# Patient Record
Sex: Female | Born: 1945 | Hispanic: No | Marital: Single | Smoking: Former smoker
Health system: Southern US, Community
[De-identification: ages and names within clinical notes are randomized; demographics above are authoritative.]

## PROBLEM LIST (undated history)

## (undated) DIAGNOSIS — F419 Anxiety disorder, unspecified: Secondary | ICD-10-CM

## (undated) DIAGNOSIS — I739 Peripheral vascular disease, unspecified: Secondary | ICD-10-CM

## (undated) DIAGNOSIS — M1990 Unspecified osteoarthritis, unspecified site: Secondary | ICD-10-CM

## (undated) DIAGNOSIS — R52 Pain, unspecified: Secondary | ICD-10-CM

## (undated) DIAGNOSIS — J449 Chronic obstructive pulmonary disease, unspecified: Secondary | ICD-10-CM

## (undated) DIAGNOSIS — R011 Cardiac murmur, unspecified: Secondary | ICD-10-CM

## (undated) DIAGNOSIS — Z87442 Personal history of urinary calculi: Secondary | ICD-10-CM

## (undated) DIAGNOSIS — G709 Myoneural disorder, unspecified: Secondary | ICD-10-CM

## (undated) DIAGNOSIS — D649 Anemia, unspecified: Secondary | ICD-10-CM

## (undated) DIAGNOSIS — Z5189 Encounter for other specified aftercare: Secondary | ICD-10-CM

## (undated) DIAGNOSIS — R51 Headache: Secondary | ICD-10-CM

## (undated) DIAGNOSIS — K316 Fistula of stomach and duodenum: Secondary | ICD-10-CM

## (undated) DIAGNOSIS — J189 Pneumonia, unspecified organism: Secondary | ICD-10-CM

## (undated) DIAGNOSIS — M797 Fibromyalgia: Secondary | ICD-10-CM

## (undated) DIAGNOSIS — F32A Depression, unspecified: Secondary | ICD-10-CM

## (undated) DIAGNOSIS — M810 Age-related osteoporosis without current pathological fracture: Secondary | ICD-10-CM

## (undated) DIAGNOSIS — I2699 Other pulmonary embolism without acute cor pulmonale: Secondary | ICD-10-CM

## (undated) DIAGNOSIS — K219 Gastro-esophageal reflux disease without esophagitis: Secondary | ICD-10-CM

## (undated) DIAGNOSIS — J40 Bronchitis, not specified as acute or chronic: Secondary | ICD-10-CM

## (undated) DIAGNOSIS — F329 Major depressive disorder, single episode, unspecified: Secondary | ICD-10-CM

## (undated) DIAGNOSIS — T7840XA Allergy, unspecified, initial encounter: Secondary | ICD-10-CM

## (undated) HISTORY — PX: SPINE SURGERY: SHX786

## (undated) HISTORY — PX: OVARIAN CYST REMOVAL: SHX89

## (undated) HISTORY — DX: Major depressive disorder, single episode, unspecified: F32.9

## (undated) HISTORY — DX: Depression, unspecified: F32.A

## (undated) HISTORY — PX: EYE SURGERY: SHX253

## (undated) HISTORY — PX: OTHER SURGICAL HISTORY: SHX169

## (undated) HISTORY — DX: Anemia, unspecified: D64.9

## (undated) HISTORY — PX: POLYPECTOMY: SHX149

## (undated) HISTORY — PX: ABDOMINAL HYSTERECTOMY: SHX81

## (undated) HISTORY — DX: Anxiety disorder, unspecified: F41.9

## (undated) HISTORY — DX: Encounter for other specified aftercare: Z51.89

## (undated) HISTORY — PX: UPPER GASTROINTESTINAL ENDOSCOPY: SHX188

## (undated) HISTORY — PX: APPENDECTOMY: SHX54

## (undated) HISTORY — PX: COLONOSCOPY: SHX174

## (undated) HISTORY — DX: Myoneural disorder, unspecified: G70.9

## (undated) HISTORY — DX: Age-related osteoporosis without current pathological fracture: M81.0

## (undated) HISTORY — PX: HERNIA REPAIR: SHX51

## (undated) HISTORY — DX: Allergy, unspecified, initial encounter: T78.40XA

---

## 1998-11-21 ENCOUNTER — Ambulatory Visit (HOSPITAL_COMMUNITY): Admission: RE | Admit: 1998-11-21 | Discharge: 1998-11-21 | Payer: Self-pay | Admitting: Ophthalmology

## 1999-03-01 ENCOUNTER — Encounter: Admission: RE | Admit: 1999-03-01 | Discharge: 1999-03-01 | Payer: Self-pay | Admitting: Internal Medicine

## 1999-03-01 ENCOUNTER — Encounter: Payer: Self-pay | Admitting: Internal Medicine

## 2000-03-07 ENCOUNTER — Encounter: Admission: RE | Admit: 2000-03-07 | Discharge: 2000-03-07 | Payer: Self-pay | Admitting: Internal Medicine

## 2000-03-07 ENCOUNTER — Encounter: Payer: Self-pay | Admitting: Internal Medicine

## 2000-03-27 ENCOUNTER — Ambulatory Visit (HOSPITAL_COMMUNITY): Admission: RE | Admit: 2000-03-27 | Discharge: 2000-03-27 | Payer: Self-pay | Admitting: Orthopedic Surgery

## 2000-03-27 ENCOUNTER — Encounter: Payer: Self-pay | Admitting: Orthopedic Surgery

## 2001-03-12 ENCOUNTER — Encounter: Admission: RE | Admit: 2001-03-12 | Discharge: 2001-03-12 | Payer: Self-pay | Admitting: Occupational Medicine

## 2001-03-12 ENCOUNTER — Encounter: Payer: Self-pay | Admitting: Occupational Medicine

## 2002-03-13 ENCOUNTER — Encounter: Admission: RE | Admit: 2002-03-13 | Discharge: 2002-03-13 | Payer: Self-pay | Admitting: Occupational Medicine

## 2002-03-13 ENCOUNTER — Encounter: Payer: Self-pay | Admitting: Occupational Medicine

## 2002-05-26 ENCOUNTER — Ambulatory Visit (HOSPITAL_COMMUNITY): Admission: RE | Admit: 2002-05-26 | Discharge: 2002-05-26 | Payer: Self-pay | Admitting: *Deleted

## 2002-05-26 ENCOUNTER — Encounter (INDEPENDENT_AMBULATORY_CARE_PROVIDER_SITE_OTHER): Payer: Self-pay | Admitting: Specialist

## 2002-05-27 ENCOUNTER — Encounter: Payer: Self-pay | Admitting: Emergency Medicine

## 2002-05-27 ENCOUNTER — Emergency Department (HOSPITAL_COMMUNITY): Admission: EM | Admit: 2002-05-27 | Discharge: 2002-05-27 | Payer: Self-pay | Admitting: Emergency Medicine

## 2002-07-20 ENCOUNTER — Ambulatory Visit (HOSPITAL_COMMUNITY): Admission: RE | Admit: 2002-07-20 | Discharge: 2002-07-20 | Payer: Self-pay | Admitting: Internal Medicine

## 2002-07-20 ENCOUNTER — Encounter: Payer: Self-pay | Admitting: Internal Medicine

## 2003-05-26 ENCOUNTER — Encounter: Admission: RE | Admit: 2003-05-26 | Discharge: 2003-05-26 | Payer: Self-pay | Admitting: Occupational Medicine

## 2004-09-05 ENCOUNTER — Encounter: Admission: RE | Admit: 2004-09-05 | Discharge: 2004-09-05 | Payer: Self-pay | Admitting: Occupational Medicine

## 2004-10-05 ENCOUNTER — Encounter: Admission: RE | Admit: 2004-10-05 | Discharge: 2004-10-05 | Payer: Self-pay | Admitting: Internal Medicine

## 2004-11-17 ENCOUNTER — Ambulatory Visit (HOSPITAL_COMMUNITY): Admission: RE | Admit: 2004-11-17 | Discharge: 2004-11-18 | Payer: Self-pay | Admitting: Orthopaedic Surgery

## 2005-06-18 ENCOUNTER — Ambulatory Visit: Payer: Self-pay | Admitting: Gastroenterology

## 2006-01-02 ENCOUNTER — Encounter: Admission: RE | Admit: 2006-01-02 | Discharge: 2006-01-02 | Payer: Self-pay | Admitting: Occupational Medicine

## 2008-09-27 LAB — LIPID PANEL
Cholesterol: 204 mg/dL — AB (ref 0–200)
LDL Cholesterol: 82 mg/dL

## 2008-09-27 LAB — TSH: TSH: 2.26 u[IU]/mL (ref <=5.90)

## 2009-07-05 ENCOUNTER — Encounter (INDEPENDENT_AMBULATORY_CARE_PROVIDER_SITE_OTHER): Payer: Self-pay | Admitting: *Deleted

## 2009-08-24 ENCOUNTER — Encounter: Admission: RE | Admit: 2009-08-24 | Discharge: 2009-08-24 | Payer: Self-pay | Admitting: Family Medicine

## 2010-02-27 ENCOUNTER — Encounter: Admission: RE | Admit: 2010-02-27 | Discharge: 2010-02-27 | Payer: Self-pay | Admitting: Internal Medicine

## 2010-05-14 ENCOUNTER — Encounter: Payer: Self-pay | Admitting: Occupational Medicine

## 2010-05-23 NOTE — Letter (Signed)
Summary: Referral - not able to see patient  [facility]  [address] [place], [place] [address]   Phone: [phone]  Fax: [phone]    July 05, 2009        Dr. Elvina Sidle [address] [place], [place]  [address]           Re:   Cynthia Bowen DOB:  1945-10-25 MRN:   [id number]    Dear Dr. Milus Glazier:  Thank you for your kind referral of the above patient.  We have attempted to schedule the recommended procedure, colonoscopy, but have not been able to schedule because:  _x_ The patient was not available by phone and/or has not returned our calls.  ___ The patient declined to schedule the procedure at this time.  We appreciate the referral and hope that we will have the opportunity to treat this patient in the future.    Sincerely,    [facility] [phone]

## 2010-07-19 LAB — HEPATIC FUNCTION PANEL: AST: 64 U/L — AB (ref 13–35)

## 2010-07-19 LAB — BASIC METABOLIC PANEL: Potassium: 3.2 mmol/L — AB (ref 3.4–5.3)

## 2010-07-19 LAB — LIPID PANEL
Cholesterol: 156 mg/dL (ref 0–200)
LDL Cholesterol: 35 mg/dL

## 2010-09-08 NOTE — Op Note (Signed)
   NAME:  Cynthia Bowen, Cynthia Bowen                          ACCOUNT NO.:  [id number]   MEDICAL RECORD NO.:  [id number]                   PATIENT TYPE:  AMB   LOCATION:  ENDO                                 FACILITY:  [facility]   PHYSICIAN:  Georgiana Spinner, M.D.                 DATE OF BIRTH:  Jan 26, 1946   DATE OF PROCEDURE:  DATE OF DISCHARGE:                                 OPERATIVE REPORT   PROCEDURE:  Upper endoscopy.   INDICATIONS:  Abdominal pain.   ANESTHESIA:  Demerol 90, Versed 9 mg.   PROCEDURE:  With the patient mildly sedated, in the left lateral decubitus  position, the Olympus videoscopic endoscope was inserted through the mouth,  passed under direct vision, through the esophagus which showed changes of  esophagitis which were photographed and biopsied.  We entered into the  stomach, fundus, body, antrum, duodenal bulb, second portion of the duodenum  and all appeared normal.  From this point, the endoscope was slowly  withdrawn, taking circumferential views of the entire duodenal mucosal until  the endoscope had been pulled back into the stomach, placed in retroflexed  position.  The endoscope was then straightened and withdrawn taking several  views of the remaining gastric and esophageal mucosa.  The patient's vital  signs and pulse oximeter remained stable, patient tolerated the procedure  well, without apparent complications.   FINDINGS:  Unremarkable examination other than the esophagitis, await biopsy  report.  Patient will call me for results in 12 days as an outpatient.   Proceed to colonoscopy as planned.                                               Georgiana Spinner, M.D.    GMO/MEDQ  D:  05/26/2002  T:  05/26/2002  Job:  [id number]   cc:   Otho Darner, M.D.  [address]  [place]  [place] [address]  Fax: [phone]

## 2010-09-08 NOTE — Op Note (Signed)
   NAME:  Cynthia Bowen, Cynthia Bowen                          ACCOUNT NO.:  [id number]   MEDICAL RECORD NO.:  [id number]                   PATIENT TYPE:  AMB   LOCATION:  ENDO                                 FACILITY:  [facility]   PHYSICIAN:  Georgiana Spinner, M.D.                 DATE OF BIRTH:  1946/04/02   DATE OF PROCEDURE:  05/26/2002  DATE OF DISCHARGE:                                 OPERATIVE REPORT   PROCEDURE:  Colonoscopy.   INDICATIONS:  Colon polyps.   ANESTHESIA:  Demerol 10, Versed 1 mg.   PROCEDURE IN DETAIL:  With the patient mildly sedated in the left lateral  decubitus position, the Olympus videoscopic colonoscope was inserted into  the rectum and passed under direct vision to the cecum, identified by the  ileocecal valve and the appendiceal orifice, both of which were  photographed.  In the cecum was a small polyp which was photographed and  removed using hot snare cautery technique.  A second polyp was seen adjacent  to this which was removed by using a hot biopsy forceps technique, both at  setting of 20/20 blended current.  The endoscope was then withdrawn taking  several different views of the remaining colonic mucosa, stopping to examine  the hepatic flexure, at which point a larger polyp was seen, photographed;  and as we manipulated, it was noted that it was on a stalk.  Therefore, it  was removed using snare cautery technique at a setting of 20/20 blended  current.  There was good hemostasis with all, and this last polyp was  retrieved using the [place] endoscopy net polyp retriever.  The endoscope was  withdrawn, as noted, all the way to the rectum which appeared normal  indirect and showed hemorrhoids on retroflexed view.  The endoscope was  straightened and withdrawn.  The patient's vital signs and pulse oximeter  remained stable.  The patient tolerated the procedure well without apparent  complications.   FINDINGS:  Polyps as described above, two in the cecum and one in  the  ascending colon near the hepatic flexure, otherwise unremarkable examination  other than hemorrhoids.   PLAN:  I will have patient call me for results of biopsies and follow up  with me as an outpatient.                                                Georgiana Spinner, M.D.    GMO/MEDQ  D:  05/26/2002  T:  05/26/2002  Job:  [id number]   cc:   Otho Darner, M.D.  [address]  [place]  [place] [address]  Fax: [phone]

## 2010-09-08 NOTE — Op Note (Signed)
Cynthia Bowen, Cynthia Bowen                ACCOUNT NO.:  [id number]   MEDICAL RECORD NO.:  [id number]          PATIENT TYPE:  OIB   LOCATION:  2550                         FACILITY:  [facility]   PHYSICIAN:  Mark C. Ophelia Charter, M.D.    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   PROCEDURE:  C4-5 anterior cervical diskectomy and fusion, left iliac crest,  bone graft and plating.   SURGEON:  Mark C. Ophelia Charter, M.D.   ASSISTANT:  Lynford Citizen, RNFA.   ANESTHESIA:  General plus local.   ESTIMATED BLOOD LOSS:  100 cc.   DRAINS:  One Hemovac.   PROCEDURE:  After adequate general anesthesia and orotracheal intubation,  standard prepping with sterile metal stand at the head.  Thyroid sheets were  applied.  Sterile skin mark in the old incision, and Betadine Viadrape in  both areas.  The incision was made starting in the midline, continuing on  the left, using the old incision and then dissection was performed,  following the L plane down the longus colli muscle, extending proximally to  the prominent spur.  Cross-table C-arm was used to document this at the C4-5  level.  Diskectomy was performed.  A self-retaining Cloward blades were  placed right and left, smooth blades up and down.  __drilling________ back  to the posterior cortex was performed with the 12 mm drill.  Posterior  longitudinal ligament was taken down.  Operative microscope was used.  Dura  was visualized.  There were chunks of disk that were pushed back behind the  vertebral body above and below.  Dura was well decompressed.  A 14 mm plug  was harvested from the left iliac crest and impacted to _flush position__.  Initially, the graft was a little bit at an angle.  It was removed and then  replaced.   Countersunk slightly with good position.  A 34 plate was  selected, a Synthes gold titanium.  It was adjusted, checked in AP and  lateral, then removed, bent, replaced.  Checked AP and lateral.  Holes were  power-drilled, 14 mm cancellous screws inserted, and then locking screws.  After irrigation with saline solution, Hemovacs were placed through a  separate stab incision with an in-and-out technique.  Platysma closed with 3-  0 Vicryl, 4-0  Vicryl, subcuticular and skin closure.  The iliac grafts closed with 0  Vicryl on the fascia and 2-0 in the subcutaneous tissue, 4-0 subcuticular  closure, and tincture of Benzoin and Steri-Strips, 4x4s, soft cervical  collar, and transfer to the recovery room in stable condition.  Instrument  and needle counts were correct.       MCY/MEDQ  D:  11/17/2004  T:  11/17/2004  Job:  [id number]

## 2011-04-02 ENCOUNTER — Encounter: Payer: Self-pay | Admitting: Internal Medicine

## 2011-04-02 DIAGNOSIS — M1990 Unspecified osteoarthritis, unspecified site: Secondary | ICD-10-CM | POA: Insufficient documentation

## 2011-04-02 DIAGNOSIS — M5090 Cervical disc disorder, unspecified, unspecified cervical region: Secondary | ICD-10-CM

## 2011-04-02 DIAGNOSIS — K219 Gastro-esophageal reflux disease without esophagitis: Secondary | ICD-10-CM | POA: Insufficient documentation

## 2011-04-02 DIAGNOSIS — M797 Fibromyalgia: Secondary | ICD-10-CM | POA: Insufficient documentation

## 2011-04-02 DIAGNOSIS — F418 Other specified anxiety disorders: Secondary | ICD-10-CM | POA: Insufficient documentation

## 2011-04-02 DIAGNOSIS — F329 Major depressive disorder, single episode, unspecified: Secondary | ICD-10-CM

## 2011-04-02 DIAGNOSIS — E785 Hyperlipidemia, unspecified: Secondary | ICD-10-CM

## 2011-04-11 ENCOUNTER — Ambulatory Visit (INDEPENDENT_AMBULATORY_CARE_PROVIDER_SITE_OTHER): Payer: Federal, State, Local not specified - PPO | Admitting: Internal Medicine

## 2011-04-11 DIAGNOSIS — F341 Dysthymic disorder: Secondary | ICD-10-CM

## 2011-04-11 DIAGNOSIS — Z23 Encounter for immunization: Secondary | ICD-10-CM

## 2011-04-11 DIAGNOSIS — G4700 Insomnia, unspecified: Secondary | ICD-10-CM

## 2011-04-21 ENCOUNTER — Ambulatory Visit (INDEPENDENT_AMBULATORY_CARE_PROVIDER_SITE_OTHER): Payer: Federal, State, Local not specified - PPO

## 2011-04-21 DIAGNOSIS — M25579 Pain in unspecified ankle and joints of unspecified foot: Secondary | ICD-10-CM

## 2011-04-21 DIAGNOSIS — M7660 Achilles tendinitis, unspecified leg: Secondary | ICD-10-CM

## 2011-04-25 ENCOUNTER — Ambulatory Visit (INDEPENDENT_AMBULATORY_CARE_PROVIDER_SITE_OTHER): Payer: Federal, State, Local not specified - PPO

## 2011-04-25 DIAGNOSIS — R319 Hematuria, unspecified: Secondary | ICD-10-CM

## 2011-04-25 DIAGNOSIS — M779 Enthesopathy, unspecified: Secondary | ICD-10-CM

## 2011-05-10 ENCOUNTER — Ambulatory Visit (INDEPENDENT_AMBULATORY_CARE_PROVIDER_SITE_OTHER): Payer: Federal, State, Local not specified - PPO

## 2011-05-10 DIAGNOSIS — N390 Urinary tract infection, site not specified: Secondary | ICD-10-CM

## 2011-05-22 ENCOUNTER — Telehealth: Payer: Self-pay

## 2011-05-22 NOTE — Telephone Encounter (Signed)
Spoke with patient, needs refill on a medication from [facility] on [address], but doesn't know which one and pharmacy is telling her that they are waiting on [place].  Per her chart, we denied Wellbutrin, pt d/c'd in Dec., and rf'd prozac.  Clarified with pharmacy that Prozac was ready, and to take Wellbutrin off her profile.  Patient notified rx was ready.

## 2011-05-22 NOTE — Telephone Encounter (Signed)
,  [facility] PATIENT SAID THE PHARMACY HAVE QUESTIONS REGARDING HER MEDICATION. SHE IS NOT SURE WHY?  BEST PHONE [phone]. [facility] [address].

## 2011-06-12 ENCOUNTER — Encounter: Payer: Self-pay | Admitting: Family Medicine

## 2011-06-13 ENCOUNTER — Ambulatory Visit (INDEPENDENT_AMBULATORY_CARE_PROVIDER_SITE_OTHER): Payer: Federal, State, Local not specified - PPO | Admitting: Internal Medicine

## 2011-06-13 ENCOUNTER — Encounter: Payer: Self-pay | Admitting: Internal Medicine

## 2011-06-13 VITALS — BP 128/80 | HR 109 | Temp 99.2°F | Resp 16 | Ht 65.0 in | Wt 102.0 lb

## 2011-06-13 DIAGNOSIS — F329 Major depressive disorder, single episode, unspecified: Secondary | ICD-10-CM

## 2011-06-13 MED ORDER — FLUOXETINE HCL 20 MG PO CAPS: 20.0000 mg | ORAL_CAPSULE | Freq: Every day | ORAL | Status: DC

## 2011-06-13 NOTE — Progress Notes (Signed)
  Subjective:    Patient ID: Cynthia Bowen, female    DOB: 03-Jun-1945, 66 y.o.   MRN: [id number]  HPIReturns for followup of depression and irritability with insomnia. We Increased Prozac in  November and 40 prozac= vomiting. She returned to 20 mg as now much better with regard to her depression symptoms. Halcion even half will be hungover- now uses only When can fall asleep for 1-2 hours. Uses is seldom and does well. [employer] Has her enrolled in a wellness planning program and she is keeping her exercise log on the computer.  License back In July if all goes well although she is now accustomed to walking a lot and is a little anxious about going back to driving  Review of SystemsUTI January 2013 responded well to medicines and has no further voiding symptoms Post cataract Surgery and still has problems with glare at night    Objective:   Physical ExamVital signs within normal limits Neurological intact Affect and mood appropriate        Assessment & Plan:  Problem #1 depression Problem #2 insomnia  Will continue Prozac 20 #90 one refill May call if needs more Halcion She continues to avoid all alcohol Will followup in August for her labs and complete exam

## 2011-08-09 ENCOUNTER — Telehealth: Payer: Self-pay

## 2011-08-09 DIAGNOSIS — H9209 Otalgia, unspecified ear: Secondary | ICD-10-CM

## 2011-08-09 NOTE — Telephone Encounter (Signed)
Can you please do this referral?

## 2011-08-09 NOTE — Telephone Encounter (Signed)
PT REQUEST THE REFERRAL DR. Merla Riches OFFERED HER FOR A KNOT ON HER RIGHT EAR.  PLEASE MAKE ASAP - AS HER EAR SWELLS AND IS PAINFUL  CB [phone]

## 2011-08-11 NOTE — Telephone Encounter (Signed)
Called pt, phone is not accepting VM

## 2011-09-20 ENCOUNTER — Telehealth: Payer: Self-pay

## 2011-09-20 DIAGNOSIS — F329 Major depressive disorder, single episode, unspecified: Secondary | ICD-10-CM

## 2011-09-20 NOTE — Telephone Encounter (Signed)
PT REQUESTS REFILL ON DEPRESSION MEDICATION IS NOT SURE OF THE NAME   PLEASE ADVISE

## 2011-09-22 ENCOUNTER — Telehealth: Payer: Self-pay

## 2011-09-22 MED ORDER — FLUOXETINE HCL 20 MG PO CAPS: 20.0000 mg | ORAL_CAPSULE | Freq: Every day | ORAL | Status: DC

## 2011-09-22 NOTE — Telephone Encounter (Signed)
PT DOES NOT KNOW THE NAME OF HER DEPRESSION MEDICATION.  IS UNABLE TO CALL PHARMACY TO REQUEST REFILL  PLEASE CALL PATIENT.

## 2011-09-22 NOTE — Telephone Encounter (Signed)
Done and sent in 

## 2011-09-23 NOTE — Telephone Encounter (Signed)
Prozac was refilled 6/1

## 2011-09-23 NOTE — Telephone Encounter (Signed)
Left patient a message her med had been called in.

## 2011-09-23 NOTE — Telephone Encounter (Signed)
LMOM THAT RX WAS SENT IN 

## 2011-11-12 ENCOUNTER — Other Ambulatory Visit: Payer: Self-pay | Admitting: Internal Medicine

## 2011-11-23 ENCOUNTER — Ambulatory Visit (INDEPENDENT_AMBULATORY_CARE_PROVIDER_SITE_OTHER): Payer: Federal, State, Local not specified - PPO | Admitting: Family Medicine

## 2011-11-23 ENCOUNTER — Ambulatory Visit: Payer: Federal, State, Local not specified - PPO

## 2011-11-23 VITALS — BP 103/65 | HR 82 | Temp 98.8°F | Resp 18 | Ht 64.5 in | Wt 93.0 lb

## 2011-11-23 DIAGNOSIS — M545 Low back pain, unspecified: Secondary | ICD-10-CM

## 2011-11-23 DIAGNOSIS — M25539 Pain in unspecified wrist: Secondary | ICD-10-CM

## 2011-11-23 DIAGNOSIS — M2550 Pain in unspecified joint: Secondary | ICD-10-CM

## 2011-11-23 LAB — POCT URINALYSIS DIPSTICK
Glucose, UA: NEGATIVE
Ketones, UA: NEGATIVE
Nitrite, UA: NEGATIVE
Spec Grav, UA: 1.015
Urobilinogen, UA: 1

## 2011-11-23 LAB — COMPREHENSIVE METABOLIC PANEL
BUN: 7 mg/dL (ref 6–23)
CO2: 30 mEq/L (ref 19–32)
Creat: 0.83 mg/dL (ref 0.50–1.10)
Glucose, Bld: 85 mg/dL (ref 70–99)
Sodium: 136 mEq/L (ref 135–145)
Total Bilirubin: 0.6 mg/dL (ref 0.3–1.2)
Total Protein: 6.7 g/dL (ref 6.0–8.3)

## 2011-11-23 LAB — POCT UA - MICROSCOPIC ONLY
Casts, Ur, LPF, POC: NEGATIVE
Crystals, Ur, HPF, POC: NEGATIVE
Mucus, UA: POSITIVE

## 2011-11-23 LAB — POCT CBC
HCT, POC: 43 % (ref 37.7–47.9)
MCHC: 31.2 g/dL — AB (ref 31.8–35.4)
MCV: 94.4 fL (ref 80–97)
MID (cbc): 0.6 (ref 0–0.9)
POC Granulocyte: 5.4 (ref 2–6.9)
POC MID %: 7.4 %M (ref 0–12)
Platelet Count, POC: 431 10*3/uL — AB (ref 142–424)
RDW, POC: 14.7 %

## 2011-11-23 LAB — POCT SEDIMENTATION RATE: POCT SED RATE: 30 mm/hr — AB (ref 0–22)

## 2011-11-23 MED ORDER — MELOXICAM 7.5 MG PO TABS: 7.5000 mg | ORAL_TABLET | Freq: Every day | ORAL | Status: DC

## 2011-11-23 NOTE — Patient Instructions (Addendum)
Take Mobic (moloxicam) one daily with food for back and wrist  Can take tylenol 500 mg 2 pills twice daily if needed for pan  Try Miralax for your bowels.  One dose daily when needed for constipation.  Return if not better

## 2011-11-23 NOTE — Progress Notes (Signed)
Subjective:    Patient ID: Cynthia Bowen, female    DOB: [birth year], 66 y.o.   MRN: [id number]  HPI patient presents with bilateral hand numbness and pain. left hand is the worst. States pain with hands has been ongoing due to carpal tunnel, she has seen Dr Merla Riches only for this it bothers her all day, most every day. Tries to ignore the pain. Has had nerve studies with [facility] Dr Amanda Pea in the past. Patient also complains of low back pain getting worse recently states she has had lumbar surgery many years ago. Also states pain in other joints as well, including feet and toes. Patient also describes night pain and pain with changes of position. Patient denies alcohol use, quit 2 years ago. doesn't take any over-the-counter medicines could she's allergic to so many things.    Review of Systems     Objective:   Physical Exam left wrist painful with ROM decreased grip strength in left hand. Right hand is also little tender in the wrist but not like the left. No spine tenderness. She has tenderness bilaterally in the paraspinous muscles, but I see fairly wide out from the spine. Her abdomen was normal with a bowel sounds soft but I can feel stool in the colon no masses she does have some tenderness across the upper abdomen and right upper quadrant.       Assessment & Plan:  Assessment: Back pain( lumbago) Left wrist pain and arthritis History of carpal tunnel syndrome Constipation  Results for orders placed in visit on 11/23/11  POCT CBC      Component Value Range   WBC 8.3  4.6 - 10.2 K/uL   Lymph, poc 2.3  0.6 - 3.4   POC LYMPH PERCENT 27.4  10 - 50 %L   MID (cbc) 0.6  0 - 0.9   POC MID % 7.4  0 - 12 %M   POC Granulocyte 5.4  2 - 6.9   Granulocyte percent 65.2  37 - 80 %G   RBC 4.56  4.04 - 5.48 M/uL   Hemoglobin 13.4  12.2 - 16.2 g/dL   HCT, POC 40.9  81.1 - 47.9 %   MCV 94.4  80 - 97 fL   MCH, POC 29.4  27 - 31.2 pg   MCHC 31.2 (*) 31.8 - 35.4 g/dL   RDW,  POC 91.4     Platelet Count, POC 431 (*) 142 - 424 K/uL   MPV 8.0  0 - 99.8 fL  POCT UA - MICROSCOPIC ONLY      Component Value Range   WBC, Ur, HPF, POC 0-1     RBC, urine, microscopic 3-5     Bacteria, U Microscopic negative     Mucus, UA positive     Epithelial cells, urine per micros 0-1     Crystals, Ur, HPF, POC negative     Casts, Ur, LPF, POC negative     Yeast, UA negative    POCT URINALYSIS DIPSTICK      Component Value Range   Color, UA yellow     Clarity, UA clear     Glucose, UA negative     Bilirubin, UA small     Ketones, UA negative     Spec Grav, UA 1.015     Blood, UA small     pH, UA 6.5     Protein, UA trace     Urobilinogen, UA 1.0     Nitrite, UA negative  Leukocytes, UA Negative     [facility] reading (PRIMARY) by  Dr. Alwyn Ren No significant arthritic changes noted.  No fx.  Impression: Tendonitis Back pain  MObic 7.5 qd Wrist splint left miralax for bowels--see if it helps her back pains.  Return if not improving.

## 2011-11-28 ENCOUNTER — Encounter: Payer: Federal, State, Local not specified - PPO | Admitting: Internal Medicine

## 2011-12-05 ENCOUNTER — Encounter: Payer: Federal, State, Local not specified - PPO | Admitting: Internal Medicine

## 2011-12-23 DIAGNOSIS — J189 Pneumonia, unspecified organism: Secondary | ICD-10-CM

## 2011-12-23 HISTORY — DX: Pneumonia, unspecified organism: J18.9

## 2011-12-29 ENCOUNTER — Emergency Department (HOSPITAL_COMMUNITY): Payer: Federal, State, Local not specified - PPO

## 2011-12-29 ENCOUNTER — Other Ambulatory Visit: Payer: Self-pay

## 2011-12-29 ENCOUNTER — Ambulatory Visit (INDEPENDENT_AMBULATORY_CARE_PROVIDER_SITE_OTHER): Payer: Federal, State, Local not specified - PPO | Admitting: Emergency Medicine

## 2011-12-29 ENCOUNTER — Emergency Department (HOSPITAL_COMMUNITY)
Admission: EM | Admit: 2011-12-29 | Discharge: 2011-12-29 | Disposition: A | Discharge status: Home / Self Care | Payer: Federal, State, Local not specified - PPO | Attending: Emergency Medicine | Admitting: Emergency Medicine

## 2011-12-29 ENCOUNTER — Encounter (HOSPITAL_COMMUNITY): Payer: Self-pay

## 2011-12-29 VITALS — BP 90/60 | HR 83 | Temp 98.1°F | Resp 18 | Ht 64.0 in | Wt 92.0 lb

## 2011-12-29 DIAGNOSIS — R0602 Shortness of breath: Secondary | ICD-10-CM

## 2011-12-29 DIAGNOSIS — R110 Nausea: Secondary | ICD-10-CM

## 2011-12-29 DIAGNOSIS — R109 Unspecified abdominal pain: Secondary | ICD-10-CM

## 2011-12-29 DIAGNOSIS — R1084 Generalized abdominal pain: Secondary | ICD-10-CM

## 2011-12-29 DIAGNOSIS — Z9089 Acquired absence of other organs: Secondary | ICD-10-CM | POA: Insufficient documentation

## 2011-12-29 DIAGNOSIS — I959 Hypotension, unspecified: Secondary | ICD-10-CM

## 2011-12-29 DIAGNOSIS — Z87891 Personal history of nicotine dependence: Secondary | ICD-10-CM | POA: Insufficient documentation

## 2011-12-29 DIAGNOSIS — M549 Dorsalgia, unspecified: Secondary | ICD-10-CM | POA: Diagnosis present

## 2011-12-29 DIAGNOSIS — J189 Pneumonia, unspecified organism: Secondary | ICD-10-CM | POA: Diagnosis present

## 2011-12-29 LAB — CBC WITH DIFFERENTIAL/PLATELET
Basophils Absolute: 0 10*3/uL (ref 0.0–0.1)
Basophils Relative: 1 % (ref 0–1)
Eosinophils Absolute: 0 K/uL (ref 0.0–0.7)
Eosinophils Relative: 0 % (ref 0–5)
HCT: 38 % (ref 36.0–46.0)
Hemoglobin: 13.2 g/dL (ref 12.0–15.0)
Lymphocytes Relative: 23 % (ref 12–46)
Lymphs Abs: 1.5 K/uL (ref 0.7–4.0)
MCH: 30.5 pg (ref 26.0–34.0)
MCHC: 34.7 g/dL (ref 30.0–36.0)
MCV: 87.8 fL (ref 78.0–100.0)
Monocytes Absolute: 0.6 K/uL (ref 0.1–1.0)
Monocytes Relative: 9 % (ref 3–12)
Neutro Abs: 4.5 10*3/uL (ref 1.7–7.7)
Neutrophils Relative %: 67 % (ref 43–77)
Platelets: 263 10*3/uL (ref 150–400)
RBC: 4.33 MIL/uL (ref 3.87–5.11)
RDW: 14.7 % (ref 11.5–15.5)
WBC: 6.6 10*3/uL (ref 4.0–10.5)

## 2011-12-29 LAB — POCT CBC
MCHC: 31.6 g/dL — AB (ref 31.8–35.4)
MID (cbc): 0.3 (ref 0–0.9)
MPV: 8.9 fL (ref 0–99.8)
RDW, POC: 14.9 %
WBC: 5.3 10*3/uL (ref 4.6–10.2)

## 2011-12-29 LAB — URINALYSIS, ROUTINE W REFLEX MICROSCOPIC
Bilirubin Urine: NEGATIVE
Glucose, UA: NEGATIVE mg/dL
Hgb urine dipstick: NEGATIVE
Ketones, ur: NEGATIVE mg/dL
Leukocytes, UA: NEGATIVE
Nitrite: NEGATIVE
Protein, ur: NEGATIVE mg/dL
Specific Gravity, Urine: 1.015 (ref 1.005–1.030)
Urobilinogen, UA: 0.2 mg/dL (ref 0.0–1.0)
pH: 6.5 (ref 5.0–8.0)

## 2011-12-29 LAB — BASIC METABOLIC PANEL
CO2: 26 mEq/L (ref 19–32)
Chloride: 102 mEq/L (ref 96–112)
Creatinine, Ser: 0.7 mg/dL (ref 0.50–1.10)
GFR calc Af Amer: >90 mL/min (ref >90)
Potassium: 3.2 mEq/L — ABNORMAL LOW (ref 3.5–5.1)
Sodium: 138 mEq/L (ref 135–145)

## 2011-12-29 LAB — BASIC METABOLIC PANEL WITH GFR
BUN: 8 mg/dL (ref 6–23)
Calcium: 9.4 mg/dL (ref 8.4–10.5)
GFR calc non Af Amer: 90 mL/min — ABNORMAL LOW (ref >90)
Glucose, Bld: 80 mg/dL (ref 70–99)

## 2011-12-29 LAB — TROPONIN I: Troponin I: <0.3 ng/mL (ref <0.30)

## 2011-12-29 MED ORDER — MOXIFLOXACIN HCL 400 MG PO TABS: 400.0000 mg | ORAL_TABLET | Freq: Every day | ORAL | Status: AC

## 2011-12-29 MED ORDER — TRAMADOL HCL 50 MG PO TABS
50.0000 mg | ORAL_TABLET | Freq: Once | ORAL | Status: AC
  Administered 2011-12-29: 50 mg via ORAL
  Filled 2011-12-29: qty 1

## 2011-12-29 MED ORDER — MOXIFLOXACIN HCL 400 MG PO TABS
400.0000 mg | ORAL_TABLET | Freq: Once | ORAL | Status: AC
  Administered 2011-12-29: 400 mg via ORAL
  Filled 2011-12-29: qty 1

## 2011-12-29 MED ORDER — MORPHINE SULFATE 4 MG/ML IJ SOLN
4.0000 mg | Freq: Once | INTRAMUSCULAR | Status: AC
  Administered 2011-12-29: 4 mg via INTRAVENOUS
  Filled 2011-12-29: qty 1

## 2011-12-29 MED ORDER — TRAMADOL HCL 50 MG PO TABS: 50.0000 mg | ORAL_TABLET | Freq: Four times a day (QID) | ORAL | Status: AC | PRN

## 2011-12-29 MED ORDER — ONDANSETRON HCL 4 MG/2ML IJ SOLN
4.0000 mg | Freq: Once | INTRAMUSCULAR | Status: AC
  Administered 2011-12-29: 4 mg via INTRAVENOUS
  Filled 2011-12-29: qty 2

## 2011-12-29 NOTE — ED Notes (Signed)
Prescriptions x2 given with discharge instructions.  

## 2011-12-29 NOTE — ED Provider Notes (Signed)
History     CSN: [id number]  Arrival date & time 12/29/11  1924   First MD Initiated Contact with Patient 12/29/11 1955      Chief Complaint  Patient presents with  . Chest Pain  . Back Pain    (Consider location/radiation/quality/duration/timing/severity/associated sxs/prior treatment) Patient is a 66 y.o. female presenting with back pain. The history is provided by the patient.  Back Pain  This is a new problem. The current episode started yesterday. The problem occurs constantly. The problem has not changed since onset.The pain is associated with no known injury. The pain is present in the thoracic spine and lumbar spine. Quality: throbbing. Radiates to: lateral ribs. The pain is at a severity of 8/10. The pain is moderate. The symptoms are aggravated by certain positions. The pain is the same all the time. Pertinent negatives include no chest pain, no fever, no headaches, no abdominal pain and no dysuria. She has tried NSAIDs for the symptoms. The treatment provided mild relief.    History reviewed. No pertinent past medical history.  Past Surgical History  Procedure Date  . Abdominal hysterectomy   . Cholecystectomy     No family history on file.  History  Substance Use Topics  . Smoking status: Former Games developer  . Smokeless tobacco: Not on file  . Alcohol Use: No    OB History    Grav Para Term Preterm Abortions TAB SAB Ect Mult Living                  Review of Systems  Constitutional: Negative for fever and fatigue.  HENT: Negative for congestion, drooling and neck pain.   Eyes: Negative for pain.  Respiratory: Negative for cough and shortness of breath.   Cardiovascular: Negative for chest pain.  Gastrointestinal: Positive for nausea. Negative for vomiting, abdominal pain and diarrhea.  Genitourinary: Negative for dysuria and hematuria.  Musculoskeletal: Positive for back pain. Negative for gait problem.  Skin: Negative for color change.  Neurological:  Positive for dizziness (mild). Negative for headaches.  Hematological: Negative for adenopathy.  Psychiatric/Behavioral: Negative for behavioral problems.  All other systems reviewed and are negative.    Allergies  Alendronate sodium; Aspirin; Codeine; Doxycycline; Fluconazole; Neurontin; Sertraline hcl; and Sulfa antibiotics  Home Medications   Current Outpatient Rx  Name Route Sig Dispense Refill  . FLUOXETINE HCL 20 MG PO CAPS Oral Take 20 mg by mouth daily.    . MELOXICAM 7.5 MG PO TABS Oral Take 7.5 mg by mouth daily.    Marland Kitchen ONE-DAILY MULTI VITAMINS PO TABS Oral Take 1 tablet by mouth daily.    Marland Kitchen PANTOPRAZOLE SODIUM 40 MG PO TBEC Oral Take 40 mg by mouth daily.    Marland Kitchen TRIAZOLAM 0.25 MG PO TABS Oral Take 0.25 mg by mouth at bedtime as needed. For sleep.    Marland Kitchen MOXIFLOXACIN HCL 400 MG PO TABS Oral Take 1 tablet (400 mg total) by mouth daily. 7 tablet 0  . TRAMADOL HCL 50 MG PO TABS Oral Take 1 tablet (50 mg total) by mouth every 6 (six) hours as needed for pain. 15 tablet 0    BP 129/83  Pulse 77  Temp 98.8 F (37.1 C) (Oral)  Resp 22  SpO2 100%  Physical Exam  Nursing note and vitals reviewed. Constitutional: She is oriented to person, place, and time. She appears well-developed and well-nourished.  HENT:  Head: Normocephalic.  Mouth/Throat: No oropharyngeal exudate.  Eyes: Conjunctivae and EOM are normal. Pupils are  equal, round, and reactive to light.  Neck: Normal range of motion. Neck supple.  Cardiovascular: Normal rate, regular rhythm, normal heart sounds and intact distal pulses.  Exam reveals no gallop and no friction rub.   No murmur heard. Pulmonary/Chest: Effort normal and breath sounds normal. No respiratory distress. She has no wheezes. She exhibits tenderness (pain reproduced w/ lateral comp of chest).  Abdominal: Soft. Bowel sounds are normal. There is no tenderness. There is no rebound and no guarding.  Musculoskeletal: Normal range of motion. She exhibits  tenderness (mild to mod ttp of paraspinal lumbar area). She exhibits no edema.  Neurological: She is alert and oriented to person, place, and time.  Skin: Skin is warm and dry.  Psychiatric: She has a normal mood and affect. Her behavior is normal.    ED Course  Procedures (including critical care time)  Labs Reviewed  BASIC METABOLIC PANEL - Abnormal; Notable for the following:    Potassium 3.2 (*)     GFR calc non Af Amer 90 (*)     All other components within normal limits  CBC WITH DIFFERENTIAL  TROPONIN I  URINALYSIS, ROUTINE W REFLEX MICROSCOPIC  URINE CULTURE   Dg Chest Port 1 View  12/29/2011  *RADIOLOGY REPORT*  Clinical Data: 66 year old female with shortness of breath cough chest pain back pain and congestion.  PORTABLE CHEST - 1 VIEW  Comparison: 11/14/2004.  Findings: Portable upright AP view 2046 hours.  Stable lung volumes.  Cardiac size appears increased and mildly abnormal. Other mediastinal contours are within normal limits.  Visualized tracheal air column is within normal limits.  The upper lobes are clear.  No pneumothorax or effusion.  Asymmetric reticulonodular opacity at the right lung base new since previous.  Mild gaseous distention of the stomach.  IMPRESSION: 1.  Findings suspicious for developing pneumonia at the right lung base. 2.  Cardiac enlargement and 2006.   Original Report Authenticated By: Ulla Potash III, M.D.      1. Back pain   2. Community acquired pneumonia      Date: 12/29/2011  Rate: 69  Rhythm: normal sinus rhythm  QRS Axis: normal  Intervals: normal  ST/T Wave abnormalities: normal  Conduction Disutrbances:none  Narrative Interpretation: No ST or T wave changes cw ischemia  Old EKG Reviewed: none available    MDM  11:08 PM 66 y.o. female w hx of HLP, fibromyalgia, previous smoker pw back pain radiating around to her lateral ribs. Pt notes her pain began yesterday evening spontaneously and describes it as a throbbing pain which is  reproducible w/ lat comp of chest on exam. Pt AFVSS here, appears well on exam. Pain is atpyical, possibly MSK. Pt is Wells neg, low suspicion for PE/MI. Will get screening labs, pain control.   11:08 PM: Pt continues to appear well on exam.  CXR w/ evidence of pna, will give avelox here, tramadol for pain. Suspect pt's pain assoc w/ pna.  I have discussed the diagnosis/risks/treatment options with the patient and believe the pt to be eligible for discharge home to follow-up with pcp in 2-3 days if no better. We also discussed returning to the ED immediately if new or worsening sx occur. We discussed the sx which are most concerning (e.g., worsening pain, sob) that necessitate immediate return. Any new prescriptions provided to the patient are listed below.  New Prescriptions   MOXIFLOXACIN (AVELOX) 400 MG TABLET    Take 1 tablet (400 mg total) by mouth daily.  TRAMADOL (ULTRAM) 50 MG TABLET    Take 1 tablet (50 mg total) by mouth every 6 (six) hours as needed for pain.   Clinical Impression 1. Back pain   2. Community acquired pneumonia      Purvis Sheffield, MD 12/29/11 2308

## 2011-12-29 NOTE — ED Notes (Signed)
Per EMS, pt has pain starting in back and radiating around to chest. Also c/o nausea. Hx of back surgery.

## 2011-12-29 NOTE — Progress Notes (Signed)
  Subjective:    Patient ID: Cynthia Bowen, female    DOB: 31-May-1945, 66 y.o.   MRN: [id number]  HPI patient enters with severe pain in her flank area. She's had pain which starts in her flank and mid back and radiates around to the mid abdomen. She has been extremely nauseated since yesterday she has not actually vomited but continues to have severe midepigastric abdominal pain associated with nausea. She denies any true chest pain. She denies dyspnea on exertion she was a drinker in the past but not for a number of years. She is a nonsmoker.    Review of Systems     Objective:   Physical Exam  Constitutional:       Patient appears ill but not toxic and is in no respiratory distress  HENT:  Head: Normocephalic and atraumatic.  Neck: No tracheal deviation present. No thyromegaly present.  Cardiovascular: Normal rate and regular rhythm.   Pulmonary/Chest: Effort normal.       There were decreased breath sounds on the right compared to left but I do not hear any wheezes .  Abdominal:       She does have bowel sounds. There is significant tenderness midepigastrium which extends into both upper quadrants. The lower abdomen is nontender.   EKG normal sinus rhythm. QS pattern in V1 and V2 no acute changes are seen.       Assessment & Plan:  Patient presents with blood pressure of 90 systolic and episodes of nausea with extreme dizziness. She is a 20 history of severe flank pain and midepigastric abdominal pain. She has had significant nausea not associated with any fever or chills. IV to be started along with an EKG to be done we'll try to check his sugar and glucose on the

## 2011-12-30 NOTE — ED Provider Notes (Signed)
I saw and evaluated the patient, reviewed the resident's note and I agree with the findings and plan.  I reviewed and agree with ECG interpretation by resident physician.  Pt with atypical CP, cough, musculoskeletal, reproducible CP and back pain.  With cough and CXR suggestive of early pneumonia, no need for cardiac.  ECG shows no acute ischemia.  Abx begun, doesn't meet criteria for admission, pain controllable, not hypoxic, will treat as outpt CAP.    Gavin Pound. Oletta Lamas, MD 12/30/11 1610

## 2011-12-31 LAB — URINE CULTURE: Colony Count: 2000

## 2012-01-01 ENCOUNTER — Ambulatory Visit (INDEPENDENT_AMBULATORY_CARE_PROVIDER_SITE_OTHER): Payer: Federal, State, Local not specified - PPO | Admitting: Family Medicine

## 2012-01-01 ENCOUNTER — Ambulatory Visit: Payer: Federal, State, Local not specified - PPO

## 2012-01-01 VITALS — BP 118/70 | HR 64 | Temp 98.3°F | Resp 16 | Ht 64.0 in | Wt 95.0 lb

## 2012-01-01 DIAGNOSIS — J189 Pneumonia, unspecified organism: Secondary | ICD-10-CM

## 2012-01-01 DIAGNOSIS — E876 Hypokalemia: Secondary | ICD-10-CM

## 2012-01-01 LAB — POCT CBC
Granulocyte percent: 53.9 %G (ref 37–80)
HCT, POC: 41 % (ref 37.7–47.9)
Hemoglobin: 12.8 g/dL (ref 12.2–16.2)
Lymph, poc: 2.4 (ref 0.6–3.4)
MCH, POC: 29.3 pg (ref 27–31.2)
MCHC: 31.2 g/dL — AB (ref 31.8–35.4)
MCV: 93.9 fL (ref 80–97)
MID (cbc): 0.4 (ref 0–0.9)
MPV: 8.6 fL (ref 0–99.8)
POC Granulocyte: 3.2 (ref 2–6.9)
POC LYMPH PERCENT: 39.3 %L (ref 10–50)
POC MID %: 6.8 %M (ref 0–12)
Platelet Count, POC: 227 10*3/uL (ref 142–424)
RBC: 4.37 M/uL (ref 4.04–5.48)
RDW, POC: 16 %
WBC: 6 10*3/uL (ref 4.6–10.2)

## 2012-01-01 LAB — POCT URINALYSIS DIPSTICK
Bilirubin, UA: NEGATIVE
Glucose, UA: NEGATIVE
Ketones, UA: NEGATIVE
Leukocytes, UA: NEGATIVE
Nitrite, UA: NEGATIVE
Protein, UA: NEGATIVE
Spec Grav, UA: 1.015
Urobilinogen, UA: 0.2
pH, UA: 6

## 2012-01-01 MED ORDER — POTASSIUM CHLORIDE CRYS ER 10 MEQ PO TBCR: 10.0000 meq | EXTENDED_RELEASE_TABLET | Freq: Two times a day (BID) | ORAL | Status: DC

## 2012-01-01 MED ORDER — CEFTRIAXONE SODIUM 1 G IJ SOLR: 1.0000 g | INTRAMUSCULAR | Status: DC

## 2012-01-01 MED ORDER — IPRATROPIUM BROMIDE 0.02 % IN SOLN
0.5000 mg | Freq: Once | RESPIRATORY_TRACT | Status: AC
  Administered 2012-01-01: 0.5 mg via RESPIRATORY_TRACT

## 2012-01-01 MED ORDER — ALBUTEROL SULFATE (2.5 MG/3ML) 0.083% IN NEBU
2.5000 mg | INHALATION_SOLUTION | Freq: Once | RESPIRATORY_TRACT | Status: AC
  Administered 2012-01-01: 2.5 mg via RESPIRATORY_TRACT

## 2012-01-01 MED ORDER — CEFTRIAXONE SODIUM 1 G IJ SOLR
1.0000 g | Freq: Once | INTRAMUSCULAR | Status: AC
  Administered 2012-01-01: 1 g via INTRAMUSCULAR

## 2012-01-01 MED ORDER — METHYLPREDNISOLONE ACETATE 80 MG/ML IJ SUSP
80.0000 mg | Freq: Once | INTRAMUSCULAR | Status: AC
  Administered 2012-01-01: 80 mg via INTRAMUSCULAR

## 2012-01-01 MED ORDER — ALBUTEROL SULFATE (5 MG/ML) 0.5% IN NEBU: 2.5000 mg | INHALATION_SOLUTION | RESPIRATORY_TRACT | Status: DC

## 2012-01-01 MED ORDER — IPRATROPIUM BROMIDE 0.02 % IN SOLN: 0.5000 mg | RESPIRATORY_TRACT | Status: DC

## 2012-01-01 MED ORDER — METHYLPREDNISOLONE ACETATE 80 MG/ML IJ SUSP: 80.0000 mg | Freq: Once | INTRAMUSCULAR | Status: DC

## 2012-01-01 NOTE — Progress Notes (Signed)
66 yo woman with recently diagnosed pneumonia, sent to hospital by ambulance but then sent home on Avelox.  She also has severe lower circumferential chest pains.  Appetite is poor.  Has quit smoking in 2004,    She has been vomiting up her Avelox since  Discharge from the emergency department.  She is dyspneic and very weak. Notes persistent  orthopnea No polyuria,   Objective:  Emaciated woman with dyspnea wiith even slight exertion and audible wheezes, alert and appropriate HEENT:  Edentulous, otherwise negative Neck: using accessory breathing muscles Chest:  Rales right base, tender right posterior chest Heart:  Regular, rate about 60, no murmur  Patient walked in office and pulse ox stayed over 95% [facility] reading (PRIMARY) by  Dr. Milus Glazier. CXR:  COPD, unusual vertical left mediastinal markings, bilateral mediastinal adenopathy  Results for orders placed in visit on 01/01/12  POCT CBC      Component Value Range   WBC 6.0  4.6 - 10.2 K/uL   Lymph, poc 2.4  0.6 - 3.4   POC LYMPH PERCENT 39.3  10 - 50 %L   MID (cbc) 0.4  0 - 0.9   POC MID % 6.8  0 - 12 %M   POC Granulocyte 3.2  2 - 6.9   Granulocyte percent 53.9  37 - 80 %G   RBC 4.37  4.04 - 5.48 M/uL   Hemoglobin 12.8  12.2 - 16.2 g/dL   HCT, POC 40.9  81.1 - 47.9 %   MCV 93.9  80 - 97 fL   MCH, POC 29.3  27 - 31.2 pg   MCHC 31.2 (*) 31.8 - 35.4 g/dL   RDW, POC 91.4     Platelet Count, POC 227  142 - 424 K/uL   MPV 8.6  0 - 99.8 fL  POCT URINALYSIS DIPSTICK      Component Value Range   Color, UA yellow     Clarity, UA clear     Glucose, UA neg     Bilirubin, UA neg     Ketones, UA neg     Spec Grav, UA 1.015     Blood, UA trace     pH, UA 6.0     Protein, UA neg     Urobilinogen, UA 0.2     Nitrite, UA neg     Leukocytes, UA Negative     Assessment:  Physical exam findings consistent with right lower lobe pneumonia in woman with obvious COPD

## 2012-01-01 NOTE — Patient Instructions (Addendum)
You need to return on Friday for recheck

## 2012-01-02 LAB — COMPREHENSIVE METABOLIC PANEL
ALT: 11 U/L (ref 0–35)
AST: 21 U/L (ref 0–37)
Albumin: 4.2 g/dL (ref 3.5–5.2)
Alkaline Phosphatase: 76 U/L (ref 39–117)
BUN: 8 mg/dL (ref 6–23)
CO2: 29 mEq/L (ref 19–32)
Calcium: 9.5 mg/dL (ref 8.4–10.5)
Chloride: 103 mEq/L (ref 96–112)
Creat: 0.72 mg/dL (ref 0.50–1.10)
Glucose, Bld: 113 mg/dL — ABNORMAL HIGH (ref 70–99)
Potassium: 3.8 mEq/L (ref 3.5–5.3)
Sodium: 137 mEq/L (ref 135–145)
Total Bilirubin: 0.4 mg/dL (ref 0.3–1.2)
Total Protein: 7.6 g/dL (ref 6.0–8.3)

## 2012-01-04 ENCOUNTER — Ambulatory Visit (INDEPENDENT_AMBULATORY_CARE_PROVIDER_SITE_OTHER): Payer: Federal, State, Local not specified - PPO | Admitting: Family Medicine

## 2012-01-04 VITALS — BP 95/59 | HR 92 | Temp 98.3°F | Resp 20 | Ht 64.0 in | Wt 91.0 lb

## 2012-01-04 DIAGNOSIS — J441 Chronic obstructive pulmonary disease with (acute) exacerbation: Secondary | ICD-10-CM

## 2012-01-04 MED ORDER — MOMETASONE FURO-FORMOTEROL FUM 200-5 MCG/ACT IN AERO: 2.0000 | INHALATION_SPRAY | Freq: Two times a day (BID) | RESPIRATORY_TRACT | Status: DC

## 2012-01-04 NOTE — Progress Notes (Signed)
66 yo with recent COPD exacerbation.  She had a clinical pneumonia with right lower posterior rales, but now is breathing much better.  Her cough persists but wheezing has largely cleared.  Sleeping okay.  Appetite is still poor, but is chronically poor.     No fevers, mild DOE persists but she is close to her premorbid respiratory condition.  Objective:  Frail elderly woman who is alert and appropriate.  HEENT:  Unremarkable Chest:  Few ronchi, no  Rales, no tachypnea Heart:  I/VI SEM, no gallop Ext: no edema  Neck: no JVD  Assessment: elderly frail woman recovering from COPD exacerbation c/w RLL pneumonia. 1. COPD exacerbation  Mometasone Furo-Formoterol Fum 200-5 MCG/ACT AERO

## 2012-01-22 ENCOUNTER — Ambulatory Visit: Payer: Federal, State, Local not specified - PPO

## 2012-01-22 ENCOUNTER — Ambulatory Visit (INDEPENDENT_AMBULATORY_CARE_PROVIDER_SITE_OTHER): Payer: Federal, State, Local not specified - PPO | Admitting: Internal Medicine

## 2012-01-22 VITALS — BP 130/60 | HR 71 | Temp 98.3°F | Resp 18 | Ht 64.5 in | Wt 93.0 lb

## 2012-01-22 DIAGNOSIS — R05 Cough: Secondary | ICD-10-CM

## 2012-01-22 DIAGNOSIS — J449 Chronic obstructive pulmonary disease, unspecified: Secondary | ICD-10-CM

## 2012-01-22 DIAGNOSIS — J4489 Other specified chronic obstructive pulmonary disease: Secondary | ICD-10-CM

## 2012-01-22 DIAGNOSIS — J189 Pneumonia, unspecified organism: Secondary | ICD-10-CM

## 2012-01-22 DIAGNOSIS — R059 Cough, unspecified: Secondary | ICD-10-CM

## 2012-01-22 NOTE — Progress Notes (Signed)
  Subjective:    Patient ID: Cynthia Bowen, female    DOB: 30-Jan-1946, 66 y.o.   MRN: [id number]  HPIstill coughing and has some sob with all activities//cxr in ER ? RLL Pneu/here 1 week later=only chr changes. Cough nonprod No fever Continues on dulera  For 3-4 months has been unable to blow out hard enough to start car(has to clear interlock) Wants note to be able to change requirements DMV requires PFTs from 2 MDs  - has stopped all ETOH after the DWI arrest 03/31/10 with subseq education classes/ no other drugs either -former smoker   Review of Systems No fever chills night sweats or weight loss No sinus congestion Appetite good Reflux controlled by Protonix No headaches No depression or anxiety/Continues on Prozac and halcion Arthritis better with Mobic    Objective:   Physical Exam Filed Vitals:   01/22/12 1106  BP: 130/60  Pulse: 71  Temp: 98.3 F (36.8 C)  Resp: 18   No acute distress HEENT clear Lungs clear except rhonchi both bases are clear with coughing Heart regular without murmur Peripheral edema   [facility] reading (PRIMARY) by  Dr.Stefan Karen=Pleural effusion, infiltrate, or consolidation.  Pulmonary function studies show mild obstruction with FEV1 of 1.7 L    Assessment & Plan:  Problem #1 COPD Problem #2 prolonged cough Problem #3 former smoker Problem #4 GERD Problem #5 depression Problem #6 osteoarthritis Problem #7 chronic neck and back pain/disc disease  DMV form copl FA:OZHY ab DMV form compl re: PFTs Refer to [facility] for 2nd evaluation

## 2012-01-29 ENCOUNTER — Encounter: Payer: Self-pay | Admitting: Internal Medicine

## 2012-01-29 ENCOUNTER — Ambulatory Visit (INDEPENDENT_AMBULATORY_CARE_PROVIDER_SITE_OTHER): Payer: Federal, State, Local not specified - PPO | Admitting: Internal Medicine

## 2012-01-29 VITALS — BP 124/66 | HR 77 | Temp 97.6°F | Ht 64.5 in | Wt 92.4 lb

## 2012-01-29 DIAGNOSIS — J449 Chronic obstructive pulmonary disease, unspecified: Secondary | ICD-10-CM

## 2012-01-29 DIAGNOSIS — R0600 Dyspnea, unspecified: Secondary | ICD-10-CM

## 2012-01-29 DIAGNOSIS — R0609 Other forms of dyspnea: Secondary | ICD-10-CM

## 2012-01-29 NOTE — Progress Notes (Signed)
  Subjective:    Patient ID: Cynthia Bowen, female    DOB: 1946/02/01  MRN: [id number]  HPI  66 yobf quit smoking 2004 with cough that completely and good ex tolerance then dx pna Sept 2013 and referred 01/29/2012 to pulmonary clinic by Dr Merla Riches for persistent cough/sob.   01/29/2012 1st pulmonary eval cc not limited by breathing then developed outpt pneumonia and since then doe x [place] and cough is still productive white, assoc with  fatigue. Feels throat burning and sensation of retained secretions but actual sputum production minimal and some better p durera. No obvious daytime variabilty or assoc  cp or chest tightness, subjective wheeze overt sinus or hb symptoms. No unusual exp hx or h/o childhood pna/ asthma or premature birth to her knowledge.      Sleeping in recliner due t0 nocturnal  or early am exacerbation  of respiratory  c/o's or need for noct saba. Also denies any obvious fluctuation of symptoms with weather or environmental changes or other aggravating or alleviating factors except as outlined above    Review of Systems  Constitutional: Positive for chills. Negative for fever and unexpected weight change.  HENT: Positive for rhinorrhea. Negative for ear pain, nosebleeds, congestion, sore throat, sneezing, trouble swallowing, dental problem, voice change, postnasal drip and sinus pressure.   Eyes: Negative for visual disturbance.  Respiratory: Positive for cough and shortness of breath. Negative for choking.   Cardiovascular: Negative for chest pain and leg swelling.  Gastrointestinal: Negative for vomiting, abdominal pain and diarrhea.  Genitourinary: Negative for difficulty urinating.  Musculoskeletal: Positive for arthralgias.  Skin: Negative for rash.  Neurological: Negative for tremors, syncope and headaches.  Hematological: Does not bruise/bleed easily.       Objective:   Physical Exam Wt Readings from Last 3 Encounters:  01/29/12 92 lb 6.4 oz (41.912 kg)    01/22/12 93 lb (42.185 kg)  01/04/12 91 lb (41.277 kg)   HEENT mild turbinate edema.  Oropharynx no thrush or excess pnd or cobblestoning.  No JVD or cervical adenopathy. Mild accessory muscle hypertrophy. Trachea midline, nl thryroid. Chest was hyperinflated by percussion with diminished breath sounds and moderate increased exp time without wheeze. Hoover sign positive at mid inspiration. Regular rate and rhythm without murmur gallop or rub or increase P2 or edema.  Abd: no hsm, nl excursion. Ext warm without cyanosis or clubbing.    01/22/12 cxr Stable. No acute cardiopulmonary process.       Assessment & Plan:

## 2012-01-29 NOTE — Patient Instructions (Addendum)
Try protonix 40 mg  Take 30-60 min before first meal of the day and Pepcid 20 mg one bedtime until cough is completely gone for at least a week without the need for cough suppression like delsym   GERD (REFLUX)  is an extremely common cause of respiratory symptoms, many times with no significant heartburn at all.    It can be treated with medication, but also with lifestyle changes including avoidance of late meals, excessive alcohol, smoking cessation, and avoid fatty foods, chocolate, peppermint, colas, red wine, and acidic juices such as orange juice.  NO MINT OR MENTHOL PRODUCTS SO NO COUGH DROPS  USE SUGARLESS CANDY INSTEAD (jolley ranchers or Stover's)  NO OIL BASED VITAMINS - use powdered substitutes.    Please schedule a follow up office visit in 4 weeks, sooner if needed with pft's

## 2012-01-29 NOTE — Assessment & Plan Note (Signed)
When respiratory symptoms begin or become refractory well after a patient reports complete smoking cessation,  Especially when this wasn't the case while they were smoking, a red flag is raised based on the work of Dr Primitivo Gauze which states:  if you quit smoking when your best day FEV1 is still well preserved it is highly unlikely you will progress to severe disease.  That is to say, once the smoking stops,  the symptoms should not suddenly erupt or markedly worsen.  If so, the differential diagnosis should include  obesity/deconditioning,  LPR/Reflux/Aspiration syndromes,  occult CHF, or  especially side effect of medications commonly used in this population.    In her case she was fine until pna now with persistent symptoms despite cxr now clear but most of these are upper airway in nature strongly pointing to lpr/ gerd since so severe she can't lie in her own bed  For now rec she rx with ppi qam and hsh2 and f/u with pft's

## 2012-03-03 ENCOUNTER — Ambulatory Visit: Payer: Federal, State, Local not specified - PPO | Admitting: Internal Medicine

## 2012-04-08 ENCOUNTER — Ambulatory Visit (INDEPENDENT_AMBULATORY_CARE_PROVIDER_SITE_OTHER): Payer: Federal, State, Local not specified - PPO | Admitting: Internal Medicine

## 2012-04-08 ENCOUNTER — Encounter: Payer: Self-pay | Admitting: Internal Medicine

## 2012-04-08 ENCOUNTER — Ambulatory Visit: Payer: Federal, State, Local not specified - PPO

## 2012-04-08 ENCOUNTER — Telehealth: Payer: Self-pay | Admitting: Radiology

## 2012-04-08 VITALS — BP 112/68 | HR 74 | Temp 99.0°F | Resp 16 | Ht 64.0 in | Wt 97.6 lb

## 2012-04-08 DIAGNOSIS — H9209 Otalgia, unspecified ear: Secondary | ICD-10-CM

## 2012-04-08 DIAGNOSIS — L02419 Cutaneous abscess of limb, unspecified: Secondary | ICD-10-CM

## 2012-04-08 DIAGNOSIS — M25519 Pain in unspecified shoulder: Secondary | ICD-10-CM

## 2012-04-08 DIAGNOSIS — IMO0002 Reserved for concepts with insufficient information to code with codable children: Secondary | ICD-10-CM

## 2012-04-08 DIAGNOSIS — Z139 Encounter for screening, unspecified: Secondary | ICD-10-CM

## 2012-04-08 DIAGNOSIS — R109 Unspecified abdominal pain: Secondary | ICD-10-CM

## 2012-04-08 DIAGNOSIS — R1900 Intra-abdominal and pelvic swelling, mass and lump, unspecified site: Secondary | ICD-10-CM

## 2012-04-08 LAB — POCT CBC
Granulocyte percent: 64.9 % (ref 37–80)
HCT, POC: 41.1 % (ref 37.7–47.9)
Hemoglobin: 12.9 g/dL (ref 12.2–16.2)
Lymph, poc: 2.5 (ref 0.6–3.4)
MCH, POC: 59.4 pg — AB (ref 27–31.2)
MCHC: 31.4 g/dL — AB (ref 31.8–35.4)
MCV: 93.7 fL (ref 80–97)
MID (cbc): 0.6 (ref 0–0.9)
MPV: 8.8 fL (ref 0–99.8)
POC Granulocyte: 5.8 (ref 2–6.9)
POC LYMPH PERCENT: 28.3 % (ref 10–50)
POC MID %: 6.8 % (ref 0–12)
Platelet Count, POC: 384 10*3/uL (ref 142–424)
RBC: 4.39 M/uL (ref 4.04–5.48)
RDW, POC: 15.5 %
WBC: 8.9 10*3/uL (ref 4.6–10.2)

## 2012-04-08 LAB — POCT SEDIMENTATION RATE: POCT SED RATE: 48 mm/hr — AB (ref 0–22)

## 2012-04-08 LAB — BUN: BUN: 10 mg/dL (ref 6–23)

## 2012-04-08 LAB — CREATININE, SERUM: Creat: 0.73 mg/dL (ref 0.50–1.10)

## 2012-04-08 LAB — CALCIUM: Calcium: 9.6 mg/dL (ref 8.4–10.5)

## 2012-04-08 MED ORDER — HYDROCODONE-ACETAMINOPHEN 7.5-325 MG/15ML PO SOLN: 5.0000 mL | Freq: Four times a day (QID) | ORAL | Status: DC | PRN

## 2012-04-08 NOTE — Telephone Encounter (Signed)
Stat labs called to answering service at 10:10 pm. Spoke with Delaney Meigs at [facility] - normal with BUN 10, creatinine 0.73. Will route message to clinical pool as appears waiting on this for CT scheduling tomorrow.

## 2012-04-08 NOTE — Telephone Encounter (Signed)
Patient advised she can go tomorrow for the scan, after lab results.

## 2012-04-08 NOTE — Patient Instructions (Signed)

## 2012-04-08 NOTE — Progress Notes (Signed)
  Subjective:    Patient ID: Cynthia Bowen, female    DOB: [birth year], 66 y.o.   MRN: [id number]  HPI Has 3 painful masses, one on right pinna for a few months, one on mid epig abdom wall for 1 week, And a large tender mass on right sterno clavicular joint for 2 days. Not sick, no fever, no appetite loss or change, feels good otherwise. Had normal exam and labs with Dr. Merla Riches in September, normal mammograms   Review of Systems Copd/former smoker    Objective:   Physical Exam  Constitutional: She appears well-nourished. No distress.  HENT:  Right Ear: Hearing, tympanic membrane and ear canal normal. There is swelling.  Left Ear: Hearing, tympanic membrane, external ear and ear canal normal.  Ears:       Tender mass, not red  Eyes: EOM are normal.  Abdominal: Soft. Bowel sounds are normal. She exhibits mass.         Mobile cystic mass  Musculoskeletal:       Arms:      Pain and mass at S-C joint   Tender mass on right outer pinna, tender mobile cystic mass at mid epigastric midline Left clavicle mass very tender at joint with sternum Breast exam was normal       Assessment & Plan:  New multiple masses Will ct scan clavicular mass tomorrow Reeval on Thursday with me, am Lortab elixir prn painice packs

## 2012-04-08 NOTE — Telephone Encounter (Signed)
Left message for patient, Dr Perrin Maltese wants her to have her CT scan today. She can go for walkin for the scan. However he did not order BUN and Creat. Maralyn Sago has ordered this STAT and when it results,we can send for the CT scan.

## 2012-04-09 ENCOUNTER — Ambulatory Visit
Admission: RE | Admit: 2012-04-09 | Discharge: 2012-04-09 | Disposition: A | Discharge status: Home / Self Care | Payer: Federal, State, Local not specified - PPO | Source: Ambulatory Visit | Attending: Internal Medicine | Admitting: Internal Medicine

## 2012-04-09 DIAGNOSIS — M25519 Pain in unspecified shoulder: Secondary | ICD-10-CM

## 2012-04-09 MED ORDER — IOHEXOL 300 MG/ML  SOLN
75.0000 mL | Freq: Once | INTRAMUSCULAR | Status: AC | PRN
  Administered 2012-04-09: 75 mL via INTRAVENOUS

## 2012-04-09 NOTE — Telephone Encounter (Signed)
Called patient to advise. She is going today for the scan as a walk in scan.

## 2012-04-10 ENCOUNTER — Ambulatory Visit (INDEPENDENT_AMBULATORY_CARE_PROVIDER_SITE_OTHER): Payer: Federal, State, Local not specified - PPO | Admitting: Internal Medicine

## 2012-04-10 VITALS — BP 105/67 | HR 83 | Temp 98.1°F | Resp 16 | Ht 64.0 in | Wt 96.2 lb

## 2012-04-10 DIAGNOSIS — M25519 Pain in unspecified shoulder: Secondary | ICD-10-CM

## 2012-04-10 DIAGNOSIS — R1900 Intra-abdominal and pelvic swelling, mass and lump, unspecified site: Secondary | ICD-10-CM

## 2012-04-10 DIAGNOSIS — T7840XA Allergy, unspecified, initial encounter: Secondary | ICD-10-CM

## 2012-04-10 MED ORDER — RANITIDINE HCL 150 MG PO TABS: 150.0000 mg | ORAL_TABLET | Freq: Two times a day (BID) | ORAL | Status: DC

## 2012-04-10 NOTE — Progress Notes (Signed)
  Subjective:    Patient ID: Cynthia Bowen, female    DOB: 1946-02-21, 66 y.o.   MRN: [id number]  HPI 66 year old female presents for recheck of clavicle mass.  She was here on 12/17 and evaluated by Dr. Perrin Maltese and instructed to use ice regularly and hydrocodone as needed for pain.  States the mass on her clavicle has improved dramatically and she has much less pain now.  Swelling has almost completely resolved.  CT scan of clavicle negative.  Abdominal mass persists and is still tender to touch.  Also complains of right hand redness and swelling since this a.m. Admits that is it extremely pruritic. No lip/tongue swelling, SOB, or chest tightness.     Review of Systems  Constitutional: Negative for fever and chills.  Cardiovascular: Positive for chest pain (along clavicle - improved).  Gastrointestinal: Positive for abdominal pain (mass).  Skin: Negative for rash.       Objective:   Physical Exam  HENT:  Head: Normocephalic and atraumatic.  Eyes: Conjunctivae normal are normal.  Neck: Normal range of motion.  Cardiovascular: Normal rate.   Pulmonary/Chest: Effort normal.    Abdominal: Soft. Bowel sounds are normal. She exhibits mass (midline - tenderness to palpation). There is tenderness.  Neurological: She is alert.  Skin: Skin is warm and dry.  Psychiatric: She has a normal mood and affect. Her behavior is normal. Judgment and thought content normal.     Zyrtec 10 mg and Zantac 150 mg given today in the office.  Patient seen and examined by Dr. Perrin Maltese and myself. He agrees with assessment and plan.     Assessment & Plan:   1. Abdominal mass  Ambulatory referral to General Surgery  2. Allergic reaction    3. Joint pain in the shoulder/clavicle region    Refer to general surgery for evaluation of abdominal mass - should be in the next 2-5 days.   Continue zyrtec and zantac daily. Ice to right hand to help with swelling.  Benadryl 25-50 mg at bedtime Follow up if symptoms are  worsening or fail to improve

## 2012-04-17 ENCOUNTER — Encounter (HOSPITAL_COMMUNITY): Payer: Self-pay | Admitting: *Deleted

## 2012-04-17 ENCOUNTER — Emergency Department (HOSPITAL_COMMUNITY): Payer: Medicare Other

## 2012-04-17 ENCOUNTER — Telehealth: Payer: Self-pay | Admitting: Radiology

## 2012-04-17 ENCOUNTER — Inpatient Hospital Stay (HOSPITAL_COMMUNITY)
Admission: EM | Admit: 2012-04-17 | Discharge: 2012-04-19 | DRG: 392 | Disposition: A | Discharge status: Home / Self Care | Payer: Medicare Other | Attending: Internal Medicine | Admitting: Internal Medicine

## 2012-04-17 DIAGNOSIS — Z87891 Personal history of nicotine dependence: Secondary | ICD-10-CM

## 2012-04-17 DIAGNOSIS — K319 Disease of stomach and duodenum, unspecified: Principal | ICD-10-CM | POA: Diagnosis present

## 2012-04-17 DIAGNOSIS — D72829 Elevated white blood cell count, unspecified: Secondary | ICD-10-CM | POA: Diagnosis present

## 2012-04-17 DIAGNOSIS — I319 Disease of pericardium, unspecified: Secondary | ICD-10-CM | POA: Diagnosis present

## 2012-04-17 DIAGNOSIS — E876 Hypokalemia: Secondary | ICD-10-CM | POA: Diagnosis present

## 2012-04-17 DIAGNOSIS — J4489 Other specified chronic obstructive pulmonary disease: Secondary | ICD-10-CM | POA: Diagnosis present

## 2012-04-17 DIAGNOSIS — I3139 Other pericardial effusion (noninflammatory): Secondary | ICD-10-CM | POA: Diagnosis present

## 2012-04-17 DIAGNOSIS — Z79899 Other long term (current) drug therapy: Secondary | ICD-10-CM

## 2012-04-17 DIAGNOSIS — I313 Pericardial effusion (noninflammatory): Secondary | ICD-10-CM | POA: Diagnosis present

## 2012-04-17 DIAGNOSIS — M5090 Cervical disc disorder, unspecified, unspecified cervical region: Secondary | ICD-10-CM

## 2012-04-17 DIAGNOSIS — K2990 Gastroduodenitis, unspecified, without bleeding: Secondary | ICD-10-CM

## 2012-04-17 DIAGNOSIS — N200 Calculus of kidney: Secondary | ICD-10-CM | POA: Diagnosis present

## 2012-04-17 DIAGNOSIS — J449 Chronic obstructive pulmonary disease, unspecified: Secondary | ICD-10-CM | POA: Diagnosis present

## 2012-04-17 DIAGNOSIS — R933 Abnormal findings on diagnostic imaging of other parts of digestive tract: Secondary | ICD-10-CM

## 2012-04-17 DIAGNOSIS — J189 Pneumonia, unspecified organism: Secondary | ICD-10-CM

## 2012-04-17 DIAGNOSIS — R1900 Intra-abdominal and pelvic swelling, mass and lump, unspecified site: Secondary | ICD-10-CM

## 2012-04-17 DIAGNOSIS — M549 Dorsalgia, unspecified: Secondary | ICD-10-CM

## 2012-04-17 DIAGNOSIS — R109 Unspecified abdominal pain: Secondary | ICD-10-CM | POA: Diagnosis present

## 2012-04-17 DIAGNOSIS — K2970 Gastritis, unspecified, without bleeding: Secondary | ICD-10-CM | POA: Diagnosis present

## 2012-04-17 HISTORY — DX: Chronic obstructive pulmonary disease, unspecified: J44.9

## 2012-04-17 LAB — COMPREHENSIVE METABOLIC PANEL
ALT: 8 U/L (ref 0–35)
AST: 15 U/L (ref 0–37)
Alkaline Phosphatase: 93 U/L (ref 39–117)
CO2: 28 mEq/L (ref 19–32)
Calcium: 10.1 mg/dL (ref 8.4–10.5)
Chloride: 99 mEq/L (ref 96–112)
GFR calc non Af Amer: 88 mL/min — ABNORMAL LOW (ref >90)
Potassium: 3.2 mEq/L — ABNORMAL LOW (ref 3.5–5.1)
Sodium: 140 mEq/L (ref 135–145)

## 2012-04-17 LAB — CBC
MCH: 30.1 pg (ref 26.0–34.0)
Platelets: 457 10*3/uL — ABNORMAL HIGH (ref 150–400)
RBC: 4.32 MIL/uL (ref 3.87–5.11)
WBC: 14.3 10*3/uL — ABNORMAL HIGH (ref 4.0–10.5)

## 2012-04-17 MED ORDER — MORPHINE SULFATE 2 MG/ML IJ SOLN: 1.0000 mg | INTRAMUSCULAR | Status: DC | PRN

## 2012-04-17 MED ORDER — MORPHINE SULFATE 4 MG/ML IJ SOLN
6.0000 mg | Freq: Once | INTRAMUSCULAR | Status: AC
  Administered 2012-04-17: 6 mg via INTRAVENOUS
  Filled 2012-04-17: qty 2

## 2012-04-17 MED ORDER — SODIUM CHLORIDE 0.9 % IV SOLN
Freq: Once | INTRAVENOUS | Status: AC
  Administered 2012-04-17: 20 mL/h via INTRAVENOUS

## 2012-04-17 MED ORDER — ONDANSETRON HCL 4 MG/2ML IJ SOLN
4.0000 mg | Freq: Once | INTRAMUSCULAR | Status: AC
  Administered 2012-04-17: 4 mg via INTRAVENOUS
  Filled 2012-04-17: qty 2

## 2012-04-17 MED ORDER — FAMOTIDINE 20 MG PO TABS
20.0000 mg | ORAL_TABLET | Freq: Every day | ORAL | Status: DC
  Administered 2012-04-17 – 2012-04-19 (×2): 20 mg via ORAL
  Filled 2012-04-17 (×3): qty 1

## 2012-04-17 MED ORDER — ENOXAPARIN SODIUM 30 MG/0.3ML ~~LOC~~ SOLN
30.0000 mg | SUBCUTANEOUS | Status: DC
  Administered 2012-04-17 – 2012-04-18 (×2): 30 mg via SUBCUTANEOUS
  Filled 2012-04-17 (×3): qty 0.3

## 2012-04-17 MED ORDER — ONDANSETRON HCL 4 MG/2ML IJ SOLN: 4.0000 mg | Freq: Three times a day (TID) | INTRAMUSCULAR | Status: DC | PRN

## 2012-04-17 MED ORDER — HYDROCODONE-ACETAMINOPHEN 5-325 MG PO TABS
1.0000 | ORAL_TABLET | ORAL | Status: DC | PRN
  Filled 2012-04-17: qty 2

## 2012-04-17 MED ORDER — AZITHROMYCIN 500 MG PO TABS
500.0000 mg | ORAL_TABLET | ORAL | Status: DC
  Administered 2012-04-17: 500 mg via ORAL
  Filled 2012-04-17 (×2): qty 1

## 2012-04-17 MED ORDER — ONDANSETRON HCL 4 MG PO TABS
4.0000 mg | ORAL_TABLET | Freq: Four times a day (QID) | ORAL | Status: DC | PRN
  Administered 2012-04-19: 4 mg via ORAL
  Filled 2012-04-17: qty 1

## 2012-04-17 MED ORDER — MORPHINE SULFATE 2 MG/ML IJ SOLN
2.0000 mg | INTRAMUSCULAR | Status: DC | PRN
  Administered 2012-04-17 – 2012-04-18 (×2): 2 mg via INTRAVENOUS
  Administered 2012-04-18: 4 mg via INTRAVENOUS
  Administered 2012-04-18: 2 mg via INTRAVENOUS
  Administered 2012-04-18: 4 mg via INTRAVENOUS
  Filled 2012-04-17: qty 1
  Filled 2012-04-17: qty 2
  Filled 2012-04-17 (×2): qty 1
  Filled 2012-04-17: qty 2

## 2012-04-17 MED ORDER — IOHEXOL 300 MG/ML  SOLN
100.0000 mL | Freq: Once | INTRAMUSCULAR | Status: AC | PRN
  Administered 2012-04-17: 80 mL via INTRAVENOUS

## 2012-04-17 MED ORDER — MORPHINE SULFATE 4 MG/ML IJ SOLN
4.0000 mg | Freq: Once | INTRAMUSCULAR | Status: AC
  Administered 2012-04-17: 4 mg via INTRAVENOUS
  Filled 2012-04-17: qty 1

## 2012-04-17 MED ORDER — DEXTROSE 5 % IV SOLN
1.0000 g | INTRAVENOUS | Status: DC
  Administered 2012-04-17: 1 g via INTRAVENOUS
  Filled 2012-04-17 (×2): qty 10

## 2012-04-17 MED ORDER — ONDANSETRON HCL 4 MG/2ML IJ SOLN
4.0000 mg | Freq: Four times a day (QID) | INTRAMUSCULAR | Status: DC | PRN
  Administered 2012-04-17 – 2012-04-19 (×3): 4 mg via INTRAVENOUS
  Filled 2012-04-17 (×4): qty 2

## 2012-04-17 MED ORDER — SODIUM CHLORIDE 0.9 % IJ SOLN
3.0000 mL | Freq: Two times a day (BID) | INTRAMUSCULAR | Status: DC
  Administered 2012-04-19: 3 mL via INTRAVENOUS

## 2012-04-17 MED ORDER — SODIUM CHLORIDE 0.9 % IV SOLN
INTRAVENOUS | Status: AC
  Administered 2012-04-17 – 2012-04-18 (×2): via INTRAVENOUS

## 2012-04-17 MED ORDER — FLUOXETINE HCL 20 MG PO CAPS
20.0000 mg | ORAL_CAPSULE | Freq: Every day | ORAL | Status: DC
  Administered 2012-04-17 – 2012-04-19 (×2): 20 mg via ORAL
  Filled 2012-04-17 (×3): qty 1

## 2012-04-17 MED ORDER — TRIAZOLAM 0.25 MG PO TABS: 0.2500 mg | ORAL_TABLET | Freq: Every evening | ORAL | Status: DC | PRN

## 2012-04-17 NOTE — Telephone Encounter (Signed)
Patient has checked in at ED  ?

## 2012-04-17 NOTE — ED Notes (Signed)
Patient transported to CT 

## 2012-04-17 NOTE — ED Provider Notes (Signed)
Medical screening examination/treatment/procedure(s) were conducted as a shared visit with non-physician practitioner(s) and myself.  I personally evaluated the patient during the encounter Patient with worsening cough, shortness of breath and vomiting. On CT she was found to have a worsening pericardial effusion compared to prior without any tamponade findings. Also CT showed gastric thickening that requires endoscopy for further evaluation. Unknown if gastritis, lymphoma or other gastric cancer. This may also be the cause of her pericardial effusion. EKG is within normal limits and patient is hemodynamically stable. Will admit for further evaluation  Gwyneth Sprout, MD 04/17/12 2328

## 2012-04-17 NOTE — ED Notes (Signed)
Pt alert and oriented x4. Respirations even and unlabored, bilateral symmetrical rise and fall of chest. Skin warm and dry. In no acute distress. Denies needs.   

## 2012-04-17 NOTE — Telephone Encounter (Signed)
I spoke to patient, she has called in with abdominal pain and severe weakness. She has not gotten the appt yet with the abdominal surgeon. I have told her she needs to be seen either here or in the ER. She states she does not think she can come in here. I have advised her to call an ambulance to take her to the hospital. She was questioning if she was having trouble with the meds given, Zantac. I advised her this does not seem like a medication problem it seems like her abdominal pain is severe, and she should go to the hospital. I have offered to call the ambulance for her. She states her nephew is with her, he does not drive, but she can call the ambulance and he can stay with her until they get there.

## 2012-04-17 NOTE — ED Notes (Signed)
rn told pts 5 visitors they could all be in room for 5 more minutes and then only 2 visitors allowed in room.

## 2012-04-17 NOTE — ED Provider Notes (Signed)
MSE was initiated and I personally evaluated the patient and placed orders (if any) at  2:47 PM on April 17, 2012.  The patient appears stable so that the remainder of the MSE may be completed by another provider.  CBC, CMP, lipase ordered.  Pain and nausea treated.  The patient does have an abdominal mass is palpable in her upper abdomen.  She also reports some shortness of breath with exertion.  No active chest pain at this time.  Lyanne Co, MD 04/17/12 [id number]

## 2012-04-17 NOTE — H&P (Addendum)
[facility] History and Physical  Cynthia Bowen UJW:[id number] DOB: 1945-07-22 DOA: 04/17/2012  Referring physician: ED physician PCP: Elvina Sidle, MD   Chief Complaint: Abdominal pain  HPI:  Pt is 66 yo female with COPD (former smoker), who presented to [facility] ED with main concern of progressively worsening generalized abdominal pain that initially started several weeks prior to admission and associated with nausea, non bloody vomiting, poor oral intake, exertional shortness of breath. Pt describes pain as dull, occasionally but not consistently radiating to the lower abdominal quadrants, 7/10 in severity when present, worse when eating and with no specific alleviating factors. Pt reports seeing primary provider 3 weeks prior to this admission and was told there was a mass in her abdomen but this has now been worked up to date. Pt denies fevers, chills, chest pain, no other systemic symptoms, no urinary concerns.   ED EVENTS: CT abdomen and pelvis with pericardial effusion and ? Gastritis vs gastric carcinoma/lymphoma and metastatic disease. [facility] cardiology team consulted by ED physician. [facility] asked to admit for further evaluation.   Assessment and Plan: Principal Problem:  *Abdominal pain, nausea and vomiting - unclear what the exact etiology is at this time but CT abdomen and pelvis findings certainly worrisome for malignancy and further work up is indicated - Gi consult obtained, appreciate input - recent CT chest 12/17 with no acute pulmonary finding but emphysematous changes noted  - for now provide supportive care with analgesia, antiemetics as needed - keep NPO for now Active Problems:  Pericardial effusion - cardiology consulted, per last CT chest 12/17 this appeared to be small and unclear if really contributing to pt's shortness of breath - appreciate cardiology input - keep oxygen saturations > 90%  COPD (chronic obstructive pulmonary disease) - pt's shortness of  breath can be also related to COPD but no wheezing noted on exam - CXR indicative of chronic bronchitis and there is mild leukocytosis on exam - will obtain sputum culture and gram stain, empiric antibiotics for now  Leukocytosis - unclear if related to acute bronchitis in the setting of COPD/Emphysema  - will treat with empiric ABX as noted above and obtain CBC in AM  Hypokalemia - likely secondary to vomiting - will supplement - BMP in AM Renal stone, right side - non obstructing as noted per imaging studies  Radiological Exams on Admission: Dg Chest 2 View 04/17/2012  Minimal bronchitic changes. Nonobstructing right renal calculus.     Ct Abdomen Pelvis W Contrast 04/17/2012  Pericardial effusion  Marked thickening of the antrum of the stomach.   This could be due to gastritis.  Gastric carcinoma, lymphoma, and metastatic disease are other possibilities.   Endoscopy is suggested.     Code Status: Full Family Communication: Pt at bedside Disposition Plan:    Review of Systems:  Constitutional: Negative for fever, chills and malaise/fatigue. Negative for diaphoresis.  HENT: Negative for hearing loss, ear pain, nosebleeds, congestion, sore throat, neck pain, tinnitus and ear discharge.   Eyes: Negative for blurred vision, double vision, photophobia, pain, discharge and redness.  Respiratory: Negative for hemoptysis, positive for shortness of breath and cough.  Cardiovascular: Negative for chest pain, palpitations, orthopnea, claudication and leg swelling.  Gastrointestinal: Positive for nausea, vomiting and abdominal pain. Negative for heartburn, constipation, blood in stool and melena.  Genitourinary: Negative for dysuria, urgency, frequency, hematuria and flank pain.  Musculoskeletal: Negative for myalgias, back pain, joint pain and falls.  Skin: Negative for itching and rash.  Neurological: Negative  for dizziness and weakness. Negative for tingling, tremors, sensory change,  speech change, focal weakness, loss of consciousness and headaches.  Endo/Heme/Allergies: Negative for environmental allergies and polydipsia. Does not bruise/bleed easily.  Psychiatric/Behavioral: Negative for suicidal ideas. The patient is not nervous/anxious.      Past Medical History  Diagnosis Date  . COPD (chronic obstructive pulmonary disease)     Past Surgical History  Procedure Date  . Abdominal hysterectomy   . Cholecystectomy     pt states didn't have on 04/17/12    Social History:  reports that she quit smoking about 7 years ago. Her smoking use included Cigarettes. She started smoking about 9 years ago. She has a 17.5 pack-year smoking history. She has never used smokeless tobacco. She reports that she does not drink alcohol or use illicit drugs.  Allergies  Allergen Reactions  . Avelox (Moxifloxacin Hcl In Nacl) Nausea And Vomiting  . Alendronate Sodium Nausea And Vomiting  . Aspirin Nausea Only  . Codeine Nausea And Vomiting  . Doxycycline   . Fluconazole   . Neurontin (Gabapentin) Other (See Comments)    Mood changes   . Sertraline Hcl Other (See Comments)    Hallucinations   . Sulfa Antibiotics Rash    History reviewed. No pertinent family history.  Prior to Admission medications   Medication Sig Start Date End Date Taking? Authorizing Provider  Ascorbic Acid (VITAMIN C PO) Take 1 tablet by mouth daily.   Yes Historical Provider, MD  FLUoxetine (PROZAC) 20 MG capsule Take 20 mg by mouth daily.   Yes Historical Provider, MD  hydrocodone-acetaminophen (HYCET) 7.5-325 MG/15ML solution Take 5 mLs by mouth every 6 (six) hours as needed for pain (or cough). 04/08/12  Yes Jonita Albee, MD  Multiple Vitamin (MULTIVITAMIN) tablet Take 1 tablet by mouth daily.   Yes Historical Provider, MD  ranitidine (ZANTAC) 150 MG tablet Take 1 tablet (150 mg total) by mouth 2 (two) times daily. 04/10/12  Yes Heather M Marte, PA-C  triazolam (HALCION) 0.25 MG tablet Take 0.25 mg  by mouth at bedtime as needed. For sleep.   Yes Historical Provider, MD    Physical Exam: Filed Vitals:   04/17/12 1730 04/17/12 1802 04/17/12 1837 04/17/12 1850  BP:  115/62 147/85 143/70  Pulse: 72 74    Temp:  98.6 F (37 C)    TempSrc:  Oral    Resp: 16 16    SpO2: 95% 96%      Physical Exam  Constitutional: Appears well-developed and well-nourished. No distress.  HENT: Normocephalic. External right and left ear normal. Oropharynx is clear and moist.  Eyes: Conjunctivae and EOM are normal. PERRLA, no scleral icterus.  Neck: Normal ROM. Neck supple. No JVD. No tracheal deviation. No thyromegaly.  CVS: RRR, S1/S2 +, no murmurs, no gallops, no carotid bruit.  Pulmonary: Effort and breath sounds normal, no stridor, rhonchi, wheezes, rales.  Abdominal: Soft. BS +,  no distension, tenderness in epigastric area, no rebound or guarding. abdominal mass palpable Musculoskeletal: Normal range of motion. No edema and no tenderness.  Lymphadenopathy: No lymphadenopathy noted, cervical, inguinal. Neuro: Alert. Normal reflexes, muscle tone coordination. No cranial nerve deficit. Skin: Skin is warm and dry. No rash noted. Not diaphoretic. No erythema. No pallor.  Psychiatric: Normal mood and affect. Behavior, judgment, thought content normal.   Labs on Admission:  Basic Metabolic Panel:  Lab 04/17/12 4098  NA 140  K 3.2*  CL 99  CO2 28  GLUCOSE 94  BUN 9  CREATININE 0.72  CALCIUM 10.1  MG --  PHOS --   Liver Function Tests:  Lab 04/17/12 1446  AST 15  ALT 8  ALKPHOS 93  BILITOT 0.4  PROT 8.3  ALBUMIN 3.7    Lab 04/17/12 1446  LIPASE 41  AMYLASE --   CBC:  Lab 04/17/12 1446  WBC 14.3*  NEUTROABS --  HGB 13.0  HCT 37.5  MCV 86.8  PLT 457*   EKG: Normal sinus rhythm, no ST/T wave changes  Debbora Presto, MD  [facility] Pager [phone]  If 7PM-7AM, please contact night-coverage [email or web address] Password [email or web address] 04/17/2012, 7:10 PM

## 2012-04-17 NOTE — ED Notes (Signed)
Pt back from CT

## 2012-04-17 NOTE — ED Notes (Signed)
Pt to xray

## 2012-04-17 NOTE — Consult Note (Addendum)
[facility]       CONSULTATION NOTE   Reason for Consult: Pericardial effusion  Requesting Physician: Emergency department  Cardiologist: None to date  HPI: This is a 66 y.o. female with a past medical history significant for COPD, gastroesophageal reflux disease and depression who presents with several weeks of nausea, vomiting, anorexia and a roughly 10-15 pound weight loss over the last 6 months. She has recently developed progressively worsening abdominal pain. About 3 weeks ago she detected an epigastric mass and this is very tender to touch. She denies any complaints of chest discomfort either at rest or with exertion. Has not been troubled by palpitations dizziness syncope focal neurological deficits or lower extremity edema. She does have dyspnea on exertion which has been attributed to COPD.  She quit smoking in 2007. She does have a roughly 20 pack year history of smoking. She does not drink alcohol. She has never had coronary disease, congestive heart failure, valvular heart disease or clinically detected arrhythmia.  A CT of the abdomen performed today inadvertently slicing through the lower part of her chest shows a pericardial effusion. This was described as moderate in size and larger than it had been on a previous radiological study performed roughly a week ago. At that time a CT scan of the chest described a small pericardial effusion. Incidentally noted were atherosclerotic calcifications of the coronary arteries and the ostium of the right renal artery as well as right kidney stones. Neither the chest nor the abdominal CT showed evidence of lymphadenopathy or liver metastases. The abdominal CT does show thickening of the gastric antrum mucosa, and raises concern for gastric carcinoma versus lymphoma versus metastatic disease to the stomach.  PMHx:  Past Medical History  Diagnosis Date  . COPD (chronic obstructive pulmonary disease)    Past  Surgical History  Procedure Date  . Abdominal hysterectomy   . Cholecystectomy     pt states didn't have on 04/17/12    FAMHx: History reviewed. No pertinent family history.  SOCHx:  reports that she quit smoking about 7 years ago. Her smoking use included Cigarettes. She started smoking about 9 years ago. She has a 17.5 pack-year smoking history. She has never used smokeless tobacco. She reports that she does not drink alcohol or use illicit drugs.  ALLERGIES: Allergies  Allergen Reactions  . Avelox (Moxifloxacin Hcl In Nacl) Nausea And Vomiting  . Alendronate Sodium Nausea And Vomiting  . Aspirin Nausea Only  . Codeine Nausea And Vomiting  . Doxycycline   . Fluconazole   . Neurontin (Gabapentin) Other (See Comments)    Mood changes   . Sertraline Hcl Other (See Comments)    Hallucinations   . Sulfa Antibiotics Rash    ROS: Constitutional: positive for anorexia, fatigue and weight loss Eyes: negative Ears, nose, mouth, throat, and face: negative Respiratory: positive for cough and dyspnea on exertion, negative for hemoptysis and sputum Cardiovascular: negative for chest pain, irregular heart beat and orthopnea Gastrointestinal: positive for abdominal pain, dyspepsia, nausea, reflux symptoms and vomiting, negative for change in bowel habits and jaundice Genitourinary:negative Integument/breast: negative Hematologic/lymphatic: negative for bleeding and easy bruising Musculoskeletal:negative Neurological: negative Behavioral/Psych: positive for depression Endocrine: negative for diabetic symptoms including polydipsia, polyuria and poor wound healing Allergic/Immunologic: negative  HOME MEDICATIONS:  (Not in a hospital admission)  HOSPITAL MEDICATIONS: Scheduled:   . azithromycin  500 mg Oral Q24H  . enoxaparin (LOVENOX) injection  30 mg Subcutaneous Q24H  .  famotidine  20 mg Oral Daily  . FLUoxetine  20 mg Oral Daily  . sodium chloride  3 mL Intravenous Q12H     VITALS: Blood pressure 143/70, pulse 74, temperature 98.6 F (37 C), temperature source Oral, resp. rate 16, SpO2 96.00%.  PHYSICAL EXAM: General appearance: alert, cooperative, mild distress and Underweight Neck: no adenopathy, no carotid bruit, no JVD, supple, symmetrical, trachea midline, thyroid not enlarged, symmetric, no tenderness/mass/nodules and No evidence of Kussmaul's sign Lungs: clear to auscultation bilaterally Heart: regular rate and rhythm, S1, S2 normal, no murmur, click, rub or gallop Abdomen: normal findings: aorta normal, no bruits heard, no organomegaly and spleen non-palpable and abnormal findings:  mass, located in the epigastrium and The mass is mobile to the abdominal wall, firm, tender and roughly spherical. It is at least 6 cm in diameter Extremities: extremities normal, atraumatic, no cyanosis or edema Pulses: 2+ and symmetric Skin: Skin color, texture, turgor normal. No rashes or lesions Neurologic: Alert and oriented X 3, normal strength and tone. Normal symmetric reflexes. Normal coordination and gait  LABS: Results for orders placed during the hospital encounter of 04/17/12 (from the past 48 hour(s))  CBC     Status: Abnormal   Collection Time   04/17/12  2:46 PM      Component Value Range Comment   WBC 14.3 (*) 4.0 - 10.5 K/uL    RBC 4.32  3.87 - 5.11 MIL/uL    Hemoglobin 13.0  12.0 - 15.0 g/dL    HCT 16.1  09.6 - 04.5 %    MCV 86.8  78.0 - 100.0 fL    MCH 30.1  26.0 - 34.0 pg    MCHC 34.7  30.0 - 36.0 g/dL    RDW 40.9  81.1 - 91.4 %    Platelets 457 (*) 150 - 400 K/uL   COMPREHENSIVE METABOLIC PANEL     Status: Abnormal   Collection Time   04/17/12  2:46 PM      Component Value Range Comment   Sodium 140  135 - 145 mEq/L    Potassium 3.2 (*) 3.5 - 5.1 mEq/L    Chloride 99  96 - 112 mEq/L    CO2 28  19 - 32 mEq/L    Glucose, Bld 94  70 - 99 mg/dL    BUN 9  6 - 23 mg/dL    Creatinine, Ser 7.82  0.50 - 1.10 mg/dL    Calcium 95.6  8.4 -  10.5 mg/dL    Total Protein 8.3  6.0 - 8.3 g/dL    Albumin 3.7  3.5 - 5.2 g/dL    AST 15  0 - 37 U/L    ALT 8  0 - 35 U/L    Alkaline Phosphatase 93  39 - 117 U/L    Total Bilirubin 0.4  0.3 - 1.2 mg/dL    GFR calc non Af Amer 88 (*) >90 mL/min    GFR calc Af Amer >90  >90 mL/min   LIPASE, BLOOD     Status: Normal   Collection Time   04/17/12  2:46 PM      Component Value Range Comment   Lipase 41  11 - 59 U/L     IMAGING: Dg Chest 2 View  04/17/2012  *RADIOLOGY REPORT*  Clinical Data:  Cough, congestion, fever  CHEST - 2 VIEW  Comparison: 12/29/2011 Correlation:  CT abdomen 04/09/2012  Findings: Upper-normal size of cardiac silhouette. Mediastinal contours and pulmonary vascularity normal. Minimal  bronchitic changes. No pulmonary infiltrate, pleural effusion or pneumothorax. Prior cervical spine fusion. Calcification right upper quadrant corresponding to a renal calculus identified on a prior CT.  IMPRESSION: Minimal bronchitic changes. Nonobstructing right renal calculus.   Original Report Authenticated By: Ulyses Southward, M.D.    Ct Abdomen Pelvis W Contrast  04/17/2012  *RADIOLOGY REPORT*  Clinical Data: Abdominal pain.  Epigastric mass.  CT ABDOMEN AND PELVIS WITH CONTRAST  Technique:  Multidetector CT imaging of the abdomen and pelvis was performed following the standard protocol during bolus administration of intravenous contrast.  Contrast: 80mL OMNIPAQUE IOHEXOL 300 MG/ML  SOLN  Comparison: None.  Findings: Lung bases are clear.  There is a moderately large pericardial effusion along the base of the heart.  Liver gallbladder and bile ducts are normal.  Pancreas and spleen are normal.  Multiple nonobstructing calculi on the right measuring up to 6 mm. 3 cm right upper pole cyst.  Small lower pole cysts bilaterally. No renal obstruction.  There is marked thickening of the antrum of the stomach.  The stomach is not obstructed.  The duodenum and small bowel are not dilated or thickened.   Colon is negative.  Appendix not visualized. 22 mm right lateral pelvic cyst may be related to the right ovary or could be an small lymphocele.  Uterus is surgically absent. Extensive atherosclerotic disease in the aorta and iliac arteries without aneurysm.  Disc degeneration and spondylosis L5 S1.  No acute bony abnormality.  Negative for abdominal adenopathy.  IMPRESSION: Pericardial effusion  Marked thickening of the antrum of the stomach.  This could be due to gastritis.  Gastric carcinoma, lymphoma, and metastatic disease are other possibilities.  Endoscopy is suggested.   Original Report Authenticated By: Janeece Riggers, M.D.     IMPRESSION: 1. On my personal review of the chest CT the pericardial effusion appears small. Non-data chest CT can be inaccurate in estimating the size of a pericardial effusion. In addition a very limited imaging of the pericardium is performed on this abdominal scan. She should have a echocardiogram performed. From a clinical standpoint absolutely no signs or symptoms of cardiac tamponade. Actually, I doubt there is enough fluid for a pericardiocentesis. The absence of chest lymphadenopathy, abdominal adenopathy or liver metastases make it difficult to explain the connection between her abdominal mass/weight loss and a pericardial effusion. Conceivably a gastric lymphoma may be more prone to involve the pericardium than an adenocarcinoma  RECOMMENDATION: 1. Will review  once an echocardiogram is performed 2. A meaningful diagnosis is more likely to be achieved with upper endoscopy  Time Spent Directly with Patient: 30 minutes  Thurmon Fair, MD, [facility] [phone] office [phone] pager   Cynai Skeens 04/17/2012, 7:41 PM

## 2012-04-17 NOTE — ED Provider Notes (Signed)
History     CSN: [id number]  Arrival date & time 04/17/12  1306   First MD Initiated Contact with Patient 04/17/12 1407      Chief Complaint  Patient presents with  . Emesis  . Cough    (Consider location/radiation/quality/duration/timing/severity/associated sxs/prior treatment) Patient is a 66 y.o. female presenting with vomiting, cough, and abdominal pain. The history is provided by the patient. No language interpreter was used.  Emesis  Associated symptoms include abdominal pain and cough.  Cough Associated symptoms include shortness of breath. Pertinent negatives include no chest pain.  Abdominal Pain The primary symptoms of the illness include abdominal pain, shortness of breath and nausea. The primary symptoms of the illness do not include vomiting.   66 year old female coming in with complaint of shortness of breath with exertion and epigastric mass with abdominal pain and nausea.   Patient has a past medical history of COPD, depression, GERD, former smoker. Patient found a mass 3 weeks ago in her epigastric area. She goes to urgent medical care and sees Dr. Wonda Amis  who has not worked her mass up in any way. States that her shortness of breath is with exertion only.  Past Medical History  Diagnosis Date  . COPD (chronic obstructive pulmonary disease)     Past Surgical History  Procedure Date  . Abdominal hysterectomy   . Cholecystectomy     History reviewed. No pertinent family history.  History  Substance Use Topics  . Smoking status: Former Smoker -- 0.5 packs/day for 35 years    Types: Cigarettes    Start date: 01/04/2003  . Smokeless tobacco: Not on file  . Alcohol Use: No    OB History    Grav Para Term Preterm Abortions TAB SAB Ect Mult Living                  Review of Systems  Constitutional: Negative.   HENT: Negative.   Eyes: Negative.   Respiratory: Positive for cough and shortness of breath.   Cardiovascular: Negative.  Negative  for chest pain, palpitations and leg swelling.  Gastrointestinal: Positive for nausea and abdominal pain. Negative for vomiting.  Skin: Negative.   Neurological: Negative.  Negative for dizziness and weakness.  Psychiatric/Behavioral: Negative.   All other systems reviewed and are negative.    Allergies  Avelox; Alendronate sodium; Aspirin; Codeine; Doxycycline; Fluconazole; Neurontin; Sertraline hcl; and Sulfa antibiotics  Home Medications   Current Outpatient Rx  Name  Route  Sig  Dispense  Refill  . VITAMIN C PO   Oral   Take 1 tablet by mouth daily.         Marland Kitchen FLUOXETINE HCL 20 MG PO CAPS   Oral   Take 20 mg by mouth daily.         Marland Kitchen HYDROCODONE-ACETAMINOPHEN 7.5-325 MG/15ML PO SOLN   Oral   Take 5 mLs by mouth every 6 (six) hours as needed for pain (or cough).   240 mL   0   . ONE-DAILY MULTI VITAMINS PO TABS   Oral   Take 1 tablet by mouth daily.         Marland Kitchen RANITIDINE HCL 150 MG PO TABS   Oral   Take 1 tablet (150 mg total) by mouth 2 (two) times daily.   60 tablet   0   . TRIAZOLAM 0.25 MG PO TABS   Oral   Take 0.25 mg by mouth at bedtime as needed. For sleep.  BP 115/62  Pulse 74  Temp 98.6 F (37 C) (Oral)  Resp 16  SpO2 96%  Physical Exam  Nursing note and vitals reviewed. Constitutional: She is oriented to person, place, and time. She appears well-developed and well-nourished.       emaciated  HENT:  Head: Normocephalic and atraumatic.  Eyes: Conjunctivae normal and EOM are normal. Pupils are equal, round, and reactive to light.  Neck: Normal range of motion. Neck supple.  Cardiovascular: Normal rate and regular rhythm.   Pulmonary/Chest: Effort normal and breath sounds normal. No respiratory distress.  Abdominal: Soft. Bowel sounds are normal. She exhibits no distension. There is tenderness. There is no rebound and no guarding.       Epigastric mass  Musculoskeletal: Normal range of motion. She exhibits no edema and no  tenderness.  Neurological: She is alert and oriented to person, place, and time. She has normal reflexes.  Skin: Skin is warm and dry.  Psychiatric: She has a normal mood and affect.    ED Course  Procedures (including critical care time) 645 consult with Dr. Landry Mellow who will see patient in the ER for her pericardial effusion. 7:05 PM patient will be admitted by hospitalist team 8 Dr. Lenise Arena for care cardio effusion in gastric mass. Patient and family agree with plan. Temporary orders in.   Labs Reviewed  CBC - Abnormal; Notable for the following:    WBC 14.3 (*)     Platelets 457 (*)     All other components within normal limits  COMPREHENSIVE METABOLIC PANEL - Abnormal; Notable for the following:    Potassium 3.2 (*)     GFR calc non Af Amer 88 (*)     All other components within normal limits  LIPASE, BLOOD   Dg Chest 2 View  04/17/2012  *RADIOLOGY REPORT*  Clinical Data:  Cough, congestion, fever  CHEST - 2 VIEW  Comparison: 12/29/2011 Correlation:  CT abdomen 04/09/2012  Findings: Upper-normal size of cardiac silhouette. Mediastinal contours and pulmonary vascularity normal. Minimal bronchitic changes. No pulmonary infiltrate, pleural effusion or pneumothorax. Prior cervical spine fusion. Calcification right upper quadrant corresponding to a renal calculus identified on a prior CT.  IMPRESSION: Minimal bronchitic changes. Nonobstructing right renal calculus.   Original Report Authenticated By: Ulyses Southward, M.D.    Ct Abdomen Pelvis W Contrast  04/17/2012  *RADIOLOGY REPORT*  Clinical Data: Abdominal pain.  Epigastric mass.  CT ABDOMEN AND PELVIS WITH CONTRAST  Technique:  Multidetector CT imaging of the abdomen and pelvis was performed following the standard protocol during bolus administration of intravenous contrast.  Contrast: 80mL OMNIPAQUE IOHEXOL 300 MG/ML  SOLN  Comparison: None.  Findings: Lung bases are clear.  There is a moderately large pericardial effusion along the base  of the heart.  Liver gallbladder and bile ducts are normal.  Pancreas and spleen are normal.  Multiple nonobstructing calculi on the right measuring up to 6 mm. 3 cm right upper pole cyst.  Small lower pole cysts bilaterally. No renal obstruction.  There is marked thickening of the antrum of the stomach.  The stomach is not obstructed.  The duodenum and small bowel are not dilated or thickened.  Colon is negative.  Appendix not visualized. 22 mm right lateral pelvic cyst may be related to the right ovary or could be an small lymphocele.  Uterus is surgically absent. Extensive atherosclerotic disease in the aorta and iliac arteries without aneurysm.  Disc degeneration and spondylosis L5 S1.  No acute bony  abnormality.  Negative for abdominal adenopathy.  IMPRESSION: Pericardial effusion  Marked thickening of the antrum of the stomach.  This could be due to gastritis.  Gastric carcinoma, lymphoma, and metastatic disease are other possibilities.  Endoscopy is suggested.   Original Report Authenticated By: Janeece Riggers, M.D.      No diagnosis found.    MDM  66 year old female complaining of nausea, shortness of breath, an abdominal mass. She will be admitted by the hospitalist team and [facility] cardiology will consult for her pericardial effusion.. CT of the abdomen shows large pericardial effusion and thickening of the antrum of the stomach.   Labs unremarkable.  Pain meds and nausea meds given in the ER with results.  Chest x-ray shows no pneumonia reviewed by myself.    Labs Reviewed  CBC - Abnormal; Notable for the following:    WBC 14.3 (*)     Platelets 457 (*)     All other components within normal limits  COMPREHENSIVE METABOLIC PANEL - Abnormal; Notable for the following:    Potassium 3.2 (*)     GFR calc non Af Amer 88 (*)     All other components within normal limits  LIPASE, BLOOD          Remi Haggard, NP 04/17/12 1905

## 2012-04-17 NOTE — ED Notes (Addendum)
Pt reports hx of pneumonia 3 months ago. Reports she has had an intermittent cough since then, clear sputum. Pt was seen by pcp 12/19 for "abdominal mass" and "clavicle mass", had CT performed and referred to surgery. 12/18 pt has CT, CT showed right renal stones.  Pt reports upper abdomin is "cramping" 10/10 pain. Pt reports SOB starting last night, increases with exertion. At present O2 99% on room air. Pt vomited x2 this am. Reports nausea at present.

## 2012-04-17 NOTE — ED Notes (Addendum)
MD at bedside. 

## 2012-04-18 ENCOUNTER — Encounter (HOSPITAL_COMMUNITY)
Admission: EM | Disposition: A | Discharge status: Home / Self Care | Payer: Self-pay | Source: Home / Self Care | Attending: Internal Medicine

## 2012-04-18 ENCOUNTER — Encounter (HOSPITAL_COMMUNITY): Payer: Self-pay | Admitting: *Deleted

## 2012-04-18 DIAGNOSIS — K2990 Gastroduodenitis, unspecified, without bleeding: Secondary | ICD-10-CM

## 2012-04-18 DIAGNOSIS — R933 Abnormal findings on diagnostic imaging of other parts of digestive tract: Secondary | ICD-10-CM

## 2012-04-18 DIAGNOSIS — R1900 Intra-abdominal and pelvic swelling, mass and lump, unspecified site: Secondary | ICD-10-CM

## 2012-04-18 DIAGNOSIS — K2970 Gastritis, unspecified, without bleeding: Secondary | ICD-10-CM | POA: Diagnosis present

## 2012-04-18 HISTORY — PX: ESOPHAGOGASTRODUODENOSCOPY: SHX5428

## 2012-04-18 LAB — BASIC METABOLIC PANEL
CO2: 28 mEq/L (ref 19–32)
Calcium: 9.7 mg/dL (ref 8.4–10.5)
Creatinine, Ser: 0.73 mg/dL (ref 0.50–1.10)
GFR calc non Af Amer: 88 mL/min — ABNORMAL LOW (ref >90)
Sodium: 140 mEq/L (ref 135–145)

## 2012-04-18 LAB — CBC
HCT: 35.5 % — ABNORMAL LOW (ref 36.0–46.0)
Hemoglobin: 12.2 g/dL (ref 12.0–15.0)
MCH: 30 pg (ref 26.0–34.0)
MCHC: 34.4 g/dL (ref 30.0–36.0)
MCV: 87.4 fL (ref 78.0–100.0)
Platelets: 435 K/uL — ABNORMAL HIGH (ref 150–400)
RBC: 4.06 MIL/uL (ref 3.87–5.11)
RDW: 14.1 % (ref 11.5–15.5)
WBC: 11.7 K/uL — ABNORMAL HIGH (ref 4.0–10.5)

## 2012-04-18 LAB — LEGIONELLA ANTIGEN, URINE: Legionella Antigen, Urine: NEGATIVE

## 2012-04-18 LAB — HIV ANTIBODY (ROUTINE TESTING W REFLEX): HIV: NONREACTIVE

## 2012-04-18 SURGERY — EGD (ESOPHAGOGASTRODUODENOSCOPY)
Anesthesia: Moderate Sedation

## 2012-04-18 MED ORDER — SODIUM CHLORIDE 0.9 % IV SOLN
INTRAVENOUS | Status: DC
  Administered 2012-04-18: 13:00:00 via INTRAVENOUS

## 2012-04-18 MED ORDER — MIDAZOLAM HCL 10 MG/2ML IJ SOLN
INTRAMUSCULAR | Status: DC | PRN
  Administered 2012-04-18 (×2): 2 mg via INTRAVENOUS
  Administered 2012-04-18: 1 mg via INTRAVENOUS

## 2012-04-18 MED ORDER — GLYCOPYRROLATE 0.2 MG/ML IJ SOLN
INTRAMUSCULAR | Status: AC
  Filled 2012-04-18: qty 1

## 2012-04-18 MED ORDER — BUTAMBEN-TETRACAINE-BENZOCAINE 2-2-14 % EX AERO
INHALATION_SPRAY | CUTANEOUS | Status: DC | PRN
  Administered 2012-04-18: 2 via TOPICAL

## 2012-04-18 MED ORDER — MORPHINE SULFATE 4 MG/ML IJ SOLN
4.0000 mg | INTRAMUSCULAR | Status: DC | PRN
  Administered 2012-04-18 – 2012-04-19 (×3): 4 mg via INTRAVENOUS
  Filled 2012-04-18 (×3): qty 1

## 2012-04-18 MED ORDER — FENTANYL CITRATE 0.05 MG/ML IJ SOLN
INTRAMUSCULAR | Status: DC | PRN
  Administered 2012-04-18 (×2): 25 ug via INTRAVENOUS

## 2012-04-18 MED ORDER — FENTANYL CITRATE 0.05 MG/ML IJ SOLN
INTRAMUSCULAR | Status: AC
  Filled 2012-04-18: qty 2

## 2012-04-18 MED ORDER — MIDAZOLAM HCL 10 MG/2ML IJ SOLN
INTRAMUSCULAR | Status: AC
  Filled 2012-04-18: qty 2

## 2012-04-18 MED ORDER — GLYCOPYRROLATE 0.2 MG/ML IJ SOLN
INTRAMUSCULAR | Status: DC | PRN
  Administered 2012-04-18: 0.2 mg via INTRAVENOUS

## 2012-04-18 NOTE — Progress Notes (Signed)
[facility] PROGRESS NOTE  Cynthia Bowen WUJ:[id number] DOB: March 06, 1946 DOA: 04/17/2012  PCP: Elvina Sidle, MD  Brief HPI: Pt is 66 yo female with COPD (former smoker), who presented to [facility] ED with main concern of progressively worsening generalized abdominal pain that initially started several weeks prior to admission, associated with nausea, non bloody vomiting, poor oral intake, exertional shortness of breath and ~10-15 lb weight loss over 6 months. Pt describes pain as dull, occasionally but not consistently radiating to the lower abdominal quadrants, 7/10 in severity when present, worse when eating and with no specific alleviating factors. She was seen by primary provider 3 weeks prior to this admission and was detected to have epigastric mass that has been worked up with CT scan suspicious for ? gastric carcinoma, lymphoma. She was referred to general surgery as outpatient.  Past medical history:  Past Medical History  Diagnosis Date  . COPD (chronic obstructive pulmonary disease)      Consultants: Cardiology, Dr. Royann Shivers   Procedures: Upper GI endoscopy  Antibiotics: None  Subjective:  Patient complaining of abdominal pain which is not well controlled with current medications and requesting an increase. Was able to tolerate diet well.  Objective: Vital Signs  Filed Vitals:   04/17/12 1945 04/17/12 2015 04/17/12 2138 04/18/12 0545  BP: 108/66 130/71 132/62 110/67  Pulse: 72 70 67 74  Temp:  99 F (37.2 C) 98.6 F (37 C) 98.3 F (36.8 C)  TempSrc:  Oral Oral Oral  Resp: 20 18 18 18   Height:  5\' 4"  (1.626 m)    Weight:  91 lb 7.9 oz (41.5 kg)    SpO2: 96% 96% 97% 96%    Intake/Output Summary (Last 24 hours) at 04/18/12 9562 Last data filed at 04/18/12 0100  Gross per 24 hour  Intake      0 ml  Output      0 ml  Net      0 ml   Filed Weights   04/17/12 2015  Weight: 91 lb 7.9 oz (41.5 kg)    Intake/Output from previous day:    General  appearance: alert, cooperative and no distress Head: Normocephalic, without obvious abnormality, atraumatic Resp: clear to auscultation bilaterally Cardio: regular rate and rhythm, S1, S2 normal, no murmur, click, rub or gallop GI: soft, non-tender; bowel sounds normal; no masses,  no organomegaly Extremities: extremities normal, atraumatic, no cyanosis or edema Pulses: 2+ and symmetric Lymph nodes: Cervical, supraclavicular, and axillary nodes normal. Neurologic: Alert and oriented X 3, normal strength and tone. Normal symmetric reflexes. Normal coordination and gait  Lab Results:  Basic Metabolic Panel:  Lab 04/18/12 1308 04/17/12 1446  NA 140 140  K 3.8 3.2*  CL 102 99  CO2 28 28  GLUCOSE 78 94  BUN 9 9  CREATININE 0.73 0.72  CALCIUM 9.7 10.1  MG -- --  PHOS -- --   Liver Function Tests:  Lab 04/17/12 1446  AST 15  ALT 8  ALKPHOS 93  BILITOT 0.4  PROT 8.3  ALBUMIN 3.7    Lab 04/17/12 1446  LIPASE 41  AMYLASE --   CBC:  Lab 04/18/12 0500 04/17/12 1446  WBC 11.7* 14.3*  NEUTROABS -- --  HGB 12.2 13.0  HCT 35.5* 37.5  MCV 87.4 86.8  PLT 435* 457*      Studies/Results: Dg Chest 2 View  04/17/2012  IMPRESSION: Minimal bronchitic changes. Nonobstructing right renal calculus.   Original Report Authenticated By: Ulyses Southward, M.D.  Ct Abdomen Pelvis W Contrast  04/17/2012  IMPRESSION: Pericardial effusion  Marked thickening of the antrum of the stomach.  This could be due to gastritis.  Gastric carcinoma, lymphoma, and metastatic disease are other possibilities.  Endoscopy is suggested.   Original Report Authenticated By: Janeece Riggers, M.D.     Medications: I have reviewed the patient's current medications.  Assessment/Plan:  Principal Problem:  Abdominal mass:  Unclear what the exact etiology is at this time but CT abdomen and pelvis findings certainly worrisome for malignancy and further work up is indicated. Gi has been consulted for endoscopy with  biopsy. Appreciate their help. For now provide supportive care with analgesia, antiemetics as needed. GI plans to do EUS as outpatient. Endoscopy reveals very mild gastritis which does not warrant antibiotics at this time.   Pericardial effusion :cardiology consulted, per last CT chest 12/17 this appeared to be small and unclear if really contributing to pt's shortness of breath.  Appreciate cardiology inputs.  -2D echo ordered -will follow note after 2 d echo review   COPD (chronic obstructive pulmonary disease) : Stable. Does not appear to be COPD excac at this time. I will d/c antibiotics at this time as this does not seem COPD exac. Follow up sputum cultures  Leukocytosis: Stress demargination vs dehydration. improving     Hypokalemia likely secondary to vomiting. Resolved. Follow BMP in AM  Renal stone, right side:  non obstructing as noted per imaging studies   Code Status: Full  DVT Prophylaxis: Lovenox  Family Communication: discussed with patient Disposition Plan: pending until further work up for abdominal mass.    Time spent: 35 minutes  LOS: 1 day   Lars Mage  [facility] Pager [phone] 04/18/2012, 8:28 AM  If 8PM-8AM, please contact night-coverage at [email or web address], password [facility]

## 2012-04-18 NOTE — Progress Notes (Signed)
Endoscopy demonstrates very mild gastritis. There is the sense of poor distensibility of the antrum with infiltration of the submucosa. Biopsies were taken although I expect these biopsies did be non-diagnostic.  I would consider further evaluation with EUS. I will talk with Dr. Christella Hartigan. This can be done as an outpatient.

## 2012-04-18 NOTE — Op Note (Signed)
[facility] [address] [place], [address]   ENDOSCOPY PROCEDURE REPORT  PATIENT: Cynthia, Bowen  MR#: [id number] BIRTHDATE: 08-31-1945 , 66  yrs. old GENDER: Female ENDOSCOPIST: [name], MD REFERRED BY: PROCEDURE DATE:  04/18/2012 PROCEDURE:  EGD w/ biopsy ASA CLASS:     Class II INDICATIONS:  periumbilical abdominal pain. MEDICATIONS: These medications were titrated to patient response per physician's verbal order, Versed 5 mg IV, Fentanyl 50 mcg IV, and Robinul 0.2 mg IV TOPICAL ANESTHETIC: Cetacaine Spray  DESCRIPTION OF PROCEDURE: After the risks benefits and alternatives of the procedure were thoroughly explained, informed consent was obtained.  The    endoscope was introduced through the mouth and advanced to the second portion of the duodenum. Without limitations.  The instrument was slowly withdrawn as the mucosa was fully examined.      In the gastric antrum there appeared to be circumferential thickening of the submucosa with mild mucosal edema.  Biopsies were taken.   In the gastric antrum there appeared to be circumferential thickening of the submucosa with mild mucosal edema.  Biopsies were taken.   The remainder of the upper endoscopy exam was otherwise normal.  Retroflexed views revealed no abnormalities.     The scope was then withdrawn from the patient and the procedure completed.  COMPLICATIONS: There were no complications. ENDOSCOPIC IMPRESSION: 1.  possible submucosal infiltration in the area of the gastric antrum  RECOMMENDATIONS: 1.  await biopsy results; if negative would consider EUS REPEAT EXAM:  eSigned:  [name], MD 04/18/2012 2:11 PM   CC:

## 2012-04-18 NOTE — Progress Notes (Signed)
INITIAL NUTRITION ASSESSMENT  DOCUMENTATION CODES Per approved criteria  -Severe malnutrition in the context of chronic illness   INTERVENTION: Will follow for diet advancement. Add supplement with diet advancement ([employer] or [employer]). Pt does not like Ensure or Boost.  Encouraged PO intake with diet advancement.   NUTRITION DIAGNOSIS: Inadequate oral food intake related to decreased appetite as evidenced by significant wt loss (14% x 3 months).   Goal: 1) Pt will maintain current wt of 91# 2) Pt will meet >75% of estimated energy needs 3) Pt will be advanced to PO diet as medically indicated  Monitor:  Diet advancement/PO intake, labs, wt changes, skin integrity, changes in status  Reason for Assessment: Low BMI (15.7)  66 y.o. female  Admitting Dx: Abdominal pain  ASSESSMENT: Pt currently NPO for EGD for abdominal pain and to r/o metastatic disease. Abnormal CT suggested gastritis vs gastric carcinoma vs lymphoma.  Flat affect. Pt reports continued wt loss over the past 6 months to 1 yr. She reports poor appetite. Denies dysphagia. She reports that she likes sweets and eats a lot of junk foods, such as chocolate chip cookies. She does not drink nutritional supplements at home. She reports that she has tried Boost and Ensure previously and does not like the taste. She reports that she refuses to eat or drink anything that she does not like.  Pt reports a 15# (14%) wt loss x 6 months, which is significant. Pt meets criteria for severe MALNUTRITION in the context of chronic illness as evidenced by decreased appetite (<75% of estimated energy needs x 1 month) and 14% wt loss x 6 months.   Height: Ht Readings from Last 1 Encounters:  04/17/12 5\' 4"  (1.626 m)    Weight: Wt Readings from Last 1 Encounters:  04/17/12 91 lb 7.9 oz (41.5 kg)    Ideal Body Weight: 120#  % Ideal Body Weight: 76%  Wt Readings from Last 10 Encounters:  04/17/12 91 lb 7.9 oz (41.5 kg)    04/17/12 91 lb 7.9 oz (41.5 kg)  04/10/12 96 lb 3.2 oz (43.636 kg)  04/08/12 97 lb 9.6 oz (44.271 kg)  01/29/12 92 lb 6.4 oz (41.912 kg)  01/22/12 93 lb (42.185 kg)  01/04/12 91 lb (41.277 kg)  01/01/12 95 lb (43.092 kg)  12/29/11 92 lb (41.731 kg)  11/23/11 93 lb (42.185 kg)    Usual Body Weight: 120#  % Usual Body Weight: 76%  BMI:  Body mass index is 15.70 kg/(m^2). Classified as underweight.   Estimated Nutritional Needs: Kcal: 1241-1447 kcals daily Protein: 41-52 grams protein daily Fluid: 1.2-1.4 L fluid daily  Skin: WDL  Diet Order: NPO  EDUCATION NEEDS: -Education needs addressed   Intake/Output Summary (Last 24 hours) at 04/18/12 1300 Last data filed at 04/18/12 1242  Gross per 24 hour  Intake      0 ml  Output    200 ml  Net   -200 ml    Last BM: 04/17/12  Labs:   Lab 04/18/12 0500 04/17/12 1446  NA 140 140  K 3.8 3.2*  CL 102 99  CO2 28 28  BUN 9 9  CREATININE 0.73 0.72  CALCIUM 9.7 10.1  MG -- --  PHOS -- --  GLUCOSE 78 94    CBG (last 3)  No results found for this basename: GLUCAP:3 in the last 72 hours  Scheduled Meds:   . [COMPLETED] sodium chloride   Intravenous STAT  . azithromycin  500 mg Oral  Q24H  . cefTRIAXone (ROCEPHIN)  IV  1 g Intravenous Q24H  . enoxaparin (LOVENOX) injection  30 mg Subcutaneous Q24H  . famotidine  20 mg Oral Daily  . FLUoxetine  20 mg Oral Daily  . sodium chloride  3 mL Intravenous Q12H    Continuous Infusions:   Past Medical History  Diagnosis Date  . COPD (chronic obstructive pulmonary disease)     Past Surgical History  Procedure Date  . Abdominal hysterectomy   . Cholecystectomy     pt states didn't have on 04/17/12    Melody Haver, RD, LDN Pager: [phone] After hours Pager: [phone]

## 2012-04-18 NOTE — Consult Note (Signed)
Referring Provider: No ref. provider found Primary Care Physician:  Elvina Sidle, MD Primary Gastroenterologist:  [facility] (patient requesting not to see them)  Reason for Consultation:  Abnormal CT scan of the abdomen; abdominal pai  HPI: Cynthia Bowen is a 66 y.o. female with COPD (former smoker), who presented to [facility] ED with main concern of progressively worsening epigastric abdominal pain that initially started several weeks prior to admission.  It is a constant pain, but sometimes worse than others.  Associated with some nausea for the last couple of days, but no vomiting; she does have poor oral intake and says that she has lost 30 pounds over the last 3-4 months.  No black or bloody stool.  CT abdomen and pelvis with pericardial effusion and ? Gastritis vs gastric carcinoma, lymphoma, or metastatic disease. [facility] cardiology team consulted by ED physician and will follow-up on the pericardial effusion.  Patient says that she had EGD and colonoscopy a few years ago by [facility], and we are currently trying to obtain those records.  Patient requested not to be seen by [facility] on this admission.    She says that she takes Protonix once daily for the last few years for acid reflux.  The medication helps those symptoms.   Past Medical History  Diagnosis Date  . COPD (chronic obstructive pulmonary disease)     Past Surgical History  Procedure Date  . Abdominal hysterectomy   . Cholecystectomy     pt states didn't have on 04/17/12    Prior to Admission medications   Medication Sig Start Date End Date Taking? Authorizing Provider  Ascorbic Acid (VITAMIN C PO) Take 1 tablet by mouth daily.   Yes Historical Provider, MD  FLUoxetine (PROZAC) 20 MG capsule Take 20 mg by mouth daily.   Yes Historical Provider, MD  hydrocodone-acetaminophen (HYCET) 7.5-325 MG/15ML solution Take 5 mLs by mouth every 6 (six) hours as needed for pain (or cough). 04/08/12  Yes Jonita Albee, MD  Multiple  Vitamin (MULTIVITAMIN) tablet Take 1 tablet by mouth daily.   Yes Historical Provider, MD  ranitidine (ZANTAC) 150 MG tablet Take 1 tablet (150 mg total) by mouth 2 (two) times daily. 04/10/12  Yes Heather M Marte, PA-C  triazolam (HALCION) 0.25 MG tablet Take 0.25 mg by mouth at bedtime as needed. For sleep.   Yes Historical Provider, MD    Current Facility-Administered Medications  Medication Dose Route Frequency Provider Last Rate Last Dose  . [COMPLETED] 0.9 %  sodium chloride infusion   Intravenous STAT Remi Haggard, NP 75 mL/hr at 04/18/12 0539    . azithromycin (ZITHROMAX) tablet 500 mg  500 mg Oral Q24H Dorothea Ogle, MD   500 mg at 04/17/12 2247  . cefTRIAXone (ROCEPHIN) 1 g in dextrose 5 % 50 mL IVPB  1 g Intravenous Q24H Dorothea Ogle, MD   1 g at 04/17/12 2247  . enoxaparin (LOVENOX) injection 30 mg  30 mg Subcutaneous Q24H Dorothea Ogle, MD   30 mg at 04/17/12 2247  . famotidine (PEPCID) tablet 20 mg  20 mg Oral Daily Dorothea Ogle, MD   20 mg at 04/17/12 2247  . FLUoxetine (PROZAC) capsule 20 mg  20 mg Oral Daily Dorothea Ogle, MD   20 mg at 04/17/12 2247  . HYDROcodone-acetaminophen (NORCO/VICODIN) 5-325 MG per tablet 1-2 tablet  1-2 tablet Oral Q4H PRN Dorothea Ogle, MD      . morphine 2 MG/ML injection 2-4 mg  2-4 mg Intravenous Q3H PRN Dorothea Ogle, MD   2 mg at 04/18/12 0548  . ondansetron (ZOFRAN) tablet 4 mg  4 mg Oral Q6H PRN Dorothea Ogle, MD       Or  . ondansetron [facility]) injection 4 mg  4 mg Intravenous Q6H PRN Dorothea Ogle, MD   4 mg at 04/17/12 2339  . sodium chloride 0.9 % injection 3 mL  3 mL Intravenous Q12H Dorothea Ogle, MD      . triazolam (HALCION) tablet 0.25 mg  0.25 mg Oral QHS PRN Dorothea Ogle, MD        Allergies as of 04/17/2012 - Review Complete 04/17/2012  Allergen Reaction Noted  . Avelox (moxifloxacin hcl in nacl) Nausea And Vomiting 01/04/2012  . Alendronate sodium Nausea And Vomiting 06/12/2011  . Aspirin Nausea Only 06/12/2011  . Codeine  Nausea And Vomiting 06/12/2011  . Doxycycline  06/12/2011  . Fluconazole  06/12/2011  . Neurontin (gabapentin) Other (See Comments) 06/12/2011  . Sertraline hcl Other (See Comments) 06/12/2011  . Sulfa antibiotics Rash 06/12/2011    History reviewed. No pertinent family history.  History   Social History  . Marital Status: Married    Spouse Name: N/A    Number of Children: 0  . Years of Education: N/A   Occupational History  .     Social History Main Topics  . Smoking status: Former Smoker -- 0.5 packs/day for 35 years    Types: Cigarettes    Start date: 01/04/2003    Quit date: 04/17/2005  . Smokeless tobacco: Never Used  . Alcohol Use: No  . Drug Use: No  . Sexually Active: Not on file   Other Topics Concern  . Not on file   Social History Narrative  . No narrative on file    Review of Systems: Ten point ROS is O/W negative except as mentioned in HPI.    Physical Exam: Vital signs in last 24 hours: Temp:  [98.3 F (36.8 C)-100.1 F (37.8 C)] 98.3 F (36.8 C) (12/27 0545) Pulse Rate:  [67-78] 74  (12/27 0545) Resp:  [16-20] 18  (12/27 0545) BP: (108-154)/(62-87) 110/67 mmHg (12/27 0545) SpO2:  [95 %-100 %] 96 % (12/27 0545) Weight:  [91 lb 7.9 oz (41.5 kg)] 91 lb 7.9 oz (41.5 kg) (12/26 2015) Last BM Date: 04/17/12 General:   Alert, very thin, pleasant and cooperative in NAD. Head:  Normocephalic and atraumatic. Eyes:  Sclera clear, no icterus.  Conjunctiva pink. Ears:  Normal auditory acuity. Mouth:  No deformity or lesions.   Lungs:  Clear throughout to auscultation.  No wheezes, crackles, or rhonchi.  Heart:  Regular rate and rhythm; no murmurs, clicks, rubs,  or gallops. Abdomen:  Soft, non-distended, BS active, epigastric and LUQ TTP. Palpable, tender movable nodule just superior to umbilicus  Rectal:  Deferred  Msk:  Symmetrical without gross deformities. Pulses:  Normal pulses noted. Extremities:  Without clubbing or edema. Neurologic:  Alert  and  oriented x4;  grossly normal neurologically. Skin:  Intact without significant lesions or rashes. Psych:  Alert and cooperative. Normal mood and affect.  Lab Results:  Basename 04/18/12 0500 04/17/12 1446  WBC 11.7* 14.3*  HGB 12.2 13.0  HCT 35.5* 37.5  PLT 435* 457*   BMET  Basename 04/18/12 0500 04/17/12 1446  NA 140 140  K 3.8 3.2*  CL 102 99  CO2 28 28  GLUCOSE 78 94  BUN 9 9  CREATININE 0.73  0.72  CALCIUM 9.7 10.1   LFT  Basename 04/17/12 1446  PROT 8.3  ALBUMIN 3.7  AST 15  ALT 8  ALKPHOS 93  BILITOT 0.4  BILIDIR --  IBILI --    Studies/Results: Dg Chest 2 View  04/17/2012  *RADIOLOGY REPORT*  Clinical Data:  Cough, congestion, fever  CHEST - 2 VIEW  Comparison: 12/29/2011 Correlation:  CT abdomen 04/09/2012  Findings: Upper-normal size of cardiac silhouette. Mediastinal contours and pulmonary vascularity normal. Minimal bronchitic changes. No pulmonary infiltrate, pleural effusion or pneumothorax. Prior cervical spine fusion. Calcification right upper quadrant corresponding to a renal calculus identified on a prior CT.  IMPRESSION: Minimal bronchitic changes. Nonobstructing right renal calculus.   Original Report Authenticated By: Ulyses Southward, M.D.    Ct Abdomen Pelvis W Contrast  04/17/2012  *RADIOLOGY REPORT*  Clinical Data: Abdominal pain.  Epigastric mass.  CT ABDOMEN AND PELVIS WITH CONTRAST  Technique:  Multidetector CT imaging of the abdomen and pelvis was performed following the standard protocol during bolus administration of intravenous contrast.  Contrast: 80mL OMNIPAQUE IOHEXOL 300 MG/ML  SOLN  Comparison: None.  Findings: Lung bases are clear.  There is a moderately large pericardial effusion along the base of the heart.  Liver gallbladder and bile ducts are normal.  Pancreas and spleen are normal.  Multiple nonobstructing calculi on the right measuring up to 6 mm. 3 cm right upper pole cyst.  Small lower pole cysts bilaterally. No renal  obstruction.  There is marked thickening of the antrum of the stomach.  The stomach is not obstructed.  The duodenum and small bowel are not dilated or thickened.  Colon is negative.  Appendix not visualized. 22 mm right lateral pelvic cyst may be related to the right ovary or could be an small lymphocele.  Uterus is surgically absent. Extensive atherosclerotic disease in the aorta and iliac arteries without aneurysm.  Disc degeneration and spondylosis L5 S1.  No acute bony abnormality.  Negative for abdominal adenopathy.  IMPRESSION: Pericardial effusion  Marked thickening of the antrum of the stomach.  This could be due to gastritis.  Gastric carcinoma, lymphoma, and metastatic disease are other possibilities.  Endoscopy is suggested.   Original Report Authenticated By: Janeece Riggers, M.D.     IMPRESSION:  -Abnormal CT scan showing gastritis vs gastric carcinoma, lymphoma, or metastatic disease. -Abdominal pain:  Likely secondary to above.  PLAN: -EGD today. -I did request records from [facility] regarding previous endoscopies.   ZEHR, JESSICA D.  04/18/2012, 9:20 AM  Pager number [phone]  Chart was reviewed and patient was examined. X-rays were reviewed.    I agree with management and plans. Suspect gastric neoplasm as cause for weight loss, abdominal pain.  She has a tender nodule which could be a malignant node or metastatic disease.  Etiology for pericardial effusion is unclear.  Metastatic disease is a possiblity.  Barbette Hair. Arlyce Dice, M.D., [facility] Cell [phone]

## 2012-04-19 LAB — BASIC METABOLIC PANEL
Calcium: 9.6 mg/dL (ref 8.4–10.5)
Creatinine, Ser: 0.7 mg/dL (ref 0.50–1.10)
GFR calc non Af Amer: 89 mL/min — ABNORMAL LOW (ref >90)
Glucose, Bld: 88 mg/dL (ref 70–99)
Sodium: 139 mEq/L (ref 135–145)

## 2012-04-19 MED ORDER — ONDANSETRON 4 MG PO TBDP: 4.0000 mg | ORAL_TABLET | Freq: Four times a day (QID) | ORAL | Status: DC | PRN

## 2012-04-19 MED ORDER — MORPHINE SULFATE 15 MG PO TABS: 15.0000 mg | ORAL_TABLET | ORAL | Status: DC | PRN

## 2012-04-19 NOTE — Progress Notes (Signed)
*  PRELIMINARY RESULTS* Echocardiogram 2D Echocardiogram has been performed.  Cynthia Bowen 04/19/2012, 9:00 AM

## 2012-04-19 NOTE — Discharge Summary (Signed)
Physician Discharge Summary  Cynthia Bowen ZOX:[id number] DOB: 1945/08/26 DOA: 04/17/2012  PCP: Elvina Sidle, MD  Admit date: 04/17/2012 Discharge date: 04/19/2012  Recommendations for Outpatient Follow-up:  Follow-up Information    Schedule an appointment as soon as possible for a visit with [name], MD. (Dr. Nita Sells office will contact you early next week regarding Endoscopic Ultrasound)    Contact information:   [address] [place] [address] [phone]       Follow up with Thurmon Fair, MD. Call in 1 week. [facility] cardiology will call you next week with appointment details. Please call their office if you do not hear back from them.)    Contact information:   [address] [place] [address] [phone]         Discharge Diagnoses:  # Abdominal pain , N/V, weight loss- suspected malignancy       (a) CT  Shows thickened stomach antrum suspicious for gastric Ca or lymphoma or gastritis       ( a) s/p endoscopy with biopsy with results pending but expected to have low diagnostic yield       ( b) EUS planned as outpatient   # Small pericardial effusion # Leucocytosis # COPD # Depression # OA   Discharge Condition: Stable Disposition: Home  Diet recommendation: Regular  Filed Weights   04/17/12 2015  Weight: 91 lb 7.9 oz (41.5 kg)    History of present illness: Pt is 66 yo female with COPD (former smoker), who presented to [facility] ED with main concern of progressively worsening generalized abdominal pain that initially started several weeks prior to admission, associated with nausea, non bloody vomiting, poor oral intake, exertional shortness of breath and ~10-15 lb weight loss over 6 months. Pt describes pain as dull, occasionally but not consistently radiating to the lower abdominal quadrants, 7/10 in severity when present, worse when eating and with no specific alleviating factors. She was seen by  primary provider 3 weeks prior to this admission and was detected to have epigastric mass that has been worked up with CT scan suspicious for ? gastric carcinoma, lymphoma.    Hospital Course:  1. Abdominal pain, N/V: Patient presented with progressively worsening abdominal pain associated with nausea, vomiting, decreased appetite for last 3 weeks. She reported weight loss ~ 15 lbs over 3 -4 months. She was suspected to have epigastric mass when she was seen at her PCP office about 3 weeks and work up was initiated . CT was obtained that showed thickening of gastric antrum suspicious for gastric carcinoma, lymphoma or metastatic disease. GI consult was obtained during the hospitalization and patient underwent endoscopy that showed gastritis and biopsies were obtained. It showed poor distensibility of the antrum with infiltration of submucosa and there is a probability that biopsies could be non diagnostic as per Dr. Arlyce Dice and he recommended to get EUS as an outpatient. Patient would be discharged home on antiemetics to help with her nausea.   2. Pericardial effusion: Appears to be small effusion.  2D echo was obtained. Cardiology consult was obtained and effusion was thought to be very small for pericardiocentesis. No evidence of cardiac tamponade. I talked to Dr. Royann Shivers who felt it safe to discharge patient home with instructions to follow up as outpatient.  3. Leucocytosis: likely 2/2 dehydration vs stress de margination. Stable at d/c.  4. Hypokalemia: 2/2 nausea and vomiting. Resolved at d/c.   5. Renal stone, right side: non  obstructing as noted per imaging studies        Discharge Instructions      Discharge Orders    Future Orders Please Complete By Expires   Diet - low sodium heart healthy      Increase activity slowly          Medication List     As of 04/19/2012 11:30 AM    TAKE these medications         FLUoxetine 20 MG capsule   Commonly known as: PROZAC   Take 20  mg by mouth daily.      hydrocodone-acetaminophen 7.5-325 MG/15ML solution   Commonly known as: HYCET   Take 5 mLs by mouth every 6 (six) hours as needed for pain (or cough).      morphine 15 MG tablet   Commonly known as: MSIR   Take 1 tablet (15 mg total) by mouth every 4 (four) hours as needed for pain.      multivitamin tablet   Take 1 tablet by mouth daily.      ondansetron 4 MG disintegrating tablet   Commonly known as: ZOFRAN-ODT   Take 1 tablet (4 mg total) by mouth every 6 (six) hours as needed for nausea.      ranitidine 150 MG tablet   Commonly known as: ZANTAC   Take 1 tablet (150 mg total) by mouth 2 (two) times daily.      triazolam 0.25 MG tablet   Commonly known as: HALCION   Take 0.25 mg by mouth at bedtime as needed. For sleep.      VITAMIN C PO   Take 1 tablet by mouth daily.         Follow-up Information    Schedule an appointment as soon as possible for a visit with [name], MD. (Dr. Nita Sells office will contact you early next week regarding Endoscopic Ultrasound)    Contact information:   [address] [place] [address] [phone]       Follow up with Thurmon Fair, MD. Call in 1 week. [facility] cardiology will call you next week with appointment details. Please call their office if you do not hear back from them.)    Contact information:   [address] [place] [address] [phone]           The results of significant diagnostics from this hospitalization (including imaging, microbiology, ancillary and laboratory) are listed below for reference.    Significant Diagnostic Studies: Dg Chest 2 View  04/17/2012  *RADIOLOGY REPORT*  Clinical Data:  Cough, congestion, fever  CHEST - 2 VIEW  Comparison: 12/29/2011 Correlation:  CT abdomen 04/09/2012  Findings: Upper-normal size of cardiac silhouette. Mediastinal contours and pulmonary vascularity normal. Minimal bronchitic changes.  No pulmonary infiltrate, pleural effusion or pneumothorax. Prior cervical spine fusion. Calcification right upper quadrant corresponding to a renal calculus identified on a prior CT.  IMPRESSION: Minimal bronchitic changes. Nonobstructing right renal calculus.   Original Report Authenticated By: Ulyses Southward, M.D.    Dg Chest 2 View  04/08/2012  *RADIOLOGY REPORT*  Clinical Data: Chest pain.  Smoker.  CHEST - 2 VIEW  Comparison: 01/22/2012  Findings: The lungs are mildly hyperexpanded.  The lungs are clear. Lucency at the apices suggests emphysema.  The cardiac silhouette is normal in size.  No mediastinal or hilar mass or adenopathy.  The bony thorax is demineralized but intact.  IMPRESSION: No acute cardiopulmonary disease.  COPD.   Original Report Authenticated By: Amie Portland, M.D.    Dg Clavicle Left  04/08/2012  *RADIOLOGY REPORT*  Clinical Data: Shoulder pain.  Tender mass over the left clavicle near the sternum.  LEFT CLAVICLE - 2+ VIEWS  Comparison: Chest radiograph 01/22/2012  Findings: Subtle lucency is suggested in the medial left clavicle, not well defined.  There is no fracture.  There is no bony expansion.  The sternoclavicular and AC joints are normally aligned.  The underlying ribs intact and the visualized lungs are clear.  IMPRESSION: Lucency is suggested in the medial left clavicle without a defined lesion.  This may simply be due to bony demineralization.  This is felt most likely.  However, if symptoms persist, a follow-up CT of the left sternoclavicular joint would be recommended.   Original Report Authenticated By: Amie Portland, M.D.    Ct Chest W Contrast  04/09/2012  *RADIOLOGY REPORT*  Clinical Data: Left medial clavicle pain.  Ex-smoker.  Cough.  CT CHEST WITH CONTRAST  Technique:  Multidetector CT imaging of the chest was performed following the standard protocol during bolus administration of intravenous contrast.  Contrast: 75mL OMNIPAQUE IOHEXOL 300 MG/ML  SOLN  Comparison:  None.  Findings: No pathologically enlarged mediastinal, hilar or axillary lymph nodes.  Atherosclerotic calcification of the arterial vasculature including coronary arteries.  Small pericardial effusion.  Heart size normal.  Centrilobular emphysema.  Lungs are clear.  No pleural fluid. Airway is unremarkable.  Incidental imaging of the upper abdomen shows stones in the right kidney measuring up to 7 mm.  Low attenuation lesions in the visualized portions of the kidneys measure up to 3.2 cm on the right, likely cysts.  Atherosclerotic calcification of the arterial vasculature including prominent right renal artery ostial calcification.  No worrisome lytic or sclerotic lesions.  Sternoclavicular joints appear symmetric.  No soft tissue mass.  IMPRESSION:  1.  Small pericardial effusion. 2.  Right renal stones.   Original Report Authenticated By: Leanna Battles, M.D.    Ct Abdomen Pelvis W Contrast  04/17/2012  *RADIOLOGY REPORT*  Clinical Data: Abdominal pain.  Epigastric mass.  CT ABDOMEN AND PELVIS WITH CONTRAST  Technique:  Multidetector CT imaging of the abdomen and pelvis was performed following the standard protocol during bolus administration of intravenous contrast.  Contrast: 80mL OMNIPAQUE IOHEXOL 300 MG/ML  SOLN  Comparison: None.  Findings: Lung bases are clear.  There is a moderately large pericardial effusion along the base of the heart.  Liver gallbladder and bile ducts are normal.  Pancreas and spleen are normal.  Multiple nonobstructing calculi on the right measuring up to 6 mm. 3 cm right upper pole cyst.  Small lower pole cysts bilaterally. No renal obstruction.  There is marked thickening of the antrum of the stomach.  The stomach is not obstructed.  The duodenum and small bowel are not dilated or thickened.  Colon is negative.  Appendix not visualized. 22 mm right lateral pelvic cyst may be related to the right ovary or could be an small lymphocele.  Uterus is surgically absent. Extensive  atherosclerotic disease in the aorta and iliac arteries without aneurysm.  Disc degeneration and spondylosis L5 S1.  No acute bony abnormality.  Negative for abdominal adenopathy.  IMPRESSION: Pericardial effusion  Marked thickening of the antrum of the stomach.  This could be due to gastritis.  Gastric carcinoma, lymphoma, and metastatic disease are other possibilities.  Endoscopy is suggested.   Original Report Authenticated By: Janeece Riggers, M.D.  Microbiology: No results found for this or any previous visit (from the past 240 hour(s)).   Labs: Basic Metabolic Panel:  Lab 04/19/12 4098 04/18/12 0500 04/17/12 1446  NA 139 140 140  K 3.4* 3.8 3.2*  CL 102 102 99  CO2 29 28 28   GLUCOSE 88 78 94  BUN 12 9 9   CREATININE 0.70 0.73 0.72  CALCIUM 9.6 9.7 10.1  MG -- -- --  PHOS -- -- --   Liver Function Tests:  Lab 04/17/12 1446  AST 15  ALT 8  ALKPHOS 93  BILITOT 0.4  PROT 8.3  ALBUMIN 3.7    Lab 04/17/12 1446  LIPASE 41  AMYLASE --   CBC:  Lab 04/18/12 0500 04/17/12 1446  WBC 11.7* 14.3*  NEUTROABS -- --  HGB 12.2 13.0  HCT 35.5* 37.5  MCV 87.4 86.8  PLT 435* 457*    Active Problems:  COPD (chronic obstructive pulmonary disease)  Pericardial effusion  Nonspecific (abnormal) findings on radiological and other examination of gastrointestinal tract  Unspecified gastritis and gastroduodenitis without mention of hemorrhage   Time coordinating discharge: 40 minutes  Signed:  Lars Mage, MD [facility] 04/19/2012, 11:30 AM

## 2012-04-21 ENCOUNTER — Encounter (HOSPITAL_COMMUNITY): Payer: Self-pay | Admitting: Gastroenterology

## 2012-04-22 ENCOUNTER — Ambulatory Visit (INDEPENDENT_AMBULATORY_CARE_PROVIDER_SITE_OTHER): Payer: Medicare Other | Admitting: Family Medicine

## 2012-04-22 VITALS — BP 128/69 | HR 83 | Temp 98.3°F | Resp 17 | Ht 65.5 in | Wt 97.0 lb

## 2012-04-22 DIAGNOSIS — R1900 Intra-abdominal and pelvic swelling, mass and lump, unspecified site: Secondary | ICD-10-CM

## 2012-04-22 DIAGNOSIS — T7840XA Allergy, unspecified, initial encounter: Secondary | ICD-10-CM

## 2012-04-22 MED ORDER — EPINEPHRINE 0.3 MG/0.3ML IJ DEVI: 0.3000 mg | Freq: Once | INTRAMUSCULAR | Status: DC

## 2012-04-22 NOTE — Patient Instructions (Addendum)
Please take your zantac and a zyrtec or claritin every day.   Use benadryl as needed until the swelling subsides- you can take up to 50 mg (2 of the regular pills) every 6 hours as needed.    Try to avoid your pain medication for the rest of the day if you are able

## 2012-04-22 NOTE — Progress Notes (Signed)
[facility] [address], [place] [address] [phone]  Date:  04/22/2012   Name:  Cynthia Bowen   DOB:  1945-10-03   MRN:  [id number]  PCP:  Elvina Sidle, MD    Chief Complaint: Facial Swelling, Hand Injury and Allergic Reaction   History of Present Illness:  Cynthia Bowen is a 66 y.o. very pleasant female patient who presents with the following:  She was at the hospital on 04/17/12 through 04/19/12 with abdominal pain; she had N/V, a suspected gastric malignancy as well as a small pericardiac effusion.  She was stabilized and released with her condition improved.   A CT was concerning for gastric cancer or lymphoma.  They plan to do a further study on 04/30/12- she will have a colonoscopy and ?some other sort of biopsy.    This am she awoke with her left eye and left hand swollen.  She was sent home from the hospital with 2 new medications- MS contin and zofran.    She reports she has had swelling like this on 3 other occasions in the last 2 years- most recently she was here on 03/1912 with right hand swelling and itching.  She was given zyrtec and zantac, as well as PO benadryl to take at home prn.  She does not have any SOB or wheezing, no difficulty breathing or swallowing.  She notes that her lower lip seemed to be swollen when she was admitted- this is now better.  She thought this was due to biting her lip.   She states that she has also had eye swelling in the past prior to starting either MS contin or zofran  She has not taken her zantac yet today  Patient Active Problem List  Diagnosis  . Depression  . GERD (gastroesophageal reflux disease)  . Osteoarthritis  . Cervical neck pain with evidence of disc disease  . Fibromyalgia  . Community acquired pneumonia  . Back pain  . COPD (chronic obstructive pulmonary disease)  . Pericardial effusion  . Nonspecific (abnormal) findings on radiological and other examination of gastrointestinal  tract  . Unspecified gastritis and gastroduodenitis without mention of hemorrhage    Past Medical History  Diagnosis Date  . COPD (chronic obstructive pulmonary disease)     Past Surgical History  Procedure Date  . Abdominal hysterectomy   . Esophagogastroduodenoscopy 04/18/2012    Procedure: ESOPHAGOGASTRODUODENOSCOPY (EGD);  Surgeon: [name], MD;  Location: [facility];  Service: Endoscopy;  Laterality: N/A;    History  Substance Use Topics  . Smoking status: Former Smoker -- 0.5 packs/day for 35 years    Types: Cigarettes    Start date: 01/04/2003    Quit date: 04/17/2005  . Smokeless tobacco: Never Used  . Alcohol Use: No    No family history on file.  Allergies  Allergen Reactions  . Avelox (Moxifloxacin Hcl In Nacl) Nausea And Vomiting  . Alendronate Sodium Nausea And Vomiting  . Aspirin Nausea Only  . Codeine Nausea And Vomiting  . Doxycycline   . Fluconazole   . Neurontin (Gabapentin) Other (See Comments)    Mood changes   . Sertraline Hcl Other (See Comments)    Hallucinations   . Sulfa Antibiotics Rash    Medication list has been reviewed and updated.  Current Outpatient Prescriptions on File Prior to Visit  Medication Sig Dispense Refill  . Ascorbic Acid (VITAMIN C PO) Take 1 tablet by mouth daily.      Marland Kitchen  FLUoxetine (PROZAC) 20 MG capsule Take 20 mg by mouth daily.      Marland Kitchen morphine (MSIR) 15 MG tablet Take 1 tablet (15 mg total) by mouth every 4 (four) hours as needed for pain.  50 tablet  0  . Multiple Vitamin (MULTIVITAMIN) tablet Take 1 tablet by mouth daily.      . ondansetron (ZOFRAN ODT) 4 MG disintegrating tablet Take 1 tablet (4 mg total) by mouth every 6 (six) hours as needed for nausea.  30 tablet  0  . ranitidine (ZANTAC) 150 MG tablet Take 1 tablet (150 mg total) by mouth 2 (two) times daily.  60 tablet  0  . triazolam (HALCION) 0.25 MG tablet Take 0.25 mg by mouth at bedtime as needed. For sleep.      . hydrocodone-acetaminophen  (HYCET) 7.5-325 MG/15ML solution Take 5 mLs by mouth every 6 (six) hours as needed for pain (or cough).  240 mL  0    Review of Systems:  As per HPI- otherwise negative.   Physical Examination: Filed Vitals:   04/22/12 0905  BP: 128/69  Pulse: 83  Temp: 98.3 F (36.8 C)  Resp: 17   Filed Vitals:   04/22/12 0905  Height: 5' 5.5" (1.664 m)  Weight: 97 lb (43.999 kg)   Body mass index is 15.90 kg/(m^2). Ideal Body Weight: Weight in (lb) to have BMI = 25: 152.2   GEN: WDWN, NAD, Non-toxic, A & O x 3, thin build HEENT: Atraumatic, Normocephalic. Neck supple. No masses, No LAD.  Left upper eyelid is edematous, conjunctivae normal, PEERL, EOMI.  Oropharynx wnl, no edema, no lip swelling appreciated Ears and Nose: No external deformity. CV: RRR, No M/G/R. No JVD. No thrill. No extra heart sounds. PULM: CTA B, no wheezes, crackles, rhonchi. No retractions. No resp. distress. No accessory muscle use. ABD: S, NT, ND, +BS. No rebound. No HSM. EXTR: No c/c/e.  Her left hand has some mild swelling, redness and excoriations along the dorsal side.   NEURO Normal gait.  PSYCH: Normally interactive. Conversant. Not depressed or anxious appearing.  Calm demeanor.   Given zantac 150 mg and zyrtec 10 mg by mouth- not able to enter in Epic as they are not formulary medications.  Checked her about 20 minutes later- hand symptoms especially are already better, less itchy and red.  .  Assessment and Plan: 1. Abdominal mass    2. Allergic reaction  EPINEPHrine (EPIPEN) 0.3 mg/0.3 mL DEVI   Tanaja is in the midst of an evaluation of a gastric problem- severe gastritis vs malignancy.  She is here today with an allergic reaction/ swelling of her left eye and left hand.  She responded to zantac and zyrtec here at clinic.  As her symptoms are confined to her eye and hand, and she has possible severe gastritis will not use prednisone at this time.  Did give her an rx for an epipen and discussed use of this  medication.    Discussed trying to change her to another pain medication in case she could be allergic to MS contin (however this is less likely as she had the symptoms in the past as well). However, she states that hydrocodone bothers her stomach and she has a history of hives with codeine.  She will try and not take her MS contin unless she needs it for pain.  She will also try to avoid her zofran if possible.    Discussed when to seek emergency care- if any swelling  of her lips, tongue or mouth, or if any difficulty breathing  COPLAND,JESSICA, MD

## 2012-04-24 LAB — CULTURE, BLOOD (ROUTINE X 2)

## 2012-04-30 ENCOUNTER — Ambulatory Visit (INDEPENDENT_AMBULATORY_CARE_PROVIDER_SITE_OTHER): Payer: Medicare Other | Admitting: Surgery

## 2012-04-30 ENCOUNTER — Encounter (INDEPENDENT_AMBULATORY_CARE_PROVIDER_SITE_OTHER): Payer: Self-pay | Admitting: Surgery

## 2012-04-30 VITALS — BP 110/68 | HR 64 | Temp 96.4°F | Resp 16 | Ht 64.0 in | Wt 94.0 lb

## 2012-04-30 DIAGNOSIS — R1906 Epigastric swelling, mass or lump: Secondary | ICD-10-CM

## 2012-04-30 NOTE — Progress Notes (Signed)
Patient ID: Cynthia Bowen, female   DOB: Apr 06, 1946, 67 y.o.   MRN: [id number]  Chief Complaint  Patient presents with  . New Evaluation    eval abd mass    HPI [name] is a 67 y.o. female.  Referred by Dr. Merla Riches for evaluation of abdominal wall mass HPI This is a 67 year old female who initially palpated a mass in her epigastrium about 6 weeks ago. This occasionally becomes tender. She had a another mass on her ear and one on her clavicle. The one on the clavicle has resolved. The one on the ear tends to fluctuate in size. After the patient was evaluated in early December and was referred to our office, she developed severe nausea and epigastric discomfort. She was hospitalized and a CT scan showed thickening of the distal stomach and antrum. She is undergoing workup by Dr. Arlyce Dice of GI and has an appointment in a few weeks to get set up for an endoscopic ultrasound of this area to rule out malignancy. Mucosal biopsies were negative.  She continues to have some nausea but is able to tolerate a diet. She states that the mass in her epigastrium has not really changed in size.  Past Medical History  Diagnosis Date  . COPD (chronic obstructive pulmonary disease)     Past Surgical History  Procedure Date  . Abdominal hysterectomy   . Esophagogastroduodenoscopy 04/18/2012    Procedure: ESOPHAGOGASTRODUODENOSCOPY (EGD);  Surgeon: [name], MD;  Location: [facility];  Service: Endoscopy;  Laterality: N/A;    History reviewed. No pertinent family history.  Social History History  Substance Use Topics  . Smoking status: Former Smoker -- 0.5 packs/day for 35 years    Types: Cigarettes    Start date: 01/04/2003    Quit date: 04/17/2005  . Smokeless tobacco: Never Used  . Alcohol Use: No    Allergies  Allergen Reactions  . Avelox (Moxifloxacin Hcl In Nacl) Nausea And Vomiting  . Alendronate Sodium Nausea And Vomiting  . Aspirin Nausea Only  . Codeine Nausea And  Vomiting  . Doxycycline   . Fluconazole   . Neurontin (Gabapentin) Other (See Comments)    Mood changes   . Sertraline Hcl Other (See Comments)    Hallucinations   . Sulfa Antibiotics Rash    Current Outpatient Prescriptions  Medication Sig Dispense Refill  . Ascorbic Acid (VITAMIN C PO) Take 1 tablet by mouth daily.      Marland Kitchen EPINEPHrine (EPIPEN) 0.3 mg/0.3 mL DEVI Inject 0.3 mLs (0.3 mg total) into the muscle once.  1 Device  2  . FLUoxetine (PROZAC) 20 MG capsule Take 20 mg by mouth daily.      Marland Kitchen morphine (MSIR) 15 MG tablet Take 1 tablet (15 mg total) by mouth every 4 (four) hours as needed for pain.  50 tablet  0  . Multiple Vitamin (MULTIVITAMIN) tablet Take 1 tablet by mouth daily.      . ondansetron (ZOFRAN ODT) 4 MG disintegrating tablet Take 1 tablet (4 mg total) by mouth every 6 (six) hours as needed for nausea.  30 tablet  0  . ranitidine (ZANTAC) 150 MG tablet Take 1 tablet (150 mg total) by mouth 2 (two) times daily.  60 tablet  0  . triazolam (HALCION) 0.25 MG tablet Take 0.25 mg by mouth at bedtime as needed. For sleep.      . hydrocodone-acetaminophen (HYCET) 7.5-325 MG/15ML solution Take 5 mLs by mouth every 6 (six) hours as needed  for pain (or cough).  240 mL  0    Review of Systems Review of Systems  Constitutional: Negative for fever, chills and unexpected weight change.  HENT: Negative for hearing loss, congestion, sore throat, trouble swallowing and voice change.   Eyes: Negative for visual disturbance.  Respiratory: Negative for cough and wheezing.   Cardiovascular: Negative for chest pain, palpitations and leg swelling.  Gastrointestinal: Positive for nausea and abdominal pain. Negative for vomiting, diarrhea, constipation, blood in stool, abdominal distention and anal bleeding.  Genitourinary: Negative for hematuria, vaginal bleeding and difficulty urinating.  Musculoskeletal: Negative for arthralgias.  Skin: Negative for rash and wound.  Neurological:  Negative for seizures, syncope and headaches.  Hematological: Negative for adenopathy. Does not bruise/bleed easily.  Psychiatric/Behavioral: Negative for confusion.    Blood pressure 110/68, pulse 64, temperature 96.4 F (35.8 C), temperature source Temporal, resp. rate 16, height 5\' 4"  (1.626 m), weight 94 lb (42.638 kg).  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  However prior to scheduling the surgery I would like her to complete her GI workup. In the event that she would need a larger gastric resection that we can remove this lipoma through the same incision. If her GI workup is negative, I instructed her to call me back to schedule the surgery.  The surgical procedure has been discussed with the patient.  Potential risks, benefits, alternative treatments, and expected outcomes have been explained.  All of the patient's questions at this time have been answered.  The likelihood of reaching the patient's treatment goal is good.  The patient understand the proposed surgical procedure and wishes to proceed.        [name] 04/30/2012, 11:01 AM

## 2012-04-30 NOTE — Patient Instructions (Signed)
Proceed with your work-up by Dr. Arlyce Dice for the nausea and possible mass in the wall of the stomach.  After you have seen him, please call us at [phone] (ask for [facility]) to let us know if you would like to have the mass above your belly button removed in outpatient surgery.

## 2012-05-01 ENCOUNTER — Telehealth: Payer: Self-pay

## 2012-05-01 NOTE — Telephone Encounter (Signed)
LMOM to CB to explain what is wrong with her prescriptions.

## 2012-05-01 NOTE — Telephone Encounter (Signed)
Spoke to patient she is requesting protonix and prozac for 90 days to [facility] pended please advise.

## 2012-05-01 NOTE — Telephone Encounter (Signed)
Ms alles is saying that what we sent to the pharmacy is not the corrrect prescriptions please call patient at [phone]

## 2012-05-02 MED ORDER — PANTOPRAZOLE SODIUM 40 MG PO TBEC: 40.0000 mg | DELAYED_RELEASE_TABLET | Freq: Every day | ORAL | Status: DC

## 2012-05-02 MED ORDER — FLUOXETINE HCL 20 MG PO CAPS: 20.0000 mg | ORAL_CAPSULE | Freq: Every day | ORAL | Status: DC

## 2012-05-02 NOTE — Telephone Encounter (Signed)
Prescriptions sent

## 2012-05-02 NOTE — Telephone Encounter (Signed)
Patient notified and voiced understanding.

## 2012-05-05 ENCOUNTER — Ambulatory Visit (INDEPENDENT_AMBULATORY_CARE_PROVIDER_SITE_OTHER): Payer: Medicare Other | Admitting: Family Medicine

## 2012-05-05 VITALS — BP 104/62 | HR 62 | Temp 98.2°F | Resp 16 | Ht 65.5 in | Wt 93.0 lb

## 2012-05-05 DIAGNOSIS — K219 Gastro-esophageal reflux disease without esophagitis: Secondary | ICD-10-CM

## 2012-05-05 DIAGNOSIS — L299 Pruritus, unspecified: Secondary | ICD-10-CM

## 2012-05-05 DIAGNOSIS — R110 Nausea: Secondary | ICD-10-CM

## 2012-05-05 DIAGNOSIS — L293 Anogenital pruritus, unspecified: Secondary | ICD-10-CM

## 2012-05-05 MED ORDER — ONDANSETRON 4 MG PO TBDP: 4.0000 mg | ORAL_TABLET | Freq: Four times a day (QID) | ORAL | Status: DC | PRN

## 2012-05-05 MED ORDER — TRIAMCINOLONE ACETONIDE 0.1 % EX CREA: TOPICAL_CREAM | Freq: Two times a day (BID) | CUTANEOUS | Status: DC

## 2012-05-05 MED ORDER — GI COCKTAIL ~~LOC~~
30.0000 mL | Freq: Once | ORAL | Status: AC
  Administered 2012-05-05: 30 mL via ORAL

## 2012-05-05 NOTE — Progress Notes (Signed)
[facility] [address], [place] [address] [phone]  Date:  05/05/2012   Name:  NEREYDA BOWLER   DOB:  1945-05-02   MRN:  [id number]  PCP:  Elvina Sidle, MD    Chief Complaint: Nausea and Otalgia   History of Present Illness:  KINDLE STROHMEIER is a 67 y.o. 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  I saw her on 04/22/12 with a possible allergic reaction- her left eye and left hand were swollen and we treated her conservatively with zantac and zyrtec.   She saw Dr. Harlon Flor on 1/8 and was noted to have a probably subcutaneous lipoma above her umbilicus.  She is also seeing Dr. Arlyce Dice for work-up of a possible gastric malignancy. They may do surgery to address both issues at once  She is here today because her right ear is itching.  She just noted this problem this morning, but she has noted problems with a bump on her ear for about 9 months.   She also noted that she still has nausea- however this has not changed.  She has not vomited.  She is eating a little bit- mostly soup.  She is able to tolerate liquids well.  She will see Dr. Arlyce Dice on the 28th of this month for a follow- up after a endoscopy.    Patient Active Problem List  Diagnosis  . Depression  . GERD (gastroesophageal reflux disease)  . Osteoarthritis  . Cervical neck pain with evidence of disc disease  . Fibromyalgia  . Community acquired pneumonia  . Back pain  . COPD (chronic obstructive pulmonary disease)  . Pericardial effusion  . Nonspecific (abnormal) findings on radiological and other examination of gastrointestinal tract  . Unspecified gastritis and gastroduodenitis without mention of hemorrhage  . Abdominal wall mass of epigastric region - 2 cm subcutaneous    Past Medical History  Diagnosis Date  . COPD (chronic obstructive  pulmonary disease)     Past Surgical History  Procedure Date  . Abdominal hysterectomy   . Esophagogastroduodenoscopy 04/18/2012    Procedure: ESOPHAGOGASTRODUODENOSCOPY (EGD);  Surgeon: [name], MD;  Location: [facility];  Service: Endoscopy;  Laterality: N/A;    History  Substance Use Topics  . Smoking status: Former Smoker -- 0.5 packs/day for 35 years    Types: Cigarettes    Start date: 01/04/2003    Quit date: 04/17/2005  . Smokeless tobacco: Never Used  . Alcohol Use: No    No family history on file.  Allergies  Allergen Reactions  . Avelox (Moxifloxacin Hcl In Nacl) Nausea And Vomiting  . Alendronate Sodium Nausea And Vomiting  . Aspirin Nausea Only  . Codeine Nausea And Vomiting  . Doxycycline   . Fluconazole   . Neurontin (Gabapentin) Other (See Comments)    Mood changes   . Sertraline Hcl Other (See Comments)    Hallucinations   . Sulfa Antibiotics Rash    Medication list has been reviewed and updated.  Current Outpatient Prescriptions on File Prior to Visit  Medication Sig Dispense Refill  . Ascorbic Acid (VITAMIN C PO) Take 1 tablet by mouth daily.      Marland Kitchen EPINEPHrine (EPIPEN) 0.3 mg/0.3 mL DEVI Inject 0.3 mLs (0.3 mg total) into the muscle once.  1 Device  2  . FLUoxetine (PROZAC) 20 MG capsule Take 1  capsule (20 mg total) by mouth daily.  90 capsule  0  . hydrocodone-acetaminophen (HYCET) 7.5-325 MG/15ML solution Take 5 mLs by mouth every 6 (six) hours as needed for pain (or cough).  240 mL  0  . morphine (MSIR) 15 MG tablet Take 1 tablet (15 mg total) by mouth every 4 (four) hours as needed for pain.  50 tablet  0  . Multiple Vitamin (MULTIVITAMIN) tablet Take 1 tablet by mouth daily.      . ondansetron (ZOFRAN ODT) 4 MG disintegrating tablet Take 1 tablet (4 mg total) by mouth every 6 (six) hours as needed for nausea.  30 tablet  0  . pantoprazole (PROTONIX) 40 MG tablet Take 1 tablet (40 mg total) by mouth daily.  90 tablet  0  . ranitidine  (ZANTAC) 150 MG tablet Take 1 tablet (150 mg total) by mouth 2 (two) times daily.  60 tablet  0  . triazolam (HALCION) 0.25 MG tablet Take 0.25 mg by mouth at bedtime as needed. For sleep.        Review of Systems:  As per HPI- otherwise negative. She continues to have nausea, but this is stable  Physical Examination: Filed Vitals:   05/05/12 1036  BP: 97/62  Pulse: 80  Temp: 98.2 F (36.8 C)  Resp: 16   Filed Vitals:   05/05/12 1036  Height: 5' 5.5" (1.664 m)  Weight: 93 lb (42.185 kg)   Body mass index is 15.24 kg/(m^2). Ideal Body Weight: Weight in (lb) to have BMI = 25: 152.2   GEN: WDWN, NAD, Non-toxic, A & O x 3, thin build with bitemporal wasting,   HEENT: Atraumatic, Normocephalic. Neck supple. No masses, No LAD. Ears and Nose: No external deformity. CV: RRR, No M/G/R. No JVD. No thrill. No extra heart sounds. PULM: CTA B, no wheezes, crackles, rhonchi. No retractions. No resp. distress. No accessory muscle use. ABD: S, NT, ND, +BS. No rebound. No HSM. Small palpable mass under the skin superior to her umbilicus.  EXTR: No c/c/e NEURO Normal gait.  PSYCH: Normally interactive. Conversant. Not depressed or anxious appearing.  Calm demeanor.  Right external ear is slightly inflamed, but no sign of infection, no swelling  Assessment and Plan: 1. GERD (gastroesophageal reflux disease)  gi cocktail (Maalox,Lidocaine,Donnatal)  2. Ear itching  triamcinolone cream (KENALOG) 0.1 %  3. Nausea  ondansetron (ZOFRAN ODT) 4 MG disintegrating tablet   Refilled her zofran to use as neded for GERD/ nausea, and triamcinolone for ear problem.   She will see GI later this month and will plan the next step then.    Abbe Amsterdam, MD  Went back into room to let Ms. Hickle go and found her clutching her epigastirc area and crying.  She stated she gets this sort of pain up to 3x a day, and has done so for the last year.   Gave her a dose of GI cocktail- this resolved her pain and she  felt much better.   Performed EKG: compared with EKG from 04/17/12.  No significant change.  She has low- voltage which is unchanged, no ST elevation or depression.  She reports that her CP is resolved after taking GI cocktail.  She also reports that these symptoms are not at all new.  Her pain seems related to her stomach and GERD, and not to her heart.  Rechecked her VS after this episode- see 2nd set of vitals, normal.

## 2012-05-05 NOTE — Patient Instructions (Addendum)
Work on eating a little bit at a time frequently.  You may have good luck with boost or ensure shakes- we would like you to gain a few pounds if possible!    Use the cream as needed for your ear.  It if is not better please let me know.    If your abdominal/ chest pain comes back and does not go away call [phone]

## 2012-05-06 ENCOUNTER — Telehealth: Payer: Self-pay

## 2012-05-06 DIAGNOSIS — K3189 Other diseases of stomach and duodenum: Secondary | ICD-10-CM

## 2012-05-06 NOTE — Telephone Encounter (Signed)
upper EUS, radial +/- linear for gastric mass. + MAC, propofol. Next available EUS Thursday

## 2012-05-07 ENCOUNTER — Other Ambulatory Visit: Payer: Self-pay

## 2012-05-07 DIAGNOSIS — K3189 Other diseases of stomach and duodenum: Secondary | ICD-10-CM

## 2012-05-07 NOTE — Telephone Encounter (Signed)
Pt has been instructed and meds reviewed pt will call with any questions or concerns

## 2012-05-16 ENCOUNTER — Encounter (HOSPITAL_COMMUNITY): Payer: Self-pay | Admitting: *Deleted

## 2012-05-16 NOTE — Pre-Procedure Instructions (Signed)
Your procedure is scheduled ZO:[id number], May 29, 2012 Report to [facility] Admitting at:1030 Call this number if you have problems morning of your procedure:[phone]  Follow all bowel prep instructions per your doctor's orders.  Do not eat or drink anything after midnight the night before your procedure. You may brush your teeth, rinse out your mouth, but no water, no food, no chewing gum, no mints, no candies, no chewing tobacco.     Take these medicines the morning of your procedure with A SIP OF WATER:Protonix,Prozac and Zantac and may take pain medication if needed   Please make arrangements for a responsible person to drive you home after the procedure. You cannot go home by cab/taxi. We recommend you have someone with you at home the first 24 hours after your procedure. Driver for procedure is sister Elvera Lennox [phone]  LEAVE ALL VALUABLES, JEWELRY, BILLFOLD AT HOME.  NO DENTURES, CONTACT LENSES ALLOWED IN THE ENDOSCOPY ROOM.   YOU MAY WEAR DEODORANT, PLEASE REMOVE ALL JEWELRY, WATCHES RINGS, BODY PIERCINGS AND LEAVE AT HOME.   WOMEN: NO MAKE-UP, LOTIONS PERFUMES

## 2012-05-19 ENCOUNTER — Telehealth: Payer: Self-pay

## 2012-05-19 MED ORDER — RANITIDINE HCL 150 MG PO TABS: 150.0000 mg | ORAL_TABLET | Freq: Two times a day (BID) | ORAL | Status: DC

## 2012-05-19 NOTE — Telephone Encounter (Signed)
PT STATES SHE HAD CALLED REGARDING HER PRESCRIPTION FOR ZANTAC AND MORPHINE WHICH HAVE TO BE WRITTEN. SHE USES 2 DIFFERENT PHARMACIES PLEASE CALL [phone]

## 2012-05-19 NOTE — Telephone Encounter (Signed)
I have spoken to patient about her meds, she states she will call her surgeon Dr [name] about the Morphine. Dr Patsy Lager did approve the Zantac for her I have sent this in for her.

## 2012-05-19 NOTE — Telephone Encounter (Signed)
Called pt, she states the pharmacy will not transfer the RX's. She needs a hard copy RX of the Morphine and another Rx for Zantac to [facility] on [address].

## 2012-05-20 ENCOUNTER — Ambulatory Visit (INDEPENDENT_AMBULATORY_CARE_PROVIDER_SITE_OTHER): Payer: Medicare Other | Admitting: Gastroenterology

## 2012-05-20 ENCOUNTER — Encounter: Payer: Self-pay | Admitting: Gastroenterology

## 2012-05-20 VITALS — BP 102/60 | HR 98 | Ht 64.0 in | Wt 96.0 lb

## 2012-05-20 DIAGNOSIS — R1906 Epigastric swelling, mass or lump: Secondary | ICD-10-CM

## 2012-05-20 DIAGNOSIS — R109 Unspecified abdominal pain: Secondary | ICD-10-CM

## 2012-05-20 DIAGNOSIS — R933 Abnormal findings on diagnostic imaging of other parts of digestive tract: Secondary | ICD-10-CM

## 2012-05-20 MED ORDER — HYDROCODONE-ACETAMINOPHEN 5-500 MG PO TABS: 1.0000 | ORAL_TABLET | ORAL | Status: DC | PRN

## 2012-05-20 NOTE — Assessment & Plan Note (Signed)
This could be related to an infiltrative process of the stomach. Plan EUS

## 2012-05-20 NOTE — Progress Notes (Signed)
History of Present Illness:  The patient continues to complain of nausea and abdominal pain. She was hospitalized in December where CT demonstrated a thickened gastric antrum worrisome for infiltration of the mucosa or submucosa. Upper endoscopy was negative. She is scheduled for EUS. She has nausea with and without eating and postprandially. She also has moderate spontaneous abdominal pain for which she takes oral morphine. She has a very painful subcutaneous nodule in the umbilical area.  Weight has been stable.    Past Medical History  Diagnosis Date  . COPD (chronic obstructive pulmonary disease)   . Pneumonia 12-2011  . GERD (gastroesophageal reflux disease)   . Headache   . Arthritis     osteoarthritis   Past Surgical History  Procedure Date  . Abdominal hysterectomy   . Esophagogastroduodenoscopy 04/18/2012    Procedure: ESOPHAGOGASTRODUODENOSCOPY (EGD);  Surgeon: [name], MD;  Location: [facility];  Service: Endoscopy;  Laterality: N/A;   family history includes COPD in her mother and Hypertension in her maternal grandmother. Current Outpatient Prescriptions  Medication Sig Dispense Refill  . Ascorbic Acid (VITAMIN C PO) Take 1 tablet by mouth daily.      Marland Kitchen EPINEPHrine (EPIPEN) 0.3 mg/0.3 mL DEVI Inject 0.3 mLs (0.3 mg total) into the muscle once.  1 Device  2  . FLUoxetine (PROZAC) 20 MG capsule Take 1 capsule (20 mg total) by mouth daily.  90 capsule  0  . Multiple Vitamin (MULTIVITAMIN) tablet Take 1 tablet by mouth daily.      . ondansetron (ZOFRAN ODT) 4 MG disintegrating tablet Take 1 tablet (4 mg total) by mouth every 6 (six) hours as needed for nausea.  30 tablet  0  . pantoprazole (PROTONIX) 40 MG tablet Take 1 tablet (40 mg total) by mouth daily.  90 tablet  0  . ranitidine (ZANTAC) 150 MG tablet Take 1 tablet (150 mg total) by mouth 2 (two) times daily.  60 tablet  2  . triamcinolone cream (KENALOG) 0.1 % Apply topically 2 (two) times daily.  30 g  0  .  triazolam (HALCION) 0.25 MG tablet Take 0.25 mg by mouth at bedtime as needed. For sleep.       Allergies as of 05/20/2012 - Review Complete 05/20/2012  Allergen Reaction Noted  . Avelox (moxifloxacin hcl in nacl) Nausea And Vomiting 01/04/2012  . Alendronate sodium Nausea And Vomiting 06/12/2011  . Aspirin Nausea Only 06/12/2011  . Codeine Nausea And Vomiting 06/12/2011  . Doxycycline  06/12/2011  . Fluconazole  06/12/2011  . Neurontin (gabapentin) Other (See Comments) 06/12/2011  . Sertraline hcl Other (See Comments) 06/12/2011  . Sulfa antibiotics Rash 06/12/2011    reports that she quit smoking about 7 years ago. Her smoking use included Cigarettes. She started smoking about 9 years ago. She has a 17.5 pack-year smoking history. She has never used smokeless tobacco. She reports that she does not drink alcohol or use illicit drugs.     Review of Systems: Pertinent positive and negative review of systems were noted in the above HPI section. All other review of systems were otherwise negative.  Vital signs were reviewed in today's medical record Physical Exam: General: She is a thin female in no acute distress On abdominal exam again noted is a focal tender movable subcutaneous nodule just superior to the umbilicus measuring 1 x 2 cm. There is mild tenderness to palpation in the midepigastrium and periumbilical area without guarding or rebound. There are no abdominal masses  or organomegaly

## 2012-05-20 NOTE — Patient Instructions (Addendum)
We are printing you a Vicodin prescription today Keep your scheduled appointment for your procedure with Dr Christella Hartigan

## 2012-05-20 NOTE — Assessment & Plan Note (Signed)
Abdominal CT demonstrates concentric thickening of the gastric antrum raising concern for an infiltrative process.  Patient scheduled for EUS

## 2012-05-20 NOTE — Assessment & Plan Note (Signed)
There is a tender subcutaneous nodule. She's been seen by surgery. Further workup is on hold pending results of her EUS

## 2012-05-21 ENCOUNTER — Encounter (HOSPITAL_COMMUNITY): Payer: Self-pay | Admitting: Pharmacy Technician

## 2012-05-23 ENCOUNTER — Ambulatory Visit (INDEPENDENT_AMBULATORY_CARE_PROVIDER_SITE_OTHER): Payer: Medicare Other | Admitting: Internal Medicine

## 2012-05-23 VITALS — BP 117/70 | HR 88 | Temp 99.1°F | Resp 18 | Wt 99.0 lb

## 2012-05-23 DIAGNOSIS — R110 Nausea: Secondary | ICD-10-CM

## 2012-05-23 DIAGNOSIS — IMO0001 Reserved for inherently not codable concepts without codable children: Secondary | ICD-10-CM

## 2012-05-23 DIAGNOSIS — I313 Pericardial effusion (noninflammatory): Secondary | ICD-10-CM

## 2012-05-23 DIAGNOSIS — R1906 Epigastric swelling, mass or lump: Secondary | ICD-10-CM

## 2012-05-23 DIAGNOSIS — R109 Unspecified abdominal pain: Secondary | ICD-10-CM

## 2012-05-23 DIAGNOSIS — M797 Fibromyalgia: Secondary | ICD-10-CM

## 2012-05-23 MED ORDER — CILIDINIUM-CHLORDIAZEPOXIDE 2.5-5 MG PO CAPS: 1.0000 | ORAL_CAPSULE | Freq: Three times a day (TID) | ORAL | Status: DC

## 2012-05-23 MED ORDER — ONDANSETRON 8 MG PO TBDP: 8.0000 mg | ORAL_TABLET | Freq: Three times a day (TID) | ORAL | Status: DC | PRN

## 2012-05-23 NOTE — Progress Notes (Signed)
Subjective:    Patient ID: Cynthia Bowen, female    DOB: 15-Sep-1945, 67 y.o.   MRN: [id number]  HPI follows up for problems with abdominal symptoms of pain with nausea and vomit and weight loss. -continues w/ nausea early am starts/this adds to pain epigastric which is there all the time Vomits 3 times a day-no relat to food or meds HC makes her worse whethewr liquid or pill so she is not taking current prescriptions for pain control She is very frustrated with her lack of a diagnosis and her lack of improvement   Recent hospitalization December 2013 Discharge Diagnoses:  # Abdominal pain , N/V, weight loss- suspected malignancy  (a) CT Shows thickened stomach antrum suspicious for gastric Ca or lymphoma or gastritis  ( a) s/p endoscopy with biopsy with results pending but expected to have low diagnostic yield  ( b) EUS planned as outpatient  # Small pericardial effusion  # Leucocytosis  # COPD  # Depression  # OA  GI evaluation to date reveals significant gastritis by EGD-she has been prescribed Protonix and Zantac, but notices little change. Zofran does help her nausea. Despite little intake there's been no weight loss. No diarrhea constipation/no blood in stools/labs have appeared stable Surgical evaluation of the belly regarding an epigastric mass as suggested possible surgery awaiting completing a GI workup. Cardiology evaluation of the pericardial effusion with echo showed a trivial effusion with mild pulmonary hypertension  2/6 ? appt w-l outpat-she is unsure who this appointment is with or what it's about  PMH- Patient Active Problem List  Diagnosis  . Depression--stable on Prozac /increased anxiety with current pain   . GERD (gastroesophageal reflux disease)-depended on Protonix   . Osteoarthritis  . Cervical neck pain with evidence of disc disease  . Fibromyalgia  . Community acquired pneumonia-this is an old diagnosis /chest x-ray on admission suggested bronchitis    . Back pain  . COPD (chronic obstructive pulmonary disease)  . Pericardial effusion  . Nonspecific (abnormal) findings on radiological and other examination of gastrointestinal tract  . Unspecified gastritis and gastroduodenitis without mention of hemorrhage  . Abdominal wall mass of epigastric region - 2 cm subcutaneous   Social history-retired with unapproved disability from arthritis  excessive alcohol intake -stop drinking 3 years ago    Review of Systems Weight= from 92 pounds to 102 pounds over the past year  No fever or night sweats  No change in exercise tolerance  No palpitations or chest pain  No genitourinary symptoms  No edema  Recent skin changes thought secondary to allergic reaction /C. January outpatient visit      Objective:   Physical Exam BP 117/70  Pulse 88  Temp 99.1 F (37.3 C) (Oral)  Resp 18  Wt 99 lb (44.906 kg) No acute distress although frustrated Her weight has improved Heart regular without murmur Epigastric lump as noted         Assessment & Plan:   1. Nausea  ondansetron (ZOFRAN-ODT) 8 MG disintegrating tablet  2. Abdominal wall mass of epigastric region - 2 cm subcutaneous    3. Abdominal pain    4. Fibromyalgia    5  mild pulmonary hypertension by echo     Although this presentation is certainly suggestive of an underlying malignancy, all tests have been reassuring thus far. Librax is ordered to add to Zofran along with Protonix and Zantac to see if gastrointestinal symptoms can be controlled(her past history is significant for anxiety associated  with her depression) Follow up exam here in 3-4 weeks

## 2012-05-29 ENCOUNTER — Encounter (HOSPITAL_COMMUNITY): Payer: Self-pay | Admitting: Anesthesiology

## 2012-05-29 ENCOUNTER — Ambulatory Visit (HOSPITAL_COMMUNITY)
Admission: RE | Admit: 2012-05-29 | Discharge: 2012-05-29 | Disposition: A | Discharge status: Home / Self Care | Payer: Medicare Other | Source: Ambulatory Visit | Attending: Gastroenterology | Admitting: Gastroenterology

## 2012-05-29 ENCOUNTER — Encounter (HOSPITAL_COMMUNITY): Payer: Self-pay | Admitting: *Deleted

## 2012-05-29 ENCOUNTER — Encounter (HOSPITAL_COMMUNITY)
Admission: RE | Disposition: A | Discharge status: Home / Self Care | Payer: Self-pay | Source: Ambulatory Visit | Attending: Gastroenterology

## 2012-05-29 ENCOUNTER — Encounter (HOSPITAL_COMMUNITY): Payer: Self-pay | Admitting: Gastroenterology

## 2012-05-29 ENCOUNTER — Ambulatory Visit (HOSPITAL_COMMUNITY): Payer: Medicare Other | Admitting: Anesthesiology

## 2012-05-29 DIAGNOSIS — J4489 Other specified chronic obstructive pulmonary disease: Secondary | ICD-10-CM | POA: Insufficient documentation

## 2012-05-29 DIAGNOSIS — J449 Chronic obstructive pulmonary disease, unspecified: Secondary | ICD-10-CM | POA: Insufficient documentation

## 2012-05-29 DIAGNOSIS — Z87891 Personal history of nicotine dependence: Secondary | ICD-10-CM | POA: Insufficient documentation

## 2012-05-29 DIAGNOSIS — R933 Abnormal findings on diagnostic imaging of other parts of digestive tract: Secondary | ICD-10-CM | POA: Insufficient documentation

## 2012-05-29 DIAGNOSIS — K319 Disease of stomach and duodenum, unspecified: Secondary | ICD-10-CM

## 2012-05-29 DIAGNOSIS — K219 Gastro-esophageal reflux disease without esophagitis: Secondary | ICD-10-CM | POA: Insufficient documentation

## 2012-05-29 DIAGNOSIS — K3189 Other diseases of stomach and duodenum: Secondary | ICD-10-CM

## 2012-05-29 DIAGNOSIS — R112 Nausea with vomiting, unspecified: Secondary | ICD-10-CM | POA: Insufficient documentation

## 2012-05-29 HISTORY — PX: EUS: SHX5427

## 2012-05-29 HISTORY — DX: Unspecified osteoarthritis, unspecified site: M19.90

## 2012-05-29 HISTORY — DX: Pneumonia, unspecified organism: J18.9

## 2012-05-29 HISTORY — DX: Headache: R51

## 2012-05-29 HISTORY — DX: Gastro-esophageal reflux disease without esophagitis: K21.9

## 2012-05-29 LAB — CEA: CEA: 3.2 ng/mL (ref 0.0–5.0)

## 2012-05-29 SURGERY — UPPER ENDOSCOPIC ULTRASOUND (EUS) LINEAR
Anesthesia: Monitor Anesthesia Care

## 2012-05-29 MED ORDER — PROPOFOL 10 MG/ML IV EMUL
INTRAVENOUS | Status: DC | PRN
  Administered 2012-05-29: 140 ug/kg/min via INTRAVENOUS

## 2012-05-29 MED ORDER — SODIUM CHLORIDE 0.9 % IV SOLN
INTRAVENOUS | Status: DC
  Administered 2012-05-29: 11:00:00 via INTRAVENOUS

## 2012-05-29 MED ORDER — METOCLOPRAMIDE HCL 5 MG/ML IJ SOLN: 10.0000 mg | Freq: Once | INTRAMUSCULAR | Status: DC | PRN

## 2012-05-29 MED ORDER — BUTAMBEN-TETRACAINE-BENZOCAINE 2-2-14 % EX AERO
INHALATION_SPRAY | CUTANEOUS | Status: DC | PRN
  Administered 2012-05-29: 2 via TOPICAL

## 2012-05-29 MED ORDER — KETAMINE HCL 10 MG/ML IJ SOLN
INTRAMUSCULAR | Status: DC | PRN
  Administered 2012-05-29 (×3): 10 mg via INTRAVENOUS

## 2012-05-29 MED ORDER — FENTANYL CITRATE 0.05 MG/ML IJ SOLN
INTRAMUSCULAR | Status: DC | PRN
  Administered 2012-05-29: 50 ug via INTRAVENOUS

## 2012-05-29 MED ORDER — MIDAZOLAM HCL 5 MG/5ML IJ SOLN
INTRAMUSCULAR | Status: DC | PRN
  Administered 2012-05-29: 2 mg via INTRAVENOUS

## 2012-05-29 NOTE — Anesthesia Preprocedure Evaluation (Signed)
Anesthesia Evaluation  Patient identified by MRN, date of birth, ID band Patient awake    Reviewed: Allergy & Precautions, H&P , NPO status , Patient's Chart, lab work & pertinent test results, reviewed documented beta blocker date and time   Airway Mallampati: II TM Distance: >3 FB Neck ROM: full    Dental   Pulmonary pneumonia -, resolved, COPD COPD inhaler,  breath sounds clear to auscultation        Cardiovascular negative cardio ROS  Rhythm:regular     Neuro/Psych  Headaches, PSYCHIATRIC DISORDERS  Neuromuscular disease    GI/Hepatic Neg liver ROS, GERD-  Medicated,  Endo/Other  negative endocrine ROS  Renal/GU negative Renal ROS  negative genitourinary   Musculoskeletal   Abdominal   Peds  Hematology negative hematology ROS (+)   Anesthesia Other Findings See surgeon's H&P   Reproductive/Obstetrics negative OB ROS                           Anesthesia Physical Anesthesia Plan  ASA: III  Anesthesia Plan: MAC   Post-op Pain Management:    Induction: Intravenous  Airway Management Planned: Nasal Cannula and Simple Face Mask  Additional Equipment:   Intra-op Plan:   Post-operative Plan:   Informed Consent: I have reviewed the patients History and Physical, chart, labs and discussed the procedure including the risks, benefits and alternatives for the proposed anesthesia with the patient or authorized representative who has indicated his/her understanding and acceptance.   Dental Advisory Given  Plan Discussed with: CRNA and Surgeon  Anesthesia Plan Comments:         Anesthesia Quick Evaluation

## 2012-05-29 NOTE — H&P (View-Only) (Signed)
History of Present Illness:  The patient continues to complain of nausea and abdominal pain. She was hospitalized in December where CT demonstrated a thickened gastric antrum worrisome for infiltration of the mucosa or submucosa. Upper endoscopy was negative. She is scheduled for EUS. She has nausea with and without eating and postprandially. She also has moderate spontaneous abdominal pain for which she takes oral morphine. She has a very painful subcutaneous nodule in the umbilical area.  Weight has been stable.    Past Medical History  Diagnosis Date  . COPD (chronic obstructive pulmonary disease)   . Pneumonia 12-2011  . GERD (gastroesophageal reflux disease)   . Headache   . Arthritis     osteoarthritis   Past Surgical History  Procedure Date  . Abdominal hysterectomy   . Esophagogastroduodenoscopy 04/18/2012    Procedure: ESOPHAGOGASTRODUODENOSCOPY (EGD);  Surgeon: Robert D Kaplan, MD;  Location: [facility];  Service: Endoscopy;  Laterality: N/A;   family history includes COPD in her mother and Hypertension in her maternal grandmother. Current Outpatient Prescriptions  Medication Sig Dispense Refill  . Ascorbic Acid (VITAMIN C PO) Take 1 tablet by mouth daily.      . EPINEPHrine (EPIPEN) 0.3 mg/0.3 mL DEVI Inject 0.3 mLs (0.3 mg total) into the muscle once.  1 Device  2  . FLUoxetine (PROZAC) 20 MG capsule Take 1 capsule (20 mg total) by mouth daily.  90 capsule  0  . Multiple Vitamin (MULTIVITAMIN) tablet Take 1 tablet by mouth daily.      . ondansetron (ZOFRAN ODT) 4 MG disintegrating tablet Take 1 tablet (4 mg total) by mouth every 6 (six) hours as needed for nausea.  30 tablet  0  . pantoprazole (PROTONIX) 40 MG tablet Take 1 tablet (40 mg total) by mouth daily.  90 tablet  0  . ranitidine (ZANTAC) 150 MG tablet Take 1 tablet (150 mg total) by mouth 2 (two) times daily.  60 tablet  2  . triamcinolone cream (KENALOG) 0.1 % Apply topically 2 (two) times daily.  30 g  0  .  triazolam (HALCION) 0.25 MG tablet Take 0.25 mg by mouth at bedtime as needed. For sleep.       Allergies as of 05/20/2012 - Review Complete 05/20/2012  Allergen Reaction Noted  . Avelox (moxifloxacin hcl in nacl) Nausea And Vomiting 01/04/2012  . Alendronate sodium Nausea And Vomiting 06/12/2011  . Aspirin Nausea Only 06/12/2011  . Codeine Nausea And Vomiting 06/12/2011  . Doxycycline  06/12/2011  . Fluconazole  06/12/2011  . Neurontin (gabapentin) Other (See Comments) 06/12/2011  . Sertraline hcl Other (See Comments) 06/12/2011  . Sulfa antibiotics Rash 06/12/2011    reports that she quit smoking about 7 years ago. Her smoking use included Cigarettes. She started smoking about 9 years ago. She has a 17.5 pack-year smoking history. She has never used smokeless tobacco. She reports that she does not drink alcohol or use illicit drugs.     Review of Systems: Pertinent positive and negative review of systems were noted in the above HPI section. All other review of systems were otherwise negative.  Vital signs were reviewed in today's medical record Physical Exam: General: She is a thin female in no acute distress On abdominal exam again noted is a focal tender movable subcutaneous nodule just superior to the umbilicus measuring 1 x 2 cm. There is mild tenderness to palpation in the midepigastrium and periumbilical area without guarding or rebound. There are no abdominal masses  or organomegaly        

## 2012-05-29 NOTE — Anesthesia Postprocedure Evaluation (Signed)
Anesthesia Post Note  Patient: Cynthia Bowen  Procedure(s) Performed: Procedure(s) (LRB): UPPER ENDOSCOPIC ULTRASOUND (EUS) LINEAR (N/A)  Anesthesia type: MAC  Patient location: PACU  Post pain: Pain level controlled  Post assessment: Patient's Cardiovascular Status Stable  Last Vitals:  Filed Vitals:   05/29/12 1250  BP: 150/89  Pulse:   Temp:   Resp: 23    Post vital signs: Reviewed and stable  Level of consciousness: alert  Complications: No apparent anesthesia complications

## 2012-05-29 NOTE — Op Note (Signed)
[facility] [address] [place], [address]   ENDOSCOPIC ULTRASOUND PROCEDURE REPORT  PATIENT: Cynthia, Bowen  MR#: [id number] BIRTHDATE: 1945/05/15  GENDER: Female ENDOSCOPIST: [name], MD REFERRED BY:  [name], M.D. PROCEDURE DATE:  05/29/2012 PROCEDURE:   Upper EUS w/FNA ASA CLASS:      Class III INDICATIONS:   nausea, vomiting, very thickened stomach on CT, EGD with Dr.  Arlyce Dice showed essentially normal stomach mucosa. MEDICATIONS: MAC sedation, administered by CRNA  DESCRIPTION OF PROCEDURE:   After the risks benefits and alternatives of the procedure were  explained, informed consent was obtained. The patient was then placed in the left, lateral, decubitus postion and IV sedation was administered. Throughout the procedure, the patients blood pressure, pulse and oxygen saturations were monitored continuously.  Under direct visualization, the Pentax Radial EUS [id number]  endoscope was introduced through the mouth  and advanced to the second portion of the duodenum .  Water was used as necessary to provide an acoustic interface.  Upon completion of the imaging, water was removed and the patient was sent to the recovery room in satisfactory condition.   Endoscopic findings: 1. Normal UGI tract  EUS findings: 1. The wall of stomach was non-specifically thickened distally.  The wall thickness was up to 5-94mm. There were no clear discrete masses however I performed FNA of the thickened wall using two passes of a 25 gauge BS EUS FNA needle with pretty scant aspirate. 2. No perigastric adenopathy. 3. Limited views of pancreas, liver, spleen, portal and splenic vessels were all normal.  Impression: Thickened gastric wall, non-specifically but pretty signficant thickening. FNA of the thickened segment of wall was performed but aspirate was pretty scanty, awaiting final cytology reading.  If this is unrevealing, non-diagnostic I think she  should undergo full thickness biopsy via laparoscopy to check for linitis plastic type infiltration. CEA level was sent from recover, results pending.  _______________________________ eSigned:  [name], MD 05/29/2012 12:34 PM   cc: Marcille Blanco, MD

## 2012-05-29 NOTE — Transfer of Care (Signed)
Immediate Anesthesia Transfer of Care Note  Patient: Cynthia Bowen  Procedure(s) Performed: Procedure(s) (LRB) with comments: UPPER ENDOSCOPIC ULTRASOUND (EUS) LINEAR (N/A)  Patient Location: PACU and Endoscopy Unit  Anesthesia Type:MAC  Level of Consciousness: awake and alert   Airway & Oxygen Therapy: Patient Spontanous Breathing and Patient connected to nasal cannula oxygen  Post-op Assessment: Report given to PACU RN and Post -op Vital signs reviewed and stable  Post vital signs: Reviewed and stable  Complications: No apparent anesthesia complications

## 2012-05-29 NOTE — Interval H&P Note (Signed)
History and Physical Interval Note:  05/29/2012 11:25 AM  Cynthia Bowen  has presented today for surgery, with the diagnosis of Gastric mass [537.9]  The various methods of treatment have been discussed with the patient and family. After consideration of risks, benefits and other options for treatment, the patient has consented to  Procedure(s) (LRB) with comments: UPPER ENDOSCOPIC ULTRASOUND (EUS) LINEAR (N/A) as a surgical intervention .  The patient's history has been reviewed, patient examined, no change in status, stable for surgery.  I have reviewed the patient's chart and labs.  Questions were answered to the patient's satisfaction.     Rob Bunting

## 2012-05-30 ENCOUNTER — Encounter (HOSPITAL_COMMUNITY): Payer: Self-pay | Admitting: Gastroenterology

## 2012-05-30 NOTE — Progress Notes (Signed)
Overbook appt made for 2/19, pt notified.

## 2012-06-07 ENCOUNTER — Other Ambulatory Visit: Payer: Self-pay

## 2012-06-11 ENCOUNTER — Ambulatory Visit (INDEPENDENT_AMBULATORY_CARE_PROVIDER_SITE_OTHER): Payer: Medicare Other | Admitting: Internal Medicine

## 2012-06-11 VITALS — BP 111/76 | HR 99 | Temp 98.1°F | Resp 16 | Ht 65.5 in | Wt 94.0 lb

## 2012-06-11 DIAGNOSIS — R933 Abnormal findings on diagnostic imaging of other parts of digestive tract: Secondary | ICD-10-CM

## 2012-06-11 DIAGNOSIS — R1906 Epigastric swelling, mass or lump: Secondary | ICD-10-CM

## 2012-06-11 DIAGNOSIS — K219 Gastro-esophageal reflux disease without esophagitis: Secondary | ICD-10-CM

## 2012-06-11 DIAGNOSIS — R109 Unspecified abdominal pain: Secondary | ICD-10-CM

## 2012-06-11 NOTE — Progress Notes (Signed)
  Subjective:    Patient ID: Cynthia Bowen, female    DOB: [birth year], 67 y.o.   MRN: [id number]  HPI follows up for prolonged nausea, postprandial distress, episodic vomiting, and intermittent abdominal pain Absolutely no benefit from Librax Symptoms continue to be unpredictable Endoscopy with fine-needle aspiration did not produce a diagnosis Full-thickness biopsy suggested as necessary by Dr Christella Hartigan with referral to Dr. 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 Christella Hartigan so he can go ahead with the referral She will stop Librax, Zantac  continue Protonix / and Zofran when necessary

## 2012-06-16 ENCOUNTER — Telehealth (INDEPENDENT_AMBULATORY_CARE_PROVIDER_SITE_OTHER): Payer: Self-pay | Admitting: General Surgery

## 2012-06-16 NOTE — Telephone Encounter (Signed)
Called patient to make her a follow up apt to come in the see Dr [name] to discuss surgery and I reminder card was mailed to the patient

## 2012-06-16 NOTE — Telephone Encounter (Signed)
Message copied by Wilder Glade on Mon Jun 16, 2012 11:16 AM ------      Message from: Wynona Luna      Created: Mon Jun 16, 2012 10:56 AM      Regarding: FW: pt ready for surgery      Contact: [phone]       I need to see this patient in the office to discuss her procedures prior to scheduling the surgery.            ----- Message -----         From: Jari Sportsman         Sent: 06/16/2012   9:04 AM           To: [name]. [name], MD, Wilder Glade, MA      Subject: pt ready for surgery                                     Patient Cynthia Bowen wants to schedule surgery. She stated Dr Christella Hartigan told her to call our office and schedule.      Please advise      Thanks       Katie        ------

## 2012-06-19 ENCOUNTER — Ambulatory Visit: Payer: Medicare Other

## 2012-06-19 ENCOUNTER — Ambulatory Visit (INDEPENDENT_AMBULATORY_CARE_PROVIDER_SITE_OTHER): Payer: Medicare Other | Admitting: Internal Medicine

## 2012-06-19 VITALS — BP 110/60 | HR 80 | Temp 98.5°F | Resp 16 | Ht 64.5 in | Wt 96.4 lb

## 2012-06-19 DIAGNOSIS — R634 Abnormal weight loss: Secondary | ICD-10-CM | POA: Insufficient documentation

## 2012-06-19 DIAGNOSIS — R05 Cough: Secondary | ICD-10-CM

## 2012-06-19 DIAGNOSIS — J209 Acute bronchitis, unspecified: Secondary | ICD-10-CM

## 2012-06-19 DIAGNOSIS — R630 Anorexia: Secondary | ICD-10-CM | POA: Insufficient documentation

## 2012-06-19 LAB — POCT CBC
MCH, POC: 28.6 pg (ref 27–31.2)
MCHC: 32.1 g/dL (ref 31.8–35.4)
MCV: 88.9 fL (ref 80–97)
MID (cbc): 0.5 (ref 0–0.9)
POC LYMPH PERCENT: 21.7 %L (ref 10–50)
Platelet Count, POC: 391 10*3/uL (ref 142–424)
RDW, POC: 15.4 %
WBC: 9.3 10*3/uL (ref 4.6–10.2)

## 2012-06-19 MED ORDER — AZITHROMYCIN 500 MG PO TABS: 500.0000 mg | ORAL_TABLET | Freq: Every day | ORAL | Status: DC

## 2012-06-19 NOTE — Progress Notes (Signed)
  Subjective:    Patient ID: Cynthia Bowen, female    DOB: Aug 21, 1945, 68 y.o.   MRN: [id number]  HPI New 2day hx of deep productive cough. No fever. Does have anorexia, wt loss, and gi mass that needs surgical evaluation and this is in progress. Former long time smoker.   Review of Systems See problem list    Objective:   Physical Exam  Vitals reviewed. Constitutional: She is oriented to person, place, and time. Vital signs are normal. She appears cachectic. She is cooperative.  HENT:  Nose: Nose normal.  Eyes: EOM are normal. No scleral icterus.  Neck: Normal range of motion.  Cardiovascular: Normal rate, regular rhythm and normal heart sounds.   Pulmonary/Chest: Tachypnea noted. No respiratory distress. She has decreased breath sounds. She has rhonchi.  Musculoskeletal: Normal range of motion.  Neurological: She is alert and oriented to person, place, and time. She exhibits normal muscle tone. Coordination normal.  Skin: Skin is warm and dry.  Psychiatric: She has a normal mood and affect.    [facility] reading (PRIMARY) by  Dr.Kasyn Stouffer. NAD on cxr  Results for orders placed in visit on 06/19/12  POCT CBC      Result Value Range   WBC 9.3  4.6 - 10.2 K/uL   Lymph, poc 2.0  0.6 - 3.4   POC LYMPH PERCENT 21.7  10 - 50 %L   MID (cbc) 0.5  0 - 0.9   POC MID % 5.1  0 - 12 %M   POC Granulocyte 6.8  2 - 6.9   Granulocyte percent 73.2  37 - 80 %G   RBC 4.34  4.04 - 5.48 M/uL   Hemoglobin 12.4  12.2 - 16.2 g/dL   HCT, POC 02.7  25.3 - 47.9 %   MCV 88.9  80 - 97 fL   MCH, POC 28.6  27 - 31.2 pg   MCHC 32.1  31.8 - 35.4 g/dL   RDW, POC 66.4     Platelet Count, POC 391  142 - 424 K/uL   MPV 8.0  0 - 99.8 fL         Assessment & Plan:  Zith/Tesslon

## 2012-06-19 NOTE — Patient Instructions (Signed)

## 2012-06-24 ENCOUNTER — Encounter (INDEPENDENT_AMBULATORY_CARE_PROVIDER_SITE_OTHER): Payer: Medicare Other | Admitting: Surgery

## 2012-06-24 ENCOUNTER — Ambulatory Visit (INDEPENDENT_AMBULATORY_CARE_PROVIDER_SITE_OTHER): Payer: Medicare Other | Admitting: Surgery

## 2012-06-24 ENCOUNTER — Encounter (INDEPENDENT_AMBULATORY_CARE_PROVIDER_SITE_OTHER): Payer: Self-pay | Admitting: Surgery

## 2012-06-24 VITALS — BP 112/80 | HR 70 | Temp 98.2°F | Resp 16 | Ht 64.0 in | Wt 98.2 lb

## 2012-06-24 NOTE — Progress Notes (Signed)
Patient ID: Cynthia Bowen, female   DOB: 11/16/1945, 67 y.o.   MRN: [id number] HPI  Cynthia Bowen is a 67 y.o. female. Referred by Dr. Merla Riches for evaluation of abdominal wall mass GI:  Dr. Rosina Lowenstein for evaluation of gastric wall thickening HPI  This is a 67 year old female who initially palpated a mass in her epigastrium about 6 weeks ago. This occasionally becomes tender. She had a another mass on her ear and one on her clavicle. The one on the clavicle has resolved. The one on the ear tends to fluctuate in size. After the patient was evaluated in early December and was referred to our office, she developed severe nausea and epigastric discomfort. She was hospitalized and a CT scan showed thickening of the distal stomach and antrum. She is undergoing workup by Dr. Arlyce Dice of GI and has an appointment in a few weeks to get set up for an endoscopic ultrasound of this area to rule out malignancy. Mucosal biopsies were negative. She continues to have some nausea but is able to tolerate a diet. She states that the mass in her epigastrium has not really changed in size.   She underwent endoscopic ultrasound and FNA which were unremarkable and showed only non-specific gastric wall thickening. FNA unremarkable.  Past Medical History   Diagnosis  Date   .  COPD (chronic obstructive pulmonary disease)     Past Surgical History   Procedure  Date   .  Abdominal hysterectomy    .  Esophagogastroduodenoscopy  04/18/2012     Procedure: ESOPHAGOGASTRODUODENOSCOPY (EGD); Surgeon: [name], MD; Location: [facility]; Service: Endoscopy; Laterality: N/A;   History reviewed. No pertinent family history.  Social History  History   Substance Use Topics   .  Smoking status:  Former Smoker -- 0.5 packs/day for 35 years     Types:  Cigarettes     Start date:  01/04/2003     Quit date:  04/17/2005   .  Smokeless tobacco:  Never Used   .  Alcohol Use:  No    Allergies   Allergen  Reactions   .   Avelox (Moxifloxacin Hcl In Nacl)  Nausea And Vomiting   .  Alendronate Sodium  Nausea And Vomiting   .  Aspirin  Nausea Only   .  Codeine  Nausea And Vomiting   .  Doxycycline    .  Fluconazole    .  Neurontin (Gabapentin)  Other (See Comments)     Mood changes   .  Sertraline Hcl  Other (See Comments)     Hallucinations   .  Sulfa Antibiotics  Rash    Current Outpatient Prescriptions   Medication  Sig  Dispense  Refill   .  Ascorbic Acid (VITAMIN C PO)  Take 1 tablet by mouth daily.     Marland Kitchen  EPINEPHrine (EPIPEN) 0.3 mg/0.3 mL DEVI  Inject 0.3 mLs (0.3 mg total) into the muscle once.  1 Device  2   .  FLUoxetine (PROZAC) 20 MG capsule  Take 20 mg by mouth daily.     Marland Kitchen  morphine (MSIR) 15 MG tablet  Take 1 tablet (15 mg total) by mouth every 4 (four) hours as needed for pain.  50 tablet  0   .  Multiple Vitamin (MULTIVITAMIN) tablet  Take 1 tablet by mouth daily.     .  ondansetron (ZOFRAN ODT) 4 MG disintegrating tablet  Take 1 tablet (4 mg total) by  mouth every 6 (six) hours as needed for nausea.  30 tablet  0   .  ranitidine (ZANTAC) 150 MG tablet  Take 1 tablet (150 mg total) by mouth 2 (two) times daily.  60 tablet  0   .  triazolam (HALCION) 0.25 MG tablet  Take 0.25 mg by mouth at bedtime as needed. For sleep.     .  hydrocodone-acetaminophen (HYCET) 7.5-325 MG/15ML solution  Take 5 mLs by mouth every 6 (six) hours as needed for pain (or cough).  240 mL  0   Review of Systems  Review of Systems  Constitutional: Negative for fever, chills and unexpected weight change.  HENT: Negative for hearing loss, congestion, sore throat, trouble swallowing and voice change.  Eyes: Negative for visual disturbance.  Respiratory: Negative for cough and wheezing.  Cardiovascular: Negative for chest pain, palpitations and leg swelling.  Gastrointestinal: Positive for nausea and abdominal pain. Negative for vomiting, diarrhea, constipation, blood in stool, abdominal distention and anal bleeding.   Genitourinary: Negative for hematuria, vaginal bleeding and difficulty urinating.  Musculoskeletal: Negative for arthralgias.  Skin: Negative for rash and wound.  Neurological: Negative for seizures, syncope and headaches.  Hematological: Negative for adenopathy. Does not bruise/bleed easily.  Psychiatric/Behavioral: Negative for confusion.  Blood pressure 110/68, pulse 64, temperature 96.4 F (35.8 C), temperature source Temporal, resp. rate 16, height 5\' 4"  (1.626 m), weight 94 lb (42.638 kg).  Physical Exam  Physical Exam  WDWN in NAD  HEENT: EOMI, sclera anicteric  Neck: No masses, no thyromegaly  Lungs: CTA bilaterally; normal respiratory effort  CV: Regular rate and rhythm; no murmurs  Abd: +bowel sounds, soft, palpable 2 cm subcutaneous mobile mass in the epigastrium just above the umbilicus; no overlying skin changes; mild epigastric tenderness. 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 I explained the possible need for conversion to open procedure, as well as the possibility of narrowing of the gastric outlet. The surgical procedure has been discussed with the patient. Potential risks, benefits, alternative treatments, and expected outcomes have been explained. All of the patient's questions at this time have been answered. The likelihood of reaching the patient's treatment goal is good. The patient understand the proposed surgical procedure and wishes to proceed.   [name]. [name], MD, [facility]  06/24/2012 1:51 PM

## 2012-06-26 ENCOUNTER — Encounter (HOSPITAL_COMMUNITY): Payer: Self-pay | Admitting: Pharmacy Technician

## 2012-06-30 ENCOUNTER — Ambulatory Visit (INDEPENDENT_AMBULATORY_CARE_PROVIDER_SITE_OTHER): Payer: Medicare Other | Admitting: Emergency Medicine

## 2012-06-30 ENCOUNTER — Encounter (HOSPITAL_COMMUNITY): Payer: Self-pay

## 2012-06-30 ENCOUNTER — Encounter (HOSPITAL_COMMUNITY)
Admission: RE | Admit: 2012-06-30 | Discharge: 2012-06-30 | Disposition: A | Discharge status: Home / Self Care | Payer: Medicare Other | Source: Ambulatory Visit | Attending: Surgery | Admitting: Surgery

## 2012-06-30 VITALS — BP 126/60 | HR 86 | Temp 98.2°F | Resp 16 | Ht 64.75 in | Wt 96.2 lb

## 2012-06-30 DIAGNOSIS — J209 Acute bronchitis, unspecified: Secondary | ICD-10-CM

## 2012-06-30 HISTORY — DX: Fibromyalgia: M79.7

## 2012-06-30 HISTORY — DX: Pain, unspecified: R52

## 2012-06-30 HISTORY — DX: Bronchitis, not specified as acute or chronic: J40

## 2012-06-30 LAB — BASIC METABOLIC PANEL
BUN: 10 mg/dL (ref 6–23)
Chloride: 102 mEq/L (ref 96–112)
GFR calc Af Amer: 88 mL/min — ABNORMAL LOW (ref >90)
Potassium: 4.2 mEq/L (ref 3.5–5.1)
Sodium: 143 mEq/L (ref 135–145)

## 2012-06-30 LAB — CBC
HCT: 36.8 % (ref 36.0–46.0)
Platelets: 407 10*3/uL — ABNORMAL HIGH (ref 150–400)
RDW: 15.5 % (ref 11.5–15.5)
WBC: 8 10*3/uL (ref 4.0–10.5)

## 2012-06-30 LAB — SURGICAL PCR SCREEN: Staphylococcus aureus: NEGATIVE

## 2012-06-30 MED ORDER — ALBUTEROL SULFATE HFA 108 (90 BASE) MCG/ACT IN AERS: 2.0000 | INHALATION_SPRAY | RESPIRATORY_TRACT | Status: DC | PRN

## 2012-06-30 MED ORDER — CLARITHROMYCIN ER 500 MG PO TB24: 1000.0000 mg | ORAL_TABLET | Freq: Every day | ORAL | Status: DC

## 2012-06-30 NOTE — Patient Instructions (Addendum)

## 2012-06-30 NOTE — Progress Notes (Signed)
[facility] [address] [place] [address] [phone]  Date:  06/30/2012   Name:  YADHIRA MCKNEELY   DOB:  1945/07/29   MRN:  [id number]  PCP:  Tonye Pearson, MD    Chief Complaint: Follow-up   History of Present Illness:  OVIDA DELAGARZA is a 67 y.o. very pleasant female patient who presents with the following:  Seen 2/27 by Dr Perrin Maltese and put on Zpak for bronchitis.  Now returns still coughing.  Has no shortness of breath but mild wheezing.  Non productive cough.  No fever or chills.  No nausea or vomiting.  No stool change.  Pending laparascopic biopsy of stomach wall as well as an excision of an abdominal wall lipoma.  She is concerned as she is still coughing.  CXR done in February was negative.  Patient Active Problem List  Diagnosis  . Depression  . GERD (gastroesophageal reflux disease)  . Osteoarthritis  . Cervical neck pain with evidence of disc disease  . Fibromyalgia  . Community acquired pneumonia  . Back pain  . COPD (chronic obstructive pulmonary disease)  . Pericardial effusion  . Nonspecific (abnormal) findings on radiological and other examination of gastrointestinal tract  . Unspecified gastritis and gastroduodenitis without mention of hemorrhage  . 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 Loss of weight  . Anorexia    Past Medical History  Diagnosis Date  . COPD (chronic obstructive pulmonary disease)   . Pneumonia 12-2011  . GERD (gastroesophageal reflux disease)   . Headache   . Arthritis     osteoarthritis  . Allergy   . Depression   . Neuromuscular disorder   . Osteoporosis   . Bronchitis     CURRENTLY AS OF 06/30/12 - HAS COUGH AND FINISHED ANTIBIOTIC FOR BRONCHITIS  . Fibromyalgia   . Pain     ABDOMINAL PAIN AND NAUSEA  . Pain     SOMETIMES PAIN RIGHT EAR AND NECK--STATES CAUSED BY A "LUMP" ON BACK OF EAR--USES KENALOG CREAM TOPICALLY AS NEEDED.    Past Surgical History  Procedure  Laterality Date  . Abdominal hysterectomy    . Esophagogastroduodenoscopy  04/18/2012    Procedure: ESOPHAGOGASTRODUODENOSCOPY (EGD);  Surgeon: [name], MD;  Location: [facility];  Service: Endoscopy;  Laterality: N/A;  . Eus  05/29/2012    Procedure: UPPER ENDOSCOPIC ULTRASOUND (EUS) LINEAR;  Surgeon: [name], MD;  Location: [facility];  Service: Endoscopy;  Laterality: N/A;  . Appendectomy    . Spine surgery      CERVICAL SPINE SURGERY X 2 - INCLUDING FUSION; LUMBAR SURGERY FOR RUPTURED DISC  . Eye surgery      BILATERAL CATARACT EXTRACTIONS    History  Substance Use Topics  . Smoking status: Former Smoker -- 0.50 packs/day for 35 years    Types: Cigarettes    Start date: 01/04/2003    Quit date: 04/17/2005  . Smokeless tobacco: Never Used  . Alcohol Use: No    Family History  Problem Relation Age of Onset  . COPD Mother   . Hypertension Maternal Grandmother     Allergies  Allergen Reactions  . Avelox (Moxifloxacin Hcl In Nacl) Nausea And Vomiting  . Alendronate Sodium Nausea And Vomiting  . Aspirin Nausea Only  . Codeine Nausea And Vomiting  . Doxycycline   . Fluconazole   . Hydrocodone Nausea And Vomiting    GI distress  . Neurontin (  Gabapentin) Other (See Comments)    Mood changes   . Sertraline Hcl Other (See Comments)    Hallucinations   . Sulfa Antibiotics Rash    Medication list has been reviewed and updated.  Current Outpatient Prescriptions on File Prior to Visit  Medication Sig Dispense Refill  . diphenhydrAMINE (BENADRYL) 25 mg capsule Take 25-50 mg by mouth every 6 (six) hours as needed. Itching      . EPINEPHrine (EPIPEN) 0.3 mg/0.3 mL DEVI Inject 0.3 mLs (0.3 mg total) into the muscle once.  1 Device  2  . FLUoxetine (PROZAC) 20 MG capsule Take 20 mg by mouth daily before breakfast.      . Multiple Vitamin (MULTIVITAMIN) tablet Take 1 tablet by mouth daily.      . ondansetron (ZOFRAN-ODT) 8 MG disintegrating tablet Take 1 tablet  (8 mg total) by mouth every 8 (eight) hours as needed. For nausea  20 tablet  2  . pantoprazole (PROTONIX) 40 MG tablet Take 40 mg by mouth daily.      Marland Kitchen triamcinolone cream (KENALOG) 0.1 % Apply topically 2 (two) times daily.  30 g  0  . azithromycin (ZITHROMAX) 500 MG tablet Take 500 mg by mouth daily.      Marland Kitchen dextromethorphan-guaiFENesin (MUCINEX DM) 30-600 MG per 12 hr tablet Take 1 tablet by mouth every 12 (twelve) hours.       No current facility-administered medications on file prior to visit.    Review of Systems:  As per HPI, otherwise negative.    Physical Examination: Filed Vitals:   06/30/12 1457  BP: 126/60  Pulse: 86  Temp: 98.2 F (36.8 C)  Resp: 16   Filed Vitals:   06/30/12 1457  Height: 5' 4.75" (1.645 m)  Weight: 96 lb 3.2 oz (43.636 kg)   Body mass index is 16.13 kg/(m^2). Ideal Body Weight: Weight in (lb) to have BMI = 25: 148.8  GEN: WDWN, NAD, Non-toxic, A & O x 3 HEENT: Atraumatic, Normocephalic. Neck supple. No masses, No LAD. Ears and Nose: No external deformity. CV: RRR, No M/G/R. No JVD. No thrill. No extra heart sounds. PULM: CTA B, no wheezes, crackles, rhonchi. No retractions. No resp. distress. No accessory muscle use. ABD: S, NT, ND, +BS. No rebound. No HSM. EXTR: No c/c/e NEURO Normal gait.  PSYCH: Normally interactive. Conversant. Not depressed or anxious appearing.  Calm demeanor.    Assessment and Plan: Bronchitis Albuterol MDI biaxin mucinex dm Follow up as needed  Carmelina Dane, MD

## 2012-06-30 NOTE — Pre-Procedure Instructions (Signed)
EKG REPORT IN EPIC FROM 05/05/12. CXR REPORT IN EPIC FROM 06/19/12 - NO ACTIVE DISEASE BUT PT WAS GIVEN Z-PACK  5 DAYS TO TAKE FOR BRONCHITIS AND SHE COMPLETED ON 06/24/12.  PT STILL HAS PRODUCTIVE COUGH - NO FEVER AND STATES SHE PLANS TO GO TO [facility] TODAY FOR FOLLOW UP OF HER BRONCHITIS.  PT INSTRUCTED TO LET THE DOCTOR KNOW SHE IS SCHEDULED FOR SURGERY ON 3/12 AND MAKE SURE HE THINKS SHE IS OK TO PROCEED WITH SURGERY.

## 2012-06-30 NOTE — Patient Instructions (Addendum)
YOUR SURGERY IS SCHEDULED AT [facility]  ON:   Wednesday  3/12  REPORT TO [facility]ORT [facility]AY [facility] AT: 11:00 AM      PHONE # FOR SHORT STAY IS [phone]  DO NOT EAT OR DRINK ANYTHING AFTER MIDNIGHT THE NIGHT BEFORE YOUR SURGERY.  YOU MAY BRUSH YOUR TEETH, RINSE OUT YOUR MOUTH--BUT NO WATER, NO FOOD, NO CHEWING GUM, NO MINTS, NO CANDIES, NO CHEWING TOBACCO.  PLEASE TAKE THE FOLLOWING MEDICATIONS THE AM OF YOUR SURGERY WITH A FEW SIPS OF WATER:  BENADRYL, PROZAC, PROTONIX  IF YOU USE INHALERS--USE YOUR INHALERS AND BRING YOUR INHALER TO HOSPITAL    DO NOT BRING VALUABLES, MONEY, CREDIT CARDS.  DO NOT WEAR JEWELRY, MAKE-UP, NAIL POLISH AND NO METAL PINS OR CLIPS IN YOUR HAIR. CONTACT LENS, DENTURES / PARTIALS, GLASSES SHOULD NOT BE WORN TO SURGERY AND IN MOST CASES-HEARING AIDS WILL NEED TO BE REMOVED.  BRING YOUR GLASSES CASE, ANY EQUIPMENT NEEDED FOR YOUR CONTACT LENS. FOR PATIENTS ADMITTED TO THE HOSPITAL--CHECK OUT TIME THE DAY OF DISCHARGE IS 11:00 AM.  ALL INPATIENT ROOMS ARE PRIVATE - WITH BATHROOM, TELEPHONE, TELEVISION AND WIFI INTERNET.  IF YOU ARE BEING DISCHARGED THE SAME DAY OF YOUR SURGERY--YOU CAN NOT DRIVE YOURSELF HOME--AND SHOULD NOT GO HOME ALONE BY TAXI OR BUS.  NO DRIVING OR OPERATING MACHINERY FOR 24 HOURS FOLLOWING ANESTHESIA / PAIN MEDICATIONS.  PLEASE MAKE ARRANGEMENTS FOR SOMEONE TO BE WITH YOU AT HOME THE FIRST 24 HOURS AFTER SURGERY. RESPONSIBLE DRIVER'S NAME___________________________                                               PHONE #   _______________________                               PLEASE READ OVER ANY  FACT SHEETS THAT YOU WERE GIVEN: MRSA INFORMATION FAILURE TO FOLLOW THESE INSTRUCTIONS MAY RESULT IN THE CANCELLATION OF YOUR SURGERY.    PATIENT SIGNATURE_________________________________

## 2012-07-01 NOTE — Pre-Procedure Instructions (Signed)
DR. Council Mechanic NOTIFIED PT DIAGNOSED WITH BRONCHITIS 06/19/12 AND FINISHED ZPAK--HER PREOP APPOINTMENT WAS YESTERDAY 06/30/12 AND SHE STILL HAD COUGH-NO FEVER AND SAID SHE WAS GOING TO SEE MEDICAL DOCTOR AT [facility] YESTERDAY FOR FOLLOW UP.  OFFICE NOTE FROM THAT VISIT IN EPIC AND SHOWN TO DR. Council Mechanic -PT WAS GIVEN ALBUTEROL AND BIAXIN.  PT IS SCHEDULED FOR GENERAL ANESTHESIA AND SURGERY IS TOMORROW  06/30/12.  DR. Council Mechanic CALLED DR. [name] AND NOTIFIED HIM - PT WILL BE EVALUATED BY ANESTHESIOLOGIST AND DR. [name] DAY OF SURGERY.

## 2012-07-01 NOTE — Progress Notes (Signed)
Notified patient of surgery change time and to arrive in SHORT STAY at 0830 AM. Nothing to eat or drink after midnight except medications as directed by nurse at pre op testing appt. Verbalizes understanding.  Patient states was seen by PCP Dr Dareen Piano- [facility] who gave her an inhaler and another pill and "was OK for surgery"  Instructed to bring medication with her in am.  Notified Lizabeth Leyden RN in short stay of time change and patient notification

## 2012-07-02 ENCOUNTER — Encounter (HOSPITAL_COMMUNITY): Payer: Self-pay | Admitting: Anesthesiology

## 2012-07-02 ENCOUNTER — Encounter (HOSPITAL_COMMUNITY)
Admission: RE | Disposition: A | Discharge status: Home / Self Care | Payer: Self-pay | Source: Ambulatory Visit | Attending: Surgery

## 2012-07-02 ENCOUNTER — Encounter (HOSPITAL_COMMUNITY): Payer: Self-pay | Admitting: *Deleted

## 2012-07-02 ENCOUNTER — Ambulatory Visit (HOSPITAL_COMMUNITY): Payer: Medicare Other | Admitting: Anesthesiology

## 2012-07-02 ENCOUNTER — Inpatient Hospital Stay (HOSPITAL_COMMUNITY)
Admission: RE | Admit: 2012-07-02 | Discharge: 2012-07-04 | DRG: 326 | Disposition: A | Discharge status: Home / Self Care | Payer: Medicare Other | Source: Ambulatory Visit | Attending: Surgery | Admitting: Surgery

## 2012-07-02 DIAGNOSIS — D175 Benign lipomatous neoplasm of intra-abdominal organs: Secondary | ICD-10-CM

## 2012-07-02 DIAGNOSIS — K429 Umbilical hernia without obstruction or gangrene: Secondary | ICD-10-CM

## 2012-07-02 DIAGNOSIS — Z681 Body mass index (BMI) 19 or less, adult: Secondary | ICD-10-CM

## 2012-07-02 DIAGNOSIS — J449 Chronic obstructive pulmonary disease, unspecified: Secondary | ICD-10-CM | POA: Diagnosis present

## 2012-07-02 DIAGNOSIS — J4489 Other specified chronic obstructive pulmonary disease: Secondary | ICD-10-CM | POA: Diagnosis present

## 2012-07-02 DIAGNOSIS — Z87891 Personal history of nicotine dependence: Secondary | ICD-10-CM

## 2012-07-02 DIAGNOSIS — K439 Ventral hernia without obstruction or gangrene: Secondary | ICD-10-CM

## 2012-07-02 DIAGNOSIS — Z79899 Other long term (current) drug therapy: Secondary | ICD-10-CM

## 2012-07-02 DIAGNOSIS — K319 Disease of stomach and duodenum, unspecified: Secondary | ICD-10-CM | POA: Diagnosis present

## 2012-07-02 DIAGNOSIS — E43 Unspecified severe protein-calorie malnutrition: Secondary | ICD-10-CM | POA: Diagnosis present

## 2012-07-02 DIAGNOSIS — Z5331 Laparoscopic surgical procedure converted to open procedure: Secondary | ICD-10-CM

## 2012-07-02 HISTORY — PX: LAPAROSCOPIC ABDOMINAL EXPLORATION: SHX6249

## 2012-07-02 SURGERY — EXPLORATION, ABDOMEN, LAPAROSCOPIC
Anesthesia: General | Site: Abdomen | Wound class: Clean Contaminated

## 2012-07-02 SURGERY — EXPLORATION, ABDOMEN, LAPAROSCOPIC
Anesthesia: General

## 2012-07-02 MED ORDER — ENOXAPARIN SODIUM 40 MG/0.4ML ~~LOC~~ SOLN
40.0000 mg | SUBCUTANEOUS | Status: DC
  Administered 2012-07-03 – 2012-07-04 (×2): 40 mg via SUBCUTANEOUS
  Filled 2012-07-02 (×3): qty 0.4

## 2012-07-02 MED ORDER — SUCCINYLCHOLINE CHLORIDE 20 MG/ML IJ SOLN
INTRAMUSCULAR | Status: DC | PRN
  Administered 2012-07-02: 80 mg via INTRAVENOUS

## 2012-07-02 MED ORDER — FENTANYL CITRATE 0.05 MG/ML IJ SOLN
25.0000 ug | INTRAMUSCULAR | Status: DC | PRN
  Administered 2012-07-02 (×2): 50 ug via INTRAVENOUS
  Administered 2012-07-02 (×2): 25 ug via INTRAVENOUS

## 2012-07-02 MED ORDER — DEXTROSE 5 % IV SOLN
1.0000 g | Freq: Four times a day (QID) | INTRAVENOUS | Status: AC
  Administered 2012-07-02 – 2012-07-03 (×3): 1 g via INTRAVENOUS
  Filled 2012-07-02 (×3): qty 1

## 2012-07-02 MED ORDER — BUPIVACAINE HCL (PF) 0.25 % IJ SOLN
INTRAMUSCULAR | Status: AC
  Filled 2012-07-02: qty 30

## 2012-07-02 MED ORDER — CEFOXITIN SODIUM-DEXTROSE 1-4 GM-% IV SOLR (PREMIX)
INTRAVENOUS | Status: AC
  Filled 2012-07-02: qty 100

## 2012-07-02 MED ORDER — CHLORHEXIDINE GLUCONATE 4 % EX LIQD
1.0000 | Freq: Once | CUTANEOUS | Status: DC
Start: 2012-07-02 — End: 2012-07-02

## 2012-07-02 MED ORDER — FLUOXETINE HCL 20 MG PO CAPS
20.0000 mg | ORAL_CAPSULE | Freq: Every day | ORAL | Status: DC
  Administered 2012-07-03 – 2012-07-04 (×2): 20 mg via ORAL
  Filled 2012-07-02 (×2): qty 1

## 2012-07-02 MED ORDER — PROPOFOL 10 MG/ML IV BOLUS
INTRAVENOUS | Status: DC | PRN
  Administered 2012-07-02: 120 mg via INTRAVENOUS

## 2012-07-02 MED ORDER — CLARITHROMYCIN 500 MG PO TABS
1000.0000 mg | ORAL_TABLET | Freq: Every day | ORAL | Status: DC
  Administered 2012-07-02 – 2012-07-04 (×3): 1000 mg via ORAL
  Filled 2012-07-02 (×3): qty 2

## 2012-07-02 MED ORDER — LACTATED RINGERS IV SOLN: INTRAVENOUS | Status: DC

## 2012-07-02 MED ORDER — HYDROMORPHONE HCL PF 1 MG/ML IJ SOLN
1.0000 mg | INTRAMUSCULAR | Status: DC | PRN
  Administered 2012-07-02 – 2012-07-03 (×3): 1 mg via INTRAVENOUS
  Filled 2012-07-02 (×3): qty 1

## 2012-07-02 MED ORDER — DEXAMETHASONE SODIUM PHOSPHATE 10 MG/ML IJ SOLN
INTRAMUSCULAR | Status: DC | PRN
  Administered 2012-07-02: 10 mg via INTRAVENOUS

## 2012-07-02 MED ORDER — ONDANSETRON HCL 4 MG PO TABS: 4.0000 mg | ORAL_TABLET | Freq: Four times a day (QID) | ORAL | Status: DC | PRN

## 2012-07-02 MED ORDER — PANTOPRAZOLE SODIUM 40 MG PO TBEC
40.0000 mg | DELAYED_RELEASE_TABLET | Freq: Every day | ORAL | Status: DC
  Administered 2012-07-03 – 2012-07-04 (×2): 40 mg via ORAL
  Filled 2012-07-02 (×2): qty 1

## 2012-07-02 MED ORDER — DIPHENHYDRAMINE HCL 50 MG/ML IJ SOLN: 25.0000 mg | Freq: Four times a day (QID) | INTRAMUSCULAR | Status: DC | PRN

## 2012-07-02 MED ORDER — ONDANSETRON HCL 4 MG/2ML IJ SOLN
INTRAMUSCULAR | Status: DC | PRN
  Administered 2012-07-02: 4 mg via INTRAVENOUS

## 2012-07-02 MED ORDER — POTASSIUM CHLORIDE IN NACL 20-0.9 MEQ/L-% IV SOLN
INTRAVENOUS | Status: DC
  Administered 2012-07-02 – 2012-07-03 (×2): via INTRAVENOUS
  Administered 2012-07-04: 50 mL/h via INTRAVENOUS
  Filled 2012-07-02 (×4): qty 1000

## 2012-07-02 MED ORDER — LACTATED RINGERS IV SOLN
INTRAVENOUS | Status: DC | PRN
  Administered 2012-07-02: 10:00:00 via INTRAVENOUS

## 2012-07-02 MED ORDER — FENTANYL CITRATE 0.05 MG/ML IJ SOLN
INTRAMUSCULAR | Status: AC
  Filled 2012-07-02: qty 2

## 2012-07-02 MED ORDER — DM-GUAIFENESIN ER 30-600 MG PO TB12
1.0000 | ORAL_TABLET | Freq: Two times a day (BID) | ORAL | Status: DC
  Administered 2012-07-02 – 2012-07-04 (×4): 1 via ORAL
  Filled 2012-07-02 (×5): qty 1

## 2012-07-02 MED ORDER — FENTANYL CITRATE 0.05 MG/ML IJ SOLN
INTRAMUSCULAR | Status: DC | PRN
  Administered 2012-07-02: 25 ug via INTRAVENOUS
  Administered 2012-07-02: 50 ug via INTRAVENOUS
  Administered 2012-07-02: 25 ug via INTRAVENOUS
  Administered 2012-07-02: 100 ug via INTRAVENOUS
  Administered 2012-07-02 (×2): 25 ug via INTRAVENOUS

## 2012-07-02 MED ORDER — BUPIVACAINE-EPINEPHRINE 0.25% -1:200000 IJ SOLN
INTRAMUSCULAR | Status: DC | PRN
  Administered 2012-07-02: 20 mL

## 2012-07-02 MED ORDER — PROMETHAZINE HCL 25 MG/ML IJ SOLN: 6.2500 mg | INTRAMUSCULAR | Status: DC | PRN

## 2012-07-02 MED ORDER — CISATRACURIUM BESYLATE (PF) 10 MG/5ML IV SOLN
INTRAVENOUS | Status: DC | PRN
  Administered 2012-07-02: 2 mg via INTRAVENOUS
  Administered 2012-07-02: 4 mg via INTRAVENOUS

## 2012-07-02 MED ORDER — KETOROLAC TROMETHAMINE 30 MG/ML IJ SOLN
15.0000 mg | Freq: Once | INTRAMUSCULAR | Status: AC | PRN
  Administered 2012-07-02: 30 mg via INTRAVENOUS

## 2012-07-02 MED ORDER — ALBUTEROL SULFATE HFA 108 (90 BASE) MCG/ACT IN AERS
2.0000 | INHALATION_SPRAY | RESPIRATORY_TRACT | Status: DC | PRN
  Filled 2012-07-02: qty 6.7

## 2012-07-02 MED ORDER — GLYCOPYRROLATE 0.2 MG/ML IJ SOLN
INTRAMUSCULAR | Status: DC | PRN
  Administered 2012-07-02: .6 mg via INTRAVENOUS

## 2012-07-02 MED ORDER — CLARITHROMYCIN ER 500 MG PO TB24: 1000.0000 mg | ORAL_TABLET | Freq: Every day | ORAL | Status: DC

## 2012-07-02 MED ORDER — ALBUTEROL SULFATE HFA 108 (90 BASE) MCG/ACT IN AERS
INHALATION_SPRAY | RESPIRATORY_TRACT | Status: DC | PRN
  Administered 2012-07-02: 2 via RESPIRATORY_TRACT

## 2012-07-02 MED ORDER — NEOSTIGMINE METHYLSULFATE 1 MG/ML IJ SOLN
INTRAMUSCULAR | Status: DC | PRN
  Administered 2012-07-02: 4 mg via INTRAVENOUS

## 2012-07-02 MED ORDER — DEXTROSE 5 % IV SOLN
2.0000 g | INTRAVENOUS | Status: AC
  Administered 2012-07-02: 2 g via INTRAVENOUS
  Filled 2012-07-02: qty 2

## 2012-07-02 MED ORDER — KETOROLAC TROMETHAMINE 30 MG/ML IJ SOLN
INTRAMUSCULAR | Status: AC
  Filled 2012-07-02: qty 1

## 2012-07-02 MED ORDER — ONDANSETRON HCL 4 MG/2ML IJ SOLN: 4.0000 mg | Freq: Four times a day (QID) | INTRAMUSCULAR | Status: DC | PRN

## 2012-07-02 SURGICAL SUPPLY — 46 items
BENZOIN TINCTURE PRP APPL 2/3 (GAUZE/BANDAGES/DRESSINGS) ×4 IMPLANT
CANISTER SUCTION 2500CC (MISCELLANEOUS) ×2 IMPLANT
CHLORAPREP W/TINT 26ML (MISCELLANEOUS) ×4 IMPLANT
CLOTH BEACON ORANGE TIMEOUT ST (SAFETY) ×2 IMPLANT
CLSR STERI-STRIP ANTIMIC 1/2X4 (GAUZE/BANDAGES/DRESSINGS) ×2 IMPLANT
COVER SURGICAL LIGHT HANDLE (MISCELLANEOUS) IMPLANT
DECANTER SPIKE VIAL GLASS SM (MISCELLANEOUS) ×2 IMPLANT
DRAPE LAPAROSCOPIC ABDOMINAL (DRAPES) ×2 IMPLANT
DRAPE UTILITY 15X26 (DRAPE) ×2 IMPLANT
DRAPE UTILITY W/TAPE 26X15 (DRAPES) ×2 IMPLANT
DRSG TEGADERM 2-3/8X2-3/4 SM (GAUZE/BANDAGES/DRESSINGS) ×2 IMPLANT
DRSG TEGADERM 4X4.75 (GAUZE/BANDAGES/DRESSINGS) ×4 IMPLANT
ELECT REM PT RETURN 9FT ADLT (ELECTROSURGICAL) ×2
ELECTRODE REM PT RTRN 9FT ADLT (ELECTROSURGICAL) ×1 IMPLANT
FILTER SMOKE EVAC LAPAROSHD (FILTER) IMPLANT
GLOVE BIOGEL PI IND STRL 7.0 (GLOVE) ×2 IMPLANT
GLOVE BIOGEL PI INDICATOR 7.0 (GLOVE) ×2
GOWN SRG XL XLNG 56XLVL 4 (GOWN DISPOSABLE) ×2 IMPLANT
GOWN STRL NON-REIN LRG LVL3 (GOWN DISPOSABLE) ×2 IMPLANT
GOWN STRL NON-REIN XL XLG LVL4 (GOWN DISPOSABLE) ×2
GOWN STRL REIN XL XLG (GOWN DISPOSABLE) ×4 IMPLANT
HAND ACTIVATED (MISCELLANEOUS) IMPLANT
KIT BASIN OR (CUSTOM PROCEDURE TRAY) ×2 IMPLANT
PENCIL BUTTON HOLSTER BLD 10FT (ELECTRODE) ×2 IMPLANT
SCISSORS LAP 5X35 DISP (ENDOMECHANICALS) ×2 IMPLANT
SET IRRIG TUBING LAPAROSCOPIC (IRRIGATION / IRRIGATOR) ×2 IMPLANT
SOLUTION ANTI FOG 6CC (MISCELLANEOUS) ×2 IMPLANT
SPONGE GAUZE 4X4 12PLY (GAUZE/BANDAGES/DRESSINGS) ×2 IMPLANT
SPONGE LAP 18X18 X RAY DECT (DISPOSABLE) ×2 IMPLANT
STRIP CLOSURE SKIN 1/2X4 (GAUZE/BANDAGES/DRESSINGS) ×2 IMPLANT
SUT NOVA NAB DX-16 0-1 5-0 T12 (SUTURE) ×2 IMPLANT
SUT SILK 2 0 SH CR/8 (SUTURE) ×4 IMPLANT
SUT SILK 3 0 SH CR/8 (SUTURE) ×2 IMPLANT
SUT VIC AB 3-0 SH 27 (SUTURE) ×1
SUT VIC AB 3-0 SH 27XBRD (SUTURE) ×1 IMPLANT
SUT VIC AB 4-0 SH 18 (SUTURE) IMPLANT
SYR 30ML LL (SYRINGE) ×2 IMPLANT
TOWEL NATURAL 10PK STERILE (DISPOSABLE) ×2 IMPLANT
TOWEL OR 17X26 10 PK STRL BLUE (TOWEL DISPOSABLE) ×2 IMPLANT
TRAY FOLEY CATH 14FRSI W/METER (CATHETERS) IMPLANT
TRAY LAP CHOLE (CUSTOM PROCEDURE TRAY) ×2 IMPLANT
TROCAR BLADELESS OPT 5 100 (ENDOMECHANICALS) ×2 IMPLANT
TROCAR XCEL BLUNT TIP 100MML (ENDOMECHANICALS) IMPLANT
TROCAR XCEL NON-BLD 5MMX100MML (ENDOMECHANICALS) ×2 IMPLANT
TUBING INSUFFLATION 10FT LAP (TUBING) ×2 IMPLANT
YANKAUER SUCT BULB TIP NO VENT (SUCTIONS) ×2 IMPLANT

## 2012-07-02 NOTE — Op Note (Signed)
Preop diagnosis: #1 epigastric mass probable lipoma #2 gastric wall thickening with no established diagnosis Postop diagnosis: #1 supraumbilical hernia #2 gastric wall thickening with no established diagnosis Procedure: #1 open repair of supraumbilical hernia #2 gastric wall biopsy Surgeon:Lakira Ogando K. Assistant: Dr. Chevis Pretty Anesthesia: Gen. Endotracheal Indications: This is a 67 year old female who presents with epigastric pain, a palpable mass, and poor appetite. A CT scan showed some thickening of the distal stomach. She has undergone GI workup with endoscopy. Mucosal biopsies were unremarkable. She is referred for possible resection of the palpable mass in her epigastrium as well as a gastric wall biopsy for diagnosis.  Description of procedure: The patient is brought to the operating room and placed in a supine position on the operating room table. After an adequate level of general anesthesia was obtained the patient's abdomen was prepped with chlor prep and draped in sterile fashion. A timeout was taken to ensure the proper patient proper procedure. We made a 2 cm vertical incision over the palpable mass above the umbilicus. Dissection was carried down into the subcutaneous tissues with cautery. We bluntly dissect around the mass. This turns out to be a small umbilical hernia through a very tiny defect. We reduced the contents of the hernia sac back into the preperitoneal space. The defect is only about 5 mm across. We open the defect slightly and inserted a finger. We bluntly dissected and the peritoneal space. A stay suture of 0 Vicryl was placed around the fascial opening. The Hassan cannula was inserted and secured with the stay suture. Pneumoperitoneum was obtained insufflating CO2 maintaining a maximum pressure of 15 mm of mercury. The laparoscope was inserted. A 5 mm port was placed in the left lower quadrant. We moved a scope done of this port and examined the upper abdomen. The  stomach appears mildly thickened in the antrum leading to the pylorus. Our concern was that if we did a laparoscopic wedge resection with staplers that we would narrow the gastric outlet. The antrum is directly below our umbilical port site. We made the decision to convert to an open procedure to allow for a gastric wall biopsy without narrowing the gastric outlet. We released our insufflation. I extended our midline incision to about 4 cm. We extended the fascial opening slightly. The antrum was directly below incision. We grasped the stomach with a Babcock clamp and pulled up the wound. A full-thickness 1 cm square biopsy of stomach wall was taken and sent for pathologic examination. We also excised the hernia sac between Kelly clamps and ligated with 2-0 silk sutures. We closed our gastrotomy with an inner layer of full-thickness 3-0 silk sutures and outer layer of 2-0 silk sutures. Hemostasis was good. We irrigated the abdomen and then closed the fascia with #1 Novafil sutures. The subcutaneous tissues were irrigated. The subcutaneous tissues were closed with 3-0 Vicryl. 4-0 Monocryl was used to close both skin incisions. Steri-Strips and clean dressings were applied. The patient was then extubated and brought to the recovery room in stable condition. All sponge, initially, and needle counts are correct.  [name]. [name], MD, [facility]  07/02/2012 11:50 AM

## 2012-07-02 NOTE — Anesthesia Preprocedure Evaluation (Addendum)
Anesthesia Evaluation  Patient identified by MRN, date of birth, ID band Patient awake    Reviewed: Allergy & Precautions, H&P , NPO status , Patient's Chart, lab work & pertinent test results  Airway Mallampati: II TM Distance: <3 FB Neck ROM: Full    Dental  (+) Missing and Dental Advisory Given   Pulmonary COPD COPD inhaler,   Patient smells like smoke  + decreased breath sounds      Cardiovascular negative cardio ROS  Rhythm:Regular Rate:Normal     Neuro/Psych negative neurological ROS  negative psych ROS   GI/Hepatic Neg liver ROS, GERD-  Medicated,  Endo/Other  negative endocrine ROS  Renal/GU negative Renal ROS  negative genitourinary   Musculoskeletal negative musculoskeletal ROS (+)   Abdominal   Peds negative pediatric ROS (+)  Hematology negative hematology ROS (+)   Anesthesia Other Findings   Reproductive/Obstetrics negative OB ROS                          Anesthesia Physical Anesthesia Plan  ASA: III  Anesthesia Plan: General   Post-op Pain Management:    Induction: Intravenous  Airway Management Planned: Oral ETT  Additional Equipment:   Intra-op Plan:   Post-operative Plan: Extubation in OR and Possible Post-op intubation/ventilation  Informed Consent: I have reviewed the patients History and Physical, chart, labs and discussed the procedure including the risks, benefits and alternatives for the proposed anesthesia with the patient or authorized representative who has indicated his/her understanding and acceptance.   Dental advisory given  Plan Discussed with: CRNA and Surgeon  Anesthesia Plan Comments:        Anesthesia Quick Evaluation

## 2012-07-02 NOTE — Interval H&P Note (Signed)
History and Physical Interval Note:  07/02/2012 9:10 AM  Cynthia Bowen  has presented today for surgery, with the diagnosis of gastric wall thickening epigastric subcutaneous lipoma 2 cm  The various methods of treatment have been discussed with the patient and family. After consideration of risks, benefits and other options for treatment, the patient has consented to  Procedure(s) with comments: LAPAROSCOPIC GASTRIC BIOPSY REMOVAL OF EPIGASTRIC LIPOMA   (N/A) - Laparoscopic Gastric Biospy Removal of Epigastric Lipoma  as a surgical intervention .  The patient's history has been reviewed, patient examined, no change in status, stable for surgery.  I have reviewed the patient's chart and labs.  Questions were answered to the patient's satisfaction.     Khushbu Pippen K.

## 2012-07-02 NOTE — Transfer of Care (Signed)
Immediate Anesthesia Transfer of Care Note  Patient: Cynthia Bowen  Procedure(s) Performed: Procedure(s) with comments: converted to laparotomy with gastric biopsy (N/A) - Laparoscopic Gastric Biospy attempted. converted to exploratory laparotomy with gastric wedge resection , open repair of umbilical hernia  Patient Location: PACU  Anesthesia Type:General  Level of Consciousness: awake, alert  and patient cooperative  Airway & Oxygen Therapy: Patient Spontanous Breathing and Patient connected to face mask oxygen  Post-op Assessment: Report given to PACU RN and Post -op Vital signs reviewed and stable  Post vital signs: Reviewed and stable  Complications: No apparent anesthesia complications

## 2012-07-02 NOTE — Preoperative (Signed)
Beta Blockers   Reason not to administer Beta Blockers:Not Applicable 

## 2012-07-02 NOTE — Anesthesia Postprocedure Evaluation (Signed)
  Anesthesia Post-op Note  Patient: Cynthia Bowen  Procedure(s) Performed: Procedure(s) (LRB): converted to laparotomy with gastric biopsy (N/A)  Patient Location: PACU  Anesthesia Type: General  Level of Consciousness: awake and alert   Airway and Oxygen Therapy: Patient Spontanous Breathing  Post-op Pain: mild  Post-op Assessment: Post-op Vital signs reviewed, Patient's Cardiovascular Status Stable, Respiratory Function Stable, Patent Airway and No signs of Nausea or vomiting  Last Vitals:  Filed Vitals:   07/02/12 1200  BP: 164/74  Pulse: 79  Temp:   Resp: 19    Post-op Vital Signs: stable   Complications: No apparent anesthesia complications

## 2012-07-02 NOTE — H&P (View-Only) (Signed)
Patient ID: Cynthia Bowen, female   DOB: 08/22/1945, 66 y.o.   MRN: [id number] HPI  Cynthia Bowen is a 66 y.o. female. Referred by Dr. [name] for evaluation of abdominal wall mass GI:  Dr. Kaplan/ [facility] for evaluation of gastric wall thickening HPI  This is a 66-year-old female who initially palpated a mass in her epigastrium about 6 weeks ago. This occasionally becomes tender. She had a another mass on her ear and one on her clavicle. The one on the clavicle has resolved. The one on the ear tends to fluctuate in size. After the patient was evaluated in early December and was referred to our office, she developed severe nausea and epigastric discomfort. She was hospitalized and a CT scan showed thickening of the distal stomach and antrum. She is undergoing workup by Dr. Kaplan of GI and has an appointment in a few weeks to get set up for an endoscopic ultrasound of this area to rule out malignancy. Mucosal biopsies were negative. She continues to have some nausea but is able to tolerate a diet. She states that the mass in her epigastrium has not really changed in size.   She underwent endoscopic ultrasound and FNA which were unremarkable and showed only non-specific gastric wall thickening. FNA unremarkable.  Past Medical History   Diagnosis  Date   .  COPD (chronic obstructive pulmonary disease)     Past Surgical History   Procedure  Date   .  Abdominal hysterectomy    .  Esophagogastroduodenoscopy  04/18/2012     Procedure: ESOPHAGOGASTRODUODENOSCOPY (EGD); Surgeon: Robert D Kaplan, MD; Location: [facility]; Service: Endoscopy; Laterality: N/A;   History reviewed. No pertinent family history.  Social History  History   Substance Use Topics   .  Smoking status:  Former Smoker -- 0.5 packs/day for 35 years     Types:  Cigarettes     Start date:  01/04/2003     Quit date:  04/17/2005   .  Smokeless tobacco:  Never Used   .  Alcohol Use:  No    Allergies   Allergen  Reactions   .   Avelox (Moxifloxacin Hcl In Nacl)  Nausea And Vomiting   .  Alendronate Sodium  Nausea And Vomiting   .  Aspirin  Nausea Only   .  Codeine  Nausea And Vomiting   .  Doxycycline    .  Fluconazole    .  Neurontin (Gabapentin)  Other (See Comments)     Mood changes   .  Sertraline Hcl  Other (See Comments)     Hallucinations   .  Sulfa Antibiotics  Rash    Current Outpatient Prescriptions   Medication  Sig  Dispense  Refill   .  Ascorbic Acid (VITAMIN C PO)  Take 1 tablet by mouth daily.     .  EPINEPHrine (EPIPEN) 0.3 mg/0.3 mL DEVI  Inject 0.3 mLs (0.3 mg total) into the muscle once.  1 Device  2   .  FLUoxetine (PROZAC) 20 MG capsule  Take 20 mg by mouth daily.     .  morphine (MSIR) 15 MG tablet  Take 1 tablet (15 mg total) by mouth every 4 (four) hours as needed for pain.  50 tablet  0   .  Multiple Vitamin (MULTIVITAMIN) tablet  Take 1 tablet by mouth daily.     .  ondansetron (ZOFRAN ODT) 4 MG disintegrating tablet  Take 1 tablet (4 mg total) by   mouth every 6 (six) hours as needed for nausea.  30 tablet  0   .  ranitidine (ZANTAC) 150 MG tablet  Take 1 tablet (150 mg total) by mouth 2 (two) times daily.  60 tablet  0   .  triazolam (HALCION) 0.25 MG tablet  Take 0.25 mg by mouth at bedtime as needed. For sleep.     .  hydrocodone-acetaminophen (HYCET) 7.5-325 MG/15ML solution  Take 5 mLs by mouth every 6 (six) hours as needed for pain (or cough).  240 mL  0   Review of Systems  Review of Systems  Constitutional: Negative for fever, chills and unexpected weight change.  HENT: Negative for hearing loss, congestion, sore throat, trouble swallowing and voice change.  Eyes: Negative for visual disturbance.  Respiratory: Negative for cough and wheezing.  Cardiovascular: Negative for chest pain, palpitations and leg swelling.  Gastrointestinal: Positive for nausea and abdominal pain. Negative for vomiting, diarrhea, constipation, blood in stool, abdominal distention and anal bleeding.   Genitourinary: Negative for hematuria, vaginal bleeding and difficulty urinating.  Musculoskeletal: Negative for arthralgias.  Skin: Negative for rash and wound.  Neurological: Negative for seizures, syncope and headaches.  Hematological: Negative for adenopathy. Does not bruise/bleed easily.  Psychiatric/Behavioral: Negative for confusion.  Blood pressure 110/68, pulse 64, temperature 96.4 F (35.8 C), temperature source Temporal, resp. rate 16, height 5' 4" (1.626 m), weight 94 lb (42.638 kg).  Physical Exam  Physical Exam  WDWN in NAD  HEENT: EOMI, sclera anicteric  Neck: No masses, no thyromegaly  Lungs: CTA bilaterally; normal respiratory effort  CV: Regular rate and rhythm; no murmurs  Abd: +bowel sounds, soft, palpable 2 cm subcutaneous mobile mass in the epigastrium just above the umbilicus; no overlying skin changes; mild epigastric tenderness. 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 I explained the possible need for conversion to open procedure, as well as the possibility of narrowing of the gastric outlet. The surgical procedure has been discussed with the patient. Potential risks, benefits, alternative treatments, and expected outcomes have been explained. All of the patient's questions at this time have been answered. The likelihood of reaching the patient's treatment goal is good. The patient understand the proposed surgical procedure and wishes to proceed.   Matthew K. Tsuei, MD, FACS [facility]  06/24/2012 1:51 PM   

## 2012-07-03 ENCOUNTER — Encounter (HOSPITAL_COMMUNITY): Payer: Self-pay | Admitting: Surgery

## 2012-07-03 LAB — CBC
Hemoglobin: 11.1 g/dL — ABNORMAL LOW (ref 12.0–15.0)
MCHC: 33.2 g/dL (ref 30.0–36.0)
RBC: 3.89 MIL/uL (ref 3.87–5.11)
WBC: 10.3 10*3/uL (ref 4.0–10.5)

## 2012-07-03 LAB — BASIC METABOLIC PANEL
CO2: 27 mEq/L (ref 19–32)
Chloride: 101 mEq/L (ref 96–112)
GFR calc non Af Amer: 89 mL/min — ABNORMAL LOW (ref >90)
Glucose, Bld: 124 mg/dL — ABNORMAL HIGH (ref 70–99)
Potassium: 4.4 mEq/L (ref 3.5–5.1)
Sodium: 136 mEq/L (ref 135–145)

## 2012-07-03 MED ORDER — BOOST PLUS PO LIQD
237.0000 mL | Freq: Three times a day (TID) | ORAL | Status: DC
  Administered 2012-07-03 – 2012-07-04 (×3): 237 mL via ORAL
  Filled 2012-07-03 (×5): qty 237

## 2012-07-03 MED ORDER — ADULT MULTIVITAMIN W/MINERALS CH
1.0000 | ORAL_TABLET | Freq: Every day | ORAL | Status: DC
  Administered 2012-07-03 – 2012-07-04 (×2): 1 via ORAL
  Filled 2012-07-03 (×2): qty 1

## 2012-07-03 MED ORDER — KETOROLAC TROMETHAMINE 30 MG/ML IJ SOLN
30.0000 mg | Freq: Four times a day (QID) | INTRAMUSCULAR | Status: DC
  Administered 2012-07-03 – 2012-07-04 (×5): 30 mg via INTRAVENOUS
  Filled 2012-07-03 (×8): qty 1

## 2012-07-03 NOTE — Progress Notes (Signed)
INITIAL NUTRITION ASSESSMENT  Pt meets criteria for severe MALNUTRITION in the context of chronic illness as evidenced by 16.5% weight loss in the past 6 months in addition to pt with visible severe muscle wasting and subcutaneous fat loss in patellar region, anterior thigh region, and posterior calf region.  DOCUMENTATION CODES Per approved criteria  -Severe malnutrition in the context of chronic illness -Underweight   INTERVENTION: - Boost Plus TID - Encouraged high calorie/protein diet - Anti-emetics per MD - Multivitamin 1 tablet PO daily - Will continue to monitor  NUTRITION DIAGNOSIS: Altered GI function related to chronic nausea as evidenced by pt statement.   Goal: 1. Resolution of nausea 2. Pt to consume >90% of meals/supplements  Monitor:  Weights, labs, intake, nausea  Reason for Assessment: Nutrition risk, underweight  67 y.o. female  Admitting Dx: Evaluation of abdominal wall mass  ASSESSMENT: Pt presented with mass in epigastrium that started 6 weeks ago. POD# 1 laparotomy with gastric biopsy. Pt reports she eats 2 well balanced meals/day and drinks 1 Boost/day. Pt reports having nausea for the past 1.5 months. Pt reports 19 pound unintended weight loss in the past 6 months. Pt reports she ate a good breakfast, some soup, orange juice, milk, and yogurt. Pt visibly malnourished. Performed nutrition focused physical exam.   Nutrition Focused Physical Exam:  Subcutaneous Fat:  Orbital Region: mild/moderate wasting Upper Arm Region: mild/moderate wasting Thoracic and Lumbar Region: N/A  Muscle:  Temple Region: mild/moderate wasting Clavicle Bone Region: mild/moderate wasting Clavicle and Acromion Bone Region: mild/moderate wasting Scapular Bone Region: N/A Dorsal Hand: mild/moderate wasting Patellar Region: severe wasting Anterior Thigh Region: severe wasting Posterior Calf Region: severe wasting  Edema: None observed   Height: Ht Readings from Last 1  Encounters:  07/02/12 5\' 5"  (1.651 m)    Weight: Wt Readings from Last 1 Encounters:  07/02/12 96 lb 5 oz (43.687 kg)    Ideal Body Weight: 125 lb  % Ideal Body Weight: 77  Wt Readings from Last 10 Encounters:  07/02/12 96 lb 5 oz (43.687 kg)  07/02/12 96 lb 5 oz (43.687 kg)  07/02/12 96 lb 5 oz (43.687 kg)  06/30/12 96 lb 3.2 oz (43.636 kg)  06/30/12 98 lb 12.8 oz (44.815 kg)  06/24/12 98 lb 3.2 oz (44.543 kg)  06/19/12 96 lb 6.4 oz (43.727 kg)  06/11/12 94 lb (42.638 kg)  05/29/12 95 lb 8 oz (43.319 kg)  05/29/12 95 lb 8 oz (43.319 kg)    Usual Body Weight: 115 lb  % Usual Body Weight: 83  BMI:  Body mass index is 16.03 kg/(m^2).  Estimated Nutritional Needs: Kcal: 1400-1750 Protein: 65-85g Fluid: 1.4-1.7L/day  Skin: Abdomen incision  Diet Order: Full Liquid  EDUCATION NEEDS: -No education needs identified at this time   Intake/Output Summary (Last 24 hours) at 07/03/12 1127 Last data filed at 07/03/12 1059  Gross per 24 hour  Intake 2443.33 ml  Output   2900 ml  Net -456.67 ml    Last BM: 3/11  Labs:   Recent Labs Lab 06/30/12 1345 07/03/12 0400  NA 143 136  K 4.2 4.4  CL 102 101  CO2 29 27  BUN 10 11  CREATININE 0.80 0.71  CALCIUM 10.0 9.0  GLUCOSE 75 124*    CBG (last 3)  No results found for this basename: GLUCAP,  in the last 72 hours  Scheduled Meds: . clarithromycin  1,000 mg Oral Daily  . dextromethorphan-guaiFENesin  1 tablet Oral BID  .  enoxaparin (LOVENOX) injection  40 mg Subcutaneous Q24H  . FLUoxetine  20 mg Oral Daily  . ketorolac  30 mg Intravenous Q6H  . pantoprazole  40 mg Oral Daily    Continuous Infusions: . 0.9 % NaCl with KCl 20 mEq / L 50 mL/hr at 07/03/12 0756  . lactated ringers      Past Medical History  Diagnosis Date  . COPD (chronic obstructive pulmonary disease)   . Pneumonia 12-2011  . GERD (gastroesophageal reflux disease)   . Headache   . Arthritis     osteoarthritis  . Allergy   .  Depression   . Neuromuscular disorder   . Osteoporosis   . Bronchitis     CURRENTLY AS OF 06/30/12 - HAS COUGH AND FINISHED ANTIBIOTIC FOR BRONCHITIS  . Fibromyalgia   . Pain     ABDOMINAL PAIN AND NAUSEA  . Pain     SOMETIMES PAIN RIGHT EAR AND NECK--STATES CAUSED BY A "LUMP" ON BACK OF EAR--USES KENALOG CREAM TOPICALLY AS NEEDED.    Past Surgical History  Procedure Laterality Date  . Abdominal hysterectomy    . Esophagogastroduodenoscopy  04/18/2012    Procedure: ESOPHAGOGASTRODUODENOSCOPY (EGD);  Surgeon: [name], MD;  Location: [facility];  Service: Endoscopy;  Laterality: N/A;  . Eus  05/29/2012    Procedure: UPPER ENDOSCOPIC ULTRASOUND (EUS) LINEAR;  Surgeon: [name], MD;  Location: [facility];  Service: Endoscopy;  Laterality: N/A;  . Appendectomy    . Spine surgery      CERVICAL SPINE SURGERY X 2 - INCLUDING FUSION; LUMBAR SURGERY FOR RUPTURED DISC  . Eye surgery      BILATERAL CATARACT EXTRACTIONS     Levon Hedger MS, RD, LDN [phone] Pager [phone] After Hours Pager

## 2012-07-03 NOTE — Progress Notes (Signed)
1 Day Post-Op  Subjective: Patient is quite sore, especially when coughing. No nausea or vomiting  Objective: Vital signs in last 24 hours: Temp:  [97.5 F (36.4 C)-98.7 F (37.1 C)] 98.2 F (36.8 C) (03/13 0552) Pulse Rate:  [65-97] 76 (03/13 0552) Resp:  [13-21] 18 (03/13 0552) BP: (100-165)/(61-98) 135/80 mmHg (03/13 0552) SpO2:  [96 %-100 %] 96 % (03/13 0552) Weight:  [96 lb 5 oz (43.687 kg)] 96 lb 5 oz (43.687 kg) (03/12 1511) Last BM Date: 07/01/12  Intake/Output from previous day: 03/12 0701 - 03/13 0700 In: 2875 [P.O.:1030; I.V.:1795; IV Piggyback:50] Out: 2220 [Urine:2200; Blood:20] Intake/Output this shift:    General appearance: alert, cooperative and no distress GI: soft, non-tender; bowel sounds normal; no masses,  no organomegaly Dressings dry  Lab Results:   Recent Labs  06/30/12 1340 07/03/12 0400  WBC 8.0 10.3  HGB 12.5 11.1*  HCT 36.8 33.4*  PLT 407* 323   BMET  Recent Labs  06/30/12 1345 07/03/12 0400  NA 143 136  K 4.2 4.4  CL 102 101  CO2 29 27  GLUCOSE 75 124*  BUN 10 11  CREATININE 0.80 0.71  CALCIUM 10.0 9.0   PT/INR No results found for this basename: LABPROT, INR,  in the last 72 hours ABG No results found for this basename: PHART, PCO2, PO2, HCO3,  in the last 72 hours  Studies/Results: No results found.  Anti-infectives: Anti-infectives   Start     Dose/Rate Route Frequency Ordered Stop   07/02/12 1600  cefOXitin (MEFOXIN) 1 g in dextrose 5 % 50 mL IVPB     1 g 100 mL/hr over 30 Minutes Intravenous Every 6 hours 07/02/12 1450 07/03/12 0442   07/02/12 1600  clarithromycin (BIAXIN) tablet 1,000 mg     1,000 mg Oral Daily 07/02/12 1455     07/02/12 1500  clarithromycin (BIAXIN XL) 24 hr tablet 1,000 mg  Status:  Discontinued     1,000 mg Oral Daily 07/02/12 1450 07/02/12 1454   07/02/12 0826  cefOXitin (MEFOXIN) 2 g in dextrose 5 % 50 mL IVPB     2 g 100 mL/hr over 30 Minutes Intravenous On call to O.R. 07/02/12 0826  07/02/12 1027      Assessment/Plan: s/p Procedure(s) with comments: converted to laparotomy with gastric biopsy (N/A) - Laparoscopic Gastric Biospy attempted. converted to exploratory laparotomy with gastric wedge resection , open repair of umbilical hernia Plan for discharge tomorrow wean off oxygen; ambulate Pain control   LOS: 1 day    TSUEI,MATTHEW K. 07/03/2012

## 2012-07-04 MED ORDER — PROMETHAZINE HCL 12.5 MG PO TABS: 12.5000 mg | ORAL_TABLET | Freq: Four times a day (QID) | ORAL | Status: DC | PRN

## 2012-07-04 MED ORDER — OXYCODONE-ACETAMINOPHEN 5-325 MG PO TABS: 1.0000 | ORAL_TABLET | ORAL | Status: DC | PRN

## 2012-07-04 NOTE — Clinical Documentation Improvement (Signed)
MALNUTRITION DOCUMENTATION CLARIFICATION  THIS DOCUMENT IS NOT A PERMANENT PART OF THE MEDICAL RECORD  TO RESPOND TO THE THIS QUERY, FOLLOW THE INSTRUCTIONS BELOW:  1. If needed, update documentation for the patient's encounter via the notes activity.  2. Access this query again and click edit on the In [employer].  3. After updating, or not, click F2 to complete all highlighted (required) fields concerning your review. Select "additional documentation in the medical record" OR "no additional documentation provided".  4. Click Sign note button.  5. The deficiency will fall out of your In Basket *Please let us know if you are not able to complete this workflow by phone or e-mail (listed below).  Please update your documentation within the medical record to reflect your response to this query.                                                                                        07/04/12   Dear Dr. [name] / Associates,  In a better effort to capture your patient's severity of illness, reflect appropriate length of stay and utilization of resources, a review of the patient medical record has revealed the following indicators.    Based on your clinical judgment, please clarify and document in a progress note and/or discharge summary the clinical condition associated with the following supporting information:  In responding to this query please exercise your independent judgment.  The fact that a query is asked, does not imply that any particular answer is desired or expected. According to  nutrition consult note on 07/03/12  patient meets criteria for "severe malnutrition in the context of chronic illness"    If this is an appropriate diagnosis please document, if not please clarify the malnutrition status of patient if known. Thank you.     Possible Clinical Conditions?  _______Severe Malnutrition   _______Protein Calorie Malnutrition _______Severe Protein Calorie  Malnutrition _______Other Condition________________ _______Cannot clinically determine     Supporting Information: Risk Factors: Hernia, COPD, Fibromyalgia , Depression   Signs & Symptoms: -Ht:    25ft 5in   Wt:  96 lbs   -BMI: Subcutaneous Fat:   Orbital Region: mild/moderate wasting Upper Arm Region: mild/moderate wasting Thoracic and Lumbar Region: N/A   Muscle:  Temple Region: mild/moderate wasting Clavicle Bone Region: mild/moderate wasting Clavicle and Acromion Bone Region: mild/moderate wasting Scapular Bone Region: N/A Dorsal Hand: mild/moderate wasting Patellar Region: severe wasting Anterior Thigh Region: severe wasting Posterior Calf Region: severe wasting  Edema: None observed  -Nutrition Consult:             Pt meets criteria for severe MALNUTRITION in the context of chronic illness as evidenced by 16.5% weight loss in the past 6 months in addition to pt with visible severe muscle wasting and subcutaneous fat loss in patellar region, anterior thigh region, and posterior calf region.     You may use possible, probable, or suspect with inpatient documentation. possible, probable, suspected diagnoses MUST be documented at the time of discharge  Reviewed: additional documentation in the medical record  Thank Barrie Dunker RN,BSN  Clinical Documentation Specialist:  Pager [phone] Health Information  Management [facility]

## 2012-07-04 NOTE — Discharge Summary (Signed)
Physician Discharge Summary  Patient ID: Cynthia Bowen MRN: [id number] DOB/AGE: 27-Jul-1945 67 y.o.  Admit date: 07/02/2012 Discharge date: 07/04/2012  Admission Diagnoses:1.  Supraumbilical hernia      2.  Gastric wall thickening   Discharge Diagnoses: 1.  Supraumbilical hernia s/p repair        2.  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 severe malnutrition in the context of chronic illness    Active Problems:   * No active hospital problems. *   Discharged Condition: good  Hospital Course: s/p open repair of supraumbilical hernia and wedge biopsy of gastric wall.  Kept her in hospital for two days for pain control.  On liquids, tolerating well.    Consults: None  Significant Diagnostic Studies: none  Treatments: surgery: hernia repair and wedge biopsy of the stomach  Discharge Exam: Blood pressure 127/68, pulse 71, temperature 98.1 F (36.7 C), temperature source Oral, resp. rate 20, height 5\' 5"  (1.651 m), weight 96 lb 5 oz (43.687 kg), SpO2 97.00%. General appearance: alert, cooperative and no distress GI: soft, non-tender; bowel sounds normal; no masses,  no organomegaly Incision c/d/i  Disposition: 01-Home or Self Care   Future Appointments Provider Department Dept Phone   07/16/2012 10:30 AM Tonye Pearson, MD [facility] [phone]   07/21/2012 1:30 PM [name]. Tsuei, MD [facility], [place] [phone]    Resume all home meds    Medication List    ASK your doctor about these medications       albuterol 108 (90 BASE) MCG/ACT inhaler  Commonly known as:  PROVENTIL HFA;VENTOLIN HFA  Inhale 2 puffs into the lungs every 4 (four) hours as needed for wheezing (cough, shortness of breath or wheezing.).     azithromycin 500 MG tablet  Commonly known as:  ZITHROMAX  Take 500 mg by mouth daily.     clarithromycin 500 MG 24 hr tablet  Commonly known as:  BIAXIN XL  Take 2 tablets (1,000 mg total) by  mouth daily.     dextromethorphan-guaiFENesin 30-600 MG per 12 hr tablet  Commonly known as:  MUCINEX DM  Take 1 tablet by mouth every 12 (twelve) hours.     diphenhydrAMINE 25 mg capsule  Commonly known as:  BENADRYL  Take 25-50 mg by mouth every 6 (six) hours as needed. Itching     EPINEPHrine 0.3 mg/0.3 mL Devi  Commonly known as:  EPIPEN  Inject 0.3 mLs (0.3 mg total) into the muscle once.     FLUoxetine 20 MG capsule  Commonly known as:  PROZAC  Take 20 mg by mouth daily before breakfast.     multivitamin tablet  Take 1 tablet by mouth daily.     ondansetron 8 MG disintegrating tablet  Commonly known as:  ZOFRAN-ODT  Take 1 tablet (8 mg total) by mouth every 8 (eight) hours as needed. For nausea     pantoprazole 40 MG tablet  Commonly known as:  PROTONIX  Take 40 mg by mouth daily.     triamcinolone cream 0.1 %  Commonly known as:  KENALOG  Apply topically 2 (two) times daily.           Follow-up Information   Follow up with TSUEI,MATTHEW K., MD. Schedule an appointment as soon as possible for a visit in 3 weeks.   Contact information:   [address] [place] [address] [phone]  Percocet as needed for pain Phenergan as needed for nausea Boost supplements  Signed: TSUEI,MATTHEW K. 07/04/2012, 9:46 AM

## 2012-07-07 ENCOUNTER — Telehealth (INDEPENDENT_AMBULATORY_CARE_PROVIDER_SITE_OTHER): Payer: Self-pay | Admitting: General Surgery

## 2012-07-07 ENCOUNTER — Inpatient Hospital Stay (HOSPITAL_COMMUNITY): Payer: Medicare Other | Admitting: Anesthesiology

## 2012-07-07 ENCOUNTER — Inpatient Hospital Stay (HOSPITAL_COMMUNITY)
Admission: EM | Admit: 2012-07-07 | Discharge: 2012-07-26 | DRG: 907 | Disposition: A | Discharge status: Home Health | Payer: Medicare Other | Source: Ambulatory Visit | Attending: Surgery | Admitting: Surgery

## 2012-07-07 ENCOUNTER — Encounter (HOSPITAL_COMMUNITY): Payer: Self-pay | Admitting: Anesthesiology

## 2012-07-07 ENCOUNTER — Emergency Department (HOSPITAL_COMMUNITY): Payer: Medicare Other

## 2012-07-07 ENCOUNTER — Encounter (HOSPITAL_COMMUNITY)
Admission: EM | Disposition: A | Discharge status: Home Health | Payer: Self-pay | Source: Ambulatory Visit | Attending: Surgery

## 2012-07-07 ENCOUNTER — Encounter (HOSPITAL_COMMUNITY): Payer: Self-pay | Admitting: Emergency Medicine

## 2012-07-07 DIAGNOSIS — E43 Unspecified severe protein-calorie malnutrition: Secondary | ICD-10-CM | POA: Diagnosis present

## 2012-07-07 DIAGNOSIS — Y838 Other surgical procedures as the cause of abnormal reaction of the patient, or of later complication, without mention of misadventure at the time of the procedure: Secondary | ICD-10-CM | POA: Diagnosis present

## 2012-07-07 DIAGNOSIS — Z87891 Personal history of nicotine dependence: Secondary | ICD-10-CM

## 2012-07-07 DIAGNOSIS — IMO0001 Reserved for inherently not codable concepts without codable children: Secondary | ICD-10-CM | POA: Diagnosis present

## 2012-07-07 DIAGNOSIS — K219 Gastro-esophageal reflux disease without esophagitis: Secondary | ICD-10-CM | POA: Diagnosis present

## 2012-07-07 DIAGNOSIS — N200 Calculus of kidney: Secondary | ICD-10-CM | POA: Diagnosis present

## 2012-07-07 DIAGNOSIS — IMO0002 Reserved for concepts with insufficient information to code with codable children: Secondary | ICD-10-CM

## 2012-07-07 DIAGNOSIS — K319 Disease of stomach and duodenum, unspecified: Secondary | ICD-10-CM | POA: Diagnosis present

## 2012-07-07 DIAGNOSIS — K651 Peritoneal abscess: Secondary | ICD-10-CM | POA: Diagnosis present

## 2012-07-07 DIAGNOSIS — J40 Bronchitis, not specified as acute or chronic: Secondary | ICD-10-CM | POA: Diagnosis present

## 2012-07-07 DIAGNOSIS — K316 Fistula of stomach and duodenum: Secondary | ICD-10-CM | POA: Diagnosis present

## 2012-07-07 DIAGNOSIS — E876 Hypokalemia: Secondary | ICD-10-CM | POA: Diagnosis not present

## 2012-07-07 DIAGNOSIS — J189 Pneumonia, unspecified organism: Secondary | ICD-10-CM | POA: Diagnosis not present

## 2012-07-07 DIAGNOSIS — K2990 Gastroduodenitis, unspecified, without bleeding: Secondary | ICD-10-CM | POA: Diagnosis present

## 2012-07-07 DIAGNOSIS — T8130XA Disruption of wound, unspecified, initial encounter: Principal | ICD-10-CM | POA: Diagnosis present

## 2012-07-07 DIAGNOSIS — F329 Major depressive disorder, single episode, unspecified: Secondary | ICD-10-CM | POA: Diagnosis present

## 2012-07-07 DIAGNOSIS — F3289 Other specified depressive episodes: Secondary | ICD-10-CM | POA: Diagnosis present

## 2012-07-07 DIAGNOSIS — Z79899 Other long term (current) drug therapy: Secondary | ICD-10-CM

## 2012-07-07 DIAGNOSIS — K659 Peritonitis, unspecified: Secondary | ICD-10-CM | POA: Diagnosis present

## 2012-07-07 DIAGNOSIS — R634 Abnormal weight loss: Secondary | ICD-10-CM | POA: Diagnosis present

## 2012-07-07 DIAGNOSIS — J4489 Other specified chronic obstructive pulmonary disease: Secondary | ICD-10-CM | POA: Diagnosis present

## 2012-07-07 DIAGNOSIS — R109 Unspecified abdominal pain: Secondary | ICD-10-CM | POA: Diagnosis present

## 2012-07-07 DIAGNOSIS — D72829 Elevated white blood cell count, unspecified: Secondary | ICD-10-CM | POA: Diagnosis not present

## 2012-07-07 DIAGNOSIS — K2970 Gastritis, unspecified, without bleeding: Secondary | ICD-10-CM | POA: Diagnosis present

## 2012-07-07 HISTORY — PX: LAPAROTOMY: SHX154

## 2012-07-07 HISTORY — PX: STOMACH SURGERY: SHX791

## 2012-07-07 LAB — CBC WITH DIFFERENTIAL/PLATELET
Eosinophils Absolute: 0 10*3/uL (ref 0.0–0.7)
Hemoglobin: 12.6 g/dL (ref 12.0–15.0)
Lymphs Abs: 0.5 10*3/uL — ABNORMAL LOW (ref 0.7–4.0)
MCH: 29.4 pg (ref 26.0–34.0)
Monocytes Relative: 5 % (ref 3–12)
Neutrophils Relative %: 93 % — ABNORMAL HIGH (ref 43–77)
RBC: 4.28 MIL/uL (ref 3.87–5.11)

## 2012-07-07 LAB — LIPASE, BLOOD: Lipase: 11 U/L (ref 11–59)

## 2012-07-07 LAB — COMPREHENSIVE METABOLIC PANEL
Alkaline Phosphatase: 88 U/L (ref 39–117)
BUN: 12 mg/dL (ref 6–23)
Chloride: 93 mEq/L — ABNORMAL LOW (ref 96–112)
GFR calc Af Amer: >90 mL/min (ref >90)
GFR calc non Af Amer: 87 mL/min — ABNORMAL LOW (ref >90)
Glucose, Bld: 122 mg/dL — ABNORMAL HIGH (ref 70–99)
Potassium: 4 mEq/L (ref 3.5–5.1)
Total Bilirubin: 0.9 mg/dL (ref 0.3–1.2)

## 2012-07-07 SURGERY — LAPAROTOMY, EXPLORATORY
Anesthesia: General | Wound class: Dirty or Infected

## 2012-07-07 MED ORDER — SODIUM CHLORIDE 0.9 % IR SOLN
Status: DC | PRN
  Administered 2012-07-07: 4000 mL

## 2012-07-07 MED ORDER — HYDROMORPHONE HCL PF 1 MG/ML IJ SOLN
0.5000 mg | Freq: Once | INTRAMUSCULAR | Status: AC
  Administered 2012-07-07: 0.5 mg via INTRAVENOUS
  Filled 2012-07-07: qty 1

## 2012-07-07 MED ORDER — NALOXONE HCL 0.4 MG/ML IJ SOLN: 0.4000 mg | INTRAMUSCULAR | Status: DC | PRN

## 2012-07-07 MED ORDER — ACETAMINOPHEN 10 MG/ML IV SOLN
INTRAVENOUS | Status: DC | PRN
  Administered 2012-07-07: 1000 mg via INTRAVENOUS

## 2012-07-07 MED ORDER — POTASSIUM CHLORIDE IN NACL 20-0.9 MEQ/L-% IV SOLN
INTRAVENOUS | Status: DC
  Administered 2012-07-07: 15:00:00 via INTRAVENOUS
  Filled 2012-07-07 (×2): qty 1000

## 2012-07-07 MED ORDER — MIDAZOLAM HCL 5 MG/5ML IJ SOLN
INTRAMUSCULAR | Status: DC | PRN
  Administered 2012-07-07: 2 mg via INTRAVENOUS

## 2012-07-07 MED ORDER — PROMETHAZINE HCL 25 MG/ML IJ SOLN
6.2500 mg | Freq: Four times a day (QID) | INTRAMUSCULAR | Status: DC | PRN
  Administered 2012-07-12 – 2012-07-15 (×5): 6.25 mg via INTRAVENOUS
  Filled 2012-07-07 (×6): qty 1

## 2012-07-07 MED ORDER — DIPHENHYDRAMINE HCL 50 MG/ML IJ SOLN: 12.5000 mg | Freq: Four times a day (QID) | INTRAMUSCULAR | Status: DC | PRN

## 2012-07-07 MED ORDER — SODIUM CHLORIDE 0.9 % IV BOLUS (SEPSIS)
500.0000 mL | Freq: Once | INTRAVENOUS | Status: AC
  Administered 2012-07-07: 500 mL via INTRAVENOUS

## 2012-07-07 MED ORDER — IOHEXOL 300 MG/ML  SOLN
50.0000 mL | Freq: Once | INTRAMUSCULAR | Status: AC | PRN
  Administered 2012-07-07: 50 mL via ORAL

## 2012-07-07 MED ORDER — PANTOPRAZOLE SODIUM 40 MG IV SOLR
40.0000 mg | Freq: Two times a day (BID) | INTRAVENOUS | Status: DC
  Administered 2012-07-07 – 2012-07-10 (×7): 40 mg via INTRAVENOUS
  Filled 2012-07-07 (×9): qty 40

## 2012-07-07 MED ORDER — GLYCOPYRROLATE 0.2 MG/ML IJ SOLN
INTRAMUSCULAR | Status: DC | PRN
  Administered 2012-07-07: 0.6 mg via INTRAVENOUS

## 2012-07-07 MED ORDER — VANCOMYCIN HCL 1000 MG IV SOLR
750.0000 mg | INTRAVENOUS | Status: DC
  Filled 2012-07-07: qty 750

## 2012-07-07 MED ORDER — SUCCINYLCHOLINE CHLORIDE 20 MG/ML IJ SOLN
INTRAMUSCULAR | Status: DC | PRN
  Administered 2012-07-07: 100 mg via INTRAVENOUS

## 2012-07-07 MED ORDER — ENOXAPARIN SODIUM 40 MG/0.4ML ~~LOC~~ SOLN
40.0000 mg | SUBCUTANEOUS | Status: DC
  Administered 2012-07-08 – 2012-07-09 (×2): 40 mg via SUBCUTANEOUS
  Filled 2012-07-07 (×2): qty 0.4

## 2012-07-07 MED ORDER — NEOSTIGMINE METHYLSULFATE 1 MG/ML IJ SOLN
INTRAMUSCULAR | Status: DC | PRN
  Administered 2012-07-07: 5 mg via INTRAVENOUS

## 2012-07-07 MED ORDER — DEXAMETHASONE SODIUM PHOSPHATE 4 MG/ML IJ SOLN
INTRAMUSCULAR | Status: DC | PRN
  Administered 2012-07-07: 10 mg via INTRAVENOUS

## 2012-07-07 MED ORDER — FENTANYL CITRATE 0.05 MG/ML IJ SOLN
INTRAMUSCULAR | Status: DC | PRN
  Administered 2012-07-07 (×3): 50 ug via INTRAVENOUS
  Administered 2012-07-07: 100 ug via INTRAVENOUS

## 2012-07-07 MED ORDER — LACTATED RINGERS IV SOLN
INTRAVENOUS | Status: DC | PRN
  Administered 2012-07-07 (×2): via INTRAVENOUS

## 2012-07-07 MED ORDER — PIPERACILLIN-TAZOBACTAM 3.375 G IVPB 30 MIN
3.3750 g | INTRAVENOUS | Status: AC
  Administered 2012-07-07: 3.375 g via INTRAVENOUS
  Filled 2012-07-07: qty 50

## 2012-07-07 MED ORDER — SODIUM CHLORIDE 0.9 % IJ SOLN: 9.0000 mL | INTRAMUSCULAR | Status: DC | PRN

## 2012-07-07 MED ORDER — ONDANSETRON HCL 4 MG/2ML IJ SOLN
INTRAMUSCULAR | Status: DC | PRN
  Administered 2012-07-07: 4 mg via INTRAVENOUS

## 2012-07-07 MED ORDER — ALBUTEROL SULFATE HFA 108 (90 BASE) MCG/ACT IN AERS
INHALATION_SPRAY | RESPIRATORY_TRACT | Status: DC | PRN
  Administered 2012-07-07: 5 via RESPIRATORY_TRACT

## 2012-07-07 MED ORDER — ONDANSETRON HCL 4 MG PO TABS: 4.0000 mg | ORAL_TABLET | Freq: Four times a day (QID) | ORAL | Status: DC | PRN

## 2012-07-07 MED ORDER — ONDANSETRON HCL 4 MG/2ML IJ SOLN
4.0000 mg | Freq: Once | INTRAMUSCULAR | Status: AC
  Administered 2012-07-07: 4 mg via INTRAVENOUS
  Filled 2012-07-07: qty 2

## 2012-07-07 MED ORDER — HYDROMORPHONE HCL PF 1 MG/ML IJ SOLN: 0.2500 mg | INTRAMUSCULAR | Status: DC | PRN

## 2012-07-07 MED ORDER — PROMETHAZINE HCL 25 MG/ML IJ SOLN: 6.2500 mg | INTRAMUSCULAR | Status: DC | PRN

## 2012-07-07 MED ORDER — PIPERACILLIN-TAZOBACTAM 3.375 G IVPB
3.3750 g | Freq: Three times a day (TID) | INTRAVENOUS | Status: DC
  Administered 2012-07-08 – 2012-07-14 (×19): 3.375 g via INTRAVENOUS
  Filled 2012-07-07 (×20): qty 50

## 2012-07-07 MED ORDER — KETAMINE HCL 10 MG/ML IJ SOLN
INTRAMUSCULAR | Status: DC | PRN
  Administered 2012-07-07: 25 mg via INTRAVENOUS

## 2012-07-07 MED ORDER — SODIUM CHLORIDE 0.9 % IV BOLUS (SEPSIS)
1000.0000 mL | Freq: Once | INTRAVENOUS | Status: AC
  Administered 2012-07-07: 1000 mL via INTRAVENOUS

## 2012-07-07 MED ORDER — PROPOFOL 10 MG/ML IV BOLUS
INTRAVENOUS | Status: DC | PRN
  Administered 2012-07-07: 100 mg via INTRAVENOUS

## 2012-07-07 MED ORDER — POTASSIUM CHLORIDE IN NACL 20-0.9 MEQ/L-% IV SOLN
INTRAVENOUS | Status: DC
  Administered 2012-07-07 – 2012-07-10 (×3): via INTRAVENOUS
  Filled 2012-07-07 (×9): qty 1000

## 2012-07-07 MED ORDER — HYDROMORPHONE HCL PF 1 MG/ML IJ SOLN
INTRAMUSCULAR | Status: DC | PRN
  Administered 2012-07-07 (×2): 1 mg via INTRAVENOUS

## 2012-07-07 MED ORDER — ACETAMINOPHEN 10 MG/ML IV SOLN: 1000.0000 mg | Freq: Once | INTRAVENOUS | Status: DC | PRN

## 2012-07-07 MED ORDER — PIPERACILLIN-TAZOBACTAM 3.375 G IVPB
3.3750 g | Freq: Three times a day (TID) | INTRAVENOUS | Status: DC
  Filled 2012-07-07 (×2): qty 50

## 2012-07-07 MED ORDER — HYDROMORPHONE 0.3 MG/ML IV SOLN
INTRAVENOUS | Status: DC
  Administered 2012-07-07: 0.8 mg via INTRAVENOUS
  Administered 2012-07-07: 1.19 mg via INTRAVENOUS
  Administered 2012-07-08: 16:00:00 via INTRAVENOUS
  Administered 2012-07-08: 1.48 mg via INTRAVENOUS
  Administered 2012-07-08: 3.79 mg via INTRAVENOUS
  Administered 2012-07-08: 3.39 mg via INTRAVENOUS
  Administered 2012-07-09: 0.3 mg via INTRAVENOUS
  Administered 2012-07-09: 0.2 mg via INTRAVENOUS
  Administered 2012-07-09: 5.99 mg via INTRAVENOUS
  Administered 2012-07-09: 1.19 mg via INTRAVENOUS
  Administered 2012-07-09: 2.19 mg via INTRAVENOUS
  Administered 2012-07-09: 2.39 mg via INTRAVENOUS
  Administered 2012-07-10: 2.78 mg via INTRAVENOUS
  Administered 2012-07-10: 01:00:00 via INTRAVENOUS
  Administered 2012-07-10: 3.79 mg via INTRAVENOUS
  Filled 2012-07-07 (×5): qty 25

## 2012-07-07 MED ORDER — ROCURONIUM BROMIDE 100 MG/10ML IV SOLN
INTRAVENOUS | Status: DC | PRN
  Administered 2012-07-07: 30 mg via INTRAVENOUS

## 2012-07-07 MED ORDER — DIPHENHYDRAMINE HCL 12.5 MG/5ML PO ELIX: 12.5000 mg | ORAL_SOLUTION | Freq: Four times a day (QID) | ORAL | Status: DC | PRN

## 2012-07-07 MED ORDER — ONDANSETRON HCL 4 MG/2ML IJ SOLN: 4.0000 mg | Freq: Four times a day (QID) | INTRAMUSCULAR | Status: DC | PRN

## 2012-07-07 MED ORDER — LABETALOL HCL 5 MG/ML IV SOLN
INTRAVENOUS | Status: DC | PRN
  Administered 2012-07-07: 5 mg via INTRAVENOUS

## 2012-07-07 MED ORDER — ACETAMINOPHEN 10 MG/ML IV SOLN
INTRAVENOUS | Status: AC
  Filled 2012-07-07: qty 100

## 2012-07-07 MED ORDER — VANCOMYCIN HCL IN DEXTROSE 1-5 GM/200ML-% IV SOLN
1000.0000 mg | Freq: Once | INTRAVENOUS | Status: DC
  Filled 2012-07-07: qty 200

## 2012-07-07 MED ORDER — FENTANYL CITRATE 0.05 MG/ML IJ SOLN
INTRAMUSCULAR | Status: AC
  Filled 2012-07-07: qty 2

## 2012-07-07 MED ORDER — METOCLOPRAMIDE HCL 5 MG/ML IJ SOLN
INTRAMUSCULAR | Status: DC | PRN
  Administered 2012-07-07: 10 mg via INTRAVENOUS

## 2012-07-07 MED ORDER — HYDROMORPHONE 0.3 MG/ML IV SOLN
INTRAVENOUS | Status: AC
  Administered 2012-07-07: 0.3 mg
  Filled 2012-07-07: qty 25

## 2012-07-07 MED ORDER — HYDROMORPHONE HCL PF 1 MG/ML IJ SOLN
0.5000 mg | INTRAMUSCULAR | Status: DC | PRN
  Filled 2012-07-07: qty 1

## 2012-07-07 MED ORDER — IOHEXOL 300 MG/ML  SOLN
100.0000 mL | Freq: Once | INTRAMUSCULAR | Status: AC | PRN
Start: 2012-07-07 — End: 2012-07-07
  Administered 2012-07-07: 80 mL via INTRAVENOUS

## 2012-07-07 MED ORDER — MEPERIDINE HCL 50 MG/ML IJ SOLN: 6.2500 mg | INTRAMUSCULAR | Status: DC | PRN

## 2012-07-07 SURGICAL SUPPLY — 49 items
APPLICATOR COTTON TIP 6IN STRL (MISCELLANEOUS) IMPLANT
BLADE EXTENDED COATED 6.5IN (ELECTRODE) ×2 IMPLANT
BLADE HEX COATED 2.75 (ELECTRODE) ×2 IMPLANT
CANISTER SUCTION 2500CC (MISCELLANEOUS) ×2 IMPLANT
CLOTH BEACON ORANGE TIMEOUT ST (SAFETY) ×2 IMPLANT
COVER MAYO STAND STRL (DRAPES) ×2 IMPLANT
DRAIN CHANNEL 19F RND (DRAIN) ×2 IMPLANT
DRAPE LAPAROSCOPIC ABDOMINAL (DRAPES) ×2 IMPLANT
DRAPE UTILITY XL STRL (DRAPES) ×4 IMPLANT
DRAPE WARM FLUID 44X44 (DRAPE) ×2 IMPLANT
ELECT REM PT RETURN 9FT ADLT (ELECTROSURGICAL) ×2
ELECTRODE REM PT RTRN 9FT ADLT (ELECTROSURGICAL) ×1 IMPLANT
EVACUATOR SILICONE 100CC (DRAIN) ×2 IMPLANT
GLOVE BIOGEL M 7.0 STRL (GLOVE) ×4 IMPLANT
GLOVE BIOGEL PI IND STRL 7.0 (GLOVE) IMPLANT
GLOVE BIOGEL PI IND STRL 7.5 (GLOVE) ×2 IMPLANT
GLOVE BIOGEL PI INDICATOR 7.0 (GLOVE)
GLOVE BIOGEL PI INDICATOR 7.5 (GLOVE) ×2
GLOVE ECLIPSE 8.0 STRL XLNG CF (GLOVE) ×4 IMPLANT
GLOVE INDICATOR 8.0 STRL GRN (GLOVE) ×4 IMPLANT
GLOVE SURG ORTHO 8.0 STRL STRW (GLOVE) IMPLANT
GOWN STRL NON-REIN LRG LVL3 (GOWN DISPOSABLE) ×4 IMPLANT
GOWN STRL REIN XL XLG (GOWN DISPOSABLE) ×8 IMPLANT
KIT BASIN OR (CUSTOM PROCEDURE TRAY) ×2 IMPLANT
NS IRRIG 1000ML POUR BTL (IV SOLUTION) ×2 IMPLANT
PACK GENERAL/GYN (CUSTOM PROCEDURE TRAY) ×2 IMPLANT
PENCIL BUTTON HOLSTER BLD 10FT (ELECTRODE) ×2 IMPLANT
SPONGE GAUZE 4X4 12PLY (GAUZE/BANDAGES/DRESSINGS) IMPLANT
SPONGE LAP 18X18 X RAY DECT (DISPOSABLE) ×4 IMPLANT
STAPLER VISISTAT 35W (STAPLE) ×2 IMPLANT
SUCTION POOLE TIP (SUCTIONS) ×2 IMPLANT
SUT ETHILON 3 0 PS 1 (SUTURE) ×2 IMPLANT
SUT NOV 1 T60/GS (SUTURE) IMPLANT
SUT NOVA NAB DX-16 0-1 5-0 T12 (SUTURE) ×6 IMPLANT
SUT SILK 2 0 (SUTURE) ×1
SUT SILK 2 0 SH CR/8 (SUTURE) ×2 IMPLANT
SUT SILK 2-0 18XBRD TIE 12 (SUTURE) ×1 IMPLANT
SUT SILK 3 0 (SUTURE) ×1
SUT SILK 3 0 SH CR/8 (SUTURE) ×2 IMPLANT
SUT SILK 3-0 18XBRD TIE 12 (SUTURE) ×1 IMPLANT
SUT VICRYL 2 0 18  UND BR (SUTURE)
SUT VICRYL 2 0 18 UND BR (SUTURE) IMPLANT
TAPE CLOTH SURG 6X10 WHT LF (GAUZE/BANDAGES/DRESSINGS) ×2 IMPLANT
TOWEL OR 17X26 10 PK STRL BLUE (TOWEL DISPOSABLE) ×4 IMPLANT
TOWEL OR NON WOVEN STRL DISP B (DISPOSABLE) ×2 IMPLANT
TRAY FOLEY CATH 14FRSI W/METER (CATHETERS) IMPLANT
WATER STERILE IRR 1500ML POUR (IV SOLUTION) ×2 IMPLANT
YANKAUER SUCT BULB TIP 10FT TU (MISCELLANEOUS) ×2 IMPLANT
YANKAUER SUCT BULB TIP NO VENT (SUCTIONS) ×2 IMPLANT

## 2012-07-07 NOTE — Transfer of Care (Signed)
Immediate Anesthesia Transfer of Care Note  Patient: Cynthia Bowen  Procedure(s) Performed: Procedure(s): EXPLORATORY LAPAROTOMY repair of gastric perforation with omental graham patch, drainage of abdominal abcess (N/A)  Patient Location: PACU  Anesthesia Type:General  Level of Consciousness: awake, oriented, patient cooperative and responds to stimulation  Airway & Oxygen Therapy: Patient Spontanous Breathing and Patient connected to face mask oxygen  Post-op Assessment: Report given to PACU RN, Post -op Vital signs reviewed and stable and Patient moving all extremities X 4  Post vital signs: Reviewed and stable  Complications: No apparent anesthesia complications

## 2012-07-07 NOTE — Telephone Encounter (Signed)
Pt's (?) husband called to report pt is "in excruciating pain and she can't hardly move."  Advised him to take her to the emergency room if the pain is that bad, as they have the necessary means to give her pain medication and work her up to find out why the pain is so bad.  He states he will call an ambulance to take her because she can't move herself.  Will call the PA at [facility] to let them know to expect her.

## 2012-07-07 NOTE — Progress Notes (Signed)
ANTIBIOTIC CONSULT NOTE - INITIAL  Pharmacy Consult for vancomycin Indication: suspected pneumonia  Allergies  Allergen Reactions  . Avelox (Moxifloxacin Hcl In Nacl) Nausea And Vomiting  . Alendronate Sodium Nausea And Vomiting  . Aspirin Nausea Only  . Codeine Nausea And Vomiting  . Doxycycline   . Fluconazole   . Hydrocodone Nausea And Vomiting    GI distress  . Neurontin (Gabapentin) Other (See Comments)    Mood changes   . Sertraline Hcl Other (See Comments)    Hallucinations   . Sulfa Antibiotics Rash    Patient Measurements:   From 07/02/2012: Height 65 inches Weight 43.7 kg  Vital Signs: Temp: 100.9 F (38.3 C) (03/17 1220) Temp src: Oral (03/17 1220) BP: 156/83 mmHg (03/17 1220) Pulse Rate: 128 (03/17 1220) Intake/Output from previous day:   Intake/Output from this shift:    Labs:  Recent Labs  07/07/12 1001  WBC 20.5*  HGB 12.6  PLT 343  CREATININE 0.75   The CrCl is unknown because both a height and weight (above a minimum accepted value) are required for this calculation.  Estimated CrCl ~ 48 mL/min    Microbiology: Recent Results (from the past 720 hour(s))  SURGICAL PCR SCREEN     Status: None   Collection Time    06/30/12  1:05 PM      Result Value Range Status   MRSA, PCR NEGATIVE  NEGATIVE Final   Staphylococcus aureus NEGATIVE  NEGATIVE Final   Comment:            The Xpert SA Assay (FDA     approved for NASAL specimens     in patients over 66 years of age),     is one component of     a comprehensive surveillance     program.  Test performance has     been validated by [employer] for patients greater     than or equal to 35 year old.     It is not intended     to diagnose infection nor to     guide or monitor treatment.    Medical History: Past Medical History  Diagnosis Date  . COPD (chronic obstructive pulmonary disease)   . Pneumonia 12-2011  . GERD (gastroesophageal reflux disease)   . Headache   .  Arthritis     osteoarthritis  . Allergy   . Depression   . Neuromuscular disorder   . Osteoporosis   . Bronchitis     CURRENTLY AS OF 06/30/12 - HAS COUGH AND FINISHED ANTIBIOTIC FOR BRONCHITIS  . Fibromyalgia   . Pain     ABDOMINAL PAIN AND NAUSEA  . Pain     SOMETIMES PAIN RIGHT EAR AND NECK--STATES CAUSED BY A "LUMP" ON BACK OF EAR--USES KENALOG CREAM TOPICALLY AS NEEDED.    Medications:  Scheduled:  . [COMPLETED]  HYDROmorphone (DILAUDID) injection  0.5 mg Intravenous Once  . [COMPLETED]  HYDROmorphone (DILAUDID) injection  0.5 mg Intravenous Once  . [COMPLETED] ondansetron (ZOFRAN) IV  4 mg Intravenous Once  . [COMPLETED] ondansetron (ZOFRAN) IV  4 mg Intravenous Once  . pantoprazole (PROTONIX) IV  40 mg Intravenous Q12H   Infusions:  . 0.9 % NaCl with KCl 20 mEq / L 150 mL/hr at 07/07/12 1443  . piperacillin-tazobactam (ZOSYN)  IV    . [COMPLETED] sodium chloride Stopped (07/07/12 1329)  . [COMPLETED] sodium chloride Stopped (07/07/12 1439)  . vancomycin     PRN: HYDROmorphone (  DILAUDID) injection, [COMPLETED] iohexol, [COMPLETED] iohexol, ondansetron, promethazine  Assessment: 67 y/o F recently hospitalized 3/12-3/14 for supraumbilical hernia repair and gastric biopsy, now with persistent cough, low-grade fever, and leukocytosis following completion of oral antibiotic course for bronchitis.  CXR showed LLL opacity.  To begin empiric vancomycin and Zosyn for suspected pneumonia.   Pharmacy assistance was requested with vancomycin dosing.  Goal Range:  Vancomycin trough 15-20  Plan:  1. Vancomycin 1000 mg IV x 1, then 750 mg IV q24h. 2. Follow serum creatinine 3. Check vancomycin trough at steady-state 4. Zosyn 3.375 grams IV q8h (extended-infusion) as ordered by MD.  Elie Goody, PharmD, BCPS Pager: [phone] 07/07/2012  2:54 PM

## 2012-07-07 NOTE — Anesthesia Postprocedure Evaluation (Signed)
Anesthesia Post Note  Patient: Cynthia Bowen  Procedure(s) Performed: Procedure(s) (LRB): EXPLORATORY LAPAROTOMY repair of gastric perforation with omental graham patch, drainage of abdominal abcess (N/A)  Anesthesia type: General  Patient location: PACU  Post pain: Pain level controlled  Post assessment: Post-op Vital signs reviewed  Last Vitals: BP 126/83  Pulse 92  Temp(Src) 37 C (Oral)  Resp 16  Ht 5' 4.96" (1.65 m)  Wt 96 lb 5.5 oz (43.7 kg)  BMI 16.05 kg/m2  SpO2 96%  Post vital signs: Reviewed  Level of consciousness: sedated  Complications: No apparent anesthesia complications

## 2012-07-07 NOTE — H&P (Addendum)
Cynthia Bowen is an 67 y.o. female.   GI: Dr. [name] General surgery> Dr. [name] Chief Complaint: Abdominal pain HPI: She  is a 67 year old female a patient of Dr. [name] who underwent EGD evaluation for thickened antrum of the stomach. This was demonstrated on CT scan 04/17/12. On 07/02/12 she underwent umbilical hernia repair and gastric biopsy by Dr. [name]. She was discharged home on 07/04/12. She's been on liquids since dischargebut has had ongoing abdominal pain, nausea, some vomiting.  It got worse yesterday, she called our office this morning and was sent to the emergency room. Workup in the ER, shows normal electrolytes and LFTs. Lipase is 11. WBC is 20,500. The remainder of the CBC is normal. Portable chest shows small left and right pleural effusions which are new possible left lower lobe opacity. CT scan shows a perforation of the anterior wall of the stomach and the antral prepyloric region there was gas and oral contrast material extending outside the lumen of the stomach tracking cephalad under the left lobe of the liver there is also some free air immediately anterior to the liver as well as in the peritoneal enhancement in this region. Fluid-filled bronchi in the left lower lobe with a question of atelectasis/aspiration. Multiple nonobstructive calculi right renal collecting system. Extensive high-grade atherosclerosis. We're asked to see the patient in consultation in the ER.  Past Medical History  Diagnosis Date  . COPD (chronic obstructive pulmonary disease) 40 year hx of tobacco use   . Pneumonia 12-2011  . GERD (gastroesophageal reflux disease)   . Headache   . Arthritis     osteoarthritis  . Multiple allergies   . Depression   . Neuromuscular disorder   . Osteoporosis   . Bronchitis     CURRENTLY AS OF 06/30/12 - HAS COUGH AND FINISHED ANTIBIOTIC FOR BRONCHITIS  . Fibromyalgia   . Pain     ABDOMINAL PAIN AND NAUSEA  . Pain     SOMETIMES PAIN RIGHT EAR  AND NECK--STATES CAUSED BY A "LUMP" ON BACK OF EAR--USES KENALOG CREAM TOPICALLY AS NEEDED.    Past Surgical History  Procedure Laterality Date  . Abdominal hysterectomy    . Esophagogastroduodenoscopy  04/18/2012    Procedure: ESOPHAGOGASTRODUODENOSCOPY (EGD);  Surgeon: [name], MD;  Location: [facility];  Service: Endoscopy;  Laterality: N/A;  . Eus  05/29/2012    Procedure: UPPER ENDOSCOPIC ULTRASOUND (EUS) LINEAR;  Surgeon: [name], MD;  Location: [facility];  Service: Endoscopy;  Laterality: N/A;  . Appendectomy    . Spine surgery      CERVICAL SPINE SURGERY X 2 - INCLUDING FUSION; LUMBAR SURGERY FOR RUPTURED DISC  . Eye surgery      BILATERAL CATARACT EXTRACTIONS  . Laparoscopic abdominal exploration N/A 07/02/2012    Procedure: converted to laparotomy with gastric biopsy;  Surgeon: [name]. [name], MD;  Location: [facility];  Service: General;  Laterality: N/A;  Laparoscopic Gastric Biospy attempted. converted to exploratory laparotomy with gastric wedge resection , open repair of umbilical hernia    Family History  Problem Relation Age of Onset  . COPD Mother   . Hypertension Maternal Grandmother    Social History:  reports that she quit smoking about 7 years ago. Her smoking use included Cigarettes. She started smoking about 9 years ago. She has a 17.5 pack-year smoking history. She has never used smokeless tobacco. She reports that she does not drink alcohol or use illicit drugs.  Prior to  Admission medications   Medication Sig Start Date End Date Taking? Authorizing Provider  albuterol (PROVENTIL HFA;VENTOLIN HFA) 108 (90 BASE) MCG/ACT inhaler Inhale 2 puffs into the lungs every 4 (four) hours as needed for wheezing (cough, shortness of breath or wheezing.). 06/30/12  Yes Phillips Odor, MD  clarithromycin (BIAXIN XL) 500 MG 24 hr tablet Take 2 tablets (1,000 mg total) by mouth daily. 06/30/12  Yes Phillips Odor, MD  dextromethorphan-guaiFENesin [facility] DM)  30-600 MG per 12 hr tablet Take 1 tablet by mouth every 12 (twelve) hours.   Yes Historical Provider, MD  diphenhydrAMINE (BENADRYL) 25 mg capsule Take 25-50 mg by mouth every 6 (six) hours as needed. Itching   Yes Historical Provider, MD  FLUoxetine (PROZAC) 20 MG capsule Take 20 mg by mouth daily before breakfast.   Yes Historical Provider, MD  Multiple Vitamin (MULTIVITAMIN) tablet Take 1 tablet by mouth daily.   Yes Historical Provider, MD  ondansetron (ZOFRAN-ODT) 8 MG disintegrating tablet Take 1 tablet (8 mg total) by mouth every 8 (eight) hours as needed. For nausea 05/23/12  Yes Tonye Pearson, MD  oxyCODONE-acetaminophen (PERCOCET/ROXICET) 5-325 MG per tablet Take 1 tablet by mouth every 4 (four) hours as needed for pain. 07/04/12  Yes [name]. Tsuei, MD  pantoprazole (PROTONIX) 40 MG tablet Take 40 mg by mouth daily.   Yes Historical Provider, MD  promethazine (PHENERGAN) 12.5 MG tablet Take 1 tablet (12.5 mg total) by mouth every 6 (six) hours as needed for nausea. 07/04/12  Yes [name]. Tsuei, MD  ranitidine (ZANTAC) 150 MG tablet Take 150 mg by mouth 2 (two) times daily.   Yes Historical Provider, MD  triamcinolone cream (KENALOG) 0.1 % Apply 1 application topically as needed (as needed for right ear pain). 05/05/12  Yes Gwenlyn Found Copland, MD  EPINEPHrine (EPIPEN) 0.3 mg/0.3 mL DEVI Inject 0.3 mLs (0.3 mg total) into the muscle once. 04/22/12   Pearline Cables, MD     Allergies:  Allergies  Allergen Reactions  . Avelox (Moxifloxacin Hcl In Nacl) Nausea And Vomiting  . Alendronate Sodium Nausea And Vomiting  . Aspirin Nausea Only  . Codeine Nausea And Vomiting  . Doxycycline   . Fluconazole   . Hydrocodone Nausea And Vomiting    GI distress  . Neurontin (Gabapentin) Other (See Comments)    Mood changes   . Sertraline Hcl Other (See Comments)    Hallucinations   . Sulfa Antibiotics Rash     (Not in a hospital admission)  Results for orders placed during the  hospital encounter of 07/07/12 (from the past 48 hour(s))  CBC WITH DIFFERENTIAL     Status: Abnormal   Collection Time    07/07/12 10:01 AM      Result Value Range   WBC 20.5 (*) 4.0 - 10.5 K/uL   RBC 4.28  3.87 - 5.11 MIL/uL   Hemoglobin 12.6  12.0 - 15.0 g/dL   HCT 16.1  09.6 - 04.5 %   MCV 84.6  78.0 - 100.0 fL   MCH 29.4  26.0 - 34.0 pg   MCHC 34.8  30.0 - 36.0 g/dL   RDW 40.9  81.1 - 91.4 %   Platelets 343  150 - 400 K/uL   Neutrophils Relative 93 (*) 43 - 77 %   Neutro Abs 19.0 (*) 1.7 - 7.7 K/uL   Lymphocytes Relative 2 (*) 12 - 46 %   Lymphs Abs 0.5 (*) 0.7 - 4.0 K/uL   Monocytes Relative  5  3 - 12 %   Monocytes Absolute 1.1 (*) 0.1 - 1.0 K/uL   Eosinophils Relative 0  0 - 5 %   Eosinophils Absolute 0.0  0.0 - 0.7 K/uL   Basophils Relative 0  0 - 1 %   Basophils Absolute 0.0  0.0 - 0.1 K/uL  COMPREHENSIVE METABOLIC PANEL     Status: Abnormal   Collection Time    07/07/12 10:01 AM      Result Value Range   Sodium 132 (*) 135 - 145 mEq/L   Potassium 4.0  3.5 - 5.1 mEq/L   Chloride 93 (*) 96 - 112 mEq/L   CO2 28  19 - 32 mEq/L   Glucose, Bld 122 (*) 70 - 99 mg/dL   BUN 12  6 - 23 mg/dL   Creatinine, Ser 1.61  0.50 - 1.10 mg/dL   Calcium 9.5  8.4 - 09.6 mg/dL   Total Protein 8.1  6.0 - 8.3 g/dL   Albumin 3.2 (*) 3.5 - 5.2 g/dL   AST 12  0 - 37 U/L   ALT 11  0 - 35 U/L   Alkaline Phosphatase 88  39 - 117 U/L   Total Bilirubin 0.9  0.3 - 1.2 mg/dL   GFR calc non Af Amer 87 (*) >90 mL/min   GFR calc Af Amer >90  >90 mL/min   Comment:            The eGFR has been calculated     using the CKD EPI equation.     This calculation has not been     validated in all clinical     situations.     eGFR's persistently     <90 mL/min signify     possible Chronic Kidney Disease.  LIPASE, BLOOD     Status: None   Collection Time    07/07/12 10:01 AM      Result Value Range   Lipase 11  11 - 59 U/L   Ct Abdomen Pelvis W Contrast  07/07/2012  *RADIOLOGY REPORT*  Clinical  Data: Abdominal pain and nausea.  CT ABDOMEN AND PELVIS WITH CONTRAST  Technique:  Multidetector CT imaging of the abdomen and pelvis was performed following the standard protocol during bolus administration of intravenous contrast.  Contrast: 50mL OMNIPAQUE IOHEXOL 300 MG/ML  SOLN, 80mL OMNIPAQUE IOHEXOL 300 MG/ML  SOLN  Comparison: CT of the abdomen and pelvis 04/17/2012.  Findings:  Lung Bases: There is fluid attenuation material filling many of the visualized left lower lobe bronchi with some associated atelectasis in the left base, and small left pleural effusion layering dependently.  A small amount of pericardial fluid and/or thickening, unlikely to be of hemodynamic significance at this time.  This is unchanged.  Abdomen/Pelvis:  Axial image 39 of series 2, and sagittal image 42 of series 5 demonstrates a perforation in the anterior wall of the stomach and the antral prepyloric region.  There is gas and oral contrast material extending outside the lumen of the stomach tracking cephalad underneath the left lobe of the liver.  There is also some fluid and free air immediately anterior to the liver, as well as avid peritoneal enhancement in this region.  Thickening of the gastric wall throughout the antrum is noted, likely inflammatory (less likely to represent a perforated gastric mass).  Minimal intrahepatic biliary ductal dilatation is noted.  Common bile duct is normal in caliber measuring only 5 mm in the porta hepatis.  The gallbladder  is unremarkable in appearance.  The appearance of the pancreas, spleen and bilateral adrenal glands is unremarkable.  There are three nonobstructive calculi in the right renal collecting system, largest of which measures up to 7 mm. Numerous low attenuation lesions are seen scattered throughout the kidneys bilaterally, the majority of which are too small to definitively characterize.  The largest of these lesions measures 3.3 cm in diameter in the posterior aspect of the  upper pole of the right kidney, and these larger lesions are all compatible with simple cysts.  Several borderline dilated loops of small bowel are seen measuring up to 2.9 cm in diameter.  Extensive atherosclerosis throughout the abdominal and pelvic vasculature, including high-grade stenosis of the distal abdominal aorta shortly above the bifurcation where the lumen narrows to approximately the 4 x 6 mm (image 36 of series 2). Status post hysterectomy.  Left ovary is not confidently identified and may be surgically absent or atrophic.  Right ovary is remarkable for a 2.6 x 2.0 cm well-defined low attenuation lesion, presumably ovarian cyst.  Urinary bladder is unremarkable in appearance.  Musculoskeletal: There are no aggressive appearing lytic or blastic lesions noted in the visualized portions of the skeleton.  IMPRESSION: 1.  Findings, as above, compatible with acute perforation of the antral prepyloric region of the stomach along the anterior wall, with a small amount of ascites, pneumoperitoneum and oral contrast material extravasated into the peritoneal cavity, as above. The antral region of the stomach is diffusely thickened, which may simply be related to ulcer disease, although the possibility of a perforated gastric mass warrants consideration. Surgical consultation is recommended. 2.  Fluid-filled bronchi in the left lower lobe with some associated atelectasis.  Findings may reflect sequela of recent aspiration. 3.  Multiple nonobstructive calculi in the right renal collecting system, largest of which measures up to 7 mm in diameter. 4.  Extensive atherosclerosis, including high-grade narrowing of the infrarenal abdominal aorta where the lumen measures only 6 x 4 mm. 5.  Numerous low-attenuation lesions in the kidneys bilaterally, largest of which are compatible with simple cysts, while the smaller lesions are too small to definitively characterize. 6.  Small amount of pericardial fluid and/or  thickening is unchanged, and unlikely to be of hemodynamic significance at this time.  These results were called by telephone on 07/07/2012 at 12:50 p.m. to Dr. Estell Harpin, who verbally acknowledged these results.   Original Report Authenticated By: Trudie Reed, M.D.    Dg Chest Port 1 View  07/07/2012  *RADIOLOGY REPORT*  Clinical Data: Shortness of breath  PORTABLE CHEST - 1 VIEW  Comparison: 04/17/2012  Findings: Chronic interstitial markings.  Small left and trace right pleural effusions.  Associated left lower lobe opacity, possibly atelectasis.  No pneumothorax.  The heart is top normal in size.  IMPRESSION: Small left and trace right pleural effusions, new.  Associated left lower lobe opacity, possibly atelectasis.   Original Report Authenticated By: Charline Bills, M.D.     Review of Systems  Constitutional: Positive for fever.  HENT: Negative.  Negative for neck pain.   Respiratory: Positive for cough, sputum production, shortness of breath and wheezing.   Cardiovascular: Negative.   Gastrointestinal: Positive for heartburn (on PPI), nausea, vomiting (not much vomiting, but ongoing nausea.) and abdominal pain (Mid epigastric). Negative for diarrhea and constipation.       No bm since 07/04/12.  Genitourinary:       Urgency, dribbling, and some pain with voiding.  Musculoskeletal: Negative for myalgias,  back pain and joint pain.       Fibromyalgia she says bother her chronically 18 sites  Skin: Negative.   Neurological: Negative.   Endo/Heme/Allergies: Negative.   Psychiatric/Behavioral: Positive for depression (ongoing depression).    Blood pressure 156/83, pulse 128, temperature 100.9 F (38.3 C), temperature source Oral, resp. rate 16, SpO2 77.00%. Physical Exam  Constitutional: She is oriented to person, place, and time. She appears distressed.  Thin cachectic female having allot of pain.   150/90 HR 120's Sat 92-96% on [place]  HENT:  Head: Normocephalic and atraumatic.   Nose: Nose normal.  Eyes: Conjunctivae and EOM are normal. Pupils are equal, round, and reactive to light. Right eye exhibits no discharge. No scleral icterus.  Neck: Normal range of motion. Neck supple. JVD present. No tracheal deviation present. No thyromegaly present.  Cardiovascular: Normal heart sounds and intact distal pulses.  Exam reveals no gallop.   No murmur heard. Tachycardic but regular.  Respiratory: Effort normal. No stridor. No respiratory distress. She has no wheezes. She has rales (rales and ronchi bilat.  coughing, dry right now.).  GI: She exhibits distension. She exhibits no mass. There is tenderness. There is guarding. There is no rebound.  Very tender midline epigastric area.  She isn't really distended, but she is tight and tender. BS hypoactive  Musculoskeletal: She exhibits no edema and no tenderness.  Lymphadenopathy:    She has no cervical adenopathy.  Neurological: She is alert and oriented to person, place, and time. No cranial nerve deficit.  Skin: Skin is warm and dry. No rash noted. She is not diaphoretic. No erythema.  Psychiatric: She has a normal mood and affect. Her behavior is normal. Judgment and thought content normal.     Assessment/Plan 1.Abdominal pain with gastric perforation on CT scan. Status post local repair of supraumbilical hernia and gastric wall biopsy 07/02/12. PaTHOLOGY Diagnosis: 1. Hernia sac - FINDINGS CONSISTENT WITH HERNIA SAC. 2. Stomach, biopsy - BENIGN GASTRIC TISSUE WITH HYPERTROPHIC MUSCULARIS PROPRIA - NO EVIDENCE OF MALIGNANCY.  2. Possible pneumonia/bronchitis.  40 year history of tobacco use. 3. Fibromyalgia with chronic back pain 4. GERD on PPI 5. Nephrolithiasis 6. Pericardial effusion in the past. 7. Multiple allergies 8. History depression  Plan: Patient's been seen and evaluated by Dr.Gerkin. Plan take to the operating room for support for laparotomy with further treatment as indicated this afternoon. I  started her on IV antibiotic, and PPI  Amberlee Garvey 07/07/2012, 2:06 PM

## 2012-07-07 NOTE — Anesthesia Preprocedure Evaluation (Addendum)
Anesthesia Evaluation  Patient identified by MRN, date of birth, ID band Patient awake    Reviewed: Allergy & Precautions, H&P , NPO status , Patient's Chart, lab work & pertinent test results  Airway Mallampati: II TM Distance: <3 FB Neck ROM: Full    Dental  (+) Missing and Dental Advisory Given   Pulmonary pneumonia -, resolved, COPD COPD inhaler, former smoker,  + rhonchi  Patient smells like smoke  + decreased breath sounds      Cardiovascular negative cardio ROS  Rhythm:Regular Rate:Normal  - Left ventricle: The cavity size was normal. Wall thickness  was normal. Systolic function was normal. The estimated  ejection fraction was in the range of 55% to 65%. Wall  motion was normal; there were no regional wall motion abnormalities. - Aortic valve: Trileaflet; mildly thickened leaflets. - Mitral valve: Mildly calcified annulus. Mildly thickened   leaflets . Trivial regurgitation. - Right ventricle: Systolic pressure was increased. - Tricuspid valve: Mild regurgitation. - Pulmonary arteries: PA peak pressure: 40mm Hg (S). - Pericardium, extracardiac: A small pericardial effusion   was identified anterior to the heart and at the apex   (maximal dimension 1.44 cm). There was no evidence of   hemodynamic compromise.  Impressions: - The right ventricular systolic pressure was increased   consistent with mild pulmonary hypertension.    Neuro/Psych  Headaches, PSYCHIATRIC DISORDERS Depression  Neuromuscular disease    GI/Hepatic Neg liver ROS, GERD-  Medicated,  Endo/Other  negative endocrine ROS  Renal/GU negative Renal ROS     Musculoskeletal  (+) Fibromyalgia -  Abdominal   Peds  Hematology negative hematology ROS (+)   Anesthesia Other Findings   Reproductive/Obstetrics negative OB ROS                        Anesthesia Physical  Anesthesia Plan  ASA: III and emergent  Anesthesia  Plan: General   Post-op Pain Management:    Induction: Intravenous  Airway Management Planned: Oral ETT  Additional Equipment:   Intra-op Plan:   Post-operative Plan: Possible Post-op intubation/ventilation  Informed Consent: I have reviewed the patients History and Physical, chart, labs and discussed the procedure including the risks, benefits and alternatives for the proposed anesthesia with the patient or authorized representative who has indicated his/her understanding and acceptance.   Dental advisory given  Plan Discussed with: CRNA and Surgeon  Anesthesia Plan Comments: (2 x PIV. +/- aline in OR if hemodynamically unstable.)      Anesthesia Quick Evaluation

## 2012-07-07 NOTE — ED Notes (Signed)
Per EMS: about 0100 pain increased in mid-abdomen and left side. Had hernia surgery(removal) on past Wednesday.

## 2012-07-07 NOTE — H&P (Signed)
Cynthia Bowen. [name], MD, [facility]  07/07/2012 4:11 PM

## 2012-07-07 NOTE — ED Provider Notes (Signed)
History     CSN: [id number]  Arrival date & time 07/07/12  4742   First MD Initiated Contact with Patient 07/07/12 [phone]      Chief Complaint  Patient presents with  . Abdominal Pain    (Consider location/radiation/quality/duration/timing/severity/associated sxs/prior treatment) Patient is a 67 y.o. female presenting with abdominal pain. The history is provided by the patient (the pt complains of abd pain which started yesterday.  pt had a gastric biopsy last week.). No language interpreter was used.  Abdominal Pain Pain location:  Epigastric Pain quality: sharp   Pain radiates to:  Epigastric region Pain severity:  Moderate Onset quality:  Sudden Timing:  Constant Progression:  Unchanged Chronicity:  New Associated symptoms: no chest pain, no cough, no diarrhea, no fatigue and no hematuria     Past Medical History  Diagnosis Date  . COPD (chronic obstructive pulmonary disease)   . Pneumonia 12-2011  . GERD (gastroesophageal reflux disease)   . Headache   . Arthritis     osteoarthritis  . Allergy   . Depression   . Neuromuscular disorder   . Osteoporosis   . Bronchitis     CURRENTLY AS OF 06/30/12 - HAS COUGH AND FINISHED ANTIBIOTIC FOR BRONCHITIS  . Fibromyalgia   . Pain     ABDOMINAL PAIN AND NAUSEA  . Pain     SOMETIMES PAIN RIGHT EAR AND NECK--STATES CAUSED BY A "LUMP" ON BACK OF EAR--USES KENALOG CREAM TOPICALLY AS NEEDED.    Past Surgical History  Procedure Laterality Date  . Abdominal hysterectomy    . Esophagogastroduodenoscopy  04/18/2012    Procedure: ESOPHAGOGASTRODUODENOSCOPY (EGD);  Surgeon: [name], MD;  Location: [facility];  Service: Endoscopy;  Laterality: N/A;  . Eus  05/29/2012    Procedure: UPPER ENDOSCOPIC ULTRASOUND (EUS) LINEAR;  Surgeon: [name], MD;  Location: [facility];  Service: Endoscopy;  Laterality: N/A;  . Appendectomy    . Spine surgery      CERVICAL SPINE SURGERY X 2 - INCLUDING FUSION; LUMBAR SURGERY FOR  RUPTURED DISC  . Eye surgery      BILATERAL CATARACT EXTRACTIONS  . Laparoscopic abdominal exploration N/A 07/02/2012    Procedure: converted to laparotomy with gastric biopsy;  Surgeon: [name]. [name], MD;  Location: [facility];  Service: General;  Laterality: N/A;  Laparoscopic Gastric Biospy attempted. converted to exploratory laparotomy with gastric wedge resection , open repair of umbilical hernia    Family History  Problem Relation Age of Onset  . COPD Mother   . Hypertension Maternal Grandmother     History  Substance Use Topics  . Smoking status: Former Smoker -- 0.50 packs/day for 35 years    Types: Cigarettes    Start date: 01/04/2003    Quit date: 04/17/2005  . Smokeless tobacco: Never Used  . Alcohol Use: No    OB History   Grav Para Term Preterm Abortions TAB SAB Ect Mult Living                  Review of Systems  Constitutional: Negative for fatigue.  HENT: Negative for congestion, sinus pressure and ear discharge.   Eyes: Negative for discharge.  Respiratory: Negative for cough.   Cardiovascular: Negative for chest pain.  Gastrointestinal: Positive for abdominal pain. Negative for diarrhea.  Genitourinary: Negative for frequency and hematuria.  Musculoskeletal: Negative for back pain.  Skin: Negative for rash.  Neurological: Negative for seizures and headaches.  Psychiatric/Behavioral: Negative for hallucinations.  Allergies  Avelox; Alendronate sodium; Aspirin; Codeine; Doxycycline; Fluconazole; Hydrocodone; Neurontin; Sertraline hcl; and Sulfa antibiotics  Home Medications   Current Outpatient Rx  Name  Route  Sig  Dispense  Refill  . albuterol (PROVENTIL HFA;VENTOLIN HFA) 108 (90 BASE) MCG/ACT inhaler   Inhalation   Inhale 2 puffs into the lungs every 4 (four) hours as needed for wheezing (cough, shortness of breath or wheezing.).   1 Inhaler   1   . clarithromycin (BIAXIN XL) 500 MG 24 hr tablet   Oral   Take 2 tablets (1,000 mg total) by  mouth daily.   20 tablet   0   . dextromethorphan-guaiFENesin (MUCINEX DM) 30-600 MG per 12 hr tablet   Oral   Take 1 tablet by mouth every 12 (twelve) hours.         . diphenhydrAMINE (BENADRYL) 25 mg capsule   Oral   Take 25-50 mg by mouth every 6 (six) hours as needed. Itching         . FLUoxetine (PROZAC) 20 MG capsule   Oral   Take 20 mg by mouth daily before breakfast.         . Multiple Vitamin (MULTIVITAMIN) tablet   Oral   Take 1 tablet by mouth daily.         . ondansetron (ZOFRAN-ODT) 8 MG disintegrating tablet   Oral   Take 1 tablet (8 mg total) by mouth every 8 (eight) hours as needed. For nausea   20 tablet   2   . oxyCODONE-acetaminophen (PERCOCET/ROXICET) 5-325 MG per tablet   Oral   Take 1 tablet by mouth every 4 (four) hours as needed for pain.   40 tablet   0   . pantoprazole (PROTONIX) 40 MG tablet   Oral   Take 40 mg by mouth daily.         . promethazine (PHENERGAN) 12.5 MG tablet   Oral   Take 1 tablet (12.5 mg total) by mouth every 6 (six) hours as needed for nausea.   30 tablet   0   . ranitidine (ZANTAC) 150 MG tablet   Oral   Take 150 mg by mouth 2 (two) times daily.         Marland Kitchen triamcinolone cream (KENALOG) 0.1 %   Topical   Apply 1 application topically as needed (as needed for right ear pain).         Marland Kitchen EPINEPHrine (EPIPEN) 0.3 mg/0.3 mL DEVI   Intramuscular   Inject 0.3 mLs (0.3 mg total) into the muscle once.   1 Device   2     BP 156/83  Pulse 128  Temp(Src) 100.9 F (38.3 C) (Oral)  Resp 16  SpO2 77%  Physical Exam  Constitutional: She is oriented to person, place, and time. She appears well-developed.  HENT:  Head: Normocephalic and atraumatic.  Eyes: Conjunctivae and EOM are normal. No scleral icterus.  Neck: Neck supple. No thyromegaly present.  Cardiovascular: Normal rate and regular rhythm.  Exam reveals no gallop and no friction rub.   No murmur heard. Pulmonary/Chest: No stridor. She has no  wheezes. She has no rales. She exhibits no tenderness.  Abdominal: She exhibits no distension. There is tenderness. There is no rebound.  Tender severe epigastric  Musculoskeletal: Normal range of motion. She exhibits no edema.  Lymphadenopathy:    She has no cervical adenopathy.  Neurological: She is oriented to person, place, and time. Coordination normal.  Skin: No rash noted. No  erythema.  Psychiatric: She has a normal mood and affect. Her behavior is normal.    ED Course  Procedures (including critical care time)  Labs Reviewed  CBC WITH DIFFERENTIAL - Abnormal; Notable for the following:    WBC 20.5 (*)    Neutrophils Relative 93 (*)    Neutro Abs 19.0 (*)    Lymphocytes Relative 2 (*)    Lymphs Abs 0.5 (*)    Monocytes Absolute 1.1 (*)    All other components within normal limits  COMPREHENSIVE METABOLIC PANEL - Abnormal; Notable for the following:    Sodium 132 (*)    Chloride 93 (*)    Glucose, Bld 122 (*)    Albumin 3.2 (*)    GFR calc non Af Amer 87 (*)    All other components within normal limits  LIPASE, BLOOD   Ct Abdomen Pelvis W Contrast  07/07/2012  *RADIOLOGY REPORT*  Clinical Data: Abdominal pain and nausea.  CT ABDOMEN AND PELVIS WITH CONTRAST  Technique:  Multidetector CT imaging of the abdomen and pelvis was performed following the standard protocol during bolus administration of intravenous contrast.  Contrast: 50mL OMNIPAQUE IOHEXOL 300 MG/ML  SOLN, 80mL OMNIPAQUE IOHEXOL 300 MG/ML  SOLN  Comparison: CT of the abdomen and pelvis 04/17/2012.  Findings:  Lung Bases: There is fluid attenuation material filling many of the visualized left lower lobe bronchi with some associated atelectasis in the left base, and small left pleural effusion layering dependently.  A small amount of pericardial fluid and/or thickening, unlikely to be of hemodynamic significance at this time.  This is unchanged.  Abdomen/Pelvis:  Axial image 39 of series 2, and sagittal image 42 of  series 5 demonstrates a perforation in the anterior wall of the stomach and the antral prepyloric region.  There is gas and oral contrast material extending outside the lumen of the stomach tracking cephalad underneath the left lobe of the liver.  There is also some fluid and free air immediately anterior to the liver, as well as avid peritoneal enhancement in this region.  Thickening of the gastric wall throughout the antrum is noted, likely inflammatory (less likely to represent a perforated gastric mass).  Minimal intrahepatic biliary ductal dilatation is noted.  Common bile duct is normal in caliber measuring only 5 mm in the porta hepatis.  The gallbladder is unremarkable in appearance.  The appearance of the pancreas, spleen and bilateral adrenal glands is unremarkable.  There are three nonobstructive calculi in the right renal collecting system, largest of which measures up to 7 mm. Numerous low attenuation lesions are seen scattered throughout the kidneys bilaterally, the majority of which are too small to definitively characterize.  The largest of these lesions measures 3.3 cm in diameter in the posterior aspect of the upper pole of the right kidney, and these larger lesions are all compatible with simple cysts.  Several borderline dilated loops of small bowel are seen measuring up to 2.9 cm in diameter.  Extensive atherosclerosis throughout the abdominal and pelvic vasculature, including high-grade stenosis of the distal abdominal aorta shortly above the bifurcation where the lumen narrows to approximately the 4 x 6 mm (image 36 of series 2). Status post hysterectomy.  Left ovary is not confidently identified and may be surgically absent or atrophic.  Right ovary is remarkable for a 2.6 x 2.0 cm well-defined low attenuation lesion, presumably ovarian cyst.  Urinary bladder is unremarkable in appearance.  Musculoskeletal: There are no aggressive appearing lytic or blastic lesions  noted in the visualized  portions of the skeleton.  IMPRESSION: 1.  Findings, as above, compatible with acute perforation of the antral prepyloric region of the stomach along the anterior wall, with a small amount of ascites, pneumoperitoneum and oral contrast material extravasated into the peritoneal cavity, as above. The antral region of the stomach is diffusely thickened, which may simply be related to ulcer disease, although the possibility of a perforated gastric mass warrants consideration. Surgical consultation is recommended. 2.  Fluid-filled bronchi in the left lower lobe with some associated atelectasis.  Findings may reflect sequela of recent aspiration. 3.  Multiple nonobstructive calculi in the right renal collecting system, largest of which measures up to 7 mm in diameter. 4.  Extensive atherosclerosis, including high-grade narrowing of the infrarenal abdominal aorta where the lumen measures only 6 x 4 mm. 5.  Numerous low-attenuation lesions in the kidneys bilaterally, largest of which are compatible with simple cysts, while the smaller lesions are too small to definitively characterize. 6.  Small amount of pericardial fluid and/or thickening is unchanged, and unlikely to be of hemodynamic significance at this time.  These results were called by telephone on 07/07/2012 at 12:50 p.m. to Dr. Estell Harpin, who verbally acknowledged these results.   Original Report Authenticated By: Trudie Reed, M.D.      1. Abdominal pain    CRITICAL CARE Performed by: Rubin Dais L   Total critical care time: 35 min  Critical care time was exclusive of separately billable procedures and treating other patients.  Critical care was necessary to treat or prevent imminent or life-threatening deterioration.  Critical care was time spent personally by me on the following activities: development of treatment plan with patient and/or surrogate as well as nursing, discussions with consultants, evaluation of patient's response to  treatment, examination of patient, obtaining history from patient or surrogate, ordering and performing treatments and interventions, ordering and review of laboratory studies, ordering and review of radiographic studies, pulse oximetry and re-evaluation of patient's condition.    MDM  Surgery to admit        Benny Lennert, MD 07/07/12 1309

## 2012-07-07 NOTE — ED Notes (Signed)
ZOX:[id number]<AV> Expected date:<BR> Expected time:<BR> Means of arrival:<BR> Comments:<BR> 67 yo abd pain, hernia surgery last wk

## 2012-07-07 NOTE — H&P (Signed)
Return to OR today for exploratory laparotomy/ repair of gastric leak  [name]. [name], MD, [facility] Surgery  07/07/2012 3:17 PM

## 2012-07-07 NOTE — Op Note (Signed)
Preop diagnosis: Gastric perforation with peritonitis Postop diagnosis: Same Procedure: Exploratory laparotomy, drainage of peritoneal abscess, omental patch of gastric perforation Surgeon:Rishab Stoudt K. Anesthesia: Dr. Viviann Spare gross Indications: This is a 67 year old female who is one-week status post open repair of a small ventral hernia and gastric wall biopsy who presented today with peritonitis, acute abdomen, and CT evidence of a leak at the biopsy site. She is brought to the operating room emergently for surgical treatment.  Description of procedure: The patient brought to the operating room and placed in a supine position on the operating room table. After an adequate level of general anesthesia was obtained, the patient's abdomen was prepped with chlor prep and draped in sterile fashion. A Foley catheter had been placed and she was given preoperative antibiotics. A timeout was taken to ensure the proper patient proper procedure. We open her previous midline incision and extended this superiorly and inferiorly. The fascial sutures were removed. We entered the peritoneal cavity bluntly. In the left upper quadrant large pocket of purulent fluid was encountered and suctioned out. We placed a Balfour retractor and thoroughly irrigated the abdomen. The previous biopsy site was examined. This had previously been closed with a double layer of  Silk sutures. There appeared to be a microscopic perforation in the Center for closure. The surrounding stomach appeared to be intact. A nasogastric tube was passed by anesthesia and was palpated with its tip in the stomach proximal to our closure site. We thoroughly irrigated the abdomen again and suctioned out much grossly purulent fluid as possible. Once her irrigation was clear we performed our mental repair. We mobilized some omentum from the transverse colon by dividing some of the larger omental vessels with 2-0 silk sutures. A tongue of omentum was placed over  the gastric biopsy site and secured with 3 interrupted 2-0 silk sutures. This held the omental patch in place with minimal tension. We again irrigated. 63 French drain was brought out through a stab incision and placed near the antrum under the liver. This secured with a 3-0 nylon suture. The fascia was reapproximated with interrupted figure-of-eight #1 Novafil sutures. The subcutaneous tissues were irrigated. Staples were used to close the skin around the umbilicus. The remainder the wound was packed with saline soaked gauze. Dry dressing was applied. The patient was then extubated and brought to recovery room in stable condition. All sponge, instrument, and needle counts are correct.  [name]. [name], MD, [facility] Surgery  07/07/2012 6:52 PM

## 2012-07-07 NOTE — ED Notes (Signed)
Diane,RN CN notified of low o2 sat

## 2012-07-07 NOTE — Preoperative (Signed)
Beta Blockers   Reason not to administer Beta Blockers:Not Applicable, not on home BB 

## 2012-07-08 LAB — BASIC METABOLIC PANEL
BUN: 13 mg/dL (ref 6–23)
Chloride: 100 mEq/L (ref 96–112)
Creatinine, Ser: 0.68 mg/dL (ref 0.50–1.10)
GFR calc Af Amer: >90 mL/min (ref >90)
GFR calc non Af Amer: 90 mL/min — ABNORMAL LOW (ref >90)
Glucose, Bld: 112 mg/dL — ABNORMAL HIGH (ref 70–99)
Potassium: 4 mEq/L (ref 3.5–5.1)

## 2012-07-08 LAB — CBC
HCT: 30.8 % — ABNORMAL LOW (ref 36.0–46.0)
Hemoglobin: 10.5 g/dL — ABNORMAL LOW (ref 12.0–15.0)
MCHC: 34.1 g/dL (ref 30.0–36.0)
MCV: 84.8 fL (ref 78.0–100.0)
RDW: 15.3 % (ref 11.5–15.5)

## 2012-07-08 MED ORDER — PHENOL 1.4 % MT LIQD
1.0000 | OROMUCOSAL | Status: DC | PRN
  Filled 2012-07-08: qty 177

## 2012-07-08 MED ORDER — VANCOMYCIN HCL 1000 MG IV SOLR
750.0000 mg | INTRAVENOUS | Status: DC
  Administered 2012-07-09 – 2012-07-10 (×2): 750 mg via INTRAVENOUS
  Filled 2012-07-08 (×3): qty 750

## 2012-07-08 MED ORDER — VANCOMYCIN HCL IN DEXTROSE 1-5 GM/200ML-% IV SOLN
1000.0000 mg | Freq: Once | INTRAVENOUS | Status: AC
  Administered 2012-07-08: 1000 mg via INTRAVENOUS
  Filled 2012-07-08: qty 200

## 2012-07-08 NOTE — Progress Notes (Signed)
INITIAL NUTRITION ASSESSMENT  Pt meets criteria for severe MALNUTRITION in the context of chronic illness as evidenced by 16.5% weight loss in the past 6 months in addition to pt with visible severe muscle wasting and subcutaneous fat loss in patellar region, anterior thigh region, and posterior calf region.  DOCUMENTATION CODES Per approved criteria  -Severe malnutrition in the context of chronic illness -Underweight   INTERVENTION: - Diet advancement per MD - Will continue to monitor   NUTRITION DIAGNOSIS: Inadequate oral intake related to inability to eat as evidenced by NPO.   Goal: Advance diet as tolerated to low fiber diet.   Monitor:  Weights, labs, diet advancement, NGT output  Reason for Assessment: Underweight  67 y.o. female  Admitting Dx: Abdominal pain  ASSESSMENT: Pt known to RD from admission earlier this month. 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 POD# 1 exploratory laparotomy with repair of gastric perforation with omental graham patch with drainage of abdominal abscess. Pt reports poor intake for the past 3 days with pt only consuming soups, jello, juice, and soda. Pt reports before then she was eating 2 well balanced meals/day and drinking 1 Boost/day. Pt with 19 pound unintended weight loss in the past 6 months.  Pt visibly malnourished. Performed nutrition focused physical exam. Pt with NGT in place with green output total yesterday.  Nutrition Focused Physical Exam:   Subcutaneous Fat:  Orbital Region: mild/moderate wasting  Upper Arm Region: mild/moderate wasting  Thoracic and Lumbar Region: N/A   Muscle:  Temple Region: mild/moderate wasting  Clavicle Bone Region: mild/moderate wasting  Clavicle and Acromion Bone Region: mild/moderate wasting  Scapular Bone Region: N/A  Dorsal Hand: mild/moderate wasting  Patellar Region: severe wasting  Anterior Thigh Region: severe wasting  Posterior Calf  Region: severe wasting   Edema: None observed   Height: Ht Readings from Last 1 Encounters:  07/07/12 5' 4.96" (1.65 m)    Weight: Wt Readings from Last 1 Encounters:  07/07/12 96 lb 5.5 oz (43.7 kg)   Ideal Body Weight: 125 lb   % Ideal Body Weight: 77   Wt Readings from Last 10 Encounters:  07/07/12 96 lb 5.5 oz (43.7 kg)  07/07/12 96 lb 5.5 oz (43.7 kg)  07/02/12 96 lb 5 oz (43.687 kg)  07/02/12 96 lb 5 oz (43.687 kg)  07/02/12 96 lb 5 oz (43.687 kg)  06/30/12 96 lb 3.2 oz (43.636 kg)  06/30/12 98 lb 12.8 oz (44.815 kg)  06/24/12 98 lb 3.2 oz (44.543 kg)  06/19/12 96 lb 6.4 oz (43.727 kg)  06/11/12 94 lb (42.638 kg)    Usual Body Weight: 115 lb  % Usual Body Weight: 83  BMI:  Body mass index is 16.05 kg/(m^2).  Estimated Nutritional Needs: Kcal: 1400-1750  Protein: 65-85g  Fluid: 1.4-1.7L/day   Skin: Abdomen incision   Diet Order: NPO  EDUCATION NEEDS: -No education needs identified at this time   Intake/Output Summary (Last 24 hours) at 07/08/12 1107 Last data filed at 07/08/12 0623  Gross per 24 hour  Intake 2206.67 ml  Output   1635 ml  Net 571.67 ml    Last BM: 3/17  Labs:   Recent Labs Lab 07/03/12 0400 07/07/12 1001 07/08/12 0419  NA 136 132* 136  K 4.4 4.0 4.0  CL 101 93* 100  CO2 27 28 26   BUN 11 12 13   CREATININE 0.71 0.75 0.68  CALCIUM 9.0 9.5 8.5  GLUCOSE 124* 122*  112*    CBG (last 3)  No results found for this basename: GLUCAP,  in the last 72 hours  Scheduled Meds: . enoxaparin (LOVENOX) injection  40 mg Subcutaneous Q24H  . HYDROmorphone PCA 0.3 mg/mL   Intravenous Q4H  . pantoprazole (PROTONIX) IV  40 mg Intravenous Q12H  . piperacillin-tazobactam (ZOSYN)  IV  3.375 g Intravenous Q8H  . [START ON 07/09/2012] vancomycin  750 mg Intravenous Q24H  . vancomycin  1,000 mg Intravenous Once  . vancomycin  1,000 mg Intravenous Once    Continuous Infusions: . 0.9 % NaCl with KCl 20 mEq / L 100 mL/hr at 07/08/12  1610    Past Medical History  Diagnosis Date  . COPD (chronic obstructive pulmonary disease)   . Pneumonia 12-2011  . GERD (gastroesophageal reflux disease)   . Headache   . Arthritis     osteoarthritis  . Allergy   . Depression   . Neuromuscular disorder   . Osteoporosis   . Bronchitis     CURRENTLY AS OF 06/30/12 - HAS COUGH AND FINISHED ANTIBIOTIC FOR BRONCHITIS  . Fibromyalgia   . Pain     ABDOMINAL PAIN AND NAUSEA  . Pain     SOMETIMES PAIN RIGHT EAR AND NECK--STATES CAUSED BY A "LUMP" ON BACK OF EAR--USES KENALOG CREAM TOPICALLY AS NEEDED.    Past Surgical History  Procedure Laterality Date  . Abdominal hysterectomy    . Esophagogastroduodenoscopy  04/18/2012    Procedure: ESOPHAGOGASTRODUODENOSCOPY (EGD);  Surgeon: [name], MD;  Location: [facility];  Service: Endoscopy;  Laterality: N/A;  . Eus  05/29/2012    Procedure: UPPER ENDOSCOPIC ULTRASOUND (EUS) LINEAR;  Surgeon: [name], MD;  Location: [facility];  Service: Endoscopy;  Laterality: N/A;  . Appendectomy    . Spine surgery      CERVICAL SPINE SURGERY X 2 - INCLUDING FUSION; LUMBAR SURGERY FOR RUPTURED DISC  . Eye surgery      BILATERAL CATARACT EXTRACTIONS  . Laparoscopic abdominal exploration N/A 07/02/2012    Procedure: converted to laparotomy with gastric biopsy;  Surgeon: [name]. [name], MD;  Location: [facility];  Service: General;  Laterality: N/A;  Laparoscopic Gastric Biospy attempted. converted to exploratory laparotomy with gastric wedge resection , open repair of umbilical hernia     Levon Hedger MS, RD, LDN [phone] Pager [phone] After Hours Pager

## 2012-07-08 NOTE — Progress Notes (Signed)
1 Day Post-Op  Subjective: PCA adequately controlling pain Wants to get up out of bed Foley removed - has not voided yet  Objective: Vital signs in last 24 hours: Temp:  [97.5 F (36.4 C)-100.9 F (38.3 C)] 97.6 F (36.4 C) (03/18 0600) Pulse Rate:  [81-128] 84 (03/18 0600) Resp:  [9-25] 16 (03/18 0600) BP: (100-166)/(67-99) 100/67 mmHg (03/18 0600) SpO2:  [77 %-100 %] 99 % (03/18 0600) Weight:  [96 lb 5.5 oz (43.7 kg)] 96 lb 5.5 oz (43.7 kg) (03/17 2000) Last BM Date: 07/07/12  Intake/Output from previous day: 03/17 0701 - 03/18 0700 In: 2206.7 [I.V.:2206.7] Out: 1635 [Urine:1250; Emesis/NG output:135; Drains:200; Blood:50] Intake/Output this shift:    General appearance: alert, cooperative and no distress GI: soft, quiet, incisional tenderness wound - clean; minimal drainage  Lab Results:   Recent Labs  07/07/12 1001 07/08/12 0419  WBC 20.5* 15.9*  HGB 12.6 10.5*  HCT 36.2 30.8*  PLT 343 320   BMET  Recent Labs  07/07/12 1001 07/08/12 0419  NA 132* 136  K 4.0 4.0  CL 93* 100  CO2 28 26  GLUCOSE 122* 112*  BUN 12 13  CREATININE 0.75 0.68  CALCIUM 9.5 8.5   PT/INR No results found for this basename: LABPROT, INR,  in the last 72 hours ABG No results found for this basename: PHART, PCO2, PO2, HCO3,  in the last 72 hours  Studies/Results: Ct Abdomen Pelvis W Contrast  07/07/2012  *RADIOLOGY REPORT*  Clinical Data: Abdominal pain and nausea.  CT ABDOMEN AND PELVIS WITH CONTRAST  Technique:  Multidetector CT imaging of the abdomen and pelvis was performed following the standard protocol during bolus administration of intravenous contrast.  Contrast: 50mL OMNIPAQUE IOHEXOL 300 MG/ML  SOLN, 80mL OMNIPAQUE IOHEXOL 300 MG/ML  SOLN  Comparison: CT of the abdomen and pelvis 04/17/2012.  Findings:  Lung Bases: There is fluid attenuation material filling many of the visualized left lower lobe bronchi with some associated atelectasis in the left base, and small left  pleural effusion layering dependently.  A small amount of pericardial fluid and/or thickening, unlikely to be of hemodynamic significance at this time.  This is unchanged.  Abdomen/Pelvis:  Axial image 39 of series 2, and sagittal image 42 of series 5 demonstrates a perforation in the anterior wall of the stomach and the antral prepyloric region.  There is gas and oral contrast material extending outside the lumen of the stomach tracking cephalad underneath the left lobe of the liver.  There is also some fluid and free air immediately anterior to the liver, as well as avid peritoneal enhancement in this region.  Thickening of the gastric wall throughout the antrum is noted, likely inflammatory (less likely to represent a perforated gastric mass).  Minimal intrahepatic biliary ductal dilatation is noted.  Common bile duct is normal in caliber measuring only 5 mm in the porta hepatis.  The gallbladder is unremarkable in appearance.  The appearance of the pancreas, spleen and bilateral adrenal glands is unremarkable.  There are three nonobstructive calculi in the right renal collecting system, largest of which measures up to 7 mm. Numerous low attenuation lesions are seen scattered throughout the kidneys bilaterally, the majority of which are too small to definitively characterize.  The largest of these lesions measures 3.3 cm in diameter in the posterior aspect of the upper pole of the right kidney, and these larger lesions are all compatible with simple cysts.  Several borderline dilated loops of small bowel are seen  measuring up to 2.9 cm in diameter.  Extensive atherosclerosis throughout the abdominal and pelvic vasculature, including high-grade stenosis of the distal abdominal aorta shortly above the bifurcation where the lumen narrows to approximately the 4 x 6 mm (image 36 of series 2). Status post hysterectomy.  Left ovary is not confidently identified and may be surgically absent or atrophic.  Right ovary is  remarkable for a 2.6 x 2.0 cm well-defined low attenuation lesion, presumably ovarian cyst.  Urinary bladder is unremarkable in appearance.  Musculoskeletal: There are no aggressive appearing lytic or blastic lesions noted in the visualized portions of the skeleton.  IMPRESSION: 1.  Findings, as above, compatible with acute perforation of the antral prepyloric region of the stomach along the anterior wall, with a small amount of ascites, pneumoperitoneum and oral contrast material extravasated into the peritoneal cavity, as above. The antral region of the stomach is diffusely thickened, which may simply be related to ulcer disease, although the possibility of a perforated gastric mass warrants consideration. Surgical consultation is recommended. 2.  Fluid-filled bronchi in the left lower lobe with some associated atelectasis.  Findings may reflect sequela of recent aspiration. 3.  Multiple nonobstructive calculi in the right renal collecting system, largest of which measures up to 7 mm in diameter. 4.  Extensive atherosclerosis, including high-grade narrowing of the infrarenal abdominal aorta where the lumen measures only 6 x 4 mm. 5.  Numerous low-attenuation lesions in the kidneys bilaterally, largest of which are compatible with simple cysts, while the smaller lesions are too small to definitively characterize. 6.  Small amount of pericardial fluid and/or thickening is unchanged, and unlikely to be of hemodynamic significance at this time.  These results were called by telephone on 07/07/2012 at 12:50 p.m. to Dr. Estell Harpin, who verbally acknowledged these results.   Original Report Authenticated By: Trudie Reed, M.D.    Dg Chest Port 1 View  07/07/2012  *RADIOLOGY REPORT*  Clinical Data: Shortness of breath  PORTABLE CHEST - 1 VIEW  Comparison: 04/17/2012  Findings: Chronic interstitial markings.  Small left and trace right pleural effusions.  Associated left lower lobe opacity, possibly atelectasis.  No  pneumothorax.  The heart is top normal in size.  IMPRESSION: Small left and trace right pleural effusions, new.  Associated left lower lobe opacity, possibly atelectasis.   Original Report Authenticated By: Charline Bills, M.D.     Anti-infectives: Anti-infectives   Start     Dose/Rate Route Frequency Ordered Stop   07/08/12 1600  vancomycin (VANCOCIN) 750 mg in sodium chloride 0.9 % 150 mL IVPB     750 mg 150 mL/hr over 60 Minutes Intravenous Every 24 hours 07/07/12 1455     07/08/12 0200  piperacillin-tazobactam (ZOSYN) IVPB 3.375 g     3.375 g 12.5 mL/hr over 240 Minutes Intravenous Every 8 hours 07/07/12 1722     07/07/12 1730  [MAR Hold]  piperacillin-tazobactam (ZOSYN) IVPB 3.375 g     (On MAR Hold since 07/07/12 1724)   3.375 g 100 mL/hr over 30 Minutes Intravenous To Surgery 07/07/12 1722 07/07/12 1737   07/07/12 1430  piperacillin-tazobactam (ZOSYN) IVPB 3.375 g  Status:  Discontinued     3.375 g 12.5 mL/hr over 240 Minutes Intravenous Every 8 hours 07/07/12 1404 07/07/12 1722   07/07/12 1430  vancomycin (VANCOCIN) IVPB 1000 mg/200 mL premix     1,000 mg 200 mL/hr over 60 Minutes Intravenous  Once 07/07/12 1410        Assessment/Plan: s/p Procedure(s): EXPLORATORY  LAPAROTOMY repair of gastric perforation with omental graham patch, drainage of abdominal abcess (N/A) Ice chips only NG tube OOB to chair Continue antibiotics   LOS: 1 day    Micha Dosanjh K. 07/08/2012

## 2012-07-08 NOTE — Progress Notes (Signed)
ANTIBIOTIC CONSULT NOTE - Follow Up  Pharmacy Consult for vancomycin Indication: suspected pneumonia  Allergies  Allergen Reactions  . Avelox (Moxifloxacin Hcl In Nacl) Nausea And Vomiting  . Alendronate Sodium Nausea And Vomiting  . Aspirin Nausea Only  . Codeine Nausea And Vomiting  . Doxycycline   . Fluconazole   . Hydrocodone Nausea And Vomiting    GI distress  . Neurontin (Gabapentin) Other (See Comments)    Mood changes   . Sertraline Hcl Other (See Comments)    Hallucinations   . Sulfa Antibiotics Rash    Patient Measurements: Height: 5' 4.96" (165 cm) (Documented 07/02/12) Weight: 96 lb 5.5 oz (43.7 kg) (Documented 07/02/12) IBW/kg (Calculated) : 56.91 From 07/02/2012: Height 65 inches Weight 43.7 kg  Vital Signs: Temp: 97.6 Bowen (36.4 C) (03/18 0600) Temp src: Oral (03/18 0600) BP: 100/67 mmHg (03/18 0600) Pulse Rate: 84 (03/18 0600) Intake/Output from previous day: 03/17 0701 - 03/18 0700 In: 2206.7 [I.V.:2206.7] Out: 1635 [Urine:1250; Emesis/NG output:135; Drains:200; Blood:50] Intake/Output from this shift:    Labs:  Recent Labs  07/07/12 1001 07/08/12 0419  WBC 20.5* 15.9*  HGB 12.6 10.5*  PLT 343 320  CREATININE 0.75 0.68   Estimated Creatinine Clearance: 48.4 ml/min (by C-G formula based on Cr of 0.68).   Microbiology: Recent Results (from the past 720 hour(s))  SURGICAL PCR SCREEN     Status: None   Collection Time    06/30/12  1:05 PM      Result Value Range Status   MRSA, PCR NEGATIVE  NEGATIVE Final   Staphylococcus aureus NEGATIVE  NEGATIVE Final   Comment:            The Xpert SA Assay (FDA     approved for NASAL specimens     in patients over 44 years of age),     is one component of     a comprehensive surveillance     program.  Test performance has     been validated by [employer] for patients greater     than or equal to 56 year old.     It is not intended     to diagnose infection nor to     guide or monitor  treatment.    Medical History: Past Medical History  Diagnosis Date  . COPD (chronic obstructive pulmonary disease)   . Pneumonia 12-2011  . GERD (gastroesophageal reflux disease)   . Headache   . Arthritis     osteoarthritis  . Allergy   . Depression   . Neuromuscular disorder   . Osteoporosis   . Bronchitis     CURRENTLY AS OF 06/30/12 - HAS COUGH AND FINISHED ANTIBIOTIC FOR BRONCHITIS  . Fibromyalgia   . Pain     ABDOMINAL PAIN AND NAUSEA  . Pain     SOMETIMES PAIN RIGHT EAR AND NECK--STATES CAUSED BY A "LUMP" ON BACK OF EAR--USES KENALOG CREAM TOPICALLY AS NEEDED.   Assessment: 67 y/o Bowen recently hospitalized 3/12-3/14 for supraumbilical hernia repair and gastric biopsy, presented with persistent cough, low-grade fever, and leukocytosis following completion of oral antibiotic course for bronchitis.  CXR showed LLL opacity.  Pharmacy assistance was requested for antibiotic dosing of vancomycin and Zosyn for suspected pneumonia.  Vancomycin 1g IV loading dose ordered yesterday but was never charted in [facility].  Assuming that vancomycin dose was missed when patient was transferred from ED to OR so will reload the patient today with 1g now.  Pt did receive Zosyn dose prior to procedure yesterday (ex lap repair of gastric perf and drainage of abdominal abscess).    Goal Range:  Vancomycin trough 15-20  Plan:  1. Vancomycin 1000 mg IV x 1, then 750 mg IV q24h. 2. Follow serum creatinine 3. Check vancomycin trough at steady-state 4. Continue Zosyn 3.375 grams IV q8h (extended-infusion).  Clance Boll, PharmD, BCPS Pager: [phone] 07/08/2012 8:26 AM

## 2012-07-08 NOTE — Care Management Note (Addendum)
    Page 1 of 2   07/24/2012     1:11:27 PM   CARE MANAGEMENT NOTE 07/24/2012  Patient:  Cynthia Bowen, Cynthia Bowen   Account Number:  [id number]  Date Initiated:  07/08/2012  Documentation initiated by:  Lorenda Ishihara  Subjective/Objective Assessment:   67 yo female admitted s/p exploratory lap, omental patch for perf. PTA lived at home with family.     Action/Plan:   Home when stable   Anticipated DC Date:  07/25/2012   Anticipated DC Plan:  HOME W HOME HEALTH SERVICES      DC Planning Services  CM consult      [facility] Choice  HOME HEALTH   Choice offered to / List presented to:  [name]   DME arranged  [facility] Hurst      DME agency  [facility]     [facility] arranged  HH-1 RN  HH-2 PT      [facility] agency  [facility]   Status of service:  Completed, signed off Medicare Important Message given?   (If response is "NO", the following Medicare IM given date fields will be blank) Date Medicare IM given:   Date Additional Medicare IM given:    Discharge Disposition:  HOME W HOME HEALTH SERVICES  Per UR Regulation:  Reviewed for med. necessity/level of care/duration of stay  If discussed at Long Length of Stay Meetings, dates discussed:    Comments:  07-24-12 Lorenda Ishihara RN CM 1300 Plan for d/c home with TNA, [facility] to provide [facility] service. [facility] has done teaching with patient and nephew and they are comfortable with managing TNA with [facility] support. Patient declines aide at this time.  07/11/12 KATHY MAHABIR RN,BSN NCM [phone] [facility] CHOSEN FOR HHPT,SPOKE TO [facility](REP),& RW NEEDED. POD#4 EXP LAP,REPAIR OF GASTRIC PERFORATION,DRAINAGE OF ABSCESS.IMPROVING.PT-HH,& RW.AWAIT OT RECOMMENDATIONS.  07/10/12 KATHY MAHABIR RN,BSN NCM [phone] F/U ON HHC AGENCY LIST GIVEN.PATIENT WANTED [facility],EXPLAINED THAT THEY CONTRACT THEIR SKILLED CARE TO [facility].PATIENT WILL DISCUSS W/FAMILY.NOTED NGT D/C.  07-09-12 Lorenda Ishihara RN CM 1400 Spoke with patient at bedside  regarding need for [facility] RN for wound care. Provided patient with list of [facility] agencies for choice. Will f/u with patient tomorrow, she wants to discuss with family prior to making a decision.

## 2012-07-08 NOTE — Clinical Documentation Improvement (Signed)
MALNUTRITION DOCUMENTATION CLARIFICATION  THIS DOCUMENT IS NOT A PERMANENT PART OF THE MEDICAL RECORD  TO RESPOND TO THE THIS QUERY, FOLLOW THE INSTRUCTIONS BELOW:  1. If needed, update documentation for the patient's encounter via the notes activity.  2. Access this query again and click edit on the In [employer].  3. After updating, or not, click F2 to complete all highlighted (required) fields concerning your review. Select "additional documentation in the medical record" OR "no additional documentation provided".  4. Click Sign note button.  5. The deficiency will fall out of your In Basket *Please let us know if you are not able to complete this workflow by phone or e-mail (listed below).  Please update your documentation within the medical record to reflect your response to this query.                                                                                        07/08/12   Dear Dr. [name]  / Associates,  In a better effort to capture your patient's severity of illness, reflect appropriate length of stay and utilization of resources, a review of the patient medical record has revealed the following indicators.    Based on your clinical judgment, please clarify and document in a progress note and/or discharge summary the clinical condition associated with the following supporting information:  In responding to this query please exercise your independent judgment.  The fact that a query is asked, does not imply that any particular answer is desired or expected.  According to  nutrition consult note on 07/08/2012   patient meets criteria for "severe malnutrition in the context of chronic illness"   If this is an appropriate diagnosis please document, if not please clarify the malnutrition status of patient if known. Thank you.   Possible Clinical Conditions? _______Severe Malnutrition     _______Severe Protein Calorie Malnutrition   _______Other  Condition________________ _______Cannot clinically determine     Supporting Information: Risk Factors:  Gastric perforation, COPD,  GERD, Depression,   Signs & Symptoms: -Ht:38ft 4in       Wt: 96 lbs  -BMI:16.05   -Weight  Loss:   Pt with 19 pound unintended weight loss in the past 6 months.  Pt visibly malnourished. Performed nutrition focused physical exam. Pt with NGT in place   -Calcium level:9.0 Subcutaneous Fat:  Orbital Region: mild/moderate wasting   Upper Arm Region: mild/moderate wasting   Thoracic and Lumbar Region: N/A   Muscle:  Temple Region: mild/moderate wasting   Clavicle Bone Region: mild/moderate wasting   Clavicle and Acromion Bone Region: mild/moderate wasting   Scapular Bone Region: N/A   Dorsal Hand: mild/moderate wasting   Patellar Region: severe wasting   Anterior Thigh Region: severe wasting   Posterior Calf Region: severe wasting  Edema: None observed  Treatments:INTERVENTION: - Diet advancement per MD - Will continue to monitor   -Medications:PROTONIX IV                       0.9 % NaCl with KCl 20 mEq / L  100 mL/hr   -Nutrition Consult:    07/08/2012  Pt meets criteria for severe MALNUTRITION in the context of chronic illness as evidenced by 16.5% weight loss in the past 6 months in addition to pt with visible severe muscle wasting and subcutaneous fat loss in patellar region, anterior thigh region, and posterior calf region   You may use possible, probable, or suspect with inpatient documentation. possible, probable, suspected diagnoses MUST be documented at the time of discharge  Reviewed: additional documentation in the medical record  Thank You,  Andy Gauss RN, BSN  Clinical Documentation Specialist:  Pager [phone] Office [phone] [facility] Health Information Management [place]

## 2012-07-09 ENCOUNTER — Encounter (HOSPITAL_COMMUNITY): Payer: Self-pay | Admitting: Surgery

## 2012-07-09 DIAGNOSIS — E43 Unspecified severe protein-calorie malnutrition: Secondary | ICD-10-CM

## 2012-07-09 LAB — BASIC METABOLIC PANEL
BUN: 19 mg/dL (ref 6–23)
BUN: 18 mg/dL (ref 6–23)
CO2: 24 mEq/L (ref 19–32)
Chloride: 101 mEq/L (ref 96–112)
Chloride: 104 mEq/L (ref 96–112)
Creatinine, Ser: 0.72 mg/dL (ref 0.50–1.10)
Creatinine, Ser: 0.72 mg/dL (ref 0.50–1.10)
GFR calc Af Amer: >90 mL/min (ref >90)
Glucose, Bld: 79 mg/dL (ref 70–99)

## 2012-07-09 LAB — CBC
HCT: 32.5 % — ABNORMAL LOW (ref 36.0–46.0)
MCV: 84.4 fL (ref 78.0–100.0)
RBC: 3.85 MIL/uL — ABNORMAL LOW (ref 3.87–5.11)
WBC: 19.9 10*3/uL — ABNORMAL HIGH (ref 4.0–10.5)

## 2012-07-09 MED ORDER — ENOXAPARIN SODIUM 40 MG/0.4ML ~~LOC~~ SOLN
40.0000 mg | SUBCUTANEOUS | Status: DC
  Administered 2012-07-10 – 2012-07-17 (×7): 40 mg via SUBCUTANEOUS
  Filled 2012-07-09 (×10): qty 0.4

## 2012-07-09 MED ORDER — LORAZEPAM 2 MG/ML IJ SOLN
0.5000 mg | Freq: Four times a day (QID) | INTRAMUSCULAR | Status: DC | PRN
  Administered 2012-07-09: 0.5 mg via INTRAVENOUS
  Administered 2012-07-10 – 2012-07-15 (×2): 1 mg via INTRAVENOUS
  Filled 2012-07-09 (×4): qty 1

## 2012-07-09 NOTE — Progress Notes (Signed)
2 Days Post-Op  Subjective: Feeling much better.  Unable to void yesterday, so Foley reinserted.  No BM or flatus yet  Objective: Vital signs in last 24 hours: Temp:  [97.5 F (36.4 C)-98.1 F (36.7 C)] 97.5 F (36.4 C) (03/19 1610) Pulse Rate:  [68-94] 86 (03/19 0608) Resp:  [12-22] 18 (03/19 0608) BP: (112-136)/(65-95) 132/68 mmHg (03/19 0608) SpO2:  [93 %-100 %] 99 % (03/19 0608) Last BM Date: 07/07/12  Intake/Output from previous day: 03/18 0701 - 03/19 0700 In: 2782.2 [I.V.:2452.2; NG/GT:30; IV Piggyback:300] Out: 2165 [Urine:1700; Emesis/NG output:300; Drains:165] Intake/Output this shift:    General appearance: alert, cooperative and no distress GI: soft, occ bowel sounds; appropriate incisional tenderness Wound - packing in place; to be changed by nursing  Lab Results:   Recent Labs  07/08/12 0419 07/09/12 0420  WBC 15.9* 19.9*  HGB 10.5* 11.3*  HCT 30.8* 32.5*  PLT 320 384   BMET  Recent Labs  07/08/12 0419 07/09/12 0420  NA 136 138  K 4.0 3.8  CL 100 101  CO2 26 24  GLUCOSE 112* 79  BUN 13 19  CREATININE 0.68 0.72  CALCIUM 8.5 9.5   PT/INR No results found for this basename: LABPROT, INR,  in the last 72 hours ABG No results found for this basename: PHART, PCO2, PO2, HCO3,  in the last 72 hours  Studies/Results: Ct Abdomen Pelvis W Contrast  07/07/2012  *RADIOLOGY REPORT*  Clinical Data: Abdominal pain and nausea.  CT ABDOMEN AND PELVIS WITH CONTRAST  Technique:  Multidetector CT imaging of the abdomen and pelvis was performed following the standard protocol during bolus administration of intravenous contrast.  Contrast: 50mL OMNIPAQUE IOHEXOL 300 MG/ML  SOLN, 80mL OMNIPAQUE IOHEXOL 300 MG/ML  SOLN  Comparison: CT of the abdomen and pelvis 04/17/2012.  Findings:  Lung Bases: There is fluid attenuation material filling many of the visualized left lower lobe bronchi with some associated atelectasis in the left base, and small left pleural effusion  layering dependently.  A small amount of pericardial fluid and/or thickening, unlikely to be of hemodynamic significance at this time.  This is unchanged.  Abdomen/Pelvis:  Axial image 39 of series 2, and sagittal image 42 of series 5 demonstrates a perforation in the anterior wall of the stomach and the antral prepyloric region.  There is gas and oral contrast material extending outside the lumen of the stomach tracking cephalad underneath the left lobe of the liver.  There is also some fluid and free air immediately anterior to the liver, as well as avid peritoneal enhancement in this region.  Thickening of the gastric wall throughout the antrum is noted, likely inflammatory (less likely to represent a perforated gastric mass).  Minimal intrahepatic biliary ductal dilatation is noted.  Common bile duct is normal in caliber measuring only 5 mm in the porta hepatis.  The gallbladder is unremarkable in appearance.  The appearance of the pancreas, spleen and bilateral adrenal glands is unremarkable.  There are three nonobstructive calculi in the right renal collecting system, largest of which measures up to 7 mm. Numerous low attenuation lesions are seen scattered throughout the kidneys bilaterally, the majority of which are too small to definitively characterize.  The largest of these lesions measures 3.3 cm in diameter in the posterior aspect of the upper pole of the right kidney, and these larger lesions are all compatible with simple cysts.  Several borderline dilated loops of small bowel are seen measuring up to 2.9 cm in diameter.  Extensive atherosclerosis throughout the abdominal and pelvic vasculature, including high-grade stenosis of the distal abdominal aorta shortly above the bifurcation where the lumen narrows to approximately the 4 x 6 mm (image 36 of series 2). Status post hysterectomy.  Left ovary is not confidently identified and may be surgically absent or atrophic.  Right ovary is remarkable for a  2.6 x 2.0 cm well-defined low attenuation lesion, presumably ovarian cyst.  Urinary bladder is unremarkable in appearance.  Musculoskeletal: There are no aggressive appearing lytic or blastic lesions noted in the visualized portions of the skeleton.  IMPRESSION: 1.  Findings, as above, compatible with acute perforation of the antral prepyloric region of the stomach along the anterior wall, with a small amount of ascites, pneumoperitoneum and oral contrast material extravasated into the peritoneal cavity, as above. The antral region of the stomach is diffusely thickened, which may simply be related to ulcer disease, although the possibility of a perforated gastric mass warrants consideration. Surgical consultation is recommended. 2.  Fluid-filled bronchi in the left lower lobe with some associated atelectasis.  Findings may reflect sequela of recent aspiration. 3.  Multiple nonobstructive calculi in the right renal collecting system, largest of which measures up to 7 mm in diameter. 4.  Extensive atherosclerosis, including high-grade narrowing of the infrarenal abdominal aorta where the lumen measures only 6 x 4 mm. 5.  Numerous low-attenuation lesions in the kidneys bilaterally, largest of which are compatible with simple cysts, while the smaller lesions are too small to definitively characterize. 6.  Small amount of pericardial fluid and/or thickening is unchanged, and unlikely to be of hemodynamic significance at this time.  These results were called by telephone on 07/07/2012 at 12:50 p.m. to Dr. Estell Harpin, who verbally acknowledged these results.   Original Report Authenticated By: Trudie Reed, M.D.    Dg Chest Port 1 View  07/07/2012  *RADIOLOGY REPORT*  Clinical Data: Shortness of breath  PORTABLE CHEST - 1 VIEW  Comparison: 04/17/2012  Findings: Chronic interstitial markings.  Small left and trace right pleural effusions.  Associated left lower lobe opacity, possibly atelectasis.  No pneumothorax.  The  heart is top normal in size.  IMPRESSION: Small left and trace right pleural effusions, new.  Associated left lower lobe opacity, possibly atelectasis.   Original Report Authenticated By: Charline Bills, M.D.     Anti-infectives: Anti-infectives   Start     Dose/Rate Route Frequency Ordered Stop   07/09/12 1000  vancomycin (VANCOCIN) 750 mg in sodium chloride 0.9 % 150 mL IVPB     750 mg 150 mL/hr over 60 Minutes Intravenous Every 24 hours 07/08/12 0818     07/08/12 1600  vancomycin (VANCOCIN) 750 mg in sodium chloride 0.9 % 150 mL IVPB  Status:  Discontinued     750 mg 150 mL/hr over 60 Minutes Intravenous Every 24 hours 07/07/12 1455 07/08/12 0818   07/08/12 0900  vancomycin (VANCOCIN) IVPB 1000 mg/200 mL premix     1,000 mg 200 mL/hr over 60 Minutes Intravenous  Once 07/08/12 0818 07/08/12 1150   07/08/12 0200  piperacillin-tazobactam (ZOSYN) IVPB 3.375 g     3.375 g 12.5 mL/hr over 240 Minutes Intravenous Every 8 hours 07/07/12 1722     07/07/12 1730  [MAR Hold]  piperacillin-tazobactam (ZOSYN) IVPB 3.375 g     (On MAR Hold since 07/07/12 1724)   3.375 g 100 mL/hr over 30 Minutes Intravenous To Surgery 07/07/12 1722 07/07/12 1737   07/07/12 1430  piperacillin-tazobactam (ZOSYN) IVPB 3.375 g  Status:  Discontinued     3.375 g 12.5 mL/hr over 240 Minutes Intravenous Every 8 hours 07/07/12 1404 07/07/12 1722   07/07/12 1430  vancomycin (VANCOCIN) IVPB 1000 mg/200 mL premix     1,000 mg 200 mL/hr over 60 Minutes Intravenous  Once 07/07/12 1410        Assessment/Plan: s/p Procedure(s): EXPLORATORY LAPAROTOMY repair of gastric perforation with omental graham patch, drainage of abdominal abcess (N/A) Severe protein calorie malnutrition d/c foley Gastrografin swallow tomorrow to rule out leak.  If negative, will remove NG and start clears Possible discharge over the weekend Will need home health nursing for dressing changes.   LOS: 2 days    Cynthia Reineck K. 07/09/2012

## 2012-07-09 NOTE — Progress Notes (Signed)
Received report on patient. Aracelly Tencza RN

## 2012-07-10 ENCOUNTER — Inpatient Hospital Stay (HOSPITAL_COMMUNITY): Payer: Medicare Other

## 2012-07-10 ENCOUNTER — Encounter (HOSPITAL_COMMUNITY): Payer: Self-pay | Admitting: Surgery

## 2012-07-10 ENCOUNTER — Telehealth: Payer: Self-pay

## 2012-07-10 LAB — CBC
HCT: 35.6 % — ABNORMAL LOW (ref 36.0–46.0)
MCH: 28.8 pg (ref 26.0–34.0)
MCHC: 33.7 g/dL (ref 30.0–36.0)
MCV: 85.6 fL (ref 78.0–100.0)
RDW: 15.4 % (ref 11.5–15.5)

## 2012-07-10 MED ORDER — ALUM & MAG HYDROXIDE-SIMETH 200-200-20 MG/5ML PO SUSP: 30.0000 mL | Freq: Four times a day (QID) | ORAL | Status: DC | PRN

## 2012-07-10 MED ORDER — MAGIC MOUTHWASH
15.0000 mL | Freq: Four times a day (QID) | ORAL | Status: DC | PRN
  Filled 2012-07-10: qty 15

## 2012-07-10 MED ORDER — METOPROLOL TARTRATE 1 MG/ML IV SOLN
5.0000 mg | Freq: Four times a day (QID) | INTRAVENOUS | Status: DC | PRN
  Filled 2012-07-10: qty 5

## 2012-07-10 MED ORDER — BISACODYL 10 MG RE SUPP: 10.0000 mg | Freq: Two times a day (BID) | RECTAL | Status: DC | PRN

## 2012-07-10 MED ORDER — LIP MEDEX EX OINT
1.0000 "application " | TOPICAL_OINTMENT | Freq: Two times a day (BID) | CUTANEOUS | Status: DC
  Administered 2012-07-10 – 2012-07-26 (×26): 1 via TOPICAL
  Filled 2012-07-10 (×2): qty 7

## 2012-07-10 MED ORDER — SODIUM CHLORIDE 0.9 % IJ SOLN
9.0000 mL | INTRAMUSCULAR | Status: DC | PRN
  Administered 2012-07-12: 09:00:00 via INTRAVENOUS

## 2012-07-10 MED ORDER — DIPHENHYDRAMINE HCL 12.5 MG/5ML PO ELIX: 12.5000 mg | ORAL_SOLUTION | Freq: Four times a day (QID) | ORAL | Status: DC | PRN

## 2012-07-10 MED ORDER — SODIUM CHLORIDE 0.9 % IJ SOLN: 9.0000 mL | INTRAMUSCULAR | Status: DC | PRN

## 2012-07-10 MED ORDER — NALOXONE HCL 0.4 MG/ML IJ SOLN: 0.4000 mg | INTRAMUSCULAR | Status: DC | PRN

## 2012-07-10 MED ORDER — HYDROMORPHONE 0.3 MG/ML IV SOLN
INTRAVENOUS | Status: DC
  Administered 2012-07-10: 0.2 mg via INTRAVENOUS

## 2012-07-10 MED ORDER — ONDANSETRON HCL 4 MG/2ML IJ SOLN: 4.0000 mg | Freq: Four times a day (QID) | INTRAMUSCULAR | Status: DC | PRN

## 2012-07-10 MED ORDER — IOHEXOL 300 MG/ML  SOLN
150.0000 mL | Freq: Once | INTRAMUSCULAR | Status: AC | PRN
  Administered 2012-07-10: 40 mL via ORAL

## 2012-07-10 MED ORDER — ACETAMINOPHEN 650 MG RE SUPP: 650.0000 mg | Freq: Four times a day (QID) | RECTAL | Status: DC | PRN

## 2012-07-10 MED ORDER — DIPHENHYDRAMINE HCL 50 MG/ML IJ SOLN: 12.5000 mg | Freq: Four times a day (QID) | INTRAMUSCULAR | Status: DC | PRN

## 2012-07-10 MED ORDER — HYDROMORPHONE 0.3 MG/ML IV SOLN
INTRAVENOUS | Status: DC
  Administered 2012-07-10: 1.19 mg via INTRAVENOUS
  Administered 2012-07-10: 2.79 mg via INTRAVENOUS
  Administered 2012-07-10: 1.19 mg via INTRAVENOUS
  Administered 2012-07-11: 1.59 mg via INTRAVENOUS
  Filled 2012-07-10: qty 25

## 2012-07-10 MED ORDER — MENTHOL 3 MG MT LOZG: 1.0000 | LOZENGE | OROMUCOSAL | Status: DC | PRN

## 2012-07-10 NOTE — Progress Notes (Signed)
3 Days Post-Op  Subjective: Patient seems anxious - using PCA frequently Eating a lot of ice chips - most of the NG canister is full of clear liquid Gastrografin swallow today   Objective: Vital signs in last 24 hours: Temp:  [97.6 F (36.4 C)-98.6 F (37 C)] 97.9 F (36.6 C) (03/20 0546) Pulse Rate:  [84-93] 93 (03/20 0546) Resp:  [15-18] 18 (03/20 0546) BP: (148-150)/(80-91) 150/83 mmHg (03/20 0546) SpO2:  [92 %-98 %] 98 % (03/20 0546) Last BM Date: 07/07/12  Intake/Output from previous day: 03/19 0701 - 03/20 0700 In: 2601.7 [P.O.:1440; I.V.:1111.7; IV Piggyback:50] Out: 3640 [Urine:2850; Emesis/NG output:650; Drains:140] Intake/Output this shift:    General appearance: alert, cooperative and no distress GI: soft, incisional tenderness;  Wound - very clean; minimal drainage; drain - thin serous output  Lab Results:   Recent Labs  07/09/12 0420 07/10/12 0010  WBC 19.9* 17.8*  HGB 11.3* 12.0  HCT 32.5* 35.6*  PLT 384 430*   BMET  Recent Labs  07/09/12 0420 07/09/12 1004  NA 138 141  K 3.8 4.2  CL 101 104  CO2 24 25  GLUCOSE 79 99  BUN 19 18  CREATININE 0.72 0.72  CALCIUM 9.5 9.4   PT/INR No results found for this basename: LABPROT, INR,  in the last 72 hours ABG No results found for this basename: PHART, PCO2, PO2, HCO3,  in the last 72 hours  Studies/Results: No results found.  Anti-infectives: Anti-infectives   Start     Dose/Rate Route Frequency Ordered Stop   07/09/12 1000  vancomycin (VANCOCIN) 750 mg in sodium chloride 0.9 % 150 mL IVPB     750 mg 150 mL/hr over 60 Minutes Intravenous Every 24 hours 07/08/12 0818     07/08/12 1600  vancomycin (VANCOCIN) 750 mg in sodium chloride 0.9 % 150 mL IVPB  Status:  Discontinued     750 mg 150 mL/hr over 60 Minutes Intravenous Every 24 hours 07/07/12 1455 07/08/12 0818   07/08/12 0900  vancomycin (VANCOCIN) IVPB 1000 mg/200 mL premix     1,000 mg 200 mL/hr over 60 Minutes Intravenous  Once  07/08/12 0818 07/08/12 1150   07/08/12 0200  piperacillin-tazobactam (ZOSYN) IVPB 3.375 g     3.375 g 12.5 mL/hr over 240 Minutes Intravenous Every 8 hours 07/07/12 1722     07/07/12 1730  [MAR Hold]  piperacillin-tazobactam (ZOSYN) IVPB 3.375 g     (On MAR Hold since 07/07/12 1724)   3.375 g 100 mL/hr over 30 Minutes Intravenous To Surgery 07/07/12 1722 07/07/12 1737   07/07/12 1430  piperacillin-tazobactam (ZOSYN) IVPB 3.375 g  Status:  Discontinued     3.375 g 12.5 mL/hr over 240 Minutes Intravenous Every 8 hours 07/07/12 1404 07/07/12 1722   07/07/12 1430  vancomycin (VANCOCIN) IVPB 1000 mg/200 mL premix     1,000 mg 200 mL/hr over 60 Minutes Intravenous  Once 07/07/12 1410        Assessment/Plan: s/p Procedure(s): EXPLORATORY LAPAROTOMY repair of gastric perforation with omental graham patch, drainage of abdominal abcess (N/A) Gastrografin swallow today - if no leak, d/c NG tube and start clears Adjust to full dose PCA If patient is able to wean herself off of PCA in the next couple of days, will transition to PO pain meds. Continue abx for now - WBC trending down  Case manager - when arranging home health, do NOT use [facility].   LOS: 3 days    Cynthia Calma K. 07/10/2012

## 2012-07-10 NOTE — Telephone Encounter (Signed)
Patient called and left message on voice mail. Patient is in [facility] and was asking for Dr Merla Riches to come. Patient did not leave any contact information.

## 2012-07-11 DIAGNOSIS — E43 Unspecified severe protein-calorie malnutrition: Secondary | ICD-10-CM | POA: Diagnosis present

## 2012-07-11 DIAGNOSIS — K436 Other and unspecified ventral hernia with obstruction, without gangrene: Secondary | ICD-10-CM | POA: Diagnosis not present

## 2012-07-11 MED ORDER — HYDROMORPHONE 0.3 MG/ML IV SOLN
INTRAVENOUS | Status: DC
  Administered 2012-07-11: 1.99 mg via INTRAVENOUS
  Administered 2012-07-11: 1.59 mg via INTRAVENOUS
  Administered 2012-07-11: 3.19 mg via INTRAVENOUS
  Administered 2012-07-11: 2.19 mg via INTRAVENOUS
  Administered 2012-07-11: 0.3 mg via INTRAVENOUS
  Administered 2012-07-12: 2.19 mg via INTRAVENOUS
  Administered 2012-07-12: 56.79 mg via INTRAVENOUS
  Administered 2012-07-12: 0.4 mg via INTRAVENOUS
  Filled 2012-07-11: qty 25

## 2012-07-11 MED ORDER — PROMETHAZINE HCL 25 MG PO TABS
12.5000 mg | ORAL_TABLET | Freq: Four times a day (QID) | ORAL | Status: DC | PRN
  Filled 2012-07-11: qty 1

## 2012-07-11 MED ORDER — TRIAMCINOLONE ACETONIDE 0.1 % EX CREA: 1.0000 "application " | TOPICAL_CREAM | CUTANEOUS | Status: DC | PRN

## 2012-07-11 MED ORDER — SACCHAROMYCES BOULARDII 250 MG PO CAPS
250.0000 mg | ORAL_CAPSULE | Freq: Two times a day (BID) | ORAL | Status: DC
  Administered 2012-07-11 – 2012-07-12 (×4): 250 mg via ORAL
  Filled 2012-07-11 (×6): qty 1

## 2012-07-11 MED ORDER — DM-GUAIFENESIN ER 30-600 MG PO TB12
1.0000 | ORAL_TABLET | Freq: Two times a day (BID) | ORAL | Status: DC
  Administered 2012-07-11 (×2): 1 via ORAL
  Administered 2012-07-12 (×2): via ORAL
  Filled 2012-07-11 (×7): qty 1

## 2012-07-11 MED ORDER — FLUOXETINE HCL 20 MG PO CAPS
20.0000 mg | ORAL_CAPSULE | Freq: Every day | ORAL | Status: DC
  Administered 2012-07-11 – 2012-07-12 (×2): 20 mg via ORAL
  Filled 2012-07-11 (×4): qty 1

## 2012-07-11 MED ORDER — ONDANSETRON 8 MG PO TBDP
8.0000 mg | ORAL_TABLET | Freq: Three times a day (TID) | ORAL | Status: DC | PRN
  Filled 2012-07-11: qty 1

## 2012-07-11 MED ORDER — DIPHENHYDRAMINE HCL 50 MG/ML IJ SOLN: 12.5000 mg | Freq: Four times a day (QID) | INTRAMUSCULAR | Status: DC | PRN

## 2012-07-11 MED ORDER — ENSURE COMPLETE PO LIQD
237.0000 mL | Freq: Three times a day (TID) | ORAL | Status: DC
  Administered 2012-07-11 – 2012-07-12 (×4): 237 mL via ORAL

## 2012-07-11 MED ORDER — DIPHENHYDRAMINE HCL 12.5 MG/5ML PO ELIX: 12.5000 mg | ORAL_SOLUTION | Freq: Three times a day (TID) | ORAL | Status: DC | PRN

## 2012-07-11 MED ORDER — DIPHENHYDRAMINE HCL 25 MG PO CAPS: 25.0000 mg | ORAL_CAPSULE | Freq: Three times a day (TID) | ORAL | Status: DC | PRN

## 2012-07-11 MED ORDER — FAMOTIDINE 20 MG PO TABS
20.0000 mg | ORAL_TABLET | Freq: Two times a day (BID) | ORAL | Status: DC
  Administered 2012-07-11 – 2012-07-12 (×4): 20 mg via ORAL
  Filled 2012-07-11 (×6): qty 1

## 2012-07-11 MED ORDER — PSYLLIUM 95 % PO PACK
1.0000 | PACK | Freq: Two times a day (BID) | ORAL | Status: DC
  Administered 2012-07-11 – 2012-07-12 (×4): 1 via ORAL
  Filled 2012-07-11 (×6): qty 1

## 2012-07-11 MED ORDER — ADULT MULTIVITAMIN W/MINERALS CH
1.0000 | ORAL_TABLET | Freq: Every day | ORAL | Status: DC
  Administered 2012-07-11 – 2012-07-12 (×2): 1 via ORAL
  Filled 2012-07-11 (×3): qty 1

## 2012-07-11 MED ORDER — ALBUTEROL SULFATE HFA 108 (90 BASE) MCG/ACT IN AERS
2.0000 | INHALATION_SPRAY | RESPIRATORY_TRACT | Status: DC | PRN
  Filled 2012-07-11: qty 6.7

## 2012-07-11 MED ORDER — PANTOPRAZOLE SODIUM 40 MG PO TBEC
40.0000 mg | DELAYED_RELEASE_TABLET | Freq: Two times a day (BID) | ORAL | Status: DC
  Administered 2012-07-11 – 2012-07-12 (×4): 40 mg via ORAL
  Filled 2012-07-11 (×5): qty 1

## 2012-07-11 MED ORDER — EPINEPHRINE 0.3 MG/0.3ML IJ DEVI: 0.3000 mg | INTRAMUSCULAR | Status: DC | PRN

## 2012-07-11 MED ORDER — MAGNESIUM HYDROXIDE 400 MG/5ML PO SUSP: 30.0000 mL | Freq: Two times a day (BID) | ORAL | Status: DC | PRN

## 2012-07-11 NOTE — Progress Notes (Signed)
Cynthia Bowen [id number] 1945-04-29  CARE TEAM:  PCP: Tonye Pearson, MD  Outpatient Care Team: Patient Care Team: Tonye Pearson, MD as PCP - General (Family Medicine)  Inpatient Treatment Team: Treatment Team: Attending Provider: [name]. [name], MD; Registered Nurse: Jonathon Bellows, RN; Dietitian: Lavena Bullion, RD; Registered Nurse: Amber Lynder Parents, RN; Technician: Vella Raring, NT; Consulting Physician: Ardeth Sportsman, MD; Registered Nurse: Tristan Schroeder, RN   Subjective:  Up in chair Better w NGT out  Mild nausea, pain  Objective:  Vital signs:  Filed Vitals:   07/10/12 2129 07/10/12 2138 07/11/12 0436 07/11/12 0633  BP: 143/79   150/80  Pulse: 86   84  Temp: 97.5 F (36.4 C)   98.1 F (36.7 C)  TempSrc: Oral   Oral  Resp: 14 12 14 14   Height:      Weight:      SpO2: 96% 94% 97% 90%    Last BM Date: 07/11/12  Intake/Output   Yesterday:  03/20 0701 - 03/21 0700 In: 2278.8 [P.O.:360; I.V.:1568.8; IV Piggyback:350] Out: 3225 [Urine:2450; Emesis/NG output:750; Drains:25] This shift:     Bowel function:  Flatus: y  BM: n  Drain: Minimal serous  Physical Exam:  General: Pt awake/alert/oriented x4 in no acute distress Eyes: PERRL, normal EOM.  Sclera clear.  No icterus Neuro: CN II-XII intact w/o focal sensory/motor deficits. Lymph: No head/neck/groin lymphadenopathy Psych:  No delerium/psychosis/paranoia HENT: Normocephalic, Mucus membranes moist.  No thrush Neck: Supple, No tracheal deviation Chest: No chest wall pain w good excursion CV:  Pulses intact.  Regular rhythm MS: Normal AROM mjr joints.  No obvious deformity Abdomen: Soft.  Nondistended.  Clean open wound.  Mildly tender at incisions only.  No incarcerated hernias. Ext:  SCDs BLE.  No mjr edema.  No cyanosis Skin: No petechiae / purpurae]  Results:   Labs: Results for orders placed during the hospital encounter of 07/07/12 (from the past 48  hour(s))  BASIC METABOLIC PANEL     Status: Abnormal   Collection Time    07/09/12 10:04 AM      Result Value Range   Sodium 141  135 - 145 mEq/L   Potassium 4.2  3.5 - 5.1 mEq/L   Chloride 104  96 - 112 mEq/L   CO2 25  19 - 32 mEq/L   Glucose, Bld 99  70 - 99 mg/dL   BUN 18  6 - 23 mg/dL   Creatinine, Ser 7.82  0.50 - 1.10 mg/dL   Calcium 9.4  8.4 - 95.6 mg/dL   GFR calc non Af Amer 88 (*) >90 mL/min   GFR calc Af Amer >90  >90 mL/min   Comment:            The eGFR has been calculated     using the CKD EPI equation.     This calculation has not been     validated in all clinical     situations.     eGFR's persistently     <90 mL/min signify     possible Chronic Kidney Disease.  CBC     Status: Abnormal   Collection Time    07/10/12 12:10 AM      Result Value Range   WBC 17.8 (*) 4.0 - 10.5 K/uL   RBC 4.16  3.87 - 5.11 MIL/uL   Hemoglobin 12.0  12.0 - 15.0 g/dL   HCT 21.3 (*) 08.6 - 57.8 %  MCV 85.6  78.0 - 100.0 fL   MCH 28.8  26.0 - 34.0 pg   MCHC 33.7  30.0 - 36.0 g/dL   RDW 40.9  81.1 - 91.4 %   Platelets 430 (*) 150 - 400 K/uL    Imaging / Studies: Dg Ugi W/water Sol Cm  07/10/2012  *RADIOLOGY REPORT*  Clinical Data:  History of gastric perforation.  Status post repair.  Evaluate for leak.  UPPER GI SERIES WITHOUT KUB  Technique:  Routine upper GI series was performed with water soluble contrast.  Fluoroscopy Time: 2.4 minutes  Comparison:  CT scan 07/07/2012.  Findings: There is an NG tube coursing down the esophagus and into the stomach.  The esophagus appears normal.  The GE junction is unremarkable.  Contrast exits the stomach and enters the duodenum without difficulty.  No leaking oral contrast is demonstrated to suggest perforation.  The proximal small bowel loops are normal.  IMPRESSION: No extravasation of contrast was seen from the stomach.   Original Report Authenticated By: [name], M.D.     Medications / Allergies: per  chart  Antibiotics: Anti-infectives   Start     Dose/Rate Route Frequency Ordered Stop   07/09/12 1000  vancomycin (VANCOCIN) 750 mg in sodium chloride 0.9 % 150 mL IVPB     750 mg 150 mL/hr over 60 Minutes Intravenous Every 24 hours 07/08/12 0818     07/08/12 1600  vancomycin (VANCOCIN) 750 mg in sodium chloride 0.9 % 150 mL IVPB  Status:  Discontinued     750 mg 150 mL/hr over 60 Minutes Intravenous Every 24 hours 07/07/12 1455 07/08/12 0818   07/08/12 0900  vancomycin (VANCOCIN) IVPB 1000 mg/200 mL premix     1,000 mg 200 mL/hr over 60 Minutes Intravenous  Once 07/08/12 0818 07/08/12 1150   07/08/12 0200  piperacillin-tazobactam (ZOSYN) IVPB 3.375 g     3.375 g 12.5 mL/hr over 240 Minutes Intravenous Every 8 hours 07/07/12 1722     07/07/12 1730  [MAR Hold]  piperacillin-tazobactam (ZOSYN) IVPB 3.375 g     (On MAR Hold since 07/07/12 1724)   3.375 g 100 mL/hr over 30 Minutes Intravenous To Surgery 07/07/12 1722 07/07/12 1737   07/07/12 1430  piperacillin-tazobactam (ZOSYN) IVPB 3.375 g  Status:  Discontinued     3.375 g 12.5 mL/hr over 240 Minutes Intravenous Every 8 hours 07/07/12 1404 07/07/12 1722   07/07/12 1430  vancomycin (VANCOCIN) IVPB 1000 mg/200 mL premix  Status:  Discontinued     1,000 mg 200 mL/hr over 60 Minutes Intravenous  Once 07/07/12 1410 07/10/12 1246      Problem List:   Principal Problem:   Gastric perforation with abscess/peritonitis s/p omental patch repair 07/07/2012 Active Problems:   Depression   GERD (gastroesophageal reflux disease)   Osteoarthritis   Fibromyalgia   Back pain   COPD (chronic obstructive pulmonary disease)   Unspecified gastritis and gastroduodenitis without mention of hemorrhage   Loss of weight   Anorexia   Incarcerated epigastric hernia s/p primary repair 07/02/2012   Assessment  Cynthia Bowen  67 y.o. female  4 Days Post-Op  Procedure(s): EXPLORATORY LAPAROTOMY repair of gastric perforation with omental graham patch,  drainage of abdominal abcess  Improving  Plan:  -liquids - adv diet slowly.  Add PO supp for malnutrition -switch to PO meds -PPI & H2B for gastrirtis -depression - prozac -try to wean off PCA.  Start PO tylenol & PRN oxy -bowel regimen -VTE prophylaxis- SCDs,  etc -mobilize as tolerated to help recovery - PT/OT evals -IV ABx x7 d total.  Stop vancomycin & do zosyn only since no evid of MRSA -cont drain until d/c & tolerating PO well  Ardeth Sportsman, M.D., F.A.C.S. Gastrointestinal and Minimally Invasive Surgery [facility] [address], [address] [place], [place] [address] [phone] Main / Paging [phone] Voice Mail   07/11/2012

## 2012-07-11 NOTE — Progress Notes (Signed)
ANTIBIOTIC CONSULT NOTE - Follow Up  Pharmacy Consult for zosyn Indication: suspected pneumonia  Allergies  Allergen Reactions  . Avelox (Moxifloxacin Hcl In Nacl) Nausea And Vomiting  . Alendronate Sodium Nausea And Vomiting  . Aspirin Nausea Only  . Codeine Nausea And Vomiting  . Doxycycline   . Fluconazole   . Hydrocodone Nausea And Vomiting    GI distress  . Neurontin (Gabapentin) Other (See Comments)    Mood changes   . Nsaids Other (See Comments)    Severe gastritis & perforation - avoid NSAIDs when possible  . Sertraline Hcl Other (See Comments)    Hallucinations   . Sulfa Antibiotics Rash    Patient Measurements: Height: 5' 4.96" (165 cm) (Documented 07/02/12) Weight: 96 lb 5.5 oz (43.7 kg) (Documented 07/02/12) IBW/kg (Calculated) : 56.91 From 07/02/2012: Height 65 inches Weight 43.7 kg  Vital Signs: Temp: 98.1 F (36.7 C) (03/21 1610) Temp src: Oral (03/21 9604) BP: 150/80 mmHg (03/21 5409) Pulse Rate: 84 (03/21 0633) Intake/Output from previous day: 03/20 0701 - 03/21 0700 In: 2278.8 [P.O.:360; I.V.:1568.8; IV Piggyback:350] Out: 3225 [Urine:2450; Emesis/NG output:750; Drains:25] Intake/Output from this shift: Total I/O In: 360 [P.O.:360] Out: 0   Labs:  Recent Labs  07/09/12 0420 07/09/12 1004 07/10/12 0010  WBC 19.9*  --  17.8*  HGB 11.3*  --  12.0  PLT 384  --  430*  CREATININE 0.72 0.72  --    Estimated Creatinine Clearance: 48.4 ml/min (by C-G formula based on Cr of 0.72).   Microbiology: Recent Results (from the past 720 hour(s))  SURGICAL PCR SCREEN     Status: None   Collection Time    06/30/12  1:05 PM      Result Value Range Status   MRSA, PCR NEGATIVE  NEGATIVE Final   Staphylococcus aureus NEGATIVE  NEGATIVE Final   Comment:            The Xpert SA Assay (FDA     approved for NASAL specimens     in patients over 24 years of age),     is one component of     a comprehensive surveillance     program.  Test performance has      been validated by [employer] for patients greater     than or equal to 11 year old.     It is not intended     to diagnose infection nor to     guide or monitor treatment.    Medical History: Past Medical History  Diagnosis Date  . COPD (chronic obstructive pulmonary disease)   . Pneumonia 12-2011  . GERD (gastroesophageal reflux disease)   . Headache   . Arthritis     osteoarthritis  . Allergy   . Depression   . Neuromuscular disorder   . Osteoporosis   . Bronchitis     CURRENTLY AS OF 06/30/12 - HAS COUGH AND FINISHED ANTIBIOTIC FOR BRONCHITIS  . Fibromyalgia   . Pain     ABDOMINAL PAIN AND NAUSEA  . Pain     SOMETIMES PAIN RIGHT EAR AND NECK--STATES CAUSED BY A "LUMP" ON BACK OF EAR--USES KENALOG CREAM TOPICALLY AS NEEDED.   Assessment: 67 y/o F recently hospitalized 3/12-3/14 for supraumbilical hernia repair and gastric biopsy, presented with persistent cough, low-grade fever, and leukocytosis following completion of oral antibiotic course for bronchitis.  CXR showed LLL opacity.  Pharmacy assistance was requested for antibiotic dosing of vancomycin and Zosyn for  suspected pneumonia.    3/18 >>vanco >> 3/21 3/17 >>Zosyn EI >>   Tmax: Afeb WBCs: 17.8 Renal: SCr 0.72, CrCl ~=48 (CG), 88(N) (but suspect this may be an overestimate given patient's extremely small muscle mass)  No cxs.  Goal Range:  Vancomycin trough 15-20  Plan:  Day #5 zosyn  Continue Zosyn 3.375 grams IV q8h (extended-infusion). Stop date added. Vancomycin stopped today  Juliette Alcide, PharmD, BCPS.   Pager: [phone] 07/11/2012 10:10 AM

## 2012-07-11 NOTE — Evaluation (Signed)
Physical Therapy Evaluation Patient Details Name: Cynthia Bowen MRN: [id number] DOB: March 29, 1946 Today's Date: 07/11/2012 Time: 4696-2952 PT Time Calculation (min): 15 min  PT Assessment / Plan / Recommendation Clinical Impression  67 yo female s/p exp lap, drainage of abscess, omental patch of gastric perforation. Pt mobilizing fairly well-limited by pain, decreased activity tolerance. Pt states she will have assistance at home. Recommend HHPT, RW.     PT Assessment  Patient needs continued PT services    Follow Up Recommendations  Home health PT    Does the patient have the potential to tolerate intense rehabilitation      Barriers to Discharge        Equipment Recommendations  Rolling walker with 5" wheels    Recommendations for Other Services OT consult   Frequency Min 3X/week    Precautions / Restrictions Precautions Precautions: Fall Restrictions Weight Bearing Restrictions: No   Pertinent Vitals/Pain 10/10 after ambulation. Pt able to use PCA pump.      Mobility  Bed Mobility Bed Mobility: Not assessed Details for Bed Mobility Assistance: Pt sitting in recliner Transfers Transfers: Sit to Stand;Stand to Sit Sit to Stand: 4: Min guard;From chair/3-in-1;With armrests Stand to Sit: 4: Min guard;To chair/3-in-1;With armrests Details for Transfer Assistance: VCS safety Ambulation/Gait Ambulation Distance (Feet): 250 Feet Ambulation/Gait Assistance Details: Assist to stabilize intermittently. Slow gait speed. Pt held onto IV pole. Fatigued fairly easily. Pt unable to attempt ambulation without support (couldn't let go of pole) when prompted. Will likely need assistive device for support.  Gait Pattern: Step-through pattern;Decreased step length - right;Decreased step length - left;Decreased stride length    Exercises     PT Diagnosis: Difficulty walking;Abnormality of gait;Acute pain  PT Problem List: Decreased mobility;Decreased activity tolerance;Decreased  knowledge of use of DME PT Treatment Interventions: DME instruction;Gait training;Stair training;Functional mobility training;Therapeutic activities;Wheelchair mobility training   PT Goals Acute Rehab PT Goals PT Goal Formulation: With patient Time For Goal Achievement: 07/18/12 Potential to Achieve Goals: Good Pt will go Supine/Side to Sit: with supervision PT Goal: Supine/Side to Sit - Progress: Goal set today Pt will go Sit to Supine/Side: with supervision PT Goal: Sit to Supine/Side - Progress: Goal set today Pt will go Sit to Stand: with supervision PT Goal: Sit to Stand - Progress: Goal set today Pt will Ambulate: 51 - 150 feet;with supervision;with least restrictive assistive device PT Goal: Ambulate - Progress: Goal set today Pt will Go Up / Down Stairs: 3-5 stairs;with supervision;with rail(s) PT Goal: Up/Down Stairs - Progress: Goal set today  Visit Information  Last PT Received On: 07/11/12 Assistance Needed: +1    Subjective Data  Subjective: "Thank God"  (when told we were getting closer to room after walking Patient Stated Goal: home   Prior Functioning  Home Living Lives With: Family (nephew) Type of Home: Mobile home Home Access: Stairs to enter Secretary/administrator of Steps: 6 Entrance Stairs-Rails: Right;Left Home Layout: One level Home Adaptive Equipment: None Prior Function Level of Independence: Independent Communication Communication: No difficulties    Cognition  Cognition Overall Cognitive Status: Appears within functional limits for tasks assessed/performed Arousal/Alertness: Awake/alert Orientation Level: Appears intact for tasks assessed Behavior During Session: [facility] for tasks performed    Extremity/Trunk Assessment     Balance    End of Session PT - End of Session Activity Tolerance: Patient limited by fatigue Patient left: in chair;with call bell/phone within reach;with family/visitor present  GP     Rebeca Alert, MPT Pager:  [phone]

## 2012-07-12 MED ORDER — HYDROCODONE-ACETAMINOPHEN 10-325 MG PO TABS: 2.0000 | ORAL_TABLET | ORAL | Status: DC | PRN

## 2012-07-12 MED ORDER — HYDROMORPHONE HCL PF 1 MG/ML IJ SOLN
0.5000 mg | INTRAMUSCULAR | Status: DC | PRN
  Administered 2012-07-12 – 2012-07-13 (×4): 0.5 mg via INTRAVENOUS
  Filled 2012-07-12 (×6): qty 1

## 2012-07-12 MED ORDER — OXYCODONE-ACETAMINOPHEN 5-325 MG PO TABS
1.0000 | ORAL_TABLET | ORAL | Status: DC | PRN
  Administered 2012-07-12 – 2012-07-13 (×3): 1 via ORAL
  Filled 2012-07-12 (×3): qty 1

## 2012-07-12 NOTE — Progress Notes (Signed)
Physical Therapy Treatment Patient Details Name: Cynthia Bowen MRN: [id number] DOB: [birth year] Today's Date: 07/12/2012 Time: 4098-1191 PT Time Calculation (min): 19 min  PT Assessment / Plan / Recommendation Comments on Treatment Session  Pt continues to have 10/10 pain in abdomen and is using PCA pump as needed.  Ambulation limited by pain and needs max cues for proper use of RW.  Feel that pt may benefit from ST SNF pending progress while in hospital.     Follow Up Recommendations  Home health PT;SNF;Supervision/Assistance - 24 hour     Does the patient have the potential to tolerate intense rehabilitation     Barriers to Discharge        Equipment Recommendations  Rolling walker with 5" wheels    Recommendations for Other Services    Frequency Min 3X/week   Plan Discharge plan needs to be updated    Precautions / Restrictions Precautions Precautions: Fall Restrictions Weight Bearing Restrictions: No   Pertinent Vitals/Pain 10/10 pain in abdomen.  PCA utilized.     Mobility  Bed Mobility Bed Mobility: Not assessed Details for Bed Mobility Assistance: Pt sitting in recliner Transfers Transfers: Sit to Stand;Stand to Sit Sit to Stand: 4: Min assist;With upper extremity assist;With armrests;From chair/3-in-1 Stand to Sit: 4: Min assist;With upper extremity assist;With armrests;To chair/3-in-1 Details for Transfer Assistance: Max cues for hand placement when standing.  She did well using arm rests to scoot buttocks forward in chair, however then reached for RW to stand.  Ambulation/Gait Ambulation/Gait Assistance: 4: Min assist;3: Mod assist Ambulation Distance (Feet): 62 Feet Assistive device: Rolling walker Ambulation/Gait Assistance Details: Requires max verbal and manual cues for sequencing/technique with RW as pt tends to push RW too far forward and have forward flexed posture due to increased pain.  Pt with facial grimacing during amb, however had to be cued to  turn around and ambulate back to room.  Gait Pattern: Step-through pattern;Decreased step length - right;Decreased step length - left;Decreased stride length Gait velocity: decreased    Exercises     PT Diagnosis:    PT Problem List:   PT Treatment Interventions:     PT Goals Acute Rehab PT Goals PT Goal Formulation: With patient Time For Goal Achievement: 07/18/12 Potential to Achieve Goals: Good Pt will go Sit to Stand: with supervision PT Goal: Sit to Stand - Progress: Progressing toward goal Pt will Ambulate: 51 - 150 feet;with supervision;with least restrictive assistive device PT Goal: Ambulate - Progress: Progressing toward goal  Visit Information  Last PT Received On: 07/12/12 Assistance Needed: +1    Subjective Data  Subjective: I'm okay  Patient Stated Goal: home   Cognition  Cognition Overall Cognitive Status: Impaired Area of Impairment: Safety/judgement;Awareness of errors;Awareness of deficits Arousal/Alertness: Lethargic Orientation Level: Appears intact for tasks assessed Behavior During Session: Lethargic Safety/Judgement: Decreased awareness of safety precautions;Decreased safety judgement for tasks assessed Awareness of Errors: Assistance required to identify errors made;Assistance required to correct errors made Awareness of Errors - Other Comments: Pt required constant verbal and manual correction for using RW.  Cognition - Other Comments: pt soft spoken and did not talk to therapist except to answer questions    Balance     End of Session PT - End of Session Activity Tolerance: Patient limited by pain;Patient limited by fatigue Patient left: in chair;with call bell/phone within reach Nurse Communication: Mobility status   GP     Vista Deck 07/12/2012, 11:57 AM

## 2012-07-12 NOTE — Evaluation (Signed)
Occupational Therapy Evaluation Patient Details Name: Cynthia Bowen MRN: [id number] DOB: [birth year] Today's Date: 07/12/2012    OT Assessment / Plan / Recommendation Clinical Impression  Pt presents to OT s/p abdominal  surgery. Pt with decreased I with ADL actiivty and will benefit from skilled OT to increase I with ADL activity and return to [facility]    OT Assessment  Patient needs continued OT Services    Follow Up Recommendations  SNF;Other (comment) (may need SNF depending on progress)             Frequency  Min 3X/week    Precautions / Restrictions Precautions Precautions: Fall Restrictions Weight Bearing Restrictions: No       ADL  Eating/Feeding: Set up Where Assessed - Eating/Feeding: Edge of bed Grooming: Simulated;Wash/dry face Where Assessed - Grooming: Supported sitting Upper Body Bathing: Simulated;Minimal assistance Where Assessed - Upper Body Bathing: Unsupported sitting Lower Body Bathing: Simulated;Maximal assistance Where Assessed - Lower Body Bathing: Supported sit to stand Upper Body Dressing: Simulated;Minimal assistance Where Assessed - Upper Body Dressing: Supported sit to stand Lower Body Dressing: Simulated;Maximal assistance Where Assessed - Lower Body Dressing: Supported sit to Pharmacist, hospital: [employer] engineer Method: Sit to stand;Other (comment) (chair - ambulate - then back to chair) Transfers/Ambulation Related to ADLs: Pt with very flat affect with OT this session. Depending onprogress and help at home she may need SNF    OT Diagnosis: Generalized weakness;Acute pain  OT Problem List: Decreased strength;Decreased activity tolerance;Decreased safety awareness OT Treatment Interventions: Self-care/ADL training;DME and/or AE instruction;Patient/family education   OT Goals Acute Rehab OT Goals OT Goal Formulation: With patient Time For Goal Achievement: 07/26/12 Potential to Achieve Goals: Good ADL  Goals Pt Will Perform Grooming: with modified independence;Standing at sink ADL Goal: Grooming - Progress: Goal set today Pt Will Perform Upper Body Dressing: with modified independence;Unsupported ADL Goal: Upper Body Dressing - Progress: Goal set today Pt Will Perform Lower Body Dressing: Sit to stand from bed;with modified independence ADL Goal: Lower Body Dressing - Progress: Goal set today Pt Will Transfer to Toilet: with modified independence;Comfort height toilet Pt Will Perform Toileting - Clothing Manipulation: with modified independence;with adaptive equipment ADL Goal: Toileting - Clothing Manipulation - Progress: Goal set today Pt Will Perform Tub/Shower Transfer: Shower transfer;with modified independence ADL Goal: Tub/Shower Transfer - Progress: Goal set today  Visit Information  Last OT Received On: 07/12/12 Assistance Needed: +1    Subjective Data  Subjective: my nephew lives with me   Prior Functioning     Home Living Lives With: Family (nephew) Type of Home: Mobile home Home Access: Stairs to enter Entrance Stairs-Number of Steps: 6 Entrance Stairs-Rails: Right;Left Home Layout: One level Bathroom Shower/Tub: Health visitor: Standard Home Adaptive Equipment: None Prior Function Level of Independence: Independent Communication Communication: No difficulties         Vision/Perception Vision - History Baseline Vision: Wears glasses all the time   Cognition  Cognition Overall Cognitive Status: Impaired Area of Impairment: Safety/judgement;Awareness of errors;Awareness of deficits Arousal/Alertness: Lethargic Orientation Level: Appears intact for tasks assessed Behavior During Session: Lethargic Safety/Judgement: Decreased awareness of safety precautions;Decreased safety judgement for tasks assessed Awareness of Errors: Assistance required to identify errors made;Assistance required to correct errors made Awareness of Errors - Other  Comments: Pt required constant verbal and manual correction for using RW.  Cognition - Other Comments: pt soft spoken and did not talk to therapist except to answer questions    Extremity/Trunk Assessment  Right Upper Extremity Assessment RUE ROM/Strength/Tone: Within functional levels Left Upper Extremity Assessment LUE ROM/Strength/Tone: Within functional levels     Mobility Bed Mobility Bed Mobility: Not assessed Details for Bed Mobility Assistance: Pt sitting in recliner Transfers Transfers: Sit to Stand;Stand to Sit Sit to Stand: 4: Min assist;With upper extremity assist;With armrests;From chair/3-in-1 Stand to Sit: 4: Min assist;With upper extremity assist;With armrests;To chair/3-in-1 Details for Transfer Assistance: Max cues for hand placement when standing.  She did well using arm rests to scoot buttocks forward in chair, however then reached for RW to stand.            End of Session  Pt left in chair with call bell with in reach, as well as phone  GO     Otto Caraway, [place]ung 07/12/2012, 12:14 PM

## 2012-07-12 NOTE — Progress Notes (Signed)
5 Days Post-Op  Subjective: Sitting up in chair. Awake. A little drowsy. Apparently tolerating diet, and eats half of tray according to patient. Had a bowel movement yesterday. She still has pain and is still on PCA Dilaudid.  Afebrile. Vital signs stable.Drainage low-volume, cloudy.  Objective: Vital signs in last 24 hours: Temp:  [98.9 F (37.2 C)-99.6 F (37.6 C)] 99.6 F (37.6 C) (03/22 0554) Pulse Rate:  [94-104] 94 (03/22 0554) Resp:  [12-18] 14 (03/22 0800) BP: (125-137)/(55-66) 129/62 mmHg (03/22 0554) SpO2:  [90 %-100 %] 94 % (03/22 0800) FiO2 (%):  [36 %] 36 % (03/22 0800) Last BM Date: 07/11/12  Intake/Output from previous day: 03/21 0701 - 03/22 0700 In: 960 [P.O.:960] Out: 363 [Urine:332; Drains:30; Stool:1] Intake/Output this shift:    General appearance: Awake. Slightly drowsy. Oriented. Mental status normal. GI: abdomen soft. Appropriately tender. Clean open wound. Fascia intact. Bowel sounds present.  Lab Results:  No results found for this or any previous visit (from the past 24 hour(s)).   Studies/Results: @RISRSLT24 @  . dextromethorphan-guaiFENesin  1 tablet Oral Q12H  . enoxaparin (LOVENOX) injection  40 mg Subcutaneous Q24H  . famotidine  20 mg Oral BID  . feeding supplement  237 mL Oral TID WC  . FLUoxetine  20 mg Oral QAC breakfast  . lip balm  1 application Topical BID  . multivitamin with minerals  1 tablet Oral Daily  . pantoprazole  40 mg Oral BID AC  . piperacillin-tazobactam (ZOSYN)  IV  3.375 g Intravenous Q8H  . psyllium  1 packet Oral BID  . saccharomyces boulardii  250 mg Oral BID     Assessment/Plan: s/p Procedure(s): EXPLORATORY LAPAROTOMY repair of gastric perforation with omental graham patch, drainage of abdominal abcess   POD #5. Stable. Bone dysphagia diet. Go slow. Will discontinue PCA Dilaudid. Allowed either Vicodin or rescue IV Dilaudid. To slowly taper narcotics Continue PPI and H2-blockers for  gastritis Depression on Prozac Advance mobilization. PT and OT eval Continue drain Continue zosyn Check labs tomorrow   @PROBHOSP @  LOS: 5 days    Cynthia Bowen M 07/12/2012  . .prob

## 2012-07-13 ENCOUNTER — Encounter (HOSPITAL_COMMUNITY): Payer: Self-pay | Admitting: Anesthesiology

## 2012-07-13 ENCOUNTER — Inpatient Hospital Stay (HOSPITAL_COMMUNITY): Payer: Medicare Other

## 2012-07-13 ENCOUNTER — Encounter (HOSPITAL_COMMUNITY)
Admission: EM | Disposition: A | Discharge status: Home Health | Payer: Self-pay | Source: Ambulatory Visit | Attending: Surgery

## 2012-07-13 ENCOUNTER — Inpatient Hospital Stay (HOSPITAL_COMMUNITY): Payer: Medicare Other | Admitting: Anesthesiology

## 2012-07-13 HISTORY — PX: LAPAROTOMY: SHX154

## 2012-07-13 LAB — BASIC METABOLIC PANEL
BUN: 9 mg/dL (ref 6–23)
Creatinine, Ser: 0.58 mg/dL (ref 0.50–1.10)
GFR calc Af Amer: >90 mL/min (ref >90)
GFR calc non Af Amer: >90 mL/min (ref >90)
Glucose, Bld: 123 mg/dL — ABNORMAL HIGH (ref 70–99)

## 2012-07-13 LAB — URINALYSIS, ROUTINE W REFLEX MICROSCOPIC
Glucose, UA: NEGATIVE mg/dL
Hgb urine dipstick: NEGATIVE
Ketones, ur: NEGATIVE mg/dL
Protein, ur: NEGATIVE mg/dL
pH: 6.5 (ref 5.0–8.0)

## 2012-07-13 LAB — CBC
HCT: 32.8 % — ABNORMAL LOW (ref 36.0–46.0)
MCH: 28.7 pg (ref 26.0–34.0)
MCHC: 34.8 g/dL (ref 30.0–36.0)
MCV: 82.6 fL (ref 78.0–100.0)
RDW: 15.7 % — ABNORMAL HIGH (ref 11.5–15.5)

## 2012-07-13 SURGERY — LAPAROTOMY, EXPLORATORY
Anesthesia: General | Site: Abdomen | Wound class: Dirty or Infected

## 2012-07-13 MED ORDER — 0.9 % SODIUM CHLORIDE (POUR BTL) OPTIME
TOPICAL | Status: DC | PRN
  Administered 2012-07-13: 2000 mL

## 2012-07-13 MED ORDER — HYDROMORPHONE HCL PF 1 MG/ML IJ SOLN
0.5000 mg | INTRAMUSCULAR | Status: DC | PRN
  Administered 2012-07-13 – 2012-07-15 (×14): 1 mg via INTRAVENOUS
  Filled 2012-07-13 (×14): qty 1

## 2012-07-13 MED ORDER — HYDROMORPHONE HCL PF 1 MG/ML IJ SOLN
INTRAMUSCULAR | Status: DC | PRN
  Administered 2012-07-13 (×4): 0.5 mg via INTRAVENOUS

## 2012-07-13 MED ORDER — HYDROMORPHONE HCL PF 1 MG/ML IJ SOLN
0.2500 mg | INTRAMUSCULAR | Status: DC | PRN
  Administered 2012-07-13 (×2): 0.5 mg via INTRAVENOUS

## 2012-07-13 MED ORDER — POTASSIUM CHLORIDE 10 MEQ/100ML IV SOLN
10.0000 meq | INTRAVENOUS | Status: AC
  Administered 2012-07-13 (×3): 10 meq via INTRAVENOUS
  Filled 2012-07-13 (×5): qty 100

## 2012-07-13 MED ORDER — PROMETHAZINE HCL 25 MG/ML IJ SOLN: 6.2500 mg | INTRAMUSCULAR | Status: DC | PRN

## 2012-07-13 MED ORDER — POTASSIUM CHLORIDE IN NACL 20-0.9 MEQ/L-% IV SOLN
INTRAVENOUS | Status: AC
  Administered 2012-07-13 – 2012-07-14 (×4): via INTRAVENOUS
  Filled 2012-07-13 (×3): qty 1000

## 2012-07-13 MED ORDER — LACTATED RINGERS IV SOLN
INTRAVENOUS | Status: DC | PRN
  Administered 2012-07-13: 16:00:00 via INTRAVENOUS

## 2012-07-13 MED ORDER — PROPOFOL 10 MG/ML IV EMUL
INTRAVENOUS | Status: DC | PRN
  Administered 2012-07-13: 120 mg via INTRAVENOUS

## 2012-07-13 MED ORDER — IOHEXOL 300 MG/ML  SOLN
100.0000 mL | Freq: Once | INTRAMUSCULAR | Status: AC | PRN
  Administered 2012-07-13: 100 mL via INTRAVENOUS

## 2012-07-13 MED ORDER — IOHEXOL 300 MG/ML  SOLN
50.0000 mL | Freq: Once | INTRAMUSCULAR | Status: AC | PRN
  Administered 2012-07-13: 50 mL via ORAL

## 2012-07-13 MED ORDER — TISSEEL VH 10 ML EX KIT
PACK | CUTANEOUS | Status: DC | PRN
  Administered 2012-07-13: 10 mL

## 2012-07-13 MED ORDER — FENTANYL CITRATE 0.05 MG/ML IJ SOLN
INTRAMUSCULAR | Status: DC | PRN
  Administered 2012-07-13 (×3): 50 ug via INTRAVENOUS
  Administered 2012-07-13: 100 ug via INTRAVENOUS

## 2012-07-13 MED ORDER — GLYCOPYRROLATE 0.2 MG/ML IJ SOLN
INTRAMUSCULAR | Status: DC | PRN
  Administered 2012-07-13: 0.4 mg via INTRAVENOUS

## 2012-07-13 MED ORDER — POTASSIUM CHLORIDE IN NACL 20-0.9 MEQ/L-% IV SOLN
INTRAVENOUS | Status: AC
  Filled 2012-07-13: qty 1000

## 2012-07-13 MED ORDER — PANTOPRAZOLE SODIUM 40 MG IV SOLR
40.0000 mg | Freq: Two times a day (BID) | INTRAVENOUS | Status: DC
  Administered 2012-07-13 – 2012-07-26 (×26): 40 mg via INTRAVENOUS
  Filled 2012-07-13 (×27): qty 40

## 2012-07-13 MED ORDER — HYDROMORPHONE BOLUS VIA INFUSION: 0.5000 mg | INTRAVENOUS | Status: DC | PRN

## 2012-07-13 MED ORDER — SUCCINYLCHOLINE CHLORIDE 20 MG/ML IJ SOLN
INTRAMUSCULAR | Status: DC | PRN
  Administered 2012-07-13: 100 mg via INTRAVENOUS

## 2012-07-13 MED ORDER — NEOSTIGMINE METHYLSULFATE 1 MG/ML IJ SOLN
INTRAMUSCULAR | Status: DC | PRN
  Administered 2012-07-13: 3 mg via INTRAVENOUS

## 2012-07-13 MED ORDER — MIDAZOLAM HCL 5 MG/5ML IJ SOLN
INTRAMUSCULAR | Status: DC | PRN
  Administered 2012-07-13: 1 mg via INTRAVENOUS

## 2012-07-13 MED ORDER — CISATRACURIUM BESYLATE (PF) 10 MG/5ML IV SOLN
INTRAVENOUS | Status: DC | PRN
  Administered 2012-07-13: 6 mg via INTRAVENOUS

## 2012-07-13 MED ORDER — ONDANSETRON HCL 4 MG/2ML IJ SOLN
INTRAMUSCULAR | Status: DC | PRN
  Administered 2012-07-13 (×2): 2 mg via INTRAVENOUS

## 2012-07-13 SURGICAL SUPPLY — 36 items
APPLICATOR COTTON TIP 6IN STRL (MISCELLANEOUS) IMPLANT
BLADE EXTENDED COATED 6.5IN (ELECTRODE) IMPLANT
BLADE HEX COATED 2.75 (ELECTRODE) ×2 IMPLANT
CANISTER SUCTION 2500CC (MISCELLANEOUS) ×2 IMPLANT
CLOTH BEACON ORANGE TIMEOUT ST (SAFETY) ×2 IMPLANT
COVER MAYO STAND STRL (DRAPES) ×2 IMPLANT
DRAIN CHANNEL RND F F (WOUND CARE) ×4 IMPLANT
DRAPE LAPAROSCOPIC ABDOMINAL (DRAPES) ×2 IMPLANT
DRAPE WARM FLUID 44X44 (DRAPE) ×2 IMPLANT
DRSG PAD ABDOMINAL 8X10 ST (GAUZE/BANDAGES/DRESSINGS) ×2 IMPLANT
ELECT REM PT RETURN 9FT ADLT (ELECTROSURGICAL) ×2
ELECTRODE REM PT RTRN 9FT ADLT (ELECTROSURGICAL) ×1 IMPLANT
EVACUATOR SILICONE 100CC (DRAIN) ×4 IMPLANT
GLOVE BIOGEL PI IND STRL 7.0 (GLOVE) ×1 IMPLANT
GLOVE BIOGEL PI INDICATOR 7.0 (GLOVE) ×1
GOWN STRL NON-REIN LRG LVL3 (GOWN DISPOSABLE) ×2 IMPLANT
GOWN STRL REIN XL XLG (GOWN DISPOSABLE) ×4 IMPLANT
KIT BASIN OR (CUSTOM PROCEDURE TRAY) ×2 IMPLANT
NS IRRIG 1000ML POUR BTL (IV SOLUTION) ×4 IMPLANT
PACK GENERAL/GYN (CUSTOM PROCEDURE TRAY) ×2 IMPLANT
SPONGE GAUZE 4X4 12PLY (GAUZE/BANDAGES/DRESSINGS) ×2 IMPLANT
SPONGE LAP 18X18 X RAY DECT (DISPOSABLE) IMPLANT
STAPLER VISISTAT 35W (STAPLE) ×2 IMPLANT
SUCTION POOLE TIP (SUCTIONS) ×2 IMPLANT
SUT NOVA NAB DX-16 0-1 5-0 T12 (SUTURE) ×4 IMPLANT
SUT PDS AB 1 CTX 36 (SUTURE) IMPLANT
SUT SILK 2 0 (SUTURE) ×1
SUT SILK 2 0 SH CR/8 (SUTURE) ×2 IMPLANT
SUT SILK 2-0 18XBRD TIE 12 (SUTURE) ×1 IMPLANT
SUT SILK 3 0 (SUTURE) ×1
SUT SILK 3 0 SH CR/8 (SUTURE) ×2 IMPLANT
SUT SILK 3-0 18XBRD TIE 12 (SUTURE) ×1 IMPLANT
TAPE CLOTH SURG 4X10 WHT LF (GAUZE/BANDAGES/DRESSINGS) ×2 IMPLANT
TOWEL OR 17X26 10 PK STRL BLUE (TOWEL DISPOSABLE) ×4 IMPLANT
TRAY FOLEY CATH 14FRSI W/METER (CATHETERS) ×2 IMPLANT
YANKAUER SUCT BULB TIP NO VENT (SUCTIONS) ×2 IMPLANT

## 2012-07-13 NOTE — Progress Notes (Signed)
Notified Dr Biagio Quint patients WBC's increased to 31.3 this am. No fevers noted,night sweats noted.  Patient vss. Patient resting quietly, pt on zosyn.  Md to evaluate patient.

## 2012-07-13 NOTE — Progress Notes (Signed)
Existing PIV appears WNL.  Pt states is tender due to potassium via IV.  Heat pack placed over site for comfort.New PIV placed in left arm.  Instructed pt to request heat pack prior to any IV K runs.

## 2012-07-13 NOTE — Op Note (Signed)
Preoperative Diagnosis: gastric leak and abdominal abscess  Postoprative Diagnosis: same00]  Procedure: Procedure(s): EXPLORATORY LAPAROTOMY with drainage of intra-abdominal abscess and repair gastric leak   Surgeon: Mariella Saa   Assistants: Harriette Bouillon  Anesthesia:  General endotracheal anesthesia  Indications:   Patient is a 67 year old female status post full-thickness gastric biopsy to rule out lienitis plastica on March 12 and subsequent omental patch repair of a gastric leak on March 17. The past 24 hour she has developed progressive abdominal pain and elevated white count of 31,000. CT scan shows a large dumbbell shaped abscess with contrast and air beneath the left lobe of the liver. With these findings we recommended proceeding with exploration and drainage of abdominal abscess and attempted repair of her gastric leak. I discussed the indications and nature of the procedure and risks with the family and the patient and they understand and agree to proceed.  Procedure Detail:  Patient is brought to the operating room, placed in the supine position on the operating table, and general endotracheal anesthesia induced. She was already on broad-spectrum IV antibiotics. PIS were in place. Her dressing was removed and the abdominal staples removed and the abdomen was widely sterilely prepped and draped. Patient timeout was performed and the procedure verified. The previous fascial sutures were removed and the abdominal cavity easily entered with careful blunt dissection. I dissected down below the edge of the liver and entered a large abscess cavity anterior to the antrum containing pus and bilious material. This was completely drained and evacuated. This communicated with a small opening up to a second abscess more superiorly under the left lobe of the liver which was widely opened and drained as well. Exudate was removed to expose the omental patch and the previous silk sutures  removed and the omental patch completely freed from the stomach and the previous closure clearly identified and the stomach mobilized back in all directions to allow good exposure and evaluation. There was an approximately 1 cm full-thickness defect in the mid suture line. The surrounding gastric wall appeared relatively healthy and intact. An NG tube was placed and it was positioned immediately above the opening. The opening was then closed transversely with 4 full-thickness sutures of 2-0 silk and the defect came together without any undue tension. Following that Tisseel tissue sealant was used to cover the repair. A new omental patch was then brought up over the repair and secured with the tails of 2 of the previously placed sutures in the remainder of the Tisseel placed over this. The old Harrison Mons drain was removed and 219 Blake drains were placed one on either side of the wound with the left hand drain going up under the left lobe of the liver into the drained abscess cavity in the drained from the right line directly above the repair. The abscess cavity and the abdomen had been previously thoroughly irrigated. The fascia was then closed with interrupted #1 Novafils. The skin and subcutaneous tissue was packed open with moist saline gauze. Sponge needle and his counts were correct.  Findings: As above  Estimated Blood Loss:  less than 50 mL         Drains: 19 round Blake drain x2  Blood Given: none          Specimens:none        Complications:  * No complications entered in OR log *         Disposition: PACU - hemodynamically stable.  Condition: stable

## 2012-07-13 NOTE — Progress Notes (Signed)
Patient ID: Cynthia Bowen, female   DOB: 1945/08/27, 67 y.o.   MRN: [id number] 6 Days Post-Op  Subjective: Patient has had increasing abdominal pain throughout the day.  Objective: Vital signs in last 24 hours: Temp:  [98.1 F (36.7 C)-98.6 F (37 C)] 98.1 F (36.7 C) (03/23 0530) Pulse Rate:  [87-98] 87 (03/23 0530) Resp:  [16-20] 20 (03/23 0530) BP: (137-156)/(78-95) 149/83 mmHg (03/23 0530) SpO2:  [97 %-98 %] 98 % (03/23 0530) Last BM Date: 07/12/12  Intake/Output from previous day: 03/22 0701 - 03/23 0700 In: 18672.5 [P.O.:300; I.V.:17972.5; IV Piggyback:400] Out: 1120 [Urine:1100; Drains:20] Intake/Output this shift: Total I/O In: 0  Out: 760 [Urine:750; Drains:10]  General appearance: alert, cooperative and moderate distress GI: abdomen is diffusely tender, particularly in the upper abdomen with guarding. There is cloudy slightly purulent JP drainage.  Lab Results:   Recent Labs  07/13/12 0424  WBC 31.3*  HGB 11.4*  HCT 32.8*  PLT 448*   BMET  Recent Labs  07/13/12 0424  NA 135  K 2.8*  CL 96  CO2 29  GLUCOSE 123*  BUN 9  CREATININE 0.58  CALCIUM 9.2     Studies/Results: Dg Chest 2 View  07/13/2012  *RADIOLOGY REPORT*  Clinical Data:    Cough, abdominal pain.  CHEST - 2 VIEW  Comparison: 07/07/2012  Findings: Small left pleural effusion appears slightly increased. Some worsening of adjacent consolidation / atelectasis at the left lung base.  Right lungs clear.  Heart size normal.  Cervical fixation hardware noted.  IMPRESSION:  1.  Some interval increase in left lower lung consolidation / atelectasis with adjacent effusion.   Original Report Authenticated By: D. Andria Rhein, MD    Ct Abdomen Pelvis W Contrast  07/13/2012  *RADIOLOGY REPORT*  Clinical Data: Postop abdominal pain, question abscess.  Elevated white blood cell count.  CT ABDOMEN AND PELVIS WITH CONTRAST  Technique:  Multidetector CT imaging of the abdomen and pelvis was performed  following the standard protocol during bolus administration of intravenous contrast.  Contrast: OMNIPAQUE IOHEXOL 300 MG/ML  SOLN  Comparison: Upper GI 07/10/2012, CT 07/07/2012  Findings: Superior to the gastric body, adjacent to the surgical drain, there is an air fluid collection which is increased in volume compared to prior.  This measures 4.5 x 3.1 cm compared to 2.4 x 1.3 cm on prior.   This is at the site of the prior perforation.  This gas and fluid collection does contains oral contrast.  Difficult to say if this is from the current exam or prior exam.  There is communication with the more superior air fluid collection beneath the left hepatic margin which measures 4.2 x 2.4 cm which is new from prior.  The stomach itself is grossly normal.  There is thickening through the gastric antrum and  the first portion the duodenum.  There is a potential track from the  stomach to the fluid collection seen on sagittal image 74 (series 6).  Oral contrast flows into the small bowel without evidence obstruction.  The colon has  a moderate volume stool.  The rectum is normal.  There is a new left pleural effusion with associated passive atelectasis.  No pericardial fluid.  There is no focal hepatic lesion. There is small amount gas along the falciform ligament related to the gas collection as described above.  The pancreas, spleen, adrenal glands, and kidneys are unchanged.  There are callus calculi within the right kidney.  No significant free  fluid the pelvis.  There is a cyst associated with the right ovary unchanged.  Bladder and uterus are normal. Uterus is absent  IMPRESSION:  1.  Interval increase in volume of fluid collection at the site of prior perforation.  There is an air-fluid level with oral contrast at this level.  Suspicion for continued leak or re perforation. Surgical drain adjacent to the collection.  2.  Extension from the dominant fluid collection into a new collection more superior along the  posterior aspect the left hepatic lobe.  These gas collections are seen on the CT scout.  3.  New   left pleural effusion and atelectasis.  Findings conveyed to on-call surgeon Dr.  Johna Sheriff through the OR nurse on 07/13/2012 at 11 :15 am   Original Report Authenticated By: Genevive Bi, M.D.     Anti-infectives: Anti-infectives   Start     Dose/Rate Route Frequency Ordered Stop   07/09/12 1000  vancomycin (VANCOCIN) 750 mg in sodium chloride 0.9 % 150 mL IVPB  Status:  Discontinued     750 mg 150 mL/hr over 60 Minutes Intravenous Every 24 hours 07/08/12 0818 07/11/12 0801   07/08/12 1600  vancomycin (VANCOCIN) 750 mg in sodium chloride 0.9 % 150 mL IVPB  Status:  Discontinued     750 mg 150 mL/hr over 60 Minutes Intravenous Every 24 hours 07/07/12 1455 07/08/12 0818   07/08/12 0900  vancomycin (VANCOCIN) IVPB 1000 mg/200 mL premix     1,000 mg 200 mL/hr over 60 Minutes Intravenous  Once 07/08/12 0818 07/08/12 1150   07/08/12 0200  piperacillin-tazobactam (ZOSYN) IVPB 3.375 g     3.375 g 12.5 mL/hr over 240 Minutes Intravenous Every 8 hours 07/07/12 1722 07/15/12 0159   07/07/12 1730  [MAR Hold]  piperacillin-tazobactam (ZOSYN) IVPB 3.375 g     (On MAR Hold since 07/07/12 1724)   3.375 g 100 mL/hr over 30 Minutes Intravenous To Surgery 07/07/12 1722 07/07/12 1737   07/07/12 1430  piperacillin-tazobactam (ZOSYN) IVPB 3.375 g  Status:  Discontinued     3.375 g 12.5 mL/hr over 240 Minutes Intravenous Every 8 hours 07/07/12 1404 07/07/12 1722   07/07/12 1430  vancomycin (VANCOCIN) IVPB 1000 mg/200 mL premix  Status:  Discontinued     1,000 mg 200 mL/hr over 60 Minutes Intravenous  Once 07/07/12 1410 07/10/12 1246      Assessment/Plan: s/p Procedure(s): EXPLORATORY LAPAROTOMY repair of gastric perforation with omental graham patch, drainage of abdominal abcess As above, CT shows a recurrent leak with increasing collections of gas and contrast adjacent to the stomach and under the left  hepatic lobe. She clinically is worse with increasing pain and tenderness and markedly elevated white blood count. I discussed these findings in detail with the patient and her family. I have recommended proceeding with laparotomy. I discussed the possible alternative of continued drainage and antibiotics and nasogastric suction but I think this risks worsening sepsis. I discussed the indications and nature of the surgery. She and her family are in agreement to proceed.   LOS: 6 days    Ameliana Brashear T 07/13/2012

## 2012-07-13 NOTE — Transfer of Care (Signed)
Immediate Anesthesia Transfer of Care Note  Patient: Cynthia Bowen  Procedure(s) Performed: Procedure(s): EXPLORATORY LAPAROTOMY repair gastric leak (N/A)  Patient Location: PACU  Anesthesia Type:General  Level of Consciousness: awake, alert , oriented and patient cooperative  Airway & Oxygen Therapy: Patient Spontanous Breathing and Patient connected to face mask oxygen  Post-op Assessment: Report given to PACU RN and Post -op Vital signs reviewed and stable  Post vital signs: stable  Complications: No apparent anesthesia complications

## 2012-07-13 NOTE — Anesthesia Postprocedure Evaluation (Signed)
  Anesthesia Post-op Note  Patient: Cynthia Bowen  Procedure(s) Performed: Procedure(s) (LRB): EXPLORATORY LAPAROTOMY repair gastric leak (N/A)  Patient Location: PACU  Anesthesia Type: General  Level of Consciousness: awake and alert   Airway and Oxygen Therapy: Patient Spontanous Breathing  Post-op Pain: mild  Post-op Assessment: Post-op Vital signs reviewed, Patient's Cardiovascular Status Stable, Respiratory Function Stable, Patent Airway and No signs of Nausea or vomiting  Last Vitals:  Filed Vitals:   07/13/12 1800  BP: 186/73  Pulse: 79  Temp: 36.9 C  Resp: 15    Post-op Vital Signs: stable   Complications: No apparent anesthesia complications

## 2012-07-13 NOTE — Progress Notes (Signed)
6 Days Post-Op  Subjective: Alert. Somewhat agitated. Oriented. Complains of diffuse, nonlocalizing abdominal pain. States she is eating some of her meals and had one bowel movement yesterday. States she ambulated in the halls. Pain is a big problem.  Objective: Vital signs in last 24 hours: Temp:  [98.1 F (36.7 C)-98.6 F (37 C)] 98.1 F (36.7 C) (03/23 0530) Pulse Rate:  [87-98] 87 (03/23 0530) Resp:  [14-20] 20 (03/23 0530) BP: (137-156)/(78-95) 149/83 mmHg (03/23 0530) SpO2:  [94 %-100 %] 98 % (03/23 0530) FiO2 (%):  [36 %] 36 % (03/22 0800) Last BM Date: 07/12/12  Intake/Output from previous day: 03/22 0701 - 03/23 0700 In: 18672.5 [P.O.:300; I.V.:17972.5; IV Piggyback:400] Out: 1120 [Urine:1100; Drains:20] Intake/Output this shift:    General appearance: awake and alert. Cooperative but angry and agitated. Seems to be in some distress from pain, similar to yesterday but no worse. Resp: rhonchi at both bases. GI: abdomen is generally soft with mild diffuse tenderness. No peritoneal signs. Midline wound packed open. JP drain watery, cloudy.  No odor.Bilateral femoral pulses palpable.  Lab Results:  Results for orders placed during the hospital encounter of 07/07/12 (from the past 24 hour(s))  CBC     Status: Abnormal   Collection Time    07/13/12  4:24 AM      Result Value Range   WBC 31.3 (*) 4.0 - 10.5 K/uL   RBC 3.97  3.87 - 5.11 MIL/uL   Hemoglobin 11.4 (*) 12.0 - 15.0 g/dL   HCT 30.8 (*) 65.7 - 84.6 %   MCV 82.6  78.0 - 100.0 fL   MCH 28.7  26.0 - 34.0 pg   MCHC 34.8  30.0 - 36.0 g/dL   RDW 96.2 (*) 95.2 - 84.1 %   Platelets 448 (*) 150 - 400 K/uL  BASIC METABOLIC PANEL     Status: Abnormal   Collection Time    07/13/12  4:24 AM      Result Value Range   Sodium 135  135 - 145 mEq/L   Potassium 2.8 (*) 3.5 - 5.1 mEq/L   Chloride 96  96 - 112 mEq/L   CO2 29  19 - 32 mEq/L   Glucose, Bld 123 (*) 70 - 99 mg/dL   BUN 9  6 - 23 mg/dL   Creatinine, Ser 3.24   0.50 - 1.10 mg/dL   Calcium 9.2  8.4 - 40.1 mg/dL   GFR calc non Af Amer >90  >90 mL/min   GFR calc Af Amer >90  >90 mL/min     Studies/Results: @RISRSLT24 @  . dextromethorphan-guaiFENesin  1 tablet Oral Q12H  . enoxaparin (LOVENOX) injection  40 mg Subcutaneous Q24H  . famotidine  20 mg Oral BID  . feeding supplement  237 mL Oral TID WC  . FLUoxetine  20 mg Oral QAC breakfast  . lip balm  1 application Topical BID  . multivitamin with minerals  1 tablet Oral Daily  . pantoprazole  40 mg Oral BID AC  . piperacillin-tazobactam (ZOSYN)  IV  3.375 g Intravenous Q8H  . psyllium  1 packet Oral BID  . saccharomyces boulardii  250 mg Oral BID     Assessment/Plan: s/p Procedure(s): EXPLORATORY LAPAROTOMY repair of gastric perforation with omental graham patch, drainage of abdominal abcess  POD #6. Laparotomy, omental patch gastric perforation, drainage of abdominal abscess.  Nonlocalizing abdominal pain and leukocytosis, out of proportion to physical findings. We'll need to rule out surgical complications such as abscess or  leak. Another consideration would be mesenteric ischemia. Proceed with CT scan abdomen and pelvis with contrast now. Radiology alerted to differential diagnosis on request Chest x-ray now, rule out pneumonia Urinalysis and urine culture Check lipase and lactic acid level Continue Zosyn.  Hypokalemia. Will order IV KCl  Runs.  Depression, on Prozac and  Former tobacco abuse  @PROBHOSP @  LOS: 6 days    Cynthia Bowen M. Derrell Lolling, M.D., [facility] General and Minimally invasive Surgery Breast and Colorectal Surgery Office:   [phone] Pager:   [phone]  07/13/2012  . .prob

## 2012-07-13 NOTE — Anesthesia Preprocedure Evaluation (Addendum)
Anesthesia Evaluation  Patient identified by MRN, date of birth, ID band Patient awake    Reviewed: Allergy & Precautions, H&P , NPO status , Patient's Chart, lab work & pertinent test results  Airway Mallampati: II TM Distance: >3 FB Neck ROM: Full    Dental  (+) Poor Dentition and Edentulous Upper Only three lower teeth. Denies loose teeth.:   Pulmonary pneumonia -, unresolved, COPD COPD inhaler, former smoker,  CXR: left lower lung consolidation worsening.  She states that her breathing feels short today. Oxygen saturation 95% on 2 L/min  breath sounds clear to auscultation  Pulmonary exam normal       Cardiovascular negative cardio ROS  Rhythm:Regular Rate:Normal     Neuro/Psych  Headaches, PSYCHIATRIC DISORDERS Depression  Neuromuscular disease    GI/Hepatic Neg liver ROS, GERD-  Medicated,  Endo/Other  negative endocrine ROS  Renal/GU negative Renal ROS  negative genitourinary   Musculoskeletal  (+) Fibromyalgia -, narcotic dependent  Abdominal   Peds negative pediatric ROS (+)  Hematology negative hematology ROS (+)   Anesthesia Other Findings   Reproductive/Obstetrics negative OB ROS                          Anesthesia Physical Anesthesia Plan  ASA: III and emergent  Anesthesia Plan: General   Post-op Pain Management:    Induction: Intravenous  Airway Management Planned: Oral ETT  Additional Equipment:   Intra-op Plan:   Post-operative Plan: Extubation in OR  Informed Consent: I have reviewed the patients History and Physical, chart, labs and discussed the procedure including the risks, benefits and alternatives for the proposed anesthesia with the patient or authorized representative who has indicated his/her understanding and acceptance.   Dental advisory given  Plan Discussed with: CRNA  Anesthesia Plan Comments:         Anesthesia Quick Evaluation

## 2012-07-13 NOTE — Progress Notes (Signed)
Unable to hang all 5 KCL ordered due to the fact that pt's IV was hurting and had to restick her in the left arm.. Pt tolerated the KCL running at 50cc/hr and NS running at 50cc hr piggybacked into it.Only able to ;hang 3 bags so far.

## 2012-07-14 ENCOUNTER — Encounter (HOSPITAL_COMMUNITY): Payer: Self-pay | Admitting: General Surgery

## 2012-07-14 LAB — COMPREHENSIVE METABOLIC PANEL
AST: 28 U/L (ref 0–37)
Albumin: 1.7 g/dL — ABNORMAL LOW (ref 3.5–5.2)
Calcium: 8.4 mg/dL (ref 8.4–10.5)
Creatinine, Ser: 0.55 mg/dL (ref 0.50–1.10)
GFR calc non Af Amer: >90 mL/min (ref >90)

## 2012-07-14 LAB — CBC
MCH: 29 pg (ref 26.0–34.0)
MCV: 82.6 fL (ref 78.0–100.0)
Platelets: 447 10*3/uL — ABNORMAL HIGH (ref 150–400)
RDW: 15.8 % — ABNORMAL HIGH (ref 11.5–15.5)

## 2012-07-14 LAB — URINE CULTURE: Colony Count: 8000

## 2012-07-14 MED ORDER — SODIUM CHLORIDE 0.9 % IJ SOLN: 10.0000 mL | INTRAMUSCULAR | Status: DC | PRN

## 2012-07-14 MED ORDER — SODIUM CHLORIDE 0.9 % IV SOLN
1.0000 g | INTRAVENOUS | Status: DC
  Administered 2012-07-14 – 2012-07-23 (×10): 1 g via INTRAVENOUS
  Filled 2012-07-14 (×12): qty 1

## 2012-07-14 MED ORDER — POTASSIUM CHLORIDE IN NACL 20-0.9 MEQ/L-% IV SOLN
INTRAVENOUS | Status: AC
  Administered 2012-07-16: 11:00:00 via INTRAVENOUS
  Filled 2012-07-14 (×3): qty 1000

## 2012-07-14 MED ORDER — TRACE MINERALS CR-CU-F-FE-I-MN-MO-SE-ZN IV SOLN
INTRAVENOUS | Status: AC
  Administered 2012-07-14: 19:00:00 via INTRAVENOUS
  Filled 2012-07-14: qty 1000

## 2012-07-14 MED ORDER — FAT EMULSION 20 % IV EMUL
250.0000 mL | INTRAVENOUS | Status: AC
  Administered 2012-07-14: 250 mL via INTRAVENOUS
  Filled 2012-07-14: qty 250

## 2012-07-14 NOTE — Progress Notes (Signed)
NUTRITION FOLLOW UP  Intervention:   TPN per PharmD  Nutrition Dx:   Inadequate oral intake related to inability to eat as evidenced by NPO; ongoing  Goal:   Pt to meet >/= 90% of their estimated nutrition needs; not currently met  Monitor:   Wt; maintained  TPN initiation Bowel function Diet advancement  Assessment:   67 year old female status post full-thickness gastric biopsy to rule out lienitis plastica on March 12 and subsequent omental patch repair of a gastric leak on March 17. She developed progressive abdominal pain and elevated white count of 31,000. CT scan shows a large dumbbell shaped abscess with contrast and air beneath the left lobe of the liver. Exploration and drainage of abdominal abscess and attempted repair of her gastric leak 3/23.  RD consulted for new TPN/TNA. Pt has maintained wt for the past week. Pt reports feeling hungry today. NG tube in place.  Height: Ht Readings from Last 1 Encounters:  07/07/12 5' 4.96" (1.65 m)    Weight Status:   Wt Readings from Last 1 Encounters:  07/14/12 97 lb 10.6 oz (44.3 kg)    Re-estimated needs:  Kcal: 1400-1750 Protein: 66-80 grams Fluid: 1.4-1.8 L  Skin: abdominal incision  Diet Order: NPO   Intake/Output Summary (Last 24 hours) at 07/14/12 1333 Last data filed at 07/14/12 1200  Gross per 24 hour  Intake   3765 ml  Output   1655 ml  Net   2110 ml    Last BM: 3/22   Labs:   Recent Labs Lab 07/09/12 1004 07/13/12 0424 07/14/12 0326  NA 141 135 137  K 4.2 2.8* 3.6  CL 104 96 100  CO2 25 29 27   BUN 18 9 6   CREATININE 0.72 0.58 0.55  CALCIUM 9.4 9.2 8.4  GLUCOSE 99 123* 82    CBG (last 3)  No results found for this basename: GLUCAP,  in the last 72 hours  Scheduled Meds: . enoxaparin (LOVENOX) injection  40 mg Subcutaneous Q24H  . ertapenem  1 g Intravenous Q24H  . lip balm  1 application Topical BID  . pantoprazole (PROTONIX) IV  40 mg Intravenous Q12H    Continuous  Infusions: . 0.9 % NaCl with KCl 20 mEq / L 100 mL/hr at 07/14/12 0547    Ian Malkin RD, LDN Inpatient Clinical Dietitian Pager: [phone] After Hours Pager: [phone]

## 2012-07-14 NOTE — Progress Notes (Signed)
1 Day Post-Op  Subjective: Pain moderately well-controlled No distress  Objective: Vital signs in last 24 hours: Temp:  [97.8 F (36.6 C)-98.9 F (37.2 C)] 97.8 F (36.6 C) (03/24 0800) Pulse Rate:  [77-89] 85 (03/24 0800) Resp:  [12-18] 12 (03/24 0800) BP: (134-186)/(58-97) 148/74 mmHg (03/24 0800) SpO2:  [94 %-100 %] 99 % (03/24 0800) Weight:  [97 lb 10.6 oz (44.3 kg)] 97 lb 10.6 oz (44.3 kg) (03/24 0500) Last BM Date: 07/12/12  Intake/Output from previous day: 03/23 0701 - 03/24 0700 In: 3215 [I.V.:2650; NG/GT:30; IV Piggyback:450] Out: 2175 [Urine:1925; Emesis/NG output:100; Drains:10; Blood:100] Intake/Output this shift: Total I/O In: 100 [I.V.:100] Out: 260 [Urine:260]  General appearance: alert, cooperative and no distress Wound - clean; minimal drainage  Lab Results:   Recent Labs  07/13/12 0424 07/14/12 0326  WBC 31.3* 30.4*  HGB 11.4* 11.0*  HCT 32.8* 31.3*  PLT 448* 447*   BMET  Recent Labs  07/13/12 0424 07/14/12 0326  NA 135 137  K 2.8* 3.6  CL 96 100  CO2 29 27  GLUCOSE 123* 82  BUN 9 6  CREATININE 0.58 0.55  CALCIUM 9.2 8.4   PT/INR No results found for this basename: LABPROT, INR,  in the last 72 hours ABG No results found for this basename: PHART, PCO2, PO2, HCO3,  in the last 72 hours  Studies/Results: Dg Chest 2 View  07/13/2012  *RADIOLOGY REPORT*  Clinical Data:    Cough, abdominal pain.  CHEST - 2 VIEW  Comparison: 07/07/2012  Findings: Small left pleural effusion appears slightly increased. Some worsening of adjacent consolidation / atelectasis at the left lung base.  Right lungs clear.  Heart size normal.  Cervical fixation hardware noted.  IMPRESSION:  1.  Some interval increase in left lower lung consolidation / atelectasis with adjacent effusion.   Original Report Authenticated By: D. Andria Rhein, MD    Ct Abdomen Pelvis W Contrast  07/13/2012  *RADIOLOGY REPORT*  Clinical Data: Postop abdominal pain, question abscess.   Elevated white blood cell count.  CT ABDOMEN AND PELVIS WITH CONTRAST  Technique:  Multidetector CT imaging of the abdomen and pelvis was performed following the standard protocol during bolus administration of intravenous contrast.  Contrast: OMNIPAQUE IOHEXOL 300 MG/ML  SOLN  Comparison: Upper GI 07/10/2012, CT 07/07/2012  Findings: Superior to the gastric body, adjacent to the surgical drain, there is an air fluid collection which is increased in volume compared to prior.  This measures 4.5 x 3.1 cm compared to 2.4 x 1.3 cm on prior.   This is at the site of the prior perforation.  This gas and fluid collection does contains oral contrast.  Difficult to say if this is from the current exam or prior exam.  There is communication with the more superior air fluid collection beneath the left hepatic margin which measures 4.2 x 2.4 cm which is new from prior.  The stomach itself is grossly normal.  There is thickening through the gastric antrum and  the first portion the duodenum.  There is a potential track from the  stomach to the fluid collection seen on sagittal image 74 (series 6).  Oral contrast flows into the small bowel without evidence obstruction.  The colon has  a moderate volume stool.  The rectum is normal.  There is a new left pleural effusion with associated passive atelectasis.  No pericardial fluid.  There is no focal hepatic lesion. There is small amount gas along the falciform ligament  related to the gas collection as described above.  The pancreas, spleen, adrenal glands, and kidneys are unchanged.  There are callus calculi within the right kidney.  No significant free fluid the pelvis.  There is a cyst associated with the right ovary unchanged.  Bladder and uterus are normal. Uterus is absent  IMPRESSION:  1.  Interval increase in volume of fluid collection at the site of prior perforation.  There is an air-fluid level with oral contrast at this level.  Suspicion for continued leak or re  perforation. Surgical drain adjacent to the collection.  2.  Extension from the dominant fluid collection into a new collection more superior along the posterior aspect the left hepatic lobe.  These gas collections are seen on the CT scout.  3.  New   left pleural effusion and atelectasis.  Findings conveyed to on-call surgeon Dr.  Johna Sheriff through the OR nurse on 07/13/2012 at 11 :15 am   Original Report Authenticated By: Genevive Bi, M.D.     Anti-infectives: Anti-infectives   Start     Dose/Rate Route Frequency Ordered Stop   07/14/12 1000  ertapenem (INVANZ) 1 g in sodium chloride 0.9 % 50 mL IVPB     1 g 100 mL/hr over 30 Minutes Intravenous Every 24 hours 07/14/12 0856     07/09/12 1000  vancomycin (VANCOCIN) 750 mg in sodium chloride 0.9 % 150 mL IVPB  Status:  Discontinued     750 mg 150 mL/hr over 60 Minutes Intravenous Every 24 hours 07/08/12 0818 07/11/12 0801   07/08/12 1600  vancomycin (VANCOCIN) 750 mg in sodium chloride 0.9 % 150 mL IVPB  Status:  Discontinued     750 mg 150 mL/hr over 60 Minutes Intravenous Every 24 hours 07/07/12 1455 07/08/12 0818   07/08/12 0900  vancomycin (VANCOCIN) IVPB 1000 mg/200 mL premix     1,000 mg 200 mL/hr over 60 Minutes Intravenous  Once 07/08/12 0818 07/08/12 1150   07/08/12 0200  piperacillin-tazobactam (ZOSYN) IVPB 3.375 g  Status:  Discontinued     3.375 g 12.5 mL/hr over 240 Minutes Intravenous Every 8 hours 07/07/12 1722 07/14/12 0856   07/07/12 1730  [MAR Hold]  piperacillin-tazobactam (ZOSYN) IVPB 3.375 g     (On MAR Hold since 07/07/12 1724)   3.375 g 100 mL/hr over 30 Minutes Intravenous To Surgery 07/07/12 1722 07/07/12 1737   07/07/12 1430  piperacillin-tazobactam (ZOSYN) IVPB 3.375 g  Status:  Discontinued     3.375 g 12.5 mL/hr over 240 Minutes Intravenous Every 8 hours 07/07/12 1404 07/07/12 1722   07/07/12 1430  vancomycin (VANCOCIN) IVPB 1000 mg/200 mL premix  Status:  Discontinued     1,000 mg 200 mL/hr over 60  Minutes Intravenous  Once 07/07/12 1410 07/10/12 1246      Assessment/Plan: s/p Procedure(s): EXPLORATORY LAPAROTOMY repair gastric leak (N/A) PICC line/ TNA - severe protein calorie malnutrition contributing to recurrent gastric leak Change abx to Invanz Out to floor Await bowel funciton  LOS: 7 days    Thorne Wirz K. 07/14/2012

## 2012-07-14 NOTE — Progress Notes (Signed)
Physical Therapy Treatment Patient Details Name: JERILYNN FELDMEIER MRN: [id number] DOB: [birth year] Today's Date: 07/14/2012 Time: 1914-7829 PT Time Calculation (min): 28 min  PT Assessment / Plan / Recommendation Comments on Treatment Session   POD #7 first EXP LAP then again POD #1 EXP LAP to fix leak.  Pt in stepdown ICU. Assisted pt OOB to amb in hallway with + 2 assist.  Pt progressing slowly but very willing.    Follow Up Recommendations   SNF/24/7 home pending acute progress and family support     Does the patient have the potential to tolerate intense rehabilitation     Barriers to Discharge        Equipment Recommendations       Recommendations for Other Services    Frequency     Plan      Precautions / Restrictions Precautions Precautions: Fall Precaution Comments: ABD incision and 2 drains Restrictions Weight Bearing Restrictions: No   Pertinent Vitals/Pain C/o 9/10 ABD pain during session IV pain meds given    Mobility  Bed Mobility Bed Mobility: Supine to Sit;Sitting - Scoot to Edge of Bed Supine to Sit: 1: +2 Total assist Supine to Sit: Patient Percentage: 40% Sitting - Scoot to Edge of Bed: 1: +2 Total assist Sitting - Scoot to Edge of Bed: Patient Percentage: 30% Details for Bed Mobility Assistance: HOB elevated 45' and increased time due ABD incision and c/o 9/10 pain with act Transfers Transfers: Sit to Stand;Stand to Sit Sit to Stand: 1: +2 Total assist Sit to Stand: Patient Percentage: 80% Stand to Sit: 1: +2 Total assist Stand to Sit: Patient Percentage: 80% Details for Transfer Assistance: 50% VC's on proper tech and hand placement esp with stand to sit to controll decend. Ambulation/Gait Ambulation/Gait Assistance: 1: +2 Total assist Ambulation/Gait: Patient Percentage: 80% Ambulation Distance (Feet): 75 Feet Assistive device: Rolling walker Ambulation/Gait Assistance Details: + 2 assist for euipment/cahir/safety.  Limted distance and three  standing rest breaks due to c/o ABD pain.  Gait Pattern: Step-through pattern;Decreased step length - right;Decreased step length - left;Decreased stride length Gait velocity: decreased    VITALS                                                           Supine BP 159/70 (101), HR 88, RA 97%                                                                         EOB BP 155/120(129), HR 94, RA 99%                                                                         Standing BP 188/84(116), HR 94, RA 91%  PT Goals  progressing    Visit Information  Last PT Received On: 07/14/12    Subjective Data      Cognition    good   Balance   fair  End of Session  recliner/call light in reach   Felecia Shelling  PTA [facility]  Acute  Rehab Pager      [phone]

## 2012-07-14 NOTE — Progress Notes (Signed)
Patient evaluated for community based chronic disease management services with [facility] Care Management Program as a benefit of the COPD Gold Initiative. Spoke with patient and her sister at bedside to explain [facility] Care Management services.  Patient is currently very weak, but did briefly mention that she would like to know if [facility] services could help her with pain management.  Her sister feels that her other needs at home are met as her recent admissions are due to gastrointestinal problems.  It was agreed that we could discuss this further as a discharge plans become more clear.  Left contact information and [facility] literature at bedside. Made inpatient Marcelle Smiling RN Case Manager aware that [facility] Care Management following. Of note, [facility] Care Management services does not replace or interfere with any services that are arranged by inpatient case management or social work.  For additional questions or referrals please contact Anibal Henderson BSN RN [facility] Liaison at [phone].

## 2012-07-14 NOTE — Progress Notes (Signed)
Peripherally Inserted Central Catheter/Midline Placement  The IV Nurse has discussed with the patient and/or persons authorized to consent for the patient, the purpose of this procedure and the potential benefits and risks involved with this procedure.  The benefits include less needle sticks, lab draws from the catheter and patient may be discharged home with the catheter.  Risks include, but not limited to, infection, bleeding, blood clot (thrombus formation), and puncture of an artery; nerve damage and irregular heat beat.  Alternatives to this procedure were also discussed.  PICC/Midline Placement Documentation        Dorena Bodo 07/14/2012, 5:51 PM

## 2012-07-14 NOTE — Progress Notes (Signed)
PARENTERAL NUTRITION CONSULT NOTE - INITIAL  Pharmacy Consult for TPN Indication: Recurrent gastric perforation, prolonged NPO status  Patient Measurements: Height: 5' 4.96" (165 cm) (Documented 07/02/12) Weight: 97 lb 10.6 oz (44.3 kg) IBW/kg (Calculated) : 56.91 Usual Weight: unknown  Vital Signs: Temp: 97.8 F (36.6 C) (03/24 0800) Temp src: Oral (03/24 0800) BP: 151/74 mmHg (03/24 1000) Pulse Rate: 87 (03/24 1200) Intake/Output from previous day: 03/23 0701 - 03/24 0700 In: 3215 [I.V.:2650; NG/GT:30; IV Piggyback:450] Out: 2175 [Urine:1925; Emesis/NG output:100; Drains:10; Blood:100]  Labs:  Recent Labs  07/13/12 0424 07/14/12 0326  WBC 31.3* 30.4*  HGB 11.4* 11.0*  HCT 32.8* 31.3*  PLT 448* 447*     Recent Labs  07/13/12 0424 07/14/12 0326  NA 135 137  K 2.8* 3.6  CL 96 100  CO2 29 27  GLUCOSE 123* 82  BUN 9 6  CREATININE 0.58 0.55  CALCIUM 9.2 8.4  PROT  --  5.6*  ALBUMIN  --  1.7*  AST  --  28  ALT  --  18  ALKPHOS  --  71  BILITOT  --  0.3   Estimated Creatinine Clearance: 49 ml/min (by C-G formula based on Cr of 0.55).   No results found for this basename: GLUCAP,  in the last 72 hours  Medical History: Past Medical History  Diagnosis Date  . COPD (chronic obstructive pulmonary disease)   . Pneumonia 12-2011  . GERD (gastroesophageal reflux disease)   . Headache   . Arthritis     osteoarthritis  . Allergy   . Depression   . Neuromuscular disorder   . Osteoporosis   . Bronchitis     CURRENTLY AS OF 06/30/12 - HAS COUGH AND FINISHED ANTIBIOTIC FOR BRONCHITIS  . Fibromyalgia   . Pain     ABDOMINAL PAIN AND NAUSEA  . Pain     SOMETIMES PAIN RIGHT EAR AND NECK--STATES CAUSED BY A "LUMP" ON BACK OF EAR--USES KENALOG CREAM TOPICALLY AS NEEDED.    Nutritional Goals:   RD recs 07/14/12:  Kcal: 1400-1750, Protein: 66-80 grams, Fluid: 1.4-1.8 L  Clinimix 5/20 at 65 ml/hr + Lipids MWF to provide 78 g protein/day and an average of  1579  Kcal/day (1373 kcal w/o lipids on TThSS and 1853 kcal w/ lipids on MWF)  Current nutrition:   Diet: NPO  IVF: NS +20KCl at 100 ml/hr  CBGs & Insulin requirements past 24 hours:   Glucose 82 (3/24 AM)  Assessment:  67 y/o F recently hospitalized 3/12-3/14 for supraumbilical hernia repair and gastric biopsy, admitted with gastric perforation s/p repair on 07/13/12.  PICC is ordered for 3/24 with TPN to start for ongoing/prolonged NPO status and gastric leak.  NG tube in place.  Pt at risk for refeeding syndrome.  Renal/hepatic function: SCr WNL, LFTs WNL  Electrolytes: WNL  Pre-Albumin: pending  TG/Cholesterol: pending  Plan:  At 1800 tonight Begin Clinimix E 5/20 at 40 ml/hr Fat emulsion at 10 ml/hr (MWF only due to ongoing shortage). Standard multivitamins and trace elements (MWF only due to ongoing shortage). Reduce IVF to 60 ml/hr. Add CBGs q8h; add SSI if needed. TNA lab panels on Mondays & Thursdays. F/u daily.   Lynann Beaver PharmD, BCPS Pager [phone] 07/14/2012 2:09 PM

## 2012-07-14 NOTE — Telephone Encounter (Signed)
Please call to check on her post surgery/let her know i've been gone /ask if she needs something

## 2012-07-15 ENCOUNTER — Inpatient Hospital Stay (HOSPITAL_COMMUNITY): Payer: Medicare Other

## 2012-07-15 LAB — DIFFERENTIAL
Basophils Absolute: 0 10*3/uL (ref 0.0–0.1)
Eosinophils Relative: 0 % (ref 0–5)
Lymphs Abs: 1.2 10*3/uL (ref 0.7–4.0)
Monocytes Absolute: 1.2 10*3/uL — ABNORMAL HIGH (ref 0.1–1.0)
Neutrophils Relative %: 90 % — ABNORMAL HIGH (ref 43–77)

## 2012-07-15 LAB — CBC
HCT: 28.7 % — ABNORMAL LOW (ref 36.0–46.0)
Hemoglobin: 9.8 g/dL — ABNORMAL LOW (ref 12.0–15.0)
MCH: 28.1 pg (ref 26.0–34.0)
MCH: 28.8 pg (ref 26.0–34.0)
MCV: 82.2 fL (ref 78.0–100.0)
MCV: 82.5 fL (ref 78.0–100.0)
Platelets: 511 10*3/uL — ABNORMAL HIGH (ref 150–400)
RBC: 3.49 MIL/uL — ABNORMAL LOW (ref 3.87–5.11)
RBC: 3.54 MIL/uL — ABNORMAL LOW (ref 3.87–5.11)
RDW: 15.6 % — ABNORMAL HIGH (ref 11.5–15.5)
WBC: 20.7 10*3/uL — ABNORMAL HIGH (ref 4.0–10.5)
WBC: 23.5 10*3/uL — ABNORMAL HIGH (ref 4.0–10.5)

## 2012-07-15 LAB — COMPREHENSIVE METABOLIC PANEL
ALT: 13 U/L (ref 0–35)
AST: 15 U/L (ref 0–37)
Calcium: 8.9 mg/dL (ref 8.4–10.5)
Creatinine, Ser: 0.48 mg/dL — ABNORMAL LOW (ref 0.50–1.10)
GFR calc Af Amer: >90 mL/min (ref >90)
Sodium: 138 mEq/L (ref 135–145)
Total Protein: 6 g/dL (ref 6.0–8.3)

## 2012-07-15 LAB — MAGNESIUM: Magnesium: 1.8 mg/dL (ref 1.5–2.5)

## 2012-07-15 LAB — GLUCOSE, CAPILLARY
Glucose-Capillary: 127 mg/dL — ABNORMAL HIGH (ref 70–99)
Glucose-Capillary: 115 mg/dL — ABNORMAL HIGH (ref 70–99)

## 2012-07-15 LAB — PREALBUMIN: Prealbumin: <3 mg/dL — ABNORMAL LOW (ref 17.0–34.0)

## 2012-07-15 LAB — CHOLESTEROL, TOTAL: Cholesterol: 86 mg/dL (ref 0–200)

## 2012-07-15 LAB — TRIGLYCERIDES: Triglycerides: 118 mg/dL

## 2012-07-15 LAB — PHOSPHORUS: Phosphorus: 1.7 mg/dL — ABNORMAL LOW (ref 2.3–4.6)

## 2012-07-15 MED ORDER — POTASSIUM PHOSPHATE DIBASIC 3 MMOLE/ML IV SOLN
15.0000 mmol | Freq: Once | INTRAVENOUS | Status: AC
  Administered 2012-07-15: 15 mmol via INTRAVENOUS
  Filled 2012-07-15: qty 5

## 2012-07-15 MED ORDER — HYDROMORPHONE 0.3 MG/ML IV SOLN
INTRAVENOUS | Status: DC
  Administered 2012-07-15: 17:00:00 via INTRAVENOUS
  Administered 2012-07-15: 0.6 mg via INTRAVENOUS
  Administered 2012-07-16: 1.8 mg via INTRAVENOUS
  Administered 2012-07-16: 4.34 mg via INTRAVENOUS
  Administered 2012-07-16: 20:00:00 via INTRAVENOUS
  Administered 2012-07-16: 2.86 mg via INTRAVENOUS
  Administered 2012-07-16: 0.9 mg via INTRAVENOUS
  Administered 2012-07-16 (×2): 1.5 mg via INTRAVENOUS
  Administered 2012-07-17: 2.7 mg via INTRAVENOUS
  Administered 2012-07-17: 10:00:00 via INTRAVENOUS
  Administered 2012-07-17 (×2): 0.9 mg via INTRAVENOUS
  Administered 2012-07-17: 4.2 mg via INTRAVENOUS
  Administered 2012-07-17: 0.9 mg via INTRAVENOUS
  Administered 2012-07-17: 1.02 mg via INTRAVENOUS
  Administered 2012-07-18: 3.6 mg via INTRAVENOUS
  Administered 2012-07-18: 1.2 mg via INTRAVENOUS
  Administered 2012-07-18: 5.7 mg via INTRAVENOUS
  Administered 2012-07-18: 09:00:00 via INTRAVENOUS
  Administered 2012-07-18: 1.2 mg via INTRAVENOUS
  Administered 2012-07-18: 19:00:00 via INTRAVENOUS
  Administered 2012-07-19: 3.6 mg via INTRAVENOUS
  Administered 2012-07-19: 7.5 mg via INTRAVENOUS
  Administered 2012-07-19: 1.2 mg via INTRAVENOUS
  Administered 2012-07-19: 3.42 mg via INTRAVENOUS
  Administered 2012-07-19: 2.5 mg via INTRAVENOUS
  Administered 2012-07-20: 0.3 mg via INTRAVENOUS
  Administered 2012-07-20: 1.2 mg via INTRAVENOUS
  Filled 2012-07-15 (×9): qty 25

## 2012-07-15 MED ORDER — ONDANSETRON HCL 4 MG/2ML IJ SOLN: 4.0000 mg | Freq: Four times a day (QID) | INTRAMUSCULAR | Status: DC | PRN

## 2012-07-15 MED ORDER — POTASSIUM CHLORIDE 10 MEQ/100ML IV SOLN
10.0000 meq | INTRAVENOUS | Status: AC
  Administered 2012-07-15 (×2): 10 meq via INTRAVENOUS
  Filled 2012-07-15 (×2): qty 100

## 2012-07-15 MED ORDER — INSULIN ASPART 100 UNIT/ML ~~LOC~~ SOLN
0.0000 [IU] | SUBCUTANEOUS | Status: DC
  Administered 2012-07-15 (×2): 1 [IU] via SUBCUTANEOUS
  Administered 2012-07-15: 2 [IU] via SUBCUTANEOUS
  Administered 2012-07-16 – 2012-07-19 (×9): 1 [IU] via SUBCUTANEOUS

## 2012-07-15 MED ORDER — IOHEXOL 300 MG/ML  SOLN
50.0000 mL | Freq: Once | INTRAMUSCULAR | Status: AC | PRN
  Administered 2012-07-15: 100 mL

## 2012-07-15 MED ORDER — DIPHENHYDRAMINE HCL 12.5 MG/5ML PO ELIX: 12.5000 mg | ORAL_SOLUTION | Freq: Four times a day (QID) | ORAL | Status: DC | PRN

## 2012-07-15 MED ORDER — SODIUM CHLORIDE 0.9 % IJ SOLN: 9.0000 mL | INTRAMUSCULAR | Status: DC | PRN

## 2012-07-15 MED ORDER — CLINIMIX E/DEXTROSE (5/20) 5 % IV SOLN
INTRAVENOUS | Status: AC
  Administered 2012-07-15: 17:00:00 via INTRAVENOUS
  Filled 2012-07-15: qty 1000

## 2012-07-15 MED ORDER — DIPHENHYDRAMINE HCL 50 MG/ML IJ SOLN: 12.5000 mg | Freq: Four times a day (QID) | INTRAMUSCULAR | Status: DC | PRN

## 2012-07-15 MED ORDER — NALOXONE HCL 0.4 MG/ML IJ SOLN: 0.4000 mg | INTRAMUSCULAR | Status: DC | PRN

## 2012-07-15 NOTE — Progress Notes (Signed)
Called [facility] to talk to surgeon about NG Tube coming out after patient coughing, patient is now refusing to have NG Tube replaced, awaiting callback from MD Georgia Duff, Myrtie Hawk RN 07-15-2012 7:17pm

## 2012-07-15 NOTE — Progress Notes (Signed)
PARENTERAL NUTRITION CONSULT NOTE - FOLLOW UP  Pharmacy Consult for TNA Indication: Recurrent gastric perforation, prolonged NPO status  Allergies  Allergen Reactions  . Avelox (Moxifloxacin Hcl In Nacl) Nausea And Vomiting  . Alendronate Sodium Nausea And Vomiting  . Aspirin Nausea Only  . Codeine Nausea And Vomiting  . Doxycycline   . Fluconazole   . Hydrocodone Nausea And Vomiting    GI distress  . Neurontin (Gabapentin) Other (See Comments)    Mood changes   . Nsaids Other (See Comments)    Severe gastritis & perforation - avoid NSAIDs when possible  . Sertraline Hcl Other (See Comments)    Hallucinations   . Sulfa Antibiotics Rash    Patient Measurements: Height: 5' 4.96" (165 cm) (Documented 07/02/12) Weight: 97 lb 10.6 oz (44.3 kg) IBW/kg (Calculated) : 56.91 Usual Weight: ~44kg (stable)  Vital Signs: Temp: 98.3 F (36.8 C) (03/25 0622) Temp src: Oral (03/25 0622) BP: 150/68 mmHg (03/25 0622) Pulse Rate: 84 (03/25 0622) Intake/Output from previous day: 03/24 0701 - 03/25 0700 In: 1870 [I.V.:1820; IV Piggyback:50] Out: 2790 [Urine:2410; Emesis/NG output:200; Drains:180] Intake/Output from this shift:    Labs:  Recent Labs  07/13/12 0424 07/14/12 0326 07/15/12 0440  WBC 31.3* 30.4* 23.5*  HGB 11.4* 11.0* 10.2*  HCT 32.8* 31.3* 29.2*  PLT 448* 447* 511*     Recent Labs  07/13/12 0424 07/14/12 0326 07/15/12 0440  NA 135 137 138  K 2.8* 3.6 3.3*  CL 96 100 101  CO2 29 27 30   GLUCOSE 123* 82 171*  BUN 9 6 7   CREATININE 0.58 0.55 0.48*  CALCIUM 9.2 8.4 8.9  MG  --   --  1.8  PHOS  --   --  1.7*  PROT  --  5.6* 6.0  ALBUMIN  --  1.7* 1.8*  AST  --  28 15  ALT  --  18 13  ALKPHOS  --  71 65  BILITOT  --  0.3 0.2*   Estimated Creatinine Clearance: 49 ml/min (by C-G formula based on Cr of 0.48).    Medications:  Scheduled:  . [EXPIRED] 0.9 % NaCl with KCl 20 mEq / L      . enoxaparin (LOVENOX) injection  40 mg Subcutaneous Q24H  .  ertapenem  1 g Intravenous Q24H  . lip balm  1 application Topical BID  . pantoprazole (PROTONIX) IV  40 mg Intravenous Q12H  . potassium chloride  10 mEq Intravenous Q1 Hr x 2  . potassium phosphate IVPB (mmol)  15 mmol Intravenous Once  . [DISCONTINUED] piperacillin-tazobactam (ZOSYN)  IV  3.375 g Intravenous Q8H   Infusions:  . [EXPIRED] 0.9 % NaCl with KCl 20 mEq / L 100 mL/hr at 07/14/12 1747  . 0.9 % NaCl with KCl 20 mEq / L 60 mL/hr at 07/14/12 1800  . Marland KitchenTPN (CLINIMIX-E) Adult 40 mL/hr at 07/14/12 1833   And  . fat emulsion 250 mL (07/14/12 1833)    Insulin Requirements in the past 24 hours:  CBG range: 103-163 No SSI ordered yet  Current Nutrition:  NPO Clinimix E5/20 at 57ml/hr 20% Lipids at 36ml/hr  IVF: NS w/ 20KCl at 53ml/hr  Nutritional Goals:  RD recs (3/24): Kcal: 1400-1750, Protein: 66-80 grams, Fluid: 1.4-1.8 L Clinimix  5/20 at 65 ml/hr + Lipids MWF = 78 g protein/day and an average of 1579 kcal  Assessment: 67 y/o F recently hospitalized 3/12-3/14 for supraumbilical hernia repair and gastric biopsy, admitted with gastric perforation  s/p repair on 07/13/12. PICC is ordered for 3/24 with TPN to start for ongoing/prolonged NPO status and gastric leak. NG tube in place. Pt at risk for refeeding syndrome.  Glucose - one value elevated after starting TNA Electrolytes - K and Phos low, all others WNL LFTs - WNL, albumin low TGs - WNL (118) Prealbumin - pending  Plan: at 18:00 tonight  Continue Clinimix E5/20 at 6ml/hr - advance slowly due to risk of refeeding.  Fat emulsion at 10 ml/hr (MWF only due to ongoing shortage).   Standard multivitamins and trace elements (MWF only due to ongoing shortage).  Continue CBGs q8h, add sensitive scale SSI.  Potassium Phosphate IV x 1 today  Potassium Chloride IV x 2 runs today  Continue IVF at 32ml/hr  TNA lab panels on Mondays & Thursdays.  BMET, Phos in am   Loralee Pacas, PharmD,  BCPS Pager: [phone] 07/15/2012,7:07 AM

## 2012-07-15 NOTE — Progress Notes (Addendum)
2 Days Post-Op  Subjective: Patient is sleepy, but arousable. Some abdominal tenderness  Objective: Vital signs in last 24 hours: Temp:  [98.3 F (36.8 C)-98.8 F (37.1 C)] 98.3 F (36.8 C) (03/25 0622) Pulse Rate:  [84-87] 84 (03/25 0622) Resp:  [18] 18 (03/25 0622) BP: (139-150)/(61-70) 150/68 mmHg (03/25 0622) SpO2:  [91 %-98 %] 98 % (03/25 0622) Last BM Date: 07/12/12  Intake/Output from previous day: 03/24 0701 - 03/25 0700 In: 1870 [I.V.:1820; IV Piggyback:50] Out: 2790 [Urine:2410; Emesis/NG output:200; Drains:180] Intake/Output this shift:    General appearance: sleepy, but arousable GI: quiet, mildly distended Right-sided JP drain with bilious drainage - ?old fluid vs. recurrent leak?; left JP serous  Lab Results:   Recent Labs  07/14/12 0326 07/15/12 0440  WBC 30.4* 23.5*  HGB 11.0* 10.2*  HCT 31.3* 29.2*  PLT 447* 511*   BMET  Recent Labs  07/14/12 0326 07/15/12 0440  NA 137 138  K 3.6 3.3*  CL 100 101  CO2 27 30  GLUCOSE 82 171*  BUN 6 7  CREATININE 0.55 0.48*  CALCIUM 8.4 8.9   PT/INR No results found for this basename: LABPROT, INR,  in the last 72 hours ABG No results found for this basename: PHART, PCO2, PO2, HCO3,  in the last 72 hours  Studies/Results: No results found.  Anti-infectives: Anti-infectives   Start     Dose/Rate Route Frequency Ordered Stop   07/14/12 1000  ertapenem (INVANZ) 1 g in sodium chloride 0.9 % 50 mL IVPB     1 g 100 mL/hr over 30 Minutes Intravenous Every 24 hours 07/14/12 0856     07/09/12 1000  vancomycin (VANCOCIN) 750 mg in sodium chloride 0.9 % 150 mL IVPB  Status:  Discontinued     750 mg 150 mL/hr over 60 Minutes Intravenous Every 24 hours 07/08/12 0818 07/11/12 0801   07/08/12 1600  vancomycin (VANCOCIN) 750 mg in sodium chloride 0.9 % 150 mL IVPB  Status:  Discontinued     750 mg 150 mL/hr over 60 Minutes Intravenous Every 24 hours 07/07/12 1455 07/08/12 0818   07/08/12 0900  vancomycin  (VANCOCIN) IVPB 1000 mg/200 mL premix     1,000 mg 200 mL/hr over 60 Minutes Intravenous  Once 07/08/12 0818 07/08/12 1150   07/08/12 0200  piperacillin-tazobactam (ZOSYN) IVPB 3.375 g  Status:  Discontinued     3.375 g 12.5 mL/hr over 240 Minutes Intravenous Every 8 hours 07/07/12 1722 07/14/12 0856   07/07/12 1730  [MAR Hold]  piperacillin-tazobactam (ZOSYN) IVPB 3.375 g     (On MAR Hold since 07/07/12 1724)   3.375 g 100 mL/hr over 30 Minutes Intravenous To Surgery 07/07/12 1722 07/07/12 1737   07/07/12 1430  piperacillin-tazobactam (ZOSYN) IVPB 3.375 g  Status:  Discontinued     3.375 g 12.5 mL/hr over 240 Minutes Intravenous Every 8 hours 07/07/12 1404 07/07/12 1722   07/07/12 1430  vancomycin (VANCOCIN) IVPB 1000 mg/200 mL premix  Status:  Discontinued     1,000 mg 200 mL/hr over 60 Minutes Intravenous  Once 07/07/12 1410 07/10/12 1246      Assessment/Plan: s/p Procedure(s): EXPLORATORY LAPAROTOMY repair gastric leak (N/A) Gastrografin study today.  May need reexploration with formal partial gastrectomy, since repeated repair and omental patching does not seem to be able to heal Continue Invanz NPO Severe protein calorie malnutrition - continue TNA   LOS: 8 days    Barrington Worley K. 07/15/2012  Addendum:  My preliminary read on UGI shows recurrent  leak at anterior antrum  This leak seems to be controlled with the drain.  Further surgery would likely involve an antrectomy with gastrojejunostomy.  However, the patient's severe malnutrition is contributing to her recurrent leak.  She has now had three surgeries performed with good technique that ordinarily would heal any type of gastric perforation or full thickness biopsy.  Yet she continues to leak.  My plan is to keep her NPO and control the leak with the drain.  As her nutrition improves, this leak may seal on its own, or the patient would be in better shape to tolerate further surgery and heal an anastomosis or suture  line.  [name]. [name], MD, [facility] Surgery  07/15/2012 3:14 PM  Addendum:  The radiologist's reading of the UGI states that he sees no sign of leak.  This is good news.  The bilious drainage may be old fluid from her previous leak.  We will continue NPO and TNA for now.  We may restudy her next week to make sure that there is no further leak prior to removing her NG tube.  [name]. [name], MD, [facility] Surgery  07/15/2012 3:16 PM

## 2012-07-16 ENCOUNTER — Encounter: Payer: Medicare Other | Admitting: Internal Medicine

## 2012-07-16 LAB — BASIC METABOLIC PANEL
BUN: 8 mg/dL (ref 6–23)
CO2: 27 mEq/L (ref 19–32)
Glucose, Bld: 118 mg/dL — ABNORMAL HIGH (ref 70–99)
Potassium: 3.6 mEq/L (ref 3.5–5.1)
Sodium: 134 mEq/L — ABNORMAL LOW (ref 135–145)

## 2012-07-16 LAB — GLUCOSE, CAPILLARY
Glucose-Capillary: 110 mg/dL — ABNORMAL HIGH (ref 70–99)
Glucose-Capillary: 117 mg/dL — ABNORMAL HIGH (ref 70–99)

## 2012-07-16 MED ORDER — FAT EMULSION 20 % IV EMUL
250.0000 mL | INTRAVENOUS | Status: AC
  Administered 2012-07-16: 250 mL via INTRAVENOUS
  Filled 2012-07-16: qty 250

## 2012-07-16 MED ORDER — POTASSIUM CHLORIDE IN NACL 20-0.9 MEQ/L-% IV SOLN
INTRAVENOUS | Status: DC
  Administered 2012-07-17: 50 mL/h via INTRAVENOUS
  Filled 2012-07-16 (×2): qty 1000

## 2012-07-16 MED ORDER — TRACE MINERALS CR-CU-F-FE-I-MN-MO-SE-ZN IV SOLN
INTRAVENOUS | Status: AC
  Administered 2012-07-16: 18:00:00 via INTRAVENOUS
  Filled 2012-07-16: qty 1000

## 2012-07-16 NOTE — Progress Notes (Signed)
3 Days Post-Op  Subjective: Feeling better with PCA NG tube came out last night.  Objective: Vital signs in last 24 hours: Temp:  [98.5 F (36.9 C)-99.5 F (37.5 C)] 99.5 F (37.5 C) (03/26 0619) Pulse Rate:  [91-95] 91 (03/26 0619) Resp:  [16-24] 20 (03/26 0619) BP: (130-136)/(69-84) 130/69 mmHg (03/26 0619) SpO2:  [93 %-100 %] 100 % (03/26 0619) Last BM Date: 07/12/12  Intake/Output from previous day: 03/25 0701 - 03/26 0700 In: 2403 [I.V.:960; IV Piggyback:505; TPN:938] Out: 3020 [Urine:2650; Emesis/NG output:250; Drains:120] Intake/Output this shift:    General appearance: alert, cooperative and no distress Resp: clear to auscultation bilaterally GI: quiet, incisional tenderness Wound - granulating; some drainage R drain - some thin greenish-brown drainage;  L drain - minimal serous   Lab Results:   Recent Labs  07/15/12 0440 07/15/12 2340  WBC 23.5* 20.7*  HGB 10.2* 9.8*  HCT 29.2* 28.7*  PLT 511* 508*   BMET  Recent Labs  07/15/12 0440 07/16/12 0614  NA 138 134*  K 3.3* 3.6  CL 101 99  CO2 30 27  GLUCOSE 171* 118*  BUN 7 8  CREATININE 0.48* 0.49*  CALCIUM 8.9 8.9   PT/INR No results found for this basename: LABPROT, INR,  in the last 72 hours ABG No results found for this basename: PHART, PCO2, PO2, HCO3,  in the last 72 hours  Studies/Results: Dg Ugi W/water Sol Cm  07/15/2012  *RADIOLOGY REPORT*  Clinical Data:  History of recurrent gastric leakage post repair.  UPPER GI SERIES WITH KUB  Technique:100 ml of Omnipaque-300 was injected through the enteric tube filling the stomach with water soluble contrast.  The patient was evaluated in the supine, left lateral, right lateral, and oblique positions.  Fluoroscopy Time: 2.23 minutes  Comparison:  CT 07/14/2002  Findings: : Initial abdominal image shows residual contrast within the colon from prior CT.  Surgical drain is in place entering from the right.  After water soluble contrast was injected  into the stomach the patient was examined in multiple positions.  Contrast flowed from the stomach into the duodenum.  Gastroesophageal reflux was seen with contrast in the esophagus.  However, there is no leakage of contrast from the gastrointestinal tract.  No perforation was demonstrated.  IMPRESSION: No gastrointestinal perforation was demonstrated.   Original Report Authenticated By: Onalee Hua Call     Anti-infectives: Anti-infectives   Start     Dose/Rate Route Frequency Ordered Stop   07/14/12 1000  ertapenem (INVANZ) 1 g in sodium chloride 0.9 % 50 mL IVPB     1 g 100 mL/hr over 30 Minutes Intravenous Every 24 hours 07/14/12 0856     07/09/12 1000  vancomycin (VANCOCIN) 750 mg in sodium chloride 0.9 % 150 mL IVPB  Status:  Discontinued     750 mg 150 mL/hr over 60 Minutes Intravenous Every 24 hours 07/08/12 0818 07/11/12 0801   07/08/12 1600  vancomycin (VANCOCIN) 750 mg in sodium chloride 0.9 % 150 mL IVPB  Status:  Discontinued     750 mg 150 mL/hr over 60 Minutes Intravenous Every 24 hours 07/07/12 1455 07/08/12 0818   07/08/12 0900  vancomycin (VANCOCIN) IVPB 1000 mg/200 mL premix     1,000 mg 200 mL/hr over 60 Minutes Intravenous  Once 07/08/12 0818 07/08/12 1150   07/08/12 0200  piperacillin-tazobactam (ZOSYN) IVPB 3.375 g  Status:  Discontinued     3.375 g 12.5 mL/hr over 240 Minutes Intravenous Every 8 hours 07/07/12 1722 07/14/12  1610   07/07/12 1730  [MAR Hold]  piperacillin-tazobactam (ZOSYN) IVPB 3.375 g     (On MAR Hold since 07/07/12 1724)   3.375 g 100 mL/hr over 30 Minutes Intravenous To Surgery 07/07/12 1722 07/07/12 1737   07/07/12 1430  piperacillin-tazobactam (ZOSYN) IVPB 3.375 g  Status:  Discontinued     3.375 g 12.5 mL/hr over 240 Minutes Intravenous Every 8 hours 07/07/12 1404 07/07/12 1722   07/07/12 1430  vancomycin (VANCOCIN) IVPB 1000 mg/200 mL premix  Status:  Discontinued     1,000 mg 200 mL/hr over 60 Minutes Intravenous  Once 07/07/12 1410 07/10/12  1246      Assessment/Plan: s/p Procedure(s): EXPLORATORY LAPAROTOMY repair gastric leak (N/A) Severe protein calorie malnutrition contributing to repeated dehiscence of gastric repair/ patch Currently, no sign of recurrent leak Continue NPO x ice chips Continue antibiotics Continue TNA Ambulate D/C foley  LOS: 9 days    Tywana Robotham K. 07/16/2012

## 2012-07-16 NOTE — Progress Notes (Signed)
Reviewed and agree.

## 2012-07-16 NOTE — Progress Notes (Signed)
Physical Therapy Treatment Patient Details Name: Cynthia Bowen MRN: [id number] DOB: 07/15/1945 Today's Date: 07/16/2012 Time: 7829-5621 PT Time Calculation (min): 27 min  PT Assessment / Plan / Recommendation Comments on Treatment Session  Pt progressing slowly and still requires + 2 assist for safety and equipment.    Follow Up Recommendations  Home health PT;SNF;Supervision/Assistance - 24 hour (family support)     Does the patient have the potential to tolerate intense rehabilitation     Barriers to Discharge        Equipment Recommendations  Rolling walker with 5" wheels    Recommendations for Other Services    Frequency Min 3X/week   Plan      Precautions / Restrictions Precautions Precaution Comments: ABD incision and 2 drains Restrictions Weight Bearing Restrictions: No   Pertinent Vitals/Pain C/o 9/10 ABD pain during act PCA x 1    Mobility  Bed Mobility Bed Mobility: Supine to Sit;Sitting - Scoot to Edge of Bed Supine to Sit: 1: +2 Total assist Supine to Sit: Patient Percentage: 50% Sitting - Scoot to Edge of Bed: 1: +2 Total assist Sitting - Scoot to Edge of Bed: Patient Percentage: 30% Details for Bed Mobility Assistance: HOB elevated 45' and increased time due ABD incision and c/o 9/10 pain with act Transfers Transfers: Sit to Stand;Stand to Sit Sit to Stand: 1: +2 Total assist Sit to Stand: Patient Percentage: 90% Stand to Sit: 1: +2 Total assist Stand to Sit: Patient Percentage: 90% Details for Transfer Assistance: 50% VC's on proper tech and hand placement esp with stand to sit to controll decend. Ambulation/Gait Ambulation/Gait Assistance: 1: +2 Total assist Ambulation/Gait: Patient Percentage: 80% Ambulation Distance (Feet): 80 Feet (40' x 2 sitting rest break) Assistive device: Rolling walker Ambulation/Gait Assistance Details: + 2 assist for safety/lines/equipment/chair to follow.  75% VC's on deep breathing as sats decreased to 84% on 2 lts.   Pt c/o 9/10 ABD pain with amb.  Pt requires MAX encouragement to increase amb distance. Gait Pattern: Step-through pattern;Decreased step length - right;Decreased step length - left;Decreased stride length Gait velocity: decreased     PT Goals                                      progressing    Visit Information  Last PT Received On: 07/16/12 Assistance Needed: +2 (equipment/chair)    Subjective Data      Cognition    good   Balance   poor  End of Session PT - End of Session Equipment Utilized During Treatment: Gait belt Activity Tolerance: Patient limited by pain;Patient limited by fatigue Patient left: in chair;with call bell/phone within reach   Felecia Shelling  PTA [facility]  Acute  Rehab Pager      [phone]

## 2012-07-16 NOTE — Progress Notes (Signed)
PARENTERAL NUTRITION CONSULT NOTE - FOLLOW UP  Pharmacy Consult for TNA Indication: Recurrent gastric perforation, prolonged NPO status  Allergies  Allergen Reactions  . Avelox (Moxifloxacin Hcl In Nacl) Nausea And Vomiting  . Alendronate Sodium Nausea And Vomiting  . Aspirin Nausea Only  . Codeine Nausea And Vomiting  . Doxycycline   . Fluconazole   . Hydrocodone Nausea And Vomiting    GI distress  . Neurontin (Gabapentin) Other (See Comments)    Mood changes   . Nsaids Other (See Comments)    Severe gastritis & perforation - avoid NSAIDs when possible  . Sertraline Hcl Other (See Comments)    Hallucinations   . Sulfa Antibiotics Rash    Patient Measurements: Height: 5' 4.96" (165 cm) (Documented 07/02/12) Weight: 97 lb 10.6 oz (44.3 kg) IBW/kg (Calculated) : 56.91 Usual Weight: ~44kg (stable)  Vital Signs: Temp: 99.5 F (37.5 C) (03/26 0619) Temp src: Oral (03/26 0619) BP: 130/69 mmHg (03/26 0619) Pulse Rate: 91 (03/26 0619) Intake/Output from previous day: 03/25 0701 - 03/26 0700 In: 2403 [I.V.:960; IV Piggyback:505; TPN:938] Out: 3020 [Urine:2650; Emesis/NG output:250; Drains:120] Intake/Output from this shift:    Labs:  Recent Labs  07/14/12 0326 07/15/12 0440 07/15/12 2340  WBC 30.4* 23.5* 20.7*  HGB 11.0* 10.2* 9.8*  HCT 31.3* 29.2* 28.7*  PLT 447* 511* 508*     Recent Labs  07/14/12 0326 07/15/12 0440 07/16/12 0614  NA 137 138 134*  K 3.6 3.3* 3.6  CL 100 101 99  CO2 27 30 27   GLUCOSE 82 171* 118*  BUN 6 7 8   CREATININE 0.55 0.48* 0.49*  CALCIUM 8.4 8.9 8.9  MG  --  1.8  --   PHOS  --  1.7* 2.9  PROT 5.6* 6.0  --   ALBUMIN 1.7* 1.8*  --   AST 28 15  --   ALT 18 13  --   ALKPHOS 71 65  --   BILITOT 0.3 0.2*  --   PREALBUMIN  --  <3.0*  --   TRIG  --  118  --   CHOL  --  86  --   Corrected Ca 10.66 (3/25) Estimated Creatinine Clearance: 49 ml/min (by C-G formula based on Cr of 0.49).    Medications:  Scheduled:  . enoxaparin  (LOVENOX) injection  40 mg Subcutaneous Q24H  . ertapenem  1 g Intravenous Q24H  . HYDROmorphone PCA 0.3 mg/mL   Intravenous Q4H  . insulin aspart  0-9 Units Subcutaneous Q4H  . lip balm  1 application Topical BID  . pantoprazole (PROTONIX) IV  40 mg Intravenous Q12H  . [COMPLETED] potassium chloride  10 mEq Intravenous Q1 Hr x 2  . [COMPLETED] potassium phosphate IVPB (mmol)  15 mmol Intravenous Once   Infusions:  . 0.9 % NaCl with KCl 20 mEq / L 60 mL/hr at 07/14/12 1800  . [EXPIRED] .TPN (CLINIMIX-E) Adult 40 mL/hr at 07/14/12 1833   And  . [EXPIRED] fat emulsion 250 mL (07/14/12 1833)  . Marland KitchenTPN (CLINIMIX-E) Adult 40 mL/hr at 07/15/12 1705    Insulin Requirements in the past 24 hours:  CBG range: 110-149 5 units SSI sensitive scale  Current Nutrition:  NPO Clinimix E5/20 at 92ml/hr 20% Lipids at 7ml/hr  IVF: NS w/ 20KCl at 30ml/hr  Nutritional Goals:  RD recs (3/24): Kcal: 1400-1750, Protein: 66-80 grams, Fluid: 1.4-1.8 L Clinimix  5/20 at 65 ml/hr + Lipids MWF = 78 g protein/day and an average of 1579 kcal  Assessment: 67 y/o F recently hospitalized 3/12-3/14 for supraumbilical hernia repair and gastric biopsy, admitted with gastric perforation s/p repair on 07/13/12. PICC is ordered for 3/24 with TPN to start for ongoing/prolonged NPO status and gastric leak. NG tube in place. Pt at risk for refeeding syndrome.  Glucose: at goal < 150, improved with SSI Electrolytes: Na slightly low, K and Phos improved after replacement, Corr Ca slightly elevated (unable to adjust electrolytes in premixed TNA) LFTs: WNL, albumin low TGs: WNL (118) Prealbumin: severely low, <3 (3/25)  3/26: D3 TNA, appears to be tolerating TNA initiation. Remains NPO, NGT out, pt unwilling to replace at this time. Gastrografin study 3/25 shows no perforation.   Plan: at 18:00 tonight  Advance Clinimix E5/20 to 6ml/hr - advance slowly due to risk of refeeding.  Fat emulsion at 10 ml/hr (MWF only due  to ongoing shortage).   Standard multivitamins and trace elements (MWF only due to ongoing shortage). Pt currently unable to take PO MVI at this time.  Continue CBGs q8h, with sensitive scale SSI.  Decrease IVF at 12ml/hr  TNA lab panels on Mondays & Thursdays.  Follow up ability to advance diet, wean/dc TNA   Loralee Pacas, PharmD, BCPS Pager: [phone] 07/16/2012,7:07 AM

## 2012-07-17 LAB — GLUCOSE, CAPILLARY
Glucose-Capillary: 109 mg/dL — ABNORMAL HIGH (ref 70–99)
Glucose-Capillary: 118 mg/dL — ABNORMAL HIGH (ref 70–99)
Glucose-Capillary: 126 mg/dL — ABNORMAL HIGH (ref 70–99)

## 2012-07-17 LAB — MAGNESIUM: Magnesium: 1.8 mg/dL (ref 1.5–2.5)

## 2012-07-17 LAB — COMPREHENSIVE METABOLIC PANEL
BUN: 11 mg/dL (ref 6–23)
CO2: 28 mEq/L (ref 19–32)
Chloride: 100 mEq/L (ref 96–112)
Creatinine, Ser: 0.6 mg/dL (ref 0.50–1.10)
GFR calc Af Amer: >90 mL/min (ref >90)
GFR calc non Af Amer: >90 mL/min (ref >90)
Glucose, Bld: 108 mg/dL — ABNORMAL HIGH (ref 70–99)
Total Bilirubin: 0.2 mg/dL — ABNORMAL LOW (ref 0.3–1.2)

## 2012-07-17 LAB — PHOSPHORUS: Phosphorus: 3.2 mg/dL (ref 2.3–4.6)

## 2012-07-17 MED ORDER — DEXTROSE 10 % IV SOLN
INTRAVENOUS | Status: DC
  Administered 2012-07-17: 16:00:00 via INTRAVENOUS
  Filled 2012-07-17: qty 1000

## 2012-07-17 MED ORDER — POTASSIUM CHLORIDE IN NACL 20-0.9 MEQ/L-% IV SOLN
INTRAVENOUS | Status: DC
  Administered 2012-07-18 – 2012-07-23 (×4): via INTRAVENOUS
  Administered 2012-07-24: 35 mL/h via INTRAVENOUS
  Administered 2012-07-25: 13:00:00 via INTRAVENOUS
  Filled 2012-07-17 (×11): qty 1000

## 2012-07-17 MED ORDER — CLINIMIX E/DEXTROSE (5/20) 5 % IV SOLN
INTRAVENOUS | Status: AC
  Administered 2012-07-17: 17:00:00 via INTRAVENOUS
  Filled 2012-07-17: qty 2000

## 2012-07-17 NOTE — Progress Notes (Signed)
4 Days Post-Op  Subjective: Patient feeling better - still using PCA frequently Very weak when working with PT Voiding well 2 bowel movements  Right drain output - less green; more tan/ brown - serous Objective: Vital signs in last 24 hours: Temp:  [97.3 F (36.3 C)-99.5 F (37.5 C)] 97.3 F (36.3 C) (03/27 0600) Pulse Rate:  [93-100] 100 (03/27 0600) Resp:  [11-19] 12 (03/27 0752) BP: (115-125)/(65-81) 115/81 mmHg (03/27 0600) SpO2:  [97 %-100 %] 98 % (03/27 0752) FiO2 (%):  [31 %] 31 % (03/27 0752) Last BM Date: 07/12/12  Intake/Output from previous day: 03/26 0701 - 03/27 0700 In: 1200 [I.V.:1200] Out: 1910 [Urine:1800; Drains:110] Intake/Output this shift:    General appearance: alert, cooperative and no distress Resp: clear to auscultation bilaterally GI: occasional bowel sounds; incisional tenderness Wound - granulating; some drainage in incision  Lab Results:   Recent Labs  07/15/12 0440 07/15/12 2340  WBC 23.5* 20.7*  HGB 10.2* 9.8*  HCT 29.2* 28.7*  PLT 511* 508*   BMET  Recent Labs  07/16/12 0614 07/17/12 0428  NA 134* 135  K 3.6 4.0  CL 99 100  CO2 27 28  GLUCOSE 118* 108*  BUN 8 11  CREATININE 0.49* 0.60  CALCIUM 8.9 8.9   PT/INR No results found for this basename: LABPROT, INR,  in the last 72 hours ABG No results found for this basename: PHART, PCO2, PO2, HCO3,  in the last 72 hours  Studies/Results: Dg Ugi W/water Sol Cm  07/15/2012  *RADIOLOGY REPORT*  Clinical Data:  History of recurrent gastric leakage post repair.  UPPER GI SERIES WITH KUB  Technique:100 ml of Omnipaque-300 was injected through the enteric tube filling the stomach with water soluble contrast.  The patient was evaluated in the supine, left lateral, right lateral, and oblique positions.  Fluoroscopy Time: 2.23 minutes  Comparison:  CT 07/14/2002  Findings: : Initial abdominal image shows residual contrast within the colon from prior CT.  Surgical drain is in place  entering from the right.  After water soluble contrast was injected into the stomach the patient was examined in multiple positions.  Contrast flowed from the stomach into the duodenum.  Gastroesophageal reflux was seen with contrast in the esophagus.  However, there is no leakage of contrast from the gastrointestinal tract.  No perforation was demonstrated.  IMPRESSION: No gastrointestinal perforation was demonstrated.   Original Report Authenticated By: Onalee Hua Call     Anti-infectives: Anti-infectives   Start     Dose/Rate Route Frequency Ordered Stop   07/14/12 1000  ertapenem (INVANZ) 1 g in sodium chloride 0.9 % 50 mL IVPB     1 g 100 mL/hr over 30 Minutes Intravenous Every 24 hours 07/14/12 0856     07/09/12 1000  vancomycin (VANCOCIN) 750 mg in sodium chloride 0.9 % 150 mL IVPB  Status:  Discontinued     750 mg 150 mL/hr over 60 Minutes Intravenous Every 24 hours 07/08/12 0818 07/11/12 0801   07/08/12 1600  vancomycin (VANCOCIN) 750 mg in sodium chloride 0.9 % 150 mL IVPB  Status:  Discontinued     750 mg 150 mL/hr over 60 Minutes Intravenous Every 24 hours 07/07/12 1455 07/08/12 0818   07/08/12 0900  vancomycin (VANCOCIN) IVPB 1000 mg/200 mL premix     1,000 mg 200 mL/hr over 60 Minutes Intravenous  Once 07/08/12 0818 07/08/12 1150   07/08/12 0200  piperacillin-tazobactam (ZOSYN) IVPB 3.375 g  Status:  Discontinued  3.375 g 12.5 mL/hr over 240 Minutes Intravenous Every 8 hours 07/07/12 1722 07/14/12 0856   07/07/12 1730  [MAR Hold]  piperacillin-tazobactam (ZOSYN) IVPB 3.375 g     (On MAR Hold since 07/07/12 1724)   3.375 g 100 mL/hr over 30 Minutes Intravenous To Surgery 07/07/12 1722 07/07/12 1737   07/07/12 1430  piperacillin-tazobactam (ZOSYN) IVPB 3.375 g  Status:  Discontinued     3.375 g 12.5 mL/hr over 240 Minutes Intravenous Every 8 hours 07/07/12 1404 07/07/12 1722   07/07/12 1430  vancomycin (VANCOCIN) IVPB 1000 mg/200 mL premix  Status:  Discontinued     1,000  mg 200 mL/hr over 60 Minutes Intravenous  Once 07/07/12 1410 07/10/12 1246      Assessment/Plan: s/p Procedure(s): EXPLORATORY LAPAROTOMY repair gastric leak (N/A) Recurrent gastric leak Severe protein calorie malnutrition  Continue ice chips only - may restart sips of liquids tomorrow if she continues to do well TNA  Place wound VAC on midline wound Leave drains for now  Continue PT  Will probably need home health PT and home health nursing for dressing changes.  I doubt she will need the VAC at home.   LOS: 10 days    Cynthia Longbottom K. 07/17/2012

## 2012-07-17 NOTE — Progress Notes (Signed)
PARENTERAL NUTRITION CONSULT NOTE - FOLLOW UP  Pharmacy Consult for TNA Indication: Recurrent gastric perforation, prolonged NPO status  Allergies  Allergen Reactions  . Avelox (Moxifloxacin Hcl In Nacl) Nausea And Vomiting  . Alendronate Sodium Nausea And Vomiting  . Aspirin Nausea Only  . Codeine Nausea And Vomiting  . Doxycycline   . Fluconazole   . Hydrocodone Nausea And Vomiting    GI distress  . Neurontin (Gabapentin) Other (See Comments)    Mood changes   . Nsaids Other (See Comments)    Severe gastritis & perforation - avoid NSAIDs when possible  . Sertraline Hcl Other (See Comments)    Hallucinations   . Sulfa Antibiotics Rash    Patient Measurements: Height: 5' 4.96" (165 cm) (Documented 07/02/12) Weight: 97 lb 10.6 oz (44.3 kg) IBW/kg (Calculated) : 56.91 Usual Weight: ~44kg (stable)  Vital Signs: Temp: 99.4 F (37.4 C) (03/26 2217) Temp src: Oral (03/26 2217) BP: 117/65 mmHg (03/26 2217) Pulse Rate: 93 (03/26 2217) Intake/Output from previous day: 03/26 0701 - 03/27 0700 In: 1200 [I.V.:1200] Out: 1710 [Urine:1600; Drains:110] Intake/Output from this shift:    Labs:  Recent Labs  07/15/12 0440 07/15/12 2340  WBC 23.5* 20.7*  HGB 10.2* 9.8*  HCT 29.2* 28.7*  PLT 511* 508*     Recent Labs  07/15/12 0440 07/16/12 0614 07/17/12 0428  NA 138 134* 135  K 3.3* 3.6 4.0  CL 101 99 100  CO2 30 27 28   GLUCOSE 171* 118* 108*  BUN 7 8 11   CREATININE 0.48* 0.49* 0.60  CALCIUM 8.9 8.9 8.9  MG 1.8  --  1.8  PHOS 1.7* 2.9 3.2  PROT 6.0  --  6.7  ALBUMIN 1.8*  --  1.8*  AST 15  --  14  ALT 13  --  10  ALKPHOS 65  --  78  BILITOT 0.2*  --  0.2*  PREALBUMIN <3.0*  --   --   TRIG 118  --   --   CHOL 86  --   --   Corrected Ca 10.66 (3/25) Estimated Creatinine Clearance: 49 ml/min (by C-G formula based on Cr of 0.6).    Medications:  Scheduled:  . enoxaparin (LOVENOX) injection  40 mg Subcutaneous Q24H  . ertapenem  1 g Intravenous Q24H  .  HYDROmorphone PCA 0.3 mg/mL   Intravenous Q4H  . insulin aspart  0-9 Units Subcutaneous Q4H  . lip balm  1 application Topical BID  . pantoprazole (PROTONIX) IV  40 mg Intravenous Q12H   Infusions:  . [EXPIRED] 0.9 % NaCl with KCl 20 mEq / L 50 mL/hr at 07/16/12 1801  . 0.9 % NaCl with KCl 20 mEq / L 50 mL/hr (07/17/12 0459)  . fat emulsion 250 mL (07/16/12 1734)  . [EXPIRED] .TPN (CLINIMIX-E) Adult 40 mL/hr at 07/15/12 1705  . Marland KitchenTPN (CLINIMIX-E) Adult 50 mL/hr at 07/16/12 1735    Insulin Requirements in the past 24 hours:  CBG range: 109-130 2 units SSI sensitive scale  Current Nutrition:  NPO Clinimix E5/20 at 11ml/hr 20% Lipids at 44ml/hr on MWF  IVF: NS w/ 20KCl at 51ml/hr  Nutritional Goals:  RD recs (3/24): Kcal: 1400-1750, Protein: 66-80 grams, Fluid: 1.4-1.8 L Clinimix  5/20 at 65 ml/hr + Lipids MWF = 78 g protein/day and an average of 1579 kcal  Assessment: 67 y/o F recently hospitalized 3/12-3/14 for supraumbilical hernia repair and gastric biopsy, admitted with gastric perforation s/p repair on 07/13/12. PICC ordered 3/24 with  TPN to start for ongoing/prolonged NPO status and gastric leak. NG tube in place. TNA has advanced slowly due to risk for refeeding syndrome.   Glucose: at goal < 150, improved with SSI Electrolytes: Lytes WNL, Corr Ca slightly elevated (unable to adjust electrolytes in premixed TNA) Renal:  SCr stable, CrCL 49 ml/min LFTs: WNL, albumin low TGs: WNL (118) Prealbumin: severely low, <3 (3/25)  3/27: D4 TNA, appears to be tolerating TNA. Remains NPO, NGT out, pt unwilling to replace at this time. Gastrografin study 3/25 shows no perforation.   Plan: at 18:00 tonight  Advance Clinimix E5/20 to 20ml/hr   Fat emulsion at 10 ml/hr (MWF only due to ongoing shortage).   Standard multivitamins and trace elements (MWF only due to ongoing shortage). Pt currently unable to take PO MVI at this time.  Continue CBGs q8h, with sensitive scale  SSI.  Decrease IVF at 63ml/hr  TNA lab panels on Mondays & Thursdays.  Follow up ability to advance diet, wean/dc TNA  Haynes Hoehn, PharmD 07/17/2012 9:12 AM  Pager: [phone]

## 2012-07-18 LAB — GLUCOSE, CAPILLARY
Glucose-Capillary: 122 mg/dL — ABNORMAL HIGH (ref 70–99)
Glucose-Capillary: 101 mg/dL — ABNORMAL HIGH (ref 70–99)
Glucose-Capillary: 134 mg/dL — ABNORMAL HIGH (ref 70–99)

## 2012-07-18 LAB — BASIC METABOLIC PANEL
BUN: 12 mg/dL (ref 6–23)
CO2: 29 mEq/L (ref 19–32)
Glucose, Bld: 113 mg/dL — ABNORMAL HIGH (ref 70–99)
Potassium: 4 mEq/L (ref 3.5–5.1)
Sodium: 133 mEq/L — ABNORMAL LOW (ref 135–145)

## 2012-07-18 LAB — CBC
HCT: 26.7 % — ABNORMAL LOW (ref 36.0–46.0)
Hemoglobin: 9.4 g/dL — ABNORMAL LOW (ref 12.0–15.0)
MCH: 28.9 pg (ref 26.0–34.0)
RBC: 3.25 MIL/uL — ABNORMAL LOW (ref 3.87–5.11)

## 2012-07-18 MED ORDER — TRACE MINERALS CR-CU-F-FE-I-MN-MO-SE-ZN IV SOLN
INTRAVENOUS | Status: AC
  Administered 2012-07-18: 18:00:00 via INTRAVENOUS
  Filled 2012-07-18: qty 2000

## 2012-07-18 MED ORDER — FAT EMULSION 20 % IV EMUL
250.0000 mL | INTRAVENOUS | Status: AC
  Administered 2012-07-18: 250 mL via INTRAVENOUS
  Filled 2012-07-18: qty 250

## 2012-07-18 MED ORDER — ENOXAPARIN SODIUM 30 MG/0.3ML ~~LOC~~ SOLN
30.0000 mg | SUBCUTANEOUS | Status: DC
  Administered 2012-07-18 – 2012-07-25 (×8): 30 mg via SUBCUTANEOUS
  Filled 2012-07-18 (×9): qty 0.3

## 2012-07-18 NOTE — Progress Notes (Signed)
Physical Therapy Treatment Patient Details Name: Cynthia Bowen MRN: [id number] DOB: 1946/01/26 Today's Date: 07/18/2012 Time: 5366-4403 PT Time Calculation (min): 31 min  PT Assessment / Plan / Recommendation Comments on Treatment Session  Pt progressing slowly and still requires + 2 assist for safety and equipment.    Follow Up Recommendations  Home health PT;SNF;Supervision/Assistance - 24 hour     Does the patient have the potential to tolerate intense rehabilitation     Barriers to Discharge        Equipment Recommendations  Rolling walker with 5" wheels    Recommendations for Other Services    Frequency     Plan      Precautions / Restrictions Precautions Precautions: Fall Precaution Comments: ABD incision, 2 drains, wound VAC and O2 Restrictions Weight Bearing Restrictions: No   Pertinent Vitals/Pain C/o mild dizzyness and mod ABD pain with activity Pt declined use of PCA    Mobility  Bed Mobility Bed Mobility: Not assessed Details for Bed Mobility Assistance: Pt OOB in recliner Transfers Transfers: Sit to Stand;Stand to Sit Sit to Stand: 1: +2 Total assist Sit to Stand: Patient Percentage: 90% Stand to Sit: 1: +2 Total assist Stand to Sit: Patient Percentage: 90% Details for Transfer Assistance: 50% VC's on proper tech and hand placement esp with stand to sit to controll decend. Ambulation/Gait Ambulation/Gait Assistance: 1: +2 Total assist Ambulation/Gait: Patient Percentage: 90% Ambulation Distance (Feet): 250 Feet (125' x 2 one sitting rest break) Assistive device: Rolling walker Ambulation/Gait Assistance Details: + 2 assist for safety due to multiple lines/tubing and chair to follow for safety.  Amb pt twice with one sitting rest break.  Pt on 2 lts )2 avg sats 89% and HR 77.  Pt tolerated session well with only mild c/o fatigue. Gait Pattern: Step-through pattern;Decreased step length - right;Decreased step length - left;Decreased stride length Gait  velocity: decreased      PT Goals                                                       progressing    Visit Information  Last PT Received On: 07/18/12 Assistance Needed: +2 (equipment/O2/chair)    Subjective Data   I want to get better   Cognition    good   Balance   fair +  End of Session PT - End of Session Equipment Utilized During Treatment: Gait belt;Oxygen Activity Tolerance: Patient limited by pain;Patient limited by fatigue Patient left: in chair;with call bell/phone within reach   Felecia Shelling  PTA [facility]  Acute  Rehab Pager      [phone]

## 2012-07-18 NOTE — Progress Notes (Signed)
NUTRITION FOLLOW UP  Intervention:   - Diet advancement per MD - TPN per pharmacy - Will continue to monitor   Nutrition Dx:   Inadequate oral intake related to inability to eat as evidenced by NPO; ongoing  Goal:   Pt to meet >/= 90% of their estimated nutrition needs - met  Monitor:   Weights, labs, TPN, diet advancement, BM  Assessment:   NGT came out 3/25 and was not replaced. UGI on 3/25 showed no sign of leak per radiologist. Met with pt who denies any nausea and reports having 2 BMs.   - Sodium slightly low - CBGs < 150mg /dL - PALB < 3 mg/dL on 1/61  TPN: Clinimix E 5/20 @ 65 ml/hr.  Lipids (20% IVFE @ 10 ml/hr), multivitamins, and trace elements are provided 3 times weekly (MWF) due to national backorder.  Provides 1579 kcal and 78 grams protein daily (based on weekly average).  Meets 113% minimum estimated kcal and 118% minimum estimated protein needs.  Additional IVF with NS @ 35 ml/hr.  Height: Ht Readings from Last 1 Encounters:  07/07/12 5' 4.96" (1.65 m)    Weight Status:   Wt Readings from Last 1 Encounters:  07/14/12 97 lb 10.6 oz (44.3 kg)    Re-estimated needs:  Kcal: 1400-1750  Protein: 66-80 grams  Fluid: 1.4-1.8 L   Skin: Abdominal incision, JP drain, abdominal drain, wound VAC  Diet Order: NPO   Intake/Output Summary (Last 24 hours) at 07/18/12 1115 Last data filed at 07/18/12 1000  Gross per 24 hour  Intake 1516.92 ml  Output   2082 ml  Net -565.08 ml    Last BM: 3/27   Labs:   Recent Labs Lab 07/14/12 0326 07/15/12 0440 07/16/12 0614 07/17/12 0428 07/18/12 0815  NA 137 138 134* 135 133*  K 3.6 3.3* 3.6 4.0 4.0  CL 100 101 99 100 98  CO2 27 30 27 28 29   BUN 6 7 8 11 12   CREATININE 0.55 0.48* 0.49* 0.60 0.55  CALCIUM 8.4 8.9 8.9 8.9 9.0  MG  --  1.8  --  1.8  --   PHOS  --  1.7* 2.9 3.2  --   GLUCOSE 82 171* 118* 108* 113*    CBG (last 3)   Recent Labs  07/17/12 2057 07/17/12 2323 07/18/12 0338  GLUCAP 110*  126* 122*    Scheduled Meds: . enoxaparin (LOVENOX) injection  30 mg Subcutaneous Q24H  . ertapenem  1 g Intravenous Q24H  . HYDROmorphone PCA 0.3 mg/mL   Intravenous Q4H  . insulin aspart  0-9 Units Subcutaneous Q4H  . lip balm  1 application Topical BID  . pantoprazole (PROTONIX) IV  40 mg Intravenous Q12H    Continuous Infusions: . Marland KitchenTPN (CLINIMIX-E) Adult 65 mL/hr at 07/17/12 1710  . Marland KitchenTPN (CLINIMIX-E) Adult    . 0.9 % NaCl with KCl 20 mEq / L 35 mL/hr at 07/18/12 0727  . dextrose 50 mL/hr at 07/17/12 1620  . fat emulsion       Levon Hedger MS, RD, [place] [phone] Pager [phone] After Hours Pager

## 2012-07-18 NOTE — Progress Notes (Signed)
Spoke with patient to follow up about whether or not she was interested in [facility] Care Management services. Patient reports she does not know what she needs at discharge and appeared to be distracted during visit. Explained services again to patient and left [facility] Care Management brochure with patient. Will come back to visit again next week to see if patient has made a decision. Appears patient will go home with home health when medically stable.   Raiford Noble, MSN- Ed, RN,BSN- [facility] [phone]

## 2012-07-18 NOTE — Progress Notes (Signed)
5 Days Post-Op  Subjective: Patient is feeling somewhat better, still weak. Working with physical therapy - will need HH PT upon discharge - ordered, along with rolling walker for home VAC placed yesterday. Right JP - still with some greenish cloudy fluid; left JP serous Two BM  Objective: Vital signs in last 24 hours: Temp:  [98.7 F (37.1 C)-99.6 F (37.6 C)] 98.7 F (37.1 C) (03/27 2108) Pulse Rate:  [87-97] 87 (03/27 2108) Resp:  [11-20] 14 (03/28 0405) BP: (116-131)/(67-76) 116/76 mmHg (03/27 2108) SpO2:  [97 %-100 %] 100 % (03/28 0405) Last BM Date: 07/16/12  Intake/Output from previous day: 03/27 0701 - 03/28 0700 In: 3656.2 [I.V.:1341.7; IV Piggyback:100; TPN:2214.6] Out: 1802 [Urine:1700; Drains:102] Intake/Output this shift:    General appearance: alert, cooperative and no distress Resp: clear to auscultation bilaterally GI: soft, non-distended; incisional tenderness; some bowel sounds VAC with good seal  Lab Results:   Recent Labs  07/15/12 2340  WBC 20.7*  HGB 9.8*  HCT 28.7*  PLT 508*   BMET  Recent Labs  07/16/12 0614 07/17/12 0428  NA 134* 135  K 3.6 4.0  CL 99 100  CO2 27 28  GLUCOSE 118* 108*  BUN 8 11  CREATININE 0.49* 0.60  CALCIUM 8.9 8.9   PT/INR No results found for this basename: LABPROT, INR,  in the last 72 hours ABG No results found for this basename: PHART, PCO2, PO2, HCO3,  in the last 72 hours  Studies/Results: No results found.  Anti-infectives: Anti-infectives   Start     Dose/Rate Route Frequency Ordered Stop   07/14/12 1000  ertapenem (INVANZ) 1 g in sodium chloride 0.9 % 50 mL IVPB     1 g 100 mL/hr over 30 Minutes Intravenous Every 24 hours 07/14/12 0856     07/09/12 1000  vancomycin (VANCOCIN) 750 mg in sodium chloride 0.9 % 150 mL IVPB  Status:  Discontinued     750 mg 150 mL/hr over 60 Minutes Intravenous Every 24 hours 07/08/12 0818 07/11/12 0801   07/08/12 1600  vancomycin (VANCOCIN) 750 mg in sodium  chloride 0.9 % 150 mL IVPB  Status:  Discontinued     750 mg 150 mL/hr over 60 Minutes Intravenous Every 24 hours 07/07/12 1455 07/08/12 0818   07/08/12 0900  vancomycin (VANCOCIN) IVPB 1000 mg/200 mL premix     1,000 mg 200 mL/hr over 60 Minutes Intravenous  Once 07/08/12 0818 07/08/12 1150   07/08/12 0200  piperacillin-tazobactam (ZOSYN) IVPB 3.375 g  Status:  Discontinued     3.375 g 12.5 mL/hr over 240 Minutes Intravenous Every 8 hours 07/07/12 1722 07/14/12 0856   07/07/12 1730  [MAR Hold]  piperacillin-tazobactam (ZOSYN) IVPB 3.375 g     (On MAR Hold since 07/07/12 1724)   3.375 g 100 mL/hr over 30 Minutes Intravenous To Surgery 07/07/12 1722 07/07/12 1737   07/07/12 1430  piperacillin-tazobactam (ZOSYN) IVPB 3.375 g  Status:  Discontinued     3.375 g 12.5 mL/hr over 240 Minutes Intravenous Every 8 hours 07/07/12 1404 07/07/12 1722   07/07/12 1430  vancomycin (VANCOCIN) IVPB 1000 mg/200 mL premix  Status:  Discontinued     1,000 mg 200 mL/hr over 60 Minutes Intravenous  Once 07/07/12 1410 07/10/12 1246      Assessment/Plan: s/p Procedure(s): EXPLORATORY LAPAROTOMY repair gastric leak (N/A) Recurrent gastric perforation Severe protein calorie malnutrition  Continue TNA Start sips of clears, but would not start weaning TNA for a few more days VAC change Saturday  Lab work today Recheck prealbumin next week to see if there has been any improvement in nutritional status. Purulent drainage from right JP is concerning, but this may be residual purulence from the last leak.  UGI showed no leak.   LOS: 11 days    Aulani Shipton K. 07/18/2012

## 2012-07-18 NOTE — Progress Notes (Signed)
Received orders for rolling walker for home use.  Jermaine spoke with the patient and she stated that she already has one at home.  No DME needs at this time.

## 2012-07-18 NOTE — Progress Notes (Signed)
PARENTERAL NUTRITION CONSULT NOTE - FOLLOW UP  Pharmacy Consult for TNA Indication: Recurrent gastric perforation, prolonged NPO status  Allergies  Allergen Reactions  . Avelox (Moxifloxacin Hcl In Nacl) Nausea And Vomiting  . Alendronate Sodium Nausea And Vomiting  . Aspirin Nausea Only  . Codeine Nausea And Vomiting  . Doxycycline   . Fluconazole   . Hydrocodone Nausea And Vomiting    GI distress  . Neurontin (Gabapentin) Other (See Comments)    Mood changes   . Nsaids Other (See Comments)    Severe gastritis & perforation - avoid NSAIDs when possible  . Sertraline Hcl Other (See Comments)    Hallucinations   . Sulfa Antibiotics Rash    Patient Measurements: Height: 5' 4.96" (165 cm) (Documented 07/02/12) Weight: 97 lb 10.6 oz (44.3 kg) IBW/kg (Calculated) : 56.91 Usual Weight: ~44kg (stable)  Vital Signs: Temp: 98.7 F (37.1 C) (03/27 2108) Temp src: Oral (03/27 2108) BP: 116/76 mmHg (03/27 2108) Pulse Rate: 87 (03/27 2108) Intake/Output from previous day: 03/27 0701 - 03/28 0700 In: 3656.2 [I.V.:1341.7; IV Piggyback:100; TPN:2214.6] Out: 1802 [Urine:1700; Drains:102] Intake/Output from this shift:    Labs:  Recent Labs  07/15/12 2340  WBC 20.7*  HGB 9.8*  HCT 28.7*  PLT 508*     Recent Labs  07/16/12 0614 07/17/12 0428  NA 134* 135  K 3.6 4.0  CL 99 100  CO2 27 28  GLUCOSE 118* 108*  BUN 8 11  CREATININE 0.49* 0.60  CALCIUM 8.9 8.9  MG  --  1.8  PHOS 2.9 3.2  PROT  --  6.7  ALBUMIN  --  1.8*  AST  --  14  ALT  --  10  ALKPHOS  --  78  BILITOT  --  0.2*  Corrected Ca 10.66 (3/25) Estimated Creatinine Clearance: 49 ml/min (by C-G formula based on Cr of 0.6).    Medications:  Scheduled:  . enoxaparin (LOVENOX) injection  40 mg Subcutaneous Q24H  . ertapenem  1 g Intravenous Q24H  . HYDROmorphone PCA 0.3 mg/mL   Intravenous Q4H  . insulin aspart  0-9 Units Subcutaneous Q4H  . lip balm  1 application Topical BID  . pantoprazole  (PROTONIX) IV  40 mg Intravenous Q12H   Infusions:  . Marland KitchenTPN (CLINIMIX-E) Adult 65 mL/hr at 07/17/12 1710  . 0.9 % NaCl with KCl 20 mEq / L 35 mL/hr at 07/17/12 0930  . dextrose 50 mL/hr at 07/17/12 1620  . [EXPIRED] fat emulsion 250 mL (07/16/12 1734)  . [EXPIRED] .TPN (CLINIMIX-E) Adult 50 mL/hr at 07/16/12 1735  . [DISCONTINUED] 0.9 % NaCl with KCl 20 mEq / L 50 mL/hr (07/17/12 0459)    Insulin Requirements in the past 24 hours:  CBG range: 101 - 126 2 units SSI sensitive scale  Current Nutrition:  NPO Clinimix E5/20 at 27ml/hr 20% Lipids at 15ml/hr on MWF  IVF: NS w/ 20KCl at 38ml/hr  Nutritional Goals:  RD recs (3/24): Kcal: 1400-1750, Protein: 66-80 grams, Fluid: 1.4-1.8 L Clinimix  5/20 at 65 ml/hr + Lipids MWF = 78 g protein/day and an average of 1579 kcal  Assessment: 67 y/o F recently hospitalized 3/12-3/14 for supraumbilical hernia repair and gastric biopsy, admitted with gastric perforation s/p repair on 07/13/12. PICC ordered 3/24 with TPN to start for ongoing/prolonged NPO status and gastric leak. NG tube in place. TNA has advanced slowly due to risk for refeeding syndrome and is now at goal.   Gastrografin study 3/25 shows no perforation.  Glucose: at goal < 150, improved with SSI Electrolytes:Lytes WNL, Corr Ca slightly elevated (unable to adjust electrolytes in premixed TNA) Renal:  SCr stable, CrCL 49 ml/min, good UOP (1.6 ml/kg/hr), I/O: +1.85L LFTs: WNL, albumin low TGs: WNL (118) Prealbumin: severely low, <3 (3/25)  3/28: D5 TNA, appears to be tolerating TNA.  Diet to advance today to sips of clears.  Pt reports nausea overnight.  MD notes to not wean TNA for a few more days.  JP still with purulent drainage, unclear if this is residual from last leak.   Plan: at 18:00 tonight  Continue Clinimix E5/20 to 90ml/hr   Fat emulsion at 10 ml/hr (MWF only due to ongoing shortage).   Standard multivitamins and trace elements (MWF only due to ongoing shortage).  Pt currently unable to take PO MVI at this time.  Continue CBGs q8h, with sensitive scale SSI.  Maintain IVF at 60ml/hr  TNA lab panels on Mondays & Thursdays.  Follow up ability to advance diet, wean/dc TNA  Haynes Hoehn, PharmD 07/18/2012 7:13 AM  Pager: [phone]

## 2012-07-19 LAB — GLUCOSE, CAPILLARY: Glucose-Capillary: 125 mg/dL — ABNORMAL HIGH (ref 70–99)

## 2012-07-19 MED ORDER — INSULIN ASPART 100 UNIT/ML ~~LOC~~ SOLN
0.0000 [IU] | Freq: Three times a day (TID) | SUBCUTANEOUS | Status: DC
  Administered 2012-07-21: 2 [IU] via SUBCUTANEOUS
  Administered 2012-07-22: 1 [IU] via SUBCUTANEOUS
  Administered 2012-07-22: 09:00:00 via SUBCUTANEOUS

## 2012-07-19 MED ORDER — CLINIMIX E/DEXTROSE (5/20) 5 % IV SOLN
INTRAVENOUS | Status: AC
  Administered 2012-07-19: 18:00:00 via INTRAVENOUS
  Filled 2012-07-19: qty 2000

## 2012-07-19 MED ORDER — LORAZEPAM 2 MG/ML IJ SOLN
0.5000 mg | Freq: Three times a day (TID) | INTRAMUSCULAR | Status: DC | PRN
  Administered 2012-07-22: 1 mg via INTRAVENOUS
  Filled 2012-07-19: qty 1

## 2012-07-19 MED ORDER — SACCHAROMYCES BOULARDII 250 MG PO CAPS
250.0000 mg | ORAL_CAPSULE | Freq: Two times a day (BID) | ORAL | Status: DC
  Administered 2012-07-19 – 2012-07-23 (×9): 250 mg via ORAL
  Filled 2012-07-19 (×12): qty 1

## 2012-07-19 MED ORDER — FLUOXETINE HCL 20 MG PO CAPS
40.0000 mg | ORAL_CAPSULE | Freq: Every day | ORAL | Status: DC
  Administered 2012-07-20 – 2012-07-26 (×7): 40 mg via ORAL
  Filled 2012-07-19 (×8): qty 2

## 2012-07-19 MED ORDER — ENSURE COMPLETE PO LIQD
237.0000 mL | Freq: Three times a day (TID) | ORAL | Status: DC
  Administered 2012-07-19 – 2012-07-22 (×4): 237 mL via ORAL
  Filled 2012-07-19: qty 237

## 2012-07-19 MED ORDER — FAMOTIDINE 20 MG PO TABS
20.0000 mg | ORAL_TABLET | Freq: Two times a day (BID) | ORAL | Status: DC
  Administered 2012-07-19 – 2012-07-22 (×7): 20 mg via ORAL
  Filled 2012-07-19 (×10): qty 1

## 2012-07-19 MED ORDER — ACETAMINOPHEN 500 MG PO TABS
1000.0000 mg | ORAL_TABLET | Freq: Three times a day (TID) | ORAL | Status: DC
  Administered 2012-07-19 – 2012-07-23 (×13): 1000 mg via ORAL
  Filled 2012-07-19: qty 1
  Filled 2012-07-19 (×19): qty 2
  Filled 2012-07-19: qty 1

## 2012-07-19 NOTE — Progress Notes (Addendum)
PARENTERAL NUTRITION CONSULT NOTE - FOLLOW UP  Pharmacy Consult for TNA Indication: Recurrent gastric perforation, prolonged NPO status  Allergies  Allergen Reactions  . Avelox (Moxifloxacin Hcl In Nacl) Nausea And Vomiting  . Alendronate Sodium Nausea And Vomiting  . Aspirin Nausea Only  . Codeine Nausea And Vomiting  . Doxycycline   . Fluconazole   . Hydrocodone Nausea And Vomiting    GI distress  . Neurontin (Gabapentin) Other (See Comments)    Mood changes   . Nsaids Other (See Comments)    Severe gastritis & perforation - avoid NSAIDs when possible  . Sertraline Hcl Other (See Comments)    Hallucinations   . Sulfa Antibiotics Rash    Patient Measurements: Height: 5' 4.96" (165 cm) (Documented 07/02/12) Weight: 97 lb 10.6 oz (44.3 kg) IBW/kg (Calculated) : 56.91 Usual Weight: ~44kg (stable)  Vital Signs: Temp: 97.9 F (36.6 C) (03/29 0610) Temp src: Oral (03/29 0610) BP: 111/71 mmHg (03/29 0610) Pulse Rate: 83 (03/29 0610) Intake/Output from previous day: 03/28 0701 - 03/29 0700 In: 1030.6 [I.V.:980.6; IV Piggyback:50] Out: 2405 [Urine:2250; Drains:155] Intake/Output from this shift:    Labs:  Recent Labs  07/18/12 0815  WBC 15.9*  HGB 9.4*  HCT 26.7*  PLT 514*     Recent Labs  07/17/12 0428 07/18/12 0815  NA 135 133*  K 4.0 4.0  CL 100 98  CO2 28 29  GLUCOSE 108* 113*  BUN 11 12  CREATININE 0.60 0.55  CALCIUM 8.9 9.0  MG 1.8  --   PHOS 3.2  --   PROT 6.7  --   ALBUMIN 1.8*  --   AST 14  --   ALT 10  --   ALKPHOS 78  --   BILITOT 0.2*  --   Corrected Ca 10.66 (3/25) Estimated Creatinine Clearance: 49 ml/min (by C-G formula based on Cr of 0.55).    Medications:  Scheduled:  . enoxaparin (LOVENOX) injection  30 mg Subcutaneous Q24H  . ertapenem  1 g Intravenous Q24H  . HYDROmorphone PCA 0.3 mg/mL   Intravenous Q4H  . insulin aspart  0-9 Units Subcutaneous Q4H  . lip balm  1 application Topical BID  . pantoprazole (PROTONIX) IV   40 mg Intravenous Q12H  . [DISCONTINUED] enoxaparin (LOVENOX) injection  40 mg Subcutaneous Q24H   Infusions:  . [EXPIRED] .TPN (CLINIMIX-E) Adult 65 mL/hr at 07/17/12 1710  . Marland KitchenTPN (CLINIMIX-E) Adult 65 mL/hr at 07/18/12 1804  . 0.9 % NaCl with KCl 20 mEq / L 35 mL/hr at 07/18/12 0727  . dextrose 50 mL/hr at 07/17/12 1620  . fat emulsion 250 mL (07/18/12 1804)    Insulin Requirements in the past 24 hours:  CBG range: 89-125 2 units SSI sensitive scale  Current Nutrition:  NPO Clinimix E5/20 at 67ml/hr 20% Lipids at 73ml/hr on MWF  IVF: NS w/ 20KCl at 36ml/hr  Nutritional Goals:  RD recs (3/24): Kcal: 1400-1750, Protein: 66-80 grams, Fluid: 1.4-1.8 L Clinimix  5/20 at 65 ml/hr + Lipids MWF = 78 g protein/day and an average of 1579 kcal  Assessment: 67 y/o F recently hospitalized 3/12-3/14 for supraumbilical hernia repair and gastric biopsy, admitted with gastric perforation s/p repair on 07/13/12. PICC ordered 3/24 with TPN to start for ongoing/prolonged NPO status and gastric leak. NG tube in place. TNA has advanced slowly due to risk for refeeding syndrome and is now at goal.   Gastrografin study 3/25 shows no perforation.   Glucose: at goal < 150,  improved with SSI Electrolytes WNL, Corr Ca slightly elevated - unable to adjust electrolytes in premixed TNA, 3/28 Renal:  SCr stable, CrCL 49 ml/min, good UOP (2.1 ml/kg/hr), I/O: -1.4L LFTs: WNL, albumin low TGs: WNL (118) Prealbumin: severely low, <3 (3/25)  3/29: D6 TNA, appears to be tolerating TNA.   Pt reports nausea overnight.  MD notes to not wean TNA for a few more days.    Plan: at 18:00 tonight  Continue Clinimix E5/20 to 47ml/hr   Fat emulsion at 10 ml/hr (MWF only due to ongoing shortage).   Standard multivitamins and trace elements (MWF only due to ongoing shortage). Pt currently unable to take PO MVI at this time.  Continue CBGs q8h, with sensitive scale SSI.  Maintain IVF at 67ml/hr  TNA lab panels on  Mondays & Thursdays.  Follow up ability to advance diet, wean/dc TNA  Gwen Her PharmD  [phone] 07/19/2012 7:51 AM

## 2012-07-19 NOTE — Care Management (Addendum)
Cm spoke with patient with family present at bedside. Pt states dc plan includes home with [facility] services. Pt & family present state patient will have 24 care. Per pt choice [facility] to provide [facility] services upon. Pt informed per physician choice [facility] avoided as choice. Cm faxed MD orders, facesheet & H/P faxed to [facility] to follow at [phone]. Confirmation received. Awaiting pt's discharge when medically stable.   Roxy Manns Shareta Fishbaugh,RN,BSN [phone]

## 2012-07-19 NOTE — Progress Notes (Signed)
Cynthia Bowen [id number] 10-24-1945  CARE TEAM:  PCP: Tonye Pearson, MD  Outpatient Care Team: Patient Care Team: Tonye Pearson, MD as PCP - General (Family Medicine)  Inpatient Treatment Team: Treatment Team: Attending Provider: [name]. [name], MD; Dietitian: Lavena Bullion, RD; Technician: Vella Raring, NT; Registered Nurse: Tristan Schroeder, RN; Registered Nurse: Bethann Goo, RN; Consulting Physician: Bishop Limbo, MD; Registered Nurse: Narda Rutherford, RN; Registered Nurse: Sydell Axon, RN; Registered Nurse: Valente David Burchett, Student-RN; Technician: Lynden Ang, NT; Registered Nurse: Jonathon Bellows, RN; Registered Nurse: Leonie Douglas, RN   Subjective:  Pain mild Cynthia Bowen - PCA helps RN Gunnar Fusi in room Mild nausea yest - no emesis.  Better now  Objective:  Vital signs:  Filed Vitals:   07/19/12 0230 07/19/12 0400 07/19/12 0610 07/19/12 0723  BP: 104/68  111/71   Pulse: 84  83   Temp: 98.1 F (36.7 C)  97.9 F (36.6 C)   TempSrc: Oral  Oral   Resp: 16 12 16 13   Height:      Weight:      SpO2: 98%  98% 100%    Last BM Date: 07/17/12  Intake/Output   Yesterday:  03/28 0701 - 03/29 0700 In: 1030.6 [I.V.:980.6; IV Piggyback:50] Out: 2405 [Urine:2250; Drains:155] This shift:     Bowel function:  Flatus: y  BM: n  Drain: thin serosanguinous  Physical Exam:  General: Pt awake/alert/oriented x4 in no acute distress.  Thin/cachectic - unchanged Eyes: PERRL, normal EOM.  Sclera clear.  No icterus Neuro: CN II-XII intact w/o focal sensory/motor deficits. Lymph: No head/neck/groin lymphadenopathy Psych:  No delerium/psychosis/paranoia HENT: Normocephalic, Mucus membranes moist.  No thrush Neck: Supple, No tracheal deviation Chest: No chest wall pain w good excursion CV:  Pulses intact.  Regular rhythm MS: Normal AROM mjr joints.  No obvious deformity Abdomen: Soft.  Nondistended.  Mildly tender at incisions only.   Wound vac clean.  No peritonitis.  No incarcerated hernias. Ext:  SCDs BLE.  No mjr edema.  No cyanosis Skin: No petechiae / purpurae]  Results:   Labs: Results for orders placed during the hospital encounter of 07/07/12 (from the past 48 hour(s))  GLUCOSE, CAPILLARY     Status: Abnormal   Collection Time    07/17/12 11:34 AM      Result Value Range   Glucose-Capillary 115 (*) 70 - 99 mg/dL  GLUCOSE, CAPILLARY     Status: Abnormal   Collection Time    07/17/12  4:22 PM      Result Value Range   Glucose-Capillary 101 (*) 70 - 99 mg/dL  GLUCOSE, CAPILLARY     Status: Abnormal   Collection Time    07/17/12  8:57 PM      Result Value Range   Glucose-Capillary 110 (*) 70 - 99 mg/dL  GLUCOSE, CAPILLARY     Status: Abnormal   Collection Time    07/17/12 11:23 PM      Result Value Range   Glucose-Capillary 126 (*) 70 - 99 mg/dL  GLUCOSE, CAPILLARY     Status: Abnormal   Collection Time    07/18/12  3:38 AM      Result Value Range   Glucose-Capillary 122 (*) 70 - 99 mg/dL  GLUCOSE, CAPILLARY     Status: Abnormal   Collection Time    07/18/12  7:45 AM      Result Value Range   Glucose-Capillary 132 (*) 70 -  99 mg/dL  CBC     Status: Abnormal   Collection Time    07/18/12  8:15 AM      Result Value Range   WBC 15.9 (*) 4.0 - 10.5 K/uL   RBC 3.25 (*) 3.87 - 5.11 MIL/uL   Hemoglobin 9.4 (*) 12.0 - 15.0 g/dL   HCT 16.1 (*) 09.6 - 04.5 %   MCV 82.2  78.0 - 100.0 fL   MCH 28.9  26.0 - 34.0 pg   MCHC 35.2  30.0 - 36.0 g/dL   RDW 40.9 (*) 81.1 - 91.4 %   Platelets 514 (*) 150 - 400 K/uL  BASIC METABOLIC PANEL     Status: Abnormal   Collection Time    07/18/12  8:15 AM      Result Value Range   Sodium 133 (*) 135 - 145 mEq/L   Potassium 4.0  3.5 - 5.1 mEq/L   Chloride 98  96 - 112 mEq/L   CO2 29  19 - 32 mEq/L   Glucose, Bld 113 (*) 70 - 99 mg/dL   BUN 12  6 - 23 mg/dL   Creatinine, Ser 7.82  0.50 - 1.10 mg/dL   Calcium 9.0  8.4 - 95.6 mg/dL   GFR calc non Af Amer >90  >90  mL/min   GFR calc Af Amer >90  >90 mL/min   Comment:            The eGFR has been calculated     using the CKD EPI equation.     This calculation has not been     validated in all clinical     situations.     eGFR's persistently     <90 mL/min signify     possible Chronic Kidney Disease.  GLUCOSE, CAPILLARY     Status: Abnormal   Collection Time    07/18/12 11:54 AM      Result Value Range   Glucose-Capillary 101 (*) 70 - 99 mg/dL  GLUCOSE, CAPILLARY     Status: Abnormal   Collection Time    07/18/12  3:57 PM      Result Value Range   Glucose-Capillary 134 (*) 70 - 99 mg/dL  GLUCOSE, CAPILLARY     Status: Abnormal   Collection Time    07/18/12  8:31 PM      Result Value Range   Glucose-Capillary 117 (*) 70 - 99 mg/dL  GLUCOSE, CAPILLARY     Status: Abnormal   Collection Time    07/19/12 12:29 AM      Result Value Range   Glucose-Capillary 119 (*) 70 - 99 mg/dL  GLUCOSE, CAPILLARY     Status: Abnormal   Collection Time    07/19/12  4:20 AM      Result Value Range   Glucose-Capillary 115 (*) 70 - 99 mg/dL  GLUCOSE, CAPILLARY     Status: Abnormal   Collection Time    07/19/12  7:36 AM      Result Value Range   Glucose-Capillary 125 (*) 70 - 99 mg/dL    Imaging / Studies: No results found.  Medications / Allergies: per chart  Antibiotics: Anti-infectives   Start     Dose/Rate Route Frequency Ordered Stop   07/14/12 1000  ertapenem (INVANZ) 1 g in sodium chloride 0.9 % 50 mL IVPB     1 g 100 mL/hr over 30 Minutes Intravenous Every 24 hours 07/14/12 0856     07/09/12 1000  vancomycin (VANCOCIN) 750  mg in sodium chloride 0.9 % 150 mL IVPB  Status:  Discontinued     750 mg 150 mL/hr over 60 Minutes Intravenous Every 24 hours 07/08/12 0818 07/11/12 0801   07/08/12 1600  vancomycin (VANCOCIN) 750 mg in sodium chloride 0.9 % 150 mL IVPB  Status:  Discontinued     750 mg 150 mL/hr over 60 Minutes Intravenous Every 24 hours 07/07/12 1455 07/08/12 0818   07/08/12 0900   vancomycin (VANCOCIN) IVPB 1000 mg/200 mL premix     1,000 mg 200 mL/hr over 60 Minutes Intravenous  Once 07/08/12 0818 07/08/12 1150   07/08/12 0200  piperacillin-tazobactam (ZOSYN) IVPB 3.375 g  Status:  Discontinued     3.375 g 12.5 mL/hr over 240 Minutes Intravenous Every 8 hours 07/07/12 1722 07/14/12 0856   07/07/12 1730  [MAR Hold]  piperacillin-tazobactam (ZOSYN) IVPB 3.375 g     (On MAR Hold since 07/07/12 1724)   3.375 g 100 mL/hr over 30 Minutes Intravenous To Surgery 07/07/12 1722 07/07/12 1737   07/07/12 1430  piperacillin-tazobactam (ZOSYN) IVPB 3.375 g  Status:  Discontinued     3.375 g 12.5 mL/hr over 240 Minutes Intravenous Every 8 hours 07/07/12 1404 07/07/12 1722   07/07/12 1430  vancomycin (VANCOCIN) IVPB 1000 mg/200 mL premix  Status:  Discontinued     1,000 mg 200 mL/hr over 60 Minutes Intravenous  Once 07/07/12 1410 07/10/12 1246      Problem List:   Principal Problem:   Gastric perforation with abscess/peritonitis s/p omental patch repair 07/07/2012 Active Problems:   Depression   GERD (gastroesophageal reflux disease)   Osteoarthritis   Fibromyalgia   Back pain   COPD (chronic obstructive pulmonary disease)   Unspecified gastritis and gastroduodenitis without mention of hemorrhage   Loss of weight   Anorexia   Incarcerated epigastric hernia s/p primary repair 07/02/2012   Protein-calorie malnutrition, moderate   Assessment  Darron Doom  67 y.o. female  6 Days Post-Op  Procedure(s): EXPLORATORY LAPAROTOMY repair gastric leak x2   Stabilizing  Plan:  -TNA primary nutrition -try liquids - slowly -bowel regimen as tolerated -IV ABx - invanz.  Drainage more thin a hopeful sign.  If worse, consider re-C&S. -continue drains, especially w severe malnutrition & breakdown of closure x 2. -IV PPI  -wound vac -VTE prophylaxis- SCDs, etc -mobilize as tolerated to help recovery  Ardeth Sportsman, M.D., F.A.C.S. Gastrointestinal and Minimally Invasive  Surgery [facility] [address] [place], [place] [address] [phone] Main / Paging [phone] Voice Mail   07/19/2012

## 2012-07-20 LAB — GLUCOSE, CAPILLARY
Glucose-Capillary: 105 mg/dL — ABNORMAL HIGH (ref 70–99)
Glucose-Capillary: 107 mg/dL — ABNORMAL HIGH (ref 70–99)
Glucose-Capillary: 117 mg/dL — ABNORMAL HIGH (ref 70–99)

## 2012-07-20 MED ORDER — DIPHENHYDRAMINE HCL 50 MG/ML IJ SOLN: 12.5000 mg | Freq: Four times a day (QID) | INTRAMUSCULAR | Status: DC | PRN

## 2012-07-20 MED ORDER — HYDROMORPHONE HCL PF 1 MG/ML IJ SOLN: 1.0000 mg | INTRAMUSCULAR | Status: DC | PRN

## 2012-07-20 MED ORDER — DIPHENHYDRAMINE HCL 12.5 MG/5ML PO ELIX: 12.5000 mg | ORAL_SOLUTION | Freq: Four times a day (QID) | ORAL | Status: DC | PRN

## 2012-07-20 MED ORDER — CLINIMIX E/DEXTROSE (5/20) 5 % IV SOLN
INTRAVENOUS | Status: AC
  Administered 2012-07-20: 18:00:00 via INTRAVENOUS
  Filled 2012-07-20: qty 2000

## 2012-07-20 MED ORDER — NALOXONE HCL 0.4 MG/ML IJ SOLN: 0.4000 mg | INTRAMUSCULAR | Status: DC | PRN

## 2012-07-20 MED ORDER — SODIUM CHLORIDE 0.9 % IJ SOLN: 9.0000 mL | INTRAMUSCULAR | Status: DC | PRN

## 2012-07-20 MED ORDER — HYDROMORPHONE 0.3 MG/ML IV SOLN
INTRAVENOUS | Status: DC
  Administered 2012-07-20: 11.82 mg via INTRAVENOUS
  Administered 2012-07-20: 1.8 mg via INTRAVENOUS
  Administered 2012-07-20: 4.8 mg via INTRAVENOUS
  Administered 2012-07-20: 2.1 mg via INTRAVENOUS
  Administered 2012-07-21: 1 mg via INTRAVENOUS
  Administered 2012-07-21: 1.64 mg via INTRAVENOUS
  Filled 2012-07-20 (×2): qty 25

## 2012-07-20 MED ORDER — ONDANSETRON HCL 4 MG/2ML IJ SOLN
4.0000 mg | Freq: Four times a day (QID) | INTRAMUSCULAR | Status: DC | PRN
  Administered 2012-07-21 – 2012-07-25 (×8): 4 mg via INTRAVENOUS
  Filled 2012-07-20 (×9): qty 2

## 2012-07-20 NOTE — Progress Notes (Signed)
ANJELIKA AUSBURN [id number] 1945-06-14  CARE TEAM:  PCP: Tonye Pearson, MD  Outpatient Care Team: Patient Care Team: Tonye Pearson, MD as PCP - General (Family Medicine)  Inpatient Treatment Team: Treatment Team: Attending Provider: [name]. [name], MD; Dietitian: Lavena Bullion, RD; Technician: Vella Raring, NT; Registered Nurse: Tristan Schroeder, RN; Registered Nurse: Bethann Goo, RN; Consulting Physician: Bishop Limbo, MD; Registered Nurse: Narda Rutherford, RN; Registered Nurse: Sydell Axon, RN; Registered Nurse: Valente David Burchett, Student-RN; Registered Nurse: Jonathon Bellows, RN; Registered Nurse: Leonie Douglas, RN; Technician: Zerita Boers Mag[place]ang, NT   Subjective:  Sore - PCA a little too weak Tolerating liquids better.  Likes ensure  Objective:  Vital signs:  Filed Vitals:   07/19/12 2159 07/20/12 0001 07/20/12 0358 07/20/12 0550  BP:    104/68  Pulse:    90  Temp:    98.1 F (36.7 C)  TempSrc:    Oral  Resp: 12 11 14 15   Height:      Weight:      SpO2: 100% 100% 100% 95%    Last BM Date: 07/17/12  Intake/Output   Yesterday:  03/29 0701 - 03/30 0700 In: 5990.6 [P.O.:480; I.V.:840; ONG:2952.8] Out: 2225 [Urine:2225] This shift:     Bowel function:  Flatus: y  BM: n  Drain: right serosanguinous, left minimal serous - not recorded yesterday  Physical Exam:  General: Pt awake/alert/oriented x4 in no acute distress.  Thin/cachectic - unchanged Eyes: PERRL, normal EOM.  Sclera clear.  No icterus Neuro: CN II-XII intact w/o focal sensory/motor deficits. Lymph: No head/neck/groin lymphadenopathy Psych:  No delerium/psychosis/paranoia HENT: Normocephalic, Mucus membranes moist.  No thrush Neck: Supple, No tracheal deviation Chest: No chest wall pain w good excursion CV:  Pulses intact.  Regular rhythm MS: Normal AROM mjr joints.  No obvious deformity Abdomen: Flat.  Nondistended.  Mildly tender at incisions only.   Wound vac clean.  No peritonitis.  No incarcerated hernias. Ext:  SCDs BLE.  No edema/anasarca.  No cyanosis Skin: No petechiae / purpurae]  Results:   Labs: Results for orders placed during the hospital encounter of 07/07/12 (from the past 48 hour(s))  GLUCOSE, CAPILLARY     Status: Abnormal   Collection Time    07/18/12 11:54 AM      Result Value Range   Glucose-Capillary 101 (*) 70 - 99 mg/dL  GLUCOSE, CAPILLARY     Status: Abnormal   Collection Time    07/18/12  3:57 PM      Result Value Range   Glucose-Capillary 134 (*) 70 - 99 mg/dL  GLUCOSE, CAPILLARY     Status: Abnormal   Collection Time    07/18/12  8:31 PM      Result Value Range   Glucose-Capillary 117 (*) 70 - 99 mg/dL  GLUCOSE, CAPILLARY     Status: Abnormal   Collection Time    07/19/12 12:29 AM      Result Value Range   Glucose-Capillary 119 (*) 70 - 99 mg/dL  GLUCOSE, CAPILLARY     Status: Abnormal   Collection Time    07/19/12  4:20 AM      Result Value Range   Glucose-Capillary 115 (*) 70 - 99 mg/dL  GLUCOSE, CAPILLARY     Status: Abnormal   Collection Time    07/19/12  7:36 AM      Result Value Range   Glucose-Capillary 125 (*) 70 - 99 mg/dL  GLUCOSE,  CAPILLARY     Status: Abnormal   Collection Time    07/19/12 11:42 AM      Result Value Range   Glucose-Capillary 121 (*) 70 - 99 mg/dL  GLUCOSE, CAPILLARY     Status: None   Collection Time    07/19/12  4:31 PM      Result Value Range   Glucose-Capillary 89  70 - 99 mg/dL  GLUCOSE, CAPILLARY     Status: Abnormal   Collection Time    07/20/12 12:22 AM      Result Value Range   Glucose-Capillary 117 (*) 70 - 99 mg/dL    Imaging / Studies: No results found.  Medications / Allergies: per chart  Antibiotics: Anti-infectives   Start     Dose/Rate Route Frequency Ordered Stop   07/14/12 1000  ertapenem (INVANZ) 1 g in sodium chloride 0.9 % 50 mL IVPB     1 g 100 mL/hr over 30 Minutes Intravenous Every 24 hours 07/14/12 0856     07/09/12  1000  vancomycin (VANCOCIN) 750 mg in sodium chloride 0.9 % 150 mL IVPB  Status:  Discontinued     750 mg 150 mL/hr over 60 Minutes Intravenous Every 24 hours 07/08/12 0818 07/11/12 0801   07/08/12 1600  vancomycin (VANCOCIN) 750 mg in sodium chloride 0.9 % 150 mL IVPB  Status:  Discontinued     750 mg 150 mL/hr over 60 Minutes Intravenous Every 24 hours 07/07/12 1455 07/08/12 0818   07/08/12 0900  vancomycin (VANCOCIN) IVPB 1000 mg/200 mL premix     1,000 mg 200 mL/hr over 60 Minutes Intravenous  Once 07/08/12 0818 07/08/12 1150   07/08/12 0200  piperacillin-tazobactam (ZOSYN) IVPB 3.375 g  Status:  Discontinued     3.375 g 12.5 mL/hr over 240 Minutes Intravenous Every 8 hours 07/07/12 1722 07/14/12 0856   07/07/12 1730  [MAR Hold]  piperacillin-tazobactam (ZOSYN) IVPB 3.375 g     (On MAR Hold since 07/07/12 1724)   3.375 g 100 mL/hr over 30 Minutes Intravenous To Surgery 07/07/12 1722 07/07/12 1737   07/07/12 1430  piperacillin-tazobactam (ZOSYN) IVPB 3.375 g  Status:  Discontinued     3.375 g 12.5 mL/hr over 240 Minutes Intravenous Every 8 hours 07/07/12 1404 07/07/12 1722   07/07/12 1430  vancomycin (VANCOCIN) IVPB 1000 mg/200 mL premix  Status:  Discontinued     1,000 mg 200 mL/hr over 60 Minutes Intravenous  Once 07/07/12 1410 07/10/12 1246      Problem List:   Principal Problem:   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 [id number] Active Problems:   Protein-calorie malnutrition, severe   Depression   GERD (gastroesophageal reflux disease)   Osteoarthritis   Fibromyalgia   Back pain   COPD (chronic obstructive pulmonary disease)   Unspecified gastritis and gastroduodenitis without mention of hemorrhage   Loss of weight   Anorexia   Incarcerated epigastric hernia s/p primary repair 07/02/2012   Assessment  Darron Doom  67 y.o. female  7 Days Post-Op  Procedure(s): EXPLORATORY LAPAROTOMY repair gastric leak x2   Stabilizing but  fair  Plan:  -TNA primary nutrition -try liquids - slowly.  Cal counts.  Poss adv diet tomorrow if improved -bowel regimen as tolerated -IV ABx - invanz.  Falling WBC a hopeful sign - check tomorrow.  If worse, consider re-C&S or CT scan -continue drains, especially w severe malnutrition & breakdown of closure x 2.  D/w RN Diane about strict I&O -  including the drains -IV PPI  -anxiolysis -Tx depression w prozac -wound vac -VTE prophylaxis- SCDs, etc -mobilize as tolerated to help recovery -HH/outpatient care plan in process depending on recovery  Ardeth Sportsman, M.D., F.A.C.S. Gastrointestinal and Minimally Invasive Surgery [facility] [address] [place], [place] [address] [phone] Main / Paging [phone] Voice Mail   07/20/2012

## 2012-07-20 NOTE — Progress Notes (Signed)
PARENTERAL NUTRITION CONSULT NOTE - FOLLOW UP  Pharmacy Consult for TNA Indication: Recurrent gastric perforation, prolonged NPO status  Allergies  Allergen Reactions  . Avelox (Moxifloxacin Hcl In Nacl) Nausea And Vomiting  . Alendronate Sodium Nausea And Vomiting  . Aspirin Nausea Only  . Codeine Nausea And Vomiting  . Doxycycline   . Fluconazole   . Hydrocodone Nausea And Vomiting    GI distress  . Neurontin (Gabapentin) Other (See Comments)    Mood changes   . Nsaids Other (See Comments)    Severe gastritis & perforation - avoid NSAIDs when possible  . Sertraline Hcl Other (See Comments)    Hallucinations   . Sulfa Antibiotics Rash    Patient Measurements: Height: 5' 4.96" (165 cm) (Documented 07/02/12) Weight: 97 lb 10.6 oz (44.3 kg) IBW/kg (Calculated) : 56.91 Usual Weight: ~44kg (stable)  Vital Signs: Temp: 98.1 F (36.7 C) (03/30 0550) Temp src: Oral (03/30 0550) BP: 104/68 mmHg (03/30 0550) Pulse Rate: 90 (03/30 0550) Intake/Output from previous day: 03/29 0701 - 03/30 0700 In: 5990.6 [P.O.:480; I.V.:840; TPN:4535.6] Out: 2225 [Urine:2225] Intake/Output from this shift:    Labs:  Recent Labs  07/18/12 0815  WBC 15.9*  HGB 9.4*  HCT 26.7*  PLT 514*     Recent Labs  07/18/12 0815  NA 133*  K 4.0  CL 98  CO2 29  GLUCOSE 113*  BUN 12  CREATININE 0.55  CALCIUM 9.0  Corrected Ca 10.66 (3/25) Estimated Creatinine Clearance: 49 ml/min (by C-G formula based on Cr of 0.55).    Medications:  Scheduled:  . acetaminophen  1,000 mg Oral TID  . enoxaparin (LOVENOX) injection  30 mg Subcutaneous Q24H  . ertapenem  1 g Intravenous Q24H  . famotidine  20 mg Oral BID  . feeding supplement  237 mL Oral TID WC  . FLUoxetine  40 mg Oral QAC breakfast  . HYDROmorphone PCA 0.3 mg/mL   Intravenous Q4H  . insulin aspart  0-9 Units Subcutaneous Q8H  . lip balm  1 application Topical BID  . pantoprazole (PROTONIX) IV  40 mg Intravenous Q12H  .  saccharomyces boulardii  250 mg Oral BID  . [DISCONTINUED] insulin aspart  0-9 Units Subcutaneous Q4H   Infusions:  . [EXPIRED] .TPN (CLINIMIX-E) Adult 65 mL/hr at 07/18/12 1804  . Marland KitchenTPN (CLINIMIX-E) Adult 65 mL/hr at 07/19/12 1811  . 0.9 % NaCl with KCl 20 mEq / L 35 mL/hr at 07/19/12 1438  . dextrose 50 mL/hr at 07/17/12 1620  . [EXPIRED] fat emulsion 250 mL (07/18/12 1804)    Insulin Requirements in the past 24 hours:  CBG range: 89-125 2 units SSI sensitive scale  Current Nutrition:  CL diet Clinimix E5/20 at 41ml/hr 20% Lipids at 2ml/hr on MWF  IVF: NS w/ 20KCl at 46ml/hr  Nutritional Goals:  RD recs (3/24): Kcal: 1400-1750, Protein: 66-80 grams, Fluid: 1.4-1.8 L Clinimix  5/20 at 65 ml/hr + Lipids MWF = 78 g protein/day and an average of 1579 kcal  Assessment: 67 y/o F recently hospitalized 3/12-3/14 for supraumbilical hernia repair and gastric biopsy, admitted with gastric perforation s/p repair on 07/13/12. PICC ordered 3/24 with TPN to start for ongoing/prolonged NPO status and gastric leak. NG tube in place. TNA has advanced slowly due to risk for refeeding syndrome and is now at goal.   Gastrografin study 3/25 shows no perforation.   Glucose: cont at goal < 150, improved with SSI Electrolytes WNL 3/28, Corr Ca slightly elevated - unable to  adjust electrolytes in premixed TNA LFTs: WNL, albumin low TGs: WNL (118) Prealbumin: severely low, <3 (3/25)  3/30: D7 TNA, appears to be tolerating TNA.   CL diet started 3/29  Plan: at 18:00 tonight  Continue Clinimix E5/20 to 30ml/hr   Fat emulsion at 10 ml/hr (MWF only due to ongoing shortage).   Standard multivitamins and trace elements (MWF only due to ongoing shortage). Pt currently unable to take PO MVI at this time.  Continue CBGs q8h, with sensitive scale SSI.  Maintain IVF at 30ml/hr  TNA lab panels on Mondays & Thursdays.  Follow up ability to advance diet, wean/dc TNA  Gwen Her PharmD   [phone] 07/20/2012 7:25 AM

## 2012-07-21 ENCOUNTER — Encounter (INDEPENDENT_AMBULATORY_CARE_PROVIDER_SITE_OTHER): Payer: Medicare Other | Admitting: Surgery

## 2012-07-21 LAB — COMPREHENSIVE METABOLIC PANEL
AST: 35 U/L (ref 0–37)
Albumin: 1.9 g/dL — ABNORMAL LOW (ref 3.5–5.2)
BUN: 13 mg/dL (ref 6–23)
Calcium: 9.3 mg/dL (ref 8.4–10.5)
Chloride: 99 mEq/L (ref 96–112)
Creatinine, Ser: 0.51 mg/dL (ref 0.50–1.10)
Total Bilirubin: 0.1 mg/dL — ABNORMAL LOW (ref 0.3–1.2)

## 2012-07-21 LAB — CBC
HCT: 27 % — ABNORMAL LOW (ref 36.0–46.0)
Hemoglobin: 9.2 g/dL — ABNORMAL LOW (ref 12.0–15.0)
MCHC: 34.1 g/dL (ref 30.0–36.0)
RDW: 15.8 % — ABNORMAL HIGH (ref 11.5–15.5)
WBC: 11 10*3/uL — ABNORMAL HIGH (ref 4.0–10.5)

## 2012-07-21 LAB — DIFFERENTIAL
Basophils Absolute: 0.1 10*3/uL (ref 0.0–0.1)
Basophils Relative: 1 % (ref 0–1)
Lymphocytes Relative: 16 % (ref 12–46)
Monocytes Relative: 8 % (ref 3–12)
Neutro Abs: 8.2 10*3/uL — ABNORMAL HIGH (ref 1.7–7.7)
Neutrophils Relative %: 75 % (ref 43–77)

## 2012-07-21 LAB — PHOSPHORUS: Phosphorus: 3.6 mg/dL (ref 2.3–4.6)

## 2012-07-21 LAB — GLUCOSE, CAPILLARY

## 2012-07-21 LAB — MAGNESIUM: Magnesium: 1.9 mg/dL (ref 1.5–2.5)

## 2012-07-21 LAB — PREALBUMIN: Prealbumin: 8.1 mg/dL — ABNORMAL LOW (ref 17.0–34.0)

## 2012-07-21 LAB — TRIGLYCERIDES: Triglycerides: 84 mg/dL (ref <150)

## 2012-07-21 MED ORDER — FAT EMULSION 20 % IV EMUL
240.0000 mL | INTRAVENOUS | Status: AC
  Administered 2012-07-21: 240 mL via INTRAVENOUS
  Filled 2012-07-21: qty 250

## 2012-07-21 MED ORDER — CLINIMIX E/DEXTROSE (5/20) 5 % IV SOLN
INTRAVENOUS | Status: AC
  Administered 2012-07-21: 17:00:00 via INTRAVENOUS
  Filled 2012-07-21: qty 2000

## 2012-07-21 MED ORDER — ADULT MULTIVITAMIN W/MINERALS CH
1.0000 | ORAL_TABLET | Freq: Every day | ORAL | Status: DC
  Administered 2012-07-22: 1 via ORAL
  Filled 2012-07-21 (×2): qty 1

## 2012-07-21 MED ORDER — HYDROMORPHONE HCL 2 MG PO TABS
2.0000 mg | ORAL_TABLET | ORAL | Status: DC | PRN
  Administered 2012-07-21 – 2012-07-22 (×6): 2 mg via ORAL
  Filled 2012-07-21 (×6): qty 1

## 2012-07-21 MED ORDER — VITAMINS A & D EX OINT
TOPICAL_OINTMENT | CUTANEOUS | Status: AC
  Administered 2012-07-21: 1
  Filled 2012-07-21: qty 10

## 2012-07-21 MED ORDER — HYDROMORPHONE HCL PF 1 MG/ML IJ SOLN
1.0000 mg | INTRAMUSCULAR | Status: DC | PRN
  Administered 2012-07-22 – 2012-07-24 (×11): 1 mg via INTRAVENOUS
  Filled 2012-07-21 (×11): qty 1

## 2012-07-21 NOTE — Progress Notes (Signed)
8 Days Post-Op  Subjective:  Tolerating PO's - clear liquids/ nutritional supplements Calorie count pending Wound VAC change TTS  Objective: Vital signs in last 24 hours: Temp:  [97.7 F (36.5 C)-98.8 F (37.1 C)] 97.7 F (36.5 C) (03/31 0528) Pulse Rate:  [76-88] 85 (03/31 0528) Resp:  [9-20] 18 (03/31 0800) BP: (114-136)/(66-86) 114/72 mmHg (03/31 0528) SpO2:  [97 %-100 %] 98 % (03/31 0800) FiO2 (%):  [32 %] 32 % (03/30 1600) Last BM Date: 07/20/12  Intake/Output from previous day: 03/30 0701 - 03/31 0700 In: 6875.8 [P.O.:300; I.V.:4410.8; IV Piggyback:100; TPN:2065] Out: 1802 [Urine:1400; Drains:402] Intake/Output this shift: Total I/O In: 120 [P.O.:120] Out: -   General appearance: alert, cooperative and no distress GI: minimally distended; + BS VAC with good seal; right drain - reddish cloudy drainage; left drain - minimal serous  Lab Results:   Recent Labs  07/21/12 0415  WBC 11.0*  HGB 9.2*  HCT 27.0*  PLT 646*   BMET  Recent Labs  07/21/12 0415  NA 137  K 4.2  CL 99  CO2 32  GLUCOSE 85  BUN 13  CREATININE 0.51  CALCIUM 9.3   PT/INR No results found for this basename: LABPROT, INR,  in the last 72 hours ABG No results found for this basename: PHART, PCO2, PO2, HCO3,  in the last 72 hours  Prealbumin pending  Studies/Results: No results found.  Anti-infectives: Anti-infectives   Start     Dose/Rate Route Frequency Ordered Stop   07/14/12 1000  ertapenem (INVANZ) 1 g in sodium chloride 0.9 % 50 mL IVPB     1 g 100 mL/hr over 30 Minutes Intravenous Every 24 hours 07/14/12 0856     07/09/12 1000  vancomycin (VANCOCIN) 750 mg in sodium chloride 0.9 % 150 mL IVPB  Status:  Discontinued     750 mg 150 mL/hr over 60 Minutes Intravenous Every 24 hours 07/08/12 0818 07/11/12 0801   07/08/12 1600  vancomycin (VANCOCIN) 750 mg in sodium chloride 0.9 % 150 mL IVPB  Status:  Discontinued     750 mg 150 mL/hr over 60 Minutes Intravenous Every 24  hours 07/07/12 1455 07/08/12 0818   07/08/12 0900  vancomycin (VANCOCIN) IVPB 1000 mg/200 mL premix     1,000 mg 200 mL/hr over 60 Minutes Intravenous  Once 07/08/12 0818 07/08/12 1150   07/08/12 0200  piperacillin-tazobactam (ZOSYN) IVPB 3.375 g  Status:  Discontinued     3.375 g 12.5 mL/hr over 240 Minutes Intravenous Every 8 hours 07/07/12 1722 07/14/12 0856   07/07/12 1730  [MAR Hold]  piperacillin-tazobactam (ZOSYN) IVPB 3.375 g     (On MAR Hold since 07/07/12 1724)   3.375 g 100 mL/hr over 30 Minutes Intravenous To Surgery 07/07/12 1722 07/07/12 1737   07/07/12 1430  piperacillin-tazobactam (ZOSYN) IVPB 3.375 g  Status:  Discontinued     3.375 g 12.5 mL/hr over 240 Minutes Intravenous Every 8 hours 07/07/12 1404 07/07/12 1722   07/07/12 1430  vancomycin (VANCOCIN) IVPB 1000 mg/200 mL premix  Status:  Discontinued     1,000 mg 200 mL/hr over 60 Minutes Intravenous  Once 07/07/12 1410 07/10/12 1246      Assessment/Plan: s/p Procedure(s): EXPLORATORY LAPAROTOMY repair gastric leak (N/A) Advance to full liquids Awaiting results of calorie count/ prealbumin - continue TNA for now Try to wean off of PCA Continue antibiotics for now  LOS: 14 days    Cynthia Brubacher K. 07/21/2012

## 2012-07-21 NOTE — Progress Notes (Signed)
Physical Therapy Treatment Patient Details Name: Cynthia Bowen MRN: [id number] DOB: 04/06/1946 Today's Date: 07/21/2012 Time: 0865-7846 PT Time Calculation (min): 28 min  PT Assessment / Plan / Recommendation Comments on Treatment Session  Pt progressing well and stated her plans are to D/C to home.  States she has nephews that can assist her if needed but usually she helps them.    Follow Up Recommendations  Home health PT;Supervision - Intermittent     Does the patient have the potential to tolerate intense rehabilitation     Barriers to Discharge        Equipment Recommendations  Rolling walker with 5" wheels    Recommendations for Other Services    Frequency Min 3X/week   Plan      Precautions / Restrictions     Pertinent Vitals/Pain C/o ABD "tightness"    Mobility  Bed Mobility Bed Mobility: Supine to Sit Supine to Sit: 4: Min assist Details for Bed Mobility Assistance: min assist only for lined/tube safety as pt has 2 drains and a wound VAC Transfers Transfers: Sit to Stand;Stand to Sit Sit to Stand: 4: Min assist;From bed;From toilet Stand to Sit: 4: Min assist;To toilet;To chair/3-in-1 Details for Transfer Assistance: 50% VC's on proper tech and hand placement esp with stand to sit to controll decend. Mild unsteadyness. Ambulation/Gait Ambulation/Gait Assistance: 1: +2 Total assist Ambulation/Gait: Patient Percentage: 90% Ambulation Distance (Feet): 50 Feet Assistive device: Rolling walker Ambulation/Gait Assistance Details: + 2 assist for safety and equipment.  Amb on RA sats avg 94 and HR 124.  Pt required only 2 standing rest breaks.  Tolerated amb around full unit. C/o mild ABD pain with activity "tightness" Gait Pattern: Step-through pattern;Decreased step length - right;Decreased step length - left;Decreased stride length Gait velocity: decreased    PT Goals                                            progressing    Visit Information  Last PT  Received On: 07/21/12 Assistance Needed: +2 (equipment)    Subjective Data      Cognition       Balance   fair  End of Session PT - End of Session Equipment Utilized During Treatment: Gait belt Activity Tolerance: Patient limited by pain;Patient limited by fatigue Patient left: in chair;with call bell/phone within reach Nurse Communication: Mobility status   Felecia Shelling  PTA [facility]  Acute  Rehab Pager      [phone]

## 2012-07-21 NOTE — Progress Notes (Signed)
Calorie Count Note  Intervention:  - Encouraged increased meal and supplement intake - Will continue to monitor and analyze calorie count  48 hour calorie count ordered.  Diet: Full liquid Supplements: Ensure Complete TID  3/31 Breakfast: 74 calories, 1g protein Lunch: 310 calories, 4g protein Dinner: Not yet ordered during RD visit Supplements: 350 calories, 13g protein  Total intake: 734 kcal (52% of minimum estimated needs)  18g protein (27% of minimum estimated needs)  Nutrition Dx: Inadequate oral intake related to inability to eat as evidenced by NPO - no longer appropriate, diet advanced.   New nutrition dx: Increased nutrient needs related to recent repair of gastric perforation as evidenced by MD notes.    Goal: Pt to meet >/= 90% of their estimated nutrition needs - met with TPN, improving with PO intake    Levon Hedger MS, RD, LDN [phone] Pager [phone] After Hours Pager

## 2012-07-21 NOTE — Progress Notes (Signed)
PARENTERAL NUTRITION CONSULT NOTE - FOLLOW UP  Pharmacy Consult for TNA Indication: Recurrent gastric perforation, prolonged NPO status  Allergies  Allergen Reactions  . Avelox (Moxifloxacin Hcl In Nacl) Nausea And Vomiting  . Alendronate Sodium Nausea And Vomiting  . Aspirin Nausea Only  . Codeine Nausea And Vomiting  . Doxycycline   . Fluconazole   . Hydrocodone Nausea And Vomiting    GI distress  . Neurontin (Gabapentin) Other (See Comments)    Mood changes   . Nsaids Other (See Comments)    Severe gastritis & perforation - avoid NSAIDs when possible  . Sertraline Hcl Other (See Comments)    Hallucinations   . Sulfa Antibiotics Rash    Patient Measurements: Height: 5' 4.96" (165 cm) (Documented 07/02/12) Weight: 97 lb 10.6 oz (44.3 kg) IBW/kg (Calculated) : 56.91 Usual Weight: ~44kg (stable)  Vital Signs: Temp: 99 F (37.2 C) (03/31 1000) Temp src: Oral (03/31 1000) BP: 100/55 mmHg (03/31 1000) Pulse Rate: 85 (03/31 0528) Intake/Output from previous day: 03/30 0701 - 03/31 0700 In: 6875.8 [P.O.:300; I.V.:4410.8; IV Piggyback:100; TPN:2065] Out: 1802 [Urine:1400; Drains:402] Intake/Output from this shift: Total I/O In: 480 [P.O.:480] Out: 600 [Urine:600]  Labs:  Recent Labs  07/21/12 0415  WBC 11.0*  HGB 9.2*  HCT 27.0*  PLT 646*     Recent Labs  07/21/12 0415  NA 137  K 4.2  CL 99  CO2 32  GLUCOSE 85  BUN 13  CREATININE 0.51  CALCIUM 9.3  MG 1.9  PHOS 3.6  PROT 7.1  ALBUMIN 1.9*  AST 35  ALT 26  ALKPHOS 91  BILITOT 0.1*  PREALBUMIN 8.1*  TRIG 84  CHOL 76  Corrected Ca 10.66 (3/25) Estimated Creatinine Clearance: 49 ml/min (by C-G formula based on Cr of 0.51).    Medications:  Scheduled:  . acetaminophen  1,000 mg Oral TID  . enoxaparin (LOVENOX) injection  30 mg Subcutaneous Q24H  . ertapenem  1 g Intravenous Q24H  . famotidine  20 mg Oral BID  . feeding supplement  237 mL Oral TID WC  . FLUoxetine  40 mg Oral QAC breakfast   . insulin aspart  0-9 Units Subcutaneous Q8H  . lip balm  1 application Topical BID  . pantoprazole (PROTONIX) IV  40 mg Intravenous Q12H  . saccharomyces boulardii  250 mg Oral BID  . [DISCONTINUED] HYDROmorphone PCA 0.3 mg/mL   Intravenous Q4H   Infusions:  . [EXPIRED] .TPN (CLINIMIX-E) Adult 65 mL/hr at 07/19/12 1811  . Marland KitchenTPN (CLINIMIX-E) Adult 65 mL/hr at 07/20/12 1746  . 0.9 % NaCl with KCl 20 mEq / L 35 mL/hr at 07/20/12 1744    Insulin Requirements in the past 24 hours:  CBG range: 92-128 2 units SSI sensitive scale  Current Nutrition:  FLD starting today Clinimix E5/20 at 16ml/hr 20% Lipids at 73ml/hr on MWF  IVF: NS w/ 20KCl at 53ml/hr  Nutritional Goals:  RD recs (3/24): Kcal: 1400-1750, Protein: 66-80 grams, Fluid: 1.4-1.8 L Clinimix  5/20 at 65 ml/hr + Lipids MWF = 78 g protein/day and an average of 1579 kcal  Assessment: 67 y/o F recently hospitalized 3/12-3/14 for supraumbilical hernia repair and gastric biopsy, admitted with gastric perforation s/p repair on 07/13/12. PICC ordered 3/24 with TPN to start for ongoing/prolonged NPO status and gastric leak. NG tube in place. TNA has advanced slowly due to risk for refeeding syndrome and is now at goal.   Gastrografin study 3/25 shows no perforation.   Glucose: cont  at goal < 150, improved with SSI Electrolytes WNL, Corr Ca slightly elevated LFTs: WNL, albumin low TGs: WNL Prealbumin: improving, 8.1 (3/31), <3 (3/25)  3/31: D8 TNA, appears to be tolerating TNA.  Diet advanced to FLD.  Pt has been tolerating POs, calorie count pending.  Plan: at 18:00 tonight  Continue Clinimix E5/20 to 49ml/hr.  Would consider decreasing TNA rate to improve appetite following results of calorie count.  Continuing TNA at goal rate for now.  Fat emulsion at 10 ml/hr (MWF only due to ongoing shortage).   Switch from providing MVI and trace elements in TNA to MVI with minerals tablet since pt tolerating POs.  Continue CBGs q8h,  with sensitive scale SSI.  Maintain IVF at 72ml/hr  TNA lab panels on Mondays & Thursdays.  Follow up plan to wean/dc TNA  Clance Boll, PharmD, BCPS Pager: [phone] 07/21/2012 10:12 AM

## 2012-07-22 ENCOUNTER — Inpatient Hospital Stay (HOSPITAL_COMMUNITY): Payer: Medicare Other

## 2012-07-22 LAB — GLUCOSE, CAPILLARY
Glucose-Capillary: 113 mg/dL — ABNORMAL HIGH (ref 70–99)
Glucose-Capillary: 114 mg/dL — ABNORMAL HIGH (ref 70–99)

## 2012-07-22 MED ORDER — IOHEXOL 300 MG/ML  SOLN
150.0000 mL | Freq: Once | INTRAMUSCULAR | Status: AC | PRN
  Administered 2012-07-22: 120 mL via ORAL

## 2012-07-22 MED ORDER — CLINIMIX E/DEXTROSE (5/20) 5 % IV SOLN
INTRAVENOUS | Status: AC
  Administered 2012-07-22: 17:00:00 via INTRAVENOUS
  Filled 2012-07-22: qty 2000

## 2012-07-22 NOTE — Progress Notes (Signed)
Calorie Count Note  Pt NPO today. No intake recorded in envelope. Will monitor intake if/when diet advanced.   Levon Hedger MS, RD, LDN [phone] Pager [phone] After Hours Pager

## 2012-07-22 NOTE — Progress Notes (Signed)
Patient ID: Cynthia Bowen, female   DOB: September 23, 1945, 67 y.o.   MRN: [id number]  Gastrografin swallow has shown a tiny leak from her gastric repair which drains immediately into the right-sided JP drain. Patient remained stable.  I had a long conversation with the patient and her family to explain the current situation. I told them I would recommend that as long as she remains stable that I would not  reoperate but that with nutritional support and time this should heal around the drain and close without surgery. All questions were answered.  Mariella Saa MD, FACS  07/22/2012, 3:22 PM

## 2012-07-22 NOTE — Progress Notes (Signed)
Pt's nephew, Arturo Morton, requested update on pt status.  Joined nephew in pt room, pt gave consent for update. Updated nephew; nephew is satisfied with explanation.

## 2012-07-22 NOTE — Progress Notes (Signed)
PARENTERAL NUTRITION CONSULT NOTE - FOLLOW UP  Pharmacy Consult for TNA Indication: Recurrent gastric perforation, prolonged NPO status  Allergies  Allergen Reactions  . Avelox (Moxifloxacin Hcl In Nacl) Nausea And Vomiting  . Alendronate Sodium Nausea And Vomiting  . Aspirin Nausea Only  . Codeine Nausea And Vomiting  . Doxycycline   . Fluconazole   . Hydrocodone Nausea And Vomiting    GI distress  . Neurontin (Gabapentin) Other (See Comments)    Mood changes   . Nsaids Other (See Comments)    Severe gastritis & perforation - avoid NSAIDs when possible  . Sertraline Hcl Other (See Comments)    Hallucinations   . Sulfa Antibiotics Rash    Patient Measurements: Height: 5' 4.96" (165 cm) (Documented 07/02/12) Weight: 97 lb 10.6 oz (44.3 kg) IBW/kg (Calculated) : 56.91 Usual Weight: ~44kg (stable)  Vital Signs: Temp: 97.7 F (36.5 C) (04/01 0631) Temp src: Oral (04/01 0631) BP: 115/71 mmHg (04/01 0631) Pulse Rate: 110 (04/01 0631) Intake/Output from previous day: 03/31 0701 - 04/01 0700 In: 2231.8 [P.O.:1200; I.V.:981.8; IV Piggyback:50] Out: 3300 [Urine:3100; Drains:200] Intake/Output from this shift: Total I/O In: -  Out: 700 [Urine:700]  Labs:  Recent Labs  07/21/12 0415  WBC 11.0*  HGB 9.2*  HCT 27.0*  PLT 646*     Recent Labs  07/21/12 0415  NA 137  K 4.2  CL 99  CO2 32  GLUCOSE 85  BUN 13  CREATININE 0.51  CALCIUM 9.3  MG 1.9  PHOS 3.6  PROT 7.1  ALBUMIN 1.9*  AST 35  ALT 26  ALKPHOS 91  BILITOT 0.1*  PREALBUMIN 8.1*  TRIG 84  CHOL 76  Corrected Ca 10.66 (3/25) Estimated Creatinine Clearance: 49 ml/min (by C-G formula based on Cr of 0.51).    Medications:  Scheduled:  . acetaminophen  1,000 mg Oral TID  . enoxaparin (LOVENOX) injection  30 mg Subcutaneous Q24H  . ertapenem  1 g Intravenous Q24H  . famotidine  20 mg Oral BID  . feeding supplement  237 mL Oral TID WC  . FLUoxetine  40 mg Oral QAC breakfast  . insulin aspart   0-9 Units Subcutaneous Q8H  . lip balm  1 application Topical BID  . multivitamin with minerals  1 tablet Oral Daily  . pantoprazole (PROTONIX) IV  40 mg Intravenous Q12H  . saccharomyces boulardii  250 mg Oral BID  . [COMPLETED] vitamin A & D       Infusions:  . [EXPIRED] .TPN (CLINIMIX-E) Adult 65 mL/hr at 07/20/12 1746  . Marland KitchenTPN (CLINIMIX-E) Adult 65 mL/hr at 07/21/12 1722  . 0.9 % NaCl with KCl 20 mEq / L 35 mL/hr at 07/22/12 0603  . fat emulsion 240 mL (07/21/12 1722)    Insulin Requirements in the past 24 hours:  CBG range: 92-122 2 units SSI sensitive scale  Current Nutrition:  FLD starting 3/31 Clinimix E5/20 at 39ml/hr 20% Lipids at 27ml/hr on MWF  IVF: NS w/ 20KCl at 30ml/hr  Nutritional Goals:  RD recs (3/24): Kcal: 1400-1750, Protein: 66-80 grams, Fluid: 1.4-1.8 L Clinimix  5/20 at 65 ml/hr + Lipids MWF = 78 g protein/day and an average of 1579 kcal  Assessment: 67 y/o F recently hospitalized 3/12-3/14 for supraumbilical hernia repair and gastric biopsy, admitted with gastric perforation s/p repair on 07/13/12. PICC ordered 3/24 with TPN to start for ongoing/prolonged NPO status and gastric leak. NG tube in place. TNA has advanced slowly due to risk for refeeding syndrome  and is now at goal.   Gastrografin study 3/25 shows no perforation.   Glucose: at goal < 150, improved with SSI Electrolytes WNL, Corr Ca slightly elevated LFTs: WNL, albumin low TGs: WNL Prealbumin: improving, 8.1 (3/31), <3 (3/25)  4/1: D9 TNA, appears to be tolerating TNA.  Diet advanced to FLD yesterday.  Pt has been tolerating POs but intake remains inadequate.  May consider decreasing rate of TNA in order to help stimulate appetite but will continue TNA at goal rate for now as discussed with surgery since planning gastrografin study again today due to concern for persistent leak with drain.  Plan: at 18:00 tonight  Continue Clinimix E5/20 to 82ml/hr.    Fat emulsion at 10 ml/hr (MWF only  due to ongoing shortage).   Continue oral MVI with minerals tablet daily.  Continue CBGs q8h, with sensitive scale SSI.  Maintain IVF at 80ml/hr  TNA lab panels on Mondays & Thursdays.  F/u daily. CMET in AM.  Clance Boll, PharmD, BCPS Pager: [phone] 07/22/2012 10:37 AM

## 2012-07-22 NOTE — Progress Notes (Signed)
PT Cancellation Note  ___Treatment cancelled today due to medical issues with patient which prohibited therapy  ___ Treatment cancelled today due to patient receiving procedure or test   ___ Treatment cancelled today due to patient's refusal to participate   _X_ Treatment cancelled today due to pt request.  Just received some unpleasant news from the MD.  Felecia Shelling  PTA [facility]  Acute  Rehab Pager      [phone]

## 2012-07-22 NOTE — Progress Notes (Signed)
9 Days Post-Op  Subjective: Still with some abdominal pain Doesn't like the full liquids much - prefers the Ensure shakes  Calorie count - patient not taking enough PO to meet nutritional needs   Objective: Vital signs in last 24 hours: Temp:  [97.7 F (36.5 C)-100.3 F (37.9 C)] 97.7 F (36.5 C) (04/01 0631) Pulse Rate:  [85-110] 110 (04/01 0631) Resp:  [16-20] 16 (04/01 0631) BP: (100-133)/(52-76) 115/71 mmHg (04/01 0631) SpO2:  [94 %-100 %] 99 % (04/01 0631) Last BM Date: 07/21/12  Intake/Output from previous day: 03/31 0701 - 04/01 0700 In: 2231.8 [P.O.:1200; I.V.:981.8; IV Piggyback:50] Out: 3300 [Urine:3100; Drains:200] Intake/Output this shift: Total I/O In: -  Out: 700 [Urine:700]  General appearance: alert, cooperative and no distress GI: soft, incisional tenderness R drain - brownish milky drainage; minimal L drain output VAC removed - wound clean, granulating well; Lab Results:   Prealbumin 8.1  Recent Labs  07/21/12 0415  WBC 11.0*  HGB 9.2*  HCT 27.0*  PLT 646*   BMET  Recent Labs  07/21/12 0415  NA 137  K 4.2  CL 99  CO2 32  GLUCOSE 85  BUN 13  CREATININE 0.51  CALCIUM 9.3   PT/INR No results found for this basename: LABPROT, INR,  in the last 72 hours ABG No results found for this basename: PHART, PCO2, PO2, HCO3,  in the last 72 hours  Studies/Results: No results found.  Anti-infectives: Anti-infectives   Start     Dose/Rate Route Frequency Ordered Stop   07/14/12 1000  ertapenem (INVANZ) 1 g in sodium chloride 0.9 % 50 mL IVPB     1 g 100 mL/hr over 30 Minutes Intravenous Every 24 hours 07/14/12 0856     07/09/12 1000  vancomycin (VANCOCIN) 750 mg in sodium chloride 0.9 % 150 mL IVPB  Status:  Discontinued     750 mg 150 mL/hr over 60 Minutes Intravenous Every 24 hours 07/08/12 0818 07/11/12 0801   07/08/12 1600  vancomycin (VANCOCIN) 750 mg in sodium chloride 0.9 % 150 mL IVPB  Status:  Discontinued     750 mg 150 mL/hr  over 60 Minutes Intravenous Every 24 hours 07/07/12 1455 07/08/12 0818   07/08/12 0900  vancomycin (VANCOCIN) IVPB 1000 mg/200 mL premix     1,000 mg 200 mL/hr over 60 Minutes Intravenous  Once 07/08/12 0818 07/08/12 1150   07/08/12 0200  piperacillin-tazobactam (ZOSYN) IVPB 3.375 g  Status:  Discontinued     3.375 g 12.5 mL/hr over 240 Minutes Intravenous Every 8 hours 07/07/12 1722 07/14/12 0856   07/07/12 1730  [MAR Hold]  piperacillin-tazobactam (ZOSYN) IVPB 3.375 g     (On MAR Hold since 07/07/12 1724)   3.375 g 100 mL/hr over 30 Minutes Intravenous To Surgery 07/07/12 1722 07/07/12 1737   07/07/12 1430  piperacillin-tazobactam (ZOSYN) IVPB 3.375 g  Status:  Discontinued     3.375 g 12.5 mL/hr over 240 Minutes Intravenous Every 8 hours 07/07/12 1404 07/07/12 1722   07/07/12 1430  vancomycin (VANCOCIN) IVPB 1000 mg/200 mL premix  Status:  Discontinued     1,000 mg 200 mL/hr over 60 Minutes Intravenous  Once 07/07/12 1410 07/10/12 1246      Assessment/Plan: s/p Procedure(s): EXPLORATORY LAPAROTOMY repair gastric leak (N/A) Drain is still concerning for persistent leak - will obtain gastrografin swallow today Protein calorie malnutrition - Continue TNA for now - nutrition is improving with Prealbumin up to 8.1, but patient is not taking adequate PO's Continue antibiotics -  WBC trending down Continue PT, ambulation   LOS: 15 days    Vinton Layson K. 07/22/2012

## 2012-07-22 NOTE — Progress Notes (Signed)
Came to visit patient at bedside again today to discuss [facility] Care Management services. Patient again did not want to discuss discharge plans and if she is agreeable to [facility] Care Management services. Ms Sliwinski reports she does not know what her discharge plans will be. Spoke with inpatient RNCM regarding this visit. Will continue to follow.  Raiford Noble, MSN- Ed, RN,BSN, [facility], [phone]

## 2012-07-23 LAB — GLUCOSE, CAPILLARY
Glucose-Capillary: 115 mg/dL — ABNORMAL HIGH (ref 70–99)
Glucose-Capillary: 114 mg/dL — ABNORMAL HIGH (ref 70–99)

## 2012-07-23 LAB — CBC
MCH: 28.7 pg (ref 26.0–34.0)
MCV: 83.1 fL (ref 78.0–100.0)
Platelets: 673 10*3/uL — ABNORMAL HIGH (ref 150–400)
RDW: 16 % — ABNORMAL HIGH (ref 11.5–15.5)
WBC: 10.2 10*3/uL (ref 4.0–10.5)

## 2012-07-23 LAB — COMPREHENSIVE METABOLIC PANEL
Albumin: 2.1 g/dL — ABNORMAL LOW (ref 3.5–5.2)
BUN: 16 mg/dL (ref 6–23)
Creatinine, Ser: 0.62 mg/dL (ref 0.50–1.10)
Potassium: 4.4 mEq/L (ref 3.5–5.1)
Total Protein: 7.7 g/dL (ref 6.0–8.3)

## 2012-07-23 MED ORDER — FAT EMULSION 20 % IV EMUL
240.0000 mL | INTRAVENOUS | Status: AC
  Administered 2012-07-23: 240 mL via INTRAVENOUS
  Filled 2012-07-23: qty 250

## 2012-07-23 MED ORDER — FENTANYL 25 MCG/HR TD PT72
25.0000 ug | MEDICATED_PATCH | TRANSDERMAL | Status: DC
  Administered 2012-07-23: 25 ug via TRANSDERMAL
  Filled 2012-07-23: qty 1

## 2012-07-23 MED ORDER — TRACE MINERALS CR-CU-F-FE-I-MN-MO-SE-ZN IV SOLN
INTRAVENOUS | Status: AC
  Administered 2012-07-23: 17:00:00 via INTRAVENOUS
  Filled 2012-07-23: qty 2000

## 2012-07-23 NOTE — Progress Notes (Signed)
PARENTERAL NUTRITION CONSULT NOTE - FOLLOW UP  Pharmacy Consult for TNA Indication: Recurrent gastric perforation, prolonged NPO status  Allergies  Allergen Reactions  . Avelox (Moxifloxacin Hcl In Nacl) Nausea And Vomiting  . Alendronate Sodium Nausea And Vomiting  . Aspirin Nausea Only  . Codeine Nausea And Vomiting  . Doxycycline   . Fluconazole   . Hydrocodone Nausea And Vomiting    GI distress  . Neurontin (Gabapentin) Other (See Comments)    Mood changes   . Nsaids Other (See Comments)    Severe gastritis & perforation - avoid NSAIDs when possible  . Sertraline Hcl Other (See Comments)    Hallucinations   . Sulfa Antibiotics Rash    Patient Measurements: Height: 5' 4.96" (165 cm) (Documented 07/02/12) Weight: 97 lb 10.6 oz (44.3 kg) IBW/kg (Calculated) : 56.91 Usual Weight: ~44kg (stable)  Vital Signs: Temp: 98.4 F (36.9 C) (04/02 0512) Temp src: Oral (04/02 0512) BP: 94/59 mmHg (04/02 0512) Pulse Rate: 78 (04/02 0512) Intake/Output from previous day: 04/01 0701 - 04/02 0700 In: 896.4 [I.V.:846.4; IV Piggyback:50] Out: 2147 [Urine:2100; Drains:47] Intake/Output from this shift:    Labs:  Recent Labs  07/21/12 0415 07/23/12 0500  WBC 11.0* 10.2  HGB 9.2* 9.0*  HCT 27.0* 26.1*  PLT 646* 673*     Recent Labs  07/21/12 0415 07/23/12 0500  NA 137 137  K 4.2 4.4  CL 99 101  CO2 32 30  GLUCOSE 85 109*  BUN 13 16  CREATININE 0.51 0.62  CALCIUM 9.3 9.4  MG 1.9  --   PHOS 3.6  --   PROT 7.1 7.7  ALBUMIN 1.9* 2.1*  AST 35 46*  ALT 26 42*  ALKPHOS 91 97  BILITOT 0.1* 0.1*  PREALBUMIN 8.1*  --   TRIG 84  --   CHOL 76  --   Corrected Ca 10.66 (3/25) Estimated Creatinine Clearance: 49 ml/min (by C-G formula based on Cr of 0.62).    Medications:  Scheduled:  . acetaminophen  1,000 mg Oral TID  . enoxaparin (LOVENOX) injection  30 mg Subcutaneous Q24H  . ertapenem  1 g Intravenous Q24H  . fentaNYL  25 mcg Transdermal Q72H  . FLUoxetine   40 mg Oral QAC breakfast  . insulin aspart  0-9 Units Subcutaneous Q8H  . lip balm  1 application Topical BID  . multivitamin with minerals  1 tablet Oral Daily  . pantoprazole (PROTONIX) IV  40 mg Intravenous Q12H  . saccharomyces boulardii  250 mg Oral BID  . [DISCONTINUED] famotidine  20 mg Oral BID  . [DISCONTINUED] feeding supplement  237 mL Oral TID WC   Infusions:  . [EXPIRED] .TPN (CLINIMIX-E) Adult 65 mL/hr at 07/21/12 1722  . Marland KitchenTPN (CLINIMIX-E) Adult 65 mL/hr at 07/22/12 1710  . 0.9 % NaCl with KCl 20 mEq / L 35 mL/hr at 07/23/12 0614  . [EXPIRED] fat emulsion 240 mL (07/21/12 1722)    Insulin Requirements in the past 24 hours:  CBG range: 114-121 1 units SSI sensitive scale  Current Nutrition:  NPO again 4/1 Clinimix E5/20 at 66ml/hr 20% Lipids at 74ml/hr on MWF  IVF: NS w/ 20KCl at 30ml/hr  Nutritional Goals:  RD recs (3/24): Kcal: 1400-1750, Protein: 66-80 grams, Fluid: 1.4-1.8 L Clinimix  5/20 at 65 ml/hr + Lipids MWF = 78 g protein/day and an average of 1579 kcal  Assessment: 67 y/o F recently hospitalized 3/12-3/14 for supraumbilical hernia repair and gastric biopsy, admitted with gastric perforation s/p repair  on 07/13/12. PICC ordered 3/24 with TPN to start for ongoing/prolonged NPO status and gastric leak. NG tube in place. TNA has advanced slowly due to risk for refeeding syndrome and is now at goal.   Gastrografin study 3/25 shows no perforation. Repeat gastrografin study 4/1 shows leak.  Glucose: at goal < 150, improved with SSI Electrolytes WNL, Corr Ca slightly elevated LFTs: AST/ALT mildly elevated, albumin low TGs: WNL Prealbumin: improving, 8.1 (3/31), <3 (3/25)  4/2: D10 TNA, appears to be tolerating TNA.  Pt NPO again for persistent gastric fistula.  Plan is for continued drain and bowel rest for allow for gastric fistula to heal.  Pt will need home TNA.  Plan: at 18:00 tonight  Continue Clinimix E5/20 to 63ml/hr.    Fat emulsion at 10 ml/hr  (MWF only due to ongoing shortage).   Switch daily PO MVI back to providing standard multivitamins and trace elements in TNA (MWF only due to ongoing shortage) since MD wishes to minimize PO medications.  Continue CBGs q8h, with sensitive scale SSI.  Maintain IVF at 68ml/hr  TNA lab panels on Mondays & Thursdays.  F/u daily.  Clance Boll, PharmD, BCPS Pager: [phone] 07/23/2012 9:52 AM

## 2012-07-23 NOTE — Progress Notes (Signed)
10 Days Post-Op  Subjective: Less sore - pain better controlled NPO x ice chips  Patient understands plan for NPO, drain, bowel rest to allow gastric fistula to heal on its own.  Reoperation at this time would not be advisable, due to her poor nutrition.    Objective: Vital signs in last 24 hours: Temp:  [98.4 F (36.9 C)-98.9 F (37.2 C)] 98.4 F (36.9 C) (04/02 0512) Pulse Rate:  [78-105] 78 (04/02 0512) Resp:  [16-18] 16 (04/02 0512) BP: (94-134)/(59-81) 94/59 mmHg (04/02 0512) SpO2:  [97 %-99 %] 97 % (04/02 0512) Last BM Date: 07/21/12  Intake/Output from previous day: 04/01 0701 - 04/02 0700 In: 896.4 [I.V.:846.4; IV Piggyback:50] Out: 2147 [Urine:2100; Drains:47] Intake/Output this shift:    GI: soft, non-distended VAC with good seal R drain - less drainage; cloudy - brownish; minimal left drain output Lab Results:   Recent Labs  07/21/12 0415 07/23/12 0500  WBC 11.0* 10.2  HGB 9.2* 9.0*  HCT 27.0* 26.1*  PLT 646* 673*   BMET  Recent Labs  07/21/12 0415 07/23/12 0500  NA 137 137  K 4.2 4.4  CL 99 101  CO2 32 30  GLUCOSE 85 109*  BUN 13 16  CREATININE 0.51 0.62  CALCIUM 9.3 9.4   PT/INR No results found for this basename: LABPROT, INR,  in the last 72 hours ABG No results found for this basename: PHART, PCO2, PO2, HCO3,  in the last 72 hours  Studies/Results: Dg Ugi W/water Sol Cm  07/22/2012  *RADIOLOGY REPORT*  Clinical Data:  Status post gastric perforation with recurrent leak.  Increased volume of drainage from surgical drains.  UPPER GI SERIES WITH KUB  Technique:  Focused upper GI was performed with one 120 ml of Omnipaque contrast. Fluoroscopy Time: 6.2 minutes  Comparison:  07/15/2012 and CT of 07/13/2012  Findings: Pre procedure scout film demonstrates bilateral surgical drains.  Nonobstructive bowel gas pattern, without gross free intraperitoneal air.  Postcontrast images demonstrate normal caliber of the stomach. There is irregularity about  the gastric antrum.  No gross contrast extravasation is seen.  No well-defined perigastric contrast collection. Suspicion of extraluminal contrast adjacent the gastric antrum and pylorus on series 77.  Most apparent on series 61, is contrast entering the right-sided surgical drain.  This results in increased density within the drain on the final postprocedure radiograph.  IMPRESSION: Subtle findings which are consistent with contrast leak, presumably from the gastric antrum or pylorus, with small volume contrast draining into the right-sided surgical drain (only readily apparent on image/series 81).  These results will be called to the ordering clinician or representative by the Radiologist Assistant, and communication documented in the PACS Dashboard.   Original Report Authenticated By: Jeronimo Greaves, M.D.     Anti-infectives: Anti-infectives   Start     Dose/Rate Route Frequency Ordered Stop   07/14/12 1000  ertapenem (INVANZ) 1 g in sodium chloride 0.9 % 50 mL IVPB     1 g 100 mL/hr over 30 Minutes Intravenous Every 24 hours 07/14/12 0856     07/09/12 1000  vancomycin (VANCOCIN) 750 mg in sodium chloride 0.9 % 150 mL IVPB  Status:  Discontinued     750 mg 150 mL/hr over 60 Minutes Intravenous Every 24 hours 07/08/12 0818 07/11/12 0801   07/08/12 1600  vancomycin (VANCOCIN) 750 mg in sodium chloride 0.9 % 150 mL IVPB  Status:  Discontinued     750 mg 150 mL/hr over 60 Minutes Intravenous Every 24  hours 07/07/12 1455 07/08/12 0818   07/08/12 0900  vancomycin (VANCOCIN) IVPB 1000 mg/200 mL premix     1,000 mg 200 mL/hr over 60 Minutes Intravenous  Once 07/08/12 0818 07/08/12 1150   07/08/12 0200  piperacillin-tazobactam (ZOSYN) IVPB 3.375 g  Status:  Discontinued     3.375 g 12.5 mL/hr over 240 Minutes Intravenous Every 8 hours 07/07/12 1722 07/14/12 0856   07/07/12 1730  [MAR Hold]  piperacillin-tazobactam (ZOSYN) IVPB 3.375 g     (On MAR Hold since 07/07/12 1724)   3.375 g 100 mL/hr over 30  Minutes Intravenous To Surgery 07/07/12 1722 07/07/12 1737   07/07/12 1430  piperacillin-tazobactam (ZOSYN) IVPB 3.375 g  Status:  Discontinued     3.375 g 12.5 mL/hr over 240 Minutes Intravenous Every 8 hours 07/07/12 1404 07/07/12 1722   07/07/12 1430  vancomycin (VANCOCIN) IVPB 1000 mg/200 mL premix  Status:  Discontinued     1,000 mg 200 mL/hr over 60 Minutes Intravenous  Once 07/07/12 1410 07/10/12 1246      Assessment/Plan: s/p Procedure(s): EXPLORATORY LAPAROTOMY repair gastric leak (N/A) Persistent gastric fistula Will manage with NPO/ TNA/ drain Patient will need home TNA Will stop antibiotics after 10 days (day 8) Will try Fentanyl patch to help control her pain She may have ice chips and small sips of water with her PO meds - will minimize PO meds   LOS: 16 days    Aydin Hink K. 07/23/2012

## 2012-07-24 LAB — COMPREHENSIVE METABOLIC PANEL
AST: 43 U/L — ABNORMAL HIGH (ref 0–37)
Albumin: 2.1 g/dL — ABNORMAL LOW (ref 3.5–5.2)
Calcium: 9.8 mg/dL (ref 8.4–10.5)
Creatinine, Ser: 0.62 mg/dL (ref 0.50–1.10)
GFR calc non Af Amer: >90 mL/min (ref >90)

## 2012-07-24 LAB — GLUCOSE, CAPILLARY: Glucose-Capillary: 109 mg/dL — ABNORMAL HIGH (ref 70–99)

## 2012-07-24 LAB — PHOSPHORUS: Phosphorus: 4 mg/dL (ref 2.3–4.6)

## 2012-07-24 MED ORDER — CLINIMIX E/DEXTROSE (5/20) 5 % IV SOLN
INTRAVENOUS | Status: AC
  Administered 2012-07-24: 17:00:00 via INTRAVENOUS
  Filled 2012-07-24: qty 2000

## 2012-07-24 MED ORDER — FENTANYL 50 MCG/HR TD PT72
50.0000 ug | MEDICATED_PATCH | TRANSDERMAL | Status: DC
  Administered 2012-07-24: 50 ug via TRANSDERMAL
  Filled 2012-07-24: qty 1

## 2012-07-24 MED ORDER — HYDROCODONE-ACETAMINOPHEN 7.5-325 MG/15ML PO SOLN
10.0000 mL | ORAL | Status: DC | PRN
  Administered 2012-07-24 – 2012-07-26 (×6): 10 mL via ORAL
  Filled 2012-07-24 (×6): qty 15

## 2012-07-24 NOTE — Progress Notes (Signed)
PARENTERAL NUTRITION CONSULT NOTE - FOLLOW UP  Pharmacy Consult for TNA Indication: Recurrent gastric perforation, prolonged NPO status  Allergies  Allergen Reactions  . Avelox (Moxifloxacin Hcl In Nacl) Nausea And Vomiting  . Alendronate Sodium Nausea And Vomiting  . Aspirin Nausea Only  . Codeine Nausea And Vomiting  . Doxycycline   . Fluconazole   . Hydrocodone Nausea And Vomiting    GI distress  . Neurontin (Gabapentin) Other (See Comments)    Mood changes   . Nsaids Other (See Comments)    Severe gastritis & perforation - avoid NSAIDs when possible  . Sertraline Hcl Other (See Comments)    Hallucinations   . Sulfa Antibiotics Rash    Patient Measurements: Height: 5' 4.96" (165 cm) (Documented 07/02/12) Weight: 97 lb 10.6 oz (44.3 kg) IBW/kg (Calculated) : 56.91 Usual Weight: ~44kg (stable)  Vital Signs: Temp: 97.4 Bowen (36.3 C) (04/03 1039) Temp src: Oral (04/03 1039) BP: 102/64 mmHg (04/03 1039) Pulse Rate: 82 (04/03 1039) Intake/Output from previous day: 04/02 0701 - 04/03 0700 In: 5727.8 [I.V.:551.8; TPN:5176] Out: 1420 [Urine:1400; Drains:20] Intake/Output from this shift:    Labs:  Recent Labs  07/23/12 0500  WBC 10.2  HGB 9.0*  HCT 26.1*  PLT 673*     Recent Labs  07/23/12 0500 07/24/12 0535  NA 137 136  K 4.4 4.2  CL 101 101  CO2 30 29  GLUCOSE 109* 105*  BUN 16 17  CREATININE 0.62 0.62  CALCIUM 9.4 9.8  MG  --  1.8  PHOS  --  4.0  PROT 7.7 7.6  ALBUMIN 2.1* 2.1*  AST 46* 43*  ALT 42* 43*  ALKPHOS 97 96  BILITOT 0.1* 0.1*  Corrected Ca 10.66 (3/25) Estimated Creatinine Clearance: 49 ml/min (by C-G formula based on Cr of 0.62).    Medications:  Scheduled:  . enoxaparin (LOVENOX) injection  30 mg Subcutaneous Q24H  . fentaNYL  50 mcg Transdermal Q72H  . FLUoxetine  40 mg Oral QAC breakfast  . insulin aspart  0-9 Units Subcutaneous Q8H  . lip balm  1 application Topical BID  . pantoprazole (PROTONIX) IV  40 mg Intravenous  Q12H  . [DISCONTINUED] acetaminophen  1,000 mg Oral TID  . [DISCONTINUED] ertapenem  1 g Intravenous Q24H  . [DISCONTINUED] fentaNYL  25 mcg Transdermal Q72H  . [DISCONTINUED] saccharomyces boulardii  250 mg Oral BID   Infusions:  . [EXPIRED] .TPN (CLINIMIX-E) Adult 65 mL/hr at 07/22/12 1710  . Marland KitchenTPN (CLINIMIX-E) Adult 65 mL/hr at 07/23/12 1717  . 0.9 % NaCl with KCl 20 mEq / L 35 mL/hr (07/24/12 0641)  . fat emulsion 240 mL (07/23/12 1718)    Insulin Requirements in the past 24 hours:  CBG range: 109-115 0 units SSI sensitive scale  Current Nutrition:  NPO again 4/1 Clinimix E5/20 at 77ml/hr 20% Lipids at 67ml/hr on MWF  IVF: NS w/ 20KCl at 42ml/hr  Nutritional Goals:  RD recs (3/24): Kcal: 1400-1750, Protein: 66-80 grams, Fluid: 1.4-1.8 L Clinimix  5/20 at 65 ml/hr + Lipids MWF = 78 g protein/day and an average of 1579 kcal  Assessment: 67 y/o Bowen recently hospitalized 3/12-3/14 for supraumbilical hernia repair and gastric biopsy, admitted with gastric perforation s/p repair on 07/13/12. PICC ordered 3/24 with TPN to start for ongoing/prolonged NPO status and gastric leak. NG tube in place. TNA has advanced slowly due to risk for refeeding syndrome and is now at goal.   Gastrografin study 3/25 shows no perforation. Repeat gastrografin  study 4/1 shows leak.  Glucose: at goal < 150, improved with SSI Electrolytes WNL, Corr Ca slightly elevated at 11.32 LFTs: AST/ALT mildly elevated, albumin low TGs: WNL Prealbumin: improving, 8.1 (3/31), <3 (3/25)  4/3: D11 TNA, appears to be tolerating TNA.  Pt NPO again for persistent gastric fistula.  Plan is for continued drain and bowel rest for allow for gastric fistula to heal.  Pt will need home TNA. Plan is for discharge tomorrow.   Plan: at 18:00 tonight  Continue Clinimix E5/20 to 3ml/hr.    Fat emulsion at 10 ml/hr (MWF only due to ongoing shortage).   Switch daily PO MVI back to IV providing standard multivitamins and trace  elements in TNA (MWF only due to ongoing shortage) since MD wishes to minimize PO medications.  Continue CBGs q8h, with sensitive scale SSI.  Maintain IVF at 77ml/hr  TNA lab panels on Mondays & Thursdays.  Repeat BMET and Phos in AM   Jaylyne Breese, Loma Messing PharmD Pager #: [phone] 10:45 AM 07/24/2012

## 2012-07-24 NOTE — Progress Notes (Signed)
Physical Therapy Treatment Patient Details Name: Cynthia Bowen MRN: [id number] DOB: 1945-05-03 Today's Date: 07/24/2012 Time: 4098-1191 PT Time Calculation (min): 12 min  PT Assessment / Plan / Recommendation Comments on Treatment Session  Pt progressing well with mobility.  Highly motivated. Discouraged that "they want me to gain weight but they won't let me eat". Pt hopes to D/C to home soon.    Follow Up Recommendations  Home health PT;Supervision - Intermittent     Does the patient have the potential to tolerate intense rehabilitation     Barriers to Discharge        Equipment Recommendations  Rolling walker with 5" wheels    Recommendations for Other Services    Frequency Min 3X/week   Plan Discharge plan remains appropriate    Precautions / Restrictions Precautions Precautions: Fall Precaution Comments: NPO x ice chips Restrictions Weight Bearing Restrictions: No   Pertinent Vitals/Pain C/o mild R LQ ABD pain (dull/constant)    Mobility  Bed Mobility Bed Mobility: Supine to Sit Supine to Sit: 6: Modified independent (Device/Increase time) Sitting - Scoot to Edge of Bed: 6: Modified independent (Device/Increase time) Details for Bed Mobility Assistance: only required increased time Transfers Transfers: Sit to Stand;Stand to Sit Sit to Stand: 5: Supervision;From bed Stand to Sit: 5: Supervision;To chair/3-in-1 Details for Transfer Assistance: one VC for safety with multiple lines Ambulation/Gait Ambulation/Gait Assistance: 5: Supervision;4: Min guard Ambulation Distance (Feet): 350 Feet Assistive device: Rolling walker Ambulation/Gait Assistance Details: good upright posture and safety cognition using walker.  Mild c/o R LQ ABD pain at drain site (dull/constatnt)  Gait velocity: WFL     PT Goals Acute Rehab PT Goals PT Goal Formulation: With patient Pt will go Supine/Side to Sit: with supervision PT Goal: Supine/Side to Sit - Progress: Met Pt will go Sit  to Supine/Side: with supervision PT Goal: Sit to Supine/Side - Progress: Met Pt will go Sit to Stand: with supervision PT Goal: Sit to Stand - Progress: Met Pt will Ambulate: 51 - 150 feet;with supervision;with least restrictive assistive device PT Goal: Ambulate - Progress: Progressing toward goal Pt will Go Up / Down Stairs: 3-5 stairs;with supervision;with rail(s)  Visit Information  Last PT Received On: 07/24/12 Assistance Needed: +1    Subjective Data  Subjective: How am I suppose to gain weight if they don't let me eat Patient Stated Goal: home   Cognition    good   Balance   good  End of Session PT - End of Session Equipment Utilized During Treatment: Gait belt Activity Tolerance: Patient tolerated treatment well Patient left: in chair;with call bell/phone within reach   Felecia Shelling  PTA [facility]  Acute  Rehab Pager      [phone]

## 2012-07-24 NOTE — Progress Notes (Signed)
11 Days Post-Op  Subjective: Patient feeling better - pain better controlled with Fentanyl patch  Objective: Vital signs in last 24 hours: Temp:  [98 F (36.7 C)-99.2 F (37.3 C)] 98.4 F (36.9 C) (04/03 0556) Pulse Rate:  [69-96] 79 (04/03 0556) Resp:  [14-16] 16 (04/03 0556) BP: (97-127)/(57-71) 97/66 mmHg (04/03 0556) SpO2:  [99 %-100 %] 100 % (04/03 0556) Last BM Date: 07/22/12  Intake/Output from previous day: 04/02 0701 - 04/03 0700 In: 5727.8 [I.V.:551.8; TPN:5176] Out: 1420 [Urine:1400; Drains:20] Intake/Output this shift:    General appearance: alert, cooperative and no distress Resp: clear to auscultation bilaterally GI: soft, + BS; less cloudy R drain output, minimal L drain output Wound - granulating very well - much smaller  Lab Results:   Recent Labs  07/23/12 0500  WBC 10.2  HGB 9.0*  HCT 26.1*  PLT 673*   BMET  Recent Labs  07/23/12 0500 07/24/12 0535  NA 137 136  K 4.4 4.2  CL 101 101  CO2 30 29  GLUCOSE 109* 105*  BUN 16 17  CREATININE 0.62 0.62  CALCIUM 9.4 9.8   PT/INR No results found for this basename: LABPROT, INR,  in the last 72 hours ABG No results found for this basename: PHART, PCO2, PO2, HCO3,  in the last 72 hours  Studies/Results: Dg Ugi W/water Sol Cm  07/22/2012  *RADIOLOGY REPORT*  Clinical Data:  Status post gastric perforation with recurrent leak.  Increased volume of drainage from surgical drains.  UPPER GI SERIES WITH KUB  Technique:  Focused upper GI was performed with one 120 ml of Omnipaque contrast. Fluoroscopy Time: 6.2 minutes  Comparison:  07/15/2012 and CT of 07/13/2012  Findings: Pre procedure scout film demonstrates bilateral surgical drains.  Nonobstructive bowel gas pattern, without gross free intraperitoneal air.  Postcontrast images demonstrate normal caliber of the stomach. There is irregularity about the gastric antrum.  No gross contrast extravasation is seen.  No well-defined perigastric contrast  collection. Suspicion of extraluminal contrast adjacent the gastric antrum and pylorus on series 77.  Most apparent on series 69, is contrast entering the right-sided surgical drain.  This results in increased density within the drain on the final postprocedure radiograph.  IMPRESSION: Subtle findings which are consistent with contrast leak, presumably from the gastric antrum or pylorus, with small volume contrast draining into the right-sided surgical drain (only readily apparent on image/series 81).  These results will be called to the ordering clinician or representative by the Radiologist Assistant, and communication documented in the PACS Dashboard.   Original Report Authenticated By: Jeronimo Greaves, M.D.     Anti-infectives: Anti-infectives   Start     Dose/Rate Route Frequency Ordered Stop   07/14/12 1000  ertapenem (INVANZ) 1 g in sodium chloride 0.9 % 50 mL IVPB     1 g 100 mL/hr over 30 Minutes Intravenous Every 24 hours 07/14/12 0856     07/09/12 1000  vancomycin (VANCOCIN) 750 mg in sodium chloride 0.9 % 150 mL IVPB  Status:  Discontinued     750 mg 150 mL/hr over 60 Minutes Intravenous Every 24 hours 07/08/12 0818 07/11/12 0801   07/08/12 1600  vancomycin (VANCOCIN) 750 mg in sodium chloride 0.9 % 150 mL IVPB  Status:  Discontinued     750 mg 150 mL/hr over 60 Minutes Intravenous Every 24 hours 07/07/12 1455 07/08/12 0818   07/08/12 0900  vancomycin (VANCOCIN) IVPB 1000 mg/200 mL premix     1,000 mg 200 mL/hr over  60 Minutes Intravenous  Once 07/08/12 0818 07/08/12 1150   07/08/12 0200  piperacillin-tazobactam (ZOSYN) IVPB 3.375 g  Status:  Discontinued     3.375 g 12.5 mL/hr over 240 Minutes Intravenous Every 8 hours 07/07/12 1722 07/14/12 0856   07/07/12 1730  [MAR Hold]  piperacillin-tazobactam (ZOSYN) IVPB 3.375 g     (On MAR Hold since 07/07/12 1724)   3.375 g 100 mL/hr over 30 Minutes Intravenous To Surgery 07/07/12 1722 07/07/12 1737   07/07/12 1430  piperacillin-tazobactam  (ZOSYN) IVPB 3.375 g  Status:  Discontinued     3.375 g 12.5 mL/hr over 240 Minutes Intravenous Every 8 hours 07/07/12 1404 07/07/12 1722   07/07/12 1430  vancomycin (VANCOCIN) IVPB 1000 mg/200 mL premix  Status:  Discontinued     1,000 mg 200 mL/hr over 60 Minutes Intravenous  Once 07/07/12 1410 07/10/12 1246      Assessment/Plan: s/p Procedure(s): EXPLORATORY LAPAROTOMY repair gastric leak (N/A) Plan for discharge tomorrow D/C VAC - daily wet to dry dressings Lortab elixir along with Fentanyl patch for pain control D/C antibiotics Home TNA   LOS: 17 days    Cleotilde Spadaccini K. 07/24/2012

## 2012-07-24 NOTE — Progress Notes (Signed)
[facility]  Patient Status: New patient for [facility].    [facility] is providing the following services: Cynthia Bowen will receive home care services to include:  DME, SN, PT, and Infusion Pharmacy for home TNA.  [facility] Infusion Coordinator has provided pre hospital discharge teaching with pt and nephew, Cynthia Bowen, for home TNA.  [facility] performed full "mock" TNA set up and administration including pump operation with troubleshooting with good understanding and good technique.  [facility] will follow Cynthia Bowen and be prepared to support her D/C home.   If patient discharges after hours, please call [phone].   Sedalia Muta 07/24/2012, 2:29 PM

## 2012-07-25 LAB — BASIC METABOLIC PANEL
BUN: 19 mg/dL (ref 6–23)
Calcium: 10 mg/dL (ref 8.4–10.5)
GFR calc non Af Amer: >90 mL/min (ref >90)
Glucose, Bld: 96 mg/dL (ref 70–99)
Potassium: 4.3 mEq/L (ref 3.5–5.1)

## 2012-07-25 LAB — GLUCOSE, CAPILLARY
Glucose-Capillary: 108 mg/dL — ABNORMAL HIGH (ref 70–99)
Glucose-Capillary: 108 mg/dL — ABNORMAL HIGH (ref 70–99)

## 2012-07-25 MED ORDER — TRACE MINERALS CR-CU-F-FE-I-MN-MO-SE-ZN IV SOLN
INTRAVENOUS | Status: DC
  Administered 2012-07-25: 17:00:00 via INTRAVENOUS
  Filled 2012-07-25: qty 2000

## 2012-07-25 MED ORDER — FAT EMULSION 20 % IV EMUL
240.0000 mL | INTRAVENOUS | Status: DC
  Administered 2012-07-25: 240 mL via INTRAVENOUS
  Filled 2012-07-25: qty 250

## 2012-07-25 NOTE — Progress Notes (Signed)
PARENTERAL NUTRITION CONSULT NOTE - FOLLOW UP  Pharmacy Consult for TNA Indication: Recurrent gastric perforation, prolonged NPO status  Allergies  Allergen Reactions  . Avelox (Moxifloxacin Hcl In Nacl) Nausea And Vomiting  . Alendronate Sodium Nausea And Vomiting  . Aspirin Nausea Only  . Codeine Nausea And Vomiting  . Doxycycline   . Fluconazole   . Hydrocodone Nausea And Vomiting    GI distress  . Neurontin (Gabapentin) Other (See Comments)    Mood changes   . Nsaids Other (See Comments)    Severe gastritis & perforation - avoid NSAIDs when possible  . Sertraline Hcl Other (See Comments)    Hallucinations   . Sulfa Antibiotics Rash    Patient Measurements: Height: 5' 4.96" (165 cm) (Documented 07/02/12) Weight: 97 lb 10.6 oz (44.3 kg) IBW/kg (Calculated) : 56.91 Usual Weight: ~44kg (stable)  Vital Signs: Temp: 98.4 F (36.9 C) (04/04 0433) Temp src: Oral (04/04 0433) BP: 118/68 mmHg (04/04 0433) Pulse Rate: 87 (04/04 0433) Intake/Output from previous day: 04/03 0701 - 04/04 0700 In: 1280 [I.V.:1120; TPN:160] Out: 2115 [Urine:2100; Drains:15] Intake/Output from this shift:    Labs:  Recent Labs  07/23/12 0500  WBC 10.2  HGB 9.0*  HCT 26.1*  PLT 673*     Recent Labs  07/23/12 0500 07/24/12 0535 07/25/12 0427  NA 137 136 135  K 4.4 4.2 4.3  CL 101 101 99  CO2 30 29 30   GLUCOSE 109* 105* 96  BUN 16 17 19   CREATININE 0.62 0.62 0.66  CALCIUM 9.4 9.8 10.0  MG  --  1.8  --   PHOS  --  4.0 4.3  PROT 7.7 7.6  --   ALBUMIN 2.1* 2.1*  --   AST 46* 43*  --   ALT 42* 43*  --   ALKPHOS 97 96  --   BILITOT 0.1* 0.1*  --   Corrected Ca 10.66 (3/25) Estimated Creatinine Clearance: 49 ml/min (by C-G formula based on Cr of 0.66).    Medications:  Scheduled:  . enoxaparin (LOVENOX) injection  30 mg Subcutaneous Q24H  . fentaNYL  50 mcg Transdermal Q72H  . FLUoxetine  40 mg Oral QAC breakfast  . insulin aspart  0-9 Units Subcutaneous Q8H  . lip balm   1 application Topical BID  . pantoprazole (PROTONIX) IV  40 mg Intravenous Q12H  . [DISCONTINUED] acetaminophen  1,000 mg Oral TID  . [DISCONTINUED] ertapenem  1 g Intravenous Q24H  . [DISCONTINUED] fentaNYL  25 mcg Transdermal Q72H  . [DISCONTINUED] saccharomyces boulardii  250 mg Oral BID   Infusions:  . [EXPIRED] .TPN (CLINIMIX-E) Adult 65 mL/hr at 07/23/12 1717  . Marland KitchenTPN (CLINIMIX-E) Adult 65 mL/hr at 07/24/12 1718  . 0.9 % NaCl with KCl 20 mEq / L 35 mL/hr (07/24/12 0641)  . [EXPIRED] fat emulsion 240 mL (07/23/12 1718)    Insulin Requirements in the past 24 hours:  CBG range: 108-113 0 units SSI sensitive scale  Current Nutrition:  NPO again 4/1 Clinimix E5/20 at 64ml/hr 20% Lipids at 84ml/hr on MWF  IVF: NS w/ 20KCl at 45ml/hr  Nutritional Goals:  RD recs (3/24): Kcal: 1400-1750, Protein: 66-80 grams, Fluid: 1.4-1.8 L Clinimix  5/20 at 65 ml/hr + Lipids MWF = 78 g protein/day and an average of 1579 kcal  Assessment: 67 y/o F recently hospitalized 3/12-3/14 for supraumbilical hernia repair and gastric biopsy, admitted with gastric perforation s/p repair on 07/13/12. PICC ordered 3/24 with TPN to start for ongoing/prolonged NPO  status and gastric leak. NG tube in place. TNA has advanced slowly due to risk for refeeding syndrome and is now at goal.   Gastrografin study 3/25 shows no perforation. Repeat gastrografin study 4/1 shows leak.  Glucose: at goal < 150, improved with SSI Electrolytes  wnl except Corr Ca slightly trending up, phosphorus level trending up since start of TNA - Calcium-phosphorus product = 49.5 LFTs: AST/ALT mildly elevated, albumin low TGs: WNL Prealbumin: improving, 8.1 (3/31), <3 (3/25)  4/4: D12 TNA, appears to be tolerating TNA however, Calcium and phosphosus trending up.  Pt NPO again for persistent gastric fistula.  Plan is for continued drain and bowel rest for allow for gastric fistula to heal.  Pt will need home TNA. Plan is for discharge  tomorrow.   Plan: at 18:00 tonight  Continue Clinimix E5/20 to 73ml/hr - Given trend up in calcium and phosphorus, repeat BMET and phos in AM.  Please allow pharmacy to address AM labs on 4/5 prior to discharging patient to ensure no changes in TNA are needed.    Fat emulsion at 10 ml/hr (MWF only due to ongoing shortage).   Switch daily PO MVI back to IV providing standard multivitamins and trace elements in TNA (MWF only due to ongoing shortage) since MD wishes to minimize PO medications.  Continue CBGs q8h, with sensitive scale SSI.  Maintain IVF at 72ml/hr  TNA lab panels on Mondays & Thursdays.  Repeat BMET and Phos in AM   Jeven Topper, Loma Messing PharmD Pager #: [phone] 9:31 AM 07/25/2012

## 2012-07-25 NOTE — Progress Notes (Signed)
Fourth visit made by [facility] Care Management personnel to attempt to engage patient for services because her being a COPD gold patient. She declines services for [facility] Care Management. Left brochure for her to call if she becomes interested in future.  Raiford Noble, MSN-Ed, RN,BSN, [facility] [phone]

## 2012-07-25 NOTE — Progress Notes (Signed)
12 Days Post-Op  Subjective: Pain better controlled with higher dose Fentanyl patch Only took two doses of Lortab elixir for breakthrough pain + BM Less drain output  Objective: Vital signs in last 24 hours: Temp:  [97.4 F (36.3 C)-98.6 F (37 C)] 98.4 F (36.9 C) (04/04 0433) Pulse Rate:  [77-91] 87 (04/04 0433) Resp:  [16-18] 16 (04/04 0433) BP: (87-118)/(55-70) 118/68 mmHg (04/04 0433) SpO2:  [96 %-99 %] 96 % (04/04 0433) Last BM Date: 07/22/12  Intake/Output from previous day: 04/03 0701 - 04/04 0700 In: 1280 [I.V.:1120; TPN:160] Out: 2115 [Urine:2100; Drains:15] Intake/Output this shift:    General appearance: alert, cooperative and no distress GI: Soft, incisional tenderness Milky drainage in remaining drain on Right Wound - clean, granulating well   Lab Results:   Recent Labs  07/23/12 0500  WBC 10.2  HGB 9.0*  HCT 26.1*  PLT 673*   BMET  Recent Labs  07/24/12 0535 07/25/12 0427  NA 136 135  K 4.2 4.3  CL 101 99  CO2 29 30  GLUCOSE 105* 96  BUN 17 19  CREATININE 0.62 0.66  CALCIUM 9.8 10.0   PT/INR No results found for this basename: LABPROT, INR,  in the last 72 hours ABG No results found for this basename: PHART, PCO2, PO2, HCO3,  in the last 72 hours  Studies/Results: No results found.  Anti-infectives: Anti-infectives   Start     Dose/Rate Route Frequency Ordered Stop   07/14/12 1000  ertapenem (INVANZ) 1 g in sodium chloride 0.9 % 50 mL IVPB  Status:  Discontinued     1 g 100 mL/hr over 30 Minutes Intravenous Every 24 hours 07/14/12 0856 07/24/12 1032   07/09/12 1000  vancomycin (VANCOCIN) 750 mg in sodium chloride 0.9 % 150 mL IVPB  Status:  Discontinued     750 mg 150 mL/hr over 60 Minutes Intravenous Every 24 hours 07/08/12 0818 07/11/12 0801   07/08/12 1600  vancomycin (VANCOCIN) 750 mg in sodium chloride 0.9 % 150 mL IVPB  Status:  Discontinued     750 mg 150 mL/hr over 60 Minutes Intravenous Every 24 hours 07/07/12 1455  07/08/12 0818   07/08/12 0900  vancomycin (VANCOCIN) IVPB 1000 mg/200 mL premix     1,000 mg 200 mL/hr over 60 Minutes Intravenous  Once 07/08/12 0818 07/08/12 1150   07/08/12 0200  piperacillin-tazobactam (ZOSYN) IVPB 3.375 g  Status:  Discontinued     3.375 g 12.5 mL/hr over 240 Minutes Intravenous Every 8 hours 07/07/12 1722 07/14/12 0856   07/07/12 1730  [MAR Hold]  piperacillin-tazobactam (ZOSYN) IVPB 3.375 g     (On MAR Hold since 07/07/12 1724)   3.375 g 100 mL/hr over 30 Minutes Intravenous To Surgery 07/07/12 1722 07/07/12 1737   07/07/12 1430  piperacillin-tazobactam (ZOSYN) IVPB 3.375 g  Status:  Discontinued     3.375 g 12.5 mL/hr over 240 Minutes Intravenous Every 8 hours 07/07/12 1404 07/07/12 1722   07/07/12 1430  vancomycin (VANCOCIN) IVPB 1000 mg/200 mL premix  Status:  Discontinued     1,000 mg 200 mL/hr over 60 Minutes Intravenous  Once 07/07/12 1410 07/10/12 1246      Assessment/Plan: s/p Procedure(s): EXPLORATORY LAPAROTOMY repair gastric leak (N/A) Plan for discharge tomorrow Patient wants to wait one more day at least before discharge.  She is very apprehensive about going home with TNA and NPO status.  LOS: 18 days    Cynthia Bowen K. 07/25/2012

## 2012-07-25 NOTE — Progress Notes (Signed)
NUTRITION FOLLOW UP  Intervention:   - TPN per pharmacy, recommend close monitoring of LFTs - Anti-emetics per MD - Will continue to monitor  Nutrition Dx:   Inadequate oral intake related to inability to eat as evidenced by NPO; ongoing  Goal:   Pt to meet >/= 90% of their estimated nutrition needs - met  Monitor:   Weights, labs, TPN  Assessment:   Pt NPO with plan for bowel rest to allow gastric fistula to heal on its own per surgery. No weights recorded since 3/24. CBGs < 150 mg/dL. AST/ALT midly elevated. Prealbumin improving from < 3 mg/dL on 9/60 to 8.1 mg/dL on 4/54. Plan is for d/c tomorrow with home TPN. Met with pt who c/o nausea which pt thinks is from her medications, RN aware.   TPN: Clinimix E 5/20 @ 65 ml/hr.  Lipids (20% IVFE @ 10 ml/hr), multivitamins, and trace elements are provided 3 times weekly (MWF) due to national backorder.  Provides 1579 kcal and 78 grams protein daily (based on weekly average).  Meets 110% minimum estimated kcal and 97% minimum estimated protein needs.  Additional IVF with NS @ 35 ml/hr.  Height: Ht Readings from Last 1 Encounters:  07/07/12 5' 4.96" (1.65 m)    Weight Status:   Wt Readings from Last 1 Encounters:  07/14/12 97 lb 10.6 oz (44.3 kg)    Re-estimated needs:  Kcal: 1440-1750 Protein: 80-90g Fluid: 1.4-1.7L/day  Skin: Abdominal wound VAC, JP drain  Diet Order: NPO   Intake/Output Summary (Last 24 hours) at 07/25/12 0951 Last data filed at 07/25/12 0600  Gross per 24 hour  Intake   1280 ml  Output   2115 ml  Net   -835 ml    Last BM: 4/1   Labs:   Recent Labs Lab 07/21/12 0415 07/23/12 0500 07/24/12 0535 07/25/12 0427  NA 137 137 136 135  K 4.2 4.4 4.2 4.3  CL 99 101 101 99  CO2 32 30 29 30   BUN 13 16 17 19   CREATININE 0.51 0.62 0.62 0.66  CALCIUM 9.3 9.4 9.8 10.0  MG 1.9  --  1.8  --   PHOS 3.6  --  4.0 4.3  GLUCOSE 85 109* 105* 96    CBG (last 3)   Recent Labs  07/24/12 1614  07/24/12 2327 07/25/12 0731  GLUCAP 109* 113* 108*    Scheduled Meds: . enoxaparin (LOVENOX) injection  30 mg Subcutaneous Q24H  . fentaNYL  50 mcg Transdermal Q72H  . FLUoxetine  40 mg Oral QAC breakfast  . insulin aspart  0-9 Units Subcutaneous Q8H  . lip balm  1 application Topical BID  . pantoprazole (PROTONIX) IV  40 mg Intravenous Q12H    Continuous Infusions: . Marland KitchenTPN (CLINIMIX-E) Adult 65 mL/hr at 07/24/12 1718  . 0.9 % NaCl with KCl 20 mEq / L 35 mL/hr (07/24/12 0641)     Levon Hedger MS, RD, LDN [phone] Pager [phone] After Hours Pager

## 2012-07-25 NOTE — Progress Notes (Signed)
Cancelled PT Tx at patient request. "This is not a good time" Felecia Shelling  PTA [facility]  Acute  Rehab Pager      [phone]

## 2012-07-26 LAB — BASIC METABOLIC PANEL
BUN: 19 mg/dL (ref 6–23)
Chloride: 99 mEq/L (ref 96–112)
GFR calc Af Amer: >90 mL/min (ref >90)
Potassium: 4.1 mEq/L (ref 3.5–5.1)
Sodium: 135 mEq/L (ref 135–145)

## 2012-07-26 LAB — GLUCOSE, CAPILLARY: Glucose-Capillary: 113 mg/dL — ABNORMAL HIGH (ref 70–99)

## 2012-07-26 MED ORDER — HYDROCODONE-ACETAMINOPHEN 7.5-325 MG/15ML PO SOLN: 10.0000 mL | ORAL | Status: DC | PRN

## 2012-07-26 MED ORDER — FENTANYL 50 MCG/HR TD PT72: 5.0000 | MEDICATED_PATCH | TRANSDERMAL | Status: DC

## 2012-07-26 NOTE — Progress Notes (Signed)
Asked by RN to arrange ambulance transport home for Pt.  Noted no d/c summary.  Per RN, Pt d/c'd by Dr. Gerrit Friends today, however RN noted no d/c summary.  CSW confirmed Pt's address as [address] [place], [place].  Arranged for ambulance transport.  Providence Crosby, LCSWA Clinical Social Work [phone]

## 2012-07-26 NOTE — Progress Notes (Signed)
Patient ID: Cynthia Bowen, female   DOB: Feb 28, 1946, 67 y.o.   MRN: [id number]  General Surgery - [facility] - Progress Note  Subjective: Patient in good spirits.  Wants to go home today.  No complaints.  Objective: Vital signs in last 24 hours: Temp:  [97.8 F (36.6 C)-98.8 F (37.1 C)] 97.8 F (36.6 C) (04/05 0648) Pulse Rate:  [80-100] 86 (04/05 0648) Resp:  [18-20] 18 (04/05 0648) BP: (92-110)/(58-64) 101/64 mmHg (04/05 0648) SpO2:  [94 %-100 %] 100 % (04/05 0648) Last BM Date: 07/23/12  Intake/Output from previous day: 04/04 0701 - 04/05 0700 In: 3989.9 [I.V.:849.9; TPN:3140] Out: 1275 [Urine:1250; Drains:25]  Exam: HEENT - clear, not icteric Neck - soft Chest - clear bilaterally Cor - RRR, no murmur Abd - soft without distension; drain with small cloudy fluid; dressing intact Ext - no significant edema; PICC in right upper arm Neuro - grossly intact, no focal deficits  Lab Results:  No results found for this basename: WBC, HGB, HCT, PLT,  in the last 72 hours   Recent Labs  07/25/12 0427 07/26/12 0605  NA 135 135  K 4.3 4.1  CL 99 99  CO2 30 28  GLUCOSE 96 92  BUN 19 19  CREATININE 0.66 0.65  CALCIUM 10.0 9.8    Studies/Results: No results found.  Assessment / Plan: 1.  Status post gastric biopsy  NPO on TNA  Drain in place  Plan discharge home on TNA, wound care  Home health arranged  Will need ambulance transfer to home  Velora Heckler, MD, [facility] Office: [phone]  07/26/2012

## 2012-07-26 NOTE — Care Management (Signed)
Patient states DME purchased for home use independently. Plans to discharge with nephew assisting in home care. MD orders for [facility] entered. [facility] notified of pt's discharge. Start of care within 24-48 hours.   Roxy Manns Alizia Greif,RN,BSN [phone]

## 2012-07-26 NOTE — Discharge Summary (Signed)
Physician Discharge Summary  Patient ID: Cynthia Bowen MRN: [id number] DOB/AGE: 24-Mar-1946 67 y.o.  Admit date: 07/07/2012 Discharge date: 07/26/2012  Admission Diagnoses:  Gastric perforation with abscess    Severe protein calorie malnutrition      Discharge Diagnoses:  Principal Problem:   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 [id number] Active Problems:   Depression   GERD (gastroesophageal reflux disease)   Osteoarthritis   Fibromyalgia   Back pain   COPD (chronic obstructive pulmonary disease)   Unspecified gastritis and gastroduodenitis without mention of hemorrhage   Loss of weight   Anorexia   Incarcerated epigastric hernia s/p primary repair 07/02/2012   Protein-calorie malnutrition, severe   Discharged Condition:  Improved  Hospital Course: The patient was readmitted on 07/07/12 with peritonitis, an acute abdomen, and CT evidence of a leak at her previous gastric biopsy site.  She was brought to the operating room that afternoon and underwent exploratory laparotomy, drainage of the intraperitoneal abscess, and repair of the gastric perforation with omental patch.  Her wound was left packed open.  She was kept on NPO status for several days, with TNA nutritional support.  Her prealbumin level was less than 3.0, indicating severe protein-calorie malnutrition.  She was kept on IV antibiotics during this time.  She did have some urinary retention, which resolved after a couple of days.   On POD#3, she had a gastrografin swallow which was negative for leak, so her NG tube was removed.  She began having bowel movements on POD#4.  On POD#6, she was noted to have an elevated WBC at 31.  Her abdominal pain worsened, and the on-call physician that day, Dr. Johna Sheriff, brought her back to the operating room on 3/23, where he drained the intraperitoneal abscess, replaced the drains, and repaired the leak with suture, Tisseel, and omental patch.  Her  antibiotics were changed to Invanz.  Her nutrition improved slightly to a prealbumin of 8, which is still quite poor.  She was again kept on NPO status with TNA.  Her wound was addressed with a wound VAC.  On POD#2, her drainage appeared bilious, so I obtained a gastrografin swallow.  This was read by radiology as negative for any leak.  She inadvertently removed her NG tube on POD#3, but was kept NPO on TNA.  Her WBC continued to decrease and eventually returned to normal.  She was slowly started back on clear liquids a week after surgery and was started on a calorie count.  This eventually showed that she was not taking enough calories to maintain adequate nutrition.  Her right drain began putting out cloudy material, so a gastrografin UGI was repeated on 07/22/12.  This showed a small amount of contrast seeping through the area of the omental patch, but the leak was controlled by the drain.  We made the decision to treat this non-operatively, as she would be high-risk for recurrent leak for any operation due to her malnutrition.  Arrangements were made for home health for TNA, physical therapy, and daily wet to dry dressing changes.  Her pain regimen was adjusted and her pain is controlled fairly well with Fentanyl patches and only occasional Lortab elixir.  Her nephew was trained on TNA infusion.  Her left drain was removed.  Antibiotics were stopped after her WBC normalized.  She was finally ready for discharge on 07/26/12.    Consults: Physical Therapy   Pharmacy - nutritional support    Significant Diagnostic Studies: Gastrografin swallow -  as described above  Treatments: Surgical procedures as described above  Discharge Exam: Blood pressure 101/64, pulse 86, temperature 97.8 F (36.6 C), temperature source Oral, resp. rate 18, height 5' 4.96" (1.65 m), weight 97 lb 10.6 oz (44.3 kg), SpO2 100.00%. Thin female in NAD Abd - soft, incisional tenderness; no peritonitis; +BS Wound - clean, very narrow,  well-granulated Right JP drain - minimal cloudy drainage  Disposition: 01-Home or Self Care  Discharge Orders   Future Orders Complete By Expires     Discharge wound care:  As directed     Comments:      Dressing change to abdomen daily.  JP drain care as instructed.  PICC line care per HHN.    Increase activity slowly  As directed         Medication List    STOP taking these medications       clarithromycin 500 MG 24 hr tablet  Commonly known as:  BIAXIN XL     oxyCODONE-acetaminophen 5-325 MG per tablet  Commonly known as:  PERCOCET/ROXICET      TAKE these medications       albuterol 108 (90 BASE) MCG/ACT inhaler  Commonly known as:  PROVENTIL HFA;VENTOLIN HFA  Inhale 2 puffs into the lungs every 4 (four) hours as needed for wheezing (cough, shortness of breath or wheezing.).     dextromethorphan-guaiFENesin 30-600 MG per 12 hr tablet  Commonly known as:  MUCINEX DM  Take 1 tablet by mouth every 12 (twelve) hours.     diphenhydrAMINE 25 mg capsule  Commonly known as:  BENADRYL  Take 25-50 mg by mouth every 6 (six) hours as needed. Itching     EPINEPHrine 0.3 mg/0.3 mL Devi  Commonly known as:  EPIPEN  Inject 0.3 mLs (0.3 mg total) into the muscle once.     fentaNYL 50 MCG/HR  Commonly known as:  DURAGESIC - dosed mcg/hr  Place 5 patches (250 mcg total) onto the skin every 3 (three) days.     FLUoxetine 20 MG capsule  Commonly known as:  PROZAC  Take 20 mg by mouth daily before breakfast.     HYDROcodone-acetaminophen 7.5-325 mg/15 ml solution  Commonly known as:  HYCET  Take 10 mLs by mouth every 4 (four) hours as needed.     multivitamin tablet  Take 1 tablet by mouth daily.     ondansetron 8 MG disintegrating tablet  Commonly known as:  ZOFRAN-ODT  Take 1 tablet (8 mg total) by mouth every 8 (eight) hours as needed. For nausea     pantoprazole 40 MG tablet  Commonly known as:  PROTONIX  Take 40 mg by mouth daily.     promethazine 12.5 MG tablet   Commonly known as:  PHENERGAN  Take 1 tablet (12.5 mg total) by mouth every 6 (six) hours as needed for nausea.     ranitidine 150 MG tablet  Commonly known as:  ZANTAC  Take 150 mg by mouth 2 (two) times daily.     triamcinolone cream 0.1 %  Commonly known as:  KENALOG  Apply 1 application topically as needed (as needed for right ear pain).         Signed: Naava Janeway K. 07/26/2012, 1:31 PM

## 2012-07-29 ENCOUNTER — Other Ambulatory Visit (INDEPENDENT_AMBULATORY_CARE_PROVIDER_SITE_OTHER): Payer: Self-pay | Admitting: Surgery

## 2012-07-29 DIAGNOSIS — R109 Unspecified abdominal pain: Secondary | ICD-10-CM

## 2012-07-29 DIAGNOSIS — K255 Chronic or unspecified gastric ulcer with perforation: Secondary | ICD-10-CM

## 2012-07-30 ENCOUNTER — Telehealth: Payer: Self-pay

## 2012-07-30 NOTE — Telephone Encounter (Signed)
PATIENT STATES SHE RECENTLY HAD SURGERY AND IS RECEIVING IN HOME HEALTHCARE SERVICES. SHE WAS ADVISED TO CHECK HER SUGAR LEVEL EVERYDAY. PATIENT JUST WANTS TO INFORM us OF THIS. PATIENT SEES DR Merla Riches. [phone]

## 2012-07-31 ENCOUNTER — Telehealth (INDEPENDENT_AMBULATORY_CARE_PROVIDER_SITE_OTHER): Payer: Self-pay | Admitting: General Surgery

## 2012-07-31 ENCOUNTER — Other Ambulatory Visit (INDEPENDENT_AMBULATORY_CARE_PROVIDER_SITE_OTHER): Payer: Self-pay | Admitting: Surgery

## 2012-07-31 DIAGNOSIS — K9189 Other postprocedural complications and disorders of digestive system: Secondary | ICD-10-CM

## 2012-07-31 NOTE — Telephone Encounter (Signed)
Spoke with this patient Cynthia Bowen she states that home health is coming out to see her. I called [facility] spoke with [facility] and she say this patient is being seen by  [facility] agency

## 2012-08-04 ENCOUNTER — Encounter (HOSPITAL_COMMUNITY): Payer: Self-pay

## 2012-08-04 ENCOUNTER — Emergency Department (HOSPITAL_COMMUNITY): Payer: Medicare Other

## 2012-08-04 ENCOUNTER — Inpatient Hospital Stay (HOSPITAL_COMMUNITY)
Admission: EM | Admit: 2012-08-04 | Discharge: 2012-08-14 | DRG: 175 | Disposition: A | Discharge status: Home Health | Payer: Medicare Other | Attending: Surgery | Admitting: Surgery

## 2012-08-04 DIAGNOSIS — I2699 Other pulmonary embolism without acute cor pulmonale: Principal | ICD-10-CM

## 2012-08-04 DIAGNOSIS — M549 Dorsalgia, unspecified: Secondary | ICD-10-CM

## 2012-08-04 DIAGNOSIS — Z87891 Personal history of nicotine dependence: Secondary | ICD-10-CM

## 2012-08-04 DIAGNOSIS — F329 Major depressive disorder, single episode, unspecified: Secondary | ICD-10-CM | POA: Diagnosis present

## 2012-08-04 DIAGNOSIS — Y849 Medical procedure, unspecified as the cause of abnormal reaction of the patient, or of later complication, without mention of misadventure at the time of the procedure: Secondary | ICD-10-CM | POA: Diagnosis present

## 2012-08-04 DIAGNOSIS — F19921 Other psychoactive substance use, unspecified with intoxication with delirium: Secondary | ICD-10-CM | POA: Diagnosis present

## 2012-08-04 DIAGNOSIS — J449 Chronic obstructive pulmonary disease, unspecified: Secondary | ICD-10-CM | POA: Diagnosis present

## 2012-08-04 DIAGNOSIS — R042 Hemoptysis: Secondary | ICD-10-CM | POA: Diagnosis not present

## 2012-08-04 DIAGNOSIS — F3289 Other specified depressive episodes: Secondary | ICD-10-CM | POA: Diagnosis present

## 2012-08-04 DIAGNOSIS — G9349 Other encephalopathy: Secondary | ICD-10-CM | POA: Diagnosis present

## 2012-08-04 DIAGNOSIS — I959 Hypotension, unspecified: Secondary | ICD-10-CM | POA: Diagnosis present

## 2012-08-04 DIAGNOSIS — Z981 Arthrodesis status: Secondary | ICD-10-CM

## 2012-08-04 DIAGNOSIS — K219 Gastro-esophageal reflux disease without esophagitis: Secondary | ICD-10-CM | POA: Diagnosis present

## 2012-08-04 DIAGNOSIS — D638 Anemia in other chronic diseases classified elsewhere: Secondary | ICD-10-CM | POA: Diagnosis present

## 2012-08-04 DIAGNOSIS — Z9889 Other specified postprocedural states: Secondary | ICD-10-CM

## 2012-08-04 DIAGNOSIS — J4489 Other specified chronic obstructive pulmonary disease: Secondary | ICD-10-CM | POA: Diagnosis present

## 2012-08-04 DIAGNOSIS — K3189 Other diseases of stomach and duodenum: Secondary | ICD-10-CM

## 2012-08-04 DIAGNOSIS — K659 Peritonitis, unspecified: Secondary | ICD-10-CM | POA: Diagnosis present

## 2012-08-04 DIAGNOSIS — D72829 Elevated white blood cell count, unspecified: Secondary | ICD-10-CM

## 2012-08-04 DIAGNOSIS — Z86711 Personal history of pulmonary embolism: Secondary | ICD-10-CM | POA: Diagnosis present

## 2012-08-04 DIAGNOSIS — K9429 Other complications of gastrostomy: Secondary | ICD-10-CM | POA: Diagnosis present

## 2012-08-04 DIAGNOSIS — Z681 Body mass index (BMI) 19 or less, adult: Secondary | ICD-10-CM

## 2012-08-04 DIAGNOSIS — IMO0001 Reserved for inherently not codable concepts without codable children: Secondary | ICD-10-CM | POA: Diagnosis present

## 2012-08-04 DIAGNOSIS — R630 Anorexia: Secondary | ICD-10-CM

## 2012-08-04 DIAGNOSIS — F05 Delirium due to known physiological condition: Secondary | ICD-10-CM | POA: Diagnosis present

## 2012-08-04 DIAGNOSIS — E876 Hypokalemia: Secondary | ICD-10-CM | POA: Diagnosis present

## 2012-08-04 DIAGNOSIS — J90 Pleural effusion, not elsewhere classified: Secondary | ICD-10-CM

## 2012-08-04 DIAGNOSIS — R109 Unspecified abdominal pain: Secondary | ICD-10-CM | POA: Diagnosis present

## 2012-08-04 DIAGNOSIS — E871 Hypo-osmolality and hyponatremia: Secondary | ICD-10-CM | POA: Diagnosis present

## 2012-08-04 DIAGNOSIS — E43 Unspecified severe protein-calorie malnutrition: Secondary | ICD-10-CM | POA: Diagnosis present

## 2012-08-04 DIAGNOSIS — R079 Chest pain, unspecified: Secondary | ICD-10-CM

## 2012-08-04 DIAGNOSIS — K316 Fistula of stomach and duodenum: Secondary | ICD-10-CM | POA: Diagnosis present

## 2012-08-04 MED ORDER — IOHEXOL 300 MG/ML  SOLN: 50.0000 mL | Freq: Once | INTRAMUSCULAR | Status: AC | PRN

## 2012-08-04 MED ORDER — FENTANYL CITRATE 0.05 MG/ML IJ SOLN
50.0000 ug | INTRAMUSCULAR | Status: AC | PRN
  Administered 2012-08-04: 50 ug via INTRAVENOUS
  Administered 2012-08-05: 03:00:00 via INTRAVENOUS
  Filled 2012-08-04 (×2): qty 2

## 2012-08-04 MED ORDER — SODIUM CHLORIDE 0.9 % IV SOLN
INTRAVENOUS | Status: AC
  Administered 2012-08-04: 23:00:00 via INTRAVENOUS

## 2012-08-04 NOTE — ED Notes (Signed)
ZOX:[id number]<AV> Expected date:08/04/12<BR> Expected time: 7:50 PM<BR> Means of arrival:Ambulance<BR> Comments:<BR> Left sided pain, hx pneumonia

## 2012-08-04 NOTE — ED Notes (Signed)
Unsuccessfully attempted to obtain blood for labs.RN made aware 

## 2012-08-04 NOTE — ED Provider Notes (Signed)
History     CSN: [id number]  Arrival date & time 08/04/12  2010   First MD Initiated Contact with Patient 08/04/12 2130      Chief Complaint  Patient presents with  . Chest Pain  . Abdominal Pain     HPI Pt was seen at 2140.   Per pt, c/o gradual onset and worsening of persistent left sided chest and abd "pain" that began this morning approx 0300.  Pt states the pain woke her up from sleep.  Describes the pain as "sharp" and "constant," worse with palpation of the areas.  Pt also c/o gradual onset and persistence of constant dysuria for the past several days.  Denies flank pain, no N/V/D, no palpitations, no cough, no back pain.     Past Medical History  Diagnosis Date  . COPD (chronic obstructive pulmonary disease)   . Pneumonia 12-2011  . GERD (gastroesophageal reflux disease)   . Headache   . Arthritis     osteoarthritis  . Allergy   . Depression   . Neuromuscular disorder   . Osteoporosis   . Bronchitis     CURRENTLY AS OF 06/30/12 - HAS COUGH AND FINISHED ANTIBIOTIC FOR BRONCHITIS  . Fibromyalgia   . Pain     ABDOMINAL PAIN AND NAUSEA  . Pain     SOMETIMES PAIN RIGHT EAR AND NECK--STATES CAUSED BY A "LUMP" ON BACK OF EAR--USES KENALOG CREAM TOPICALLY AS NEEDED.    Past Surgical History  Procedure Laterality Date  . Abdominal hysterectomy    . Esophagogastroduodenoscopy  04/18/2012    Procedure: ESOPHAGOGASTRODUODENOSCOPY (EGD);  Surgeon: [name], MD;  Location: [facility];  Service: Endoscopy;  Laterality: N/A;  . Eus  05/29/2012    Procedure: UPPER ENDOSCOPIC ULTRASOUND (EUS) LINEAR;  Surgeon: [name], MD;  Location: [facility];  Service: Endoscopy;  Laterality: N/A;  . Appendectomy    . Spine surgery      CERVICAL SPINE SURGERY X 2 - INCLUDING FUSION; LUMBAR SURGERY FOR RUPTURED DISC  . Eye surgery      BILATERAL CATARACT EXTRACTIONS  . Laparoscopic abdominal exploration N/A 07/02/2012    Procedure: converted to laparotomy with gastric  biopsy;  Surgeon: [name]. [name], MD;  Location: [facility];  Service: General;  Laterality: N/A;  Laparoscopic Gastric Biospy attempted.   . Laparotomy N/A 07/07/2012    Procedure: EXPLORATORY LAPAROTOMY repair of gastric perforation with omental graham patch, drainage of abdominal abcess;  Surgeon: [name]. [name], MD;  Location: [facility];  Service: General;  Laterality: N/A;  . Stomach surgery  07/07/2012    Omental patch of gastric perforation  . Laparotomy N/A 07/13/2012    Procedure: EXPLORATORY LAPAROTOMY repair gastric leak;  Surgeon: Mariella Saa, MD;  Location: [facility];  Service: General;  Laterality: N/A;    Family History  Problem Relation Age of Onset  . COPD Mother   . Hypertension Maternal Grandmother     History  Substance Use Topics  . Smoking status: Former Smoker -- 0.50 packs/day for 35 years    Types: Cigarettes    Start date: 01/04/2003    Quit date: 04/17/2005  . Smokeless tobacco: Never Used  . Alcohol Use: No     Review of Systems ROS: Statement: All systems negative except as marked or noted in the HPI; Constitutional: Negative for fever and chills. ; ; Eyes: Negative for eye pain, redness and discharge. ; ; ENMT: Negative for ear pain, hoarseness, nasal congestion, sinus  pressure and sore throat. ; ; Cardiovascular: +chest pain. Negative for palpitations, diaphoresis, dyspnea and peripheral edema. ; ; Respiratory: Negative for cough, wheezing and stridor. ; ; Gastrointestinal: +abd pain. Negative for nausea, vomiting, diarrhea, blood in stool, hematemesis, jaundice and rectal bleeding. . ; ; Genitourinary: +dysuria. Negative for flank pain and hematuria. ; ; Musculoskeletal: Negative for back pain and neck pain. Negative for swelling and trauma.; ; Skin: Negative for pruritus, rash, abrasions, blisters, bruising and skin lesion.; ; Neuro: Negative for headache, lightheadedness and neck stiffness. Negative for weakness, altered level of consciousness , altered  mental status, extremity weakness, paresthesias, involuntary movement, seizure and syncope.       Allergies  Avelox; Alendronate sodium; Aspirin; Codeine; Doxycycline; Fluconazole; Hydrocodone; Neurontin; Nsaids; Sertraline hcl; and Sulfa antibiotics  Home Medications   Current Outpatient Rx  Name  Route  Sig  Dispense  Refill  . albuterol (PROVENTIL HFA;VENTOLIN HFA) 108 (90 BASE) MCG/ACT inhaler   Inhalation   Inhale 2 puffs into the lungs every 4 (four) hours as needed for wheezing (cough, shortness of breath or wheezing.).   1 Inhaler   1   . dextromethorphan-guaiFENesin (MUCINEX DM) 30-600 MG per 12 hr tablet   Oral   Take 1 tablet by mouth every 12 (twelve) hours.         . diphenhydrAMINE (BENADRYL) 25 mg capsule   Oral   Take 25-50 mg by mouth every 6 (six) hours as needed. Itching         . FLUoxetine (PROZAC) 20 MG capsule   Oral   Take 20 mg by mouth daily before breakfast.         . HYDROcodone-acetaminophen (HYCET) 7.5-325 mg/15 ml solution   Oral   Take 10 mLs by mouth every 4 (four) hours as needed.   120 mL   2   . Multiple Vitamin (MULTIVITAMIN) tablet   Oral   Take 1 tablet by mouth daily.         . ondansetron (ZOFRAN-ODT) 8 MG disintegrating tablet   Oral   Take 1 tablet (8 mg total) by mouth every 8 (eight) hours as needed. For nausea   20 tablet   2   . pantoprazole (PROTONIX) 40 MG tablet   Oral   Take 40 mg by mouth daily.         . promethazine (PHENERGAN) 12.5 MG tablet   Oral   Take 1 tablet (12.5 mg total) by mouth every 6 (six) hours as needed for nausea.   30 tablet   0   . ranitidine (ZANTAC) 150 MG tablet   Oral   Take 150 mg by mouth 2 (two) times daily.         Marland Kitchen EPINEPHrine (EPIPEN) 0.3 mg/0.3 mL DEVI   Intramuscular   Inject 0.3 mLs (0.3 mg total) into the muscle once.   1 Device   2   . fentaNYL (DURAGESIC - DOSED MCG/HR) 50 MCG/HR   Transdermal   Place 5 patches (250 mcg total) onto the skin every 3  (three) days.   5 patch   0   . triamcinolone cream (KENALOG) 0.1 %   Topical   Apply 1 application topically as needed (as needed for right ear pain).           BP 129/72  Pulse 108  Temp(Src) 98.5 F (36.9 C) (Oral)  Resp 18  SpO2 92%  Physical Exam 2145: Physical examination:  Nursing notes reviewed; Vital signs and  O2 SAT reviewed;  Constitutional: Thin, frail, Uncomfortable appearing.; Head:  Normocephalic, atraumatic; Eyes: EOMI, PERRL, No scleral icterus; ENMT: Mouth and pharynx normal, Mucous membranes dry; Neck: Supple, Full range of motion, No lymphadenopathy; Cardiovascular: Regular rate and rhythm, No gallop; Respiratory: Breath sounds clear & equal bilaterally, No rales, rhonchi, wheezes.  Speaking full sentences with ease, Normal respiratory effort/excursion; Chest: No deformity. +left lower lateral chest wall tender to palp. No rash, no soft tissue crepitus. Movement normal; Abdomen: Soft, +mid-epigastric and LUQ tenderness to palp. Nondistended, Normal bowel sounds. +surgical drain right abd.; Genitourinary: No CVA tenderness; Extremities: Pulses normal, +PICC line RUE. No tenderness, No edema, No calf edema or asymmetry.; Neuro: AA&Ox3, Major CN grossly intact.  Speech clear. No gross focal motor or sensory deficits in extremities.; Skin: Color normal, Warm, Dry.   ED Course  Procedures    MDM  MDM Reviewed: previous chart, nursing note and vitals Reviewed previous: ECG, labs, CT scan and x-ray Interpretation: ECG, labs and x-ray    Date: 08/04/2012  Rate: 109  Rhythm: sinus tachycardia  QRS Axis: normal  Intervals: normal  ST/T Wave abnormalities: normal  Conduction Disutrbances:none  Narrative Interpretation:   Old EKG Reviewed: unchanged; no significant changes from previous EKG dated 07/07/2012.  Results for orders placed during the hospital encounter of 08/04/12  CBC WITH DIFFERENTIAL      Result Value Range   WBC 28.8 (*) 4.0 - 10.5 K/uL   RBC  3.36 (*) 3.87 - 5.11 MIL/uL   Hemoglobin 9.2 (*) 12.0 - 15.0 g/dL   HCT 16.1 (*) 09.6 - 04.5 %   MCV 81.0  78.0 - 100.0 fL   MCH 27.4  26.0 - 34.0 pg   MCHC 33.8  30.0 - 36.0 g/dL   RDW 40.9  81.1 - 91.4 %   Platelets 478 (*) 150 - 400 K/uL   Neutrophils Relative PENDING  43 - 77 %   Neutro Abs PENDING  1.7 - 7.7 K/uL   Band Neutrophils PENDING  0 - 10 %   Lymphocytes Relative PENDING  12 - 46 %   Lymphs Abs PENDING  0.7 - 4.0 K/uL   Monocytes Relative PENDING  3 - 12 %   Monocytes Absolute PENDING  0.1 - 1.0 K/uL   Eosinophils Relative PENDING  0 - 5 %   Eosinophils Absolute PENDING  0.0 - 0.7 K/uL   Basophils Relative PENDING  0 - 1 %   Basophils Absolute PENDING  0.0 - 0.1 K/uL   WBC Morphology PENDING     RBC Morphology PENDING     Smear Review PENDING     nRBC PENDING  0 /100 WBC   Metamyelocytes Relative PENDING     Myelocytes PENDING     Promyelocytes Absolute PENDING     Blasts PENDING    COMPREHENSIVE METABOLIC PANEL      Result Value Range   Sodium 135  135 - 145 mEq/L   Potassium 3.8  3.5 - 5.1 mEq/L   Chloride 98  96 - 112 mEq/L   CO2 25  19 - 32 mEq/L   Glucose, Bld 86  70 - 99 mg/dL   BUN 21  6 - 23 mg/dL   Creatinine, Ser 7.82  0.50 - 1.10 mg/dL   Calcium 9.8  8.4 - 95.6 mg/dL   Total Protein 8.5 (*) 6.0 - 8.3 g/dL   Albumin 2.3 (*) 3.5 - 5.2 g/dL   AST 17  0 - 37 U/L  ALT 13  0 - 35 U/L   Alkaline Phosphatase 157 (*) 39 - 117 U/L   Total Bilirubin 0.5  0.3 - 1.2 mg/dL   GFR calc non Af Amer 86 (*) >90 mL/min   GFR calc Af Amer >90  >90 mL/min  LIPASE, BLOOD      Result Value Range   Lipase 18  11 - 59 U/L  LACTIC ACID, PLASMA      Result Value Range   Lactic Acid, Venous 1.4  0.5 - 2.2 mmol/L  PRO B NATRIURETIC PEPTIDE      Result Value Range   Pro B Natriuretic peptide (BNP) 160.9 (*) 0 - 125 pg/mL  TROPONIN I      Result Value Range   Troponin I <0.30  <0.30 ng/mL   Dg Chest Port 1 View 08/04/2012  *RADIOLOGY REPORT*  Clinical Data: Chest  pain and cough.  PORTABLE CHEST - 1 VIEW  Comparison: Chest CT 07/13/2012.  Findings: The cardiac silhouette, mediastinal and hilar contours are stable.  Persistent left lower lobe process appears to be a combination of a pleural effusion and atelectasis.  Minimal right basilar atelectasis also.  Slight increased interstitial markings could be due to bronchitis or interstitial pneumonitis.  The right PICC line tip is in the distal SVC.  The bony thorax is intact.  IMPRESSION: Persistent left lower lobe process. Possible bronchitis or interstitial pneumonitis.   Original Report Authenticated By: [name], M.D.    Results for Cynthia Bowen, Cynthia Bowen (MRN [id number]) as of 08/05/2012 00:25  Ref. Range 07/15/2012 23:40 07/18/2012 08:15 07/21/2012 04:15 07/23/2012 05:00 08/04/2012 23:20  WBC Latest Range: 4.6-10.2 K/uL 20.7 (H) 15.9 (H) 11.0 (H) 10.2 28.8 (H)  Hemoglobin Latest Range: 12.0-15.0 g/dL 9.8 (L) 9.4 (L) 9.2 (L) 9.0 (L) 9.2 (L)  HCT Latest Range: 36.0-46.0 % 28.7 (L) 26.7 (L) 27.0 (L) 26.1 (L) 27.2 (L)    0030:  WBC count significantly elevated from previous. UA/UC pending.  CT C/A/P pending. Will need admit. Sign out to Dr. Lynelle Doctor.             Laray Anger, DO 08/05/12 [phone]

## 2012-08-04 NOTE — ED Notes (Signed)
Main lab phlebotomist Tasha collected labs.

## 2012-08-04 NOTE — ED Notes (Signed)
Per EMS: Pt in from home c/o of L wall sharp constant chest pain that started yesterday morning. Balloon drain on R side. IVs in place R side for tube feedings. Chest pain accommpanied with headache. Denies n/v. Pt also reports pain during urination and foul smell but unsure of area of odor whether drain or urine.

## 2012-08-04 NOTE — ED Notes (Signed)
Lab attempt x2 unsuccessful. Main lab called and stated there would be a delay before they can come down. RN made aware

## 2012-08-05 ENCOUNTER — Encounter (HOSPITAL_COMMUNITY): Payer: Self-pay

## 2012-08-05 DIAGNOSIS — D72829 Elevated white blood cell count, unspecified: Secondary | ICD-10-CM

## 2012-08-05 DIAGNOSIS — J449 Chronic obstructive pulmonary disease, unspecified: Secondary | ICD-10-CM

## 2012-08-05 DIAGNOSIS — Z86711 Personal history of pulmonary embolism: Secondary | ICD-10-CM | POA: Diagnosis present

## 2012-08-05 DIAGNOSIS — I2699 Other pulmonary embolism without acute cor pulmonale: Principal | ICD-10-CM

## 2012-08-05 DIAGNOSIS — E43 Unspecified severe protein-calorie malnutrition: Secondary | ICD-10-CM

## 2012-08-05 DIAGNOSIS — R109 Unspecified abdominal pain: Secondary | ICD-10-CM

## 2012-08-05 LAB — URINALYSIS, ROUTINE W REFLEX MICROSCOPIC
Glucose, UA: NEGATIVE mg/dL
Ketones, ur: NEGATIVE mg/dL
Leukocytes, UA: NEGATIVE
Nitrite: NEGATIVE
Specific Gravity, Urine: 1.018 (ref 1.005–1.030)
pH: 5.5 (ref 5.0–8.0)

## 2012-08-05 LAB — CBC WITH DIFFERENTIAL/PLATELET
Basophils Absolute: 0 10*3/uL (ref 0.0–0.1)
Eosinophils Relative: 0 % (ref 0–5)
HCT: 27.2 % — ABNORMAL LOW (ref 36.0–46.0)
Lymphs Abs: 2 10*3/uL (ref 0.7–4.0)
MCH: 27.4 pg (ref 26.0–34.0)
MCV: 81 fL (ref 78.0–100.0)
Monocytes Absolute: 2 10*3/uL — ABNORMAL HIGH (ref 0.1–1.0)
Monocytes Relative: 7 % (ref 3–12)
Neutro Abs: 24.8 10*3/uL — ABNORMAL HIGH (ref 1.7–7.7)
Platelets: 478 10*3/uL — ABNORMAL HIGH (ref 150–400)
RDW: 15.5 % (ref 11.5–15.5)
WBC: 28.8 10*3/uL — ABNORMAL HIGH (ref 4.0–10.5)

## 2012-08-05 LAB — HEPATIC FUNCTION PANEL
Indirect Bilirubin: 0.4 mg/dL (ref 0.3–0.9)
Total Protein: 8 g/dL (ref 6.0–8.3)

## 2012-08-05 LAB — CBC
HCT: 25.5 % — ABNORMAL LOW (ref 36.0–46.0)
MCV: 79.4 fL (ref 78.0–100.0)
RBC: 3.21 MIL/uL — ABNORMAL LOW (ref 3.87–5.11)
WBC: 29 10*3/uL — ABNORMAL HIGH (ref 4.0–10.5)

## 2012-08-05 LAB — COMPREHENSIVE METABOLIC PANEL
BUN: 21 mg/dL (ref 6–23)
CO2: 25 mEq/L (ref 19–32)
Calcium: 9.8 mg/dL (ref 8.4–10.5)
Chloride: 98 mEq/L (ref 96–112)
Creatinine, Ser: 0.77 mg/dL (ref 0.50–1.10)
GFR calc non Af Amer: 86 mL/min — ABNORMAL LOW (ref >90)
Total Bilirubin: 0.5 mg/dL (ref 0.3–1.2)

## 2012-08-05 LAB — TRIGLYCERIDES: Triglycerides: 122 mg/dL (ref <150)

## 2012-08-05 LAB — GLUCOSE, CAPILLARY

## 2012-08-05 LAB — PROTIME-INR
INR: 1.28 (ref 0.00–1.49)
Prothrombin Time: 15.7 seconds — ABNORMAL HIGH (ref 11.6–15.2)

## 2012-08-05 LAB — LACTIC ACID, PLASMA: Lactic Acid, Venous: 1.4 mmol/L (ref 0.5–2.2)

## 2012-08-05 LAB — D-DIMER, QUANTITATIVE: D-Dimer, Quant: 4.02 ug/mL-FEU — ABNORMAL HIGH (ref 0.00–0.48)

## 2012-08-05 LAB — GRAM STAIN: Special Requests: NORMAL

## 2012-08-05 LAB — PREALBUMIN: Prealbumin: 6.9 mg/dL — ABNORMAL LOW (ref 17.0–34.0)

## 2012-08-05 LAB — BASIC METABOLIC PANEL
CO2: 25 mEq/L (ref 19–32)
Chloride: 98 mEq/L (ref 96–112)
Potassium: 3.7 mEq/L (ref 3.5–5.1)
Sodium: 134 mEq/L — ABNORMAL LOW (ref 135–145)

## 2012-08-05 LAB — TROPONIN I
Troponin I: <0.3 ng/mL (ref <0.30)
Troponin I: <0.3 ng/mL (ref <0.30)
Troponin I: <0.3 ng/mL (ref <0.30)

## 2012-08-05 LAB — LIPASE, BLOOD: Lipase: 18 U/L (ref 11–59)

## 2012-08-05 LAB — APTT: aPTT: 34 seconds (ref 24–37)

## 2012-08-05 LAB — PRO B NATRIURETIC PEPTIDE: Pro B Natriuretic peptide (BNP): 160.9 pg/mL — ABNORMAL HIGH (ref 0–125)

## 2012-08-05 MED ORDER — HEPARIN BOLUS VIA INFUSION
1250.0000 [IU] | Freq: Once | INTRAVENOUS | Status: AC
  Administered 2012-08-05: 1250 [IU] via INTRAVENOUS
  Filled 2012-08-05: qty 1250

## 2012-08-05 MED ORDER — HYDROCODONE-ACETAMINOPHEN 7.5-325 MG/15ML PO SOLN: 10.0000 mL | ORAL | Status: DC | PRN

## 2012-08-05 MED ORDER — ACETAMINOPHEN 650 MG RE SUPP
650.0000 mg | Freq: Four times a day (QID) | RECTAL | Status: DC | PRN
  Administered 2012-08-06: 650 mg via RECTAL
  Filled 2012-08-05: qty 1

## 2012-08-05 MED ORDER — SODIUM CHLORIDE 0.9 % IV SOLN
100.0000 mg | Freq: Every day | INTRAVENOUS | Status: DC
  Administered 2012-08-05 – 2012-08-11 (×7): 100 mg via INTRAVENOUS
  Filled 2012-08-05 (×10): qty 100

## 2012-08-05 MED ORDER — HEPARIN (PORCINE) IN NACL 100-0.45 UNIT/ML-% IJ SOLN
1850.0000 [IU]/h | INTRAMUSCULAR | Status: DC
  Administered 2012-08-05: 1200 [IU]/h via INTRAVENOUS
  Administered 2012-08-06: 1550 [IU]/h via INTRAVENOUS
  Administered 2012-08-06: 1200 [IU]/h via INTRAVENOUS
  Administered 2012-08-07 – 2012-08-08 (×2): 1650 [IU]/h via INTRAVENOUS
  Filled 2012-08-05 (×5): qty 250

## 2012-08-05 MED ORDER — HEPARIN (PORCINE) IN NACL 100-0.45 UNIT/ML-% IJ SOLN
900.0000 [IU]/h | INTRAMUSCULAR | Status: DC
  Filled 2012-08-05 (×2): qty 250

## 2012-08-05 MED ORDER — SODIUM CHLORIDE 0.9 % IJ SOLN
3.0000 mL | Freq: Two times a day (BID) | INTRAMUSCULAR | Status: DC
  Administered 2012-08-06: 3 mL via INTRAVENOUS
  Administered 2012-08-07: 10 mL via INTRAVENOUS
  Administered 2012-08-08 – 2012-08-13 (×4): 3 mL via INTRAVENOUS

## 2012-08-05 MED ORDER — ALBUTEROL SULFATE HFA 108 (90 BASE) MCG/ACT IN AERS: 2.0000 | INHALATION_SPRAY | RESPIRATORY_TRACT | Status: DC | PRN

## 2012-08-05 MED ORDER — IOHEXOL 350 MG/ML SOLN
80.0000 mL | Freq: Once | INTRAVENOUS | Status: AC | PRN
  Administered 2012-08-05: 80 mL via INTRAVENOUS

## 2012-08-05 MED ORDER — DIPHENHYDRAMINE HCL 25 MG PO CAPS: 25.0000 mg | ORAL_CAPSULE | Freq: Four times a day (QID) | ORAL | Status: DC | PRN

## 2012-08-05 MED ORDER — FAMOTIDINE 20 MG PO TABS
20.0000 mg | ORAL_TABLET | Freq: Two times a day (BID) | ORAL | Status: DC
  Administered 2012-08-05: 20 mg via ORAL
  Filled 2012-08-05 (×2): qty 1

## 2012-08-05 MED ORDER — PIPERACILLIN-TAZOBACTAM 3.375 G IVPB
3.3750 g | Freq: Three times a day (TID) | INTRAVENOUS | Status: DC
  Administered 2012-08-05 – 2012-08-12 (×22): 3.375 g via INTRAVENOUS
  Filled 2012-08-05 (×24): qty 50

## 2012-08-05 MED ORDER — ACETAMINOPHEN 325 MG PO TABS
650.0000 mg | ORAL_TABLET | Freq: Four times a day (QID) | ORAL | Status: DC | PRN
  Administered 2012-08-05 – 2012-08-11 (×3): 650 mg via ORAL
  Filled 2012-08-05 (×3): qty 2

## 2012-08-05 MED ORDER — HEPARIN BOLUS VIA INFUSION
2500.0000 [IU] | Freq: Once | INTRAVENOUS | Status: AC
  Administered 2012-08-05: 2500 [IU] via INTRAVENOUS

## 2012-08-05 MED ORDER — CLINIMIX E/DEXTROSE (5/15) 5 % IV SOLN
INTRAVENOUS | Status: AC
  Administered 2012-08-05: 18:00:00 via INTRAVENOUS
  Filled 2012-08-05: qty 2000

## 2012-08-05 MED ORDER — PANTOPRAZOLE SODIUM 40 MG PO TBEC
40.0000 mg | DELAYED_RELEASE_TABLET | Freq: Every day | ORAL | Status: DC
  Administered 2012-08-05: 40 mg via ORAL
  Filled 2012-08-05: qty 1

## 2012-08-05 MED ORDER — INSULIN ASPART 100 UNIT/ML ~~LOC~~ SOLN
0.0000 [IU] | Freq: Four times a day (QID) | SUBCUTANEOUS | Status: DC
  Administered 2012-08-05 – 2012-08-09 (×5): 1 [IU] via SUBCUTANEOUS

## 2012-08-05 MED ORDER — ONDANSETRON HCL 4 MG PO TABS: 4.0000 mg | ORAL_TABLET | Freq: Four times a day (QID) | ORAL | Status: DC | PRN

## 2012-08-05 MED ORDER — MORPHINE SULFATE 2 MG/ML IJ SOLN
2.0000 mg | INTRAMUSCULAR | Status: DC | PRN
  Administered 2012-08-05 – 2012-08-07 (×8): 2 mg via INTRAVENOUS
  Filled 2012-08-05 (×8): qty 1

## 2012-08-05 MED ORDER — SODIUM CHLORIDE 0.9 % IV SOLN
INTRAVENOUS | Status: DC
  Administered 2012-08-07: 04:00:00 via INTRAVENOUS

## 2012-08-05 MED ORDER — CHLORHEXIDINE GLUCONATE 0.12 % MT SOLN
15.0000 mL | Freq: Two times a day (BID) | OROMUCOSAL | Status: DC
  Administered 2012-08-05 – 2012-08-14 (×15): 15 mL via OROMUCOSAL
  Filled 2012-08-05 (×19): qty 15

## 2012-08-05 MED ORDER — FENTANYL 50 MCG/HR TD PT72
250.0000 ug | MEDICATED_PATCH | TRANSDERMAL | Status: DC
  Administered 2012-08-05: 250 ug via TRANSDERMAL
  Filled 2012-08-05: qty 2

## 2012-08-05 MED ORDER — ONDANSETRON HCL 4 MG/2ML IJ SOLN
4.0000 mg | Freq: Four times a day (QID) | INTRAMUSCULAR | Status: DC | PRN
  Administered 2012-08-09 – 2012-08-14 (×6): 4 mg via INTRAVENOUS
  Filled 2012-08-05 (×6): qty 2

## 2012-08-05 MED ORDER — PANTOPRAZOLE SODIUM 40 MG IV SOLR
40.0000 mg | INTRAVENOUS | Status: DC
  Administered 2012-08-05 – 2012-08-07 (×3): 40 mg via INTRAVENOUS
  Filled 2012-08-05 (×4): qty 40

## 2012-08-05 MED ORDER — HEPARIN (PORCINE) IN NACL 100-0.45 UNIT/ML-% IJ SOLN
750.0000 [IU]/h | INTRAMUSCULAR | Status: DC
  Administered 2012-08-05: 750 [IU]/h via INTRAVENOUS
  Filled 2012-08-05: qty 250

## 2012-08-05 MED ORDER — FLUOXETINE HCL 20 MG PO CAPS
20.0000 mg | ORAL_CAPSULE | Freq: Every day | ORAL | Status: DC
  Administered 2012-08-05: 20 mg via ORAL
  Filled 2012-08-05 (×2): qty 1

## 2012-08-05 NOTE — Progress Notes (Signed)
ANTICOAGULATION CONSULT NOTE - Follow-up Consult  Pharmacy Consult for Heparin Indication: pulmonary embolus  Allergies  Allergen Reactions  . Avelox (Moxifloxacin Hcl In Nacl) Nausea And Vomiting  . Alendronate Sodium Nausea And Vomiting  . Aspirin Nausea Only  . Codeine Nausea And Vomiting  . Doxycycline   . Fluconazole   . Hydrocodone Nausea And Vomiting    GI distress  . Neurontin (Gabapentin) Other (See Comments)    Mood changes   . Nsaids Other (See Comments)    Severe gastritis & perforation - avoid NSAIDs when possible  . Sertraline Hcl Other (See Comments)    Hallucinations   . Sulfa Antibiotics Rash    Patient Measurements: Height: 5\' 5"  (165.1 cm) Weight: 94 lb 12.8 oz (43 kg) IBW/kg (Calculated) : 57   Vital Signs: Temp: 98.4 F (36.9 C) (04/15 2000) Temp src: Oral (04/15 2000) BP: 88/52 mmHg (04/15 2000) Pulse Rate: 73 (04/15 1919)  Labs:  Recent Labs  08/04/12 2320 08/05/12 0318 08/05/12 0447 08/05/12 1025 08/05/12 1205 08/05/12 1556 08/05/12 2020  HGB 9.2*  --  8.8*  --   --   --   --   HCT 27.2*  --  25.5*  --   --   --   --   PLT 478*  --  503*  --   --   --   --   APTT  --  34  --   --   --   --   --   LABPROT  --  15.7*  --   --   --   --   --   INR  --  1.28  --   --   --   --   --   HEPARINUNFRC  --   --   --   --  <0.10*  --  <0.10*  CREATININE 0.77  --  0.62  --   --   --   --   TROPONINI <0.30  --  <0.30 <0.30  --  <0.30  --     Estimated Creatinine Clearance: 47.6 ml/min (by C-G formula based on Cr of 0.62).   Assessment: 67 yo c/o chest and abdominal pain- found to have large PE in pulmonary artery to the LLL. Heparin started 4/15am. Baseline INR = 1.28  Heparin level undetectable x 2.  Patient is currently receiving IV heparin at 900 units/hr (rated verified with RN)  CBC: hgb=8.8, plts=503  No bleeding noted per RN  Goal of Therapy:  Heparin level 0.3-0.7 units/ml Monitor platelets by anticoagulation protocol: Yes   Plan:   Heparin 1250 unit bolus then increase heparin gtt rate to 1200 units/hr per [place] monogram  Recheck heparin level with AM labs (0500)  Daily HL/CBC    Geoffry Paradise, PharmD, BCPS Pager: [phone] 9:24 PM Pharmacy #: [phone]

## 2012-08-05 NOTE — ED Notes (Signed)
Lab at bedside to draw labs.

## 2012-08-05 NOTE — Progress Notes (Signed)
INITIAL NUTRITION ASSESSMENT  DOCUMENTATION CODES Per approved criteria  -Severe malnutrition in the context of chronic illness  Pt meets criteria for SEVERE MALNUTRITION in the context of chronic illness as evidenced by no PO intake for > 1 month and signs of severe muscle and fat wasting found in nutrition-focused physical exam.   INTERVENTION: TPN per PharmD  NUTRITION DIAGNOSIS: Inadequate oral intake related to inability to eat as evidenced by NPO status.   Goal: Pt to meet >/= 90% of their estimated nutrition needs  Monitor:  TPN initiation and rate Wt Labs  Reason for Assessment: Consult for new TNA/TPN  67 y.o. female  Admitting Dx: PE (pulmonary embolism)  ASSESSMENT: 67 y.o. female who was recently admitted for gastric perforation and peritonitis presents with complaints of chest pain and abdominal pain. Pt states that she hasn't had anything to eat for over a month except for ice chips because she can't keep anything down. Pt states she does not have any trouble chewing or swallowing food. Usual body weight is 115 lbs.  Height: Ht Readings from Last 1 Encounters:  08/05/12 5\' 5"  (1.651 m)    Weight: Wt Readings from Last 1 Encounters:  08/05/12 94 lb 12.8 oz (43 kg)    Ideal Body Weight: 125 lbs  % Ideal Body Weight: 75%  Wt Readings from Last 10 Encounters:  08/05/12 94 lb 12.8 oz (43 kg)  07/14/12 97 lb 10.6 oz (44.3 kg)  07/14/12 97 lb 10.6 oz (44.3 kg)  07/14/12 97 lb 10.6 oz (44.3 kg)  07/02/12 96 lb 5 oz (43.687 kg)  07/02/12 96 lb 5 oz (43.687 kg)  07/02/12 96 lb 5 oz (43.687 kg)  06/30/12 96 lb 3.2 oz (43.636 kg)  06/30/12 98 lb 12.8 oz (44.815 kg)  06/24/12 98 lb 3.2 oz (44.543 kg)    Usual Body Weight: 115 lbs   % Usual Body Weight: 82%  BMI:  Body mass index is 15.78 kg/(m^2).  Estimated Nutritional Needs: Kcal:  4098-1191 Protein: 65-77 grams  Fluid: 1.8 L  Nutrition Focused Physical Exam:  Subcutaneous Fat:  Orbital  Region: moderate wasting Upper Arm Region: severe wasting Thoracic and Lumbar Region: moderate wasting  Muscle:  Temple Region: moderate wasting Clavicle Bone Region: severe wasting Clavicle and Acromion Bone Region: severe wasting Scapular Bone Region: NA Dorsal Hand: moderate wasting Patellar Region: severe wasting Anterior Thigh Region: severe wasting Posterior Calf Region: severe wasting  Edema: not present  Skin: abdominal incision with dressing  Diet Order: NPO  EDUCATION NEEDS: -No education needs identified at this time   Intake/Output Summary (Last 24 hours) at 08/05/12 1008 Last data filed at 08/05/12 0700  Gross per 24 hour  Intake    300 ml  Output    100 ml  Net    200 ml    Last BM: 4/3 (constipation)  Labs:   Recent Labs Lab 08/04/12 2320 08/05/12 0447  NA 135 134*  K 3.8 3.7  CL 98 98  CO2 25 25  BUN 21 18  CREATININE 0.77 0.62  CALCIUM 9.8 9.6  MG  --  1.7  PHOS  --  3.1  GLUCOSE 86 95    CBG (last 3)  No results found for this basename: GLUCAP,  in the last 72 hours  Scheduled Meds: . famotidine  20 mg Oral BID  . fentaNYL  250 mcg Transdermal Q72H  . FLUoxetine  20 mg Oral QAC breakfast  . pantoprazole  40 mg Oral Daily  .  piperacillin-tazobactam (ZOSYN)  IV  3.375 g Intravenous Q8H  . sodium chloride  3 mL Intravenous Q12H    Continuous Infusions: . sodium chloride 75 mL/hr at 08/05/12 0800  . heparin 750 Units/hr (08/05/12 0800)    Past Medical History  Diagnosis Date  . COPD (chronic obstructive pulmonary disease)   . Pneumonia 12-2011  . GERD (gastroesophageal reflux disease)   . Headache   . Arthritis     osteoarthritis  . Allergy   . Depression   . Neuromuscular disorder   . Osteoporosis   . Bronchitis     CURRENTLY AS OF 06/30/12 - HAS COUGH AND FINISHED ANTIBIOTIC FOR BRONCHITIS  . Fibromyalgia   . Pain     ABDOMINAL PAIN AND NAUSEA  . Pain     SOMETIMES PAIN RIGHT EAR AND NECK--STATES CAUSED BY A "LUMP"  ON BACK OF EAR--USES KENALOG CREAM TOPICALLY AS NEEDED.    Past Surgical History  Procedure Laterality Date  . Abdominal hysterectomy    . Esophagogastroduodenoscopy  04/18/2012    Procedure: ESOPHAGOGASTRODUODENOSCOPY (EGD);  Surgeon: [name], MD;  Location: [facility];  Service: Endoscopy;  Laterality: N/A;  . Eus  05/29/2012    Procedure: UPPER ENDOSCOPIC ULTRASOUND (EUS) LINEAR;  Surgeon: [name], MD;  Location: [facility];  Service: Endoscopy;  Laterality: N/A;  . Appendectomy    . Spine surgery      CERVICAL SPINE SURGERY X 2 - INCLUDING FUSION; LUMBAR SURGERY FOR RUPTURED DISC  . Eye surgery      BILATERAL CATARACT EXTRACTIONS  . Laparoscopic abdominal exploration N/A 07/02/2012    Procedure: converted to laparotomy with gastric biopsy;  Surgeon: [name]. [name], MD;  Location: [facility];  Service: General;  Laterality: N/A;  Laparoscopic Gastric Biospy attempted.   . Laparotomy N/A 07/07/2012    Procedure: EXPLORATORY LAPAROTOMY repair of gastric perforation with omental graham patch, drainage of abdominal abcess;  Surgeon: [name]. [name], MD;  Location: [facility];  Service: General;  Laterality: N/A;  . Stomach surgery  07/07/2012    Omental patch of gastric perforation  . Laparotomy N/A 07/13/2012    Procedure: EXPLORATORY LAPAROTOMY repair gastric leak;  Surgeon: Mariella Saa, MD;  Location: [facility];  Service: General;  Laterality: N/A;    Ian Malkin RD, LDN Inpatient Clinical Dietitian Pager: [phone] After Hours Pager: [phone]

## 2012-08-05 NOTE — Progress Notes (Signed)
[facility]  Patient Status: Active with [facility] up to the time of this admissions.  [facility] is providing the following services: Home Health Nursing, PT and Infusion Pharmacy services for home TPN.  [facility] Coordinator will follow while inpatient and assist for transition back home when deemed appropriate by physician team.  If patient discharges after hours, please call [phone].   Sedalia Muta 08/05/2012, 11:41 AM

## 2012-08-05 NOTE — H&P (Signed)
[facility] History and Physical  Cynthia Bowen:[id number] DOB: 02/03/1946 DOA: 08/04/2012  Referring physician: Patsy Lager PCP: Tonye Pearson, MD  Specialists: Dr.Tsuie Surgeon.   Chief Complaint: Chest pain and abdominal pain.  HPI: Cynthia Bowen is a 67 y.o. female who was recently admitted for gastric perforation and peritonitis presents with complaints of chest pain and abdominal pain. Patient states that yesterday morning around 3 AM patient was worked up by sharp abdominal pain evidently started developing left-sided chest pain with shortness of breath. Denies any associated nausea vomiting or diarrhea or any fever or chills. Patient's pain was persistent and came to the ER. Labs show increased WBC count. CT chest shows large left-sided pulmonary embolism and CT abdomen shows possibility of the recurrence of leak. Patient has been admitted for further management.  Review of Systems: As presented in the history of presenting illness, rest negative.  Past Medical History  Diagnosis Date  . COPD (chronic obstructive pulmonary disease)   . Pneumonia 12-2011  . GERD (gastroesophageal reflux disease)   . Headache   . Arthritis     osteoarthritis  . Allergy   . Depression   . Neuromuscular disorder   . Osteoporosis   . Bronchitis     CURRENTLY AS OF 06/30/12 - HAS COUGH AND FINISHED ANTIBIOTIC FOR BRONCHITIS  . Fibromyalgia   . Pain     ABDOMINAL PAIN AND NAUSEA  . Pain     SOMETIMES PAIN RIGHT EAR AND NECK--STATES CAUSED BY A "LUMP" ON BACK OF EAR--USES KENALOG CREAM TOPICALLY AS NEEDED.   Past Surgical History  Procedure Laterality Date  . Abdominal hysterectomy    . Esophagogastroduodenoscopy  04/18/2012    Procedure: ESOPHAGOGASTRODUODENOSCOPY (EGD);  Surgeon: [name], MD;  Location: [facility];  Service: Endoscopy;  Laterality: N/A;  . Eus  05/29/2012    Procedure: UPPER ENDOSCOPIC ULTRASOUND (EUS) LINEAR;  Surgeon: [name], MD;  Location:  [facility];  Service: Endoscopy;  Laterality: N/A;  . Appendectomy    . Spine surgery      CERVICAL SPINE SURGERY X 2 - INCLUDING FUSION; LUMBAR SURGERY FOR RUPTURED DISC  . Eye surgery      BILATERAL CATARACT EXTRACTIONS  . Laparoscopic abdominal exploration N/A 07/02/2012    Procedure: converted to laparotomy with gastric biopsy;  Surgeon: [name]. [name], MD;  Location: [facility];  Service: General;  Laterality: N/A;  Laparoscopic Gastric Biospy attempted.   . Laparotomy N/A 07/07/2012    Procedure: EXPLORATORY LAPAROTOMY repair of gastric perforation with omental graham patch, drainage of abdominal abcess;  Surgeon: [name]. [name], MD;  Location: [facility];  Service: General;  Laterality: N/A;  . Stomach surgery  07/07/2012    Omental patch of gastric perforation  . Laparotomy N/A 07/13/2012    Procedure: EXPLORATORY LAPAROTOMY repair gastric leak;  Surgeon: Mariella Saa, MD;  Location: [facility];  Service: General;  Laterality: N/A;   Social History:  reports that she quit smoking about 7 years ago. Her smoking use included Cigarettes. She started smoking about 9 years ago. She has a 17.5 pack-year smoking history. She has never used smokeless tobacco. She reports that she does not drink alcohol or use illicit drugs. Lives at home. where does patient live-- Not sure. Can patient participate in ADLs?  Allergies  Allergen Reactions  . Avelox (Moxifloxacin Hcl In Nacl) Nausea And Vomiting  . Alendronate Sodium Nausea And Vomiting  . Aspirin Nausea Only  . Codeine Nausea And  Vomiting  . Doxycycline   . Fluconazole   . Hydrocodone Nausea And Vomiting    GI distress  . Neurontin (Gabapentin) Other (See Comments)    Mood changes   . Nsaids Other (See Comments)    Severe gastritis & perforation - avoid NSAIDs when possible  . Sertraline Hcl Other (See Comments)    Hallucinations   . Sulfa Antibiotics Rash    Family History  Problem Relation Age of Onset  . COPD Mother   .  Hypertension Maternal Grandmother       Prior to Admission medications   Medication Sig Start Date End Date Taking? Authorizing Provider  albuterol (PROVENTIL HFA;VENTOLIN HFA) 108 (90 BASE) MCG/ACT inhaler Inhale 2 puffs into the lungs every 4 (four) hours as needed for wheezing (cough, shortness of breath or wheezing.). 06/30/12  Yes Phillips Odor, MD  dextromethorphan-guaiFENesin [facility] DM) 30-600 MG per 12 hr tablet Take 1 tablet by mouth every 12 (twelve) hours.   Yes Historical Provider, MD  diphenhydrAMINE (BENADRYL) 25 mg capsule Take 25-50 mg by mouth every 6 (six) hours as needed. Itching   Yes Historical Provider, MD  FLUoxetine (PROZAC) 20 MG capsule Take 20 mg by mouth daily before breakfast.   Yes Historical Provider, MD  HYDROcodone-acetaminophen (HYCET) 7.5-325 mg/15 ml solution Take 10 mLs by mouth every 4 (four) hours as needed. 07/26/12  Yes Velora Heckler, MD  Multiple Vitamin (MULTIVITAMIN) tablet Take 1 tablet by mouth daily.   Yes Historical Provider, MD  ondansetron (ZOFRAN-ODT) 8 MG disintegrating tablet Take 1 tablet (8 mg total) by mouth every 8 (eight) hours as needed. For nausea 05/23/12  Yes Tonye Pearson, MD  pantoprazole (PROTONIX) 40 MG tablet Take 40 mg by mouth daily.   Yes Historical Provider, MD  promethazine (PHENERGAN) 12.5 MG tablet Take 1 tablet (12.5 mg total) by mouth every 6 (six) hours as needed for nausea. 07/04/12  Yes [name]. Tsuei, MD  ranitidine (ZANTAC) 150 MG tablet Take 150 mg by mouth 2 (two) times daily.   Yes Historical Provider, MD  EPINEPHrine (EPIPEN) 0.3 mg/0.3 mL DEVI Inject 0.3 mLs (0.3 mg total) into the muscle once. 04/22/12   Gwenlyn Found Copland, MD  fentaNYL (DURAGESIC - DOSED MCG/HR) 50 MCG/HR Place 5 patches (250 mcg total) onto the skin every 3 (three) days. 07/26/12   Velora Heckler, MD  triamcinolone cream (KENALOG) 0.1 % Apply 1 application topically as needed (as needed for right ear pain). 05/05/12   Pearline Cables, MD    Physical Exam: Filed Vitals:   08/04/12 2332 08/05/12 0000 08/05/12 0238 08/05/12 0300  BP: 129/72 119/71 124/70 122/74  Pulse:  101  99  Temp: 98.5 F (36.9 C)  98.7 F (37.1 C)   TempSrc: Oral  Oral   Resp: 18 19 24 19   Height:   5\' 5"  (1.651 m)   Weight:   40.824 kg (90 lb)   SpO2: 92% 89% 97% 96%     General:  Well-developed poorly nourished.  Eyes: Anicteric no pallor.  ENT: No discharge from ears eyes nose and mouth.  Neck: No mass felt.  Cardiovascular: S1-S2 heard.  Respiratory: No rhonchi or crepitations.  Abdomen: Soft with mild tenderness in the epigastric area. Bowel sounds present. Dressing seen.  Skin: No rash.  Musculoskeletal: No edema.  Psychiatric: Appears normal.  Neurologic: Moves all extremities.  Labs on Admission:  Basic Metabolic Panel:  Recent Labs Lab 08/04/12 2320  NA 135  K  3.8  CL 98  CO2 25  GLUCOSE 86  BUN 21  CREATININE 0.77  CALCIUM 9.8   Liver Function Tests:  Recent Labs Lab 08/04/12 2320  AST 17  ALT 13  ALKPHOS 157*  BILITOT 0.5  PROT 8.5*  ALBUMIN 2.3*    Recent Labs Lab 08/04/12 2320  LIPASE 18   No results found for this basename: AMMONIA,  in the last 168 hours CBC:  Recent Labs Lab 08/04/12 2320  WBC 28.8*  NEUTROABS 24.8*  HGB 9.2*  HCT 27.2*  MCV 81.0  PLT 478*   Cardiac Enzymes:  Recent Labs Lab 08/04/12 2320  TROPONINI <0.30    BNP (last 3 results)  Recent Labs  08/04/12 2320  PROBNP 160.9*   CBG: No results found for this basename: GLUCAP,  in the last 168 hours  Radiological Exams on Admission: Ct Angio Chest Pe W/cm &/or Wo Cm  08/05/2012  *RADIOLOGY REPORT*  Clinical Data: Shortness of breath.  Sharp left chest pain.  CT ANGIOGRAPHY CHEST  Technique:  Multidetector CT imaging of the chest using the standard protocol during bolus administration of intravenous contrast. Multiplanar reconstructed images including MIPs were obtained and reviewed to evaluate the  vascular anatomy.  Contrast: 80mL OMNIPAQUE IOHEXOL 350 MG/ML SOLN  Comparison: CT scan of the chest dated 04/10/2012 and CT scan of the abdomen dated 07/13/2012  Findings: There is an extensive large pulmonary embolus in the pulmonary artery to the left lower lobe.  There is secondary extensive left lower lobe consolidation and interstitial accentuation which may represent pulmonary infarction. Tiny emboli in the proximal portion of the pulmonary artery to the left upper lobe.  This is superimposed on diffuse emphysema.  Heart size is normal. No hilar mediastinal adenopathy.  The right lung is clear except for some slight atelectasis at the right base.  There is a small chronic pericardial effusion.  No acute osseous abnormality.  The thoracic aorta is normal except for slight calcification. Origins of the brachiocephalic vessels are widely patent.  IMPRESSION:  1.  Large extensive pulmonary embolus in the left lower lobe pulmonary artery.  Smaller emboli in the left upper lobe pulmonary artery. 2.  Possible infarction in the left lower lobe. 3.  Diffuse emphysema.   Original Report Authenticated By: Francene Boyers, M.D.    Ct Abdomen Pelvis W Contrast  08/05/2012  *RADIOLOGY REPORT*  Clinical Data: Abdominal pain.  Recent gastric perforation.  CT ABDOMEN AND PELVIS WITH CONTRAST  Technique:  Multidetector CT imaging of the abdomen and pelvis was performed following the standard protocol during bolus administration of intravenous contrast.  Contrast: 80mL OMNIPAQUE IOHEXOL 350 MG/ML SOLN  Comparison: CT scan dated 07/13/2012  Findings: Drain is present in the upper abdomen anterior to the antrum of the stomach.  There are a few small extraluminal air fluid collections at that site, slightly diminished. There is an area of what is probably residual contrast and a there are fluid collection posterior to the left lobe of the liver seen on image number 26 of series 7.  Thickening of the mucosa of the distal stomach  is unchanged. There are a few extraluminal air bubbles anterior to the distal antrum.  I cannot exclude a recurrent perforation based on these images.  Liver, spleen, pancreas, adrenal glands, and kidneys appear stable. Bilateral renal cysts and right renal calculi are unchanged.  No dilated large or small bowel.  Stable 2.5 cm cyst on the right ovary.  IMPRESSION: Interval slight decrease  in the fluid and air collections anterior to the stomach adjacent to the soft tissue drain. Persistent or recurrent contrast in the fluid collections anterior to the stomach.  This could represent a recurrent leak.  No other change.   Original Report Authenticated By: Francene Boyers, M.D.    Dg Chest Port 1 View  08/04/2012  *RADIOLOGY REPORT*  Clinical Data: Chest pain and cough.  PORTABLE CHEST - 1 VIEW  Comparison: Chest CT 07/13/2012.  Findings: The cardiac silhouette, mediastinal and hilar contours are stable.  Persistent left lower lobe process appears to be a combination of a pleural effusion and atelectasis.  Minimal right basilar atelectasis also.  Slight increased interstitial markings could be due to bronchitis or interstitial pneumonitis.  The right PICC line tip is in the distal SVC.  The bony thorax is intact.  IMPRESSION: Persistent left lower lobe process. Possible bronchitis or interstitial pneumonitis.   Original Report Authenticated By: [name], M.D.      Assessment/Plan Principal Problem:   PE (pulmonary embolism) Active Problems:   Protein-calorie malnutrition, severe   Abdominal pain   1. Pulmonary embolism - patient has been started on IV heparin which will be continued for now. If no further surgical procedures planned and may change to oral anticoagulants. Check Dopplers of the upper and lower extremities.  2. Abdominal pain with recent admission for gastric perforation and peritonitis - CT shows possibility of recurrence of leak. Patient will be kept n.p.o. and started on Zosyn for  now. Follow cultures. Surgery consult. 3. Leukocytosis - check blood cultures. Patient has been placed on Zosyn for possible intra-abdominal infection from recurrence of leak. 4. COPD - presently not wheezing. Continue inhalers. 5. Protein calorie malnutrition - patient is on TPN. I have advised pharmacy to dose.    Code Status: Full code.  Family Communication: None.  Disposition Plan: Admit to inpatient.    Lena Gores N. [facility] Pager [phone].  If 7PM-7AM, please contact night-coverage [email or web address] Password [facility] [place] 08/05/2012, 3:26 AM

## 2012-08-05 NOTE — Consult Note (Signed)
Chief Complaint:  Recurrent upper abdominal pain (last surgery 3 weeks ago)  History of Present Illness:  Cynthia Bowen is an 67 y.o. 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  Drain placed.  Pain onset was worst yesterday.    Past Medical History  Diagnosis Date  . COPD (chronic obstructive pulmonary disease)   . Pneumonia 12-2011  . GERD (gastroesophageal reflux disease)   . Headache   . Arthritis     osteoarthritis  . Allergy   . Depression   . Neuromuscular disorder   . Osteoporosis   . Bronchitis     CURRENTLY AS OF 06/30/12 - HAS COUGH AND FINISHED ANTIBIOTIC FOR BRONCHITIS  . Fibromyalgia   . Pain     ABDOMINAL PAIN AND NAUSEA  . Pain     SOMETIMES PAIN RIGHT EAR AND NECK--STATES CAUSED BY A "LUMP" ON BACK OF EAR--USES KENALOG CREAM TOPICALLY AS NEEDED.    Past Surgical History  Procedure Laterality Date  . Abdominal hysterectomy    . Esophagogastroduodenoscopy  04/18/2012    Procedure: ESOPHAGOGASTRODUODENOSCOPY (EGD);  Surgeon: [name], MD;  Location: [facility];  Service: Endoscopy;  Laterality: N/A;  . Eus  05/29/2012    Procedure: UPPER ENDOSCOPIC ULTRASOUND (EUS) LINEAR;  Surgeon: [name], MD;  Location: [facility];  Service: Endoscopy;  Laterality: N/A;  . Appendectomy    . Spine surgery      CERVICAL SPINE SURGERY X 2 - INCLUDING FUSION; LUMBAR SURGERY FOR RUPTURED DISC  . Eye surgery      BILATERAL CATARACT EXTRACTIONS  . Laparoscopic abdominal exploration N/A 07/02/2012    Procedure: converted to laparotomy with gastric biopsy;  Surgeon: [name]. [name], MD;  Location: [facility];  Service: General;  Laterality: N/A;  Laparoscopic Gastric Biospy attempted.   . Laparotomy N/A 07/07/2012    Procedure: EXPLORATORY LAPAROTOMY repair of gastric perforation with omental graham patch, drainage of abdominal abcess;  Surgeon: [name]. [name], MD;  Location: [facility];  Service: General;   Laterality: N/A;  . Stomach surgery  07/07/2012    Omental patch of gastric perforation  . Laparotomy N/A 07/13/2012    Procedure: EXPLORATORY LAPAROTOMY repair gastric leak;  Surgeon: Mariella Saa, MD;  Location: [facility];  Service: General;  Laterality: N/A;    Current Facility-Administered Medications  Medication Dose Route Frequency Provider Last Rate Last Dose  . Marland KitchenTPN (CLINIMIX-E) Adult   Intravenous Cyclic-See Admin Instructions Dannielle Huh, RPH      . 0.9 %  sodium chloride infusion   Intravenous Continuous Dannielle Huh, RPH 75 mL/hr at 08/05/12 0800    . 0.9 %  sodium chloride infusion   Intravenous Continuous Dannielle Huh, RPH      . acetaminophen (TYLENOL) tablet 650 mg  650 mg Oral Q6H PRN Eduard Clos, MD       Or  . acetaminophen (TYLENOL) suppository 650 mg  650 mg Rectal Q6H PRN Eduard Clos, MD      . albuterol (PROVENTIL HFA;VENTOLIN HFA) 108 (90 BASE) MCG/ACT inhaler 2 puff  2 puff Inhalation Q4H PRN Eduard Clos, MD      . diphenhydrAMINE (BENADRYL) capsule 25-50 mg  25-50 mg Oral Q6H PRN Eduard Clos, MD      . fentaNYL (DURAGESIC - dosed mcg/hr) 250 mcg  250 mcg Transdermal Q72H Eduard Clos, MD   250 mcg at 08/05/12 1023  .  heparin ADULT infusion 100 units/mL (25000 units/250 mL)  750 Units/hr Intravenous Continuous Lorenza Evangelist, RPH 7.5 mL/hr at 08/05/12 0800 750 Units/hr at 08/05/12 0800  . insulin aspart (novoLOG) injection 0-9 Units  0-9 Units Subcutaneous Q6H Dannielle Huh, RPH      . micafungin [facility]) 100 mg in sodium chloride 0.9 % 100 mL IVPB  100 mg Intravenous Daily Leroy Sea, MD   100 mg at 08/05/12 1219  . morphine 2 MG/ML injection 2 mg  2 mg Intravenous Q4H PRN Leroy Sea, MD   2 mg at 08/05/12 1322  . ondansetron (ZOFRAN) tablet 4 mg  4 mg Oral Q6H PRN Eduard Clos, MD       Or  . ondansetron [facility]) injection 4 mg  4 mg Intravenous Q6H PRN Eduard Clos, MD      . pantoprazole (PROTONIX) EC tablet 40 mg  40 mg Oral Daily Eduard Clos, MD   40 mg at 08/05/12 1036  . pantoprazole (PROTONIX) injection 40 mg  40 mg Intravenous Q24H Leroy Sea, MD   40 mg at 08/05/12 1326  . piperacillin-tazobactam (ZOSYN) IVPB 3.375 g  3.375 g Intravenous Q8H Lorenza Evangelist, RPH   3.375 g at 08/05/12 1329  . sodium chloride 0.9 % injection 3 mL  3 mL Intravenous Q12H Eduard Clos, MD       Avelox; Alendronate sodium; Aspirin; Codeine; Doxycycline; Fluconazole; Hydrocodone; Neurontin; Nsaids; Sertraline hcl; and Sulfa antibiotics Family History  Problem Relation Age of Onset  . COPD Mother   . Hypertension Maternal Grandmother    Social History:   reports that she quit smoking about 7 years ago. Her smoking use included Cigarettes. She started smoking about 9 years ago. She has a 17.5 pack-year smoking history. She has never used smokeless tobacco. She reports that she does not drink alcohol or use illicit drugs.   REVIEW OF SYSTEMS - PERTINENT POSITIVES ONLY: No contributory  Physical Exam:   Blood pressure 105/57, pulse 90, temperature 98.4 F (36.9 C), temperature source Oral, resp. rate 20, height 5\' 5"  (1.651 m), weight 94 lb 12.8 oz (43 kg), SpO2 98.00%. Body mass index is 15.78 kg/(m^2).  Gen:  WDWN AAF NAD  Neurological: Alert and oriented to person, place, and time. Motor and sensory function is grossly intact  Head: Normocephalic and atraumatic.  Eyes: Conjunctivae are normal. Pupils are equal, round, and reactive to light. No scleral icterus.  Neck: Normal range of motion. Neck supple. No tracheal deviation or thyromegaly present.  Cardiovascular:  SR without murmurs or gallops.  No carotid bruits Respiratory: Effort normal.  No respiratory distress. No chest wall tenderness. Breath sounds normal.  No wheezes, rales or rhonchi.  Abdomen:  Tender throughout;  Drain with greyish mucoid  material GU: Musculoskeletal: Normal range of motion. Extremities are nontender. No cyanosis, edema or clubbing noted Lymphadenopathy: No cervical, preauricular, postauricular or axillary adenopathy is present Skin: Skin is warm and dry. No rash noted. No diaphoresis. No erythema. No pallor. Pscyh: Normal mood and affect. Behavior is normal. Judgment and thought content normal.   LABORATORY RESULTS: Results for orders placed during the hospital encounter of 08/04/12 (from the past 48 hour(s))  URINALYSIS, ROUTINE W REFLEX MICROSCOPIC     Status: Abnormal   Collection Time    08/04/12 11:03 PM      Result Value Range   Color, Urine YELLOW  YELLOW   APPearance CLOUDY (*) CLEAR  Specific Gravity, Urine 1.018  1.005 - 1.030   pH 5.5  5.0 - 8.0   Glucose, UA NEGATIVE  NEGATIVE mg/dL   Hgb urine dipstick TRACE (*) NEGATIVE   Bilirubin Urine NEGATIVE  NEGATIVE   Ketones, ur NEGATIVE  NEGATIVE mg/dL   Protein, ur NEGATIVE  NEGATIVE mg/dL   Urobilinogen, UA 1.0  0.0 - 1.0 mg/dL   Nitrite NEGATIVE  NEGATIVE   Leukocytes, UA NEGATIVE  NEGATIVE  URINE MICROSCOPIC-ADD ON     Status: None   Collection Time    08/04/12 11:03 PM      Result Value Range   Squamous Epithelial / LPF RARE  RARE   WBC, UA 0-2  <3 WBC/hpf   RBC / HPF 0-2  <3 RBC/hpf   Bacteria, UA RARE  RARE  D-DIMER, QUANTITATIVE     Status: Abnormal   Collection Time    08/04/12 11:20 PM      Result Value Range   D-Dimer, Quant 4.02 (*) 0.00 - 0.48 ug/mL-FEU   Comment:            AT THE INHOUSE ESTABLISHED CUTOFF     VALUE OF 0.48 ug/mL FEU,     THIS ASSAY HAS BEEN DOCUMENTED     IN THE LITERATURE TO HAVE     A SENSITIVITY AND NEGATIVE     PREDICTIVE VALUE OF AT LEAST     98 TO 99%.  THE TEST RESULT     SHOULD BE CORRELATED WITH     AN ASSESSMENT OF THE CLINICAL     PROBABILITY OF DVT / VTE.  CBC WITH DIFFERENTIAL     Status: Abnormal   Collection Time    08/04/12 11:20 PM      Result Value Range   WBC 28.8 (*) 4.0 -  10.5 K/uL   RBC 3.36 (*) 3.87 - 5.11 MIL/uL   Hemoglobin 9.2 (*) 12.0 - 15.0 g/dL   HCT 45.4 (*) 09.8 - 11.9 %   MCV 81.0  78.0 - 100.0 fL   MCH 27.4  26.0 - 34.0 pg   MCHC 33.8  30.0 - 36.0 g/dL   RDW 14.7  82.9 - 56.2 %   Platelets 478 (*) 150 - 400 K/uL   Neutrophils Relative 86 (*) 43 - 77 %   Lymphocytes Relative 7 (*) 12 - 46 %   Monocytes Relative 7  3 - 12 %   Eosinophils Relative 0  0 - 5 %   Basophils Relative 0  0 - 1 %   Neutro Abs 24.8 (*) 1.7 - 7.7 K/uL   Lymphs Abs 2.0  0.7 - 4.0 K/uL   Monocytes Absolute 2.0 (*) 0.1 - 1.0 K/uL   Eosinophils Absolute 0.0  0.0 - 0.7 K/uL   Basophils Absolute 0.0  0.0 - 0.1 K/uL   RBC Morphology TARGET CELLS     Comment: POLYCHROMASIA PRESENT  COMPREHENSIVE METABOLIC PANEL     Status: Abnormal   Collection Time    08/04/12 11:20 PM      Result Value Range   Sodium 135  135 - 145 mEq/L   Potassium 3.8  3.5 - 5.1 mEq/L   Chloride 98  96 - 112 mEq/L   CO2 25  19 - 32 mEq/L   Glucose, Bld 86  70 - 99 mg/dL   BUN 21  6 - 23 mg/dL   Creatinine, Ser 1.30  0.50 - 1.10 mg/dL   Calcium 9.8  8.4 - 86.5  mg/dL   Total Protein 8.5 (*) 6.0 - 8.3 g/dL   Albumin 2.3 (*) 3.5 - 5.2 g/dL   AST 17  0 - 37 U/L   ALT 13  0 - 35 U/L   Alkaline Phosphatase 157 (*) 39 - 117 U/L   Total Bilirubin 0.5  0.3 - 1.2 mg/dL   GFR calc non Af Amer 86 (*) >90 mL/min   GFR calc Af Amer >90  >90 mL/min   Comment:            The eGFR has been calculated     using the CKD EPI equation.     This calculation has not been     validated in all clinical     situations.     eGFR's persistently     <90 mL/min signify     possible Chronic Kidney Disease.  LIPASE, BLOOD     Status: None   Collection Time    08/04/12 11:20 PM      Result Value Range   Lipase 18  11 - 59 U/L  LACTIC ACID, PLASMA     Status: None   Collection Time    08/04/12 11:20 PM      Result Value Range   Lactic Acid, Venous 1.4  0.5 - 2.2 mmol/L  PRO B NATRIURETIC PEPTIDE     Status:  Abnormal   Collection Time    08/04/12 11:20 PM      Result Value Range   Pro B Natriuretic peptide (BNP) 160.9 (*) 0 - 125 pg/mL  TROPONIN I     Status: None   Collection Time    08/04/12 11:20 PM      Result Value Range   Troponin I <0.30  <0.30 ng/mL   Comment:            Due to the release kinetics of cTnI,     a negative result within the first hours     of the onset of symptoms does not rule out     myocardial infarction with certainty.     If myocardial infarction is still suspected,     repeat the test at appropriate intervals.  APTT     Status: None   Collection Time    08/05/12  3:18 AM      Result Value Range   aPTT 34  24 - 37 seconds  PROTIME-INR     Status: Abnormal   Collection Time    08/05/12  3:18 AM      Result Value Range   Prothrombin Time 15.7 (*) 11.6 - 15.2 seconds   INR 1.28  0.00 - 1.49  PREALBUMIN     Status: Abnormal   Collection Time    08/05/12  4:47 AM      Result Value Range   Prealbumin 6.9 (*) 17.0 - 34.0 mg/dL  MAGNESIUM     Status: None   Collection Time    08/05/12  4:47 AM      Result Value Range   Magnesium 1.7  1.5 - 2.5 mg/dL  PHOSPHORUS     Status: None   Collection Time    08/05/12  4:47 AM      Result Value Range   Phosphorus 3.1  2.3 - 4.6 mg/dL  CHOLESTEROL, TOTAL     Status: None   Collection Time    08/05/12  4:47 AM      Result Value Range   Cholesterol 101  0 -  200 mg/dL  TRIGLYCERIDES     Status: None   Collection Time    08/05/12  4:47 AM      Result Value Range   Triglycerides 122  <150 mg/dL  TROPONIN I     Status: None   Collection Time    08/05/12  4:47 AM      Result Value Range   Troponin I <0.30  <0.30 ng/mL   Comment:            Due to the release kinetics of cTnI,     a negative result within the first hours     of the onset of symptoms does not rule out     myocardial infarction with certainty.     If myocardial infarction is still suspected,     repeat the test at appropriate intervals.   HEPATIC FUNCTION PANEL     Status: Abnormal   Collection Time    08/05/12  4:47 AM      Result Value Range   Total Protein 8.0  6.0 - 8.3 g/dL   Albumin 2.2 (*) 3.5 - 5.2 g/dL   AST 23  0 - 37 U/L   ALT 17  0 - 35 U/L   Alkaline Phosphatase 156 (*) 39 - 117 U/L   Total Bilirubin 0.5  0.3 - 1.2 mg/dL   Bilirubin, Direct 0.1  0.0 - 0.3 mg/dL   Indirect Bilirubin 0.4  0.3 - 0.9 mg/dL  BASIC METABOLIC PANEL     Status: Abnormal   Collection Time    08/05/12  4:47 AM      Result Value Range   Sodium 134 (*) 135 - 145 mEq/L   Potassium 3.7  3.5 - 5.1 mEq/L   Chloride 98  96 - 112 mEq/L   CO2 25  19 - 32 mEq/L   Glucose, Bld 95  70 - 99 mg/dL   BUN 18  6 - 23 mg/dL   Creatinine, Ser 4.09  0.50 - 1.10 mg/dL   Calcium 9.6  8.4 - 81.1 mg/dL   GFR calc non Af Amer >90  >90 mL/min   GFR calc Af Amer >90  >90 mL/min   Comment:            The eGFR has been calculated     using the CKD EPI equation.     This calculation has not been     validated in all clinical     situations.     eGFR's persistently     <90 mL/min signify     possible Chronic Kidney Disease.  CBC     Status: Abnormal   Collection Time    08/05/12  4:47 AM      Result Value Range   WBC 29.0 (*) 4.0 - 10.5 K/uL   RBC 3.21 (*) 3.87 - 5.11 MIL/uL   Hemoglobin 8.8 (*) 12.0 - 15.0 g/dL   HCT 91.4 (*) 78.2 - 95.6 %   MCV 79.4  78.0 - 100.0 fL   MCH 27.4  26.0 - 34.0 pg   MCHC 34.5  30.0 - 36.0 g/dL   RDW 21.3  08.6 - 57.8 %   Platelets 503 (*) 150 - 400 K/uL  GRAM STAIN     Status: None   Collection Time    08/05/12  8:50 AM      Result Value Range   Specimen Description ABDOMEN JP FLUID     Special Requests Normal  Gram Stain       Value: CYTOSPIN     WBC PRESENT,BOTH PMN AND MONONUCLEAR     YEAST     GRAM NEGATIVE RODS     GRAM POSITIVE COCCI IN CHAINS IN PAIRS   Report Status 08/05/2012 FINAL    TROPONIN I     Status: None   Collection Time    08/05/12 10:25 AM      Result Value Range   Troponin I  <0.30  <0.30 ng/mL   Comment:            Due to the release kinetics of cTnI,     a negative result within the first hours     of the onset of symptoms does not rule out     myocardial infarction with certainty.     If myocardial infarction is still suspected,     repeat the test at appropriate intervals.  HEPARIN LEVEL (UNFRACTIONATED)     Status: Abnormal   Collection Time    08/05/12 12:05 PM      Result Value Range   Heparin Unfractionated <0.10 (*) 0.30 - 0.70 IU/mL   Comment:            IF HEPARIN RESULTS ARE BELOW     EXPECTED VALUES, AND PATIENT     DOSAGE HAS BEEN CONFIRMED,     SUGGEST FOLLOW UP TESTING     OF ANTITHROMBIN III LEVELS.     REPEATED TO VERIFY     SPECIMEN CHECKED FOR CLOTS  GLUCOSE, CAPILLARY     Status: None   Collection Time    08/05/12 12:13 PM      Result Value Range   Glucose-Capillary 97  70 - 99 mg/dL   Comment 1 Documented in Chart     Comment 2 Notify RN      RADIOLOGY RESULTS: Ct Angio Chest Pe W/cm &/or Wo Cm  08/05/2012  *RADIOLOGY REPORT*  Clinical Data: Shortness of breath.  Sharp left chest pain.  CT ANGIOGRAPHY CHEST  Technique:  Multidetector CT imaging of the chest using the standard protocol during bolus administration of intravenous contrast. Multiplanar reconstructed images including MIPs were obtained and reviewed to evaluate the vascular anatomy.  Contrast: 80mL OMNIPAQUE IOHEXOL 350 MG/ML SOLN  Comparison: CT scan of the chest dated 04/10/2012 and CT scan of the abdomen dated 07/13/2012  Findings: There is an extensive large pulmonary embolus in the pulmonary artery to the left lower lobe.  There is secondary extensive left lower lobe consolidation and interstitial accentuation which may represent pulmonary infarction. Tiny emboli in the proximal portion of the pulmonary artery to the left upper lobe.  This is superimposed on diffuse emphysema.  Heart size is normal. No hilar mediastinal adenopathy.  The right lung is clear except for  some slight atelectasis at the right base.  There is a small chronic pericardial effusion.  No acute osseous abnormality.  The thoracic aorta is normal except for slight calcification. Origins of the brachiocephalic vessels are widely patent.  IMPRESSION:  1.  Large extensive pulmonary embolus in the left lower lobe pulmonary artery.  Smaller emboli in the left upper lobe pulmonary artery. 2.  Possible infarction in the left lower lobe. 3.  Diffuse emphysema.   Original Report Authenticated By: Francene Boyers, M.D.    Ct Abdomen Pelvis W Contrast  08/05/2012  *RADIOLOGY REPORT*  Clinical Data: Abdominal pain.  Recent gastric perforation.  CT ABDOMEN AND PELVIS WITH CONTRAST  Technique:  Multidetector CT imaging of the abdomen and pelvis was performed following the standard protocol during bolus administration of intravenous contrast.  Contrast: 80mL OMNIPAQUE IOHEXOL 350 MG/ML SOLN  Comparison: CT scan dated 07/13/2012  Findings: Drain is present in the upper abdomen anterior to the antrum of the stomach.  There are a few small extraluminal air fluid collections at that site, slightly diminished. There is an area of what is probably residual contrast and a there are fluid collection posterior to the left lobe of the liver seen on image number 26 of series 7.  Thickening of the mucosa of the distal stomach is unchanged. There are a few extraluminal air bubbles anterior to the distal antrum.  I cannot exclude a recurrent perforation based on these images.  Liver, spleen, pancreas, adrenal glands, and kidneys appear stable. Bilateral renal cysts and right renal calculi are unchanged.  No dilated large or small bowel.  Stable 2.5 cm cyst on the right ovary.  IMPRESSION: Interval slight decrease in the fluid and air collections anterior to the stomach adjacent to the soft tissue drain. Persistent or recurrent contrast in the fluid collections anterior to the stomach.  This could represent a recurrent leak.  No other  change.   Original Report Authenticated By: Francene Boyers, M.D.    Dg Chest Port 1 View  08/04/2012  *RADIOLOGY REPORT*  Clinical Data: Chest pain and cough.  PORTABLE CHEST - 1 VIEW  Comparison: Chest CT 07/13/2012.  Findings: The cardiac silhouette, mediastinal and hilar contours are stable.  Persistent left lower lobe process appears to be a combination of a pleural effusion and atelectasis.  Minimal right basilar atelectasis also.  Slight increased interstitial markings could be due to bronchitis or interstitial pneumonitis.  The right PICC line tip is in the distal SVC.  The bony thorax is intact.  IMPRESSION: Persistent left lower lobe process. Possible bronchitis or interstitial pneumonitis.   Original Report Authenticated By: [name], M.D.     Problem List: Patient Active Problem List  Diagnosis  . Depression  . GERD (gastroesophageal reflux disease)  . Osteoarthritis  . Cervical neck pain with evidence of disc disease  . Fibromyalgia  . Community acquired pneumonia  . Back pain  . COPD (chronic obstructive pulmonary disease)  . Pericardial effusion  . Unspecified gastritis and gastroduodenitis without mention of hemorrhage  . Loss of weight  . Anorexia  . 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 [id number]  . 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 Protein-calorie malnutrition, severe  . PE (pulmonary embolism)  . Abdominal pain    Assessment & Plan: Recent large PE and evidence of persistant leak.  Would consider percutaneous drainage.  Otherwise, NPO, IV antibiotics and observation    Matt B. Daphine Deutscher, MD, [facility] [phone] beeper [phone]  08/05/2012 1:39 PM

## 2012-08-05 NOTE — Progress Notes (Signed)
ANTIBIOTIC CONSULT NOTE - INITIAL  Pharmacy Consult for Zosyn Indication: Intra-abdominal infection  Allergies  Allergen Reactions  . Avelox (Moxifloxacin Hcl In Nacl) Nausea And Vomiting  . Alendronate Sodium Nausea And Vomiting  . Aspirin Nausea Only  . Codeine Nausea And Vomiting  . Doxycycline   . Fluconazole   . Hydrocodone Nausea And Vomiting    GI distress  . Neurontin (Gabapentin) Other (See Comments)    Mood changes   . Nsaids Other (See Comments)    Severe gastritis & perforation - avoid NSAIDs when possible  . Sertraline Hcl Other (See Comments)    Hallucinations   . Sulfa Antibiotics Rash    Patient Measurements: Height: 5\' 5"  (165.1 cm) Weight: 94 lb 12.8 oz (43 kg) IBW/kg (Calculated) : 57   Vital Signs: Temp: 98.2 F (36.8 C) (04/15 0350) Temp src: Oral (04/15 0350) BP: 119/66 mmHg (04/15 0350) Pulse Rate: 97 (04/15 0350) Intake/Output from previous day: 04/14 0701 - 04/15 0700 In: 75 [I.V.:75] Out: -  Intake/Output from this shift: Total I/O In: 75 [I.V.:75] Out: -   Labs:  Recent Labs  08/04/12 2320  WBC 28.8*  HGB 9.2*  PLT 478*  CREATININE 0.77   Estimated Creatinine Clearance: 47.6 ml/min (by C-G formula based on Cr of 0.77). No results found for this basename: Rolm Gala, VANCORANDOM, GENTTROUGH, GENTPEAK, GENTRANDOM, TOBRATROUGH, TOBRAPEAK, TOBRARND, AMIKACINPEAK, AMIKACINTROU, AMIKACIN,  in the last 72 hours   Microbiology: Recent Results (from the past 720 hour(s))  URINE CULTURE     Status: None   Collection Time    07/13/12  8:35 AM      Result Value Range Status   Specimen Description URINE, RANDOM   Final   Special Requests NONE   Final   Culture  Setup Time 07/13/2012 18:05   Final   Colony Count 8,000 COLONIES/ML   Final   Culture INSIGNIFICANT GROWTH   Final   Report Status 07/14/2012 FINAL   Final    Medical History: Past Medical History  Diagnosis Date  . COPD (chronic obstructive pulmonary  disease)   . Pneumonia 12-2011  . GERD (gastroesophageal reflux disease)   . Headache   . Arthritis     osteoarthritis  . Allergy   . Depression   . Neuromuscular disorder   . Osteoporosis   . Bronchitis     CURRENTLY AS OF 06/30/12 - HAS COUGH AND FINISHED ANTIBIOTIC FOR BRONCHITIS  . Fibromyalgia   . Pain     ABDOMINAL PAIN AND NAUSEA  . Pain     SOMETIMES PAIN RIGHT EAR AND NECK--STATES CAUSED BY A "LUMP" ON BACK OF EAR--USES KENALOG CREAM TOPICALLY AS NEEDED.    Medications:  Scheduled:  . famotidine  20 mg Oral BID  . fentaNYL  250 mcg Transdermal Q72H  . FLUoxetine  20 mg Oral QAC breakfast  . heparin  2,500 Units Intravenous Once  . pantoprazole  40 mg Oral Daily  . piperacillin-tazobactam (ZOSYN)  IV  3.375 g Intravenous Q8H  . sodium chloride  3 mL Intravenous Q12H   Infusions:  . sodium chloride 75 mL/hr at 08/05/12 0304  . heparin     Assessment: 67 yo s/p recent admission for gastric perforation and peritonitis-MD ordering Zosyn per Rx.  Goal of Therapy:  Treat infection  Plan:   Zosyn 3.375 Gm IV q8h EI infusion  F/U SCr/cultures as needed.  Lorenza Evangelist 08/05/2012,4:20 AM

## 2012-08-05 NOTE — Progress Notes (Addendum)
PARENTERAL NUTRITION CONSULT NOTE - Initial  Pharmacy Consult for TNA Indication: Recurrent gastric perforation, prolonged NPO status  Allergies  Allergen Reactions  . Avelox (Moxifloxacin Hcl In Nacl) Nausea And Vomiting  . Alendronate Sodium Nausea And Vomiting  . Aspirin Nausea Only  . Codeine Nausea And Vomiting  . Doxycycline   . Fluconazole   . Hydrocodone Nausea And Vomiting    GI distress  . Neurontin (Gabapentin) Other (See Comments)    Mood changes   . Nsaids Other (See Comments)    Severe gastritis & perforation - avoid NSAIDs when possible  . Sertraline Hcl Other (See Comments)    Hallucinations   . Sulfa Antibiotics Rash    Patient Measurements: Height: 5\' 5"  (165.1 cm) Weight: 94 lb 12.8 oz (43 kg) IBW/kg (Calculated) : 57 Usual Weight: ~44kg (stable)  Vital Signs: Temp: 98.5 F (36.9 C) (04/15 0800) Temp src: Oral (04/15 0800) BP: 106/73 mmHg (04/15 0800) Pulse Rate: 92 (04/15 0800) Intake/Output from previous day: 04/14 0701 - 04/15 0700 In: 300 [I.V.:300] Out: 100 [Drains:100] Intake/Output from this shift:    Labs:  Recent Labs  08/04/12 2320 08/05/12 0318 08/05/12 0447  WBC 28.8*  --  29.0*  HGB 9.2*  --  8.8*  HCT 27.2*  --  25.5*  PLT 478*  --  503*  APTT  --  34  --   INR  --  1.28  --      Recent Labs  08/04/12 2320 08/05/12 0447  NA 135 134*  K 3.8 3.7  CL 98 98  CO2 25 25  GLUCOSE 86 95  BUN 21 18  CREATININE 0.77 0.62  CALCIUM 9.8 9.6  MG  --  1.7  PHOS  --  3.1  PROT 8.5* 8.0  ALBUMIN 2.3* 2.2*  AST 17 23  ALT 13 17  ALKPHOS 157* 156*  BILITOT 0.5 0.5  BILIDIR  --  0.1  IBILI  --  0.4  TRIG  --  122  CHOL  --  101  Corrected Ca 10.66 (3/25) Estimated Creatinine Clearance: 47.6 ml/min (by C-G formula based on Cr of 0.62).    Medications:  Scheduled:  . famotidine  20 mg Oral BID  . fentaNYL  250 mcg Transdermal Q72H  . FLUoxetine  20 mg Oral QAC breakfast  . [COMPLETED] heparin  2,500 Units  Intravenous Once  . pantoprazole  40 mg Oral Daily  . piperacillin-tazobactam (ZOSYN)  IV  3.375 g Intravenous Q8H  . sodium chloride  3 mL Intravenous Q12H   Infusions:  . sodium chloride 75 mL/hr at 08/05/12 0800  . heparin 750 Units/hr (08/05/12 0800)    Insulin Requirements in the past 24 hours:  No orders. CBGs controlled last admission  Current Nutrition:  Home TPN: Dex 312gm, protein 78g, lipids 50mg  Tu/th, over 18h infusion Previous admission patient received Clinimix E5/20 at 7ml/hr  IVF: NS at 32ml/hr  Nutritional Goals:  RD recs (4/15): Kcal: 9604-5409, Protein: 65-77 grams, Fluid: 1.8 L Clinimix  5/20 at 65 ml/hr + Lipids MWF = 78 g protein/day and an average of 1579 kcal  Assessment: 66 y/oF with several recent hospitalizations 3/12-3/14 for supraumbilical hernia repair and gastric biopsy, then admitted with gastric perforation s/p repair on 07/13/12 and home on TPN 07/26/12 (started 3/24). She represents with SOB (+PE) and abd pain with CT revealing possible recurrent leak.   Glucose: serum glucose = 95 Electrolytes: Na = 134, corr ca = 11.04 (sl elevated),  phos=3.1 LFTs: AST/ALT WNLlbumin low TGs: 122 (4/15) Prealbumin: pending for 4/15, 8.1 (3/31), <3 (3/25)    Plan: at 18:00 tonight  Start clinimix E5/15, continue cycle over 18hrs (as at home)  Change to 24h infusion if becomes more appropriate during admission  Fat emulsion at 13.76ml/hr over 18h (MWF only due to ongoing shortage).   MVI/TE MWF d/t national backorder  Continue CBGs q6h, with sensitive scale SSI.  Change to q8h if tolerates cycle  KVO MIVF at 6pm  TNA lab panels on Mondays & Thursdays.  CMP and Mg ordered daily by physician  Check phos in am  Juliette Alcide, PharmD, BCPS.   Pager: [phone] 8:24 AM 08/05/2012

## 2012-08-05 NOTE — Progress Notes (Signed)
ANTICOAGULATION CONSULT NOTE - Initial Consult  Pharmacy Consult for Heparin Indication: pulmonary embolus  Allergies  Allergen Reactions  . Avelox (Moxifloxacin Hcl In Nacl) Nausea And Vomiting  . Alendronate Sodium Nausea And Vomiting  . Aspirin Nausea Only  . Codeine Nausea And Vomiting  . Doxycycline   . Fluconazole   . Hydrocodone Nausea And Vomiting    GI distress  . Neurontin (Gabapentin) Other (See Comments)    Mood changes   . Nsaids Other (See Comments)    Severe gastritis & perforation - avoid NSAIDs when possible  . Sertraline Hcl Other (See Comments)    Hallucinations   . Sulfa Antibiotics Rash    Patient Measurements: Height: 5\' 5"  (165.1 cm) Weight: 94 lb 12.8 oz (43 kg) IBW/kg (Calculated) : 57   Vital Signs: Temp: 98.2 F (36.8 C) (04/15 0350) Temp src: Oral (04/15 0350) BP: 119/66 mmHg (04/15 0350) Pulse Rate: 97 (04/15 0350)  Labs:  Recent Labs  08/04/12 2320 08/05/12 0318  HGB 9.2*  --   HCT 27.2*  --   PLT 478*  --   APTT  --  34  LABPROT  --  15.7*  INR  --  1.28  CREATININE 0.77  --   TROPONINI <0.30  --     Estimated Creatinine Clearance: 47.6 ml/min (by C-G formula based on Cr of 0.77).   Medical History: Past Medical History  Diagnosis Date  . COPD (chronic obstructive pulmonary disease)   . Pneumonia 12-2011  . GERD (gastroesophageal reflux disease)   . Headache   . Arthritis     osteoarthritis  . Allergy   . Depression   . Neuromuscular disorder   . Osteoporosis   . Bronchitis     CURRENTLY AS OF 06/30/12 - HAS COUGH AND FINISHED ANTIBIOTIC FOR BRONCHITIS  . Fibromyalgia   . Pain     ABDOMINAL PAIN AND NAUSEA  . Pain     SOMETIMES PAIN RIGHT EAR AND NECK--STATES CAUSED BY A "LUMP" ON BACK OF EAR--USES KENALOG CREAM TOPICALLY AS NEEDED.    Medications:  Scheduled:  . famotidine  20 mg Oral BID  . fentaNYL  250 mcg Transdermal Q72H  . FLUoxetine  20 mg Oral QAC breakfast  . heparin  2,500 Units Intravenous  Once  . pantoprazole  40 mg Oral Daily  . piperacillin-tazobactam (ZOSYN)  IV  3.375 g Intravenous Q8H  . sodium chloride  3 mL Intravenous Q12H   Infusions:  . sodium chloride 75 mL/hr at 08/05/12 0304  . heparin      Assessment: 67 yo c/o chest and abdominal pain- found to have PE.  MD ordering IV heparin per Rx.  Goal of Therapy:  Heparin level 0.3-0.7 units/ml Monitor platelets by anticoagulation protocol: Yes   Plan:   Baseline coags now  Heparin 2500 unit bolus x1  Start drip @ 750 units/hr  Daily HL/CBC  Susanne Greenhouse R 08/05/2012,4:10 AM

## 2012-08-05 NOTE — Progress Notes (Signed)
Wound culture results text paged to Dr.Singh. Will continue to monitor.

## 2012-08-05 NOTE — Progress Notes (Signed)
VASCULAR LAB PRELIMINARY  PRELIMINARY  PRELIMINARY  PRELIMINARY  Bilateral lower extremity venous duplex completed.  Bilateral upper extremity venous duplex completed  Preliminary report:  No evidence of upper extremity DVT or superficial thrombosis. No evidence of lower extremity DVT, superficial thrombosis, or Baker's cyst.  Tamla Winkels, RVS 08/05/2012, 11:07 AM

## 2012-08-05 NOTE — Progress Notes (Addendum)
[facility]                                                                                Patient Demographics  Cynthia Bowen, is a 67 y.o. female, DOB - 1946/03/11, ZOX:[id number], WJX:[id number]  Admit date - 08/04/2012  Admitting Physician Eduard Clos, MD  Outpatient Primary MD for the patient is [name], [name], MD  LOS - 1   Chief Complaint  Patient presents with  . Chest Pain  . Abdominal Pain        Assessment & Plan   Summary   67 year old [place] American female who has had a complex abdominal surgical history in the last few months, according to old chart review and discussions with surgery patient had initial incarcerated epigastric hernia repair and 07/02/2012, this was complicated by gastric perforation with abscess/peritonitis, status post omental patch repair x2 in March of 2014, she required PICC line placement for TNA, she had an abdominal drain placed, and discharged on 07/26/2012 by general surgery, she came back on 08/05/2012 with chest and abdominal pain, in the ER workup was consistent of acute bilateral PEs she was placed on heparin drip, also she had leukocytosis and an abdominal CT was suggestive of possible another bowel leak. Hospitalist were requested to admit the patient, general surgery on consult.      1. Bilateral PE in the setting of decreased activity, overhydration, recent multiple abdominal surgeries. Patient also has evidence of new bowel leak along with leukocytosis and may need another surgical procedure, for now I agree with IV heparin drip. She is currently hemodynamically stable and will be continued to be monitored in stepped-down setting. She is currently chest pain and shortness of breath free.  Upper and lower extremity Dopplers are pending.    2. Abdominal pain with recent 3 abdominal surgeries in March of 2014 by general surgery including incarcerated epigastric hernia repair, gastric perforation,  abdominal abscess and peritonitis of omental patch repair all in March of 2014. For now Zosyn to be continued, she is allergic to fluconazole, CT abdomen showing possible bowel leak, general surgery seeing the patient shortly will defer further management of this problem to general surgery. Bowel rest with TNA via previous PICC line to continue, pharmacy monitoring. Cultures from already noted, bugs could be colonized, have discussed with ID physician Dr. Orvan Falconer and at this point since she is allergic to fluconazole micafungin will be added.   We'll request surgery to please obtain new aerobic and anaerobic cultures from new abdominal drain is placed this admission.    3. Leukocytosis reactionary due to #2 above. Monitor clinically.   4. COPD currently at baseline exam not consistent with any exacerbation as needed oxygen and nebulizer treatments.   5. Protein calorie malnutrition. TNA per pharmacy      Code Status: Full  Family Communication: patient  Disposition Plan: TBD   Procedures CT angio chest, CT Abd-Pelvis, previous PICC  Upper and lower extremity Dopplers are pending.  Consults  CCS. ID Dr. Orvan Falconer over the phone   DVT Prophylaxis    Heparin   Lab Results  Component Value Date   PLT 503* 08/05/2012  Medications  Scheduled Meds: . famotidine  20 mg Oral BID  . fentaNYL  250 mcg Transdermal Q72H  . FLUoxetine  20 mg Oral QAC breakfast  . pantoprazole  40 mg Oral Daily  . piperacillin-tazobactam (ZOSYN)  IV  3.375 g Intravenous Q8H  . sodium chloride  3 mL Intravenous Q12H   Continuous Infusions: . sodium chloride 75 mL/hr at 08/05/12 0800  . heparin 750 Units/hr (08/05/12 0800)   PRN Meds:.acetaminophen, acetaminophen, albuterol, diphenhydrAMINE, HYDROcodone-acetaminophen, ondansetron (ZOFRAN) IV, ondansetron  Antibiotics     Anti-infectives   Start     Dose/Rate Route Frequency Ordered Stop   08/05/12 0500  piperacillin-tazobactam (ZOSYN) IVPB  3.375 g     3.375 g 12.5 mL/hr over 240 Minutes Intravenous 3 times per day 08/05/12 0408         Time Spent in minutes   45   SINGH,PRASHANT K M.D on 08/05/2012 at 8:29 AM  Between 7am to 7pm - Pager - [phone]  After 7pm go to [email or web address] - password [email or web address]  And look for the night coverage person covering for me after hours  [facility] Office  [phone]    Subjective:   Cynthia Bowen today has, No headache, No chest pain No SOB now , + abdominal pain - No Nausea, No new weakness tingling or numbness, No Cough .   Objective:   Filed Vitals:   08/05/12 0300 08/05/12 0350 08/05/12 0400 08/05/12 0800  BP: 122/74 119/66 119/66 106/73  Pulse: 99 97 98 92  Temp:  98.2 F (36.8 C)  98.5 F (36.9 C)  TempSrc:  Oral  Oral  Resp: 19 24 24 18   Height:  5\' 5"  (1.651 m)    Weight:  43 kg (94 lb 12.8 oz)    SpO2: 96% 92% 91% 99%    Wt Readings from Last 3 Encounters:  08/05/12 43 kg (94 lb 12.8 oz)  07/14/12 44.3 kg (97 lb 10.6 oz)  07/14/12 44.3 kg (97 lb 10.6 oz)     Intake/Output Summary (Last 24 hours) at 08/05/12 0829 Last data filed at 08/05/12 0700  Gross per 24 hour  Intake    300 ml  Output    100 ml  Net    200 ml    Exam Awake Alert, Oriented X 3, No new F.N deficits, Normal affect [place].AT,PERRAL Supple Neck,No JVD, No cervical lymphadenopathy appriciated.  Symmetrical Chest wall movement, Good air movement bilaterally, CTAB RRR,No Gallops,Rubs or new Murmurs, No Parasternal Heave Hypo active B.Sounds, Abd Soft but tender, No organomegaly appriciated, No rebound - guarding or rigidity. Abd drain in place No Cyanosis, Clubbing or edema, No new Rash or bruise     Data Review   Micro Results No results found for this or any previous visit (from the past 240 hour(s)).     Ct Angio Chest Pe W/cm &/or Wo Cm  08/05/2012  *RADIOLOGY REPORT*  Clinical Data: Shortness of breath.  Sharp left chest pain.  CT ANGIOGRAPHY CHEST  Technique:   Multidetector CT imaging of the chest using the standard protocol during bolus administration of intravenous contrast. Multiplanar reconstructed images including MIPs were obtained and reviewed to evaluate the vascular anatomy.  Contrast: 80mL OMNIPAQUE IOHEXOL 350 MG/ML SOLN  Comparison: CT scan of the chest dated 04/10/2012 and CT scan of the abdomen dated 07/13/2012  Findings: There is an extensive large pulmonary embolus in the pulmonary artery to the left lower lobe.  There is secondary extensive left lower lobe  consolidation and interstitial accentuation which may represent pulmonary infarction. Tiny emboli in the proximal portion of the pulmonary artery to the left upper lobe.  This is superimposed on diffuse emphysema.  Heart size is normal. No hilar mediastinal adenopathy.  The right lung is clear except for some slight atelectasis at the right base.  There is a small chronic pericardial effusion.  No acute osseous abnormality.  The thoracic aorta is normal except for slight calcification. Origins of the brachiocephalic vessels are widely patent.  IMPRESSION:  1.  Large extensive pulmonary embolus in the left lower lobe pulmonary artery.  Smaller emboli in the left upper lobe pulmonary artery. 2.  Possible infarction in the left lower lobe. 3.  Diffuse emphysema.   Original Report Authenticated By: Francene Boyers, M.D.    Ct Abdomen Pelvis W Contrast  08/05/2012  *RADIOLOGY REPORT*  Clinical Data: Abdominal pain.  Recent gastric perforation.  CT ABDOMEN AND PELVIS WITH CONTRAST  Technique:  Multidetector CT imaging of the abdomen and pelvis was performed following the standard protocol during bolus administration of intravenous contrast.  Contrast: 80mL OMNIPAQUE IOHEXOL 350 MG/ML SOLN  Comparison: CT scan dated 07/13/2012  Findings: Drain is present in the upper abdomen anterior to the antrum of the stomach.  There are a few small extraluminal air fluid collections at that site, slightly diminished.  There is an area of what is probably residual contrast and a there are fluid collection posterior to the left lobe of the liver seen on image number 26 of series 7.  Thickening of the mucosa of the distal stomach is unchanged. There are a few extraluminal air bubbles anterior to the distal antrum.  I cannot exclude a recurrent perforation based on these images.  Liver, spleen, pancreas, adrenal glands, and kidneys appear stable. Bilateral renal cysts and right renal calculi are unchanged.  No dilated large or small bowel.  Stable 2.5 cm cyst on the right ovary.  IMPRESSION: Interval slight decrease in the fluid and air collections anterior to the stomach adjacent to the soft tissue drain. Persistent or recurrent contrast in the fluid collections anterior to the stomach.  This could represent a recurrent leak.  No other change.   Original Report Authenticated By: Francene Boyers, M.D.          Dg Chest Port 1 View  08/04/2012  *RADIOLOGY REPORT*  Clinical Data: Chest pain and cough.  PORTABLE CHEST - 1 VIEW  Comparison: Chest CT 07/13/2012.  Findings: The cardiac silhouette, mediastinal and hilar contours are stable.  Persistent left lower lobe process appears to be a combination of a pleural effusion and atelectasis.  Minimal right basilar atelectasis also.  Slight increased interstitial markings could be due to bronchitis or interstitial pneumonitis.  The right PICC line tip is in the distal SVC.  The bony thorax is intact.  IMPRESSION: Persistent left lower lobe process. Possible bronchitis or interstitial pneumonitis.   Original Report Authenticated By: [name], M.D.       CBC  Recent Labs Lab 08/04/12 2320 08/05/12 0447  WBC 28.8* 29.0*  HGB 9.2* 8.8*  HCT 27.2* 25.5*  PLT 478* 503*  MCV 81.0 79.4  MCH 27.4 27.4  MCHC 33.8 34.5  RDW 15.5 15.3  LYMPHSABS 2.0  --   MONOABS 2.0*  --   EOSABS 0.0  --   BASOSABS 0.0  --     Chemistries   Recent Labs Lab 08/04/12 2320  08/05/12 0447  NA 135 134*  K 3.8  3.7  CL 98 98  CO2 25 25  GLUCOSE 86 95  BUN 21 18  CREATININE 0.77 0.62  CALCIUM 9.8 9.6  MG  --  1.7  AST 17 23  ALT 13 17  ALKPHOS 157* 156*  BILITOT 0.5 0.5   ------------------------------------------------------------------------------------------------------------------ estimated creatinine clearance is 47.6 ml/min (by C-G formula based on Cr of 0.62). ------------------------------------------------------------------------------------------------------------------ No results found for this basename: HGBA1C,  in the last 72 hours ------------------------------------------------------------------------------------------------------------------  Recent Labs  08/05/12 0447  CHOL 101  TRIG 122   ------------------------------------------------------------------------------------------------------------------ No results found for this basename: TSH, T4TOTAL, FREET3, T3FREE, THYROIDAB,  in the last 72 hours ------------------------------------------------------------------------------------------------------------------ No results found for this basename: VITAMINB12, FOLATE, FERRITIN, TIBC, IRON, RETICCTPCT,  in the last 72 hours  Coagulation profile  Recent Labs Lab 08/05/12 0318  INR 1.28     Recent Labs  08/04/12 2320  DDIMER 4.02*    Cardiac Enzymes  Recent Labs Lab 08/04/12 2320 08/05/12 0447  TROPONINI <0.30 <0.30   ------------------------------------------------------------------------------------------------------------------ No components found with this basename: POCBNP,

## 2012-08-05 NOTE — Progress Notes (Signed)
CARE MANAGEMENT NOTE 08/05/2012  Patient:  Cynthia Bowen, Cynthia Bowen   Account Number:  [id number]  Date Initiated:  08/05/2012  Documentation initiated by:  Lundy Cozart  Subjective/Objective Assessment:   history of recent gastric perforation and repair dcd to home earlier this month, represented with large lllPE     Action/Plan:   iv tpn chroniclly, lives with children tpn managed at home with [facility] and nepew who is trained on the tpn.   Anticipated DC Date:  08/08/2012   Anticipated DC Plan:  HOME W HOME HEALTH SERVICES  In-house referral  NA      DC Planning Services  CM consult      [facility] Choice  HOME HEALTH   Choice offered to / List presented to:  NA   DME arranged  NA      DME agency  NA     HH arranged  NA      HH agency  [facility]   Status of service:  In process, will continue to follow Medicare Important Message given?  NA - LOS <3 / Initial given by admissions (If response is "NO", the following Medicare IM given date fields will be blank) Date Medicare IM given:   Date Additional Medicare IM given:    Discharge Disposition:    Per UR Regulation:  Reviewed for med. necessity/level of care/duration of stay  If discussed at Long Length of Stay Meetings, dates discussed:    Comments:  [id number] Earlene Plater, RN, BSN, CCM:  CHART REVIEWED AND UPDATED.  Next chart review due on [id number]. NO DISCHARGE NEEDS PRESENT AT THIS TIME. CASE MANAGEMENT [phone]

## 2012-08-05 NOTE — Progress Notes (Addendum)
ANTICOAGULATION CONSULT NOTE - Follow-up Consult  Pharmacy Consult for Heparin Indication: pulmonary embolus  Allergies  Allergen Reactions  . Avelox (Moxifloxacin Hcl In Nacl) Nausea And Vomiting  . Alendronate Sodium Nausea And Vomiting  . Aspirin Nausea Only  . Codeine Nausea And Vomiting  . Doxycycline   . Fluconazole   . Hydrocodone Nausea And Vomiting    GI distress  . Neurontin (Gabapentin) Other (See Comments)    Mood changes   . Nsaids Other (See Comments)    Severe gastritis & perforation - avoid NSAIDs when possible  . Sertraline Hcl Other (See Comments)    Hallucinations   . Sulfa Antibiotics Rash    Patient Measurements: Height: 5\' 5"  (165.1 cm) Weight: 94 lb 12.8 oz (43 kg) IBW/kg (Calculated) : 57   Vital Signs: Temp: 98.4 F (36.9 C) (04/15 1200) Temp src: Oral (04/15 1200) BP: 105/57 mmHg (04/15 1100) Pulse Rate: 90 (04/15 1100)  Labs:  Recent Labs  08/04/12 2320 08/05/12 0318 08/05/12 0447 08/05/12 1025 08/05/12 1205  HGB 9.2*  --  8.8*  --   --   HCT 27.2*  --  25.5*  --   --   PLT 478*  --  503*  --   --   APTT  --  34  --   --   --   LABPROT  --  15.7*  --   --   --   INR  --  1.28  --   --   --   HEPARINUNFRC  --   --   --   --  <0.10*  CREATININE 0.77  --  0.62  --   --   TROPONINI <0.30  --  <0.30 <0.30  --     Estimated Creatinine Clearance: 47.6 ml/min (by C-G formula based on Cr of 0.62).   Medical History: Past Medical History  Diagnosis Date  . COPD (chronic obstructive pulmonary disease)   . Pneumonia 12-2011  . GERD (gastroesophageal reflux disease)   . Headache   . Arthritis     osteoarthritis  . Allergy   . Depression   . Neuromuscular disorder   . Osteoporosis   . Bronchitis     CURRENTLY AS OF 06/30/12 - HAS COUGH AND FINISHED ANTIBIOTIC FOR BRONCHITIS  . Fibromyalgia   . Pain     ABDOMINAL PAIN AND NAUSEA  . Pain     SOMETIMES PAIN RIGHT EAR AND NECK--STATES CAUSED BY A "LUMP" ON BACK OF EAR--USES  KENALOG CREAM TOPICALLY AS NEEDED.    Medications:  Scheduled:  . fentaNYL  250 mcg Transdermal Q72H  . [COMPLETED] heparin  2,500 Units Intravenous Once  . insulin aspart  0-9 Units Subcutaneous Q6H  . micafungin (MYCAMINE) IV  100 mg Intravenous Daily  . pantoprazole  40 mg Oral Daily  . pantoprazole (PROTONIX) IV  40 mg Intravenous Q24H  . piperacillin-tazobactam (ZOSYN)  IV  3.375 g Intravenous Q8H  . sodium chloride  3 mL Intravenous Q12H  . [DISCONTINUED] famotidine  20 mg Oral BID  . [DISCONTINUED] FLUoxetine  20 mg Oral QAC breakfast   Infusions:  . Marland KitchenTPN (CLINIMIX-E) Adult    . sodium chloride 75 mL/hr at 08/05/12 0800  . sodium chloride    . heparin 750 Units/hr (08/05/12 0800)    Assessment: 67 yo c/o chest and abdominal pain- found to have large PE in pulmonary artery to the LLL. Heparin started 4/15am. Baseline INR = 1.28  Initial heparin level <0.1  at rate 750units/hr  Heparin infusing at correct rate  CBC: hgb=8.8, plts=503  No bleeding noted  Goal of Therapy:  Heparin level 0.3-0.7 units/ml Monitor platelets by anticoagulation protocol: Yes   Plan:   Heparin 1250 unit bolus then Increase heparin gtt rate to 900 units/hr  Check heparin level in 6hrs  Daily HL/CBC  Dannielle Huh 08/05/2012,1:40 PM

## 2012-08-06 ENCOUNTER — Inpatient Hospital Stay (HOSPITAL_COMMUNITY): Payer: Medicare Other

## 2012-08-06 DIAGNOSIS — R042 Hemoptysis: Secondary | ICD-10-CM | POA: Diagnosis not present

## 2012-08-06 DIAGNOSIS — K319 Disease of stomach and duodenum, unspecified: Secondary | ICD-10-CM

## 2012-08-06 DIAGNOSIS — M549 Dorsalgia, unspecified: Secondary | ICD-10-CM

## 2012-08-06 LAB — HEPARIN LEVEL (UNFRACTIONATED)
Heparin Unfractionated: 0.27 IU/mL — ABNORMAL LOW (ref 0.30–0.70)
Heparin Unfractionated: 0.22 IU/mL — ABNORMAL LOW (ref 0.30–0.70)

## 2012-08-06 LAB — IRON AND TIBC
Iron: 14 ug/dL — ABNORMAL LOW (ref 42–135)
Saturation Ratios: 7 % — ABNORMAL LOW (ref 20–55)
TIBC: 199 ug/dL — ABNORMAL LOW (ref 250–470)
UIBC: 185 ug/dL (ref 125–400)

## 2012-08-06 LAB — COMPREHENSIVE METABOLIC PANEL
Alkaline Phosphatase: 134 U/L — ABNORMAL HIGH (ref 39–117)
BUN: 23 mg/dL (ref 6–23)
CO2: 27 mEq/L (ref 19–32)
GFR calc Af Amer: >90 mL/min (ref >90)
GFR calc non Af Amer: 89 mL/min — ABNORMAL LOW (ref >90)
Glucose, Bld: 126 mg/dL — ABNORMAL HIGH (ref 70–99)
Potassium: 3.7 mEq/L (ref 3.5–5.1)
Total Bilirubin: 0.4 mg/dL (ref 0.3–1.2)
Total Protein: 7 g/dL (ref 6.0–8.3)

## 2012-08-06 LAB — DIFFERENTIAL
Basophils Absolute: 0 10*3/uL (ref 0.0–0.1)
Lymphocytes Relative: 8 % — ABNORMAL LOW (ref 12–46)
Monocytes Absolute: 1.5 10*3/uL — ABNORMAL HIGH (ref 0.1–1.0)
Monocytes Relative: 8 % (ref 3–12)
Neutro Abs: 16 10*3/uL — ABNORMAL HIGH (ref 1.7–7.7)

## 2012-08-06 LAB — URINE CULTURE: Colony Count: NO GROWTH

## 2012-08-06 LAB — GLUCOSE, CAPILLARY
Glucose-Capillary: 104 mg/dL — ABNORMAL HIGH (ref 70–99)
Glucose-Capillary: 120 mg/dL — ABNORMAL HIGH (ref 70–99)
Glucose-Capillary: 143 mg/dL — ABNORMAL HIGH (ref 70–99)

## 2012-08-06 LAB — CBC
HCT: 22.8 % — ABNORMAL LOW (ref 36.0–46.0)
Hemoglobin: 7.7 g/dL — ABNORMAL LOW (ref 12.0–15.0)
MCHC: 33.8 g/dL (ref 30.0–36.0)

## 2012-08-06 LAB — MAGNESIUM: Magnesium: 2 mg/dL (ref 1.5–2.5)

## 2012-08-06 LAB — RETICULOCYTES: Retic Ct Pct: 2.3 % (ref 0.4–3.1)

## 2012-08-06 LAB — FERRITIN: Ferritin: 371 ng/mL — ABNORMAL HIGH (ref 10–291)

## 2012-08-06 MED ORDER — TRACE MINERALS CR-CU-F-FE-I-MN-MO-SE-ZN IV SOLN
INTRAVENOUS | Status: AC
  Administered 2012-08-06: 17:00:00 via INTRAVENOUS
  Filled 2012-08-06: qty 2000

## 2012-08-06 MED ORDER — HEPARIN BOLUS VIA INFUSION
600.0000 [IU] | Freq: Once | INTRAVENOUS | Status: AC
  Administered 2012-08-06: 600 [IU] via INTRAVENOUS
  Filled 2012-08-06: qty 600

## 2012-08-06 MED ORDER — FAT EMULSION 20 % IV EMUL
250.0000 mL | INTRAVENOUS | Status: AC
  Administered 2012-08-06: 250 mL via INTRAVENOUS
  Filled 2012-08-06: qty 250

## 2012-08-06 MED ORDER — SODIUM CHLORIDE 0.9 % IV BOLUS (SEPSIS)
500.0000 mL | Freq: Once | INTRAVENOUS | Status: AC
  Administered 2012-08-06: 500 mL via INTRAVENOUS

## 2012-08-06 MED ORDER — HEPARIN BOLUS VIA INFUSION
1500.0000 [IU] | Freq: Once | INTRAVENOUS | Status: AC
  Administered 2012-08-06: 1500 [IU] via INTRAVENOUS
  Filled 2012-08-06: qty 1500

## 2012-08-06 MED ORDER — MORPHINE SULFATE 4 MG/ML IJ SOLN
4.0000 mg | Freq: Once | INTRAMUSCULAR | Status: AC
  Administered 2012-08-06: 4 mg via INTRAVENOUS
  Filled 2012-08-06: qty 1

## 2012-08-06 NOTE — Progress Notes (Signed)
ANTICOAGULATION CONSULT NOTE - Follow-up Consult  Pharmacy Consult for Heparin Indication: pulmonary embolus  Allergies  Allergen Reactions  . Avelox (Moxifloxacin Hcl In Nacl) Nausea And Vomiting  . Alendronate Sodium Nausea And Vomiting  . Aspirin Nausea Only  . Codeine Nausea And Vomiting  . Doxycycline   . Fluconazole   . Hydrocodone Nausea And Vomiting    GI distress  . Neurontin (Gabapentin) Other (See Comments)    Mood changes   . Nsaids Other (See Comments)    Severe gastritis & perforation - avoid NSAIDs when possible  . Sertraline Hcl Other (See Comments)    Hallucinations   . Sulfa Antibiotics Rash    Patient Measurements: Height: 5\' 5"  (165.1 cm) Weight: 91 lb 14.9 oz (41.7 kg) IBW/kg (Calculated) : 57   Vital Signs: Temp: 99.5 F (37.5 C) (04/16 0800) Temp src: Oral (04/16 0800) BP: 100/53 mmHg (04/16 0617)  Labs:  Recent Labs  08/04/12 2320 08/05/12 0318 08/05/12 0447 08/05/12 1025  08/05/12 1556 08/05/12 2020 08/06/12 0345 08/06/12 1205  HGB 9.2*  --  8.8*  --   --   --   --  7.7* 7.6*  HCT 27.2*  --  25.5*  --   --   --   --  22.8* 22.1*  PLT 478*  --  503*  --   --   --   --  458*  --   APTT  --  34  --   --   --   --   --   --   --   LABPROT  --  15.7*  --   --   --   --   --   --   --   INR  --  1.28  --   --   --   --   --   --   --   HEPARINUNFRC  --   --   --   --   < >  --  <0.10* 0.10* 0.27*  CREATININE 0.77  --  0.62  --   --   --   --  0.69  --   TROPONINI <0.30  --  <0.30 <0.30  --  <0.30  --   --   --   < > = values in this interval not displayed.  Estimated Creatinine Clearance: 46.2 ml/min (by C-G formula based on Cr of 0.69).   Assessment: 67 yo with complex abdominal surgical history with several recent admissions is readmitted with BL PEs and possibly another bowel leak.  IV Heparin started 4/15am. Baseline INR = 1.28.   Heparin level now near therapeutic at 0.27 (goal 0.3-0.7) after many increases to infusion rate.     H/H:  Hgb slow trend down, 9.2 on admission, now 7.6. Plts ok.  Noted hemoptysis.   No other bleeding noted per RN.  Renal:  SCr stable, CrCl ~46 ml/min.   Heparin dosing weight = 42kg  Goal of Therapy:  Heparin level 0.3-0.7 units/ml Monitor platelets by anticoagulation protocol: Yes   Plan:   Increase heparin rate to 1550 units/hr = 15.5 ml/hr  Recheck heparin level in 6 hours  Daily HL/CBC   Haynes Hoehn, PharmD 08/06/2012 12:46 PM  Pager: [phone]

## 2012-08-06 NOTE — Progress Notes (Signed)
IR requested to see if abdominal fluid collection needs additional drainage. Dr. Fredia Sorrow has reviewed images...Marland KitchenMarland Kitcheninterval improvement in size of fluid collection when compared to previous CT.  Surgical drain is in adequate position. If additional drain is ultimately necessary, may have to be approached transhepatic, which would be a very high risk option given that she is on full dose heparin gtt. These findings and recommendations were d/w [id number] PA-C  Brayton El PA-C 08/06/2012 8:32 AM

## 2012-08-06 NOTE — Consult Note (Signed)
PULMONARY  / CRITICAL CARE MEDICINE  Name: Cynthia Bowen MRN: [id number] DOB: 07-27-1945    ADMISSION DATE:  08/04/2012 CONSULTATION DATE:  08/04/2012  REFERRING MD :  [facility]  CHIEF COMPLAINT:  Hemoptysis  BRIEF PATIENT DESCRIPTION:  67 yo female former smoker admitted with chest/abdominal pain and dyspnea.  She was found to have PE.  She was in hospital from 3/17 to 4/05 with gastric perforation with peritonitis/abscess after repair of incarcerated epigastric hernia.  She developed hemoptysis 4/16 and PCCM consulted.  PMHx of COPD, GERD, Depression, Fibromyalgia  SIGNIFICANT EVENTS: 4/15 CCS consulted for leaking around gastrostomy tube  STUDIES:  4/14 CT chest >> large PE LLL with pulmonary infarction, diffuse emphysema 4/14 CT abd/pelvis >> possible gastrostomy leak 4/15 Doppler upper & lower extremities >> no DVT  LINES / TUBES: 3/24 Rt PICC >>   CULTURES: 4/14 Urine >> negative 4/14 Abd wound >> 4/14 Blood >>   ANTIBIOTICS: 4/14 Zosyn >>   HISTORY OF PRESENT ILLNESS:   Pt confused at present, and unable to give information.  History obtained from medical record.  Pt was treated for gastric perforation, and d/c home on 4/05.  She developed chest pain and dyspnea.  She returned to ED and was found to have large LLL PE with infarct on CT chest.  She was started on heparin gtt.  She had one episode of hemoptysis this morning, and PCCM consulted.  PAST MEDICAL HISTORY :  Past Medical History  Diagnosis Date  . COPD (chronic obstructive pulmonary disease)   . Pneumonia 12-2011  . GERD (gastroesophageal reflux disease)   . Headache   . Arthritis     osteoarthritis  . Allergy   . Depression   . Neuromuscular disorder   . Osteoporosis   . Bronchitis     CURRENTLY AS OF 06/30/12 - HAS COUGH AND FINISHED ANTIBIOTIC FOR BRONCHITIS  . Fibromyalgia   . Pain     ABDOMINAL PAIN AND NAUSEA  . Pain     SOMETIMES PAIN RIGHT EAR AND NECK--STATES CAUSED BY A "LUMP" ON BACK OF  EAR--USES KENALOG CREAM TOPICALLY AS NEEDED.   Past Surgical History  Procedure Laterality Date  . Abdominal hysterectomy    . Esophagogastroduodenoscopy  04/18/2012    Procedure: ESOPHAGOGASTRODUODENOSCOPY (EGD);  Surgeon: [name], MD;  Location: [facility];  Service: Endoscopy;  Laterality: N/A;  . Eus  05/29/2012    Procedure: UPPER ENDOSCOPIC ULTRASOUND (EUS) LINEAR;  Surgeon: [name], MD;  Location: [facility];  Service: Endoscopy;  Laterality: N/A;  . Appendectomy    . Spine surgery      CERVICAL SPINE SURGERY X 2 - INCLUDING FUSION; LUMBAR SURGERY FOR RUPTURED DISC  . Eye surgery      BILATERAL CATARACT EXTRACTIONS  . Laparoscopic abdominal exploration N/A 07/02/2012    Procedure: converted to laparotomy with gastric biopsy;  Surgeon: [name]. [name], MD;  Location: [facility];  Service: General;  Laterality: N/A;  Laparoscopic Gastric Biospy attempted.   . Laparotomy N/A 07/07/2012    Procedure: EXPLORATORY LAPAROTOMY repair of gastric perforation with omental graham patch, drainage of abdominal abcess;  Surgeon: [name]. [name], MD;  Location: [facility];  Service: General;  Laterality: N/A;  . Stomach surgery  07/07/2012    Omental patch of gastric perforation  . Laparotomy N/A 07/13/2012    Procedure: EXPLORATORY LAPAROTOMY repair gastric leak;  Surgeon: Mariella Saa, MD;  Location: [facility];  Service: General;  Laterality: N/A;  Prior to Admission medications   Medication Sig Start Date End Date Taking? Authorizing Provider  albuterol (PROVENTIL HFA;VENTOLIN HFA) 108 (90 BASE) MCG/ACT inhaler Inhale 2 puffs into the lungs every 4 (four) hours as needed for wheezing (cough, shortness of breath or wheezing.). 06/30/12  Yes Phillips Odor, MD  dextromethorphan-[facility]FEN[facility]in [facility] DM) 30-600 MG per 12 hr tablet Take 1 tablet by mouth every 12 (twelve) hours.   Yes Historical Provider, MD  diphenhydrAMINE (BENADRYL) 25 mg capsule Take 25-50 mg by mouth every 6 (six)  hours as needed. Itching   Yes Historical Provider, MD  FLUoxetine (PROZAC) 20 MG capsule Take 20 mg by mouth daily before breakfast.   Yes Historical Provider, MD  HYDROcodone-acetaminophen (HYCET) 7.5-325 mg/15 ml solution Take 10 mLs by mouth every 4 (four) hours as needed. 07/26/12  Yes Velora Heckler, MD  Multiple Vitamin (MULTIVITAMIN) tablet Take 1 tablet by mouth daily.   Yes Historical Provider, MD  ondansetron (ZOFRAN-ODT) 8 MG disintegrating tablet Take 1 tablet (8 mg total) by mouth every 8 (eight) hours as needed. For nausea 05/23/12  Yes Tonye Pearson, MD  pantoprazole (PROTONIX) 40 MG tablet Take 40 mg by mouth daily.   Yes Historical Provider, MD  promethazine (PHENERGAN) 12.5 MG tablet Take 1 tablet (12.5 mg total) by mouth every 6 (six) hours as needed for nausea. 07/04/12  Yes [name]. Tsuei, MD  ranitidine (ZANTAC) 150 MG tablet Take 150 mg by mouth 2 (two) times daily.   Yes Historical Provider, MD  EPINEPHrine (EPIPEN) 0.3 mg/0.3 mL DEVI Inject 0.3 mLs (0.3 mg total) into the muscle once. 04/22/12   Gwenlyn Found Copland, MD  fentaNYL (DURAGESIC - DOSED MCG/HR) 50 MCG/HR Place 5 patches (250 mcg total) onto the skin every 3 (three) days. 07/26/12   Velora Heckler, MD  triamcinolone cream (KENALOG) 0.1 % Apply 1 application topically as needed (as needed for right ear pain). 05/05/12   Pearline Cables, MD   Allergies  Allergen Reactions  . Avelox (Moxifloxacin Hcl In Nacl) Nausea And Vomiting  . Alendronate Sodium Nausea And Vomiting  . Aspirin Nausea Only  . Codeine Nausea And Vomiting  . Doxycycline   . Fluconazole   . Hydrocodone Nausea And Vomiting    GI distress  . Neurontin (Gabapentin) Other (See Comments)    Mood changes   . Nsaids Other (See Comments)    Severe gastritis & perforation - avoid NSAIDs when possible  . Sertraline Hcl Other (See Comments)    Hallucinations   . Sulfa Antibiotics Rash    FAMILY HISTORY:  Family History  Problem Relation Age of  Onset  . COPD Mother   . Hypertension Maternal Grandmother    SOCIAL HISTORY:  reports that she quit smoking about 7 years ago. Her smoking use included Cigarettes. She started smoking about 9 years ago. She has a 17.5 pack-year smoking history. She has never used smokeless tobacco. She reports that she does not drink alcohol or use illicit drugs.  REVIEW OF SYSTEMS:   Unable to obtain.  SUBJECTIVE:   VITAL SIGNS: Temp:  [97.9 F (36.6 C)-99.5 F (37.5 C)] 99.5 F (37.5 C) (04/16 0800) Pulse Rate:  [73-90] 73 (04/15 1919) Resp:  [12-24] 17 (04/16 0617) BP: (85-115)/(48-73) 100/53 mmHg (04/16 0617) SpO2:  [93 %-100 %] 93 % (04/16 0617) Weight:  [91 lb 14.9 oz (41.7 kg)] 91 lb 14.9 oz (41.7 kg) (04/16 0445) Room air  INTAKE / OUTPUT: Intake/Output  04/15 0701 - 04/16 0700 04/16 0701 - 04/17 0700   I.V. (mL/kg) 842 (20.2) 23 (0.6)   IV Piggyback 262.5 12.5   TPN 1300 100   Total Intake(mL/kg) 2404.5 (57.7) 135.5 (3.2)   Urine (mL/kg/hr) 475 (0.5)    Drains 70 (0.1)    Total Output 545     Net +1859.5 +135.5        Urine Occurrence 1 x      PHYSICAL EXAMINATION: General:  No distress Neuro:  Alert, confused, normal strength, follows commands intermittently HEENT:  No sinus tenderness, no LAN Cardiovascular:  s1s2 regular Lungs: decreased breath sounds, rales Lt base, no wheeze Abdomen:  Wound dressing cleans, non tender Musculoskeletal:  No edema Skin:  No rashes  LABS:  Recent Labs Lab 08/04/12 2320 08/05/12 0318 08/05/12 0447 08/05/12 1025 08/05/12 1556 08/06/12 0345  HGB 9.2*  --  8.8*  --   --  7.7*  WBC 28.8*  --  29.0*  --   --  19.1*  PLT 478*  --  503*  --   --  458*  NA 135  --  134*  --   --  137  K 3.8  --  3.7  --   --  3.7  CL 98  --  98  --   --  104  CO2 25  --  25  --   --  27  GLUCOSE 86  --  95  --   --  126*  BUN 21  --  18  --   --  23  CREATININE 0.77  --  0.62  --   --  0.69  CALCIUM 9.8  --  9.6  --   --  9.1  MG  --   --   1.7  --   --  2.0  PHOS  --   --  3.1  --   --  3.6  AST 17  --  23  --   --  16  ALT 13  --  17  --   --  16  ALKPHOS 157*  --  156*  --   --  134*  BILITOT 0.5  --  0.5  --   --  0.4  PROT 8.5*  --  8.0  --   --  7.0  ALBUMIN 2.3*  --  2.2*  --   --  1.7*  APTT  --  34  --   --   --   --   INR  --  1.28  --   --   --   --   LATICACIDVEN 1.4  --   --   --   --   --   TROPONINI <0.30  --  <0.30 <0.30 <0.30  --   PROBNP 160.9*  --   --   --   --   --     Recent Labs Lab 08/05/12 1213 08/05/12 1738 08/05/12 2336 08/06/12 0552  GLUCAP 97 90 134* 143*    Imaging: Ct Angio Chest Pe W/cm &/or Wo Cm  08/05/2012  *RADIOLOGY REPORT*  Clinical Data: Shortness of breath.  Sharp left chest pain.  CT ANGIOGRAPHY CHEST  Technique:  Multidetector CT imaging of the chest using the standard protocol during bolus administration of intravenous contrast. Multiplanar reconstructed images including MIPs were obtained and reviewed to evaluate the vascular anatomy.  Contrast: 80mL OMNIPAQUE IOHEXOL 350 MG/ML SOLN  Comparison: CT scan of the  chest dated 04/10/2012 and CT scan of the abdomen dated 07/13/2012  Findings: There is an extensive large pulmonary embolus in the pulmonary artery to the left lower lobe.  There is secondary extensive left lower lobe consolidation and interstitial accentuation which may represent pulmonary infarction. Tiny emboli in the proximal portion of the pulmonary artery to the left upper lobe.  This is superimposed on diffuse emphysema.  Heart size is normal. No hilar mediastinal adenopathy.  The right lung is clear except for some slight atelectasis at the right base.  There is a small chronic pericardial effusion.  No acute osseous abnormality.  The thoracic aorta is normal except for slight calcification. Origins of the brachiocephalic vessels are widely patent.  IMPRESSION:  1.  Large extensive pulmonary embolus in the left lower lobe pulmonary artery.  Smaller emboli in the left  upper lobe pulmonary artery. 2.  Possible infarction in the left lower lobe. 3.  Diffuse emphysema.   Original Report Authenticated By: Francene Boyers, M.D.    Ct Abdomen Pelvis W Contrast  08/05/2012  *RADIOLOGY REPORT*  Clinical Data: Abdominal pain.  Recent gastric perforation.  CT ABDOMEN AND PELVIS WITH CONTRAST  Technique:  Multidetector CT imaging of the abdomen and pelvis was performed following the standard protocol during bolus administration of intravenous contrast.  Contrast: 80mL OMNIPAQUE IOHEXOL 350 MG/ML SOLN  Comparison: CT scan dated 07/13/2012  Findings: Drain is present in the upper abdomen anterior to the antrum of the stomach.  There are a few small extraluminal air fluid collections at that site, slightly diminished. There is an area of what is probably residual contrast and a there are fluid collection posterior to the left lobe of the liver seen on image number 26 of series 7.  Thickening of the mucosa of the distal stomach is unchanged. There are a few extraluminal air bubbles anterior to the distal antrum.  I cannot exclude a recurrent perforation based on these images.  Liver, spleen, pancreas, adrenal glands, and kidneys appear stable. Bilateral renal cysts and right renal calculi are unchanged.  No dilated large or small bowel.  Stable 2.5 cm cyst on the right ovary.  IMPRESSION: Interval slight decrease in the fluid and air collections anterior to the stomach adjacent to the soft tissue drain. Persistent or recurrent contrast in the fluid collections anterior to the stomach.  This could represent a recurrent leak.  No other change.   Original Report Authenticated By: Francene Boyers, M.D.    Dg Chest Port 1 View  08/04/2012  *RADIOLOGY REPORT*  Clinical Data: Chest pain and cough.  PORTABLE CHEST - 1 VIEW  Comparison: Chest CT 07/13/2012.  Findings: The cardiac silhouette, mediastinal and hilar contours are stable.  Persistent left lower lobe process appears to be a combination of a  pleural effusion and atelectasis.  Minimal right basilar atelectasis also.  Slight increased interstitial markings could be due to bronchitis or interstitial pneumonitis.  The right PICC line tip is in the distal SVC.  The bony thorax is intact.  IMPRESSION: Persistent left lower lobe process. Possible bronchitis or interstitial pneumonitis.   Original Report Authenticated By: [name], M.D.      ASSESSMENT / PLAN:  PULMONARY A: Hemoptysis secondary to acute PE with pulmonary infarct. Hx of COPD with emphysema. P:   -Need to continue heparin gtt -albuterol prn -f/u CXR 4/17  CARDIOVASCULAR A: Low blood pressure. P:  -maintain even to positive fluid balance  RENAL A:  No issues. P:   -  monitor renal fx, urine outpt, electrolytes  GASTROINTESTINAL A:  S/p gastric perforation with concern for leak. Protein calorie malnutrition. P:   -per CCS -TNA per primary team and CCS  HEMATOLOGIC A:  Anemia. P:  -f/u CBC -transfuse for Hb < 7  INFECTIOUS A:  Peritonitis. LLL infiltrate on CXR/CT chest likely from pulmonary infarct and not HCAP. P:   -Continue Abx, antifungal per primary team and CCS -f/u culture results  ENDOCRINE A:  No issues. P:   -Monitor blood sugar on BMET  NEUROLOGIC A:  Acute encephalopathy. P:   -per primary team  Coralyn Helling, MD [facility] Pulmonary/Critical Care 08/06/2012, 10:20 AM Pager:  [phone] After 3pm call: [phone]

## 2012-08-06 NOTE — Progress Notes (Signed)
Patient ID: Cynthia Bowen, female   DOB: 1946/01/13, 67 y.o.   MRN: [id number] [facility] Progress Note:   * No surgery found *  Subjective: Mental status is clear Objective: Vital signs in last 24 hours: Temp:  [97.9 F (36.6 C)-99.5 F (37.5 C)] 99.5 F (37.5 C) (04/16 0800) Pulse Rate:  [73] 73 (04/15 1919) Resp:  [12-24] 17 (04/16 0617) BP: (85-115)/(48-73) 100/53 mmHg (04/16 0617) SpO2:  [93 %-100 %] 93 % (04/16 0617) Weight:  [91 lb 14.9 oz (41.7 kg)] 91 lb 14.9 oz (41.7 kg) (04/16 0445)  Intake/Output from previous day: 04/15 0701 - 04/16 0700 In: 2404.5 [I.V.:842; IV Piggyback:262.5; TPN:1300] Out: 545 [Urine:475; Drains:70] Intake/Output this shift: Total I/O In: 138.5 [I.V.:26; IV Piggyback:12.5; TPN:100] Out: -   Physical Exam: Work of breathing is better.  Abdominal pain is much better.  JP is now draining some dark bilious material.    Lab Results:  Results for orders placed during the hospital encounter of 08/04/12 (from the past 48 hour(s))  URINALYSIS, ROUTINE W REFLEX MICROSCOPIC     Status: Abnormal   Collection Time    08/04/12 11:03 PM      Result Value Range   Color, Urine YELLOW  YELLOW   APPearance CLOUDY (*) CLEAR   Specific Gravity, Urine 1.018  1.005 - 1.030   pH 5.5  5.0 - 8.0   Glucose, UA NEGATIVE  NEGATIVE mg/dL   Hgb urine dipstick TRACE (*) NEGATIVE   Bilirubin Urine NEGATIVE  NEGATIVE   Ketones, ur NEGATIVE  NEGATIVE mg/dL   Protein, ur NEGATIVE  NEGATIVE mg/dL   Urobilinogen, UA 1.0  0.0 - 1.0 mg/dL   Nitrite NEGATIVE  NEGATIVE   Leukocytes, UA NEGATIVE  NEGATIVE  URINE CULTURE     Status: None   Collection Time    08/04/12 11:03 PM      Result Value Range   Specimen Description URINE, CATHETERIZED     Special Requests NONE     Culture  Setup Time 08/05/2012 07:00     Colony Count NO GROWTH     Culture NO GROWTH     Report Status 08/06/2012 FINAL    URINE MICROSCOPIC-ADD ON     Status: None   Collection Time     08/04/12 11:03 PM      Result Value Range   Squamous Epithelial / LPF RARE  RARE   WBC, UA 0-2  <3 WBC/hpf   RBC / HPF 0-2  <3 RBC/hpf   Bacteria, UA RARE  RARE  D-DIMER, QUANTITATIVE     Status: Abnormal   Collection Time    08/04/12 11:20 PM      Result Value Range   D-Dimer, Quant 4.02 (*) 0.00 - 0.48 ug/mL-FEU   Comment:            AT THE INHOUSE ESTABLISHED CUTOFF     VALUE OF 0.48 ug/mL FEU,     THIS ASSAY HAS BEEN DOCUMENTED     IN THE LITERATURE TO HAVE     A SENSITIVITY AND NEGATIVE     PREDICTIVE VALUE OF AT LEAST     98 TO 99%.  THE TEST RESULT     SHOULD BE CORRELATED WITH     AN ASSESSMENT OF THE CLINICAL     PROBABILITY OF DVT / VTE.  CBC WITH DIFFERENTIAL     Status: Abnormal   Collection Time    08/04/12 11:20 PM  Result Value Range   WBC 28.8 (*) 4.0 - 10.5 K/uL   RBC 3.36 (*) 3.87 - 5.11 MIL/uL   Hemoglobin 9.2 (*) 12.0 - 15.0 g/dL   HCT 40.9 (*) 81.1 - 91.4 %   MCV 81.0  78.0 - 100.0 fL   MCH 27.4  26.0 - 34.0 pg   MCHC 33.8  30.0 - 36.0 g/dL   RDW 78.2  95.6 - 21.3 %   Platelets 478 (*) 150 - 400 K/uL   Neutrophils Relative 86 (*) 43 - 77 %   Lymphocytes Relative 7 (*) 12 - 46 %   Monocytes Relative 7  3 - 12 %   Eosinophils Relative 0  0 - 5 %   Basophils Relative 0  0 - 1 %   Neutro Abs 24.8 (*) 1.7 - 7.7 K/uL   Lymphs Abs 2.0  0.7 - 4.0 K/uL   Monocytes Absolute 2.0 (*) 0.1 - 1.0 K/uL   Eosinophils Absolute 0.0  0.0 - 0.7 K/uL   Basophils Absolute 0.0  0.0 - 0.1 K/uL   RBC Morphology TARGET CELLS     Comment: POLYCHROMASIA PRESENT  COMPREHENSIVE METABOLIC PANEL     Status: Abnormal   Collection Time    08/04/12 11:20 PM      Result Value Range   Sodium 135  135 - 145 mEq/L   Potassium 3.8  3.5 - 5.1 mEq/L   Chloride 98  96 - 112 mEq/L   CO2 25  19 - 32 mEq/L   Glucose, Bld 86  70 - 99 mg/dL   BUN 21  6 - 23 mg/dL   Creatinine, Ser 0.86  0.50 - 1.10 mg/dL   Calcium 9.8  8.4 - 57.8 mg/dL   Total Protein 8.5 (*) 6.0 - 8.3 g/dL    Albumin 2.3 (*) 3.5 - 5.2 g/dL   AST 17  0 - 37 U/L   ALT 13  0 - 35 U/L   Alkaline Phosphatase 157 (*) 39 - 117 U/L   Total Bilirubin 0.5  0.3 - 1.2 mg/dL   GFR calc non Af Amer 86 (*) >90 mL/min   GFR calc Af Amer >90  >90 mL/min   Comment:            The eGFR has been calculated     using the CKD EPI equation.     This calculation has not been     validated in all clinical     situations.     eGFR's persistently     <90 mL/min signify     possible Chronic Kidney Disease.  LIPASE, BLOOD     Status: None   Collection Time    08/04/12 11:20 PM      Result Value Range   Lipase 18  11 - 59 U/L  LACTIC ACID, PLASMA     Status: None   Collection Time    08/04/12 11:20 PM      Result Value Range   Lactic Acid, Venous 1.4  0.5 - 2.2 mmol/L  PRO B NATRIURETIC PEPTIDE     Status: Abnormal   Collection Time    08/04/12 11:20 PM      Result Value Range   Pro B Natriuretic peptide (BNP) 160.9 (*) 0 - 125 pg/mL  TROPONIN I     Status: None   Collection Time    08/04/12 11:20 PM      Result Value Range   Troponin I <0.30  <0.30  ng/mL   Comment:            Due to the release kinetics of cTnI,     a negative result within the first hours     of the onset of symptoms does not rule out     myocardial infarction with certainty.     If myocardial infarction is still suspected,     repeat the test at appropriate intervals.  APTT     Status: None   Collection Time    08/05/12  3:18 AM      Result Value Range   aPTT 34  24 - 37 seconds  PROTIME-INR     Status: Abnormal   Collection Time    08/05/12  3:18 AM      Result Value Range   Prothrombin Time 15.7 (*) 11.6 - 15.2 seconds   INR 1.28  0.00 - 1.49  CULTURE, BLOOD (ROUTINE X 2)     Status: None   Collection Time    08/05/12  4:25 AM      Result Value Range   Specimen Description BLOOD LEFT ARM     Special Requests BOTTLES DRAWN AEROBIC ONLY 1 CC     Culture  Setup Time 08/05/2012 08:32     Culture       Value:        BLOOD  CULTURE RECEIVED NO GROWTH TO DATE CULTURE WILL BE HELD FOR 5 DAYS BEFORE ISSUING A FINAL NEGATIVE REPORT   Report Status PENDING    CULTURE, BLOOD (ROUTINE X 2)     Status: None   Collection Time    08/05/12  4:30 AM      Result Value Range   Specimen Description BLOOD LEFT WRIST     Special Requests BOTTLES DRAWN AEROBIC ONLY 2CC     Culture  Setup Time 08/05/2012 08:32     Culture       Value:        BLOOD CULTURE RECEIVED NO GROWTH TO DATE CULTURE WILL BE HELD FOR 5 DAYS BEFORE ISSUING A FINAL NEGATIVE REPORT   Report Status PENDING    PREALBUMIN     Status: Abnormal   Collection Time    08/05/12  4:47 AM      Result Value Range   Prealbumin 6.9 (*) 17.0 - 34.0 mg/dL  MAGNESIUM     Status: None   Collection Time    08/05/12  4:47 AM      Result Value Range   Magnesium 1.7  1.5 - 2.5 mg/dL  PHOSPHORUS     Status: None   Collection Time    08/05/12  4:47 AM      Result Value Range   Phosphorus 3.1  2.3 - 4.6 mg/dL  CHOLESTEROL, TOTAL     Status: None   Collection Time    08/05/12  4:47 AM      Result Value Range   Cholesterol 101  0 - 200 mg/dL  TRIGLYCERIDES     Status: None   Collection Time    08/05/12  4:47 AM      Result Value Range   Triglycerides 122  <150 mg/dL  TROPONIN I     Status: None   Collection Time    08/05/12  4:47 AM      Result Value Range   Troponin I <0.30  <0.30 ng/mL   Comment:            Due to the release  kinetics of cTnI,     a negative result within the first hours     of the onset of symptoms does not rule out     myocardial infarction with certainty.     If myocardial infarction is still suspected,     repeat the test at appropriate intervals.  HEPATIC FUNCTION PANEL     Status: Abnormal   Collection Time    08/05/12  4:47 AM      Result Value Range   Total Protein 8.0  6.0 - 8.3 g/dL   Albumin 2.2 (*) 3.5 - 5.2 g/dL   AST 23  0 - 37 U/L   ALT 17  0 - 35 U/L   Alkaline Phosphatase 156 (*) 39 - 117 U/L   Total Bilirubin 0.5  0.3  - 1.2 mg/dL   Bilirubin, Direct 0.1  0.0 - 0.3 mg/dL   Indirect Bilirubin 0.4  0.3 - 0.9 mg/dL  BASIC METABOLIC PANEL     Status: Abnormal   Collection Time    08/05/12  4:47 AM      Result Value Range   Sodium 134 (*) 135 - 145 mEq/L   Potassium 3.7  3.5 - 5.1 mEq/L   Chloride 98  96 - 112 mEq/L   CO2 25  19 - 32 mEq/L   Glucose, Bld 95  70 - 99 mg/dL   BUN 18  6 - 23 mg/dL   Creatinine, Ser 1.61  0.50 - 1.10 mg/dL   Calcium 9.6  8.4 - 09.6 mg/dL   GFR calc non Af Amer >90  >90 mL/min   GFR calc Af Amer >90  >90 mL/min   Comment:            The eGFR has been calculated     using the CKD EPI equation.     This calculation has not been     validated in all clinical     situations.     eGFR's persistently     <90 mL/min signify     possible Chronic Kidney Disease.  CBC     Status: Abnormal   Collection Time    08/05/12  4:47 AM      Result Value Range   WBC 29.0 (*) 4.0 - 10.5 K/uL   RBC 3.21 (*) 3.87 - 5.11 MIL/uL   Hemoglobin 8.8 (*) 12.0 - 15.0 g/dL   HCT 04.5 (*) 40.9 - 81.1 %   MCV 79.4  78.0 - 100.0 fL   MCH 27.4  26.0 - 34.0 pg   MCHC 34.5  30.0 - 36.0 g/dL   RDW 91.4  78.2 - 95.6 %   Platelets 503 (*) 150 - 400 K/uL  GRAM STAIN     Status: None   Collection Time    08/05/12  8:50 AM      Result Value Range   Specimen Description ABDOMEN JP FLUID     Special Requests Normal     Gram Stain       Value: CYTOSPIN     WBC PRESENT,BOTH PMN AND MONONUCLEAR     YEAST     GRAM NEGATIVE RODS     GRAM POSITIVE COCCI IN CHAINS IN PAIRS   Report Status 08/05/2012 FINAL    WOUND CULTURE     Status: None   Collection Time    08/05/12  8:50 AM      Result Value Range   Specimen Description DRAINAGE JP     Special  Requests Normal     Gram Stain       Value: WBC PRESENT,BOTH PMN AND MONONUCLEAR     YEAST     GRAM NEGATIVE RODS     GRAM POSITIVE COCCI     IN PAIRS IN CHAINS Gram Stain Report Called to,Read Back By and Verified With: Gram Stain Report Called to,Read  Back By and Verified With: J CONRAD RN 1000 08/05/12 BY SHUEA Performed by [facility] CYTOSPIN SLIDE   Culture PENDING     Report Status PENDING    ANAEROBIC CULTURE     Status: None   Collection Time    08/05/12  8:50 AM      Result Value Range   Specimen Description DRAINAGE JP     Special Requests Normal     Gram Stain PENDING     Culture       Value: NO ANAEROBES ISOLATED; CULTURE IN PROGRESS FOR 5 DAYS   Report Status PENDING    TROPONIN I     Status: None   Collection Time    08/05/12 10:25 AM      Result Value Range   Troponin I <0.30  <0.30 ng/mL   Comment:            Due to the release kinetics of cTnI,     a negative result within the first hours     of the onset of symptoms does not rule out     myocardial infarction with certainty.     If myocardial infarction is still suspected,     repeat the test at appropriate intervals.  HEPARIN LEVEL (UNFRACTIONATED)     Status: Abnormal   Collection Time    08/05/12 12:05 PM      Result Value Range   Heparin Unfractionated <0.10 (*) 0.30 - 0.70 IU/mL   Comment:            IF HEPARIN RESULTS ARE BELOW     EXPECTED VALUES, AND PATIENT     DOSAGE HAS BEEN CONFIRMED,     SUGGEST FOLLOW UP TESTING     OF ANTITHROMBIN III LEVELS.     REPEATED TO VERIFY     SPECIMEN CHECKED FOR CLOTS  GLUCOSE, CAPILLARY     Status: None   Collection Time    08/05/12 12:13 PM      Result Value Range   Glucose-Capillary 97  70 - 99 mg/dL   Comment 1 Documented in Chart     Comment 2 Notify RN    TROPONIN I     Status: None   Collection Time    08/05/12  3:56 PM      Result Value Range   Troponin I <0.30  <0.30 ng/mL   Comment:            Due to the release kinetics of cTnI,     a negative result within the first hours     of the onset of symptoms does not rule out     myocardial infarction with certainty.     If myocardial infarction is still suspected,     repeat the test at appropriate intervals.  GLUCOSE, CAPILLARY      Status: None   Collection Time    08/05/12  5:38 PM      Result Value Range   Glucose-Capillary 90  70 - 99 mg/dL  HEPARIN LEVEL (UNFRACTIONATED)     Status: Abnormal   Collection Time  08/05/12  8:20 PM      Result Value Range   Heparin Unfractionated <0.10 (*) 0.30 - 0.70 IU/mL   Comment:            IF HEPARIN RESULTS ARE BELOW     EXPECTED VALUES, AND PATIENT     DOSAGE HAS BEEN CONFIRMED,     SUGGEST FOLLOW UP TESTING     OF ANTITHROMBIN III LEVELS.  GLUCOSE, CAPILLARY     Status: Abnormal   Collection Time    08/05/12 11:36 PM      Result Value Range   Glucose-Capillary 134 (*) 70 - 99 mg/dL   Comment 1 Documented in Chart     Comment 2 Notify RN    DIFFERENTIAL     Status: Abnormal   Collection Time    08/06/12  3:45 AM      Result Value Range   Neutrophils Relative 84 (*) 43 - 77 %   Neutro Abs 16.0 (*) 1.7 - 7.7 K/uL   Lymphocytes Relative 8 (*) 12 - 46 %   Lymphs Abs 1.4  0.7 - 4.0 K/uL   Monocytes Relative 8  3 - 12 %   Monocytes Absolute 1.5 (*) 0.1 - 1.0 K/uL   Eosinophils Relative 0  0 - 5 %   Eosinophils Absolute 0.1  0.0 - 0.7 K/uL   Basophils Relative 0  0 - 1 %   Basophils Absolute 0.0  0.0 - 0.1 K/uL  CBC     Status: Abnormal   Collection Time    08/06/12  3:45 AM      Result Value Range   WBC 19.1 (*) 4.0 - 10.5 K/uL   RBC 2.83 (*) 3.87 - 5.11 MIL/uL   Hemoglobin 7.7 (*) 12.0 - 15.0 g/dL   HCT 40.9 (*) 81.1 - 91.4 %   MCV 80.6  78.0 - 100.0 fL   MCH 27.2  26.0 - 34.0 pg   MCHC 33.8  30.0 - 36.0 g/dL   RDW 78.2 (*) 95.6 - 21.3 %   Platelets 458 (*) 150 - 400 K/uL  COMPREHENSIVE METABOLIC PANEL     Status: Abnormal   Collection Time    08/06/12  3:45 AM      Result Value Range   Sodium 137  135 - 145 mEq/L   Potassium 3.7  3.5 - 5.1 mEq/L   Chloride 104  96 - 112 mEq/L   CO2 27  19 - 32 mEq/L   Glucose, Bld 126 (*) 70 - 99 mg/dL   BUN 23  6 - 23 mg/dL   Creatinine, Ser 0.86  0.50 - 1.10 mg/dL   Calcium 9.1  8.4 - 57.8 mg/dL   Total  Protein 7.0  6.0 - 8.3 g/dL   Albumin 1.7 (*) 3.5 - 5.2 g/dL   AST 16  0 - 37 U/L   ALT 16  0 - 35 U/L   Alkaline Phosphatase 134 (*) 39 - 117 U/L   Total Bilirubin 0.4  0.3 - 1.2 mg/dL   GFR calc non Af Amer 89 (*) >90 mL/min   GFR calc Af Amer >90  >90 mL/min   Comment:            The eGFR has been calculated     using the CKD EPI equation.     This calculation has not been     validated in all clinical     situations.     eGFR's persistently     <  90 mL/min signify     possible Chronic Kidney Disease.  MAGNESIUM     Status: None   Collection Time    08/06/12  3:45 AM      Result Value Range   Magnesium 2.0  1.5 - 2.5 mg/dL  HEPARIN LEVEL (UNFRACTIONATED)     Status: Abnormal   Collection Time    08/06/12  3:45 AM      Result Value Range   Heparin Unfractionated 0.10 (*) 0.30 - 0.70 IU/mL   Comment:            IF HEPARIN RESULTS ARE BELOW     EXPECTED VALUES, AND PATIENT     DOSAGE HAS BEEN CONFIRMED,     SUGGEST FOLLOW UP TESTING     OF ANTITHROMBIN III LEVELS.  PHOSPHORUS     Status: None   Collection Time    08/06/12  3:45 AM      Result Value Range   Phosphorus 3.6  2.3 - 4.6 mg/dL  GLUCOSE, CAPILLARY     Status: Abnormal   Collection Time    08/06/12  5:52 AM      Result Value Range   Glucose-Capillary 143 (*) 70 - 99 mg/dL  ABO/RH     Status: None   Collection Time    08/06/12  7:30 AM      Result Value Range   ABO/RH(D) B POS    TYPE AND SCREEN     Status: None   Collection Time    08/06/12  8:00 AM      Result Value Range   ABO/RH(D) B POS     Antibody Screen NEG     Sample Expiration 08/09/2012     Unit Number [id number]     Blood Component Type RBC LR PHER1     Unit division 00     Status of Unit ALLOCATED     Transfusion Status OK TO TRANSFUSE     Crossmatch Result Compatible     Unit Number [id number]     Blood Component Type RED CELLS,LR     Unit division 00     Status of Unit ALLOCATED     Transfusion Status OK TO TRANSFUSE      Crossmatch Result Compatible    VITAMIN B12     Status: None   Collection Time    08/06/12  8:00 AM      Result Value Range   Vitamin B-12 358  211 - 911 pg/mL  FOLATE     Status: None   Collection Time    08/06/12  8:00 AM      Result Value Range   Folate 5.8     Comment: (NOTE)     Reference Ranges            Deficient:       0.4 - 3.3 ng/mL            Indeterminate:   3.4 - 5.4 ng/mL            Normal:              > 5.4 ng/mL  FERRITIN     Status: Abnormal   Collection Time    08/06/12  8:00 AM      Result Value Range   Ferritin 371 (*) 10 - 291 ng/mL  RETICULOCYTES     Status: Abnormal   Collection Time    08/06/12  8:00 AM      Result  Value Range   Retic Ct Pct 2.3  0.4 - 3.1 %   RBC. 2.75 (*) 3.87 - 5.11 MIL/uL   Retic Count, Manual 63.3  19.0 - 186.0 K/uL    Radiology/Results: Ct Angio Chest Pe W/cm &/or Wo Cm  08/05/2012  *RADIOLOGY REPORT*  Clinical Data: Shortness of breath.  Sharp left chest pain.  CT ANGIOGRAPHY CHEST  Technique:  Multidetector CT imaging of the chest using the standard protocol during bolus administration of intravenous contrast. Multiplanar reconstructed images including MIPs were obtained and reviewed to evaluate the vascular anatomy.  Contrast: 80mL OMNIPAQUE IOHEXOL 350 MG/ML SOLN  Comparison: CT scan of the chest dated 04/10/2012 and CT scan of the abdomen dated 07/13/2012  Findings: There is an extensive large pulmonary embolus in the pulmonary artery to the left lower lobe.  There is secondary extensive left lower lobe consolidation and interstitial accentuation which may represent pulmonary infarction. Tiny emboli in the proximal portion of the pulmonary artery to the left upper lobe.  This is superimposed on diffuse emphysema.  Heart size is normal. No hilar mediastinal adenopathy.  The right lung is clear except for some slight atelectasis at the right base.  There is a small chronic pericardial effusion.  No acute osseous abnormality.  The  thoracic aorta is normal except for slight calcification. Origins of the brachiocephalic vessels are widely patent.  IMPRESSION:  1.  Large extensive pulmonary embolus in the left lower lobe pulmonary artery.  Smaller emboli in the left upper lobe pulmonary artery. 2.  Possible infarction in the left lower lobe. 3.  Diffuse emphysema.   Original Report Authenticated By: Francene Boyers, M.D.    Ct Abdomen Pelvis W Contrast  08/05/2012  *RADIOLOGY REPORT*  Clinical Data: Abdominal pain.  Recent gastric perforation.  CT ABDOMEN AND PELVIS WITH CONTRAST  Technique:  Multidetector CT imaging of the abdomen and pelvis was performed following the standard protocol during bolus administration of intravenous contrast.  Contrast: 80mL OMNIPAQUE IOHEXOL 350 MG/ML SOLN  Comparison: CT scan dated 07/13/2012  Findings: Drain is present in the upper abdomen anterior to the antrum of the stomach.  There are a few small extraluminal air fluid collections at that site, slightly diminished. There is an area of what is probably residual contrast and a there are fluid collection posterior to the left lobe of the liver seen on image number 26 of series 7.  Thickening of the mucosa of the distal stomach is unchanged. There are a few extraluminal air bubbles anterior to the distal antrum.  I cannot exclude a recurrent perforation based on these images.  Liver, spleen, pancreas, adrenal glands, and kidneys appear stable. Bilateral renal cysts and right renal calculi are unchanged.  No dilated large or small bowel.  Stable 2.5 cm cyst on the right ovary.  IMPRESSION: Interval slight decrease in the fluid and air collections anterior to the stomach adjacent to the soft tissue drain. Persistent or recurrent contrast in the fluid collections anterior to the stomach.  This could represent a recurrent leak.  No other change.   Original Report Authenticated By: Francene Boyers, M.D.    Dg Chest Port 1 View  08/04/2012  *RADIOLOGY REPORT*   Clinical Data: Chest pain and cough.  PORTABLE CHEST - 1 VIEW  Comparison: Chest CT 07/13/2012.  Findings: The cardiac silhouette, mediastinal and hilar contours are stable.  Persistent left lower lobe process appears to be a combination of a pleural effusion and atelectasis.  Minimal right basilar atelectasis  also.  Slight increased interstitial markings could be due to bronchitis or interstitial pneumonitis.  The right PICC line tip is in the distal SVC.  The bony thorax is intact.  IMPRESSION: Persistent left lower lobe process. Possible bronchitis or interstitial pneumonitis.   Original Report Authenticated By: [name], M.D.     Anti-infectives: Anti-infectives   Start     Dose/Rate Route Frequency Ordered Stop   08/05/12 1200  micafungin (MYCAMINE) 100 mg in sodium chloride 0.9 % 100 mL IVPB     100 mg 100 mL/hr over 1 Hours Intravenous Daily 08/05/12 1113     08/05/12 0500  piperacillin-tazobactam (ZOSYN) IVPB 3.375 g     3.375 g 12.5 mL/hr over 240 Minutes Intravenous 3 times per day 08/05/12 0408        Assessment/Plan: Problem List: Patient Active Problem List  Diagnosis  . Depression  . GERD (gastroesophageal reflux disease)  . Osteoarthritis  . Cervical neck pain with evidence of disc disease  . Fibromyalgia  . Community acquired pneumonia  . Back pain  . COPD (chronic obstructive pulmonary disease)  . Pericardial effusion  . Unspecified gastritis and gastroduodenitis without mention of hemorrhage  . Loss of weight  . Anorexia  . 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 [id number]  . 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 Protein-calorie malnutrition, severe  . PE (pulmonary embolism)  . Abdominal pain  . Hemoptysis    Continue NPO, TNA, Observation, heparin drip for PE * No surgery found *    LOS: 2 days   Matt B. Daphine Deutscher, MD, [facility] [phone]  beeper [phone]  08/06/2012 12:08 PM

## 2012-08-06 NOTE — Progress Notes (Addendum)
[facility]                                                                                Patient Demographics  Cynthia Bowen, is a 67 y.o. female, DOB - [birth year], RUE:[id number], JYN:[id number]  Admit date - 08/04/2012  Admitting Physician Eduard Clos, MD  Outpatient Primary MD for the patient is [name], [name], MD  LOS - 2   Chief Complaint  Patient presents with  . Chest Pain  . Abdominal Pain        Assessment & Plan   Summary   67 year old [place] American female who has had a complex abdominal surgical history in the last few months, according to old chart review and discussions with surgery patient had initial incarcerated epigastric hernia repair and 07/02/2012, this was complicated by gastric perforation with abscess/peritonitis, status post omental patch repair x2 in March of 2014, she required PICC line placement for TNA, she had an abdominal drain placed, and discharged on 07/26/2012 by general surgery, she came back on 08/05/2012 with chest and abdominal pain, in the ER workup was consistent of acute bilateral PEs she was placed on heparin drip, also she had leukocytosis and an abdominal CT was suggestive of possible another bowel leak. Hospitalist were requested to admit the patient, general surgery on consult.    I have requested Dr. Daphine Deutscher to consider transferring her care under general surgery service on 08/06/2012, her primary problem again continues to be postop surgical complication, patient was under surgery service for about 3 weeks few days ago, and this problem continues to be addressed by general surgery. Dr. Daphine Deutscher has graciously accepted her under his care, Hospitalist team will follow the patient throughout her Hospital stay. All PE and Medical issues will be addressed by out team.      1. Bilateral PE in the setting of decreased activity, dehydration, recent multiple abdominal surgeries. Patient also has evidence of new  bowel leak along with leukocytosis and may need another surgical procedure, for now I agree with IV heparin drip. Her upper and lower extremity Dopplers are negative, she has some blood in her phlegm right now but I think this is due to her underlying PE, blood pressure is slightly soft and will get normal saline bolus, has a PICC line. Have requested PCCM also to evaluate the patient. In case she develops hemoptysis we will have to stop anticoagulation and consider IVC filter.      2. Abdominal pain with recent 3 abdominal surgeries in March of 2014 by general surgery including incarcerated epigastric hernia repair, post biopsy gastric perforation, abdominal abscess and peritonitis of omental patch repair all in March of 2014. For now Zosyn to be continued, new CT abdomen showing possible bowel leak.   Continue bowel rest, IV Zosyn, IV micafungin as discussed with Dr. Orvan Falconer infectious disease as she is allergic to fluconazole, bowel rest for TNA . General surgery following the patient closely, IR unable to place a new drain, will defer further management of this issue to [facility].       3. Leukocytosis reactionary due to #2 above. Monitor clinically.     4. COPD currently at baseline  exam not consistent with any exacerbation as needed oxygen and nebulizer treatments.     5. Protein calorie malnutrition. TNA per pharmacy     6. Service with mild hypotension. Continue TNA, normal saline bolus x1, monitor blood pressures closely in stepped-down.     7. Anemia. Has anemia of chronic disease, we'll check an anemia panel, there has been some fall due to dilution, also she has the some blood in her phlegm which will be closely monitored, type and screen, goal will be to keep her above 7.5 in the light of her hypotension. Will monitor H&H closely. See discussion in #1 above.     8.Mild delirium - due to acute illness, hospital setting, narcotics. No focal neurological deficits,  reorients after some verbal feedback, CT Head stable, reduced narcotics, delirium improved already, monitor.      Code Status: Full  Family Communication: patient  Disposition Plan: TBD   Procedures CT angio chest, CT Abd-Pelvis, previous PICC  Upper and lower extremity Dopplers negative  Consults  [facility]. ID Dr. Orvan Falconer over the phone, PCCM   DVT Prophylaxis    Heparin   Lab Results  Component Value Date   PLT 458* 08/06/2012    Medications  Scheduled Meds: . chlorhexidine  15 mL Mouth Rinse BID  . fentaNYL  250 mcg Transdermal Q72H  . insulin aspart  0-9 Units Subcutaneous Q6H  . micafungin (MYCAMINE) IV  100 mg Intravenous Daily  . pantoprazole (PROTONIX) IV  40 mg Intravenous Q24H  . piperacillin-tazobactam (ZOSYN)  IV  3.375 g Intravenous Q8H  . sodium chloride  500 mL Intravenous Once  . sodium chloride  3 mL Intravenous Q12H   Continuous Infusions: . Marland KitchenTPN (CLINIMIX-E) Adult 50 mL/hr at 08/05/12 1746  . sodium chloride 20 mL/hr at 08/05/12 1742  . heparin 1,500 Units/hr (08/06/12 0800)   PRN Meds:.acetaminophen, acetaminophen, albuterol, diphenhydrAMINE, morphine injection, ondansetron (ZOFRAN) IV, ondansetron  Antibiotics     Anti-infectives   Start     Dose/Rate Route Frequency Ordered Stop   08/05/12 1200  micafungin (MYCAMINE) 100 mg in sodium chloride 0.9 % 100 mL IVPB     100 mg 100 mL/hr over 1 Hours Intravenous Daily 08/05/12 1113     08/05/12 0500  piperacillin-tazobactam (ZOSYN) IVPB 3.375 g     3.375 g 12.5 mL/hr over 240 Minutes Intravenous 3 times per day 08/05/12 0408         Time Spent in minutes   45   Maryam Feely K M.D on 08/06/2012 at 9:53 AM  Between 7am to 7pm - Pager - [phone]  After 7pm go to [email or web address] - password [email or web address]  And look for the night coverage person covering for me after hours  [facility] Office  [phone]    Subjective:   [name] today has, No headache, No chest pain No SOB  now , + abdominal pain  - No Nausea, No new weakness tingling or numbness, No Cough .   Objective:   Filed Vitals:   08/06/12 0356 08/06/12 0445 08/06/12 0617 08/06/12 0800  BP: 89/52  100/53   Pulse:      Temp: 98.1 F (36.7 C)   99.5 F (37.5 C)  TempSrc: Oral   Oral  Resp: 22  17   Height:      Weight:  41.7 kg (91 lb 14.9 oz)    SpO2: 96%  93%     Wt Readings from Last 3 Encounters:  08/06/12 41.7 kg (91 lb  14.9 oz)  07/14/12 44.3 kg (97 lb 10.6 oz)  07/14/12 44.3 kg (97 lb 10.6 oz)     Intake/Output Summary (Last 24 hours) at 08/06/12 0953 Last data filed at 08/06/12 0951  Gross per 24 hour  Intake 2460.5 ml  Output    545 ml  Net 1915.5 ml    Exam Awake , mild delirium, Oriented X 3, No new F.N deficits, Normal affect [place].AT,PERRAL Supple Neck,No JVD, No cervical lymphadenopathy appriciated.  Symmetrical Chest wall movement, Good air movement bilaterally, CTAB RRR,No Gallops,Rubs or new Murmurs, No Parasternal Heave Hypo active B.Sounds, Abd Soft but has mild generalized tenderness, Abd drain in place, No organomegaly appriciated, No rebound - guarding or rigidity.   No Cyanosis, Clubbing or edema, No new Rash or bruise     Data Review   Micro Results  Recent Results (from the past 240 hour(s))  URINE CULTURE     Status: None   Collection Time    08/04/12 11:03 PM      Result Value Range Status   Specimen Description URINE, CATHETERIZED   Final   Special Requests NONE   Final   Culture  Setup Time 08/05/2012 07:00   Final   Colony Count NO GROWTH   Final   Culture NO GROWTH   Final   Report Status 08/06/2012 FINAL   Final  CULTURE, BLOOD (ROUTINE X 2)     Status: None   Collection Time    08/05/12  4:25 AM      Result Value Range Status   Specimen Description BLOOD LEFT ARM   Final   Special Requests BOTTLES DRAWN AEROBIC ONLY 1 CC   Final   Culture  Setup Time 08/05/2012 08:32   Final   Culture     Final   Value:        BLOOD CULTURE RECEIVED NO  GROWTH TO DATE CULTURE WILL BE HELD FOR 5 DAYS BEFORE ISSUING A FINAL NEGATIVE REPORT   Report Status PENDING   Incomplete  CULTURE, BLOOD (ROUTINE X 2)     Status: None   Collection Time    08/05/12  4:30 AM      Result Value Range Status   Specimen Description BLOOD LEFT WRIST   Final   Special Requests BOTTLES DRAWN AEROBIC ONLY 2CC   Final   Culture  Setup Time 08/05/2012 08:32   Final   Culture     Final   Value:        BLOOD CULTURE RECEIVED NO GROWTH TO DATE CULTURE WILL BE HELD FOR 5 DAYS BEFORE ISSUING A FINAL NEGATIVE REPORT   Report Status PENDING   Incomplete  GRAM STAIN     Status: None   Collection Time    08/05/12  8:50 AM      Result Value Range Status   Specimen Description ABDOMEN JP FLUID   Final   Special Requests Normal   Final   Gram Stain     Final   Value: CYTOSPIN     WBC PRESENT,BOTH PMN AND MONONUCLEAR     YEAST     GRAM NEGATIVE RODS     GRAM POSITIVE COCCI IN CHAINS IN PAIRS   Report Status 08/05/2012 FINAL   Final  WOUND CULTURE     Status: None   Collection Time    08/05/12  8:50 AM      Result Value Range Status   Specimen Description DRAINAGE JP   Final   Special  Requests Normal   Final   Gram Stain     Final   Value: WBC PRESENT,BOTH PMN AND MONONUCLEAR     YEAST     GRAM NEGATIVE RODS     GRAM POSITIVE COCCI     IN PAIRS IN CHAINS Gram Stain Report Called to,Read Back By and Verified With: Gram Stain Report Called to,Read Back By and Verified With: J CONRAD RN 1000 08/05/12 BY SHUEA Performed by [facility] CYTOSPIN SLIDE   Culture PENDING   Incomplete   Report Status PENDING   Incomplete       Ct Angio Chest Pe W/cm &/or Wo Cm  08/05/2012  *RADIOLOGY REPORT*  Clinical Data: Shortness of breath.  Sharp left chest pain.  CT ANGIOGRAPHY CHEST  Technique:  Multidetector CT imaging of the chest using the standard protocol during bolus administration of intravenous contrast. Multiplanar reconstructed images including MIPs were obtained  and reviewed to evaluate the vascular anatomy.  Contrast: 80mL OMNIPAQUE IOHEXOL 350 MG/ML SOLN  Comparison: CT scan of the chest dated 04/10/2012 and CT scan of the abdomen dated 07/13/2012  Findings: There is an extensive large pulmonary embolus in the pulmonary artery to the left lower lobe.  There is secondary extensive left lower lobe consolidation and interstitial accentuation which may represent pulmonary infarction. Tiny emboli in the proximal portion of the pulmonary artery to the left upper lobe.  This is superimposed on diffuse emphysema.  Heart size is normal. No hilar mediastinal adenopathy.  The right lung is clear except for some slight atelectasis at the right base.  There is a small chronic pericardial effusion.  No acute osseous abnormality.  The thoracic aorta is normal except for slight calcification. Origins of the brachiocephalic vessels are widely patent.  IMPRESSION:  1.  Large extensive pulmonary embolus in the left lower lobe pulmonary artery.  Smaller emboli in the left upper lobe pulmonary artery. 2.  Possible infarction in the left lower lobe. 3.  Diffuse emphysema.   Original Report Authenticated By: Francene Boyers, M.D.    Ct Abdomen Pelvis W Contrast  08/05/2012  *RADIOLOGY REPORT*  Clinical Data: Abdominal pain.  Recent gastric perforation.  CT ABDOMEN AND PELVIS WITH CONTRAST  Technique:  Multidetector CT imaging of the abdomen and pelvis was performed following the standard protocol during bolus administration of intravenous contrast.  Contrast: 80mL OMNIPAQUE IOHEXOL 350 MG/ML SOLN  Comparison: CT scan dated 07/13/2012  Findings: Drain is present in the upper abdomen anterior to the antrum of the stomach.  There are a few small extraluminal air fluid collections at that site, slightly diminished. There is an area of what is probably residual contrast and a there are fluid collection posterior to the left lobe of the liver seen on image number 26 of series 7.  Thickening of the  mucosa of the distal stomach is unchanged. There are a few extraluminal air bubbles anterior to the distal antrum.  I cannot exclude a recurrent perforation based on these images.  Liver, spleen, pancreas, adrenal glands, and kidneys appear stable. Bilateral renal cysts and right renal calculi are unchanged.  No dilated large or small bowel.  Stable 2.5 cm cyst on the right ovary.  IMPRESSION: Interval slight decrease in the fluid and air collections anterior to the stomach adjacent to the soft tissue drain. Persistent or recurrent contrast in the fluid collections anterior to the stomach.  This could represent a recurrent leak.  No other change.   Original Report Authenticated By:  Francene Boyers, M.D.          Dg Chest Port 1 View  08/04/2012  *RADIOLOGY REPORT*  Clinical Data: Chest pain and cough.  PORTABLE CHEST - 1 VIEW  Comparison: Chest CT 07/13/2012.  Findings: The cardiac silhouette, mediastinal and hilar contours are stable.  Persistent left lower lobe process appears to be a combination of a pleural effusion and atelectasis.  Minimal right basilar atelectasis also.  Slight increased interstitial markings could be due to bronchitis or interstitial pneumonitis.  The right PICC line tip is in the distal SVC.  The bony thorax is intact.  IMPRESSION: Persistent left lower lobe process. Possible bronchitis or interstitial pneumonitis.   Original Report Authenticated By: [name], M.D.       CBC  Recent Labs Lab 08/04/12 2320 08/05/12 0447 08/06/12 0345  WBC 28.8* 29.0* 19.1*  HGB 9.2* 8.8* 7.7*  HCT 27.2* 25.5* 22.8*  PLT 478* 503* 458*  MCV 81.0 79.4 80.6  MCH 27.4 27.4 27.2  MCHC 33.8 34.5 33.8  RDW 15.5 15.3 15.7*  LYMPHSABS 2.0  --  1.4  MONOABS 2.0*  --  1.5*  EOSABS 0.0  --  0.1  BASOSABS 0.0  --  0.0    Chemistries   Recent Labs Lab 08/04/12 2320 08/05/12 0447 08/06/12 0345  NA 135 134* 137  K 3.8 3.7 3.7  CL 98 98 104  CO2 25 25 27   GLUCOSE 86 95 126*  BUN  21 18 23   CREATININE 0.77 0.62 0.69  CALCIUM 9.8 9.6 9.1  MG  --  1.7 2.0  AST 17 23 16   ALT 13 17 16   ALKPHOS 157* 156* 134*  BILITOT 0.5 0.5 0.4   ------------------------------------------------------------------------------------------------------------------ estimated creatinine clearance is 46.2 ml/min (by C-G formula based on Cr of 0.69). ------------------------------------------------------------------------------------------------------------------ No results found for this basename: HGBA1C,  in the last 72 hours ------------------------------------------------------------------------------------------------------------------  Recent Labs  08/05/12 0447  CHOL 101  TRIG 122   ------------------------------------------------------------------------------------------------------------------ No results found for this basename: TSH, T4TOTAL, FREET3, T3FREE, THYROIDAB,  in the last 72 hours ------------------------------------------------------------------------------------------------------------------  Recent Labs  08/06/12 0800  RETICCTPCT 2.3    Coagulation profile  Recent Labs Lab 08/05/12 0318  INR 1.28     Recent Labs  08/04/12 2320  DDIMER 4.02*    Cardiac Enzymes  Recent Labs Lab 08/05/12 0447 08/05/12 1025 08/05/12 1556  TROPONINI <0.30 <0.30 <0.30   ------------------------------------------------------------------------------------------------------------------ No components found with this basename: POCBNP,

## 2012-08-06 NOTE — Progress Notes (Signed)
PARENTERAL NUTRITION CONSULT NOTE - Follow-up  Pharmacy Consult for TNA Indication: Recurrent gastric perforation, prolonged NPO status  Allergies  Allergen Reactions  . Avelox (Moxifloxacin Hcl In Nacl) Nausea And Vomiting  . Alendronate Sodium Nausea And Vomiting  . Aspirin Nausea Only  . Codeine Nausea And Vomiting  . Doxycycline   . Fluconazole   . Hydrocodone Nausea And Vomiting    GI distress  . Neurontin (Gabapentin) Other (See Comments)    Mood changes   . Nsaids Other (See Comments)    Severe gastritis & perforation - avoid NSAIDs when possible  . Sertraline Hcl Other (See Comments)    Hallucinations   . Sulfa Antibiotics Rash    Patient Measurements: Height: 5\' 5"  (165.1 cm) Weight: 91 lb 14.9 oz (41.7 kg) IBW/kg (Calculated) : 57 Usual Weight: ~44kg  Vital Signs: Temp: 99.5 F (37.5 C) (04/16 0800) Temp src: Oral (04/16 0800) BP: 100/53 mmHg (04/16 0617) Intake/Output from previous day: 04/15 0701 - 04/16 0700 In: 2404.5 [I.V.:842; IV Piggyback:262.5; TPN:1300] Out: 545 [Urine:475; Drains:70] Intake/Output from this shift: Total I/O In: 138.5 [I.V.:26; IV Piggyback:12.5; TPN:100] Out: -   Labs:  Recent Labs  08/04/12 2320 08/05/12 0318 08/05/12 0447 08/06/12 0345  WBC 28.8*  --  29.0* 19.1*  HGB 9.2*  --  8.8* 7.7*  HCT 27.2*  --  25.5* 22.8*  PLT 478*  --  503* 458*  APTT  --  34  --   --   INR  --  1.28  --   --      Recent Labs  08/04/12 2320 08/05/12 0447 08/06/12 0345  NA 135 134* 137  K 3.8 3.7 3.7  CL 98 98 104  CO2 25 25 27   GLUCOSE 86 95 126*  BUN 21 18 23   CREATININE 0.77 0.62 0.69  CALCIUM 9.8 9.6 9.1  MG  --  1.7 2.0  PHOS  --  3.1 3.6  PROT 8.5* 8.0 7.0  ALBUMIN 2.3* 2.2* 1.7*  AST 17 23 16   ALT 13 17 16   ALKPHOS 157* 156* 134*  BILITOT 0.5 0.5 0.4  BILIDIR  --  0.1  --   IBILI  --  0.4  --   PREALBUMIN  --  6.9*  --   TRIG  --  122  --   CHOL  --  101  --   Corrected Ca 10.66 (3/25) Estimated Creatinine  Clearance: 46.2 ml/min (by C-G formula based on Cr of 0.69).    Medications:  Scheduled:  . chlorhexidine  15 mL Mouth Rinse BID  . fentaNYL  250 mcg Transdermal Q72H  . [COMPLETED] heparin  1,250 Units Intravenous Once  . [COMPLETED] heparin  1,250 Units Intravenous Once  . [COMPLETED] heparin  1,500 Units Intravenous Once  . insulin aspart  0-9 Units Subcutaneous Q6H  . micafungin (MYCAMINE) IV  100 mg Intravenous Daily  . pantoprazole (PROTONIX) IV  40 mg Intravenous Q24H  . piperacillin-tazobactam (ZOSYN)  IV  3.375 g Intravenous Q8H  . sodium chloride  500 mL Intravenous Once  . sodium chloride  3 mL Intravenous Q12H  . [DISCONTINUED] famotidine  20 mg Oral BID  . [DISCONTINUED] FLUoxetine  20 mg Oral QAC breakfast  . [DISCONTINUED] pantoprazole  40 mg Oral Daily   Infusions:  . Marland KitchenTPN (CLINIMIX-E) Adult 50 mL/hr at 08/05/12 1746  . [EXPIRED] sodium chloride 75 mL/hr at 08/05/12 0800  . sodium chloride 20 mL/hr at 08/05/12 1742  . heparin 1,500 Units/hr (  08/06/12 0800)  . [DISCONTINUED] heparin 750 Units/hr (08/05/12 0800)  . [DISCONTINUED] heparin 900 Units/hr (08/05/12 1402)    Insulin Requirements in the past 24 hours:  2 units, on sensitive SSI q 6 hours  Current Nutrition:  Home TPN: Dex 312gm, protein 78g, lipids 50mg  Tu/th, over 18h infusion Previous admission patient received Clinimix E5/20 at 52ml/hr  IVF: NS at 15ml/hr  Nutritional Goals:  RD recs (4/15): Kcal: 1610-9604, Protein: 65-77 grams, Fluid: 1.8 L  Cyclic Clinimix  E5/15 over 18 hours provides 1398 kcal/day, 84 g protein (1193kcal T/Th/S/S, 1672 kcal MWF with lipids)  Assessment: 66 y/oF with several recent hospitalizations for epigastric hernia repair and gastric biopsy, complicated by gastric perforation with abscess/periotnitis s/p omental patch repair. Pt was started on TPN 3/24 and d/c'd on home TPN 07/26/12.  Pt readmitted with BL PEs and CT suggestive of possible recurrent leak. Surgery  consulted and is considering percutaneous drainage.  Pt now on IV heparin for PEs, empiric antibioitics, and TNA resumed 4/15.  Pt today states she is hungry.  No issues with TNA infusion per RN.    Glucose: CBGs WNL (90-143) Electrolytes:  All WNL, Corr Ca = 10.94 (sl elevated) Renal:  SCr stable, CrCl ~46 ml/min LFTs: AST/ALT WNL, albumin low TGs: 122 (4/15) Prealbumin: 6.9 on 4/15, 8.1 (3/31), <3 (3/25)  Plan: at 18:00 tonight  Continue cyclic clinimix E5/15 over 18hrs. Infusion rate = 43ml/hr x 1 hr, then 174ml/hr x 16 hours, then 72ml/hr x 1 hr, then off x 6 hours  Change to 24h infusion if becomes more appropriate during admission  Fat emulsion at 13.43ml/hr over 18h (MWF only due to ongoing shortage).   MVI/TE MWF d/t national backorder  Continue CBGs q6h, with sensitive scale SSI.  Change to q8h if tolerates cycle  TNA lab panels on Mondays & Thursdays.  CMP and Mg ordered daily by physician  Haynes Hoehn, PharmD 08/06/2012 10:42 AM  Pager: [phone]

## 2012-08-06 NOTE — Progress Notes (Signed)
Fentanyl patch removed per MD order. Will continue to moniotor the pt watch for signs of improvement of her current confusion.

## 2012-08-06 NOTE — Progress Notes (Signed)
Pt remains confused.  When I went to empty her JP drain, I found that pt had dumped the contents of the drain into a cup with oral care swabs that we had been using for mouth care. Moderate amound of green bile-like drainage found in cup and on dressing around JP. Cynthia Bowen

## 2012-08-06 NOTE — Progress Notes (Signed)
ANTICOAGULATION CONSULT NOTE - Follow-up Consult  Pharmacy Consult for Heparin Indication: pulmonary embolus  Allergies  Allergen Reactions  . Avelox (Moxifloxacin Hcl In Nacl) Nausea And Vomiting  . Alendronate Sodium Nausea And Vomiting  . Aspirin Nausea Only  . Codeine Nausea And Vomiting  . Doxycycline   . Fluconazole   . Hydrocodone Nausea And Vomiting    GI distress  . Neurontin (Gabapentin) Other (See Comments)    Mood changes   . Nsaids Other (See Comments)    Severe gastritis & perforation - avoid NSAIDs when possible  . Sertraline Hcl Other (See Comments)    Hallucinations   . Sulfa Antibiotics Rash    Patient Measurements: Height: 5\' 5"  (165.1 cm) Weight: 91 lb 14.9 oz (41.7 kg) IBW/kg (Calculated) : 57 Heparin dosing weight = 42kg  Vital Signs: Temp: 98.7 F (37.1 C) (04/16 1600) Temp src: Oral (04/16 1600) BP: 105/62 mmHg (04/16 1600)  Labs:  Recent Labs  08/04/12 2320 08/05/12 0318 08/05/12 0447 08/05/12 1025  08/05/12 1556 08/05/12 2020 08/06/12 0345 08/06/12 1205  HGB 9.2*  --  8.8*  --   --   --   --  7.7* 7.6*  HCT 27.2*  --  25.5*  --   --   --   --  22.8* 22.1*  PLT 478*  --  503*  --   --   --   --  458*  --   APTT  --  34  --   --   --   --   --   --   --   LABPROT  --  15.7*  --   --   --   --   --   --   --   INR  --  1.28  --   --   --   --   --   --   --   HEPARINUNFRC  --   --   --   --   < >  --  <0.10* 0.10* 0.27*  CREATININE 0.77  --  0.62  --   --   --   --  0.69  --   TROPONINI <0.30  --  <0.30 <0.30  --  <0.30  --   --   --   < > = values in this interval not displayed.  Estimated Creatinine Clearance: 46.2 ml/min (by C-G formula based on Cr of 0.69).   Assessment: 67 yo with complex abdominal surgical history with several recent admissions is readmitted with BL PEs and possibly another bowel leak.  IV Heparin started 4/15am.   Heparin level (0.22) has decreased to subtherapeutic level; infusion confirmed to be at 15.5  ml/hr.  Goal of Therapy:  Heparin level 0.3-0.7 units/ml Monitor platelets by anticoagulation protocol: Yes   Plan:  Bolus heparin 600 units IV x 1 Increase to heparin IV infusion at 1650 units/hr (16.5 ml/hr) Heparin level 6 hours after rate change Daily heparin level and CBC Continue to monitor H&H and platelets   Lynann Beaver PharmD, BCPS Pager [phone] 08/06/2012 7:59 PM

## 2012-08-06 NOTE — Progress Notes (Signed)
ANTICOAGULATION CONSULT NOTE - Follow-up Consult  Pharmacy Consult for Heparin Indication: pulmonary embolus  Allergies  Allergen Reactions  . Avelox (Moxifloxacin Hcl In Nacl) Nausea And Vomiting  . Alendronate Sodium Nausea And Vomiting  . Aspirin Nausea Only  . Codeine Nausea And Vomiting  . Doxycycline   . Fluconazole   . Hydrocodone Nausea And Vomiting    GI distress  . Neurontin (Gabapentin) Other (See Comments)    Mood changes   . Nsaids Other (See Comments)    Severe gastritis & perforation - avoid NSAIDs when possible  . Sertraline Hcl Other (See Comments)    Hallucinations   . Sulfa Antibiotics Rash    Patient Measurements: Height: 5\' 5"  (165.1 cm) Weight: 91 lb 14.9 oz (41.7 kg) IBW/kg (Calculated) : 57   Vital Signs: Temp: 97.9 F (36.6 C) (04/16 0000) Temp src: Oral (04/16 0000) BP: 89/52 mmHg (04/16 0356) Pulse Rate: 73 (04/15 1919)  Labs:  Recent Labs  08/04/12 2320 08/05/12 0318 08/05/12 0447 08/05/12 1025 08/05/12 1205 08/05/12 1556 08/05/12 2020 08/06/12 0345  HGB 9.2*  --  8.8*  --   --   --   --  7.7*  HCT 27.2*  --  25.5*  --   --   --   --  22.8*  PLT 478*  --  503*  --   --   --   --  458*  APTT  --  34  --   --   --   --   --   --   LABPROT  --  15.7*  --   --   --   --   --   --   INR  --  1.28  --   --   --   --   --   --   HEPARINUNFRC  --   --   --   --  <0.10*  --  <0.10* 0.10*  CREATININE 0.77  --  0.62  --   --   --   --  0.69  TROPONINI <0.30  --  <0.30 <0.30  --  <0.30  --   --     Estimated Creatinine Clearance: 46.2 ml/min (by C-G formula based on Cr of 0.69).   Assessment: 67 yo c/o chest and abdominal pain- found to have large PE in pulmonary artery to the LLL. Heparin started 4/15am. Baseline INR = 1.28  Heparin level undetectable x 2, 0.10 x1.  Patient is currently receiving IV heparin at 1200 units/hr (rated verified with RN)  CBC: hgb=7.7, plts=403  No bleeding noted per RN  Goal of Therapy:  Heparin  level 0.3-0.7 units/ml Monitor platelets by anticoagulation protocol: Yes   Plan:   Heparin 1500 unit bolus then increase heparin gtt rate to 1500 units/hr   Recheck heparin level  In 6 hours  Daily HL/CBC    Susanne Greenhouse R Pharmacy #: [id number]

## 2012-08-06 NOTE — Progress Notes (Signed)
RN paged NP Schorr about pt's Hgb level this am. No new orders received. VSS, no signs of overt bleeding. Will continue to monitor.   Cynthia Bowen

## 2012-08-07 ENCOUNTER — Inpatient Hospital Stay (HOSPITAL_COMMUNITY): Payer: Medicare Other

## 2012-08-07 DIAGNOSIS — I2699 Other pulmonary embolism without acute cor pulmonale: Secondary | ICD-10-CM

## 2012-08-07 DIAGNOSIS — R042 Hemoptysis: Secondary | ICD-10-CM

## 2012-08-07 DIAGNOSIS — E43 Unspecified severe protein-calorie malnutrition: Secondary | ICD-10-CM

## 2012-08-07 DIAGNOSIS — J449 Chronic obstructive pulmonary disease, unspecified: Secondary | ICD-10-CM

## 2012-08-07 LAB — CBC
HCT: 22 % — ABNORMAL LOW (ref 36.0–46.0)
MCH: 27.8 pg (ref 26.0–34.0)
MCV: 81.5 fL (ref 78.0–100.0)
Platelets: 437 10*3/uL — ABNORMAL HIGH (ref 150–400)
RDW: 15.9 % — ABNORMAL HIGH (ref 11.5–15.5)

## 2012-08-07 LAB — COMPREHENSIVE METABOLIC PANEL
ALT: 9 U/L (ref 0–35)
AST: 10 U/L (ref 0–37)
Alkaline Phosphatase: 110 U/L (ref 39–117)
CO2: 26 mEq/L (ref 19–32)
Chloride: 107 mEq/L (ref 96–112)
GFR calc Af Amer: >90 mL/min (ref >90)
GFR calc non Af Amer: >90 mL/min (ref >90)
Glucose, Bld: 119 mg/dL — ABNORMAL HIGH (ref 70–99)
Potassium: 3.1 mEq/L — ABNORMAL LOW (ref 3.5–5.1)
Sodium: 139 mEq/L (ref 135–145)
Total Bilirubin: 0.2 mg/dL — ABNORMAL LOW (ref 0.3–1.2)

## 2012-08-07 LAB — GLUCOSE, CAPILLARY
Glucose-Capillary: 117 mg/dL — ABNORMAL HIGH (ref 70–99)
Glucose-Capillary: 118 mg/dL — ABNORMAL HIGH (ref 70–99)

## 2012-08-07 MED ORDER — POTASSIUM CHLORIDE IN NACL 20-0.9 MEQ/L-% IV SOLN
INTRAVENOUS | Status: DC
  Administered 2012-08-07: 1000 mL via INTRAVENOUS
  Administered 2012-08-08: 06:00:00 via INTRAVENOUS
  Filled 2012-08-07 (×2): qty 1000

## 2012-08-07 MED ORDER — SODIUM CHLORIDE 0.9 % IV BOLUS (SEPSIS)
500.0000 mL | Freq: Once | INTRAVENOUS | Status: AC
  Administered 2012-08-07: 500 mL via INTRAVENOUS

## 2012-08-07 MED ORDER — MORPHINE SULFATE 2 MG/ML IJ SOLN
INTRAMUSCULAR | Status: AC
  Administered 2012-08-07: 2 mg via INTRAVENOUS
  Filled 2012-08-07: qty 1

## 2012-08-07 MED ORDER — CLINIMIX E/DEXTROSE (5/20) 5 % IV SOLN
INTRAVENOUS | Status: AC
  Administered 2012-08-07: 17:00:00 via INTRAVENOUS
  Filled 2012-08-07: qty 2000

## 2012-08-07 MED ORDER — MORPHINE SULFATE 2 MG/ML IJ SOLN
1.0000 mg | INTRAMUSCULAR | Status: DC | PRN
Start: 2012-08-07 — End: 2012-08-14
  Administered 2012-08-08 – 2012-08-14 (×44): 2 mg via INTRAVENOUS
  Filled 2012-08-07 (×44): qty 1

## 2012-08-07 MED ORDER — POTASSIUM CHLORIDE 10 MEQ/100ML IV SOLN: 10.0000 meq | INTRAVENOUS | Status: DC

## 2012-08-07 MED ORDER — POTASSIUM CHLORIDE 10 MEQ/50ML IV SOLN
10.0000 meq | INTRAVENOUS | Status: AC
  Administered 2012-08-07 (×4): 10 meq via INTRAVENOUS
  Filled 2012-08-07: qty 200

## 2012-08-07 MED ORDER — MAGNESIUM SULFATE IN D5W 10-5 MG/ML-% IV SOLN
1.0000 g | Freq: Once | INTRAVENOUS | Status: AC
  Administered 2012-08-07: 1 g via INTRAVENOUS
  Filled 2012-08-07: qty 100

## 2012-08-07 MED ORDER — POTASSIUM CHLORIDE 10 MEQ/100ML IV SOLN
10.0000 meq | INTRAVENOUS | Status: AC
  Administered 2012-08-07 (×4): 10 meq via INTRAVENOUS
  Filled 2012-08-07 (×4): qty 100

## 2012-08-07 NOTE — Progress Notes (Signed)
PARENTERAL NUTRITION CONSULT NOTE - Follow-up  Pharmacy Consult for TNA Indication: Recurrent gastric perforation, prolonged NPO status  Allergies  Allergen Reactions  . Avelox (Moxifloxacin Hcl In Nacl) Nausea And Vomiting  . Alendronate Sodium Nausea And Vomiting  . Aspirin Nausea Only  . Codeine Nausea And Vomiting  . Doxycycline   . Fluconazole   . Hydrocodone Nausea And Vomiting    GI distress  . Neurontin (Gabapentin) Other (See Comments)    Mood changes   . Nsaids Other (See Comments)    Severe gastritis & perforation - avoid NSAIDs when possible  . Sertraline Hcl Other (See Comments)    Hallucinations   . Sulfa Antibiotics Rash    Patient Measurements: Height: 5\' 5"  (165.1 cm) Weight: 97 lb 14.2 oz (44.4 kg) IBW/kg (Calculated) : 57 Usual Weight: ~44kg  Vital Signs: Temp: 97.7 F (36.5 C) (04/17 0800) Temp src: Oral (04/17 0800) BP: 106/54 mmHg (04/17 0800) Intake/Output from previous day: 04/16 0701 - 04/17 0700 In: 3879 [I.V.:727.5; IV Piggyback:1337.5; TPN:1794] Out: 1570 [Urine:1450; Drains:120] Intake/Output from this shift: Total I/O In: -  Out: 200 [Urine:200]  Labs:  Recent Labs  08/04/12 2320 08/05/12 0318 08/05/12 0447 08/06/12 0345 08/06/12 1205 08/07/12 0410  WBC 28.8*  --  29.0* 19.1*  --  17.8*  HGB 9.2*  --  8.8* 7.7* 7.6* 7.5*  HCT 27.2*  --  25.5* 22.8* 22.1* 22.0*  PLT 478*  --  503* 458*  --  437*  APTT  --  34  --   --   --   --   INR  --  1.28  --   --   --   --      Recent Labs  08/04/12 2320 08/05/12 0447 08/06/12 0345 08/07/12 0410  NA 135 134* 137 139  K 3.8 3.7 3.7 3.1*  CL 98 98 104 107  CO2 25 25 27 26   GLUCOSE 86 95 126* 119*  BUN 21 18 23 17   CREATININE 0.77 0.62 0.69 0.57  CALCIUM 9.8 9.6 9.1 8.6  MG  --  1.7 2.0 1.8  PHOS  --  3.1 3.6 3.2  PROT 8.5* 8.0 7.0 6.1  ALBUMIN 2.3* 2.2* 1.7* 1.5*  AST 17 23 16 10   ALT 13 17 16 9   ALKPHOS 157* 156* 134* 110  BILITOT 0.5 0.5 0.4 0.2*  BILIDIR  --  0.1   --   --   IBILI  --  0.4  --   --   PREALBUMIN  --  6.9*  --   --   TRIG  --  122  --   --   CHOL  --  101  --   --   Corrected Ca 10.66 (3/25) Estimated Creatinine Clearance: 49.1 ml/min (by C-G formula based on Cr of 0.57).    Medications:  Scheduled:  . chlorhexidine  15 mL Mouth Rinse BID  . [COMPLETED] heparin  600 Units Intravenous Once  . insulin aspart  0-9 Units Subcutaneous Q6H  . magnesium sulfate 1 - 4 g bolus IVPB  1 g Intravenous Once  . micafungin [facility]) IV  100 mg Intravenous Daily  . [COMPLETED]  morphine injection  4 mg Intravenous Once  . pantoprazole (PROTONIX) IV  40 mg Intravenous Q24H  . piperacillin-tazobactam (ZOSYN)  IV  3.375 g Intravenous Q8H  . potassium chloride  10 mEq Intravenous Q1 Hr x 4  . [COMPLETED] sodium chloride  500 mL Intravenous Once  . [COMPLETED]  sodium chloride  500 mL Intravenous Once  . [COMPLETED] sodium chloride  500 mL Intravenous Once  . sodium chloride  500 mL Intravenous Once  . sodium chloride  3 mL Intravenous Q12H  . [DISCONTINUED] fentaNYL  250 mcg Transdermal Q72H   Infusions:  . [EXPIRED] .TPN (CLINIMIX-E) Adult 50 mL/hr at 08/05/12 1746  . Marland KitchenTPN (CLINIMIX-E) Adult 100 mL/hr at 08/06/12 1900  . sodium chloride 20 mL/hr at 08/07/12 0409  . fat emulsion 250 mL (08/06/12 1729)  . heparin 1,650 Units/hr (08/06/12 2015)    Insulin Requirements in the past 24 hours:  1 units, on sensitive SSI q 6 hours  Current Nutrition:  PTA Home TPN: Dex 312gm, protein 78g, lipids 50mg  Tu/th, over 18h infusion Previous admission patient received Clinimix E5/20 at 49ml/hr Currently on Cyclic Clinimix  E5/15 over 18 hours  IVF: NS KVO  Nutritional Goals:  RD recs (4/15): Kcal: 9562-1308, Protein: 65-77 grams, Fluid: 1.8 L  Cyclic Clinimix  E5/15 over 18 hours provides 1398 kcal/day, 84 g protein (1193kcal T/Th/S/S, 1672 kcal MWF with lipids)   Assessment: 66 y/oF with several recent hospitalizations for epigastric  hernia repair and gastric biopsy, complicated by gastric perforation with abscess/periotinitis s/p omental patch repair. Pt was started on TPN 3/24 and d/c'd on home TPN 07/26/12.  Pt readmitted with BL PEs and CT suggestive of possible recurrent leak. Surgery consulted and is considering percutaneous drainage.  Pt now on IV heparin for PEs, empiric antibioitics, and TNA resumed 4/15.  No issues with TNA infusion per RN.  Will adjust TNA to continuous 24 hour infusion while inpatient.   Currently NPO.  Pt states she is hungry, wants to eat, still has chest and stomach pain.  Noted she may have blood transfusion today.   Glucose: CBGs WNL (104-121) Electrolytes:  K low @ 3.1 (replaced with 4 runs KCl ), other lytes WNL, Corr Ca ok.  Renal:  SCr stable LFTs: AST/ALT WNL, albumin low TGs: 122 (4/15) Prealbumin: 6.9 on 4/15, 8.1 (3/31), <3 (3/25)  Plan: at 18:00 tonight  Change to Clinimix E5/20 @ 65 ml/hr (provides avg 1579 Kcal/day, 78 g protein/day) which pt was receiving on previous admission  Fat emulsion MWF only due to ongoing shortage  MVI/TE MWF d/t national backorder  Replace potassium with 4 more runs tonight (will schedule later in the day to avoid timing issues with possible blood transfusion/EI zosyn/other IV medications)  Continue CBGs q6h, with sensitive scale SSI.  TNA lab panels on Mondays & Thursdays.  CMP and Mg ordered daily by physician  Haynes Hoehn, PharmD 08/07/2012 9:37 AM  Pager: [phone]

## 2012-08-07 NOTE — Progress Notes (Signed)
ANTICOAGULATION CONSULT NOTE - Follow-up Consult  Pharmacy Consult for Heparin Indication: pulmonary embolus  Allergies  Allergen Reactions  . Avelox (Moxifloxacin Hcl In Nacl) Nausea And Vomiting  . Alendronate Sodium Nausea And Vomiting  . Aspirin Nausea Only  . Codeine Nausea And Vomiting  . Doxycycline   . Fluconazole   . Hydrocodone Nausea And Vomiting    GI distress  . Neurontin (Gabapentin) Other (See Comments)    Mood changes   . Nsaids Other (See Comments)    Severe gastritis & perforation - avoid NSAIDs when possible  . Sertraline Hcl Other (See Comments)    Hallucinations   . Sulfa Antibiotics Rash    Patient Measurements: Height: 5\' 5"  (165.1 cm) Weight: 97 lb 14.2 oz (44.4 kg) IBW/kg (Calculated) : 57   Vital Signs: Temp: 97.7 F (36.5 C) (04/17 0800) Temp src: Oral (04/17 0800) BP: 110/58 mmHg (04/17 1000)  Labs:  Recent Labs  08/04/12 2320 08/05/12 0318 08/05/12 0447 08/05/12 1025  08/05/12 1556  08/06/12 0345 08/06/12 1205 08/06/12 1830 08/07/12 0220 08/07/12 0410 08/07/12 1010  HGB 9.2*  --  8.8*  --   --   --   --  7.7* 7.6*  --   --  7.5*  --   HCT 27.2*  --  25.5*  --   --   --   --  22.8* 22.1*  --   --  22.0*  --   PLT 478*  --  503*  --   --   --   --  458*  --   --   --  437*  --   APTT  --  34  --   --   --   --   --   --   --   --   --   --   --   LABPROT  --  15.7*  --   --   --   --   --   --   --   --   --   --   --   INR  --  1.28  --   --   --   --   --   --   --   --   --   --   --   HEPARINUNFRC  --   --   --   --   < >  --   < > 0.10* 0.27* 0.22* 0.48  --  0.50  CREATININE 0.77  --  0.62  --   --   --   --  0.69  --   --   --  0.57  --   TROPONINI <0.30  --  <0.30 <0.30  --  <0.30  --   --   --   --   --   --   --   < > = values in this interval not displayed.  Estimated Creatinine Clearance: 49.1 ml/min (by C-G formula based on Cr of 0.57).   Assessment: 67 yo with complex abdominal surgical history with several  recent admissions is readmitted with BL PEs and possibly another bowel leak.  IV Heparin started 4/15am. Baseline INR = 1.28.   Confirmation Heparin level now therapeutic = 0.50.    H/H:  Hgb slow trend down, 9.2 on admission, now 7.5. Plts ok.  Noted hemoptysis occasionally.   No other bleeding noted per RN and pt.  Pt may receive blood transfusion today.  Renal:  SCr stable  Spoke with Dr. Thedore Mins regarding long-term anticoagulation plans:  IV heparin to continue until surgery plans are decided; afterwards, most likely transition to LMWH.   Goal of Therapy:  Heparin level 0.3-0.7 units/ml Monitor platelets by anticoagulation protocol: Yes   Plan:   Continue IV heparin at current rate of 1650 units/hr =16.5 ml/hr.    Daily HL/CBC   Haynes Hoehn, PharmD 08/07/2012 11:40 AM  Pager: [phone]

## 2012-08-07 NOTE — Progress Notes (Signed)
PULMONARY  / CRITICAL CARE MEDICINE  Name: Cynthia Bowen MRN: [id number] DOB: 09-Feb-1946    ADMISSION DATE:  08/04/2012 CONSULTATION DATE:  08/04/2012  REFERRING MD :  [facility]  CHIEF COMPLAINT:  Hemoptysis  BRIEF PATIENT DESCRIPTION:  67 yo female former smoker admitted with chest/abdominal pain and dyspnea.  She was found to have PE.  She was in hospital from 3/17 to 4/05 with gastric perforation with peritonitis/abscess after repair of incarcerated epigastric hernia.  She developed hemoptysis 4/16 and PCCM consulted.  PMHx of COPD, GERD, Depression, Fibromyalgia  SIGNIFICANT EVENTS: 4/15 CCS consulted for leaking around gastrostomy tube  STUDIES:  4/14 CT chest >> large PE LLL with pulmonary infarction, diffuse emphysema 4/14 CT abd/pelvis >> possible gastrostomy leak 4/15 Doppler upper & lower extremities >> no DVT 4/16 CT head >> no acute findings  LINES / TUBES: 3/24 Rt PICC >>   CULTURES: 4/14 Urine >> negative 4/14 Abd wound >> 4/14 Blood >>   ANTIBIOTICS: 4/14 Zosyn >>   SUBJECTIVE:  Still cough up sputum.  Denies further hemoptysis.  Denies chest pain.  Breathing okay.  VITAL SIGNS: Temp:  [97.7 F (36.5 C)-99.6 F (37.6 C)] 97.7 F (36.5 C) (04/17 0800) Resp:  [13-38] 20 (04/17 0800) BP: (77-114)/(42-62) 106/54 mmHg (04/17 0800) SpO2:  [91 %-98 %] 94 % (04/17 0800) Weight:  [97 lb 14.2 oz (44.4 kg)] 97 lb 14.2 oz (44.4 kg) (04/17 0500) Room air  INTAKE / OUTPUT: Intake/Output     04/16 0701 - 04/17 0700 04/17 0701 - 04/18 0700   I.V. (mL/kg) 727.5 (16.4)    Other 20    IV Piggyback 1337.5    TPN 1794    Total Intake(mL/kg) 3879 (87.4)    Urine (mL/kg/hr) 1450 (1.4) 200 (2.1)   Drains 120 (0.1)    Total Output 1570 200   Net +2309 -200        Urine Occurrence 600 x      PHYSICAL EXAMINATION: General:  No distress Neuro:  Alert, follows commands HEENT:  No sinus tenderness, no LAN Cardiovascular:  s1s2 regular Lungs: decreased breath  sounds, no wheeze Abdomen:  Wound dressing cleans, non tender Musculoskeletal:  No edema Skin:  No rashes  LABS:  Recent Labs Lab 08/04/12 2320 08/05/12 0318 08/05/12 0447 08/05/12 1025 08/05/12 1556 08/06/12 0345 08/06/12 1205 08/07/12 0410  HGB 9.2*  --  8.8*  --   --  7.7* 7.6* 7.5*  WBC 28.8*  --  29.0*  --   --  19.1*  --  17.8*  PLT 478*  --  503*  --   --  458*  --  437*  NA 135  --  134*  --   --  137  --  139  K 3.8  --  3.7  --   --  3.7  --  3.1*  CL 98  --  98  --   --  104  --  107  CO2 25  --  25  --   --  27  --  26  GLUCOSE 86  --  95  --   --  126*  --  119*  BUN 21  --  18  --   --  23  --  17  CREATININE 0.77  --  0.62  --   --  0.69  --  0.57  CALCIUM 9.8  --  9.6  --   --  9.1  --  8.6  MG  --   --  1.7  --   --  2.0  --  1.8  PHOS  --   --  3.1  --   --  3.6  --  3.2  AST 17  --  23  --   --  16  --  10  ALT 13  --  17  --   --  16  --  9  ALKPHOS 157*  --  156*  --   --  134*  --  110  BILITOT 0.5  --  0.5  --   --  0.4  --  0.2*  PROT 8.5*  --  8.0  --   --  7.0  --  6.1  ALBUMIN 2.3*  --  2.2*  --   --  1.7*  --  1.5*  APTT  --  34  --   --   --   --   --   --   INR  --  1.28  --   --   --   --   --   --   LATICACIDVEN 1.4  --   --   --   --   --   --   --   TROPONINI <0.30  --  <0.30 <0.30 <0.30  --   --   --   PROBNP 160.9*  --   --   --   --   --   --   --     Recent Labs Lab 08/06/12 1226 08/06/12 1816 08/06/12 2335 08/07/12 0532 08/07/12 0734  GLUCAP 104* 121* 120* 114* 117*    Imaging: Ct Head Wo Contrast  08/06/2012  *RADIOLOGY REPORT*  Clinical Data: Altered mental status.  CT HEAD WITHOUT CONTRAST  Technique:  Contiguous axial images were obtained from the base of the skull through the vertex without contrast.  Comparison: None.  Findings: There is no evidence of acute intracranial abnormality including infarction, hemorrhage, midline shift, abnormal extra- axial fluid collection, mass, mass effect, midline shift or  hydrocephalus is identified.  There is no pneumocephalus. The calvarium is intact.  Imaged paranasal sinuses and mastoid air cells are clear.  IMPRESSION: No acute finding.   Original Report Authenticated By: Holley Dexter, M.D.    Dg Chest Port 1 View  08/07/2012  *RADIOLOGY REPORT*  Clinical Data: Follow-up left lower lobe infiltrate.  Pulmonary embolism.  PORTABLE CHEST - 1 VIEW  Comparison: CT 08/05/2012.  Radiographs 08/04/2012.  Findings: 0512 hours.  The right arm PICC is unchanged at the SVC right atrial level.  There is stable cardiomegaly.  There has been some worsening of the left lower lobe air space disease.  There are probable small bilateral pleural effusions.  No pneumothorax is seen.  IMPRESSION: Slight worsening of left lower lobe air space disease.  This could reflect pneumonia or pulmonary infarction given the prior CT findings.   Original Report Authenticated By: Carey Bullocks, M.D.      ASSESSMENT / PLAN:  PULMONARY A: Hemoptysis secondary to acute PE with pulmonary infarct. Hx of COPD with emphysema. P:   -Need to continue heparin gtt -albuterol prn -f/u CXR intermittently  CARDIOVASCULAR A: Low blood pressure >> improved 4/17. P:  -maintain even to positive fluid balance  RENAL A:  Hypokalemia. P:   -monitor renal fx, urine outpt, electrolytes  GASTROINTESTINAL A:  S/p gastric perforation with concern for leak. Protein calorie malnutrition. P:   -per CCS -TNA  per primary team and CCS  HEMATOLOGIC A:  Anemia. P:  -f/u CBC -transfuse for Hb < 7  INFECTIOUS A:  Peritonitis. LLL infiltrate on CXR/CT chest likely from pulmonary infarct and not HCAP. P:   -Continue Abx, antifungal per primary team and CCS -f/u culture results  ENDOCRINE A:  No issues. P:   -Monitor blood sugar on BMET  NEUROLOGIC A:  Acute encephalopathy >> improved 4/17; likely from duragesic. P:   -per primary team   PCCM will sign off.  Please call if additional help  needed.  Coralyn Helling, MD [facility] Pulmonary/Critical Care 08/07/2012, 9:10 AM Pager:  [phone] After 3pm call: [phone]

## 2012-08-07 NOTE — Progress Notes (Signed)
[facility]                                                                                Patient Demographics  Cynthia Bowen, is a 67 y.o. female, DOB - Jun 24, 1945, ZOX:[id number], WJX:[id number]  Admit date - 08/04/2012  Admitting Physician Eduard Clos, MD  Outpatient Primary MD for the patient is [name], [name], MD  LOS - 3   Chief Complaint  Patient presents with  . Chest Pain  . Abdominal Pain        Assessment & Plan   Summary   67 year old [place] American female who has had a complex abdominal surgical history in the last few months, according to old chart review and discussions with surgery patient had initial incarcerated epigastric hernia repair and 07/02/2012, this was complicated by gastric perforation with abscess/peritonitis, status post omental patch repair x2 in March of 2014, she required PICC line placement for TNA, she had an abdominal drain placed, and discharged on 07/26/2012 by general surgery, she came back on 08/05/2012 with chest and abdominal pain, in the ER workup was consistent of acute bilateral PEs she was placed on heparin drip, also she had leukocytosis and an abdominal CT was suggestive of possible another bowel leak. Hospitalist were requested to admit the patient, general surgery on consult.    I have requested Dr. Daphine Deutscher to consider transferring her care under general surgery service on 08/06/2012, her primary problem again continues to be postop surgical complication, patient was under surgery service for about 3 weeks few days ago, and this problem continues to be addressed by general surgery.   Dr. Daphine Deutscher has graciously accepted her under his care, Hospitalist team will follow the patient throughout her Hospital stay. All PE and Medical issues will be addressed by our team.      1. Bilateral PE in the setting of decreased activity, dehydration, recent multiple abdominal surgeries. Patient also has evidence of new  bowel leak along with leukocytosis and may need another surgical procedure, for now I agree with IV heparin drip. Her upper and lower extremity Dopplers are negative, she has some blood in her phlegm right now but I think this is due to her underlying PE, H&H remained stable and she is clinically better, appreciate PC CM input, continue heparin drip cautiously.        2. Abdominal pain with recent 3 abdominal surgeries in March of 2014 by general surgery including incarcerated epigastric hernia repair, post biopsy gastric perforation, abdominal abscess and peritonitis of omental patch repair all in March of 2014. For now Zosyn to be continued, new CT abdomen showing possible bowel leak.   Continue bowel rest, IV Zosyn, IV micafungin as discussed with Dr. Orvan Falconer infectious disease as she is allergic to fluconazole, bowel rest for TNA . General surgery following the patient closely, IR unable to place a new drain, will defer further management of this issue to [facility].       3. Leukocytosis reactionary due to #2 above. Monitor clinically. CBC shows improvement in leukocytosis with above management.     4. COPD currently at baseline exam not consistent with any exacerbation as needed oxygen and  nebulizer treatments.     5. Protein calorie malnutrition. TNA per pharmacy     6. SIRs with mild hypotension. Continue TNA, normal saline bolus plus maintenance, we'll transfuse early if remains hypotensive. However currently she is asymptomatic with good urine output. She is a slender lady and baseline blood pressures could be low.     7. Anemia. Has anemia of chronic disease, anemia panel is inconclusive, IV PPI, no history suggestive of blood in stool or melena, H&H is stable once accounted for dilution from IV fluids and TNA, if patient remains persistently hypotensive I might transfuse her around hemoglobin of 7.5. Type screen has been done H&H being monitored.     8.Mild delirium on  08/06/2012 - due to acute illness, hospital setting, narcotics. No focal neurological deficits, CT Head stable, reduced narcotics, delirium improved already, now appears to be at baseline.      Code Status: Full  Family Communication: patient  Disposition Plan: TBD   Procedures CT angio chest, CT Abd-Pelvis, previous PICC,CXR  Upper and lower extremity Dopplers negative  Consults  [facility]. ID Dr. Orvan Falconer over the phone, PCCM   DVT Prophylaxis    Heparin   Lab Results  Component Value Date   PLT 437* 08/07/2012    Medications  Scheduled Meds: . chlorhexidine  15 mL Mouth Rinse BID  . insulin aspart  0-9 Units Subcutaneous Q6H  . micafungin (MYCAMINE) IV  100 mg Intravenous Daily  . pantoprazole (PROTONIX) IV  40 mg Intravenous Q24H  . piperacillin-tazobactam (ZOSYN)  IV  3.375 g Intravenous Q8H  . sodium chloride  500 mL Intravenous Once  . sodium chloride  3 mL Intravenous Q12H   Continuous Infusions: . Marland KitchenTPN (CLINIMIX-E) Adult 100 mL/hr at 08/06/12 1900  . sodium chloride 20 mL/hr at 08/07/12 0409  . fat emulsion 250 mL (08/06/12 1729)  . heparin 1,650 Units/hr (08/06/12 2015)   PRN Meds:.acetaminophen, acetaminophen, albuterol, diphenhydrAMINE, morphine injection, ondansetron (ZOFRAN) IV, ondansetron  Antibiotics     Anti-infectives   Start     Dose/Rate Route Frequency Ordered Stop   08/05/12 1200  micafungin (MYCAMINE) 100 mg in sodium chloride 0.9 % 100 mL IVPB     100 mg 100 mL/hr over 1 Hours Intravenous Daily 08/05/12 1113     08/05/12 0500  piperacillin-tazobactam (ZOSYN) IVPB 3.375 g     3.375 g 12.5 mL/hr over 240 Minutes Intravenous 3 times per day 08/05/12 0408         Time Spent in minutes   45   SINGH,PRASHANT K M.D on 08/07/2012 at 9:21 AM  Between 7am to 7pm - Pager - [phone]  After 7pm go to [email or web address] - password [email or web address]  And look for the night coverage person covering for me after hours  [facility] Office   [phone]    Subjective:   Cynthia Bowen today has, No headache, No chest pain No SOB now , improved but + abdominal pain  - No Nausea, No new weakness tingling or numbness, No Cough .   Objective:   Filed Vitals:   08/07/12 0400 08/07/12 0500 08/07/12 0600 08/07/12 0800  BP: 84/42  97/55 106/54  Pulse:      Temp: 97.8 F (36.6 C)   97.7 F (36.5 C)  TempSrc: Oral   Oral  Resp: 15  13 20   Height:      Weight: 44.4 kg (97 lb 14.2 oz) 44.4 kg (97 lb 14.2 oz)    SpO2: 97%  96%  94%    Wt Readings from Last 3 Encounters:  08/07/12 44.4 kg (97 lb 14.2 oz)  07/14/12 44.3 kg (97 lb 10.6 oz)  07/14/12 44.3 kg (97 lb 10.6 oz)     Intake/Output Summary (Last 24 hours) at 08/07/12 0921 Last data filed at 08/07/12 0800  Gross per 24 hour  Intake 3628.48 ml  Output   1770 ml  Net 1858.48 ml    Exam Awake , Alert, Oriented X 3, No new F.N deficits, Normal affect [place].AT,PERRAL Supple Neck,No JVD, No cervical lymphadenopathy appriciated.  Symmetrical Chest wall movement, Good air movement bilaterally, CTAB RRR,No Gallops,Rubs or new Murmurs, No Parasternal Heave Hypo active B.Sounds, Abd Soft but has mild generalized tenderness, Abd drain in place, No organomegaly appriciated, No rebound - guarding or rigidity.   No Cyanosis, Clubbing or edema, No new Rash or bruise     Data Review   Micro Results  Recent Results (from the past 240 hour(s))  URINE CULTURE     Status: None   Collection Time    08/04/12 11:03 PM      Result Value Range Status   Specimen Description URINE, CATHETERIZED   Final   Special Requests NONE   Final   Culture  Setup Time 08/05/2012 07:00   Final   Colony Count NO GROWTH   Final   Culture NO GROWTH   Final   Report Status 08/06/2012 FINAL   Final  CULTURE, BLOOD (ROUTINE X 2)     Status: None   Collection Time    08/05/12  4:25 AM      Result Value Range Status   Specimen Description BLOOD LEFT ARM   Final   Special Requests BOTTLES DRAWN  AEROBIC ONLY 1 CC   Final   Culture  Setup Time 08/05/2012 08:32   Final   Culture     Final   Value:        BLOOD CULTURE RECEIVED NO GROWTH TO DATE CULTURE WILL BE HELD FOR 5 DAYS BEFORE ISSUING A FINAL NEGATIVE REPORT   Report Status PENDING   Incomplete  CULTURE, BLOOD (ROUTINE X 2)     Status: None   Collection Time    08/05/12  4:30 AM      Result Value Range Status   Specimen Description BLOOD LEFT WRIST   Final   Special Requests BOTTLES DRAWN AEROBIC ONLY 2CC   Final   Culture  Setup Time 08/05/2012 08:32   Final   Culture     Final   Value:        BLOOD CULTURE RECEIVED NO GROWTH TO DATE CULTURE WILL BE HELD FOR 5 DAYS BEFORE ISSUING A FINAL NEGATIVE REPORT   Report Status PENDING   Incomplete  GRAM STAIN     Status: None   Collection Time    08/05/12  8:50 AM      Result Value Range Status   Specimen Description ABDOMEN JP FLUID   Final   Special Requests Normal   Final   Gram Stain     Final   Value: CYTOSPIN     WBC PRESENT,BOTH PMN AND MONONUCLEAR     YEAST     GRAM NEGATIVE RODS     GRAM POSITIVE COCCI IN CHAINS IN PAIRS   Report Status 08/05/2012 FINAL   Final  WOUND CULTURE     Status: None   Collection Time    08/05/12  8:50 AM      Result Value  Range Status   Specimen Description DRAINAGE JP   Final   Special Requests Normal   Final   Gram Stain     Final   Value: WBC PRESENT,BOTH PMN AND MONONUCLEAR     YEAST     GRAM NEGATIVE RODS     GRAM POSITIVE COCCI     IN PAIRS IN CHAINS Gram Stain Report Called to,Read Back By and Verified With: Gram Stain Report Called to,Read Back By and Verified With: J CONRAD RN 1000 08/05/12 BY SHUEA Performed by [facility] CYTOSPIN SLIDE   Culture PENDING   Incomplete   Report Status PENDING   Incomplete  ANAEROBIC CULTURE     Status: None   Collection Time    08/05/12  8:50 AM      Result Value Range Status   Specimen Description DRAINAGE JP   Final   Special Requests Normal   Final   Gram Stain     Final    Value: NO WBC SEEN     NO SQUAMOUS EPITHELIAL CELLS SEEN     NO ORGANISMS SEEN   Culture     Final   Value: NO ANAEROBES ISOLATED; CULTURE IN PROGRESS FOR 5 DAYS   Report Status PENDING   Incomplete       Ct Angio Chest Pe W/cm &/or Wo Cm  08/05/2012  *RADIOLOGY REPORT*  Clinical Data: Shortness of breath.  Sharp left chest pain.  CT ANGIOGRAPHY CHEST  Technique:  Multidetector CT imaging of the chest using the standard protocol during bolus administration of intravenous contrast. Multiplanar reconstructed images including MIPs were obtained and reviewed to evaluate the vascular anatomy.  Contrast: 80mL OMNIPAQUE IOHEXOL 350 MG/ML SOLN  Comparison: CT scan of the chest dated 04/10/2012 and CT scan of the abdomen dated 07/13/2012  Findings: There is an extensive large pulmonary embolus in the pulmonary artery to the left lower lobe.  There is secondary extensive left lower lobe consolidation and interstitial accentuation which may represent pulmonary infarction. Tiny emboli in the proximal portion of the pulmonary artery to the left upper lobe.  This is superimposed on diffuse emphysema.  Heart size is normal. No hilar mediastinal adenopathy.  The right lung is clear except for some slight atelectasis at the right base.  There is a small chronic pericardial effusion.  No acute osseous abnormality.  The thoracic aorta is normal except for slight calcification. Origins of the brachiocephalic vessels are widely patent.  IMPRESSION:  1.  Large extensive pulmonary embolus in the left lower lobe pulmonary artery.  Smaller emboli in the left upper lobe pulmonary artery. 2.  Possible infarction in the left lower lobe. 3.  Diffuse emphysema.   Original Report Authenticated By: Francene Boyers, M.D.    Ct Abdomen Pelvis W Contrast  08/05/2012  *RADIOLOGY REPORT*  Clinical Data: Abdominal pain.  Recent gastric perforation.  CT ABDOMEN AND PELVIS WITH CONTRAST  Technique:  Multidetector CT imaging of the abdomen and  pelvis was performed following the standard protocol during bolus administration of intravenous contrast.  Contrast: 80mL OMNIPAQUE IOHEXOL 350 MG/ML SOLN  Comparison: CT scan dated 07/13/2012  Findings: Drain is present in the upper abdomen anterior to the antrum of the stomach.  There are a few small extraluminal air fluid collections at that site, slightly diminished. There is an area of what is probably residual contrast and a there are fluid collection posterior to the left lobe of the liver seen on image number 26 of series 7.  Thickening of the mucosa of the distal stomach is unchanged. There are a few extraluminal air bubbles anterior to the distal antrum.  I cannot exclude a recurrent perforation based on these images.  Liver, spleen, pancreas, adrenal glands, and kidneys appear stable. Bilateral renal cysts and right renal calculi are unchanged.  No dilated large or small bowel.  Stable 2.5 cm cyst on the right ovary.  IMPRESSION: Interval slight decrease in the fluid and air collections anterior to the stomach adjacent to the soft tissue drain. Persistent or recurrent contrast in the fluid collections anterior to the stomach.  This could represent a recurrent leak.  No other change.   Original Report Authenticated By: Francene Boyers, M.D.          Dg Chest Port 1 View  08/04/2012  *RADIOLOGY REPORT*  Clinical Data: Chest pain and cough.  PORTABLE CHEST - 1 VIEW  Comparison: Chest CT 07/13/2012.  Findings: The cardiac silhouette, mediastinal and hilar contours are stable.  Persistent left lower lobe process appears to be a combination of a pleural effusion and atelectasis.  Minimal right basilar atelectasis also.  Slight increased interstitial markings could be due to bronchitis or interstitial pneumonitis.  The right PICC line tip is in the distal SVC.  The bony thorax is intact.  IMPRESSION: Persistent left lower lobe process. Possible bronchitis or interstitial pneumonitis.   Original Report  Authenticated By: [name], M.D.       CBC  Recent Labs Lab 08/04/12 2320 08/05/12 0447 08/06/12 0345 08/06/12 1205 08/07/12 0410  WBC 28.8* 29.0* 19.1*  --  17.8*  HGB 9.2* 8.8* 7.7* 7.6* 7.5*  HCT 27.2* 25.5* 22.8* 22.1* 22.0*  PLT 478* 503* 458*  --  437*  MCV 81.0 79.4 80.6  --  81.5  MCH 27.4 27.4 27.2  --  27.8  MCHC 33.8 34.5 33.8  --  34.1  RDW 15.5 15.3 15.7*  --  15.9*  LYMPHSABS 2.0  --  1.4  --   --   MONOABS 2.0*  --  1.5*  --   --   EOSABS 0.0  --  0.1  --   --   BASOSABS 0.0  --  0.0  --   --     Chemistries   Recent Labs Lab 08/04/12 2320 08/05/12 0447 08/06/12 0345 08/07/12 0410  NA 135 134* 137 139  K 3.8 3.7 3.7 3.1*  CL 98 98 104 107  CO2 25 25 27 26   GLUCOSE 86 95 126* 119*  BUN 21 18 23 17   CREATININE 0.77 0.62 0.69 0.57  CALCIUM 9.8 9.6 9.1 8.6  MG  --  1.7 2.0 1.8  AST 17 23 16 10   ALT 13 17 16 9   ALKPHOS 157* 156* 134* 110  BILITOT 0.5 0.5 0.4 0.2*   ------------------------------------------------------------------------------------------------------------------ estimated creatinine clearance is 49.1 ml/min (by C-G formula based on Cr of 0.57). ------------------------------------------------------------------------------------------------------------------ No results found for this basename: HGBA1C,  in the last 72 hours ------------------------------------------------------------------------------------------------------------------  Recent Labs  08/05/12 0447  CHOL 101  TRIG 122   ------------------------------------------------------------------------------------------------------------------ No results found for this basename: TSH, T4TOTAL, FREET3, T3FREE, THYROIDAB,  in the last 72 hours ------------------------------------------------------------------------------------------------------------------  Recent Labs  08/06/12 0800  VITAMINB12 358  FOLATE 5.8  FERRITIN 371*  TIBC 199*  IRON 14*  RETICCTPCT 2.3     Coagulation profile  Recent Labs Lab 08/05/12 0318  INR 1.28     Recent Labs  08/04/12 2320  DDIMER 4.02*  Cardiac Enzymes  Recent Labs Lab 08/05/12 0447 08/05/12 1025 08/05/12 1556  TROPONINI <0.30 <0.30 <0.30   ------------------------------------------------------------------------------------------------------------------ No components found with this basename: POCBNP,

## 2012-08-07 NOTE — Progress Notes (Signed)
ANTICOAGULATION CONSULT NOTE - Follow Up Consult  Pharmacy Consult for Heparin Indication: pulmonary embolus  Allergies  Allergen Reactions  . Avelox (Moxifloxacin Hcl In Nacl) Nausea And Vomiting  . Alendronate Sodium Nausea And Vomiting  . Aspirin Nausea Only  . Codeine Nausea And Vomiting  . Doxycycline   . Fluconazole   . Hydrocodone Nausea And Vomiting    GI distress  . Neurontin (Gabapentin) Other (See Comments)    Mood changes   . Nsaids Other (See Comments)    Severe gastritis & perforation - avoid NSAIDs when possible  . Sertraline Hcl Other (See Comments)    Hallucinations   . Sulfa Antibiotics Rash    Patient Measurements: Height: 5\' 5"  (165.1 cm) Weight: 97 lb 14.2 oz (44.4 kg) IBW/kg (Calculated) : 57 Heparin Dosing Weight:   Vital Signs: Temp: 97.8 F (36.6 C) (04/17 0400) Temp src: Oral (04/17 0400) BP: 84/42 mmHg (04/17 0400)  Labs:  Recent Labs  08/04/12 2320 08/05/12 0318 08/05/12 0447 08/05/12 1025  08/05/12 1556  08/06/12 0345 08/06/12 1205 08/06/12 1830 08/07/12 0220 08/07/12 0410  HGB 9.2*  --  8.8*  --   --   --   --  7.7* 7.6*  --   --  7.5*  HCT 27.2*  --  25.5*  --   --   --   --  22.8* 22.1*  --   --  22.0*  PLT 478*  --  503*  --   --   --   --  458*  --   --   --  437*  APTT  --  34  --   --   --   --   --   --   --   --   --   --   LABPROT  --  15.7*  --   --   --   --   --   --   --   --   --   --   INR  --  1.28  --   --   --   --   --   --   --   --   --   --   HEPARINUNFRC  --   --   --   --   < >  --   < > 0.10* 0.27* 0.22* 0.48  --   CREATININE 0.77  --  0.62  --   --   --   --  0.69  --   --   --  0.57  TROPONINI <0.30  --  <0.30 <0.30  --  <0.30  --   --   --   --   --   --   < > = values in this interval not displayed.  Estimated Creatinine Clearance: 49.1 ml/min (by C-G formula based on Cr of 0.57).   Medications:  Infusions:  . [EXPIRED] .TPN (CLINIMIX-E) Adult 50 mL/hr at 08/05/12 1746  . Marland KitchenTPN (CLINIMIX-E)  Adult 100 mL/hr at 08/06/12 1900  . sodium chloride 20 mL/hr at 08/07/12 0409  . fat emulsion 250 mL (08/06/12 1729)  . heparin 1,650 Units/hr (08/06/12 2015)    Assessment: Patient with heparin at goal.  No issues per RN.  Goal of Therapy:  Heparin level 0.3-0.7 units/ml Monitor platelets by anticoagulation protocol: Yes   Plan:  Continue heparin at current rate, recheck level at 1000  [address] Dulce Crowford 08/07/2012,5:47 AM

## 2012-08-07 NOTE — Progress Notes (Signed)
Subjective: Pt not feeling well, but pain is much improved from yesterday.  She's no longer getting the sharp pains in her abdomen.  Pt c/o chest pains.  Pt not ambulating much   Objective: Vital signs in last 24 hours: Temp:  [97.7 F (36.5 C)-99.6 F (37.6 C)] 97.7 F (36.5 C) (04/17 0800) Resp:  [13-38] 15 (04/17 1000) BP: (77-114)/(42-62) 110/58 mmHg (04/17 1000) SpO2:  [91 %-98 %] 93 % (04/17 1000) Weight:  [97 lb 14.2 oz (44.4 kg)] 97 lb 14.2 oz (44.4 kg) (04/17 0500) Last BM Date: 07/24/12  Intake/Output from previous day: 04/16 0701 - 04/17 0700 In: 4021.5 [I.V.:744; IV Piggyback:1350; TPN:1907.5] Out: 1570 [Urine:1450; Drains:120] Intake/Output this shift: Total I/O In: 1190 [I.V.:49.5; IV Piggyback:800; TPN:340.5] Out: 630 [Urine:600; Drains:30]  PE: Gen:  Alert, NAD, pleasant Abd: Soft, NT/ND, +BS, no HSM, incisions C/D/I, drain with minimal sanguinous drainage   Lab Results:   Recent Labs  08/06/12 0345 08/06/12 1205 08/07/12 0410  WBC 19.1*  --  17.8*  HGB 7.7* 7.6* 7.5*  HCT 22.8* 22.1* 22.0*  PLT 458*  --  437*   BMET  Recent Labs  08/06/12 0345 08/07/12 0410  NA 137 139  K 3.7 3.1*  CL 104 107  CO2 27 26  GLUCOSE 126* 119*  BUN 23 17  CREATININE 0.69 0.57  CALCIUM 9.1 8.6   PT/INR  Recent Labs  08/05/12 0318  LABPROT 15.7*  INR 1.28   CMP     Component Value Date/Time   NA 139 08/07/2012 0410   K 3.1* 08/07/2012 0410   CL 107 08/07/2012 0410   CO2 26 08/07/2012 0410   GLUCOSE 119* 08/07/2012 0410   BUN 17 08/07/2012 0410   BUN 4 08/19/2009   CREATININE 0.57 08/07/2012 0410   CREATININE 0.73 04/08/2012 1446   CALCIUM 8.6 08/07/2012 0410   PROT 6.1 08/07/2012 0410   ALBUMIN 1.5* 08/07/2012 0410   AST 10 08/07/2012 0410   ALT 9 08/07/2012 0410   ALKPHOS 110 08/07/2012 0410   BILITOT 0.2* 08/07/2012 0410   GFRNONAA >90 08/07/2012 0410   GFRAA >90 08/07/2012 0410   Lipase     Component Value Date/Time   LIPASE 18 08/04/2012 2320        Studies/Results: Ct Head Wo Contrast  08/06/2012  *RADIOLOGY REPORT*  Clinical Data: Altered mental status.  CT HEAD WITHOUT CONTRAST  Technique:  Contiguous axial images were obtained from the base of the skull through the vertex without contrast.  Comparison: None.  Findings: There is no evidence of acute intracranial abnormality including infarction, hemorrhage, midline shift, abnormal extra- axial fluid collection, mass, mass effect, midline shift or hydrocephalus is identified.  There is no pneumocephalus. The calvarium is intact.  Imaged paranasal sinuses and mastoid air cells are clear.  IMPRESSION: No acute finding.   Original Report Authenticated By: Holley Dexter, M.D.    Dg Chest Port 1 View  08/07/2012  *RADIOLOGY REPORT*  Clinical Data: Follow-up left lower lobe infiltrate.  Pulmonary embolism.  PORTABLE CHEST - 1 VIEW  Comparison: CT 08/05/2012.  Radiographs 08/04/2012.  Findings: 0512 hours.  The right arm PICC is unchanged at the SVC right atrial level.  There is stable cardiomegaly.  There has been some worsening of the left lower lobe air space disease.  There are probable small bilateral pleural effusions.  No pneumothorax is seen.  IMPRESSION: Slight worsening of left lower lobe air space disease.  This could reflect pneumonia or pulmonary infarction  given the prior CT findings.   Original Report Authenticated By: Carey Bullocks, M.D.     Anti-infectives: Anti-infectives   Start     Dose/Rate Route Frequency Ordered Stop   08/05/12 1200  micafungin (MYCAMINE) 100 mg in sodium chloride 0.9 % 100 mL IVPB     100 mg 100 mL/hr over 1 Hours Intravenous Daily 08/05/12 1113     08/05/12 0500  piperacillin-tazobactam (ZOSYN) IVPB 3.375 g     3.375 g 12.5 mL/hr over 240 Minutes Intravenous 3 times per day 08/05/12 0408         Assessment/Plan Gastric perforation & peritonitis s/p Exploratory laparotomy, drainage of peritoneal abscess, omental patch of gastric  perforation (Dr. [name] - 3/17) and Ex Lap with drainage of intra-abdominal abscess and repair gastric leak (Dr. Johna Sheriff - 3/23)  Now with recurrent leak with good placement of drain 1.  Conservative management including NPO, IVF, TPN, pain control, antiemetics, Zosyn 2.  She is too high risk at this point to take back to the OR given her PE and on anticoagulation 3.  Cont to monitor 4.  PT/OT, up OOB 5.  If drainage is less tomorrow, may be able to start feeding sips of liquids to see if the drainage increases  PE - on heparin Protein calorie malnutrition - on TPN Pulmonary infarct SIRS - mild Anemia of CD       LOS: 3 days    DORT, Avir Deruiter 08/07/2012, 11:14 AM Pager: [phone]

## 2012-08-07 NOTE — Progress Notes (Signed)
eLink Physician-Brief Progress Note Patient Name: Cynthia Bowen DOB: Dec 25, 1945 MRN: [id number]  Date of Service  08/07/2012   HPI/Events of Note  Hypokalemia   eICU Interventions  Potassium replaced   Intervention Category Minor Interventions: Electrolytes abnormality - evaluation and management  [name] 08/07/2012, 5:00 AM

## 2012-08-08 DIAGNOSIS — E43 Unspecified severe protein-calorie malnutrition: Secondary | ICD-10-CM

## 2012-08-08 LAB — CBC
HCT: 21.5 % — ABNORMAL LOW (ref 36.0–46.0)
Hemoglobin: 7.5 g/dL — ABNORMAL LOW (ref 12.0–15.0)
RBC: 2.72 MIL/uL — ABNORMAL LOW (ref 3.87–5.11)

## 2012-08-08 LAB — COMPREHENSIVE METABOLIC PANEL
ALT: 8 U/L (ref 0–35)
Alkaline Phosphatase: 105 U/L (ref 39–117)
CO2: 25 mEq/L (ref 19–32)
Chloride: 105 mEq/L (ref 96–112)
GFR calc Af Amer: >90 mL/min (ref >90)
GFR calc non Af Amer: >90 mL/min (ref >90)
Glucose, Bld: 114 mg/dL — ABNORMAL HIGH (ref 70–99)
Potassium: 4.2 mEq/L (ref 3.5–5.1)
Sodium: 137 mEq/L (ref 135–145)
Total Bilirubin: 0.2 mg/dL — ABNORMAL LOW (ref 0.3–1.2)
Total Protein: 6.2 g/dL (ref 6.0–8.3)

## 2012-08-08 LAB — GLUCOSE, CAPILLARY
Glucose-Capillary: 108 mg/dL — ABNORMAL HIGH (ref 70–99)
Glucose-Capillary: 114 mg/dL — ABNORMAL HIGH (ref 70–99)
Glucose-Capillary: 121 mg/dL — ABNORMAL HIGH (ref 70–99)
Glucose-Capillary: 90 mg/dL (ref 70–99)

## 2012-08-08 LAB — TROPONIN I
Troponin I: <0.3 ng/mL (ref <0.30)
Troponin I: <0.3 ng/mL (ref <0.30)

## 2012-08-08 MED ORDER — ENOXAPARIN SODIUM 60 MG/0.6ML ~~LOC~~ SOLN
1.0000 mg/kg | Freq: Two times a day (BID) | SUBCUTANEOUS | Status: DC
  Administered 2012-08-08 – 2012-08-14 (×13): 45 mg via SUBCUTANEOUS
  Filled 2012-08-08 (×14): qty 0.6

## 2012-08-08 MED ORDER — PANTOPRAZOLE SODIUM 40 MG IV SOLR
40.0000 mg | Freq: Two times a day (BID) | INTRAVENOUS | Status: DC
  Administered 2012-08-08 – 2012-08-14 (×13): 40 mg via INTRAVENOUS
  Filled 2012-08-08 (×13): qty 40

## 2012-08-08 MED ORDER — TRACE MINERALS CR-CU-F-FE-I-MN-MO-SE-ZN IV SOLN
INTRAVENOUS | Status: AC
  Administered 2012-08-08 (×2): via INTRAVENOUS
  Filled 2012-08-08: qty 2000

## 2012-08-08 MED ORDER — POTASSIUM CHLORIDE IN NACL 20-0.9 MEQ/L-% IV SOLN
INTRAVENOUS | Status: DC
  Administered 2012-08-08 (×2): via INTRAVENOUS
  Filled 2012-08-08 (×3): qty 1000

## 2012-08-08 MED ORDER — FAT EMULSION 20 % IV EMUL
240.0000 mL | INTRAVENOUS | Status: AC
  Administered 2012-08-08: 240 mL via INTRAVENOUS
  Filled 2012-08-08: qty 250

## 2012-08-08 MED ORDER — KETOROLAC TROMETHAMINE 30 MG/ML IJ SOLN
30.0000 mg | Freq: Once | INTRAMUSCULAR | Status: AC
  Administered 2012-08-08: 30 mg via INTRAVENOUS
  Filled 2012-08-08: qty 1

## 2012-08-08 NOTE — Progress Notes (Signed)
[facility]                                                                                Patient Demographics  Cynthia Bowen, is a 67 y.o. female, DOB - 12-Sep-1945, UXL:[id number], ZDG:[id number]  Admit date - 08/04/2012  Admitting Physician Eduard Clos, MD  Outpatient Primary MD for the patient is [name], [name], MD  LOS - 4   Chief Complaint  Patient presents with  . Chest Pain  . Abdominal Pain        Assessment & Plan   Summary   67 year old [place] American female who has had a complex abdominal surgical history in the last few months, according to old chart review and discussions with surgery patient had initial incarcerated epigastric hernia repair and 07/02/2012, this was complicated by gastric perforation with abscess/peritonitis, status post omental patch repair x2 in March of 2014, she required PICC line placement for TNA, she had an abdominal drain placed, and discharged on 07/26/2012 by general surgery, she came back on 08/05/2012 with chest and abdominal pain, in the ER workup was consistent of acute bilateral PEs she was placed on heparin drip, also she had leukocytosis and an abdominal CT was suggestive of possible another bowel leak. Hospitalist were requested to admit the patient, general surgery on consult.    I have requested Dr. Daphine Deutscher to consider transferring her care under general surgery service on 08/06/2012, her primary problem again continues to be postop surgical complication, patient was under surgery service for about 3 weeks few days ago, and this problem continues to be addressed by general surgery.   Dr. Daphine Deutscher has graciously accepted her under his care, Hospitalist team will follow the patient throughout her Hospital stay. All PE and Medical issues will be addressed by our team.      1. Bilateral PE in the setting of decreased activity, dehydration, recent multiple abdominal surgeries. Patient also has evidence of new  bowel leak along with leukocytosis and may need another surgical procedure, for now I agree with IV heparin drip. Her upper and lower extremity Dopplers are negative, she has some blood in her phlegm right now but I think this is due to her underlying PE, H&H remained stable and she is clinically better, appreciate PC CM input, as no surgery is planned Will switch from heparin drip to Lovenox on 08/08/2012, once taking oral diet she can be switched to xaralto under pharmacy consult.       2. Abdominal pain with recent 3 abdominal surgeries in March of 2014 by general surgery including incarcerated epigastric hernia repair, post biopsy gastric perforation, abdominal abscess and peritonitis of omental patch repair all in March of 2014. For now Zosyn to be continued, new CT abdomen showing possible bowel leak.   Continue bowel rest, IV Zosyn, IV micafungin as discussed with Dr. Orvan Falconer infectious disease as she is allergic to fluconazole, bowel rest for TNA . Will defer further management of this issue to [facility].       3. Leukocytosis reactionary due to #2 above. Monitor clinically. CBC shows improvement in leukocytosis with above management.     4. COPD currently at baseline exam  not consistent with any exacerbation as needed oxygen and nebulizer treatments.     5. Protein calorie malnutrition. TNA per pharmacy     6. SIRs with mild hypotension. Continue TNA, normal saline bolus plus maintenance, we'll transfuse early if remains hypotensive. However currently she is asymptomatic with good urine output. She is a slender lady and baseline blood pressures could be low.     7. Anemia. Has anemia of chronic disease, anemia panel is inconclusive, IV PPI, no history suggestive of blood in stool or melena, H&H is stable once accounted for dilution from IV fluids and TNA, if patient remains persistently hypotensive I might transfuse her around hemoglobin of 7.5. Type screen has been done H&H  being monitored.     8.Mild delirium on 08/06/2012 - due to acute illness, hospital setting, narcotics. No focal neurological deficits, CT Head stable, reduced narcotics, delirium improved already, now appears to be at baseline.    9. Mild left-sided pleuritic chest pain on 08/08/2012. Likely due to PE, bedside EKG unremarkable, cycle troponins, we'll give her one dose of Toradol, will increase PPI to twice a day. Monitor     Code Status: Full  Family Communication: patient  Disposition Plan: TBD   Procedures CT angio chest, CT Abd-Pelvis, previous PICC,CXR  Upper and lower extremity Dopplers negative  Consults  [facility]. ID Dr. Orvan Falconer over the phone, PCCM   DVT Prophylaxis    Heparin   Lab Results  Component Value Date   PLT 511* 08/08/2012    Medications  Scheduled Meds: . chlorhexidine  15 mL Mouth Rinse BID  . insulin aspart  0-9 Units Subcutaneous Q6H  . micafungin (MYCAMINE) IV  100 mg Intravenous Daily  . pantoprazole (PROTONIX) IV  40 mg Intravenous Q12H  . piperacillin-tazobactam (ZOSYN)  IV  3.375 g Intravenous Q8H  . sodium chloride  3 mL Intravenous Q12H   Continuous Infusions: . Marland KitchenTPN (CLINIMIX-E) Adult 65 mL/hr at 08/07/12 1723  . Marland KitchenTPN (CLINIMIX-E) Adult    . 0.9 % NaCl with KCl 20 mEq / L    . fat emulsion    . heparin 1,850 Units/hr (08/08/12 0634)   PRN Meds:.acetaminophen, acetaminophen, albuterol, diphenhydrAMINE, morphine injection, ondansetron (ZOFRAN) IV, ondansetron  Antibiotics     Anti-infectives   Start     Dose/Rate Route Frequency Ordered Stop   08/05/12 1200  micafungin (MYCAMINE) 100 mg in sodium chloride 0.9 % 100 mL IVPB     100 mg 100 mL/hr over 1 Hours Intravenous Daily 08/05/12 1113     08/05/12 0500  piperacillin-tazobactam (ZOSYN) IVPB 3.375 g     3.375 g 12.5 mL/hr over 240 Minutes Intravenous 3 times per day 08/05/12 0408         Time Spent in minutes   45   Londen Lorge K M.D on 08/08/2012 at 9:59 AM  Between  7am to 7pm - Pager - [phone]  After 7pm go to [email or web address] - password [email or web address]  And look for the night coverage person covering for me after hours  [facility] Office  [phone]    Subjective:   Cynthia Bowen today has, No headache, mild pleuritic left sided chest pain No SOB now , improved but + abdominal pain  - No Nausea, No new weakness tingling or numbness, No Cough .   Objective:   Filed Vitals:   08/08/12 0400 08/08/12 0500 08/08/12 0727 08/08/12 0800  BP: 108/64  126/56   Pulse:   70   Temp: 99.5 F (37.5  C)   97.5 F (36.4 C)  TempSrc: Oral   Oral  Resp: 24  27   Height:      Weight:  45.8 kg (100 lb 15.5 oz)    SpO2: 96%  98%     Wt Readings from Last 3 Encounters:  08/08/12 45.8 kg (100 lb 15.5 oz)  07/14/12 44.3 kg (97 lb 10.6 oz)  07/14/12 44.3 kg (97 lb 10.6 oz)     Intake/Output Summary (Last 24 hours) at 08/08/12 0959 Last data filed at 08/08/12 0600  Gross per 24 hour  Intake 3134.5 ml  Output   2830 ml  Net  304.5 ml    Exam Awake , Alert, Oriented X 3, No new F.N deficits, Normal affect [place].AT,PERRAL Supple Neck,No JVD, No cervical lymphadenopathy appriciated.  Symmetrical Chest wall movement, Good air movement bilaterally, CTAB RRR,No Gallops,Rubs or new Murmurs, No Parasternal Heave Hypo active B.Sounds, Abd Soft but has mild generalized tenderness, Abd drain in place, No organomegaly appriciated, No rebound - guarding or rigidity.   No Cyanosis, Clubbing or edema, No new Rash or bruise     Data Review   Micro Results  Recent Results (from the past 240 hour(s))  URINE CULTURE     Status: None   Collection Time    08/04/12 11:03 PM      Result Value Range Status   Specimen Description URINE, CATHETERIZED   Final   Special Requests NONE   Final   Culture  Setup Time 08/05/2012 07:00   Final   Colony Count NO GROWTH   Final   Culture NO GROWTH   Final   Report Status 08/06/2012 FINAL   Final  CULTURE, BLOOD  (ROUTINE X 2)     Status: None   Collection Time    08/05/12  4:25 AM      Result Value Range Status   Specimen Description BLOOD LEFT ARM   Final   Special Requests BOTTLES DRAWN AEROBIC ONLY 1 CC   Final   Culture  Setup Time 08/05/2012 08:32   Final   Culture     Final   Value:        BLOOD CULTURE RECEIVED NO GROWTH TO DATE CULTURE WILL BE HELD FOR 5 DAYS BEFORE ISSUING A FINAL NEGATIVE REPORT   Report Status PENDING   Incomplete  CULTURE, BLOOD (ROUTINE X 2)     Status: None   Collection Time    08/05/12  4:30 AM      Result Value Range Status   Specimen Description BLOOD LEFT WRIST   Final   Special Requests BOTTLES DRAWN AEROBIC ONLY 2CC   Final   Culture  Setup Time 08/05/2012 08:32   Final   Culture     Final   Value:        BLOOD CULTURE RECEIVED NO GROWTH TO DATE CULTURE WILL BE HELD FOR 5 DAYS BEFORE ISSUING A FINAL NEGATIVE REPORT   Report Status PENDING   Incomplete  GRAM STAIN     Status: None   Collection Time    08/05/12  8:50 AM      Result Value Range Status   Specimen Description ABDOMEN JP FLUID   Final   Special Requests Normal   Final   Gram Stain     Final   Value: CYTOSPIN     WBC PRESENT,BOTH PMN AND MONONUCLEAR     YEAST     GRAM NEGATIVE RODS     Romie Minus  POSITIVE COCCI IN CHAINS IN PAIRS   Report Status 08/05/2012 FINAL   Final  WOUND CULTURE     Status: None   Collection Time    08/05/12  8:50 AM      Result Value Range Status   Specimen Description DRAINAGE JP   Final   Special Requests Normal   Final   Gram Stain     Final   Value: WBC PRESENT,BOTH PMN AND MONONUCLEAR     YEAST     GRAM NEGATIVE RODS     GRAM POSITIVE COCCI     IN PAIRS IN CHAINS Gram Stain Report Called to,Read Back By and Verified With: Gram Stain Report Called to,Read Back By and Verified With: J CONRAD RN 1000 08/05/12 BY SHUEA Performed by [facility] CYTOSPIN SLIDE   Culture PENDING   Incomplete   Report Status PENDING   Incomplete  ANAEROBIC CULTURE     Status:  None   Collection Time    08/05/12  8:50 AM      Result Value Range Status   Specimen Description DRAINAGE JP   Final   Special Requests Normal   Final   Gram Stain     Final   Value: NO WBC SEEN     NO SQUAMOUS EPITHELIAL CELLS SEEN     NO ORGANISMS SEEN   Culture     Final   Value: NO ANAEROBES ISOLATED; CULTURE IN PROGRESS FOR 5 DAYS   Report Status PENDING   Incomplete       Ct Angio Chest Pe W/cm &/or Wo Cm  08/05/2012  *RADIOLOGY REPORT*  Clinical Data: Shortness of breath.  Sharp left chest pain.  CT ANGIOGRAPHY CHEST  Technique:  Multidetector CT imaging of the chest using the standard protocol during bolus administration of intravenous contrast. Multiplanar reconstructed images including MIPs were obtained and reviewed to evaluate the vascular anatomy.  Contrast: 80mL OMNIPAQUE IOHEXOL 350 MG/ML SOLN  Comparison: CT scan of the chest dated 04/10/2012 and CT scan of the abdomen dated 07/13/2012  Findings: There is an extensive large pulmonary embolus in the pulmonary artery to the left lower lobe.  There is secondary extensive left lower lobe consolidation and interstitial accentuation which may represent pulmonary infarction. Tiny emboli in the proximal portion of the pulmonary artery to the left upper lobe.  This is superimposed on diffuse emphysema.  Heart size is normal. No hilar mediastinal adenopathy.  The right lung is clear except for some slight atelectasis at the right base.  There is a small chronic pericardial effusion.  No acute osseous abnormality.  The thoracic aorta is normal except for slight calcification. Origins of the brachiocephalic vessels are widely patent.  IMPRESSION:  1.  Large extensive pulmonary embolus in the left lower lobe pulmonary artery.  Smaller emboli in the left upper lobe pulmonary artery. 2.  Possible infarction in the left lower lobe. 3.  Diffuse emphysema.   Original Report Authenticated By: Francene Boyers, M.D.    Ct Abdomen Pelvis W  Contrast  08/05/2012  *RADIOLOGY REPORT*  Clinical Data: Abdominal pain.  Recent gastric perforation.  CT ABDOMEN AND PELVIS WITH CONTRAST  Technique:  Multidetector CT imaging of the abdomen and pelvis was performed following the standard protocol during bolus administration of intravenous contrast.  Contrast: 80mL OMNIPAQUE IOHEXOL 350 MG/ML SOLN  Comparison: CT scan dated 07/13/2012  Findings: Drain is present in the upper abdomen anterior to the antrum of the stomach.  There are a few  small extraluminal air fluid collections at that site, slightly diminished. There is an area of what is probably residual contrast and a there are fluid collection posterior to the left lobe of the liver seen on image number 26 of series 7.  Thickening of the mucosa of the distal stomach is unchanged. There are a few extraluminal air bubbles anterior to the distal antrum.  I cannot exclude a recurrent perforation based on these images.  Liver, spleen, pancreas, adrenal glands, and kidneys appear stable. Bilateral renal cysts and right renal calculi are unchanged.  No dilated large or small bowel.  Stable 2.5 cm cyst on the right ovary.  IMPRESSION: Interval slight decrease in the fluid and air collections anterior to the stomach adjacent to the soft tissue drain. Persistent or recurrent contrast in the fluid collections anterior to the stomach.  This could represent a recurrent leak.  No other change.   Original Report Authenticated By: Francene Boyers, M.D.          Dg Chest Port 1 View  08/04/2012  *RADIOLOGY REPORT*  Clinical Data: Chest pain and cough.  PORTABLE CHEST - 1 VIEW  Comparison: Chest CT 07/13/2012.  Findings: The cardiac silhouette, mediastinal and hilar contours are stable.  Persistent left lower lobe process appears to be a combination of a pleural effusion and atelectasis.  Minimal right basilar atelectasis also.  Slight increased interstitial markings could be due to bronchitis or interstitial pneumonitis.   The right PICC line tip is in the distal SVC.  The bony thorax is intact.  IMPRESSION: Persistent left lower lobe process. Possible bronchitis or interstitial pneumonitis.   Original Report Authenticated By: [name], M.D.       CBC  Recent Labs Lab 08/04/12 2320 08/05/12 0447 08/06/12 0345 08/06/12 1205 08/07/12 0410 08/08/12 0600  WBC 28.8* 29.0* 19.1*  --  17.8* 16.6*  HGB 9.2* 8.8* 7.7* 7.6* 7.5* 7.5*  HCT 27.2* 25.5* 22.8* 22.1* 22.0* 21.5*  PLT 478* 503* 458*  --  437* 511*  MCV 81.0 79.4 80.6  --  81.5 79.0  MCH 27.4 27.4 27.2  --  27.8 27.6  MCHC 33.8 34.5 33.8  --  34.1 34.9  RDW 15.5 15.3 15.7*  --  15.9* 15.9*  LYMPHSABS 2.0  --  1.4  --   --   --   MONOABS 2.0*  --  1.5*  --   --   --   EOSABS 0.0  --  0.1  --   --   --   BASOSABS 0.0  --  0.0  --   --   --     Chemistries   Recent Labs Lab 08/04/12 2320 08/05/12 0447 08/06/12 0345 08/07/12 0410 08/08/12 0600  NA 135 134* 137 139 137  K 3.8 3.7 3.7 3.1* 4.2  CL 98 98 104 107 105  CO2 25 25 27 26 25   GLUCOSE 86 95 126* 119* 114*  BUN 21 18 23 17 11   CREATININE 0.77 0.62 0.69 0.57 0.55  CALCIUM 9.8 9.6 9.1 8.6 8.5  MG  --  1.7 2.0 1.8 1.8  AST 17 23 16 10 10   ALT 13 17 16 9 8   ALKPHOS 157* 156* 134* 110 105  BILITOT 0.5 0.5 0.4 0.2* 0.2*   ------------------------------------------------------------------------------------------------------------------ estimated creatinine clearance is 50.7 ml/min (by C-G formula based on Cr of 0.55). ------------------------------------------------------------------------------------------------------------------ No results found for this basename: HGBA1C,  in the last 72 hours ------------------------------------------------------------------------------------------------------------------ No results found for this basename:  CHOL, HDL, LDLCALC, TRIG, CHOLHDL, LDLDIRECT,  in the last 72  hours ------------------------------------------------------------------------------------------------------------------ No results found for this basename: TSH, T4TOTAL, FREET3, T3FREE, THYROIDAB,  in the last 72 hours ------------------------------------------------------------------------------------------------------------------  Recent Labs  08/06/12 0800  VITAMINB12 358  FOLATE 5.8  FERRITIN 371*  TIBC 199*  IRON 14*  RETICCTPCT 2.3    Coagulation profile  Recent Labs Lab 08/05/12 0318  INR 1.28    No results found for this basename: DDIMER,  in the last 72 hours  Cardiac Enzymes  Recent Labs Lab 08/05/12 1025 08/05/12 1556 08/08/12 0740  TROPONINI <0.30 <0.30 <0.30   ------------------------------------------------------------------------------------------------------------------ No components found with this basename: POCBNP,

## 2012-08-08 NOTE — Progress Notes (Signed)
PARENTERAL NUTRITION CONSULT NOTE - Follow-up  Pharmacy Consult for TNA Indication: Recurrent gastric perforation, prolonged NPO status  Allergies  Allergen Reactions  . Avelox (Moxifloxacin Hcl In Nacl) Nausea And Vomiting  . Alendronate Sodium Nausea And Vomiting  . Aspirin Nausea Only  . Codeine Nausea And Vomiting  . Doxycycline   . Fluconazole   . Hydrocodone Nausea And Vomiting    GI distress  . Neurontin (Gabapentin) Other (See Comments)    Mood changes   . Nsaids Other (See Comments)    Severe gastritis & perforation - avoid NSAIDs when possible  . Sertraline Hcl Other (See Comments)    Hallucinations   . Sulfa Antibiotics Rash    Patient Measurements: Height: 5\' 5"  (165.1 cm) Weight: 100 lb 15.5 oz (45.8 kg) IBW/kg (Calculated) : 57 Usual Weight: ~44kg  Vital Signs: Temp: 99.5 F (37.5 C) (04/18 0400) Temp src: Oral (04/18 0400) BP: 126/56 mmHg (04/18 0727) Pulse Rate: 70 (04/18 0727) Intake/Output from previous day: 04/17 0701 - 04/18 0700 In: 3994.5 [I.V.:1317; IV Piggyback:1350; TPN:1327.5] Out: 3030 [Urine:2950; Drains:80] Intake/Output from this shift:    Labs:  Recent Labs  08/06/12 0345 08/06/12 1205 08/07/12 0410 08/08/12 0600  WBC 19.1*  --  17.8* 16.6*  HGB 7.7* 7.6* 7.5* 7.5*  HCT 22.8* 22.1* 22.0* 21.5*  PLT 458*  --  437* 511*     Recent Labs  08/06/12 0345 08/07/12 0410 08/08/12 0600  NA 137 139 137  K 3.7 3.1* 4.2  CL 104 107 105  CO2 27 26 25   GLUCOSE 126* 119* 114*  BUN 23 17 11   CREATININE 0.69 0.57 0.55  CALCIUM 9.1 8.6 8.5  MG 2.0 1.8 1.8  PHOS 3.6 3.2  --   PROT 7.0 6.1 6.2  ALBUMIN 1.7* 1.5* 1.5*  AST 16 10 10   ALT 16 9 8   ALKPHOS 134* 110 105  BILITOT 0.4 0.2* 0.2*   Estimated Creatinine Clearance: 50.7 ml/min (by C-G formula based on Cr of 0.55).    Medications:  Scheduled:  . chlorhexidine  15 mL Mouth Rinse BID  . insulin aspart  0-9 Units Subcutaneous Q6H  . [COMPLETED] ketorolac  30 mg  Intravenous Once  . [COMPLETED] magnesium sulfate 1 - 4 g bolus IVPB  1 g Intravenous Once  . micafungin [facility]) IV  100 mg Intravenous Daily  . pantoprazole (PROTONIX) IV  40 mg Intravenous Q12H  . piperacillin-tazobactam (ZOSYN)  IV  3.375 g Intravenous Q8H  . [COMPLETED] potassium chloride  10 mEq Intravenous Q1 Hr x 4  . [COMPLETED] potassium chloride  10 mEq Intravenous Q1 Hr x 4  . [COMPLETED] sodium chloride  500 mL Intravenous Once  . sodium chloride  3 mL Intravenous Q12H  . [DISCONTINUED] pantoprazole (PROTONIX) IV  40 mg Intravenous Q24H  . [DISCONTINUED] potassium chloride  10 mEq Intravenous Q1 Hr x 6   Infusions:  . [EXPIRED] .TPN (CLINIMIX-E) Adult 100 mL/hr at 08/06/12 1900  . Marland KitchenTPN (CLINIMIX-E) Adult 65 mL/hr at 08/07/12 1723  . 0.9 % NaCl with KCl 20 mEq / L 75 mL/hr at 08/08/12 0548  . [EXPIRED] fat emulsion 250 mL (08/06/12 1729)  . heparin 1,850 Units/hr (08/08/12 0981)  . [DISCONTINUED] sodium chloride 20 mL/hr at 08/07/12 0409    Insulin Requirements in the past 24 hours:  1 units, on sensitive SSI q 6 hours  Current Nutrition:  Clinimix E5/20 at 65 ml/hr with lipids MWF at 10 ml/hr (as per previous admission).  Note  PTA Home TPN was being cycle over 18 hours: Dex 312gm, protein 78g, lipids 50mg  Tu/th, over 18h infusion  IVF: NS with 20 mEq KCl at 75 ml/hr  Nutritional Goals:  RD recs (4/15): Kcal: 0981-1914, Protein: 65-77 grams, Fluid: 1.8 L  Clinimix E5/20 @ 65 ml/hr (provides avg 1579 Kcal/day, 78 g protein/day).  Assessment: 11 y/oF with several recent hospitalizations for epigastric hernia repair and gastric biopsy, complicated by gastric perforation with abscess/periotinitis s/p omental patch repair. Pt was started on TPN 3/24 and d/c'd on home TPN 07/26/12.  Pt readmitted with BL PEs and CT suggestive of possible recurrent leak. Surgery consulted and is considering percutaneous drainage.  Pt now on IV heparin for PEs, empiric antibioitics,  and TNA resumed 4/15.  No issues with TNA infusion per RN.  Will adjust TNA to continuous 24 hour infusion while inpatient.  Pt reporting intermittent sharp abdominal pain.  Currently NPO with plans for continued bowel rest with TNA at this time.  Glucose: CBGs WNL (85-121) Electrolytes:  K WNL after replacement runs yesterday, other lytes WNL, corr Ca 10.5 Renal:  SCr stable LFTs: AST/ALT WNL, albumin low TGs: 122 (4/15) Prealbumin: 6.9 on 4/15, 8.1 (3/31), <3 (3/25)  Plan:   Continue Clinimix E5/20 @ 65 ml/hr.  Fat emulsion MWF only due to ongoing shortage  MVI/TE MWF d/t national backorder  Continue CBGs q6h, with sensitive scale SSI.  TNA lab panels on Mondays & Thursdays.  CMP and Mg ordered daily by physician  Clance Boll, PharmD, BCPS Pager: [phone] 08/08/2012 8:07 AM

## 2012-08-08 NOTE — Progress Notes (Signed)
PT Cancellation Note  Patient Details Name: Cynthia Bowen MRN: [id number] DOB: 06/14/1945   Cancelled Treatment:    Reason Eval/Treat Not Completed: Medical issues which prohibited therapy.  Per RN, MD deciding whether or not to perform sx and on Heparin drip.  Will check on pt tomorrow.   Thanks,    Vista Deck 08/08/2012, 10:10 AM

## 2012-08-08 NOTE — Progress Notes (Signed)
PHARMACY BRIEF NOTE: HEPARIN  Indication: Pulmonary embolus  Heparin level 0.28 on 1650 units/hr Hgb 7.5 Hct 21.5 Pltc 511K  Discussed with RN, who reports infusion running at correct rate without interruptions overnight.   Patient reportedly continues to have some low-grade hemoptysis.  Assessment: -Heparin level subtherapeutic. -Hgb low but stable. -Low-grade hemoptysis continues.  Plan: 1. Increase heparin to 1850 units/hr 2. Recheck heparin level at 1pm. 3. Full note to follow later today.  Elie Goody, PharmD, BCPS Pager: [phone] 08/08/2012  6:38 AM

## 2012-08-08 NOTE — Progress Notes (Signed)
ANTIBIOTIC CONSULT NOTE - FOLLOW UP  Pharmacy Consult for Zosyn Indication: intra-abdominal infection  Allergies  Allergen Reactions  . Avelox (Moxifloxacin Hcl In Nacl) Nausea And Vomiting  . Alendronate Sodium Nausea And Vomiting  . Aspirin Nausea Only  . Codeine Nausea And Vomiting  . Doxycycline   . Fluconazole   . Hydrocodone Nausea And Vomiting    GI distress  . Neurontin (Gabapentin) Other (See Comments)    Mood changes   . Nsaids Other (See Comments)    Severe gastritis & perforation - avoid NSAIDs when possible  . Sertraline Hcl Other (See Comments)    Hallucinations   . Sulfa Antibiotics Rash    Patient Measurements: Height: 5\' 5"  (165.1 cm) Weight: 100 lb 15.5 oz (45.8 kg) IBW/kg (Calculated) : 57  Vital Signs: Temp: 97.5 F (36.4 C) (04/18 0800) Temp src: Oral (04/18 0800) BP: 126/56 mmHg (04/18 0727) Pulse Rate: 70 (04/18 0727) Intake/Output from previous day: 04/17 0701 - 04/18 0700 In: 3994.5 [I.V.:1317; IV Piggyback:1350; TPN:1327.5] Out: 3030 [Urine:2950; Drains:80] Intake/Output from this shift:    Labs:  Recent Labs  08/06/12 0345 08/06/12 1205 08/07/12 0410 08/08/12 0600  WBC 19.1*  --  17.8* 16.6*  HGB 7.7* 7.6* 7.5* 7.5*  PLT 458*  --  437* 511*  CREATININE 0.69  --  0.57 0.55   Estimated Creatinine Clearance: 50.7 ml/min (by C-G formula based on Cr of 0.55).  Microbiology: Recent Results (from the past 720 hour(s))  URINE CULTURE     Status: None   Collection Time    07/13/12  8:35 AM      Result Value Range Status   Specimen Description URINE, RANDOM   Final   Special Requests NONE   Final   Culture  Setup Time 07/13/2012 18:05   Final   Colony Count 8,000 COLONIES/ML   Final   Culture INSIGNIFICANT GROWTH   Final   Report Status 07/14/2012 FINAL   Final  URINE CULTURE     Status: None   Collection Time    08/04/12 11:03 PM      Result Value Range Status   Specimen Description URINE, CATHETERIZED   Final   Special  Requests NONE   Final   Culture  Setup Time 08/05/2012 07:00   Final   Colony Count NO GROWTH   Final   Culture NO GROWTH   Final   Report Status 08/06/2012 FINAL   Final  CULTURE, BLOOD (ROUTINE X 2)     Status: None   Collection Time    08/05/12  4:25 AM      Result Value Range Status   Specimen Description BLOOD LEFT ARM   Final   Special Requests BOTTLES DRAWN AEROBIC ONLY 1 CC   Final   Culture  Setup Time 08/05/2012 08:32   Final   Culture     Final   Value:        BLOOD CULTURE RECEIVED NO GROWTH TO DATE CULTURE WILL BE HELD FOR 5 DAYS BEFORE ISSUING A FINAL NEGATIVE REPORT   Report Status PENDING   Incomplete  CULTURE, BLOOD (ROUTINE X 2)     Status: None   Collection Time    08/05/12  4:30 AM      Result Value Range Status   Specimen Description BLOOD LEFT WRIST   Final   Special Requests BOTTLES DRAWN AEROBIC ONLY 2CC   Final   Culture  Setup Time 08/05/2012 08:32   Final   Culture  Final   Value:        BLOOD CULTURE RECEIVED NO GROWTH TO DATE CULTURE WILL BE HELD FOR 5 DAYS BEFORE ISSUING A FINAL NEGATIVE REPORT   Report Status PENDING   Incomplete  GRAM STAIN     Status: None   Collection Time    08/05/12  8:50 AM      Result Value Range Status   Specimen Description ABDOMEN JP FLUID   Final   Special Requests Normal   Final   Gram Stain     Final   Value: CYTOSPIN     WBC PRESENT,BOTH PMN AND MONONUCLEAR     YEAST     GRAM NEGATIVE RODS     GRAM POSITIVE COCCI IN CHAINS IN PAIRS   Report Status 08/05/2012 FINAL   Final  WOUND CULTURE     Status: None   Collection Time    08/05/12  8:50 AM      Result Value Range Status   Specimen Description DRAINAGE JP   Final   Special Requests Normal   Final   Gram Stain     Final   Value: WBC PRESENT,BOTH PMN AND MONONUCLEAR     YEAST     GRAM NEGATIVE RODS     GRAM POSITIVE COCCI     IN PAIRS IN CHAINS Gram Stain Report Called to,Read Back By and Verified With: Gram Stain Report Called to,Read Back By and  Verified With: J CONRAD RN 1000 08/05/12 BY SHUEA Performed by [facility] CYTOSPIN SLIDE   Culture PENDING   Incomplete   Report Status PENDING   Incomplete  ANAEROBIC CULTURE     Status: None   Collection Time    08/05/12  8:50 AM      Result Value Range Status   Specimen Description DRAINAGE JP   Final   Special Requests Normal   Final   Gram Stain     Final   Value: NO WBC SEEN     NO SQUAMOUS EPITHELIAL CELLS SEEN     NO ORGANISMS SEEN   Culture     Final   Value: NO ANAEROBES ISOLATED; CULTURE IN PROGRESS FOR 5 DAYS   Report Status PENDING   Incomplete    Anti-infectives   Start     Dose/Rate Route Frequency Ordered Stop   08/05/12 1200  micafungin (MYCAMINE) 100 mg in sodium chloride 0.9 % 100 mL IVPB     100 mg 100 mL/hr over 1 Hours Intravenous Daily 08/05/12 1113     08/05/12 0500  piperacillin-tazobactam (ZOSYN) IVPB 3.375 g     3.375 g 12.5 mL/hr over 240 Minutes Intravenous 3 times per day 08/05/12 0408        Assessment: 66 y/oF with several recent hospitalizations 3/12-3/14 for supraumbilical hernia repair and gastric biopsy, then admitted with gastric perforation s/p repair on 07/13/12 and home on TPN 07/26/12 (started 3/24). She presents with SOB (+PE) and abd pain with CT revealing recurrent leak.  Pt on bowel rest and TNA and antibiotics for abdominal coverage.  Day #4 Zosyn (per pharmacy dosing) and Micafungin for intra-abdominal abscess/peritonitis.  Afebrile, WBC elevated but improving, SCr stable (CrCl~50 ml/min).  No growth on cultures yet.  Goal of Therapy:  eradication of infection; doses adjusted per renal clearance  Plan:  Continue Zosyn 3.375g IV q8h (4 hour infusion time).  Clance Boll 08/08/2012,8:22 AM

## 2012-08-08 NOTE — Progress Notes (Signed)
CARE MANAGEMENT NOTE 08/08/2012  Patient:  Cynthia Bowen, Cynthia Bowen   Account Number:  [id number]  Date Initiated:  08/05/2012  Documentation initiated by:  DAVIS,RHONDA  Subjective/Objective Assessment:   history of recent gastric perforation and repair dcd to home earlier this month, represented with large lllPE     Action/Plan:   iv tpn chroniclly, lives with children tpn managed at home with [facility] and nepew who is trained on the tpn.   Anticipated DC Date:  08/11/2012   Anticipated DC Plan:  HOME W HOME HEALTH SERVICES  In-house referral  NA      DC Planning Services  CM consult      [facility] Choice  HOME HEALTH   Choice offered to / List presented to:  NA   DME arranged  NA      DME agency  NA     HH arranged  NA      HH agency  [facility]   Status of service:  In process, will continue to follow Medicare Important Message given?  NA - LOS <3 / Initial given by admissions (If response is "NO", the following Medicare IM given date fields will be blank) Date Medicare IM given:   Date Additional Medicare IM given:    Discharge Disposition:    Per UR Regulation:  Reviewed for med. necessity/level of care/duration of stay  If discussed at Long Length of Stay Meetings, dates discussed:    Comments:  [id number] Earlene Plater, RN, BSN, CCM:  CHART REVIEWED AND UPDATED.  Next chart review due on [id number]. NO DISCHARGE NEEDS PRESENT AT THIS TIME. patient continues to run low grade temp, elevated wbc, and abd pain, bp fluctuantes. CASE MANAGEMENT [phone]   [id number] Earlene Plater, RN, BSN, CCM:  CHART REVIEWED AND UPDATED.  Next chart review due on [id number]. NO DISCHARGE NEEDS PRESENT AT THIS TIME. CASE MANAGEMENT [phone]

## 2012-08-08 NOTE — Progress Notes (Signed)
OT Cancellation Note  Patient Details Name: Cynthia Bowen MRN: [id number] DOB: 1945-08-09   Cancelled Treatment:    Reason Eval/Treat Not Completed: Medical issues which prohibited therapy.  Surgery is continuing to evaluate patient for leak.  She is on a heparin drip and has CP.    Kimaya Whitlatch 08/08/2012, 10:00 AM Marica Otter, OTR/L [phone] 08/08/2012

## 2012-08-08 NOTE — Clinical Documentation Improvement (Signed)
SEPSIS DOCUMENTATION QUERY  THIS DOCUMENT IS NOT A PERMANENT PART OF THE MEDICAL RECORD  TO RESPOND TO THE THIS QUERY, FOLLOW THE INSTRUCTIONS BELOW:  1. If needed, update documentation for the patient's encounter via the notes activity.  2. Access this query again and click edit on the In [employer].  3. After updating, or not, click F2 to complete all highlighted (required) fields concerning your review. Select "additional documentation in the medical record" OR "no additional documentation provided".  4. Click Sign note button.  5. The deficiency will fall out of your In Basket *Please let us know if you are not able to complete this workflow by phone or e-mail (listed below).  Please update your documentation within the medical record to reflect your response to this query.                                                                                    08/08/12  Dear Dr. Leroy Sea Marton Redwood,  In a better effort to capture your patient's severity of illness, reflect appropriate length of stay and utilization of resources, a review of the patient medical record has revealed the following indicators.    Based on your clinical judgment, please clarify and document in a progress note and/or discharge summary the clinical condition associated with the following supporting information:  In responding to this query please exercise your independent judgment.  The fact that a query is asked, does not imply that any particular answer is desired or expected.    According to pn 08/08/12 pt with SIRS  Clarification Needed   Clarify the underlying cause of the SIRS         OR   Please clarify if SIRS in setting of PE, peritonitis, culture with GNR, and severe protein calorie malnutrition can be further specified as one of the diagnoses listed below and document in pn or d/c summary.   Possible Clinical Conditions?  Septicemia / Sepsis Severe Sepsis Neutropenic  Sepsis  Septic Shock  Other Condition __________________ Cannot clinically Determine _______________    Risk Factors: PE, Severe Protein cal malnutrition, Encephalopathy, PNA, peritonitis, Pericarditis,   Presenting Signs and Symptoms: PN: 4/18 SIRs with mild hypotension  Diagnostics: B/Ps: 95/45, 94/46, 77/47, 84/42, 97/55, 106/54 1 Resp: 21, 24, 27 HR: 103, 110, 102,    Component      WBC  Latest Ref Rng      4.0 - 10.5 K/uL  08/04/2012      28.8 (H)  08/05/2012     4:47 AM 29.0 (H)   Component      WBC  Latest Ref Rng      4.0 - 10.5 K/uL  08/06/2012     3:45 AM 19.1 (H)   Cultures: 08/05/2012 FINAL Culture on:   ABDOMEN JP FLUID   CYTOSPIN   WBC PRESENT,BOTH PMN AND MONONUCLEAR   YEAST   GRAM NEGATIVE RODS   GRAM POSITIVE COCCI IN CHAINS IN PAIRS   Treatment: Monitoring  Reviewed:  no additional documentation provided  Thank You,  Enis Slipper  RN, BSN, MSN/Inf, CCDS Clinical Documentation Specialist [facility] Dept Pager: [phone] /  E-mail: [email or web address]  Health Information Management [place]

## 2012-08-08 NOTE — Progress Notes (Signed)
  Subjective: No new changes Still with intermittent sharp abdominal pain Passing flatus  Objective: Vital signs in last 24 hours: Temp:  [97.7 F (36.5 C)-99.5 F (37.5 C)] 99.5 F (37.5 C) (04/18 0400) Resp:  [12-24] 24 (04/18 0400) BP: (100-116)/(47-71) 108/64 mmHg (04/18 0400) SpO2:  [93 %-100 %] 96 % (04/18 0400) Weight:  [100 lb 15.5 oz (45.8 kg)] 100 lb 15.5 oz (45.8 kg) (04/18 0500) Last BM Date: 08/07/12  Intake/Output from previous day: 04/17 0701 - 04/18 0700 In: 3994.5 [I.V.:1317; IV Piggyback:1350; TPN:1327.5] Out: 3030 [Urine:2950; Drains:80] Intake/Output this shift:    Abdomen soft, non distended, mildly tender, drain with some contrast  Lab Results:   Recent Labs  08/07/12 0410 08/08/12 0600  WBC 17.8* 16.6*  HGB 7.5* 7.5*  HCT 22.0* 21.5*  PLT 437* 511*   BMET  Recent Labs  08/07/12 0410 08/08/12 0600  NA 139 137  K 3.1* 4.2  CL 107 105  CO2 26 25  GLUCOSE 119* 114*  BUN 17 11  CREATININE 0.57 0.55  CALCIUM 8.6 8.5   PT/INR No results found for this basename: LABPROT, INR,  in the last 72 hours ABG No results found for this basename: PHART, PCO2, PO2, HCO3,  in the last 72 hours  Studies/Results: Ct Head Wo Contrast  08/06/2012  *RADIOLOGY REPORT*  Clinical Data: Altered mental status.  CT HEAD WITHOUT CONTRAST  Technique:  Contiguous axial images were obtained from the base of the skull through the vertex without contrast.  Comparison: None.  Findings: There is no evidence of acute intracranial abnormality including infarction, hemorrhage, midline shift, abnormal extra- axial fluid collection, mass, mass effect, midline shift or hydrocephalus is identified.  There is no pneumocephalus. The calvarium is intact.  Imaged paranasal sinuses and mastoid air cells are clear.  IMPRESSION: No acute finding.   Original Report Authenticated By: Holley Dexter, M.D.    Dg Chest Port 1 View  08/07/2012  *RADIOLOGY REPORT*  Clinical Data:  Follow-up left lower lobe infiltrate.  Pulmonary embolism.  PORTABLE CHEST - 1 VIEW  Comparison: CT 08/05/2012.  Radiographs 08/04/2012.  Findings: 0512 hours.  The right arm PICC is unchanged at the SVC right atrial level.  There is stable cardiomegaly.  There has been some worsening of the left lower lobe air space disease.  There are probable small bilateral pleural effusions.  No pneumothorax is seen.  IMPRESSION: Slight worsening of left lower lobe air space disease.  This could reflect pneumonia or pulmonary infarction given the prior CT findings.   Original Report Authenticated By: Carey Bullocks, M.D.     Anti-infectives: Anti-infectives   Start     Dose/Rate Route Frequency Ordered Stop   08/05/12 1200  micafungin (MYCAMINE) 100 mg in sodium chloride 0.9 % 100 mL IVPB     100 mg 100 mL/hr over 1 Hours Intravenous Daily 08/05/12 1113     08/05/12 0500  piperacillin-tazobactam (ZOSYN) IVPB 3.375 g     3.375 g 12.5 mL/hr over 240 Minutes Intravenous 3 times per day 08/05/12 0408        Assessment/Plan: s/p * No surgery found *  Gastric leak s/p biopsy then attempts at closure  Will need to continue bowel rest, TNA, etc.  Drainage in bulb is decreasing.  LOS: 4 days    Cynthia Bowen A 08/08/2012

## 2012-08-08 NOTE — Progress Notes (Signed)
ANTICOAGULATION CONSULT NOTE - Initial Consult  Pharmacy Consult for Lovenox Indication: pulmonary embolus  Allergies  Allergen Reactions  . Avelox (Moxifloxacin Hcl In Nacl) Nausea And Vomiting  . Alendronate Sodium Nausea And Vomiting  . Aspirin Nausea Only  . Codeine Nausea And Vomiting  . Doxycycline   . Fluconazole   . Hydrocodone Nausea And Vomiting    GI distress  . Neurontin (Gabapentin) Other (See Comments)    Mood changes   . Nsaids Other (See Comments)    Severe gastritis & perforation - avoid NSAIDs when possible  . Sertraline Hcl Other (See Comments)    Hallucinations   . Sulfa Antibiotics Rash    Patient Measurements: Height: 5\' 5"  (165.1 cm) Weight: 100 lb 15.5 oz (45.8 kg) IBW/kg (Calculated) : 57  Vital Signs: Temp: 97.5 F (36.4 C) (04/18 0800) Temp src: Oral (04/18 0800) BP: 126/56 mmHg (04/18 0727) Pulse Rate: 70 (04/18 0727)  Labs:  Recent Labs  08/05/12 1556  08/06/12 0345 08/06/12 1205  08/07/12 0220 08/07/12 0410 08/07/12 1010 08/08/12 0600 08/08/12 0740  HGB  --   < > 7.7* 7.6*  --   --  7.5*  --  7.5*  --   HCT  --   < > 22.8* 22.1*  --   --  22.0*  --  21.5*  --   PLT  --   --  458*  --   --   --  437*  --  511*  --   HEPARINUNFRC  --   < > 0.10* 0.27*  < > 0.48  --  0.50 0.28*  --   CREATININE  --   --  0.69  --   --   --  0.57  --  0.55  --   TROPONINI <0.30  --   --   --   --   --   --   --   --  <0.30  < > = values in this interval not displayed.  Estimated Creatinine Clearance: 50.7 ml/min (by C-G formula based on Cr of 0.55).   Medical History: Past Medical History  Diagnosis Date  . COPD (chronic obstructive pulmonary disease)   . Pneumonia 12-2011  . GERD (gastroesophageal reflux disease)   . Headache   . Arthritis     osteoarthritis  . Allergy   . Depression   . Neuromuscular disorder   . Osteoporosis   . Bronchitis     CURRENTLY AS OF 06/30/12 - HAS COUGH AND FINISHED ANTIBIOTIC FOR BRONCHITIS  .  Fibromyalgia   . Pain     ABDOMINAL PAIN AND NAUSEA  . Pain     SOMETIMES PAIN RIGHT EAR AND NECK--STATES CAUSED BY A "LUMP" ON BACK OF EAR--USES KENALOG CREAM TOPICALLY AS NEEDED.     Assessment: 59 y/oF with several recent hospitalizations for multiple abdominal surgeries readmitted with new bowel leak and bilateral PE.  IV heparin started 4/15.  Now that there is no plan for surgery for gastric leak, MD asking pharmacy to switch heparin drip to Lovenox with potential of switching to Xarelto once patient tolerating an oral diet.  Currently NPO with plans for continued bowel rest with TNA at this time.  Renal function stable, CrCl~ 50 ml/min  Low body weight, weight 45 kg  Hgb low but stable, no bleeding noted  Goal of Therapy:  Anti-Xa level 0.6-1.2 units/ml 4hrs after LMWH dose given Monitor platelets by anticoagulation protocol: Yes   Plan:   Discontinue  heparin drip at 1100.    Start Lovenox 45 mg IV q12h at 1200.    Timing of heparin drip end and Lovenox start were discussed with RN.  CBC ordered daily.  Clance Boll 08/08/2012,11:06 AM

## 2012-08-09 ENCOUNTER — Inpatient Hospital Stay (HOSPITAL_COMMUNITY): Payer: Medicare Other

## 2012-08-09 DIAGNOSIS — R630 Anorexia: Secondary | ICD-10-CM

## 2012-08-09 LAB — COMPREHENSIVE METABOLIC PANEL
AST: 12 U/L (ref 0–37)
CO2: 26 mEq/L (ref 19–32)
Calcium: 8.8 mg/dL (ref 8.4–10.5)
Creatinine, Ser: 0.57 mg/dL (ref 0.50–1.10)
GFR calc Af Amer: >90 mL/min (ref >90)
GFR calc non Af Amer: >90 mL/min (ref >90)
Glucose, Bld: 112 mg/dL — ABNORMAL HIGH (ref 70–99)

## 2012-08-09 LAB — OSMOLALITY: Osmolality: 282 mOsm/kg (ref 275–300)

## 2012-08-09 LAB — CBC
MCH: 27.1 pg (ref 26.0–34.0)
MCV: 79.3 fL (ref 78.0–100.0)
Platelets: 582 10*3/uL — ABNORMAL HIGH (ref 150–400)
RBC: 2.66 MIL/uL — ABNORMAL LOW (ref 3.87–5.11)

## 2012-08-09 LAB — GLUCOSE, CAPILLARY
Glucose-Capillary: 119 mg/dL — ABNORMAL HIGH (ref 70–99)
Glucose-Capillary: 133 mg/dL — ABNORMAL HIGH (ref 70–99)
Glucose-Capillary: 119 mg/dL — ABNORMAL HIGH (ref 70–99)
Glucose-Capillary: 108 mg/dL — ABNORMAL HIGH (ref 70–99)

## 2012-08-09 MED ORDER — CLINIMIX E/DEXTROSE (5/20) 5 % IV SOLN
INTRAVENOUS | Status: AC
  Administered 2012-08-09: 18:00:00 via INTRAVENOUS
  Filled 2012-08-09: qty 2000

## 2012-08-09 MED ORDER — PROMETHAZINE HCL 25 MG/ML IJ SOLN
12.5000 mg | Freq: Four times a day (QID) | INTRAMUSCULAR | Status: DC | PRN
  Administered 2012-08-09 (×2): 12.5 mg via INTRAVENOUS
  Filled 2012-08-09 (×2): qty 1

## 2012-08-09 MED ORDER — HYDROMORPHONE HCL PF 1 MG/ML IJ SOLN
1.0000 mg | Freq: Once | INTRAMUSCULAR | Status: AC
  Administered 2012-08-09: 1 mg via INTRAVENOUS
  Filled 2012-08-09: qty 1

## 2012-08-09 MED ORDER — SODIUM CHLORIDE 0.9 % IJ SOLN
10.0000 mL | INTRAMUSCULAR | Status: DC | PRN
  Administered 2012-08-12 – 2012-08-13 (×2): 10 mL
  Administered 2012-08-14: 20 mL

## 2012-08-09 MED ORDER — POTASSIUM CHLORIDE IN NACL 20-0.9 MEQ/L-% IV SOLN
INTRAVENOUS | Status: DC
  Administered 2012-08-09: 08:00:00 via INTRAVENOUS

## 2012-08-09 NOTE — Progress Notes (Signed)
PT Cancellation Note  Patient Details Name: Cynthia Bowen MRN: [id number] DOB: 11/16/1945   Cancelled Treatment:    Reason Eval/Treat Not Completed: Pain limiting ability to participate.  Pt states that she is having too much pain in abdomen and chest, but will try tomorrow.  Will follow.    Thanks,    Vista Deck 08/09/2012, 3:04 PM

## 2012-08-09 NOTE — Progress Notes (Signed)
CSW received referral regarding potential disposition needs. Pt pending pt eval. CSW will inform unit csw of potential needs along with considering ltac per chart review.  Catha Gosselin, LCSWA  [phone] .08/09/2012 1621pm

## 2012-08-09 NOTE — Progress Notes (Signed)
  Subjective: No pain this morning  Objective: Vital signs in last 24 hours: Temp:  [97.5 F (36.4 C)-98.7 F (37.1 C)] 97.8 F (36.6 C) (04/19 0400) Pulse Rate:  [70-98] 98 (04/19 0400) Resp:  [12-27] 14 (04/19 0600) BP: (107-189)/(54-98) 107/54 mmHg (04/19 0600) SpO2:  [96 %-99 %] 98 % (04/19 0600) Weight:  [99 lb 6.8 oz (45.1 kg)] 99 lb 6.8 oz (45.1 kg) (04/19 0000) Last BM Date: 08/08/12  Intake/Output from previous day: 04/18 0701 - 04/19 0700 In: 3388.6 [I.V.:1756.1; IV Piggyback:62.5; TPN:1570] Out: 2780 [Urine:2675; Drains:105] Intake/Output this shift:    Abdomen soft, midline wound closed, drain bilous Minimal tenderness  Lab Results:   Recent Labs  08/08/12 0600 08/09/12 0349  WBC 16.6* 13.3*  HGB 7.5* 7.2*  HCT 21.5* 21.1*  PLT 511* 582*   BMET  Recent Labs  08/08/12 0600 08/09/12 0349  NA 137 129*  K 4.2 4.1  CL 105 97  CO2 25 26  GLUCOSE 114* 112*  BUN 11 10  CREATININE 0.55 0.57  CALCIUM 8.5 8.8   PT/INR No results found for this basename: LABPROT, INR,  in the last 72 hours ABG No results found for this basename: PHART, PCO2, PO2, HCO3,  in the last 72 hours  Studies/Results: Dg Chest Port 1 View  08/09/2012  *RADIOLOGY REPORT*  Clinical Data: Pulmonary embolism.  Short of breath.  Cough.  PORTABLE CHEST - 1 VIEW  Comparison: 08/07/2012 radiograph.  08/05/2012 CT.  Findings: Right upper extremity PICC unchanged.  Cardiomegaly is partially obscured by airspace disease at the left base.  The left basilar airspace disease and probable effusion appears little changed compared to prior exam.  Right pleural effusion is present. Suspect underlying emphysema.  Partially visualized cervical ACDF. No pneumothorax.  IMPRESSION: No interval change.  Left basilar collapse / consolidation with probable left pleural effusion.   Original Report Authenticated By: Andreas Newport, M.D.     Anti-infectives: Anti-infectives   Start     Dose/Rate Route  Frequency Ordered Stop   08/05/12 1200  micafungin (MYCAMINE) 100 mg in sodium chloride 0.9 % 100 mL IVPB     100 mg 100 mL/hr over 1 Hours Intravenous Daily 08/05/12 1113     08/05/12 0500  piperacillin-tazobactam (ZOSYN) IVPB 3.375 g     3.375 g 12.5 mL/hr over 240 Minutes Intravenous 3 times per day 08/05/12 0408        Assessment/Plan: s/p * No surgery found *  gastric leak  Drainage stable.  Continuing on bowel rest and TNA May be able to try liquids soon.  LOS: 5 days    Fantashia Shupert A 08/09/2012

## 2012-08-09 NOTE — Progress Notes (Signed)
[facility]                                                                                Patient Demographics  Cynthia Bowen, is a 67 y.o. female, DOB - April 07, 1946, WGN:[id number], HQI:[id number]  Admit date - 08/04/2012  Admitting Physician Eduard Clos, MD  Outpatient Primary MD for the patient is [name], [name], MD  LOS - 5   Chief Complaint  Patient presents with  . Chest Pain  . Abdominal Pain        Assessment & Plan   Summary   67 year old [place] American female who has had a complex abdominal surgical history in the last few months, according to old chart review and discussions with surgery patient had initial incarcerated epigastric hernia repair and 07/02/2012, this was complicated by gastric perforation with abscess/peritonitis, status post omental patch repair x2 in March of 2014, she required PICC line placement for TNA, she had an abdominal drain placed, and discharged on 07/26/2012 by general surgery, she came back on 08/05/2012 with chest and abdominal pain, in the ER workup was consistent of acute bilateral PEs she was placed on heparin drip, also she had leukocytosis and an abdominal CT was suggestive of possible another bowel leak. Hospitalist were requested to admit the patient, general surgery on consult.    I have requested Dr. Daphine Deutscher to consider transferring her care under general surgery service on 08/06/2012, her primary problem again continues to be postop surgical complication, patient was under surgery service for about 3 weeks few days ago, and this problem continues to be addressed by general surgery.   Dr. Daphine Deutscher has graciously accepted her under his care, Hospitalist team will follow the patient throughout her Hospital stay. All PE and Medical issues will be addressed by our team.       1. Bilateral PE in the setting of decreased activity, dehydration, recent multiple abdominal surgeries. Patient also has evidence of  new bowel leak along with leukocytosis and may need another surgical procedure, for now I agree with IV heparin drip. Her upper and lower extremity Dopplers are negative, she has some blood in her phlegm right now but I think this is due to her underlying PE, H&H remained stable and she is clinically better, appreciate PC CM input, as no surgery is planned Will switch from heparin drip to Lovenox on 08/08/2012, once taking oral diet she can be switched to xaralto under pharmacy consult.       2. Abdominal pain with recent 3 abdominal surgeries in March of 2014 by general surgery including incarcerated epigastric hernia repair, post biopsy gastric perforation, abdominal abscess and peritonitis of omental patch repair all in March of 2014. For now Zosyn to be continued, new CT abdomen showing possible bowel leak.   Continue bowel rest, IV Zosyn, IV micafungin as discussed with Dr. Orvan Falconer infectious disease as she is allergic to fluconazole, bowel rest for TNA . Will defer further management of this issue to [facility].       3. Leukocytosis reactionary due to #2 above. Monitor clinically. CBC shows improvement in leukocytosis with above management.     4. COPD currently at baseline  exam not consistent with any exacerbation as needed oxygen and nebulizer treatments.     5. Protein calorie malnutrition. TNA per pharmacy     6. SIRs with mild hypotension. Resolved after IV fluid bolus, Continue TNA, normal saline bolus if needed, we'll transfuse PRBCs early if becomes hypotensive. However currently she is asymptomatic with good urine output. She is a slender lady and baseline blood pressures could be low.     7. Anemia. Has anemia of chronic disease, anemia panel is inconclusive, IV PPI, no history suggestive of blood in stool or melena, H&H is stable once accounted for dilution from IV fluids and TNA, if patient becomes hypotensive I might transfuse her around hemoglobin of 7.5. Type screen  has been done H&H being monitored.     8.Mild delirium on 08/06/2012 - due to acute illness, hospital setting, narcotics. No focal neurological deficits, CT Head stable, reduced narcotics, delirium resolved, now appears to be at baseline.    9. Mild left-sided pleuritic chest pain on 08/08/2012. was due to PE, bedside EKG unremarkable, negative 3 sets of troponins, resolved after one dose of Toradol,  Monitor.    10. Mild hyponatremia on 08/09/2012. 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 Repeat BMP in the morning.     Code Status: Full  Family Communication: patient  Disposition Plan: TBD, requested PT evaluation and social work evaluation he did patient would be ideally suited for LTAC   Procedures CT angio chest, CT Abd-Pelvis, previous PICC,CXR  Upper and lower extremity Dopplers negative  Consults  [facility]. ID Dr. Orvan Falconer over the phone, PCCM   DVT Prophylaxis    Heparin   Lab Results  Component Value Date   PLT 582* 08/09/2012    Medications  Scheduled Meds: . chlorhexidine  15 mL Mouth Rinse BID  . enoxaparin (LOVENOX) injection  1 mg/kg Subcutaneous Q12H  . insulin aspart  0-9 Units Subcutaneous Q6H  . micafungin (MYCAMINE) IV  100 mg Intravenous Daily  . pantoprazole (PROTONIX) IV  40 mg Intravenous Q12H  . piperacillin-tazobactam (ZOSYN)  IV  3.375 g Intravenous Q8H  . sodium chloride  3 mL Intravenous Q12H   Continuous Infusions: . Marland KitchenTPN (CLINIMIX-E) Adult 65 mL/hr at 08/08/12 1700  . Marland KitchenTPN (CLINIMIX-E) Adult    . 0.9 % NaCl with KCl 20 mEq / L 75 mL/hr at 08/09/12 0732  . fat emulsion 240 mL (08/08/12 1659)   PRN Meds:.acetaminophen, acetaminophen, albuterol, diphenhydrAMINE, morphine injection, ondansetron (ZOFRAN) IV, ondansetron, promethazine  Antibiotics      Anti-infectives   Start     Dose/Rate Route Frequency Ordered Stop   08/05/12 1200  micafungin (MYCAMINE) 100 mg in sodium chloride 0.9 % 100 mL IVPB     100 mg 100 mL/hr over 1 Hours Intravenous Daily 08/05/12 1113     08/05/12 0500  piperacillin-tazobactam (ZOSYN) IVPB 3.375 g     3.375 g 12.5 mL/hr over 240 Minutes Intravenous 3 times per day 08/05/12 0408         Time Spent in minutes   45   Jaquesha Boroff K M.D on 08/09/2012 at 8:36 AM  Between 7am to 7pm - Pager - [phone]  After 7pm go to [email or web address] - password [email or web address]  And look for the night coverage person covering for me after hours  [facility] Office  [phone]    Subjective:   Cynthia Bowen today has, No headache, resolved left sided chest pain No SOB now , improved but + abdominal pain  - No Nausea, No new weakness tingling or numbness, No Cough .   Objective:   Filed Vitals:   08/09/12 0400 08/09/12 0445 08/09/12 0600 08/09/12 0753  BP: 189/84 134/95 107/54 115/60  Pulse: 98     Temp: 97.8 F (36.6 C)     TempSrc: Oral     Resp: 23 17 14 15   Height:      Weight:      SpO2: 99% 98% 98% 99%    Wt Readings from Last 3 Encounters:  08/09/12 45.1 kg (99 lb 6.8 oz)  07/14/12 44.3 kg (97 lb 10.6 oz)  07/14/12 44.3 kg (97 lb 10.6 oz)     Intake/Output Summary (Last 24 hours) at 08/09/12 0836 Last data filed at 08/09/12 0800  Gross per 24 hour  Intake 3397.75 ml  Output   2445 ml  Net 952.75 ml    Exam Awake , Alert, Oriented X 3, No new F.N deficits, Normal affect [place].AT,PERRAL Supple Neck,No JVD, No cervical lymphadenopathy appriciated.  Symmetrical Chest wall movement, Good air movement bilaterally, CTAB RRR,No Gallops,Rubs or new Murmurs, No Parasternal Heave Hypo active B.Sounds, Abd Soft but has mild generalized tenderness, Abd drain in place, No organomegaly appriciated, No rebound - guarding or rigidity.   No Cyanosis, Clubbing or edema, No new Rash or bruise      Data Review   Micro Results  Recent Results (from the past 240 hour(s))  URINE CULTURE     Status: None   Collection Time    08/04/12 11:03 PM      Result Value Range Status   Specimen Description URINE, CATHETERIZED   Final   Special Requests NONE   Final   Culture  Setup Time 08/05/2012 07:00   Final   Colony Count NO GROWTH   Final   Culture NO GROWTH   Final   Report Status 08/06/2012 FINAL   Final  CULTURE, BLOOD (ROUTINE X 2)     Status: None   Collection Time    08/05/12  4:25 AM      Result Value Range Status   Specimen Description BLOOD LEFT ARM   Final   Special Requests BOTTLES DRAWN AEROBIC ONLY 1 CC   Final   Culture  Setup Time 08/05/2012 08:32   Final   Culture     Final   Value:        BLOOD CULTURE RECEIVED NO GROWTH TO DATE CULTURE WILL BE HELD FOR 5 DAYS BEFORE ISSUING A FINAL NEGATIVE REPORT   Report Status PENDING   Incomplete  CULTURE, BLOOD (ROUTINE X 2)     Status: None   Collection Time    08/05/12  4:30 AM      Result Value Range Status   Specimen Description BLOOD LEFT WRIST   Final   Special Requests BOTTLES DRAWN AEROBIC ONLY 2CC   Final   Culture  Setup Time 08/05/2012 08:32   Final   Culture     Final   Value:        BLOOD CULTURE RECEIVED NO GROWTH TO DATE CULTURE WILL BE HELD FOR 5 DAYS BEFORE ISSUING A FINAL NEGATIVE REPORT   Report Status PENDING   Incomplete  GRAM STAIN     Status: None   Collection Time    08/05/12  8:50 AM      Result Value Range Status   Specimen Description ABDOMEN JP FLUID   Final   Special Requests Normal   Final   Gram Stain     Final   Value: CYTOSPIN     WBC PRESENT,BOTH PMN AND MONONUCLEAR     YEAST     GRAM NEGATIVE RODS     GRAM POSITIVE COCCI IN CHAINS IN PAIRS   Report Status 08/05/2012 FINAL   Final  WOUND CULTURE     Status: None   Collection Time    08/05/12  8:50 AM      Result Value Range Status   Specimen Description DRAINAGE JP   Final   Special Requests Normal   Final   Gram Stain      Final   Value: WBC PRESENT,BOTH PMN AND MONONUCLEAR     YEAST     GRAM NEGATIVE RODS     GRAM POSITIVE COCCI     IN PAIRS IN CHAINS Gram Stain Report Called to,Read Back By and Verified With: Gram Stain Report Called to,Read Back By and Verified With: J CONRAD RN 1000 08/05/12 BY SHUEA Performed by [facility] CYTOSPIN SLIDE   Culture PENDING   Incomplete   Report Status PENDING   Incomplete  ANAEROBIC CULTURE     Status: None   Collection Time    08/05/12  8:50 AM      Result Value Range Status   Specimen Description DRAINAGE JP   Final   Special Requests Normal   Final   Gram Stain     Final   Value: NO WBC SEEN     NO SQUAMOUS EPITHELIAL CELLS SEEN     NO ORGANISMS SEEN   Culture     Final   Value: NO ANAEROBES ISOLATED; CULTURE IN PROGRESS FOR 5 DAYS   Report Status PENDING   Incomplete       Ct Angio Chest Pe W/cm &/or Wo Cm  08/05/2012  *RADIOLOGY REPORT*  Clinical Data: Shortness of breath.  Sharp left chest pain.  CT ANGIOGRAPHY CHEST  Technique:  Multidetector CT imaging of the chest using the standard protocol during bolus administration of intravenous contrast. Multiplanar reconstructed images including MIPs were obtained and reviewed to evaluate the vascular anatomy.  Contrast: 80mL OMNIPAQUE IOHEXOL 350 MG/ML SOLN  Comparison: CT scan of the chest dated 04/10/2012 and CT scan of the abdomen dated 07/13/2012  Findings: There is an extensive large pulmonary embolus in the pulmonary artery to the left lower lobe.  There is secondary extensive left lower lobe consolidation and interstitial accentuation which may represent pulmonary infarction. Tiny emboli in the proximal portion of the pulmonary artery to the left upper lobe.  This is superimposed on diffuse emphysema.  Heart size is normal. No hilar mediastinal adenopathy.  The right lung is clear except for some slight atelectasis at the right base.  There is a small chronic pericardial effusion.  No acute osseous  abnormality.  The thoracic aorta is normal except for slight calcification. Origins of the brachiocephalic vessels are widely patent.  IMPRESSION:  1.  Large extensive pulmonary embolus in the left lower lobe pulmonary artery.  Smaller emboli in the left upper lobe pulmonary artery. 2.  Possible infarction in the left lower lobe. 3.  Diffuse emphysema.   Original Report Authenticated By: Francene Boyers, M.D.    Ct Abdomen Pelvis W Contrast  08/05/2012  *RADIOLOGY REPORT*  Clinical Data: Abdominal pain.  Recent gastric perforation.  CT ABDOMEN AND PELVIS WITH CONTRAST  Technique:  Multidetector CT imaging of the abdomen and pelvis was performed following the standard protocol during bolus administration of intravenous contrast.  Contrast: 80mL OMNIPAQUE IOHEXOL 350 MG/ML SOLN  Comparison: CT scan dated 07/13/2012  Findings: Drain is present in the upper abdomen anterior to the antrum of the stomach.  There are a few small extraluminal air fluid collections at that site, slightly diminished. There is an area of what is probably residual contrast and a there are fluid collection posterior to the left lobe of the liver seen on image number 26 of series 7.  Thickening of the mucosa of the distal stomach is unchanged. There are a few extraluminal air bubbles anterior to the distal antrum.  I cannot exclude a recurrent perforation based on these images.  Liver, spleen, pancreas, adrenal glands, and kidneys appear stable. Bilateral renal cysts and right renal calculi are unchanged.  No dilated large or small bowel.  Stable 2.5 cm cyst on the right ovary.  IMPRESSION: Interval slight decrease in the fluid and air collections anterior to the stomach adjacent to the soft tissue drain. Persistent or recurrent contrast in the fluid collections anterior to the stomach.  This could represent a recurrent leak.  No other change.   Original Report Authenticated By: Francene Boyers, M.D.          Dg Chest Port 1  View  08/04/2012  *RADIOLOGY REPORT*  Clinical Data: Chest pain and cough.  PORTABLE CHEST - 1 VIEW  Comparison: Chest CT 07/13/2012.  Findings: The cardiac silhouette, mediastinal and hilar contours are stable.  Persistent left lower lobe process appears to be a combination of a pleural effusion and atelectasis.  Minimal right basilar atelectasis also.  Slight increased interstitial markings could be due to bronchitis or interstitial pneumonitis.  The right PICC line tip is in the distal SVC.  The bony thorax is intact.  IMPRESSION: Persistent left lower lobe process. Possible bronchitis or interstitial pneumonitis.   Original Report Authenticated By: [name], M.D.       CBC  Recent Labs Lab 08/04/12 2320 08/05/12 0447 08/06/12 0345 08/06/12 1205 08/07/12 0410 08/08/12 0600 08/09/12 0349  WBC 28.8* 29.0* 19.1*  --  17.8* 16.6* 13.3*  HGB 9.2* 8.8* 7.7* 7.6* 7.5* 7.5* 7.2*  HCT 27.2* 25.5* 22.8* 22.1* 22.0* 21.5* 21.1*  PLT 478* 503* 458*  --  437* 511* 582*  MCV 81.0 79.4 80.6  --  81.5 79.0 79.3  MCH 27.4 27.4 27.2  --  27.8 27.6 27.1  MCHC 33.8 34.5 33.8  --  34.1 34.9 34.1  RDW 15.5 15.3 15.7*  --  15.9* 15.9* 16.0*  LYMPHSABS 2.0  --  1.4  --   --   --   --   MONOABS 2.0*  --  1.5*  --   --   --   --   EOSABS 0.0  --  0.1  --   --   --   --   BASOSABS 0.0  --  0.0  --   --   --   --     Chemistries   Recent Labs Lab 08/05/12 0447 08/06/12 0345 08/07/12 0410 08/08/12 0600 08/09/12 0349  NA 134* 137 139 137 129*  K 3.7 3.7 3.1* 4.2 4.1  CL 98 104 107 105 97  CO2 25 27 26 25 26   GLUCOSE 95 126* 119* 114* 112*  BUN 18 23 17  11  10  CREATININE 0.62 0.69 0.57 0.55 0.57  CALCIUM 9.6 9.1 8.6 8.5 8.8  MG 1.7 2.0 1.8 1.8 1.6  AST 23 16 10 10 12   ALT 17 16 9 8 5   ALKPHOS 156* 134* 110 105 105  BILITOT 0.5 0.4 0.2* 0.2* 0.2*   ------------------------------------------------------------------------------------------------------------------ estimated creatinine  clearance is 49.9 ml/min (by C-G formula based on Cr of 0.57). ------------------------------------------------------------------------------------------------------------------ No results found for this basename: HGBA1C,  in the last 72 hours ------------------------------------------------------------------------------------------------------------------ No results found for this basename: CHOL, HDL, LDLCALC, TRIG, CHOLHDL, LDLDIRECT,  in the last 72 hours ------------------------------------------------------------------------------------------------------------------ No results found for this basename: TSH, T4TOTAL, FREET3, T3FREE, THYROIDAB,  in the last 72 hours ------------------------------------------------------------------------------------------------------------------ No results found for this basename: VITAMINB12, FOLATE, FERRITIN, TIBC, IRON, RETICCTPCT,  in the last 72 hours  Coagulation profile  Recent Labs Lab 08/05/12 0318  INR 1.28    No results found for this basename: DDIMER,  in the last 72 hours  Cardiac Enzymes  Recent Labs Lab 08/08/12 0740 08/08/12 1341 08/08/12 1900  TROPONINI <0.30 <0.30 <0.30   ------------------------------------------------------------------------------------------------------------------ No components found with this basename: POCBNP,

## 2012-08-09 NOTE — Progress Notes (Signed)
PARENTERAL NUTRITION CONSULT NOTE - Follow-up  Pharmacy Consult for TNA Indication: Recurrent gastric perforation, prolonged NPO status  Allergies  Allergen Reactions  . Avelox (Moxifloxacin Hcl In Nacl) Nausea And Vomiting  . Alendronate Sodium Nausea And Vomiting  . Aspirin Nausea Only  . Codeine Nausea And Vomiting  . Doxycycline   . Fluconazole   . Hydrocodone Nausea And Vomiting    GI distress  . Neurontin (Gabapentin) Other (See Comments)    Mood changes   . Nsaids Other (See Comments)    Severe gastritis & perforation - avoid NSAIDs when possible  . Sertraline Hcl Other (See Comments)    Hallucinations   . Sulfa Antibiotics Rash    Patient Measurements: Height: 5\' 5"  (165.1 cm) Weight: 99 lb 6.8 oz (45.1 kg) IBW/kg (Calculated) : 57 Usual Weight: ~44kg  Vital Signs: Temp: 97.8 F (36.6 C) (04/19 0400) Temp src: Oral (04/19 0400) BP: 107/54 mmHg (04/19 0600) Pulse Rate: 98 (04/19 0400) Intake/Output from previous day: 04/18 0701 - 04/19 0700 In: 3388.6 [I.V.:1756.1; IV Piggyback:62.5; TPN:1570] Out: 2780 [Urine:2675; Drains:105] Intake/Output from this shift:    Labs:  Recent Labs  08/07/12 0410 08/08/12 0600 08/09/12 0349  WBC 17.8* 16.6* 13.3*  HGB 7.5* 7.5* 7.2*  HCT 22.0* 21.5* 21.1*  PLT 437* 511* 582*     Recent Labs  08/07/12 0410 08/08/12 0600 08/09/12 0349  NA 139 137 129*  K 3.1* 4.2 4.1  CL 107 105 97  CO2 26 25 26   GLUCOSE 119* 114* 112*  BUN 17 11 10   CREATININE 0.57 0.55 0.57  CALCIUM 8.6 8.5 8.8  MG 1.8 1.8 1.6  PHOS 3.2  --   --   PROT 6.1 6.2 6.3  ALBUMIN 1.5* 1.5* 1.6*  AST 10 10 12   ALT 9 8 5   ALKPHOS 110 105 105  BILITOT 0.2* 0.2* 0.2*   Estimated Creatinine Clearance: 49.9 ml/min (by C-G formula based on Cr of 0.57).    Medications:  Scheduled:  . chlorhexidine  15 mL Mouth Rinse BID  . enoxaparin (LOVENOX) injection  1 mg/kg Subcutaneous Q12H  . [COMPLETED]  HYDROmorphone (DILAUDID) injection  1 mg  Intravenous Once  . insulin aspart  0-9 Units Subcutaneous Q6H  . [COMPLETED] ketorolac  30 mg Intravenous Once  . micafungin [facility]) IV  100 mg Intravenous Daily  . pantoprazole (PROTONIX) IV  40 mg Intravenous Q12H  . piperacillin-tazobactam (ZOSYN)  IV  3.375 g Intravenous Q8H  . sodium chloride  3 mL Intravenous Q12H  . [DISCONTINUED] pantoprazole (PROTONIX) IV  40 mg Intravenous Q24H   Infusions:  . [EXPIRED] .TPN (CLINIMIX-E) Adult 65 mL/hr at 08/07/12 1723  . Marland KitchenTPN (CLINIMIX-E) Adult 65 mL/hr at 08/08/12 1700  . 0.9 % NaCl with KCl 20 mEq / L    . fat emulsion 240 mL (08/08/12 1659)  . [DISCONTINUED] 0.9 % NaCl with KCl 20 mEq / L 75 mL/hr at 08/08/12 0548  . [DISCONTINUED] 0.9 % NaCl with KCl 20 mEq / L 75 mL/hr at 08/08/12 1900  . [DISCONTINUED] heparin 1,850 Units/hr (08/08/12 1610)    Insulin Requirements in the past 24 hours:  1 units, on sensitive SSI q 6 hours  Current Nutrition:  Clinimix E5/20 at 65 ml/hr with lipids MWF at 10 ml/hr (as per previous admission).  Note PTA Home TPN was being cycle over 18 hours: Dex 312gm, protein 78g, lipids 50mg  Tu/th, over 18h infusion  IVF: NS with 20 mEq KCl at 75 ml/hr  Nutritional Goals:  RD recs (4/15): Kcal: 3244-0102, Protein: 65-77 grams, Fluid: 1.8 L  Clinimix E5/20 @ 65 ml/hr (provides avg 1579 Kcal/day, 78 g protein/day).  Assessment: 88 y/oF with several recent hospitalizations for epigastric hernia repair and gastric biopsy, complicated by gastric perforation with abscess/periotinitis s/p omental patch repair. Pt was started on TPN 3/24 and d/c'd on home TPN 07/26/12.  Pt readmitted with BL PEs and CT suggestive of possible recurrent leak. Surgery consulted and was considering percutaneous drainage but plans now are for bowel rest and TNA.  TNA resumed 4/15 and adjusted to continuous 24 hour infusion while inpatient.  Surgery reports that patient may be able to try liquids soon.  Glucose: CBGs WNL  (90-133) Electrolytes:  Na dropped this AM to 129 - will be unable to adjust in premixed TNA, corr Ca 10.72 Renal:  SCr stable LFTs: AST/ALT WNL, albumin low TGs: 122 (4/15) Prealbumin: 6.9 on 4/15, 8.1 (3/31), <3 (3/25)  Plan:   Continue Clinimix E5/20 @ 65 ml/hr.  Fat emulsion MWF only due to ongoing shortage  MVI/TE MWF d/t national backorder  Continue CBGs q6h, with sensitive scale SSI.  TNA lab panels on Mondays & Thursdays.  CMP and Mg ordered daily by physician  Clance Boll, PharmD, BCPS Pager: [phone] 08/09/2012 7:25 AM

## 2012-08-10 LAB — TYPE AND SCREEN
ABO/RH(D): B POS
Unit division: 0

## 2012-08-10 LAB — ANAEROBIC CULTURE: Gram Stain: NONE SEEN

## 2012-08-10 LAB — COMPREHENSIVE METABOLIC PANEL
ALT: 7 U/L (ref 0–35)
AST: 15 U/L (ref 0–37)
Albumin: 1.7 g/dL — ABNORMAL LOW (ref 3.5–5.2)
CO2: 29 mEq/L (ref 19–32)
Chloride: 96 mEq/L (ref 96–112)
Creatinine, Ser: 0.62 mg/dL (ref 0.50–1.10)
Sodium: 131 mEq/L — ABNORMAL LOW (ref 135–145)
Total Bilirubin: 0.2 mg/dL — ABNORMAL LOW (ref 0.3–1.2)

## 2012-08-10 LAB — CBC
MCV: 80.2 fL (ref 78.0–100.0)
Platelets: 623 10*3/uL — ABNORMAL HIGH (ref 150–400)
RBC: 2.83 MIL/uL — ABNORMAL LOW (ref 3.87–5.11)
RDW: 16.1 % — ABNORMAL HIGH (ref 11.5–15.5)
WBC: 12.3 10*3/uL — ABNORMAL HIGH (ref 4.0–10.5)

## 2012-08-10 LAB — GLUCOSE, CAPILLARY
Glucose-Capillary: 116 mg/dL — ABNORMAL HIGH (ref 70–99)
Glucose-Capillary: 121 mg/dL — ABNORMAL HIGH (ref 70–99)
Glucose-Capillary: 113 mg/dL — ABNORMAL HIGH (ref 70–99)

## 2012-08-10 MED ORDER — CLINIMIX E/DEXTROSE (5/20) 5 % IV SOLN
INTRAVENOUS | Status: AC
  Administered 2012-08-10: 17:00:00 via INTRAVENOUS
  Filled 2012-08-10: qty 2000

## 2012-08-10 MED ORDER — INSULIN ASPART 100 UNIT/ML ~~LOC~~ SOLN: 0.0000 [IU] | Freq: Two times a day (BID) | SUBCUTANEOUS | Status: DC

## 2012-08-10 NOTE — Progress Notes (Signed)
Patient ID: Cynthia Bowen, female   DOB: 22-Mar-1946, 67 y.o.   MRN: [id number]    Subjective: Mild pain, generally feeling better and in better spirits  Objective: Vital signs in last 24 hours: Temp:  [98.2 F (36.8 C)-99.8 F (37.7 C)] 99.1 F (37.3 C) (04/20 9147) Pulse Rate:  [79-82] 82 (04/20 0608) Resp:  [18-20] 18 (04/20 0608) BP: (103-128)/(57-81) 103/60 mmHg (04/20 0608) SpO2:  [92 %-100 %] 98 % (04/20 0608) Weight:  [95 lb 7.4 oz (43.3 kg)] 95 lb 7.4 oz (43.3 kg) (04/20 0700) Last BM Date: 08/09/12  Intake/Output from previous day: 04/19 0701 - 04/20 0700 In: 1915.3 [I.V.:75; IV Piggyback:250; TPN:1560.3] Out: 2530 [Urine:2500; Drains:30] Intake/Output this shift:    General appearance: alert, cooperative and no distress Resp: no increased work of breathing GI: minimal epigastric tenderness. Scant JP drainage which is steadily decreasing.  Lab Results:   Recent Labs  08/09/12 0349 08/10/12 0500  WBC 13.3* 12.3*  HGB 7.2* 7.8*  HCT 21.1* 22.7*  PLT 582* 623*   BMET  Recent Labs  08/09/12 0349 08/10/12 0500  NA 129* 131*  K 4.1 4.0  CL 97 96  CO2 26 29  GLUCOSE 112* 106*  BUN 10 11  CREATININE 0.57 0.62  CALCIUM 8.8 9.3     Studies/Results: Dg Chest Port 1 View  08/09/2012  *RADIOLOGY REPORT*  Clinical Data: Pulmonary embolism.  Short of breath.  Cough.  PORTABLE CHEST - 1 VIEW  Comparison: 08/07/2012 radiograph.  08/05/2012 CT.  Findings: Right upper extremity PICC unchanged.  Cardiomegaly is partially obscured by airspace disease at the left base.  The left basilar airspace disease and probable effusion appears little changed compared to prior exam.  Right pleural effusion is present. Suspect underlying emphysema.  Partially visualized cervical ACDF. No pneumothorax.  IMPRESSION: No interval change.  Left basilar collapse / consolidation with probable left pleural effusion.   Original Report Authenticated By: Andreas Newport, M.D.      Anti-infectives: Anti-infectives   Start     Dose/Rate Route Frequency Ordered Stop   08/05/12 1200  micafungin (MYCAMINE) 100 mg in sodium chloride 0.9 % 100 mL IVPB     100 mg 100 mL/hr over 1 Hours Intravenous Daily 08/05/12 1113     08/05/12 0500  piperacillin-tazobactam (ZOSYN) IVPB 3.375 g     3.375 g 12.5 mL/hr over 240 Minutes Intravenous 3 times per day 08/05/12 0408        Assessment/Plan: Gastric fistula. Overall continually improving with decreasing signs of infection and steadily decreasing drainage. Abdomen is benign. Continue current treatment. Pulmonary emboli. On heparin drip and being followed by medicine.    LOS: 6 days    Saed Hudlow T 08/10/2012

## 2012-08-10 NOTE — Evaluation (Signed)
Physical Therapy Evaluation Patient Details Name: Cynthia Bowen MRN: [id number] DOB: 03/10/1946 Today's Date: 08/10/2012 Time:  -     PT Assessment / Plan / Recommendation Clinical Impression  Pt presents with bilateral PE with chest pain and abdominal pain.  PT familiar with pt as she was here in March for gastric perforation and then gastric leak, still with JP drain.  Tolerated OOB and ambulation in hallway very well with RW at min/guard to min assist.  Pt will benefit from skilled PT in acute venue to address deficits.  PT recommends LTACH (per MD notes) vs HHPT with 24/7 assist initially.      PT Assessment  Patient needs continued PT services    Follow Up Recommendations  Home health PT;LTACH    Does the patient have the potential to tolerate intense rehabilitation      Barriers to Discharge        Equipment Recommendations  Rolling walker with 5" wheels    Recommendations for Other Services     Frequency Min 3X/week    Precautions / Restrictions Precautions Precautions: Fall Restrictions Weight Bearing Restrictions: No   Pertinent Vitals/Pain 6/10 pain in abdomen      Mobility  Bed Mobility Bed Mobility: Not assessed Details for Bed Mobility Assistance: Pt sitting at EOB when PT arrived.  Transfers Transfers: Sit to Stand;Stand to [employer] Transfers Sit to Stand: 4: Min guard;With upper extremity assist;From bed Stand to Sit: 4: Min guard;With upper extremity assist;To chair/3-in-1;With armrests Stand Pivot Transfers: 4: Min guard Details for Transfer Assistance: Min/gaurd for safety with cues for hand placement.  Pt able to perform stand pivot transfer from bed to 3in1 without AD well.   Ambulation/Gait Ambulation/Gait Assistance: 4: Min guard Assistive device: Rolling walker Gait Pattern: Step-through pattern;Decreased stride length;Trunk flexed Gait velocity: decreased Stairs: No Wheelchair Mobility Wheelchair Mobility: No    Exercises      PT Diagnosis: Difficulty walking;Generalized weakness;Acute pain  PT Problem List: Decreased strength;Decreased activity tolerance;Decreased balance;Decreased mobility;Decreased knowledge of use of DME;Pain PT Treatment Interventions: DME instruction;Gait training;Stair training;Functional mobility training;Therapeutic activities;Therapeutic exercise;Balance training;Patient/family education   PT Goals Acute Rehab PT Goals PT Goal Formulation: With patient Time For Goal Achievement: 08/17/12 Potential to Achieve Goals: Good Pt will go Sit to Supine/Side: with modified independence PT Goal: Sit to Supine/Side - Progress: Goal set today Pt will go Sit to Stand: with modified independence PT Goal: Sit to Stand - Progress: Goal set today Pt will Ambulate: >150 feet;with modified independence;with least restrictive assistive device PT Goal: Ambulate - Progress: Goal set today Pt will Go Up / Down Stairs: 3-5 stairs;with supervision;with least restrictive assistive device PT Goal: Up/Down Stairs - Progress: Goal set today  Visit Information  Assistance Needed: +1    Subjective Data  Subjective: I really wish I could eat and go home.  Patient Stated Goal: to return home.    Prior Functioning  Home Living Lives With: Family (nephew) Type of Home: Mobile home Home Access: Stairs to enter Entrance Stairs-Number of Steps: 3 Entrance Stairs-Rails: Right;Left Home Layout: One level Bathroom Shower/Tub: Health visitor: Standard Home Adaptive Equipment: Straight cane Prior Function Level of Independence: Needs assistance Able to Take Stairs?: Yes Vocation: Retired Musician: No difficulties    Copywriter, advertising Arousal/Alertness: Awake/alert Behavior During Therapy: WFL for tasks assessed/performed Overall Cognitive Status: Within Functional Limits for tasks assessed    Extremity/Trunk Assessment Right Lower Extremity Assessment RLE  ROM/Strength/Tone: Deficits RLE ROM/Strength/Tone Deficits:  pt with notable diffuse muscle atrophy, however strength WFL per functional observation.  RLE Sensation: WFL - Light Touch Left Lower Extremity Assessment LLE ROM/Strength/Tone: Deficits LLE ROM/Strength/Tone Deficits: pt with notable diffuse muscle atrophy, however strength WFL per functional observation.  LLE Sensation: WFL - Light Touch   Balance    End of Session PT - End of Session Activity Tolerance: Patient tolerated treatment well Patient left: in chair;with call bell/phone within reach Nurse Communication: Mobility status  GP     Vista Deck 08/10/2012, 4:11 PM

## 2012-08-10 NOTE — Clinical Social Work Psychosocial (Signed)
     Clinical Social Work Department BRIEF PSYCHOSOCIAL ASSESSMENT 08/10/2012  Patient:  Cynthia Bowen, Cynthia Bowen     Account Number:  [id number]     Admit date:  08/04/2012  Clinical Social Worker:  Tiburcio Pea  Date/Time:  08/10/2012 10:45 AM  Referred by:  Physician  Date Referred:  08/09/2012 Referred for  Other - See comment   Other Referral:   LTAC VS SNF   Interview type:  Patient Other interview type:    PSYCHOSOCIAL DATA Living Status:  FAMILY Admitted from facility:   Level of care:   Primary support name:   Primary support relationship to patient:  FAMILY Degree of support available:   Nephew    Sister and other family members are also supportive.    CURRENT CONCERNS Current Concerns  Post-Acute Placement   Other Concerns:   On TPN- probably long term.  ?LTAC    SOCIAL WORK ASSESSMENT / PLAN CSW met with patient today- she was reluctant to talk to CSW because she stated that she is in a great deal of stomach pain.  Patient related that her nurse was aware and this pain has been chronic and long term.  With some encouragement- she agreed to talk to this CSW about possible d/c options. She has recently been in the hospital and decided to return home with the help of her nephew and she had [facility] to provide care and service of the TPN.  During that time- her nephew was hospitalized and she states that her sister and brother-in-law stepped in to try and help support her remaining at home.  She relates that the pain became to great and she had to return to the hospital.  Per patient- her experience with home health has been unsatisfactory; she relates that they delivered a shipment of TPN to her front porch and left it there and it spoiled.  (She had already been taken to the hospital). She related that she had told the nurse the day before that she was having extreme pain and would probalby have to return to the hospital. She feels very frustrated that they  still delivered the TPN and left it on her porch without checking to see if she was even home.  Patient admits that her attempts at home care have been unsatisfactory. CSW discussed the possibility of either a stay in LTAC or one of the few SNF's that will take TPN.  Patient agreed to consider possibility but would like to talk more with MD. Patient would not agree to SNF search at this time but CSW will initiate FL2 to send out after she speaks with MD. Note left on chart for MD to consider ?LTAC for patient as well.  CSW will monitor.   Assessment/plan status:  Psychosocial Support/Ongoing Assessment of Needs Other assessment/ plan:   Information/referral to community resources:   Discussed benefits of SNF vs LTAC  *SNF list not left with patient at this time.    PATIENTS/FAMILYS RESPONSE TO PLAN OF CARE: Patent is alert, oriented and was responsive to CSW. She did not feel good and complains of stomach pains.  Patient is willing to consider LTAC or SNF as she will remain on TPN at this time. She has not been satisfied with current Home Health arrangement with [facility].  CSW will notify RNCM for follow up of this situation. CSW to follow.

## 2012-08-10 NOTE — Progress Notes (Signed)
PATIENT DETAILS Name: Cynthia Bowen Age: 67 y.o. Sex: female Date of Birth: 10-May-1945 Admit Date: 08/04/2012 Admitting Physician Eduard Clos, MD [id number], [name], MD  Note-[facility] is primary team-Hospitalist providing consultation  Subjective: No major issues-small BM this am  Assessment/Plan: Principal Problem:   PE (pulmonary embolism) -provoked VTE-recent numerous Laparotomies, prolonged hospitalizations, dehydration etc -c.w therapeutic Lovenox -change to xarelto when oral intake resumed -plan for treatment atleast for 6 months, will need to check hypercoagulable panel-when off anticoagulation-can be done in the outpatient setting  Active Problems:   COPD (chronic obstructive pulmonary disease) -lungs clear -prn albuterol    Protein-calorie malnutrition, severe -on TNA  Recurrent Gastric Leak -requiring 3 trips to the OR last admission in March -CCS following and managing -drain in place -on empiric Zosyn and Micafungin -drain cultures-pending -blood cultures done on admission-neg  Anemia -2/2 to critical illness and now chronic disease -Hb holding steady  Hospitalist team will follow the patient throughout her Hospital stay. All PE and Medical issues will be addressed by our team.   Disposition: Remain inpatient  DVT Prophylaxis: Not needed as on therapeutic Lovenox   Code Status: Full code      PHYSICAL EXAM: Vital signs in last 24 hours: Filed Vitals:   08/09/12 2213 08/10/12 0130 08/10/12 0608 08/10/12 0700  BP: 113/66 111/67 103/60   Pulse: 82 79 82   Temp: 99.8 F (37.7 C) 98.9 F (37.2 C) 99.1 F (37.3 C)   TempSrc: Oral Oral Oral   Resp: 18 18 18    Height:      Weight:    43.3 kg (95 lb 7.4 oz)  SpO2: 98% 98% 98%     Weight change: -1.8 kg (-3 lb 15.5 oz) Filed Weights   08/08/12 0500 08/09/12 0000 08/10/12 0700  Weight: 45.8 kg (100 lb 15.5 oz) 45.1 kg (99 lb 6.8 oz) 43.3 kg (95 lb 7.4 oz)   Body mass index is  15.89 kg/(m^2).   Gen Exam: Awake and alert with clear speech.   Neck: Supple, No JVD.  Chest: B/L Clear.   CVS: S1 S2 Regular, no murmurs.  Abdomen: soft, BS +, non tender, non distended.  Extremities: no edema, lower extremities warm to touch. Neurologic: Non Focal.  Skin: No Rash.   Wounds: N/A.    Intake/Output from previous day:  Intake/Output Summary (Last 24 hours) at 08/10/12 1135 Last data filed at 08/10/12 0946  Gross per 24 hour  Intake 1405.33 ml  Output   2330 ml  Net -924.67 ml     LAB RESULTS: CBC  Recent Labs Lab 08/04/12 2320  08/06/12 0345 08/06/12 1205 08/07/12 0410 08/08/12 0600 08/09/12 0349 08/10/12 0500  WBC 28.8*  < > 19.1*  --  17.8* 16.6* 13.3* 12.3*  HGB 9.2*  < > 7.7* 7.6* 7.5* 7.5* 7.2* 7.8*  HCT 27.2*  < > 22.8* 22.1* 22.0* 21.5* 21.1* 22.7*  PLT 478*  < > 458*  --  437* 511* 582* 623*  MCV 81.0  < > 80.6  --  81.5 79.0 79.3 80.2  MCH 27.4  < > 27.2  --  27.8 27.6 27.1 27.6  MCHC 33.8  < > 33.8  --  34.1 34.9 34.1 34.4  RDW 15.5  < > 15.7*  --  15.9* 15.9* 16.0* 16.1*  LYMPHSABS 2.0  --  1.4  --   --   --   --   --   MONOABS 2.0*  --  1.5*  --   --   --   --   --  EOSABS 0.0  --  0.1  --   --   --   --   --   BASOSABS 0.0  --  0.0  --   --   --   --   --   < > = values in this interval not displayed.  Chemistries   Recent Labs Lab 08/06/12 0345 08/07/12 0410 08/08/12 0600 08/09/12 0349 08/10/12 0500  NA 137 139 137 129* 131*  K 3.7 3.1* 4.2 4.1 4.0  CL 104 107 105 97 96  CO2 27 26 25 26 29   GLUCOSE 126* 119* 114* 112* 106*  BUN 23 17 11 10 11   CREATININE 0.69 0.57 0.55 0.57 0.62  CALCIUM 9.1 8.6 8.5 8.8 9.3  MG 2.0 1.8 1.8 1.6 1.7    CBG:  Recent Labs Lab 08/09/12 0605 08/09/12 1214 08/09/12 1745 08/09/12 2352 08/10/12 0607  GLUCAP 133* 119* 108* 112* 116*    GFR Estimated Creatinine Clearance: 47.9 ml/min (by C-G formula based on Cr of 0.62).  Coagulation profile  Recent Labs Lab 08/05/12 0318   INR 1.28    Cardiac Enzymes  Recent Labs Lab 08/08/12 0740 08/08/12 1341 08/08/12 1900  TROPONINI <0.30 <0.30 <0.30    No components found with this basename: POCBNP,  No results found for this basename: DDIMER,  in the last 72 hours No results found for this basename: HGBA1C,  in the last 72 hours No results found for this basename: CHOL, HDL, LDLCALC, TRIG, CHOLHDL, LDLDIRECT,  in the last 72 hours No results found for this basename: TSH, T4TOTAL, FREET3, T3FREE, THYROIDAB,  in the last 72 hours No results found for this basename: VITAMINB12, FOLATE, FERRITIN, TIBC, IRON, RETICCTPCT,  in the last 72 hours No results found for this basename: LIPASE, AMYLASE,  in the last 72 hours  Urine Studies No results found for this basename: UACOL, UAPR, USPG, UPH, UTP, UGL, UKET, UBIL, UHGB, UNIT, UROB, ULEU, UEPI, UWBC, URBC, UBAC, CAST, CRYS, UCOM, BILUA,  in the last 72 hours  MICROBIOLOGY: Recent Results (from the past 240 hour(s))  URINE CULTURE     Status: None   Collection Time    08/04/12 11:03 PM      Result Value Range Status   Specimen Description URINE, CATHETERIZED   Final   Special Requests NONE   Final   Culture  Setup Time 08/05/2012 07:00   Final   Colony Count NO GROWTH   Final   Culture NO GROWTH   Final   Report Status 08/06/2012 FINAL   Final  CULTURE, BLOOD (ROUTINE X 2)     Status: None   Collection Time    08/05/12  4:25 AM      Result Value Range Status   Specimen Description BLOOD LEFT ARM   Final   Special Requests BOTTLES DRAWN AEROBIC ONLY 1 CC   Final   Culture  Setup Time 08/05/2012 08:32   Final   Culture     Final   Value:        BLOOD CULTURE RECEIVED NO GROWTH TO DATE CULTURE WILL BE HELD FOR 5 DAYS BEFORE ISSUING A FINAL NEGATIVE REPORT   Report Status PENDING   Incomplete  CULTURE, BLOOD (ROUTINE X 2)     Status: None   Collection Time    08/05/12  4:30 AM      Result Value Range Status   Specimen Description BLOOD LEFT WRIST   Final    Special Requests BOTTLES DRAWN AEROBIC  ONLY 2CC   Final   Culture  Setup Time 08/05/2012 08:32   Final   Culture     Final   Value:        BLOOD CULTURE RECEIVED NO GROWTH TO DATE CULTURE WILL BE HELD FOR 5 DAYS BEFORE ISSUING A FINAL NEGATIVE REPORT   Report Status PENDING   Incomplete  GRAM STAIN     Status: None   Collection Time    08/05/12  8:50 AM      Result Value Range Status   Specimen Description ABDOMEN JP FLUID   Final   Special Requests Normal   Final   Gram Stain     Final   Value: CYTOSPIN     WBC PRESENT,BOTH PMN AND MONONUCLEAR     YEAST     GRAM NEGATIVE RODS     GRAM POSITIVE COCCI IN CHAINS IN PAIRS   Report Status 08/05/2012 FINAL   Final  WOUND CULTURE     Status: None   Collection Time    08/05/12  8:50 AM      Result Value Range Status   Specimen Description DRAINAGE JP   Final   Special Requests Normal   Final   Gram Stain     Final   Value: WBC PRESENT,BOTH PMN AND MONONUCLEAR     YEAST     GRAM NEGATIVE RODS     GRAM POSITIVE COCCI     IN PAIRS IN CHAINS Gram Stain Report Called to,Read Back By and Verified With: Gram Stain Report Called to,Read Back By and Verified With: J CONRAD RN 1000 08/05/12 BY SHUEA Performed by [facility] CYTOSPIN SLIDE   Culture Culture reincubated for better growth   Final   Report Status PENDING   Incomplete  ANAEROBIC CULTURE     Status: None   Collection Time    08/05/12  8:50 AM      Result Value Range Status   Specimen Description DRAINAGE JP   Final   Special Requests Normal   Final   Gram Stain     Final   Value: NO WBC SEEN     NO SQUAMOUS EPITHELIAL CELLS SEEN     NO ORGANISMS SEEN   Culture     Final   Value: NO ANAEROBES ISOLATED; CULTURE IN PROGRESS FOR 5 DAYS   Report Status PENDING   Incomplete    RADIOLOGY STUDIES/RESULTS: Dg Chest 2 View  07/13/2012  *RADIOLOGY REPORT*  Clinical Data:    Cough, abdominal pain.  CHEST - 2 VIEW  Comparison: 07/07/2012  Findings: Small left pleural effusion  appears slightly increased. Some worsening of adjacent consolidation / atelectasis at the left lung base.  Right lungs clear.  Heart size normal.  Cervical fixation hardware noted.  IMPRESSION:  1.  Some interval increase in left lower lung consolidation / atelectasis with adjacent effusion.   Original Report Authenticated By: D. Andria Rhein, MD    Ct Head Wo Contrast  08/06/2012  *RADIOLOGY REPORT*  Clinical Data: Altered mental status.  CT HEAD WITHOUT CONTRAST  Technique:  Contiguous axial images were obtained from the base of the skull through the vertex without contrast.  Comparison: None.  Findings: There is no evidence of acute intracranial abnormality including infarction, hemorrhage, midline shift, abnormal extra- axial fluid collection, mass, mass effect, midline shift or hydrocephalus is identified.  There is no pneumocephalus. The calvarium is intact.  Imaged paranasal sinuses and mastoid air cells are clear.  IMPRESSION:  No acute finding.   Original Report Authenticated By: Holley Dexter, M.D.    Ct Angio Chest Pe W/cm &/or Wo Cm  08/05/2012  *RADIOLOGY REPORT*  Clinical Data: Shortness of breath.  Sharp left chest pain.  CT ANGIOGRAPHY CHEST  Technique:  Multidetector CT imaging of the chest using the standard protocol during bolus administration of intravenous contrast. Multiplanar reconstructed images including MIPs were obtained and reviewed to evaluate the vascular anatomy.  Contrast: 80mL OMNIPAQUE IOHEXOL 350 MG/ML SOLN  Comparison: CT scan of the chest dated 04/10/2012 and CT scan of the abdomen dated 07/13/2012  Findings: There is an extensive large pulmonary embolus in the pulmonary artery to the left lower lobe.  There is secondary extensive left lower lobe consolidation and interstitial accentuation which may represent pulmonary infarction. Tiny emboli in the proximal portion of the pulmonary artery to the left upper lobe.  This is superimposed on diffuse emphysema.  Heart size is  normal. No hilar mediastinal adenopathy.  The right lung is clear except for some slight atelectasis at the right base.  There is a small chronic pericardial effusion.  No acute osseous abnormality.  The thoracic aorta is normal except for slight calcification. Origins of the brachiocephalic vessels are widely patent.  IMPRESSION:  1.  Large extensive pulmonary embolus in the left lower lobe pulmonary artery.  Smaller emboli in the left upper lobe pulmonary artery. 2.  Possible infarction in the left lower lobe. 3.  Diffuse emphysema.   Original Report Authenticated By: Francene Boyers, M.D.    Ct Abdomen Pelvis W Contrast  08/05/2012  *RADIOLOGY REPORT*  Clinical Data: Abdominal pain.  Recent gastric perforation.  CT ABDOMEN AND PELVIS WITH CONTRAST  Technique:  Multidetector CT imaging of the abdomen and pelvis was performed following the standard protocol during bolus administration of intravenous contrast.  Contrast: 80mL OMNIPAQUE IOHEXOL 350 MG/ML SOLN  Comparison: CT scan dated 07/13/2012  Findings: Drain is present in the upper abdomen anterior to the antrum of the stomach.  There are a few small extraluminal air fluid collections at that site, slightly diminished. There is an area of what is probably residual contrast and a there are fluid collection posterior to the left lobe of the liver seen on image number 26 of series 7.  Thickening of the mucosa of the distal stomach is unchanged. There are a few extraluminal air bubbles anterior to the distal antrum.  I cannot exclude a recurrent perforation based on these images.  Liver, spleen, pancreas, adrenal glands, and kidneys appear stable. Bilateral renal cysts and right renal calculi are unchanged.  No dilated large or small bowel.  Stable 2.5 cm cyst on the right ovary.  IMPRESSION: Interval slight decrease in the fluid and air collections anterior to the stomach adjacent to the soft tissue drain. Persistent or recurrent contrast in the fluid collections  anterior to the stomach.  This could represent a recurrent leak.  No other change.   Original Report Authenticated By: Francene Boyers, M.D.    Ct Abdomen Pelvis W Contrast  07/13/2012  *RADIOLOGY REPORT*  Clinical Data: Postop abdominal pain, question abscess.  Elevated white blood cell count.  CT ABDOMEN AND PELVIS WITH CONTRAST  Technique:  Multidetector CT imaging of the abdomen and pelvis was performed following the standard protocol during bolus administration of intravenous contrast.  Contrast: OMNIPAQUE IOHEXOL 300 MG/ML  SOLN  Comparison: Upper GI 07/10/2012, CT 07/07/2012  Findings: Superior to the gastric body, adjacent to the surgical drain, there  is an air fluid collection which is increased in volume compared to prior.  This measures 4.5 x 3.1 cm compared to 2.4 x 1.3 cm on prior.   This is at the site of the prior perforation.  This gas and fluid collection does contains oral contrast.  Difficult to say if this is from the current exam or prior exam.  There is communication with the more superior air fluid collection beneath the left hepatic margin which measures 4.2 x 2.4 cm which is new from prior.  The stomach itself is grossly normal.  There is thickening through the gastric antrum and  the first portion the duodenum.  There is a potential track from the  stomach to the fluid collection seen on sagittal image 74 (series 6).  Oral contrast flows into the small bowel without evidence obstruction.  The colon has  a moderate volume stool.  The rectum is normal.  There is a new left pleural effusion with associated passive atelectasis.  No pericardial fluid.  There is no focal hepatic lesion. There is small amount gas along the falciform ligament related to the gas collection as described above.  The pancreas, spleen, adrenal glands, and kidneys are unchanged.  There are callus calculi within the right kidney.  No significant free fluid the pelvis.  There is a cyst associated with the right  ovary unchanged.  Bladder and uterus are normal. Uterus is absent  IMPRESSION:  1.  Interval increase in volume of fluid collection at the site of prior perforation.  There is an air-fluid level with oral contrast at this level.  Suspicion for continued leak or re perforation. Surgical drain adjacent to the collection.  2.  Extension from the dominant fluid collection into a new collection more superior along the posterior aspect the left hepatic lobe.  These gas collections are seen on the CT scout.  3.  New   left pleural effusion and atelectasis.  Findings conveyed to on-call surgeon Dr.  Johna Sheriff through the OR nurse on 07/13/2012 at 11 :15 am   Original Report Authenticated By: Genevive Bi, M.D.    Dg Chest Port 1 View  08/09/2012  *RADIOLOGY REPORT*  Clinical Data: Pulmonary embolism.  Short of breath.  Cough.  PORTABLE CHEST - 1 VIEW  Comparison: 08/07/2012 radiograph.  08/05/2012 CT.  Findings: Right upper extremity PICC unchanged.  Cardiomegaly is partially obscured by airspace disease at the left base.  The left basilar airspace disease and probable effusion appears little changed compared to prior exam.  Right pleural effusion is present. Suspect underlying emphysema.  Partially visualized cervical ACDF. No pneumothorax.  IMPRESSION: No interval change.  Left basilar collapse / consolidation with probable left pleural effusion.   Original Report Authenticated By: Andreas Newport, M.D.    Dg Chest Port 1 View  08/07/2012  *RADIOLOGY REPORT*  Clinical Data: Follow-up left lower lobe infiltrate.  Pulmonary embolism.  PORTABLE CHEST - 1 VIEW  Comparison: CT 08/05/2012.  Radiographs 08/04/2012.  Findings: 0512 hours.  The right arm PICC is unchanged at the SVC right atrial level.  There is stable cardiomegaly.  There has been some worsening of the left lower lobe air space disease.  There are probable small bilateral pleural effusions.  No pneumothorax is seen.  IMPRESSION: Slight worsening of left  lower lobe air space disease.  This could reflect pneumonia or pulmonary infarction given the prior CT findings.   Original Report Authenticated By: Carey Bullocks, M.D.    Dg Chest [facility]  1 View  08/04/2012  *RADIOLOGY REPORT*  Clinical Data: Chest pain and cough.  PORTABLE CHEST - 1 VIEW  Comparison: Chest CT 07/13/2012.  Findings: The cardiac silhouette, mediastinal and hilar contours are stable.  Persistent left lower lobe process appears to be a combination of a pleural effusion and atelectasis.  Minimal right basilar atelectasis also.  Slight increased interstitial markings could be due to bronchitis or interstitial pneumonitis.  The right PICC line tip is in the distal SVC.  The bony thorax is intact.  IMPRESSION: Persistent left lower lobe process. Possible bronchitis or interstitial pneumonitis.   Original Report Authenticated By: [name], M.D.    Dg Kayleen Memos W/water Sol Cm  07/22/2012  *RADIOLOGY REPORT*  Clinical Data:  Status post gastric perforation with recurrent leak.  Increased volume of drainage from surgical drains.  UPPER GI SERIES WITH KUB  Technique:  Focused upper GI was performed with one 120 ml of Omnipaque contrast. Fluoroscopy Time: 6.2 minutes  Comparison:  07/15/2012 and CT of 07/13/2012  Findings: Pre procedure scout film demonstrates bilateral surgical drains.  Nonobstructive bowel gas pattern, without gross free intraperitoneal air.  Postcontrast images demonstrate normal caliber of the stomach. There is irregularity about the gastric antrum.  No gross contrast extravasation is seen.  No well-defined perigastric contrast collection. Suspicion of extraluminal contrast adjacent the gastric antrum and pylorus on series 77.  Most apparent on series 35, is contrast entering the right-sided surgical drain.  This results in increased density within the drain on the final postprocedure radiograph.  IMPRESSION: Subtle findings which are consistent with contrast leak, presumably from the  gastric antrum or pylorus, with small volume contrast draining into the right-sided surgical drain (only readily apparent on image/series 81).  These results will be called to the ordering clinician or representative by the Radiologist Assistant, and communication documented in the PACS Dashboard.   Original Report Authenticated By: Jeronimo Greaves, M.D.    Dg Kayleen Memos W/water Sol Cm  07/15/2012  *RADIOLOGY REPORT*  Clinical Data:  History of recurrent gastric leakage post repair.  UPPER GI SERIES WITH KUB  Technique:100 ml of Omnipaque-300 was injected through the enteric tube filling the stomach with water soluble contrast.  The patient was evaluated in the supine, left lateral, right lateral, and oblique positions.  Fluoroscopy Time: 2.23 minutes  Comparison:  CT 07/14/2002  Findings: : Initial abdominal image shows residual contrast within the colon from prior CT.  Surgical drain is in place entering from the right.  After water soluble contrast was injected into the stomach the patient was examined in multiple positions.  Contrast flowed from the stomach into the duodenum.  Gastroesophageal reflux was seen with contrast in the esophagus.  However, there is no leakage of contrast from the gastrointestinal tract.  No perforation was demonstrated.  IMPRESSION: No gastrointestinal perforation was demonstrated.   Original Report Authenticated By: Onalee Hua Call     MEDICATIONS: Scheduled Meds: . chlorhexidine  15 mL Mouth Rinse BID  . enoxaparin (LOVENOX) injection  1 mg/kg Subcutaneous Q12H  . insulin aspart  0-9 Units Subcutaneous Q12H  . micafungin (MYCAMINE) IV  100 mg Intravenous Daily  . pantoprazole (PROTONIX) IV  40 mg Intravenous Q12H  . piperacillin-tazobactam (ZOSYN)  IV  3.375 g Intravenous Q8H  . sodium chloride  3 mL Intravenous Q12H   Continuous Infusions: . Marland KitchenTPN (CLINIMIX-E) Adult 65 mL/hr at 08/09/12 1806  . Marland KitchenTPN (CLINIMIX-E) Adult     PRN Meds:.acetaminophen, acetaminophen, albuterol,  diphenhydrAMINE, morphine injection,  ondansetron (ZOFRAN) IV, ondansetron, promethazine, sodium chloride  Antibiotics: Anti-infectives   Start     Dose/Rate Route Frequency Ordered Stop   08/05/12 1200  micafungin (MYCAMINE) 100 mg in sodium chloride 0.9 % 100 mL IVPB     100 mg 100 mL/hr over 1 Hours Intravenous Daily 08/05/12 1113     08/05/12 0500  piperacillin-tazobactam (ZOSYN) IVPB 3.375 g     3.375 g 12.5 mL/hr over 240 Minutes Intravenous 3 times per day 08/05/12 Natividad Brood, MD  [facility] Pager:[phone]  If 7PM-7AM, please contact night-coverage [email or web address] Password [facility] 08/10/2012, 11:35 AM   LOS: 6 days

## 2012-08-10 NOTE — Progress Notes (Addendum)
PARENTERAL NUTRITION CONSULT NOTE - Follow-up  Pharmacy Consult for TNA Indication: Recurrent gastric perforation, prolonged NPO status  Allergies  Allergen Reactions  . Avelox (Moxifloxacin Hcl In Nacl) Nausea And Vomiting  . Alendronate Sodium Nausea And Vomiting  . Aspirin Nausea Only  . Codeine Nausea And Vomiting  . Doxycycline   . Fluconazole   . Hydrocodone Nausea And Vomiting    GI distress  . Neurontin (Gabapentin) Other (See Comments)    Mood changes   . Nsaids Other (See Comments)    Severe gastritis & perforation - avoid NSAIDs when possible  . Sertraline Hcl Other (See Comments)    Hallucinations   . Sulfa Antibiotics Rash    Patient Measurements: Height: 5\' 5"  (165.1 cm) Weight: 95 lb 7.4 oz (43.3 kg) IBW/kg (Calculated) : 57 Usual Weight: ~44kg  Vital Signs: Temp: 99.1 F (37.3 C) (04/20 2130) Temp src: Oral (04/20 0608) BP: 103/60 mmHg (04/20 8657) Pulse Rate: 82 (04/20 0608) Intake/Output from previous day: 04/19 0701 - 04/20 0700 In: 1915.3 [I.V.:75; IV Piggyback:250; TPN:1560.3] Out: 2530 [Urine:2500; Drains:30] Intake/Output from this shift: Total I/O In: 0  Out: 600 [Urine:600]  Labs:  Recent Labs  08/08/12 0600 08/09/12 0349 08/10/12 0500  WBC 16.6* 13.3* 12.3*  HGB 7.5* 7.2* 7.8*  HCT 21.5* 21.1* 22.7*  PLT 511* 582* 623*     Recent Labs  08/08/12 0600 08/09/12 0349 08/10/12 0500  NA 137 129* 131*  K 4.2 4.1 4.0  CL 105 97 96  CO2 25 26 29   GLUCOSE 114* 112* 106*  BUN 11 10 11   CREATININE 0.55 0.57 0.62  CALCIUM 8.5 8.8 9.3  MG 1.8 1.6 1.7  PROT 6.2 6.3 6.8  ALBUMIN 1.5* 1.6* 1.7*  AST 10 12 15   ALT 8 5 7   ALKPHOS 105 105 112  BILITOT 0.2* 0.2* 0.2*   Estimated Creatinine Clearance: 47.9 ml/min (by C-G formula based on Cr of 0.62).    Medications:  Scheduled:  . chlorhexidine  15 mL Mouth Rinse BID  . enoxaparin (LOVENOX) injection  1 mg/kg Subcutaneous Q12H  . insulin aspart  0-9 Units Subcutaneous Q6H  .  micafungin (MYCAMINE) IV  100 mg Intravenous Daily  . pantoprazole (PROTONIX) IV  40 mg Intravenous Q12H  . piperacillin-tazobactam (ZOSYN)  IV  3.375 g Intravenous Q8H  . sodium chloride  3 mL Intravenous Q12H   Infusions:  . [EXPIRED] .TPN (CLINIMIX-E) Adult 65 mL/hr at 08/08/12 1700  . Marland KitchenTPN (CLINIMIX-E) Adult 65 mL/hr at 08/09/12 1806  . [EXPIRED] fat emulsion 240 mL (08/08/12 1659)    Insulin Requirements in the past 24 hours:  0 units, on sensitive SSI q 6 hours  Current Nutrition:  Clinimix E5/20 at 65 ml/hr with lipids MWF at 10 ml/hr (as per previous admission).  Note PTA Home TPN was being cycle over 18 hours: Dex 312gm, protein 78g, lipids 50mg  Tu/th, over 18h infusion  IVF: NS flushes  Nutritional Goals:  RD recs (4/15): Kcal: 8469-6295, Protein: 65-77 grams, Fluid: 1.8 L  Clinimix E5/20 @ 65 ml/hr (provides avg 1579 Kcal/day, 78 g protein/day).  Assessment: 50 y/oF with several recent hospitalizations for epigastric hernia repair and gastric biopsy, complicated by gastric perforation with abscess/periotinitis s/p omental patch repair. Pt was started on TPN 3/24 and d/c'd on home TPN 07/26/12.  Pt readmitted with BL PEs and CT suggestive of possible recurrent leak. Surgery consulted and was considering percutaneous drainage but plans now are for bowel rest and TNA.  TNA resumed 4/15 and adjusted to continuous 24 hour infusion while inpatient.    Glucose: CBGs WNL, have been controlled so will reduce CBG checks to q12h Electrolytes:  Na 131 - will be unable to adjust in premixed TNA, corr Ca 11.14  Renal:  SCr stable LFTs: AST/ALT WNL, albumin low TGs: 122 (4/15) Prealbumin: 6.9 on 4/15, 8.1 (3/31), <3 (3/25)  4/20: Decreasing drainage. Surgery reports that patient may be able to try liquids soon.    Plan:   Continue Clinimix E5/20 @ 65 ml/hr.  Fat emulsion MWF only due to ongoing shortage  MVI/TE MWF d/t national backorder  Reduce CBGs to twice daily with  sensitive scale SSI.  TNA lab panels on Mondays & Thursdays.  CMP and Mg ordered daily by physician.  Clance Boll, PharmD, BCPS Pager: [phone] 08/10/2012 11:04 AM

## 2012-08-11 ENCOUNTER — Encounter (INDEPENDENT_AMBULATORY_CARE_PROVIDER_SITE_OTHER): Payer: Medicare Other | Admitting: Surgery

## 2012-08-11 LAB — DIFFERENTIAL
Basophils Relative: 0 % (ref 0–1)
Eosinophils Relative: 2 % (ref 0–5)
Lymphs Abs: 1.7 10*3/uL (ref 0.7–4.0)
Monocytes Absolute: 1.3 10*3/uL — ABNORMAL HIGH (ref 0.1–1.0)
Monocytes Relative: 12 % (ref 3–12)
Neutro Abs: 7.5 10*3/uL (ref 1.7–7.7)

## 2012-08-11 LAB — CBC
HCT: 22.3 % — ABNORMAL LOW (ref 36.0–46.0)
MCH: 27.3 pg (ref 26.0–34.0)
MCV: 80.2 fL (ref 78.0–100.0)
Platelets: 631 10*3/uL — ABNORMAL HIGH (ref 150–400)
RBC: 2.78 MIL/uL — ABNORMAL LOW (ref 3.87–5.11)
WBC: 10.7 10*3/uL — ABNORMAL HIGH (ref 4.0–10.5)

## 2012-08-11 LAB — COMPREHENSIVE METABOLIC PANEL
ALT: 16 U/L (ref 0–35)
AST: 24 U/L (ref 0–37)
Alkaline Phosphatase: 117 U/L (ref 39–117)
CO2: 28 mEq/L (ref 19–32)
Calcium: 9.5 mg/dL (ref 8.4–10.5)
GFR calc Af Amer: >90 mL/min (ref >90)
Glucose, Bld: 116 mg/dL — ABNORMAL HIGH (ref 70–99)
Potassium: 4 mEq/L (ref 3.5–5.1)
Sodium: 133 mEq/L — ABNORMAL LOW (ref 135–145)
Total Protein: 7.1 g/dL (ref 6.0–8.3)

## 2012-08-11 LAB — CULTURE, BLOOD (ROUTINE X 2): Culture: NO GROWTH

## 2012-08-11 LAB — PREALBUMIN: Prealbumin: 9.2 mg/dL — ABNORMAL LOW (ref 17.0–34.0)

## 2012-08-11 LAB — GLUCOSE, CAPILLARY: Glucose-Capillary: 119 mg/dL — ABNORMAL HIGH (ref 70–99)

## 2012-08-11 LAB — TRIGLYCERIDES: Triglycerides: 97 mg/dL (ref <150)

## 2012-08-11 MED ORDER — TRACE MINERALS CR-CU-F-FE-I-MN-MO-SE-ZN IV SOLN
INTRAVENOUS | Status: AC
  Administered 2012-08-11: 17:00:00 via INTRAVENOUS
  Filled 2012-08-11: qty 2000

## 2012-08-11 MED ORDER — FAT EMULSION 20 % IV EMUL
250.0000 mL | INTRAVENOUS | Status: AC
  Administered 2012-08-11: 250 mL via INTRAVENOUS
  Filled 2012-08-11: qty 250

## 2012-08-11 NOTE — Progress Notes (Signed)
PT Cancellation Note  Patient Details Name: Cynthia Bowen MRN: [id number] DOB: July 07, 1945   Cancelled Treatment:     Attempted PT tx session. Pt declined to participate-experiencing headache at this time. Will check back another time. Thanks.    Rebeca Alert, MPT Pager: [phone]

## 2012-08-11 NOTE — Progress Notes (Signed)
Patient ID: Cynthia Bowen, female   DOB: [birth year], 67 y.o.   MRN: [id number]   The patient had nausea and some epigastric pain after taking some clear liquids. She has more pink colored drainage from her tube. There is about 25 cc in the bulb right now. 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  Her midline incision is well healed. Minimal abdominal tenderness.   pre-albumin is up to 9.2. This is still low but is a upward trend.  We will continue the TNA and drained for now. She may simple liquids but we will not advance her diet. She will likely need to continue with subcutaneous Lovenox rather than switching to oral anticoagulant.  [name]. [name], MD, [facility]  08/11/2012 1:36 PM

## 2012-08-11 NOTE — Progress Notes (Signed)
PARENTERAL NUTRITION CONSULT NOTE - Follow-up  Pharmacy Consult for TNA Indication: Recurrent gastric perforation, prolonged NPO status  Allergies  Allergen Reactions  . Avelox (Moxifloxacin Hcl In Nacl) Nausea And Vomiting  . Alendronate Sodium Nausea And Vomiting  . Aspirin Nausea Only  . Codeine Nausea And Vomiting  . Doxycycline   . Fluconazole   . Hydrocodone Nausea And Vomiting    GI distress  . Neurontin (Gabapentin) Other (See Comments)    Mood changes   . Nsaids Other (See Comments)    Severe gastritis & perforation - avoid NSAIDs when possible  . Sertraline Hcl Other (See Comments)    Hallucinations   . Sulfa Antibiotics Rash    Patient Measurements: Height: 5\' 5"  (165.1 cm) Weight: 88 lb 13.5 oz (40.3 kg) IBW/kg (Calculated) : 57 Usual Weight: ~44kg  Vital Signs: Temp: 98.1 F (36.7 C) (04/21 0624) Temp src: Oral (04/21 0624) BP: 130/87 mmHg (04/21 0624) Pulse Rate: 81 (04/21 0624) Intake/Output from previous day: 04/20 0701 - 04/21 0700 In: 1528.6 [TPN:1528.6] Out: 2360 [Urine:2350; Drains:10] Intake/Output from this shift: Total I/O In: -  Out: 1 [Urine:1]  Labs:  Recent Labs  08/09/12 0349 08/10/12 0500 08/11/12 0430  WBC 13.3* 12.3* 10.7*  HGB 7.2* 7.8* 7.6*  HCT 21.1* 22.7* 22.3*  PLT 582* 623* 631*     Recent Labs  08/09/12 0349 08/10/12 0500 08/11/12 0430  NA 129* 131* 133*  K 4.1 4.0 4.0  CL 97 96 98  CO2 26 29 28   GLUCOSE 112* 106* 116*  BUN 10 11 13   CREATININE 0.57 0.62 0.66  CALCIUM 8.8 9.3 9.5  MG 1.6 1.7 1.8  PHOS  --   --  3.6  PROT 6.3 6.8 7.1  ALBUMIN 1.6* 1.7* 1.9*  AST 12 15 24   ALT 5 7 16   ALKPHOS 105 112 117  BILITOT 0.2* 0.2* 0.2*  TRIG  --   --  97  CHOL  --   --  81   Estimated Creatinine Clearance: 44.6 ml/min (by C-G formula based on Cr of 0.66).    Medications:  Scheduled:  . chlorhexidine  15 mL Mouth Rinse BID  . enoxaparin (LOVENOX) injection  1 mg/kg Subcutaneous Q12H  . insulin aspart   0-9 Units Subcutaneous Q12H  . micafungin (MYCAMINE) IV  100 mg Intravenous Daily  . pantoprazole (PROTONIX) IV  40 mg Intravenous Q12H  . piperacillin-tazobactam (ZOSYN)  IV  3.375 g Intravenous Q8H  . sodium chloride  3 mL Intravenous Q12H  . [DISCONTINUED] insulin aspart  0-9 Units Subcutaneous Q6H   Infusions:  . [EXPIRED] .TPN (CLINIMIX-E) Adult 65 mL/hr at 08/09/12 1806  . Marland KitchenTPN (CLINIMIX-E) Adult 65 mL/hr at 08/10/12 1722    Insulin Requirements in the past 24 hours:  0 units, on sensitive SSI q 12 hours  Current Nutrition:  Clinimix E5/20 at 65 ml/hr with lipids MWF at 10 ml/hr (as per previous admission).  Note PTA Home TPN was being cycle over 18 hours: Dex 312gm, protein 78g, lipids 50mg  Tu/th, over 18h infusion  IVF: NS flushes  Nutritional Goals:  RD recs (4/15): Kcal: 1610-9604, Protein: 65-77 grams, Fluid: 1.8 L  Clinimix E5/20 @ 65 ml/hr (provides avg 1579 Kcal/day, 78 g protein/day).  Assessment: 34 y/oF with several recent hospitalizations for epigastric hernia repair and gastric biopsy, complicated by gastric perforation with abscess/periotinitis s/p omental patch repair. Pt was started on TPN 3/24 and d/c'd on home TPN 07/26/12.  Pt readmitted with BL  PEs and CT suggestive of possible recurrent leak. Surgery consulted and was considering percutaneous drainage but plans now are for bowel rest and TNA.  TNA resumed 4/15 and adjusted to continuous 24 hour infusion while inpatient.    Glucose: CBGs stable at goal, have been controlled so will reduce CBG checks to q12h Electrolytes:  Na slightly low but stable - will be unable to adjust in premixed TNA, corr Ca 11.18 Renal:  SCr stable LFTs: AST/ALT WNL, albumin low TGs:  WNL 4/15 and 4/21 Prealbumin: 6.9 on 4/15, 8.1 (3/31), <3 (3/25)  4/21: Decreasing drainage. CL diet to start today  Plan:   Continue Clinimix E5/20 @ goal rate of 65 ml/hr.  Fat emulsion MWF only due to ongoing shortage  MVI/TE MWF  d/t national backorder  TNA lab panels on Mondays & Thursdays.  CMP and Mg ordered daily by physician.  Monitor for increasing toleration of diet and wean of TNA   Hessie Knows, PharmD, BCPS Pager [phone] 08/11/2012 8:32 AM

## 2012-08-11 NOTE — Progress Notes (Signed)
Pt sitting up in chair. Tolerating clear liquids. C/O headache that was relieved by Tylenol 650 mg PO. Ambulating to BR with assist. JP drain draining pink liquid after eating red jello, Dr [name] in to see pt.

## 2012-08-11 NOTE — Progress Notes (Signed)
OT Cancellation Note  Patient Details Name: RINI MOFFIT MRN: [id number] DOB: 06/28/1945   Cancelled Treatment:    Reason Eval/Treat Not Completed: Medical issues which prohibited therapy;Other (comment) (pt sick to her stomach and just received meds for this)  Alba Cory 08/11/2012, 12:23 PM

## 2012-08-11 NOTE — Progress Notes (Signed)
ANTICOAGULATION CONSULT NOTE - Follow Up  Pharmacy Consult for Lovenox Indication: pulmonary embolus  Allergies  Allergen Reactions  . Avelox (Moxifloxacin Hcl In Nacl) Nausea And Vomiting  . Alendronate Sodium Nausea And Vomiting  . Aspirin Nausea Only  . Codeine Nausea And Vomiting  . Doxycycline   . Fluconazole   . Hydrocodone Nausea And Vomiting    GI distress  . Neurontin (Gabapentin) Other (See Comments)    Mood changes   . Nsaids Other (See Comments)    Severe gastritis & perforation - avoid NSAIDs when possible  . Sertraline Hcl Other (See Comments)    Hallucinations   . Sulfa Antibiotics Rash    Patient Measurements: Height: 5\' 5"  (165.1 cm) Weight: 88 lb 13.5 oz (40.3 kg) IBW/kg (Calculated) : 57  Vital Signs: Temp: 98.1 F (36.7 C) (04/21 0624) Temp src: Oral (04/21 0624) BP: 130/87 mmHg (04/21 0624) Pulse Rate: 81 (04/21 0624)  Labs:  Recent Labs  08/08/12 1341 08/08/12 1900  08/09/12 0349 08/10/12 0500 08/11/12 0430  HGB  --   --   < > 7.2* 7.8* 7.6*  HCT  --   --   --  21.1* 22.7* 22.3*  PLT  --   --   --  582* 623* 631*  CREATININE  --   --   --  0.57 0.62 0.66  TROPONINI <0.30 <0.30  --   --   --   --   < > = values in this interval not displayed.  Estimated Creatinine Clearance: 44.6 ml/min (by C-G formula based on Cr of 0.66).   Medical History: Past Medical History  Diagnosis Date  . COPD (chronic obstructive pulmonary disease)   . Pneumonia 12-2011  . GERD (gastroesophageal reflux disease)   . Headache   . Arthritis     osteoarthritis  . Allergy   . Depression   . Neuromuscular disorder   . Osteoporosis   . Bronchitis     CURRENTLY AS OF 06/30/12 - HAS COUGH AND FINISHED ANTIBIOTIC FOR BRONCHITIS  . Fibromyalgia   . Pain     ABDOMINAL PAIN AND NAUSEA  . Pain     SOMETIMES PAIN RIGHT EAR AND NECK--STATES CAUSED BY A "LUMP" ON BACK OF EAR--USES KENALOG CREAM TOPICALLY AS NEEDED.     Assessment: 14 y/oF with several recent  hospitalizations for multiple abdominal surgeries readmitted with new bowel leak and bilateral PE.  IV heparin started 4/15.  Now that there is no plan for surgery for gastric leak, MD asked pharmacy to switch heparin drip to Lovenox on 4/18 with potential of switching to Xarelto once patient tolerating an oral diet.  Currently still on TNA but CL diet to start today  Renal function stable, CrCl~ 45 ml/min  Low body weight, weight 45 kg  Hgb low but stable and plts elevated but stable, no bleeding noted  Goal of Therapy:  Anti-Xa level 0.6-1.2 units/ml 4hrs after LMWH dose given Monitor platelets by anticoagulation protocol: Yes   Plan:   Continue Lovenox 45 mg SQ q12h  CBC ordered daily.   Hessie Knows, PharmD, BCPS Pager [phone] 08/11/2012 8:43 AM

## 2012-08-11 NOTE — Progress Notes (Signed)
  Subjective: No nausea complaining of abdominal pain.  Hungry  Objective: Vital signs in last 24 hours: Temp:  [98.1 F (36.7 C)-99.4 F (37.4 C)] 98.1 F (36.7 C) (04/21 0624) Pulse Rate:  [81-97] 81 (04/21 0624) Resp:  [18-20] 20 (04/21 0624) BP: (102-130)/(63-87) 130/87 mmHg (04/21 0624) SpO2:  [90 %-98 %] 94 % (04/21 0624) Weight:  [88 lb 13.5 oz (40.3 kg)] 88 lb 13.5 oz (40.3 kg) (04/21 0710) Last BM Date: 08/09/12  Intake/Output from previous day: 04/20 0701 - 04/21 0700 In: 1528.6 [TPN:1528.6] Out: 2360 [Urine:2350; Drains:10] Intake/Output this shift: Total I/O In: -  Out: 1 [Urine:1]  Incision/Wound:clean dry intact.  Min scant drainage from drain.  Soft no peritonitis  Lab Results:   Recent Labs  08/10/12 0500 08/11/12 0430  WBC 12.3* 10.7*  HGB 7.8* 7.6*  HCT 22.7* 22.3*  PLT 623* 631*   BMET  Recent Labs  08/10/12 0500 08/11/12 0430  NA 131* 133*  K 4.0 4.0  CL 96 98  CO2 29 28  GLUCOSE 106* 116*  BUN 11 13  CREATININE 0.62 0.66  CALCIUM 9.3 9.5   PT/INR No results found for this basename: LABPROT, INR,  in the last 72 hours ABG No results found for this basename: PHART, PCO2, PO2, HCO3,  in the last 72 hours  Studies/Results: No results found.  Anti-infectives: Anti-infectives   Start     Dose/Rate Route Frequency Ordered Stop   08/05/12 1200  micafungin (MYCAMINE) 100 mg in sodium chloride 0.9 % 100 mL IVPB     100 mg 100 mL/hr over 1 Hours Intravenous Daily 08/05/12 1113     08/05/12 0500  piperacillin-tazobactam (ZOSYN) IVPB 3.375 g     3.375 g 12.5 mL/hr over 240 Minutes Intravenous 3 times per day 08/05/12 0408        Assessment/Plan: see below  LOS: 7 days  Gastrocutaneous fistula  Output from drain minimal  Try clears PC malnutrition  Severe continue TNA PE  Per medicine start Xaralto once po intake better.    Elinora Weigand A. 08/11/2012

## 2012-08-11 NOTE — Progress Notes (Signed)
ANTIBIOTIC CONSULT NOTE - FOLLOW UP  Pharmacy Consult for Zosyn Indication: intra-abdominal infection  Allergies  Allergen Reactions  . Avelox (Moxifloxacin Hcl In Nacl) Nausea And Vomiting  . Alendronate Sodium Nausea And Vomiting  . Aspirin Nausea Only  . Codeine Nausea And Vomiting  . Doxycycline   . Fluconazole   . Hydrocodone Nausea And Vomiting    GI distress  . Neurontin (Gabapentin) Other (See Comments)    Mood changes   . Nsaids Other (See Comments)    Severe gastritis & perforation - avoid NSAIDs when possible  . Sertraline Hcl Other (See Comments)    Hallucinations   . Sulfa Antibiotics Rash    Patient Measurements: Height: 5\' 5"  (165.1 cm) Weight: 88 lb 13.5 oz (40.3 kg) IBW/kg (Calculated) : 57  Vital Signs: Temp: 98.1 F (36.7 C) (04/21 0624) Temp src: Oral (04/21 0624) BP: 130/87 mmHg (04/21 0624) Pulse Rate: 81 (04/21 0624) Intake/Output from previous day: 04/20 0701 - 04/21 0700 In: 1528.6 [TPN:1528.6] Out: 2360 [Urine:2350; Drains:10] Intake/Output from this shift: Total I/O In: -  Out: 1 [Urine:1]  Labs:  Recent Labs  08/09/12 0349 08/10/12 0500 08/11/12 0430  WBC 13.3* 12.3* 10.7*  HGB 7.2* 7.8* 7.6*  PLT 582* 623* 631*  CREATININE 0.57 0.62 0.66   Estimated Creatinine Clearance: 44.6 ml/min (by C-G formula based on Cr of 0.66).  Microbiology: Recent Results (from the past 720 hour(s))  URINE CULTURE     Status: None   Collection Time    07/13/12  8:35 AM      Result Value Range Status   Specimen Description URINE, RANDOM   Final   Special Requests NONE   Final   Culture  Setup Time 07/13/2012 18:05   Final   Colony Count 8,000 COLONIES/ML   Final   Culture INSIGNIFICANT GROWTH   Final   Report Status 07/14/2012 FINAL   Final  URINE CULTURE     Status: None   Collection Time    08/04/12 11:03 PM      Result Value Range Status   Specimen Description URINE, CATHETERIZED   Final   Special Requests NONE   Final   Culture   Setup Time 08/05/2012 07:00   Final   Colony Count NO GROWTH   Final   Culture NO GROWTH   Final   Report Status 08/06/2012 FINAL   Final  CULTURE, BLOOD (ROUTINE X 2)     Status: None   Collection Time    08/05/12  4:25 AM      Result Value Range Status   Specimen Description BLOOD LEFT ARM   Final   Special Requests BOTTLES DRAWN AEROBIC ONLY 1 CC   Final   Culture  Setup Time 08/05/2012 08:32   Final   Culture NO GROWTH 5 DAYS   Final   Report Status 08/11/2012 FINAL   Final  CULTURE, BLOOD (ROUTINE X 2)     Status: None   Collection Time    08/05/12  4:30 AM      Result Value Range Status   Specimen Description BLOOD LEFT WRIST   Final   Special Requests BOTTLES DRAWN AEROBIC ONLY 2CC   Final   Culture  Setup Time 08/05/2012 08:32   Final   Culture NO GROWTH 5 DAYS   Final   Report Status 08/11/2012 FINAL   Final  GRAM STAIN     Status: None   Collection Time    08/05/12  8:50 AM  Result Value Range Status   Specimen Description ABDOMEN JP FLUID   Final   Special Requests Normal   Final   Gram Stain     Final   Value: CYTOSPIN     WBC PRESENT,BOTH PMN AND MONONUCLEAR     YEAST     GRAM NEGATIVE RODS     GRAM POSITIVE COCCI IN CHAINS IN PAIRS   Report Status 08/05/2012 FINAL   Final  WOUND CULTURE     Status: None   Collection Time    08/05/12  8:50 AM      Result Value Range Status   Specimen Description DRAINAGE JP   Final   Special Requests Normal   Final   Gram Stain     Final   Value: WBC PRESENT,BOTH PMN AND MONONUCLEAR     YEAST     GRAM NEGATIVE RODS     GRAM POSITIVE COCCI     IN PAIRS IN CHAINS Gram Stain Report Called to,Read Back By and Verified With: Gram Stain Report Called to,Read Back By and Verified With: J CONRAD RN 1000 08/05/12 BY SHUEA Performed by [facility] CYTOSPIN SLIDE   Culture MODERATE CANDIDA ALBICANS   Final   Report Status 08/11/2012 FINAL   Final  ANAEROBIC CULTURE     Status: None   Collection Time    08/05/12  8:50  AM      Result Value Range Status   Specimen Description DRAINAGE JP   Final   Special Requests Normal   Final   Gram Stain     Final   Value: NO WBC SEEN     NO SQUAMOUS EPITHELIAL CELLS SEEN     NO ORGANISMS SEEN   Culture NO ANAEROBES ISOLATED   Final   Report Status 08/10/2012 FINAL   Final    Anti-infectives   Start     Dose/Rate Route Frequency Ordered Stop   08/05/12 1200  micafungin (MYCAMINE) 100 mg in sodium chloride 0.9 % 100 mL IVPB     100 mg 100 mL/hr over 1 Hours Intravenous Daily 08/05/12 1113     08/05/12 0500  piperacillin-tazobactam (ZOSYN) IVPB 3.375 g     3.375 g 12.5 mL/hr over 240 Minutes Intravenous 3 times per day 08/05/12 0408        Assessment: 66 y/oF with several recent hospitalizations 3/12-3/14 for supraumbilical hernia repair and gastric biopsy, then admitted with gastric perforation s/p repair on 07/13/12 and home on TPN 07/26/12 (started 3/24). She presents with SOB (+PE) and abd pain with CT revealing recurrent leak.  Pt on bowel rest and TNA and antibiotics for abdominal coverage.  Day #7 Zosyn (per pharmacy dosing) and Micafungin per Md dosing for intra-abdominal abscess/peritonitis.  Afebrile, WBC improving, SCr stable (CrCl~50 ml/min).  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  Hessie Knows, PharmD, BCPS Pager [phone] 08/11/2012 8:42 AM

## 2012-08-11 NOTE — Progress Notes (Signed)
PATIENT DETAILS Name: Cynthia Bowen Age: 67 y.o. Sex: female Date of Birth: 1945-11-13 Admit Date: 08/04/2012 Admitting Physician Eduard Clos, MD RUE:[id number], [name], MD  Note-[facility] is primary team-Hospitalist providing consultation  Subjective: No major issues-starting clears  Assessment/Plan: Principal Problem:   PE (pulmonary embolism) -provoked VTE-recent numerous Laparotomies, prolonged hospitalizations, dehydration etc -c.w therapeutic Lovenox -change to xarelto when oral intake resumed -plan for treatment atleast for 6 months, will need to check hypercoagulable panel-when off anticoagulation-can be done in the outpatient setting  Active Problems:   COPD (chronic obstructive pulmonary disease) -lungs clear -prn albuterol    Protein-calorie malnutrition, severe -on TNA  Recurrent Gastric Leak -requiring 3 trips to the OR last admission in March -[facility]S following and managing-to start clears today -drain in place -on empiric Zosyn and Micafungin -drain cultures-Candida Albicans -blood cultures done on admission-neg  Anemia -2/2 to critical illness and now chronic disease -Hb holding steady  Hospitalist team will follow the patient throughout her Hospital stay. All PE and Medical issues will be addressed by our team.   Disposition: Remain inpatient  DVT Prophylaxis: Not needed as on therapeutic Lovenox   Code Status: Full code      PHYSICAL EXAM: Vital signs in last 24 hours: Filed Vitals:   08/10/12 1429 08/10/12 2149 08/11/12 0624 08/11/12 0710  BP: 102/63 117/66 130/87   Pulse: 97 88 81   Temp: 99.4 F (37.4 C) 98.4 F (36.9 C) 98.1 F (36.7 C)   TempSrc: Oral Oral Oral   Resp: 18 18 20    Height:      Weight:    40.3 kg (88 lb 13.5 oz)  SpO2: 90% 98% 94%     Weight change:  Filed Weights   08/09/12 0000 08/10/12 0700 08/11/12 0710  Weight: 45.1 kg (99 lb 6.8 oz) 43.3 kg (95 lb 7.4 oz) 40.3 kg (88 lb 13.5 oz)   Body mass  index is 14.78 kg/(m^2).   Gen Exam: Awake and alert with clear speech.   Neck: Supple, No JVD.  Chest: B/L Clear.  No rales or rhonchi CVS: S1 S2 Regular, no murmurs.  Abdomen: soft, BS +, non tender, non distended. Drain in place Extremities: no edema, lower extremities warm to touch. Neurologic: Non Focal.  Skin: No Rash.   Wounds: N/A.    Intake/Output from previous day:  Intake/Output Summary (Last 24 hours) at 08/11/12 1059 Last data filed at 08/11/12 1000  Gross per 24 hour  Intake 1298.92 ml  Output   1961 ml  Net -662.08 ml     LAB RESULTS: CBC  Recent Labs Lab 08/04/12 2320  08/06/12 0345  08/07/12 0410 08/08/12 0600 08/09/12 0349 08/10/12 0500 08/11/12 0430  WBC 28.8*  < > 19.1*  --  17.8* 16.6* 13.3* 12.3* 10.7*  HGB 9.2*  < > 7.7*  < > 7.5* 7.5* 7.2* 7.8* 7.6*  HCT 27.2*  < > 22.8*  < > 22.0* 21.5* 21.1* 22.7* 22.3*  PLT 478*  < > 458*  --  437* 511* 582* 623* 631*  MCV 81.0  < > 80.6  --  81.5 79.0 79.3 80.2 80.2  MCH 27.4  < > 27.2  --  27.8 27.6 27.1 27.6 27.3  MCHC 33.8  < > 33.8  --  34.1 34.9 34.1 34.4 34.1  RDW 15.5  < > 15.7*  --  15.9* 15.9* 16.0* 16.1* 16.2*  LYMPHSABS 2.0  --  1.4  --   --   --   --   --  1.7  MONOABS 2.0*  --  1.5*  --   --   --   --   --  1.3*  EOSABS 0.0  --  0.1  --   --   --   --   --  0.2  BASOSABS 0.0  --  0.0  --   --   --   --   --  0.0  < > = values in this interval not displayed.  Chemistries   Recent Labs Lab 08/07/12 0410 08/08/12 0600 08/09/12 0349 08/10/12 0500 08/11/12 0430  NA 139 137 129* 131* 133*  K 3.1* 4.2 4.1 4.0 4.0  CL 107 105 97 96 98  CO2 26 25 26 29 28   GLUCOSE 119* 114* 112* 106* 116*  BUN 17 11 10 11 13   CREATININE 0.57 0.55 0.57 0.62 0.66  CALCIUM 8.6 8.5 8.8 9.3 9.5  MG 1.8 1.8 1.6 1.7 1.8    CBG:  Recent Labs Lab 08/09/12 2352 08/10/12 0607 08/10/12 1153 08/10/12 1820 08/11/12 0640  GLUCAP 112* 116* 121* 113* 119*    GFR Estimated Creatinine Clearance: 44.6  ml/min (by C-G formula based on Cr of 0.66).  Coagulation profile  Recent Labs Lab 08/05/12 0318  INR 1.28    Cardiac Enzymes  Recent Labs Lab 08/08/12 0740 08/08/12 1341 08/08/12 1900  TROPONINI <0.30 <0.30 <0.30    No components found with this basename: POCBNP,  No results found for this basename: DDIMER,  in the last 72 hours No results found for this basename: HGBA1C,  in the last 72 hours  Recent Labs  08/11/12 0430  CHOL 81  TRIG 97   No results found for this basename: TSH, T4TOTAL, FREET3, T3FREE, THYROIDAB,  in the last 72 hours No results found for this basename: VITAMINB12, FOLATE, FERRITIN, TIBC, IRON, RETICCTPCT,  in the last 72 hours No results found for this basename: LIPASE, AMYLASE,  in the last 72 hours  Urine Studies No results found for this basename: UACOL, UAPR, USPG, UPH, UTP, UGL, UKET, UBIL, UHGB, UNIT, UROB, ULEU, UEPI, UWBC, URBC, UBAC, CAST, CRYS, UCOM, BILUA,  in the last 72 hours  MICROBIOLOGY: Recent Results (from the past 240 hour(s))  URINE CULTURE     Status: None   Collection Time    08/04/12 11:03 PM      Result Value Range Status   Specimen Description URINE, CATHETERIZED   Final   Special Requests NONE   Final   Culture  Setup Time 08/05/2012 07:00   Final   Colony Count NO GROWTH   Final   Culture NO GROWTH   Final   Report Status 08/06/2012 FINAL   Final  CULTURE, BLOOD (ROUTINE X 2)     Status: None   Collection Time    08/05/12  4:25 AM      Result Value Range Status   Specimen Description BLOOD LEFT ARM   Final   Special Requests BOTTLES DRAWN AEROBIC ONLY 1 CC   Final   Culture  Setup Time 08/05/2012 08:32   Final   Culture NO GROWTH 5 DAYS   Final   Report Status 08/11/2012 FINAL   Final  CULTURE, BLOOD (ROUTINE X 2)     Status: None   Collection Time    08/05/12  4:30 AM      Result Value Range Status   Specimen Description BLOOD LEFT WRIST   Final   Special Requests BOTTLES DRAWN AEROBIC ONLY 2CC   Final  Culture  Setup Time 08/05/2012 08:32   Final   Culture NO GROWTH 5 DAYS   Final   Report Status 08/11/2012 FINAL   Final  GRAM STAIN     Status: None   Collection Time    08/05/12  8:50 AM      Result Value Range Status   Specimen Description ABDOMEN JP FLUID   Final   Special Requests Normal   Final   Gram Stain     Final   Value: CYTOSPIN     WBC PRESENT,BOTH PMN AND MONONUCLEAR     YEAST     GRAM NEGATIVE RODS     GRAM POSITIVE COCCI IN CHAINS IN PAIRS   Report Status 08/05/2012 FINAL   Final  WOUND CULTURE     Status: None   Collection Time    08/05/12  8:50 AM      Result Value Range Status   Specimen Description DRAINAGE JP   Final   Special Requests Normal   Final   Gram Stain     Final   Value: WBC PRESENT,BOTH PMN AND MONONUCLEAR     YEAST     GRAM NEGATIVE RODS     GRAM POSITIVE COCCI     IN PAIRS IN CHAINS Gram Stain Report Called to,Read Back By and Verified With: Gram Stain Report Called to,Read Back By and Verified With: J Renata Caprice RN 1000 08/05/12 BY SHUEA Performed by [facility] CYTOSPIN SLIDE   Culture MODERATE CANDIDA ALBICANS   Final   Report Status 08/11/2012 FINAL   Final  ANAEROBIC CULTURE     Status: None   Collection Time    08/05/12  8:50 AM      Result Value Range Status   Specimen Description DRAINAGE JP   Final   Special Requests Normal   Final   Gram Stain     Final   Value: NO WBC SEEN     NO SQUAMOUS EPITHELIAL CELLS SEEN     NO ORGANISMS SEEN   Culture NO ANAEROBES ISOLATED   Final   Report Status 08/10/2012 FINAL   Final    RADIOLOGY STUDIES/RESULTS: Dg Chest 2 View  07/13/2012  *RADIOLOGY REPORT*  Clinical Data:    Cough, abdominal pain.  CHEST - 2 VIEW  Comparison: 07/07/2012  Findings: Small left pleural effusion appears slightly increased. Some worsening of adjacent consolidation / atelectasis at the left lung base.  Right lungs clear.  Heart size normal.  Cervical fixation hardware noted.  IMPRESSION:  1.  Some interval  increase in left lower lung consolidation / atelectasis with adjacent effusion.   Original Report Authenticated By: D. Andria Rhein, MD    Ct Head Wo Contrast  08/06/2012  *RADIOLOGY REPORT*  Clinical Data: Altered mental status.  CT HEAD WITHOUT CONTRAST  Technique:  Contiguous axial images were obtained from the base of the skull through the vertex without contrast.  Comparison: None.  Findings: There is no evidence of acute intracranial abnormality including infarction, hemorrhage, midline shift, abnormal extra- axial fluid collection, mass, mass effect, midline shift or hydrocephalus is identified.  There is no pneumocephalus. The calvarium is intact.  Imaged paranasal sinuses and mastoid air cells are clear.  IMPRESSION: No acute finding.   Original Report Authenticated By: Holley Dexter, M.D.    Ct Angio Chest Pe W/cm &/or Wo Cm  08/05/2012  *RADIOLOGY REPORT*  Clinical Data: Shortness of breath.  Sharp left chest pain.  CT ANGIOGRAPHY CHEST  Technique:  Multidetector CT imaging of the chest using the standard protocol during bolus administration of intravenous contrast. Multiplanar reconstructed images including MIPs were obtained and reviewed to evaluate the vascular anatomy.  Contrast: 80mL OMNIPAQUE IOHEXOL 350 MG/ML SOLN  Comparison: CT scan of the chest dated 04/10/2012 and CT scan of the abdomen dated 07/13/2012  Findings: There is an extensive large pulmonary embolus in the pulmonary artery to the left lower lobe.  There is secondary extensive left lower lobe consolidation and interstitial accentuation which may represent pulmonary infarction. Tiny emboli in the proximal portion of the pulmonary artery to the left upper lobe.  This is superimposed on diffuse emphysema.  Heart size is normal. No hilar mediastinal adenopathy.  The right lung is clear except for some slight atelectasis at the right base.  There is a small chronic pericardial effusion.  No acute osseous abnormality.  The thoracic  aorta is normal except for slight calcification. Origins of the brachiocephalic vessels are widely patent.  IMPRESSION:  1.  Large extensive pulmonary embolus in the left lower lobe pulmonary artery.  Smaller emboli in the left upper lobe pulmonary artery. 2.  Possible infarction in the left lower lobe. 3.  Diffuse emphysema.   Original Report Authenticated By: Francene Boyers, M.D.    Ct Abdomen Pelvis W Contrast  08/05/2012  *RADIOLOGY REPORT*  Clinical Data: Abdominal pain.  Recent gastric perforation.  CT ABDOMEN AND PELVIS WITH CONTRAST  Technique:  Multidetector CT imaging of the abdomen and pelvis was performed following the standard protocol during bolus administration of intravenous contrast.  Contrast: 80mL OMNIPAQUE IOHEXOL 350 MG/ML SOLN  Comparison: CT scan dated 07/13/2012  Findings: Drain is present in the upper abdomen anterior to the antrum of the stomach.  There are a few small extraluminal air fluid collections at that site, slightly diminished. There is an area of what is probably residual contrast and a there are fluid collection posterior to the left lobe of the liver seen on image number 26 of series 7.  Thickening of the mucosa of the distal stomach is unchanged. There are a few extraluminal air bubbles anterior to the distal antrum.  I cannot exclude a recurrent perforation based on these images.  Liver, spleen, pancreas, adrenal glands, and kidneys appear stable. Bilateral renal cysts and right renal calculi are unchanged.  No dilated large or small bowel.  Stable 2.5 cm cyst on the right ovary.  IMPRESSION: Interval slight decrease in the fluid and air collections anterior to the stomach adjacent to the soft tissue drain. Persistent or recurrent contrast in the fluid collections anterior to the stomach.  This could represent a recurrent leak.  No other change.   Original Report Authenticated By: Francene Boyers, M.D.    Ct Abdomen Pelvis W Contrast  07/13/2012  *RADIOLOGY REPORT*   Clinical Data: Postop abdominal pain, question abscess.  Elevated white blood cell count.  CT ABDOMEN AND PELVIS WITH CONTRAST  Technique:  Multidetector CT imaging of the abdomen and pelvis was performed following the standard protocol during bolus administration of intravenous contrast.  Contrast: OMNIPAQUE IOHEXOL 300 MG/ML  SOLN  Comparison: Upper GI 07/10/2012, CT 07/07/2012  Findings: Superior to the gastric body, adjacent to the surgical drain, there is an air fluid collection which is increased in volume compared to prior.  This measures 4.5 x 3.1 cm compared to 2.4 x 1.3 cm on prior.   This is at the site of the prior perforation.  This gas and fluid collection does  contains oral contrast.  Difficult to say if this is from the current exam or prior exam.  There is communication with the more superior air fluid collection beneath the left hepatic margin which measures 4.2 x 2.4 cm which is new from prior.  The stomach itself is grossly normal.  There is thickening through the gastric antrum and  the first portion the duodenum.  There is a potential track from the  stomach to the fluid collection seen on sagittal image 74 (series 6).  Oral contrast flows into the small bowel without evidence obstruction.  The colon has  a moderate volume stool.  The rectum is normal.  There is a new left pleural effusion with associated passive atelectasis.  No pericardial fluid.  There is no focal hepatic lesion. There is small amount gas along the falciform ligament related to the gas collection as described above.  The pancreas, spleen, adrenal glands, and kidneys are unchanged.  There are callus calculi within the right kidney.  No significant free fluid the pelvis.  There is a cyst associated with the right ovary unchanged.  Bladder and uterus are normal. Uterus is absent  IMPRESSION:  1.  Interval increase in volume of fluid collection at the site of prior perforation.  There is an air-fluid level with oral  contrast at this level.  Suspicion for continued leak or re perforation. Surgical drain adjacent to the collection.  2.  Extension from the dominant fluid collection into a new collection more superior along the posterior aspect the left hepatic lobe.  These gas collections are seen on the CT scout.  3.  New   left pleural effusion and atelectasis.  Findings conveyed to on-call surgeon Dr.  Johna Sheriff through the OR nurse on 07/13/2012 at 11 :15 am   Original Report Authenticated By: Genevive Bi, M.D.    Dg Chest Port 1 View  08/09/2012  *RADIOLOGY REPORT*  Clinical Data: Pulmonary embolism.  Short of breath.  Cough.  PORTABLE CHEST - 1 VIEW  Comparison: 08/07/2012 radiograph.  08/05/2012 CT.  Findings: Right upper extremity PICC unchanged.  Cardiomegaly is partially obscured by airspace disease at the left base.  The left basilar airspace disease and probable effusion appears little changed compared to prior exam.  Right pleural effusion is present. Suspect underlying emphysema.  Partially visualized cervical ACDF. No pneumothorax.  IMPRESSION: No interval change.  Left basilar collapse / consolidation with probable left pleural effusion.   Original Report Authenticated By: Andreas Newport, M.D.    Dg Chest Port 1 View  08/07/2012  *RADIOLOGY REPORT*  Clinical Data: Follow-up left lower lobe infiltrate.  Pulmonary embolism.  PORTABLE CHEST - 1 VIEW  Comparison: CT 08/05/2012.  Radiographs 08/04/2012.  Findings: 0512 hours.  The right arm PICC is unchanged at the SVC right atrial level.  There is stable cardiomegaly.  There has been some worsening of the left lower lobe air space disease.  There are probable small bilateral pleural effusions.  No pneumothorax is seen.  IMPRESSION: Slight worsening of left lower lobe air space disease.  This could reflect pneumonia or pulmonary infarction given the prior CT findings.   Original Report Authenticated By: Carey Bullocks, M.D.    Dg Chest Port 1  View  08/04/2012  *RADIOLOGY REPORT*  Clinical Data: Chest pain and cough.  PORTABLE CHEST - 1 VIEW  Comparison: Chest CT 07/13/2012.  Findings: The cardiac silhouette, mediastinal and hilar contours are stable.  Persistent left lower lobe process appears to be a  combination of a pleural effusion and atelectasis.  Minimal right basilar atelectasis also.  Slight increased interstitial markings could be due to bronchitis or interstitial pneumonitis.  The right PICC line tip is in the distal SVC.  The bony thorax is intact.  IMPRESSION: Persistent left lower lobe process. Possible bronchitis or interstitial pneumonitis.   Original Report Authenticated By: [name], M.D.    Dg Kayleen Memos W/water Sol Cm  07/22/2012  *RADIOLOGY REPORT*  Clinical Data:  Status post gastric perforation with recurrent leak.  Increased volume of drainage from surgical drains.  UPPER GI SERIES WITH KUB  Technique:  Focused upper GI was performed with one 120 ml of Omnipaque contrast. Fluoroscopy Time: 6.2 minutes  Comparison:  07/15/2012 and CT of 07/13/2012  Findings: Pre procedure scout film demonstrates bilateral surgical drains.  Nonobstructive bowel gas pattern, without gross free intraperitoneal air.  Postcontrast images demonstrate normal caliber of the stomach. There is irregularity about the gastric antrum.  No gross contrast extravasation is seen.  No well-defined perigastric contrast collection. Suspicion of extraluminal contrast adjacent the gastric antrum and pylorus on series 77.  Most apparent on series 31, is contrast entering the right-sided surgical drain.  This results in increased density within the drain on the final postprocedure radiograph.  IMPRESSION: Subtle findings which are consistent with contrast leak, presumably from the gastric antrum or pylorus, with small volume contrast draining into the right-sided surgical drain (only readily apparent on image/series 81).  These results will be called to the ordering  clinician or representative by the Radiologist Assistant, and communication documented in the PACS Dashboard.   Original Report Authenticated By: Jeronimo Greaves, M.D.    Dg Kayleen Memos W/water Sol Cm  07/15/2012  *RADIOLOGY REPORT*  Clinical Data:  History of recurrent gastric leakage post repair.  UPPER GI SERIES WITH KUB  Technique:100 ml of Omnipaque-300 was injected through the enteric tube filling the stomach with water soluble contrast.  The patient was evaluated in the supine, left lateral, right lateral, and oblique positions.  Fluoroscopy Time: 2.23 minutes  Comparison:  CT 07/14/2002  Findings: : Initial abdominal image shows residual contrast within the colon from prior CT.  Surgical drain is in place entering from the right.  After water soluble contrast was injected into the stomach the patient was examined in multiple positions.  Contrast flowed from the stomach into the duodenum.  Gastroesophageal reflux was seen with contrast in the esophagus.  However, there is no leakage of contrast from the gastrointestinal tract.  No perforation was demonstrated.  IMPRESSION: No gastrointestinal perforation was demonstrated.   Original Report Authenticated By: Onalee Hua Call     MEDICATIONS: Scheduled Meds: . chlorhexidine  15 mL Mouth Rinse BID  . enoxaparin (LOVENOX) injection  1 mg/kg Subcutaneous Q12H  . insulin aspart  0-9 Units Subcutaneous Q12H  . micafungin (MYCAMINE) IV  100 mg Intravenous Daily  . pantoprazole (PROTONIX) IV  40 mg Intravenous Q12H  . piperacillin-tazobactam (ZOSYN)  IV  3.375 g Intravenous Q8H  . sodium chloride  3 mL Intravenous Q12H   Continuous Infusions: . Marland KitchenTPN (CLINIMIX-E) Adult 65 mL/hr at 08/10/12 1722  . Marland KitchenTPN (CLINIMIX-E) Adult     And  . fat emulsion     PRN Meds:.acetaminophen, acetaminophen, albuterol, diphenhydrAMINE, morphine injection, ondansetron (ZOFRAN) IV, ondansetron, promethazine, sodium chloride  Antibiotics: Anti-infectives   Start     Dose/Rate Route  Frequency Ordered Stop   08/05/12 1200  micafungin (MYCAMINE) 100 mg in sodium chloride 0.9 % 100  mL IVPB     100 mg 100 mL/hr over 1 Hours Intravenous Daily 08/05/12 1113     08/05/12 0500  piperacillin-tazobactam (ZOSYN) IVPB 3.375 g     3.375 g 12.5 mL/hr over 240 Minutes Intravenous 3 times per day 08/05/12 Natividad Brood, MD  [facility] Pager:[phone]  If 7PM-7AM, please contact night-coverage [email or web address] Password [email or web address] 08/11/2012, 10:59 AM   LOS: 7 days

## 2012-08-12 LAB — COMPREHENSIVE METABOLIC PANEL
ALT: 25 U/L (ref 0–35)
Albumin: 1.9 g/dL — ABNORMAL LOW (ref 3.5–5.2)
Calcium: 9.3 mg/dL (ref 8.4–10.5)
GFR calc Af Amer: >90 mL/min (ref >90)
Glucose, Bld: 99 mg/dL (ref 70–99)
Potassium: 4.1 mEq/L (ref 3.5–5.1)
Sodium: 132 mEq/L — ABNORMAL LOW (ref 135–145)
Total Protein: 7.8 g/dL (ref 6.0–8.3)

## 2012-08-12 LAB — CBC
Hemoglobin: 7.5 g/dL — ABNORMAL LOW (ref 12.0–15.0)
MCH: 26.3 pg (ref 26.0–34.0)
MCHC: 32.5 g/dL (ref 30.0–36.0)
Platelets: 680 10*3/uL — ABNORMAL HIGH (ref 150–400)
RDW: 16.2 % — ABNORMAL HIGH (ref 11.5–15.5)

## 2012-08-12 LAB — GLUCOSE, CAPILLARY: Glucose-Capillary: 120 mg/dL — ABNORMAL HIGH (ref 70–99)

## 2012-08-12 MED ORDER — INSULIN ASPART 100 UNIT/ML ~~LOC~~ SOLN
0.0000 [IU] | Freq: Three times a day (TID) | SUBCUTANEOUS | Status: DC
  Administered 2012-08-14: 1 [IU] via SUBCUTANEOUS

## 2012-08-12 MED ORDER — CLINIMIX E/DEXTROSE (5/20) 5 % IV SOLN
INTRAVENOUS | Status: AC
  Administered 2012-08-12 – 2012-08-13 (×2): via INTRAVENOUS
  Filled 2012-08-12: qty 2000

## 2012-08-12 NOTE — Evaluation (Signed)
Occupational Therapy Evaluation Patient Details Name: Cynthia Bowen MRN: [id number] DOB: 10/31/1945 Today's Date: 08/12/2012 Time: 4696-2952 OT Time Calculation (min): 22 min  OT Assessment / Plan / Recommendation Clinical Impression  Pt presents to OT at overall S with ADL activity ( S due to multiple lines). Pt does not feel she needs further OT.     OT Assessment  Patient does not need any further OT services                   Precautions / Restrictions Precautions Precaution Comments: multiple lines Restrictions Weight Bearing Restrictions: No       ADL  Grooming: Simulated;Wash/dry face;Supervision/safety Where Assessed - Grooming: Unsupported standing Upper Body Bathing: Simulated;Set up Where Assessed - Upper Body Bathing: Unsupported sitting Lower Body Bathing: Simulated;Set up Where Assessed - Lower Body Bathing: Unsupported sit to stand Upper Body Dressing: Simulated;Set up Where Assessed - Upper Body Dressing: Unsupported sit to stand Lower Body Dressing: Simulated;Set up Where Assessed - Lower Body Dressing: Unsupported sit to stand Toilet Transfer: Simulated;Supervision/safety Toilet Transfer Method: Sit to stand;Other (comment) (chair - ambulate - then back to chair) Transfers/Ambulation Related to ADLs: Pt with very flat affect with OT this session. Depending onprogress and help at home she may need SNF ADL Comments: Pt doing well!  Pt motivated to get well.         Visit Information  Last OT Received On: 08/12/12    Subjective Data  Subjective: i can do all that my self   Prior Functioning     Home Living Lives With: Family (nephew) Type of Home: Mobile home Home Access: Stairs to enter Entrance Stairs-Number of Steps: 3 Entrance Stairs-Rails: Right;Left Home Layout: One level Bathroom Shower/Tub: Health visitor: Standard Home Adaptive Equipment: Straight cane Prior Function Level of Independence: Needs assistance Able  to Take Stairs?: Yes Vocation: Retired Musician: No difficulties         Vision/Perception Vision - History Patient Visual Report: No change from baseline   Cognition  Cognition Arousal/Alertness: Awake/alert Behavior During Therapy: WFL for tasks assessed/performed Overall Cognitive Status: Within Functional Limits for tasks assessed    Extremity/Trunk Assessment Right Upper Extremity Assessment RUE ROM/Strength/Tone: Within functional levels Left Upper Extremity Assessment LUE ROM/Strength/Tone: Within functional levels     Mobility Transfers Transfers: Sit to Stand;Stand to Sit Sit to Stand: 5: Supervision;With upper extremity assist;From chair/3-in-1 Stand to Sit: 5: Supervision;With upper extremity assist;To chair/3-in-1           End of Session OT - End of Session Patient left: in chair;with call bell/phone within reach  GO     Cynthia Bowen, [place]ung 08/12/2012, 12:12 PM

## 2012-08-12 NOTE — Progress Notes (Signed)
08/12/2012 Angie Fava BSN RN CCM [phone] CM spoke with patient 04/21 regarding d/c planning. States she is from home where her nephrew is caregiver. Pt has declined SNF but will consider [facility] at [facility] if it"s recommended by her surgeon. She plans to talk with him regarding this. Discussed case in long length of stay meeting.

## 2012-08-12 NOTE — Progress Notes (Signed)
PATIENT DETAILS Name: Cynthia Bowen Age: 67 y.o. Sex: female Date of Birth: [birth year] Admit Date: 08/04/2012 Admitting Physician Eduard Clos, MD ZOX:[id number], [name], MD  Note-[facility]S is primary team-Hospitalist providing consultation  Subjective: No major issues-apparently had increased drainage discharge and pain upon starting clears yesterday  Assessment/Plan: Principal Problem:   PE (pulmonary embolism) -provoked VTE-recent numerous Laparotomies, prolonged hospitalizations, dehydration etc -c.w therapeutic Lovenox-reviewed Dr Fatima Sanger note today-he anticipates patient being on TNA/just sips for the next few weeks-hence Xarelto cannot be started, patient likely will need Lovenox at therapeutic dosing on discharge. Can initiate Xarelto in the outpatient setting-once oral intake resumes.  -Start lovenox teaching -plan for treatment atleast for 6 months, will need to check hypercoagulable panel-when off anticoagulation-can be done in the outpatient setting -since medical issues are stable-and anticipated discharge is Thursday- Hospitalist service will sign off-please call with questions-will need Lovenox 45 mg SQ Q12 on discharge.  Active Problems:   COPD (chronic obstructive pulmonary disease) -lungs clear -prn albuterol    Protein-calorie malnutrition, severe -on TNA  Recurrent Gastric Leak -requiring 3 trips to the OR last admission in March -CCS following and managing-to start clears today -drain in place -on empiric Zosyn and Micafungin -drain cultures-Candida Albicans -blood cultures done on admission-neg  Anemia -2/2 to critical illness and now chronic disease -Hb holding steady  Hospitalist team will sign off 4/22.   Disposition: Remain inpatient  DVT Prophylaxis: Not needed as on therapeutic Lovenox   Code Status: Full code      PHYSICAL EXAM: Vital signs in last 24 hours: Filed Vitals:   08/11/12 0710 08/11/12 1400 08/11/12 2215 08/12/12  0608  BP:  109/63 106/66 119/72  Pulse:  69 78 83  Temp:  98.4 F (36.9 C) 98.4 F (36.9 C) 97.9 F (36.6 C)  TempSrc:  Oral Oral Oral  Resp:  18 18 18   Height:      Weight: 40.3 kg (88 lb 13.5 oz)     SpO2:  96% 97% 99%    Weight change:  Filed Weights   08/09/12 0000 08/10/12 0700 08/11/12 0710  Weight: 45.1 kg (99 lb 6.8 oz) 43.3 kg (95 lb 7.4 oz) 40.3 kg (88 lb 13.5 oz)   Body mass index is 14.78 kg/(m^2).   Gen Exam: Awake and alert with clear speech.   Neck: Supple, No JVD.  Chest: B/L Clear.  No rales or rhonchi CVS: S1 S2 Regular, no murmurs.  Abdomen: soft, BS +, non tender, non distended. Drain in place Extremities: no edema, lower extremities warm to touch. Neurologic: Non Focal.  Skin: No Rash.   Wounds: N/A.    Intake/Output from previous day:  Intake/Output Summary (Last 24 hours) at 08/12/12 1338 Last data filed at 08/12/12 1000  Gross per 24 hour  Intake 2608.08 ml  Output   4510 ml  Net -1901.92 ml     LAB RESULTS: CBC  Recent Labs Lab 08/06/12 0345  08/08/12 0600 08/09/12 0349 08/10/12 0500 08/11/12 0430 08/12/12 0500  WBC 19.1*  < > 16.6* 13.3* 12.3* 10.7* 10.7*  HGB 7.7*  < > 7.5* 7.2* 7.8* 7.6* 7.5*  HCT 22.8*  < > 21.5* 21.1* 22.7* 22.3* 23.1*  PLT 458*  < > 511* 582* 623* 631* 680*  MCV 80.6  < > 79.0 79.3 80.2 80.2 81.1  MCH 27.2  < > 27.6 27.1 27.6 27.3 26.3  MCHC 33.8  < > 34.9 34.1 34.4 34.1 32.5  RDW 15.7*  < >  15.9* 16.0* 16.1* 16.2* 16.2*  LYMPHSABS 1.4  --   --   --   --  1.7  --   MONOABS 1.5*  --   --   --   --  1.3*  --   EOSABS 0.1  --   --   --   --  0.2  --   BASOSABS 0.0  --   --   --   --  0.0  --   < > = values in this interval not displayed.  Chemistries   Recent Labs Lab 08/08/12 0600 08/09/12 0349 08/10/12 0500 08/11/12 0430 08/12/12 0500  NA 137 129* 131* 133* 132*  K 4.2 4.1 4.0 4.0 4.1  CL 105 97 96 98 98  CO2 25 26 29 28 26   GLUCOSE 114* 112* 106* 116* 99  BUN 11 10 11 13 12   CREATININE  0.55 0.57 0.62 0.66 0.69  CALCIUM 8.5 8.8 9.3 9.5 9.3  MG 1.8 1.6 1.7 1.8 1.9    CBG:  Recent Labs Lab 08/10/12 1153 08/10/12 1820 08/11/12 0640 08/11/12 1802 08/12/12 0616  GLUCAP 121* 113* 119* 110* 120*    GFR Estimated Creatinine Clearance: 44.6 ml/min (by C-G formula based on Cr of 0.69).  Coagulation profile No results found for this basename: INR, PROTIME,  in the last 168 hours  Cardiac Enzymes  Recent Labs Lab 08/08/12 0740 08/08/12 1341 08/08/12 1900  TROPONINI <0.30 <0.30 <0.30    No components found with this basename: POCBNP,  No results found for this basename: DDIMER,  in the last 72 hours No results found for this basename: HGBA1C,  in the last 72 hours  Recent Labs  08/11/12 0430  CHOL 81  TRIG 97   No results found for this basename: TSH, T4TOTAL, FREET3, T3FREE, THYROIDAB,  in the last 72 hours No results found for this basename: VITAMINB12, FOLATE, FERRITIN, TIBC, IRON, RETICCTPCT,  in the last 72 hours No results found for this basename: LIPASE, AMYLASE,  in the last 72 hours  Urine Studies No results found for this basename: UACOL, UAPR, USPG, UPH, UTP, UGL, UKET, UBIL, UHGB, UNIT, UROB, ULEU, UEPI, UWBC, URBC, UBAC, CAST, CRYS, UCOM, BILUA,  in the last 72 hours  MICROBIOLOGY: Recent Results (from the past 240 hour(s))  URINE CULTURE     Status: None   Collection Time    08/04/12 11:03 PM      Result Value Range Status   Specimen Description URINE, CATHETERIZED   Final   Special Requests NONE   Final   Culture  Setup Time 08/05/2012 07:00   Final   Colony Count NO GROWTH   Final   Culture NO GROWTH   Final   Report Status 08/06/2012 FINAL   Final  CULTURE, BLOOD (ROUTINE X 2)     Status: None   Collection Time    08/05/12  4:25 AM      Result Value Range Status   Specimen Description BLOOD LEFT ARM   Final   Special Requests BOTTLES DRAWN AEROBIC ONLY 1 CC   Final   Culture  Setup Time 08/05/2012 08:32   Final   Culture NO  GROWTH 5 DAYS   Final   Report Status 08/11/2012 FINAL   Final  CULTURE, BLOOD (ROUTINE X 2)     Status: None   Collection Time    08/05/12  4:30 AM      Result Value Range Status   Specimen Description BLOOD LEFT WRIST  Final   Special Requests BOTTLES DRAWN AEROBIC ONLY 2CC   Final   Culture  Setup Time 08/05/2012 08:32   Final   Culture NO GROWTH 5 DAYS   Final   Report Status 08/11/2012 FINAL   Final  GRAM STAIN     Status: None   Collection Time    08/05/12  8:50 AM      Result Value Range Status   Specimen Description ABDOMEN JP FLUID   Final   Special Requests Normal   Final   Gram Stain     Final   Value: CYTOSPIN     WBC PRESENT,BOTH PMN AND MONONUCLEAR     YEAST     GRAM NEGATIVE RODS     GRAM POSITIVE COCCI IN CHAINS IN PAIRS   Report Status 08/05/2012 FINAL   Final  WOUND CULTURE     Status: None   Collection Time    08/05/12  8:50 AM      Result Value Range Status   Specimen Description DRAINAGE JP   Final   Special Requests Normal   Final   Gram Stain     Final   Value: WBC PRESENT,BOTH PMN AND MONONUCLEAR     YEAST     GRAM NEGATIVE RODS     GRAM POSITIVE COCCI     IN PAIRS IN CHAINS Gram Stain Report Called to,Read Back By and Verified With: Gram Stain Report Called to,Read Back By and Verified With: J Renata Caprice RN 1000 08/05/12 BY SHUEA Performed by [facility] CYTOSPIN SLIDE   Culture MODERATE CANDIDA ALBICANS   Final   Report Status 08/11/2012 FINAL   Final  ANAEROBIC CULTURE     Status: None   Collection Time    08/05/12  8:50 AM      Result Value Range Status   Specimen Description DRAINAGE JP   Final   Special Requests Normal   Final   Gram Stain     Final   Value: NO WBC SEEN     NO SQUAMOUS EPITHELIAL CELLS SEEN     NO ORGANISMS SEEN   Culture NO ANAEROBES ISOLATED   Final   Report Status 08/10/2012 FINAL   Final    RADIOLOGY STUDIES/RESULTS: Dg Chest 2 View  07/13/2012  *RADIOLOGY REPORT*  Clinical Data:    Cough, abdominal pain.   CHEST - 2 VIEW  Comparison: 07/07/2012  Findings: Small left pleural effusion appears slightly increased. Some worsening of adjacent consolidation / atelectasis at the left lung base.  Right lungs clear.  Heart size normal.  Cervical fixation hardware noted.  IMPRESSION:  1.  Some interval increase in left lower lung consolidation / atelectasis with adjacent effusion.   Original Report Authenticated By: D. Andria Rhein, MD    Ct Head Wo Contrast  08/06/2012  *RADIOLOGY REPORT*  Clinical Data: Altered mental status.  CT HEAD WITHOUT CONTRAST  Technique:  Contiguous axial images were obtained from the base of the skull through the vertex without contrast.  Comparison: None.  Findings: There is no evidence of acute intracranial abnormality including infarction, hemorrhage, midline shift, abnormal extra- axial fluid collection, mass, mass effect, midline shift or hydrocephalus is identified.  There is no pneumocephalus. The calvarium is intact.  Imaged paranasal sinuses and mastoid air cells are clear.  IMPRESSION: No acute finding.   Original Report Authenticated By: Holley Dexter, M.D.    Ct Angio Chest Pe W/cm &/or Wo Cm  08/05/2012  *RADIOLOGY REPORT*  Clinical  Data: Shortness of breath.  Sharp left chest pain.  CT ANGIOGRAPHY CHEST  Technique:  Multidetector CT imaging of the chest using the standard protocol during bolus administration of intravenous contrast. Multiplanar reconstructed images including MIPs were obtained and reviewed to evaluate the vascular anatomy.  Contrast: 80mL OMNIPAQUE IOHEXOL 350 MG/ML SOLN  Comparison: CT scan of the chest dated 04/10/2012 and CT scan of the abdomen dated 07/13/2012  Findings: There is an extensive large pulmonary embolus in the pulmonary artery to the left lower lobe.  There is secondary extensive left lower lobe consolidation and interstitial accentuation which may represent pulmonary infarction. Tiny emboli in the proximal portion of the pulmonary artery to the  left upper lobe.  This is superimposed on diffuse emphysema.  Heart size is normal. No hilar mediastinal adenopathy.  The right lung is clear except for some slight atelectasis at the right base.  There is a small chronic pericardial effusion.  No acute osseous abnormality.  The thoracic aorta is normal except for slight calcification. Origins of the brachiocephalic vessels are widely patent.  IMPRESSION:  1.  Large extensive pulmonary embolus in the left lower lobe pulmonary artery.  Smaller emboli in the left upper lobe pulmonary artery. 2.  Possible infarction in the left lower lobe. 3.  Diffuse emphysema.   Original Report Authenticated By: Francene Boyers, M.D.    Ct Abdomen Pelvis W Contrast  08/05/2012  *RADIOLOGY REPORT*  Clinical Data: Abdominal pain.  Recent gastric perforation.  CT ABDOMEN AND PELVIS WITH CONTRAST  Technique:  Multidetector CT imaging of the abdomen and pelvis was performed following the standard protocol during bolus administration of intravenous contrast.  Contrast: 80mL OMNIPAQUE IOHEXOL 350 MG/ML SOLN  Comparison: CT scan dated 07/13/2012  Findings: Drain is present in the upper abdomen anterior to the antrum of the stomach.  There are a few small extraluminal air fluid collections at that site, slightly diminished. There is an area of what is probably residual contrast and a there are fluid collection posterior to the left lobe of the liver seen on image number 26 of series 7.  Thickening of the mucosa of the distal stomach is unchanged. There are a few extraluminal air bubbles anterior to the distal antrum.  I cannot exclude a recurrent perforation based on these images.  Liver, spleen, pancreas, adrenal glands, and kidneys appear stable. Bilateral renal cysts and right renal calculi are unchanged.  No dilated large or small bowel.  Stable 2.5 cm cyst on the right ovary.  IMPRESSION: Interval slight decrease in the fluid and air collections anterior to the stomach adjacent to the  soft tissue drain. Persistent or recurrent contrast in the fluid collections anterior to the stomach.  This could represent a recurrent leak.  No other change.   Original Report Authenticated By: Francene Boyers, M.D.    Ct Abdomen Pelvis W Contrast  07/13/2012  *RADIOLOGY REPORT*  Clinical Data: Postop abdominal pain, question abscess.  Elevated white blood cell count.  CT ABDOMEN AND PELVIS WITH CONTRAST  Technique:  Multidetector CT imaging of the abdomen and pelvis was performed following the standard protocol during bolus administration of intravenous contrast.  Contrast: OMNIPAQUE IOHEXOL 300 MG/ML  SOLN  Comparison: Upper GI 07/10/2012, CT 07/07/2012  Findings: Superior to the gastric body, adjacent to the surgical drain, there is an air fluid collection which is increased in volume compared to prior.  This measures 4.5 x 3.1 cm compared to 2.4 x 1.3 cm on prior.  This is at the site of the prior perforation.  This gas and fluid collection does contains oral contrast.  Difficult to say if this is from the current exam or prior exam.  There is communication with the more superior air fluid collection beneath the left hepatic margin which measures 4.2 x 2.4 cm which is new from prior.  The stomach itself is grossly normal.  There is thickening through the gastric antrum and  the first portion the duodenum.  There is a potential track from the  stomach to the fluid collection seen on sagittal image 74 (series 6).  Oral contrast flows into the small bowel without evidence obstruction.  The colon has  a moderate volume stool.  The rectum is normal.  There is a new left pleural effusion with associated passive atelectasis.  No pericardial fluid.  There is no focal hepatic lesion. There is small amount gas along the falciform ligament related to the gas collection as described above.  The pancreas, spleen, adrenal glands, and kidneys are unchanged.  There are callus calculi within the right kidney.  No  significant free fluid the pelvis.  There is a cyst associated with the right ovary unchanged.  Bladder and uterus are normal. Uterus is absent  IMPRESSION:  1.  Interval increase in volume of fluid collection at the site of prior perforation.  There is an air-fluid level with oral contrast at this level.  Suspicion for continued leak or re perforation. Surgical drain adjacent to the collection.  2.  Extension from the dominant fluid collection into a new collection more superior along the posterior aspect the left hepatic lobe.  These gas collections are seen on the CT scout.  3.  New   left pleural effusion and atelectasis.  Findings conveyed to on-call surgeon Dr.  Johna Sheriff through the OR nurse on 07/13/2012 at 11 :15 am   Original Report Authenticated By: Genevive Bi, M.D.    Dg Chest Port 1 View  08/09/2012  *RADIOLOGY REPORT*  Clinical Data: Pulmonary embolism.  Short of breath.  Cough.  PORTABLE CHEST - 1 VIEW  Comparison: 08/07/2012 radiograph.  08/05/2012 CT.  Findings: Right upper extremity PICC unchanged.  Cardiomegaly is partially obscured by airspace disease at the left base.  The left basilar airspace disease and probable effusion appears little changed compared to prior exam.  Right pleural effusion is present. Suspect underlying emphysema.  Partially visualized cervical ACDF. No pneumothorax.  IMPRESSION: No interval change.  Left basilar collapse / consolidation with probable left pleural effusion.   Original Report Authenticated By: Andreas Newport, M.D.    Dg Chest Port 1 View  08/07/2012  *RADIOLOGY REPORT*  Clinical Data: Follow-up left lower lobe infiltrate.  Pulmonary embolism.  PORTABLE CHEST - 1 VIEW  Comparison: CT 08/05/2012.  Radiographs 08/04/2012.  Findings: 0512 hours.  The right arm PICC is unchanged at the SVC right atrial level.  There is stable cardiomegaly.  There has been some worsening of the left lower lobe air space disease.  There are probable small bilateral pleural  effusions.  No pneumothorax is seen.  IMPRESSION: Slight worsening of left lower lobe air space disease.  This could reflect pneumonia or pulmonary infarction given the prior CT findings.   Original Report Authenticated By: Carey Bullocks, M.D.    Dg Chest Port 1 View  08/04/2012  *RADIOLOGY REPORT*  Clinical Data: Chest pain and cough.  PORTABLE CHEST - 1 VIEW  Comparison: Chest CT 07/13/2012.  Findings: The cardiac silhouette,  mediastinal and hilar contours are stable.  Persistent left lower lobe process appears to be a combination of a pleural effusion and atelectasis.  Minimal right basilar atelectasis also.  Slight increased interstitial markings could be due to bronchitis or interstitial pneumonitis.  The right PICC line tip is in the distal SVC.  The bony thorax is intact.  IMPRESSION: Persistent left lower lobe process. Possible bronchitis or interstitial pneumonitis.   Original Report Authenticated By: [name], M.D.    Dg Kayleen Memos W/water Sol Cm  07/22/2012  *RADIOLOGY REPORT*  Clinical Data:  Status post gastric perforation with recurrent leak.  Increased volume of drainage from surgical drains.  UPPER GI SERIES WITH KUB  Technique:  Focused upper GI was performed with one 120 ml of Omnipaque contrast. Fluoroscopy Time: 6.2 minutes  Comparison:  07/15/2012 and CT of 07/13/2012  Findings: Pre procedure scout film demonstrates bilateral surgical drains.  Nonobstructive bowel gas pattern, without gross free intraperitoneal air.  Postcontrast images demonstrate normal caliber of the stomach. There is irregularity about the gastric antrum.  No gross contrast extravasation is seen.  No well-defined perigastric contrast collection. Suspicion of extraluminal contrast adjacent the gastric antrum and pylorus on series 77.  Most apparent on series 65, is contrast entering the right-sided surgical drain.  This results in increased density within the drain on the final postprocedure radiograph.  IMPRESSION: Subtle  findings which are consistent with contrast leak, presumably from the gastric antrum or pylorus, with small volume contrast draining into the right-sided surgical drain (only readily apparent on image/series 81).  These results will be called to the ordering clinician or representative by the Radiologist Assistant, and communication documented in the PACS Dashboard.   Original Report Authenticated By: Jeronimo Greaves, M.D.    Dg Kayleen Memos W/water Sol Cm  07/15/2012  *RADIOLOGY REPORT*  Clinical Data:  History of recurrent gastric leakage post repair.  UPPER GI SERIES WITH KUB  Technique:100 ml of Omnipaque-300 was injected through the enteric tube filling the stomach with water soluble contrast.  The patient was evaluated in the supine, left lateral, right lateral, and oblique positions.  Fluoroscopy Time: 2.23 minutes  Comparison:  CT 07/14/2002  Findings: : Initial abdominal image shows residual contrast within the colon from prior CT.  Surgical drain is in place entering from the right.  After water soluble contrast was injected into the stomach the patient was examined in multiple positions.  Contrast flowed from the stomach into the duodenum.  Gastroesophageal reflux was seen with contrast in the esophagus.  However, there is no leakage of contrast from the gastrointestinal tract.  No perforation was demonstrated.  IMPRESSION: No gastrointestinal perforation was demonstrated.   Original Report Authenticated By: Onalee Hua Call     MEDICATIONS: Scheduled Meds: . chlorhexidine  15 mL Mouth Rinse BID  . enoxaparin (LOVENOX) injection  1 mg/kg Subcutaneous Q12H  . insulin aspart  0-9 Units Subcutaneous Q8H  . pantoprazole (PROTONIX) IV  40 mg Intravenous Q12H  . sodium chloride  3 mL Intravenous Q12H   Continuous Infusions: . Marland KitchenTPN (CLINIMIX-E) Adult 65 mL/hr at 08/11/12 1720   And  . fat emulsion 250 mL (08/11/12 1720)  . Marland KitchenTPN (CLINIMIX-E) Adult     PRN Meds:.acetaminophen, acetaminophen, albuterol,  diphenhydrAMINE, morphine injection, ondansetron (ZOFRAN) IV, ondansetron, promethazine, sodium chloride  Antibiotics: Anti-infectives   Start     Dose/Rate Route Frequency Ordered Stop   08/05/12 1200  micafungin (MYCAMINE) 100 mg in sodium chloride 0.9 % 100 mL IVPB  Status:  Discontinued     100 mg 100 mL/hr over 1 Hours Intravenous Daily 08/05/12 1113 08/12/12 0750   08/05/12 0500  piperacillin-tazobactam (ZOSYN) IVPB 3.375 g  Status:  Discontinued     3.375 g 12.5 mL/hr over 240 Minutes Intravenous 3 times per day 08/05/12 0408 08/12/12 0750       Jeoffrey Massed, MD  [facility] Pager:[phone]  If 7PM-7AM, please contact night-coverage [email or web address] Password [facility] 08/12/2012, 1:38 PM   LOS: 8 days

## 2012-08-12 NOTE — Care Management Note (Addendum)
    Page 1 of 2   08/13/2012     2:22:16 PM   CARE MANAGEMENT NOTE 08/13/2012  Patient:  Cynthia Bowen, Cynthia Bowen   Account Number:  [id number]  Date Initiated:  08/05/2012  Documentation initiated by:  DAVIS,RHONDA  Subjective/Objective Assessment:   history of recent gastric perforation and repair dcd to home earlier this month, represented with large lllPE     Action/Plan:   iv tpn chroniclly, lives with children tpn managed at home with [facility] and nepew who is trained on the tpn.   Anticipated DC Date:  08/11/2012   Anticipated DC Plan:  HOME W HOME HEALTH SERVICES  In-house referral  Clinical Social Worker      DC Planning Services  CM consult      [facility] Choice  HOME HEALTH   Choice offered to / List presented to:  [name]   DME arranged  NA      DME agency  NA     HH arranged  [facility] INFUSION SERVICES      Status of service:  Completed, signed off Medicare Important Message given?  NA - LOS <3 / Initial given by admissions (If response is "NO", the following Medicare IM given date fields will be blank) Date Medicare IM given:   Date Additional Medicare IM given:    Discharge Disposition:  HOME W HOME HEALTH SERVICES  Per UR Regulation:  Reviewed for med. necessity/level of care/duration of stay  If discussed at Long Length of Stay Meetings, dates discussed:   08/12/2012    Comments:  08-13-12 Lorenda Ishihara RN CM 1354 Genevieve Norlander contacted for [facility] services, initially able to take patient but called back and stated they could not. Contacted [facility], [facility], [facility] for availablility and no one could provide services. Contacted [facility] rep, Tresa Endo and they will arrange for RN, requested safety evaluation from them as well. Got a call back from [facility] and they will do a safety eval and provide resources/recommendation.  08-12-12 Lorenda Ishihara RN CM 1500 Spoke with patient at bedside, states dissatisfied with previous [facility] agency. Unsure about who else to chose but  inquired about LTACH per physician suggestion. Referral to [facility] per request, patient does not qualify for admission to [facility] per Boneta Lucks (rep). Discussed with patient other options for d/c planning, wants to explore SNF as back up plan but prefers to d/c home. I got approval to contact several agencies to see if they were able to support the patient. Contacted [facility] infusions, spoke with Jeannette Corpus, he is optimist about approval. Spoke with patient again and she is encouraged by [facility] and wants to proceed with insurance approval. Provided Tresa Endo clinical information to begin approval process. Will need TNA orders prior to d/c for final approval.  08/12/2012 Angie Fava BSN RN CCM [phone] CM spoke with patient 04/21 regarding d/c planning. States she is from home where her nephrew is caregiver. Pt has declined SNF but will consider LTAC at [facility] if it"s recommended by her surgeon. She plans to talk with him regarding this.   [id number] Earlene Plater, RN, BSN, CCM:  CHART REVIEWED AND UPDATED.  Next chart review due on [id number]. NO DISCHARGE NEEDS PRESENT AT THIS TIME. patient continues to run low grade temp, elevated wbc, and abd pain, bp fluctuantes. CASE MANAGEMENT [phone]   [id number] Earlene Plater, RN, BSN, CCM:  CHART REVIEWED AND UPDATED.  Next chart review due on [id number]. NO DISCHARGE NEEDS PRESENT AT THIS TIME. CASE MANAGEMENT [phone]

## 2012-08-12 NOTE — Progress Notes (Signed)
NUTRITION FOLLOW UP  Intervention:   - TPN per pharmacy with monitoring of LFTs as Alk phos elevated today - Diet advancement per MD - Recommend MD order IVF as appropriate - Recommend nursing re-weigh pt - Will continue to monitor   Nutrition Dx:   Inadequate oral intake related to inability to eat as evidenced by NPO status.   Goal:   Pt to meet >/= 90% of their estimated nutrition needs - met with TPN  Monitor:   Weights, labs, TPN, diet advancement, nausea, BM  Assessment:   Pt with gastric fistula. JP drain output was total yesterday. Pt advanced to clear liquid diet yesterday however developed nausea and epigastric pain after eating. Pt now NPO. Met with pt who c/o nausea, RN aware. Pt c/o being cold after eating ice chips, provided pt with blanket. Pt's recorded weight was 88 lb yesterday, was 90 lb on admission, however unsure if 88 lb is accurate as documented weight was 95 lb (4/20) and 99 lb (4/19). Pt with BM yesterday. Did not see IVF ordered.   TPN: Clinimix E 5/20 @ 65 ml/hr.  Lipids (20% IVFE @ 10 ml/hr), multivitamins, and trace elements are provided 3 times weekly (MWF) due to national backorder.  Provides 1579 kcal and 78 grams protein daily (based on weekly average).  Meets 107% minimum estimated kcal and 120% minimum estimated protein needs.  - Sodium slightly low but stable - Alk phos elevated - PALB improved from 6.9mg /dL on 1/61 to 9.2mg /dL yesterday - CBGs < 150mg /dL  Height: Ht Readings from Last 1 Encounters:  08/05/12 5\' 5"  (1.651 m)    Weight Status:   Wt Readings from Last 1 Encounters:  08/11/12 88 lb 13.5 oz (40.3 kg)    Re-estimated needs:  Kcal: 0960-4540 Protein: 65-77g Fluid: 1.8 L  Skin: Abdominal incision   Diet Order:  NPO   Intake/Output Summary (Last 24 hours) at 08/12/12 1454 Last data filed at 08/12/12 1400  Gross per 24 hour  Intake 2608.08 ml  Output   4210 ml  Net -1601.92 ml    Last BM:  4/21   Labs:   Recent Labs Lab 08/06/12 0345 08/07/12 0410  08/10/12 0500 08/11/12 0430 08/12/12 0500  NA 137 139  < > 131* 133* 132*  K 3.7 3.1*  < > 4.0 4.0 4.1  CL 104 107  < > 96 98 98  CO2 27 26  < > 29 28 26   BUN 23 17  < > 11 13 12   CREATININE 0.69 0.57  < > 0.62 0.66 0.69  CALCIUM 9.1 8.6  < > 9.3 9.5 9.3  MG 2.0 1.8  < > 1.7 1.8 1.9  PHOS 3.6 3.2  --   --  3.6  --   GLUCOSE 126* 119*  < > 106* 116* 99  < > = values in this interval not displayed.  CBG (last 3)   Recent Labs  08/11/12 0640 08/11/12 1802 08/12/12 0616  GLUCAP 119* 110* 120*    Scheduled Meds: . chlorhexidine  15 mL Mouth Rinse BID  . enoxaparin (LOVENOX) injection  1 mg/kg Subcutaneous Q12H  . insulin aspart  0-9 Units Subcutaneous Q8H  . pantoprazole (PROTONIX) IV  40 mg Intravenous Q12H  . sodium chloride  3 mL Intravenous Q12H    Continuous Infusions: . Marland KitchenTPN (CLINIMIX-E) Adult 65 mL/hr at 08/11/12 1720   And  . fat emulsion 250 mL (08/11/12 1720)  . Marland KitchenTPN (CLINIMIX-E) Adult  Cynthia Cadieux Baron MS, RD, LDN [phone] Pager [phone] After Hours Pager  

## 2012-08-12 NOTE — Progress Notes (Signed)
  Subjective: Had some clear liquids yesterday - some epigastric tenderness and nausea More drain output - 110 ml - cloudy, milky fluid + BM yesterday    Objective: Vital signs in last 24 hours: Temp:  [97.9 F (36.6 C)-98.4 F (36.9 C)] 97.9 F (36.6 C) (04/22 1191) Pulse Rate:  [69-83] 83 (04/22 0608) Resp:  [18] 18 (04/22 0608) BP: (106-119)/(63-72) 119/72 mmHg (04/22 0608) SpO2:  [96 %-99 %] 99 % (04/22 0608) Last BM Date: 08/09/12  Intake/Output from previous day: 04/21 0701 - 04/22 0700 In: 3278.1 [P.O.:1657; I.V.:10; IV Piggyback:400; TPN:1211.1] Out: 4211 [Urine:3701; Drains:110; Stool:400] Intake/Output this shift:    General appearance: alert, cooperative and no distress Resp: clear to auscultation bilaterally Cardio: regular rate and rhythm, S1, S2 normal, no murmur, click, rub or gallop GI: soft, non-tender; bowel sounds normal; no masses,  no organomegaly Midline incision healed; R-sided drain with some milky output  Lab Results:   Recent Labs  08/11/12 0430 08/12/12 0500  WBC 10.7* 10.7*  HGB 7.6* 7.5*  HCT 22.3* 23.1*  PLT 631* 680*   BMET  Recent Labs  08/11/12 0430 08/12/12 0500  NA 133* 132*  K 4.0 4.1  CL 98 98  CO2 28 26  GLUCOSE 116* 99  BUN 13 12  CREATININE 0.66 0.69  CALCIUM 9.5 9.3   PT/INR No results found for this basename: LABPROT, INR,  in the last 72 hours ABG No results found for this basename: PHART, PCO2, PO2, HCO3,  in the last 72 hours  Studies/Results: No results found.  Anti-infectives: Anti-infectives   Start     Dose/Rate Route Frequency Ordered Stop   08/05/12 1200  micafungin (MYCAMINE) 100 mg in sodium chloride 0.9 % 100 mL IVPB  Status:  Discontinued     100 mg 100 mL/hr over 1 Hours Intravenous Daily 08/05/12 1113 08/12/12 0750   08/05/12 0500  piperacillin-tazobactam (ZOSYN) IVPB 3.375 g  Status:  Discontinued     3.375 g 12.5 mL/hr over 240 Minutes Intravenous 3 times per day 08/05/12 0408  08/12/12 0750      Assessment/Plan: s/p * No surgery found * Patient seems to be doing well, but still has gastric fistula. Will back off to just ice chips. D/C antibiotics Lovenox therapeutic for PE Will need several more weeks of TNA/ physical therapy/ lovenox  Disposition:  LTAC vs. Home with HHRN/ HHPT  I do not want this patient to go to [facility] under any circumstances.  If [facility] has a bed and the patient is willing, that would be acceptable.  If she insists on going home with HHRN/PT, I do not want [facility] involved.  The patient will think about it and discuss this with her family.    She may potentially be ready for discharge Thursday.   LOS: 8 days    Jeffey Janssen K. 08/12/2012

## 2012-08-12 NOTE — Progress Notes (Signed)
PT was cancelled today due to pt. report that she had already ambulated with OT and would like [place] to come back in the morning.

## 2012-08-12 NOTE — Progress Notes (Signed)
PARENTERAL NUTRITION CONSULT NOTE - Follow-up  Pharmacy Consult for TNA Indication: Recurrent gastric perforation/fistula, prolonged NPO status  Allergies  Allergen Reactions  . Avelox (Moxifloxacin Hcl In Nacl) Nausea And Vomiting  . Alendronate Sodium Nausea And Vomiting  . Aspirin Nausea Only  . Codeine Nausea And Vomiting  . Doxycycline   . Fluconazole   . Hydrocodone Nausea And Vomiting    GI distress  . Neurontin (Gabapentin) Other (See Comments)    Mood changes   . Nsaids Other (See Comments)    Severe gastritis & perforation - avoid NSAIDs when possible  . Sertraline Hcl Other (See Comments)    Hallucinations   . Sulfa Antibiotics Rash    Patient Measurements: Height: 5\' 5"  (165.1 cm) Weight: 88 lb 13.5 oz (40.3 kg) IBW/kg (Calculated) : 57 Usual Weight: ~44kg  Vital Signs: Temp: 97.9 F (36.6 C) (04/22 4782) Temp src: Oral (04/22 0608) BP: 119/72 mmHg (04/22 9562) Pulse Rate: 83 (04/22 0608) Intake/Output from previous day: 04/21 0701 - 04/22 0700 In: 3278.1 [P.O.:1657; I.V.:10; IV Piggyback:400; TPN:1211.1] Out: 4211 [Urine:3701; Drains:110; Stool:400] Intake/Output from this shift:    Labs:  Recent Labs  08/10/12 0500 08/11/12 0430 08/12/12 0500  WBC 12.3* 10.7* 10.7*  HGB 7.8* 7.6* 7.5*  HCT 22.7* 22.3* 23.1*  PLT 623* 631* 680*     Recent Labs  08/10/12 0500 08/11/12 0430 08/12/12 0500  NA 131* 133* 132*  K 4.0 4.0 4.1  CL 96 98 98  CO2 29 28 26   GLUCOSE 106* 116* 99  BUN 11 13 12   CREATININE 0.62 0.66 0.69  CALCIUM 9.3 9.5 9.3  MG 1.7 1.8 1.9  PHOS  --  3.6  --   PROT 6.8 7.1 7.8  ALBUMIN 1.7* 1.9* 1.9*  AST 15 24 30   ALT 7 16 25   ALKPHOS 112 117 136*  BILITOT 0.2* 0.2* 0.2*  PREALBUMIN  --  9.2*  --   TRIG  --  97  --   CHOL  --  81  --    Estimated Creatinine Clearance: 44.6 ml/min (by C-G formula based on Cr of 0.69).    Medications:  Scheduled:  . chlorhexidine  15 mL Mouth Rinse BID  . enoxaparin (LOVENOX)  injection  1 mg/kg Subcutaneous Q12H  . insulin aspart  0-9 Units Subcutaneous Q12H  . pantoprazole (PROTONIX) IV  40 mg Intravenous Q12H  . sodium chloride  3 mL Intravenous Q12H  . [DISCONTINUED] micafungin (MYCAMINE) IV  100 mg Intravenous Daily  . [DISCONTINUED] piperacillin-tazobactam (ZOSYN)  IV  3.375 g Intravenous Q8H   Infusions:  . [EXPIRED] .TPN (CLINIMIX-E) Adult 65 mL/hr at 08/10/12 1722  . Marland KitchenTPN (CLINIMIX-E) Adult 65 mL/hr at 08/11/12 1720   And  . fat emulsion 250 mL (08/11/12 1720)    Insulin Requirements in the past 24 hours:  0 units, on sensitive SSI q 12 hours  Current Nutrition:  Clinimix E5/20 at 65 ml/hr with lipids MWF at 10 ml/hr (as per previous admission).  Note PTA Home TPN was being cycle over 18 hours: Dex 312gm, protein 78g, lipids 50mg  Tu/th, over 18h infusion  IVF: NS flushes  Nutritional Goals:  RD recs (4/15): Kcal: 1308-6578, Protein: 65-77 grams, Fluid: 1.8 L  Clinimix E5/20 @ 65 ml/hr (provides avg 1579 Kcal/day, 78 g protein/day).  Assessment: 73 y/oF with several recent hospitalizations for epigastric hernia repair and gastric biopsy, complicated by gastric perforation with abscess/periotinitis s/p omental patch repair. Pt was started on TPN 3/24  and d/c'd on home TPN 07/26/12.  Pt readmitted with BL PEs and CT suggestive of possible recurrent leak. Surgery consulted and was considering percutaneous drainage but plans now are for bowel rest and TNA.  TNA resumed 4/15 and adjusted to continuous 24 hour infusion while inpatient.    Glucose: CBGs stable at goal, have been controlled so will reduce CBG checks to q12h Electrolytes:  Stable except Na slightly low but stable - will be unable to adjust in premixed TNA, corr Ca 11 Renal:  SCr stable LFTs: AST/ALT WNL, albumin low TGs:  WNL 4/15 and 4/21 Prealbumin: 6.9 on 4/15, 8.1 (3/31), <3 (3/25), 9.2 (4/21)  4/22: increasing drain output. Epigastric tenderness after having some clears  yesterday- diet changed to ice chips, +BM yesterday, still with gastric fistula and will require several more weeks of TNA/PT/Lovenox  Plan:   With plan for patient to potentially be discharged on Thursday will transition back to patient's previous home 18hr cyclic regimen beginning today as patient's lytes and CBGs are stable as well as patient tolerating continuous TNA regimen.  Using E 5/20 Clinimix formula, use the following TNA rates to provide 78g protein and 1560 ml/18hrs:  6p-7p: 50 ml/hr, 7p-11a: 90 ml/hr, 11a-noon: 50 ml/hr, noon-6p: OFF  Fat emulsion MWF only due to ongoing shortage  MVI/TE MWF d/t national backorder  TNA lab panels on Mondays & Thursdays.  CMP and Mg ordered daily by physician.   Hessie Knows, PharmD, BCPS Pager [phone] 08/12/2012 8:18 AM

## 2012-08-13 LAB — CBC
HCT: 23.5 % — ABNORMAL LOW (ref 36.0–46.0)
Platelets: 763 10*3/uL — ABNORMAL HIGH (ref 150–400)
RBC: 2.92 MIL/uL — ABNORMAL LOW (ref 3.87–5.11)
RDW: 16.3 % — ABNORMAL HIGH (ref 11.5–15.5)
WBC: 9.9 10*3/uL (ref 4.0–10.5)

## 2012-08-13 LAB — COMPREHENSIVE METABOLIC PANEL
AST: 22 U/L (ref 0–37)
Albumin: 2.2 g/dL — ABNORMAL LOW (ref 3.5–5.2)
Alkaline Phosphatase: 127 U/L — ABNORMAL HIGH (ref 39–117)
CO2: 27 mEq/L (ref 19–32)
Chloride: 98 mEq/L (ref 96–112)
Potassium: 4 mEq/L (ref 3.5–5.1)
Total Bilirubin: 0.2 mg/dL — ABNORMAL LOW (ref 0.3–1.2)

## 2012-08-13 LAB — GLUCOSE, CAPILLARY: Glucose-Capillary: 117 mg/dL — ABNORMAL HIGH (ref 70–99)

## 2012-08-13 MED ORDER — TRACE MINERALS CR-CU-F-FE-I-MN-MO-SE-ZN IV SOLN
INTRAVENOUS | Status: DC
  Administered 2012-08-13: 18:00:00 via INTRAVENOUS
  Filled 2012-08-13: qty 2000

## 2012-08-13 MED ORDER — FAT EMULSION 20 % IV EMUL
250.0000 mL | INTRAVENOUS | Status: DC
  Administered 2012-08-13: 250 mL via INTRAVENOUS
  Filled 2012-08-13 (×2): qty 250

## 2012-08-13 MED ORDER — HYDROCODONE-ACETAMINOPHEN 7.5-325 MG/15ML PO SOLN
10.0000 mL | ORAL | Status: DC | PRN
  Administered 2012-08-14: 10 mL via ORAL
  Filled 2012-08-13: qty 15

## 2012-08-13 MED ORDER — PROMETHAZINE HCL 6.25 MG/5ML PO SYRP: 12.5000 mg | ORAL_SOLUTION | Freq: Four times a day (QID) | ORAL | Status: DC | PRN

## 2012-08-13 NOTE — Progress Notes (Addendum)
  Subjective: Patient wants to go home instead of LTAC. Less output from drain + BM Still with some nausea  Objective: Vital signs in last 24 hours: Temp:  [98.5 F (36.9 C)-99.9 F (37.7 C)] 98.5 F (36.9 C) (04/23 0615) Pulse Rate:  [84-86] 84 (04/23 0615) Resp:  [18] 18 (04/23 0615) BP: (108-110)/(67-70) 110/67 mmHg (04/23 0615) SpO2:  [98 %] 98 % (04/23 0615) Weight:  [89 lb 8.1 oz (40.6 kg)] 89 lb 8.1 oz (40.6 kg) (04/23 0500) Last BM Date: 08/12/12  Intake/Output from previous day: 04/22 0701 - 04/23 0700 In: 2001.1 [TPN:2001.1] Out: 3451 [Urine:3200; Drains:50; Stool:201] Intake/Output this shift:    General appearance: alert, cooperative and no distress Resp: clear to auscultation bilaterally GI: soft, tender only around drain; +BS Incision healed  Lab Results:   Recent Labs  08/12/12 0500 08/13/12 0445  WBC 10.7* 9.9  HGB 7.5* 7.9*  HCT 23.1* 23.5*  PLT 680* 763*   BMET  Recent Labs  08/12/12 0500 08/13/12 0445  NA 132* 133*  K 4.1 4.0  CL 98 98  CO2 26 27  GLUCOSE 99 107*  BUN 12 16  CREATININE 0.69 0.59  CALCIUM 9.3 9.7   PT/INR No results found for this basename: LABPROT, INR,  in the last 72 hours ABG No results found for this basename: PHART, PCO2, PO2, HCO3,  in the last 72 hours  Studies/Results: No results found.  Anti-infectives: Anti-infectives   Start     Dose/Rate Route Frequency Ordered Stop   08/05/12 1200  micafungin (MYCAMINE) 100 mg in sodium chloride 0.9 % 100 mL IVPB  Status:  Discontinued     100 mg 100 mL/hr over 1 Hours Intravenous Daily 08/05/12 1113 08/12/12 0750   08/05/12 0500  piperacillin-tazobactam (ZOSYN) IVPB 3.375 g  Status:  Discontinued     3.375 g 12.5 mL/hr over 240 Minutes Intravenous 3 times per day 08/05/12 0408 08/12/12 0750      Assessment/Plan: s/p * No surgery found * Chronic gastric fistula Bilateral pulmonary emboli - stable on Lovenox therapeutic  Plan:  Probable discharge  tomorrow with home health infusion therapy for TNA Home health PT for strengthening per inpatient PT recommendations Will remain on Lovenox 45 mg [place] BID at home - once gastric fistula healed and patient is eating, then she will switch to oral anticoagulation  We do not want [facility] involved in her home health care.  Lortab elixir and Phenergan elixir PRN  Off all antibiotics - afebrile/ nl WBC  She needs bowel rest for 30-90 days to allow the fistula to heal.  Enteral nutrition is inappropriate at this time due to current condition and location of gastric fistula  LOS: 9 days    Cynthia Bowen K. 08/13/2012

## 2012-08-13 NOTE — Progress Notes (Signed)
PARENTERAL NUTRITION CONSULT NOTE - Follow-up  Pharmacy Consult for TNA Indication: Recurrent gastric perforation/fistula, prolonged NPO status  Allergies  Allergen Reactions  . Avelox (Moxifloxacin Hcl In Nacl) Nausea And Vomiting  . Alendronate Sodium Nausea And Vomiting  . Aspirin Nausea Only  . Codeine Nausea And Vomiting  . Doxycycline   . Fluconazole   . Hydrocodone Nausea And Vomiting    GI distress  . Neurontin (Gabapentin) Other (See Comments)    Mood changes   . Nsaids Other (See Comments)    Severe gastritis & perforation - avoid NSAIDs when possible  . Sertraline Hcl Other (See Comments)    Hallucinations   . Sulfa Antibiotics Rash    Patient Measurements: Height: 5\' 5"  (165.1 cm) Weight: 89 lb 8.1 oz (40.6 kg) IBW/kg (Calculated) : 57 Usual Weight: ~44kg  Vital Signs: Temp: 98.5 F (36.9 C) (04/23 0615) Temp src: Oral (04/23 0615) BP: 110/67 mmHg (04/23 0615) Pulse Rate: 84 (04/23 0615) Intake/Output from previous day: 04/22 0701 - 04/23 0700 In: 2001.1 [TPN:2001.1] Out: 3451 [Urine:3200; Drains:50; Stool:201] Intake/Output from this shift:    Labs:  Recent Labs  08/11/12 0430 08/12/12 0500 08/13/12 0445  WBC 10.7* 10.7* 9.9  HGB 7.6* 7.5* 7.9*  HCT 22.3* 23.1* 23.5*  PLT 631* 680* 763*     Recent Labs  08/11/12 0430 08/12/12 0500 08/13/12 0445  NA 133* 132* 133*  K 4.0 4.1 4.0  CL 98 98 98  CO2 28 26 27   GLUCOSE 116* 99 107*  BUN 13 12 16   CREATININE 0.66 0.69 0.59  CALCIUM 9.5 9.3 9.7  MG 1.8 1.9 1.9  PHOS 3.6  --   --   PROT 7.1 7.8 7.9  ALBUMIN 1.9* 1.9* 2.2*  AST 24 30 22   ALT 16 25 21   ALKPHOS 117 136* 127*  BILITOT 0.2* 0.2* 0.2*  PREALBUMIN 9.2*  --   --   TRIG 97  --   --   CHOL 81  --   --    Estimated Creatinine Clearance: 44.9 ml/min (by C-G formula based on Cr of 0.59).    Medications:  Scheduled:  . chlorhexidine  15 mL Mouth Rinse BID  . enoxaparin (LOVENOX) injection  1 mg/kg Subcutaneous Q12H  .  insulin aspart  0-9 Units Subcutaneous Q8H  . pantoprazole (PROTONIX) IV  40 mg Intravenous Q12H  . sodium chloride  3 mL Intravenous Q12H  . [DISCONTINUED] insulin aspart  0-9 Units Subcutaneous Q12H   Infusions:  . [EXPIRED] .TPN (CLINIMIX-E) Adult 65 mL/hr at 08/11/12 1720   And  . [EXPIRED] fat emulsion 250 mL (08/11/12 1720)  . Marland KitchenTPN (CLINIMIX-E) Adult 50 mL/hr at 08/12/12 1751    Insulin Requirements in the past 24 hours:  0 units, on sensitive SSI q 8 hours  Current Nutrition:  Clinimix E5/20 at 65 ml/hr with lipids MWF at 10 ml/hr (as per previous admission).  Note PTA Home TPN was being cycle over 18 hours: Dex 312gm, protein 78g, lipids 50mg  Tu/th, over 18h infusion  IVF: NS flushes  Nutritional Goals:  RD recs (4/15): Kcal: 8469-6295, Protein: 65-77 grams, Fluid: 1.8 L  Clinimix E5/20 @ cyclic rate over 18hrs (provides avg 1579 Kcal/day, 78 g protein/day).  Assessment: 42 y/oF with several recent hospitalizations for epigastric hernia repair and gastric biopsy, complicated by gastric perforation with abscess/periotinitis s/p omental patch repair. Pt was started on TPN 3/24 and d/c'd on home TPN 07/26/12.  Pt readmitted with BL PEs and CT  suggestive of possible recurrent leak. Surgery consulted and was considering percutaneous drainage but plans now are for bowel rest and TNA.  TNA resumed 4/15 and adjusted to continuous 24 hour infusion while inpatient.    Glucose: CBGs stable at goal Electrolytes:  Stable except Na slightly low but stable - will be unable to adjust in premixed TNA, corr Ca 11.1 Renal:  SCr stable LFTs: AST/ALT WNL, albumin low, alk phos slightly high TGs:  WNL 4/15 and 4/21 Prealbumin: 6.9 on 4/15, 8.1 (3/31), <3 (3/25), 9.2 (4/21)  4/23: drain output reduced last 24hrs. Epigastric tenderness after having some clears - diet changed to ice chips/NPO, per [facility] notes still with gastric fistula and will require several more weeks of  TNA/PT/Lovenox  Plan:   Transitioned back to patient's previous home 18hr cyclic regimen beginning yesterday 4/22.  Continue E 5/20 Clinimix formula, use the following TNA rates to provide 78g protein and 1560 ml/18hrs:  6p-7p: 50 ml/hr, 7p-11a: 90 ml/hr, 11a-noon: 50 ml/hr, noon-6p: OFF  Fat emulsion MWF only due to ongoing shortage over 18hr cyclic just like Clinimix  MVI/TE MWF d/t national backorder  TNA lab panels on Mondays & Thursdays.  CMP and Mg ordered daily by physician.   Hessie Knows, PharmD, BCPS Pager [phone] 08/13/2012 8:24 AM

## 2012-08-13 NOTE — Progress Notes (Signed)
Per [facility] infusions, plan to see patient tomorrow around 4pm at her home for initiation of TNA and teaching . Patient aware, awaiting final TNA orders to be faxed to [facility] at [phone].

## 2012-08-13 NOTE — Progress Notes (Signed)
Physical Therapy Treatment Patient Details Name: SHANNAN SLINKER MRN: [id number] DOB: [birth year] Today's Date: 08/13/2012 Time: 4098-1191 PT Time Calculation (min): 8 min  PT Assessment / Plan / Recommendation Comments on Treatment Session  Pt doing very well with mobility.  Pt ambulated in hallway pushing IV pole and then had visiting arrive while ambulating so did not wish to practice stairs while up.  Pt reports pain is tolerable with ambulation and declined notifying RN to check on pain meds.  Pt reports she feels ready to d/c home tomorrow.    Follow Up Recommendations  Home health PT     Does the patient have the potential to tolerate intense rehabilitation     Barriers to Discharge        Equipment Recommendations  Rolling walker with 5" wheels    Recommendations for Other Services    Frequency     Plan Discharge plan remains appropriate;Frequency remains appropriate    Precautions / Restrictions Precautions Precautions: Fall   Pertinent Vitals/Pain n/a    Mobility  Bed Mobility Bed Mobility: Not assessed Details for Bed Mobility Assistance: pt sitting in recliner upon arrival Transfers Transfers: Sit to Stand;Stand to Sit Sit to Stand: 5: Supervision;From chair/3-in-1;With upper extremity assist Stand to Sit: 5: Supervision;With upper extremity assist;To chair/3-in-1 Details for Transfer Assistance: off and on recliner with legrest up Ambulation/Gait Ambulation/Gait Assistance: 5: Supervision Ambulation Distance (Feet): 500 Feet Ambulation/Gait Assistance Details: pt pushed IV pole for support, declined RW, also declined therapist pushing IV pole, better posture today and pt cued herself for trunk extension Gait Pattern: Step-through pattern;Decreased stride length    Exercises     PT Diagnosis:    PT Problem List:   PT Treatment Interventions:     PT Goals Acute Rehab PT Goals PT Goal: Sit to Stand - Progress: Progressing toward goal PT Goal: Ambulate -  Progress: Progressing toward goal  Visit Information  Last PT Received On: 08/13/12 Assistance Needed: +1    Subjective Data  Subjective: I wish you all had come this morning.  I've been wanting to walk all day.   Cognition  Cognition Arousal/Alertness: Awake/alert Behavior During Therapy: WFL for tasks assessed/performed Overall Cognitive Status: Within Functional Limits for tasks assessed    Balance     End of Session PT - End of Session Activity Tolerance: Patient tolerated treatment well Patient left: with call bell/phone within reach;in chair;with family/visitor present   GP     Kameka Whan,KATHrine E 08/13/2012, 3:38 PM Zenovia Jarred, PT, DPT 08/13/2012 Pager: [phone]

## 2012-08-13 NOTE — Progress Notes (Signed)
TNA Formula: A copy of the patient's current TNA formula with lipids has been placed in patient's chart for availability to fax by care management. Note that Lipids are only used on MWF as inpatient. Also note that patient only receives MVI and trace elements in TNA on MWF while hospitalized.    Hessie Knows, PharmD, BCPS Pager [phone] 08/13/2012 3:14 PM

## 2012-08-14 ENCOUNTER — Other Ambulatory Visit (INDEPENDENT_AMBULATORY_CARE_PROVIDER_SITE_OTHER): Payer: Self-pay | Admitting: Surgery

## 2012-08-14 DIAGNOSIS — R110 Nausea: Secondary | ICD-10-CM

## 2012-08-14 DIAGNOSIS — R109 Unspecified abdominal pain: Secondary | ICD-10-CM

## 2012-08-14 LAB — GLUCOSE, CAPILLARY

## 2012-08-14 LAB — CBC
HCT: 24.2 % — ABNORMAL LOW (ref 36.0–46.0)
Hemoglobin: 8.1 g/dL — ABNORMAL LOW (ref 12.0–15.0)
MCH: 27.3 pg (ref 26.0–34.0)
MCHC: 33.5 g/dL (ref 30.0–36.0)
MCV: 81.5 fL (ref 78.0–100.0)

## 2012-08-14 LAB — MAGNESIUM: Magnesium: 1.8 mg/dL (ref 1.5–2.5)

## 2012-08-14 LAB — COMPREHENSIVE METABOLIC PANEL
Albumin: 2.1 g/dL — ABNORMAL LOW (ref 3.5–5.2)
Alkaline Phosphatase: 119 U/L — ABNORMAL HIGH (ref 39–117)
BUN: 20 mg/dL (ref 6–23)
Chloride: 100 mEq/L (ref 96–112)
Potassium: 4.1 mEq/L (ref 3.5–5.1)
Total Bilirubin: 0.2 mg/dL — ABNORMAL LOW (ref 0.3–1.2)

## 2012-08-14 MED ORDER — PROMETHAZINE HCL 6.25 MG/5ML PO SYRP: 12.5000 mg | ORAL_SOLUTION | Freq: Four times a day (QID) | ORAL | Status: DC | PRN

## 2012-08-14 MED ORDER — ENOXAPARIN SODIUM 60 MG/0.6ML ~~LOC~~ SOLN: 45.0000 mg | Freq: Two times a day (BID) | SUBCUTANEOUS | Status: DC

## 2012-08-14 MED ORDER — HEPARIN SOD (PORK) LOCK FLUSH 100 UNIT/ML IV SOLN
250.0000 [IU] | INTRAVENOUS | Status: DC | PRN
  Administered 2012-08-14: 250 [IU]

## 2012-08-14 MED ORDER — HYDROCODONE-ACETAMINOPHEN 7.5-325 MG/15ML PO SOLN: 10.0000 mL | ORAL | Status: DC | PRN

## 2012-08-14 NOTE — Discharge Summary (Signed)
Physician Discharge Summary  Patient ID: Cynthia Bowen MRN: [id number] DOB/AGE: [birth year] 67 y.o.  Admit date: 08/04/2012 Discharge date: 08/14/2012  Admission Diagnoses:  Gastric fistula    Bilateral pulmonary embolism    Protein calorie malnutrition  Discharge Diagnoses:  Principal Problem:   PE (pulmonary embolism) Active Problems:   COPD (chronic obstructive pulmonary disease)   Protein-calorie malnutrition, severe   Abdominal pain   Hemoptysis   Discharged Condition: good  Hospital Course: Patient with known gastric fistula - home on TNA/ NPO status with drain in place - developed chest pain and shortness of breath.  Came to ED and was diagnosed with bilateral pulmonary embolism.  Started on anti-coagulation with improvement in her symptoms.  She had minimal drain output so clear liquids were initiated.  However, her drain output increased, which indicated that the fistula had not healed completely.  She is still on NPO status with TNA and drain.  Consults: [facility]/ PT/ OT/ Pharmacy  Significant Diagnostic Studies: CT chest/ abd/ pelvis  Treatments: IV hydration and anticoagulation: LMW heparin  Discharge Exam: Blood pressure 104/66, pulse 90, temperature 98.9 F (37.2 C), temperature source Oral, resp. rate 18, height 5\' 5"  (1.651 m), weight 87 lb 11.9 oz (39.8 kg), SpO2 100.00%. General appearance: alert, cooperative and no distress Resp: clear to auscultation bilaterally Cardio: regular rate and rhythm, S1, S2 normal, no murmur, click, rub or gallop GI: soft, minimal tenderness near drain Midline incision healed; drain with some thin serous output  Disposition: [facility] - TNA at home Patient will continue on SQ Lovenox 45 mg BID until taking PO's Continue drain care - empty and record output Ambulate with assistance   Discharge Orders   Future Orders Complete By Expires     Call MD for:  persistant nausea and  vomiting  As directed     Call MD for:  redness, tenderness, or signs of infection (pain, swelling, redness, odor or green/yellow discharge around incision site)  As directed     Call MD for:  severe uncontrolled pain  As directed     Call MD for:  temperature >100.4  As directed     Diet general  As directed     Discharge wound care:  As directed     Comments:      JP drain to bulb suction - empty and record output daily No dressing needed over midline incision    Driving Restrictions  As directed     Comments:      Do not drive while taking pain medications    Increase activity slowly  As directed     May shower / Bathe  As directed     May walk up steps  As directed         Medication List    STOP taking these medications       dextromethorphan-guaiFENesin 30-600 MG per 12 hr tablet  Commonly known as:  MUCINEX DM      TAKE these medications       albuterol 108 (90 BASE) MCG/ACT inhaler  Commonly known as:  PROVENTIL HFA;VENTOLIN HFA  Inhale 2 puffs into the lungs every 4 (four) hours as needed for wheezing (cough, shortness of breath or wheezing.).     diphenhydrAMINE 25 mg capsule  Commonly known as:  BENADRYL  Take 25-50 mg by mouth every 6 (six) hours as needed. Itching     enoxaparin 60 MG/0.6ML injection  Commonly known as:  LOVENOX  Inject 0.45 mLs (45 mg total) into the skin every 12 (twelve) hours.     EPINEPHrine 0.3 mg/0.3 mL Devi  Commonly known as:  EPIPEN  Inject 0.3 mLs (0.3 mg total) into the muscle once.     fentaNYL 50 MCG/HR  Commonly known as:  DURAGESIC - dosed mcg/hr  Place 5 patches (250 mcg total) onto the skin every 3 (three) days.     FLUoxetine 20 MG capsule  Commonly known as:  PROZAC  Take 20 mg by mouth daily before breakfast.     HYDROcodone-acetaminophen 7.5-325 mg/15 ml solution  Commonly known as:  HYCET  Take 10 mLs by mouth every 4 (four) hours as needed.     HYDROcodone-acetaminophen 7.5-325 mg/15 ml solution  Commonly  known as:  HYCET  Take 10 mLs by mouth every 4 (four) hours as needed.     multivitamin tablet  Take 1 tablet by mouth daily.     ondansetron 8 MG disintegrating tablet  Commonly known as:  ZOFRAN-ODT  Take 1 tablet (8 mg total) by mouth every 8 (eight) hours as needed. For nausea     pantoprazole 40 MG tablet  Commonly known as:  PROTONIX  Take 40 mg by mouth daily.     promethazine 12.5 MG tablet  Commonly known as:  PHENERGAN  Take 1 tablet (12.5 mg total) by mouth every 6 (six) hours as needed for nausea.     promethazine 6.25 MG/5ML syrup  Commonly known as:  PHENERGAN  Take 10 mLs (12.5 mg total) by mouth every 6 (six) hours as needed.     ranitidine 150 MG tablet  Commonly known as:  ZANTAC  Take 150 mg by mouth 2 (two) times daily.     triamcinolone cream 0.1 %  Commonly known as:  KENALOG  Apply 1 application topically as needed (as needed for right ear pain).       Follow-up in two weeks with Dr. [name].  His office will call you to schedule.    Signed: Verne Lanuza K. 08/14/2012, 8:23 AM

## 2012-08-14 NOTE — Care Management (Signed)
CM provided [facility] rep Jeannette Corpus with HH orders & TPN orders for home infusion therapy. All services arranged per rep whom faxed documents to pharmacy with confirmation received.    Roxy Manns Tyrina Hines,RN,BSN [phone]

## 2012-08-15 ENCOUNTER — Telehealth (INDEPENDENT_AMBULATORY_CARE_PROVIDER_SITE_OTHER): Payer: Self-pay | Admitting: General Surgery

## 2012-08-15 ENCOUNTER — Encounter (INDEPENDENT_AMBULATORY_CARE_PROVIDER_SITE_OTHER): Payer: Self-pay | Admitting: Surgery

## 2012-08-15 NOTE — Telephone Encounter (Signed)
Dr [name] called in Lovenox into the [facility] on [address]..[phone]

## 2012-08-16 ENCOUNTER — Encounter (HOSPITAL_COMMUNITY): Payer: Self-pay | Admitting: Nurse Practitioner

## 2012-08-16 ENCOUNTER — Emergency Department (HOSPITAL_COMMUNITY)
Admission: EM | Admit: 2012-08-16 | Discharge: 2012-08-16 | Disposition: A | Discharge status: Home / Self Care | Payer: Medicare Other | Attending: Emergency Medicine | Admitting: Emergency Medicine

## 2012-08-16 DIAGNOSIS — F329 Major depressive disorder, single episode, unspecified: Secondary | ICD-10-CM | POA: Insufficient documentation

## 2012-08-16 DIAGNOSIS — Z8669 Personal history of other diseases of the nervous system and sense organs: Secondary | ICD-10-CM | POA: Insufficient documentation

## 2012-08-16 DIAGNOSIS — J449 Chronic obstructive pulmonary disease, unspecified: Secondary | ICD-10-CM | POA: Insufficient documentation

## 2012-08-16 DIAGNOSIS — Z8701 Personal history of pneumonia (recurrent): Secondary | ICD-10-CM | POA: Insufficient documentation

## 2012-08-16 DIAGNOSIS — L509 Urticaria, unspecified: Secondary | ICD-10-CM | POA: Insufficient documentation

## 2012-08-16 DIAGNOSIS — Z87891 Personal history of nicotine dependence: Secondary | ICD-10-CM | POA: Insufficient documentation

## 2012-08-16 DIAGNOSIS — K316 Fistula of stomach and duodenum: Secondary | ICD-10-CM | POA: Insufficient documentation

## 2012-08-16 DIAGNOSIS — J4489 Other specified chronic obstructive pulmonary disease: Secondary | ICD-10-CM | POA: Insufficient documentation

## 2012-08-16 DIAGNOSIS — Z8739 Personal history of other diseases of the musculoskeletal system and connective tissue: Secondary | ICD-10-CM | POA: Insufficient documentation

## 2012-08-16 DIAGNOSIS — K219 Gastro-esophageal reflux disease without esophagitis: Secondary | ICD-10-CM | POA: Insufficient documentation

## 2012-08-16 DIAGNOSIS — Z79899 Other long term (current) drug therapy: Secondary | ICD-10-CM | POA: Insufficient documentation

## 2012-08-16 DIAGNOSIS — F3289 Other specified depressive episodes: Secondary | ICD-10-CM | POA: Insufficient documentation

## 2012-08-16 MED ORDER — DIPHENHYDRAMINE HCL 50 MG/ML IJ SOLN
25.0000 mg | Freq: Once | INTRAMUSCULAR | Status: DC
  Filled 2012-08-16: qty 1

## 2012-08-16 MED ORDER — METHYLPREDNISOLONE SODIUM SUCC 125 MG IJ SOLR
60.0000 mg | Freq: Once | INTRAMUSCULAR | Status: AC
  Administered 2012-08-16: 60 mg via INTRAMUSCULAR
  Filled 2012-08-16: qty 2

## 2012-08-16 MED ORDER — METHYLPREDNISOLONE SODIUM SUCC 125 MG IJ SOLR
60.0000 mg | Freq: Once | INTRAMUSCULAR | Status: DC
  Filled 2012-08-16: qty 2

## 2012-08-16 MED ORDER — DIPHENHYDRAMINE HCL 50 MG/ML IJ SOLN
25.0000 mg | Freq: Once | INTRAMUSCULAR | Status: AC
  Administered 2012-08-16: 25 mg via INTRAMUSCULAR
  Filled 2012-08-16: qty 1

## 2012-08-16 NOTE — ED Provider Notes (Signed)
History     CSN: [id number]  Arrival date & time 08/16/12  4098   First MD Initiated Contact with Patient 08/16/12 [phone]      Chief Complaint  Patient presents with  . Rash     HPI The patient reports that she's currently on TPN over the past 12 days for a gastric fistula in the she's not to eating or drinking anything.  She developed hives over the past 12-24 hours of her chest her back and right upper extremities.  She's had hives before this is a new problem for her.  She seems to otherwise be tolerating the TPN well.  No difficulty breathing or swallowing.  No lightheadedness or syncope.  She was unsure what to do since she can't eat or drink anything.  No complaints of fevers or chills.  No problems with her PICC line her pain with continuous TPN through her PICC line.  No unilateral arm swelling.    Past Medical History  Diagnosis Date  . COPD (chronic obstructive pulmonary disease)   . Pneumonia 12-2011  . GERD (gastroesophageal reflux disease)   . Headache   . Arthritis     osteoarthritis  . Allergy   . Depression   . Neuromuscular disorder   . Osteoporosis   . Bronchitis     CURRENTLY AS OF 06/30/12 - HAS COUGH AND FINISHED ANTIBIOTIC FOR BRONCHITIS  . Fibromyalgia   . Pain     ABDOMINAL PAIN AND NAUSEA  . Pain     SOMETIMES PAIN RIGHT EAR AND NECK--STATES CAUSED BY A "LUMP" ON BACK OF EAR--USES KENALOG CREAM TOPICALLY AS NEEDED.    Past Surgical History  Procedure Laterality Date  . Abdominal hysterectomy    . Esophagogastroduodenoscopy  04/18/2012    Procedure: ESOPHAGOGASTRODUODENOSCOPY (EGD);  Surgeon: [name], MD;  Location: [facility];  Service: Endoscopy;  Laterality: N/A;  . Eus  05/29/2012    Procedure: UPPER ENDOSCOPIC ULTRASOUND (EUS) LINEAR;  Surgeon: [name], MD;  Location: [facility];  Service: Endoscopy;  Laterality: N/A;  . Appendectomy    . Spine surgery      CERVICAL SPINE SURGERY X 2 - INCLUDING FUSION; LUMBAR SURGERY FOR  RUPTURED DISC  . Eye surgery      BILATERAL CATARACT EXTRACTIONS  . Laparoscopic abdominal exploration N/A 07/02/2012    Procedure: converted to laparotomy with gastric biopsy;  Surgeon: [name]. [name], MD;  Location: [facility];  Service: General;  Laterality: N/A;  Laparoscopic Gastric Biospy attempted.   . Laparotomy N/A 07/07/2012    Procedure: EXPLORATORY LAPAROTOMY repair of gastric perforation with omental graham patch, drainage of abdominal abcess;  Surgeon: [name]. [name], MD;  Location: [facility];  Service: General;  Laterality: N/A;  . Stomach surgery  07/07/2012    Omental patch of gastric perforation  . Laparotomy N/A 07/13/2012    Procedure: EXPLORATORY LAPAROTOMY repair gastric leak;  Surgeon: Mariella Saa, MD;  Location: [facility];  Service: General;  Laterality: N/A;    Family History  Problem Relation Age of Onset  . COPD Mother   . Hypertension Maternal Grandmother     History  Substance Use Topics  . Smoking status: Former Smoker -- 0.50 packs/day for 35 years    Types: Cigarettes    Start date: 01/04/2003    Quit date: 04/17/2005  . Smokeless tobacco: Never Used  . Alcohol Use: No    OB History   Grav Para Term Preterm Abortions TAB SAB Ect  Mult Living                  Review of Systems  All other systems reviewed and are negative.    Allergies  Avelox; Alendronate sodium; Aspirin; Codeine; Doxycycline; Fluconazole; Hydrocodone; Neurontin; Nsaids; Sertraline hcl; and Sulfa antibiotics  Home Medications   Current Outpatient Rx  Name  Route  Sig  Dispense  Refill  . enoxaparin (LOVENOX) 60 MG/0.6ML injection   Subcutaneous   Inject 0.45 mLs (45 mg total) into the skin every 12 (twelve) hours.   60 Syringe   2   . EPINEPHrine (EPIPEN) 0.3 mg/0.3 mL DEVI   Intramuscular   Inject 0.3 mLs (0.3 mg total) into the muscle once.   1 Device   2   . HYDROcodone-acetaminophen (HYCET) 7.5-325 mg/15 ml solution   Oral   Take 10 mLs by mouth every 4  (four) hours as needed.   120 mL   2   . promethazine (PHENERGAN) 6.25 MG/5ML syrup   Oral   Take 10 mLs (12.5 mg total) by mouth every 6 (six) hours as needed.   120 mL   2   . albuterol (PROVENTIL HFA;VENTOLIN HFA) 108 (90 BASE) MCG/ACT inhaler   Inhalation   Inhale 2 puffs into the lungs every 4 (four) hours as needed for wheezing (cough, shortness of breath or wheezing.).   1 Inhaler   1   . diphenhydrAMINE (BENADRYL) 25 mg capsule   Oral   Take 25-50 mg by mouth every 6 (six) hours as needed. Itching         . fentaNYL (DURAGESIC - DOSED MCG/HR) 50 MCG/HR   Transdermal   Place 5 patches (250 mcg total) onto the skin every 3 (three) days.   5 patch   0   . FLUoxetine (PROZAC) 20 MG capsule   Oral   Take 20 mg by mouth daily before breakfast.         . Multiple Vitamin (MULTIVITAMIN) tablet   Oral   Take 1 tablet by mouth daily.         . ondansetron (ZOFRAN-ODT) 8 MG disintegrating tablet   Oral   Take 1 tablet (8 mg total) by mouth every 8 (eight) hours as needed. For nausea   20 tablet   2   . pantoprazole (PROTONIX) 40 MG tablet   Oral   Take 40 mg by mouth daily.         . promethazine (PHENERGAN) 12.5 MG tablet   Oral   Take 1 tablet (12.5 mg total) by mouth every 6 (six) hours as needed for nausea.   30 tablet   0   . ranitidine (ZANTAC) 150 MG tablet   Oral   Take 150 mg by mouth 2 (two) times daily.         Marland Kitchen triamcinolone cream (KENALOG) 0.1 %   Topical   Apply 1 application topically as needed (as needed for right ear pain).           BP 128/67  Pulse 84  Temp(Src) 98 F (36.7 C)  Resp 20  Ht 5\' 5"  (1.651 m)  Wt 94 lb (42.638 kg)  BMI 15.64 kg/m2  SpO2 100%  Physical Exam  Nursing note and vitals reviewed. Constitutional: She is oriented to person, place, and time. She appears well-developed and well-nourished. No distress.  HENT:  Head: Normocephalic and atraumatic.  Tolerating secretions  Eyes: EOM are normal.   Neck: Normal range of motion.  Cardiovascular: Normal rate, regular rhythm and normal heart sounds.   Pulmonary/Chest: Effort normal and breath sounds normal.  Abdominal: Soft. She exhibits no distension. There is no tenderness.  Musculoskeletal: Normal range of motion.  PICC line normal and right upper extremity without surrounding signs of infection.  Neurological: She is alert and oriented to person, place, and time.  Skin: Skin is warm and dry.  Hives present on right arm anterior chest and back.  There is about 5-6 hives.  There is evidence of excoriation.  Psychiatric: She has a normal mood and affect. Judgment normal.    ED Course  Procedures (including critical care time)  Labs Reviewed - No data to display No results found.   No diagnosis found.    MDM  Improvement in symptoms with IV Benadryl and IV Solu-Medrol.  Given the patient's n.p.o. status from her gastric fistula she'll be sent home with topical hydrocortisone cream and topical Benadryl as I think this is her best option.  She'll follow up closely with her surgeon.        Lyanne Co, MD 08/16/12 0830

## 2012-08-16 NOTE — ED Notes (Signed)
MD at bedside. 

## 2012-08-16 NOTE — ED Notes (Signed)
Discharged by [place], Charity fundraiser

## 2012-08-16 NOTE — ED Notes (Addendum)
IV team called to bedside- picc line port clotted. Will give medication once picc line is patent.  Pt preferred IV to IM injections.

## 2012-08-18 ENCOUNTER — Telehealth (INDEPENDENT_AMBULATORY_CARE_PROVIDER_SITE_OTHER): Payer: Self-pay | Admitting: General Surgery

## 2012-08-18 NOTE — Telephone Encounter (Signed)
Called patient back to tell her that Dr [name] stated that she can do the benadryl syrup. And when I was talking to the patient she stated that she was having a cloudy drainage around her tube and it was red and painful. I talked to Dr [name] about the drain and told him what the patient had stated and he stated she can cut a 4 x 4 gauze up the middle of it and place it around the drain and that will help with the drainage coming out and help with the redness and pain from the tube, patient is ok with that and I told her that her home health will also can help her with the dressing around the tube. I reminded the patient about her apt with Dr [name] on 5-13 and to do lab work before office visit

## 2012-08-18 NOTE — Telephone Encounter (Signed)
Patient called in today stating she has hives over her chest, back and upper extremities. She went to the ER over the weekend and was given IV Benadryl and IV Solu-Medrol. Per the ER Note these were better when she left and they advised her to start topical hydrocortisone and benadryl cream because she is on TPN and they did not want to advise her to take oral benadryl. Patient states she did not get this topical ointment because she "knows it will not work" and she can't get it now because she is "locked in her house". She is wanting permission to use oral benadryl. She states hives have gotten worse since leaving the ER. Please advise. [phone].

## 2012-08-19 ENCOUNTER — Telehealth (INDEPENDENT_AMBULATORY_CARE_PROVIDER_SITE_OTHER): Payer: Self-pay

## 2012-08-19 ENCOUNTER — Emergency Department (HOSPITAL_COMMUNITY): Payer: Medicare Other

## 2012-08-19 ENCOUNTER — Emergency Department (HOSPITAL_COMMUNITY)
Admission: EM | Admit: 2012-08-19 | Discharge: 2012-08-20 | Disposition: A | Discharge status: Home / Self Care | Payer: Medicare Other | Attending: Emergency Medicine | Admitting: Emergency Medicine

## 2012-08-19 ENCOUNTER — Encounter (HOSPITAL_COMMUNITY): Payer: Self-pay | Admitting: *Deleted

## 2012-08-19 ENCOUNTER — Other Ambulatory Visit: Payer: Self-pay

## 2012-08-19 DIAGNOSIS — Z7901 Long term (current) use of anticoagulants: Secondary | ICD-10-CM | POA: Insufficient documentation

## 2012-08-19 DIAGNOSIS — F411 Generalized anxiety disorder: Secondary | ICD-10-CM | POA: Insufficient documentation

## 2012-08-19 DIAGNOSIS — M1990 Unspecified osteoarthritis, unspecified site: Secondary | ICD-10-CM | POA: Insufficient documentation

## 2012-08-19 DIAGNOSIS — Z8701 Personal history of pneumonia (recurrent): Secondary | ICD-10-CM | POA: Insufficient documentation

## 2012-08-19 DIAGNOSIS — IMO0001 Reserved for inherently not codable concepts without codable children: Secondary | ICD-10-CM | POA: Insufficient documentation

## 2012-08-19 DIAGNOSIS — Z90710 Acquired absence of both cervix and uterus: Secondary | ICD-10-CM | POA: Insufficient documentation

## 2012-08-19 DIAGNOSIS — K219 Gastro-esophageal reflux disease without esophagitis: Secondary | ICD-10-CM | POA: Insufficient documentation

## 2012-08-19 DIAGNOSIS — Z8739 Personal history of other diseases of the musculoskeletal system and connective tissue: Secondary | ICD-10-CM | POA: Insufficient documentation

## 2012-08-19 DIAGNOSIS — R1011 Right upper quadrant pain: Secondary | ICD-10-CM | POA: Insufficient documentation

## 2012-08-19 DIAGNOSIS — Z8659 Personal history of other mental and behavioral disorders: Secondary | ICD-10-CM | POA: Insufficient documentation

## 2012-08-19 DIAGNOSIS — Z9889 Other specified postprocedural states: Secondary | ICD-10-CM | POA: Insufficient documentation

## 2012-08-19 DIAGNOSIS — Z9089 Acquired absence of other organs: Secondary | ICD-10-CM | POA: Insufficient documentation

## 2012-08-19 DIAGNOSIS — Z79899 Other long term (current) drug therapy: Secondary | ICD-10-CM | POA: Insufficient documentation

## 2012-08-19 DIAGNOSIS — R109 Unspecified abdominal pain: Secondary | ICD-10-CM

## 2012-08-19 DIAGNOSIS — J449 Chronic obstructive pulmonary disease, unspecified: Secondary | ICD-10-CM | POA: Insufficient documentation

## 2012-08-19 DIAGNOSIS — Z8669 Personal history of other diseases of the nervous system and sense organs: Secondary | ICD-10-CM | POA: Insufficient documentation

## 2012-08-19 DIAGNOSIS — Z87891 Personal history of nicotine dependence: Secondary | ICD-10-CM | POA: Insufficient documentation

## 2012-08-19 DIAGNOSIS — J4489 Other specified chronic obstructive pulmonary disease: Secondary | ICD-10-CM | POA: Insufficient documentation

## 2012-08-19 DIAGNOSIS — Z8709 Personal history of other diseases of the respiratory system: Secondary | ICD-10-CM | POA: Insufficient documentation

## 2012-08-19 LAB — BASIC METABOLIC PANEL
Anion Gap: 11 (ref 7–16)
BUN: 32 mg/dL — ABNORMAL HIGH (ref 7–18)
Co2: 22 mmol/L (ref 21–32)
Osmolality: 277 (ref 275–301)
Potassium: 5.3 mmol/L — ABNORMAL HIGH (ref 3.5–5.1)
Sodium: 136 mmol/L (ref 136–145)

## 2012-08-19 LAB — CBC WITH DIFFERENTIAL/PLATELET
Basophil #: 0.1 10*3/uL (ref 0.0–0.1)
Basophil %: 1 %
Eosinophil %: 2.2 %
HCT: 27.5 % — ABNORMAL LOW (ref 35.0–47.0)
HGB: 9.3 g/dL — ABNORMAL LOW (ref 12.0–16.0)
Lymphocyte #: 2.6 10*3/uL (ref 1.0–3.6)
Lymphocyte %: 29.2 %
MCH: 28 pg (ref 26.0–34.0)
Monocyte %: 6.2 %
Platelet: 721 10*3/uL — ABNORMAL HIGH (ref 150–440)
RBC: 3.32 10*6/uL — ABNORMAL LOW (ref 3.80–5.20)
RDW: 18 % — ABNORMAL HIGH (ref 11.5–14.5)
WBC: 8.8 10*3/uL (ref 3.6–11.0)

## 2012-08-19 LAB — PHOSPHORUS: Phosphorus: 4.8 mg/dL (ref 2.5–4.9)

## 2012-08-19 LAB — HEPATIC FUNCTION PANEL A (ARMC)
Bilirubin,Total: 0.3 mg/dL (ref 0.2–1.0)
SGOT(AST): 24 U/L (ref 15–37)
SGPT (ALT): 34 U/L (ref 12–78)
Total Protein: 8.3 g/dL — ABNORMAL HIGH (ref 6.4–8.2)

## 2012-08-19 LAB — COMPREHENSIVE METABOLIC PANEL
ALT: 23 U/L (ref 0–35)
Albumin: 2.9 g/dL — ABNORMAL LOW (ref 3.5–5.2)
Alkaline Phosphatase: 134 U/L — ABNORMAL HIGH (ref 39–117)
Potassium: 3.8 mEq/L (ref 3.5–5.1)
Sodium: 131 mEq/L — ABNORMAL LOW (ref 135–145)
Total Protein: 8.9 g/dL — ABNORMAL HIGH (ref 6.0–8.3)

## 2012-08-19 LAB — MAGNESIUM: Magnesium: 1.7 mg/dL — ABNORMAL LOW

## 2012-08-19 MED ORDER — SODIUM CHLORIDE 0.9 % IV SOLN: INTRAVENOUS | Status: DC

## 2012-08-19 MED ORDER — SODIUM CHLORIDE 0.9 % IV BOLUS (SEPSIS)
250.0000 mL | Freq: Once | INTRAVENOUS | Status: AC
  Administered 2012-08-19: 250 mL via INTRAVENOUS

## 2012-08-19 MED ORDER — FENTANYL CITRATE 0.05 MG/ML IJ SOLN
100.0000 ug | Freq: Once | INTRAMUSCULAR | Status: AC
  Administered 2012-08-19: 100 ug via INTRAVENOUS
  Filled 2012-08-19: qty 2

## 2012-08-19 MED ORDER — IOHEXOL 300 MG/ML  SOLN: 50.0000 mL | Freq: Once | INTRAMUSCULAR | Status: AC | PRN

## 2012-08-19 NOTE — ED Provider Notes (Signed)
History     CSN: [id number]  Arrival date & time 08/19/12  4098   First MD Initiated Contact with Patient 08/19/12 2241      Chief Complaint  Patient presents with  . Open Wound    JP drain replacement    (Consider location/radiation/quality/duration/timing/severity/associated sxs/prior treatment) HPI Comments: Cynthia Bowen is [name] who presents for evaluation of abdominal pain. The pain started and worsened, today after her JP drain, that was in the right lower quadrant, fell out. Since the drain fell out; she has been getting more drainage from the, skin defect than she had prior to the drain coming out. She denies fever or chills, nausea, or vomiting. She is getting TPN, 18 hours each day. She had repair of gastric fistula, about 6 weeks ago. She required readmission to the hospital for pain. There are no other known modifying factors.  The history is provided by the patient.    Past Medical History  Diagnosis Date  . COPD (chronic obstructive pulmonary disease)   . Pneumonia 12-2011  . GERD (gastroesophageal reflux disease)   . Headache   . Arthritis     osteoarthritis  . Allergy   . Depression   . Neuromuscular disorder   . Osteoporosis   . Bronchitis     CURRENTLY AS OF 06/30/12 - HAS COUGH AND FINISHED ANTIBIOTIC FOR BRONCHITIS  . Fibromyalgia   . Pain     ABDOMINAL PAIN AND NAUSEA  . Pain     SOMETIMES PAIN RIGHT EAR AND NECK--STATES CAUSED BY A "LUMP" ON BACK OF EAR--USES KENALOG CREAM TOPICALLY AS NEEDED.    Past Surgical History  Procedure Laterality Date  . Abdominal hysterectomy    . Esophagogastroduodenoscopy  04/18/2012    Procedure: ESOPHAGOGASTRODUODENOSCOPY (EGD);  Surgeon: [name], MD;  Location: [facility];  Service: Endoscopy;  Laterality: N/A;  . Eus  05/29/2012    Procedure: UPPER ENDOSCOPIC ULTRASOUND (EUS) LINEAR;  Surgeon: [name], MD;  Location: [facility];  Service: Endoscopy;  Laterality: N/A;  . Appendectomy     . Spine surgery      CERVICAL SPINE SURGERY X 2 - INCLUDING FUSION; LUMBAR SURGERY FOR RUPTURED DISC  . Eye surgery      BILATERAL CATARACT EXTRACTIONS  . Laparoscopic abdominal exploration N/A 07/02/2012    Procedure: converted to laparotomy with gastric biopsy;  Surgeon: [name]. [name], MD;  Location: [facility];  Service: General;  Laterality: N/A;  Laparoscopic Gastric Biospy attempted.   . Laparotomy N/A 07/07/2012    Procedure: EXPLORATORY LAPAROTOMY repair of gastric perforation with omental graham patch, drainage of abdominal abcess;  Surgeon: [name]. [name], MD;  Location: [facility];  Service: General;  Laterality: N/A;  . Stomach surgery  07/07/2012    Omental patch of gastric perforation  . Laparotomy N/A 07/13/2012    Procedure: EXPLORATORY LAPAROTOMY repair gastric leak;  Surgeon: Mariella Saa, MD;  Location: [facility];  Service: General;  Laterality: N/A;    Family History  Problem Relation Age of Onset  . COPD Mother   . Hypertension Maternal Grandmother     History  Substance Use Topics  . Smoking status: Former Smoker -- 0.50 packs/day for 35 years    Types: Cigarettes    Start date: 01/04/2003    Quit date: 04/17/2005  . Smokeless tobacco: Never Used  . Alcohol Use: No    OB History   Grav Para Term Preterm Abortions TAB SAB Ect Mult  Living                  Review of Systems  All other systems reviewed and are negative.    Allergies  Avelox; Alendronate sodium; Aspirin; Codeine; Doxycycline; Fluconazole; Hydrocodone; Neurontin; Nsaids; Sertraline hcl; and Sulfa antibiotics  Home Medications   Current Outpatient Rx  Name  Route  Sig  Dispense  Refill  . diphenhydrAMINE (BENADRYL) 12.5 MG/5ML liquid   Per Tube   Place 12.5 mg into feeding tube 4 (four) times daily as needed for itching or allergies.         Marland Kitchen enoxaparin (LOVENOX) 60 MG/0.6ML injection   Subcutaneous   Inject 0.45 mLs (45 mg total) into the skin every 12 (twelve) hours.   60  Syringe   2   . HYDROcodone-acetaminophen (HYCET) 7.5-325 mg/15 ml solution   Per Tube   Place 15 mLs into feeding tube 4 (four) times daily as needed for pain.         . pantoprazole sodium (PROTONIX) 40 mg/20 mL PACK   Per Tube   Place 40 mg into feeding tube daily.         . promethazine-phenylephrine (PROMETHAZINE-PHENYLEPHRINE) 6.25-5 MG/5ML SYRP   Per Tube   Place 5 mLs into feeding tube every 4 (four) hours as needed. For nausea         . albuterol (PROVENTIL HFA;VENTOLIN HFA) 108 (90 BASE) MCG/ACT inhaler   Inhalation   Inhale 2 puffs into the lungs every 4 (four) hours as needed for wheezing (cough, shortness of breath or wheezing.).   1 Inhaler   1   . EPINEPHrine (EPIPEN) 0.3 mg/0.3 mL DEVI   Intramuscular   Inject 0.3 mLs (0.3 mg total) into the muscle once.   1 Device   2     BP 113/72  Pulse 89  Temp(Src) 97.7 F (36.5 C) (Oral)  Resp 20  Wt 87 lb 2 oz (39.52 kg)  BMI 14.5 kg/m2  SpO2 99%  Physical Exam  Nursing note and vitals reviewed. Constitutional: She is oriented to person, place, and time. She appears well-developed. She appears distressed (mild).  Frail  HENT:  Head: Normocephalic and atraumatic.  Eyes: Conjunctivae and EOM are normal. Pupils are equal, round, and reactive to light.  Neck: Normal range of motion and phonation normal. Neck supple.  Cardiovascular: Normal rate, regular rhythm and intact distal pulses.   Pulmonary/Chest: Effort normal and breath sounds normal. She exhibits no tenderness.  Abdominal: Soft. She exhibits no distension. There is no tenderness (right upper and lower quadrants, moderate.). There is no guarding.  JP tube site, right lower quadrant is oozing, clear, somewhat frothy material. The skin surrounding, the abdominal wall tract site, it is erythematous. There is no associated fluctuance.  Musculoskeletal: Normal range of motion.  Neurological: She is alert and oriented to person, place, and time. She has  normal strength. She exhibits normal muscle tone.  Skin: Skin is warm and dry.  Psychiatric: Her behavior is normal. Judgment and thought content normal.  Anxious    ED Course  Procedures (including critical care time) Medications  0.9 %  sodium chloride infusion (not administered)  iohexol (OMNIPAQUE) 300 MG/ML solution 50 mL (not administered)  sodium chloride 0.9 % bolus 250 mL (250 mLs Intravenous New Bag/Given 08/19/12 2313)  fentaNYL (SUBLIMAZE) injection 100 mcg (100 mcg Intravenous Given 08/19/12 2320)    Patient Vitals for the past 24 hrs:  BP Temp Temp src Pulse Resp SpO2  Weight  08/19/12 2334 113/72 mmHg 97.7 F (36.5 C) Oral 89 20 99 % -  08/19/12 1937 106/72 mmHg 98.5 F (36.9 C) Oral 111 16 95 % 87 lb 2 oz (39.52 kg)    Consultation 23:10-Dr. Daphine Deutscher for general surgery and I discussed the case. He feels that the fact that the JP drain is out, is actually good, because she now has increased drainage, which will likely improve her overall status. He recommends treating the drainage with a ostomy bag to collect secretion; and treat her pain with analgesia.  Reevaluation: 12:45-she feels better after the S/P TAC was applied to the area of drainage.  Labs Reviewed  CBC WITH DIFFERENTIAL - Abnormal; Notable for the following:    RBC 3.59 (*)    Hemoglobin 9.8 (*)    HCT 29.4 (*)    RDW 17.1 (*)    Platelets 719 (*)    All other components within normal limits  COMPREHENSIVE METABOLIC PANEL - Abnormal; Notable for the following:    Sodium 131 (*)    Glucose, Bld 126 (*)    BUN 36 (*)    Total Protein 8.9 (*)    Albumin 2.9 (*)    Alkaline Phosphatase 134 (*)    Total Bilirubin 0.2 (*)    GFR calc non Af Amer 61 (*)    GFR calc Af Amer 70 (*)    All other components within normal limits  URINALYSIS, ROUTINE W REFLEX MICROSCOPIC     1. Abdominal pain       MDM  Nonspecific abdominal pain with draining surgical tract. No evidence for acute intra-abdominal  infection. She is stable for discharge with the current management. She has a short-term appointment, scheduled. Nursing Notes Reviewed/ Care Coordinated, and agree without changes. Applicable Imaging Reviewed.  Interpretation of Laboratory Data incorporated into ED treatment   Plan: Home Medications- usual; Home Treatments- rest; Recommended follow up- Gen. surgery on 5/one/14          Flint Melter, MD 08/20/12 [phone]

## 2012-08-19 NOTE — ED Notes (Signed)
Patient is alert and oriented x3.  She is complaining of JP drain falling out post abdominal surgery from 08/02/12. She currently she has a rash around the wound site and states her pain is 7 of 10 with nausea.

## 2012-08-19 NOTE — Telephone Encounter (Signed)
Patient calling into office today concerned that her JP Drain has falling out.  Patient states her drainage output has been around 10 mL's and that she empties it once a day.  Patient denies any fevers, abdominal pain or drainage from drain site.  Patient reports having placed dry gauze over her drain site and will wait for further instructions from our office.

## 2012-08-19 NOTE — Telephone Encounter (Signed)
Called patient back and talked to her and told her to stay NPO until she come in to see Dr [name] on 08-22-12 @ 10:30 and she will go tomorrow 08-20-12 to get her lab work done before office visit.

## 2012-08-20 ENCOUNTER — Other Ambulatory Visit (INDEPENDENT_AMBULATORY_CARE_PROVIDER_SITE_OTHER): Payer: Self-pay | Admitting: Surgery

## 2012-08-20 LAB — URINALYSIS, ROUTINE W REFLEX MICROSCOPIC
Bilirubin Urine: NEGATIVE
Glucose, UA: NEGATIVE mg/dL
Ketones, ur: NEGATIVE mg/dL
Leukocytes, UA: NEGATIVE
pH: 5 (ref 5.0–8.0)

## 2012-08-20 LAB — CBC WITH DIFFERENTIAL/PLATELET
Basophils Absolute: 0 10*3/uL (ref 0.0–0.1)
Eosinophils Absolute: 0.2 10*3/uL (ref 0.0–0.7)
Hemoglobin: 9.8 g/dL — ABNORMAL LOW (ref 12.0–15.0)
Lymphocytes Relative: 29 % (ref 12–46)
MCHC: 33.3 g/dL (ref 30.0–36.0)
Neutrophils Relative %: 62 % (ref 43–77)
RDW: 17.1 % — ABNORMAL HIGH (ref 11.5–15.5)

## 2012-08-20 LAB — CBC
Hemoglobin: 9.8 g/dL — ABNORMAL LOW (ref 12.0–15.0)
MCHC: 32.8 g/dL (ref 30.0–36.0)
Platelets: 771 10*3/uL — ABNORMAL HIGH (ref 150–400)
RBC: 3.56 MIL/uL — ABNORMAL LOW (ref 3.87–5.11)

## 2012-08-20 LAB — URINE MICROSCOPIC-ADD ON

## 2012-08-20 NOTE — ED Notes (Signed)
Marja Kays, [phone] Home number.  [phone] cell.

## 2012-08-20 NOTE — ED Notes (Signed)
EDP and AC at bedside. Pt stating she is ready to go home and sleep in her own bed. Pt called her nephew, he stated he would be here shortly to pick her up.

## 2012-08-20 NOTE — ED Notes (Signed)
Pt ambulatory to BR at this time.

## 2012-08-20 NOTE — ED Notes (Signed)
Pt's nephee

## 2012-08-20 NOTE — ED Provider Notes (Signed)
3:43 AM Patient sleeping comfortably. When awakened she complains of some mild burning at the ostomy bag site. Her abdomen is soft. There is no indication at this time for admission. She has home health care who can help her with her ostomy supplies.  Hanley Seamen, MD 08/20/12 [phone]

## 2012-08-21 ENCOUNTER — Encounter (INDEPENDENT_AMBULATORY_CARE_PROVIDER_SITE_OTHER): Payer: Self-pay

## 2012-08-21 LAB — COMPREHENSIVE METABOLIC PANEL
ALT: 21 U/L (ref 0–35)
Albumin: 3.7 g/dL (ref 3.5–5.2)
Alkaline Phosphatase: 114 U/L (ref 39–117)
Glucose, Bld: 77 mg/dL (ref 70–99)
Potassium: 5.6 mEq/L — ABNORMAL HIGH (ref 3.5–5.3)
Sodium: 133 mEq/L — ABNORMAL LOW (ref 135–145)
Total Protein: 8.8 g/dL — ABNORMAL HIGH (ref 6.0–8.3)

## 2012-08-22 ENCOUNTER — Other Ambulatory Visit (INDEPENDENT_AMBULATORY_CARE_PROVIDER_SITE_OTHER): Payer: Self-pay | Admitting: Surgery

## 2012-08-22 ENCOUNTER — Ambulatory Visit (INDEPENDENT_AMBULATORY_CARE_PROVIDER_SITE_OTHER): Payer: Medicare Other | Admitting: Surgery

## 2012-08-22 ENCOUNTER — Encounter (INDEPENDENT_AMBULATORY_CARE_PROVIDER_SITE_OTHER): Payer: Self-pay | Admitting: Surgery

## 2012-08-22 VITALS — BP 118/78 | HR 77 | Temp 98.0°F | Resp 18 | Ht 65.0 in | Wt 87.4 lb

## 2012-08-22 DIAGNOSIS — Z5189 Encounter for other specified aftercare: Secondary | ICD-10-CM

## 2012-08-22 DIAGNOSIS — K316 Fistula of stomach and duodenum: Secondary | ICD-10-CM | POA: Insufficient documentation

## 2012-08-22 NOTE — Progress Notes (Signed)
The patient is six weeks out from an open gastric biopsy, that was benign.  Unfortunately, the patient was extremely malnourished at the time of surgery, and has had multiple leaks from the biopsy site, with several further surgeries to try to repair the leak.  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  She is maintained on TNA via PICC line and is mostly NPO except for a few meds.  Recently, she was hospitalized for bilateral PE's and is currently anticoagulated on Lovenox with plans to change to Xarelto when taking PO's.  She was discharged home on 4/24.  In a very unfortunate turn of events, her drain was inadvertently removed at home.  She has had some drainage of some fluid from the old drain site, which has caused some skin irritation.  She has had two ED visits in the last weeks, the first for a generalized skin rash and the second for the skin irritation at the drain site.  The drain site is now managed with a small ostomy appliance with some improvement in her symptoms.  She remains NPO.    [facility] is managing her TNA at home.  She currently does not have Home Health nursing.  Previously, she had [facility], but they did not provide satisfactory service.  Labs this week. 4/29 at [facility] WBC 8.8 Hgb 9.3 Platelets 721 Cr 1.00 K 5.3 Prealbumin 29  Lab Results  Component Value Date   WBC 7.8 08/20/2012   HGB 9.8* 08/20/2012   HCT 29.9* 08/20/2012   MCV 84.0 08/20/2012   PLT 771* 08/20/2012   Lab Results  Component Value Date   CREATININE 0.90 08/20/2012   BUN 31* 08/20/2012   NA 133* 08/20/2012   K 5.6* 08/20/2012   CL 103 08/20/2012   CO2 19 08/20/2012   Lab Results  Component Value Date   ALT 21 08/20/2012   AST 28 08/20/2012   ALKPHOS 114 08/20/2012   BILITOT 0.4 08/20/2012   Prealbumin 27.7 on 08/20/12  Her drain site shows some local skin irritation, but remains relatively small. 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  If no leak, will start clear liquids.  Recheck in 2 weeks.  Home Health nursing will be arranged to help with wound management of fistula output.  [name]. [name], MD, [facility] Surgery  08/22/2012 4:25 PM

## 2012-08-26 ENCOUNTER — Other Ambulatory Visit (INDEPENDENT_AMBULATORY_CARE_PROVIDER_SITE_OTHER): Payer: Self-pay | Admitting: Surgery

## 2012-08-26 ENCOUNTER — Other Ambulatory Visit: Payer: Self-pay

## 2012-08-26 ENCOUNTER — Ambulatory Visit
Admission: RE | Admit: 2012-08-26 | Discharge: 2012-08-26 | Disposition: A | Discharge status: Home / Self Care | Payer: Medicare Other | Source: Ambulatory Visit | Attending: Surgery | Admitting: Surgery

## 2012-08-26 DIAGNOSIS — K316 Fistula of stomach and duodenum: Secondary | ICD-10-CM

## 2012-08-26 LAB — CBC WITH DIFFERENTIAL/PLATELET
Basophil %: 0.5 %
Eosinophil #: 0.1 10*3/uL (ref 0.0–0.7)
Eosinophil %: 1.5 %
HCT: 31.2 % — ABNORMAL LOW (ref 35.0–47.0)
HGB: 10.6 g/dL — ABNORMAL LOW (ref 12.0–16.0)
Lymphocyte #: 1.4 10*3/uL (ref 1.0–3.6)
Lymphocyte %: 16.8 %
MCH: 28.3 pg (ref 26.0–34.0)
Monocyte #: 0.3 x10 3/mm (ref 0.2–0.9)
Monocyte %: 3.9 %
Neutrophil #: 6.3 10*3/uL (ref 1.4–6.5)
Platelet: 516 10*3/uL — ABNORMAL HIGH (ref 150–440)
WBC: 8.2 10*3/uL (ref 3.6–11.0)

## 2012-08-26 LAB — COMPREHENSIVE METABOLIC PANEL
Albumin: 3.2 g/dL — ABNORMAL LOW (ref 3.4–5.0)
Alkaline Phosphatase: 127 U/L (ref 50–136)
Anion Gap: 6 — ABNORMAL LOW (ref 7–16)
BUN: 29 mg/dL — ABNORMAL HIGH (ref 7–18)
Bilirubin,Total: 0.2 mg/dL (ref 0.2–1.0)
Calcium, Total: 9.9 mg/dL (ref 8.5–10.1)
Chloride: 108 mmol/L — ABNORMAL HIGH (ref 98–107)
Creatinine: 0.79 mg/dL (ref 0.60–1.30)
EGFR (African American): >60
Glucose: 83 mg/dL (ref 65–99)
Potassium: 4 mmol/L (ref 3.5–5.1)
SGOT(AST): 20 U/L (ref 15–37)
SGPT (ALT): 17 U/L (ref 12–78)
Sodium: 138 mmol/L (ref 136–145)
Total Protein: 9.1 g/dL — ABNORMAL HIGH (ref 6.4–8.2)

## 2012-08-27 ENCOUNTER — Telehealth (INDEPENDENT_AMBULATORY_CARE_PROVIDER_SITE_OTHER): Payer: Self-pay | Admitting: *Deleted

## 2012-08-27 ENCOUNTER — Telehealth (INDEPENDENT_AMBULATORY_CARE_PROVIDER_SITE_OTHER): Payer: Self-pay | Admitting: General Surgery

## 2012-08-27 NOTE — Telephone Encounter (Signed)
Patient is aware to pick up  zofran ODC  At [facility]  fo r nausea

## 2012-08-27 NOTE — Telephone Encounter (Signed)
Patient is aware of RX  zofran  will be  [facility]ed [facility] [address]  She was instructed  To put in under tongue and no need to swallow.

## 2012-08-27 NOTE — Telephone Encounter (Signed)
While patient was in office picking up supplies she stated she is still having difficulty with nausea.  Patient states her current medication was making her more nauseated so she just stopped using it.  Spoke to Tsuei MD who approved Zofran ODT 4mg  1 tablet every 4 hours as needed for nausea #40 no refills.  [facility] [id number]

## 2012-09-01 ENCOUNTER — Telehealth (INDEPENDENT_AMBULATORY_CARE_PROVIDER_SITE_OTHER): Payer: Self-pay

## 2012-09-01 NOTE — Telephone Encounter (Signed)
Pt called to request a refill on Hydrocodone 7.5/325 liquid.  I paged Dr [name].

## 2012-09-02 ENCOUNTER — Encounter (INDEPENDENT_AMBULATORY_CARE_PROVIDER_SITE_OTHER): Payer: Self-pay | Admitting: Surgery

## 2012-09-02 ENCOUNTER — Ambulatory Visit (INDEPENDENT_AMBULATORY_CARE_PROVIDER_SITE_OTHER): Payer: Medicare Other | Admitting: Surgery

## 2012-09-02 VITALS — BP 88/61 | HR 60 | Temp 97.0°F | Resp 12 | Ht 65.0 in | Wt 89.2 lb

## 2012-09-02 DIAGNOSIS — K316 Fistula of stomach and duodenum: Secondary | ICD-10-CM

## 2012-09-02 MED ORDER — HYDROCODONE-ACETAMINOPHEN 7.5-325 MG/15ML PO SOLN: 15.0000 mL | Freq: Four times a day (QID) | ORAL | Status: DC | PRN

## 2012-09-02 NOTE — Progress Notes (Signed)
The patient is here for a followup of her gastrocutaneous fistula. Overall she seems to be doing quite well. She had a Gastrografin study last week that did show a very tiny persistent fistula. However subsequent to that time the drainage has stopped. She has some localized tenderness right around her midline incision but otherwise her abdomen is fairly nontender. She has been afebrile.  Lab work obtained 08/26/12 showed a white blood cell count of 8.2 hemoglobin 10.6 platelet count 516 electrolytes are within normal limits liver function tests are within normal limits albumin is 3.2 total protein 9.1 and her last prealbumin was 22.  UGI 08/26/12 Clinical Data: Gastrocutaneous fistula.  UPPER GI SERIES WITH KUB  Technique: Routine upper GI series was performed with water-  soluble contrast  Fluoroscopy Time: 4 minutes 12 seconds ( slow pulsed fluoroscopy  adjusted for body habitus)  Comparison: CT scan dated 08/05/2012 and prior upper GI dated  03/25 and 07/10/2012  Findings: There is a persistent leak from the anterior aspect of  the distal antrum extending to the skin surface. Stomach otherwise  appears normal. Contrast passes into the duodenal bulb and  duodenal C-loop.  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  Original Report Authenticated By: Francene Boyers, M.D.    Will begin some full liquids - protein supplements.  If the patient begins to have more pain, fever, etc., will obtain CT scan of the abdomen and pelvis.  It appears that the gastrocutaneous fistula may finally be healing.  Continue TNA for now.  Refill pain meds   [name]. [name], MD, [facility] Surgery  General/ Trauma Surgery  09/02/2012 4:13 PM

## 2012-09-06 ENCOUNTER — Encounter (HOSPITAL_COMMUNITY): Payer: Self-pay | Admitting: *Deleted

## 2012-09-06 ENCOUNTER — Inpatient Hospital Stay (HOSPITAL_COMMUNITY)
Admission: EM | Admit: 2012-09-06 | Discharge: 2012-09-19 | DRG: 356 | Disposition: A | Discharge status: Home Health | Payer: Medicare Other | Attending: Surgery | Admitting: Surgery

## 2012-09-06 ENCOUNTER — Emergency Department (HOSPITAL_COMMUNITY): Payer: Medicare Other

## 2012-09-06 DIAGNOSIS — Z681 Body mass index (BMI) 19 or less, adult: Secondary | ICD-10-CM

## 2012-09-06 DIAGNOSIS — K219 Gastro-esophageal reflux disease without esophagitis: Secondary | ICD-10-CM | POA: Diagnosis present

## 2012-09-06 DIAGNOSIS — E43 Unspecified severe protein-calorie malnutrition: Secondary | ICD-10-CM | POA: Diagnosis present

## 2012-09-06 DIAGNOSIS — Z79899 Other long term (current) drug therapy: Secondary | ICD-10-CM

## 2012-09-06 DIAGNOSIS — Z86718 Personal history of other venous thrombosis and embolism: Secondary | ICD-10-CM

## 2012-09-06 DIAGNOSIS — K651 Peritoneal abscess: Principal | ICD-10-CM | POA: Diagnosis present

## 2012-09-06 DIAGNOSIS — M810 Age-related osteoporosis without current pathological fracture: Secondary | ICD-10-CM | POA: Diagnosis present

## 2012-09-06 DIAGNOSIS — J449 Chronic obstructive pulmonary disease, unspecified: Secondary | ICD-10-CM | POA: Diagnosis present

## 2012-09-06 DIAGNOSIS — M1990 Unspecified osteoarthritis, unspecified site: Secondary | ICD-10-CM | POA: Diagnosis present

## 2012-09-06 DIAGNOSIS — T8183XA Persistent postprocedural fistula, initial encounter: Secondary | ICD-10-CM | POA: Diagnosis present

## 2012-09-06 DIAGNOSIS — Y838 Other surgical procedures as the cause of abnormal reaction of the patient, or of later complication, without mention of misadventure at the time of the procedure: Secondary | ICD-10-CM | POA: Diagnosis present

## 2012-09-06 DIAGNOSIS — Z87891 Personal history of nicotine dependence: Secondary | ICD-10-CM

## 2012-09-06 DIAGNOSIS — Z981 Arthrodesis status: Secondary | ICD-10-CM

## 2012-09-06 DIAGNOSIS — F329 Major depressive disorder, single episode, unspecified: Secondary | ICD-10-CM | POA: Diagnosis present

## 2012-09-06 DIAGNOSIS — F3289 Other specified depressive episodes: Secondary | ICD-10-CM | POA: Diagnosis present

## 2012-09-06 DIAGNOSIS — J4489 Other specified chronic obstructive pulmonary disease: Secondary | ICD-10-CM | POA: Diagnosis present

## 2012-09-06 LAB — COMPREHENSIVE METABOLIC PANEL
Albumin: 2.7 g/dL — ABNORMAL LOW (ref 3.5–5.2)
Alkaline Phosphatase: 99 U/L (ref 39–117)
BUN: 21 mg/dL (ref 6–23)
Calcium: 9.3 mg/dL (ref 8.4–10.5)
Creatinine, Ser: 0.59 mg/dL (ref 0.50–1.10)
Potassium: 4.3 mEq/L (ref 3.5–5.1)
Total Protein: 7.8 g/dL (ref 6.0–8.3)

## 2012-09-06 LAB — URINALYSIS, ROUTINE W REFLEX MICROSCOPIC
Leukocytes, UA: NEGATIVE
Nitrite: NEGATIVE
Specific Gravity, Urine: 1.013 (ref 1.005–1.030)
pH: 5.5 (ref 5.0–8.0)

## 2012-09-06 LAB — CBC WITH DIFFERENTIAL/PLATELET
Basophils Relative: 0 % (ref 0–1)
Eosinophils Absolute: 0 10*3/uL (ref 0.0–0.7)
Hemoglobin: 10.2 g/dL — ABNORMAL LOW (ref 12.0–15.0)
MCH: 27.3 pg (ref 26.0–34.0)
MCHC: 34.6 g/dL (ref 30.0–36.0)
Monocytes Relative: 5 % (ref 3–12)
Neutrophils Relative %: 86 % — ABNORMAL HIGH (ref 43–77)

## 2012-09-06 LAB — CG4 I-STAT (LACTIC ACID): Lactic Acid, Venous: 1.34 mmol/L (ref 0.5–2.2)

## 2012-09-06 LAB — LIPASE, BLOOD: Lipase: 46 U/L (ref 11–59)

## 2012-09-06 MED ORDER — ONDANSETRON HCL 4 MG/2ML IJ SOLN
4.0000 mg | Freq: Once | INTRAMUSCULAR | Status: AC
  Administered 2012-09-06: 4 mg via INTRAVENOUS
  Filled 2012-09-06: qty 2

## 2012-09-06 MED ORDER — DIPHENHYDRAMINE HCL 50 MG/ML IJ SOLN
25.0000 mg | Freq: Once | INTRAMUSCULAR | Status: AC
  Administered 2012-09-06: 25 mg via INTRAVENOUS
  Filled 2012-09-06: qty 1

## 2012-09-06 MED ORDER — PIPERACILLIN-TAZOBACTAM 3.375 G IVPB: 3.3750 g | Freq: Three times a day (TID) | INTRAVENOUS | Status: DC

## 2012-09-06 MED ORDER — HEPARIN (PORCINE) IN NACL 100-0.45 UNIT/ML-% IJ SOLN
1200.0000 [IU]/h | INTRAMUSCULAR | Status: DC
  Administered 2012-09-06: 550 [IU]/h via INTRAVENOUS
  Administered 2012-09-08: 1000 [IU]/h via INTRAVENOUS
  Administered 2012-09-08: 1200 [IU]/h via INTRAVENOUS
  Filled 2012-09-06 (×3): qty 250

## 2012-09-06 MED ORDER — SODIUM CHLORIDE 0.9 % IV SOLN
100.0000 mg | INTRAVENOUS | Status: DC
  Administered 2012-09-07 – 2012-09-16 (×10): 100 mg via INTRAVENOUS
  Filled 2012-09-06 (×12): qty 100

## 2012-09-06 MED ORDER — HYDROMORPHONE HCL PF 1 MG/ML IJ SOLN
1.0000 mg | Freq: Once | INTRAMUSCULAR | Status: AC
  Administered 2012-09-06: 1 mg via INTRAVENOUS
  Filled 2012-09-06: qty 1

## 2012-09-06 MED ORDER — PIPERACILLIN-TAZOBACTAM 3.375 G IVPB
3.3750 g | Freq: Once | INTRAVENOUS | Status: AC
  Administered 2012-09-06: 3.375 g via INTRAVENOUS
  Filled 2012-09-06: qty 50

## 2012-09-06 MED ORDER — ACETAMINOPHEN 325 MG PO TABS: 650.0000 mg | ORAL_TABLET | Freq: Four times a day (QID) | ORAL | Status: DC | PRN

## 2012-09-06 MED ORDER — SODIUM CHLORIDE 0.9 % IV SOLN
100.0000 mg | INTRAVENOUS | Status: AC
  Filled 2012-09-06: qty 100

## 2012-09-06 MED ORDER — IOHEXOL 300 MG/ML  SOLN
50.0000 mL | Freq: Once | INTRAMUSCULAR | Status: AC | PRN
  Administered 2012-09-06: 50 mL via ORAL

## 2012-09-06 MED ORDER — IOHEXOL 300 MG/ML  SOLN
90.0000 mL | Freq: Once | INTRAMUSCULAR | Status: AC | PRN
  Administered 2012-09-06: 90 mL via INTRAVENOUS

## 2012-09-06 MED ORDER — PIPERACILLIN-TAZOBACTAM 3.375 G IVPB
3.3750 g | Freq: Three times a day (TID) | INTRAVENOUS | Status: DC
  Administered 2012-09-06 – 2012-09-17 (×31): 3.375 g via INTRAVENOUS
  Filled 2012-09-06 (×34): qty 50

## 2012-09-06 MED ORDER — ALBUTEROL SULFATE HFA 108 (90 BASE) MCG/ACT IN AERS: 2.0000 | INHALATION_SPRAY | RESPIRATORY_TRACT | Status: DC | PRN

## 2012-09-06 MED ORDER — POTASSIUM CHLORIDE IN NACL 20-0.9 MEQ/L-% IV SOLN
INTRAVENOUS | Status: DC
  Administered 2012-09-06 – 2012-09-07 (×2): via INTRAVENOUS
  Filled 2012-09-06 (×5): qty 1000

## 2012-09-06 MED ORDER — MORPHINE SULFATE 2 MG/ML IJ SOLN
1.0000 mg | INTRAMUSCULAR | Status: DC | PRN
  Administered 2012-09-06 – 2012-09-08 (×13): 2 mg via INTRAVENOUS
  Filled 2012-09-06 (×13): qty 1

## 2012-09-06 MED ORDER — PROMETHAZINE HCL 25 MG/ML IJ SOLN
12.5000 mg | Freq: Four times a day (QID) | INTRAMUSCULAR | Status: DC | PRN
  Filled 2012-09-06: qty 1

## 2012-09-06 MED ORDER — ACETAMINOPHEN 650 MG RE SUPP: 650.0000 mg | Freq: Four times a day (QID) | RECTAL | Status: DC | PRN

## 2012-09-06 MED ORDER — ONDANSETRON HCL 4 MG/2ML IJ SOLN
4.0000 mg | Freq: Four times a day (QID) | INTRAMUSCULAR | Status: DC | PRN
  Administered 2012-09-06 – 2012-09-12 (×5): 4 mg via INTRAVENOUS
  Filled 2012-09-06 (×6): qty 2

## 2012-09-06 NOTE — ED Notes (Signed)
Patient transported to CT 

## 2012-09-06 NOTE — Progress Notes (Addendum)
ANTIBIOTIC CONSULT NOTE - INITIAL  Pharmacy Consult for micafungin Indication: R/o abd sepsis  Allergies  Allergen Reactions  . Avelox (Moxifloxacin Hcl In Nacl) Nausea And Vomiting  . Alendronate Sodium Nausea And Vomiting  . Aspirin Nausea Only  . Codeine Nausea And Vomiting  . Doxycycline   . Fluconazole   . Hydrocodone Nausea And Vomiting    GI distress  . Neurontin (Gabapentin) Other (See Comments)    Mood changes   . Nsaids Other (See Comments)    Severe gastritis & perforation - avoid NSAIDs when possible  . Sertraline Hcl Other (See Comments)    Hallucinations   . Sulfa Antibiotics Rash    Patient Measurements:    Adjusted Body Weight:   Vital Signs: Temp: 100.9 F (38.3 C) (05/17 0840) Temp src: Oral (05/17 0840) BP: 105/70 mmHg (05/17 0840) Pulse Rate: 99 (05/17 0840) Intake/Output from previous day:   Intake/Output from this shift:    Labs:  Recent Labs  09/06/12 0921  WBC 13.8*  HGB 10.2*  PLT 411*  CREATININE 0.59   The CrCl is unknown because both a height and weight (above a minimum accepted value) are required for this calculation. No results found for this basename: VANCOTROUGH, VANCOPEAK, VANCORANDOM, GENTTROUGH, GENTPEAK, GENTRANDOM, TOBRATROUGH, TOBRAPEAK, TOBRARND, AMIKACINPEAK, AMIKACINTROU, AMIKACIN,  in the last 72 hours   Microbiology: No results found for this or any previous visit (from the past 720 hour(s)).  Medical History: Past Medical History  Diagnosis Date  . COPD (chronic obstructive pulmonary disease)   . Pneumonia 12-2011  . GERD (gastroesophageal reflux disease)   . Headache   . Arthritis     osteoarthritis  . Allergy   . Depression   . Neuromuscular disorder   . Osteoporosis   . Bronchitis     CURRENTLY AS OF 06/30/12 - HAS COUGH AND FINISHED ANTIBIOTIC FOR BRONCHITIS  . Fibromyalgia   . Pain     ABDOMINAL PAIN AND NAUSEA  . Pain     SOMETIMES PAIN RIGHT EAR AND NECK--STATES CAUSED BY A "LUMP" ON BACK OF  EAR--USES KENALOG CREAM TOPICALLY AS NEEDED.    Medications:   (Not in a hospital admission) Scheduled:  .  HYDROmorphone (DILAUDID) injection  1 mg Intravenous Once  . ondansetron  4 mg Intravenous Once   Assessment: 67 yo with a hx bowel perf and enterocutaneous fistula who presented with abd/epigastric pain. She has been on chronic TPN so her risk of fungal infection is high. She has an allergy to fluconazole. D/w with PA in ED, will change to micafungin.  She will also be started on zosyn in the ED.   Plan:   Micafungin 100mg  IV q24 Zosyn 3.375g IV q8  Ulyses [place] 09/06/2012,11:01 AM  Addendum:  Pt is on lovenox for hx PE. She had her dose this AM at 0700. The plan is to transition to heparin so that it can be turned off more quickly for drain placement.   Plan  Start heparin at 550 units/hr at 1700 today Check 6hr heparin level

## 2012-09-06 NOTE — H&P (Signed)
Cynthia Bowen is an 67 y.o. female.   Chief Complaint: gastrocutaneous fistula, pain, fevers HPI:  Pt is a 67 yo F s/p gastric wall biopsy in march of 2014 who developed gastrocutaneous fistula.  She has not required surgical treatment of it.  She has been on TNA.  She was doing well Tuesday when she was seen in our office.  She had UGI with demonstrated tiny fistula early this month.  She stopped having drainage shortly after the UGI.  She was started on liquids this week.  Subsequently, she developed increased pain, fevers, and nausea.    Past Medical History  Diagnosis Date  . COPD (chronic obstructive pulmonary disease)   . Pneumonia 12-2011  . GERD (gastroesophageal reflux disease)   . Headache   . Arthritis     osteoarthritis  . Allergy   . Depression   . Neuromuscular disorder   . Osteoporosis   . Bronchitis     CURRENTLY AS OF 06/30/12 - HAS COUGH AND FINISHED ANTIBIOTIC FOR BRONCHITIS  . Fibromyalgia   . Pain     ABDOMINAL PAIN AND NAUSEA  . Pain     SOMETIMES PAIN RIGHT EAR AND NECK--STATES CAUSED BY A "LUMP" ON BACK OF EAR--USES KENALOG CREAM TOPICALLY AS NEEDED.    Past Surgical History  Procedure Laterality Date  . Abdominal hysterectomy    . Esophagogastroduodenoscopy  04/18/2012    Procedure: ESOPHAGOGASTRODUODENOSCOPY (EGD);  Surgeon: [name], MD;  Location: [facility];  Service: Endoscopy;  Laterality: N/A;  . Eus  05/29/2012    Procedure: UPPER ENDOSCOPIC ULTRASOUND (EUS) LINEAR;  Surgeon: [name], MD;  Location: [facility];  Service: Endoscopy;  Laterality: N/A;  . Appendectomy    . Spine surgery      CERVICAL SPINE SURGERY X 2 - INCLUDING FUSION; LUMBAR SURGERY FOR RUPTURED DISC  . Eye surgery      BILATERAL CATARACT EXTRACTIONS  . Laparoscopic abdominal exploration N/A 07/02/2012    Procedure: converted to laparotomy with gastric biopsy;  Surgeon: [name]. [name], MD;  Location: [facility];  Service: General;  Laterality: N/A;  Laparoscopic  Gastric Biospy attempted.   . Laparotomy N/A 07/07/2012    Procedure: EXPLORATORY LAPAROTOMY repair of gastric perforation with omental graham patch, drainage of abdominal abcess;  Surgeon: [name]. [name], MD;  Location: [facility];  Service: General;  Laterality: N/A;  . Stomach surgery  07/07/2012    Omental patch of gastric perforation  . Laparotomy N/A 07/13/2012    Procedure: EXPLORATORY LAPAROTOMY repair gastric leak;  Surgeon: Mariella Saa, MD;  Location: [facility];  Service: General;  Laterality: N/A;    Family History  Problem Relation Age of Onset  . COPD Mother   . Hypertension Maternal Grandmother    Social History:  reports that she quit smoking about 7 years ago. Her smoking use included Cigarettes. She started smoking about 9 years ago. She has a 17.5 pack-year smoking history. She has never used smokeless tobacco. She reports that she does not drink alcohol or use illicit drugs.  Allergies:  Allergies  Allergen Reactions  . Avelox (Moxifloxacin Hcl In Nacl) Nausea And Vomiting  . Alendronate Sodium Nausea And Vomiting  . Aspirin Nausea Only  . Codeine Nausea And Vomiting  . Doxycycline   . Fluconazole   . Hydrocodone Nausea And Vomiting    GI distress  . Neurontin (Gabapentin) Other (See Comments)    Mood changes   . Nsaids Other (See Comments)  Severe gastritis & perforation - avoid NSAIDs when possible  . Sertraline Hcl Other (See Comments)    Hallucinations   . Sulfa Antibiotics Rash    Medications  Hospital Medications Outpatient Medications   HYDROmorphone (DILAUDID) injection 1 mg  micafungin (MYCAMINE) 100 mg in sodium chloride 0.9 % 100 mL IVPB  micafungin (MYCAMINE) 100 mg in sodium chloride 0.9 % 100 mL IVPB  ondansetron (ZOFRAN) injection 4 mg  piperacillin-tazobactam (ZOSYN) IVPB 3.375 g  piperacillin-tazobactam (ZOSYN) IVPB 3.375 g     Results for orders placed during the hospital encounter of 09/06/12 (from the past 48 hour(s))  CBC  WITH DIFFERENTIAL     Status: Abnormal   Collection Time    09/06/12  9:21 AM      Result Value Range   WBC 13.8 (*) 4.0 - 10.5 K/uL   RBC 3.73 (*) 3.87 - 5.11 MIL/uL   Hemoglobin 10.2 (*) 12.0 - 15.0 g/dL   HCT 11.9 (*) 14.7 - 82.9 %   MCV 79.1  78.0 - 100.0 fL   MCH 27.3  26.0 - 34.0 pg   MCHC 34.6  30.0 - 36.0 g/dL   RDW 56.2  13.0 - 86.5 %   Platelets 411 (*) 150 - 400 K/uL   Neutrophils Relative % 86 (*) 43 - 77 %   Neutro Abs 11.9 (*) 1.7 - 7.7 K/uL   Lymphocytes Relative 9 (*) 12 - 46 %   Lymphs Abs 1.2  0.7 - 4.0 K/uL   Monocytes Relative 5  3 - 12 %   Monocytes Absolute 0.7  0.1 - 1.0 K/uL   Eosinophils Relative 0  0 - 5 %   Eosinophils Absolute 0.0  0.0 - 0.7 K/uL   Basophils Relative 0  0 - 1 %   Basophils Absolute 0.0  0.0 - 0.1 K/uL  COMPREHENSIVE METABOLIC PANEL     Status: Abnormal   Collection Time    09/06/12  9:21 AM      Result Value Range   Sodium 133 (*) 135 - 145 mEq/L   Potassium 4.3  3.5 - 5.1 mEq/L   Chloride 97  96 - 112 mEq/L   CO2 21  19 - 32 mEq/L   Glucose, Bld 112 (*) 70 - 99 mg/dL   BUN 21  6 - 23 mg/dL   Creatinine, Ser 7.84  0.50 - 1.10 mg/dL   Calcium 9.3  8.4 - 69.6 mg/dL   Total Protein 7.8  6.0 - 8.3 g/dL   Albumin 2.7 (*) 3.5 - 5.2 g/dL   AST 15  0 - 37 U/L   ALT 9  0 - 35 U/L   Alkaline Phosphatase 99  39 - 117 U/L   Total Bilirubin 0.1 (*) 0.3 - 1.2 mg/dL   GFR calc non Af Amer >90  >90 mL/min   GFR calc Af Amer >90  >90 mL/min   Comment:            The eGFR has been calculated     using the CKD EPI equation.     This calculation has not been     validated in all clinical     situations.     eGFR's persistently     <90 mL/min signify     possible Chronic Kidney Disease.  LIPASE, BLOOD     Status: None   Collection Time    09/06/12  9:21 AM      Result Value Range   Lipase  46  11 - 59 U/L  CG4 I-STAT (LACTIC ACID)     Status: None   Collection Time    09/06/12  9:30 AM      Result Value Range   Lactic Acid, Venous  1.34  0.5 - 2.2 mmol/L   No results found.  Review of Systems  Constitutional: Positive for fever and chills.  Gastrointestinal: Positive for nausea and abdominal pain.  All other systems reviewed and are negative.    Blood pressure 105/70, pulse 99, temperature 100.9 F (38.3 C), temperature source Oral, resp. rate 18, SpO2 98.00%. Physical Exam  Constitutional: She is oriented to person, place, and time. She appears well-developed and well-nourished. She appears distressed (mild distress).  HENT:  Head: Normocephalic and atraumatic.  Mouth/Throat: Oropharynx is clear and moist.  Eyes: Pupils are equal, round, and reactive to light. No scleral icterus.  Neck: Normal range of motion. Neck supple. No tracheal deviation present. No thyromegaly present.  Cardiovascular: Normal rate, regular rhythm, normal heart sounds and intact distal pulses.  Exam reveals no gallop and no friction rub.   No murmur heard. Respiratory: Effort normal and breath sounds normal. No respiratory distress. She has no wheezes. She has no rales. She exhibits no tenderness.  GI: Soft. Bowel sounds are normal. She exhibits no distension and no mass. There is tenderness (tender in RUQ around fistula). There is guarding (mild voluntary guarding at fistula stie). There is no rebound.  Musculoskeletal: Normal range of motion. She exhibits no edema and no tenderness.  Neurological: She is alert and oriented to person, place, and time. Coordination normal.  Skin: Skin is warm and dry. No rash noted. She is not diaphoretic. No erythema. No pallor.  Psychiatric: She has a normal mood and affect. Her behavior is normal. Judgment and thought content normal.     Assessment/Plan Gastrocutaneous fistula-  CT to evaluate for abscess, cellulitis, or frank intraperitoneal gastric leak. Plan will be determined after that. Would give empiric antibiotics. Pt resistant to being admitted.     Deari Sessler 09/06/2012, 11:27  AM

## 2012-09-06 NOTE — ED Provider Notes (Signed)
History     CSN: [id number]  Arrival date & time 09/06/12  0102   First MD Initiated Contact with Patient 09/06/12 [phone]      Chief Complaint  Patient presents with  . 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 Patient reports receiving TPN, however she has restarted liquid diet recently. She continues to have loose bowel movements and is currently passing gas. She has been using liquid hydrocodone for pain. No chest pain or shortness of breath. No urinary symptoms.  Patient is a 67 y.o. female presenting with abdominal pain. The history is provided by the patient and medical records.  Abdominal Pain Associated symptoms: nausea and vomiting   Associated symptoms: no chest pain, no cough, no diarrhea, no dysuria, no fever and no sore throat     Past Medical History  Diagnosis Date  . COPD (chronic obstructive pulmonary disease)   . Pneumonia 12-2011  . GERD (gastroesophageal reflux disease)   . Headache   . Arthritis     osteoarthritis  . Allergy   . Depression   . Neuromuscular disorder   . Osteoporosis   . Bronchitis     CURRENTLY AS OF 06/30/12 - HAS COUGH AND FINISHED ANTIBIOTIC FOR BRONCHITIS  . Fibromyalgia   . Pain     ABDOMINAL PAIN AND NAUSEA  . Pain     SOMETIMES PAIN RIGHT EAR AND NECK--STATES CAUSED BY A "LUMP" ON BACK OF EAR--USES KENALOG CREAM TOPICALLY AS NEEDED.    Past Surgical History  Procedure Laterality Date  . Abdominal hysterectomy    . Esophagogastroduodenoscopy  04/18/2012    Procedure: ESOPHAGOGASTRODUODENOSCOPY (EGD);  Surgeon: [name], MD;  Location: [facility];  Service: Endoscopy;  Laterality: N/A;  . Eus  05/29/2012    Procedure: UPPER ENDOSCOPIC  ULTRASOUND (EUS) LINEAR;  Surgeon: [name], MD;  Location: [facility];  Service: Endoscopy;  Laterality: N/A;  . Appendectomy    . Spine surgery      CERVICAL SPINE SURGERY X 2 - INCLUDING FUSION; LUMBAR SURGERY FOR RUPTURED DISC  . Eye surgery      BILATERAL CATARACT EXTRACTIONS  . Laparoscopic abdominal exploration N/A 07/02/2012    Procedure: converted to laparotomy with gastric biopsy;  Surgeon: [name]. [name], MD;  Location: [facility];  Service: General;  Laterality: N/A;  Laparoscopic Gastric Biospy attempted.   . Laparotomy N/A 07/07/2012    Procedure: EXPLORATORY LAPAROTOMY repair of gastric perforation with omental graham patch, drainage of abdominal abcess;  Surgeon: [name]. [name], MD;  Location: [facility];  Service: General;  Laterality: N/A;  . Stomach surgery  07/07/2012    Omental patch of gastric perforation  . Laparotomy N/A 07/13/2012    Procedure: EXPLORATORY LAPAROTOMY repair gastric leak;  Surgeon: Mariella Saa, MD;  Location: [facility];  Service: General;  Laterality: N/A;    Family History  Problem Relation Age of Onset  . COPD Mother   . Hypertension Maternal Grandmother     History  Substance Use Topics  . Smoking status: Former Smoker -- 0.50 packs/day for 35 years    Types: Cigarettes    Start date: 01/04/2003    Quit date: 04/17/2005  . Smokeless tobacco: Never Used  . Alcohol Use: No    OB History   Grav Para Term Preterm Abortions  TAB SAB Ect Mult Living                  Review of Systems  Constitutional: Negative for fever.  HENT: Negative for sore throat and rhinorrhea.   Eyes: Negative for redness.  Respiratory: Negative for cough.   Cardiovascular: Negative for chest pain.  Gastrointestinal: Positive for nausea, vomiting and abdominal pain. Negative for diarrhea and blood in stool.  Genitourinary: Negative for dysuria.  Musculoskeletal: Negative for myalgias.  Skin: Negative for rash.  Neurological: Negative for headaches.     Allergies  Avelox; Alendronate sodium; Aspirin; Codeine; Doxycycline; Fluconazole; Hydrocodone; Neurontin; Nsaids; Sertraline hcl; and Sulfa antibiotics  Home Medications   Current Outpatient Rx  Name  Route  Sig  Dispense  Refill  . albuterol (PROVENTIL HFA;VENTOLIN HFA) 108 (90 BASE) MCG/ACT inhaler   Inhalation   Inhale 2 puffs into the lungs every 4 (four) hours as needed for wheezing (cough, shortness of breath or wheezing.).   1 Inhaler   1   . diphenhydrAMINE (BENADRYL) 12.5 MG/5ML liquid   Per Tube   Place 12.5 mg into feeding tube 4 (four) times daily as needed for itching or allergies.         Marland Kitchen enoxaparin (LOVENOX) 60 MG/0.6ML injection   Subcutaneous   Inject 0.45 mLs (45 mg total) into the skin every 12 (twelve) hours.   60 Syringe   2   . EPINEPHrine (EPIPEN) 0.3 mg/0.3 mL DEVI   Intramuscular   Inject 0.3 mLs (0.3 mg total) into the muscle once.   1 Device   2   . HYDROcodone-acetaminophen (HYCET) 7.5-325 mg/15 ml solution   Per Tube   Place 15 mLs into feeding tube 4 (four) times daily as needed for pain.   120 mL   0   . pantoprazole sodium (PROTONIX) 40 mg/20 mL PACK   Per Tube   Place 40 mg into feeding tube daily.         . promethazine-phenylephrine (PROMETHAZINE-PHENYLEPHRINE) 6.25-5 MG/5ML SYRP   Per Tube   Place 5 mLs into feeding tube every 4 (four) hours as needed. For nausea           BP 105/70  Pulse 99  Temp(Src) 100.9 F (38.3 C) (Oral)  Resp 18  SpO2 98%  Physical Exam  Nursing note and vitals reviewed. Constitutional: She appears well-developed and well-nourished.  HENT:  Head: Normocephalic and atraumatic.  Eyes: Conjunctivae are normal. Right eye exhibits no discharge. Left eye exhibits no discharge.  Neck: Normal range of motion. Neck supple.  Cardiovascular: Normal rate, regular rhythm and normal heart sounds.   Pulmonary/Chest: Effort normal and breath sounds normal.  Abdominal: Soft. She exhibits no  distension. Bowel sounds are decreased. There is tenderness. There is no rebound and no guarding.    Neurological: She is alert.  Skin: Skin is warm and dry.  Psychiatric: She has a normal mood and affect.    ED Course  Procedures (including critical care time)  Labs Reviewed  CBC WITH DIFFERENTIAL - Abnormal; Notable for the following:    WBC 13.8 (*)    RBC 3.73 (*)    Hemoglobin 10.2 (*)    HCT 29.5 (*)    Platelets 411 (*)    Neutrophils Relative % 86 (*)    Neutro Abs 11.9 (*)    Lymphocytes Relative 9 (*)    All other components within normal limits  COMPREHENSIVE METABOLIC PANEL - Abnormal; Notable for the following:  Sodium 133 (*)    Glucose, Bld 112 (*)    Albumin 2.7 (*)    Total Bilirubin 0.1 (*)    All other components within normal limits  LIPASE, BLOOD  URINALYSIS, ROUTINE W REFLEX MICROSCOPIC  CG4 I-STAT (LACTIC ACID)   Ct Abdomen Pelvis W Contrast  09/06/2012   *RADIOLOGY REPORT*  Clinical Data: History of enterocutaneous fistula.  Worsening upper abdominal pain, fever, and vomiting.  CT ABDOMEN AND PELVIS WITH CONTRAST  Technique:  Multidetector CT imaging of the abdomen and pelvis was performed following the standard protocol during bolus administration of intravenous contrast.  Contrast: 90mL OMNIPAQUE IOHEXOL 300 MG/ML  SOLN  Comparison: CT chest abdomen pelvis 08/05/2012 and CT abdomen pelvis 07/07/2012 and 04/17/2012.  Upper GI 08/26/2012  Findings: Airspace disease visualized in the peripheral aspect of the inferior left lung base has improved since the CT chest of 08/05/2012 but not completely resolved.  There is a tiny residual left pleural effusion.  A small pericardial effusion appears simple, and is slightly decreased compared to the chest CT of 08/05/2012.  The previously seen drain in the right abdominal wall has been removed since the CT of 08/05/2012.  There is a visible tract from the previous drain, which contains locules of gas and fluid. There  is some density in the subcutaneous tissues of the right anterior abdominal wall where the drain was previously.  No locules of gas are seen within the anterior abdominal wall drain tract/scarring.  There are locules of gas in the anterior midline peritoneal cavity, just deep to the umbilicus, with an associated small amount of fluid and peripheral enhancement. This fluid and gas collection measures approximately 5.8 x 1.6 x 2.7 cm and may reflect a small abscess related to persistent gastric leak.  Again noted is some thickening of the distal gastric wall may be due to peptic ulcer disease.  No evidence of gastric outlet obstruction.  Bowel loops are normal in caliber.  Stable bilateral renal cysts.  Stable nonobstructing stones in the right kidney.  There are total of three stones, the largest measuring 6 mm.  No hydronephrosis of either kidney.  The adrenal glands, pancreas, common bile duct, spleen liver and gallbladder are within normal limits.  There is extensive atherosclerotic calcification of the aortoiliac vasculature without aneurysm.  Urinary bladder is normal.  Hysterectomy.  No adnexal mass. Degenerative disc disease at L4-L5.  No acute osseous abnormality.  IMPRESSION:  1. Findings suspicious for a persistent gastric leak.  There is an air-fluid collection with peripheral enhancement in the anterior midline abdomen just deep to the umbilicus, with a locule of gas tracking through the anterior abdominal wall at the level of the umbilicus, suggesting a persistent gastrocutaneous fistula. This fluid collection is suspicious for a focal abscess and measures approximately 5.8 x 1.6 x 2.7 cm.        2.  There is gas seen along the tract of a previous abdominal drain in the midline and right abdomen.  This is also suspicious for a leak of gastric contents into this tract. 3. Persistent wall thickening of the distal stomach.  4.  Stable renal cysts bilaterally and nonobstructing right renal stones. 5.   Improving aeration.  Improving aeration of the left lower lobe. 6.  Decreasing size of the small left pleural effusion. 7.  Small pericardial effusion, decreasing.   Original Report Authenticated By: Britta Mccreedy, M.D.     1. Intra-abdominal abscess post-procedure, initial encounter  8:49 AM Patient seen and examined. Work-up initiated. Medications ordered. Previous medical records reviewed. D/w Dr. Rubin Payor.   Vital signs reviewed and are as follows: Filed Vitals:   09/06/12 0840  BP: 105/70  Pulse: 99  Temp: 100.9 F (38.3 C)  Resp: 18   10:05 AM Spoke with surgery (Dr. Donell Beers through OR nurse) and requested consult.   Dr. Donell Beers has seen. Patient does not want admission until CT results are known. Reccs: zosyn + coverage for yeast.   I consulted pharmacy as patient is allergic to diflucan. Reccs: Micafungin. Ordered.   CT reviewed by myself. Shows likely abscess and persistent gastric leak. Pt agrees to admission. Dr. Donell Beers aware and will admit.   Patient's pain controlled after more pain medication.   BP 92/52  Pulse 84  Temp(Src) 100.9 F (38.3 C) (Oral)  Resp 18  SpO2 97%     MDM  Admit for fever, abdominal abscess/gastric leak. Pt receiving zosyn and micafungin to cover.         Renne Crigler, PA-C 09/06/12 1339

## 2012-09-06 NOTE — ED Notes (Signed)
Reports hernia repair in march. Started having abd pain again yesterday with fever. Vomiting this am. Pt receives tpn, recently starting drinking boost drinks.

## 2012-09-06 NOTE — Progress Notes (Signed)
Seen, agree with above.   

## 2012-09-06 NOTE — Progress Notes (Signed)
CT scan shows possible gastri leak with fluid collection that may be an abscess. She needs IR to drain if possible.  She has also been on Lovenox BID for bilat PE.  I will ask pharmacy to place her on IV heparin for now and IR to consider for drain placement.  We can get her back to lovenox after she has had drain placement/ evaluation completed.

## 2012-09-06 NOTE — Progress Notes (Signed)
NURSING PROGRESS NOTE  Cynthia Bowen [id number] Admission Data: 09/06/2012 3:57 PM Attending Provider: [name]. Tsuei, MD [id number], [name], MD Code Status: full   Cynthia Bowen is a 67 y.o. female patient admitted from ED:  -No acute distress noted.  -No complaints of shortness of breath.  -No complaints of chest pain.    Blood pressure 99/58, pulse 79, temperature 100.9 F (38.3 C), temperature source Oral, resp. rate 18, height 5\' 5"  (1.651 m), weight 40.37 kg (89 lb), SpO2 96.00%.   IV Fluids:  PICC in place, occlusive dsg intact without redness, IV cath upper arm right, condition patent and no redness normal saline.   Allergies:  Avelox; Alendronate sodium; Aspirin; Codeine; Doxycycline; Fluconazole; Hydrocodone; Neurontin; Nsaids; Sertraline hcl; and Sulfa antibiotics  Past Medical History:   has a past medical history of COPD (chronic obstructive pulmonary disease); Pneumonia (12-2011); GERD (gastroesophageal reflux disease); Headache; Arthritis; Allergy; Depression; Neuromuscular disorder; Osteoporosis; Bronchitis; Fibromyalgia; Pain; and Pain.  Past Surgical History:   has past surgical history that includes Abdominal hysterectomy; Esophagogastroduodenoscopy (04/18/2012); EUS (05/29/2012); Appendectomy; Spine surgery; Eye surgery; Laparoscopic abdominal exploration (N/A, 07/02/2012); laparotomy (N/A, 07/07/2012); Stomach surgery (07/07/2012); and laparotomy (N/A, 07/13/2012).  Social History:   reports that she quit smoking about 7 years ago. Her smoking use included Cigarettes. She started smoking about 9 years ago. She has a 17.5 pack-year smoking history. She has never used smokeless tobacco. She reports that she does not drink alcohol or use illicit drugs.  Skin: intact  Patient/Family orientated to room. Information packet given to patient/family. Admission inpatient armband information verified with patient/family to include name and date of birth and placed on patient  arm. Side rails up x 2, fall assessment and education completed with patient/family. Patient/family able to verbalize understanding of risk associated with falls and verbalized understanding to call for assistance before getting out of bed. Call light within reach. Patient/family able to voice and demonstrate understanding of unit orientation instructions.    Will continue to evaluate and treat per MD orders.   Madelin Rear, MSN, RN, [employer]

## 2012-09-06 NOTE — ED Provider Notes (Signed)
Medical screening examination/treatment/procedure(s) were performed by non-physician practitioner and as supervising physician I was immediately available for consultation/collaboration.  Symphonie Schneiderman R. Yajaira Doffing, MD 09/06/12 1420 

## 2012-09-06 NOTE — Progress Notes (Signed)
Blood pressure 94/52, pulse 90, temperature 98.4 F (36.9 C), temperature source Oral, resp. rate 16, SpO2 100.00% RA. Patient request sleep aide, Dr. Marilynne Halsted notified of patrients request and current VS verbal orders obtained for benadryl 25mg  IV x1, orders entered into [facility]. Cynthia Bowen [facility]

## 2012-09-07 ENCOUNTER — Inpatient Hospital Stay (HOSPITAL_COMMUNITY): Payer: Medicare Other

## 2012-09-07 ENCOUNTER — Encounter (HOSPITAL_COMMUNITY): Payer: Self-pay | Admitting: Radiology

## 2012-09-07 LAB — COMPREHENSIVE METABOLIC PANEL
AST: 17 U/L (ref 0–37)
Albumin: 2.5 g/dL — ABNORMAL LOW (ref 3.5–5.2)
Alkaline Phosphatase: 96 U/L (ref 39–117)
Chloride: 98 mEq/L (ref 96–112)
Potassium: 4.2 mEq/L (ref 3.5–5.1)
Total Bilirubin: 0.4 mg/dL (ref 0.3–1.2)
Total Protein: 7.8 g/dL (ref 6.0–8.3)

## 2012-09-07 LAB — DIFFERENTIAL
Basophils Absolute: 0 10*3/uL (ref 0.0–0.1)
Basophils Relative: 0 % (ref 0–1)
Lymphocytes Relative: 14 % (ref 12–46)
Monocytes Absolute: 0.6 10*3/uL (ref 0.1–1.0)
Neutro Abs: 7.9 10*3/uL — ABNORMAL HIGH (ref 1.7–7.7)
Neutrophils Relative %: 79 % — ABNORMAL HIGH (ref 43–77)

## 2012-09-07 LAB — CBC
HCT: 28.2 % — ABNORMAL LOW (ref 36.0–46.0)
Hemoglobin: 9.7 g/dL — ABNORMAL LOW (ref 12.0–15.0)
RDW: 15.1 % (ref 11.5–15.5)
WBC: 10.1 10*3/uL (ref 4.0–10.5)

## 2012-09-07 LAB — HEPARIN LEVEL (UNFRACTIONATED): Heparin Unfractionated: <0.1 IU/mL — ABNORMAL LOW (ref 0.30–0.70)

## 2012-09-07 LAB — TRIGLYCERIDES: Triglycerides: 107 mg/dL (ref <150)

## 2012-09-07 LAB — MAGNESIUM: Magnesium: 1.9 mg/dL (ref 1.5–2.5)

## 2012-09-07 LAB — CHOLESTEROL, TOTAL: Cholesterol: 108 mg/dL (ref 0–200)

## 2012-09-07 MED ORDER — SODIUM CHLORIDE 0.9 % IJ SOLN
10.0000 mL | INTRAMUSCULAR | Status: DC | PRN
  Administered 2012-09-07 – 2012-09-15 (×7): 10 mL
  Administered 2012-09-16: 20 mL
  Administered 2012-09-17: 10 mL
  Administered 2012-09-19: 20 mL

## 2012-09-07 MED ORDER — CLINIMIX E/DEXTROSE (5/20) 5 % IV SOLN
INTRAVENOUS | Status: AC
  Administered 2012-09-07: 18:00:00 via INTRAVENOUS
  Filled 2012-09-07: qty 2000

## 2012-09-07 MED ORDER — SODIUM CHLORIDE 0.9 % IV BOLUS (SEPSIS)
500.0000 mL | Freq: Once | INTRAVENOUS | Status: AC
  Administered 2012-09-07: 500 mL via INTRAVENOUS

## 2012-09-07 MED ORDER — IOHEXOL 300 MG/ML  SOLN
50.0000 mL | Freq: Once | INTRAMUSCULAR | Status: AC | PRN
  Administered 2012-09-07: 10 mL

## 2012-09-07 MED ORDER — MIDAZOLAM HCL 2 MG/2ML IJ SOLN
INTRAMUSCULAR | Status: AC
  Filled 2012-09-07: qty 4

## 2012-09-07 MED ORDER — SODIUM CHLORIDE 0.9 % IJ SOLN
10.0000 mL | Freq: Two times a day (BID) | INTRAMUSCULAR | Status: DC
  Administered 2012-09-10 – 2012-09-16 (×5): 10 mL

## 2012-09-07 MED ORDER — WHITE PETROLATUM GEL
Status: AC
  Filled 2012-09-07: qty 5

## 2012-09-07 MED ORDER — DIPHENHYDRAMINE HCL 50 MG/ML IJ SOLN
25.0000 mg | Freq: Four times a day (QID) | INTRAMUSCULAR | Status: DC | PRN
  Administered 2012-09-07 – 2012-09-08 (×2): 25 mg via INTRAVENOUS
  Filled 2012-09-07 (×2): qty 1

## 2012-09-07 MED ORDER — POTASSIUM CHLORIDE IN NACL 20-0.9 MEQ/L-% IV SOLN
INTRAVENOUS | Status: DC
  Filled 2012-09-07 (×2): qty 1000

## 2012-09-07 MED ORDER — FENTANYL CITRATE 0.05 MG/ML IJ SOLN
INTRAMUSCULAR | Status: AC
  Filled 2012-09-07: qty 4

## 2012-09-07 MED ORDER — POTASSIUM CHLORIDE IN NACL 20-0.9 MEQ/L-% IV SOLN
INTRAVENOUS | Status: DC
  Administered 2012-09-08: 06:00:00 via INTRAVENOUS
  Filled 2012-09-07 (×2): qty 1000

## 2012-09-07 MED ORDER — FENTANYL CITRATE 0.05 MG/ML IJ SOLN
INTRAMUSCULAR | Status: AC | PRN
  Administered 2012-09-07 (×2): 25 ug via INTRAVENOUS

## 2012-09-07 MED ORDER — MIDAZOLAM HCL 2 MG/2ML IJ SOLN
INTRAMUSCULAR | Status: AC | PRN
  Administered 2012-09-07: 0.5 mg via INTRAVENOUS
  Administered 2012-09-07: 1 mg via INTRAVENOUS

## 2012-09-07 MED ORDER — DIPHENHYDRAMINE HCL 50 MG/ML IJ SOLN
INTRAMUSCULAR | Status: AC
  Administered 2012-09-07: 25 mg via INTRAVENOUS
  Filled 2012-09-07: qty 1

## 2012-09-07 MED ORDER — CLINIMIX E/DEXTROSE (5/20) 5 % IV SOLN
INTRAVENOUS | Status: DC
  Filled 2012-09-07: qty 1600

## 2012-09-07 NOTE — Progress Notes (Signed)
Spoke to pharmacist Onalee Hua to clarify if a heparin level needed to be draw soon. Okay to continue running heparin at current rate and will recheck heparin level at 0600. Cynthia Bowen [facility]

## 2012-09-07 NOTE — H&P (Signed)
HPI: Cynthia Bowen is an 67 y.o. female who underwent gastric biopsy a few months ago and has subsequently had to have Ex lap X 2 for gastric leak. 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 A surgical drain fell out and the tract apparently closed up after a few days of drainage. She recently had an upper GI on 5/6 which showed the fistula to be patent but it was apparently not draining from the skin as the pt reports she hasnt had any drainage for a few weeks. She had new onset or worsening abd pain and fevers and has been admitted after new CT shows evidence of intra-bdominal abscess associated with gastrocutaneous fistula. IR is asked to assist with drainage. PMHx and meds reviewed. Pt had been on Lovenox for PE, but has not had any since yesterday. She has been placed on IV heparin for the time being.  Past Medical History:  Past Medical History  Diagnosis Date  . COPD (chronic obstructive pulmonary disease)   . Pneumonia 12-2011  . GERD (gastroesophageal reflux disease)   . Headache   . Arthritis     osteoarthritis  . Allergy   . Depression   . Neuromuscular disorder   . Osteoporosis   . Bronchitis     CURRENTLY AS OF 06/30/12 - HAS COUGH AND FINISHED ANTIBIOTIC FOR BRONCHITIS  . Fibromyalgia   . Pain     ABDOMINAL PAIN AND NAUSEA  . Pain     SOMETIMES PAIN RIGHT EAR AND NECK--STATES CAUSED BY A "LUMP" ON BACK OF EAR--USES KENALOG CREAM TOPICALLY AS NEEDED.    Past Surgical History:  Past Surgical History  Procedure Laterality Date  . Abdominal hysterectomy    . Esophagogastroduodenoscopy  04/18/2012    Procedure: ESOPHAGOGASTRODUODENOSCOPY (EGD);  Surgeon: [name], MD;  Location: [facility];  Service: Endoscopy;  Laterality: N/A;  . Eus  05/29/2012    Procedure: UPPER ENDOSCOPIC ULTRASOUND (EUS) LINEAR;  Surgeon: [name], MD;  Location: [facility];  Service: Endoscopy;  Laterality: N/A;  . Appendectomy    . Spine surgery      CERVICAL SPINE  SURGERY X 2 - INCLUDING FUSION; LUMBAR SURGERY FOR RUPTURED DISC  . Eye surgery      BILATERAL CATARACT EXTRACTIONS  . Laparoscopic abdominal exploration N/A 07/02/2012    Procedure: converted to laparotomy with gastric biopsy;  Surgeon: [name]. [name], MD;  Location: [facility];  Service: General;  Laterality: N/A;  Laparoscopic Gastric Biospy attempted.   . Laparotomy N/A 07/07/2012    Procedure: EXPLORATORY LAPAROTOMY repair of gastric perforation with omental graham patch, drainage of abdominal abcess;  Surgeon: [name]. [name], MD;  Location: [facility];  Service: General;  Laterality: N/A;  . Stomach surgery  07/07/2012    Omental patch of gastric perforation  . Laparotomy N/A 07/13/2012    Procedure: EXPLORATORY LAPAROTOMY repair gastric leak;  Surgeon: Mariella Saa, MD;  Location: [facility];  Service: General;  Laterality: N/A;    Family History:  Family History  Problem Relation Age of Onset  . COPD Mother   . Hypertension Maternal Grandmother     Social History:  reports that she quit smoking about 7 years ago. Her smoking use included Cigarettes. She started smoking about 9 years ago. She has a 17.5 pack-year smoking history. She has never used smokeless tobacco. She reports that she does not drink alcohol or use illicit drugs.  Allergies:  Allergies  Allergen Reactions  . Avelox (Moxifloxacin Hcl  In Nacl) Nausea And Vomiting  . Alendronate Sodium Nausea And Vomiting  . Aspirin Nausea Only  . Codeine Nausea And Vomiting  . Doxycycline   . Fluconazole   . Hydrocodone Nausea And Vomiting    GI distress  . Neurontin (Gabapentin) Other (See Comments)    Mood changes   . Nsaids Other (See Comments)    Severe gastritis & perforation - avoid NSAIDs when possible  . Sertraline Hcl Other (See Comments)    Hallucinations   . Sulfa Antibiotics Rash    Medications: Medications Prior to Admission  Medication Sig Dispense Refill  . albuterol (PROVENTIL HFA;VENTOLIN HFA) 108 (90  BASE) MCG/ACT inhaler Inhale 2 puffs into the lungs every 4 (four) hours as needed for wheezing (cough, shortness of breath or wheezing.).  1 Inhaler  1  . diphenhydrAMINE (BENADRYL) 12.5 MG/5ML liquid Place 12.5 mg into feeding tube 4 (four) times daily as needed for itching or allergies.      Marland Kitchen enoxaparin (LOVENOX) 60 MG/0.6ML injection Inject 0.45 mLs (45 mg total) into the skin every 12 (twelve) hours.  60 Syringe  2  . EPINEPHrine (EPIPEN) 0.3 mg/0.3 mL DEVI Inject 0.3 mLs (0.3 mg total) into the muscle once.  1 Device  2  . HYDROcodone-acetaminophen (HYCET) 7.5-325 mg/15 ml solution Place 15 mLs into feeding tube 4 (four) times daily as needed for pain.  120 mL  0    Please HPI for pertinent positives, otherwise complete 10 system ROS negative.  Physical Exam: BP 88/63  Pulse 76  Temp(Src) 98.3 F (36.8 C) (Oral)  Resp 16  Ht 5\' 5"  (1.651 m)  Wt 92 lb 12.8 oz (42.094 kg)  BMI 15.44 kg/m2  SpO2 93% Body mass index is 15.44 kg/(m^2).   General Appearance:  Alert, cooperative, no distress, appears stated age  Head:  Normocephalic, without obvious abnormality, atraumatic  ENT: Unremarkable  Neck: Supple, symmetrical, trachea midline  Lungs:   Clear to auscultation bilaterally, no w/r/r, respirations unlabored without use of accessory muscles.  Chest Wall:  No tenderness or deformity  Heart:  Regular rate and rhythm, S1, S2 normal, no murmur, rub or gallop.  Abdomen:   Soft,, tender in RUQ and epigastric area, no peritonitis. RUQ scar from prior drain/fisttula site.  Neurologic: Normal affect, no gross deficits.   Results for orders placed during the hospital encounter of 09/06/12 (from the past 48 hour(s))  CBC WITH DIFFERENTIAL     Status: Abnormal   Collection Time    09/06/12  9:21 AM      Result Value Range   WBC 13.8 (*) 4.0 - 10.5 K/uL   RBC 3.73 (*) 3.87 - 5.11 MIL/uL   Hemoglobin 10.2 (*) 12.0 - 15.0 g/dL   HCT 16.1 (*) 09.6 - 04.5 %   MCV 79.1  78.0 - 100.0 fL    MCH 27.3  26.0 - 34.0 pg   MCHC 34.6  30.0 - 36.0 g/dL   RDW 40.9  81.1 - 91.4 %   Platelets 411 (*) 150 - 400 K/uL   Neutrophils Relative % 86 (*) 43 - 77 %   Neutro Abs 11.9 (*) 1.7 - 7.7 K/uL   Lymphocytes Relative 9 (*) 12 - 46 %   Lymphs Abs 1.2  0.7 - 4.0 K/uL   Monocytes Relative 5  3 - 12 %   Monocytes Absolute 0.7  0.1 - 1.0 K/uL   Eosinophils Relative 0  0 - 5 %   Eosinophils Absolute 0.0  0.0 - 0.7 K/uL   Basophils Relative 0  0 - 1 %   Basophils Absolute 0.0  0.0 - 0.1 K/uL  COMPREHENSIVE METABOLIC PANEL     Status: Abnormal   Collection Time    09/06/12  9:21 AM      Result Value Range   Sodium 133 (*) 135 - 145 mEq/L   Potassium 4.3  3.5 - 5.1 mEq/L   Chloride 97  96 - 112 mEq/L   CO2 21  19 - 32 mEq/L   Glucose, Bld 112 (*) 70 - 99 mg/dL   BUN 21  6 - 23 mg/dL   Creatinine, Ser 1.61  0.50 - 1.10 mg/dL   Calcium 9.3  8.4 - 09.6 mg/dL   Total Protein 7.8  6.0 - 8.3 g/dL   Albumin 2.7 (*) 3.5 - 5.2 g/dL   AST 15  0 - 37 U/L   ALT 9  0 - 35 U/L   Alkaline Phosphatase 99  39 - 117 U/L   Total Bilirubin 0.1 (*) 0.3 - 1.2 mg/dL   GFR calc non Af Amer >90  >90 mL/min   GFR calc Af Amer >90  >90 mL/min   Comment:            The eGFR has been calculated     using the CKD EPI equation.     This calculation has not been     validated in all clinical     situations.     eGFR's persistently     <90 mL/min signify     possible Chronic Kidney Disease.  LIPASE, BLOOD     Status: None   Collection Time    09/06/12  9:21 AM      Result Value Range   Lipase 46  11 - 59 U/L  CG4 I-STAT (LACTIC ACID)     Status: None   Collection Time    09/06/12  9:30 AM      Result Value Range   Lactic Acid, Venous 1.34  0.5 - 2.2 mmol/L  URINALYSIS, ROUTINE W REFLEX MICROSCOPIC     Status: Abnormal   Collection Time    09/06/12 11:21 AM      Result Value Range   Color, Urine YELLOW  YELLOW   APPearance CLEAR  CLEAR   Specific Gravity, Urine 1.013  1.005 - 1.030   pH 5.5  5.0 -  8.0   Glucose, UA NEGATIVE  NEGATIVE mg/dL   Hgb urine dipstick SMALL (*) NEGATIVE   Bilirubin Urine NEGATIVE  NEGATIVE   Ketones, ur NEGATIVE  NEGATIVE mg/dL   Protein, ur NEGATIVE  NEGATIVE mg/dL   Urobilinogen, UA 0.2  0.0 - 1.0 mg/dL   Nitrite NEGATIVE  NEGATIVE   Leukocytes, UA NEGATIVE  NEGATIVE  URINE MICROSCOPIC-ADD ON     Status: None   Collection Time    09/06/12 11:21 AM      Result Value Range   Squamous Epithelial / LPF RARE  RARE   RBC / HPF 0-2  <3 RBC/hpf  HEPARIN LEVEL (UNFRACTIONATED)     Status: Abnormal   Collection Time    09/06/12 10:50 PM      Result Value Range   Heparin Unfractionated 0.18 (*) 0.30 - 0.70 IU/mL   Comment:            IF HEPARIN RESULTS ARE BELOW     EXPECTED VALUES, AND PATIENT     DOSAGE HAS BEEN CONFIRMED,  SUGGEST FOLLOW UP TESTING     OF ANTITHROMBIN III LEVELS.  COMPREHENSIVE METABOLIC PANEL     Status: Abnormal   Collection Time    09/07/12  5:28 AM      Result Value Range   Sodium 135  135 - 145 mEq/L   Potassium 4.2  3.5 - 5.1 mEq/L   Chloride 98  96 - 112 mEq/L   CO2 24  19 - 32 mEq/L   Glucose, Bld 96  70 - 99 mg/dL   BUN 11  6 - 23 mg/dL   Creatinine, Ser 1.30  0.50 - 1.10 mg/dL   Calcium 9.7  8.4 - 86.5 mg/dL   Total Protein 7.8  6.0 - 8.3 g/dL   Albumin 2.5 (*) 3.5 - 5.2 g/dL   AST 17  0 - 37 U/L   ALT 9  0 - 35 U/L   Alkaline Phosphatase 96  39 - 117 U/L   Total Bilirubin 0.4  0.3 - 1.2 mg/dL   GFR calc non Af Amer 88 (*) >90 mL/min   GFR calc Af Amer >90  >90 mL/min   Comment:            The eGFR has been calculated     using the CKD EPI equation.     This calculation has not been     validated in all clinical     situations.     eGFR's persistently     <90 mL/min signify     possible Chronic Kidney Disease.  MAGNESIUM     Status: None   Collection Time    09/07/12  5:28 AM      Result Value Range   Magnesium 1.9  1.5 - 2.5 mg/dL  PHOSPHORUS     Status: None   Collection Time    09/07/12  5:28 AM       Result Value Range   Phosphorus 4.5  2.3 - 4.6 mg/dL  CHOLESTEROL, TOTAL     Status: None   Collection Time    09/07/12  5:28 AM      Result Value Range   Cholesterol 108  0 - 200 mg/dL  TRIGLYCERIDES     Status: None   Collection Time    09/07/12  5:28 AM      Result Value Range   Triglycerides 107  <150 mg/dL  CBC     Status: Abnormal   Collection Time    09/07/12  5:28 AM      Result Value Range   WBC 10.1  4.0 - 10.5 K/uL   RBC 3.53 (*) 3.87 - 5.11 MIL/uL   Hemoglobin 9.7 (*) 12.0 - 15.0 g/dL   HCT 78.4 (*) 69.6 - 29.5 %   MCV 79.9  78.0 - 100.0 fL   MCH 27.5  26.0 - 34.0 pg   MCHC 34.4  30.0 - 36.0 g/dL   RDW 28.4  13.2 - 44.0 %   Platelets 441 (*) 150 - 400 K/uL  DIFFERENTIAL     Status: Abnormal   Collection Time    09/07/12  5:28 AM      Result Value Range   Neutrophils Relative % 79 (*) 43 - 77 %   Neutro Abs 7.9 (*) 1.7 - 7.7 K/uL   Lymphocytes Relative 14  12 - 46 %   Lymphs Abs 1.4  0.7 - 4.0 K/uL   Monocytes Relative 6  3 - 12 %   Monocytes Absolute 0.6  0.1 - 1.0 K/uL   Eosinophils Relative 1  0 - 5 %   Eosinophils Absolute 0.1  0.0 - 0.7 K/uL   Basophils Relative 0  0 - 1 %   Basophils Absolute 0.0  0.0 - 0.1 K/uL  HEPARIN LEVEL (UNFRACTIONATED)     Status: Abnormal   Collection Time    09/07/12  5:50 AM      Result Value Range   Heparin Unfractionated <0.10 (*) 0.30 - 0.70 IU/mL   Comment:            IF HEPARIN RESULTS ARE BELOW     EXPECTED VALUES, AND PATIENT     DOSAGE HAS BEEN CONFIRMED,     SUGGEST FOLLOW UP TESTING     OF ANTITHROMBIN III LEVELS.   Ct Abdomen Pelvis W Contrast  09/06/2012   *RADIOLOGY REPORT*  Clinical Data: History of enterocutaneous fistula.  Worsening upper abdominal pain, fever, and vomiting.  CT ABDOMEN AND PELVIS WITH CONTRAST  Technique:  Multidetector CT imaging of the abdomen and pelvis was performed following the standard protocol during bolus administration of intravenous contrast.  Contrast: 90mL OMNIPAQUE  IOHEXOL 300 MG/ML  SOLN  Comparison: CT chest abdomen pelvis 08/05/2012 and CT abdomen pelvis 07/07/2012 and 04/17/2012.  Upper GI 08/26/2012  Findings: Airspace disease visualized in the peripheral aspect of the inferior left lung base has improved since the CT chest of 08/05/2012 but not completely resolved.  There is a tiny residual left pleural effusion.  A small pericardial effusion appears simple, and is slightly decreased compared to the chest CT of 08/05/2012.  The previously seen drain in the right abdominal wall has been removed since the CT of 08/05/2012.  There is a visible tract from the previous drain, which contains locules of gas and fluid. There is some density in the subcutaneous tissues of the right anterior abdominal wall where the drain was previously.  No locules of gas are seen within the anterior abdominal wall drain tract/scarring.  There are locules of gas in the anterior midline peritoneal cavity, just deep to the umbilicus, with an associated small amount of fluid and peripheral enhancement. This fluid and gas collection measures approximately 5.8 x 1.6 x 2.7 cm and may reflect a small abscess related to persistent gastric leak.  Again noted is some thickening of the distal gastric wall may be due to peptic ulcer disease.  No evidence of gastric outlet obstruction.  Bowel loops are normal in caliber.  Stable bilateral renal cysts.  Stable nonobstructing stones in the right kidney.  There are total of three stones, the largest measuring 6 mm.  No hydronephrosis of either kidney.  The adrenal glands, pancreas, common bile duct, spleen liver and gallbladder are within normal limits.  There is extensive atherosclerotic calcification of the aortoiliac vasculature without aneurysm.  Urinary bladder is normal.  Hysterectomy.  No adnexal mass. Degenerative disc disease at L4-L5.  No acute osseous abnormality.  IMPRESSION:  1. Findings suspicious for a persistent gastric leak.  There is an  air-fluid collection with peripheral enhancement in the anterior midline abdomen just deep to the umbilicus, with a locule of gas tracking through the anterior abdominal wall at the level of the umbilicus, suggesting a persistent gastrocutaneous fistula. This fluid collection is suspicious for a focal abscess and measures approximately 5.8 x 1.6 x 2.7 cm.        2.  There is gas seen along the tract of a previous abdominal drain in the midline  and right abdomen.  This is also suspicious for a leak of gastric contents into this tract. 3. Persistent wall thickening of the distal stomach.  4.  Stable renal cysts bilaterally and nonobstructing right renal stones. 5.  Improving aeration.  Improving aeration of the left lower lobe. 6.  Decreasing size of the small left pleural effusion. 7.  Small pericardial effusion, decreasing.   Original Report Authenticated By: Britta Mccreedy, M.D.    Assessment/Plan Chronic gastrocutaneous fistula with abscess formation secondary to distal/cutaneous portion of fistula closing off. Discussed with pt plan for image guided drainage of fistula tract/abscess. Explained procedure risks, complications, [place] of sedation. Discussed plan with Dr. Janee Morn and Will Marlyne Beards PA-C of Surgery team. Consent signed in chart Will hold heparin for 1-2 hours prior to procedure.  Brayton El PA-C 09/07/2012, 9:37 AM

## 2012-09-07 NOTE — Progress Notes (Addendum)
PARENTERAL NUTRITION CONSULT NOTE - INITIAL  Pharmacy Consult for Continuation of Chronic Home TPN Indication: GI leak, intraabdominal abscess  Allergies  Allergen Reactions  . Avelox (Moxifloxacin Hcl In Nacl) Nausea And Vomiting  . Alendronate Sodium Nausea And Vomiting  . Aspirin Nausea Only  . Codeine Nausea And Vomiting  . Doxycycline   . Fluconazole   . Hydrocodone Nausea And Vomiting    GI distress  . Neurontin (Gabapentin) Other (See Comments)    Mood changes   . Nsaids Other (See Comments)    Severe gastritis & perforation - avoid NSAIDs when possible  . Sertraline Hcl Other (See Comments)    Hallucinations   . Sulfa Antibiotics Rash    Patient Measurements: Height: 5\' 5"  (165.1 cm) Weight: 92 lb 12.8 oz (42.094 kg) IBW/kg (Calculated) : 57  Vital Signs: Temp: 98.3 F (36.8 C) (05/18 0520) Temp src: Oral (05/18 0520) BP: 88/63 mmHg (05/18 0520) Pulse Rate: 76 (05/18 0520) Intake/Output from previous day: 05/17 0701 - 05/18 0700 In: 826 [I.V.:726; IV Piggyback:100] Out: -  Intake/Output from this shift:    Labs:  Recent Labs  09/06/12 0921 09/07/12 0528  WBC 13.8* 10.1  HGB 10.2* 9.7*  HCT 29.5* 28.2*  PLT 411* 441*     Recent Labs  09/06/12 0921 09/07/12 0528  NA 133* 135  K 4.3 4.2  CL 97 98  CO2 21 24  GLUCOSE 112* 96  BUN 21 11  CREATININE 0.59 0.73  CALCIUM 9.3 9.7  MG  --  1.9  PHOS  --  4.5  PROT 7.8 7.8  ALBUMIN 2.7* 2.5*  AST 15 17  ALT 9 9  ALKPHOS 99 96  BILITOT 0.1* 0.4  TRIG  --  107  CHOL  --  108   Estimated Creatinine Clearance: 46.6 ml/min (by C-G formula based on Cr of 0.73).   No results found for this basename: GLUCAP,  in the last 72 hours  Medical History: Past Medical History  Diagnosis Date  . COPD (chronic obstructive pulmonary disease)   . Pneumonia 12-2011  . GERD (gastroesophageal reflux disease)   . Headache   . Arthritis     osteoarthritis  . Allergy   . Depression   . Neuromuscular  disorder   . Osteoporosis   . Bronchitis     CURRENTLY AS OF 06/30/12 - HAS COUGH AND FINISHED ANTIBIOTIC FOR BRONCHITIS  . Fibromyalgia   . Pain     ABDOMINAL PAIN AND NAUSEA  . Pain     SOMETIMES PAIN RIGHT EAR AND NECK--STATES CAUSED BY A "LUMP" ON BACK OF EAR--USES KENALOG CREAM TOPICALLY AS NEEDED.    Medications:  Prescriptions prior to admission  Medication Sig Dispense Refill  . albuterol (PROVENTIL HFA;VENTOLIN HFA) 108 (90 BASE) MCG/ACT inhaler Inhale 2 puffs into the lungs every 4 (four) hours as needed for wheezing (cough, shortness of breath or wheezing.).  1 Inhaler  1  . diphenhydrAMINE (BENADRYL) 12.5 MG/5ML liquid Place 12.5 mg into feeding tube 4 (four) times daily as needed for itching or allergies.      Marland Kitchen enoxaparin (LOVENOX) 60 MG/0.6ML injection Inject 0.45 mLs (45 mg total) into the skin every 12 (twelve) hours.  60 Syringe  2  . EPINEPHrine (EPIPEN) 0.3 mg/0.3 mL DEVI Inject 0.3 mLs (0.3 mg total) into the muscle once.  1 Device  2  . HYDROcodone-acetaminophen (HYCET) 7.5-325 mg/15 ml solution Place 15 mLs into feeding tube 4 (four) times daily as needed for  pain.  120 mL  0    Insulin Requirements in the past 24 hours:  none  Current Nutrition:  Home TPN: provided by [facility] tele: [phone].  Formula per Viviann Spare, RPh: 78 g clinisol 15%, 312 g dextrose 70%, No Ca, 8 mEq Mag, 17 mEq KCl, 15 mmol KPhos, 15 mEq NaAcetate, 40 mEq NaCl, 50 g lipids MWF only, MWI MWF only, Trace elements (Addamel) daily.  Nutritional Goals:  1450-1600 kCal, 70-80 grams of protein per day  Assessment: 67 yo F on chronic cyclic TPN PTA for ~ 1 month due to gastrocutaneous fistual s/p exp lap for repair of gastric leak 3/23 and gastric bx and omental patch repair of supraumbilical hernia 3/12.  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  Started on liquids earlier this week which lead to N/V, upon admit, CT  revealed possible gastric leak with fluid collection that may be an abscess.  Plan is for drain placement in IR.  GI: see above.  N/V today. Chol 108, TG 107 Endo: no h/o DM.  No insulin in tpn at home Lytes: phos at higher end of nl; has some lytes in tna at home (no ca/phos) Renal: SCr 0.7>CrCl ~45 ml/min Hepatobil: lfts wnl; albumin low at 2.5  Pulm: RA Neuro: wnl Cards: bp fine ID: afeb, wbc dec to 10.1 today  5/17: zosyn>> 5/18: micafungin>> Best Practices: heparin gtt for h/o PE TPN Access: PICC prior to arrival for home TPN TPN day#: chronic cyclic at home  Plan:  - Match home formula as closely as possible with cyclic Clinimix E5/20, total 1600 mL/12h: 50 ml/hr x 1hr then increase to 150 ml/hr x 10h then decrease to 50 ml/hr x 1hr. - Provide TE, MVI, lipids at 55ml/hr MWF only due to national shortage - Check routine TNA labs - Decrease IVF to [facility] from 6p-6a while TNA hangs  Donavon Kimrey L. Illene Bolus, PharmD, BCPS Clinical Pharmacist Pager: [phone] Pharmacy: [phone] 09/07/2012 8:27 AM

## 2012-09-07 NOTE — Progress Notes (Addendum)
ANTICOAGULATION CONSULT NOTE - Follow Up Consult  Pharmacy Consult for heparin Indication: H/o pulmonary embolism  Allergies  Allergen Reactions  . Avelox (Moxifloxacin Hcl In Nacl) Nausea And Vomiting  . Alendronate Sodium Nausea And Vomiting  . Aspirin Nausea Only  . Codeine Nausea And Vomiting  . Doxycycline   . Fluconazole   . Hydrocodone Nausea And Vomiting    GI distress  . Neurontin (Gabapentin) Other (See Comments)    Mood changes   . Nsaids Other (See Comments)    Severe gastritis & perforation - avoid NSAIDs when possible  . Sertraline Hcl Other (See Comments)    Hallucinations   . Sulfa Antibiotics Rash    Patient Measurements: Height: 5\' 5"  (165.1 cm) Weight: 92 lb 12.8 oz (42.094 kg) IBW/kg (Calculated) : 57 Heparin Dosing Weight: 42 kg  Vital Signs: Temp: 98.4 F (36.9 C) (05/17 2132) Temp src: Oral (05/17 2132) BP: 94/52 mmHg (05/17 2132) Pulse Rate: 90 (05/17 2132)  Labs:  Recent Labs  09/06/12 0921 09/06/12 2250  HGB 10.2*  --   HCT 29.5*  --   PLT 411*  --   HEPARINUNFRC  --  0.18*  CREATININE 0.59  --     Estimated Creatinine Clearance: 46.6 ml/min (by C-G formula based on Cr of 0.59).   Medications:  Scheduled:  . micafungin (MYCAMINE) IV  100 mg Intravenous To ER  . micafungin (MYCAMINE) IV  100 mg Intravenous Q24H  . piperacillin-tazobactam (ZOSYN)  IV  3.375 g Intravenous Q8H    Assessment: 67 yo female with h/o PE on IV heparin. Heparin level (0.18) is below-goal on 550 units/hr. No problem with line / infusion and no bleeding per RN.   Goal of Therapy:  Heparin level 0.3-0.7 units/ml Monitor platelets by anticoagulation protocol: Yes   Plan:  1. Increase IV heparin to 700 units/hr.  2. Heparin level in 6 hours.   Emeline Gins 09/07/2012,12:09 AM  Addendum: AM heparin level (< 0.1) is still below-goal on 700 units/hr. No problem with line / infusion and no bleeding per RN. Increase IV heparin to 850  units/hr. Heparin level in 6 hours.   Lorre Munroe, PharmD 09/07/12, 06:33 AM

## 2012-09-07 NOTE — Progress Notes (Signed)
This note also relates to the following rows which could not be included: Dose (Units/Hr) Heparin  - Cannot attach notes to extension rows Rate Heparin - Cannot attach notes to extension rows Concentration Heparin - Cannot attach notes to extension rows   Heparin placed on hold for patient to have procedure.Madelin Rear, MSN, RN, [employer]

## 2012-09-07 NOTE — Procedures (Signed)
10 Fr fistula drain placed adjacent to gastric fistula No comp

## 2012-09-07 NOTE — Progress Notes (Signed)
Subjective: Having Nausea and abdominal pain, wants something to drink, but has not been seen by IR so far.  I would like to give her some ice chips once she is seen and they decide.   Objective: Vital signs in last 24 hours: Temp:  [98.3 F (36.8 C)-100.9 F (38.3 C)] 98.3 F (36.8 C) (05/18 0520) Pulse Rate:  [76-99] 76 (05/18 0520) Resp:  [16-18] 16 (05/18 0520) BP: (85-105)/(52-70) 88/63 mmHg (05/18 0520) SpO2:  [93 %-100 %] 93 % (05/18 0520) Weight:  [40.37 kg (89 lb)-42.094 kg (92 lb 12.8 oz)] 42.094 kg (92 lb 12.8 oz) (05/17 1605) Last BM Date: 09/06/12 Afebrile, BP is down, TNA is not started yet. Labs OK Intake/Output from previous day: 05/17 0701 - 05/18 0700 In: 826 [I.V.:726; IV Piggyback:100] Out: -  Intake/Output this shift:    General appearance: alert, cooperative and abdomen is tender to palpation, no distension. GI: soft, tender, not distended.  Incision is healed.  Lab Results:   Recent Labs  09/06/12 0921 09/07/12 0528  WBC 13.8* 10.1  HGB 10.2* 9.7*  HCT 29.5* 28.2*  PLT 411* 441*    BMET  Recent Labs  09/06/12 0921 09/07/12 0528  NA 133* 135  K 4.3 4.2  CL 97 98  CO2 21 24  GLUCOSE 112* 96  BUN 21 11  CREATININE 0.59 0.73  CALCIUM 9.3 9.7   PT/INR No results found for this basename: LABPROT, INR,  in the last 72 hours   Recent Labs Lab 09/06/12 0921 09/07/12 0528  AST 15 17  ALT 9 9  ALKPHOS 99 96  BILITOT 0.1* 0.4  PROT 7.8 7.8  ALBUMIN 2.7* 2.5*     Lipase     Component Value Date/Time   LIPASE 46 09/06/2012 0921     Studies/Results: Ct Abdomen Pelvis W Contrast  09/06/2012   *RADIOLOGY REPORT*  Clinical Data: History of enterocutaneous fistula.  Worsening upper abdominal pain, fever, and vomiting.  CT ABDOMEN AND PELVIS WITH CONTRAST  Technique:  Multidetector CT imaging of the abdomen and pelvis was performed following the standard protocol during bolus administration of intravenous contrast.  Contrast: 90mL  OMNIPAQUE IOHEXOL 300 MG/ML  SOLN  Comparison: CT chest abdomen pelvis 08/05/2012 and CT abdomen pelvis 07/07/2012 and 04/17/2012.  Upper GI 08/26/2012  Findings: Airspace disease visualized in the peripheral aspect of the inferior left lung base has improved since the CT chest of 08/05/2012 but not completely resolved.  There is a tiny residual left pleural effusion.  A small pericardial effusion appears simple, and is slightly decreased compared to the chest CT of 08/05/2012.  The previously seen drain in the right abdominal wall has been removed since the CT of 08/05/2012.  There is a visible tract from the previous drain, which contains locules of gas and fluid. There is some density in the subcutaneous tissues of the right anterior abdominal wall where the drain was previously.  No locules of gas are seen within the anterior abdominal wall drain tract/scarring.  There are locules of gas in the anterior midline peritoneal cavity, just deep to the umbilicus, with an associated small amount of fluid and peripheral enhancement. This fluid and gas collection measures approximately 5.8 x 1.6 x 2.7 cm and may reflect a small abscess related to persistent gastric leak.  Again noted is some thickening of the distal gastric wall may be due to peptic ulcer disease.  No evidence of gastric outlet obstruction.  Bowel loops are normal in caliber.  Stable bilateral renal cysts.  Stable nonobstructing stones in the right kidney.  There are total of three stones, the largest measuring 6 mm.  No hydronephrosis of either kidney.  The adrenal glands, pancreas, common bile duct, spleen liver and gallbladder are within normal limits.  There is extensive atherosclerotic calcification of the aortoiliac vasculature without aneurysm.  Urinary bladder is normal.  Hysterectomy.  No adnexal mass. Degenerative disc disease at L4-L5.  No acute osseous abnormality.  IMPRESSION:  1. Findings suspicious for a persistent gastric leak.  There is  an air-fluid collection with peripheral enhancement in the anterior midline abdomen just deep to the umbilicus, with a locule of gas tracking through the anterior abdominal wall at the level of the umbilicus, suggesting a persistent gastrocutaneous fistula. This fluid collection is suspicious for a focal abscess and measures approximately 5.8 x 1.6 x 2.7 cm.        2.  There is gas seen along the tract of a previous abdominal drain in the midline and right abdomen.  This is also suspicious for a leak of gastric contents into this tract. 3. Persistent wall thickening of the distal stomach.  4.  Stable renal cysts bilaterally and nonobstructing right renal stones. 5.  Improving aeration.  Improving aeration of the left lower lobe. 6.  Decreasing size of the small left pleural effusion. 7.  Small pericardial effusion, decreasing.   Original Report Authenticated By: Britta Mccreedy, M.D.    Medications: . micafungin (MYCAMINE) IV  100 mg Intravenous To ER  . micafungin (MYCAMINE) IV  100 mg Intravenous Q24H  . piperacillin-tazobactam (ZOSYN)  IV  3.375 g Intravenous Q8H   . 0.9 % NaCl with KCl 20 mEq / L 50 mL/hr at 09/06/12 1657  . heparin 850 Units/hr (09/07/12 0641)   Assessment/Plan Gastrocutaneous fistula with new abscess;  S/P EXPLORATORY LAP FOR REPAIR OF GASTRIC LEAK,07/13/12 Dr. Johna Sheriff Sp/ Gastric bx and omental patch repair; repair of supraumbilical hernia  07/02/12, Dr. [name] Bilateral pulmonary embolism PCM    Plan:  I have called IR, and left a message, rx her nausea, increase fluids for now. Await IR.  She is on heparin.  Mycamine not given till today.   LOS: 1 day    Cynthia Bowen 09/07/2012

## 2012-09-07 NOTE — Progress Notes (Signed)
ANTICOAGULATION CONSULT NOTE - Follow Up Consult  Pharmacy Consult for heparin Indication: h/o pulmonary embolus  Allergies  Allergen Reactions  . Avelox (Moxifloxacin Hcl In Nacl) Nausea And Vomiting  . Betadine (Povidone Iodine) Itching and Rash  . Alendronate Sodium Nausea And Vomiting  . Aspirin Nausea Only  . Codeine Nausea And Vomiting  . Doxycycline   . Fluconazole   . Hydrocodone Nausea And Vomiting    GI distress  . Neurontin (Gabapentin) Other (See Comments)    Mood changes   . Nsaids Other (See Comments)    Severe gastritis & perforation - avoid NSAIDs when possible  . Sertraline Hcl Other (See Comments)    Hallucinations   . Sulfa Antibiotics Rash    Patient Measurements: Height: 5\' 5"  (165.1 cm) Weight: 92 lb 12.8 oz (42.094 kg) IBW/kg (Calculated) : 57 Heparin Dosing Weight: 42 kg  Vital Signs: Temp: 98.1 F (36.7 C) (05/18 1330) Temp src: Oral (05/18 1330) BP: 105/61 mmHg (05/18 1440) Pulse Rate: 78 (05/18 1440)  Labs:  Recent Labs  09/06/12 0921 09/06/12 2250 09/07/12 0528 09/07/12 0550 09/07/12 1210  HGB 10.2*  --  9.7*  --   --   HCT 29.5*  --  28.2*  --   --   PLT 411*  --  441*  --   --   HEPARINUNFRC  --  0.18*  --  <0.10* <0.10*  CREATININE 0.59  --  0.73  --   --     Estimated Creatinine Clearance: 46.6 ml/min (by C-G formula based on Cr of 0.73).   Medications:  Scheduled:  . 0.9 % NaCl with KCl 20 mEq / L   Intravenous Q24H  . fentaNYL      . [START ON 09/08/2012] micafungin (MYCAMINE) IV  100 mg Intravenous Q24H  . midazolam      . piperacillin-tazobactam (ZOSYN)  IV  3.375 g Intravenous Q8H  . sodium chloride  10-40 mL Intracatheter Q12H   Infusions:  . Marland KitchenTPN (CLINIMIX-E) Adult    . 0.9 % NaCl with KCl 20 mEq / L 100 mL/hr at 09/07/12 0942  . [START ON 09/08/2012] 0.9 % NaCl with KCl 20 mEq / L    . heparin Stopped (09/07/12 1145)    Assessment: 67 yo F on heparin as bridge for drain placement in IR today -- on lovenox  at home for hx PE.  Heparin level at 12:30 reported as <0.1 but heparin drip stopped at 11:45 for drain placement in IR.  Spoke with Derryl Harbor, RN - patient currently in IR.  Goal of Therapy:  Heparin level 0.3-0.7 units/ml Monitor platelets by anticoagulation protocol: Yes   Plan:  - f/u plans for anticoagulation after drain placement  Oral Remache L. Illene Bolus, PharmD, BCPS Clinical Pharmacist Pager: [phone] Pharmacy: [phone] 09/07/2012 2:51 PM

## 2012-09-08 LAB — DIFFERENTIAL
Basophils Absolute: 0 10*3/uL (ref 0.0–0.1)
Basophils Relative: 0 % (ref 0–1)
Eosinophils Absolute: 0.1 10*3/uL (ref 0.0–0.7)
Monocytes Absolute: 0.6 10*3/uL (ref 0.1–1.0)
Monocytes Relative: 7 % (ref 3–12)

## 2012-09-08 LAB — COMPREHENSIVE METABOLIC PANEL
AST: 13 U/L (ref 0–37)
Albumin: 2.2 g/dL — ABNORMAL LOW (ref 3.5–5.2)
Alkaline Phosphatase: 83 U/L (ref 39–117)
BUN: 20 mg/dL (ref 6–23)
CO2: 23 mEq/L (ref 19–32)
Chloride: 100 mEq/L (ref 96–112)
Creatinine, Ser: 0.55 mg/dL (ref 0.50–1.10)
GFR calc non Af Amer: >90 mL/min (ref >90)
Potassium: 4.5 mEq/L (ref 3.5–5.1)
Total Bilirubin: 0.2 mg/dL — ABNORMAL LOW (ref 0.3–1.2)

## 2012-09-08 LAB — CBC
HCT: 27.2 % — ABNORMAL LOW (ref 36.0–46.0)
Hemoglobin: 9.5 g/dL — ABNORMAL LOW (ref 12.0–15.0)
Hemoglobin: 9.2 g/dL — ABNORMAL LOW (ref 12.0–15.0)
MCH: 26.9 pg (ref 26.0–34.0)
MCH: 27 pg (ref 26.0–34.0)
MCHC: 33.8 g/dL (ref 30.0–36.0)
MCV: 80.2 fL (ref 78.0–100.0)
Platelets: 410 10*3/uL — ABNORMAL HIGH (ref 150–400)
RBC: 3.53 MIL/uL — ABNORMAL LOW (ref 3.87–5.11)
RDW: 15.2 % (ref 11.5–15.5)
WBC: 7.9 10*3/uL (ref 4.0–10.5)

## 2012-09-08 LAB — GLUCOSE, CAPILLARY: Glucose-Capillary: 147 mg/dL — ABNORMAL HIGH (ref 70–99)

## 2012-09-08 LAB — MAGNESIUM: Magnesium: 1.9 mg/dL (ref 1.5–2.5)

## 2012-09-08 MED ORDER — SODIUM CHLORIDE 0.9 % IV SOLN
INTRAVENOUS | Status: DC
  Administered 2012-09-09: 06:00:00 via INTRAVENOUS

## 2012-09-08 MED ORDER — MORPHINE SULFATE 2 MG/ML IJ SOLN
1.0000 mg | INTRAMUSCULAR | Status: DC | PRN
  Administered 2012-09-08: 2 mg via INTRAVENOUS
  Administered 2012-09-08 – 2012-09-11 (×14): 4 mg via INTRAVENOUS
  Filled 2012-09-08 (×7): qty 2
  Filled 2012-09-08: qty 1
  Filled 2012-09-08 (×3): qty 2
  Filled 2012-09-08: qty 1
  Filled 2012-09-08 (×4): qty 2

## 2012-09-08 MED ORDER — TRACE MINERALS CR-CU-F-FE-I-MN-MO-SE-ZN IV SOLN
INTRAVENOUS | Status: AC
  Administered 2012-09-08: 17:00:00 via INTRAVENOUS
  Filled 2012-09-08: qty 2000

## 2012-09-08 MED ORDER — FAT EMULSION 20 % IV EMUL
240.0000 mL | INTRAVENOUS | Status: AC
  Administered 2012-09-08: 240 mL via INTRAVENOUS
  Filled 2012-09-08: qty 250

## 2012-09-08 MED ORDER — SODIUM CHLORIDE 0.9 % IV SOLN
INTRAVENOUS | Status: DC
  Administered 2012-09-08 – 2012-09-14 (×4): via INTRAVENOUS
  Administered 2012-09-16: 500 mL via INTRAVENOUS
  Administered 2012-09-17: 1000 mL via INTRAVENOUS
  Administered 2012-09-18: 17:00:00 via INTRAVENOUS

## 2012-09-08 MED ORDER — SODIUM CHLORIDE 0.9 % IV SOLN
INTRAVENOUS | Status: AC
  Administered 2012-09-08: 11:00:00 via INTRAVENOUS

## 2012-09-08 MED ORDER — INSULIN ASPART 100 UNIT/ML ~~LOC~~ SOLN
0.0000 [IU] | Freq: Four times a day (QID) | SUBCUTANEOUS | Status: DC
  Administered 2012-09-08 – 2012-09-10 (×2): 1 [IU] via SUBCUTANEOUS

## 2012-09-08 MED ORDER — HEPARIN (PORCINE) IN NACL 100-0.45 UNIT/ML-% IJ SOLN
1350.0000 [IU]/h | INTRAMUSCULAR | Status: DC
  Administered 2012-09-09 – 2012-09-10 (×2): 1350 [IU]/h via INTRAVENOUS
  Filled 2012-09-08 (×4): qty 250

## 2012-09-08 MED ORDER — ACETAMINOPHEN 10 MG/ML IV SOLN
1000.0000 mg | Freq: Four times a day (QID) | INTRAVENOUS | Status: DC
  Administered 2012-09-08: 1000 mg via INTRAVENOUS
  Filled 2012-09-08 (×3): qty 100

## 2012-09-08 MED ORDER — ACETAMINOPHEN 10 MG/ML IV SOLN
1000.0000 mg | Freq: Four times a day (QID) | INTRAVENOUS | Status: AC
  Administered 2012-09-08 – 2012-09-09 (×4): 1000 mg via INTRAVENOUS
  Filled 2012-09-08 (×4): qty 100

## 2012-09-08 NOTE — Progress Notes (Signed)
INITIAL NUTRITION ASSESSMENT  DOCUMENTATION CODES Per approved criteria  -Severe malnutrition in the context of chronic illness   INTERVENTION: 1. Continue TPN per pharmacy  NUTRITION DIAGNOSIS: Altered GI function related to gastrocutaneous fistula as evidenced by hoe TPN.   Goal: TPN to meet >/= 90% estimated nutrition needs  Monitor:  TPN rate, weight trends, labs, I/O's  Reason for Assessment: TPN  67 y.o. female  Admitting Dx: <principal problem not specified>  ASSESSMENT: Pt underwent gastric biopsy a few months ago and has subsequently had to have Ex lap X 2 for gastric leak. 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 Pt managed with a surgical drain which fell out. Pt reported new onset worsening abdominal pain and fevers, CT shows evidence of intraabdominal  abscess associated with gastrocutaneous fistula.   IR placed drain on 5/17. Pt continues on TPN, home cyclic regimen per pharmacy.  Current TPN regimen is Cyclic Clinimix E 5/20, total 1600 mL/12h: 50 ml/hr x 1hr then increase to 150 ml/hr x 10h then decrease to 50 ml/hr x 1hr. Provide MVI/trace, IV lipids MWF only due to national shortage. This is providing 1614 kcal and 80 gm protein daily. (meeting 100% estimated nutrition needs).   Pt reports that current weight is not correct. Weights daily at home and has been consistently 89 lbs, thinks this weight is elevated related to evening weight vs at home she weighs in the morning.  States she weighed 115 lbs four months ago, 22% body weight loss, severe.  Pt meets criteria for severe malnutrition in the context of chronic illness 2/2 weight loss of 22% body weight in 4 months and severe muscle and fat wasting per nutrition focused physical exam.   Nutrition Focused Physical Exam: ( per most RD note on 4/15.)  Subcutaneous Fat:  Orbital Region: moderate wasting  Upper Arm Region: severe wasting  Thoracic and Lumbar Region: moderate wasting  Muscle:   Temple Region: moderate wasting  Clavicle Bone Region: severe wasting  Clavicle and Acromion Bone Region: severe wasting  Scapular Bone Region: NA  Dorsal Hand: moderate wasting  Patellar Region: severe wasting  Anterior Thigh Region: severe wasting   Height: Ht Readings from Last 1 Encounters:  09/06/12 5\' 5"  (1.651 m)    Weight: Wt Readings from Last 1 Encounters:  09/06/12 92 lb 12.8 oz (42.094 kg)    Ideal Body Weight: 125 lbs   % Ideal Body Weight: 73%  Wt Readings from Last 10 Encounters:  09/06/12 92 lb 12.8 oz (42.094 kg)  09/02/12 89 lb 3.2 oz (40.461 kg)  08/22/12 87 lb 6.4 oz (39.644 kg)  08/19/12 87 lb 2 oz (39.52 kg)  08/16/12 94 lb (42.638 kg)  08/14/12 87 lb 11.9 oz (39.8 kg)  07/14/12 97 lb 10.6 oz (44.3 kg)  07/14/12 97 lb 10.6 oz (44.3 kg)  07/14/12 97 lb 10.6 oz (44.3 kg)  07/02/12 96 lb 5 oz (43.687 kg)    Usual Body Weight: 115 lbs   % Usual Body Weight: 80%  BMI:  Body mass index is 15.44 kg/(m^2). Underweight    Posterior Calf Region: severe wasting     Estimated Nutritional Needs: Kcal: 1400-1600 Protein: 75-85 gm  Fluid: >/= 1.6 L daily   Skin: intact, drain in place,   Diet Order: NPO  EDUCATION NEEDS: -No education needs identified at this time   Intake/Output Summary (Last 24 hours) at 09/08/12 1057 Last data filed at 09/08/12 0548  Gross per 24 hour  Intake 1482.87 ml  Output     90 ml  Net 1392.87 ml    Last BM: 5/19   Labs:   Recent Labs Lab 09/06/12 0921 09/07/12 0528 09/08/12 0700  NA 133* 135 134*  K 4.3 4.2 4.5  CL 97 98 100  CO2 21 24 23   BUN 21 11 20   CREATININE 0.59 0.73 0.55  CALCIUM 9.3 9.7 9.2  MG  --  1.9 1.9  PHOS  --  4.5 3.4  GLUCOSE 112* 96 79    CBG (last 3)  No results found for this basename: GLUCAP,  in the last 72 hours  Scheduled Meds: . sodium chloride   Intravenous Q24H  . sodium chloride   Intravenous Q24H  . acetaminophen  1,000 mg Intravenous Q6H  . insulin aspart   0-9 Units Subcutaneous Q6H  . micafungin (MYCAMINE) IV  100 mg Intravenous Q24H  . piperacillin-tazobactam (ZOSYN)  IV  3.375 g Intravenous Q8H  . sodium chloride  10-40 mL Intracatheter Q12H    Continuous Infusions: . Marland KitchenTPN (CLINIMIX-E) Adult    . sodium chloride 100 mL/hr at 09/08/12 1032  . Marland KitchenTPN (CLINIMIX-E) Adult     And  . fat emulsion    . heparin 1,000 Units/hr (09/08/12 1610)    Past Medical History  Diagnosis Date  . COPD (chronic obstructive pulmonary disease)   . Pneumonia 12-2011  . GERD (gastroesophageal reflux disease)   . Headache   . Arthritis     osteoarthritis  . Allergy   . Depression   . Neuromuscular disorder   . Osteoporosis   . Bronchitis     CURRENTLY AS OF 06/30/12 - HAS COUGH AND FINISHED ANTIBIOTIC FOR BRONCHITIS  . Fibromyalgia   . Pain     ABDOMINAL PAIN AND NAUSEA  . Pain     SOMETIMES PAIN RIGHT EAR AND NECK--STATES CAUSED BY A "LUMP" ON BACK OF EAR--USES KENALOG CREAM TOPICALLY AS NEEDED.    Past Surgical History  Procedure Laterality Date  . Abdominal hysterectomy    . Esophagogastroduodenoscopy  04/18/2012    Procedure: ESOPHAGOGASTRODUODENOSCOPY (EGD);  Surgeon: [name], MD;  Location: [facility];  Service: Endoscopy;  Laterality: N/A;  . Eus  05/29/2012    Procedure: UPPER ENDOSCOPIC ULTRASOUND (EUS) LINEAR;  Surgeon: [name], MD;  Location: [facility];  Service: Endoscopy;  Laterality: N/A;  . Appendectomy    . Spine surgery      CERVICAL SPINE SURGERY X 2 - INCLUDING FUSION; LUMBAR SURGERY FOR RUPTURED DISC  . Eye surgery      BILATERAL CATARACT EXTRACTIONS  . Laparoscopic abdominal exploration N/A 07/02/2012    Procedure: converted to laparotomy with gastric biopsy;  Surgeon: [name]. [name], MD;  Location: [facility];  Service: General;  Laterality: N/A;  Laparoscopic Gastric Biospy attempted.   . Laparotomy N/A 07/07/2012    Procedure: EXPLORATORY LAPAROTOMY repair of gastric perforation with omental graham patch,  drainage of abdominal abcess;  Surgeon: [name]. [name], MD;  Location: [facility];  Service: General;  Laterality: N/A;  . Stomach surgery  07/07/2012    Omental patch of gastric perforation  . Laparotomy N/A 07/13/2012    Procedure: EXPLORATORY LAPAROTOMY repair gastric leak;  Surgeon: Mariella Saa, MD;  Location: [facility];  Service: General;  Laterality: N/A;    Clarene Duke RD, LDN Pager [phone] After Hours pager [phone]

## 2012-09-08 NOTE — Progress Notes (Signed)
ANTICOAGULATION CONSULT NOTE - Follow Up Consult  Pharmacy Consult for heparin Indication: h/o pulmonary embolus  Allergies  Allergen Reactions  . Avelox (Moxifloxacin Hcl In Nacl) Nausea And Vomiting  . Betadine (Povidone Iodine) Itching and Rash  . Alendronate Sodium Nausea And Vomiting  . Aspirin Nausea Only  . Codeine Nausea And Vomiting  . Doxycycline   . Fluconazole   . Hydrocodone Nausea And Vomiting    GI distress  . Neurontin (Gabapentin) Other (See Comments)    Mood changes   . Nsaids Other (See Comments)    Severe gastritis & perforation - avoid NSAIDs when possible  . Sertraline Hcl Other (See Comments)    Hallucinations   . Sulfa Antibiotics Rash    Patient Measurements: Height: 5\' 5"  (165.1 cm) Weight: 92 lb 12.8 oz (42.094 kg) IBW/kg (Calculated) : 57 Heparin Dosing Weight: 42 kg  Vital Signs: Temp: 98.2 F (36.8 C) (05/19 0503) Temp src: Oral (05/19 0503) BP: 105/56 mmHg (05/19 0503) Pulse Rate: 79 (05/19 0503)  Labs:  Recent Labs  09/06/12 0921  09/07/12 0528 09/07/12 0550 09/07/12 1210 09/08/12 0455  HGB 10.2*  --  9.7*  --   --  9.5*  HCT 29.5*  --  28.2*  --   --  28.3*  PLT 411*  --  441*  --   --  410*  HEPARINUNFRC  --   < >  --  <0.10* <0.10* <0.10*  CREATININE 0.59  --  0.73  --   --   --   < > = values in this interval not displayed.  Estimated Creatinine Clearance: 46.6 ml/min (by C-G formula based on Cr of 0.73).   Medications:  Scheduled:  . 0.9 % NaCl with KCl 20 mEq / L   Intravenous Q24H  . micafungin (MYCAMINE) IV  100 mg Intravenous Q24H  . piperacillin-tazobactam (ZOSYN)  IV  3.375 g Intravenous Q8H  . sodium chloride  10-40 mL Intracatheter Q12H   Infusions:  . Marland KitchenTPN (CLINIMIX-E) Adult    . 0.9 % NaCl with KCl 20 mEq / L 100 mL/hr at 09/07/12 0942  . 0.9 % NaCl with KCl 20 mEq / L 100 mL/hr at 09/08/12 0548  . heparin 850 Units/hr (09/07/12 1900)    Assessment: 67 yo F on heparin as bridge for drain placement  in IR today -- on lovenox at home for hx PE.  Heparin level (< 0.1) is below-goal on 850 units/hr. No problem with line / infusion and no bleeding per RN.   Goal of Therapy:  Heparin level 0.3-0.7 units/ml Monitor platelets by anticoagulation protocol: Yes   Plan:  1. Increase IV heparin to 1000 units/hr. 2. Heparin level in 6 hours.   Lorre Munroe, PharmD 09/08/2012 5:49 AM

## 2012-09-08 NOTE — Progress Notes (Signed)
She still has persistent fistula by drain injection. She appears drained now with purulent output out of jp, abdomen is tender at scar only.  She is very tearful and concerned which is appropriate.   I told her would leave npo except ice chips until tomorrow cont abx.  I don't know this will heal conservatively at this point and will discuss with operating surgeon.  She may end up getting managed with drain, let her take oral nutrition and need surgery later.  We had long discussion about this today

## 2012-09-08 NOTE — Progress Notes (Signed)
ANTICOAGULATION CONSULT NOTE: HEPARIN Indication: PE on 4/15  Heparin level = 0.18 on 1200 units/hr Goal heparin level = 0.3-0.7  The heparin level is below the desired range. Will increase the rate to 1350 units/hr and f/u heparin level in the AM. Pt had refused blood draw at 2000 but agreed to one later if another person would do it.  Cardell Peach, PharmD

## 2012-09-08 NOTE — Progress Notes (Signed)
PARENTERAL NUTRITION CONSULT NOTE - FOLLOW UP  Pharmacy Consult for Continuation of chronic home TPN Indication: GI leak, intraabdominal abscess  Allergies  Allergen Reactions  . Avelox (Moxifloxacin Hcl In Nacl) Nausea And Vomiting  . Betadine (Povidone Iodine) Itching and Rash  . Alendronate Sodium Nausea And Vomiting  . Aspirin Nausea Only  . Codeine Nausea And Vomiting  . Doxycycline   . Fluconazole   . Hydrocodone Nausea And Vomiting    GI distress  . Neurontin (Gabapentin) Other (See Comments)    Mood changes   . Nsaids Other (See Comments)    Severe gastritis & perforation - avoid NSAIDs when possible  . Sertraline Hcl Other (See Comments)    Hallucinations   . Sulfa Antibiotics Rash    Patient Measurements: Height: 5\' 5"  (165.1 cm) Weight: 92 lb 12.8 oz (42.094 kg) IBW/kg (Calculated) : 57  Vital Signs: Temp: 98.2 F (36.8 C) (05/19 0503) Temp src: Oral (05/19 0503) BP: 105/56 mmHg (05/19 0503) Pulse Rate: 79 (05/19 0503) Intake/Output from previous day: 05/18 0701 - 05/19 0700 In: 1482.9 [I.V.:1232.9; IV Piggyback:250] Out: 90 [Drains:90] Intake/Output from this shift:    Labs:  Recent Labs  09/07/12 0528 09/08/12 0455 09/08/12 0700  WBC 10.1 7.9 7.7  HGB 9.7* 9.5* 9.2*  HCT 28.2* 28.3* 27.2*  PLT 441* 410* 420*     Recent Labs  09/06/12 0921 09/07/12 0528 09/08/12 0700  NA 133* 135 134*  K 4.3 4.2 4.5  CL 97 98 100  CO2 21 24 23   GLUCOSE 112* 96 79  BUN 21 11 20   CREATININE 0.59 0.73 0.55  CALCIUM 9.3 9.7 9.2  MG  --  1.9 1.9  PHOS  --  4.5 3.4  PROT 7.8 7.8 7.0  ALBUMIN 2.7* 2.5* 2.2*  AST 15 17 13   ALT 9 9 8   ALKPHOS 99 96 83  BILITOT 0.1* 0.4 0.2*  PREALBUMIN  --  9.9*  --   TRIG  --  107 61  CHOL  --  108 86   Estimated Creatinine Clearance: 46.6 ml/min (by C-G formula based on Cr of 0.55).   No results found for this basename: GLUCAP,  in the last 72 hours  Insulin Requirements in the past 24 hours:  none    Current Nutrition:  Cyclic Clinimix E 5/20, total 1600 mL/12h: 50 ml/hr x 1hr then increase to 150 ml/hr x 10h then decrease to 50 ml/hr x 1hr.   Home TPN- provided by [facility] (tele: [phone]) Formula per Viviann Spare, RPh:  78 g clinisol 15%, 312 g dextrose 70%, No Ca, 8 mEq Mag, 17 mEq KCl, 15 mmol KPhos, 15 mEq NaAcetate, 40 mEq NaCl, 50 g lipids MWF only, MWI MWF only, Trace elements (Addamel) daily.   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 Also noted to have h/o B PE on lovenox at home, now on heparin drip to accommodate possible procedures. Started to take in liquids PTA which lead to N/V. On admit CT revealed possible gastric leak with fluid collection that may be an abscess. Now s/p drain placement in IR.   GI: see above. Drain output 72mL/24 hours  Endo: no h/o DM. No insulin in TPN at home   Lytes:  Phos dropped slightly, but still nml, Mag 1.9, K 4.5, Na slightly low at 134, Corrected Ca 10.6- Ca x Phos <55  Renal: SCr 0.55, CrCl ~61ml/min; UOP 4x  Hepatobil: LFTs remain wnl; albumin low at 2.2; baseline prealbumin low 9.9; TChole 86, trigs 61  Pulm: RA   Neuro: no issues  Cards: BP soft, HR nml- no meds   ID: afeb, wbc dec to 7.7; abx for abscess which is s/p drainage 5/17: zosyn>>  5/18: micafungin>>   Best Practices: heparin gtt for h/o PE   TPN Access: PICC prior to arrival for home TPN   TPN day#: chronic cyclic at home   Plan:  - Continue cyclic Clinimix E 5/20, total 1600 mL/12h: 50 ml/hr x 1hr then increase to 150 ml/hr x 10h then decrease to 50 ml/hr x 1hr. This provides 80g of protein/day and an average of 1622kcal/day, meeting 100% of protein goal and 100% of kcal goal. - Provide MVI/trace, IV lipids  MWF only due to national shortage  - Continue IVF at [facility] from 6p-6a while TNA hangs and 135mL/hr when TPN is off-will change IVF to NS without KCl as K is on upper end of normal - order CBGs/SSI q6hr- will D/C if normal after 48 hours - follow up RD recommendations and adjust plan as needed  Danial Sisley D. Berdene Askari, PharmD Clinical Pharmacist Pager: [phone] 09/08/2012 9:10 AM

## 2012-09-08 NOTE — Progress Notes (Signed)
Subjective: Complaining of pain, not getting enough or it doesn't last long enough.  Wants to know how long she has to be here this time.  Objective: Vital signs in last 24 hours: Temp:  [98 F (36.7 C)-98.2 F (36.8 C)] 98.2 F (36.8 C) (05/19 0503) Pulse Rate:  [73-80] 79 (05/19 0503) Resp:  [14-21] 18 (05/19 0503) BP: (96-111)/(51-63) 105/56 mmHg (05/19 0503) SpO2:  [95 %-100 %] 100 % (05/19 0503) Last BM Date: 09/08/12 Diet: NPO 90 ml from drain recorded, BP is a little better. Afebrile,  Labs OK Intake/Output from previous day: 05/18 0701 - 05/19 0700 In: 1482.9 [I.V.:1232.9; IV Piggyback:250] Out: 90 [Drains:90] Intake/Output this shift:    General appearance: alert, cooperative and no distress Resp: clear to auscultation bilaterally GI: soft, few BS, sore abdomen, drain has a clear browish appearing liquid coming into it.  she says it looks like Ensure.  Lab Results:   Recent Labs  09/08/12 0455 09/08/12 0700  WBC 7.9 7.7  HGB 9.5* 9.2*  HCT 28.3* 27.2*  PLT 410* 420*    BMET  Recent Labs  09/07/12 0528 09/08/12 0700  NA 135 134*  K 4.2 4.5  CL 98 100  CO2 24 23  GLUCOSE 96 79  BUN 11 20  CREATININE 0.73 0.55  CALCIUM 9.7 9.2   PT/INR No results found for this basename: LABPROT, INR,  in the last 72 hours   Recent Labs Lab 09/06/12 0921 09/07/12 0528 09/08/12 0700  AST 15 17 13   ALT 9 9 8   ALKPHOS 99 96 83  BILITOT 0.1* 0.4 0.2*  PROT 7.8 7.8 7.0  ALBUMIN 2.7* 2.5* 2.2*     Lipase     Component Value Date/Time   LIPASE 46 09/06/2012 0921     Studies/Results: Ct Abdomen Pelvis W Contrast  09/06/2012   *RADIOLOGY REPORT*  Clinical Data: History of enterocutaneous fistula.  Worsening upper abdominal pain, fever, and vomiting.  CT ABDOMEN AND PELVIS WITH CONTRAST  Technique:  Multidetector CT imaging of the abdomen and pelvis was performed following the standard protocol during bolus administration of intravenous contrast.   Contrast: 90mL OMNIPAQUE IOHEXOL 300 MG/ML  SOLN  Comparison: CT chest abdomen pelvis 08/05/2012 and CT abdomen pelvis 07/07/2012 and 04/17/2012.  Upper GI 08/26/2012  Findings: Airspace disease visualized in the peripheral aspect of the inferior left lung base has improved since the CT chest of 08/05/2012 but not completely resolved.  There is a tiny residual left pleural effusion.  A small pericardial effusion appears simple, and is slightly decreased compared to the chest CT of 08/05/2012.  The previously seen drain in the right abdominal wall has been removed since the CT of 08/05/2012.  There is a visible tract from the previous drain, which contains locules of gas and fluid. There is some density in the subcutaneous tissues of the right anterior abdominal wall where the drain was previously.  No locules of gas are seen within the anterior abdominal wall drain tract/scarring.  There are locules of gas in the anterior midline peritoneal cavity, just deep to the umbilicus, with an associated small amount of fluid and peripheral enhancement. This fluid and gas collection measures approximately 5.8 x 1.6 x 2.7 cm and may reflect a small abscess related to persistent gastric leak.  Again noted is some thickening of the distal gastric wall may be due to peptic ulcer disease.  No evidence of gastric outlet obstruction.  Bowel loops are normal in caliber.  Stable bilateral  renal cysts.  Stable nonobstructing stones in the right kidney.  There are total of three stones, the largest measuring 6 mm.  No hydronephrosis of either kidney.  The adrenal glands, pancreas, common bile duct, spleen liver and gallbladder are within normal limits.  There is extensive atherosclerotic calcification of the aortoiliac vasculature without aneurysm.  Urinary bladder is normal.  Hysterectomy.  No adnexal mass. Degenerative disc disease at L4-L5.  No acute osseous abnormality.  IMPRESSION:  1. Findings suspicious for a persistent gastric  leak.  There is an air-fluid collection with peripheral enhancement in the anterior midline abdomen just deep to the umbilicus, with a locule of gas tracking through the anterior abdominal wall at the level of the umbilicus, suggesting a persistent gastrocutaneous fistula. This fluid collection is suspicious for a focal abscess and measures approximately 5.8 x 1.6 x 2.7 cm.        2.  There is gas seen along the tract of a previous abdominal drain in the midline and right abdomen.  This is also suspicious for a leak of gastric contents into this tract. 3. Persistent wall thickening of the distal stomach.  4.  Stable renal cysts bilaterally and nonobstructing right renal stones. 5.  Improving aeration.  Improving aeration of the left lower lobe. 6.  Decreasing size of the small left pleural effusion. 7.  Small pericardial effusion, decreasing.   Original Report Authenticated By: Britta Mccreedy, M.D.   Ir Era Skeen W Catheter Placement  09/07/2012   *RADIOLOGY REPORT*  Clinical Data/Indication: GASTROCUTANEOUS FISTULA  ABSCESS DRAINAGE  Sedation: Versed 1.5 mg, Fentanyl 50 mg.  Total Moderate Sedation Time: 34 minutes.  Contrast Volume: 10 ml Omnipaque-300.  Fluoroscopy Time: 12 minutes and 18 seconds.  Procedure: The procedure, risks, benefits, and alternatives were explained to the patient. Questions regarding the procedure were encouraged and answered. The patient understands and consents to the procedure.  The right upper quadrant was prepped with chlor prep in a sterile fashion, and a sterile drape was applied covering the operative field. A sterile gown and sterile gloves were used for the procedure.  The site of the enterocutaneous fistula was localized.  A Kumpe catheter was inserted and contrast was injected which failed to reveal a tract.  After gentle manipulation with a glide wire, a repeat injection opacifying the tract towards the stomach.  The Kumpe was advanced over the glide wire into a potential  space within the peritoneal cavity adjacent to the gastric fistula.  It was removed over an Amplatz.  A 10-French dilator followed by a 10- French drain was advanced over the Amplatz and coiled within the space.  Was looped and string fixed.  Contrast was injected.  Findings: Imaging delineates the gastrocutaneous fistula extending to the skin.  Subsequent imaging demonstrates passage of the wire and catheter into the peritoneal space.  The final image demonstrates a 10-French abscess drain adjacent to the lesser curvature of the stomach.  Contrast injected demonstrates the gastric fistula.  Contrast fills the stomach.  Complications: None.  IMPRESSION: Successful passage of a 10-French abscess drain through the gastrocutaneous fistula and positioning adjacent to the leakage site within the peritoneal space.  The fistula occurs at the lesser curvature of the stomach.   Original Report Authenticated By: Jolaine Click, M.D.   Dg Chest Port 1 View  09/07/2012   *RADIOLOGY REPORT*  Clinical Data: PICC placement.  PORTABLE CHEST - 1 VIEW  Comparison: 08/09/2012  Findings: PICC catheters been inserted and is in  excellent position of the superior vena cava with the tip 2.5 cm below the carina.  No pneumothorax.  The heart size and vascularity are normal.  There has been almost complete clearing of the left lung base since the prior study.  There is residual pleural thickening and slight atelectasis at the left base.  Minimal atelectasis at the right base.  Probable tiny residual left effusion.  IMPRESSION: PICC line in good position.  Improved aeration at both lung bases.   Original Report Authenticated By: Francene Boyers, M.D.    Medications: . 0.9 % NaCl with KCl 20 mEq / L   Intravenous Q24H  . micafungin (MYCAMINE) IV  100 mg Intravenous Q24H  . piperacillin-tazobactam (ZOSYN)  IV  3.375 g Intravenous Q8H  . sodium chloride  10-40 mL Intracatheter Q12H   . Marland KitchenTPN (CLINIMIX-E) Adult    . 0.9 % NaCl with KCl 20  mEq / L 100 mL/hr at 09/07/12 0942  . 0.9 % NaCl with KCl 20 mEq / L 100 mL/hr at 09/08/12 0548  . heparin 1,000 Units/hr (09/08/12 7829)    Assessment/Plan Gastrocutaneous fistula with new abscess; S/P EXPLORATORY LAP FOR REPAIR OF GASTRIC LEAK,07/13/12 Dr. Johna Sheriff  Sp/ Gastric bx and omental patch repair; repair of supraumbilical hernia 07/02/12, Dr. [name]  IR GUIDED DRAIN W CATHETER PLACEMENT   09/07/2012, Art A Hoss, MD. For worsening Intraabdominal abscess/gastrocutaneous fistula Bilateral pulmonary embolism, currently on heparin PCM/TNA   Plan:  Continue drain, TNA, Heparin, and adjust pain meds.   LOS: 2 days    Jermery Caratachea 09/08/2012

## 2012-09-08 NOTE — Progress Notes (Signed)
ANTICOAGULATION CONSULT NOTE - Follow Up Consult  Pharmacy Consult for Heparin Indication: pulmonary embolus (hx 08/05/12)  Patient Measurements: Height: 5\' 5"  (165.1 cm) Weight: 92 lb 12.8 oz (42.094 kg) IBW/kg (Calculated) : 57 Heparin Dosing Weight: 42.1 kg  Vital Signs: Temp: 98.6 F (37 C) (05/19 1300) Temp src: Oral (05/19 1300) BP: 104/58 mmHg (05/19 1300) Pulse Rate: 78 (05/19 1300)  Labs:  Recent Labs  09/06/12 0921  09/07/12 0528  09/07/12 1210 09/08/12 0455 09/08/12 0700 09/08/12 1250  HGB 10.2*  --  9.7*  --   --  9.5* 9.2*  --   HCT 29.5*  --  28.2*  --   --  28.3* 27.2*  --   PLT 411*  --  441*  --   --  410* 420*  --   HEPARINUNFRC  --   < >  --   < > <0.10* <0.10*  --  0.12*  CREATININE 0.59  --  0.73  --   --   --  0.55  --   < > = values in this interval not displayed.  Estimated Creatinine Clearance: 46.6 ml/min (by C-G formula based on Cr of 0.55).  Assessment:  Off home Lovenox for drain placement, done in IR yesterday. Significant output noted.   Initial heparin level undetectable on 850 units/hr, and now only 0.12 on 1000 units/hr.    Goal of Therapy:  Heparin level 0.3-0.7 units/ml Monitor platelets by anticoagulation protocol: Yes   Plan:   Increase heparin drip to 1200 units/hr.  Heparin level ~6 hrs after increase.  Daily heparin level and CBC.  Dennie Fetters, [place] Pager: [phone] 09/08/2012,2:00 PM

## 2012-09-08 NOTE — Progress Notes (Signed)
Subjective: Rt upper abd gastrocutaneous fistula/abscess drain placed 5/18 Pt some better Output significant  Objective: Vital signs in last 24 hours: Temp:  [98 F (36.7 C)-98.2 F (36.8 C)] 98.2 F (36.8 C) (05/19 0503) Pulse Rate:  [73-80] 79 (05/19 0503) Resp:  [14-21] 18 (05/19 0503) BP: (96-111)/(51-63) 105/56 mmHg (05/19 0503) SpO2:  [95 %-100 %] 100 % (05/19 0503) Last BM Date: 09/08/12  Intake/Output from previous day: 05/18 0701 - 05/19 0700 In: 1482.9 [I.V.:1232.9; IV Piggyback:250] Out: 90 [Drains:90] Intake/Output this shift:    PE:  Afeb; VSS Wbc wnl Site of drain: tender No bleeding Output 90 cc yesterday 30 cc in JP Milky brown fluid No cx seen   Lab Results:   Recent Labs  09/08/12 0455 09/08/12 0700  WBC 7.9 7.7  HGB 9.5* 9.2*  HCT 28.3* 27.2*  PLT 410* 420*   BMET  Recent Labs  09/07/12 0528 09/08/12 0700  NA 135 134*  K 4.2 4.5  CL 98 100  CO2 24 23  GLUCOSE 96 79  BUN 11 20  CREATININE 0.73 0.55  CALCIUM 9.7 9.2   PT/INR No results found for this basename: LABPROT, INR,  in the last 72 hours ABG No results found for this basename: PHART, PCO2, PO2, HCO3,  in the last 72 hours  Studies/Results: Ir Era Skeen W Catheter Placement  09/07/2012   *RADIOLOGY REPORT*  Clinical Data/Indication: GASTROCUTANEOUS FISTULA  ABSCESS DRAINAGE  Sedation: Versed 1.5 mg, Fentanyl 50 mg.  Total Moderate Sedation Time: 34 minutes.  Contrast Volume: 10 ml Omnipaque-300.  Fluoroscopy Time: 12 minutes and 18 seconds.  Procedure: The procedure, risks, benefits, and alternatives were explained to the patient. Questions regarding the procedure were encouraged and answered. The patient understands and consents to the procedure.  The right upper quadrant was prepped with chlor prep in a sterile fashion, and a sterile drape was applied covering the operative field. A sterile gown and sterile gloves were used for the procedure.  The site of the  enterocutaneous fistula was localized.  A Kumpe catheter was inserted and contrast was injected which failed to reveal a tract.  After gentle manipulation with a glide wire, a repeat injection opacifying the tract towards the stomach.  The Kumpe was advanced over the glide wire into a potential space within the peritoneal cavity adjacent to the gastric fistula.  It was removed over an Amplatz.  A 10-French dilator followed by a 10- French drain was advanced over the Amplatz and coiled within the space.  Was looped and string fixed.  Contrast was injected.  Findings: Imaging delineates the gastrocutaneous fistula extending to the skin.  Subsequent imaging demonstrates passage of the wire and catheter into the peritoneal space.  The final image demonstrates a 10-French abscess drain adjacent to the lesser curvature of the stomach.  Contrast injected demonstrates the gastric fistula.  Contrast fills the stomach.  Complications: None.  IMPRESSION: Successful passage of a 10-French abscess drain through the gastrocutaneous fistula and positioning adjacent to the leakage site within the peritoneal space.  The fistula occurs at the lesser curvature of the stomach.   Original Report Authenticated By: Jolaine Click, M.D.   Dg Chest Port 1 View  09/07/2012   *RADIOLOGY REPORT*  Clinical Data: PICC placement.  PORTABLE CHEST - 1 VIEW  Comparison: 08/09/2012  Findings: PICC catheters been inserted and is in excellent position of the superior vena cava with the tip 2.5 cm below the carina.  No pneumothorax.  The heart  size and vascularity are normal.  There has been almost complete clearing of the left lung base since the prior study.  There is residual pleural thickening and slight atelectasis at the left base.  Minimal atelectasis at the right base.  Probable tiny residual left effusion.  IMPRESSION: PICC line in good position.  Improved aeration at both lung bases.   Original Report Authenticated By: Francene Boyers, M.D.     Anti-infectives:   Assessment/Plan: s/p * No surgery found *   Rt upper abd drain intact Output good Will follow   LOS: 2 days    Kiarra Kidd A 09/08/2012

## 2012-09-08 NOTE — Progress Notes (Signed)
Agree I D/W IR team as well. Patient examined and I agree with the assessment and plan  Violeta Gelinas, MD, MPH, FACS Pager: [phone]  09/08/2012 1:44 PM

## 2012-09-09 LAB — CBC
HCT: 30.2 % — ABNORMAL LOW (ref 36.0–46.0)
Hemoglobin: 10.2 g/dL — ABNORMAL LOW (ref 12.0–15.0)
MCH: 26.9 pg (ref 26.0–34.0)
MCHC: 33.8 g/dL (ref 30.0–36.0)
MCV: 79.7 fL (ref 78.0–100.0)

## 2012-09-09 LAB — GLUCOSE, CAPILLARY: Glucose-Capillary: 96 mg/dL (ref 70–99)

## 2012-09-09 MED ORDER — CLINIMIX E/DEXTROSE (5/20) 5 % IV SOLN
INTRAVENOUS | Status: AC
  Administered 2012-09-09 (×2): via INTRAVENOUS
  Filled 2012-09-09: qty 2000

## 2012-09-09 MED ORDER — OXYCODONE-ACETAMINOPHEN 5-325 MG/5ML PO SOLN
10.0000 mL | ORAL | Status: DC | PRN
  Administered 2012-09-09: 10 mL via ORAL
  Filled 2012-09-09 (×2): qty 10

## 2012-09-09 MED ORDER — SODIUM CHLORIDE 0.9 % IV SOLN
INTRAVENOUS | Status: DC
  Administered 2012-09-11 – 2012-09-12 (×2): via INTRAVENOUS
  Administered 2012-09-13: 75 mL via INTRAVENOUS
  Administered 2012-09-13 – 2012-09-15 (×3): via INTRAVENOUS
  Administered 2012-09-16: 1000 mL via INTRAVENOUS
  Administered 2012-09-16 – 2012-09-19 (×4): via INTRAVENOUS

## 2012-09-09 NOTE — Progress Notes (Signed)
Subjective: Complains of ongoing pain, across abdomen.  Tired of being in hospital, and wants to go home.  Agrees she cannot go home on IV morphine.  Drain today is a cloudy brown colored liquid.  Objective: Vital signs in last 24 hours: Temp:  [98 F (36.7 C)-98.9 F (37.2 C)] 98 F (36.7 C) (05/20 0500) Pulse Rate:  [73-78] 73 (05/20 0500) Resp:  [18-20] 20 (05/20 0500) BP: (92-104)/(49-58) 101/49 mmHg (05/20 0500) SpO2:  [99 %-100 %] 100 % (05/20 0500) Last BM Date: 09/08/12 Afebrile, VSS,  No labs No I/O She is having BM's Intake/Output from previous day: 05/19 0701 - 05/20 0700 In: 20  Out: 150 [Drains:150] Intake/Output this shift:    General appearance: alert, cooperative and no distress Resp: clear to auscultation bilaterally GI: soft, complains of pain across abodmen.  She is up in chair looking at Ipad.  No distension.+BS,BM  Lab Results:   Recent Labs  09/08/12 0700 09/09/12 0800  WBC 7.7 8.6  HGB 9.2* 10.2*  HCT 27.2* 30.2*  PLT 420* 489*    BMET  Recent Labs  09/07/12 0528 09/08/12 0700  NA 135 134*  K 4.2 4.5  CL 98 100  CO2 24 23  GLUCOSE 96 79  BUN 11 20  CREATININE 0.73 0.55  CALCIUM 9.7 9.2   PT/INR No results found for this basename: LABPROT, INR,  in the last 72 hours   Recent Labs Lab 09/06/12 0921 09/07/12 0528 09/08/12 0700  AST 15 17 13   ALT 9 9 8   ALKPHOS 99 96 83  BILITOT 0.1* 0.4 0.2*  PROT 7.8 7.8 7.0  ALBUMIN 2.7* 2.5* 2.2*     Lipase     Component Value Date/Time   LIPASE 46 09/06/2012 1610     Studies/Results: Dg Chest Port 1 View  09/07/2012   *RADIOLOGY REPORT*  Clinical Data: PICC placement.  PORTABLE CHEST - 1 VIEW  Comparison: 08/09/2012  Findings: PICC catheters been inserted and is in excellent position of the superior vena cava with the tip 2.5 cm below the carina.  No pneumothorax.  The heart size and vascularity are normal.  There has been almost complete clearing of the left lung base since  the prior study.  There is residual pleural thickening and slight atelectasis at the left base.  Minimal atelectasis at the right base.  Probable tiny residual left effusion.  IMPRESSION: PICC line in good position.  Improved aeration at both lung bases.   Original Report Authenticated By: Francene Boyers, M.D.   Ir Image Guided Drainage Percut Cath  Peritoneal Retroperit  09/07/2012   *RADIOLOGY REPORT*  Clinical Data/Indication: GASTROCUTANEOUS FISTULA  ABSCESS DRAINAGE  Sedation: Versed 1.5 mg, Fentanyl 50 mg.  Total Moderate Sedation Time: 34 minutes.  Contrast Volume: 10 ml Omnipaque-300.  Fluoroscopy Time: 12 minutes and 18 seconds.  Procedure: The procedure, risks, benefits, and alternatives were explained to the patient. Questions regarding the procedure were encouraged and answered. The patient understands and consents to the procedure.  The right upper quadrant was prepped with chlor prep in a sterile fashion, and a sterile drape was applied covering the operative field. A sterile gown and sterile gloves were used for the procedure.  The site of the enterocutaneous fistula was localized.  A Kumpe catheter was inserted and contrast was injected which failed to reveal a tract.  After gentle manipulation with a glide wire, a repeat injection opacifying the tract towards the stomach.  The Kumpe was advanced over the  glide wire into a potential space within the peritoneal cavity adjacent to the gastric fistula.  It was removed over an Amplatz.  A 10-French dilator followed by a 10- French drain was advanced over the Amplatz and coiled within the space.  Was looped and string fixed.  Contrast was injected.  Findings: Imaging delineates the gastrocutaneous fistula extending to the skin.  Subsequent imaging demonstrates passage of the wire and catheter into the peritoneal space.  The final image demonstrates a 10-French abscess drain adjacent to the lesser curvature of the stomach.  Contrast injected demonstrates  the gastric fistula.  Contrast fills the stomach.  Complications: None.  IMPRESSION: Successful passage of a 10-French abscess drain through the gastrocutaneous fistula and positioning adjacent to the leakage site within the peritoneal space.  The fistula occurs at the lesser curvature of the stomach.   Original Report Authenticated By: Jolaine Click, M.D.    Medications: . sodium chloride   Intravenous Q24H  . sodium chloride   Intravenous Q24H  . acetaminophen  1,000 mg Intravenous Q6H  . insulin aspart  0-9 Units Subcutaneous Q6H  . micafungin (MYCAMINE) IV  100 mg Intravenous Q24H  . piperacillin-tazobactam (ZOSYN)  IV  3.375 g Intravenous Q8H  . sodium chloride  10-40 mL Intracatheter Q12H    Assessment/Plan Gastrocutaneous fistula with new abscess; S/P EXPLORATORY LAP FOR REPAIR OF GASTRIC LEAK,07/13/12 Dr. Johna Sheriff  Sp/ Gastric bx and omental patch repair; repair of supraumbilical hernia 07/02/12, Dr. [name]  IR GUIDED DRAIN W CATHETER PLACEMENT   09/07/2012, Art A Hoss, MD. For worsening Intraabdominal abscess/gastrocutaneous fistula  Bilateral pulmonary embolism, currently on heparin  PCM/TNA   Plan:  Start some clears and see how she does. Hard to give her pain med with allergy to hydrocodone and codeine.  LOS: 3 days    Ryne Mctigue 09/09/2012

## 2012-09-09 NOTE — Progress Notes (Signed)
I had long discussion about situation with she and her family.  She sounds like she has possibly failed conservative therapy but will need to discuss with dr [name].  I will let her have clears. She is drained now.  Possibly send home when pain controlled and can see dr [name] for long term plan which may be surgical

## 2012-09-09 NOTE — Care Management Note (Addendum)
Page 1 of 2   09/19/2012     11:02:27 AM   CARE MANAGEMENT NOTE 09/19/2012  Patient:  Cynthia Bowen, Cynthia Bowen   Account Number:  [id number]  Date Initiated:  09/09/2012  Documentation initiated by:  [facility]  Subjective/Objective Assessment:   dx intra abd abscess post pricedure , bil pe. Has IR guided drain with catheter.  admit- lives alone.  pt was with [facility] infusion services for TNA [phone].     Action/Plan:   pt has jp drain whicch she informed the RN that she can do her self , she did this before at home.   Anticipated DC Date:  09/19/2012   Anticipated DC Plan:  HOME W HOME HEALTH SERVICES      DC Planning Services  CM consult      [facility] Choice  HOME HEALTH  Resumption Of Svcs/PTA Provider   Choice offered to / List presented to:  [name]        HH arranged  HH-1 RN      [facility] agency  OTHER - SEE NOTE  [facility]   Status of service:  Completed, signed off Medicare Important Message given?   (If response is "NO", the following Medicare IM given date fields will be blank) Date Medicare IM given:   Date Additional Medicare IM given:    Discharge Disposition:  HOME W HOME HEALTH SERVICES  Per UR Regulation:  Reviewed for med. necessity/level of care/duration of stay  If discussed at Long Length of Stay Meetings, dates discussed:    Comments:  09/19/12 11:00 Letha Cape RN, BSN [phone] Patient is for dc today, [facility] infusion has been notified and they will have the TPN at patient's house by 5 pm and they have been in contact with patient's nephew. Also [facility] has been notified for [facility] for wound and drain care, that she is being dc 'd today.  09/18/12 11:13 Letha Cape RN, BSN [phone] patient is for dc tomorrow, [facility] has the script which was faxed by [facility] phone [phone]  over the weekend and she stated yesterday that nothing has changed with the formula it is still the same.  I am faxing updated labs to [facility]  infusion, they state they will not be able to do the wound care because they do not have the supplies this will have to be set up through another hh agency. Patient chose [facility], referral made to [facility] for [facility] for wound and drain care.  Informed them patient is for dc tomorrow soc will begin on Sat.  Patient lives with her nephew and he is teachable.  Patient has transportation home.  [facility] will drop the TPN off at patient's home tomorrow around 5 pm and they will contact her nephew, Arturo Morton.  09/12/12 16:29 Letha Cape RN, BSN [phone] patient is currently with [facility] infusion services at [phone] fax [phone], I will fax h/p currentl labs, they will also need tpn script and wound care information.  Paged pharmacy to get tpn information, awaiting call. Pharmacist states she is already gone for the day and will be back in the am and she will fax the tpn script to [facility].  If patient dc over w/e [facility] will need wound care instruction and tpn information.  09/09/12 16:08 Letha Cape RN, BSN [phone] patient is s/p IR guided drain placement (jp), patient tells RN that she has had this before at home so  she knows how to empty drain and dress it, she does not need home health.  The RN, states she will make sure patient shows her that she can do this.  NCM will continue to follow for dc needs.

## 2012-09-09 NOTE — Progress Notes (Signed)
PARENTERAL NUTRITION CONSULT NOTE - FOLLOW UP  Pharmacy Consult for Continuation of chronic home TPN Indication: GI leak, intraabdominal abscess  Allergies  Allergen Reactions  . Avelox (Moxifloxacin Hcl In Nacl) Nausea And Vomiting  . Betadine (Povidone Iodine) Itching and Rash  . Alendronate Sodium Nausea And Vomiting  . Aspirin Nausea Only  . Codeine Nausea And Vomiting  . Doxycycline   . Fluconazole   . Hydrocodone Nausea And Vomiting    GI distress  . Neurontin (Gabapentin) Other (See Comments)    Mood changes   . Nsaids Other (See Comments)    Severe gastritis & perforation - avoid NSAIDs when possible  . Sertraline Hcl Other (See Comments)    Hallucinations   . Sulfa Antibiotics Rash    Patient Measurements: Height: 5\' 5"  (165.1 cm) Weight: 92 lb 12.8 oz (42.094 kg) IBW/kg (Calculated) : 57  Vital Signs: Temp: 98 F (36.7 C) (05/20 0500) Temp src: Oral (05/20 0500) BP: 101/49 mmHg (05/20 0500) Pulse Rate: 73 (05/20 0500) Intake/Output from previous day: 05/19 0701 - 05/20 0700 In: 20  Out: 150 [Drains:150] Intake/Output from this shift:    Labs:  Recent Labs  09/08/12 0455 09/08/12 0700 09/09/12 0800  WBC 7.9 7.7 8.6  HGB 9.5* 9.2* 10.2*  HCT 28.3* 27.2* 30.2*  PLT 410* 420* 489*     Recent Labs  09/06/12 0921 09/07/12 0528 09/08/12 0700  NA 133* 135 134*  K 4.3 4.2 4.5  CL 97 98 100  CO2 21 24 23   GLUCOSE 112* 96 79  BUN 21 11 20   CREATININE 0.59 0.73 0.55  CALCIUM 9.3 9.7 9.2  MG  --  1.9 1.9  PHOS  --  4.5 3.4  PROT 7.8 7.8 7.0  ALBUMIN 2.7* 2.5* 2.2*  AST 15 17 13   ALT 9 9 8   ALKPHOS 99 96 83  BILITOT 0.1* 0.4 0.2*  PREALBUMIN  --  9.9* 7.7*  TRIG  --  107 61  CHOL  --  108 86   Estimated Creatinine Clearance: 46.6 ml/min (by C-G formula based on Cr of 0.55).    Recent Labs  09/08/12 1709 09/08/12 2324 09/09/12 0605  GLUCAP 80 147* 96    Insulin Requirements in the past 24 hours:  1 unit SSI  Current  Nutrition:  Cyclic Clinimix E 5/20, total 1600 mL/12h: 50 ml/hr x 1hr then increase to 150 ml/hr x 10h then decrease to 50 ml/hr x 1hr  Home TPN- provided by [employer] [facility] (tele: [phone]) Formula per Viviann Spare, RPh:  78 g clinisol 15%, 312 g dextrose 70%, No Ca, 8 mEq Mag, 17 mEq KCl, 15 mmol KPhos, 15 mEq NaAcetate, 40 mEq NaCl, 50 g lipids MWF only, MWI MWF only, Trace elements (Addamel) daily.   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 Also noted to have h/o B PE on Lovenox at home, now on heparin drip to accommodate possible procedures. Started to take in liquids PTA which lead to N/V. On admit CT revealed possible gastric leak with fluid collection that may be an abscess. Now s/p drain placement in IR 5/18. Meets criteria for severe malnutrition d/t 22% weight loss in 4 months and muscle and fat wasting  GI: see above. Drain  output 12mL/24 hours; now to start clears per surgery  Endo: no h/o DM. No insulin in TPN at home. On SSI/CBGs q4h to ensure she is tolerating hospital formula  Lytes: No new labs today  Renal: SCr 0.55, CrCl ~65ml/min; UOP 4x  Hepatobil: LFTs remain wnl; albumin low at 2.2; baseline prealbumin low 9.9, recheck dropped further to 7.7; TChole 86, trigs 61  Pulm: RA   Neuro: receiving IV morphine and IV acetaminophen for pain- noted surgeries plan to convert her to something for pain control to go home on.  Cards: BP soft, HR nml- no meds   ID: afeb, wbc 8.6; abx for abscess which is s/p drainage 5/17: zosyn>>  5/18: micafungin>>   Best Practices: heparin gtt for h/o PE   TPN Access: PICC prior to arrival for home TPN   TPN day#: chronic cyclic at home   Plan:  - Continue cyclic Clinimix E 5/20, total 1600  mL/12h: 50 ml/hr x 1hr then increase to 150 ml/hr x 10h then decrease to 50 ml/hr x 1hr. This provides 80g of protein/day and an average of 1622kcal/day, meeting 100% of protein goal and 100% of kcal goal. - Provide MVI/trace, IV lipids MWF only due to national shortage  - Continue IVF at [facility] from 6p-6a while TNA hangs and 1110mL/hr when TPN is off - continue CBGs/SSI q6hr- will D/C if normal after another 24 hours - will order BMET + phos and mag for tomorrow morning in order to assess patient's tolerance of TPN - follow up tolerance of clear liquid diet   Jakeria Caissie D. Arvon Schreiner, PharmD Clinical Pharmacist Pager: [phone] 09/09/2012 8:46 AM

## 2012-09-09 NOTE — Progress Notes (Signed)
ANTICOAGULATION & ANTIBIOTIC CONSULT NOTE - Follow Up Consult  Pharmacy Consult for Heparin and Zosyn Indication: pulmonary embolus (hx 08/05/12); intra-abdominal abscess/gastrocutaneous fistula  Patient Measurements: Height: 5\' 5"  (165.1 cm) Weight: 92 lb 12.8 oz (42.094 kg) IBW/kg (Calculated) : 57 Heparin Dosing Weight: 42.1 kg  Vital Signs: Temp: 98 F (36.7 C) (05/20 0500) Temp src: Oral (05/20 0500) BP: 101/49 mmHg (05/20 0500) Pulse Rate: 73 (05/20 0500)  Labs:  Recent Labs  09/07/12 0528  09/08/12 0455 09/08/12 0700 09/08/12 1250 09/08/12 2224 09/09/12 0800  HGB 9.7*  --  9.5* 9.2*  --   --  10.2*  HCT 28.2*  --  28.3* 27.2*  --   --  30.2*  PLT 441*  --  410* 420*  --   --  489*  HEPARINUNFRC  --   < > <0.10*  --  0.12* 0.18* 0.42  CREATININE 0.73  --   --  0.55  --   --   --   < > = values in this interval not displayed.  Estimated Creatinine Clearance: 46.6 ml/min (by C-G formula based on Cr of 0.55).  Assessment:  Off home Lovenox for drain placement, done in IR on 5/18.  Heparin level now therapeutic on 1350 units/hr. CBC low stable.    Day # 4 Zoysn and day # 3 Micafungin for intra-abdominal abscess/gastrocutaneous fistula (Fluconazole allergy). Drain output noted cloudy and brown. Afebrile, WBC 8.6.  Goal of Therapy:  Heparin level 0.3-0.7 units/ml Monitor platelets by anticoagulation protocol: Yes Appropriate Zosyn dose for renal function and infection   Plan:   Continue heparin drip at 1350 units/hr.  Daily heparin level and CBC.     Continue Zosyn 3.375 grams IV q8hrs (each infused over 4 hours).  Also on Micafungin 100 mg IV q24hrs.   Culture drain output?    Dennie Fetters, RPh Pager: [phone] 09/09/2012,11:58 AM

## 2012-09-10 ENCOUNTER — Encounter (INDEPENDENT_AMBULATORY_CARE_PROVIDER_SITE_OTHER): Payer: Self-pay

## 2012-09-10 ENCOUNTER — Inpatient Hospital Stay (HOSPITAL_COMMUNITY): Payer: Medicare Other

## 2012-09-10 LAB — CBC
Hemoglobin: 9.1 g/dL — ABNORMAL LOW (ref 12.0–15.0)
MCH: 26.8 pg (ref 26.0–34.0)
RBC: 3.39 MIL/uL — ABNORMAL LOW (ref 3.87–5.11)

## 2012-09-10 LAB — GLUCOSE, CAPILLARY
Glucose-Capillary: 125 mg/dL — ABNORMAL HIGH (ref 70–99)
Glucose-Capillary: 65 mg/dL — ABNORMAL LOW (ref 70–99)
Glucose-Capillary: 75 mg/dL (ref 70–99)
Glucose-Capillary: 84 mg/dL (ref 70–99)
Glucose-Capillary: 89 mg/dL (ref 70–99)

## 2012-09-10 LAB — BASIC METABOLIC PANEL
CO2: 28 mEq/L (ref 19–32)
Chloride: 100 mEq/L (ref 96–112)
Creatinine, Ser: 0.47 mg/dL — ABNORMAL LOW (ref 0.50–1.10)

## 2012-09-10 LAB — PHOSPHORUS: Phosphorus: 2.9 mg/dL (ref 2.3–4.6)

## 2012-09-10 LAB — MAGNESIUM: Magnesium: 1.7 mg/dL (ref 1.5–2.5)

## 2012-09-10 LAB — HEPARIN LEVEL (UNFRACTIONATED): Heparin Unfractionated: 0.39 IU/mL (ref 0.30–0.70)

## 2012-09-10 MED ORDER — HEPARIN (PORCINE) IN NACL 100-0.45 UNIT/ML-% IJ SOLN
1350.0000 [IU]/h | INTRAMUSCULAR | Status: AC
  Filled 2012-09-10: qty 250

## 2012-09-10 MED ORDER — IOHEXOL 300 MG/ML  SOLN
25.0000 mL | INTRAMUSCULAR | Status: AC
  Administered 2012-09-10 (×2): 25 mL via ORAL

## 2012-09-10 MED ORDER — IOHEXOL 300 MG/ML  SOLN
80.0000 mL | Freq: Once | INTRAMUSCULAR | Status: AC | PRN
  Administered 2012-09-10: 80 mL via INTRAVENOUS

## 2012-09-10 MED ORDER — ENOXAPARIN SODIUM 60 MG/0.6ML ~~LOC~~ SOLN
60.0000 mg | SUBCUTANEOUS | Status: DC
  Administered 2012-09-10: 60 mg via SUBCUTANEOUS
  Filled 2012-09-10 (×3): qty 0.6

## 2012-09-10 MED ORDER — OXYCODONE-ACETAMINOPHEN 5-325 MG/5ML PO SOLN
5.0000 mL | ORAL | Status: DC | PRN
  Administered 2012-09-10: 15 mL via ORAL
  Administered 2012-09-10: 10 mL via ORAL
  Administered 2012-09-11 – 2012-09-13 (×3): 15 mL via ORAL
  Administered 2012-09-13: 10 mL via ORAL
  Administered 2012-09-13: 15 mL via ORAL
  Administered 2012-09-14: 10 mL via ORAL
  Administered 2012-09-14: 15 mL via ORAL
  Filled 2012-09-10: qty 10
  Filled 2012-09-10 (×2): qty 15
  Filled 2012-09-10: qty 10
  Filled 2012-09-10 (×3): qty 15
  Filled 2012-09-10: qty 10
  Filled 2012-09-10: qty 15

## 2012-09-10 MED ORDER — FAT EMULSION 20 % IV EMUL
240.0000 mL | INTRAVENOUS | Status: AC
  Administered 2012-09-10: 240 mL via INTRAVENOUS
  Filled 2012-09-10: qty 250

## 2012-09-10 MED ORDER — TRACE MINERALS CR-CU-F-FE-I-MN-MO-SE-ZN IV SOLN
INTRAVENOUS | Status: AC
  Administered 2012-09-10 – 2012-09-11 (×2): via INTRAVENOUS
  Filled 2012-09-10: qty 2000

## 2012-09-10 MED ORDER — MAGNESIUM SULFATE 40 MG/ML IJ SOLN
2.0000 g | Freq: Once | INTRAMUSCULAR | Status: AC
  Administered 2012-09-10: 2 g via INTRAVENOUS
  Filled 2012-09-10: qty 50

## 2012-09-10 NOTE — Progress Notes (Signed)
She feels better on my evaluation, her incision is still tender with some thickening, I would like to rescan her to look at both abscess and to make sure this does not need to be opened.  Otherwise agree

## 2012-09-10 NOTE — Progress Notes (Signed)
ANTICOAGULATION CONSULT NOTE   Pharmacy Consult for Lovenox Indication: pulmonary embolus (hx 08/05/12)  Patient Measurements: Height: 5\' 5"  (165.1 cm) Weight: 92 lb 12.8 oz (42.094 kg) IBW/kg (Calculated) : 57 Heparin Dosing Weight: 42.1 kg  Vital Signs: Temp: 99 F (37.2 C) (05/21 0500) Temp src: Oral (05/21 0500) BP: 97/57 mmHg (05/21 0500) Pulse Rate: 81 (05/21 0500)  Labs:  Recent Labs  09/08/12 0700  09/08/12 2224 09/09/12 0800 09/10/12 0555  HGB 9.2*  --   --  10.2* 9.1*  HCT 27.2*  --   --  30.2* 27.3*  PLT 420*  --   --  489* 468*  HEPARINUNFRC  --   < > 0.18* 0.42 0.39  CREATININE 0.55  --   --   --  0.47*  < > = values in this interval not displayed.  Estimated Creatinine Clearance: 46.6 ml/min (by C-G formula based on Cr of 0.47).  Assessment:  67 yo female with h/o PE for Lovenox  Goal of Therapy: Therapeutic anticoagulation with Lovenox  Monitor platelets by anticoagulation protocol: Yes  Plan:  Lovenox 60 mg SQ q24h (1.5 mg/kg q24h)  Geannie Risen, PharmD, BCPS  09/10/2012,7:07 AM

## 2012-09-10 NOTE — Progress Notes (Signed)
  Subjective: Rt upper abd gastrocutaneous fistula/abscess drain placed 5/18 Pt some better Output significant  Objective: Vital signs in last 24 hours: Temp:  [97.2 F (36.2 C)-99 F (37.2 C)] 99 F (37.2 C) (05/21 0500) Pulse Rate:  [59-81] 81 (05/21 0500) Resp:  [18-20] 20 (05/21 0500) BP: (97-137)/(41-57) 97/57 mmHg (05/21 0500) SpO2:  [97 %-100 %] 99 % (05/21 0500) Last BM Date: 09/08/12  PE:  Afeb; VSS Wbc wnl Site of drain: mildlytender No bleeding Output 100cc yesterday Thin dark reddish brown fluid No cx seen   Lab Results:   Recent Labs  09/09/12 0800 09/10/12 0555  WBC 8.6 7.7  HGB 10.2* 9.1*  HCT 30.2* 27.3*  PLT 489* 468*   BMET  Recent Labs  09/08/12 0700 09/10/12 0555  NA 134* 135  K 4.5 4.8  CL 100 100  CO2 23 28  GLUCOSE 79 82  BUN 20 18  CREATININE 0.55 0.47*  CALCIUM 9.2 9.0      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   LOS: 4 days    Brayton El 09/10/2012

## 2012-09-10 NOTE — Progress Notes (Addendum)
Hypoglycemic Event  CBG: 65  Treatment: 15 GM carbohydrate snack  Symptoms: None  Follow-up CBG: Time: 1517 CBG Result: 84  Possible Reasons for Event: Unknown  Comments/MD notified: n/a    Cynthia Bowen  Remember to initiate Hypoglycemia Order Set & complete

## 2012-09-10 NOTE — Progress Notes (Signed)
  Subjective: She doesn't feel as good this AM as yesterday, but she's not really sure why. Oral pain med seems to be working, she will try it more today.  The nursing staff tend to push the IV meds.  Objective: Vital signs in last 24 hours: Temp:  [97.2 F (36.2 C)-99 F (37.2 C)] 99 F (37.2 C) (05/21 0500) Pulse Rate:  [59-81] 81 (05/21 0500) Resp:  [18-20] 20 (05/21 0500) BP: (97-137)/(41-57) 97/57 mmHg (05/21 0500) SpO2:  [97 %-100 %] 99 % (05/21 0500) Last BM Date: 09/08/12 828 ml PO recorded, 100 ml from the drain Diet: clears, afebrile, VSS Labs OK  Intake/Output from previous day: 05/20 0701 - 05/21 0700 In: 2934.2 [P.O.:838; I.V.:362.8; IV Piggyback:100; TPN:1618.3] Out: 100 [Drains:100] Intake/Output this shift:    General appearance: alert, cooperative and no distress Resp: clear to auscultation bilaterally GI: soft, tender, says she feels someting with PO's.  + BM she has some clear dark brown colored fluid in JP, she is drinking cranberry juice now.  Lab Results:   Recent Labs  09/09/12 0800 09/10/12 0555  WBC 8.6 7.7  HGB 10.2* 9.1*  HCT 30.2* 27.3*  PLT 489* 468*    BMET  Recent Labs  09/08/12 0700 09/10/12 0555  NA 134* 135  K 4.5 4.8  CL 100 100  CO2 23 28  GLUCOSE 79 82  BUN 20 18  CREATININE 0.55 0.47*  CALCIUM 9.2 9.0   PT/INR No results found for this basename: LABPROT, INR,  in the last 72 hours   Recent Labs Lab 09/06/12 0921 09/07/12 0528 09/08/12 0700  AST 15 17 13   ALT 9 9 8   ALKPHOS 99 96 83  BILITOT 0.1* 0.4 0.2*  PROT 7.8 7.8 7.0  ALBUMIN 2.7* 2.5* 2.2*     Lipase     Component Value Date/Time   LIPASE 46 09/06/2012 0921     Studies/Results: No results found.  Medications: . sodium chloride   Intravenous Q24H  . sodium chloride   Intravenous Q24H  . enoxaparin (LOVENOX) injection  60 mg Subcutaneous Q24H  . insulin aspart  0-9 Units Subcutaneous Q6H  . micafungin (MYCAMINE) IV  100 mg Intravenous  Q24H  . piperacillin-tazobactam (ZOSYN)  IV  3.375 g Intravenous Q8H  . sodium chloride  10-40 mL Intracatheter Q12H    Assessment/Plan Gastrocutaneous fistula with new abscess; S/P EXPLORATORY LAP FOR REPAIR OF GASTRIC LEAK,07/13/12 Dr. Johna Sheriff  Sp/ Gastric bx and omental patch repair; repair of supraumbilical hernia 07/02/12, Dr. [name]  IR GUIDED DRAIN W CATHETER PLACEMENT   09/07/2012, Art A Hoss, MD. For worsening Intraabdominal abscess/gastrocutaneous fistula  Bilateral pulmonary embolism, currently on heparin  PCM/TNA    Plan:  Change TNA back to cyclic.  Get her back on lovenox for her Anticoagulation. Try to control her discomfort with PO meds.         LOS: 4 days    Kit Mollett 09/10/2012

## 2012-09-10 NOTE — Progress Notes (Signed)
PARENTERAL NUTRITION CONSULT NOTE - FOLLOW UP  Pharmacy Consult for Continuation of chronic home TPN Indication: GI leak, intra-abdominal abscess  Allergies  Allergen Reactions  . Avelox (Moxifloxacin Hcl In Nacl) Nausea And Vomiting  . Betadine (Povidone Iodine) Itching and Rash  . Alendronate Sodium Nausea And Vomiting  . Aspirin Nausea Only  . Codeine Nausea And Vomiting  . Doxycycline   . Fluconazole   . Hydrocodone Nausea And Vomiting    GI distress  . Neurontin (Gabapentin) Other (See Comments)    Mood changes   . Nsaids Other (See Comments)    Severe gastritis & perforation - avoid NSAIDs when possible  . Sertraline Hcl Other (See Comments)    Hallucinations   . Sulfa Antibiotics Rash    Patient Measurements: Height: 5\' 5"  (165.1 cm) Weight: 92 lb 12.8 oz (42.094 kg) IBW/kg (Calculated) : 57  Vital Signs: Temp: 99 Bowen (37.2 C) (05/21 0500) Temp src: Oral (05/21 0500) BP: 97/57 mmHg (05/21 0500) Pulse Rate: 81 (05/21 0500) Intake/Output from previous day: 05/20 0701 - 05/21 0700 In: 2934.2 [P.O.:838; I.V.:362.8; IV Piggyback:100; TPN:1618.3] Out: 100 [Drains:100] Intake/Output from this shift:    Labs:  Recent Labs  09/08/12 0700 09/09/12 0800 09/10/12 0555  WBC 7.7 8.6 7.7  HGB 9.2* 10.2* 9.1*  HCT 27.2* 30.2* 27.3*  PLT 420* 489* 468*     Recent Labs  09/08/12 0700 09/10/12 0555  NA 134* 135  K 4.5 4.8  CL 100 100  CO2 23 28  GLUCOSE 79 82  BUN 20 18  CREATININE 0.55 0.47*  CALCIUM 9.2 9.0  MG 1.9 1.7  PHOS 3.4 2.9  PROT 7.0  --   ALBUMIN 2.2*  --   AST 13  --   ALT 8  --   ALKPHOS 83  --   BILITOT 0.2*  --   PREALBUMIN 7.7*  --   TRIG 61  --   CHOL 86  --    Estimated Creatinine Clearance: 46.6 ml/min (by C-G formula based on Cr of 0.47).    Recent Labs  09/09/12 1654 09/10/12 0012 09/10/12 0538  GLUCAP 84 125* 89    Insulin Requirements in the past 24 hours:  None  Current Nutrition:  Clear Liquid diet and  Cyclic Clinimix E 5/20, total 1600 mL/12h: 50 ml/hr x 1hr then increase to 150 ml/hr x 10h then decrease to 50 ml/hr x 1hr  Home TPN- provided by [employer] [facility] (tele: [phone]) Formula per Viviann Spare, RPh:  78 g clinisol 15%, 312 g dextrose 70%, No Ca, 8 mEq Mag, 17 mEq KCl, 15 mmol KPhos, 15 mEq NaAcetate, 40 mEq NaCl, 50 g lipids MWF only, MWI MWF only, Trace elements (Addamel) daily.   Nutritional Goals:  1450-1600 kCal, 70-80 grams of protein per day   Assessment:  67 yo Bowen on chronic cyclic TPN PTA for ~ 1 month due to gastrocutaneous fistual s/p exp lap for repair of gastric leak 3/23 and gastric bx and omental patch repair of supraumbilical hernia 3/12. Also noted to have h/o B PE now transitioned from heparin to Lovenox. Started to take in liquids PTA which lead to N/V. On admit CT revealed possible gastric leak with fluid collection that may be an abscess. Now s/p drain placement in IR 5/18. Meets criteria for severe malnutrition d/t 22% weight loss in 4 months and muscle and fat wasting  GI: see above. Drain output 164mL/24 hours; had about of po clears yesterday  Endo: no  h/o DM. No insulin in TPN at home. Has required minimal insulin here with SSI  Lytes: K 4.8, Phos 2.9, Mag 1.7 all nml along with other lytes wnl  Renal: SCr 0.47, CrCl ~88ml/min; UOP 5x  Hepatobil: LFTs remain wnl; albumin low at 2.2; baseline prealbumin low 9.9, recheck dropped further to 7.7; TChole 86, trigs 61  Pulm: RA   Neuro: receiving PRN IV morphine and PRN Roxicet solution for pain- noted surgery's plan to send home when pain is more controlled  Cards: BP soft, HR nml- no meds   ID: afeb, wbc 7.7; abx for abscess which is s/p drainage 5/17: zosyn>>  5/18: micafungin>>   Best Practices: Lovenox for h/o PE- note platelets remain elevated  TPN Access: PICC prior to arrival for home TPN   TPN day#: chronic cyclic at home   Plan:  - will give 2g IV mag x1 - Continue cyclic  Clinimix E 5/20, total 1600 mL/12h: 50 ml/hr x 1hr then increase to 150 ml/hr x 10h then decrease to 50 ml/hr x 1hr. This provides 80g of protein/day and an average of 1622kcal/day, meeting 100% of protein goal and 100% of kcal goal. - Provide MVI/trace, IV lipids MWF only due to national shortage  - Continue IVF at [facility] from 6p-6a while TNA hangs and 56mL/hr when TPN is off - Will D/C CBGs/SSI as requirements are minimal - follow up tolerance of clear liquid diet  * patient has been on cyclic TNA since admission*   Miosha Behe D. Tyishia Aune, PharmD Clinical Pharmacist Pager: [phone] 09/10/2012 8:39 AM

## 2012-09-11 ENCOUNTER — Encounter (HOSPITAL_COMMUNITY): Payer: Self-pay | Admitting: Certified Registered"

## 2012-09-11 ENCOUNTER — Encounter (HOSPITAL_COMMUNITY)
Admission: EM | Disposition: A | Discharge status: Home Health | Payer: Self-pay | Source: Home / Self Care | Attending: Surgery

## 2012-09-11 ENCOUNTER — Telehealth: Payer: Self-pay

## 2012-09-11 ENCOUNTER — Inpatient Hospital Stay (HOSPITAL_COMMUNITY): Payer: Medicare Other | Admitting: Certified Registered"

## 2012-09-11 ENCOUNTER — Encounter (INDEPENDENT_AMBULATORY_CARE_PROVIDER_SITE_OTHER): Payer: Federal, State, Local not specified - PPO | Admitting: Surgery

## 2012-09-11 DIAGNOSIS — L089 Local infection of the skin and subcutaneous tissue, unspecified: Secondary | ICD-10-CM

## 2012-09-11 DIAGNOSIS — K763 Infarction of liver: Secondary | ICD-10-CM

## 2012-09-11 LAB — GLUCOSE, CAPILLARY
Glucose-Capillary: 100 mg/dL — ABNORMAL HIGH (ref 70–99)
Glucose-Capillary: 126 mg/dL — ABNORMAL HIGH (ref 70–99)
Glucose-Capillary: 74 mg/dL (ref 70–99)
Glucose-Capillary: 86 mg/dL (ref 70–99)

## 2012-09-11 SURGERY — DEBRIDEMENT OF ABDOMINAL WALL ABSCESS
Anesthesia: General | Wound class: Dirty or Infected

## 2012-09-11 MED ORDER — LIDOCAINE HCL (CARDIAC) 20 MG/ML IV SOLN
INTRAVENOUS | Status: DC | PRN
  Administered 2012-09-11: 40 mg via INTRAVENOUS

## 2012-09-11 MED ORDER — ENOXAPARIN SODIUM 60 MG/0.6ML ~~LOC~~ SOLN
60.0000 mg | SUBCUTANEOUS | Status: DC
  Administered 2012-09-11 – 2012-09-18 (×8): 60 mg via SUBCUTANEOUS
  Filled 2012-09-11 (×9): qty 0.6

## 2012-09-11 MED ORDER — ZINC TRACE METAL 1 MG/ML IV SOLN
INTRAVENOUS | Status: AC
  Administered 2012-09-11: 18:00:00 via INTRAVENOUS
  Filled 2012-09-11: qty 2000

## 2012-09-11 MED ORDER — SUCCINYLCHOLINE CHLORIDE 20 MG/ML IJ SOLN
INTRAMUSCULAR | Status: DC | PRN
  Administered 2012-09-11: 100 mg via INTRAVENOUS

## 2012-09-11 MED ORDER — SUFENTANIL CITRATE 50 MCG/ML IV SOLN
INTRAVENOUS | Status: DC | PRN
  Administered 2012-09-11 (×3): 10 ug via INTRAVENOUS

## 2012-09-11 MED ORDER — MIDAZOLAM HCL 5 MG/5ML IJ SOLN
INTRAMUSCULAR | Status: DC | PRN
  Administered 2012-09-11: 2 mg via INTRAVENOUS

## 2012-09-11 MED ORDER — ONDANSETRON HCL 4 MG/2ML IJ SOLN
4.0000 mg | Freq: Four times a day (QID) | INTRAMUSCULAR | Status: DC | PRN
  Administered 2012-09-13: 4 mg via INTRAVENOUS

## 2012-09-11 MED ORDER — MORPHINE SULFATE (PF) 1 MG/ML IV SOLN
INTRAVENOUS | Status: AC
  Filled 2012-09-11: qty 25

## 2012-09-11 MED ORDER — MORPHINE SULFATE (PF) 1 MG/ML IV SOLN
INTRAVENOUS | Status: DC
  Administered 2012-09-11: 8.3 mg via INTRAVENOUS
  Administered 2012-09-11 – 2012-09-12 (×4): via INTRAVENOUS
  Administered 2012-09-12: 21.5 mg via INTRAVENOUS
  Administered 2012-09-12: 20.48 mg via INTRAVENOUS
  Administered 2012-09-12 (×3): via INTRAVENOUS
  Administered 2012-09-13: 15 mg via INTRAVENOUS
  Administered 2012-09-13 (×2): via INTRAVENOUS
  Filled 2012-09-11 (×7): qty 25

## 2012-09-11 MED ORDER — LACTATED RINGERS IV SOLN
INTRAVENOUS | Status: DC
  Administered 2012-09-11: 11:00:00 via INTRAVENOUS

## 2012-09-11 MED ORDER — DIPHENHYDRAMINE HCL 12.5 MG/5ML PO ELIX
12.5000 mg | ORAL_SOLUTION | Freq: Four times a day (QID) | ORAL | Status: DC | PRN
  Filled 2012-09-11: qty 5

## 2012-09-11 MED ORDER — PROPOFOL 10 MG/ML IV BOLUS
INTRAVENOUS | Status: DC | PRN
  Administered 2012-09-11: 110 mg via INTRAVENOUS
  Administered 2012-09-11: 40 mg via INTRAVENOUS

## 2012-09-11 MED ORDER — SODIUM CHLORIDE 0.9 % IJ SOLN: 9.0000 mL | INTRAMUSCULAR | Status: DC | PRN

## 2012-09-11 MED ORDER — LACTATED RINGERS IV SOLN
INTRAVENOUS | Status: DC | PRN
  Administered 2012-09-11: 11:00:00 via INTRAVENOUS

## 2012-09-11 MED ORDER — DIPHENHYDRAMINE HCL 50 MG/ML IJ SOLN
12.5000 mg | Freq: Four times a day (QID) | INTRAMUSCULAR | Status: DC | PRN
  Filled 2012-09-11 (×4): qty 1

## 2012-09-11 MED ORDER — NALOXONE HCL 0.4 MG/ML IJ SOLN: 0.4000 mg | INTRAMUSCULAR | Status: DC | PRN

## 2012-09-11 MED ORDER — FENTANYL CITRATE 0.05 MG/ML IJ SOLN
25.0000 ug | INTRAMUSCULAR | Status: DC | PRN
  Administered 2012-09-11 (×2): 50 ug via INTRAVENOUS

## 2012-09-11 MED ORDER — BARRIER CREAM NON-SPECIFIED
1.0000 "application " | TOPICAL_CREAM | TOPICAL | Status: DC | PRN
  Filled 2012-09-11: qty 1

## 2012-09-11 MED ORDER — FENTANYL CITRATE 0.05 MG/ML IJ SOLN
INTRAMUSCULAR | Status: AC
  Filled 2012-09-11: qty 2

## 2012-09-11 MED ORDER — 0.9 % SODIUM CHLORIDE (POUR BTL) OPTIME
TOPICAL | Status: DC | PRN
  Administered 2012-09-11: 1000 mL

## 2012-09-11 SURGICAL SUPPLY — 30 items
APPLICATOR COTTON TIP 6IN STRL (MISCELLANEOUS) ×4 IMPLANT
CANISTER SUCTION 2500CC (MISCELLANEOUS) ×2 IMPLANT
CANISTER WOUND CARE 500ML ATS (WOUND CARE) ×2 IMPLANT
CLOTH BEACON ORANGE TIMEOUT ST (SAFETY) ×2 IMPLANT
COVER SURGICAL LIGHT HANDLE (MISCELLANEOUS) ×2 IMPLANT
DRAPE LAPAROSCOPIC ABDOMINAL (DRAPES) ×2 IMPLANT
DRSG PAD ABDOMINAL 8X10 ST (GAUZE/BANDAGES/DRESSINGS) ×2 IMPLANT
DRSG VAC ATS LRG SENSATRAC (GAUZE/BANDAGES/DRESSINGS) IMPLANT
DURAPREP 26ML APPLICATOR (WOUND CARE) IMPLANT
ELECT REM PT RETURN 9FT ADLT (ELECTROSURGICAL) ×2
ELECTRODE REM PT RTRN 9FT ADLT (ELECTROSURGICAL) ×1 IMPLANT
GLOVE BIO SURGEON STRL SZ7 (GLOVE) ×2 IMPLANT
GLOVE BIOGEL PI IND STRL 7.5 (GLOVE) ×1 IMPLANT
GLOVE BIOGEL PI INDICATOR 7.5 (GLOVE) ×1
GOWN STRL NON-REIN LRG LVL3 (GOWN DISPOSABLE) ×4 IMPLANT
KIT BASIN OR (CUSTOM PROCEDURE TRAY) ×2 IMPLANT
KIT ROOM TURNOVER OR (KITS) ×2 IMPLANT
LOOP VESSEL MAXI BLUE (MISCELLANEOUS) IMPLANT
NS IRRIG 1000ML POUR BTL (IV SOLUTION) ×2 IMPLANT
PACK GENERAL/GYN (CUSTOM PROCEDURE TRAY) ×2 IMPLANT
PAD ARMBOARD 7.5X6 YLW CONV (MISCELLANEOUS) ×4 IMPLANT
PAD NEG PRESSURE SENSATRAC (MISCELLANEOUS) ×2 IMPLANT
SPONGE ABDOMINAL VAC ABTHERA (MISCELLANEOUS) ×2 IMPLANT
SPONGE GAUZE 4X4 12PLY (GAUZE/BANDAGES/DRESSINGS) ×2 IMPLANT
STAPLER VISISTAT 35W (STAPLE) ×2 IMPLANT
SUCTION POOLE TIP (SUCTIONS) IMPLANT
TAPE CLOTH SURG 6X10 WHT LF (GAUZE/BANDAGES/DRESSINGS) ×2 IMPLANT
TOWEL OR 17X24 6PK STRL BLUE (TOWEL DISPOSABLE) ×2 IMPLANT
TOWEL OR 17X26 10 PK STRL BLUE (TOWEL DISPOSABLE) ×2 IMPLANT
WATER STERILE IRR 1000ML POUR (IV SOLUTION) IMPLANT

## 2012-09-11 NOTE — Progress Notes (Signed)
PARENTERAL NUTRITION CONSULT NOTE - FOLLOW UP  Pharmacy Consult for Continuation of chronic home TPN Indication: GI leak, intra-abdominal abscess  Allergies  Allergen Reactions  . Avelox (Moxifloxacin Hcl In Nacl) Nausea And Vomiting  . Betadine (Povidone Iodine) Itching and Rash  . Alendronate Sodium Nausea And Vomiting  . Aspirin Nausea Only  . Codeine Nausea And Vomiting  . Doxycycline   . Fluconazole   . Hydrocodone Nausea And Vomiting    GI distress  . Neurontin (Gabapentin) Other (See Comments)    Mood changes   . Nsaids Other (See Comments)    Severe gastritis & perforation - avoid NSAIDs when possible  . Sertraline Hcl Other (See Comments)    Hallucinations   . Sulfa Antibiotics Rash    Patient Measurements: Height: 5\' 5"  (165.1 cm) Weight: 92 lb 12.8 oz (42.094 kg) IBW/kg (Calculated) : 57  Vital Signs: Temp: 97.9 F (36.6 C) (05/22 0500) Temp src: Oral (05/22 0500) BP: 113/67 mmHg (05/22 0500) Pulse Rate: 67 (05/22 0500) Intake/Output from previous day: 05/21 0701 - 05/22 0700 In: 2753 [P.O.:900; IV Piggyback:100; TPN:1748] Out: 200 [Drains:200] Intake/Output from this shift:    Labs:  Recent Labs  09/09/12 0800 09/10/12 0555  WBC 8.6 7.7  HGB 10.2* 9.1*  HCT 30.2* 27.3*  PLT 489* 468*     Recent Labs  09/10/12 0555  NA 135  K 4.8  CL 100  CO2 28  GLUCOSE 82  BUN 18  CREATININE 0.47*  CALCIUM 9.0  MG 1.7  PHOS 2.9   Estimated Creatinine Clearance: 46.6 ml/min (by C-G formula based on Cr of 0.47).    Recent Labs  09/11/12 0022 09/11/12 0631 09/11/12 0757  GLUCAP 126* 86 100*    Insulin Requirements in the past 24 hours:  None- SSI has been discontinued and patient does not have insulin in TPN bag  Current Nutrition:  NPO; Cyclic Clinimix E 5/20, total 1600 mL/12h: 50 ml/hr x 1hr then increase to 150 ml/hr x 10h then decrease to 50 ml/hr x 1hr  Home TPN- provided by [facility] (tele: [phone]) Formula  per Viviann Spare, RPh:  78 g clinisol 15%, 312 g dextrose 70%, No Ca, 8 mEq Mag, 17 mEq KCl, 15 mmol KPhos, 15 mEq NaAcetate, 40 mEq NaCl, 50 g lipids MWF only, MWI MWF only, Trace elements (Addamel) daily.   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 Also noted to have h/o B PE now transitioned from heparin to Lovenox. Started to take in liquids PTA which lead to N/V. On admit CT revealed possible gastric leak with fluid collection that may be an abscess. Now s/p drain placement in IR 5/18. Meets criteria for severe malnutrition d/t 22% weight loss in 4 months and muscle and fat wasting  GI:  Drain output 280mL/24 hours; To go to surgery today for I&D of area under umbilicus which had air worsening on CT  Endo: no h/o DM. No insulin in TPN at home. SSI has been stopped due to minimal requirements  Lytes: labs from this morning had not been drawn as patient is in OR currently  Renal: SCr <1 , CrCl ~31ml/min; UOP 1x  Hepatobil:  baseline prealbumin low 9.9, recheck dropped further to 7.7; TChole  86, trigs 61; labs not drawn yet today as patient is currently in OR  Pulm: RA   Neuro: receiving PRN IV morphine and PRN Roxicet solution for pain- noted surgery's plan to send home when pain is more controlled  Cards: BP soft, HR nml- no meds   ID: afeb, wbc 7.7; abx for abscess which is s/p drainage- planned for repeat drainage today (5/22) 5/17: zosyn>>  5/18: micafungin>>   Best Practices: Lovenox for h/o PE- note platelets remain elevated  TPN Access: PICC prior to arrival for home TPN   TPN day#: chronic cyclic at home   Plan:  - Continue cyclic Clinimix E 5/20, total 1600 mL/12h: 50 ml/hr x 1hr then increase to 150 ml/hr x 10h then decrease to 50 ml/hr x 1hr.  This provides 80g of protein/day and an average of 1622kcal/day, meeting 100% of protein goal and 100% of kcal goal. - Provide MVI/trace, IV lipids MWF only due to national shortage  - will add 5mg  of zinc and 500mg  of vitamin c in daily TPN to aid with wound healing - [facility] at [facility] from 6p-6a while TNA hangs and 39mL/hr when TPN is off - follow up tolerance of clear liquid diet - will follow up labs from today and replete as needed  * patient has been on cyclic TNA since admission*   Coburn Knaus D. Adylee Leonardo, PharmD Clinical Pharmacist Pager: [phone] 09/11/2012 9:55 AM

## 2012-09-11 NOTE — Progress Notes (Signed)
Patient ID: Cynthia Bowen, female   DOB: 17-Jul-1945, 67 y.o.   MRN: [id number]    Subjective: Pt still with pain around her umbilicus today.  C/o burning and pain around drain.  Some nausea  Objective: Vital signs in last 24 hours: Temp:  [97.9 F (36.6 C)-98.4 F (36.9 C)] 97.9 F (36.6 C) (05/22 0500) Pulse Rate:  [63-71] 67 (05/22 0500) Resp:  [18-20] 20 (05/22 0500) BP: (113-134)/(52-72) 113/67 mmHg (05/22 0500) SpO2:  [98 %-100 %] 98 % (05/22 0500) Last BM Date: 09/10/12  Intake/Output from previous day: 05/21 0701 - 05/22 0700 In: 2753 [P.O.:900; IV Piggyback:100; TPN:1748] Out: 200 [Drains:200] Intake/Output this shift:    PE: Abd: soft, very  Tender periumbilical. Some excoriation and erythema around drain secondary to acidic leak from gastric contents.  Lab Results:   Recent Labs  09/09/12 0800 09/10/12 0555  WBC 8.6 7.7  HGB 10.2* 9.1*  HCT 30.2* 27.3*  PLT 489* 468*   BMET  Recent Labs  09/10/12 0555  NA 135  K 4.8  CL 100  CO2 28  GLUCOSE 82  BUN 18  CREATININE 0.47*  CALCIUM 9.0   PT/INR No results found for this basename: LABPROT, INR,  in the last 72 hours CMP     Component Value Date/Time   NA 135 09/10/2012 0555   K 4.8 09/10/2012 0555   CL 100 09/10/2012 0555   CO2 28 09/10/2012 0555   GLUCOSE 82 09/10/2012 0555   BUN 18 09/10/2012 0555   BUN 4 08/19/2009   CREATININE 0.47* 09/10/2012 0555   CREATININE 0.90 08/20/2012 1526   CALCIUM 9.0 09/10/2012 0555   PROT 7.0 09/08/2012 0700   ALBUMIN 2.2* 09/08/2012 0700   AST 13 09/08/2012 0700   ALT 8 09/08/2012 0700   ALKPHOS 83 09/08/2012 0700   BILITOT 0.2* 09/08/2012 0700   GFRNONAA >90 09/10/2012 0555   GFRAA >90 09/10/2012 0555   Lipase     Component Value Date/Time   LIPASE 46 09/06/2012 0921       Studies/Results: Ct Abdomen W Contrast  09/10/2012   *RADIOLOGY REPORT*  Clinical Data: Follow up intra-abdominal abscess  CT ABDOMEN WITH CONTRAST  Technique:  Multidetector CT imaging of  the abdomen was performed following the standard protocol during bolus administration of intravenous contrast. Sagittal and coronal MPR images reconstructed from axial data set.  Contrast: 80mL OMNIPAQUE IOHEXOL 300 MG/ML  SOLN Dilute oral contrast.  Comparison: 09/06/2012  Findings: Persistent atelectasis left lower lobe. Pigtail drainage catheter identified inferior to the lateral segment left lobe liver at the site of the previously identified sub hepatic abscess. Minimal residual gas and fluid within this collection, markedly decreased in size. Persistent adjacent wall thickening of the gastric antrum and pylorus. Small amount gas is identified within the ventral surgical wound at the umbilicus increased since previous study.  Bilateral renal cysts with again nonobstructing right renal calculi. Extensive atherosclerotic calcifications aorta significantly narrowing the lumen of the distal abdominal aorta near bifurcation. Liver, spleen, and adrenal glands unremarkable. Tiny cyst within pancreatic body is less well visualized on current exam. Visualized bowel loops grossly unremarkable. No free fluid or definite free air.  IMPRESSION: Marked decrease in size of the previously identified sub hepatic abscess collection adjacent to the lateral segment left lobe liver, containing minimal residual fluid/gas. Persistent adjacent gastric wall thickening. Increased amount of gas within the ventral surgical wound at the umbilicus. Bilateral renal cysts and nonobstructing right renal calculi. Extensive atherosclerotic disease  of the distal abdominal aorta.   Original Report Authenticated By: Ulyses Southward, M.D.    Anti-infectives: Anti-infectives   Start     Dose/Rate Route Frequency Ordered Stop   09/08/12 0800  micafungin (MYCAMINE) 100 mg in sodium chloride 0.9 % 100 mL IVPB     100 mg 100 mL/hr over 1 Hours Intravenous Every 24 hours 09/06/12 1113     09/06/12 2000  piperacillin-tazobactam (ZOSYN) IVPB 3.375 g      3.375 g 12.5 mL/hr over 240 Minutes Intravenous Every 8 hours 09/06/12 1113     09/06/12 1445  piperacillin-tazobactam (ZOSYN) IVPB 3.375 g  Status:  Discontinued     3.375 g 12.5 mL/hr over 240 Minutes Intravenous 3 times per day 09/06/12 1436 09/06/12 1437   09/06/12 1200  micafungin (MYCAMINE) 100 mg in sodium chloride 0.9 % 100 mL IVPB     100 mg 100 mL/hr over 1 Hours Intravenous To Emergency Dept 09/06/12 1113 09/07/12 1200   09/06/12 1100  piperacillin-tazobactam (ZOSYN) IVPB 3.375 g     3.375 g 12.5 mL/hr over 240 Minutes Intravenous  Once 09/06/12 1050 09/06/12 1623       Assessment/Plan  1. Gastric leak, s/p full thickness gastric biopsy 2. PCM/TNA  Plan: 1. CT scan shows worsening air under her umbilicus that is causing her a significant amount of pain.  We will take her to the OR today for I&D of this area. 2. Radiology is ok with [place] taking off the butterfly clasp piece to her drain and using barrier cream around this to help protect her skin. 3. Cont TNA for malnutrition   LOS: 5 days    Perseus Westall E 09/11/2012, 8:15 AM Pager: [phone]

## 2012-09-11 NOTE — Transfer of Care (Signed)
Immediate Anesthesia Transfer of Care Note  Patient: Cynthia Bowen  Procedure(s) Performed: Procedure(s): DEBRIDEMENT OF ABDOMINAL WALL ABSCESS (N/A)  Patient Location: PACU  Anesthesia Type:General  Level of Consciousness: awake, alert  and oriented  Airway & Oxygen Therapy: Patient Spontanous Breathing and Patient connected to nasal cannula oxygen  Post-op Assessment: Report given to PACU RN, Post -op Vital signs reviewed and stable and Patient moving all extremities  Post vital signs: Reviewed and stable  Complications: No apparent anesthesia complications

## 2012-09-11 NOTE — Anesthesia Postprocedure Evaluation (Signed)
  Anesthesia Post-op Note  Patient: Cynthia Bowen  Procedure(s) Performed: Procedure(s): DEBRIDEMENT OF ABDOMINAL WALL ABSCESS (N/A)  Patient Location: PACU  Anesthesia Type:General  Level of Consciousness: awake  Airway and Oxygen Therapy: Patient Spontanous Breathing  Post-op Pain: mild  Post-op Assessment: Post-op Vital signs reviewed  Post-op Vital Signs: Reviewed  Complications: No apparent anesthesia complications

## 2012-09-11 NOTE — Progress Notes (Signed)
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  Long term will plan on drain, open wound , clears and likely surgery in next month or two once nutritionally ready

## 2012-09-11 NOTE — Preoperative (Signed)
Beta Blockers   Reason not to administer Beta Blockers:Not Applicable 

## 2012-09-11 NOTE — Telephone Encounter (Signed)
CAROLYN IS A CASE MANAGER AND WOULD LIKE TO SPEAK WITH DR [name] OR HIS NURSE REGARDING PT PLEASE CALL [phone] [facility] 1 EXT [phone]

## 2012-09-11 NOTE — Anesthesia Procedure Notes (Signed)
Procedure Name: Intubation Date/Time: 09/11/2012 12:17 PM Performed by: Charm Barges, Tecla Mailloux R Pre-anesthesia Checklist: Patient identified, Emergency Drugs available, Suction available, Patient being monitored and Timeout performed Patient Re-evaluated:Patient Re-evaluated prior to inductionOxygen Delivery Method: Circle system utilized Preoxygenation: Pre-oxygenation with 100% oxygen Intubation Type: IV induction Ventilation: Mask ventilation without difficulty Laryngoscope Size: Mac and 3 Grade View: Grade II Tube type: Oral Tube size: 7.5 mm Number of attempts: 1 Airway Equipment and Method: Stylet Placement Confirmation: ETT inserted through vocal cords under direct vision,  breath sounds checked- equal and bilateral and positive ETCO2 Secured at: 20 cm Tube secured with: Tape Dental Injury: Teeth and Oropharynx as per pre-operative assessment

## 2012-09-11 NOTE — Telephone Encounter (Signed)
Eber Jones, case manager from [facility], called and LM on my VM before leaving phone message. She reports that she visited pt in the hospital after surgery and that pt is very depressed. Pt reported she had been on an anti-depressant until March when she had to stop it d/t being NPO. Case manager wanted to report this to Dr Merla Riches and to see if something could be Rxd for pt in another form. Get details from [place] as to NPO status, etc.

## 2012-09-11 NOTE — Progress Notes (Signed)
NUTRITION FOLLOW UP  Pt meets criteria for severe malnutrition in the context of chronic illness 2/2 weight loss of 22% body weight in 4 months and severe muscle and fat wasting per nutrition focused physical exam.  Intervention:   1. Continue TPN per pharmacy  2. If pt is able to take oral nutrition, recommend Juven supplement BID. This provide 80 kcal and 14 gm amino acids per supplement.   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  Goal:   TPN to meet >/=90% estimated nutrition needs. Met  Monitor:   TPN, weight trends, labs, I/O's  Assessment:   Plans for OR today to open midline incision and drain the subq abscess. Does not appear that the fistula will heal on its own and will require surgical intervention, but not until nutrition status is better per notes.  Pt continues on TPN, back on cyclic with some clear liquids.   Per Pharmacy: Continue cyclic Clinimix E 5/20, total 1600 mL/12h: 50 ml/hr x 1hr then increase to 150 ml/hr x 10h then decrease to 50 ml/hr x 1hr. This provides 80g of protein/day and an average of 1622kcal/day, meeting 100% of protein goal and 100% of kcal goal.  Albumin has a half-life of 21 days and is strongly affected by stress response and inflammatory process, therefore, do not expect to see an improvement in this lab value during acute hospitalization.   Currently NPO, did have some clears yesterday. Pt states liquids caused stomach pain. Once pt is able to take clear liquids again, recommend Juven supplement BID. Juven contains added Arginine and Glutamine to promote wound healing.   Height: Ht Readings from Last 1 Encounters:  09/06/12 5\' 5"  (1.651 m)    Weight Status:   Wt Readings from Last 1 Encounters:  09/06/12 92 lb 12.8 oz (42.094 kg)  up 3 lbs from admission weight. Body mass index is 15.44 kg/(m^2). Underweight   Re-estimated needs:  Kcal: 1400-1600 Protein: 75-85 gm  Fluid: >/= 2  L daily with increased output from drain  Skin: drain in right abdomin  Diet Order: NPO   Intake/Output Summary (Last 24 hours) at 09/11/12 1015 Last data filed at 09/11/12 9528  Gross per 24 hour  Intake   2273 ml  Output    100 ml  Net   2173 ml    Last BM: 5/19   Labs:   Recent Labs Lab 09/07/12 0528 09/08/12 0700 09/10/12 0555  NA 135 134* 135  K 4.2 4.5 4.8  CL 98 100 100  CO2 24 23 28   BUN 11 20 18   CREATININE 0.73 0.55 0.47*  CALCIUM 9.7 9.2 9.0  MG 1.9 1.9 1.7  PHOS 4.5 3.4 2.9  GLUCOSE 96 79 82   Albumin  Date/Time Value Range Status  09/08/2012  7:00 AM 2.2* 3.5 - 5.2 g/dL Final  08/04/2438  1:02 AM 2.5* 3.5 - 5.2 g/dL Final  11/15/3662  4:03 AM 2.7* 3.5 - 5.2 g/dL Final   Prealbumin  Date/Time Value Range Status  09/08/2012  7:00 AM 7.7* 17.0 - 34.0 mg/dL Final  4/74/2595  6:38 AM 9.9* 17.0 - 34.0 mg/dL Final  7/56/4332  9:51 PM 27.7  17.0 - 34.0 mg/dL Final     CBG (last 3)   Recent Labs  09/11/12 0022 09/11/12 0631 09/11/12 0757  GLUCAP 126* 86 100*    Scheduled Meds: . sodium chloride   Intravenous Q24H  . sodium chloride  Intravenous Q24H  . enoxaparin (LOVENOX) injection  60 mg Subcutaneous Q24H  . micafungin (MYCAMINE) IV  100 mg Intravenous Q24H  . piperacillin-tazobactam (ZOSYN)  IV  3.375 g Intravenous Q8H  . sodium chloride  10-40 mL Intracatheter Q12H      Clarene Duke RD, LDN Pager [phone] After Hours pager [phone]

## 2012-09-11 NOTE — Anesthesia Preprocedure Evaluation (Addendum)
Anesthesia Evaluation  Patient identified by MRN, date of birth, ID band Patient awake    Reviewed: Allergy & Precautions, H&P , NPO status , Patient's Chart, lab work & pertinent test results  Airway Mallampati: I      Dental  (+) Upper Dentures and Partial Lower   Pulmonary neg COPD         Cardiovascular Rhythm:Regular Rate:Normal     Neuro/Psych Depression    GI/Hepatic GERD-  ,  Endo/Other    Renal/GU      Musculoskeletal  (+) Fibromyalgia -  Abdominal   Peds  Hematology   Anesthesia Other Findings   Reproductive/Obstetrics                        Anesthesia Physical Anesthesia Plan  ASA: III  Anesthesia Plan: General   Post-op Pain Management:    Induction: Intravenous  Airway Management Planned: Oral ETT  Additional Equipment:   Intra-op Plan:   Post-operative Plan: Extubation in OR  Informed Consent: I have reviewed the patients History and Physical, chart, labs and discussed the procedure including the risks, benefits and alternatives for the proposed anesthesia with the patient or authorized representative who has indicated his/her understanding and acceptance.   Dental advisory given  Plan Discussed with: CRNA and Anesthesiologist  Anesthesia Plan Comments:         Anesthesia Quick Evaluation

## 2012-09-11 NOTE — Telephone Encounter (Signed)
Left message for her to call me back. 

## 2012-09-11 NOTE — Progress Notes (Signed)
Subjective: Appreciate Dr. Doreen Salvage care of the patient for the last several days.  Have discussed her case with him.  He is planning a trip to the OR today to open the midline incision and drain the subcutaneous abscess that has accumulated here.  This will likely cause a gastrocutaneous fistula that will hopefully mature over the next several weeks.  It does not appear that her gastric fistula will heal will conservative measures, so she will need a definitive operation once her nutrition is better.  She is not very happy about this plan, but understands the necessity.  Objective: Vital signs in last 24 hours: Temp:  [97.9 F (36.6 C)-98.4 F (36.9 C)] 97.9 F (36.6 C) (05/22 0500) Pulse Rate:  [63-71] 67 (05/22 0500) Resp:  [18-20] 20 (05/22 0500) BP: (113-134)/(52-72) 113/67 mmHg (05/22 0500) SpO2:  [98 %-100 %] 98 % (05/22 0500) Last BM Date: 09/10/12  Intake/Output from previous day: 05/21 0701 - 05/22 0700 In: 2753 [P.O.:900; IV Piggyback:100; TPN:1748] Out: 200 [Drains:200] Intake/Output this shift:    General appearance: alert, cooperative and no distress Resp: clear to auscultation bilaterally GI: Tender in periumbilical region Incision c/d/i; very tender to palpation Drainage - dark, milky  Lab Results:   Recent Labs  09/09/12 0800 09/10/12 0555  WBC 8.6 7.7  HGB 10.2* 9.1*  HCT 30.2* 27.3*  PLT 489* 468*   BMET  Recent Labs  09/10/12 0555  NA 135  K 4.8  CL 100  CO2 28  GLUCOSE 82  BUN 18  CREATININE 0.47*  CALCIUM 9.0   PT/INR No results found for this basename: LABPROT, INR,  in the last 72 hours ABG No results found for this basename: PHART, PCO2, PO2, HCO3,  in the last 72 hours  Studies/Results: Ct Abdomen W Contrast  09/10/2012   *RADIOLOGY REPORT*  Clinical Data: Follow up intra-abdominal abscess  CT ABDOMEN WITH CONTRAST  Technique:  Multidetector CT imaging of the abdomen was performed following the standard protocol during bolus  administration of intravenous contrast. Sagittal and coronal MPR images reconstructed from axial data set.  Contrast: 80mL OMNIPAQUE IOHEXOL 300 MG/ML  SOLN Dilute oral contrast.  Comparison: 09/06/2012  Findings: Persistent atelectasis left lower lobe. Pigtail drainage catheter identified inferior to the lateral segment left lobe liver at the site of the previously identified sub hepatic abscess. Minimal residual gas and fluid within this collection, markedly decreased in size. Persistent adjacent wall thickening of the gastric antrum and pylorus. Small amount gas is identified within the ventral surgical wound at the umbilicus increased since previous study.  Bilateral renal cysts with again nonobstructing right renal calculi. Extensive atherosclerotic calcifications aorta significantly narrowing the lumen of the distal abdominal aorta near bifurcation. Liver, spleen, and adrenal glands unremarkable. Tiny cyst within pancreatic body is less well visualized on current exam. Visualized bowel loops grossly unremarkable. No free fluid or definite free air.  IMPRESSION: Marked decrease in size of the previously identified sub hepatic abscess collection adjacent to the lateral segment left lobe liver, containing minimal residual fluid/gas. Persistent adjacent gastric wall thickening. Increased amount of gas within the ventral surgical wound at the umbilicus. Bilateral renal cysts and nonobstructing right renal calculi. Extensive atherosclerotic disease of the distal abdominal aorta.   Original Report Authenticated By: Ulyses Southward, M.D.    Anti-infectives: Anti-infectives   Start     Dose/Rate Route Frequency Ordered Stop   09/08/12 0800  micafungin (MYCAMINE) 100 mg in sodium chloride 0.9 % 100 mL IVPB  100 mg 100 mL/hr over 1 Hours Intravenous Every 24 hours 09/06/12 1113     09/06/12 2000  piperacillin-tazobactam (ZOSYN) IVPB 3.375 g     3.375 g 12.5 mL/hr over 240 Minutes Intravenous Every 8 hours  09/06/12 1113     09/06/12 1445  piperacillin-tazobactam (ZOSYN) IVPB 3.375 g  Status:  Discontinued     3.375 g 12.5 mL/hr over 240 Minutes Intravenous 3 times per day 09/06/12 1436 09/06/12 1437   09/06/12 1200  micafungin (MYCAMINE) 100 mg in sodium chloride 0.9 % 100 mL IVPB     100 mg 100 mL/hr over 1 Hours Intravenous To Emergency Dept 09/06/12 1113 09/07/12 1200   09/06/12 1100  piperacillin-tazobactam (ZOSYN) IVPB 3.375 g     3.375 g 12.5 mL/hr over 240 Minutes Intravenous  Once 09/06/12 1050 09/06/12 1623      Assessment/Plan: s/p * No surgery found * Incision and drainage of midline wound today by Dr. Dwain Sarna  Continue antibiotics, TNA  LOS: 5 days    Adrick Kestler K. 09/11/2012

## 2012-09-11 NOTE — Progress Notes (Signed)
Visited with pt as per charge nurse's recommendation. Pt's sister and brother-in-law were present in the room when I visited with the pt. Pt's sister and brother-in-law appeared upbeat and were supportive of the pt. Pt was preparing to go into surgery when I met with her today. Pt appeared to be in low spirits due to the fact that she is going for surgery. Pt and I processed feelings about going in for surgery. I informed that pt that I would be back on Monday and will visit with her if she is still in the hospital. Pt's family expressed appreciation for my visit.   Sherol Dade Lowery A [facility] Counselor Intern

## 2012-09-11 NOTE — Op Note (Signed)
Preoperative diagnosis: Gastrocutaneous fistula Postoperative diagnoses: Same as above Procedure: Incision and drainage of abdominal wall abscess with creation of gastrocutaneous fistula Surgeon: Dr. Harden Mo Anesthesia: Gen. Estimated blood loss: Minimal Specimens: None Drains: None Complications: None Sponge and needle count correct x2 at end of operation Disposition to recovery in stable condition  Indications: This is a 67 year old female who underwent full-thickness gastric biopsy to rule out carcinoma in the past. She was reoperated on for a leak. She has had a continued leak and this is attempted to be healed conservatively. This has not been successful. She came back in over the weekend and had a drain placed. She has increasing pain as well as a CT scan that shows increasing air underneath her incision. I discussed with her going to the OR and draining this which essentially will make a gastrocutaneous fistula.She is agreeable to this.  Procedure: After informed consent was obtained the patient was taken to the operating room.Sequential compression devices were on her legs. She was then placed under general anesthesia without complication. Her abdomen was prepped and draped in the standard sterile surgical fashion. A surgical timeout was then performed.  I identified the area that showed up on her CT scan. This was pretty clear clinically as well at this point. I made an elliptical incision which included her umbilicus. I then debrided this area and ended up having to remove her umbilicus as this was connected to it. There was a cavity at the base of this with some of her suture present. I did not go any farther than just draining the air and opening up this area. I am not sure that this is gastric wall or mucosa at the base of this. This also does be abscess cavity. I then irrigated this. Hemostasis was observed. I then packed this with moist gauze. A dressing was placed. She  tolerated this well was extubated and transferred to recovery stable.

## 2012-09-12 ENCOUNTER — Encounter (INDEPENDENT_AMBULATORY_CARE_PROVIDER_SITE_OTHER): Payer: Self-pay

## 2012-09-12 DIAGNOSIS — E46 Unspecified protein-calorie malnutrition: Secondary | ICD-10-CM

## 2012-09-12 LAB — BASIC METABOLIC PANEL
BUN: 19 mg/dL (ref 6–23)
Creatinine, Ser: 0.56 mg/dL (ref 0.50–1.10)
GFR calc non Af Amer: >90 mL/min (ref >90)
Glucose, Bld: 105 mg/dL — ABNORMAL HIGH (ref 70–99)
Potassium: 4.5 mEq/L (ref 3.5–5.1)

## 2012-09-12 LAB — GLUCOSE, CAPILLARY: Glucose-Capillary: 143 mg/dL — ABNORMAL HIGH (ref 70–99)

## 2012-09-12 MED ORDER — FAT EMULSION 20 % IV EMUL
240.0000 mL | INTRAVENOUS | Status: AC
  Administered 2012-09-12: 240 mL via INTRAVENOUS
  Filled 2012-09-12: qty 250

## 2012-09-12 MED ORDER — ZINC TRACE METAL 1 MG/ML IV SOLN
INTRAVENOUS | Status: AC
  Administered 2012-09-12: 18:00:00 via INTRAVENOUS
  Filled 2012-09-12: qty 2000

## 2012-09-12 NOTE — Progress Notes (Signed)
ANTICOAGULATION & ANTIBIOTIC CONSULT NOTE - Follow Up Consult  Pharmacy Consult for LMWH and Zosyn Indication: pulmonary embolus (hx 08/05/12); intra-abdominal abscess/gastrocutaneous fistula  Patient Measurements: Height: 5\' 5"  (165.1 cm) Weight: 92 lb 12.8 oz (42.094 kg) IBW/kg (Calculated) : 57 Heparin Dosing Weight: 42.1 kg  Vital Signs: Temp: 98.4 F (36.9 C) (05/23 0400) Temp src: Oral (05/23 0400) BP: 106/67 mmHg (05/23 0400) Pulse Rate: 86 (05/23 0400)  Labs:  Recent Labs  09/10/12 0555 09/12/12 0910  HGB 9.1*  --   HCT 27.3*  --   PLT 468*  --   HEPARINUNFRC 0.39  --   CREATININE 0.47* 0.56    Estimated Creatinine Clearance: 46.6 ml/min (by C-G formula based on Cr of 0.56).  Assessment: On full dose LMWH for PE on 08/05/12.  Back to OR 5/22 for I&D abd wall abscess and creation of gastrocutaneous fistula.  Hg 9.1 on 5/21 and PLTC stable at 468.  No bleeding reported.     Day # 7 Zoysn and day # 6 Micafungin for intra-abdominal abscess/gastrocutaneous fistula (Fluconazole allergy). Afebrile, WBC wnl.  Creat wnl.  No culture data.   Goal of Therapy:  Monitor platelets by anticoagulation protocol: Yes Appropriate Zosyn dose for renal function and infection   Plan:   1. Continue LMWH 60 mg sq q24 for full dose for PE   2.  Continue Zosyn 3.375 grams IV q8hrs (each infused over 4 hours).  Also on Micafungin 100 mg IV q24hrs.  3. Culture drain output?  Herby Abraham, Pharm.D. [phone] 09/12/2012 10:39 AM

## 2012-09-12 NOTE — Progress Notes (Signed)
PARENTERAL NUTRITION CONSULT NOTE - FOLLOW UP  Pharmacy Consult for Continuation of chronic home TPN Indication: GI leak, intra-abdominal abscess  Allergies  Allergen Reactions  . Avelox (Moxifloxacin Hcl In Nacl) Nausea And Vomiting  . Betadine (Povidone Iodine) Itching and Rash  . Alendronate Sodium Nausea And Vomiting  . Aspirin Nausea Only  . Codeine Nausea And Vomiting  . Doxycycline   . Fluconazole   . Hydrocodone Nausea And Vomiting    GI distress  . Neurontin (Gabapentin) Other (See Comments)    Mood changes   . Nsaids Other (See Comments)    Severe gastritis & perforation - avoid NSAIDs when possible  . Sertraline Hcl Other (See Comments)    Hallucinations   . Sulfa Antibiotics Rash    Patient Measurements: Height: 5\' 5"  (165.1 cm) Weight: 92 lb 12.8 oz (42.094 kg) IBW/kg (Calculated) : 57  Vital Signs: Temp: 98.4 F (36.9 C) (05/23 0400) Temp src: Oral (05/23 0400) BP: 106/67 mmHg (05/23 0400) Pulse Rate: 86 (05/23 0400) Intake/Output from previous day: 05/22 0701 - 05/23 0700 In: 2800 [I.V.:1150; IV Piggyback:100; TPN:1550] Out: 1610 [Urine:1250; Drains:360] Intake/Output from this shift: Total I/O In: -  Out: 200 [Urine:200]  Labs:  Recent Labs  09/10/12 0555  WBC 7.7  HGB 9.1*  HCT 27.3*  PLT 468*     Recent Labs  09/10/12 0555  NA 135  K 4.8  CL 100  CO2 28  GLUCOSE 82  BUN 18  CREATININE 0.47*  CALCIUM 9.0  MG 1.7  PHOS 2.9   Estimated Creatinine Clearance: 46.6 ml/min (by C-G formula based on Cr of 0.47).    Recent Labs  09/11/12 1727 09/11/12 2359 09/12/12 0718  GLUCAP 74 143* 86    Insulin Requirements in the past 24 hours:  None- SSI has been discontinued and patient does not have insulin in TPN bag  Current Nutrition:  Clear liquids; Cyclic Clinimix E 5/20, total 1600 mL/12h: 50 ml/hr x 1hr then increase to 150 ml/hr x 10h then decrease to 50 ml/hr x 1hr  Home TPN- provided by [facility]  (tele: [phone]) Formula per Viviann Spare, RPh:  78 g clinisol 15%, 312 g dextrose 70%, No Ca, 8 mEq Mag, 17 mEq KCl, 15 mmol KPhos, 15 mEq NaAcetate, 40 mEq NaCl, 50 g lipids MWF only, MWI MWF only, Trace elements (Addamel) daily.   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 Also noted to have h/o B PE now transitioned from heparin to Lovenox. Started to take in liquids PTA which lead to N/V. On admit CT revealed possible gastric leak with fluid collection that may be an abscess. Now s/p drain placement in IR 5/18. Meets criteria for severe malnutrition d/t 22% weight loss in 4 months and muscle and fat wasting  GI:  Drain output 355mL/24 hours; repeat I&D of area under umbilicus which had air worsening on CT on 5/22; note plans to send patient home on cyclic TPN + clear liquid diet  Endo: no h/o DM. No insulin in TPN at home. SSI has been stopped due to minimal requirements  Lytes: Patient on LR. All lytes WNL, K 4.5  Renal: SCr <0.56 , CrCl ~33ml/min; UOP 1x  Hepatobil:  baseline prealbumin low 9.9,  recheck dropped further to 7.7; TChole 86, trigs 61; 5/19 LFTs all normal except low albumin and slightly low TBili  Pulm: RA   Neuro: receiving PRN IV morphine and PRN Roxicet solution for pain- noted surgery's plan to send home when pain is more controlled  Cards: BP soft, HR nml- no meds   ID: afeb, wbc 7.7; abx for abscess which is s/p drainage- repeat drainage (5/22) 5/17: zosyn>>  5/18: micafungin>>   Best Practices: Lovenox for h/o PE- note platelets remain elevated  TPN Access: PICC prior to arrival for home TPN   TPN day#: chronic cyclic at home   Plan:  - Continue cyclic Clinimix E 5/20, total 1600 mL/12h: 50 ml/hr x 1hr then increase to 150  ml/hr x 10h then decrease to 50 ml/hr x 1hr. This provides 80g of protein/day and an average of 1622kcal/day, meeting 100% of protein goal and 100% of kcal goal. - Provide MVI/trace, IV lipids MWF only due to national shortage  - will add 5mg  of zinc and 500mg  of vitamin c in daily TPN to aid with wound healing - Continue IVF at [facility] from 6p-6a while TNA hangs and 65mL/hr when TPN is off - follow up tolerance of clear liquid diet  * patient has been on cyclic TNA since admission*   Alfie Alderfer D. Geo Slone, PharmD Clinical Pharmacist Pager: [phone] 09/12/2012 8:28 AM

## 2012-09-12 NOTE — Progress Notes (Signed)
Patient is obviously discouraged with her slow progress.  Abd feels much better today after releasing the subcutaneous abscess.  Clear liquids today Continue TNA Wean off PCA Dressing changes  Will need to restart home health TNA ([facility]) Will also need home health nursing for dressing changes/ drain care  Goal - if no healing of fistula in the next few weeks, will have to consider exploratory laparotomy/ partial gastrectomy.  Obviously, this would be high risk due to her malnutrition, as evidenced by her non-healing fistula.  Hopefully home in the next few days.  [name]. [name], MD, [facility]  General/ Trauma Surgery  09/12/2012 8:51 AM

## 2012-09-12 NOTE — Progress Notes (Signed)
1 Day Post-Op  Subjective: Rt upper abd gastrocutaneous fistula/abscess drain placed 5/18 Pt had to go to OR for open drainage of ant air/fluid collection. Discouraged but understands plan.  Objective: Vital signs in last 24 hours: Temp:  [97 F (36.1 C)-98.4 F (36.9 C)] 98.4 F (36.9 C) (05/23 0400) Pulse Rate:  [65-88] 86 (05/23 0400) Resp:  [2-21] 18 (05/23 1010) BP: (93-129)/(54-76) 106/67 mmHg (05/23 0400) SpO2:  [96 %-100 %] 98 % (05/23 0801) FiO2 (%):  [0.5 %-2 %] 2 % (05/23 0004) Last BM Date: 09/11/12  PE:  Afeb; VSS Wbc wnl Site of drain: mildlytender No bleeding Output 360cc yesterday Thin dark reddish brown fluid  Lab Results:   Recent Labs  09/10/12 0555  WBC 7.7  HGB 9.1*  HCT 27.3*  PLT 468*   BMET  Recent Labs  09/10/12 0555 09/12/12 0910  NA 135 139  K 4.8 4.5  CL 100 100  CO2 28 27  GLUCOSE 82 105*  BUN 18 19  CREATININE 0.47* 0.56  CALCIUM 9.0 9.7      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   LOS: 6 days    Cynthia Bowen 09/12/2012

## 2012-09-12 NOTE — Progress Notes (Signed)
Patient ID: Cynthia Bowen, female   DOB: [birth year], 67 y.o.   MRN: [id number] 1 Day Post-Op  Subjective: Pt feels ok this morning.  Pain is a little better.  Mild nausea  Objective: Vital signs in last 24 hours: Temp:  [97 F (36.1 C)-98.4 F (36.9 C)] 98.4 F (36.9 C) (05/23 0400) Pulse Rate:  [65-88] 86 (05/23 0400) Resp:  [10-21] 13 (05/23 0401) BP: (93-129)/(54-76) 106/67 mmHg (05/23 0400) SpO2:  [96 %-100 %] 100 % (05/23 0401) FiO2 (%):  [0.5 %-2 %] 2 % (05/23 0004) Last BM Date: 09/11/12  Intake/Output from previous day: 05/22 0701 - 05/23 0700 In: 2800 [I.V.:1150; IV Piggyback:100; TPN:1550] Out: 1610 [Urine:1250; Drains:360] Intake/Output this shift:    PE: Abd: soft, tender, jp drain with watered down bilious output.  Midline wound is clean.  No drainage noted when packing removed, but gauze packing itself is stained with bilious drainage.  Lab Results:   Recent Labs  09/09/12 0800 09/10/12 0555  WBC 8.6 7.7  HGB 10.2* 9.1*  HCT 30.2* 27.3*  PLT 489* 468*   BMET  Recent Labs  09/10/12 0555  NA 135  K 4.8  CL 100  CO2 28  GLUCOSE 82  BUN 18  CREATININE 0.47*  CALCIUM 9.0   PT/INR No results found for this basename: LABPROT, INR,  in the last 72 hours CMP     Component Value Date/Time   NA 135 09/10/2012 0555   K 4.8 09/10/2012 0555   CL 100 09/10/2012 0555   CO2 28 09/10/2012 0555   GLUCOSE 82 09/10/2012 0555   BUN 18 09/10/2012 0555   BUN 4 08/19/2009   CREATININE 0.47* 09/10/2012 0555   CREATININE 0.90 08/20/2012 1526   CALCIUM 9.0 09/10/2012 0555   PROT 7.0 09/08/2012 0700   ALBUMIN 2.2* 09/08/2012 0700   AST 13 09/08/2012 0700   ALT 8 09/08/2012 0700   ALKPHOS 83 09/08/2012 0700   BILITOT 0.2* 09/08/2012 0700   GFRNONAA >90 09/10/2012 0555   GFRAA >90 09/10/2012 0555   Lipase     Component Value Date/Time   LIPASE 46 09/06/2012 0921       Studies/Results: Ct Abdomen W Contrast  09/10/2012   *RADIOLOGY REPORT*  Clinical Data: Follow up  intra-abdominal abscess  CT ABDOMEN WITH CONTRAST  Technique:  Multidetector CT imaging of the abdomen was performed following the standard protocol during bolus administration of intravenous contrast. Sagittal and coronal MPR images reconstructed from axial data set.  Contrast: 80mL OMNIPAQUE IOHEXOL 300 MG/ML  SOLN Dilute oral contrast.  Comparison: 09/06/2012  Findings: Persistent atelectasis left lower lobe. Pigtail drainage catheter identified inferior to the lateral segment left lobe liver at the site of the previously identified sub hepatic abscess. Minimal residual gas and fluid within this collection, markedly decreased in size. Persistent adjacent wall thickening of the gastric antrum and pylorus. Small amount gas is identified within the ventral surgical wound at the umbilicus increased since previous study.  Bilateral renal cysts with again nonobstructing right renal calculi. Extensive atherosclerotic calcifications aorta significantly narrowing the lumen of the distal abdominal aorta near bifurcation. Liver, spleen, and adrenal glands unremarkable. Tiny cyst within pancreatic body is less well visualized on current exam. Visualized bowel loops grossly unremarkable. No free fluid or definite free air.  IMPRESSION: Marked decrease in size of the previously identified sub hepatic abscess collection adjacent to the lateral segment left lobe liver, containing minimal residual fluid/gas. Persistent adjacent gastric wall thickening. Increased amount of  gas within the ventral surgical wound at the umbilicus. Bilateral renal cysts and nonobstructing right renal calculi. Extensive atherosclerotic disease of the distal abdominal aorta.   Original Report Authenticated By: Ulyses Southward, M.D.    Anti-infectives: Anti-infectives   Start     Dose/Rate Route Frequency Ordered Stop   09/08/12 0800  micafungin (MYCAMINE) 100 mg in sodium chloride 0.9 % 100 mL IVPB     100 mg 100 mL/hr over 1 Hours Intravenous Every  24 hours 09/06/12 1113     09/06/12 2000  piperacillin-tazobactam (ZOSYN) IVPB 3.375 g     3.375 g 12.5 mL/hr over 240 Minutes Intravenous Every 8 hours 09/06/12 1113     09/06/12 1445  piperacillin-tazobactam (ZOSYN) IVPB 3.375 g  Status:  Discontinued     3.375 g 12.5 mL/hr over 240 Minutes Intravenous 3 times per day 09/06/12 1436 09/06/12 1437   09/06/12 1200  micafungin (MYCAMINE) 100 mg in sodium chloride 0.9 % 100 mL IVPB     100 mg 100 mL/hr over 1 Hours Intravenous To Emergency Dept 09/06/12 1113 09/07/12 1200   09/06/12 1100  piperacillin-tazobactam (ZOSYN) IVPB 3.375 g     3.375 g 12.5 mL/hr over 240 Minutes Intravenous  Once 09/06/12 1050 09/06/12 1623       Assessment/Plan  1. Gastric leak, s/p full thickness biopsy 2. S/p I&D of incision 3. PCM/TNA  Plan: 1. Start clear liquids, cont TNA for nutritional support.  Goal to dc home eventually with cyclic TNA and clear liquids 2.  Start BID dressing changes to wound.  If a lot of drainage begins to come from this wound, then we may need an Eakins pouch.  LOS: 6 days    Chane Magner E 09/12/2012, 7:53 AM Pager: [phone]

## 2012-09-12 NOTE — Progress Notes (Addendum)
Replaced PCA Morphine syringe ,witnessed by Dierdre Highman ,

## 2012-09-12 NOTE — Progress Notes (Signed)
Replaced PCA morphine syringe ,witnessed by Dierdre Highman.

## 2012-09-13 MED ORDER — DIPHENHYDRAMINE HCL 12.5 MG/5ML PO ELIX
12.5000 mg | ORAL_SOLUTION | Freq: Four times a day (QID) | ORAL | Status: DC | PRN
  Filled 2012-09-13: qty 5

## 2012-09-13 MED ORDER — ZINC TRACE METAL 1 MG/ML IV SOLN
INTRAVENOUS | Status: AC
  Administered 2012-09-13 (×2): via INTRAVENOUS
  Filled 2012-09-13: qty 2000

## 2012-09-13 MED ORDER — MORPHINE SULFATE (PF) 1 MG/ML IV SOLN
INTRAVENOUS | Status: DC
  Administered 2012-09-13: 10.5 mg via INTRAVENOUS

## 2012-09-13 MED ORDER — NALOXONE HCL 0.4 MG/ML IJ SOLN: 0.4000 mg | INTRAMUSCULAR | Status: DC | PRN

## 2012-09-13 MED ORDER — SODIUM CHLORIDE 0.9 % IJ SOLN: 9.0000 mL | INTRAMUSCULAR | Status: DC | PRN

## 2012-09-13 MED ORDER — DIPHENHYDRAMINE HCL 50 MG/ML IJ SOLN
12.5000 mg | Freq: Four times a day (QID) | INTRAMUSCULAR | Status: DC | PRN
  Administered 2012-09-13 – 2012-09-18 (×6): 12.5 mg via INTRAVENOUS
  Filled 2012-09-13 (×2): qty 1

## 2012-09-13 MED ORDER — ONDANSETRON HCL 4 MG/2ML IJ SOLN
4.0000 mg | Freq: Four times a day (QID) | INTRAMUSCULAR | Status: DC | PRN
  Administered 2012-09-14 – 2012-09-17 (×7): 4 mg via INTRAVENOUS
  Filled 2012-09-13 (×7): qty 2

## 2012-09-13 NOTE — Progress Notes (Signed)
Patient ID: Cynthia Bowen, female   DOB: [birth year], 67 y.o.   MRN: [id number] 2 Days Post-Op  Subjective: Pt feels a little better.  Having more drainage today from her midline wound.  Some nausea, but eating some clears.  Objective: Vital signs in last 24 hours: Temp:  [98 F (36.7 C)-98.4 F (36.9 C)] 98.4 F (36.9 C) (05/24 0520) Pulse Rate:  [76-87] 86 (05/24 0520) Resp:  [11-20] 15 (05/24 0756) BP: (97-117)/(59-91) 97/59 mmHg (05/24 0520) SpO2:  [94 %-100 %] 100 % (05/24 0756) Last BM Date: 09/10/12  Intake/Output from previous day: 05/23 0701 - 05/24 0700 In: 1335 [P.O.:780; IV Piggyback:550] Out: 2655 [Urine:2450; Drains:205] Intake/Output this shift: Total I/O In: -  Out: 75 [Drains:75]  PE: Abd: soft, tender around incision.  Incisional wound with pale pink tissue and a hole noted at the top of the wound with clear drainage.  There is no bile tinge or stain on her dressing.  Clear output in JP as well.  Lab Results:  No results found for this basename: WBC, HGB, HCT, PLT,  in the last 72 hours BMET  Recent Labs  09/12/12 0910  NA 139  K 4.5  CL 100  CO2 27  GLUCOSE 105*  BUN 19  CREATININE 0.56  CALCIUM 9.7   PT/INR No results found for this basename: LABPROT, INR,  in the last 72 hours CMP     Component Value Date/Time   NA 139 09/12/2012 0910   K 4.5 09/12/2012 0910   CL 100 09/12/2012 0910   CO2 27 09/12/2012 0910   GLUCOSE 105* 09/12/2012 0910   BUN 19 09/12/2012 0910   BUN 4 08/19/2009   CREATININE 0.56 09/12/2012 0910   CREATININE 0.90 08/20/2012 1526   CALCIUM 9.7 09/12/2012 0910   PROT 7.0 09/08/2012 0700   ALBUMIN 2.2* 09/08/2012 0700   AST 13 09/08/2012 0700   ALT 8 09/08/2012 0700   ALKPHOS 83 09/08/2012 0700   BILITOT 0.2* 09/08/2012 0700   GFRNONAA >90 09/12/2012 0910   GFRAA >90 09/12/2012 0910   Lipase     Component Value Date/Time   LIPASE 46 09/06/2012 0921       Studies/Results: No results  found.  Anti-infectives: Anti-infectives   Start     Dose/Rate Route Frequency Ordered Stop   09/08/12 0800  micafungin (MYCAMINE) 100 mg in sodium chloride 0.9 % 100 mL IVPB     100 mg 100 mL/hr over 1 Hours Intravenous Every 24 hours 09/06/12 1113     09/06/12 2000  piperacillin-tazobactam (ZOSYN) IVPB 3.375 g     3.375 g 12.5 mL/hr over 240 Minutes Intravenous Every 8 hours 09/06/12 1113     09/06/12 1445  piperacillin-tazobactam (ZOSYN) IVPB 3.375 g  Status:  Discontinued     3.375 g 12.5 mL/hr over 240 Minutes Intravenous 3 times per day 09/06/12 1436 09/06/12 1437   09/06/12 1200  micafungin (MYCAMINE) 100 mg in sodium chloride 0.9 % 100 mL IVPB     100 mg 100 mL/hr over 1 Hours Intravenous To Emergency Dept 09/06/12 1113 09/07/12 1200   09/06/12 1100  piperacillin-tazobactam (ZOSYN) IVPB 3.375 g     3.375 g 12.5 mL/hr over 240 Minutes Intravenous  Once 09/06/12 1050 09/06/12 1623       Assessment/Plan  1. Gastric leak, controlled with drain 2. GC fistula 3. S/p I&D of midline wound 4. PCM/TNA  Plan: 1. Cont cyclic TNA 2. Having some drainage from midline wound.  Not  sure that it is enough right now to need pouching. 3. Will start transitioning her from her PCA to oral pain medications. 4. Cont appropriate wound care and drain care.    LOS: 7 days    Millenia Waldvogel E 09/13/2012, 8:47 AM Pager: [phone]

## 2012-09-13 NOTE — Progress Notes (Signed)
No change in care. 

## 2012-09-13 NOTE — Progress Notes (Signed)
PARENTERAL NUTRITION CONSULT NOTE - FOLLOW UP  Pharmacy Consult for Continuation of chronic home TPN Indication: GI leak, intra-abdominal abscess  Allergies  Allergen Reactions  . Avelox (Moxifloxacin Hcl In Nacl) Nausea And Vomiting  . Betadine (Povidone Iodine) Itching and Rash  . Alendronate Sodium Nausea And Vomiting  . Aspirin Nausea Only  . Codeine Nausea And Vomiting  . Doxycycline   . Fluconazole   . Hydrocodone Nausea And Vomiting    GI distress  . Neurontin (Gabapentin) Other (See Comments)    Mood changes   . Nsaids Other (See Comments)    Severe gastritis & perforation - avoid NSAIDs when possible  . Sertraline Hcl Other (See Comments)    Hallucinations   . Sulfa Antibiotics Rash    Patient Measurements: Height: 5\' 5"  (165.1 cm) Weight: 92 lb 12.8 oz (42.094 kg) IBW/kg (Calculated) : 57  Vital Signs: Temp: 98.4 F (36.9 C) (05/24 0520) Temp src: Oral (05/24 0520) BP: 97/59 mmHg (05/24 0520) Pulse Rate: 86 (05/24 0520) Intake/Output from previous day: 05/23 0701 - 05/24 0700 In: 1335 [P.O.:780; IV Piggyback:550] Out: 2655 [Urine:2450; Drains:205] Intake/Output from this shift: Total I/O In: -  Out: 75 [Drains:75]  Labs: No results found for this basename: WBC, HGB, HCT, PLT, APTT, INR,  in the last 72 hours   Recent Labs  09/12/12 0910  NA 139  K 4.5  CL 100  CO2 27  GLUCOSE 105*  BUN 19  CREATININE 0.56  CALCIUM 9.7   Estimated Creatinine Clearance: 46.6 ml/min (by C-G formula based on Cr of 0.56).    Recent Labs  09/12/12 0718 09/12/12 1142 09/12/12 1725  GLUCAP 86 120* 86    Insulin Requirements in the past 24 hours:  None- SSI has been discontinued and patient does not have insulin in TPN bag  Current Nutrition:  Clear liquids; Cyclic Clinimix E 5/20, total 1600 mL/12h: 50 ml/hr x 1hr then increase to 150 ml/hr x 10h then decrease to 50 ml/hr x 1hr  Home TPN- provided by [facility] (tele: [phone])  Formula per Viviann Spare, RPh:  78 g clinisol 15%, 312 g dextrose 70%, No Ca, 8 mEq Mag, 17 mEq KCl, 15 mmol KPhos, 15 mEq NaAcetate, 40 mEq NaCl, 50 g lipids MWF only, MWI MWF only, Trace elements (Addamel) daily.   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 Also noted to have h/o B PE now transitioned from heparin to Lovenox. Started to take in liquids PTA which lead to N/V. On admit CT revealed possible gastric leak with fluid collection that may be an abscess. Now s/p drain placement in IR 5/18. Meets criteria for severe malnutrition d/t 22% weight loss in 4 months and muscle and fat wasting  GI:  Drain output 23mL/24 hours-decreased; repeat I&D of area under umbilicus which had air worsening on CT on 5/22; intake of liquid diet up and down <1L/day total. note plans to send patient home on cyclic TPN + clear liquid diet- expected discharge date 5/25  Endo: no h/o DM. No insulin in TPN at home. SSI has been stopped due to minimal requirements  Lytes: Patient on LR. All lytes WNL, K 4.5  Renal: SCr <0.56 , CrCl ~20ml/min; UOP 1x  Hepatobil:  baseline prealbumin low 9.9, recheck dropped further to 7.7; TChole 86, trigs 61; 5/19 LFTs all normal except low albumin and slightly low TBili  Pulm: RA   Neuro: receiving PRN IV morphine and PRN Roxicet solution for pain- noted surgery's plan to send home when pain is more controlled  Cards: BP soft, HR nml- no meds   ID: afeb, wbc 7.7; abx for abscess which is s/p drainage- repeat drainage (5/22) 5/17: zosyn>>  5/18: micafungin>>   Best Practices: Lovenox for h/o PE- note platelets remain elevated  TPN Access: PICC prior to arrival for home TPN   TPN day#: chronic cyclic at home   Plan:  - Continue cyclic Clinimix  E 5/20, total 1600 mL/12h: 50 ml/hr x 1hr then increase to 150 ml/hr x 10h then decrease to 50 ml/hr x 1hr. This provides 80g of protein/day and an average of 1622kcal/day, meeting 100% of protein goal and 100% of kcal goal. - Provide MVI/trace, IV lipids MWF only due to national shortage  - will add 5mg  of zinc and 500mg  of vitamin c in daily TPN to aid with wound healing - Continue [facility]F at [facility] from 6p-6a while TNA hangs and 40mL/hr when TPN is off - spoke with case manager Darlin Drop yesterday evening after I had left for the day. Will fax TPN prescription to [facility] now in anticipation of discharge tomorrow.   Nishtha Raider D. Pavan Bring, PharmD Clinical Pharmacist Pager: [phone] 09/13/2012 8:40 AM

## 2012-09-13 NOTE — Progress Notes (Signed)
2 Days Post-Op  Subjective: Gastrocutaneous fistula Drain placed 5/18 To OR for I/D 5/22  Objective: Vital signs in last 24 hours: Temp:  [98 F (36.7 C)-98.4 F (36.9 C)] 98.4 F (36.9 C) (05/24 0520) Pulse Rate:  [76-87] 86 (05/24 0520) Resp:  [11-20] 17 (05/24 1002) BP: (97-117)/(59-91) 97/59 mmHg (05/24 0520) SpO2:  [94 %-100 %] 100 % (05/24 1002) FiO2 (%):  [100 %] 100 % (05/24 1002) Last BM Date: 09/10/12  Intake/Output from previous day: 05/23 0701 - 05/24 0700 In: 1335 [P.O.:780; IV Piggyback:550] Out: 2655 [Urine:2450; Drains:205] Intake/Output this shift: Total I/O In: -  Out: 75 [Drains:75]  PE:  Afeb; vss JP still intact output serous fluid- 205 cc yesterday; 75 cc today Site clean and dry  Lab Results:  No results found for this basename: WBC, HGB, HCT, PLT,  in the last 72 hours BMET  Recent Labs  09/12/12 0910  NA 139  K 4.5  CL 100  CO2 27  GLUCOSE 105*  BUN 19  CREATININE 0.56  CALCIUM 9.7   PT/INR No results found for this basename: LABPROT, INR,  in the last 72 hours ABG No results found for this basename: PHART, PCO2, PO2, HCO3,  in the last 72 hours  Studies/Results: No results found.  Anti-infectives:   Assessment/Plan: s/p Procedure(s): DEBRIDEMENT OF ABDOMINAL WALL ABSCESS (N/A)  GC fistula drain placed 5/18 To OT yesterday for I/D JP drain still intact Will follow Plan per [facility]   LOS: 7 days    Jameria Bradway A 09/13/2012

## 2012-09-14 MED ORDER — ASCORBIC ACID 500 MG/ML IJ SOLN
INTRAMUSCULAR | Status: AC
  Administered 2012-09-14 (×2): via INTRAVENOUS
  Filled 2012-09-14: qty 2000

## 2012-09-14 MED ORDER — OXYCODONE-ACETAMINOPHEN 5-325 MG/5ML PO SOLN: 5.0000 mL | ORAL | Status: DC | PRN

## 2012-09-14 MED ORDER — BARRIER CREAM NON-SPECIFIED: 1.0000 "application " | TOPICAL_CREAM | Freq: Two times a day (BID) | TOPICAL | Status: DC | PRN

## 2012-09-14 MED ORDER — MORPHINE SULFATE 2 MG/ML IJ SOLN
1.0000 mg | INTRAMUSCULAR | Status: DC | PRN
  Administered 2012-09-14 – 2012-09-18 (×14): 2 mg via INTRAVENOUS
  Filled 2012-09-14 (×14): qty 1

## 2012-09-14 MED ORDER — DIMETHICONE 1 % EX CREA
TOPICAL_CREAM | Freq: Two times a day (BID) | CUTANEOUS | Status: DC | PRN
  Filled 2012-09-14: qty 120

## 2012-09-14 MED ORDER — OXYCODONE HCL 5 MG/5ML PO SOLN
5.0000 mg | ORAL | Status: DC | PRN
  Administered 2012-09-14 – 2012-09-15 (×6): 20 mg via ORAL
  Administered 2012-09-16: 10 mg via ORAL
  Administered 2012-09-16 – 2012-09-17 (×4): 20 mg via ORAL
  Administered 2012-09-17 (×3): 10 mg via ORAL
  Administered 2012-09-18 (×2): 20 mg via ORAL
  Administered 2012-09-19: 10 mg via ORAL
  Filled 2012-09-14: qty 10
  Filled 2012-09-14 (×3): qty 20
  Filled 2012-09-14: qty 10
  Filled 2012-09-14 (×6): qty 20
  Filled 2012-09-14 (×3): qty 10
  Filled 2012-09-14: qty 20
  Filled 2012-09-14: qty 10
  Filled 2012-09-14 (×2): qty 20
  Filled 2012-09-14: qty 10

## 2012-09-14 NOTE — Progress Notes (Signed)
Patient ID: Cynthia Bowen, female   DOB: 01-25-1946, 67 y.o.   MRN: [id number] 3 Days Post-Op  Subjective: Pt c/o pain today, mainly burning of her skin where there is leakage.  Otherwise ok.  Objective: Vital signs in last 24 hours: Temp:  [97.9 F (36.6 C)-98.9 F (37.2 C)] 98.8 F (37.1 C) (05/25 0611) Pulse Rate:  [76-95] 95 (05/25 0611) Resp:  [16-18] 18 (05/25 0611) BP: (109-119)/(68-72) 109/68 mmHg (05/25 0611) SpO2:  [97 %-99 %] 99 % (05/25 0611) Last BM Date: 09/13/12  Intake/Output from previous day: 05/24 0701 - 05/25 0700 In: 10906.7 [P.O.:130; I.V.:7135; IV Piggyback:250; TPN:3376.7] Out: 2585 [Urine:2400; Drains:185] Intake/Output this shift: Total I/O In: -  Out: 300 [Urine:300]  PE: Abd: soft, drain with red-tinged clear output, red tinge likely from clear liquids, midline wound with drainage that still appears clear and not bilious.  Wound bed is still pale pink.  Lab Results:  No results found for this basename: WBC, HGB, HCT, PLT,  in the last 72 hours BMET  Recent Labs  09/12/12 0910  NA 139  K 4.5  CL 100  CO2 27  GLUCOSE 105*  BUN 19  CREATININE 0.56  CALCIUM 9.7   PT/INR No results found for this basename: LABPROT, INR,  in the last 72 hours CMP     Component Value Date/Time   NA 139 09/12/2012 0910   K 4.5 09/12/2012 0910   CL 100 09/12/2012 0910   CO2 27 09/12/2012 0910   GLUCOSE 105* 09/12/2012 0910   BUN 19 09/12/2012 0910   BUN 4 08/19/2009   CREATININE 0.56 09/12/2012 0910   CREATININE 0.90 08/20/2012 1526   CALCIUM 9.7 09/12/2012 0910   PROT 7.0 09/08/2012 0700   ALBUMIN 2.2* 09/08/2012 0700   AST 13 09/08/2012 0700   ALT 8 09/08/2012 0700   ALKPHOS 83 09/08/2012 0700   BILITOT 0.2* 09/08/2012 0700   GFRNONAA >90 09/12/2012 0910   GFRAA >90 09/12/2012 0910   Lipase     Component Value Date/Time   LIPASE 46 09/06/2012 0921       Studies/Results: No results found.  Anti-infectives: Anti-infectives   Start     Dose/Rate Route  Frequency Ordered Stop   09/08/12 0800  micafungin (MYCAMINE) 100 mg in sodium chloride 0.9 % 100 mL IVPB     100 mg 100 mL/hr over 1 Hours Intravenous Every 24 hours 09/06/12 1113     09/06/12 2000  piperacillin-tazobactam (ZOSYN) IVPB 3.375 g     3.375 g 12.5 mL/hr over 240 Minutes Intravenous Every 8 hours 09/06/12 1113     09/06/12 1445  piperacillin-tazobactam (ZOSYN) IVPB 3.375 g  Status:  Discontinued     3.375 g 12.5 mL/hr over 240 Minutes Intravenous 3 times per day 09/06/12 1436 09/06/12 1437   09/06/12 1200  micafungin (MYCAMINE) 100 mg in sodium chloride 0.9 % 100 mL IVPB     100 mg 100 mL/hr over 1 Hours Intravenous To Emergency Dept 09/06/12 1113 09/07/12 1200   09/06/12 1100  piperacillin-tazobactam (ZOSYN) IVPB 3.375 g     3.375 g 12.5 mL/hr over 240 Minutes Intravenous  Once 09/06/12 1050 09/06/12 1623       Assessment/Plan  1. GC fistula 2. Gastric leak controlled by drain 3. PCM/TNA  Plan: 1. Increase dressing changes to TID.  Still not enough drainage for pouching 2. Cont with drain for leak control 3. Cont cyclic TNA 4. Once pain controlled, can allow to go home on  clears and cyclic TNA 5. Adjust her pain meds.   LOS: 8 days    Cynthia Bowen E 09/14/2012, 10:05 AM Pager: [phone]

## 2012-09-14 NOTE — Progress Notes (Signed)
No change agree with PA assessment

## 2012-09-14 NOTE — Progress Notes (Signed)
PARENTERAL NUTRITION CONSULT NOTE - FOLLOW UP  Pharmacy Consult for Continuation of chronic home TPN Indication: GI leak, intra-abdominal abscess  Allergies  Allergen Reactions  . Avelox (Moxifloxacin Hcl In Nacl) Nausea And Vomiting  . Betadine (Povidone Iodine) Itching and Rash  . Alendronate Sodium Nausea And Vomiting  . Aspirin Nausea Only  . Codeine Nausea And Vomiting  . Doxycycline   . Fluconazole   . Hydrocodone Nausea And Vomiting    GI distress  . Neurontin (Gabapentin) Other (See Comments)    Mood changes   . Nsaids Other (See Comments)    Severe gastritis & perforation - avoid NSAIDs when possible  . Sertraline Hcl Other (See Comments)    Hallucinations   . Sulfa Antibiotics Rash    Patient Measurements: Height: 5\' 5"  (165.1 cm) Weight: 92 lb 12.8 oz (42.094 kg) IBW/kg (Calculated) : 57  Vital Signs: Temp: 98.8 F (37.1 C) (05/25 0611) Temp src: Oral (05/25 0611) BP: 109/68 mmHg (05/25 0611) Pulse Rate: 95 (05/25 0611) Intake/Output from previous day: 05/24 0701 - 05/25 0700 In: 10906.7 [P.O.:130; I.V.:7135; IV Piggyback:250; TPN:3376.7] Out: 2585 [Urine:2400; Drains:185] Intake/Output from this shift: Total I/O In: -  Out: 300 [Urine:300]  Labs: No results found for this basename: WBC, HGB, HCT, PLT, APTT, INR,  in the last 72 hours   Recent Labs  09/12/12 0910  NA 139  K 4.5  CL 100  CO2 27  GLUCOSE 105*  BUN 19  CREATININE 0.56  CALCIUM 9.7   Estimated Creatinine Clearance: 46.6 ml/min (by C-G formula based on Cr of 0.56).    Recent Labs  09/12/12 0718 09/12/12 1142 09/12/12 1725  GLUCAP 86 120* 86    Insulin Requirements in the past 24 hours:  None- SSI has been discontinued and patient does not have insulin in TPN bag  Current Nutrition:  Clear liquids; Cyclic Clinimix E 5/20, total 1600 mL/12h: 50 ml/hr x 1hr then increase to 150 ml/hr x 10h then decrease to 50 ml/hr x 1hr  Home TPN- provided by [facility] (tele: [phone]) Formula per Viviann Spare, RPh:  78 g clinisol 15%, 312 g dextrose 70%, No Ca, 8 mEq Mag, 17 mEq KCl, 15 mmol KPhos, 15 mEq NaAcetate, 40 mEq NaCl, 50 g lipids MWF only, MWI MWF only, Trace elements (Addamel) daily.   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 Also noted to have h/o B PE now transitioned from heparin to Lovenox. Started to take in liquids PTA which lead to N/V. On admit CT revealed possible gastric leak with fluid collection that may be an abscess. Now s/p drain placement in IR 5/18. Meets criteria for severe malnutrition d/t 22% weight loss in 4 months and muscle and fat wasting  GI:  Drain output 141mL/24 hours-decreased and drainage is clear; repeat I&D of area under umbilicus which had air worsening on CT on 5/22; intake of liquid diet up and down. Note plans to send patient home on cyclic TPN + clear liquid diet- expected discharge date 5/25- orders have been faxed to [facility]  Endo: no h/o DM. No insulin in TPN at home. SSI has been stopped due to minimal requirements  Lytes: Patient on LR. All lytes WNL, K 4.5  Renal: SCr <0.56 , CrCl ~71ml/min; UOP 1x  Hepatobil:  baseline prealbumin low 9.9, recheck dropped further to 7.7; TChole 86, trigs 61; 5/19 LFTs all normal except low albumin and slightly low TBili  Pulm: RA   Neuro: receiving PRN Roxicet solution for pain  Cards: BP soft, HR nml- no meds   ID: afeb, wbc 7.7; abx for abscess which is s/p drainage- repeat drainage (5/22) 5/17: zosyn>>  5/18: micafungin>>   Best Practices: Lovenox for h/o PE- note platelets remain elevated  TPN Access: PICC prior to arrival for home TPN   TPN day#: chronic cyclic at home   Plan:  - Continue cyclic Clinimix  E 5/20, total 1600 mL/12h: 50 ml/hr x 1hr then increase to 150 ml/hr x 10h then decrease to 50 ml/hr x 1hr. This provides 80g of protein/day and an average of 1622kcal/day, meeting 100% of protein goal and 100% of kcal goal. - Provide MVI/trace, IV lipids MWF only due to national shortage  - will add 5mg  of zinc and 500mg  of vitamin c in daily TPN to aid with wound healing - Continue [facility] at [facility] from 6p-6a while TNA hangs and 47mL/hr when TPN is off   Frederico Gerling D. Joenathan Sakuma, PharmD Clinical Pharmacist Pager: [phone] 09/14/2012 8:22 AM

## 2012-09-15 ENCOUNTER — Encounter: Payer: Self-pay | Admitting: Internal Medicine

## 2012-09-15 LAB — DIFFERENTIAL
Basophils Absolute: 0 10*3/uL (ref 0.0–0.1)
Basophils Relative: 0 % (ref 0–1)
Eosinophils Absolute: 0.1 10*3/uL (ref 0.0–0.7)
Lymphs Abs: 1.5 10*3/uL (ref 0.7–4.0)
Neutrophils Relative %: 82 % — ABNORMAL HIGH (ref 43–77)

## 2012-09-15 LAB — CBC
MCH: 26.1 pg (ref 26.0–34.0)
MCHC: 32.5 g/dL (ref 30.0–36.0)
Platelets: 526 10*3/uL — ABNORMAL HIGH (ref 150–400)
RBC: 3.6 MIL/uL — ABNORMAL LOW (ref 3.87–5.11)
RDW: 15.4 % (ref 11.5–15.5)

## 2012-09-15 LAB — COMPREHENSIVE METABOLIC PANEL
Alkaline Phosphatase: 84 U/L (ref 39–117)
BUN: 19 mg/dL (ref 6–23)
GFR calc Af Amer: >90 mL/min (ref >90)
Glucose, Bld: 128 mg/dL — ABNORMAL HIGH (ref 70–99)
Potassium: 3.9 mEq/L (ref 3.5–5.1)
Total Protein: 7.6 g/dL (ref 6.0–8.3)

## 2012-09-15 LAB — CHOLESTEROL, TOTAL: Cholesterol: 98 mg/dL (ref 0–200)

## 2012-09-15 LAB — MAGNESIUM: Magnesium: 1.8 mg/dL (ref 1.5–2.5)

## 2012-09-15 LAB — PREALBUMIN: Prealbumin: 11.7 mg/dL — ABNORMAL LOW (ref 17.0–34.0)

## 2012-09-15 MED ORDER — ZINC TRACE METAL 1 MG/ML IV SOLN
INTRAVENOUS | Status: AC
  Administered 2012-09-15: 18:00:00 via INTRAVENOUS
  Filled 2012-09-15: qty 2000

## 2012-09-15 MED ORDER — ALTEPLASE 2 MG IJ SOLR
2.0000 mg | Freq: Once | INTRAMUSCULAR | Status: AC
  Administered 2012-09-15: 2 mg
  Filled 2012-09-15: qty 2

## 2012-09-15 MED ORDER — JUVEN PO PACK
1.0000 | PACK | Freq: Two times a day (BID) | ORAL | Status: DC
  Administered 2012-09-15 – 2012-09-18 (×6): 1 via ORAL
  Filled 2012-09-15 (×10): qty 1

## 2012-09-15 MED ORDER — FAT EMULSION 20 % IV EMUL
250.0000 mL | INTRAVENOUS | Status: AC
  Administered 2012-09-15: 250 mL via INTRAVENOUS
  Filled 2012-09-15 (×2): qty 250

## 2012-09-15 NOTE — Progress Notes (Signed)
4 Days Post-Op   Assessment: s/p Procedure(s): DEBRIDEMENT OF ABDOMINAL WALL ABSCESS Patient Active Problem List   Diagnosis Date Noted  . 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 Hemoptysis 08/06/2012  . PE (pulmonary embolism) 08/05/2012  . Abdominal pain 08/05/2012  . Incarcerated epigastric hernia s/p primary repair 07/02/2012 07/11/2012  . Protein-calorie malnutrition, severe 07/11/2012  . 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 [id number] 07/10/2012  . Loss of weight 06/19/2012  . Anorexia 06/19/2012  . Unspecified gastritis and gastroduodenitis without mention of hemorrhage 04/18/2012  . Pericardial effusion 04/17/2012  . COPD (chronic obstructive pulmonary disease) 01/29/2012  . Community acquired pneumonia 12/29/2011  . Back pain 12/29/2011  . Depression 04/02/2011  . GERD (gastroesophageal reflux disease) 04/02/2011  . Osteoarthritis 04/02/2011  . Cervical neck pain with evidence of disc disease 04/02/2011  . Fibromyalgia 04/02/2011    Nothing new today  Plan: Continue current management  Subjective: Frustrated by all the issues since March, no new problems identified, tired  Objective: Vital signs in last 24 hours: Temp:  [98 F (36.7 C)-98.8 F (37.1 C)] 98.5 F (36.9 C) (05/26 0438) Pulse Rate:  [89-111] 95 (05/26 0438) Resp:  [16-18] 16 (05/26 0438) BP: (130-145)/(77-92) 130/77 mmHg (05/26 0438) SpO2:  [95 %-100 %] 95 % (05/26 0438)   Intake/Output from previous day: 05/25 0701 - 05/26 0700 In: 4430 [P.O.:700; IV Piggyback:150; TPN:3565] Out: 2190 [Urine:1800; Emesis/NG output:300; Drains:90]  General appearance: alert, cooperative and thin malnourished appearing GI: Soft, not distended, minimally tender, BS+   Incision: Dressing dry, but was just changed by RN  Lab Results:   Recent Labs  09/15/12 0630  WBC 13.3*  HGB 9.4*  HCT 28.9*  PLT 526*   BMET  Recent Labs  09/15/12 0630  NA 136  K 3.9  CL 99   CO2 26  GLUCOSE 128*  BUN 19  CREATININE 0.56  CALCIUM 9.6    MEDS, Scheduled . sodium chloride   Intravenous Q24H  . sodium chloride   Intravenous Q24H  . enoxaparin (LOVENOX) injection  60 mg Subcutaneous Q24H  . micafungin (MYCAMINE) IV  100 mg Intravenous Q24H  . nutrition supplement  1 packet Oral BID BM  . piperacillin-tazobactam (ZOSYN)  IV  3.375 g Intravenous Q8H  . sodium chloride  10-40 mL Intracatheter Q12H    Studies/Results: No results found.    LOS: 9 days     Currie Paris, MD, [facility], [place] [phone]   09/15/2012 11:44 AM

## 2012-09-15 NOTE — Progress Notes (Signed)
4 Days Post-Op  Subjective: abd drain placed 5/18 Intact- good output To OR 5/22 Pt feels better  Objective: Vital signs in last 24 hours: Temp:  [98 F (36.7 C)-98.8 F (37.1 C)] 98.5 F (36.9 C) (05/26 0438) Pulse Rate:  [89-111] 95 (05/26 0438) Resp:  [16-18] 16 (05/26 0438) BP: (130-145)/(77-92) 130/77 mmHg (05/26 0438) SpO2:  [95 %-100 %] 95 % (05/26 0438) Last BM Date: 09/14/12  Intake/Output from previous day: 05/25 0701 - 05/26 0700 In: 4430 [P.O.:700; IV Piggyback:150; TPN:3565] Out: 2190 [Urine:1800; Emesis/NG output:300; Drains:90] Intake/Output this shift:    Afeb; VSS Output of drain 90 cc yesterday 40 cc in JP- bloody serous Site clean and dry; tender Up in bed; eating some  Lab Results:   Recent Labs  09/15/12 0630  WBC 13.3*  HGB 9.4*  HCT 28.9*  PLT 526*   BMET  Recent Labs  09/15/12 0630  NA 136  K 3.9  CL 99  CO2 26  GLUCOSE 128*  BUN 19  CREATININE 0.56  CALCIUM 9.6   PT/INR No results found for this basename: LABPROT, INR,  in the last 72 hours ABG No results found for this basename: PHART, PCO2, PO2, HCO3,  in the last 72 hours  Studies/Results: No results found.  Anti-infectives:   Assessment/Plan: s/p Procedure(s): DEBRIDEMENT OF ABDOMINAL WALL ABSCESS (N/A)  Abd drain  Placed 5/18 GC abscess Output still significant Will follow Plan per [facility]   LOS: 9 days    Cynthia Bowen A 09/15/2012

## 2012-09-15 NOTE — Progress Notes (Signed)
PARENTERAL NUTRITION CONSULT NOTE - FOLLOW UP  Pharmacy Consult for Continuation of chronic home TPN Indication: GI leak, intra-abdominal abscess  Allergies  Allergen Reactions  . Avelox (Moxifloxacin Hcl In Nacl) Nausea And Vomiting  . Betadine (Povidone Iodine) Itching and Rash  . Alendronate Sodium Nausea And Vomiting  . Aspirin Nausea Only  . Codeine Nausea And Vomiting  . Doxycycline   . Fluconazole   . Hydrocodone Nausea And Vomiting    GI distress  . Neurontin (Gabapentin) Other (See Comments)    Mood changes   . Nsaids Other (See Comments)    Severe gastritis & perforation - avoid NSAIDs when possible  . Sertraline Hcl Other (See Comments)    Hallucinations   . Sulfa Antibiotics Rash    Patient Measurements: Height: 5\' 5"  (165.1 cm) Weight: 92 lb 12.8 oz (42.094 kg) IBW/kg (Calculated) : 57  Vital Signs: Temp: 98.5 F (36.9 C) (05/26 0438) Temp src: Oral (05/26 0438) BP: 130/77 mmHg (05/26 0438) Pulse Rate: 95 (05/26 0438) Intake/Output from previous day: 05/25 0701 - 05/26 0700 In: 4430 [P.O.:700; IV Piggyback:150; TPN:3565] Out: 2190 [Urine:1800; Emesis/NG output:300; Drains:90] Intake/Output from this shift:    Labs:  Recent Labs  09/15/12 0630  WBC 13.3*  HGB 9.4*  HCT 28.9*  PLT 526*     Recent Labs  09/12/12 0910 09/15/12 0630  NA 139 136  K 4.5 3.9  CL 100 99  CO2 27 26  GLUCOSE 105* 128*  BUN 19 19  CREATININE 0.56 0.56  CALCIUM 9.7 9.6  MG  --  1.8  PHOS  --  3.0  PROT  --  7.6  ALBUMIN  --  2.2*  AST  --  13  ALT  --  10  ALKPHOS  --  84  BILITOT  --  0.2*  TRIG  --  80  CHOL  --  98   Estimated Creatinine Clearance: 46.6 ml/min (by C-G formula based on Cr of 0.56).    Recent Labs  09/12/12 1142 09/12/12 1725  GLUCAP 120* 86    Insulin Requirements in the past 24 hours:  None- SSI has been discontinued and patient does not have insulin in TPN bag  Current Nutrition:  Clear liquids; Cyclic Clinimix E 5/20,  total 1600 mL/12h: 50 ml/hr x 1hr then increase to 150 ml/hr x 10h then decrease to 50 ml/hr x 1hr.  With cyclic lipids MWF, this provides 80g of protein/day and an average of 1622kcal/day, meeting 100% of protein goal and 100% of kcal goal.  Home TPN- provided by [facility] (tele: [phone]) Formula per Viviann Spare, RPh:  78 g clinisol 15%, 312 g dextrose 70%, No Ca, 8 mEq Mag, 17 mEq KCl, 15 mmol KPhos, 15 mEq NaAcetate, 40 mEq NaCl, 50 g lipids MWF only, MWI MWF only, Trace elements (Addamel) daily.   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 Also noted to have h/o B PE now transitioned from heparin to Lovenox. Started to take in liquids PTA which lead to N/V. On admit CT revealed possible gastric leak with fluid collection that may be an abscess. Now s/p drain placement in IR 5/18. Meets criteria for severe malnutrition d/t 22% weight loss in 4 months and muscle  and fat wasting.  GI:  Drain output 156mL/24 hours-decreased and drainage is clear; repeat I&D of area under umbilicus which had air worsening on CT on 5/22; intake of liquid diet up and down. Note plans to send patient home on cyclic TPN + clear liquid diet- expected discharge date 5/25- orders have been faxed to [facility]  Endo: no h/o DM. No insulin in TPN at home. SSI has been stopped due to minimal requirements  Lytes: Patient on LR. All lytes WNL, K 4.5  Renal: SCr stable, CrCl ~77ml/min  Hepatobil:  baseline prealbumin low 9.9, recheck dropped further to 7.7; TChole 86, trigs 61; 5/19 LFTs all normal except low albumin and slightly low TBili  Pulm: RA   Neuro: receiving PRN Roxicet solution for pain  Cards: BP soft, HR nml- no meds   ID: afeb, wbc inc to 13.3 today;  abx for abscess which is s/p drainage- repeat drainage (5/22)  5/17: zosyn>>  5/18: micafungin>>   Best Practices: Lovenox for h/o PE- note platelets remain elevated  TPN Access: PICC prior to arrival for home TPN   TPN day#: chronic cyclic at home   Plan:  - Continue cyclic Clinimix E 5/20, total 1600 mL/12h: 50 ml/hr x 1hr then increase to 150 ml/hr x 10h then decrease to 50 ml/hr x 1hr.  - Provide MVI/trace, IV lipids MWF only due to [employer]  - will add 5mg  of zinc and 500mg  of vitamin c in daily TPN to aid with wound healing - Continue IVF at [facility] from 6p-6a while TNA hangs and 84mL/hr when TPN is off - f/u d/c plans  Yared Susan L. Illene Bolus, PharmD, BCPS Clinical Pharmacist Pager: [phone] Pharmacy: [phone] 09/15/2012 8:58 AM

## 2012-09-15 NOTE — Progress Notes (Signed)
NUTRITION FOLLOW UP  Pt meets criteria for severe malnutrition in the context of chronic illness 2/2 weight loss of 22% body weight in 4 months and severe muscle and fat wasting per nutrition focused physical exam.  Intervention:   1. Continue TPN per pharmacy 2. Juven supplement po twice daily, each supplement provides 80 calories and 14 gm amino acids.   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 Ongoing   Goal:   TPN to meet >/=90% estimated nutrition needs.   Monitor:   PO intake, weight trends, labs  Assessment:   S/p placement on 5/18 and I&D on 5/22. Pt continues on TPN and now with clear liquids. Per RN, pt is tolerating clears well and is ready to try some supplements.  RD will add Juven.    Cyclic Clinimix E 5/20, total 1600 mL/12h: 50 ml/hr x 1hr then increase to 150 ml/hr x 10h then decrease to 50 ml/hr x 1hr. With cyclic lipids MWF, this provides 80g of protein/day and an average of 1622kcal/day, meeting 100% of protein goal and 100% of kcal goal  Height: Ht Readings from Last 1 Encounters:  09/06/12 5\' 5"  (1.651 m)    Weight Status:   Wt Readings from Last 1 Encounters:  09/06/12 92 lb 12.8 oz (42.094 kg)    Re-estimated needs:  Kcal: 1400-1600  Protein: 75-85 gm  Fluid: >/= 2 L daily with increased output from drain   Skin: drain, incision on abdomin  Diet Order: Clear Liquid   Intake/Output Summary (Last 24 hours) at 09/15/12 1054 Last data filed at 09/15/12 0500  Gross per 24 hour  Intake   4330 ml  Output   1890 ml  Net   2440 ml    Last BM: 5/25   Labs:   Recent Labs Lab 09/10/12 0555 09/12/12 0910 09/15/12 0630  NA 135 139 136  K 4.8 4.5 3.9  CL 100 100 99  CO2 28 27 26   BUN 18 19 19   CREATININE 0.47* 0.56 0.56  CALCIUM 9.0 9.7 9.6  MG 1.7  --  1.8  PHOS 2.9  --  3.0  GLUCOSE 82 105* 128*    CBG (last 3)   Recent Labs  09/12/12 1142 09/12/12 1725  GLUCAP 120* 86    Albumin  Date/Time Value Range Status  09/15/2012  6:30 AM 2.2* 3.5 - 5.2 g/dL Final  4/54/0981  1:91 AM 2.2* 3.5 - 5.2 g/dL Final  4/78/2956  2:13 AM 2.5* 3.5 - 5.2 g/dL Final    Scheduled Meds: . sodium chloride   Intravenous Q24H  . sodium chloride   Intravenous Q24H  . enoxaparin (LOVENOX) injection  60 mg Subcutaneous Q24H  . micafungin (MYCAMINE) IV  100 mg Intravenous Q24H  . piperacillin-tazobactam (ZOSYN)  IV  3.375 g Intravenous Q8H  . sodium chloride  10-40 mL Intracatheter Q12H    Continuous Infusions: . lactated ringers 50 mL/hr at 09/11/12 1055    Clarene Duke RD, LDN Pager [phone] After Hours pager [phone]

## 2012-09-15 NOTE — Progress Notes (Signed)
ANTICOAGULATION & ANTIBIOTIC CONSULT NOTE - Follow Up Consult  Pharmacy Consult for LMWH and Zosyn Indication: pulmonary embolus (hx 08/05/12); intra-abdominal abscess/gastrocutaneous fistula  Patient Measurements: Height: 5\' 5"  (165.1 cm) Weight: 92 lb 12.8 oz (42.094 kg) IBW/kg (Calculated) : 57 Heparin Dosing Weight: 42.1 kg  Vital Signs: Temp: 98.5 F (36.9 C) (05/26 0438) Temp src: Oral (05/26 0438) BP: 130/77 mmHg (05/26 0438) Pulse Rate: 95 (05/26 0438)  Labs:  Recent Labs  09/12/12 0910 09/15/12 0630  HGB  --  9.4*  HCT  --  28.9*  PLT  --  526*  CREATININE 0.56 0.56    Estimated Creatinine Clearance: 46.6 ml/min (by C-G formula based on Cr of 0.56).  Assessment: On full dose LMWH for PE on 08/05/12.  Back to OR 5/22 for I&D abd wall abscess and creation of gastrocutaneous fistula.  CBC stable today, no bleeding reported.   Day # 10 Zoysn and day # 9 Micafungin for intra-abdominal abscess/gastrocutaneous fistula (Fluconazole allergy). Afebrile, WBC inc to 13.3 today.  Creat stable >> CrCl ~45 ml/min.  No culture data.   Goal of Therapy:  Monitor platelets by anticoagulation protocol: Yes Appropriate Zosyn dose for renal function and infection   Plan:  1. Continue LMWH 60 mg sq q24 for full dose for PE  2.  Continue Zosyn 3.375 grams IV q8hrs (each infused over 4 hours).  Also on Micafungin 100 mg IV q24hrs  Dorien Bessent L. Illene Bolus, PharmD, BCPS Clinical Pharmacist Pager: [phone] Pharmacy: [phone] 09/15/2012 9:05 AM '

## 2012-09-15 NOTE — Progress Notes (Signed)
Follow-up visit with pt. As per charge nurse's recommendation. Pt had a flat affect and stated that she was frustrated with the fact that she did not get to see her surgeon since she had the surgery last week. Pt appeared to be in pain during our conversation. Pt stated that she wants to know what is going on with her stomach as the pain is still the same as before. Pt stated that the problem in her stomach was the result of a biopsy done on her stomach,and that she has been suffering from the effects of the biopsy. I encouraged the pt to continue requesting to see the doctor, and will inform the charge nurse of the pt's request. I will also spoke with the pt about the possibility of using aromatherapy to complement her pain medication and the pt stated that she is receptive to the idea. I will speak to the nurse director and the pt's nurse to determine pt's eligibility for aromatherapy. I shortened my visit as pt seemed to be in pain, and pt stated that a visit tomorrow would be better.   Sherol Dade Counselor Intern Haroldine Laws

## 2012-09-16 ENCOUNTER — Telehealth: Payer: Self-pay | Admitting: Radiology

## 2012-09-16 MED ORDER — FLUOXETINE HCL 20 MG/5ML PO SOLN
20.0000 mg | Freq: Every day | ORAL | Status: DC
  Administered 2012-09-16 – 2012-09-19 (×4): 20 mg via ORAL
  Filled 2012-09-16 (×4): qty 5

## 2012-09-16 MED ORDER — ZINC TRACE METAL 1 MG/ML IV SOLN
INTRAVENOUS | Status: AC
  Administered 2012-09-16: 18:00:00 via INTRAVENOUS
  Filled 2012-09-16: qty 2000

## 2012-09-16 NOTE — Telephone Encounter (Signed)
I spoke with Dr. [name].  He advised me that he started the patient back on Prozac today (liquid form).

## 2012-09-16 NOTE — Progress Notes (Signed)
5 Days Post-Op  Subjective: Patient still with dull painful sensation in epigastrium and burning at wound; minimal drainage Afebrile PICC line with some occlusion last night, currently is open and running   Objective: Vital signs in last 24 hours: Temp:  [97.7 F (36.5 C)-98.9 F (37.2 C)] 97.7 F (36.5 C) (05/27 0456) Pulse Rate:  [72-91] 91 (05/27 0456) Resp:  [16-18] 18 (05/27 0456) BP: (108-148)/(65-70) 139/69 mmHg (05/27 0456) SpO2:  [98 %-100 %] 100 % (05/27 0456) Last BM Date: 09/16/12  Intake/Output from previous day: 05/26 0701 - 05/27 0700 In: 6858.7 [P.O.:480; I.V.:6118.7; IV Piggyback:250] Out: 1505 [Urine:1350; Drains:155] Intake/Output this shift: Total I/O In: -  Out: 80 [Drains:80]  General appearance: alert, cooperative and no distress Cardio: regular rate and rhythm, S1, S2 normal, no murmur, click, rub or gallop GI: soft, mid-epigastric tenderness around wound; + BS Wound - beginning to granulate; minimal drainage; JP with some clear tan drainage  Lab Results:   Recent Labs  09/15/12 0630  WBC 13.3*  HGB 9.4*  HCT 28.9*  PLT 526*   BMET  Recent Labs  09/15/12 0630  NA 136  K 3.9  CL 99  CO2 26  GLUCOSE 128*  BUN 19  CREATININE 0.56  CALCIUM 9.6   PT/INR No results found for this basename: LABPROT, INR,  in the last 72 hours ABG No results found for this basename: PHART, PCO2, PO2, HCO3,  in the last 72 hours  Studies/Results: No results found.  Anti-infectives: Anti-infectives   Start     Dose/Rate Route Frequency Ordered Stop   09/08/12 0800  micafungin (MYCAMINE) 100 mg in sodium chloride 0.9 % 100 mL IVPB     100 mg 100 mL/hr over 1 Hours Intravenous Every 24 hours 09/06/12 1113     09/06/12 2000  piperacillin-tazobactam (ZOSYN) IVPB 3.375 g     3.375 g 12.5 mL/hr over 240 Minutes Intravenous Every 8 hours 09/06/12 1113     09/06/12 1445  piperacillin-tazobactam (ZOSYN) IVPB 3.375 g  Status:  Discontinued     3.375  g 12.5 mL/hr over 240 Minutes Intravenous 3 times per day 09/06/12 1436 09/06/12 1437   09/06/12 1200  micafungin (MYCAMINE) 100 mg in sodium chloride 0.9 % 100 mL IVPB     100 mg 100 mL/hr over 1 Hours Intravenous To Emergency Dept 09/06/12 1113 09/07/12 1200   09/06/12 1100  piperacillin-tazobactam (ZOSYN) IVPB 3.375 g     3.375 g 12.5 mL/hr over 240 Minutes Intravenous  Once 09/06/12 1050 09/06/12 1623      Assessment/Plan: s/p Procedure(s): DEBRIDEMENT OF ABDOMINAL WALL ABSCESS (N/A) Adjust pain meds/ restart Prozac at home dose Continue TNA/ abx/ dressing changes Will seek second opinion on her management from one of my partners Hopefully, her pain will improve, so we can get her home on TNA.  Once her nutrition improves, she will likely need definitive surgery     LOS: 10 days    Cynthia Kensinger K. 09/16/2012

## 2012-09-16 NOTE — Telephone Encounter (Signed)
error 

## 2012-09-16 NOTE — Telephone Encounter (Signed)
Case Manager with [facility] has called again regarding patients depression. Patient is still in hospital and is depressed. She is not taking any meds po at this time. The hospitalist is [name], he has been notified as well. Please advise, is there anything we can do while patient is hospitalized?

## 2012-09-16 NOTE — Progress Notes (Signed)
PARENTERAL NUTRITION CONSULT NOTE - FOLLOW UP  Pharmacy Consult for Continuation of chronic home TPN Indication: GI leak, intra-abdominal abscess  Allergies  Allergen Reactions  . Avelox (Moxifloxacin Hcl In Nacl) Nausea And Vomiting  . Betadine (Povidone Iodine) Itching and Rash  . Alendronate Sodium Nausea And Vomiting  . Aspirin Nausea Only  . Codeine Nausea And Vomiting  . Doxycycline   . Fluconazole   . Hydrocodone Nausea And Vomiting    GI distress  . Neurontin (Gabapentin) Other (See Comments)    Mood changes   . Nsaids Other (See Comments)    Severe gastritis & perforation - avoid NSAIDs when possible  . Sertraline Hcl Other (See Comments)    Hallucinations   . Sulfa Antibiotics Rash    Patient Measurements: Height: 5\' 5"  (165.1 cm) Weight: 92 lb 12.8 oz (42.094 kg) IBW/kg (Calculated) : 57  Vital Signs: Temp: 97.7 F (36.5 C) (05/27 0456) Temp src: Oral (05/27 0456) BP: 139/69 mmHg (05/27 0456) Pulse Rate: 91 (05/27 0456) Intake/Output from previous day: 05/26 0701 - 05/27 0700 In: 6858.7 [P.O.:480; I.V.:6118.7; IV Piggyback:250] Out: 1505 [Urine:1350; Drains:155] Intake/Output from this shift: Total I/O In: -  Out: 80 [Drains:80]  Labs:  Recent Labs  09/15/12 0630  WBC 13.3*  HGB 9.4*  HCT 28.9*  PLT 526*     Recent Labs  09/15/12 0630  NA 136  K 3.9  CL 99  CO2 26  GLUCOSE 128*  BUN 19  CREATININE 0.56  CALCIUM 9.6  MG 1.8  PHOS 3.0  PROT 7.6  ALBUMIN 2.2*  AST 13  ALT 10  ALKPHOS 84  BILITOT 0.2*  PREALBUMIN 11.7*  TRIG 80  CHOL 98   Estimated Creatinine Clearance: 46.6 ml/min (by C-G formula based on Cr of 0.56).   No results found for this basename: GLUCAP,  in the last 72 hours  Insulin Requirements in the past 24 hours:  None- SSI has been discontinued and patient does not have insulin in TPN bag  Current Nutrition:  Clear liquids, Juven powder BID; Cyclic Clinimix E 5/20, total 1600 mL/12h: 50 ml/hr x 1hr then  increase to 150 ml/hr x 10h then decrease to 50 ml/hr x 1hr.  With cyclic lipids MWF, this provides 80g of protein/day and an average of 1622kcal/day, meeting 100% of protein goal and 100% of kcal goal.  Home TPN- provided by [facility] (tele: [phone]) Formula per Viviann Spare, RPh:  78 g clinisol 15%, 312 g dextrose 70%, No Ca, 8 mEq Mag, 17 mEq KCl, 15 mmol KPhos, 15 mEq NaAcetate, 40 mEq NaCl, 50 g lipids MWF only, MWI MWF only, Trace elements (Addamel) daily.   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 Also noted to have h/o B PE now transitioned from heparin to Lovenox. Started to take in liquids PTA which lead to N/V. On admit CT revealed possible gastric leak with fluid collection that may be an abscess. Now s/p drain placement in IR 5/18. Meets criteria for severe malnutrition d/t 22% weight loss in 4 months and muscle and fat wasting.  GI:  Drain output 29mL/24 hours-decreased and drainage is clear; repeat I&D of area under umbilicus which had air worsening on CT on 5/22; intake of liquid  fair. Note plans to send patient home on cyclic TPN + clear liquid diet- orders have been faxed to [facility]. Delay in discharge d/t pain  Endo: no h/o DM. No insulin in TPN at home. SSI has been stopped due to minimal requirements  Lytes: Patient on LR. All lytes WNL, K 4.5  Renal: SCr stable, CrCl ~23ml/min  Hepatobil:  baseline prealbumin low 9.9, recheck dropped further to 7.7, but 3rd check increased to 11.7 (still below goal); TChole 98, trigs 80; LFTs all normal except low albumin and TBili  Pulm: RA   Neuro: receiving PRN Roxicet solution for pain; home Prozac restarted  Cards: VSS- no meds   ID: afeb, wbc 13.3; abx for abscess which  is s/p drainage- repeat drainage (5/22)  5/17: zosyn>>  5/18: micafungin>>   Best Practices: Lovenox for h/o PE- note platelets remain elevated  TPN Access: PICC prior to arrival for home TPN   TPN day#: chronic cyclic at home   Plan:  - Continue cyclic Clinimix E 5/20, total 1600 mL/12h: 50 ml/hr x 1hr then increase to 150 ml/hr x 10h then decrease to 50 ml/hr x 1hr.  - Provide MVI/trace, IV lipids MWF only due to [employer]  - will add 5mg  of zinc and 500mg  of vitamin c in daily TPN to aid with wound healing - Continue IVF at [facility] from 6p-6a while TNA hangs and 62mL/hr when TPN is off - f/u d/c plans and fax new orders if TPN changes before discharge  Cynthia Bowen D. Axton Cihlar, PharmD Clinical Pharmacist Pager: [phone] 09/16/2012 8:47 AM

## 2012-09-16 NOTE — Telephone Encounter (Signed)
Left another message, if she still has questions to call me back

## 2012-09-16 NOTE — Telephone Encounter (Signed)
Called case manager at [facility] after discussing with Chelle PA-C and Dr Merla Riches. Case manager Eber Jones will continue to try to contact Dr [name], to you FYI

## 2012-09-16 NOTE — Progress Notes (Signed)
Follow-up visit with pt. Pt was sitting up in her chair and appeared to be in good spirits. Pt asked if I had spoken to the nurse in charge of her case. Pt stated that she had spoken to the particular surgeon, and the  surgeons would have a team meeting and consult with the pt and her family. Pt stated that she is now experiencing less pain, and is waiting on news for next steps and discharge plans.   Sherol Dade Counselor Intern Haroldine Laws

## 2012-09-17 LAB — GLUCOSE, CAPILLARY
Glucose-Capillary: 100 mg/dL — ABNORMAL HIGH (ref 70–99)
Glucose-Capillary: 101 mg/dL — ABNORMAL HIGH (ref 70–99)

## 2012-09-17 MED ORDER — FAT EMULSION 20 % IV EMUL
240.0000 mL | INTRAVENOUS | Status: AC
  Administered 2012-09-17: 240 mL via INTRAVENOUS
  Filled 2012-09-17: qty 250

## 2012-09-17 MED ORDER — ZINC TRACE METAL 1 MG/ML IV SOLN
INTRAVENOUS | Status: AC
  Administered 2012-09-17: 17:00:00 via INTRAVENOUS
  Filled 2012-09-17: qty 2000

## 2012-09-17 MED ORDER — TRACE MINERALS CR-CU-F-FE-I-MN-MO-SE-ZN IV SOLN
INTRAVENOUS | Status: DC
  Filled 2012-09-17: qty 2000

## 2012-09-17 MED ORDER — FENTANYL 50 MCG/HR TD PT72
50.0000 ug | MEDICATED_PATCH | TRANSDERMAL | Status: DC
  Administered 2012-09-17 – 2012-09-19 (×2): 50 ug via TRANSDERMAL
  Filled 2012-09-17 (×2): qty 1

## 2012-09-17 MED ORDER — FAT EMULSION 20 % IV EMUL
240.0000 mL | INTRAVENOUS | Status: DC
  Filled 2012-09-17: qty 250

## 2012-09-17 NOTE — Progress Notes (Signed)
Patient ID: Cynthia Bowen, female   DOB: 16-Aug-1945, 67 y.o.   MRN: [id number] 6 Days Post-Op  Subjective: Patient still having incisional pain today but no other complaints. She is well known to me history of full-thickness antral biopsy. This was complicated by leak requiring reoperation and subsequent leak again in March treated with drainage antibiotics and bowel rest. She initially improved presented this hospitalization with a recurrent subhepatic abscess drained percutaneously and the fluid and air collection immediately beneath the incision that was opened and drained.  Objective: Vital signs in last 24 hours: Temp:  [98.8 F (37.1 C)] 98.8 F (37.1 C) (05/28 0540) Pulse Rate:  [82-92] 92 (05/28 0540) Resp:  [16-20] 20 (05/28 0540) BP: (111-129)/(73-78) 111/73 mmHg (05/28 0540) SpO2:  [98 %-99 %] 98 % (05/28 0540) Last BM Date: 09/17/12  Intake/Output from previous day: 05/27 0701 - 05/28 0700 In: 1105 [I.V.:1090] Out: 1006 [Urine:800; Drains:205; Stool:1] Intake/Output this shift: Total I/O In: -  Out: 600 [Urine:600]  General appearance: alert, cooperative and no distress GI: moderate tenderness around her open midline incision Incision/Wound: midline wound opened. Dressing just changed with reported minimal drainage currently appears very clean. There is a drainage catheter with cloudy bilious drainage  Lab Results:   Recent Labs  09/15/12 0630  WBC 13.3*  HGB 9.4*  HCT 28.9*  PLT 526*   BMET  Recent Labs  09/15/12 0630  NA 136  K 3.9  CL 99  CO2 26  GLUCOSE 128*  BUN 19  CREATININE 0.56  CALCIUM 9.6     Studies/Results: No results found.  Anti-infectives: Anti-infectives   Start     Dose/Rate Route Frequency Ordered Stop   09/08/12 0800  micafungin (MYCAMINE) 100 mg in sodium chloride 0.9 % 100 mL IVPB  Status:  Discontinued     100 mg 100 mL/hr over 1 Hours Intravenous Every 24 hours 09/06/12 1113 09/17/12 0750   09/06/12 2000   piperacillin-tazobactam (ZOSYN) IVPB 3.375 g  Status:  Discontinued     3.375 g 12.5 mL/hr over 240 Minutes Intravenous Every 8 hours 09/06/12 1113 09/17/12 0750   09/06/12 1445  piperacillin-tazobactam (ZOSYN) IVPB 3.375 g  Status:  Discontinued     3.375 g 12.5 mL/hr over 240 Minutes Intravenous 3 times per day 09/06/12 1436 09/06/12 1437   09/06/12 1200  micafungin (MYCAMINE) 100 mg in sodium chloride 0.9 % 100 mL IVPB     100 mg 100 mL/hr over 1 Hours Intravenous To Emergency Dept 09/06/12 1113 09/07/12 1200   09/06/12 1100  piperacillin-tazobactam (ZOSYN) IVPB 3.375 g     3.375 g 12.5 mL/hr over 240 Minutes Intravenous  Once 09/06/12 1050 09/06/12 1623      Assessment/Plan: s/p Procedure(s): DEBRIDEMENT OF ABDOMINAL WALL ABSCESS Currently patient is gradually improving status post percutaneous drainage of subhepatic abscess and open drainage of collection behind her anterior abdominal wall. I have reviewed her recent records and imaging. At this point think the best course is continued drainage and wound care with bowel rest and TNA which can be done cyclically at home with her symptoms can be controlled with oral medications. I think there is still a reasonable chance that this could eventually heal with conservative management. 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 However, I would not recommend this until the current inflammatory changes have resolved as much as possible and her nutritional status is better. This would likely be at  least several months. This was all discussed with the patient and she expressed understanding. Her family was not present and I offered to discuss this with them at their convenience.   LOS: 11 days    Aviannah Castoro T 09/17/2012

## 2012-09-17 NOTE — Progress Notes (Signed)
ANTICOAGULATION CONSULT NOTE - Follow Up Consult  Pharmacy Consult for Lovenox Indication: Hx recent pulmonary embolus (08/05/12)  Allergies  Allergen Reactions  . Avelox (Moxifloxacin Hcl In Nacl) Nausea And Vomiting  . Betadine (Povidone Iodine) Itching and Rash  . Alendronate Sodium Nausea And Vomiting  . Aspirin Nausea Only  . Codeine Nausea And Vomiting  . Doxycycline   . Fluconazole   . Hydrocodone Nausea And Vomiting    GI distress  . Neurontin (Gabapentin) Other (See Comments)    Mood changes   . Nsaids Other (See Comments)    Severe gastritis & perforation - avoid NSAIDs when possible  . Sertraline Hcl Other (See Comments)    Hallucinations   . Sulfa Antibiotics Rash    Patient Measurements: Height: 5\' 5"  (165.1 cm) Weight: 92 lb 12.8 oz (42.094 kg) IBW/kg (Calculated) : 57  Vital Signs: Temp: 98.8 F (37.1 C) (05/28 0540) Temp src: Oral (05/28 0540) BP: 111/73 mmHg (05/28 0540) Pulse Rate: 92 (05/28 0540)  Labs:  Recent Labs  09/15/12 0630  HGB 9.4*  HCT 28.9*  PLT 526*  CREATININE 0.56    Estimated Creatinine Clearance: 46.6 ml/min (by C-G formula based on Cr of 0.56).   Assessment: 67 y.o. F who continues on lovenox for anticoagulation in the setting of a recent PE on 08/05/12. No CBC today, Hgb/Hct/Plt appears stable from 5/26 labs, no overt s/sx of bleeding noted. CBC scheduled to be drawn 5/29 a.m -- will f/u at that time. Renal function remains stable -- no dose adjustment needed.  Goal of Therapy:  Anti-Xa level 0.6-1.2 units/ml 4hrs after LMWH dose given Monitor platelets by anticoagulation protocol: Yes   Plan:  1. Continue Lovenox 60 mg SQ every 24 hours 2. Will continue to monitor for any signs/symptoms of bleeding and will continue to follow renal function for any necessary dose adjustments  Georgina Pillion, PharmD, BCPS Clinical Pharmacist Pager: [phone] 09/17/2012 10:05 AM

## 2012-09-17 NOTE — Progress Notes (Signed)
PARENTERAL NUTRITION CONSULT NOTE - FOLLOW UP  Pharmacy Consult for Continuation of chronic home TPN Indication: GI leak, intra-abdominal abscess  Allergies  Allergen Reactions  . Avelox (Moxifloxacin Hcl In Nacl) Nausea And Vomiting  . Betadine (Povidone Iodine) Itching and Rash  . Alendronate Sodium Nausea And Vomiting  . Aspirin Nausea Only  . Codeine Nausea And Vomiting  . Doxycycline   . Fluconazole   . Hydrocodone Nausea And Vomiting    GI distress  . Neurontin (Gabapentin) Other (See Comments)    Mood changes   . Nsaids Other (See Comments)    Severe gastritis & perforation - avoid NSAIDs when possible  . Sertraline Hcl Other (See Comments)    Hallucinations   . Sulfa Antibiotics Rash    Patient Measurements: Height: 5\' 5"  (165.1 cm) Weight: 92 lb 12.8 oz (42.094 kg) IBW/kg (Calculated) : 57  Vital Signs: Temp: 98.8 F (37.1 C) (05/28 0540) Temp src: Oral (05/28 0540) BP: 111/73 mmHg (05/28 0540) Pulse Rate: 92 (05/28 0540) Intake/Output from previous day: 05/27 0701 - 05/28 0700 In: 1105 [I.V.:1090] Out: 1006 [Urine:800; Drains:205; Stool:1] Intake/Output from this shift: Total I/O In: -  Out: 600 [Urine:600]  Labs:  Recent Labs  09/15/12 0630  WBC 13.3*  HGB 9.4*  HCT 28.9*  PLT 526*     Recent Labs  09/15/12 0630  NA 136  K 3.9  CL 99  CO2 26  GLUCOSE 128*  BUN 19  CREATININE 0.56  CALCIUM 9.6  MG 1.8  PHOS 3.0  PROT 7.6  ALBUMIN 2.2*  AST 13  ALT 10  ALKPHOS 84  BILITOT 0.2*  PREALBUMIN 11.7*  TRIG 80  CHOL 98   Estimated Creatinine Clearance: 46.6 ml/min (by C-G formula based on Cr of 0.56).    Recent Labs  09/16/12 1440 09/17/12 0013 09/17/12 0751  GLUCAP 78 125* 100*    Insulin Requirements in the past 24 hours:  None- SSI has been discontinued and patient does not have insulin in TPN bag  Current Nutrition:  Clear liquids, Juven powder BID; Cyclic Clinimix E 5/20, total 1600 mL/12h: 50 ml/hr x 1hr then  increase to 150 ml/hr x 10h then decrease to 50 ml/hr x 1hr.  With cyclic lipids MWF, this provides 80g of protein/day and an average of 1622kcal/day, meeting 100% of protein goal and 100% of kcal goal.  Home TPN- provided by [facility] (tele: [phone]) Formula per Viviann Spare, RPh:  78 g clinisol 15%, 312 g dextrose 70%, No Ca, 8 mEq Mag, 17 mEq KCl, 15 mmol KPhos, 15 mEq NaAcetate, 40 mEq NaCl, 50 g lipids MWF only, MWI MWF only, Trace elements (Addamel) daily.   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 Also noted to have h/o B PE now transitioned from heparin to Lovenox. Started to take in liquids PTA which lead to N/V. On admit CT revealed possible gastric leak with fluid collection that may be an abscess. Now s/p drain placement in IR 5/18. Meets criteria for severe malnutrition d/t 22% weight loss in 4 months and muscle and fat wasting.  GI:  Drain output 246mL/24 hours; repeat I&D of area under umbilicus which had air worsening on CT on 5/22; intake of liquid fair. Note  plans to send patient home on cyclic TPN + clear liquid diet- orders have been faxed to [facility]. Delay in discharge d/t pain. Will need surgery when nutritional status is improved  Endo: no h/o DM. No insulin in TPN at home. SSI has been stopped due to minimal requirements  Lytes: Patient on LR. All lytes WNL, K 4.5 (no new labs since 5/26)  Renal: SCr stable, CrCl ~44ml/min  Hepatobil:  baseline prealbumin low 9.9, recheck dropped further to 7.7, but 3rd check increased to 11.7 (still below goal); TChole 98, trigs 80; LFTs all normal except low albumin and TBili  Pulm: RA   Neuro: home Prozac restarted, transitioned to Fentanyl patch for pain  Cards: VSS- no  meds   ID: afeb, wbc 13.3; abx for abscess which is s/p drainage- repeat drainage (5/22) 5/17: zosyn>> 5/28 5/18: micafungin>> 5/28  Best Practices: Lovenox for h/o PE- note platelets remain elevated  TPN Access: PICC prior to arrival for home TPN   TPN day#: chronic cyclic at home   Plan:  - Continue cyclic Clinimix E 5/20, total 1600 mL/12h: 50 ml/hr x 1hr then increase to 150 ml/hr x 10h then decrease to 50 ml/hr x 1hr.  - Provide MVI/trace, IV lipids MWF only due to [employer]  - will add 5mg  of zinc and 500mg  of vitamin c in daily TPN to aid with wound healing - Continue IVF at [facility] from 6p-6a while TNA hangs and 24mL/hr when TPN is off - f/u d/c plans and fax new orders if TPN changes before discharge - f/u tomorrow's TPN labs   Paulette Rockford D. Stephanie Mcglone, PharmD Clinical Pharmacist Pager: [phone] 09/17/2012 8:43 AM

## 2012-09-17 NOTE — Progress Notes (Signed)
Subjective: Pt feels ok. Perhaps a little more energy. Wants to go home.  Objective: Physical Exam: BP 111/73  Pulse 92  Temp(Src) 98.8 F (37.1 C) (Oral)  Resp 20  Ht 5\' 5"  (1.651 m)  Wt 92 lb 12.8 oz (42.094 kg)  BMI 15.44 kg/m2  SpO2 98% Midline wound/dressing intact RUQ drain intact, NT Mostly thin clear output with some debris varying from 100-265mL per day   Labs: CBC  Recent Labs  09/15/12 0630  WBC 13.3*  HGB 9.4*  HCT 28.9*  PLT 526*   BMET  Recent Labs  09/15/12 0630  NA 136  K 3.9  CL 99  CO2 26  GLUCOSE 128*  BUN 19  CREATININE 0.56  CALCIUM 9.6   LFT  Recent Labs  09/15/12 0630  PROT 7.6  ALBUMIN 2.2*  AST 13  ALT 10  ALKPHOS 84  BILITOT 0.2*   PT/INR No results found for this basename: LABPROT, INR,  in the last 72 hours   Studies/Results: No results found.  Assessment/Plan: S/p perc abscess drain/controlled gastrocutaneous fistula Plans per [facility], TNA, rest Will cont to follow while inpt.    LOS: 11 days    Brayton El PA-C 09/17/2012 9:55 AM

## 2012-09-17 NOTE — Progress Notes (Signed)
6 Days Post-Op  Subjective: Patient still complaining of pain localized around her wound, nausea Doesn't like the oxycodone elixir - thinks it is making her nauseated BM yesterday   Objective: Vital signs in last 24 hours: Temp:  [98.8 F (37.1 C)] 98.8 F (37.1 C) (05/28 0540) Pulse Rate:  [82-92] 92 (05/28 0540) Resp:  [16-20] 20 (05/28 0540) BP: (111-129)/(73-78) 111/73 mmHg (05/28 0540) SpO2:  [98 %-99 %] 98 % (05/28 0540) Last BM Date: 09/16/12  Intake/Output from previous day: 05/27 0701 - 05/28 0700 In: 1105 [I.V.:1090] Out: 1006 [Urine:800; Drains:205; Stool:1] Intake/Output this shift:    General appearance: alert, cooperative and no distress Resp: clear to auscultation bilaterally GI: soft, non-distended; drain output - more clear with some loose debris Wound - pale granulation tissue; no bilious drainage noted on gauze  Lab Results:   Recent Labs  09/15/12 0630  WBC 13.3*  HGB 9.4*  HCT 28.9*  PLT 526*   BMET  Recent Labs  09/15/12 0630  NA 136  K 3.9  CL 99  CO2 26  GLUCOSE 128*  BUN 19  CREATININE 0.56  CALCIUM 9.6   PT/INR No results found for this basename: LABPROT, INR,  in the last 72 hours ABG No results found for this basename: PHART, PCO2, PO2, HCO3,  in the last 72 hours  Studies/Results: No results found.  Anti-infectives: Anti-infectives   Start     Dose/Rate Route Frequency Ordered Stop   09/08/12 0800  micafungin (MYCAMINE) 100 mg in sodium chloride 0.9 % 100 mL IVPB     100 mg 100 mL/hr over 1 Hours Intravenous Every 24 hours 09/06/12 1113     09/06/12 2000  piperacillin-tazobactam (ZOSYN) IVPB 3.375 g     3.375 g 12.5 mL/hr over 240 Minutes Intravenous Every 8 hours 09/06/12 1113     09/06/12 1445  piperacillin-tazobactam (ZOSYN) IVPB 3.375 g  Status:  Discontinued     3.375 g 12.5 mL/hr over 240 Minutes Intravenous 3 times per day 09/06/12 1436 09/06/12 1437   09/06/12 1200  micafungin (MYCAMINE) 100 mg in sodium  chloride 0.9 % 100 mL IVPB     100 mg 100 mL/hr over 1 Hours Intravenous To Emergency Dept 09/06/12 1113 09/07/12 1200   09/06/12 1100  piperacillin-tazobactam (ZOSYN) IVPB 3.375 g     3.375 g 12.5 mL/hr over 240 Minutes Intravenous  Once 09/06/12 1050 09/06/12 1623      Assessment/Plan: s/p Procedure(s): DEBRIDEMENT OF ABDOMINAL WALL ABSCESS (N/A) Have spoken to a couple of my partners about reviewing her case and offering second opinions regarding her management.  I certainly understand her frustration at the slow pace of healing of the fistula.  Will change to Fentanyl patch for pain control to try to decrease the need for PO or IV pain meds  Nutrition slowly improving - Prealbumin 11.7 Will stop Micafungin/ Zosyn - has been about over 10 days.   LOS: 11 days    Cynthia Bowen K. 09/17/2012

## 2012-09-18 ENCOUNTER — Encounter (INDEPENDENT_AMBULATORY_CARE_PROVIDER_SITE_OTHER): Payer: Self-pay

## 2012-09-18 LAB — GLUCOSE, CAPILLARY
Glucose-Capillary: 87 mg/dL (ref 70–99)
Glucose-Capillary: 91 mg/dL (ref 70–99)
Glucose-Capillary: 75 mg/dL (ref 70–99)

## 2012-09-18 LAB — COMPREHENSIVE METABOLIC PANEL
ALT: 13 U/L (ref 0–35)
Albumin: 2.3 g/dL — ABNORMAL LOW (ref 3.5–5.2)
Alkaline Phosphatase: 87 U/L (ref 39–117)
Chloride: 98 mEq/L (ref 96–112)
GFR calc Af Amer: >90 mL/min (ref >90)
Glucose, Bld: 100 mg/dL — ABNORMAL HIGH (ref 70–99)
Potassium: 4.3 mEq/L (ref 3.5–5.1)
Sodium: 133 mEq/L — ABNORMAL LOW (ref 135–145)
Total Bilirubin: 0.1 mg/dL — ABNORMAL LOW (ref 0.3–1.2)
Total Protein: 7.4 g/dL (ref 6.0–8.3)

## 2012-09-18 LAB — CBC
Hemoglobin: 9.2 g/dL — ABNORMAL LOW (ref 12.0–15.0)
MCH: 25.8 pg — ABNORMAL LOW (ref 26.0–34.0)
MCHC: 32.1 g/dL (ref 30.0–36.0)
MCV: 80.6 fL (ref 78.0–100.0)
RBC: 3.56 MIL/uL — ABNORMAL LOW (ref 3.87–5.11)

## 2012-09-18 MED ORDER — ZINC TRACE METAL 1 MG/ML IV SOLN: INTRAVENOUS | Status: DC

## 2012-09-18 MED ORDER — ZINC TRACE METAL 1 MG/ML IV SOLN
INTRAVENOUS | Status: AC
  Administered 2012-09-18: 17:00:00 via INTRAVENOUS
  Filled 2012-09-18: qty 2000

## 2012-09-18 NOTE — Progress Notes (Signed)
Pt continues to progress- no active pain. Pt is resting. Will continue to monitor.

## 2012-09-18 NOTE — Progress Notes (Signed)
PARENTERAL NUTRITION CONSULT NOTE - FOLLOW UP  Pharmacy Consult for Continuation of chronic home TPN Indication: GI leak, intra-abdominal abscess  Allergies  Allergen Reactions  . Avelox (Moxifloxacin Hcl In Nacl) Nausea And Vomiting  . Betadine (Povidone Iodine) Itching and Rash  . Alendronate Sodium Nausea And Vomiting  . Aspirin Nausea Only  . Codeine Nausea And Vomiting  . Doxycycline   . Fluconazole   . Hydrocodone Nausea And Vomiting    GI distress  . Neurontin (Gabapentin) Other (See Comments)    Mood changes   . Nsaids Other (See Comments)    Severe gastritis & perforation - avoid NSAIDs when possible  . Sertraline Hcl Other (See Comments)    Hallucinations   . Sulfa Antibiotics Rash    Patient Measurements: Height: 5\' 5"  (165.1 cm) Weight: 92 lb 12.8 oz (42.094 kg) IBW/kg (Calculated) : 57  Vital Signs: Temp: 98.4 F (36.9 C) (05/29 0602) Temp src: Oral (05/29 0602) BP: 115/72 mmHg (05/29 0602) Pulse Rate: 86 (05/29 0602) Intake/Output from previous day: 05/28 0701 - 05/29 0700 In: 965 [P.O.:240; I.V.:710] Out: 1006 [Urine:825; Drains:180; Stool:1] Intake/Output from this shift:    Labs:  Recent Labs  09/18/12 0552  WBC 9.4  HGB 9.2*  HCT 28.7*  PLT 535*     Recent Labs  09/18/12 0552  NA 133*  K 4.3  CL 98  CO2 25  GLUCOSE 100*  BUN 21  CREATININE 0.44*  CALCIUM 9.6  MG 1.8  PHOS 3.1  PROT 7.4  ALBUMIN 2.3*  AST 16  ALT 13  ALKPHOS 87  BILITOT 0.1*   Estimated Creatinine Clearance: 46.6 ml/min (by C-G formula based on Cr of 0.44).    Recent Labs  09/17/12 2221 09/18/12 0557 09/18/12 0754  GLUCAP 125* 87 91    Insulin Requirements in the past 24 hours:  None- SSI has been discontinued and patient does not have insulin in TPN bag  Current Nutrition:  Clear liquids, Juven powder BID; Cyclic Clinimix E 5/20, total 1600 mL/12h: 50 ml/hr x 1hr then increase to 150 ml/hr x 10h then decrease to 50 ml/hr x 1hr.  With cyclic  lipids MWF, this provides 80g of protein/day and an average of 1622kcal/day, meeting 100% of protein goal and 100% of kcal goal.  Home TPN- provided by [facility] (tele: [phone]) Formula per Viviann Spare, RPh:  78 g clinisol 15%, 312 g dextrose 70%, No Ca, 8 mEq Mag, 17 mEq KCl, 15 mmol KPhos, 15 mEq NaAcetate, 40 mEq NaCl, 50 g lipids MWF only, MWI MWF only, Trace elements (Addamel) daily.   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 Also noted to have h/o B PE now transitioned from heparin to Lovenox. Started to take in liquids PTA which lead to N/V. On admit CT revealed possible gastric leak with fluid collection that may be an abscess. Now s/p drain placement in IR 5/18. Meets criteria for severe malnutrition d/t 22% weight loss in 4 months and muscle and fat wasting. Note plans for discharge tomorrow 5/30  GI:  Drain output 175mL/24 hours; repeat I&D of area under umbilicus which had air worsening on CT on 5/22; intake of liquid fair. Note plans to send patient home on cyclic TPN +  clear liquid diet- orders have been faxed to [facility]. Will need surgery when nutritional status is improved  Endo: no h/o DM. No insulin in TPN at home. SSI has been stopped due to minimal requirements  Lytes: K 4.3, Phos 3.1, Mag 1.8, Na dropped to 133.  Renal: SCr stable, CrCl ~34ml/min  Hepatobil:  baseline prealbumin low 9.9, recheck dropped further to 7.7, but 3rd check increased to 11.7 (still below goal); trigs 80; LFTs all normal except low albumin and TBili  Pulm: RA   Neuro: home Prozac restarted, transitioned to Fentanyl patch for pain and less nauseated  Cards: VSS- no meds   ID: afeb, wbc 13.3; abx for abscess which is s/p drainage- repeat  drainage (5/22) 5/17: zosyn>> 5/28 5/18: micafungin>> 5/28  Best Practices: Lovenox for h/o PE- note platelets remain elevated  TPN Access: PICC prior to arrival for home TPN   TPN day#: chronic cyclic at home   Plan:  - Continue cyclic Clinimix E 5/20, total 1600 mL/12h: 50 ml/hr x 1hr then increase to 150 ml/hr x 10h then decrease to 50 ml/hr x 1hr.  - Provide MVI/trace, IV lipids MWF only due to [employer]  - will add 5mg  of zinc and 500mg  of vitamin c in daily TPN to aid with wound healing - Continue IVF at [facility] from 6p-6a while TNA hangs and 36mL/hr when TPN is off - orders were faxed to [facility] on Saturday 5/24- no changes made since then   Sheva Mcdougle D. Jalah Warmuth, PharmD Clinical Pharmacist Pager: [phone] 09/18/2012 8:17 AM

## 2012-09-18 NOTE — Progress Notes (Signed)
7 Days Post-Op  Subjective: Patient much more comfortable, less nauseated with Fentanyl patch Appreciate Dr. Jamse Mead second opinion - it is does not appear that he recommends any change in plan Patient remains afebrile with normal WBC off of all antibiotics  Objective: Vital signs in last 24 hours: Temp:  [97.8 F (36.6 C)-99.1 F (37.3 C)] 98.4 F (36.9 C) (05/29 0602) Pulse Rate:  [86-95] 86 (05/29 0602) Resp:  [16-20] 20 (05/29 0602) BP: (115-128)/(72-75) 115/72 mmHg (05/29 0602) SpO2:  [98 %-99 %] 98 % (05/29 0602) Last BM Date: 09/17/12  Intake/Output from previous day: 05/28 0701 - 05/29 0700 In: 965 [P.O.:240; I.V.:710] Out: 1006 [Urine:825; Drains:180; Stool:1] Intake/Output this shift:    General appearance: alert, cooperative and no distress Resp: clear to auscultation bilaterally GI: minimal incisional tenderness; wound beginning to form more granulation tissue - minimal yellowish drainage Drain - thin serous fluid with small amount of debris  Lab Results:   Recent Labs  09/18/12 0552  WBC 9.4  HGB 9.2*  HCT 28.7*  PLT 535*   BMET  Recent Labs  09/18/12 0552  NA 133*  K 4.3  CL 98  CO2 25  GLUCOSE 100*  BUN 21  CREATININE 0.44*  CALCIUM 9.6   PT/INR No results found for this basename: LABPROT, INR,  in the last 72 hours ABG No results found for this basename: PHART, PCO2, PO2, HCO3,  in the last 72 hours  Studies/Results: No results found.  Anti-infectives: Anti-infectives   Start     Dose/Rate Route Frequency Ordered Stop   09/08/12 0800  micafungin (MYCAMINE) 100 mg in sodium chloride 0.9 % 100 mL IVPB  Status:  Discontinued     100 mg 100 mL/hr over 1 Hours Intravenous Every 24 hours 09/06/12 1113 09/17/12 0750   09/06/12 2000  piperacillin-tazobactam (ZOSYN) IVPB 3.375 g  Status:  Discontinued     3.375 g 12.5 mL/hr over 240 Minutes Intravenous Every 8 hours 09/06/12 1113 09/17/12 0750   09/06/12 1445  piperacillin-tazobactam  (ZOSYN) IVPB 3.375 g  Status:  Discontinued     3.375 g 12.5 mL/hr over 240 Minutes Intravenous 3 times per day 09/06/12 1436 09/06/12 1437   09/06/12 1200  micafungin (MYCAMINE) 100 mg in sodium chloride 0.9 % 100 mL IVPB     100 mg 100 mL/hr over 1 Hours Intravenous To Emergency Dept 09/06/12 1113 09/07/12 1200   09/06/12 1100  piperacillin-tazobactam (ZOSYN) IVPB 3.375 g     3.375 g 12.5 mL/hr over 240 Minutes Intravenous  Once 09/06/12 1050 09/06/12 1623      Assessment/Plan: s/p Procedure(s): DEBRIDEMENT OF ABDOMINAL WALL ABSCESS (N/A) Plan for discharge tomorrow Home cyclic TNA; Fentanyl patch; PO Prozac, PRN phenergan Home health for wound care/ drain care [facility] home infusion - for cyclic TNA  LOS: 12 days    [place] K. 09/18/2012

## 2012-09-19 ENCOUNTER — Telehealth (INDEPENDENT_AMBULATORY_CARE_PROVIDER_SITE_OTHER): Payer: Self-pay

## 2012-09-19 ENCOUNTER — Telehealth (INDEPENDENT_AMBULATORY_CARE_PROVIDER_SITE_OTHER): Payer: Self-pay | Admitting: General Surgery

## 2012-09-19 LAB — GLUCOSE, CAPILLARY

## 2012-09-19 MED ORDER — HEPARIN SOD (PORK) LOCK FLUSH 100 UNIT/ML IV SOLN
250.0000 [IU] | INTRAVENOUS | Status: DC | PRN
  Administered 2012-09-19: 500 [IU]

## 2012-09-19 MED ORDER — OXYCODONE HCL 5 MG/5ML PO SOLN: 5.0000 mg | ORAL | Status: DC | PRN

## 2012-09-19 MED ORDER — FLUOXETINE HCL 20 MG/5ML PO SOLN: 20.0000 mg | Freq: Every day | ORAL | Status: DC

## 2012-09-19 MED ORDER — FENTANYL 50 MCG/HR TD PT72: 1.0000 | MEDICATED_PATCH | TRANSDERMAL | Status: DC

## 2012-09-19 MED ORDER — DIPHENHYDRAMINE HCL 12.5 MG/5ML PO ELIX: 12.5000 mg | ORAL_SOLUTION | Freq: Four times a day (QID) | ORAL | Status: DC | PRN

## 2012-09-19 NOTE — Telephone Encounter (Signed)
Patient calling into office regarding her Lovenox prescription.  Patient states the cost of this medication is over $1000.00 and she cannot afford to pick it up.  Patient made aware that she has refills for the Lovenox and that she will need to continue taking this medication as directed by the ordering physician.  Per patient "I guess I will just die then, because I can't afford this medication"  Patient states she was told by our office that if she cannot afford the medication that we have a department here that will dispute with her insurance company and get the medication cost reduced for her.  I explained to the patient we do not have a department like this here, she became very upset with me and requested to speak with Dr. Fatima Sanger nurse about this matter.

## 2012-09-19 NOTE — Discharge Summary (Signed)
Physician Discharge Summary  Patient ID: Cynthia Bowen MRN: [id number] DOB/AGE: 1945-04-28 67 y.o.  Admit date: 09/06/2012 Discharge date: 09/19/2012  Admission Diagnoses:  Intra-abdominal abscess    Gastrocutaneous fistula    Severe protein calorie malnutrition  Discharge Diagnoses: Same Active Problems:   * No active hospital problems. 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  This was being managed conservatively with TNA, drain, and minimal PO intake.  The drain came out and the fistula dried up and closed.  She was restarted on clear liquids, but presented to the ED with signs of increasing abdominal pain and fever.  She was found to have another intra-abdominal abscess.  This was drained percutaneously.  She then developed swelling and tenderness at her midline incision, which had previously been completely healed.  This was drained in the operating room, with minimal bilious drainage.  This wound has begun to granulate with minimal drainage.  Her drain has drained some thin serous fluid with some fatty debris.  Her WBC and temperature have remained normal off of antibiotics after a 10 day course.  Her TNA is being cycled at night.  Her pain control was an issue for awhile, but we have discovered that a Fentanyl patch has been very successful in controlling her pain.  She has restarted her Prozac.  Consults: None  Significant Diagnostic Studies: radiology: CT scan: intra-abdominal abscess/ abscess at midline incision  Treatments: Percutaneous drain of abscess, OR debridement of midline wound, cyclic TNA via PICC line  Discharge Exam: Blood pressure 113/73, pulse 94, temperature 97.9 F (36.6 C), temperature source Oral, resp. rate 16, height 5\' 5"  (1.651 m), weight 92 lb 12.8 oz (42.094 kg), SpO2 98.00%. General appearance: alert, cooperative and excellent spirits Resp: clear to  auscultation bilaterally GI: soft, non-tender; bowel sounds normal; no masses,  no organomegaly Wound - beginning to fill with granulation tissue; minimal drainage  Disposition: 01-Home or Self Care Home Health nursing for drain care/ dressing changes Home infusion services for cyclic TNA  Discharge Orders   Future Appointments Provider Department Dept Phone   10/17/2012 1:15 PM Tonye Pearson, MD [facility] [phone]   Future Orders Complete By Expires     Call MD for:  persistant nausea and vomiting  As directed     Call MD for:  redness, tenderness, or signs of infection (pain, swelling, redness, odor or green/yellow discharge around incision site)  As directed     Call MD for:  severe uncontrolled pain  As directed     Call MD for:  temperature >100.4  As directed     Diet general  As directed     Discharge instructions  As directed     Comments:      Home infusion services for cyclic TNA - has been arranged.    Discharge wound care:  As directed     Comments:      BID wet to dry dressings to midline wound - HHRN arranged.  May teach patient and family to perform dressing changes.    Driving Restrictions  As directed     Comments:      Do not drive while taking pain medications    Increase activity slowly  As directed     May shower / Bathe  As directed     May walk up steps  As directed  Medication List    STOP taking these medications       HYDROcodone-acetaminophen 7.5-325 mg/15 ml solution  Commonly known as:  HYCET      TAKE these medications       albuterol 108 (90 BASE) MCG/ACT inhaler  Commonly known as:  PROVENTIL HFA;VENTOLIN HFA  Inhale 2 puffs into the lungs every 4 (four) hours as needed for wheezing (cough, shortness of breath or wheezing.).     diphenhydrAMINE 12.5 MG/5ML elixir  Commonly known as:  BENADRYL  Take 5 mLs (12.5 mg total) by mouth every 6 (six) hours as needed for itching.     diphenhydrAMINE 12.5 MG/5ML  liquid  Commonly known as:  BENADRYL  Place 12.5 mg into feeding tube 4 (four) times daily as needed for itching or allergies.     enoxaparin 60 MG/0.6ML injection  Commonly known as:  LOVENOX  Inject 0.45 mLs (45 mg total) into the skin every 12 (twelve) hours.     EPINEPHrine 0.3 mg/0.3 mL Devi  Commonly known as:  EPIPEN  Inject 0.3 mLs (0.3 mg total) into the muscle once.     fentaNYL 50 MCG/HR  Commonly known as:  DURAGESIC - dosed mcg/hr  Place 1 patch (50 mcg total) onto the skin every 3 (three) days.     FLUoxetine 20 MG/5ML solution  Commonly known as:  PROZAC  Take 5 mLs (20 mg total) by mouth daily.     oxyCODONE 5 MG/5ML solution  Commonly known as:  ROXICODONE  Take 5-20 mLs (5-20 mg total) by mouth every 4 (four) hours as needed.           Follow-up Information   Follow up with Wynona Luna., MD. Schedule an appointment as soon as possible for a visit in 2 weeks.   Contact information:   [address] [place] [address] [phone]       Signed: Wynona Luna. 09/19/2012, 8:30 AM

## 2012-09-19 NOTE — Telephone Encounter (Signed)
Called patient back to talk to her to see what she needed [place] to do her at the office about her Lovenox Rx, she stated that Dr [name] told her that we have someone here to help her with the dispute with her insurance company and get the medication cost reduced for her. I talked to Dr [name] and he wants me to talk to Joyce Gross or someone in the bill dept to see if we can help her with the insurance. Patient will need to be on Lovenox per Dr [name]. I told the patient to come by on Monday and make sure that she gives me the paper work and I can see what I can do for her. She stated that she has 7 days left of her Lovenox

## 2012-09-19 NOTE — Progress Notes (Signed)
NURSING PROGRESS NOTE  Cynthia Bowen [id number] Discharge Data: 09/19/2012 11:10 AM Attending Provider: [name]. [name], MD JYN:[id number], [name], MD     Darron Doom to be D/C'd Home per MD order.    All IV's discontinued with no bleeding noted.  All belongings returned to patient for patient to take home.   Last Vital Signs:  Blood pressure 113/73, pulse 94, temperature 97.9 F (36.6 C), temperature source Oral, resp. rate 16, height 5\' 5"  (1.651 m), weight 42.094 kg (92 lb 12.8 oz), SpO2 98.00%.  Discharge Medication List   Medication List    STOP taking these medications       HYDROcodone-acetaminophen 7.5-325 mg/15 ml solution  Commonly known as:  HYCET      TAKE these medications       albuterol 108 (90 BASE) MCG/ACT inhaler  Commonly known as:  PROVENTIL HFA;VENTOLIN HFA  Inhale 2 puffs into the lungs every 4 (four) hours as needed for wheezing (cough, shortness of breath or wheezing.).     diphenhydrAMINE 12.5 MG/5ML elixir  Commonly known as:  BENADRYL  Take 5 mLs (12.5 mg total) by mouth every 6 (six) hours as needed for itching.     diphenhydrAMINE 12.5 MG/5ML liquid  Commonly known as:  BENADRYL  Place 12.5 mg into feeding tube 4 (four) times daily as needed for itching or allergies.     enoxaparin 60 MG/0.6ML injection  Commonly known as:  LOVENOX  Inject 0.45 mLs (45 mg total) into the skin every 12 (twelve) hours.     EPINEPHrine 0.3 mg/0.3 mL Devi  Commonly known as:  EPIPEN  Inject 0.3 mLs (0.3 mg total) into the muscle once.     fentaNYL 50 MCG/HR  Commonly known as:  DURAGESIC - dosed mcg/hr  Place 1 patch (50 mcg total) onto the skin every 3 (three) days.     FLUoxetine 20 MG/5ML solution  Commonly known as:  PROZAC  Take 5 mLs (20 mg total) by mouth daily.     oxyCODONE 5 MG/5ML solution  Commonly known as:  ROXICODONE  Take 5-20 mLs (5-20 mg total) by mouth every 4 (four) hours as needed.        Madelin Rear, MSN, RN,  [employer]

## 2012-09-22 ENCOUNTER — Telehealth (INDEPENDENT_AMBULATORY_CARE_PROVIDER_SITE_OTHER): Payer: Self-pay | Admitting: General Surgery

## 2012-09-22 ENCOUNTER — Encounter (INDEPENDENT_AMBULATORY_CARE_PROVIDER_SITE_OTHER): Payer: Self-pay | Admitting: Surgery

## 2012-09-22 NOTE — Telephone Encounter (Signed)
Called patient to check to see if she called and talked to [facility] about her Lovenox Rx, and she stated that she did and she will get her Rx tomorrow 09-23-12 for the Lovenox and she stated that it will cost her 95 dollars and she can afford that payment. She will going to the [facility] on [address] and I asked her can put the new pharmacy in Epic so if we need to do any more Rx we can sent it to [facility] of [facility] and she agree to that.

## 2012-09-22 NOTE — Telephone Encounter (Signed)
LMOM for patient to cal back and ask for [facility]. I wanted to check on Cynthia Bowen to see if she went to [facility] to get her Rx of Lovenox filled.

## 2012-09-25 ENCOUNTER — Other Ambulatory Visit (INDEPENDENT_AMBULATORY_CARE_PROVIDER_SITE_OTHER): Payer: Self-pay | Admitting: Surgery

## 2012-09-25 ENCOUNTER — Telehealth (INDEPENDENT_AMBULATORY_CARE_PROVIDER_SITE_OTHER): Payer: Self-pay | Admitting: General Surgery

## 2012-09-25 DIAGNOSIS — I2699 Other pulmonary embolism without acute cor pulmonale: Secondary | ICD-10-CM

## 2012-09-25 MED ORDER — RIVAROXABAN 20 MG PO TABS: 20.0000 mg | ORAL_TABLET | Freq: Every day | ORAL | Status: DC

## 2012-09-25 NOTE — Telephone Encounter (Signed)
Called Ms. Stebbins to tell her that we sent through epic a Rx for Xarelto 20 mg  With 3 ordered at the [facility] on [facility] and patient was also was told to finish the last of her lovenox shot and start the Xarelto today also 09-25-12 per Dr [name]

## 2012-09-26 ENCOUNTER — Encounter (INDEPENDENT_AMBULATORY_CARE_PROVIDER_SITE_OTHER): Payer: Self-pay

## 2012-09-26 ENCOUNTER — Telehealth (INDEPENDENT_AMBULATORY_CARE_PROVIDER_SITE_OTHER): Payer: Self-pay | Admitting: General Surgery

## 2012-09-26 NOTE — Telephone Encounter (Signed)
She is starting xarelto and asked about getting heparin as well with her picc line flushes.  I told her to take the medication as prescribed by Dr. [name] and I think that a small heparin flush should be okay but wouldn't want to combine them in larger doses.

## 2012-10-01 ENCOUNTER — Encounter (INDEPENDENT_AMBULATORY_CARE_PROVIDER_SITE_OTHER): Payer: Self-pay | Admitting: Surgery

## 2012-10-01 ENCOUNTER — Ambulatory Visit (INDEPENDENT_AMBULATORY_CARE_PROVIDER_SITE_OTHER): Payer: Medicare Other | Admitting: Surgery

## 2012-10-01 VITALS — BP 90/60 | HR 87 | Temp 97.6°F | Resp 16 | Ht 65.0 in | Wt 98.6 lb

## 2012-10-01 DIAGNOSIS — K316 Fistula of stomach and duodenum: Secondary | ICD-10-CM

## 2012-10-01 MED ORDER — ONDANSETRON 4 MG PO TBDP: 4.0000 mg | ORAL_TABLET | Freq: Three times a day (TID) | ORAL | Status: DC | PRN

## 2012-10-01 MED ORDER — OXYCODONE-ACETAMINOPHEN 5-325 MG PO TABS: 1.0000 | ORAL_TABLET | ORAL | Status: DC | PRN

## 2012-10-01 NOTE — Progress Notes (Signed)
The patient is seen for followup after recent hospitalization. She had developed a subhepatic abscess which was percutaneously drained. She also had surgical incision and drainage of an abscess in her midline incision. There was concern that her gastrocutaneous fistula was draining through her midline wound. However this has healed completely since discharge from the hospital. This is very promising as this shows that she has some level of adequate nutrition. She continues to have some drainage from her percutaneous drain. There is also some drainage around the insertion site that is causing some skin irritation and burning. She is tolerating clear liquids but continues to have some nausea. The fentanyl patches are working well for her pain with minimal need for additional pain medication.  Filed Vitals:   10/01/12 1543  BP: 90/60  Pulse: 87  Temp: 97.6 F (36.4 C)  Resp: 16   Her abdomen is soft and nondistended with good bowel sounds. Her midline wound is healed with no sign of infection. The right upper quadrant percutaneous drain has some skin erythema surrounding the insertion site. The drain has some serosanguineous fluid but no sign of stool or bile.  Labs: WBC 10.0 Hgb 10.8 Plts 611  Na 135 K 4.6 Cl 99  CO2 22 Alb 3.4 Tbili 0.1 AST 20 ALT 13 Prealbumin pending.  Imp:  Chronic gastrocutaneous fistula - healed wound after debridement for wound infection  Plan:  Advance diet as tolerated Continue pain regimen with Fentanyl patches, PRN Oxycodone, PRN Zofran If wound and drain continue to improve at next visit in 2 weeks, will repeat gastrografin swallow to rule out persistent leak.  [name]. [name], MD, [facility]  General/ Trauma Surgery  10/01/2012 5:22 PM

## 2012-10-06 ENCOUNTER — Encounter (INDEPENDENT_AMBULATORY_CARE_PROVIDER_SITE_OTHER): Payer: Self-pay

## 2012-10-07 ENCOUNTER — Other Ambulatory Visit (HOSPITAL_COMMUNITY): Payer: Self-pay | Admitting: Surgery

## 2012-10-08 NOTE — Telephone Encounter (Signed)
Can this patient have this Rx 

## 2012-10-09 ENCOUNTER — Ambulatory Visit (INDEPENDENT_AMBULATORY_CARE_PROVIDER_SITE_OTHER): Payer: Medicare Other | Admitting: General Surgery

## 2012-10-09 ENCOUNTER — Encounter (INDEPENDENT_AMBULATORY_CARE_PROVIDER_SITE_OTHER): Payer: Self-pay | Admitting: General Surgery

## 2012-10-09 ENCOUNTER — Telehealth (INDEPENDENT_AMBULATORY_CARE_PROVIDER_SITE_OTHER): Payer: Self-pay | Admitting: *Deleted

## 2012-10-09 VITALS — BP 110/62 | HR 94 | Resp 16 | Ht 65.0 in | Wt 96.6 lb

## 2012-10-09 DIAGNOSIS — K316 Fistula of stomach and duodenum: Secondary | ICD-10-CM

## 2012-10-09 NOTE — Telephone Encounter (Signed)
Patient called in today to report that her incision site looks worse and increased drainage. Patient scheduled for urgent office this afternoon.

## 2012-10-09 NOTE — Progress Notes (Signed)
Subjective:     Patient ID: Cynthia Bowen, female   DOB: 10/23/1945, 67 y.o.   MRN: [id number]  HPI Patient history of gastrocutaneous fistula. Chest is an abscess which was percutaneously drained. She is on home TNA. She had been doing better with her wound healed up on the last visit with Dr. [name]. She comes to urgent clinic today Because fluid has begun draining from her wound again. The skin is irritated.  Review of Systems     Objective:   Physical Exam Abdomen soft. Central portion of her upper midline incision has frank drainage of gastric contents and air. No evidence of undrained collection or abscess. Some skin irritation is present. Percutaneous drain remains in the right upper quadrant. Some skin irritation is present there but no subcutaneous fluctuance or cellulitis    Assessment:     Persistent or recurrent gastrocutaneous fistula    Plan:     An ostomy appliance was fashioned to drain her fistula area and protect the skin. She will see Dr. [name] tomorrow. Supplies were given to the patient.

## 2012-10-10 ENCOUNTER — Telehealth: Payer: Self-pay

## 2012-10-10 ENCOUNTER — Ambulatory Visit (INDEPENDENT_AMBULATORY_CARE_PROVIDER_SITE_OTHER): Payer: Medicare Other | Admitting: Surgery

## 2012-10-10 ENCOUNTER — Encounter (INDEPENDENT_AMBULATORY_CARE_PROVIDER_SITE_OTHER): Payer: Self-pay | Admitting: Surgery

## 2012-10-10 VITALS — BP 130/68 | HR 109 | Temp 98.0°F | Ht 65.0 in | Wt 97.2 lb

## 2012-10-10 DIAGNOSIS — K316 Fistula of stomach and duodenum: Secondary | ICD-10-CM

## 2012-10-10 MED ORDER — AMOXICILLIN-POT CLAVULANATE 875-125 MG PO TABS: 1.0000 | ORAL_TABLET | Freq: Two times a day (BID) | ORAL | Status: AC

## 2012-10-10 NOTE — Telephone Encounter (Signed)
Telephone call to patient reminding patient appointment with Dr. Merla Riches was moved from 6/25 to 6/27 at 1:15pm.  Patient verbalized understanding.

## 2012-10-10 NOTE — Progress Notes (Signed)
WBC 12.3 Hgb 10.8 Plts 696 Electrolytes WNL Alb - 3.5 Prealb - 13  She continues to have a lot of drainage from her midline wound. This appears to be food product. She has a lot of tenderness around her drain site.  She continues on TNA.    Her abdomen remained soft. The upper midline incision has some gastric contents. This seems to be collecting well and the ostomy appliance. The percutaneous drain has some surrounding cellulitis and skin irritation.  Persistent gastrocutaneous fistula We will obtain a CT scan to make sure that there are no undrained fluid collections inside the abdomen. We will start her on Augmentin to try to treat the redness around her drain site. Her nutrition is slowly improving. I related this is a very frustrating and slow-moving process. Hopefully her nutrition will improve to the point that she can undergo a partial gastrectomy safely. I encouraged her to limit her by mouth intake is much possible. Continue TNA for now.  We will speak with her after her CT scan.  Cynthia Bowen. [name], MD, [facility] Surgery  General/ Trauma Surgery  10/10/2012 1:30 PM

## 2012-10-12 ENCOUNTER — Telehealth (INDEPENDENT_AMBULATORY_CARE_PROVIDER_SITE_OTHER): Payer: Self-pay | Admitting: Surgery

## 2012-10-12 NOTE — Telephone Encounter (Signed)
Pt called noting some bleeding at her wound.  Not at the drain.  She could not explain what the wound was...  I looked up to note that she has a GC fistula.  Eating clears OK & on TNA.  No emesis, N/V, worsening abd pain.    She just wanted to let us now.  She knows to get her CT scan tomorrow.    I recommended an ice pack to hold pressure.  Call us in the morning.  Go to ED if worse.  She expressed understanding & appreciation

## 2012-10-13 ENCOUNTER — Ambulatory Visit
Admission: RE | Admit: 2012-10-13 | Discharge: 2012-10-13 | Disposition: A | Discharge status: Home / Self Care | Payer: Medicare Other | Source: Ambulatory Visit | Attending: Surgery | Admitting: Surgery

## 2012-10-13 DIAGNOSIS — K316 Fistula of stomach and duodenum: Secondary | ICD-10-CM

## 2012-10-13 MED ORDER — IOHEXOL 300 MG/ML  SOLN
80.0000 mL | Freq: Once | INTRAMUSCULAR | Status: AC | PRN
  Administered 2012-10-13: 80 mL via INTRAVENOUS

## 2012-10-14 ENCOUNTER — Ambulatory Visit (INDEPENDENT_AMBULATORY_CARE_PROVIDER_SITE_OTHER): Payer: Medicare Other | Admitting: Surgery

## 2012-10-14 ENCOUNTER — Encounter (INDEPENDENT_AMBULATORY_CARE_PROVIDER_SITE_OTHER): Payer: Medicare Other | Admitting: Surgery

## 2012-10-14 ENCOUNTER — Other Ambulatory Visit (INDEPENDENT_AMBULATORY_CARE_PROVIDER_SITE_OTHER): Payer: Self-pay | Admitting: Surgery

## 2012-10-14 ENCOUNTER — Encounter (INDEPENDENT_AMBULATORY_CARE_PROVIDER_SITE_OTHER): Payer: Self-pay | Admitting: Surgery

## 2012-10-14 VITALS — BP 90/58 | HR 80 | Temp 97.6°F | Resp 15 | Ht 65.0 in | Wt 97.0 lb

## 2012-10-14 DIAGNOSIS — Z5189 Encounter for other specified aftercare: Secondary | ICD-10-CM

## 2012-10-14 DIAGNOSIS — K316 Fistula of stomach and duodenum: Secondary | ICD-10-CM

## 2012-10-14 DIAGNOSIS — E43 Unspecified severe protein-calorie malnutrition: Secondary | ICD-10-CM

## 2012-10-14 MED ORDER — OXYCODONE HCL 5 MG/5ML PO SOLN: 5.0000 mg | ORAL | Status: DC | PRN

## 2012-10-14 NOTE — Progress Notes (Signed)
S/p CT scan showing no remaining subhepatic abscess and persistent gastrocutaneous fistula.  She states that she had some bleeding in her wound bag yesterday.  The drain site seems to be feeling better since starting the Augmentin.  Her right sided PICC line continues to function for infusion but they are unable to draw labs. She likely has a fibrin sheath over the end of the PICC line.  Filed Vitals:   10/14/12 1427  BP: 90/58  Pulse: 80  Temp: 97.6 F (36.4 C)  Resp: 15   The erythema around her drain site is decreased. Minimal drain output. She has some clear fluid coming out around her fistula in the midline. I observe this area closely for several minutes and I do not see any bleeding.  We will try to arrange home health care to help her obtain her ostomy supplies. We will also ask radiology to replace her PICC line and probably move it to the other arm.  Refill pain meds  Recheck 2 weeks.  [name]. [name], MD, [facility] Surgery  General/ Trauma Surgery  10/14/2012 3:24 PM

## 2012-10-15 ENCOUNTER — Telehealth (INDEPENDENT_AMBULATORY_CARE_PROVIDER_SITE_OTHER): Payer: Self-pay | Admitting: General Surgery

## 2012-10-15 ENCOUNTER — Other Ambulatory Visit (INDEPENDENT_AMBULATORY_CARE_PROVIDER_SITE_OTHER): Payer: Self-pay | Admitting: Surgery

## 2012-10-15 ENCOUNTER — Ambulatory Visit (HOSPITAL_COMMUNITY)
Admission: RE | Admit: 2012-10-15 | Discharge: 2012-10-15 | Disposition: A | Discharge status: Home / Self Care | Payer: Medicare Other | Source: Ambulatory Visit | Attending: Surgery | Admitting: Surgery

## 2012-10-15 ENCOUNTER — Ambulatory Visit: Payer: Medicare Other | Admitting: Internal Medicine

## 2012-10-15 DIAGNOSIS — Y849 Medical procedure, unspecified as the cause of abnormal reaction of the patient, or of later complication, without mention of misadventure at the time of the procedure: Secondary | ICD-10-CM | POA: Insufficient documentation

## 2012-10-15 DIAGNOSIS — T82598A Other mechanical complication of other cardiac and vascular devices and implants, initial encounter: Secondary | ICD-10-CM | POA: Insufficient documentation

## 2012-10-15 DIAGNOSIS — K316 Fistula of stomach and duodenum: Secondary | ICD-10-CM | POA: Insufficient documentation

## 2012-10-15 DIAGNOSIS — Z5189 Encounter for other specified aftercare: Secondary | ICD-10-CM

## 2012-10-15 MED ORDER — CHLORHEXIDINE GLUCONATE 4 % EX LIQD
CUTANEOUS | Status: AC
  Filled 2012-10-15: qty 30

## 2012-10-15 MED ORDER — IOHEXOL 300 MG/ML  SOLN
50.0000 mL | Freq: Once | INTRAMUSCULAR | Status: AC | PRN
  Administered 2012-10-15: 15 mL via INTRAVENOUS

## 2012-10-15 NOTE — Telephone Encounter (Signed)
LMOM to let patient know that I'm still working on her some supplies. I spoke to [employer] over at [employer] and she stated that she needed new order for the type of insurance that the patient had that they will try to get her what is called wound bag. I sent over the order to [facility] and they will let me know. Interim phone # [phone]/ fax # [phone]

## 2012-10-16 ENCOUNTER — Encounter (INDEPENDENT_AMBULATORY_CARE_PROVIDER_SITE_OTHER): Payer: Self-pay

## 2012-10-17 ENCOUNTER — Telehealth (INDEPENDENT_AMBULATORY_CARE_PROVIDER_SITE_OTHER): Payer: Self-pay | Admitting: General Surgery

## 2012-10-17 ENCOUNTER — Encounter: Payer: Self-pay | Admitting: Internal Medicine

## 2012-10-17 ENCOUNTER — Ambulatory Visit (INDEPENDENT_AMBULATORY_CARE_PROVIDER_SITE_OTHER): Payer: Medicare Other | Admitting: Internal Medicine

## 2012-10-17 VITALS — BP 96/56 | HR 107 | Temp 98.9°F | Resp 16 | Ht 64.75 in | Wt 98.0 lb

## 2012-10-17 DIAGNOSIS — I2699 Other pulmonary embolism without acute cor pulmonale: Secondary | ICD-10-CM

## 2012-10-17 DIAGNOSIS — F329 Major depressive disorder, single episode, unspecified: Secondary | ICD-10-CM

## 2012-10-17 DIAGNOSIS — J449 Chronic obstructive pulmonary disease, unspecified: Secondary | ICD-10-CM

## 2012-10-17 DIAGNOSIS — K316 Fistula of stomach and duodenum: Secondary | ICD-10-CM

## 2012-10-17 DIAGNOSIS — M1990 Unspecified osteoarthritis, unspecified site: Secondary | ICD-10-CM

## 2012-10-17 MED ORDER — FLUCONAZOLE 40 MG/ML PO SUSR: 200.0000 mg | ORAL | Status: DC

## 2012-10-17 NOTE — Telephone Encounter (Signed)
Called [facility] and spoke to [name] this morning to see if they had found a wound bag for Cynthia Bowen and she stated yes and that Cynthia Bowen nurse Victorino Dike went to patient home yesterday and placed the wound bag on the patient and she will be ordering more supplies for the patient. I called Cynthia Bowen to make sure that Victorino Dike did come to her home and she stated that they did and they will be bring in supplies. I told Cynthia Bowen that I wanted to be sure that they did. I told her that if she needs anything to call me to let me know.

## 2012-10-17 NOTE — Progress Notes (Signed)
Subjective:    Patient ID: Cynthia Bowen, female    DOB: Jun 25, 1945, 67 y.o.   MRN: [id number]  HPIfirst f/u here in months following abd surgery with complications: She had open gastric biopsy=  Benign, 3/14. Unfortunately, the patient was extremely malnourished at the time of surgery, and has had multiple leaks from the biopsy site, with several further surgeries to try to repair the leak. 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 She is maintained on TNA via PICC line and is mostly NPO except for a few meds. At one point she was hospitalized for bilateral PE's and is currently anticoagulated on Xarelto. [facility] is managing her TNA at home, and amazingly she has reattained her Preoperative weight. She has recovered from post-op peritonitis with abdominal cavity abscesses as well. She is frequently nauseated, although she is better after her most recent drain plus augmentin. CT showed resolution of her last subhepatic abscess.  Next eval Dr [name] 10/27/12  She continues to have intermittent cough and is not active enough to express any dyspnea, but is very concerned about her diagnosis of COPD, as well as her recent pulmonary emboli. She has no chest pain. She has had evidence of emphysematous changes on some chest x-rays, but not all recent films, and will need further pulmonary evaluation to reach a firm diagnosis of COPD. She is not using an inhaler, and not actively wheezing.  Her current problem list includes Patient Active Problem List   Diagnosis Date Noted  . 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 Hemoptysis 08/06/2012  . PE (pulmonary embolism) 08/05/2012  . Abdominal pain 08/05/2012  . Incarcerated epigastric hernia s/p primary repair 07/02/2012 07/11/2012  . Protein-calorie malnutrition, severe 07/11/2012  . 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 [id number] 07/10/2012  .  Loss of weight 06/19/2012  . Anorexia 06/19/2012  . Unspecified gastritis and gastroduodenitis without mention of hemorrhage 04/18/2012  . Pericardial effusion 04/17/2012  . COPD (chronic obstructive pulmonary disease) 01/29/2012  . Community acquired pneumonia 12/29/2011  . Back pain 12/29/2011  . Depression 04/02/2011  . GERD (gastroesophageal reflux disease) 04/02/2011  . Osteoarthritis 04/02/2011  . Cervical neck pain with evidence of disc disease 04/02/2011  . Fibromyalgia 04/02/2011   Current outpatient prescriptions :albuterol (PROVENTIL HFA;VENTOLIN HFA) 108 (90 BASE) MCG/ACT inhaler, Inhale 2 puffs into the lungs every 4 (four) hours as needed for wheezing (cough, shortness of breath or wheezing.)., Disp: 1 Inhaler, Rfl: 1;   amoxicillin-clavulanate (AUGMENTIN) 875-125 MG per tablet, Take 1 tablet by mouth 2 (two) times daily., Disp: 28 tablet, Rfl: 0 [employer] 12.5 MG/5ML liquid, TAKE 5 MLS (12.5 MG TOTAL) BY MOUTH EVERY 6 (SIX) HOURS AS NEEDED FOR ITCHING., Disp: 118 mL, Rfl: 0;   diphenhydrAMINE (BENADRYL) 12.5 MG/5ML liquid, Place 12.5 mg into feeding tube 4 (four) times daily as needed for itching or allergies., Disp: , Rfl: ;   EPINEPHrine (EPIPEN) 0.3 mg/0.3 mL DEVI, Inject 0.3 mLs (0.3 mg total) into the muscle once., Disp: 1 Device, Rfl: 2 fentaNYL (DURAGESIC - DOSED MCG/HR) 50 MCG/HR, Place 1 patch (50 mcg total) onto the skin every 3 (three) days., Disp: 15 patch, Rfl: 0;   FLUoxetine (PROZAC) 20 MG/5ML solution, Take 5 mLs (20 mg total) by mouth daily., Disp: 120 mL, Rfl: 3;   ondansetron (ZOFRAN ODT) 4 MG disintegrating tablet, Take 1 tablet (4 mg total) by mouth every 8 (eight) hours as needed for  nausea., Disp: 30 tablet, Rfl: 0 oxyCODONE (ROXICODONE) 5 MG/5ML solution, Take 5-20 mLs (5-20 mg total) by mouth every 4 (four) hours as needed., Disp: 120 mL, Rfl: 0;   Rivaroxaban (XARELTO) 20 MG TABS, Take 1 tablet (20 mg total) by mouth daily., Disp: 30 tablet,  Rfl: 3;    Review of Systems Since starting Augmentin she has developed vaginal and perineal itching/she has a history of yeast vaginitis with antibiotics/there are no skin lesions/ No night sweats in the last few days Her depression symptoms have remained amazingly stable during this time although she is currently very discouraged about not yet having recovered from surgery. She once again is asking for referral to a major Medical Center for second opinion about her persistent gastrocutaneous fistula. When she last called asking for this everyone seemed in agreement that it would be a good thing when she decided the timing was appropriate. She is complaining recently of ankle edema bilaterally    Objective:   Physical Exam BP 96/56  Pulse 107  Temp(Src) 98.9 F (37.2 C) (Oral)  Resp 16  Ht 5' 4.75" (1.645 m)  Wt 98 lb (44.453 kg)  BMI 16.43 kg/m2  SpO2 95% No acute distress HEENT clear/no thyromegaly or nodules Lungs clear to auscultation Heart regular without murmur Extremities with trace edema on the dorsum of the feet but no other pitting edema/full peripheral pulses Her mood is good today/thought content appropriate      Assessment & Plan:  1) her chronic medical problems involving depression and musculoskeletal issues are stable at this point 2) recent pulmonary emboli are responding to treatment and the question remains about chronic obstructive pulmonary disease 3) from the perspective of her complications post surgery she seems at better point than any time since the original surgery-- she would like referral to [facility] Gen. surgery for another opinion about how to proceed from this point. Her peripheral edema which is resolving is secondary to her TPN. 4) her vaginal itching should respond to Diflucan as she completes antibiotics  She will followup here in 4-6 weeks for further pulmonary evaluation/recheck of chronic problems

## 2012-10-23 ENCOUNTER — Telehealth (INDEPENDENT_AMBULATORY_CARE_PROVIDER_SITE_OTHER): Payer: Self-pay | Admitting: General Surgery

## 2012-10-23 ENCOUNTER — Telehealth (INDEPENDENT_AMBULATORY_CARE_PROVIDER_SITE_OTHER): Payer: Self-pay | Admitting: *Deleted

## 2012-10-23 NOTE — Telephone Encounter (Signed)
Called patient back today to let her know that she needed to [facility] and ask for [name]. And I gave Cynthia Bowen the phone number that they told me that they had wound bags for Cynthia Bowen on 10-01-12.

## 2012-10-23 NOTE — Telephone Encounter (Signed)
Patient called in about 1015a today asking to speak with Pattricia Boss.  Patient stated that they were going to put her supplies on backorder and demanded to only speak to [place].  At this time patient's family has now called back stating this is an emergency because patient has to get supplies before going into this weekend.  He states that what she has gotten has been leaking and is demanding to know what to do.  He asked about coming here to office to pickup supplies or going to the emergency room.  This RN explained that Pattricia Boss has been given to message and will look into what needs to happen then either let them know or let me know.  Family member again becoming aggressive stating this is an emergency we need an answer now.  Again explained we are working hard to get an answer and as soon as we have one we will give them a call.  He was agreeable to this at this time.

## 2012-10-23 NOTE — Telephone Encounter (Signed)
Got [name] on the phone and I told her that I was talking to Selena Batten at [employer] and she stated that she had wound bags for her and her nurse Victorino Dike will bring her some supplies. I gave Ms. Myer the phone number to [facility] and ask for [employer]

## 2012-10-23 NOTE — Telephone Encounter (Signed)
This RN went back to try to help however Cynthia Bowen had been trying to call patient for the last 10 minutes.  Patient just answered so Cynthia Bowen is speaking with patient at this time.

## 2012-10-27 ENCOUNTER — Encounter (INDEPENDENT_AMBULATORY_CARE_PROVIDER_SITE_OTHER): Payer: Self-pay | Admitting: Surgery

## 2012-10-27 ENCOUNTER — Ambulatory Visit (INDEPENDENT_AMBULATORY_CARE_PROVIDER_SITE_OTHER): Payer: Medicare Other | Admitting: Surgery

## 2012-10-27 VITALS — BP 110/68 | HR 72 | Temp 98.6°F | Resp 15 | Ht 65.0 in | Wt 96.4 lb

## 2012-10-27 DIAGNOSIS — K316 Fistula of stomach and duodenum: Secondary | ICD-10-CM

## 2012-10-27 MED ORDER — OXYCODONE-ACETAMINOPHEN 5-325 MG PO TABS: 1.0000 | ORAL_TABLET | ORAL | Status: DC | PRN

## 2012-10-27 MED ORDER — NYSTATIN-TRIAMCINOLONE 100000-0.1 UNIT/GM-% EX OINT: TOPICAL_OINTMENT | Freq: Two times a day (BID) | CUTANEOUS | Status: DC

## 2012-10-27 NOTE — Patient Instructions (Addendum)
[facility] [address]  Ask for [employer]

## 2012-10-27 NOTE — Progress Notes (Signed)
The patient had a fibrin sheath dilated and her PICC line is not working. They are able to aspirate blood from this area.  She continues to have a lot of skin irritation from the gastric drainage through the fistula as well as her drain site. She is unable to keep an ostomy bag on her gastrocutaneous fistula. The drain seems to be putting out anything that she tries to drink.  Filed Vitals:   10/27/12 1556  BP: 110/68  Pulse: 72  Temp: 98.6 F (37 C)  Resp: 15   Her midline incision has just a small opening that is barely large enough to allow the tip of a cotton swab. The skin around this area is red and excoriated from the gastric drainage. No abscesses are noted. The right upper quadrant drain has some thin fluid with some particulate sediment. The skin around the drain insertion site is also swollen and erythematous.  Her most recent lab work from the home health TNA infusion agency shows that her prealbumin is up to 14. Electrolytes are within normal limits. Liver function test are normal.  The patient has an appointment next week at [facility] for a second opinion. I would welcome an alternative viewpoint on the management of this problem. She will get [place] the name of that surgeon to make sure that he has all of the available records. We also consult at the ostomy specialist at [facility] medical supply to see if they could come up with a better way to manage the drainage to limit the irritation of her abdominal wall. I gave her a prescription for nystatin and triamcinolone ointment to see if this will help protect her skin. We will recheck her after [facility].  Molli Hazard K. [name], MD, [facility] Surgery  General/ Trauma Surgery  10/27/2012 5:14 PM

## 2012-10-28 ENCOUNTER — Encounter (INDEPENDENT_AMBULATORY_CARE_PROVIDER_SITE_OTHER): Payer: Self-pay | Admitting: Surgery

## 2012-10-28 ENCOUNTER — Telehealth (INDEPENDENT_AMBULATORY_CARE_PROVIDER_SITE_OTHER): Payer: Self-pay | Admitting: General Surgery

## 2012-10-28 ENCOUNTER — Encounter (INDEPENDENT_AMBULATORY_CARE_PROVIDER_SITE_OTHER): Payer: Self-pay

## 2012-10-28 NOTE — Telephone Encounter (Signed)
Mrs Yono called this morning to let me know that she is see Dr Sima Matas at [facility] on July 16. [phone]. And she also stated that Dr Merla Riches gave her Diflucan 40 mg for once a week for 4 weeks

## 2012-10-31 ENCOUNTER — Emergency Department (HOSPITAL_COMMUNITY)
Admission: EM | Admit: 2012-10-31 | Discharge: 2012-11-01 | Disposition: A | Discharge status: Home / Self Care | Payer: Medicare Other | Attending: Emergency Medicine | Admitting: Emergency Medicine

## 2012-10-31 ENCOUNTER — Telehealth (INDEPENDENT_AMBULATORY_CARE_PROVIDER_SITE_OTHER): Payer: Self-pay | Admitting: General Surgery

## 2012-10-31 ENCOUNTER — Encounter (HOSPITAL_COMMUNITY): Payer: Self-pay | Admitting: *Deleted

## 2012-10-31 ENCOUNTER — Encounter (INDEPENDENT_AMBULATORY_CARE_PROVIDER_SITE_OTHER): Payer: Self-pay

## 2012-10-31 DIAGNOSIS — M129 Arthropathy, unspecified: Secondary | ICD-10-CM | POA: Insufficient documentation

## 2012-10-31 DIAGNOSIS — F329 Major depressive disorder, single episode, unspecified: Secondary | ICD-10-CM | POA: Insufficient documentation

## 2012-10-31 DIAGNOSIS — R1011 Right upper quadrant pain: Secondary | ICD-10-CM | POA: Insufficient documentation

## 2012-10-31 DIAGNOSIS — Z885 Allergy status to narcotic agent status: Secondary | ICD-10-CM | POA: Insufficient documentation

## 2012-10-31 DIAGNOSIS — Z87891 Personal history of nicotine dependence: Secondary | ICD-10-CM | POA: Insufficient documentation

## 2012-10-31 DIAGNOSIS — K219 Gastro-esophageal reflux disease without esophagitis: Secondary | ICD-10-CM | POA: Insufficient documentation

## 2012-10-31 DIAGNOSIS — IMO0001 Reserved for inherently not codable concepts without codable children: Secondary | ICD-10-CM | POA: Insufficient documentation

## 2012-10-31 DIAGNOSIS — J449 Chronic obstructive pulmonary disease, unspecified: Secondary | ICD-10-CM | POA: Insufficient documentation

## 2012-10-31 DIAGNOSIS — Z882 Allergy status to sulfonamides status: Secondary | ICD-10-CM | POA: Insufficient documentation

## 2012-10-31 DIAGNOSIS — Z888 Allergy status to other drugs, medicaments and biological substances status: Secondary | ICD-10-CM | POA: Insufficient documentation

## 2012-10-31 DIAGNOSIS — Z79899 Other long term (current) drug therapy: Secondary | ICD-10-CM | POA: Insufficient documentation

## 2012-10-31 DIAGNOSIS — M810 Age-related osteoporosis without current pathological fracture: Secondary | ICD-10-CM | POA: Insufficient documentation

## 2012-10-31 DIAGNOSIS — Z8669 Personal history of other diseases of the nervous system and sense organs: Secondary | ICD-10-CM | POA: Insufficient documentation

## 2012-10-31 DIAGNOSIS — K632 Fistula of intestine: Secondary | ICD-10-CM | POA: Insufficient documentation

## 2012-10-31 DIAGNOSIS — Z8701 Personal history of pneumonia (recurrent): Secondary | ICD-10-CM | POA: Insufficient documentation

## 2012-10-31 DIAGNOSIS — K316 Fistula of stomach and duodenum: Secondary | ICD-10-CM

## 2012-10-31 DIAGNOSIS — Z7901 Long term (current) use of anticoagulants: Secondary | ICD-10-CM | POA: Insufficient documentation

## 2012-10-31 DIAGNOSIS — F3289 Other specified depressive episodes: Secondary | ICD-10-CM | POA: Insufficient documentation

## 2012-10-31 DIAGNOSIS — G709 Myoneural disorder, unspecified: Secondary | ICD-10-CM | POA: Insufficient documentation

## 2012-10-31 DIAGNOSIS — J4489 Other specified chronic obstructive pulmonary disease: Secondary | ICD-10-CM | POA: Insufficient documentation

## 2012-10-31 LAB — CBC WITH DIFFERENTIAL/PLATELET
Basophils Absolute: 0 10*3/uL (ref 0.0–0.1)
Basophils Relative: 0 % (ref 0–1)
Eosinophils Relative: 1 % (ref 0–5)
HCT: 26 % — ABNORMAL LOW (ref 36.0–46.0)
MCHC: 34.2 g/dL (ref 30.0–36.0)
MCV: 71.8 fL — ABNORMAL LOW (ref 78.0–100.0)
Monocytes Absolute: 0.7 10*3/uL (ref 0.1–1.0)
RDW: 15.9 % — ABNORMAL HIGH (ref 11.5–15.5)

## 2012-10-31 LAB — COMPREHENSIVE METABOLIC PANEL
AST: 20 U/L (ref 0–37)
Albumin: 2.5 g/dL — ABNORMAL LOW (ref 3.5–5.2)
CO2: 26 mEq/L (ref 19–32)
Calcium: 9.3 mg/dL (ref 8.4–10.5)
Creatinine, Ser: 0.69 mg/dL (ref 0.50–1.10)
GFR calc non Af Amer: 89 mL/min — ABNORMAL LOW (ref >90)

## 2012-10-31 MED ORDER — IOHEXOL 300 MG/ML  SOLN
25.0000 mL | Freq: Once | INTRAMUSCULAR | Status: AC | PRN
  Administered 2012-10-31: 25 mL via ORAL

## 2012-10-31 MED ORDER — OXYCODONE-ACETAMINOPHEN 5-325 MG PO TABS
1.0000 | ORAL_TABLET | Freq: Once | ORAL | Status: AC
  Administered 2012-10-31: 1 via ORAL
  Filled 2012-10-31: qty 1

## 2012-10-31 MED ORDER — MORPHINE SULFATE 4 MG/ML IJ SOLN
6.0000 mg | Freq: Once | INTRAMUSCULAR | Status: AC
  Administered 2012-10-31: 6 mg via INTRAVENOUS
  Filled 2012-10-31: qty 2

## 2012-10-31 MED ORDER — SODIUM CHLORIDE 0.9 % IV SOLN
INTRAVENOUS | Status: DC
  Administered 2012-10-31: 23:00:00 via INTRAVENOUS

## 2012-10-31 NOTE — ED Provider Notes (Addendum)
History    CSN: [id number] Arrival date & time 10/31/12  1739  First MD Initiated Contact with Patient 10/31/12 2046     Chief Complaint  Patient presents with  . JP drain leaking    (Consider location/radiation/quality/duration/timing/severity/associated sxs/prior Treatment) HPI Patient reports a chronic gastrocutaneous fistula has been followed closely by general surgery.  She has a chronic midline abdominal wound that continues to leak fluid and a JP drain in the right upper quadrant and puts out approximately 80-90 cc of fluid daily.  She reports increasing abdominal pain over last 3 days with increased JP drain output as well as increased leakage from her mid abdominal wound.  She denies fevers and chills.  No nausea or vomiting.  No diarrhea.  Her pain is worsening.  Nothing improves her pain.  Past Medical History  Diagnosis Date  . COPD (chronic obstructive pulmonary disease)   . Pneumonia 12-2011  . GERD (gastroesophageal reflux disease)   . Headache(784.0)   . Arthritis     osteoarthritis  . Allergy   . Depression   . Neuromuscular disorder   . Osteoporosis   . Bronchitis     CURRENTLY AS OF 06/30/12 - HAS COUGH AND FINISHED ANTIBIOTIC FOR BRONCHITIS  . Fibromyalgia   . Pain     ABDOMINAL PAIN AND NAUSEA  . Pain     SOMETIMES PAIN RIGHT EAR AND NECK--STATES CAUSED BY A "LUMP" ON BACK OF EAR--USES KENALOG CREAM TOPICALLY AS NEEDED.   Past Surgical History  Procedure Laterality Date  . Abdominal hysterectomy    . Esophagogastroduodenoscopy  04/18/2012    Procedure: ESOPHAGOGASTRODUODENOSCOPY (EGD);  Surgeon: [name], MD;  Location: [facility];  Service: Endoscopy;  Laterality: N/A;  . Eus  05/29/2012    Procedure: UPPER ENDOSCOPIC ULTRASOUND (EUS) LINEAR;  Surgeon: [name], MD;  Location: [facility];  Service: Endoscopy;  Laterality: N/A;  . Appendectomy    . Spine surgery      CERVICAL SPINE SURGERY X 2 - INCLUDING FUSION; LUMBAR SURGERY FOR  RUPTURED DISC  . Eye surgery      BILATERAL CATARACT EXTRACTIONS  . Laparoscopic abdominal exploration N/A 07/02/2012    Procedure: converted to laparotomy with gastric biopsy;  Surgeon: [name]. [name], MD;  Location: [facility];  Service: General;  Laterality: N/A;  Laparoscopic Gastric Biospy attempted.   . Laparotomy N/A 07/07/2012    Procedure: EXPLORATORY LAPAROTOMY repair of gastric perforation with omental graham patch, drainage of abdominal abcess;  Surgeon: [name]. [name], MD;  Location: [facility];  Service: General;  Laterality: N/A;  . Stomach surgery  07/07/2012    Omental patch of gastric perforation  . Laparotomy N/A 07/13/2012    Procedure: EXPLORATORY LAPAROTOMY repair gastric leak;  Surgeon: Mariella Saa, MD;  Location: [facility];  Service: General;  Laterality: N/A;   Family History  Problem Relation Age of Onset  . COPD Mother   . Hypertension Maternal Grandmother    History  Substance Use Topics  . Smoking status: Former Smoker -- 0.50 packs/day for 35 years    Types: Cigarettes    Start date: 01/04/2003    Quit date: 04/17/2005  . Smokeless tobacco: Never Used  . Alcohol Use: No   OB History   Grav Para Term Preterm Abortions TAB SAB Ect Mult Living                 Review of Systems  All other systems reviewed and are negative.  Allergies  Avelox; Betadine; Alendronate sodium; Aspirin; Codeine; Doxycycline; Fluconazole; Hydrocodone; Neurontin; Nsaids; Sertraline hcl; and Sulfa antibiotics  Home Medications   Current Outpatient Rx  Name  Route  Sig  Dispense  Refill  . albuterol (PROVENTIL HFA;VENTOLIN HFA) 108 (90 BASE) MCG/ACT inhaler   Inhalation   Inhale 2 puffs into the lungs every 4 (four) hours as needed for wheezing or shortness of breath.         . diphenhydrAMINE (BENADRYL) 12.5 MG/5ML liquid   Oral   Take 12.5 mg by mouth 4 (four) times daily.          Marland Kitchen EPINEPHrine (EPIPEN) 0.3 mg/0.3 mL DEVI   Intramuscular   Inject 0.3 mLs  (0.3 mg total) into the muscle once.   1 Device   2   . fentaNYL (DURAGESIC - DOSED MCG/HR) 50 MCG/HR   Transdermal   Place 1 patch (50 mcg total) onto the skin every 3 (three) days.   15 patch   0   . fluconazole (DIFLUCAN) 40 MG/ML suspension   Oral   Take 200 mg by mouth every 7 (seven) days. 4 week course - takes on Saturdays         . FLUoxetine (PROZAC) 20 MG/5ML solution   Oral   Take 5 mLs (20 mg total) by mouth daily.   120 mL   3   . nystatin-triamcinolone ointment (MYCOLOG)   Topical   Apply topically 2 (two) times daily.   30 g   0   . ondansetron (ZOFRAN-ODT) 4 MG disintegrating tablet   Oral   Take 4 mg by mouth 2 (two) times daily as needed for nausea. Takes with percocet         . oxyCODONE-acetaminophen (PERCOCET/ROXICET) 5-325 MG per tablet   Oral   Take 1 tablet by mouth every 4 (four) hours as needed for pain.   40 tablet   0   . Rivaroxaban (XARELTO) 20 MG TABS   Oral   Take 20 mg by mouth daily with supper.          BP 152/87  Pulse 87  Temp(Src) 97.9 F (36.6 C) (Oral)  Resp 20  Wt 96 lb (43.545 kg)  BMI 15.98 kg/m2  SpO2 100% Physical Exam  Nursing note and vitals reviewed. Constitutional: She is oriented to person, place, and time. She appears well-developed and well-nourished. No distress.  HENT:  Head: Normocephalic and atraumatic.  Eyes: EOM are normal.  Neck: Normal range of motion.  Cardiovascular: Normal rate, regular rhythm and normal heart sounds.   Pulmonary/Chest: Effort normal and breath sounds normal.  Abdominal: Soft. She exhibits no distension. There is no tenderness.  JP drain in right upper quadrant without evidence of surrounding erythema.  There is tenderness surrounding this area.  Midline chronic-appearing abdominal wound with pinpoint opening in the midline of her abdomen.  A small amount of pus was able to be expressed from this area with palpation around the wound.  There are skin changes consistent with  chronic iirritation from gastric contents, no acute secondary signs of cellulitis  Musculoskeletal: Normal range of motion.  Neurological: She is alert and oriented to person, place, and time.  Skin: Skin is warm and dry.  Psychiatric: She has a normal mood and affect. Judgment normal.    ED Course  Procedures (including critical care time) Labs Reviewed  CBC WITH DIFFERENTIAL - Abnormal; Notable for the following:    WBC 11.7 (*)    RBC  3.62 (*)    Hemoglobin 8.9 (*)    HCT 26.0 (*)    MCV 71.8 (*)    MCH 24.6 (*)    RDW 15.9 (*)    Platelets 483 (*)    Neutro Abs 8.5 (*)    All other components within normal limits  COMPREHENSIVE METABOLIC PANEL - Abnormal; Notable for the following:    Sodium 131 (*)    Albumin 2.5 (*)    Total Bilirubin 0.2 (*)    GFR calc non Af Amer 89 (*)    All other components within normal limits   Ct Abdomen Pelvis W Contrast  11/01/2012   *RADIOLOGY REPORT*  Clinical Data: Increasing abdominal pain and increased drainage from gastrocutaneous fistula.  Chronic midline abdominal wound.  JP drain.  CT ABDOMEN AND PELVIS WITH CONTRAST  Technique:  Multidetector CT imaging of the abdomen and pelvis was performed following the standard protocol during bolus administration of intravenous contrast.  Contrast: OMNIPAQUE IOHEXOL 300 MG/ML  SOLN  Comparison: 10/13/2012  Findings: Minimal left pleural effusion or thickening. Emphysematous changes in the lung bases.  Focal atelectasis in the right lung base. Small pericardial effusion, similar to previous study.  There is mild intrahepatic bile duct dilatation similar to previous study.  Extrahepatic bile ducts are not dilated.  No focal liver lesions.  The gallbladder, spleen, pancreas, adrenal glands, inferior vena cava, and retroperitoneal lymph nodes are unremarkable.  Multiple cysts in both kidneys appear stable.  5 mm stone in the upper pole of the right kidney, 4 mm and 7 mm stones in the lower pole of the  right kidney appears stable.  No hydronephrosis in either kidney.  Calcification of the abdominal aorta without aneurysm.  Stomach, small bowel, and colon are not abnormally distended and contrast material flows to the rectum without evidence of obstruction.  No focal wall thickening.  There is a pigtail drainage catheter in the right upper quadrant inferior to the left lobe of the liver.  There is minimal fluid and gas around the catheter.  Fluid and gas and scar extend along the tract to the subcutaneous skin surface.  The appearance is stable since the previous study.  There is scarring and increased density with linear gas collection extending from the area of catheter placement along the midline to the skin surface.  This is consistent with history of fistula.  Previously, there is contrast material in the fistula that this is not noted today.  Otherwise there is no significant change.  No evidence of any developing abscess or loculation.  No free air in the abdomen.  Pelvis:  The uterus is surgically absent. Cystic structure in the right adnexa consistent with ovarian cyst and measuring 1.8 x 2.3 cm.  No abnormal adnexal masses.  Bladder wall is not thickened. Scattered diverticula in the sigmoid colon without diverticulitis. Appendix is not identified.  No free or loculated pelvic fluid collections. No significant pelvic lymphadenopathy.  Degenerative changes in the lumbar spine.  IMPRESSION: Right upper quadrant drainage catheter with minimal fluid, gas, and infiltration around the catheter.  No significant residual or recurrent abscess.  Cutaneous fistula noted from the region of the pigtail catheter to the skin surface at the midline.  Gas is present within the fistula but no contrast today.  Likely no change since previous study.  No evidence of any new or developing abscess or loculation.   Original Report Authenticated By: Burman Nieves, M.D.   I personally reviewed the imaging  tests through PACS  system I reviewed available ER/hospitalization records through the EMR  1. Gastrocutaneous fistula     MDM  CT scan pending to evaluate for intra-abdominal pathology or intra-abdominal abscess.  Lyanne Co, MD 11/01/12 0007  1:23 AM This feels better at this time.  CT scan demonstrates no acute pathology.  She'll call the Gen. surgery office on Monday for expedient followup.  Lyanne Co, MD 11/01/12 [phone]

## 2012-10-31 NOTE — ED Notes (Signed)
Pt in c/o leaking around JP drain that was placed to RLQ on 5/18

## 2012-10-31 NOTE — Telephone Encounter (Signed)
Victorino Dike from [facility] called and stated that Mrs Menees was out walking around outside and stated that her wound opened up more and the drainage is coming out more  and she can not stop the drainage. I told Victorino Dike that Gensel needs to go to the hospital.

## 2012-11-01 ENCOUNTER — Emergency Department (HOSPITAL_COMMUNITY): Payer: Medicare Other

## 2012-11-01 MED ORDER — MORPHINE SULFATE 4 MG/ML IJ SOLN
6.0000 mg | Freq: Once | INTRAMUSCULAR | Status: AC
  Administered 2012-11-01: 6 mg via INTRAVENOUS
  Filled 2012-11-01: qty 2

## 2012-11-01 MED ORDER — IOHEXOL 300 MG/ML  SOLN
100.0000 mL | Freq: Once | INTRAMUSCULAR | Status: AC | PRN
  Administered 2012-11-01: 100 mL via INTRAVENOUS

## 2012-11-01 NOTE — ED Notes (Signed)
PT declines Heparin in PICC, states will do at home.

## 2012-11-01 NOTE — ED Notes (Signed)
Pt and family comfortable with d/c and f/u instructions. 

## 2012-11-03 ENCOUNTER — Encounter (INDEPENDENT_AMBULATORY_CARE_PROVIDER_SITE_OTHER): Payer: Self-pay

## 2012-11-04 ENCOUNTER — Encounter (INDEPENDENT_AMBULATORY_CARE_PROVIDER_SITE_OTHER): Payer: Self-pay

## 2012-11-09 ENCOUNTER — Emergency Department (HOSPITAL_COMMUNITY): Payer: Medicare Other

## 2012-11-09 ENCOUNTER — Inpatient Hospital Stay (HOSPITAL_COMMUNITY)
Admission: EM | Admit: 2012-11-09 | Discharge: 2012-12-19 | DRG: 329 | Disposition: A | Discharge status: Home Health | Payer: Medicare Other | Attending: Surgery | Admitting: Surgery

## 2012-11-09 ENCOUNTER — Encounter (HOSPITAL_COMMUNITY): Payer: Self-pay | Admitting: *Deleted

## 2012-11-09 DIAGNOSIS — K55059 Acute (reversible) ischemia of intestine, part and extent unspecified: Secondary | ICD-10-CM | POA: Diagnosis present

## 2012-11-09 DIAGNOSIS — R791 Abnormal coagulation profile: Secondary | ICD-10-CM | POA: Diagnosis not present

## 2012-11-09 DIAGNOSIS — G8929 Other chronic pain: Secondary | ICD-10-CM | POA: Diagnosis present

## 2012-11-09 DIAGNOSIS — E46 Unspecified protein-calorie malnutrition: Secondary | ICD-10-CM | POA: Diagnosis present

## 2012-11-09 DIAGNOSIS — E119 Type 2 diabetes mellitus without complications: Secondary | ICD-10-CM

## 2012-11-09 DIAGNOSIS — K603 Anal fistula, unspecified: Secondary | ICD-10-CM | POA: Diagnosis present

## 2012-11-09 DIAGNOSIS — J4489 Other specified chronic obstructive pulmonary disease: Secondary | ICD-10-CM | POA: Diagnosis present

## 2012-11-09 DIAGNOSIS — R4182 Altered mental status, unspecified: Secondary | ICD-10-CM

## 2012-11-09 DIAGNOSIS — IMO0001 Reserved for inherently not codable concepts without codable children: Secondary | ICD-10-CM | POA: Diagnosis present

## 2012-11-09 DIAGNOSIS — Z86711 Personal history of pulmonary embolism: Secondary | ICD-10-CM

## 2012-11-09 DIAGNOSIS — R109 Unspecified abdominal pain: Secondary | ICD-10-CM

## 2012-11-09 DIAGNOSIS — K5669 Other intestinal obstruction: Principal | ICD-10-CM | POA: Diagnosis present

## 2012-11-09 DIAGNOSIS — E876 Hypokalemia: Secondary | ICD-10-CM | POA: Diagnosis not present

## 2012-11-09 DIAGNOSIS — J449 Chronic obstructive pulmonary disease, unspecified: Secondary | ICD-10-CM

## 2012-11-09 DIAGNOSIS — M810 Age-related osteoporosis without current pathological fracture: Secondary | ICD-10-CM | POA: Diagnosis present

## 2012-11-09 DIAGNOSIS — M1990 Unspecified osteoarthritis, unspecified site: Secondary | ICD-10-CM | POA: Diagnosis present

## 2012-11-09 DIAGNOSIS — F329 Major depressive disorder, single episode, unspecified: Secondary | ICD-10-CM | POA: Diagnosis present

## 2012-11-09 DIAGNOSIS — I10 Essential (primary) hypertension: Secondary | ICD-10-CM

## 2012-11-09 DIAGNOSIS — R64 Cachexia: Secondary | ICD-10-CM | POA: Diagnosis present

## 2012-11-09 DIAGNOSIS — K56609 Unspecified intestinal obstruction, unspecified as to partial versus complete obstruction: Secondary | ICD-10-CM

## 2012-11-09 DIAGNOSIS — K5289 Other specified noninfective gastroenteritis and colitis: Secondary | ICD-10-CM | POA: Diagnosis not present

## 2012-11-09 DIAGNOSIS — R188 Other ascites: Secondary | ICD-10-CM | POA: Diagnosis present

## 2012-11-09 DIAGNOSIS — J9600 Acute respiratory failure, unspecified whether with hypoxia or hypercapnia: Secondary | ICD-10-CM

## 2012-11-09 DIAGNOSIS — J9819 Other pulmonary collapse: Secondary | ICD-10-CM | POA: Diagnosis not present

## 2012-11-09 DIAGNOSIS — R630 Anorexia: Secondary | ICD-10-CM

## 2012-11-09 DIAGNOSIS — Z87891 Personal history of nicotine dependence: Secondary | ICD-10-CM

## 2012-11-09 DIAGNOSIS — K560 Paralytic ileus: Secondary | ICD-10-CM | POA: Diagnosis not present

## 2012-11-09 DIAGNOSIS — K316 Fistula of stomach and duodenum: Secondary | ICD-10-CM

## 2012-11-09 DIAGNOSIS — F3289 Other specified depressive episodes: Secondary | ICD-10-CM | POA: Diagnosis present

## 2012-11-09 DIAGNOSIS — N736 Female pelvic peritoneal adhesions (postinfective): Secondary | ICD-10-CM | POA: Diagnosis present

## 2012-11-09 DIAGNOSIS — D62 Acute posthemorrhagic anemia: Secondary | ICD-10-CM | POA: Diagnosis not present

## 2012-11-09 DIAGNOSIS — Z79899 Other long term (current) drug therapy: Secondary | ICD-10-CM

## 2012-11-09 DIAGNOSIS — R000 Tachycardia, unspecified: Secondary | ICD-10-CM | POA: Diagnosis not present

## 2012-11-09 DIAGNOSIS — R5381 Other malaise: Secondary | ICD-10-CM | POA: Diagnosis present

## 2012-11-09 DIAGNOSIS — K659 Peritonitis, unspecified: Secondary | ICD-10-CM | POA: Diagnosis present

## 2012-11-09 DIAGNOSIS — Z681 Body mass index (BMI) 19 or less, adult: Secondary | ICD-10-CM

## 2012-11-09 DIAGNOSIS — I319 Disease of pericardium, unspecified: Secondary | ICD-10-CM | POA: Diagnosis not present

## 2012-11-09 DIAGNOSIS — B49 Unspecified mycosis: Secondary | ICD-10-CM | POA: Diagnosis present

## 2012-11-09 DIAGNOSIS — K219 Gastro-esophageal reflux disease without esophagitis: Secondary | ICD-10-CM | POA: Diagnosis present

## 2012-11-09 DIAGNOSIS — F411 Generalized anxiety disorder: Secondary | ICD-10-CM | POA: Diagnosis not present

## 2012-11-09 HISTORY — DX: Fistula of stomach and duodenum: K31.6

## 2012-11-09 LAB — CBC WITH DIFFERENTIAL/PLATELET
Eosinophils Absolute: 0 10*3/uL (ref 0.0–0.7)
Hemoglobin: 9.7 g/dL — ABNORMAL LOW (ref 12.0–15.0)
Lymphocytes Relative: 4 % — ABNORMAL LOW (ref 12–46)
Lymphs Abs: 0.8 10*3/uL (ref 0.7–4.0)
MCH: 24 pg — ABNORMAL LOW (ref 26.0–34.0)
Monocytes Relative: 2 % — ABNORMAL LOW (ref 3–12)
Neutrophils Relative %: 94 % — ABNORMAL HIGH (ref 43–77)
RBC: 4.04 MIL/uL (ref 3.87–5.11)

## 2012-11-09 LAB — URINALYSIS, ROUTINE W REFLEX MICROSCOPIC
Bilirubin Urine: NEGATIVE
Ketones, ur: NEGATIVE mg/dL
Nitrite: NEGATIVE
Urobilinogen, UA: 0.2 mg/dL (ref 0.0–1.0)

## 2012-11-09 LAB — POCT I-STAT, CHEM 8
Chloride: 104 mEq/L (ref 96–112)
Creatinine, Ser: 0.4 mg/dL — ABNORMAL LOW (ref 0.50–1.10)
Glucose, Bld: 163 mg/dL — ABNORMAL HIGH (ref 70–99)
HCT: 32 % — ABNORMAL LOW (ref 36.0–46.0)
Hemoglobin: 10.9 g/dL — ABNORMAL LOW (ref 12.0–15.0)
Potassium: 3.7 mEq/L (ref 3.5–5.1)
Sodium: 138 mEq/L (ref 135–145)

## 2012-11-09 LAB — LIPASE, BLOOD: Lipase: 23 U/L (ref 11–59)

## 2012-11-09 LAB — COMPREHENSIVE METABOLIC PANEL
Alkaline Phosphatase: 106 U/L (ref 39–117)
BUN: 18 mg/dL (ref 6–23)
CO2: 22 mEq/L (ref 19–32)
Chloride: 98 mEq/L (ref 96–112)
GFR calc Af Amer: >90 mL/min (ref >90)
GFR calc non Af Amer: >90 mL/min (ref >90)
Glucose, Bld: 156 mg/dL — ABNORMAL HIGH (ref 70–99)
Potassium: 3.6 mEq/L (ref 3.5–5.1)
Total Bilirubin: 0.2 mg/dL — ABNORMAL LOW (ref 0.3–1.2)

## 2012-11-09 MED ORDER — HYDROMORPHONE HCL PF 1 MG/ML IJ SOLN
INTRAMUSCULAR | Status: AC
  Filled 2012-11-09: qty 2

## 2012-11-09 MED ORDER — HYDROMORPHONE HCL PF 1 MG/ML IJ SOLN
1.0000 mg | Freq: Once | INTRAMUSCULAR | Status: AC
  Administered 2012-11-09: 1 mg via INTRAVENOUS
  Filled 2012-11-09: qty 1

## 2012-11-09 MED ORDER — IOHEXOL 300 MG/ML  SOLN
25.0000 mL | INTRAMUSCULAR | Status: AC
  Administered 2012-11-09 – 2012-11-10 (×2): 25 mL via ORAL

## 2012-11-09 MED ORDER — KCL IN DEXTROSE-NACL 20-5-0.45 MEQ/L-%-% IV SOLN
INTRAVENOUS | Status: DC
  Administered 2012-11-09 – 2012-11-11 (×4): via INTRAVENOUS
  Filled 2012-11-09 (×8): qty 1000

## 2012-11-09 MED ORDER — RIVAROXABAN 20 MG PO TABS
20.0000 mg | ORAL_TABLET | Freq: Every day | ORAL | Status: DC
  Administered 2012-11-09 – 2012-11-10 (×2): 20 mg via ORAL
  Filled 2012-11-09 (×3): qty 1

## 2012-11-09 MED ORDER — PANTOPRAZOLE SODIUM 40 MG IV SOLR
40.0000 mg | Freq: Every day | INTRAVENOUS | Status: DC
  Administered 2012-11-09 – 2012-11-16 (×8): 40 mg via INTRAVENOUS
  Filled 2012-11-09 (×10): qty 40

## 2012-11-09 MED ORDER — DIPHENHYDRAMINE HCL 12.5 MG/5ML PO LIQD
12.5000 mg | Freq: Four times a day (QID) | ORAL | Status: DC
  Filled 2012-11-09 (×2): qty 5

## 2012-11-09 MED ORDER — FENTANYL 25 MCG/HR TD PT72
50.0000 ug | MEDICATED_PATCH | TRANSDERMAL | Status: DC
  Administered 2012-11-10: 50 ug via TRANSDERMAL
  Filled 2012-11-09: qty 1

## 2012-11-09 MED ORDER — SODIUM CHLORIDE 0.9 % IJ SOLN
10.0000 mL | INTRAMUSCULAR | Status: DC | PRN
  Administered 2012-11-09 – 2012-12-02 (×2): 20 mL
  Administered 2012-12-05 – 2012-12-10 (×2): 10 mL
  Administered 2012-12-18: 30 mL

## 2012-11-09 MED ORDER — ONDANSETRON HCL 4 MG/2ML IJ SOLN
4.0000 mg | Freq: Three times a day (TID) | INTRAMUSCULAR | Status: DC | PRN
  Administered 2012-11-09 – 2012-11-11 (×3): 4 mg via INTRAVENOUS
  Filled 2012-11-09 (×3): qty 2

## 2012-11-09 MED ORDER — ONDANSETRON HCL 4 MG/2ML IJ SOLN
4.0000 mg | Freq: Once | INTRAMUSCULAR | Status: AC
  Administered 2012-11-09: 4 mg via INTRAVENOUS
  Filled 2012-11-09: qty 2

## 2012-11-09 MED ORDER — ALBUTEROL SULFATE HFA 108 (90 BASE) MCG/ACT IN AERS
2.0000 | INHALATION_SPRAY | RESPIRATORY_TRACT | Status: DC | PRN
  Filled 2012-11-09: qty 6.7

## 2012-11-09 MED ORDER — FLUOXETINE HCL 20 MG/5ML PO SOLN
20.0000 mg | Freq: Every day | ORAL | Status: DC
  Administered 2012-11-10: 20 mg via ORAL
  Filled 2012-11-09 (×2): qty 5

## 2012-11-09 MED ORDER — PROMETHAZINE HCL 25 MG/ML IJ SOLN
12.5000 mg | INTRAMUSCULAR | Status: DC | PRN
Start: 2012-11-09 — End: 2012-12-18
  Administered 2012-11-09 – 2012-12-12 (×28): 12.5 mg via INTRAVENOUS
  Filled 2012-11-09 (×31): qty 1

## 2012-11-09 MED ORDER — DIPHENHYDRAMINE HCL 12.5 MG/5ML PO ELIX
12.5000 mg | ORAL_SOLUTION | Freq: Four times a day (QID) | ORAL | Status: DC
  Administered 2012-11-10: 12.5 mg via ORAL
  Administered 2012-11-10: 11:00:00 via ORAL
  Administered 2012-11-10 (×3): 12.5 mg via ORAL
  Filled 2012-11-09 (×8): qty 5

## 2012-11-09 MED ORDER — HYDROMORPHONE HCL PF 1 MG/ML IJ SOLN
1.0000 mg | INTRAMUSCULAR | Status: DC | PRN
  Administered 2012-11-09 – 2012-11-11 (×13): 2 mg via INTRAVENOUS
  Filled 2012-11-09 (×12): qty 2

## 2012-11-09 MED ORDER — HYOSCYAMINE SULFATE 0.5 MG/ML IJ SOLN
0.2500 mg | INTRAMUSCULAR | Status: DC | PRN
  Administered 2012-11-09 – 2012-11-11 (×4): 0.25 mg via INTRAVENOUS
  Filled 2012-11-09 (×6): qty 0.5

## 2012-11-09 MED ORDER — ONDANSETRON 4 MG PO TBDP
4.0000 mg | ORAL_TABLET | Freq: Two times a day (BID) | ORAL | Status: DC | PRN
  Filled 2012-11-09: qty 1

## 2012-11-09 NOTE — ED Notes (Signed)
PT MOTHER TO NURSES STATION ASKING WHEN SHE RETURNED AND WHAT WE WERE DOING NOW. SHE SPOKE WITH SECRETARY. PATIENT IS ALERT AND ORIENTED AND ABLE TO  UPDATE FAMILY ON PLAN OF CARE

## 2012-11-09 NOTE — ED Notes (Signed)
Patient has called via call bell for pain medicine . Will address with dr Hyacinth Meeker

## 2012-11-09 NOTE — ED Notes (Signed)
Patient has been eval by edp. Awaiting orders. Pt wretching but no emesis. Requesting more nausea medicine and pain medicine.

## 2012-11-09 NOTE — ED Notes (Signed)
Pt states increased diffuse abdominal pain and nausea vomiting and runny stools onset yesterday

## 2012-11-09 NOTE — ED Notes (Signed)
REPORT TO TAYLOR ON 6 NORTH

## 2012-11-09 NOTE — ED Notes (Addendum)
Pt hollaring in pain. Family at bedside. Assisted pt with bed pan. Tolerated well.

## 2012-11-09 NOTE — ED Notes (Signed)
PT FATHER AND OTHER YOUNG VISITOR TO BEDSIDE. PT MOTHER ALSO AT BEDSIDE

## 2012-11-09 NOTE — ED Notes (Signed)
Patient family to desk again . They are asking when she is going to have her tests done.

## 2012-11-09 NOTE — ED Notes (Signed)
PT PLACED ON BEDPAN TO VOID

## 2012-11-09 NOTE — ED Notes (Signed)
STILL WAITING FOR IV TEAM TO COME DRAW PT LABS

## 2012-11-09 NOTE — ED Notes (Signed)
Pt family has arrived. They have come to the desk asking for pain med for pt.

## 2012-11-09 NOTE — Progress Notes (Signed)
PARENTERAL NUTRITION CONSULT NOTE - INITIAL  Pharmacy Consult for Continuation of Chronic Home TPN Indication: GI leak, intraabdominal abscess  Allergies  Allergen Reactions  . Avelox (Moxifloxacin Hcl In Nacl) Nausea And Vomiting  . Betadine (Povidone Iodine) Itching and Rash  . Alendronate Sodium Nausea And Vomiting  . Aspirin Nausea Only  . Codeine Nausea And Vomiting  . Doxycycline     Pt doesn't remember reaction  . Fluconazole     Pt doesn't remember reaction  . Hydrocodone Nausea And Vomiting    GI distress  . Neurontin (Gabapentin) Other (See Comments)    Mood changes   . Nsaids Other (See Comments)    Severe gastritis & perforation - avoid NSAIDs when possible  . Sertraline Hcl Other (See Comments)    Hallucinations   . Sulfa Antibiotics Rash    Patient Measurements:   Adjusted Body Weight: 43.5kg  Vital Signs: Temp: 98 F (36.7 C) (07/20 1630) Temp src: Oral (07/20 1630) BP: 175/130 mmHg (07/20 1630) Pulse Rate: 104 (07/20 1630) Intake/Output from previous day:   Intake/Output from this shift: Total I/O In: 20 [I.V.:20] Out: -   Labs:  Recent Labs  11/09/12 1520 11/09/12 1545  WBC 20.3*  --   HGB 9.7* 10.9*  HCT 28.3* 32.0*  PLT 531*  --      Recent Labs  11/09/12 1520 11/09/12 1545  NA 134* 138  K 3.6 3.7  CL 98 104  CO2 22  --   GLUCOSE 156* 163*  BUN 18 17  CREATININE 0.47* 0.40*  CALCIUM 9.5  --   PROT 8.6*  --   ALBUMIN 2.9*  --   AST 15  --   ALT 9  --   ALKPHOS 106  --   BILITOT 0.2*  --    The CrCl is unknown because both a height and weight (above a minimum accepted value) are required for this calculation.   No results found for this basename: GLUCAP,  in the last 72 hours  Medical History: Past Medical History  Diagnosis Date  . COPD (chronic obstructive pulmonary disease)   . Pneumonia 12-2011  . GERD (gastroesophageal reflux disease)   . Headache(784.0)   . Arthritis     osteoarthritis  . Allergy   .  Depression   . Neuromuscular disorder   . Osteoporosis   . Bronchitis     CURRENTLY AS OF 06/30/12 - HAS COUGH AND FINISHED ANTIBIOTIC FOR BRONCHITIS  . Fibromyalgia   . Pain     ABDOMINAL PAIN AND NAUSEA  . Pain     SOMETIMES PAIN RIGHT EAR AND NECK--STATES CAUSED BY A "LUMP" ON BACK OF EAR--USES KENALOG CREAM TOPICALLY AS NEEDED.  Marland Kitchen Gastrocutaneous fistula     Medications:  Prescriptions prior to admission  Medication Sig Dispense Refill  . albuterol (PROVENTIL HFA;VENTOLIN HFA) 108 (90 BASE) MCG/ACT inhaler Inhale 2 puffs into the lungs every 4 (four) hours as needed for wheezing or shortness of breath.      . diphenhydrAMINE (BENADRYL) 12.5 MG/5ML liquid Take 12.5 mg by mouth 4 (four) times daily.       Marland Kitchen EPINEPHrine (EPIPEN) 0.3 mg/0.3 mL SOAJ Inject 0.3 mg into the muscle daily as needed (anaphylaxis).      . fentaNYL (DURAGESIC - DOSED MCG/HR) 50 MCG/HR Place 1 patch (50 mcg total) onto the skin every 3 (three) days.  15 patch  0  . fluconazole (DIFLUCAN) 40 MG/ML suspension Take 200 mg by mouth every 7 (  seven) days. 4 week course - takes on Saturdays      . FLUoxetine (PROZAC) 20 MG/5ML solution Take 5 mLs (20 mg total) by mouth daily.  120 mL  3  . nystatin-triamcinolone ointment (MYCOLOG) Apply topically 2 (two) times daily.  30 g  0  . ondansetron (ZOFRAN-ODT) 4 MG disintegrating tablet Take 4 mg by mouth 2 (two) times daily as needed for nausea. Takes with percocet      . oxyCODONE-acetaminophen (PERCOCET/ROXICET) 5-325 MG per tablet Take 1 tablet by mouth every 4 (four) hours as needed for pain.  40 tablet  0  . Rivaroxaban (XARELTO) 20 MG TABS Take 20 mg by mouth daily with supper.        Insulin Requirements in the past 24 hours:  none  Current Nutrition:  Receives home TPN via [facility], Will f/u formula in am.  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  Nutritional Goals:  1450-1600 kCal, 70-80 grams of protein per day  Plan:  Home TPN has been discontinued and will contact home health service to get current formula. Plan to start new Clinimix bag tomorrow evening. Continue with MIVF as ordered.  Verlene Mayer, PharmD, BCPS Pager [phone] 11/09/2012,6:36 PM

## 2012-11-09 NOTE — ED Provider Notes (Signed)
History    CSN: [id number] Arrival date & time 11/09/12  1211  First MD Initiated Contact with Patient 11/09/12 1211     Chief Complaint  Patient presents with  . Abdominal Pain   (Consider location/radiation/quality/duration/timing/severity/associated sxs/prior Treatment) HPI Comments: 67 year old female who has a complicated medical and surgical historyincluding a history of COPD, chronic abdominal pain related to her multiple abdominal procedures. According to the patient she had an anterior approach lumbar surgery and has developed consultations from that surgery requiring multiple subsequent surgeries. At this time she has a gastrocutaneous fistula with an ostomy type back connect for drainage. She also has a Al Pimple drain in her right upper quadrant which is a chronic drain as well. She has developed increased pain nausea and vomiting over the last 2 days, symptoms are persistent, nothing makes this better or worse, not associated with fevers or chills. She's had multiple evaluations with imaging including a CT scan performed approximately 9 days ago which showed no acute findings including no abscesses, no bowel obstructions.the paramedics gave her some nausea medication with Zofran prior to arrival with improvement in her vomiting, persistent nausea  Patient is a 67 y.o. female presenting with abdominal pain. The history is provided by the patient.  Abdominal Pain Associated symptoms include abdominal pain.   Past Medical History  Diagnosis Date  . COPD (chronic obstructive pulmonary disease)   . Pneumonia 12-2011  . GERD (gastroesophageal reflux disease)   . Headache(784.0)   . Arthritis     osteoarthritis  . Allergy   . Depression   . Neuromuscular disorder   . Osteoporosis   . Bronchitis     CURRENTLY AS OF 06/30/12 - HAS COUGH AND FINISHED ANTIBIOTIC FOR BRONCHITIS  . Fibromyalgia   . Pain     ABDOMINAL PAIN AND NAUSEA  . Pain     SOMETIMES PAIN RIGHT EAR AND  NECK--STATES CAUSED BY A "LUMP" ON BACK OF EAR--USES KENALOG CREAM TOPICALLY AS NEEDED.  Marland Kitchen Gastrocutaneous fistula    Past Surgical History  Procedure Laterality Date  . Abdominal hysterectomy    . Esophagogastroduodenoscopy  04/18/2012    Procedure: ESOPHAGOGASTRODUODENOSCOPY (EGD);  Surgeon: [name], MD;  Location: [facility];  Service: Endoscopy;  Laterality: N/A;  . Eus  05/29/2012    Procedure: UPPER ENDOSCOPIC ULTRASOUND (EUS) LINEAR;  Surgeon: [name], MD;  Location: [facility];  Service: Endoscopy;  Laterality: N/A;  . Appendectomy    . Spine surgery      CERVICAL SPINE SURGERY X 2 - INCLUDING FUSION; LUMBAR SURGERY FOR RUPTURED DISC  . Eye surgery      BILATERAL CATARACT EXTRACTIONS  . Laparoscopic abdominal exploration N/A 07/02/2012    Procedure: converted to laparotomy with gastric biopsy;  Surgeon: [name]. [name], MD;  Location: [facility];  Service: General;  Laterality: N/A;  Laparoscopic Gastric Biospy attempted.   . Laparotomy N/A 07/07/2012    Procedure: EXPLORATORY LAPAROTOMY repair of gastric perforation with omental graham patch, drainage of abdominal abcess;  Surgeon: [name]. [name], MD;  Location: [facility];  Service: General;  Laterality: N/A;  . Stomach surgery  07/07/2012    Omental patch of gastric perforation  . Laparotomy N/A 07/13/2012    Procedure: EXPLORATORY LAPAROTOMY repair gastric leak;  Surgeon: Mariella Saa, MD;  Location: [facility];  Service: General;  Laterality: N/A;   Family History  Problem Relation Age of Onset  . COPD Mother   . Hypertension Maternal Grandmother  History  Substance Use Topics  . Smoking status: Former Smoker -- 0.50 packs/day for 35 years    Types: Cigarettes    Start date: 01/04/2003    Quit date: 04/17/2005  . Smokeless tobacco: Never Used  . Alcohol Use: No   OB History   Grav Para Term Preterm Abortions TAB SAB Ect Mult Living                 Review of Systems  Gastrointestinal: Positive  for abdominal pain.  All other systems reviewed and are negative.    Allergies  Avelox; Betadine; Alendronate sodium; Aspirin; Codeine; Doxycycline; Fluconazole; Hydrocodone; Neurontin; Nsaids; Sertraline hcl; and Sulfa antibiotics  Home Medications   Current Outpatient Rx  Name  Route  Sig  Dispense  Refill  . albuterol (PROVENTIL HFA;VENTOLIN HFA) 108 (90 BASE) MCG/ACT inhaler   Inhalation   Inhale 2 puffs into the lungs every 4 (four) hours as needed for wheezing or shortness of breath.         . diphenhydrAMINE (BENADRYL) 12.5 MG/5ML liquid   Oral   Take 12.5 mg by mouth 4 (four) times daily.          Marland Kitchen EPINEPHrine (EPIPEN) 0.3 mg/0.3 mL DEVI   Intramuscular   Inject 0.3 mLs (0.3 mg total) into the muscle once.   1 Device   2   . fentaNYL (DURAGESIC - DOSED MCG/HR) 50 MCG/HR   Transdermal   Place 1 patch (50 mcg total) onto the skin every 3 (three) days.   15 patch   0   . fluconazole (DIFLUCAN) 40 MG/ML suspension   Oral   Take 200 mg by mouth every 7 (seven) days. 4 week course - takes on Saturdays         . FLUoxetine (PROZAC) 20 MG/5ML solution   Oral   Take 5 mLs (20 mg total) by mouth daily.   120 mL   3   . nystatin-triamcinolone ointment (MYCOLOG)   Topical   Apply topically 2 (two) times daily.   30 g   0   . ondansetron (ZOFRAN-ODT) 4 MG disintegrating tablet   Oral   Take 4 mg by mouth 2 (two) times daily as needed for nausea. Takes with percocet         . oxyCODONE-acetaminophen (PERCOCET/ROXICET) 5-325 MG per tablet   Oral   Take 1 tablet by mouth every 4 (four) hours as needed for pain.   40 tablet   0   . Rivaroxaban (XARELTO) 20 MG TABS   Oral   Take 20 mg by mouth daily with supper.          BP 174/98  Pulse 87  Temp(Src) 98.7 F (37.1 C) (Oral)  SpO2 100% Physical Exam  Nursing note and vitals reviewed. Constitutional: She appears well-developed and well-nourished. No distress.  HENT:  Head: Normocephalic and  atraumatic.  Mouth/Throat: Oropharynx is clear and moist. No oropharyngeal exudate.  Eyes: Conjunctivae and EOM are normal. Pupils are equal, round, and reactive to light. Right eye exhibits no discharge. Left eye exhibits no discharge. No scleral icterus.  Neck: Normal range of motion. Neck supple. No JVD present. No thyromegaly present.  Cardiovascular: Normal rate, regular rhythm, normal heart sounds and intact distal pulses.  Exam reveals no gallop and no friction rub.   No murmur heard. Pulmonary/Chest: Effort normal and breath sounds normal. No respiratory distress. She has no wheezes. She has no rales.  Abdominal: Soft. Bowel sounds are normal.  She exhibits no distension and no mass. There is tenderness ( Diffuse mild abdominal tenderness, no guarding, no peritoneal signs, quiet abdomen with occasional bowel sounds.).  No peritoneal signs, gastrocutaneous fistula bag containing stomach contents, no blood  Musculoskeletal: Normal range of motion. She exhibits no edema and no tenderness.  Lymphadenopathy:    She has no cervical adenopathy.  Neurological: She is alert. Coordination normal.  Skin: Skin is warm and dry. No rash noted. No erythema.  Psychiatric: She has a normal mood and affect. Her behavior is normal.    ED Course  Procedures (including critical care time) Labs Reviewed  URINALYSIS, ROUTINE W REFLEX MICROSCOPIC - Abnormal; Notable for the following:    Hgb urine dipstick MODERATE (*)    All other components within normal limits  URINE MICROSCOPIC-ADD ON  CBC WITH DIFFERENTIAL  COMPREHENSIVE METABOLIC PANEL  LIPASE, BLOOD   Dg Abd Acute W/chest  11/09/2012   *RADIOLOGY REPORT*  Clinical Data: Abdominal pain, nausea and diarrhea.  ACUTE ABDOMEN SERIES (ABDOMEN 2 VIEW & CHEST 1 VIEW)  Comparison: Single view of the chest 08/09/2012 and CT abdomen and pelvis 11/01/2012.  Findings: Single view of the chest demonstrates trace bilateral pleural effusions and mild basilar  atelectasis.  Right PICC remains in place.  Heart size is normal.  No pneumothorax.  Two views of the abdomen demonstrate a pigtail catheter in place with the tip just to the right of midline.  Gas filled loops of small bowel measure up to 3.3 cm and air fluid levels are present. No free intraperitoneal air is identified.  Three calcifications in the right upper quadrant consistent with nonobstructing right renal stones are seen as on prior CT.  IMPRESSION:  1.  Bowel gas pattern most compatible with small bowel obstruction. 2.  Trace bilateral pleural effusions and mild basilar atelectasis. 3.  Three nonobstructing stones right kidney as seen on prior CT. 4.  Pigtail catheter tip projects just to the right of midline of the abdomen.   Original Report Authenticated By: Holley Dexter, M.D.   1. Bowel obstruction     MDM  The patient appears uncomfortable, she has what she describes as some abdominal distention with nausea and vomiting, there is some concern for bowel obstruction however the patient is significantly for her abdominal and this may just be an element of her chronic pain. She does use a fentanyl patch  I have interpreted the xrays and it appears that she has a SBO - she has UA with no ketones at this time, blood labs pending at this time.  Multiple doses of pain medicines with some improvement but ongoing sx.  General surgery consulted due to ongoing pain, they will see pt in the ED - Dr. Janee Morn at the bedside now.  Unfortunately G tube placement would not be of any help for this unfortunate lady with this fistula.  Meds given in ED:  Medications  HYDROmorphone (DILAUDID) injection 1 mg (1 mg Intravenous Given 11/09/12 1237)  ondansetron (ZOFRAN) injection 4 mg (4 mg Intravenous Given 11/09/12 1237)  HYDROmorphone (DILAUDID) injection 1 mg (1 mg Intravenous Given 11/09/12 1351)  ondansetron (ZOFRAN) injection 4 mg (4 mg Intravenous Given 11/09/12 1351)      Vida Roller,  MD 11/09/12 [phone]

## 2012-11-09 NOTE — ED Notes (Signed)
DR Janee Morn IN TO SEE PT

## 2012-11-09 NOTE — ED Notes (Signed)
PT HAS GOTTEN UP TO BSC TO VOID. TOLERATED WELL. AWAITING SURGICAL CONSULT

## 2012-11-09 NOTE — ED Notes (Signed)
Patient family to desk to ask for pain medicine for pt.

## 2012-11-09 NOTE — ED Notes (Signed)
Pt family has come to the desk asking for a hot blanket to put on pt stomach. Blanket taken to pt

## 2012-11-09 NOTE — ED Notes (Signed)
PT REFUSES TO LET LAB DO VENIPUNCTURE TO GET HER BLOOD. WANTS BLOOD DRAWN FROM PICC LINE

## 2012-11-09 NOTE — H&P (Signed)
Cynthia Bowen is an 67 y.o. female.   Chief Complaint: Abdominal pain HPI: Patient is a long-term patient of Dr. [name] with chronic gastrocutaneous fistula and malnutrition who is on TNA at home.She underwent evaluation at [facility] for a second opinion. They were able to fashion a much better appliance which is controlling her gastric secretions. However, she developed some lower abdominal pain today and comes to the emergency department. Laboratory tests are still pending. Plain abdominal x-rays show possible small bowel obstruction. She had a bowel movement this morning and has passed some gas today.  Past Medical History  Diagnosis Date  . COPD (chronic obstructive pulmonary disease)   . Pneumonia 12-2011  . GERD (gastroesophageal reflux disease)   . Headache(784.0)   . Arthritis     osteoarthritis  . Allergy   . Depression   . Neuromuscular disorder   . Osteoporosis   . Bronchitis     CURRENTLY AS OF 06/30/12 - HAS COUGH AND FINISHED ANTIBIOTIC FOR BRONCHITIS  . Fibromyalgia   . Pain     ABDOMINAL PAIN AND NAUSEA  . Pain     SOMETIMES PAIN RIGHT EAR AND NECK--STATES CAUSED BY A "LUMP" ON BACK OF EAR--USES KENALOG CREAM TOPICALLY AS NEEDED.  Marland Kitchen Gastrocutaneous fistula     Past Surgical History  Procedure Laterality Date  . Abdominal hysterectomy    . Esophagogastroduodenoscopy  04/18/2012    Procedure: ESOPHAGOGASTRODUODENOSCOPY (EGD);  Surgeon: [name], MD;  Location: [facility];  Service: Endoscopy;  Laterality: N/A;  . Eus  05/29/2012    Procedure: UPPER ENDOSCOPIC ULTRASOUND (EUS) LINEAR;  Surgeon: [name], MD;  Location: [facility];  Service: Endoscopy;  Laterality: N/A;  . Appendectomy    . Spine surgery      CERVICAL SPINE SURGERY X 2 - INCLUDING FUSION; LUMBAR SURGERY FOR RUPTURED DISC  . Eye surgery      BILATERAL CATARACT EXTRACTIONS  . Laparoscopic abdominal exploration N/A 07/02/2012    Procedure: converted to laparotomy with gastric biopsy;   Surgeon: [name]. [name], MD;  Location: [facility];  Service: General;  Laterality: N/A;  Laparoscopic Gastric Biospy attempted.   . Laparotomy N/A 07/07/2012    Procedure: EXPLORATORY LAPAROTOMY repair of gastric perforation with omental graham patch, drainage of abdominal abcess;  Surgeon: [name]. [name], MD;  Location: [facility];  Service: General;  Laterality: N/A;  . Stomach surgery  07/07/2012    Omental patch of gastric perforation  . Laparotomy N/A 07/13/2012    Procedure: EXPLORATORY LAPAROTOMY repair gastric leak;  Surgeon: Mariella Saa, MD;  Location: [facility];  Service: General;  Laterality: N/A;    Family History  Problem Relation Age of Onset  . COPD Mother   . Hypertension Maternal Grandmother    Social History:  reports that she quit smoking about 7 years ago. Her smoking use included Cigarettes. She started smoking about 9 years ago. She has a 17.5 pack-year smoking history. She has never used smokeless tobacco. She reports that she does not drink alcohol or use illicit drugs.  Allergies:  Allergies  Allergen Reactions  . Avelox (Moxifloxacin Hcl In Nacl) Nausea And Vomiting  . Betadine (Povidone Iodine) Itching and Rash  . Alendronate Sodium Nausea And Vomiting  . Aspirin Nausea Only  . Codeine Nausea And Vomiting  . Doxycycline     Pt doesn't remember reaction  . Fluconazole     Pt doesn't remember reaction  . Hydrocodone Nausea And Vomiting  GI distress  . Neurontin (Gabapentin) Other (See Comments)    Mood changes   . Nsaids Other (See Comments)    Severe gastritis & perforation - avoid NSAIDs when possible  . Sertraline Hcl Other (See Comments)    Hallucinations   . Sulfa Antibiotics Rash     (Not in a hospital admission)  Results for orders placed during the hospital encounter of 11/09/12 (from the past 48 hour(s))  URINALYSIS, ROUTINE W REFLEX MICROSCOPIC     Status: Abnormal   Collection Time    11/09/12  1:16 PM      Result Value Range    Color, Urine YELLOW  YELLOW   APPearance CLEAR  CLEAR   Specific Gravity, Urine 1.016  1.005 - 1.030   pH 7.5  5.0 - 8.0   Glucose, UA NEGATIVE  NEGATIVE mg/dL   Hgb urine dipstick MODERATE (*) NEGATIVE   Bilirubin Urine NEGATIVE  NEGATIVE   Ketones, ur NEGATIVE  NEGATIVE mg/dL   Protein, ur NEGATIVE  NEGATIVE mg/dL   Urobilinogen, UA 0.2  0.0 - 1.0 mg/dL   Nitrite NEGATIVE  NEGATIVE   Leukocytes, UA NEGATIVE  NEGATIVE  URINE MICROSCOPIC-ADD ON     Status: None   Collection Time    11/09/12  1:16 PM      Result Value Range   Squamous Epithelial / LPF RARE  RARE   WBC, UA 0-2  <3 WBC/hpf   RBC / HPF 0-2  <3 RBC/hpf   Dg Abd Acute W/chest  11/09/2012   *RADIOLOGY REPORT*  Clinical Data: Abdominal pain, nausea and diarrhea.  ACUTE ABDOMEN SERIES (ABDOMEN 2 VIEW & CHEST 1 VIEW)  Comparison: Single view of the chest 08/09/2012 and CT abdomen and pelvis 11/01/2012.  Findings: Single view of the chest demonstrates trace bilateral pleural effusions and mild basilar atelectasis.  Right PICC remains in place.  Heart size is normal.  No pneumothorax.  Two views of the abdomen demonstrate a pigtail catheter in place with the tip just to the right of midline.  Gas filled loops of small bowel measure up to 3.3 cm and air fluid levels are present. No free intraperitoneal air is identified.  Three calcifications in the right upper quadrant consistent with nonobstructing right renal stones are seen as on prior CT.  IMPRESSION:  1.  Bowel gas pattern most compatible with small bowel obstruction. 2.  Trace bilateral pleural effusions and mild basilar atelectasis. 3.  Three nonobstructing stones right kidney as seen on prior CT. 4.  Pigtail catheter tip projects just to the right of midline of the abdomen.   Original Report Authenticated By: Holley Dexter, M.D.    Review of Systems  Constitutional: Positive for malaise/fatigue.  HENT: Negative.   Eyes: Negative.   Respiratory: Negative.   Cardiovascular:  Negative.   Gastrointestinal:       See history of present illness  Genitourinary: Negative.   Skin:       Abdominal skin doing much better with new appliance  Neurological: Negative.   Endo/Heme/Allergies: Negative.   Psychiatric/Behavioral: Negative.     Blood pressure 174/98, pulse 87, temperature 98.7 F (37.1 C), temperature source Oral, SpO2 100.00%. Physical Exam  Constitutional: She is oriented to person, place, and time. She appears well-developed.  Thin in appearance  HENT:  Head: Normocephalic and atraumatic.  Mouth/Throat: No oropharyngeal exudate.  Eyes: EOM are normal. Pupils are equal, round, and reactive to light.  Neck: Normal range of motion. No tracheal deviation present.  Cardiovascular: Normal rate, normal heart sounds and intact distal pulses.   Respiratory: Effort normal and breath sounds normal. No stridor. No respiratory distress. She has no wheezes. She has no rales. She exhibits no tenderness.  GI: Soft. She exhibits no distension. There is tenderness. There is no rebound and no guarding.  Ostomy appliance on gastrocutaneous fistula centrally.this has some creamy liquid output. Pigtail drain right upper quadrant emptying into a JP bulb.minimal fluid in JP bulb. Abdomen is tender in the lower portion mostly on the left side without any guarding and no generalized tenderness, bowel sounds are present, there is no significant distention  Musculoskeletal: Normal range of motion. She exhibits no tenderness.  Neurological: She is alert and oriented to person, place, and time. She exhibits normal muscle tone.     Assessment/Plan Chronic gastrocutaneous fistula now with abdominal pain and some small bowel dilatation on x-ray. Possible partial small bowel obstruction. Will admit, check laboratory studies including lactate and plan IV hydration and continuing TNA. Depending on her labs, further investigation with CT scan may be warranted. She had a CT scan about a week  ago. I will discuss her care with Dr. [name] as well tomorrow.  Ernesta Trabert E 11/09/2012, 3:16 PM

## 2012-11-10 ENCOUNTER — Encounter (HOSPITAL_COMMUNITY): Payer: Self-pay | Admitting: Radiology

## 2012-11-10 ENCOUNTER — Inpatient Hospital Stay (HOSPITAL_COMMUNITY): Payer: Medicare Other

## 2012-11-10 ENCOUNTER — Encounter (INDEPENDENT_AMBULATORY_CARE_PROVIDER_SITE_OTHER): Payer: Self-pay

## 2012-11-10 LAB — CBC
Hemoglobin: 9.9 g/dL — ABNORMAL LOW (ref 12.0–15.0)
MCH: 23.3 pg — ABNORMAL LOW (ref 26.0–34.0)
MCV: 71.5 fL — ABNORMAL LOW (ref 78.0–100.0)
Platelets: 489 10*3/uL — ABNORMAL HIGH (ref 150–400)
RBC: 4.25 MIL/uL (ref 3.87–5.11)
WBC: 34.3 10*3/uL — ABNORMAL HIGH (ref 4.0–10.5)

## 2012-11-10 LAB — DIFFERENTIAL
Basophils Relative: 0 % (ref 0–1)
Eosinophils Relative: 0 % (ref 0–5)
Lymphs Abs: 1 10*3/uL (ref 0.7–4.0)
Monocytes Absolute: 1.4 10*3/uL — ABNORMAL HIGH (ref 0.1–1.0)
Monocytes Relative: 4 % (ref 3–12)
Neutro Abs: 31.9 10*3/uL — ABNORMAL HIGH (ref 1.7–7.7)

## 2012-11-10 LAB — COMPREHENSIVE METABOLIC PANEL
ALT: 8 U/L (ref 0–35)
AST: 14 U/L (ref 0–37)
CO2: 21 mEq/L (ref 19–32)
Calcium: 9.5 mg/dL (ref 8.4–10.5)
Chloride: 95 mEq/L — ABNORMAL LOW (ref 96–112)
GFR calc Af Amer: >90 mL/min (ref >90)
GFR calc non Af Amer: >90 mL/min (ref >90)
Glucose, Bld: 159 mg/dL — ABNORMAL HIGH (ref 70–99)
Sodium: 129 mEq/L — ABNORMAL LOW (ref 135–145)
Total Bilirubin: 0.3 mg/dL (ref 0.3–1.2)

## 2012-11-10 LAB — LACTIC ACID, PLASMA: Lactic Acid, Venous: 2.7 mmol/L — ABNORMAL HIGH (ref 0.5–2.2)

## 2012-11-10 LAB — PHOSPHORUS: Phosphorus: 3.2 mg/dL (ref 2.3–4.6)

## 2012-11-10 MED ORDER — ZINC TRACE METAL 1 MG/ML IV SOLN
INTRAVENOUS | Status: AC
  Administered 2012-11-10: 19:00:00 via INTRAVENOUS
  Filled 2012-11-10: qty 2000

## 2012-11-10 MED ORDER — IOHEXOL 300 MG/ML  SOLN
80.0000 mL | Freq: Once | INTRAMUSCULAR | Status: AC | PRN
  Administered 2012-11-10: 80 mL via INTRAVENOUS

## 2012-11-10 MED ORDER — PIPERACILLIN-TAZOBACTAM 3.375 G IVPB
3.3750 g | Freq: Three times a day (TID) | INTRAVENOUS | Status: DC
  Administered 2012-11-10 – 2012-11-20 (×30): 3.375 g via INTRAVENOUS
  Filled 2012-11-10 (×32): qty 50

## 2012-11-10 MED ORDER — SODIUM CHLORIDE 0.9 % IJ SOLN
10.0000 mL | Freq: Two times a day (BID) | INTRAMUSCULAR | Status: DC
  Administered 2012-11-11 – 2012-11-14 (×7): 10 mL
  Administered 2012-11-14: 30 mL
  Administered 2012-11-15: 10 mL
  Administered 2012-11-15: 30 mL
  Administered 2012-11-16: 10 mL
  Administered 2012-11-17 (×2): 20 mL
  Administered 2012-11-18 – 2012-11-20 (×4): 10 mL
  Administered 2012-11-20: 30 mL
  Administered 2012-11-21: 20 mL
  Administered 2012-11-21: 10 mL
  Administered 2012-11-22: 20 mL
  Administered 2012-11-22: 10 mL
  Administered 2012-11-23: 20 mL
  Administered 2012-11-23: 10 mL
  Administered 2012-11-24: 20 mL
  Administered 2012-12-01 (×2): 10 mL
  Administered 2012-12-02: 20 mL
  Administered 2012-12-04 – 2012-12-16 (×9): 10 mL
  Filled 2012-11-10 (×2): qty 20
  Filled 2012-11-10: qty 10
  Filled 2012-11-10 (×2): qty 20

## 2012-11-10 MED ORDER — SODIUM CHLORIDE 0.9 % IJ SOLN
10.0000 mL | INTRAMUSCULAR | Status: DC | PRN
  Administered 2012-11-11 – 2012-11-25 (×4): 10 mL
  Administered 2012-11-26: 20 mL
  Administered 2012-11-26: 10 mL
  Administered 2012-11-27 – 2012-11-29 (×3): 20 mL
  Administered 2012-11-29 – 2012-11-30 (×4): 10 mL
  Administered 2012-12-06: 20 mL
  Administered 2012-12-07: 30 mL
  Administered 2012-12-08: 10 mL
  Administered 2012-12-09 – 2012-12-10 (×2): 20 mL
  Administered 2012-12-10 – 2012-12-17 (×13): 10 mL
  Administered 2012-12-17: 20 mL
  Administered 2012-12-17: 10 mL
  Administered 2012-12-18: 20 mL
  Filled 2012-11-10 (×2): qty 20
  Filled 2012-11-10: qty 10

## 2012-11-10 MED ORDER — FAT EMULSION 20 % IV EMUL
250.0000 mL | INTRAVENOUS | Status: AC
  Administered 2012-11-10: 250 mL via INTRAVENOUS
  Filled 2012-11-10: qty 250

## 2012-11-10 NOTE — Progress Notes (Signed)
PARENTERAL NUTRITION CONSULT NOTE - INITIAL  Pharmacy Consult:  TPN Indication:  Gastrocutaneous fistula  Allergies  Allergen Reactions  . Avelox (Moxifloxacin Hcl In Nacl) Nausea And Vomiting  . Betadine (Povidone Iodine) Itching and Rash  . Alendronate Sodium Nausea And Vomiting  . Aspirin Nausea Only  . Codeine Nausea And Vomiting  . Doxycycline     Pt doesn't remember reaction  . Fluconazole     Pt doesn't remember reaction  . Hydrocodone Nausea And Vomiting    GI distress  . Neurontin (Gabapentin) Other (See Comments)    Mood changes   . Nsaids Other (See Comments)    Severe gastritis & perforation - avoid NSAIDs when possible  . Sertraline Hcl Other (See Comments)    Hallucinations   . Sulfa Antibiotics Rash    Patient Measurements: Height: 5' 4.96" (165 cm) Weight: 95 lb 14.4 oz (43.5 kg) IBW/kg (Calculated) : 56.91  Vital Signs: Temp: 98.9 F (37.2 C) (07/21 0556) Temp src: Oral (07/21 0556) BP: 137/87 mmHg (07/21 0556) Pulse Rate: 130 (07/21 0556) Intake/Output from previous day: 07/20 0701 - 07/21 0700 In: 2182.5 [P.O.:500; I.V.:1682.5] Out: 770 [Urine:500; Drains:270] Intake/Output from this shift: Total I/O In: -  Out: 310 [Urine:200; Drains:110]  Labs:  Recent Labs  11/09/12 1520 11/09/12 1545 11/10/12 0635  WBC 20.3*  --  34.3*  HGB 9.7* 10.9* 9.9*  HCT 28.3* 32.0* 30.4*  PLT 531*  --  489*     Recent Labs  11/09/12 1520 11/09/12 1545 11/10/12 0635  NA 134* 138 129*  K 3.6 3.7 4.9  CL 98 104 95*  CO2 22  --  21  GLUCOSE 156* 163* 159*  BUN 18 17 16   CREATININE 0.47* 0.40* 0.60  CALCIUM 9.5  --  9.5  MG  --   --  1.8  PHOS  --   --  3.2  PROT 8.6*  --  8.4*  ALBUMIN 2.9*  --  2.6*  AST 15  --  14  ALT 9  --  8  ALKPHOS 106  --  100  BILITOT 0.2*  --  0.3  TRIG  --   --  88   The CrCl is unknown because both a height and weight (above a minimum accepted value) are required for this calculation.   No results found for  this basename: GLUCAP,  in the last 72 hours  Medical History: Past Medical History  Diagnosis Date  . COPD (chronic obstructive pulmonary disease)   . Pneumonia 12-2011  . GERD (gastroesophageal reflux disease)   . Headache(784.0)   . Arthritis     osteoarthritis  . Allergy   . Depression   . Neuromuscular disorder   . Osteoporosis   . Bronchitis     CURRENTLY AS OF 06/30/12 - HAS COUGH AND FINISHED ANTIBIOTIC FOR BRONCHITIS  . Fibromyalgia   . Pain     ABDOMINAL PAIN AND NAUSEA  . Pain     SOMETIMES PAIN RIGHT EAR AND NECK--STATES CAUSED BY A "LUMP" ON BACK OF EAR--USES KENALOG CREAM TOPICALLY AS NEEDED.  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  Pharmacy consulted to manage TPN.  GI: hx GERD.  Chronic TPN for gastrocutaneous fistula - LFTs WNL, on PPI IV Endo:  no hx - CBGs 156-163 Lytes: low Na / CL, corrected calcium mildly elevated at 10.62 - TPN may worsen hyponatremia Renal: SCr 0.6, CrCL 48 ml/min, D51/2 with KCL at 125 ml/hr Pulm: hx COPD - stable on RA Cards: no hx - BP improving, worsening tachycardia, TG 88 Hepatobil: LFTs WNL Neuro: hx depression / neuromuscular disorder / fibromyalgia - Benadryl, fentanyl patch, Prozac Cards: Xarelto for hx PE, dose appropriate, hgb low but stable, plts trending down, no bleeding ID: Zosyn D#1 (7/21 >> ), empiric for leukocytosis + possible gastroenteritis, afebrile, WBC elevated at 34.3, LA 3 >> 2.7, UA unimpressive Best Practices: Xarelto, home meds addressed TPN Access:  PICC line TPN day#: continued from home    Current Nutrition:  Home cyclic TPN: 78 gm protein/d, 312 gm dextrose/d, 50 gm lipids MWF.  Total kCal/d based on weekly average = 1659.  MVI MWF, Addamel 2.46mL daily, no insulin 12 hr  cycle   Nutritional Goals:  1650 - 1700 kCal, 75-85 grams of protein per day   Plan:  - Cycle Clinimix E 5/20 over 12 hours: 50 ml/hr x 1 hr, 158 ml/hr x 10 hrs, 50 ml/hr x 1 hr.  Clinimix and lipids MWF will provide a weekly average of 1684 kCal/d and 84gm of protein per day. - Lipids 20 ml/hr x 12 hrs on MWF - Daily multivitamin and trace elements - Remove KCL from IVF and monitor - Add Zinc and Vit C daily - F/U AM labs     Ludie Pavlik D. Laney Potash, PharmD, BCPS Pager:  [phone] 11/10/2012, 11:00 AM

## 2012-11-10 NOTE — Consult Note (Addendum)
WOC consult Note Reason for Consult: To place pouch over gastrocutaneous fistula; CCS following for assessment and plan of care.   Healed incision to mid abdomen with small opening draining light brown liquid drainage approx 30mL in current bag.  Surrounding skin is red and irritated under current pouch.  Cleaned skin and dried thoroughly and placed small Eakin pouch over area with a good seal.  Patient recently had Eakin pouch placed at [facility] prior to this admission and is familiar with how to place them and drain them. She states she will be followed by home health agency who has ordered some pouches for home use. Discussed technique for applying and when to change pouch.  Ordered supplies for bedside staff to use PRN leaking. Norva Karvonen RN, MSN Student Please re-consult if further assistance is needed.  Thank-you,  Cammie Mcgee MSN, RN, CWOCN, [place], CNS [phone]

## 2012-11-10 NOTE — Progress Notes (Signed)
Relayed CT results to Dr. Janee Morn. No new orders at this time. Pt. Is resting. Still with mild abdominal pain noted.  Will give PRN pain meds as needed. Will continue to monitor. KYoung RN

## 2012-11-10 NOTE — Progress Notes (Signed)
Subjective: Patient still with generalized/ lower abdominal pain Labs pending from today   Objective: Vital signs in last 24 hours: Temp:  [98 F (36.7 C)-98.9 F (37.2 C)] 98.9 F (37.2 C) (07/21 0556) Pulse Rate:  [44-130] 130 (07/21 0556) Resp:  [20-33] 20 (07/21 0556) BP: (135-179)/(69-130) 137/87 mmHg (07/21 0556) SpO2:  [95 %-100 %] 100 % (07/21 0556) Last BM Date: 11/09/12  Intake/Output from previous day: 07/20 0701 - 07/21 0700 In: 20 [I.V.:20] Out: 250 [Urine:100; Drains:150] Intake/Output this shift:    General appearance: alert, cooperative and uncomfortable Resp: clear to auscultation bilaterally Cardio: regular rate and rhythm, S1, S2 normal, no murmur, click, rub or gallop Drain/ ostomy - cloudy output  Lab Results:   Recent Labs  11/09/12 1520 11/09/12 1545  WBC 20.3*  --   HGB 9.7* 10.9*  HCT 28.3* 32.0*  PLT 531*  --    BMET  Recent Labs  11/09/12 1520 11/09/12 1545  NA 134* 138  K 3.6 3.7  CL 98 104  CO2 22  --   GLUCOSE 156* 163*  BUN 18 17  CREATININE 0.47* 0.40*  CALCIUM 9.5  --    PT/INR No results found for this basename: LABPROT, INR,  in the last 72 hours ABG No results found for this basename: PHART, PCO2, PO2, HCO3,  in the last 72 hours  Studies/Results: Ct Abdomen Pelvis W Contrast  11/10/2012   *RADIOLOGY REPORT*  Clinical Data: Abdominal pain all over.  Drain in place. Known gastrocutaneous fistula.  CT ABDOMEN AND PELVIS WITH CONTRAST  Technique:  Multidetector CT imaging of the abdomen and pelvis was performed following the standard protocol during bolus administration of intravenous contrast.  Contrast: 80mL OMNIPAQUE IOHEXOL 300 MG/ML  SOLN  Comparison: 11/01/2012  Findings: Small left pleural effusion with basilar atelectasis. This is stable.  Pericardial effusion is stable.  Right upper quadrant pigtail drainage catheter. Small amount of residual fluid around the catheter and extending underneath the liver along  the catheter tract.  Gas along the catheter tract.  No significant change.  There is inflammatory thickening of the stomach wall with infiltration extending from the stomach to the skin surface.  Contrast material within the stripe consistent with gastrocutaneous fistula.  This also appears stable.  There is free fluid in the upper abdomen around the liver edge and around the spleen and extending down to the pelvis.  Density measurements are indeterminate.  Infected fluid or hemorrhagic fluid is not excluded.  No free intra-abdominal air.  Pelvic small bowel loops demonstrate mild dilatation with wall thickening.  This is new since the previous study.  Infiltration in the mesentery around these loops.  Findings could represent gastroenteritis or small bowel ischemia.  The liver, spleen, gallbladder, pancreas, adrenal glands, inferior vena cava, and retroperitoneal lymph nodes are unremarkable. Calcification of the abdominal aorta without aneurysm.  Multiple cysts and stones in the kidneys without change since previous study.  No hydronephrosis in either kidney.  The stomach is not abnormally distended.  The colon is decompressed.  Pelvis:  No bladder wall thickening.  Uterus appears to be surgically absent.  No abnormal adnexal masses.  No evidence of diverticulitis.  Appendix is not identified.  Degenerative changes in the spine.  No destructive bone lesions appreciated.  IMPRESSION: Stable appearance of right upper quadrant drainage catheter. Stable fistula from stomach to skin surface.  Contrast material is demonstrated within the fistula.  There is developing free fluid in the abdomen and pelvis with  developing mild pelvic small bowel distension and wall thickening.  Changes may represent developing enteritis versus developing bowel ischemia.  Small bowel ischemia should be excluded.  Pericardial effusion and small left pleural effusion as previously.  Results were discussed with the patient's nurse on six N  surgical, Clydie Braun, at 0246 hours on 11/10/2012.   Original Report Authenticated By: Burman Nieves, M.D.   Dg Abd Acute W/chest  11/09/2012   *RADIOLOGY REPORT*  Clinical Data: Abdominal pain, nausea and diarrhea.  ACUTE ABDOMEN SERIES (ABDOMEN 2 VIEW & CHEST 1 VIEW)  Comparison: Single view of the chest 08/09/2012 and CT abdomen and pelvis 11/01/2012.  Findings: Single view of the chest demonstrates trace bilateral pleural effusions and mild basilar atelectasis.  Right PICC remains in place.  Heart size is normal.  No pneumothorax.  Two views of the abdomen demonstrate a pigtail catheter in place with the tip just to the right of midline.  Gas filled loops of small bowel measure up to 3.3 cm and air fluid levels are present. No free intraperitoneal air is identified.  Three calcifications in the right upper quadrant consistent with nonobstructing right renal stones are seen as on prior CT.  IMPRESSION:  1.  Bowel gas pattern most compatible with small bowel obstruction. 2.  Trace bilateral pleural effusions and mild basilar atelectasis. 3.  Three nonobstructing stones right kidney as seen on prior CT. 4.  Pigtail catheter tip projects just to the right of midline of the abdomen.   Original Report Authenticated By: Holley Dexter, M.D.    Anti-infectives: Anti-infectives   None      Assessment/Plan: s/p * No surgery found * gastroenteritis vs mild SB ischemia - volume depletion  IV hydration IV antibiotics NPO x ice chips Recheck labs Ostomy nurse consult   LOS: 1 day    Ronne Savoia K. 11/10/2012

## 2012-11-10 NOTE — Progress Notes (Signed)
INITIAL NUTRITION ASSESSMENT  DOCUMENTATION CODES Per approved criteria  -Severe malnutrition in the context of chronic illness -Underweight   INTERVENTION:  TPN per pharmacy RD to follow for nutrition care plan  NUTRITION DIAGNOSIS: Altered GI function related chronic gastrocutaneous fistula to as evidenced by home TPN support  Goal: TPN to meet > 90% of estimated nutrition needs  Monitor:  TPN prescription, weight, labs, I/O's  Reason for Assessment: Malnutrition Screening Tool Report, TPN  67 y.o. female  Admitting Dx: abdominal pain  ASSESSMENT: Patient with hx of chronic gastrocutaneous fistula and malnutrition; on TPN at home; presented to ED with lower abdominal pain ---> abdominal X-ray concerning for partial small bowel obstruction.   Patient is receiving cyclic TPN with Clinimix E 5/20 x 12 hours: 50 ml/hr x 1 hr, 158 ml/hr x 10 hrs, 50 ml/hr x 1 hr.  Lipids (20% IVFE @ 20 ml/hr x 12 hours) to be provided 3 times weekly (MWF).  Provides 1684 kcal and 84 grams protein daily (based on weekly average).  Meets 100% minimum estimated kcal and 100% minimum estimated protein needs.  Patient meets criteria for severe malnutrition in the context of chronic illness given 22% weight loss in 4 months and severe muscle loss (temples, clavicles, shoulders) and severe subcutaneous fat loss (biceps/triceps).  Height: Ht Readings from Last 1 Encounters:  11/10/12 5' 4.96" (1.65 m)    Weight: Wt Readings from Last 1 Encounters:  11/10/12 95 lb 14.4 oz (43.5 kg)    Ideal Body Weight: 120 lb  % Ideal Body Weight: 79%  Wt Readings from Last 10 Encounters:  11/10/12 95 lb 14.4 oz (43.5 kg)  10/31/12 96 lb (43.545 kg)  10/27/12 96 lb 6.4 oz (43.727 kg)  10/17/12 98 lb (44.453 kg)  10/14/12 97 lb (43.999 kg)  10/10/12 97 lb 3.2 oz (44.09 kg)  10/09/12 96 lb 9.6 oz (43.817 kg)  10/01/12 98 lb 9.6 oz (44.725 kg)  09/06/12 92 lb 12.8 oz (42.094 kg)  09/06/12 92 lb 12.8 oz  (42.094 kg)    Usual Body Weight: 97 lb  % Usual Body Weight: 98%  BMI:  Body mass index is 15.98 kg/(m^2).  Estimated Nutritional Needs: Kcal: 1500-1700 Protein: 75-85 gm Fluid: >/= 1.5 L  Skin: Eakin fistula pouch  Diet Order: NPO  EDUCATION NEEDS: -No education needs identified at this time   Intake/Output Summary (Last 24 hours) at 11/10/12 1156 Last data filed at 11/10/12 1100  Gross per 24 hour  Intake 2182.5 ml  Output   1170 ml  Net 1012.5 ml     Labs:   Recent Labs Lab 11/09/12 1520 11/09/12 1545 11/10/12 0635  NA 134* 138 129*  K 3.6 3.7 4.9  CL 98 104 95*  CO2 22  --  21  BUN 18 17 16   CREATININE 0.47* 0.40* 0.60  CALCIUM 9.5  --  9.5  MG  --   --  1.8  PHOS  --   --  3.2  GLUCOSE 156* 163* 159*    Scheduled Meds: . diphenhydrAMINE  12.5 mg Oral QID  . fentaNYL  50 mcg Transdermal Q72H  . FLUoxetine  20 mg Oral Daily  . pantoprazole (PROTONIX) IV  40 mg Intravenous QHS  . piperacillin-tazobactam (ZOSYN)  IV  3.375 g Intravenous Q8H  . Rivaroxaban  20 mg Oral Q supper    Continuous Infusions: . dextrose 5 % and 0.45 % NaCl with KCl 20 mEq/L 125 mL/hr at 11/09/12 1742  . Marland Kitchen  TPN (CLINIMIX-E) Adult     And  . fat emulsion      Past Medical History  Diagnosis Date  . COPD (chronic obstructive pulmonary disease)   . Pneumonia 12-2011  . GERD (gastroesophageal reflux disease)   . Headache(784.0)   . Arthritis     osteoarthritis  . Allergy   . Depression   . Neuromuscular disorder   . Osteoporosis   . Bronchitis     CURRENTLY AS OF 06/30/12 - HAS COUGH AND FINISHED ANTIBIOTIC FOR BRONCHITIS  . Fibromyalgia   . Pain     ABDOMINAL PAIN AND NAUSEA  . Pain     SOMETIMES PAIN RIGHT EAR AND NECK--STATES CAUSED BY A "LUMP" ON BACK OF EAR--USES KENALOG CREAM TOPICALLY AS NEEDED.  Marland Kitchen Gastrocutaneous fistula     Past Surgical History  Procedure Laterality Date  . Abdominal hysterectomy    . Esophagogastroduodenoscopy  04/18/2012     Procedure: ESOPHAGOGASTRODUODENOSCOPY (EGD);  Surgeon: [name], MD;  Location: [facility];  Service: Endoscopy;  Laterality: N/A;  . Eus  05/29/2012    Procedure: UPPER ENDOSCOPIC ULTRASOUND (EUS) LINEAR;  Surgeon: [name], MD;  Location: [facility];  Service: Endoscopy;  Laterality: N/A;  . Appendectomy    . Spine surgery      CERVICAL SPINE SURGERY X 2 - INCLUDING FUSION; LUMBAR SURGERY FOR RUPTURED DISC  . Eye surgery      BILATERAL CATARACT EXTRACTIONS  . Laparoscopic abdominal exploration N/A 07/02/2012    Procedure: converted to laparotomy with gastric biopsy;  Surgeon: [name]. [name], MD;  Location: [facility];  Service: General;  Laterality: N/A;  Laparoscopic Gastric Biospy attempted.   . Laparotomy N/A 07/07/2012    Procedure: EXPLORATORY LAPAROTOMY repair of gastric perforation with omental graham patch, drainage of abdominal abcess;  Surgeon: [name]. [name], MD;  Location: [facility];  Service: General;  Laterality: N/A;  . Stomach surgery  07/07/2012    Omental patch of gastric perforation  . Laparotomy N/A 07/13/2012    Procedure: EXPLORATORY LAPAROTOMY repair gastric leak;  Surgeon: Mariella Saa, MD;  Location: [facility];  Service: General;  Laterality: N/A;    Maureen Chatters, RD, LDN Pager #: [phone] After-Hours Pager #: [phone]

## 2012-11-11 ENCOUNTER — Encounter (HOSPITAL_COMMUNITY): Payer: Self-pay | Admitting: Certified Registered"

## 2012-11-11 ENCOUNTER — Encounter (HOSPITAL_COMMUNITY): Payer: Self-pay | Admitting: Anesthesiology

## 2012-11-11 ENCOUNTER — Inpatient Hospital Stay (HOSPITAL_COMMUNITY): Payer: Medicare Other

## 2012-11-11 ENCOUNTER — Encounter (HOSPITAL_COMMUNITY)
Admission: EM | Disposition: A | Discharge status: Home / Self Care | Payer: Self-pay | Source: Home / Self Care | Attending: Surgery

## 2012-11-11 ENCOUNTER — Inpatient Hospital Stay (HOSPITAL_COMMUNITY): Payer: Medicare Other | Admitting: Certified Registered"

## 2012-11-11 DIAGNOSIS — K56609 Unspecified intestinal obstruction, unspecified as to partial versus complete obstruction: Secondary | ICD-10-CM

## 2012-11-11 DIAGNOSIS — I10 Essential (primary) hypertension: Secondary | ICD-10-CM

## 2012-11-11 DIAGNOSIS — S31109A Unspecified open wound of abdominal wall, unspecified quadrant without penetration into peritoneal cavity, initial encounter: Secondary | ICD-10-CM

## 2012-11-11 DIAGNOSIS — J449 Chronic obstructive pulmonary disease, unspecified: Secondary | ICD-10-CM

## 2012-11-11 DIAGNOSIS — E119 Type 2 diabetes mellitus without complications: Secondary | ICD-10-CM

## 2012-11-11 DIAGNOSIS — R4182 Altered mental status, unspecified: Secondary | ICD-10-CM

## 2012-11-11 DIAGNOSIS — K55059 Acute (reversible) ischemia of intestine, part and extent unspecified: Secondary | ICD-10-CM

## 2012-11-11 DIAGNOSIS — J9600 Acute respiratory failure, unspecified whether with hypoxia or hypercapnia: Secondary | ICD-10-CM

## 2012-11-11 HISTORY — PX: LAPAROTOMY: SHX154

## 2012-11-11 HISTORY — PX: MINOR APPLICATION OF WOUND VAC: SHX6243

## 2012-11-11 HISTORY — PX: BOWEL RESECTION: SHX1257

## 2012-11-11 HISTORY — PX: JEJUNOSTOMY: SHX313

## 2012-11-11 HISTORY — PX: LYSIS OF ADHESION: SHX5961

## 2012-11-11 LAB — CBC
HCT: 37 % (ref 36.0–46.0)
Hemoglobin: 13.1 g/dL (ref 12.0–15.0)
MCH: 26.1 pg (ref 26.0–34.0)
MCHC: 35.4 g/dL (ref 30.0–36.0)
MCV: 72.4 fL — ABNORMAL LOW (ref 78.0–100.0)
MCV: 73.9 fL — ABNORMAL LOW (ref 78.0–100.0)
Platelets: 417 10*3/uL — ABNORMAL HIGH (ref 150–400)
RBC: 5.01 MIL/uL (ref 3.87–5.11)
RDW: 16.4 % — ABNORMAL HIGH (ref 11.5–15.5)
WBC: 39.3 10*3/uL — ABNORMAL HIGH (ref 4.0–10.5)

## 2012-11-11 LAB — BLOOD GAS, ARTERIAL
Bicarbonate: 21.4 mEq/L (ref 20.0–24.0)
PEEP: 5 cmH2O
TCO2: 22.5 mmol/L (ref 0–100)
pCO2 arterial: 36.2 mmHg (ref 35.0–45.0)
pH, Arterial: 7.389 (ref 7.350–7.450)
pO2, Arterial: 121 mmHg — ABNORMAL HIGH (ref 80.0–100.0)

## 2012-11-11 LAB — COMPREHENSIVE METABOLIC PANEL
AST: 12 U/L (ref 0–37)
Albumin: 2 g/dL — ABNORMAL LOW (ref 3.5–5.2)
BUN: 17 mg/dL (ref 6–23)
CO2: 21 mEq/L (ref 19–32)
Calcium: 8.9 mg/dL (ref 8.4–10.5)
Calcium: 9.3 mg/dL (ref 8.4–10.5)
Chloride: 99 mEq/L (ref 96–112)
Creatinine, Ser: 0.56 mg/dL (ref 0.50–1.10)
Creatinine, Ser: 0.62 mg/dL (ref 0.50–1.10)
GFR calc Af Amer: >90 mL/min (ref >90)
GFR calc non Af Amer: >90 mL/min (ref >90)
Glucose, Bld: 102 mg/dL — ABNORMAL HIGH (ref 70–99)
Total Bilirubin: 0.2 mg/dL — ABNORMAL LOW (ref 0.3–1.2)
Total Protein: 7.4 g/dL (ref 6.0–8.3)

## 2012-11-11 LAB — ABO/RH: ABO/RH(D): B POS

## 2012-11-11 LAB — SURGICAL PCR SCREEN: MRSA, PCR: NEGATIVE

## 2012-11-11 LAB — GLUCOSE, CAPILLARY
Glucose-Capillary: 107 mg/dL — ABNORMAL HIGH (ref 70–99)
Glucose-Capillary: 166 mg/dL — ABNORMAL HIGH (ref 70–99)
Glucose-Capillary: 223 mg/dL — ABNORMAL HIGH (ref 70–99)

## 2012-11-11 LAB — PREPARE RBC (CROSSMATCH)

## 2012-11-11 SURGERY — LAPAROTOMY, EXPLORATORY
Anesthesia: General | Site: Abdomen | Wound class: Dirty or Infected

## 2012-11-11 MED ORDER — CLINIMIX E/DEXTROSE (5/20) 5 % IV SOLN
INTRAVENOUS | Status: AC
  Administered 2012-11-11: 18:00:00 via INTRAVENOUS
  Filled 2012-11-11: qty 2000

## 2012-11-11 MED ORDER — MIDAZOLAM HCL 5 MG/5ML IJ SOLN
INTRAMUSCULAR | Status: DC | PRN
  Administered 2012-11-11 (×2): 1 mg via INTRAVENOUS
  Administered 2012-11-11: 2 mg via INTRAVENOUS

## 2012-11-11 MED ORDER — LACTATED RINGERS IV SOLN
INTRAVENOUS | Status: DC | PRN
  Administered 2012-11-11: 13:00:00 via INTRAVENOUS

## 2012-11-11 MED ORDER — SODIUM CHLORIDE 0.9 % IV SOLN
INTRAVENOUS | Status: DC
  Administered 2012-11-11: 17:00:00 via INTRAVENOUS

## 2012-11-11 MED ORDER — METOPROLOL TARTRATE 1 MG/ML IV SOLN
2.5000 mg | INTRAVENOUS | Status: DC | PRN
  Administered 2012-11-11 – 2012-11-14 (×5): 2.5 mg via INTRAVENOUS
  Administered 2012-11-14: 5 mg via INTRAVENOUS
  Filled 2012-11-11 (×4): qty 5

## 2012-11-11 MED ORDER — PROPOFOL 10 MG/ML IV EMUL
5.0000 ug/kg/min | INTRAVENOUS | Status: DC
  Administered 2012-11-11 (×3): 50 ug/kg/min via INTRAVENOUS
  Administered 2012-11-12: 40 ug/kg/min via INTRAVENOUS
  Filled 2012-11-11 (×3): qty 100

## 2012-11-11 MED ORDER — ENOXAPARIN SODIUM 40 MG/0.4ML ~~LOC~~ SOLN
40.0000 mg | SUBCUTANEOUS | Status: DC
  Administered 2012-11-12: 40 mg via SUBCUTANEOUS
  Filled 2012-11-11 (×2): qty 0.4

## 2012-11-11 MED ORDER — EPINEPHRINE 0.3 MG/0.3ML IJ SOAJ: 0.3000 mg | Freq: Every day | INTRAMUSCULAR | Status: DC | PRN

## 2012-11-11 MED ORDER — LACTATED RINGERS IV SOLN
INTRAVENOUS | Status: DC
  Administered 2012-11-11: 12:00:00 via INTRAVENOUS

## 2012-11-11 MED ORDER — FENTANYL CITRATE 0.05 MG/ML IJ SOLN
25.0000 ug/h | INTRAMUSCULAR | Status: DC
  Administered 2012-11-11: 50 ug/h via INTRAVENOUS
  Filled 2012-11-11: qty 50

## 2012-11-11 MED ORDER — SODIUM CHLORIDE 0.9 % IV SOLN
25.0000 ug/h | INTRAVENOUS | Status: DC
  Filled 2012-11-11 (×2): qty 50

## 2012-11-11 MED ORDER — FENTANYL BOLUS VIA INFUSION
25.0000 ug | Freq: Four times a day (QID) | INTRAVENOUS | Status: DC | PRN
  Filled 2012-11-11: qty 100

## 2012-11-11 MED ORDER — SODIUM CHLORIDE 0.9 % IV SOLN
1.0000 mg/h | INTRAVENOUS | Status: DC
  Filled 2012-11-11: qty 10

## 2012-11-11 MED ORDER — HYDROMORPHONE HCL PF 1 MG/ML IJ SOLN
INTRAMUSCULAR | Status: DC | PRN
  Administered 2012-11-11: 1 mg via INTRAVENOUS

## 2012-11-11 MED ORDER — INSULIN ASPART 100 UNIT/ML ~~LOC~~ SOLN
0.0000 [IU] | SUBCUTANEOUS | Status: DC
  Administered 2012-11-11 – 2012-11-13 (×4): 3 [IU] via SUBCUTANEOUS

## 2012-11-11 MED ORDER — PROPOFOL INFUSION 10 MG/ML OPTIME
INTRAVENOUS | Status: DC | PRN
  Administered 2012-11-11: 50 ug/kg/min via INTRAVENOUS

## 2012-11-11 MED ORDER — SODIUM CHLORIDE 0.9 % IV SOLN
INTRAVENOUS | Status: DC | PRN
  Administered 2012-11-11: 14:00:00 via INTRAVENOUS

## 2012-11-11 MED ORDER — FENTANYL CITRATE 0.05 MG/ML IJ SOLN: 25.0000 ug | INTRAMUSCULAR | Status: DC | PRN

## 2012-11-11 MED ORDER — SUCCINYLCHOLINE CHLORIDE 20 MG/ML IJ SOLN
INTRAMUSCULAR | Status: DC | PRN
  Administered 2012-11-11: 100 mg via INTRAVENOUS

## 2012-11-11 MED ORDER — METOCLOPRAMIDE HCL 5 MG/ML IJ SOLN: 10.0000 mg | Freq: Once | INTRAMUSCULAR | Status: DC | PRN

## 2012-11-11 MED ORDER — FENTANYL CITRATE 0.05 MG/ML IJ SOLN
INTRAMUSCULAR | Status: DC | PRN
  Administered 2012-11-11 (×3): 100 ug via INTRAVENOUS
  Administered 2012-11-11: 50 ug via INTRAVENOUS
  Administered 2012-11-11: 100 ug via INTRAVENOUS
  Administered 2012-11-11: 50 ug via INTRAVENOUS

## 2012-11-11 MED ORDER — METHYLENE BLUE 1 % INJ SOLN
INTRAMUSCULAR | Status: AC
  Filled 2012-11-11: qty 10

## 2012-11-11 MED ORDER — INSULIN ASPART 100 UNIT/ML ~~LOC~~ SOLN
0.0000 [IU] | SUBCUTANEOUS | Status: DC
  Administered 2012-11-11: 3 [IU] via SUBCUTANEOUS

## 2012-11-11 MED ORDER — ROCURONIUM BROMIDE 100 MG/10ML IV SOLN
INTRAVENOUS | Status: DC | PRN
  Administered 2012-11-11: 10 mg via INTRAVENOUS
  Administered 2012-11-11 (×3): 20 mg via INTRAVENOUS

## 2012-11-11 MED ORDER — SODIUM CHLORIDE 0.9 % IR SOLN
Status: DC | PRN
  Administered 2012-11-11 (×2): 1

## 2012-11-11 MED ORDER — PROPOFOL 10 MG/ML IV BOLUS
INTRAVENOUS | Status: DC | PRN
  Administered 2012-11-11: 100 mg via INTRAVENOUS

## 2012-11-11 MED ORDER — LIDOCAINE HCL (CARDIAC) 20 MG/ML IV SOLN
INTRAVENOUS | Status: DC | PRN
  Administered 2012-11-11: 40 mg via INTRAVENOUS

## 2012-11-11 MED ORDER — PHENYLEPHRINE HCL 10 MG/ML IJ SOLN
INTRAMUSCULAR | Status: DC | PRN
  Administered 2012-11-11: 80 ug via INTRAVENOUS

## 2012-11-11 MED ORDER — MIDAZOLAM BOLUS VIA INFUSION
1.0000 mg | INTRAVENOUS | Status: DC | PRN
  Filled 2012-11-11: qty 2

## 2012-11-11 MED ORDER — KCL IN DEXTROSE-NACL 20-5-0.45 MEQ/L-%-% IV SOLN
INTRAVENOUS | Status: DC
  Administered 2012-11-11: 16:00:00 via INTRAVENOUS
  Filled 2012-11-11 (×2): qty 1000

## 2012-11-11 MED ORDER — METOPROLOL TARTRATE 1 MG/ML IV SOLN
2.5000 mg | INTRAVENOUS | Status: DC | PRN
  Administered 2012-11-11: 2.5 mg via INTRAVENOUS
  Filled 2012-11-11: qty 5

## 2012-11-11 SURGICAL SUPPLY — 56 items
BLADE SURG ROTATE 9660 (MISCELLANEOUS) IMPLANT
CANISTER SUCTION 2500CC (MISCELLANEOUS) ×2 IMPLANT
CANISTER WOUND CARE 500ML ATS (WOUND CARE) ×2 IMPLANT
CATH ROBINSON RED A/P 16FR (CATHETERS) ×2 IMPLANT
CHLORAPREP W/TINT 26ML (MISCELLANEOUS) ×2 IMPLANT
CLOTH BEACON ORANGE TIMEOUT ST (SAFETY) ×2 IMPLANT
COVER SURGICAL LIGHT HANDLE (MISCELLANEOUS) ×2 IMPLANT
DRAPE LAPAROSCOPIC ABDOMINAL (DRAPES) ×2 IMPLANT
DRAPE UTILITY 15X26 W/TAPE STR (DRAPE) ×4 IMPLANT
DRAPE WARM FLUID 44X44 (DRAPE) ×2 IMPLANT
DRSG VAC ATS MED SENSATRAC (GAUZE/BANDAGES/DRESSINGS) ×2 IMPLANT
ELECT BLADE 6.5 EXT (BLADE) ×2 IMPLANT
ELECT CAUTERY BLADE 6.4 (BLADE) ×4 IMPLANT
ELECT REM PT RETURN 9FT ADLT (ELECTROSURGICAL) ×2
ELECTRODE REM PT RTRN 9FT ADLT (ELECTROSURGICAL) ×1 IMPLANT
GLOVE BIO SURGEON STRL SZ7 (GLOVE) ×4 IMPLANT
GLOVE BIO SURGEON STRL SZ7.5 (GLOVE) ×2 IMPLANT
GLOVE BIO SURGEON STRL SZ8 (GLOVE) ×4 IMPLANT
GLOVE BIOGEL PI IND STRL 7.0 (GLOVE) ×1 IMPLANT
GLOVE BIOGEL PI IND STRL 7.5 (GLOVE) ×1 IMPLANT
GLOVE BIOGEL PI IND STRL 8 (GLOVE) ×1 IMPLANT
GLOVE BIOGEL PI INDICATOR 7.0 (GLOVE) ×1
GLOVE BIOGEL PI INDICATOR 7.5 (GLOVE) ×1
GLOVE BIOGEL PI INDICATOR 8 (GLOVE) ×1
GLOVE SS BIOGEL STRL SZ 6.5 (GLOVE) ×2 IMPLANT
GLOVE SUPERSENSE BIOGEL SZ 6.5 (GLOVE) ×2
GOWN STRL NON-REIN LRG LVL3 (GOWN DISPOSABLE) ×6 IMPLANT
GOWN STRL REIN XL XLG (GOWN DISPOSABLE) ×2 IMPLANT
KIT BASIN OR (CUSTOM PROCEDURE TRAY) ×2 IMPLANT
KIT ROOM TURNOVER OR (KITS) ×2 IMPLANT
LIGASURE IMPACT 36 18CM CVD LR (INSTRUMENTS) ×2 IMPLANT
NS IRRIG 1000ML POUR BTL (IV SOLUTION) ×4 IMPLANT
PACK GENERAL/GYN (CUSTOM PROCEDURE TRAY) ×2 IMPLANT
PAD ARMBOARD 7.5X6 YLW CONV (MISCELLANEOUS) ×2 IMPLANT
PLUG CATH AND CAP STER (CATHETERS) ×2 IMPLANT
RELOAD PROXIMATE 75MM BLUE (ENDOMECHANICALS) ×4 IMPLANT
SPECIMEN JAR LARGE (MISCELLANEOUS) ×2 IMPLANT
SPONGE GAUZE 4X4 12PLY (GAUZE/BANDAGES/DRESSINGS) ×2 IMPLANT
SPONGE LAP 18X18 X RAY DECT (DISPOSABLE) ×2 IMPLANT
STAPLER GUN LINEAR PROX 60 (STAPLE) ×2 IMPLANT
STAPLER PROXIMATE 75MM BLUE (STAPLE) ×2 IMPLANT
STAPLER VISISTAT 35W (STAPLE) ×2 IMPLANT
SUCTION POOLE TIP (SUCTIONS) ×4 IMPLANT
SUT ETHILON 2 0 FS 18 (SUTURE) ×2 IMPLANT
SUT PDS AB 1 TP1 96 (SUTURE) ×4 IMPLANT
SUT SILK 2 0 SH CR/8 (SUTURE) ×2 IMPLANT
SUT SILK 2 0 TIES 10X30 (SUTURE) ×2 IMPLANT
SUT SILK 3 0 SH CR/8 (SUTURE) ×4 IMPLANT
SUT SILK 3 0 TIES 10X30 (SUTURE) ×2 IMPLANT
SUT VIC AB 3-0 SH 18 (SUTURE) IMPLANT
SYRINGE TOOMEY DISP (SYRINGE) ×2 IMPLANT
TAPE CLOTH SURG 6X10 WHT LF (GAUZE/BANDAGES/DRESSINGS) ×2 IMPLANT
TOWEL OR 17X26 10 PK STRL BLUE (TOWEL DISPOSABLE) ×2 IMPLANT
TRAY FOLEY CATH 14FRSI W/METER (CATHETERS) IMPLANT
WATER STERILE IRR 1000ML POUR (IV SOLUTION) IMPLANT
YANKAUER SUCT BULB TIP NO VENT (SUCTIONS) IMPLANT

## 2012-11-11 NOTE — Anesthesia Postprocedure Evaluation (Signed)
  Anesthesia Post-op Note  Patient: Cynthia Bowen  Procedure(s) Performed: Procedure(s): EXPLORATORY LAPAROTOMY (N/A) SMALL BOWEL RESECTION (N/A) APPLICATION OF WOUND VAC (N/A) PLACEMENT OF FEEDING JEJUNOSTOMY TUBE (N/A) LYSIS OF ADHESION (N/A)  Patient Location: ICU  Anesthesia Type:General  Level of Consciousness: sedated  Airway and Oxygen Therapy: Patient remains intubated per anesthesia plan  Post-op Pain: none  Post-op Assessment: Post-op Vital signs reviewed, Patient's Cardiovascular Status Stable and Respiratory Function Stable  Post-op Vital Signs: Reviewed and stable  Complications: No apparent anesthesia complications

## 2012-11-11 NOTE — Anesthesia Preprocedure Evaluation (Signed)
Anesthesia Evaluation  Patient identified by MRN, date of birth, ID band Patient awake    Reviewed: Allergy & Precautions, H&P , NPO status , Patient's Chart, lab work & pertinent test results, reviewed documented beta blocker date and time   Airway Mallampati: II TM Distance: >3 FB Neck ROM: full    Dental   Pulmonary pneumonia -, resolved, COPD breath sounds clear to auscultation        Cardiovascular negative cardio ROS  Rhythm:regular     Neuro/Psych  Headaches, PSYCHIATRIC DISORDERS  Neuromuscular disease negative psych ROS   GI/Hepatic Neg liver ROS, GERD-  Medicated and Controlled,  Endo/Other  negative endocrine ROS  Renal/GU negative Renal ROS  negative genitourinary   Musculoskeletal   Abdominal   Peds  Hematology negative hematology ROS (+)   Anesthesia Other Findings See surgeon's H&P   Reproductive/Obstetrics negative OB ROS                           Anesthesia Physical Anesthesia Plan  ASA: III  Anesthesia Plan: General   Post-op Pain Management:    Induction: Intravenous, Rapid sequence and Cricoid pressure planned  Airway Management Planned: Oral ETT  Additional Equipment:   Intra-op Plan:   Post-operative Plan: Extubation in OR  Informed Consent: I have reviewed the patients History and Physical, chart, labs and discussed the procedure including the risks, benefits and alternatives for the proposed anesthesia with the patient or authorized representative who has indicated his/her understanding and acceptance.   Dental Advisory Given  Plan Discussed with: CRNA and Surgeon  Anesthesia Plan Comments:         Anesthesia Quick Evaluation

## 2012-11-11 NOTE — Brief Op Note (Signed)
11/09/2012 - 11/11/2012  2:38 PM  PATIENT:  Cynthia Bowen  67 y.o. female  PRE-OPERATIVE DIAGNOSIS:  Small Bowel Obstruction/ gastrocutaneous fistula  POST-OPERATIVE DIAGNOSIS:  Small Bowel Obstruction/ gastrocutaneous fistula  PROCEDURE:  Procedure(s): EXPLORATORY LAPAROTOMY (N/A) SMALL BOWEL RESECTION (N/A) APPLICATION OF WOUND VAC (N/A) PLACEMENT OF FEEDING JEJUNOSTOMY TUBE (N/A) LYSIS OF ADHESION (N/A)  SURGEON:  Surgeon(s) and Role:    * Liz Malady, MD - Assisting    * [name]. [name], MD - Primary  PHYSICIAN ASSISTANT:   ASSISTANTS: Dr. Janee Morn    ANESTHESIA:   general  EBL:  Total I/O In: 957 [I.V.:957] Out: 670 [Urine:270; Drains:100; Blood:300]  BLOOD ADMINISTERED:375 CC PRBC  DRAINS: Feeding jejunostomy/ percutaneous drain in RUQ   LOCAL MEDICATIONS USED:  NONE  SPECIMEN:  Source of Specimen:  segment of small bowel  DISPOSITION OF SPECIMEN:  PATHOLOGY  COUNTS:  YES  TOURNIQUET:  * No tourniquets in log *  DICTATION: .Dragon Dictation  PLAN OF CARE: ICu  PATIENT DISPOSITION:  ICU - intubated and critically ill.   Delay start of Pharmacological VTE agent (>24hrs) due to surgical blood loss or risk of bleeding: yes  [name]. [name], MD, [facility] Surgery  General/ Trauma Surgery  11/11/2012 2:41 PM

## 2012-11-11 NOTE — Progress Notes (Signed)
Subjective: Patient seems much more tender and uncomfortable today. Tachycardic WBC climbing   Objective: Vital signs in last 24 hours: Temp:  [98.5 F (36.9 C)-100.2 F (37.9 C)] 99.1 F (37.3 C) (07/22 0527) Pulse Rate:  [106-146] 146 (07/22 0527) Resp:  [20] 20 (07/22 0527) BP: (129-157)/(65-84) 133/73 mmHg (07/22 0527) SpO2:  [100 %] 100 % (07/22 0527) Last BM Date: 11/09/12  Intake/Output from previous day: 07/21 0701 - 07/22 0700 In: 4213 [I.V.:2266; IV Piggyback:50; TPN:1897] Out: 2320 [Urine:1750; Drains:570] Intake/Output this shift:    General appearance: alert, cooperative and very uncomfortable Resp: clear to auscultation bilaterally Cardio: tachycardic/ regular GI: rigid; exquisitely tender in lower abdomen; distended; quiet milky drainage from drain and from gastrocutaneous fistula  Lab Results:   Recent Labs  11/10/12 0635 11/11/12 0530  WBC 34.3* 39.3*  HGB 9.9* 8.4*  HCT 30.4* 25.2*  PLT 489* 417*   BMET  Recent Labs  11/10/12 0635 11/11/12 0530  NA 129* 130*  K 4.9 3.8  CL 95* 99  CO2 21 20  GLUCOSE 159* 180*  BUN 16 20  CREATININE 0.60 0.56  CALCIUM 9.5 8.9   PT/INR No results found for this basename: LABPROT, INR,  in the last 72 hours ABG No results found for this basename: PHART, PCO2, PO2, HCO3,  in the last 72 hours  Studies/Results: Ct Abdomen Pelvis W Contrast  11/10/2012   *RADIOLOGY REPORT*  Clinical Data: Abdominal pain all over.  Drain in place. Known gastrocutaneous fistula.  CT ABDOMEN AND PELVIS WITH CONTRAST  Technique:  Multidetector CT imaging of the abdomen and pelvis was performed following the standard protocol during bolus administration of intravenous contrast.  Contrast: 80mL OMNIPAQUE IOHEXOL 300 MG/ML  SOLN  Comparison: 11/01/2012  Findings: Small left pleural effusion with basilar atelectasis. This is stable.  Pericardial effusion is stable.  Right upper quadrant pigtail drainage catheter. Small amount of  residual fluid around the catheter and extending underneath the liver along the catheter tract.  Gas along the catheter tract.  No significant change.  There is inflammatory thickening of the stomach wall with infiltration extending from the stomach to the skin surface.  Contrast material within the stripe consistent with gastrocutaneous fistula.  This also appears stable.  There is free fluid in the upper abdomen around the liver edge and around the spleen and extending down to the pelvis.  Density measurements are indeterminate.  Infected fluid or hemorrhagic fluid is not excluded.  No free intra-abdominal air.  Pelvic small bowel loops demonstrate mild dilatation with wall thickening.  This is new since the previous study.  Infiltration in the mesentery around these loops.  Findings could represent gastroenteritis or small bowel ischemia.  The liver, spleen, gallbladder, pancreas, adrenal glands, inferior vena cava, and retroperitoneal lymph nodes are unremarkable. Calcification of the abdominal aorta without aneurysm.  Multiple cysts and stones in the kidneys without change since previous study.  No hydronephrosis in either kidney.  The stomach is not abnormally distended.  The colon is decompressed.  Pelvis:  No bladder wall thickening.  Uterus appears to be surgically absent.  No abnormal adnexal masses.  No evidence of diverticulitis.  Appendix is not identified.  Degenerative changes in the spine.  No destructive bone lesions appreciated.  IMPRESSION: Stable appearance of right upper quadrant drainage catheter. Stable fistula from stomach to skin surface.  Contrast material is demonstrated within the fistula.  There is developing free fluid in the abdomen and pelvis with developing mild pelvic small  bowel distension and wall thickening.  Changes may represent developing enteritis versus developing bowel ischemia.  Small bowel ischemia should be excluded.  Pericardial effusion and small left pleural effusion  as previously.  Results were discussed with the patient's nurse on six N surgical, Clydie Braun, at 0246 hours on 11/10/2012.   Original Report Authenticated By: Burman Nieves, M.D.   Dg Abd Acute W/chest  11/09/2012   *RADIOLOGY REPORT*  Clinical Data: Abdominal pain, nausea and diarrhea.  ACUTE ABDOMEN SERIES (ABDOMEN 2 VIEW & CHEST 1 VIEW)  Comparison: Single view of the chest 08/09/2012 and CT abdomen and pelvis 11/01/2012.  Findings: Single view of the chest demonstrates trace bilateral pleural effusions and mild basilar atelectasis.  Right PICC remains in place.  Heart size is normal.  No pneumothorax.  Two views of the abdomen demonstrate a pigtail catheter in place with the tip just to the right of midline.  Gas filled loops of small bowel measure up to 3.3 cm and air fluid levels are present. No free intraperitoneal air is identified.  Three calcifications in the right upper quadrant consistent with nonobstructing right renal stones are seen as on prior CT.  IMPRESSION:  1.  Bowel gas pattern most compatible with small bowel obstruction. 2.  Trace bilateral pleural effusions and mild basilar atelectasis. 3.  Three nonobstructing stones right kidney as seen on prior CT. 4.  Pigtail catheter tip projects just to the right of midline of the abdomen.   Original Report Authenticated By: Holley Dexter, M.D.    Anti-infectives: Anti-infectives   Start     Dose/Rate Route Frequency Ordered Stop   11/10/12 1000  piperacillin-tazobactam (ZOSYN) IVPB 3.375 g     3.375 g 12.5 mL/hr over 240 Minutes Intravenous 3 times per day 11/10/12 9147        Assessment/Plan: s/p * No surgery found * IMP:  peritonitis possibly from intra-abdominal infection or ischemic bowel Plan:  Exploratory laparotomy, possible bowel resection, possible feeding tube placement.  The surgical procedure has been discussed with the patient.  Potential risks, benefits, alternative treatments, and expected outcomes have been explained.   All of the patient's questions at this time have been answered.  The patient has obvious reluctance to proceed with surgery, due to her multiple complications.  However, I explained that she has peritonitis and has something either infected or ischemic in her abdomen.  If we don't operate soon, she may not survive.  The patient understand the proposed surgical procedure and wishes to proceed.   LOS: 2 days    Cynthia Bowen K. 11/11/2012

## 2012-11-11 NOTE — Op Note (Signed)
Preop diagnosis: #1 peritonitis #2 gastrocutaneous fistula Postop diagnosis: #1 closed loop small bowel obstruction with infarcted small bowel #2 gastrocutaneous fistula Procedure: Exploratory laparotomy Lysis of adhesions Small bowel resection Placement of feeding jejunostomy Placement of negative pressure wound dressing Surgeon:Twilla Khouri K. Assistant: Dr. Violeta Gelinas Anesthesia: Gen. Endotracheal Indications: This is a 67 year old female who underwent open repair of a small ventral hernia as well as a small full-thickness gastric wall biopsy in March of 2014. The patient was severely malnourished and the closure of the gastric biopsy with omental patch failed repeatedly. She has developed a chronic gastrocutaneous fistula. She has been home with TNA via a PICC line. She has a percutaneous drain in the right upper quadrant for an abscess above her liver.  Her course has also been complicated by a wound infection as well as the development of bilateral pulmonary emboli.  She was admitted through the emergency department 2 nights ago with lower abdominal pain.  She was felt to have either gastroenteritis or mild ischemia of the bowel. However her symptoms have worsened and she has become more tachycardic. We will bring her to the operating room now for exploratory laparotomy.  Description of procedure: The patient brought to the operating room and placed in the supine position on the operating room table. After an adequate level of general anesthesia was obtained a Foley catheter was placed under sterile technique. The patient's abdomen was prepped with chlor prep and draped in sterile fashion. We prepped her gastric anus fistula as well as the drain into the field. We made a lower midline incision. Dissection was carried down to the fascia. We incised the linea alba vertically and entered the peritoneal cavity bluntly. There was some cloudy fluid within the abdomen but no gross sign of  perforation. We extended our incision inferiorly until we were able to examine the small bowel. The patient has obvious dead bowel within the pelvis but no sign of perforation. We were able to carefully deliver the small bowel up into the wound. There is a narrow band of adhesions of the appendices epiploica adherent down into the pelvis. This is caused a closed loop small bowel obstruction with infarction of a 30 centimeter segment of small bowel. We reduced the obstruction. The small bowel is obviously beyond the point of recovery. We resected the segment of small bowel with GIA-75 staplers. The  Mesentery was then divided with the LigaSure device. The small bowel sent for pathologic examination. We created a side-to-side anastomosis with a GIA-75 stapler and a TA 60 stapler. The mesenteric defect was closed with 2-0 silk.  A 3-0 silk was placed at the crotch of the anastomosis for further reinforcement. We then examined the remainder of the small bowel which all appeared viable and free of any injury or defect. We then spent some time taking down the adhesions of the gastric anus fistula to the anterior abdominal wall. There was a lot of Novafil suture material that was sticking up into the posterior surface of the chronic abdominal wound. We removed all the sutures. We continued dissecting up to encountered the edge of the liver which was densely adherent to the anterior abdominal wall. We could see our Cheree Ditto patch over the antrum of the stomach. This seemed to be intact and relatively healthy. I can palpate the fundus of the stomach which appeared soft and healthy. Anesthesia passed a nasogastric tube which was the palpated within the stomach. We then instilled about 200 cc of methylene blue mixed  with saline into the stomach. We then observed carefully for any leaking in the peritoneal cavity. We never saw any blue dye. The drain was examined and no blue dye was seen within the drain. Due to the amount of  scarring and inflammation around the stomach we decide not to do any further dissection of this area. We excised some of the chronic granulation tissue from the edge of the fascia and abdominal wall.  We then created a 16 French red rubber catheter Stamm jejunostomy. The feeding tube was brought out through stab incision in the left upper quadrant. We selected a segment of small bowel about 20 cm distal to ligament of Treitz. We placed a Pershing suture of 3-0 silk. The enterotomy was made with the cautery. The tube was advanced into the small bowel and the pursestring suture was used to secure it. We then created a Witzel tunnel with 3-0 silk sutures to imbricate the feeding tube. We then secured the jejunum to the anterior abdominal wall with 3-0 silk sutures. We irrigated thoroughly. We then closed the fascia with double-stranded #1 PDS suture.  The subcutaneous tissues were irrigated. We placed a wound VAC the subcutaneous tissues. This was placed to suction. There was a good seal. The patient was then transported to the intensive care unit critical condition. All sponge, initially, and needle counts are correct.  [name]. [name], MD, [facility] Surgery  General/ Trauma Surgery  11/11/2012 3:47 PM

## 2012-11-11 NOTE — Progress Notes (Signed)
PARENTERAL NUTRITION CONSULT NOTE - FOLLOW UP  Pharmacy Consult:  TPN Indication:  Gastrocutaneous fistula  Allergies  Allergen Reactions  . Avelox (Moxifloxacin Hcl In Nacl) Nausea And Vomiting  . Betadine (Povidone Iodine) Itching and Rash  . Alendronate Sodium Nausea And Vomiting  . Aspirin Nausea Only  . Codeine Nausea And Vomiting  . Doxycycline     Pt doesn't remember reaction  . Fluconazole     Pt doesn't remember reaction  . Hydrocodone Nausea And Vomiting    GI distress  . Neurontin (Gabapentin) Other (See Comments)    Mood changes   . Nsaids Other (See Comments)    Severe gastritis & perforation - avoid NSAIDs when possible  . Sertraline Hcl Other (See Comments)    Hallucinations   . Sulfa Antibiotics Rash    Patient Measurements: Height: 5' 4.96" (165 cm) Weight: 95 lb 14.4 oz (43.5 kg) IBW/kg (Calculated) : 56.91  Vital Signs: Temp: 99.1 F (37.3 C) (07/22 0527) Temp src: Oral (07/22 0527) BP: 133/73 mmHg (07/22 0527) Pulse Rate: 146 (07/22 0527) Intake/Output from previous day: 07/21 0701 - 07/22 0700 In: 4213 [I.V.:2266; IV Piggyback:50; TPN:1897] Out: 2320 [Urine:1750; Drains:570]  Labs:  Recent Labs  11/09/12 1520 11/09/12 1545 11/10/12 0635 11/11/12 0530  WBC 20.3*  --  34.3* 39.3*  HGB 9.7* 10.9* 9.9* 8.4*  HCT 28.3* 32.0* 30.4* 25.2*  PLT 531*  --  489* 417*     Recent Labs  11/09/12 1520 11/09/12 1545 11/10/12 0635 11/11/12 0530  NA 134* 138 129* 130*  K 3.6 3.7 4.9 3.8  CL 98 104 95* 99  CO2 22  --  21 20  GLUCOSE 156* 163* 159* 180*  BUN 18 17 16 20   CREATININE 0.47* 0.40* 0.60 0.56  CALCIUM 9.5  --  9.5 8.9  MG  --   --  1.8  --   PHOS  --   --  3.2  --   PROT 8.6*  --  8.4* 6.9  ALBUMIN 2.9*  --  2.6* 2.0*  AST 15  --  14 12  ALT 9  --  8 6  ALKPHOS 106  --  100 78  BILITOT 0.2*  --  0.3 0.2*  PREALBUMIN  --   --  13.5*  --   TRIG  --   --  88  --    Estimated Creatinine Clearance: 48.1 ml/min (by C-G formula  based on Cr of 0.56).   No results found for this basename: GLUCAP,  in the last 72 hours     Insulin Requirements in the past 24 hours:  None - not on SSI based on history of controlled CBGs while on TPN  Assessment: 29 YOF with history of gastrocutaneous fistula on chronic TPN admitted with abdominal pain, and imaging showed small bowel dilatation concerning for partial SBO.  Pharmacy consulted to manage TPN.  Noted plan for ex-lap with possible bowel resection and feeding tube placement.  GI: hx GERD.  Chronic TPN for gastrocutaneous fistula - LFTs WNL, on PPI IV.  Prealbumin low at 13.5.  Endo: no hx - CBGs trending up (156-180) Lytes: low Na, others WNL - TPN may worsen hyponatremia Renal: SCr 0.56 (stable), CrCL 48 ml/min, D51/2 with KCL at 125 ml/hr, excellent UOP Pulm: hx COPD - stable on RA Cards: no hx - BP improving, worsening tachycardia, TG 88 Hepatobil: LFTs WNL Neuro: hx depression / neuromuscular disorder / fibromyalgia - Benadryl, fentanyl patch, Prozac AC:  Xarelto for hx PE, dose appropriate, hgb low but stable, thrombocytopenia improving, no bleeding ID: Zosyn D#2 (7/21 >> ), empiric for leukocytosis + possible gastroenteritis, afebrile, WBC trended up to 39.3, LA 3 >> 2.7, UA unimpressive Best Practices: Xarelto, home meds addressed TPN Access:  PICC line TPN day#: continued from home    Current Nutrition:  Home cyclic TPN: 78 gm protein/d, 312 gm dextrose/d, 50 gm lipids MWF.  Total kCal/d based on weekly average = 1659.  MVI MWF, Addamel 2.35mL daily, no insulin 12 hr cycle   Nutritional Goals:  1650 - 1700 kCal, 75-85 grams of protein per day   Plan:  - Cycle Clinimix E 5/20 over 12 hours: 50 ml/hr x 1 hr, 158 ml/hr x 10 hrs, 50 ml/hr x 1 hr.  Clinimix and lipids MWF will provide a weekly average of 1684 kCal/d and 84gm of protein per day. - Lipids 20 ml/hr x 12 hrs on MWF - Daily multivitamin and trace elements - Zinc and Vit C daily - Start  SSI - F/U with surgery plan and transition to TF if appropriate      Whitley Patchen D. Laney Potash, PharmD, BCPS Pager:  [phone] 11/11/2012, 9:06 AM

## 2012-11-11 NOTE — Consult Note (Signed)
WOC follow-up: Pt states her pouch leaked last night from fistula site.  Offered to apply new Eakin pouch but she plans on going to surgery this AM for abd exploration.  Will re-assess need for pouching system tomorrow when post-op. Cammie Mcgee MSN, RN, CWOCN, [place], CNS [phone]

## 2012-11-11 NOTE — Progress Notes (Signed)
50 cc/50mg  IV versed wasted down sink d/t sedation order change. Darnelle Going, RN to witness waste.

## 2012-11-11 NOTE — Consult Note (Signed)
PULMONARY  / CRITICAL CARE MEDICINE  Name: Cynthia Bowen MRN: [id number] DOB: November 19, 1945    ADMISSION DATE:  11/09/2012 CONSULTATION DATE:  11/11/2012  REFERRING MD :  CCS PRIMARY SERVICE: CCS  CHIEF COMPLAINT:  VDRF  BRIEF PATIENT DESCRIPTION: 67 year old female with PMH of COPD and multiple abdominal surgeries who presents to the ICU post abdominal surgery due to 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  Patient has a wound vac in place and was transferred to the ICU post op for post surgical care.  PCCM was consulted for vent and medical management.  SIGNIFICANT EVENTS / STUDIES:  7/22 surgical repair as above.  LINES / TUBES: ET tube 7/22>>> PIV Foley 7/22>>>  CULTURES: Blood 7/22>>> Urine 7/22>>> Sputum 7/22>>>  ANTIBIOTICS: Zosyn 7/22>>>  PAST MEDICAL HISTORY :  Past Medical History  Diagnosis Date  . COPD (chronic obstructive pulmonary disease)   . Pneumonia 12-2011  . GERD (gastroesophageal reflux disease)   . Headache(784.0)   . Arthritis     osteoarthritis  . Allergy   . Depression   . Neuromuscular disorder   . Osteoporosis   . Bronchitis     CURRENTLY AS OF 06/30/12 - HAS COUGH AND FINISHED ANTIBIOTIC FOR BRONCHITIS  . Fibromyalgia   . Pain     ABDOMINAL PAIN AND NAUSEA  . Pain     SOMETIMES PAIN RIGHT EAR AND NECK--STATES CAUSED BY A "LUMP" ON BACK OF EAR--USES KENALOG CREAM TOPICALLY AS NEEDED.  Marland Kitchen Gastrocutaneous fistula    Past Surgical History  Procedure Laterality Date  . Abdominal hysterectomy    . Esophagogastroduodenoscopy  04/18/2012    Procedure: ESOPHAGOGASTRODUODENOSCOPY (EGD);  Surgeon: [name], MD;  Location: [facility];  Service: Endoscopy;  Laterality: N/A;  . Eus  05/29/2012    Procedure: UPPER ENDOSCOPIC ULTRASOUND (EUS) LINEAR;  Surgeon: [name], MD;  Location: [facility];  Service: Endoscopy;  Laterality: N/A;  . Appendectomy    . Spine surgery      CERVICAL  SPINE SURGERY X 2 - INCLUDING FUSION; LUMBAR SURGERY FOR RUPTURED DISC  . Eye surgery      BILATERAL CATARACT EXTRACTIONS  . Laparoscopic abdominal exploration N/A 07/02/2012    Procedure: converted to laparotomy with gastric biopsy;  Surgeon: [name]. [name], MD;  Location: [facility];  Service: General;  Laterality: N/A;  Laparoscopic Gastric Biospy attempted.   . Laparotomy N/A 07/07/2012    Procedure: EXPLORATORY LAPAROTOMY repair of gastric perforation with omental graham patch, drainage of abdominal abcess;  Surgeon: [name]. [name], MD;  Location: [facility];  Service: General;  Laterality: N/A;  . Stomach surgery  07/07/2012    Omental patch of gastric perforation  . Laparotomy N/A 07/13/2012    Procedure: EXPLORATORY LAPAROTOMY repair gastric leak;  Surgeon: Mariella Saa, MD;  Location: [facility];  Service: General;  Laterality: N/A;   Prior to Admission medications   Medication Sig Start Date End Date Taking? Authorizing Provider  albuterol (PROVENTIL HFA;VENTOLIN HFA) 108 (90 BASE) MCG/ACT inhaler Inhale 2 puffs into the lungs every 4 (four) hours as needed for wheezing or shortness of breath.   Yes Historical Provider, MD  diphenhydrAMINE (BENADRYL) 12.5 MG/5ML liquid Take 12.5 mg by mouth 4 (four) times daily.    Yes Historical Provider, MD  EPINEPHrine (EPIPEN) 0.3 mg/0.3 mL SOAJ Inject 0.3 mg into the muscle daily as needed (anaphylaxis).   Yes Historical Provider, MD  fentaNYL (DURAGESIC - DOSED MCG/HR)  50 MCG/HR Place 1 patch (50 mcg total) onto the skin every 3 (three) days. 09/19/12  Yes [name]. Tsuei, MD  fluconazole (DIFLUCAN) 40 MG/ML suspension Take 200 mg by mouth every 7 (seven) days. 4 week course - takes on Saturdays   Yes Historical Provider, MD  FLUoxetine (PROZAC) 20 MG/5ML solution Take 5 mLs (20 mg total) by mouth daily. 09/19/12  Yes [name]. Tsuei, MD  nystatin-triamcinolone ointment (MYCOLOG) Apply topically 2 (two) times daily. 10/27/12  Yes [name]. Tsuei, MD   ondansetron (ZOFRAN-ODT) 4 MG disintegrating tablet Take 4 mg by mouth 2 (two) times daily as needed for nausea. Takes with percocet   Yes Historical Provider, MD  oxyCODONE-acetaminophen (PERCOCET/ROXICET) 5-325 MG per tablet Take 1 tablet by mouth every 4 (four) hours as needed for pain. 10/27/12  Yes [name]. Tsuei, MD  Rivaroxaban (XARELTO) 20 MG TABS Take 20 mg by mouth daily with supper.   Yes Historical Provider, MD   Allergies  Allergen Reactions  . Avelox (Moxifloxacin Hcl In Nacl) Nausea And Vomiting  . Betadine (Povidone Iodine) Itching and Rash  . Alendronate Sodium Nausea And Vomiting  . Aspirin Nausea Only  . Codeine Nausea And Vomiting  . Doxycycline     Pt doesn't remember reaction  . Fluconazole     Pt doesn't remember reaction  . Hydrocodone Nausea And Vomiting    GI distress  . Neurontin (Gabapentin) Other (See Comments)    Mood changes   . Nsaids Other (See Comments)    Severe gastritis & perforation - avoid NSAIDs when possible  . Sertraline Hcl Other (See Comments)    Hallucinations   . Sulfa Antibiotics Rash    FAMILY HISTORY:  Family History  Problem Relation Age of Onset  . COPD Mother   . Hypertension Maternal Grandmother    SOCIAL HISTORY:  reports that she quit smoking about 7 years ago. Her smoking use included Cigarettes. She started smoking about 9 years ago. She has a 17.5 pack-year smoking history. She has never used smokeless tobacco. She reports that she does not drink alcohol or use illicit drugs.  REVIEW OF SYSTEMS:  Sedated and intubated.  SUBJECTIVE: Sedated and intubated.  No events.  VITAL SIGNS: Temp:  [99.1 F (37.3 C)-100.2 F (37.9 C)] 99.4 F (37.4 C) (07/22 1530) Pulse Rate:  [120-146] 122 (07/22 1530) Resp:  [14-20] 14 (07/22 1530) BP: (130-157)/(65-88) 135/88 mmHg (07/22 1530) SpO2:  [100 %] 100 % (07/22 1530) FiO2 (%):  [100 %] 100 % (07/22 1530) HEMODYNAMICS:   VENTILATOR SETTINGS: Vent Mode:  [-] PRVC FiO2  (%):  [100 %] 100 % Set Rate:  [14 bmp] 14 bmp Vt Set:  [460 mL] 460 mL PEEP:  [5 cmH20] 5 cmH20 INTAKE / OUTPUT: Intake/Output     07/21 0701 - 07/22 0700 07/22 0701 - 07/23 0700   P.O. 0    I.V. (mL/kg) 2266 (52.1) 1920.7 (44.2)   Blood  350   IV Piggyback 50    TPN 1897    Total Intake(mL/kg) 4213 (96.9) 2270.7 (52.2)   Urine (mL/kg/hr) 1750 (1.7) 350 (0.9)   Drains 570 (0.5) 100 (0.2)   Blood  300 (0.7)   Total Output 2320 750   Net +1893 +1520.7         PHYSICAL EXAMINATION: General: Chronically ill appearing female. Neuro: Sedated and intubated, withdraw all ext to command. HEENT: [place]/AT, PERRL, EOM-I and MMM. Cardiovascular:  RRR, Nl S1/S2, -M/R/G. Lungs: Bibasilar crackles. Abdomen:  Soft, diffusely tender, dressing in place, neg BS. Musculoskeletal:  -edema and -tenderness. Skin:  Skin incision noted, clean, wound vac in place, otherwise intact.  LABS:  Recent Labs Lab 11/09/12 1520 11/09/12 1545 11/10/12 0635 11/11/12 0530  HGB 9.7* 10.9* 9.9* 8.4*  WBC 20.3*  --  34.3* 39.3*  PLT 531*  --  489* 417*  NA 134* 138 129* 130*  K 3.6 3.7 4.9 3.8  CL 98 104 95* 99  CO2 22  --  21 20  GLUCOSE 156* 163* 159* 180*  BUN 18 17 16 20   CREATININE 0.47* 0.40* 0.60 0.56  CALCIUM 9.5  --  9.5 8.9  MG  --   --  1.8  --   PHOS  --   --  3.2  --   AST 15  --  14 12  ALT 9  --  8 6  ALKPHOS 106  --  100 78  BILITOT 0.2*  --  0.3 0.2*  PROT 8.6*  --  8.4* 6.9  ALBUMIN 2.9*  --  2.6* 2.0*  LATICACIDVEN  --  3.03* 2.7* 2.5*    Recent Labs Lab 11/11/12 1141  GLUCAP 107*    CXR: Pending  ASSESSMENT / PLAN:  PULMONARY A: VDRF post op, COPD history. P:   - CXR portable. - ABG now. - AM ABG and CXR. - Full vent support for now, vent adjusted.  CARDIOVASCULAR A: HTN and sinus tach. P:  - Low dose beta blockers with holding parameters. - Gentle hydration. - Monitor.  RENAL A:  Hyponatremia. P:   - IVF to NS 75 ml/hr. - F/U BMET. - Check Mg and  Phos.  GASTROINTESTINAL A:  Post-op. P:   - NPO for now. - If unable to take PO will likely start TPN relatively soon.  HEMATOLOGIC A:  Leukocytosis, likely a stress response. P:  - F/U CBC. - Transfuse if Hg<7.  INFECTIOUS A:  No active infection for now. P:   - Pan culture. - Zosyn. - Will f/u.  ENDOCRINE A:  DM by history   P:   - SSI. - CBGs.  NEUROLOGIC A:  Depression, sedated post op. P:   - Propofol and fentanyl drip. - If not ready for extubation in AM will change propofol to versed.  TODAY'S SUMMARY: 67 year old female postop for abdominal surgery, intubated and sedated, if unable to get off the vent by AM will consider changing to versed/fentanyl.  I have personally obtained a history, examined the patient, evaluated laboratory and imaging results, formulated the assessment and plan and placed orders.  CRITICAL CARE: The patient is critically ill with multiple organ systems failure and requires high complexity decision making for assessment and support, frequent evaluation and titration of therapies, application of advanced monitoring technologies and extensive interpretation of multiple databases. Critical Care Time devoted to patient care services described in this note is 45 minutes.   Alyson Reedy, M.D. Pulmonary and Critical Care Medicine [facility] Pager: [phone]  11/11/2012, 4:13 PM

## 2012-11-11 NOTE — Preoperative (Signed)
Beta Blockers   Reason not to administer Beta Blockers:Not Applicable 

## 2012-11-11 NOTE — Transfer of Care (Signed)
Immediate Anesthesia Transfer of Care Note  Patient: Cynthia Bowen  Procedure(s) Performed: Procedure(s): EXPLORATORY LAPAROTOMY (N/A) SMALL BOWEL RESECTION (N/A) APPLICATION OF WOUND VAC (N/A) PLACEMENT OF FEEDING JEJUNOSTOMY TUBE (N/A) LYSIS OF ADHESION (N/A)  Patient Location: SICU  Anesthesia Type:General  Level of Consciousness: Patient remains intubated per anesthesia plan  Airway & Oxygen Therapy: Patient remains intubated per anesthesia plan  Post-op Assessment: Report given to PACU RN and Post -op Vital signs reviewed and stable  Post vital signs: Reviewed and stable  Complications: No apparent anesthesia complications

## 2012-11-11 NOTE — OR Nursing (Signed)
Foley inserted by Trellis Paganini, RN with Scrub Care 3.3% Chloroxylenol Emollient Cleansing Solution instead of betadine solution due to documented betadine allergy.  Oralia Manis, RN

## 2012-11-12 ENCOUNTER — Inpatient Hospital Stay (HOSPITAL_COMMUNITY): Payer: Medicare Other

## 2012-11-12 ENCOUNTER — Encounter (HOSPITAL_COMMUNITY): Payer: Self-pay | Admitting: Surgery

## 2012-11-12 DIAGNOSIS — R630 Anorexia: Secondary | ICD-10-CM

## 2012-11-12 LAB — GLUCOSE, CAPILLARY
Glucose-Capillary: 113 mg/dL — ABNORMAL HIGH (ref 70–99)
Glucose-Capillary: 99 mg/dL (ref 70–99)
Glucose-Capillary: 93 mg/dL (ref 70–99)
Glucose-Capillary: 102 mg/dL — ABNORMAL HIGH (ref 70–99)

## 2012-11-12 LAB — CBC
HCT: 27.8 % — ABNORMAL LOW (ref 36.0–46.0)
MCH: 24.3 pg — ABNORMAL LOW (ref 26.0–34.0)
MCV: 74.3 fL — ABNORMAL LOW (ref 78.0–100.0)
Platelets: 324 10*3/uL (ref 150–400)
RBC: 3.74 MIL/uL — ABNORMAL LOW (ref 3.87–5.11)

## 2012-11-12 LAB — BASIC METABOLIC PANEL
BUN: 21 mg/dL (ref 6–23)
CO2: 21 mEq/L (ref 19–32)
Calcium: 8.2 mg/dL — ABNORMAL LOW (ref 8.4–10.5)
Creatinine, Ser: 0.53 mg/dL (ref 0.50–1.10)

## 2012-11-12 LAB — BLOOD GAS, ARTERIAL
Bicarbonate: 21.8 mEq/L (ref 20.0–24.0)
Drawn by: 39866
MECHVT: 460 mL
O2 Saturation: 97.7 %
PEEP: 5 cmH2O
Patient temperature: 98.6
RATE: 14 resp/min
pH, Arterial: 7.38 (ref 7.350–7.450)

## 2012-11-12 LAB — MAGNESIUM: Magnesium: 1.7 mg/dL (ref 1.5–2.5)

## 2012-11-12 MED ORDER — FENTANYL CITRATE 0.05 MG/ML IJ SOLN
INTRAMUSCULAR | Status: AC
  Filled 2012-11-12: qty 2

## 2012-11-12 MED ORDER — SODIUM GLYCEROPHOSPHATE 1 MMOLE/ML IV SOLN
10.0000 mmol | Freq: Once | INTRAVENOUS | Status: AC
  Administered 2012-11-12: 10 mmol via INTRAVENOUS
  Filled 2012-11-12: qty 10

## 2012-11-12 MED ORDER — FAT EMULSION 20 % IV EMUL
250.0000 mL | INTRAVENOUS | Status: AC
  Administered 2012-11-12: 250 mL via INTRAVENOUS
  Filled 2012-11-12: qty 250

## 2012-11-12 MED ORDER — SODIUM PHOSPHATE 3 MMOLE/ML IV SOLN
10.0000 mmol | Freq: Once | INTRAVENOUS | Status: DC
  Filled 2012-11-12: qty 3.33

## 2012-11-12 MED ORDER — FENTANYL CITRATE 0.05 MG/ML IJ SOLN
25.0000 ug | INTRAMUSCULAR | Status: DC | PRN
  Administered 2012-11-12 – 2012-11-14 (×32): 100 ug via INTRAVENOUS
  Filled 2012-11-12 (×33): qty 2

## 2012-11-12 MED ORDER — FENTANYL BOLUS VIA INFUSION
25.0000 ug | INTRAVENOUS | Status: DC | PRN
  Filled 2012-11-12: qty 100

## 2012-11-12 MED ORDER — CHLORHEXIDINE GLUCONATE 0.12 % MT SOLN
OROMUCOSAL | Status: AC
  Administered 2012-11-12: 15 mL
  Filled 2012-11-12: qty 15

## 2012-11-12 MED ORDER — ZINC TRACE METAL 1 MG/ML IV SOLN
INTRAVENOUS | Status: AC
  Administered 2012-11-12: 17:00:00 via INTRAVENOUS
  Filled 2012-11-12: qty 2000

## 2012-11-12 MED ORDER — IOHEXOL 300 MG/ML  SOLN
50.0000 mL | Freq: Once | INTRAMUSCULAR | Status: AC | PRN
  Administered 2012-11-12: 5 mL via INTRAVENOUS

## 2012-11-12 MED ORDER — ENOXAPARIN SODIUM 60 MG/0.6ML ~~LOC~~ SOLN
45.0000 mg | Freq: Two times a day (BID) | SUBCUTANEOUS | Status: DC
  Administered 2012-11-12 – 2012-11-15 (×7): 45 mg via SUBCUTANEOUS
  Administered 2012-11-16: 22:00:00 via SUBCUTANEOUS
  Administered 2012-11-16 – 2012-11-18 (×4): 45 mg via SUBCUTANEOUS
  Filled 2012-11-12 (×15): qty 0.6

## 2012-11-12 MED ORDER — MAGNESIUM SULFATE 40 MG/ML IJ SOLN
2.0000 g | Freq: Once | INTRAMUSCULAR | Status: AC
  Administered 2012-11-12: 2 g via INTRAVENOUS
  Filled 2012-11-12: qty 50

## 2012-11-12 NOTE — Procedures (Signed)
Successful exchange of right arm PICC.  A triple lumen PICC has been placed.  Tip near SVC/RA junction.  2 of 3 lumens aspirate, all flush well.

## 2012-11-12 NOTE — Consult Note (Signed)
WOC follow-up: Pt went to OR yesterday and no longer has [place] pouch to fistula site; vac intact to full thickness wound.  Progress notes state surgeon plans to change first post-op dressing tomorrow at bedside.  Bedside nurses can change after that day on a Tues/Thurs/Sat schedule. Please re-consult if further assistance is needed.  Thank-you,  Cammie Mcgee MSN, RN, CWOCN, [place], CNS [phone]

## 2012-11-12 NOTE — Progress Notes (Signed)
1 Day Post-Op  Subjective: Patient intubated, sedated some Soft wrist restraints to prevent self-extubation Still tachycardic, but less than yesterday  Jejunostomy is proximal to the new SB anastomosis, so we will need to wait a few days before feeding via j-tube  Patient has recent history of bilateral PE's, and was on Xarelto at home   Objective: Vital signs in last 24 hours: Temp:  [99.3 F (37.4 C)-99.6 F (37.6 C)] 99.3 F (37.4 C) (07/23 0519) Pulse Rate:  [72-131] 110 (07/23 0700) Resp:  [14-25] 14 (07/23 0700) BP: (91-155)/(54-88) 93/56 mmHg (07/23 0700) SpO2:  [100 %] 100 % (07/23 0700) FiO2 (%):  [40 %-100 %] 40 % (07/23 0331) Weight:  [102 lb 4.7 oz (46.4 kg)] 102 lb 4.7 oz (46.4 kg) (07/23 0500) Last BM Date: 11/09/12  Intake/Output from previous day: 07/22 0701 - 07/23 0700 In: 5904.8 [I.V.:3557.3; Blood:350; NG/GT:30; IV Piggyback:250; TPN:1717.5] Out: 2477 [Urine:1642; Emesis/NG output:350; Drains:185; Blood:300] Intake/Output this shift:    General appearance: intubated, sedated, mildly interactive GI: soft, much less tender Drain - cloudy output; midline VAC with good seal  Lab Results:   Recent Labs  11/11/12 1800 11/12/12 0248  WBC 47.7* 36.0*  HGB 13.1 9.1*  HCT 37.0 27.8*  PLT 384 324   BMET  Recent Labs  11/11/12 1800 11/12/12 0248  NA 131* 131*  K 4.6 3.9  CL 97 103  CO2 21 21  GLUCOSE 102* 201*  BUN 17 21  CREATININE 0.62 0.53  CALCIUM 9.3 8.2*   PT/INR No results found for this basename: LABPROT, INR,  in the last 72 hours ABG  Recent Labs  11/11/12 1751 11/12/12 0500  PHART 7.389 7.380  HCO3 21.4 21.8    Studies/Results: Dg Chest Port 1 View  11/11/2012   *RADIOLOGY REPORT*  Clinical Data: Endotracheal tube placement.  PORTABLE CHEST - 1 VIEW  Comparison: November 09, 2012.  Findings: Right-sided PICC line is unchanged in position. Nasogastric tube is seen passing through esophagus into stomach. Cardiomediastinal  silhouette appears normal.  Increased linear opacity is noted laterally in left lung base concerning for subsegmental atelectasis.  Minimal subsegmental atelectasis is noted laterally in right lung base.  Endotracheal tube is now seen projected over tracheal air shadow with distal tip approximately 5 cm above the carina.  Bony thorax is intact.  IMPRESSION: Endotracheal tube in grossly good position.  Increased bilateral basilar subsegmental atelectasis is noted compared to prior exam.   Original Report Authenticated By: Lupita Raider.,  M.D.    Anti-infectives: Anti-infectives   Start     Dose/Rate Route Frequency Ordered Stop   11/10/12 1000  piperacillin-tazobactam (ZOSYN) IVPB 3.375 g     3.375 g 12.5 mL/hr over 240 Minutes Intravenous 3 times per day 11/10/12 2956        Assessment/Plan: s/p Procedure(s): EXPLORATORY LAPAROTOMY (N/A) SMALL BOWEL RESECTION (N/A) APPLICATION OF WOUND VAC (N/A) PLACEMENT OF FEEDING JEJUNOSTOMY TUBE (N/A) LYSIS OF ADHESION (N/A) Vent management and weaning per CCM - no plans to return to OR at this time Restart TNA - will need to wait a few days before feeding via jejunostomy tube, since it is proximal to new anastomosis Anticoagulation - will need therapeutic anticoagulation due to recent bilateral PE's Will change VAC tomorrow Remains critically ill - appreciate CCM assistance   LOS: 3 days    Cynthia Bowen K. 11/12/2012

## 2012-11-12 NOTE — Progress Notes (Signed)
PULMONARY  / CRITICAL CARE MEDICINE  Name: Cynthia Bowen MRN: [id number] DOB: August 03, 1945    ADMISSION DATE:  11/09/2012 CONSULTATION DATE:  11/11/2012  REFERRING MD :  CCS PRIMARY SERVICE: CCS  CHIEF COMPLAINT:  VDRF  BRIEF PATIENT DESCRIPTION: 67 year old female with PMH of COPD and multiple abdominal surgeries who presents to the ICU post abdominal surgery due to 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  Patient has a wound vac in place and was transferred to the ICU post op for post surgical care.  PCCM was consulted for vent and medical management.  SIGNIFICANT EVENTS / STUDIES:  7/22 surgical repair as above.  LINES / TUBES: ET tube 7/22>>> PIV Foley 7/22>>>  CULTURES: Blood 7/22>>> Urine 7/22>>> Sputum 7/22>>>  ANTIBIOTICS: Zosyn 7/22>>>  SUBJECTIVE: Awake and interactive on propofol and fentanyl drips.  VITAL SIGNS: Temp:  [97.6 F (36.4 C)-99.6 F (37.6 C)] 97.6 F (36.4 C) (07/23 0755) Pulse Rate:  [72-131] 124 (07/23 0905) Resp:  [14-25] 19 (07/23 0905) BP: (91-155)/(54-88) 106/66 mmHg (07/23 0905) SpO2:  [100 %] 100 % (07/23 0905) FiO2 (%):  [40 %-100 %] 40 % (07/23 0800) Weight:  [46.4 kg (102 lb 4.7 oz)] 46.4 kg (102 lb 4.7 oz) (07/23 0500) HEMODYNAMICS:   VENTILATOR SETTINGS: Vent Mode:  [-] PSV FiO2 (%):  [40 %-100 %] 40 % Set Rate:  [14 bmp] 14 bmp Vt Set:  [460 mL] 460 mL PEEP:  [5 cmH20] 5 cmH20 Pressure Support:  [5 cmH20] 5 cmH20 Plateau Pressure:  [15 cmH20-16 cmH20] 16 cmH20 INTAKE / OUTPUT: Intake/Output     07/22 0701 - 07/23 0700 07/23 0701 - 07/24 0700   I.V. (mL/kg) 3557.3 (76.7) 12.6 (0.3)   Blood 350    NG/GT 30    IV Piggyback 250    TPN 1717.5    Total Intake(mL/kg) 5904.8 (127.3) 12.6 (0.3)   Urine (mL/kg/hr) 1642 (1.5) 40 (0.3)   Emesis/NG output 350 (0.3)    Drains 185 (0.2)    Blood 300 (0.3)    Total Output 2477 40   Net +3427.8 -27.4         PHYSICAL  EXAMINATION: General: Chronically ill appearing female.  Arousable and interactive. Neuro: Sedated and intubated, withdraw all ext to command. HEENT: [place]/AT, PERRL, EOM-I and MMM. Cardiovascular:  RRR, Nl S1/S2, -M/R/G. Lungs: Bibasilar crackles. Abdomen:  Soft, diffusely tender, dressing in place, neg BS. Musculoskeletal:  -edema and -tenderness. Skin:  Skin incision noted, clean, wound vac in place, otherwise intact.  LABS:  Recent Labs Lab 11/09/12 1545 11/10/12 0635 11/11/12 0530 11/11/12 1751 11/11/12 1800 11/12/12 0248 11/12/12 0500  HGB 10.9* 9.9* 8.4*  --  13.1 9.1*  --   WBC  --  34.3* 39.3*  --  47.7* 36.0*  --   PLT  --  489* 417*  --  384 324  --   NA 138 129* 130*  --  131* 131*  --   K 3.7 4.9 3.8  --  4.6 3.9  --   CL 104 95* 99  --  97 103  --   CO2  --  21 20  --  21 21  --   GLUCOSE 163* 159* 180*  --  102* 201*  --   BUN 17 16 20   --  17 21  --   CREATININE 0.40* 0.60 0.56  --  0.62 0.53  --   CALCIUM  --  9.5 8.9  --  9.3 8.2*  --   MG  --  1.8  --   --   --  1.7  --   PHOS  --  3.2  --   --   --  2.2*  --   AST  --  14 12  --  34  --   --   ALT  --  8 6  --  15  --   --   ALKPHOS  --  100 78  --  96  --   --   BILITOT  --  0.3 0.2*  --  0.5  --   --   PROT  --  8.4* 6.9  --  7.4  --   --   ALBUMIN  --  2.6* 2.0*  --  2.1*  --   --   LATICACIDVEN 3.03* 2.7* 2.5*  --   --   --   --   PHART  --   --   --  7.389  --   --  7.380  PCO2ART  --   --   --  36.2  --   --  37.8  PO2ART  --   --   --  121.0*  --   --  97.6    Recent Labs Lab 11/11/12 1529 11/11/12 2023 11/11/12 2159 11/11/12 2334 11/12/12 0516  GLUCAP 113* 223* 154* 166* 99    CXR: Pending  ASSESSMENT / PLAN:  PULMONARY A: VDRF post op, COPD history. P:   - Extubate. - Titrate O2 for sats. - IS and flutter valve. - Early mobilization. - IS and flutter valve.  CARDIOVASCULAR A: HTN and sinus tach. P:  - Continue low dose beta blockers with holding parameters. - KVO IVF  now that is on TPN. - Monitor.  RENAL A:  Hyponatremia. P:   - KVO IVF. - F/U BMET. - Replace Mg and Phos.  GASTROINTESTINAL A:  Post-op. P:   - NPO for now. - Continue TPN. - Will need a triple lumen PICC since not sure how much will need TPN.  HEMATOLOGIC A:  Leukocytosis, likely a stress response. P:  - F/U CBC. - Transfuse if Hg<7.  INFECTIOUS A:  No active infection for now. P:   - Pan culture. - Continue Zosyn for now given WBC. - Will f/u.  ENDOCRINE A:  DM by history   P:   - SSI. - CBGs.  NEUROLOGIC A:  Depression, sedated post op. P:   - D/C propofol and fentanyl drip. - PRN fentanyl for pain, patient is not narcotic naive.  TODAY'S SUMMARY: 67 year old female postop for abdominal surgery, ready to extubate since wound care will be done bedside, attention to narc use to avoid reintubation.  I have personally obtained a history, examined the patient, evaluated laboratory and imaging results, formulated the assessment and plan and placed orders.  CRITICAL CARE: The patient is critically ill with multiple organ systems failure and requires high complexity decision making for assessment and support, frequent evaluation and titration of therapies, application of advanced monitoring technologies and extensive interpretation of multiple databases. Critical Care Time devoted to patient care services described in this note is 35 minutes.   Alyson Reedy, M.D. Pulmonary and Critical Care Medicine [facility] Pager: [phone]  11/12/2012, 9:38 AM

## 2012-11-12 NOTE — Procedures (Signed)
Extubation Procedure Note  Patient Details:   Name: Cynthia Bowen DOB: 08-14-1945 MRN: [id number]   Airway Documentation:     Evaluation  O2 sats: stable throughout Complications: No apparent complications Patient did tolerate procedure well. Bilateral Breath Sounds: Clear;Diminished   Yes  Order to extubate, pt positive for cuff leak, extubated to 3lpm [place]. No stridor or dyspnea noted after extubation. Pt achieved around x2 with IS. Pt is resting more comfortably and all vitals are within normal limits. RT Will continue to monitor.   [place], [place] 11/12/2012, 9:10 AM

## 2012-11-12 NOTE — Progress Notes (Signed)
PARENTERAL NUTRITION CONSULT NOTE - FOLLOW UP  Pharmacy Consult:  TPN Indication:  Gastrocutaneous fistula  Allergies  Allergen Reactions  . Avelox (Moxifloxacin Hcl In Nacl) Nausea And Vomiting  . Betadine (Povidone Iodine) Itching and Rash  . Alendronate Sodium Nausea And Vomiting  . Aspirin Nausea Only  . Codeine Nausea And Vomiting  . Doxycycline     Pt doesn't remember reaction  . Fluconazole     Pt doesn't remember reaction  . Hydrocodone Nausea And Vomiting    GI distress  . Neurontin (Gabapentin) Other (See Comments)    Mood changes   . Nsaids Other (See Comments)    Severe gastritis & perforation - avoid NSAIDs when possible  . Sertraline Hcl Other (See Comments)    Hallucinations   . Sulfa Antibiotics Rash    Patient Measurements: Height: 5' 4.96" (165 cm) Weight: 102 lb 4.7 oz (46.4 kg) IBW/kg (Calculated) : 56.91  Vital Signs: Temp: 97.6 F (36.4 C) (07/23 0755) Temp src: Oral (07/23 0755) BP: 114/62 mmHg (07/23 0800) Pulse Rate: 123 (07/23 0800) Intake/Output from previous day: 07/22 0701 - 07/23 0700 In: 5904.8 [I.V.:3557.3; Blood:350; NG/GT:30; IV Piggyback:250; TPN:1717.5] Out: 2477 [Urine:1642; Emesis/NG output:350; Drains:185; Blood:300]  Labs:  Recent Labs  11/11/12 0530 11/11/12 1800 11/12/12 0248  WBC 39.3* 47.7* 36.0*  HGB 8.4* 13.1 9.1*  HCT 25.2* 37.0 27.8*  PLT 417* 384 324     Recent Labs  11/10/12 0635 11/11/12 0530 11/11/12 1800 11/12/12 0248  NA 129* 130* 131* 131*  K 4.9 3.8 4.6 3.9  CL 95* 99 97 103  CO2 21 20 21 21   GLUCOSE 159* 180* 102* 201*  BUN 16 20 17 21   CREATININE 0.60 0.56 0.62 0.53  CALCIUM 9.5 8.9 9.3 8.2*  MG 1.8  --   --  1.7  PHOS 3.2  --   --  2.2*  PROT 8.4* 6.9 7.4  --   ALBUMIN 2.6* 2.0* 2.1*  --   AST 14 12 34  --   ALT 8 6 15   --   ALKPHOS 100 78 96  --   BILITOT 0.3 0.2* 0.5  --   PREALBUMIN 13.5*  --   --   --   TRIG 88  --   --   --    Estimated Creatinine Clearance: 51.4 ml/min  (by C-G formula based on Cr of 0.53).    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  Now, s/p exp lap 7/22 with LOA, SBR, wound vac and J-tube placement.  Tolerating TNA at goal.  Plan is for TNA for a few days until J-tube is ready to be used.  GI: hx GERD.  Chronic TPN for gastrocutaneous fistula - LFTs WNL, on PPI IV.  Prealbumin low at 13.5.  Endo: no hx - CBGs trending up (156-180) Lytes: low Na, TPN may worsen hyponatremia; K ok at 3.9 -- goal >4, phos low at 2.2, mag low at 1.7 -goal >2 Renal: SCr 0.53 (stable), CrCL ~50 ml/min, D51/2 with KCL at 125 ml/hr, excellent UOP Pulm: hx COPD - stable on RA Cards: no hx - BP improving, worsening tachycardia, TG 88 Hepatobil: LFTs WNL Neuro: hx depression /  neuromuscular disorder / fibromyalgia - Benadryl, fentanyl patch, Prozac AC: Xarelto for hx PE, dose appropriate-- transitioned to lovenox for now, hgb low but stable, thrombocytopenia improving, no bleeding ID: Zosyn D#3 (7/21 >> ), empiric for leukocytosis + possible gastroenteritis, afebrile, WBC trended up to peak 47.7-- down to 36 today, LA 3 >> 2.7, UA unimpressive Best Practices: full dose lovenox, home meds addressed TPN Access:  PICC line TPN day#: continued from home   Current Nutrition:  Home cyclic TPN: 78 gm protein/d, 312 gm dextrose/d, 50 gm lipids MWF.  Total kCal/d based on weekly average = 1659.  MVI MWF, Addamel 2.75mL daily, no insulin 12 hr cycle  Nutritional Goals:  1650 - 1700 kCal, 75-85 grams of protein per day  Plan:  - Cycle Clinimix E 5/20 over 12 hours: 50 ml/hr x 1 hr, 158 ml/hr x 10 hrs, 50 ml/hr x 1 hr.  Clinimix and lipids MWF will provide a weekly average of  1684 kCal/d and 84gm of protein per day. - Lipids 20 ml/hr x 12 hrs on MWF - Daily multivitamin and trace elements - Zinc and Vit C daily - Continue SSI - Replete Phos with 10 mmol Na phos; Mag with 2g magnesium IV - F/U transition to TF when J-tube ready  Cynthia Bowen L. Illene Bolus, PharmD, BCPS Clinical Pharmacist Pager: [phone] Pharmacy: [phone] 11/12/2012 9:03 AM

## 2012-11-12 NOTE — Progress Notes (Signed)
NUTRITION FOLLOW UP  DOCUMENTATION CODES  Per approved criteria   -Severe malnutrition in the context of chronic illness  -Underweight    Intervention:   TPN per pharmacy. Once EN warranted, recommend trickle feedings of Vital AF 1.2 at 10 ml/hr via j-tube. Please consult RD for further TF advancement recommendations, once ready per team. 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 Ongoing.  Goal:   TPN to meet > 90% of estimated nutrition needs  Monitor:   TPN prescription, weight, labs, I/O's, ability to transition to EN  Assessment:   Patient with hx of chronic gastrocutaneous fistula and malnutrition; on TPN at home; presented to ED with lower abdominal pain; abdominal X-ray concerning for partial small bowel obstruction.   Underwent the following procedures on 7/22: EXPLORATORY LAPAROTOMY  SMALL BOWEL RESECTION APPLICATION OF WOUND VAC PLACEMENT OF FEEDING JEJUNOSTOMY TUBE LYSIS OF ADHESION  Pt was just extubated this morning. Per surgeon's note, plan to wait a few days before feeding via j-tube 2/2 jejunostomy is proximal to the new SB anastomosis.  Patient is receiving cyclic TPN with Clinimix E 5/20 x 12 hours: 50 ml/hr x 1 hr, 158 ml/hr x 10 hrs, 50 ml/hr x 1 hr. Lipids (20% IVFE @ 20 ml/hr x 12 hours) to be provided 3 times weekly (MWF). Provides 1684 kcal and 84 grams protein daily (based on weekly average). Meets 100% minimum estimated kcal and 100% minimum estimated protein needs.  Most recent Triglycerides WNL. Prealbumin is low at 13.5. Potassium and magnesium WNL. Phosphorus is low at 2.2, receiving Na Phos per PharmD.  Patient meets criteria for severe malnutrition in the context of chronic illness given 22% weight loss in 4 months and severe muscle loss (temples, clavicles, shoulders) and severe subcutaneous fat loss (biceps/triceps).   Height: Ht  Readings from Last 1 Encounters:  11/10/12 5' 4.96" (1.65 m)    Weight Status:   Wt Readings from Last 1 Encounters:  11/12/12 102 lb 4.7 oz (46.4 kg)  Admit wt 95 lb; wt increasing likely 2/2 net fluid balance of +6.7 liters.  Re-estimated needs:  Kcal: 1500 - 1700 Protein: 80 - 95 grams Fluid: at least 1.5 liters daily  Skin:  Bilateral abdominal incision Wound VAC to abdominal wound  Diet Order: NPO   Intake/Output Summary (Last 24 hours) at 11/12/12 1006 Last data filed at 11/12/12 0800  Gross per 24 hour  Intake 5917.39 ml  Output   2417 ml  Net 3500.39 ml    Last BM: 7/20   Labs:   Recent Labs Lab 11/10/12 0635 11/11/12 0530 11/11/12 1800 11/12/12 0248  NA 129* 130* 131* 131*  K 4.9 3.8 4.6 3.9  CL 95* 99 97 103  CO2 21 20 21 21   BUN 16 20 17 21   CREATININE 0.60 0.56 0.62 0.53  CALCIUM 9.5 8.9 9.3 8.2*  MG 1.8  --   --  1.7  PHOS 3.2  --   --  2.2*  GLUCOSE 159* 180* 102* 201*    CBG (last 3)   Recent Labs  11/11/12 2159 11/11/12 2334 11/12/12 0516  GLUCAP 154* 166* 99   Triglycerides  Date/Time Value Range Status  11/10/2012  6:35 AM 88  <150 mg/dL Final   Prealbumin  Date/Time Value Range Status  11/10/2012  6:35 AM 13.5* 17.0 - 34.0 mg/dL Final  4/54/0981  1:91 AM 11.7* 17.0 -  34.0 mg/dL Final  1/61/0960  4:54 AM 7.7* 17.0 - 34.0 mg/dL Final     Scheduled Meds: . enoxaparin (LOVENOX) injection  45 mg Subcutaneous Q12H  . insulin aspart  0-15 Units Subcutaneous Q4H  . magnesium sulfate 1 - 4 g bolus IVPB  2 g Intravenous Once  . pantoprazole (PROTONIX) IV  40 mg Intravenous QHS  . piperacillin-tazobactam (ZOSYN)  IV  3.375 g Intravenous Q8H  . sodium chloride  10-40 mL Intracatheter Q12H  . sodium glycerophosphate 0.9% NaCl IVPB  10 mmol Intravenous Once    Continuous Infusions: . Marland KitchenTPN (CLINIMIX-E) Adult     And  . fat emulsion      Jarold Motto MS, RD, LDN Pager: [phone] After-hours pager: [phone]

## 2012-11-12 NOTE — Progress Notes (Addendum)
ANTICOAGULATION CONSULT NOTE - Initial Consult  Pharmacy Consult for lovenox  Indication: pulmonary embolus  Allergies  Allergen Reactions  . Avelox (Moxifloxacin Hcl In Nacl) Nausea And Vomiting  . Betadine (Povidone Iodine) Itching and Rash  . Alendronate Sodium Nausea And Vomiting  . Aspirin Nausea Only  . Codeine Nausea And Vomiting  . Doxycycline     Pt doesn't remember reaction  . Fluconazole     Pt doesn't remember reaction  . Hydrocodone Nausea And Vomiting    GI distress  . Neurontin (Gabapentin) Other (See Comments)    Mood changes   . Nsaids Other (See Comments)    Severe gastritis & perforation - avoid NSAIDs when possible  . Sertraline Hcl Other (See Comments)    Hallucinations   . Sulfa Antibiotics Rash    Patient Measurements: Height: 5' 4.96" (165 cm) Weight: 102 lb 4.7 oz (46.4 kg) IBW/kg (Calculated) : 56.91  Vital Signs: Temp: 97.6 F (36.4 C) (07/23 0755) Temp src: Oral (07/23 0755) BP: 114/62 mmHg (07/23 0800) Pulse Rate: 123 (07/23 0800)  Labs:  Recent Labs  11/11/12 0530 11/11/12 1800 11/12/12 0248  HGB 8.4* 13.1 9.1*  HCT 25.2* 37.0 27.8*  PLT 417* 384 324  CREATININE 0.56 0.62 0.53    Estimated Creatinine Clearance: 51.4 ml/min (by C-G formula based on Cr of 0.53).   Medical History: Past Medical History  Diagnosis Date  . COPD (chronic obstructive pulmonary disease)   . Pneumonia 12-2011  . GERD (gastroesophageal reflux disease)   . Headache(784.0)   . Arthritis     osteoarthritis  . Allergy   . Depression   . Neuromuscular disorder   . Osteoporosis   . Bronchitis     CURRENTLY AS OF 06/30/12 - HAS COUGH AND FINISHED ANTIBIOTIC FOR BRONCHITIS  . Fibromyalgia   . Pain     ABDOMINAL PAIN AND NAUSEA  . Pain     SOMETIMES PAIN RIGHT EAR AND NECK--STATES CAUSED BY A "LUMP" ON BACK OF EAR--USES KENALOG CREAM TOPICALLY AS NEEDED.  Marland Kitchen Gastrocutaneous fistula     Medications:  Scheduled:  . insulin aspart  0-15 Units  Subcutaneous Q4H  . pantoprazole (PROTONIX) IV  40 mg Intravenous QHS  . piperacillin-tazobactam (ZOSYN)  IV  3.375 g Intravenous Q8H  . sodium chloride  10-40 mL Intracatheter Q12H    Assessment: 67 yo female presented with abdominal pain and found to have peritonitis, now on TPN. Patient has recent history of bilateral PE's, on xarelto at home (last dose 7/21). She is to be started on lovenox. SCr stable at 0.53, CrCl 51 ml/min. Hgb 9.1 s/p 2 units prbc 7/22  Goal of Therapy:  Anti-Xa level 0.6-1 units/ml CBC every 72 hours Monitor platelets by anticoagulation protocol: Yes   Plan:  Lovenox 45 mg sq Q12h CBC q72 hours  Aaren Atallah B. Artelia Laroche, PharmD Clinical Pharmacist - Resident Pager: [phone] Phone: [phone] 11/12/2012 8:25 AM

## 2012-11-13 DIAGNOSIS — D62 Acute posthemorrhagic anemia: Secondary | ICD-10-CM

## 2012-11-13 DIAGNOSIS — R109 Unspecified abdominal pain: Secondary | ICD-10-CM

## 2012-11-13 DIAGNOSIS — E876 Hypokalemia: Secondary | ICD-10-CM

## 2012-11-13 DIAGNOSIS — R000 Tachycardia, unspecified: Secondary | ICD-10-CM

## 2012-11-13 DIAGNOSIS — E46 Unspecified protein-calorie malnutrition: Secondary | ICD-10-CM

## 2012-11-13 LAB — GLUCOSE, CAPILLARY: Glucose-Capillary: 78 mg/dL (ref 70–99)

## 2012-11-13 LAB — CBC
Hemoglobin: 8 g/dL — ABNORMAL LOW (ref 12.0–15.0)
MCV: 73.4 fL — ABNORMAL LOW (ref 78.0–100.0)
Platelets: 346 10*3/uL (ref 150–400)
RBC: 3.2 MIL/uL — ABNORMAL LOW (ref 3.87–5.11)
WBC: 31.8 10*3/uL — ABNORMAL HIGH (ref 4.0–10.5)

## 2012-11-13 LAB — COMPREHENSIVE METABOLIC PANEL
ALT: 12 U/L (ref 0–35)
AST: 20 U/L (ref 0–37)
Albumin: 1.4 g/dL — ABNORMAL LOW (ref 3.5–5.2)
Calcium: 8.3 mg/dL — ABNORMAL LOW (ref 8.4–10.5)
GFR calc Af Amer: >90 mL/min (ref >90)
Sodium: 138 mEq/L (ref 135–145)
Total Protein: 5.6 g/dL — ABNORMAL LOW (ref 6.0–8.3)

## 2012-11-13 MED ORDER — POTASSIUM CHLORIDE 10 MEQ/50ML IV SOLN
10.0000 meq | INTRAVENOUS | Status: AC
  Administered 2012-11-13 (×2): 10 meq via INTRAVENOUS
  Filled 2012-11-13: qty 100

## 2012-11-13 MED ORDER — POTASSIUM PHOSPHATE DIBASIC 3 MMOLE/ML IV SOLN
10.0000 mmol | Freq: Once | INTRAVENOUS | Status: AC
  Administered 2012-11-13: 10 mmol via INTRAVENOUS
  Filled 2012-11-13: qty 3.33

## 2012-11-13 MED ORDER — INSULIN ASPART 100 UNIT/ML ~~LOC~~ SOLN
0.0000 [IU] | SUBCUTANEOUS | Status: DC
  Administered 2012-11-13: 3 [IU] via SUBCUTANEOUS
  Administered 2012-11-14: 2 [IU] via SUBCUTANEOUS
  Administered 2012-11-14: 3 [IU] via SUBCUTANEOUS
  Administered 2012-11-14 – 2012-11-17 (×8): 2 [IU] via SUBCUTANEOUS
  Administered 2012-11-17: 3 [IU] via SUBCUTANEOUS

## 2012-11-13 MED ORDER — FENTANYL 25 MCG/HR TD PT72
75.0000 ug | MEDICATED_PATCH | TRANSDERMAL | Status: DC
  Administered 2012-11-13 – 2012-11-22 (×4): 75 ug via TRANSDERMAL
  Filled 2012-11-13 (×3): qty 3
  Filled 2012-11-13: qty 1
  Filled 2012-11-13 (×2): qty 3

## 2012-11-13 MED ORDER — ZINC TRACE METAL 1 MG/ML IV SOLN
INTRAVENOUS | Status: AC
  Administered 2012-11-13: 18:00:00 via INTRAVENOUS
  Filled 2012-11-13: qty 2000

## 2012-11-13 MED FILL — Chlorhexidine Gluconate Liquid 4%: CUTANEOUS | Qty: 30 | Status: AC

## 2012-11-13 NOTE — Progress Notes (Signed)
NUTRITION FOLLOW UP  DOCUMENTATION CODES  Per approved criteria   -Severe malnutrition in the context of chronic illness  -Underweight    Intervention:   TPN per pharmacy. Once EN warranted, recommend trickle feedings of Vital AF 1.2 at 10 ml/hr via j-tube. Please consult RD for further TF advancement recommendations, once ready per team. RD to follow for nutrition care plan.  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 Ongoing.  Goal:   TPN to meet > 90% of estimated nutrition needs - met  Monitor:   TPN prescription, weight, labs, I/O's, ability to transition to EN  Assessment:   Patient with hx of chronic gastrocutaneous fistula and malnutrition; on TPN at home; presented to ED with lower abdominal pain; abdominal X-ray concerning for partial small bowel obstruction.   Underwent the following procedures on 7/22: EXPLORATORY LAPAROTOMY  SMALL BOWEL RESECTION APPLICATION OF WOUND VAC PLACEMENT OF FEEDING JEJUNOSTOMY TUBE LYSIS OF ADHESION  Pt extubated 7/23. Per surgeon's note, plan to wait a few days before feeding via j-tube 2/2 jejunostomy is proximal to the new SB anastomosis.  Patient is receiving cyclic TPN with Clinimix E 5/20 x 12 hours: 50 ml/hr x 1 hr, 158 ml/hr x 10 hrs, 50 ml/hr x 1 hr. Lipids (20% IVFE @ 20 ml/hr x 12 hours) to be provided 3 times weekly (MWF). Provides 1684 kcal and 84 grams protein daily (based on weekly average). Meets 100% minimum estimated kcal and 100% minimum estimated protein needs.  Most recent Triglycerides WNL. Prealbumin is low at 13.5. Magnesium WNL. Phosphorus is low at 2.1, Potassium is low at 3.3, receiving repletion K-Phos per PharmD.  Patient meets criteria for severe malnutrition in the context of chronic illness given 22% weight loss in 4 months and severe muscle loss (temples, clavicles, shoulders) and severe subcutaneous fat loss  (biceps/triceps).   Height: Ht Readings from Last 1 Encounters:  11/10/12 5' 4.96" (1.65 m)    Weight Status:   Wt Readings from Last 1 Encounters:  11/12/12 102 lb 4.7 oz (46.4 kg)  Admit wt 95 lb; wt increasing likely 2/2 net fluid balance of +6.9 liters.  Re-estimated needs:  Kcal: 1500 - 1700 Protein: 80 - 95 grams Fluid: at least 1.5 liters daily  Skin:  Bilateral abdominal incision Wound VAC to abdominal wound  Diet Order: NPO   Intake/Output Summary (Last 24 hours) at 11/13/12 1013 Last data filed at 11/13/12 0920  Gross per 24 hour  Intake   2715 ml  Output   2230 ml  Net    485 ml    Last BM: 7/20   Labs:   Recent Labs Lab 11/10/12 0635  11/11/12 1800 11/12/12 0248 11/13/12 0400  NA 129*  < > 131* 131* 138  K 4.9  < > 4.6 3.9 3.3*  CL 95*  < > 97 103 104  CO2 21  < > 21 21 27   BUN 16  < > 17 21 19   CREATININE 0.60  < > 0.62 0.53 0.50  CALCIUM 9.5  < > 9.3 8.2* 8.3*  MG 1.8  --   --  1.7 2.2  PHOS 3.2  --   --  2.2* 2.1*  GLUCOSE 159*  < > 102* 201* 187*  < > = values in this interval not displayed.  CBG (last 3)   Recent Labs  11/12/12 2337 11/13/12 0432 11/13/12 0800  GLUCAP 164* 188* 97  Triglycerides  Date/Time Value Range Status  11/10/2012  6:35 AM 88  <150 mg/dL Final   Prealbumin  Date/Time Value Range Status  11/10/2012  6:35 AM 13.5* 17.0 - 34.0 mg/dL Final  1/61/0960  4:54 AM 11.7* 17.0 - 34.0 mg/dL Final  0/98/1191  4:78 AM 7.7* 17.0 - 34.0 mg/dL Final     Scheduled Meds: . enoxaparin (LOVENOX) injection  45 mg Subcutaneous Q12H  . fentaNYL  75 mcg Transdermal Q72H  . insulin aspart  0-15 Units Subcutaneous Custom  . pantoprazole (PROTONIX) IV  40 mg Intravenous QHS  . piperacillin-tazobactam (ZOSYN)  IV  3.375 g Intravenous Q8H  . potassium phosphate IVPB (mmol)  10 mmol Intravenous Once  . sodium chloride  10-40 mL Intracatheter Q12H    Continuous Infusions: . Marland KitchenTPN (CLINIMIX-E) Adult      Jarold Motto  MS, RD, LDN Pager: [phone] After-hours pager: [phone]

## 2012-11-13 NOTE — Progress Notes (Addendum)
2 Days Post-Op  Subjective: Patient extubated, awake, alert Complaining of pain - better with higher dose Fentanyl patch No flatus yet  Objective: Vital signs in last 24 hours: Temp:  [97.4 F (36.3 C)-99.4 F (37.4 C)] 97.4 F (36.3 C) (07/24 1157) Pulse Rate:  [106-126] 111 (07/24 1200) Resp:  [16-31] 21 (07/24 1200) BP: (106-147)/(48-74) 121/60 mmHg (07/24 1200) SpO2:  [95 %-100 %] 97 % (07/24 1200) Last BM Date: 11/09/12  Intake/Output from previous day: 07/23 0701 - 07/24 0700 In: 2643.6 [I.V.:12.6; NG/GT:60; IV Piggyback:473; TPN:2098] Out: 2315 [Urine:1945; Emesis/NG output:250; Drains:120] Intake/Output this shift: Total I/O In: 240 [I.V.:30; IV Piggyback:210] Out: 340 [Urine:310; Drains:30]  General appearance: alert, cooperative and no distress Resp: clear to auscultation bilaterally Cardio: regular rate and rhythm, S1, S2 normal, no murmur, click, rub or gallop GI: quiet, mildly distended; incisional tenderness Drain - dark bilious output Wound VAC removed - clean; no bilious drainage; VAC replaced  Lab Results:   Recent Labs  11/12/12 0248 11/13/12 0400  WBC 36.0* 31.8*  HGB 9.1* 8.0*  HCT 27.8* 23.5*  PLT 324 346   BMET  Recent Labs  11/12/12 0248 11/13/12 0400  NA 131* 138  K 3.9 3.3*  CL 103 104  CO2 21 27  GLUCOSE 201* 187*  BUN 21 19  CREATININE 0.53 0.50  CALCIUM 8.2* 8.3*   PT/INR No results found for this basename: LABPROT, INR,  in the last 72 hours ABG  Recent Labs  11/11/12 1751 11/12/12 0500  PHART 7.389 7.380  HCO3 21.4 21.8    Studies/Results: Ir Fluoro Guide Cv Line Right  11/12/2012   *RADIOLOGY REPORT*  Clinical history:67 year old with recent small bowel obstruction and postop.  The patient needs TPN.  The patient currently has a single lumen right arm PICC line and needs a triple lumen PICC line.  PROCEDURE(S): EXCHANGE OF PICC LINE WITH FLUOROSCOPY  Physician: Rachelle Hora. Henn, MD  Fluoroscopy time: 1 minute and 54  seconds  Procedure:The procedure was explained to the patient.  The risks and benefits of the procedure were discussed and the patient's questions were addressed.  Informed consent was obtained from the patient.  The right arm was prepped and draped in a sterile fashion.  Maximal barrier sterile technique was utilized including caps, mask, sterile gowns, sterile gloves, sterile drape, hand hygiene and skin antiseptic.  The PICC line was removed over a 0.018 wire.  A new triple lumen catheter was cut to 37 cm and easily advanced over a wire.  The tip of the catheter was placed in the lower SVC.  Unfortunately, the lumens would not aspirate. Contrast injection demonstrated a patent SVC but there is probably a fibrin sheath.  As a result, this catheter removed over the wire and exchanged for a 39 cm triple lumen PICC line.  This catheter was placed near the SVC and right atrium junction.  The middle lumen would not aspirate but the other two lumens did aspirate well.  All of the lumens flushed well.  Catheter was sutured to the skin.  Findings:Fibrin sheath in the lower SVC.  The new catheter tip is near the junction of the SVC and right atrium.  Complications: None  Impression:Successful exchange of the right arm PICC line.  Fibrin sheath in the lower SVC.   Original Report Authenticated By: Richarda Overlie, M.D.   Dg Chest Port 1 View  11/12/2012   *RADIOLOGY REPORT*  Clinical Data: Evaluate endotracheal tube position.  PORTABLE CHEST - 1  VIEW  Comparison: 11/11/2012  Findings: The endotracheal tube tip lies 3.8 cm above carina, with the difference from the previous day's study due to differences in patient positioning.  The nasogastric tube passes below the diaphragm into the stomach and the right PICC has its tip in the lower superior vena cava.  The heart, mediastinum and hila are unremarkable.  There is persistent coarse reticular left lung base opacity likely scarring and / or atelectasis.  The lungs otherwise  clear.  No pleural effusion or pneumothorax.  IMPRESSION: Support apparatus is well positioned as described.  Persistent left lung base atelectasis/scarring.  No convincing infiltrate or pulmonary edema.   Original Report Authenticated By: Amie Portland, M.D.   Dg Chest Port 1 View  11/11/2012   *RADIOLOGY REPORT*  Clinical Data: Endotracheal tube placement.  PORTABLE CHEST - 1 VIEW  Comparison: November 09, 2012.  Findings: Right-sided PICC line is unchanged in position. Nasogastric tube is seen passing through esophagus into stomach. Cardiomediastinal silhouette appears normal.  Increased linear opacity is noted laterally in left lung base concerning for subsegmental atelectasis.  Minimal subsegmental atelectasis is noted laterally in right lung base.  Endotracheal tube is now seen projected over tracheal air shadow with distal tip approximately 5 cm above the carina.  Bony thorax is intact.  IMPRESSION: Endotracheal tube in grossly good position.  Increased bilateral basilar subsegmental atelectasis is noted compared to prior exam.   Original Report Authenticated By: Lupita Raider.,  M.D.    Anti-infectives: Anti-infectives   Start     Dose/Rate Route Frequency Ordered Stop   11/10/12 1000  piperacillin-tazobactam (ZOSYN) IVPB 3.375 g     3.375 g 12.5 mL/hr over 240 Minutes Intravenous 3 times per day 11/10/12 1610        Assessment/Plan: s/p Procedure(s): EXPLORATORY LAPAROTOMY (N/A) SMALL BOWEL RESECTION (N/A) APPLICATION OF WOUND VAC (N/A) PLACEMENT OF FEEDING JEJUNOSTOMY TUBE (N/A) LYSIS OF ADHESION (N/A) Continue foley due to strict I&O Ice chips VAC change T, Th, Sat Continue abx until WBC normalizes Wait on using feeding tube until we are sure that SB anastomosis is OK Continue cyclic TNA for now. Continue ICU today.  Critical care 35 minutes   LOS: 4 days  Alick Lecomte K. 11/13/2012

## 2012-11-13 NOTE — Progress Notes (Signed)
[facility] ADULT ICU REPLACEMENT PROTOCOL FOR AM LAB REPLACEMENT ONLY  The patient does apply for the [facility] Adult ICU Electrolyte Replacment Protocol based on the criteria listed below:   1. Is GFR >/= 40 ml/min? yes  Patient's GFR today is >90 2. Is urine output >/= 0.5 ml/kg/hr for the last 6 hours? yes Patient's UOP is 1.6 ml/kg/hr 3. Is BUN < 60 mg/dL? yes  Patient's BUN today is 19 4. Abnormal electrolyte(s): Potassium 5. Ordered repletion with: Potassium per Protocol 6. If a panic level lab has been reported, has the CCM MD in charge been notified? yes.   Physician:  Dr. Lydia Guiles, Zarielle Cea P 11/13/2012 4:42 AM

## 2012-11-13 NOTE — Evaluation (Signed)
Physical Therapy Evaluation Patient Details Name: Cynthia Bowen MRN: [id number] DOB: 31-Jul-1945 Today's Date: 11/13/2012 Time: 1478-2956 PT Time Calculation (min): 15 min  PT Assessment / Plan / Recommendation History of Present Illness  67 year old female with PMH of COPD and multiple abdominal surgeries who presents to the ICU post abdominal surgery due to 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  Patient has a wound vac in place  Clinical Impression  Pleasant pt who is normally independent at home with nephew and limited by abdominal pain with mobility and activity. Pt will benefit from acute therapy to maximize mobility, gait and safety prior to discharge home. Recommend daily mobility with nursing staff.     PT Assessment  Patient needs continued PT services    Follow Up Recommendations  No PT follow up    Does the patient have the potential to tolerate intense rehabilitation      Barriers to Discharge        Equipment Recommendations  None recommended by PT    Recommendations for Other Services     Frequency Min 3X/week    Precautions / Restrictions Precautions Precaution Comments: NGT, would VAC, Jp drain   Pertinent Vitals/Pain 10/10 abdominal pain, RN aware sats 93-95% on RA      Mobility  Bed Mobility Bed Mobility: Rolling Left;Left Sidelying to Sit Rolling Left: 5: Supervision Left Sidelying to Sit: 5: Supervision;HOB flat Details for Bed Mobility Assistance: supervision for lines, cueing for sequence Transfers Transfers: Sit to Stand;Stand to Sit Sit to Stand: 4: Min guard;From bed Stand to Sit: 4: Min guard;To chair/3-in-1 Details for Transfer Assistance: guarding for safety and lines Ambulation/Gait Ambulation/Gait Assistance: 4: Min guard Ambulation Distance (Feet): 90 Feet Assistive device: None Ambulation/Gait Assistance Details: cueing to extend trunk secondary to flexed posture Gait Pattern:  Step-through pattern;Decreased stride length Gait velocity: decreased Stairs: No    Exercises     PT Diagnosis: Acute pain;Difficulty walking  PT Problem List: Decreased activity tolerance;Pain PT Treatment Interventions: Gait training;Functional mobility training;Therapeutic exercise;Patient/family education     PT Goals(Current goals can be found in the care plan section) Acute Rehab PT Goals Patient Stated Goal: return home PT Goal Formulation: With patient Time For Goal Achievement: 11/27/12 Potential to Achieve Goals: Good  Visit Information  Last PT Received On: 11/13/12 Assistance Needed: +1 History of Present Illness: 67 year old female with PMH of COPD and multiple abdominal surgeries who presents to the ICU post abdominal surgery due to 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  Patient has a wound vac in place       Prior Functioning  Home Living Family/patient expects to be discharged to:: Private residence Living Arrangements: Other relatives (nephew) Available Help at Discharge: Family;Available PRN/intermittently Type of Home: Mobile home Home Access: Stairs to enter Entrance Stairs-Number of Steps: 2 Entrance Stairs-Rails: Right Home Layout: One level Home Equipment: Cane - single point Prior Function Level of Independence: Independent Communication Communication: No difficulties    Cognition  Cognition Arousal/Alertness: Awake/alert Behavior During Therapy: WFL for tasks assessed/performed Overall Cognitive Status: Within Functional Limits for tasks assessed    Extremity/Trunk Assessment Upper Extremity Assessment Upper Extremity Assessment: Generalized weakness Lower Extremity Assessment Lower Extremity Assessment: Generalized weakness Cervical / Trunk Assessment Cervical / Trunk Assessment: Kyphotic   Balance    End of Session PT - End of Session Activity Tolerance: Patient limited by  pain Patient left: in chair;with call bell/phone  within reach;with nursing/sitter in room Nurse Communication: Mobility status  GP     Toney Sang [facility] 11/13/2012, 11:51 AM Delaney Meigs, PT [phone]

## 2012-11-13 NOTE — Progress Notes (Signed)
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  Patient has a wound vac in place and was transferred to the ICU post op for post surgical care.  PCCM was consulted for vent and medical management.  SIGNIFICANT EVENTS / STUDIES:  7/22 surgical repair as above.  LINES / TUBES: ET tube 7/22>>> PIV Foley 7/22>>>  CULTURES: Blood 7/22>>> Urine 7/22>>> Sputum 7/22>>>  ANTIBIOTICS: Zosyn 7/22>>>  SUBJECTIVE: Awake and interactive on propofol and fentanyl drips.  VITAL SIGNS: Temp:  [97.6 F (36.4 C)-99.4 F (37.4 C)] 97.6 F (36.4 C) (07/24 0800) Pulse Rate:  [106-126] 106 (07/24 1100) Resp:  [16-31] 16 (07/24 1100) BP: (106-147)/(48-77) 116/65 mmHg (07/24 1100) SpO2:  [96 %-100 %] 100 % (07/24 1100) HEMODYNAMICS:   VENTILATOR SETTINGS:   INTAKE / OUTPUT: Intake/Output     07/23 0701 - 07/24 0700 07/24 0701 - 07/25 0700   I.V. (mL/kg) 12.6 (0.3) 30 (0.6)   Blood     NG/GT 60    IV Piggyback 473 168   TPN 2098    Total Intake(mL/kg) 2643.6 (57) 198 (4.3)   Urine (mL/kg/hr) 1945 (1.7) 310 (1.4)   Emesis/NG output 250 (0.2)    Drains 120 (0.1) 30 (0.1)   Blood     Total Output 2315 340   Net +328.6 -142         PHYSICAL EXAMINATION: General: Chronically ill appearing female.  Arousable and interactive. Neuro: Sedated and intubated, withdraw all ext to command. HEENT: [place]/AT, PERRL, EOM-I and MMM. Cardiovascular:  RRR, Nl S1/S2, -M/R/G. Lungs: Bibasilar crackles. Abdomen:  Soft, diffusely tender, dressing in place, neg BS. Musculoskeletal:  -edema and  -tenderness. Skin:  Skin incision noted, clean, wound vac in place, otherwise intact.  LABS:  Recent Labs Lab 11/09/12 1545 11/10/12 0635 11/11/12 0530 11/11/12 1751 11/11/12 1800 11/12/12 0248 11/12/12 0500 11/13/12 0400  HGB 10.9* 9.9* 8.4*  --  13.1 9.1*  --  8.0*  WBC  --  34.3* 39.3*  --  47.7* 36.0*  --  31.8*  PLT  --  489* 417*  --  384 324  --  346  NA 138 129* 130*  --  131* 131*  --  138  K 3.7 4.9 3.8  --  4.6 3.9  --  3.3*  CL 104 95* 99  --  97 103  --  104  CO2  --  21 20  --  21 21  --  27  GLUCOSE 163* 159* 180*  --  102* 201*  --  187*  BUN 17 16 20   --  17 21  --  19  CREATININE 0.40* 0.60 0.56  --  0.62 0.53  --  0.50  CALCIUM  --  9.5 8.9  --  9.3 8.2*  --  8.3*  MG  --  1.8  --   --   --  1.7  --  2.2  PHOS  --  3.2  --   --   --  2.2*  --  2.1*  AST  --  14 12  --  34  --   --  20  ALT  --  8 6  --  15  --   --  12  ALKPHOS  --  100 78  --  96  --   --  83  BILITOT  --  0.3 0.2*  --  0.5  --   --  0.1*  PROT  --  8.4* 6.9  --  7.4  --   --  5.6*  ALBUMIN  --  2.6* 2.0*  --  2.1*  --   --  1.4*  LATICACIDVEN 3.03* 2.7* 2.5*  --   --   --   --   --   PHART  --   --   --  7.389  --   --  7.380  --   PCO2ART  --   --   --  36.2  --   --  37.8  --   PO2ART  --   --   --  121.0*  --   --  97.6  --     Recent Labs Lab 11/12/12 1539 11/12/12 2014 11/12/12 2337 11/13/12 0432 11/13/12 0800  GLUCAP 102* 163* 164* 188* 97    CXR: Pending  ASSESSMENT / PLAN:  PULMONARY A: VDRF post op, COPD history. P:   - Extubated and doing well. - Titrate O2 for sats. - IS and flutter valve. - Early mobilization when seen fit by surgery. - IS and flutter valve.  CARDIOVASCULAR A: HTN and sinus tach. P:  - Continue low dose beta blockers with holding parameters. - KVO IVF now that is on TPN at night as at home. - Monitor.  RENAL A:  Hyponatremia. P:   - KVO IVF. - F/U BMET. - Replace Mg and Phos as indicated.  GASTROINTESTINAL A:  Post-op. P:    - NPO for now until ok with surgery. - Continue TPN.  HEMATOLOGIC A:  Leukocytosis, likely a stress response. P:  - F/U CBC. - Transfuse if Hg<7.  INFECTIOUS A:  No active infection for now. P:   - Pan culture. - Continue Zosyn for now given WBC. - Will f/u.  ENDOCRINE A:  DM by history   P:   - SSI. - CBGs.  NEUROLOGIC A:  Depression, sedated post op. P:   - PRN fentanyl for pain, patient is not narcotic naive. - Restart fentanyl patch at 75 mcg.  Patient ready for transfer to SDU whenever surgery is ready, PCCM will sign off, please call back if needed.  I have personally obtained a history, examined the patient, evaluated laboratory and imaging results, formulated the assessment and plan and placed orders.  Alyson Reedy, M.D. Pulmonary and Critical Care Medicine [facility] Pager: [phone]  11/13/2012, 11:41 AM

## 2012-11-13 NOTE — Progress Notes (Addendum)
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 Avelox (Moxifloxacin Hcl In Nacl) Nausea And Vomiting  . Betadine (Povidone Iodine) Itching and Rash  . Alendronate Sodium Nausea And Vomiting  . Aspirin Nausea Only  . Codeine Nausea And Vomiting  . Doxycycline     Pt doesn't remember reaction  . Fluconazole     Pt doesn't remember reaction  . Hydrocodone Nausea And Vomiting    GI distress  . Neurontin (Gabapentin) Other (See Comments)    Mood changes   . Nsaids Other (See Comments)    Severe gastritis & perforation - avoid NSAIDs when possible  . Sertraline Hcl Other (See Comments)    Hallucinations   . Sulfa Antibiotics Rash    Patient Measurements: Height: 5' 4.96" (165 cm) Weight: 102 lb 4.7 oz (46.4 kg) IBW/kg (Calculated) : 56.91 Adjusted Body Weight:  Usual Weight:   Vital Signs: Temp: 97.6 F (36.4 C) (07/24 0800) Temp src: Oral (07/24 0800) BP: 118/61 mmHg (07/24 0800) Pulse Rate: 120 (07/24 0800) Intake/Output from previous day: 07/23 0701 - 07/24 0700 In: 2643.6 [I.V.:12.6; NG/GT:60; IV Piggyback:473; TPN:2098] Out: 2315 [Urine:1945; Emesis/NG output:250; Drains:120] Intake/Output from this shift: Total I/O In: 30 [I.V.:30] Out: 80 [Urine:50; Drains:30]  Labs:  Recent Labs  11/11/12 1800 11/12/12 0248 11/13/12 0400  WBC 47.7* 36.0* 31.8*  HGB 13.1 9.1* 8.0*  HCT 37.0 27.8* 23.5*  PLT 384 324 346     Recent Labs  11/11/12 0530 11/11/12 1800 11/12/12 0248 11/13/12 0400  NA 130* 131* 131* 138  K 3.8 4.6 3.9 3.3*  CL 99 97 103 104  CO2 20 21 21 27   GLUCOSE 180* 102* 201* 187*  BUN 20 17 21 19   CREATININE 0.56 0.62 0.53 0.50  CALCIUM 8.9 9.3 8.2* 8.3*  MG  --   --  1.7 2.2  PHOS  --   --  2.2* 2.1*  PROT 6.9 7.4  --  5.6*  ALBUMIN 2.0* 2.1*  --  1.4*  AST 12 34  --  20  ALT 6 15  --  12  ALKPHOS 78 96  --  83  BILITOT  0.2* 0.5  --  0.1*   Estimated Creatinine Clearance: 51.4 ml/min (by C-G formula based on Cr of 0.5).    Recent Labs  11/12/12 2337 11/13/12 0432 11/13/12 0800  GLUCAP 164* 188* 97    Medications:  Scheduled:  . enoxaparin (LOVENOX) injection  45 mg Subcutaneous Q12H  . insulin aspart  0-15 Units Subcutaneous Q4H  . pantoprazole (PROTONIX) IV  40 mg Intravenous QHS  . piperacillin-tazobactam (ZOSYN)  IV  3.375 g Intravenous Q8H  . potassium phosphate IVPB (mmol)  10 mmol Intravenous Once  . 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 Now, s/p exp lap 7/22 with LOA, SBR, wound vac and J-tube placement. Tolerating TNA at goal. Plan is for TNA for a few days until J-tube is ready to be used.  Pt with severe malnutrition in the context of chronic illness.  GI: hx GERD. Chronic TPN for gastrocutaneous fistula - LFTs  WNL, on PPI IV. Prealbumin low at 13.5.   Endo: no hx - CBGs 164, 188 on tpn and 102, 97 off tpn  Lytes: Na 138; K 3.3 -- goal >4 & supplemented this AM, phos low at 2.1 s/p Glycophos x 1, mag 2.2 -goal >2   Renal: SCr 0.5 (stable), CrCL ~50 ml/min, no IVF while off TPN, excellent UOP   Pulm: hx COPD - [place]-2L  Cards: no hx - BP improving, remains tachycardic, TG 88   Hepatobil: LFTs WNL   Neuro: hx depression / neuromuscular disorder / fibromyalgia - Benadryl, fentanyl patch, Prozac   AC: Xarelto for hx PE, dose appropriate-- transitioned to lovenox for now, hgb low but stable, thrombocytopenia improving, no bleeding   ID: Zosyn D#3 (7/21 >> ), empiric for leukocytosis + possible gastroenteritis, afebrile, WBC peaked 47.7, trending down to 31.8 today, LA 3 >> 2.7, UA  unimpressive   Best Practices: full dose lovenox, home meds addressed  TPN Access: PICC line  TPN day#: continued from home   Current Nutrition:  Home cyclic TPN: 78 gm protein/d, 312 gm dextrose/d, 50 gm lipids MWF. Total kCal/d based on weekly average = 1659, meeting 100% goals  MVI MWF, Addamel 2.91mL daily, no insulin  12 hr cycle   Nutritional Goals:  1650 - 1700 kCal, 75-85 grams of protein per day   Plan:  -  Cycle Clinimix E 5/20 over 12 hours: 50 ml/hr x 1 hr, 158 ml/hr x 10 hrs, 50 ml/hr x 1 hr. Clinimix and lipids MWF will provide a weekly average of 1684 kCal/d and 84gm of protein per day.  - Lipids 20 ml/hr x 12 hrs on MWF  - Daily multivitamin and trace elements  - Zinc 5mg  and Vit C 500mg  added to tpn daily  - Continue SSI - change to 1700, 2000, 2400, 0500, 0700 - K-Phos IV x 1   - F/U transition to TF when J-tube ready   Marisue Humble, PharmD Clinical Pharmacist [facility]

## 2012-11-14 ENCOUNTER — Encounter (HOSPITAL_COMMUNITY): Payer: Self-pay | Admitting: *Deleted

## 2012-11-14 ENCOUNTER — Inpatient Hospital Stay (HOSPITAL_COMMUNITY): Payer: Medicare Other

## 2012-11-14 LAB — BASIC METABOLIC PANEL
Calcium: 8.6 mg/dL (ref 8.4–10.5)
Creatinine, Ser: 0.46 mg/dL — ABNORMAL LOW (ref 0.50–1.10)
GFR calc Af Amer: >90 mL/min (ref >90)
GFR calc non Af Amer: >90 mL/min (ref >90)
Sodium: 139 mEq/L (ref 135–145)

## 2012-11-14 LAB — PREPARE RBC (CROSSMATCH)

## 2012-11-14 LAB — MAGNESIUM: Magnesium: 2 mg/dL (ref 1.5–2.5)

## 2012-11-14 LAB — CBC
MCH: 24.6 pg — ABNORMAL LOW (ref 26.0–34.0)
MCV: 73.4 fL — ABNORMAL LOW (ref 78.0–100.0)
Platelets: 384 10*3/uL (ref 150–400)
RBC: 2.93 MIL/uL — ABNORMAL LOW (ref 3.87–5.11)
RDW: 18.1 % — ABNORMAL HIGH (ref 11.5–15.5)

## 2012-11-14 LAB — GLUCOSE, CAPILLARY
Glucose-Capillary: 159 mg/dL — ABNORMAL HIGH (ref 70–99)
Glucose-Capillary: 87 mg/dL (ref 70–99)
Glucose-Capillary: 130 mg/dL — ABNORMAL HIGH (ref 70–99)

## 2012-11-14 LAB — PHOSPHORUS: Phosphorus: 2.7 mg/dL (ref 2.3–4.6)

## 2012-11-14 MED ORDER — ZINC TRACE METAL 1 MG/ML IV SOLN
INTRAVENOUS | Status: AC
  Administered 2012-11-14: 18:00:00 via INTRAVENOUS
  Filled 2012-11-14: qty 2000

## 2012-11-14 MED ORDER — NALOXONE HCL 0.4 MG/ML IJ SOLN: 0.4000 mg | INTRAMUSCULAR | Status: DC | PRN

## 2012-11-14 MED ORDER — DIPHENHYDRAMINE HCL 12.5 MG/5ML PO ELIX
12.5000 mg | ORAL_SOLUTION | Freq: Four times a day (QID) | ORAL | Status: DC | PRN
  Filled 2012-11-14: qty 5

## 2012-11-14 MED ORDER — POTASSIUM CHLORIDE 10 MEQ/50ML IV SOLN
10.0000 meq | INTRAVENOUS | Status: AC
  Administered 2012-11-14 (×2): 10 meq via INTRAVENOUS
  Filled 2012-11-14: qty 100

## 2012-11-14 MED ORDER — HYDROMORPHONE 0.3 MG/ML IV SOLN
INTRAVENOUS | Status: DC
  Administered 2012-11-14: 09:00:00 via INTRAVENOUS
  Administered 2012-11-14: 2.64 mg via INTRAVENOUS
  Administered 2012-11-14: 0.3 mg via INTRAVENOUS
  Administered 2012-11-15: 4.8 mg via INTRAVENOUS
  Administered 2012-11-15: 4.45 mg via INTRAVENOUS
  Administered 2012-11-15: 5.1 mg via INTRAVENOUS
  Administered 2012-11-15: 4.5 mg via INTRAVENOUS
  Administered 2012-11-15: 4.8 mg via INTRAVENOUS
  Administered 2012-11-15: 4.05 mg via INTRAVENOUS
  Administered 2012-11-15: 4.93 mg via INTRAVENOUS
  Administered 2012-11-16: 5.09 mg via INTRAVENOUS
  Administered 2012-11-16: 8.56 mg via INTRAVENOUS
  Administered 2012-11-16: 3.3 mg via INTRAVENOUS
  Administered 2012-11-16: 09:00:00 via INTRAVENOUS
  Administered 2012-11-16: 4.2 mg via INTRAVENOUS
  Administered 2012-11-17: 10:00:00 via INTRAVENOUS
  Administered 2012-11-17 (×2): 3 mg via INTRAVENOUS
  Administered 2012-11-17: via INTRAVENOUS
  Administered 2012-11-17: 0.6 mg via INTRAVENOUS
  Administered 2012-11-17: 4.2 mg via INTRAVENOUS
  Administered 2012-11-17: 3.6 mL via INTRAVENOUS
  Administered 2012-11-18: 3.6 mg via INTRAVENOUS
  Administered 2012-11-18: 3.9 mg via INTRAVENOUS
  Administered 2012-11-18: 23:00:00 via INTRAVENOUS
  Administered 2012-11-18 (×2): 3.6 mg via INTRAVENOUS
  Administered 2012-11-18: 2.7 mg via INTRAVENOUS
  Administered 2012-11-18: 3.53 mg via INTRAVENOUS
  Administered 2012-11-18 – 2012-11-19 (×2): via INTRAVENOUS
  Administered 2012-11-19: 3.7 mg via INTRAVENOUS
  Administered 2012-11-19: 3.15 mg via INTRAVENOUS
  Administered 2012-11-19: 3.3 mg via INTRAVENOUS
  Administered 2012-11-19: 06:00:00 via INTRAVENOUS
  Administered 2012-11-19: 3.6 mg via INTRAVENOUS
  Administered 2012-11-19: 13:00:00 via INTRAVENOUS
  Administered 2012-11-19: 8.4 mg via INTRAVENOUS
  Administered 2012-11-20: 3 mg via INTRAVENOUS
  Administered 2012-11-20: 08:00:00 via INTRAVENOUS
  Administered 2012-11-20: 2.25 mg via INTRAVENOUS
  Administered 2012-11-20: 3.73 mg via INTRAVENOUS
  Administered 2012-11-20: 4.04 mg via INTRAVENOUS
  Administered 2012-11-20: 3.6 mg via INTRAVENOUS
  Administered 2012-11-21: 4.2 mg via INTRAVENOUS
  Administered 2012-11-21: 3.5 mg via INTRAVENOUS
  Administered 2012-11-21 (×2): via INTRAVENOUS
  Administered 2012-11-21: 3.15 mg via INTRAVENOUS
  Administered 2012-11-21: 8.36 mg via INTRAVENOUS
  Administered 2012-11-21 – 2012-11-22 (×2): via INTRAVENOUS
  Administered 2012-11-22: 4.5 mg via INTRAVENOUS
  Administered 2012-11-22: 1.2 mg via INTRAVENOUS
  Administered 2012-11-22: 4.04 mg via INTRAVENOUS
  Administered 2012-11-22: via INTRAVENOUS
  Administered 2012-11-22: 5.1 mg via INTRAVENOUS
  Administered 2012-11-22: 4.8 mg via INTRAVENOUS
  Administered 2012-11-23: 3.6 mg via INTRAVENOUS
  Administered 2012-11-23: 09:00:00 via INTRAVENOUS
  Administered 2012-11-23: 2.4 mg via INTRAVENOUS
  Administered 2012-11-23: 1.8 mg via INTRAVENOUS
  Administered 2012-11-24: 2.4 mg via INTRAVENOUS
  Filled 2012-11-14 (×29): qty 25

## 2012-11-14 MED ORDER — IOHEXOL 300 MG/ML  SOLN
150.0000 mL | Freq: Once | INTRAMUSCULAR | Status: AC | PRN
  Administered 2012-11-14: 180 mL via INTRAVENOUS

## 2012-11-14 MED ORDER — SODIUM CHLORIDE 0.9 % IJ SOLN
9.0000 mL | INTRAMUSCULAR | Status: DC | PRN
  Administered 2012-11-14: 9 mL via INTRAVENOUS

## 2012-11-14 MED ORDER — ONDANSETRON HCL 4 MG/2ML IJ SOLN
4.0000 mg | Freq: Four times a day (QID) | INTRAMUSCULAR | Status: DC | PRN
  Administered 2012-11-16 – 2012-11-22 (×4): 4 mg via INTRAVENOUS
  Filled 2012-11-14 (×4): qty 2

## 2012-11-14 MED ORDER — DIPHENHYDRAMINE HCL 50 MG/ML IJ SOLN
12.5000 mg | Freq: Four times a day (QID) | INTRAMUSCULAR | Status: DC | PRN
  Administered 2012-11-17 – 2012-11-22 (×12): 12.5 mg via INTRAVENOUS
  Filled 2012-11-14 (×12): qty 1

## 2012-11-14 MED ORDER — FAT EMULSION 20 % IV EMUL
250.0000 mL | INTRAVENOUS | Status: AC
  Administered 2012-11-14: 250 mL via INTRAVENOUS
  Filled 2012-11-14: qty 250

## 2012-11-14 MED ORDER — LORAZEPAM 2 MG/ML IJ SOLN
0.5000 mg | Freq: Four times a day (QID) | INTRAMUSCULAR | Status: DC | PRN
  Administered 2012-11-14 – 2012-11-23 (×3): 0.5 mg via INTRAVENOUS
  Filled 2012-11-14 (×3): qty 1

## 2012-11-14 MED ORDER — HYDROMORPHONE 0.3 MG/ML IV SOLN
INTRAVENOUS | Status: AC
  Filled 2012-11-14: qty 25

## 2012-11-14 NOTE — Progress Notes (Signed)
[facility] ADULT ICU REPLACEMENT PROTOCOL FOR AM LAB REPLACEMENT ONLY  The patient does apply for the [facility] Adult ICU Electrolyte Replacment Protocol based on the criteria listed below:   1. Is GFR >/= 40 ml/min? yes  Patient's GFR today is >90 2. Is urine output >/= 0.5 ml/kg/hr for the last 6 hours? yes Patient's UOP is 1.1 ml/kg/hr 3. Is BUN < 60 mg/dL? yes  Patient's BUN today is 19 4. Abnormal electrolyte(s): Potassium 5. Ordered repletion with: Potassium Per Protocol 6. If a panic level lab has been reported, has the CCM MD in charge been notified? no.     Cynthia Bowen P 11/14/2012 5:45 AM

## 2012-11-14 NOTE — Progress Notes (Signed)
3 Days Post-Op  Subjective: Patient still requiring significant hourly doses of Fentanyl in addition to patch - claims pain is mostly at her midline incision Anxious - restless; not sleeping much Eating ice chips NG remains in place No BM or flatus Tachycardic  Objective: Vital signs in last 24 hours: Temp:  [97.4 F (36.3 C)-98.4 F (36.9 C)] 97.8 F (36.6 C) (07/25 0742) Pulse Rate:  [25-118] 109 (07/25 0800) Resp:  [16-30] 18 (07/25 0800) BP: (104-145)/(51-94) 116/52 mmHg (07/25 0800) SpO2:  [91 %-100 %] 96 % (07/25 0800) Last BM Date: 11/09/12  Intake/Output from previous day: 07/24 0701 - 07/25 0700 In: 2242 [I.V.:30; NG/GT:30; IV Piggyback:452; TPN:1730] Out: 1940 [Urine:1685; Drains:255] Intake/Output this shift: Total I/O In: 80 [NG/GT:30; IV Piggyback:50] Out: 525 [Urine:175; Emesis/NG output:350]  General appearance: alert, cooperative and no distress Resp: bilateral basilar crackles; slight pain on right side with deep inspiration Cardio: tachycardic; regular GI: distended; occasional bowel sounds JP drain - greenish output; decreased volume since yesterday J-tube site c/d/i VAC with good seal  Lab Results:   Recent Labs  11/13/12 0400 11/14/12 0451  WBC 31.8* 23.9*  HGB 8.0* 7.2*  HCT 23.5* 21.5*  PLT 346 384   BMET  Recent Labs  11/13/12 0400 11/14/12 0451  NA 138 139  K 3.3* 3.3*  CL 104 105  CO2 27 29  GLUCOSE 187* 155*  BUN 19 19  CREATININE 0.50 0.46*  CALCIUM 8.3* 8.6   PT/INR No results found for this basename: LABPROT, INR,  in the last 72 hours ABG  Recent Labs  11/11/12 1751 11/12/12 0500  PHART 7.389 7.380  HCO3 21.4 21.8    Studies/Results: Ir Fluoro Guide Cv Line Right  11/12/2012   *RADIOLOGY REPORT*  Clinical history:67 year old with recent small bowel obstruction and postop.  The patient needs TPN.  The patient currently has a single lumen right arm PICC line and needs a triple lumen PICC line.  PROCEDURE(S):  EXCHANGE OF PICC LINE WITH FLUOROSCOPY  Physician: Rachelle Hora. Henn, MD  Fluoroscopy time: 1 minute and 54 seconds  Procedure:The procedure was explained to the patient.  The risks and benefits of the procedure were discussed and the patient's questions were addressed.  Informed consent was obtained from the patient.  The right arm was prepped and draped in a sterile fashion.  Maximal barrier sterile technique was utilized including caps, mask, sterile gowns, sterile gloves, sterile drape, hand hygiene and skin antiseptic.  The PICC line was removed over a 0.018 wire.  A new triple lumen catheter was cut to 37 cm and easily advanced over a wire.  The tip of the catheter was placed in the lower SVC.  Unfortunately, the lumens would not aspirate. Contrast injection demonstrated a patent SVC but there is probably a fibrin sheath.  As a result, this catheter removed over the wire and exchanged for a 39 cm triple lumen PICC line.  This catheter was placed near the SVC and right atrium junction.  The middle lumen would not aspirate but the other two lumens did aspirate well.  All of the lumens flushed well.  Catheter was sutured to the skin.  Findings:Fibrin sheath in the lower SVC.  The new catheter tip is near the junction of the SVC and right atrium.  Complications: None  Impression:Successful exchange of the right arm PICC line.  Fibrin sheath in the lower SVC.   Original Report Authenticated By: Richarda Overlie, M.D.    Anti-infectives: Anti-infectives   Start  Dose/Rate Route Frequency Ordered Stop   11/10/12 1000  piperacillin-tazobactam (ZOSYN) IVPB 3.375 g     3.375 g 12.5 mL/hr over 240 Minutes Intravenous 3 times per day 11/10/12 0937        Assessment/Plan: s/p Procedure(s): EXPLORATORY LAPAROTOMY (N/A) SMALL BOWEL RESECTION (N/A) APPLICATION OF WOUND VAC (N/A) PLACEMENT OF FEEDING JEJUNOSTOMY TUBE (N/A) LYSIS OF ADHESION (N/A) Continue Foley for now Tachycardia/ acute blood loss anemia/  anticoagulation - the patient needs to be anticoagulated due to recent bilateral pulmonary emboli.  However, she may be oozing some in her abdomen.  Will transfuse 2 u PRBC today to hopefully help with the tachycardia;  BP stable; good UOP  ID - WBC decreasing; afebrile; continue Zosyn Pulm - crackles; slight chest pain - obtain CXR today GI - no bowel function yet; will check water-soluble contrast study via NG and J-tube today - to evaluate for possible continued gastric leak and to assess the jejunostomy and the new small bowel anastomosis.  If study is OK, will begin tube feeds tomorrow; continue RUQ drain for now Pain control - anxiety - will add PRN Ativan; switch to Dilaudid PCA VAC change tomorrow Continue Foley for fluid status monitoring at least one more day Continue ICU for at least one more day  Critical Care - 35 minutes    LOS: 5 days    Saanya Zieske K. 11/14/2012

## 2012-11-14 NOTE — Progress Notes (Signed)
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 Avelox (Moxifloxacin Hcl In Nacl) Nausea And Vomiting  . Betadine (Povidone Iodine) Itching and Rash  . Alendronate Sodium Nausea And Vomiting  . Aspirin Nausea Only  . Codeine Nausea And Vomiting  . Doxycycline     Pt doesn't remember reaction  . Fluconazole     Pt doesn't remember reaction  . Hydrocodone Nausea And Vomiting    GI distress  . Neurontin (Gabapentin) Other (See Comments)    Mood changes   . Nsaids Other (See Comments)    Severe gastritis & perforation - avoid NSAIDs when possible  . Sertraline Hcl Other (See Comments)    Hallucinations   . Sulfa Antibiotics Rash    Patient Measurements: Height: 5' 4.96" (165 cm) Weight: 102 lb 4.7 oz (46.4 kg) IBW/kg (Calculated) : 56.91  Vital Signs: Temp: 97.8 F (36.6 C) (07/25 0742) Temp src: Oral (07/25 0742) BP: 121/57 mmHg (07/25 0600) Pulse Rate: 118 (07/25 0700) Intake/Output from previous day: 07/24 0701 - 07/25 0700 In: 2242 [I.V.:30; NG/GT:30; IV Piggyback:452; TPN:1730] Out: 1940 [Urine:1685; Drains:255] Intake/Output from this shift: Total I/O In: 50 [IV Piggyback:50] Out: -   Labs:  Recent Labs  11/12/12 0248 11/13/12 0400 11/14/12 0451  WBC 36.0* 31.8* 23.9*  HGB 9.1* 8.0* 7.2*  HCT 27.8* 23.5* 21.5*  PLT 324 346 384     Recent Labs  11/11/12 1800 11/12/12 0248 11/13/12 0400 11/14/12 0451  NA 131* 131* 138 139  K 4.6 3.9 3.3* 3.3*  CL 97 103 104 105  CO2 21 21 27 29   GLUCOSE 102* 201* 187* 155*  BUN 17 21 19 19   CREATININE 0.62 0.53 0.50 0.46*  CALCIUM 9.3 8.2* 8.3* 8.6  MG  --  1.7 2.2 2.0  PHOS  --  2.2* 2.1* 2.7  PROT 7.4  --  5.6*  --   ALBUMIN 2.1*  --  1.4*  --   AST 34  --  20  --   ALT 15  --  12  --   ALKPHOS 96  --  83  --   BILITOT 0.5  --  0.1*  --    Estimated Creatinine Clearance: 51.4 ml/min (by  C-G formula based on Cr of 0.46).    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 Now, s/p exp lap 7/22 with LOA, SBR, wound vac and J-tube placement. Tolerating TNA at goal. Plan is for TNA for a few days until J-tube is ready to be used.  Pt with severe malnutrition in the context of chronic illness.  GI: hx GERD. Chronic TPN for gastrocutaneous fistula - LFTs WNL, on PPI IV. Prealbumin low at 13.5.   Endo: no hx - CBGs controlled on and off TPN  Lytes: K+ 3.3 and 2 runs ordered, others WNL  Renal: SCr 0.46 (stable), CrCL ~50 ml/min, no IVF while off TPN, excellent UOP   Pulm: hx COPD - now on RA  Cards: no hx - BP improving, tachycardia persists, TG 88  Hepatobil: LFTs WNL   Neuro: hx depression / neuromuscular disorder / fibromyalgia - fentanyl patch, GCS 15  AC: Lovenox for hx PE (Xarelto PTA), hgb trending down, plts returned to normal, no bleeding   ID: Zosyn D#4 (7/21 >> ), empiric for leukocytosis + possible gastroenteritis, afebrile, WBC peaked 47.7, trended down to 23.9, LA 3 >> 2.7, UA unimpressive   Best Practices: full dose lovenox, home meds addressed  TPN Access: PICC line  TPN day#: continued from home   Current Nutrition:  Home cyclic TPN: 78 gm protein/d, 312 gm dextrose/d, 50 gm lipids MWF. Total kCal/d based on weekly average = 1659, meeting 100% goals  MVI MWF, Addamel 2.93mL daily, no insulin  12 hr cycle   Nutritional Goals:  1500-1700 kCal, 80-95 grams of protein per day   Plan:  -  Cycle Clinimix E 5/20 over 12 hours: 50 ml/hr x 1 hr, 158 ml/hr x 10 hrs, 50 ml/hr x 1 hr. Clinimix and lipids MWF will provide a weekly  average of 1684 kCal/d and 84gm of protein per day.  - Lipids 20 ml/hr x 12 hrs on MWF  - Daily multivitamin and trace elements  - Zinc 5mg  and Vit C 500mg  added to TPN ddaily - Continue SSI (0500, 0700, 1700, 2000, 2400) - Additional 2 runs KCL - F/U transition to TF when J-tube ready      Glori Machnik D. Laney Potash, PharmD, BCPS Pager:  [phone] 11/14/2012, 8:15 AM

## 2012-11-14 NOTE — Progress Notes (Signed)
ANTICOAGULATION CONSULT NOTE - Follow Up Consult  Pharmacy Consult for Lovenox Indication: pulmonary embolus  Allergies  Allergen Reactions  . Avelox (Moxifloxacin Hcl In Nacl) Nausea And Vomiting  . Betadine (Povidone Iodine) Itching and Rash  . Alendronate Sodium Nausea And Vomiting  . Aspirin Nausea Only  . Codeine Nausea And Vomiting  . Doxycycline     Pt doesn't remember reaction  . Fluconazole     Pt doesn't remember reaction  . Hydrocodone Nausea And Vomiting    GI distress  . Neurontin (Gabapentin) Other (See Comments)    Mood changes   . Nsaids Other (See Comments)    Severe gastritis & perforation - avoid NSAIDs when possible  . Sertraline Hcl Other (See Comments)    Hallucinations   . Sulfa Antibiotics Rash    Patient Measurements: Height: 5' 4.96" (165 cm) Weight: 102 lb 4.7 oz (46.4 kg) IBW/kg (Calculated) : 56.91 Heparin Dosing Weight:    Vital Signs: Temp: 97.8 F (36.6 C) (07/25 0742) Temp src: Oral (07/25 0742) BP: 116/52 mmHg (07/25 0800) Pulse Rate: 109 (07/25 0800)  Labs:  Recent Labs  11/12/12 0248 11/13/12 0400 11/14/12 0451  HGB 9.1* 8.0* 7.2*  HCT 27.8* 23.5* 21.5*  PLT 324 346 384  CREATININE 0.53 0.50 0.46*    Estimated Creatinine Clearance: 51.4 ml/min (by C-G formula based on Cr of 0.46).   Assessment: 67 yo female presented with abdominal pain and found to have peritonitis; TF on hold s/p anastomosis. Patient has history of bilateral PE's, on xarelto at home (last dose 7/21), currently on lovenox.  Anticoagulation - full dose lovenox for B PE. Hgb down to 7.2. MD aware. Plts up to 384. Renal function stable. Transfuse 2 units PRBC.  Goal of Therapy:  Anti-Xa level 0.6-1.2 units/ml 4hrs after LMWH dose given Monitor platelets by anticoagulation protocol: Yes   Plan:  1. Continue lovenox 45 mg [place] q12h. Dose ok 2. Continue Zosyn 3.375 IV q8h dose ok 3. Transfuse 2 units PRBC   Christophere Hillhouse S. Merilynn Finland, PharmD,  BCPS Clinical Staff Pharmacist Pager [phone]  Misty Stanley Stillinger 11/14/2012,9:12 AM

## 2012-11-15 DIAGNOSIS — K316 Fistula of stomach and duodenum: Secondary | ICD-10-CM

## 2012-11-15 DIAGNOSIS — E46 Unspecified protein-calorie malnutrition: Secondary | ICD-10-CM

## 2012-11-15 DIAGNOSIS — D68318 Other hemorrhagic disorder due to intrinsic circulating anticoagulants, antibodies, or inhibitors: Secondary | ICD-10-CM

## 2012-11-15 LAB — TYPE AND SCREEN
ABO/RH(D): B POS
Antibody Screen: NEGATIVE
Unit division: 0
Unit division: 0

## 2012-11-15 LAB — GLUCOSE, CAPILLARY
Glucose-Capillary: 138 mg/dL — ABNORMAL HIGH (ref 70–99)
Glucose-Capillary: 139 mg/dL — ABNORMAL HIGH (ref 70–99)
Glucose-Capillary: 73 mg/dL (ref 70–99)
Glucose-Capillary: 90 mg/dL (ref 70–99)
Glucose-Capillary: 94 mg/dL (ref 70–99)

## 2012-11-15 LAB — CBC
HCT: 30.5 % — ABNORMAL LOW (ref 36.0–46.0)
MCV: 74.9 fL — ABNORMAL LOW (ref 78.0–100.0)
RBC: 4.07 MIL/uL (ref 3.87–5.11)
WBC: 22.3 10*3/uL — ABNORMAL HIGH (ref 4.0–10.5)

## 2012-11-15 LAB — BASIC METABOLIC PANEL
BUN: 20 mg/dL (ref 6–23)
CO2: 27 mEq/L (ref 19–32)
Chloride: 102 mEq/L (ref 96–112)
Creatinine, Ser: 0.59 mg/dL (ref 0.50–1.10)
Potassium: 3.3 mEq/L — ABNORMAL LOW (ref 3.5–5.1)

## 2012-11-15 MED ORDER — POTASSIUM CHLORIDE 10 MEQ/50ML IV SOLN
10.0000 meq | INTRAVENOUS | Status: AC
  Administered 2012-11-15 (×4): 10 meq via INTRAVENOUS
  Filled 2012-11-15: qty 200

## 2012-11-15 MED ORDER — ZINC TRACE METAL 1 MG/ML IV SOLN
INTRAVENOUS | Status: AC
  Administered 2012-11-15: 17:00:00 via INTRAVENOUS
  Filled 2012-11-15: qty 2000

## 2012-11-15 MED ORDER — VITAL AF 1.2 CAL PO LIQD
1000.0000 mL | ORAL | Status: DC
  Administered 2012-11-15 – 2012-11-16 (×2): 1000 mL
  Filled 2012-11-15: qty 1000

## 2012-11-15 NOTE — Progress Notes (Signed)
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  Patient Measurements: Height: 5' 4.96" (165 cm) Weight: 113 lb 12.8 oz (51.619 kg) IBW/kg (Calculated) : 56.91  Vital Signs: Temp: 98 F (36.7 C) (07/26 0805) Temp src: Oral (07/26 0805) BP: 139/75 mmHg (07/26 0700) Pulse Rate: 109 (07/26 0700) Intake/Output from previous day: 07/25 0701 - 07/26 0700 In: 1846.5 [I.V.:201.5; Blood:715; NG/GT:330; IV Piggyback:300] Out: 2755 [Urine:1795; Emesis/NG output:850; Drains:110] Intake/Output from this shift:    Labs:  Recent Labs  11/13/12 0400 11/14/12 0451 11/15/12 0345  WBC 31.8* 23.9* 22.3*  HGB 8.0* 7.2* 10.9*  HCT 23.5* 21.5* 30.5*  PLT 346 384 401*     Recent Labs  11/13/12 0400 11/14/12 0451 11/15/12 0345  NA 138 139 138  K 3.3* 3.3* 3.3*  CL 104 105 102  CO2 27 29 27   GLUCOSE 187* 155* 138*  BUN 19 19 20   CREATININE 0.50 0.46* 0.59  CALCIUM 8.3* 8.6 8.9  MG 2.2 2.0  --   PHOS 2.1* 2.7  --   PROT 5.6*  --   --   ALBUMIN 1.4*  --   --   AST 20  --   --   ALT 12  --   --   ALKPHOS 83  --   --   BILITOT 0.1*  --   --    Estimated Creatinine Clearance: 57.1 ml/min (by C-G formula based on Cr of 0.59).    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 Now, s/p exp lap 7/22 with LOA, SBR, wound vac and J-tube placement. Tolerating TNA at goal. Plan is for TNA for a few days until J-tube is ready to be used.  Pt with severe malnutrition in the context of chronic illness.  GI: hx GERD. Chronic TPN for gastrocutaneous fistula - LFTs WNL, on PPI IV. Prealbumin  low at 13.5.   Endo: no hx - CBGs controlled on and off TPN  Lytes: K+ remains low at 3.3 despite repletion with 4 K-runs yesterday, others WNL  Renal: SCr 0.46 (stable), CrCL ~50 ml/min, no IVF while off TPN, excellent UOP   Pulm: hx COPD - now on RA  Cards: no hx - BP improving, tachycardia persists, TG 88   Hepatobil: LFTs WNL   Neuro: hx depression / neuromuscular disorder / fibromyalgia - fentanyl patch, GCS 15  AC: Lovenox for hx PE (Xarelto PTA), hgb trending down, plts returned to normal, no bleeding   ID: Zosyn D#5 (7/21 >> ), empiric for leukocytosis + possible gastroenteritis, afebrile, WBC peaked 47.7, trended down to 22, LA 3 >> 2.7, UA unimpressive   Best Practices: full dose lovenox, home meds addressed  TPN Access: PICC line  TPN day#: continued from home   Current Nutrition:  Home cyclic TPN: 78 gm protein/d, 312 gm dextrose/d, 50 gm lipids MWF. Total kCal/d based on weekly average = 1659, meeting 100% goals  MVI MWF, Addamel 2.51mL daily, no insulin  12 hr cycle   Nutritional Goals:  1500-1700 kCal, 80-95 grams of protein per day   Plan:  -  Cycle Clinimix E 5/20 over 12 hours: 50 ml/hr x 1 hr, 158 ml/hr x 10 hrs, 50 ml/hr x 1 hr. Clinimix and lipids MWF will provide a weekly average of 1684 kCal/d and 84gm of protein per day.  - Lipids 20 ml/hr x 12 hrs on MWF  - Daily multivitamin and trace elements  - Zinc 5mg  and Vit C 500mg  added to TPN ddaily - Continue SSI (0500, 0700, 1700, 2000, 2400) - Replete K with 4 runs KCL - F/U transition to TF when J-tube ready   Cynthia Bowen L. Illene Bolus, PharmD, BCPS Clinical Pharmacist Pager: [phone] Pharmacy: [phone] 11/15/2012 8:12 AM

## 2012-11-15 NOTE — Progress Notes (Signed)
Pt continually removing [place] and [employer] monitoring tubing and manipulating her NG tube.  Pt is continually reminded to leave tubes alone.  Will continue to monitor pt.

## 2012-11-15 NOTE — Plan of Care (Signed)
Problem: Phase I Progression Outcomes Goal: Pain controlled with appropriate interventions Outcome: Progressing Fentanyl patch and pca dilaudid. Using maximum amount of dilaudid.

## 2012-11-15 NOTE — Progress Notes (Signed)
4 Days Post-Op  Subjective: Pain seems better controlled with PCA pump - was able to rest better last night No bowel function yet VAC - canister shows some bilious output Contrast study shows persistent gastrocutaneous fistula - jejunostomy and SB anastomosis appear patent with no leak  Objective: Vital signs in last 24 hours: Temp:  [97.8 F (36.6 C)-99.8 F (37.7 C)] 98.6 F (37 C) (07/26 0004) Pulse Rate:  [90-113] 109 (07/26 0700) Resp:  [12-28] 22 (07/26 0700) BP: (110-164)/(52-98) 139/75 mmHg (07/26 0700) SpO2:  [93 %-100 %] 98 % (07/26 0700) Weight:  [113 lb 12.8 oz (51.619 kg)] 113 lb 12.8 oz (51.619 kg) (07/26 0600) Last BM Date: 11/09/12  Intake/Output from previous day: 07/25 0701 - 07/26 0700 In: 1846.5 [I.V.:201.5; Blood:715; NG/GT:330; IV Piggyback:300] Out: 2755 [Urine:1795; Emesis/NG output:850; Drains:110] Intake/Output this shift:    General appearance: alert, cooperative and no distress Resp: clear to auscultation bilaterally Cardio: regular rate and rhythm, S1, S2 normal, no murmur, click, rub or gallop GI: still with some moderate distention; hypoactive bowel sounds VAC removed - bilious drainage from small fistula in upper midline; the remainder of the wound seems to be granulating  Lab Results:   Recent Labs  11/14/12 0451 11/15/12 0345  WBC 23.9* 22.3*  HGB 7.2* 10.9*  HCT 21.5* 30.5*  PLT 384 401*   BMET  Recent Labs  11/14/12 0451 11/15/12 0345  NA 139 138  K 3.3* 3.3*  CL 105 102  CO2 29 27  GLUCOSE 155* 138*  BUN 19 20  CREATININE 0.46* 0.59  CALCIUM 8.6 8.9   PT/INR No results found for this basename: LABPROT, INR,  in the last 72 hours ABG No results found for this basename: PHART, PCO2, PO2, HCO3,  in the last 72 hours  Studies/Results: Dg Chest Port 1 View  11/14/2012   *RADIOLOGY REPORT*  Clinical Data: Slight inspiratory chest pain  PORTABLE CHEST - 1 VIEW  Comparison: November 12, 2012.  Findings: Stable  cardiomediastinal silhouette.  Nasogastric tube tip seen in proximal stomach.  Right-sided PICC line is unchanged in position.  Endotracheal tube has been removed.  Status post fusion of lower cervical spine.  Interval development of minimal right basilar opacity most consistent with subsegmental atelectasis.  Slightly increased left basilar opacity is noted concerning for worsening atelectasis or pneumonia with associated pleural effusion.  IMPRESSION: Slightly increased bilateral basilar opacities as described above.   Original Report Authenticated By: Lupita Raider.,  M.D.   Dg Kayleen Memos W/water Sol Cm  11/14/2012   *RADIOLOGY REPORT*  Clinical Data:  .  Gastrocutaneous fistula.  Recent placement of jejunostomy tube.  Nausea.  UPPER GI SERIES WITHOUT KUB  Technique:  Routine upper GI series was performed with water soluble barium.  Fluoroscopy Time: 2 minutes, 10 seconds.  Comparison:  CT abdomen pelvis 11/10/2012.  Findings: 120 ml Omnipaque-300 and 60 ml of water were injected through the patient's NG tube.  The patient was then placed right side down.  There was prompt leakage of contrast material through the gastrocutaneous fistula seen on the patient's CT scan onto her anterior abdominal wall.  60 ml Omnipaque-300 was injected through the patient's jejunostomy tube.  Contrast filled the jejunum and flowed into distal small bowel loops.  No leakage of contrast or other complicating feature is identified.  IMPRESSION:  1.  Persistent gastrocutaneous fistula. 2.  Jejunostomy tube in place without evidence of complication. Negative for anastomotic leak.   Original Report Authenticated By: Maisie Fus  Maricela Curet, M.D.    Anti-infectives: Anti-infectives   Start     Dose/Rate Route Frequency Ordered Stop   11/10/12 1000  piperacillin-tazobactam (ZOSYN) IVPB 3.375 g     3.375 g 12.5 mL/hr over 240 Minutes Intravenous 3 times per day 11/10/12 1610        Assessment/Plan: s/p Procedure(s): EXPLORATORY  LAPAROTOMY (N/A) SMALL BOWEL RESECTION (N/A) APPLICATION OF WOUND VAC (N/A) PLACEMENT OF FEEDING JEJUNOSTOMY TUBE (N/A) LYSIS OF ADHESION (N/A) Will start trickle tube feeds Transfer to stepdown D/C VAC Wet to dry dressing q6 and PRN - try to keep gastric drainage in upper part of wound to allow lower wound to heal This persistent gastrocutaneous fistula is frustrating, since we tried to visualize this during surgery on Tuesday by filling the stomach with blue saline.  At that time, we were unable to visualize any leak, but now the stomach seems to be leaking again.  We will try to improve her nutrition with enteral feeds in hopes of getting her off TNA and allowing the gastrocutaneous fistula to heal on its own  Continue abx until WBC normal - WBC decreasing slowly; afebrile Will leave Foley for now to monitor fluid status   LOS: 6 days    Cynthia Bowen K. 11/15/2012

## 2012-11-16 LAB — GLUCOSE, CAPILLARY
Glucose-Capillary: 108 mg/dL — ABNORMAL HIGH (ref 70–99)
Glucose-Capillary: 95 mg/dL (ref 70–99)
Glucose-Capillary: 93 mg/dL (ref 70–99)
Glucose-Capillary: 87 mg/dL (ref 70–99)

## 2012-11-16 LAB — BASIC METABOLIC PANEL
Calcium: 8.7 mg/dL (ref 8.4–10.5)
GFR calc Af Amer: >90 mL/min (ref >90)
GFR calc non Af Amer: >90 mL/min (ref >90)
Potassium: 3.7 mEq/L (ref 3.5–5.1)
Sodium: 137 mEq/L (ref 135–145)

## 2012-11-16 LAB — CBC
MCH: 26.3 pg (ref 26.0–34.0)
MCHC: 34.6 g/dL (ref 30.0–36.0)
Platelets: 420 10*3/uL — ABNORMAL HIGH (ref 150–400)

## 2012-11-16 MED ORDER — VITAL AF 1.2 CAL PO LIQD
1000.0000 mL | ORAL | Status: DC
  Administered 2012-11-16: 1000 mL
  Filled 2012-11-16: qty 1000

## 2012-11-16 MED ORDER — CLINIMIX E/DEXTROSE (5/20) 5 % IV SOLN
INTRAVENOUS | Status: AC
  Administered 2012-11-16: 18:00:00 via INTRAVENOUS
  Filled 2012-11-16: qty 2000

## 2012-11-16 MED ORDER — POTASSIUM CHLORIDE 10 MEQ/50ML IV SOLN
10.0000 meq | INTRAVENOUS | Status: AC
  Administered 2012-11-16 (×3): 10 meq via INTRAVENOUS
  Filled 2012-11-16: qty 100
  Filled 2012-11-16: qty 50

## 2012-11-16 MED ORDER — SODIUM CHLORIDE 0.9 % IV SOLN
INTRAVENOUS | Status: DC
  Administered 2012-11-16 – 2012-11-17 (×2): via INTRAVENOUS
  Administered 2012-11-17: 20 mL/h via INTRAVENOUS
  Administered 2012-11-19 – 2012-12-07 (×14): via INTRAVENOUS
  Administered 2012-12-07: 100 mL/h via INTRAVENOUS
  Administered 2012-12-07 – 2012-12-08 (×2): via INTRAVENOUS
  Administered 2012-12-08: 100 mL/h via INTRAVENOUS
  Administered 2012-12-09: via INTRAVENOUS

## 2012-11-16 NOTE — Progress Notes (Signed)
Upon initial assessment, patient is neuro intact, sitting the chair, abdominal assessment: all dressing clean/dry/intact, Patient has a JP drain to Rt side and J-tube to the Left Side.  Sacral protective dressing is present.  Patient is appropriate, is able to voice needs, says she has occasionally feels nauseous, but it subsides. Overall, patient is stable and was instructed to call RN she is has any needs.

## 2012-11-16 NOTE — Progress Notes (Signed)
5 Days Post-Op  Subjective: Patient resting comfortably Had a bowel movement today Tolerating trickle feeds without problems - "it feels funny, but doesn't hurt"  Objective: Vital signs in last 24 hours: Temp:  [97.4 F (36.3 C)-98.8 F (37.1 C)] 97.4 F (36.3 C) (07/27 1209) Pulse Rate:  [91-172] 93 (07/27 1200) Resp:  [16-99] 20 (07/27 1200) BP: (103-146)/(59-93) 137/77 mmHg (07/27 1200) SpO2:  [1 %-100 %] 98 % (07/27 1200) Weight:  [104 lb 15 oz (47.6 kg)] 104 lb 15 oz (47.6 kg) (07/27 0700) Last BM Date: 11/16/12  Intake/Output from previous day: 07/26 0701 - 07/27 0700 In: 2852.9 [I.V.:108.8; NG/GT:208.2; IV Piggyback:350; TPN:1706] Out: 3540 [Urine:2845; Emesis/NG output:590; Drains:105] Intake/Output this shift: Total I/O In: 478.5 [P.O.:120; I.V.:28.5; Other:140; NG/GT:40; IV Piggyback:150] Out: 775 [Urine:475; Emesis/NG output:300]  General appearance: alert, cooperative and no distress Resp: clear to auscultation bilaterally Cardio: regular rate and rhythm, S1, S2 normal, no murmur, click, rub or gallop GI: soft, j-tube site c/d/i RUQ drain - minimal greenish drainage Midline wound - bilious drainage from fistula in upper midline; lower part beginning to granulate  Lab Results:   Recent Labs  11/15/12 0345 11/16/12 0500  WBC 22.3* 21.8*  HGB 10.9* 10.8*  HCT 30.5* 31.2*  PLT 401* 420*   BMET  Recent Labs  11/15/12 0345 11/16/12 0500  NA 138 137  K 3.3* 3.7  CL 102 102  CO2 27 26  GLUCOSE 138* 109*  BUN 20 19  CREATININE 0.59 0.52  CALCIUM 8.9 8.7   PT/INR No results found for this basename: LABPROT, INR,  in the last 72 hours ABG No results found for this basename: PHART, PCO2, PO2, HCO3,  in the last 72 hours  Studies/Results: No results found.  Anti-infectives: Anti-infectives   Start     Dose/Rate Route Frequency Ordered Stop   11/10/12 1000  piperacillin-tazobactam (ZOSYN) IVPB 3.375 g     3.375 g 12.5 mL/hr over 240 Minutes  Intravenous 3 times per day 11/10/12 1610        Assessment/Plan: s/p Procedure(s): EXPLORATORY LAPAROTOMY (N/A) SMALL BOWEL RESECTION (N/A) APPLICATION OF WOUND VAC (N/A) PLACEMENT OF FEEDING JEJUNOSTOMY TUBE (N/A) LYSIS OF ADHESION (N/A) d/c foley Advance tube feeds to 20 ml/hr Continue abx and dressing changes Chronic gastrocutaneous fistula - continuing to drain; will probably slow down healing of midline wound Hopefully, as nutrition improves with enteral feeds, the wound will heal and the fistula will close   LOS: 7 days    Ellsie Violette K. 11/16/2012

## 2012-11-16 NOTE — Progress Notes (Signed)
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  Patient Measurements: Height: 5' 4.96" (165 cm) Weight: 104 lb 15 oz (47.6 kg) IBW/kg (Calculated) : 56.91  Vital Signs: Temp: 98.3 F (36.8 C) (07/27 0312) Temp src: Axillary (07/27 0312) BP: 141/78 mmHg (07/27 0700) Pulse Rate: 98 (07/27 0700) Intake/Output from previous day: 07/26 0701 - 07/27 0700 In: 2852.9 [I.V.:108.8; NG/GT:208.2; IV Piggyback:350; TPN:1706] Out: 3540 [Urine:2845; Emesis/NG output:590; Drains:105] Intake/Output from this shift:    Labs:  Recent Labs  11/14/12 0451 11/15/12 0345 11/16/12 0500  WBC 23.9* 22.3* 21.8*  HGB 7.2* 10.9* 10.8*  HCT 21.5* 30.5* 31.2*  PLT 384 401* 420*     Recent Labs  11/14/12 0451 11/15/12 0345 11/16/12 0500  NA 139 138 137  K 3.3* 3.3* 3.7  CL 105 102 102  CO2 29 27 26   GLUCOSE 155* 138* 109*  BUN 19 20 19   CREATININE 0.46* 0.59 0.52  CALCIUM 8.6 8.9 8.7  MG 2.0  --   --   PHOS 2.7  --   --    Estimated Creatinine Clearance: 52.7 ml/min (by C-G formula based on Cr of 0.52).    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 Now, s/p exp lap 7/22 with LOA, SBR, wound vac and J-tube placement. Tolerating TNA at goal. Plan is for TNA for a few days until J-tube is ready to be used.  Pt with severe malnutrition in the context of chronic illness.  Noted TF -- vital 1.2 at 10 ml/hr started 7/26 via J-tube with orders not to advance.  No residuals noted.  GI: hx GERD. Chronic TPN for gastrocutaneous fistula - LFTs WNL, on PPI IV. Prealbumin low at 13.5.   Endo:  no hx - CBGs controlled on and off TPN  Lytes: K+ improved at 3.7 after repletion with 4 K-runs yesterday, others WNL  Renal: SCr 0.46 (stable), CrCL ~50 ml/min, no IVF while off TPN, excellent UOP   Pulm: hx COPD - now on 2L [place]  Cards: no hx - BP improving, tachycardia persists, TG 88   Hepatobil: LFTs WNL   Neuro: hx depression / neuromuscular disorder / fibromyalgia - fentanyl patch, GCS 15  AC: Lovenox for hx PE (Xarelto PTA), hgb trending down, plts returned to normal, no bleeding   ID: Zosyn D#6 (7/21 >> ), empiric for leukocytosis + possible gastroenteritis, afebrile, WBC peaked 47.7, trended down to 21.8, LA 3 >> 2.7, UA unimpressive   Best Practices: full dose lovenox, home meds addressed  TPN Access: PICC line  TPN day#: continued from home   Current Nutrition:  Home cyclic TPN: 78 gm protein/d, 312 gm dextrose/d, 50 gm lipids MWF. Total kCal/d based on weekly average = 1659, meeting 100% goals  MVI MWF, Addamel 2.63mL daily, no insulin  12 hr cycle   Nutritional Goals:  1500-1700 kCal, 80-95 grams of protein per day   Plan:  -  Cycle Clinimix E 5/20 over 12 hours: 50 ml/hr x 1 hr, 158 ml/hr x 10 hrs, 50 ml/hr x 1 hr. Clinimix and lipids MWF will provide  a weekly average of 1684 kCal/d and 84gm of protein per day.  - Lipids 20 ml/hr x 12 hrs on MWF  - Daily multivitamin and trace elements  - Zinc 5mg  and Vit C 500mg  added to TPN daily - Continue SSI (0500, 0700, 1700, 2000, 2400) - Replete K with 3 runs KCL - F/U TF tolerance and advancement via J-tube   Brayla Pat L. Illene Bolus, PharmD, BCPS Clinical Pharmacist Pager: [phone] Pharmacy: [phone] 11/16/2012 7:32 AM

## 2012-11-16 NOTE — Plan of Care (Signed)
Problem: Phase I Progression Outcomes Goal: Pain controlled with appropriate interventions Outcome: Progressing Better control of pain today. Still reaching maximum limit for Dilaudid PCA and has 75 mcg fentanyl patches on. MD aware. Goal: OOB as tolerated unless otherwise ordered Outcome: Progressing Ambulated and up to chair. Tolerated well. Goal: Sutures/staples intact Outcome: Not Progressing Open surgical wound. Packing with Wet to Dry NS kerlix. Goal: Voiding-avoid urinary catheter unless indicated Outcome: Completed/Met Date Met:  11/16/12 Catheter d/ced 11/16/12. Voiding qs

## 2012-11-17 LAB — GLUCOSE, CAPILLARY
Glucose-Capillary: 136 mg/dL — ABNORMAL HIGH (ref 70–99)
Glucose-Capillary: 159 mg/dL — ABNORMAL HIGH (ref 70–99)

## 2012-11-17 LAB — DIFFERENTIAL
Eosinophils Relative: 1 % (ref 0–5)
Lymphs Abs: 1.5 10*3/uL (ref 0.7–4.0)
Monocytes Relative: 7 % (ref 3–12)

## 2012-11-17 LAB — COMPREHENSIVE METABOLIC PANEL WITH GFR
ALT: 14 U/L (ref 0–35)
AST: 22 U/L (ref 0–37)
Albumin: 1.5 g/dL — ABNORMAL LOW (ref 3.5–5.2)
Alkaline Phosphatase: 136 U/L — ABNORMAL HIGH (ref 39–117)
BUN: 19 mg/dL (ref 6–23)
CO2: 27 meq/L (ref 19–32)
Calcium: 8.4 mg/dL (ref 8.4–10.5)
Chloride: 100 meq/L (ref 96–112)
Creatinine, Ser: 0.49 mg/dL — ABNORMAL LOW (ref 0.50–1.10)
GFR calc Af Amer: >90 mL/min
GFR calc non Af Amer: >90 mL/min
Glucose, Bld: 124 mg/dL — ABNORMAL HIGH (ref 70–99)
Potassium: 4.2 meq/L (ref 3.5–5.1)
Sodium: 135 meq/L (ref 135–145)
Total Bilirubin: 0.6 mg/dL (ref 0.3–1.2)
Total Protein: 5.8 g/dL — ABNORMAL LOW (ref 6.0–8.3)

## 2012-11-17 LAB — CBC
Hemoglobin: 10.4 g/dL — ABNORMAL LOW (ref 12.0–15.0)
MCV: 77.7 fL — ABNORMAL LOW (ref 78.0–100.0)
Platelets: 435 10*3/uL — ABNORMAL HIGH (ref 150–400)
RBC: 4.08 MIL/uL (ref 3.87–5.11)
WBC: 21.7 10*3/uL — ABNORMAL HIGH (ref 4.0–10.5)

## 2012-11-17 LAB — TRIGLYCERIDES: Triglycerides: 77 mg/dL

## 2012-11-17 LAB — PREALBUMIN: Prealbumin: 4.9 mg/dL — ABNORMAL LOW (ref 17.0–34.0)

## 2012-11-17 MED ORDER — ZINC SULFATE 220 (50 ZN) MG PO CAPS
220.0000 mg | ORAL_CAPSULE | Freq: Every day | ORAL | Status: DC
  Administered 2012-11-17 – 2012-11-29 (×13): 220 mg
  Filled 2012-11-17 (×13): qty 1

## 2012-11-17 MED ORDER — VITAL AF 1.2 CAL PO LIQD
1000.0000 mL | ORAL | Status: DC
  Administered 2012-11-17 – 2012-12-01 (×12): 1000 mL
  Filled 2012-11-17 (×24): qty 1000

## 2012-11-17 MED ORDER — PANTOPRAZOLE SODIUM 40 MG PO PACK
40.0000 mg | PACK | Freq: Every day | ORAL | Status: DC
  Administered 2012-11-17 – 2012-12-02 (×16): 40 mg
  Filled 2012-11-17 (×22): qty 20

## 2012-11-17 MED ORDER — ADULT MULTIVITAMIN LIQUID CH
5.0000 mL | Freq: Every day | ORAL | Status: DC
  Administered 2012-11-17 – 2012-12-08 (×22): 5 mL
  Filled 2012-11-17 (×23): qty 5

## 2012-11-17 MED ORDER — VITAMIN C 500 MG/5ML PO SYRP
500.0000 mg | ORAL_SOLUTION | Freq: Every day | ORAL | Status: DC
  Administered 2012-11-17 – 2012-12-08 (×22): 500 mg
  Filled 2012-11-17 (×23): qty 5

## 2012-11-17 NOTE — Progress Notes (Signed)
6 Days Post-Op  Subjective: Patient has been ambulating with assistance around unit Less PCA usage One BM yesterday One episode of nausea  Objective: Vital signs in last 24 hours: Temp:  [97.4 F (36.3 C)-98.9 F (37.2 C)] 98.8 F (37.1 C) (07/28 0400) Pulse Rate:  [92-108] 105 (07/28 0600) Resp:  [16-97] 19 (07/28 0600) BP: (89-158)/(53-78) 115/65 mmHg (07/28 0600) SpO2:  [92 %-100 %] 94 % (07/28 0600) Weight:  [104 lb 15 oz (47.6 kg)] 104 lb 15 oz (47.6 kg) (07/27 0700) Last BM Date: 11/16/12  Intake/Output from previous day: 07/27 0701 - 07/28 0700 In: 1635.5 [P.O.:120; I.V.:485.5; NG/GT:400; IV Piggyback:250] Out: 2305 [Urine:2000; Emesis/NG output:300; Drains:5] Intake/Output this shift: Total I/O In: 720 [I.V.:400; NG/GT:220; IV Piggyback:100] Out: 1000 [Urine:1000]  General appearance: alert, cooperative and no distress Resp: rhonchi bibasilar Cardio: regular rate and rhythm, S1, S2 normal, no murmur, click, rub or gallop GI: mildly distended; +BS Erythema around RUQ drain site Less drainage from upper midline gastrocutaneous fistula  Lab Results:   Recent Labs  11/16/12 0500 11/17/12 0400  WBC 21.8* 21.7*  HGB 10.8* 10.4*  HCT 31.2* 31.7*  PLT 420* 435*   BMET  Recent Labs  11/16/12 0500 11/17/12 0400  NA 137 135  K 3.7 4.2  CL 102 100  CO2 26 27  GLUCOSE 109* 124*  BUN 19 19  CREATININE 0.52 0.49*  CALCIUM 8.7 8.4   PT/INR No results found for this basename: LABPROT, INR,  in the last 72 hours ABG No results found for this basename: PHART, PCO2, PO2, HCO3,  in the last 72 hours  Studies/Results: No results found.  Anti-infectives: Anti-infectives   Start     Dose/Rate Route Frequency Ordered Stop   11/10/12 1000  piperacillin-tazobactam (ZOSYN) IVPB 3.375 g     3.375 g 12.5 mL/hr over 240 Minutes Intravenous 3 times per day 11/10/12 1610        Assessment/Plan: s/p Procedure(s): EXPLORATORY LAPAROTOMY (N/A) SMALL BOWEL  RESECTION (N/A) APPLICATION OF WOUND VAC (N/A) PLACEMENT OF FEEDING JEJUNOSTOMY TUBE (N/A) LYSIS OF ADHESION (N/A) Advance tube feeds to goal rate - nutrition to calculate goal rate; Begin weaning off TNA when tube feeds at goal Continue abx Dressing changes Step-down bed pending   LOS: 8 days    Cynthia Bowen K. 11/17/2012

## 2012-11-17 NOTE — Progress Notes (Signed)
Patient's Right JP Drain was leaking green bile drainage that had soaked through the gauze, site under dressing was redden and leaking, site was gently cleaned and new drain sponges and gauze were applied, ABD was applied over and taped with paper tape.   Patient was encouraged to use PCA Pump if needed pre/during/post dressing change.

## 2012-11-17 NOTE — Progress Notes (Signed)
Patient's dressing were changed.    1)Right JP Drain was leaking green bile drainage that had soaked through the gauze, site under dressing was redden and leaking, site was gently cleaned and new drain sponges and gauze were applied and taped with paper tape.    2)Mid-abdominal site dressing was gently taken off, packing was removed, packing also had green/bile drainage on it, site was assessed (pink and has granulation tissue present), wet to dry curlex was packed in the site, gauze applied over, then ABD over it, secured with paper tape.   3)Left J-tube site dressing was removed, site was clean and intact.  New drain sponges and gauze were applied and secured dressing with paper tape.    Patient was encouraged to use PCA pain pre-dressing change, during, and post-dressing as patient needed.

## 2012-11-17 NOTE — Progress Notes (Signed)
ANTICOAGULATION CONSULT NOTE - Follow Up Consult  Pharmacy Consult:  Lovenox Indication:  PE  Allergies  Allergen Reactions  . Avelox (Moxifloxacin Hcl In Nacl) Nausea And Vomiting  . Betadine (Povidone Iodine) Itching and Rash  . Alendronate Sodium Nausea And Vomiting  . Aspirin Nausea Only  . Codeine Nausea And Vomiting  . Doxycycline     Pt doesn't remember reaction  . Fluconazole     Pt doesn't remember reaction  . Hydrocodone Nausea And Vomiting    GI distress  . Neurontin (Gabapentin) Other (See Comments)    Mood changes   . Nsaids Other (See Comments)    Severe gastritis & perforation - avoid NSAIDs when possible  . Sertraline Hcl Other (See Comments)    Hallucinations   . Sulfa Antibiotics Rash    Patient Measurements: Height: 5' 4.96" (165 cm) Weight: 104 lb 15 oz (47.6 kg) IBW/kg (Calculated) : 56.91  Vital Signs: Temp: 99.2 F (37.3 C) (07/28 1153) Temp src: Axillary (07/28 1153) BP: 132/67 mmHg (07/28 1155) Pulse Rate: 97 (07/28 1155)  Labs:  Recent Labs  11/15/12 0345 11/16/12 0500 11/17/12 0400  HGB 10.9* 10.8* 10.4*  HCT 30.5* 31.2* 31.7*  PLT 401* 420* 435*  CREATININE 0.59 0.52 0.49*    Estimated Creatinine Clearance: 52.7 ml/min (by C-G formula based on Cr of 0.49).      Assessment: 10 YOF with history of B/L PE's on Xarelto PTA, last dose 11/10/12.  Xarelto currently on hold and she is on full-dose Lovenox bridge.  Her renal function has been stable; no bleeding reported.  Noted plan to titrate TF to goal and wean TPN.   Goal of Therapy:  Anti-Xa level 0.6-1.2 units/ml 4hrs after LMWH dose given Monitor platelets by anticoagulation protocol: Yes    Plan:  - Continue Lovenox 45mg  SQ Q12H - Monitor renal fxn, CBC, s/sx of bleeding - F/U resume Xarelto as can be given PT (crush and given immediately) - F/U abx LOT    Neils Siracusa D. Laney Potash, PharmD, BCPS Pager:  [phone] 11/17/2012, 2:10 PM

## 2012-11-17 NOTE — Progress Notes (Signed)
NUTRITION FOLLOW UP  DOCUMENTATION CODES  Per approved criteria   -Severe malnutrition in the context of chronic illness  -Underweight    Intervention:   TPN per pharmacy. Once ready to advance enteral nutrition, recommend advancement of Vital AF 1.2 by 10 ml q 12 hours (or per team's discretion) via j-tube to goal of Vital AF 1.2 at 55 ml/h. Goal regimen will provide: 1584 kcal, 99 grams protein, 1070 ml free water. Recommend TPN wean with TF advancement.  RD to follow for nutrition care plan.  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 Ongoing.  Goal:   TPN + TF to meet > 90% of estimated nutrition needs - met  Monitor:   TPN prescription, weight, labs, I/O's, ability to transition to EN  Assessment:   Patient with hx of chronic gastrocutaneous fistula and malnutrition; on TPN at home; presented to ED with lower abdominal pain; abdominal X-ray concerning for partial small bowel obstruction.   Underwent the following procedures on 7/22: EXPLORATORY LAPAROTOMY  SMALL BOWEL RESECTION APPLICATION OF WOUND VAC PLACEMENT OF FEEDING JEJUNOSTOMY TUBE LYSIS OF ADHESION  Underwent contrast study on 7/25 - showed persistent gastrocutaneous fistula however, jejunostomy and SB anastomosis appear patent with no leak. Trickle tube feedings initiated 7/26 of Vital AF 1.2 at 20 ml/hr. This provides: 576 kcal, 36 grams protein, 389 ml free water.  Patient is receiving cyclic TPN with Clinimix E 5/20 x 12 hours: 50 ml/hr x 1 hr, 158 ml/hr x 10 hrs, 50 ml/hr x 1 hr. Lipids (20% IVFE @ 20 ml/hr x 12 hours) to be provided 3 times weekly (MWF). Provides 1684 kcal and 84 grams protein daily (based on weekly average). Meets 100% minimum estimated kcal and 100% minimum estimated protein needs.  Total TPN + TF regimens currently providing: 2260 kcal and 120 grams protein.  Most recent Triglycerides WNL. Prealbumin is low at 13.5.  Magnesium, phosphorus and potassium are all WNL.   Patient meets criteria for severe malnutrition in the context of chronic illness given 22% weight loss in 4 months and severe muscle loss (temples, clavicles, shoulders) and severe subcutaneous fat loss (biceps/triceps).   Height: Ht Readings from Last 1 Encounters:  11/10/12 5' 4.96" (1.65 m)    Weight Status:   Wt Readings from Last 1 Encounters:  11/16/12 104 lb 15 oz (47.6 kg)  Admit wt 95 lb; wt increasing likely 2/2 net fluid balance of +6.9 liters.  Re-estimated needs:  Kcal: 1500 - 1700 Protein: 80 - 95 grams Fluid: at least 1.5 liters daily  Skin:  Bilateral abdominal incision Wound VAC to abdominal wound  Diet Order: NPO   Intake/Output Summary (Last 24 hours) at 11/17/12 0959 Last data filed at 11/17/12 0900  Gross per 24 hour  Intake 1616.97 ml  Output   2215 ml  Net -598.03 ml    Last BM: 7/27   Labs:   Recent Labs Lab 11/13/12 0400 11/14/12 0451 11/15/12 0345 11/16/12 0500 11/17/12 0400  NA 138 139 138 137 135  K 3.3* 3.3* 3.3* 3.7 4.2  CL 104 105 102 102 100  CO2 27 29 27 26 27   BUN 19 19 20 19 19   CREATININE 0.50 0.46* 0.59 0.52 0.49*  CALCIUM 8.3* 8.6 8.9 8.7 8.4  MG 2.2 2.0  --   --  1.9  PHOS 2.1* 2.7  --   --  3.8  GLUCOSE 187* 155* 138* 109* 124*  CBG (last 3)   Recent Labs  11/17/12 0024 11/17/12 0625 11/17/12 0751  GLUCAP 159* 132* 136*   Triglycerides  Date/Time Value Range Status  11/17/2012  4:00 AM 77  <150 mg/dL Final   Prealbumin  Date/Time Value Range Status  11/10/2012  6:35 AM 13.5* 17.0 - 34.0 mg/dL Final  1/61/0960  4:54 AM 11.7* 17.0 - 34.0 mg/dL Final  0/98/1191  4:78 AM 7.7* 17.0 - 34.0 mg/dL Final     Scheduled Meds: . enoxaparin (LOVENOX) injection  45 mg Subcutaneous Q12H  . fentaNYL  75 mcg Transdermal Q72H  . HYDROmorphone PCA 0.3 mg/mL   Intravenous Q4H  . insulin aspart  0-15 Units Subcutaneous Custom  . pantoprazole (PROTONIX) IV  40 mg  Intravenous QHS  . piperacillin-tazobactam (ZOSYN)  IV  3.375 g Intravenous Q8H  . sodium chloride  10-40 mL Intracatheter Q12H    Continuous Infusions: . Marland KitchenTPN (CLINIMIX-E) Adult 50 mL/hr at 11/17/12 0900  . sodium chloride 40 mL/hr at 11/16/12 1611  . feeding supplement (VITAL AF 1.2 CAL) 1,000 mL (11/16/12 1700)    Jarold Motto MS, RD, LDN Pager: [phone] After-hours pager: [phone]

## 2012-11-17 NOTE — Progress Notes (Addendum)
Verbal/Phone Report given to "Robin,RN" on unit 3300. Patient to be transferred to room 3311.

## 2012-11-17 NOTE — Progress Notes (Signed)
Transferred to Room 3311 via wheelchair to bed.  Abdominal dressing clean/dry/intact.  All personal belongings sent with patient to room 3311 including cell phone x 2 and chargers; meds and chart given to Zella Ball, Charity fundraiser.  No acute distress noted.  Safety maintained.  VSS. Zella Ball, RN to resume care.

## 2012-11-17 NOTE — Progress Notes (Signed)
Physical Therapy Treatment Patient Details Name: MAKAYLIE DEDEAUX MRN: [id number] DOB: [birth year] Today's Date: 11/17/2012 Time: 4098-1191 PT Time Calculation (min): 30 min  PT Assessment / Plan / Recommendation  History of Present Illness 67 year old female with PMH of COPD and multiple abdominal surgeries who presents to the ICU post abdominal surgery due to 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     Clinical Impression Pt progressing with mobility able to complete circle around unit today but denied attempting stairs. Encouraged ambulation and mobility with nursing and will continue to follow to return pt to independent level. Pt able to perform own pericare today after BM.    PT Comments     Follow Up Recommendations        Does the patient have the potential to tolerate intense rehabilitation     Barriers to Discharge        Equipment Recommendations       Recommendations for Other Services    Frequency     Progress towards PT Goals Progress towards PT goals: Progressing toward goals  Plan Current plan remains appropriate    Precautions / Restrictions Precautions Precaution Comments: jp drain, peg tube   Pertinent Vitals/Pain 10/10 surgical pain, RN aware, PCA used, pt agreeable to mobility    Mobility  Bed Mobility Bed Mobility: Not assessed Details for Bed Mobility Assistance: in chair before and after Transfers Sit to Stand: 6: Modified independent (Device/Increase time);From chair/3-in-1;From toilet Stand to Sit: 6: Modified independent (Device/Increase time);To toilet;To chair/3-in-1 Ambulation/Gait Ambulation/Gait Assistance: 5: Supervision Ambulation Distance (Feet): 300 Feet Assistive device: None Ambulation/Gait Assistance Details: cueing to extend trunk and for breathing technique with assist for lines Gait Pattern: Step-through pattern;Decreased stride length Gait velocity: decreased    Exercises      PT Diagnosis:    PT Problem List:   PT Treatment Interventions:     PT Goals (current goals can now be found in the care plan section)    Visit Information  Last PT Received On: 11/17/12 Assistance Needed: +1 History of Present Illness: 67 year old female with PMH of COPD and multiple abdominal surgeries who presents to the ICU post abdominal surgery due to 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      Subjective Data      Cognition  Cognition Arousal/Alertness: Awake/alert Behavior During Therapy: WFL for tasks assessed/performed Overall Cognitive Status: Within Functional Limits for tasks assessed    Balance     End of Session PT - End of Session Activity Tolerance: Patient limited by pain Patient left: in chair;with call bell/phone within reach;with nursing/sitter in room Nurse Communication: Mobility status   GP     Toney Sang [facility] 11/17/2012, 8:34 AM Delaney Meigs, PT [phone]

## 2012-11-17 NOTE — Progress Notes (Signed)
Pt admitted from 2300, oriented to unit and routines, pain management and safety  Discussed, pt verbalizes understanding.  Pt called family from room to let them know of new room number

## 2012-11-17 NOTE — Progress Notes (Signed)
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  Patient Measurements: Height: 5' 4.96" (165 cm) Weight: 104 lb 15 oz (47.6 kg) IBW/kg (Calculated) : 56.91  Vital Signs: Temp: 98.6 F (37 C) (07/28 0945) Temp src: Oral (07/28 0945) BP: 135/73 mmHg (07/28 0945) Pulse Rate: 101 (07/28 0945) Intake/Output from previous day: 07/27 0701 - 07/28 0700 In: 1745.5 [P.O.:120; I.V.:525.5; NG/GT:420; IV Piggyback:300] Out: 2555 [Urine:2250; Emesis/NG output:300; Drains:5] Intake/Output from this shift: Total I/O In: 120 [I.V.:80; NG/GT:40] Out: 20 [Drains:20]  Labs:  Recent Labs  11/15/12 0345 11/16/12 0500 11/17/12 0400  WBC 22.3* 21.8* 21.7*  HGB 10.9* 10.8* 10.4*  HCT 30.5* 31.2* 31.7*  PLT 401* 420* 435*     Recent Labs  11/15/12 0345 11/16/12 0500 11/17/12 0400  NA 138 137 135  K 3.3* 3.7 4.2  CL 102 102 100  CO2 27 26 27   GLUCOSE 138* 109* 124*  BUN 20 19 19   CREATININE 0.59 0.52 0.49*  CALCIUM 8.9 8.7 8.4  MG  --   --  1.9  PHOS  --   --  3.8  PROT  --   --  5.8*  ALBUMIN  --   --  1.5*  AST  --   --  22  ALT  --   --  14  ALKPHOS  --   --  136*  BILITOT  --   --  0.6  TRIG  --   --  77   Estimated Creatinine Clearance: 52.7 ml/min (by C-G formula based on Cr of 0.49).    Recent Labs  11/17/12 0024 11/17/12 0625 11/17/12 0751  GLUCAP 159* 132* 136*    Insulin Requirements in the past 24 hours:  9 units SSI during TPN infusion + 0 units while off, on mod scale q4h   Assessment:  66 YOF with history of gastrocutaneous fistula on chronic TPN admitted with abdominal pain, and imaging showed small bowel dilatation concerning for partial SBO. Now, s/p exp lap 7/22 with LOA, SBR, wound vac and J-tube placement. Tolerating TNA at goal.  Pt with severe malnutrition in the context of chronic illness.  Noted TF -- vital 1.2 at 20 ml/hr started 7/26 via J-tube.   Per MD note  "advance TF to goal rate, begin weaning off TNA when TF at goal".  GI: hx GERD. Chronic TPN for gastrocutaneous fistula - LFTs WNL,   Prealbumin low at 13.5.  I changed IV PPI to per tube  Endo: no hx - CBGs controlled on and off TPN  Lytes: K+ improved at 4.2 after repletion with 3 K-runs yesterday, mag 1.9, phos 3.8 Renal: SCr 0.42 (stable), CrCL ~50 ml/min, no IVF while off TPN, excellent UOP   Pulm: hx COPD - now on 2L [place]  Cards: TG 77  Hepatobil: LFTs WNL   Neuro: hx depression / neuromuscular disorder / fibromyalgia - fentanyl patch, GCS 15  AC: Lovenox for hx PE (Xarelto PTA), hgb trending down, plts returned to normal, no bleeding   ID: Zosyn D#7 (7/21 >> ), empiric for leukocytosis + possible gastroenteritis, afebrile, WBC peaked 47.7, trended down to 21.7, LA 3 >> 2.7, UA unimpressive   Best Practices: full dose lovenox, home meds addressed  TPN Access: PICC line  TPN day#: continued from home   Current Nutrition:  Home cyclic TPN: 78 gm protein/d, 312 gm dextrose/d, 50 gm lipids MWF. Total kCal/d based on weekly average =  1659, meeting 100% goals  MVI MWF, Addamel 2.33mL daily, no insulin  12 hr cycle  Vital 1.2 at 20 ml/hr via jejunostomy tube  Nutritional Goals:  1500-1700 kCal, 80-95 grams of protein per day   Plan:  -  Advance Vital AF 1.2 by 10 ml q12h via j-tube to goal of 55 ml/hr. -dc TNA after today's bag to avoid overfeeding - Daily multivitamin per tube  - Zinc 220 mg and Vit C 500 mg per tube daily - dc SSI -  Change PPI to per tube Herby Abraham, Pharm.D. [phone] 11/17/2012 11:00 AM

## 2012-11-18 ENCOUNTER — Encounter (INDEPENDENT_AMBULATORY_CARE_PROVIDER_SITE_OTHER): Payer: Medicare Other | Admitting: Surgery

## 2012-11-18 LAB — CBC
Hemoglobin: 10.6 g/dL — ABNORMAL LOW (ref 12.0–15.0)
MCH: 25.3 pg — ABNORMAL LOW (ref 26.0–34.0)
MCV: 77.3 fL — ABNORMAL LOW (ref 78.0–100.0)
RBC: 4.19 MIL/uL (ref 3.87–5.11)

## 2012-11-18 MED ORDER — RIVAROXABAN 20 MG PO TABS
20.0000 mg | ORAL_TABLET | Freq: Every day | ORAL | Status: DC
  Administered 2012-11-18 – 2012-11-29 (×12): 20 mg via ORAL
  Filled 2012-11-18 (×14): qty 1

## 2012-11-18 NOTE — Progress Notes (Signed)
ANTICOAGULATION CONSULT NOTE - Follow Up Consult  Pharmacy Consult: Lovenox >> Xarelto Indication:  History of PE  Allergies  Allergen Reactions  . Avelox (Moxifloxacin Hcl In Nacl) Nausea And Vomiting  . Betadine (Povidone Iodine) Itching and Rash  . Alendronate Sodium Nausea And Vomiting  . Aspirin Nausea Only  . Codeine Nausea And Vomiting  . Doxycycline     Pt doesn't remember reaction  . Fluconazole     Pt doesn't remember reaction  . Hydrocodone Nausea And Vomiting    GI distress  . Neurontin (Gabapentin) Other (See Comments)    Mood changes   . Nsaids Other (See Comments)    Severe gastritis & perforation - avoid NSAIDs when possible  . Sertraline Hcl Other (See Comments)    Hallucinations   . Sulfa Antibiotics Rash    Patient Measurements: Height: 5' 4.96" (165 cm) Weight: 104 lb 15 oz (47.6 kg) IBW/kg (Calculated) : 56.91  Vital Signs: Temp: 98.6 F (37 C) (07/29 1200) Temp src: Oral (07/29 1200) BP: 111/63 mmHg (07/29 1200) Pulse Rate: 95 (07/29 1200)  Labs:  Recent Labs  11/16/12 0500 11/17/12 0400 11/18/12 0623  HGB 10.8* 10.4* 10.6*  HCT 31.2* 31.7* 32.4*  PLT 420* 435* 424*  CREATININE 0.52 0.49*  --     Estimated Creatinine Clearance: 52.7 ml/min (by C-G formula based on Cr of 0.49).      Assessment: 58 YOF with history of bilateral PE's to resume home Xarelto.  Noted Lovenox was given this AM around 0900.  Patient's renal function has been stable.  No bleeding reported.   Goal of Therapy:  Full anticoagulation Monitor platelets by anticoagulation protocol: Yes    Plan:  - Xarelto 20mg  PO daily with supper - Monitor renal fxn, CBC, s/sx of bleeding - F/U Zosyn LOT     Cherrise Occhipinti D. Laney Potash, PharmD, BCPS Pager:  [phone] 11/18/2012, 12:46 PM

## 2012-11-18 NOTE — Progress Notes (Signed)
7 Days Post-Op  Subjective:  Patient transferred to step-down.  Feeling better - less PCA use Multiple loose bowel movements yesterday, none today WBC up today, but clinically looks better. Remains afebrile Tube feeds at 50 ml/hr; goal rate 55 ml/hr   Objective: Vital signs in last 24 hours: Temp:  [98.2 F (36.8 C)-99.5 F (37.5 C)] 98.6 F (37 C) (07/29 1200) Pulse Rate:  [94-107] 95 (07/29 1200) Resp:  [12-22] 16 (07/29 1200) BP: (107-132)/(63-72) 111/63 mmHg (07/29 1200) SpO2:  [93 %-99 %] 98 % (07/29 1200) Last BM Date: 11/16/12  Intake/Output from previous day: 07/28 0701 - 07/29 0700 In: 1280 [I.V.:600; NG/GT:530; IV Piggyback:150] Out: 1465 [Urine:1050; Drains:415] Intake/Output this shift: Total I/O In: 430 [I.V.:200; NG/GT:230] Out: 90 [Drains:90]  General appearance: alert, cooperative and no distress Resp: clear to auscultation bilaterally Cardio: regular rate and rhythm, S1, S2 normal, no murmur, click, rub or gallop GI: soft, + BS; incisional tenderness Drain - minimal output Midline wound - much less drainage from gastrocutaneous fistula; wound beginning to granulate  Lab Results:   Recent Labs  11/17/12 0400 11/18/12 0623  WBC 21.7* 27.6*  HGB 10.4* 10.6*  HCT 31.7* 32.4*  PLT 435* 424*   BMET  Recent Labs  11/16/12 0500 11/17/12 0400  NA 137 135  K 3.7 4.2  CL 102 100  CO2 26 27  GLUCOSE 109* 124*  BUN 19 19  CREATININE 0.52 0.49*  CALCIUM 8.7 8.4   PT/INR No results found for this basename: LABPROT, INR,  in the last 72 hours ABG No results found for this basename: PHART, PCO2, PO2, HCO3,  in the last 72 hours  Studies/Results: No results found.  Anti-infectives: Anti-infectives   Start     Dose/Rate Route Frequency Ordered Stop   11/10/12 1000  piperacillin-tazobactam (ZOSYN) IVPB 3.375 g     3.375 g 12.5 mL/hr over 240 Minutes Intravenous 3 times per day 11/10/12 1610        Assessment/Plan: s/p  Procedure(s): EXPLORATORY LAPAROTOMY (N/A) SMALL BOWEL RESECTION (N/A) APPLICATION OF WOUND VAC (N/A) PLACEMENT OF FEEDING JEJUNOSTOMY TUBE (N/A) LYSIS OF ADHESION (N/A) Stop TNA Tube feeds at goal rate Xarelto via J-tube Continue antibiotics - if WBC continues to climb, will work-up with CT abd Ambulate Dressing changes q6   LOS: 9 days    Dilan Novosad K. 11/18/2012

## 2012-11-19 ENCOUNTER — Inpatient Hospital Stay (HOSPITAL_COMMUNITY): Payer: Medicare Other

## 2012-11-19 ENCOUNTER — Encounter (HOSPITAL_COMMUNITY): Payer: Self-pay | Admitting: *Deleted

## 2012-11-19 DIAGNOSIS — R079 Chest pain, unspecified: Secondary | ICD-10-CM

## 2012-11-19 LAB — TROPONIN I: Troponin I: <0.3 ng/mL (ref <0.30)

## 2012-11-19 LAB — CBC
HCT: 31.3 % — ABNORMAL LOW (ref 36.0–46.0)
Hemoglobin: 10.5 g/dL — ABNORMAL LOW (ref 12.0–15.0)
MCH: 26.1 pg (ref 26.0–34.0)
MCHC: 33.5 g/dL (ref 30.0–36.0)
MCV: 77.7 fL — ABNORMAL LOW (ref 78.0–100.0)

## 2012-11-19 MED ORDER — HYDROMORPHONE HCL PF 1 MG/ML IJ SOLN
1.0000 mg | Freq: Once | INTRAMUSCULAR | Status: AC
  Administered 2012-11-19: 1 mg via INTRAVENOUS

## 2012-11-19 MED ORDER — CHLORHEXIDINE GLUCONATE 4 % EX LIQD
CUTANEOUS | Status: AC
  Filled 2012-11-19: qty 30

## 2012-11-19 MED ORDER — IOHEXOL 300 MG/ML  SOLN
25.0000 mL | INTRAMUSCULAR | Status: AC
  Administered 2012-11-19 (×2): 25 mL via ORAL

## 2012-11-19 MED ORDER — IOHEXOL 300 MG/ML  SOLN
50.0000 mL | Freq: Once | INTRAMUSCULAR | Status: AC | PRN
  Administered 2012-11-19: 10 mL

## 2012-11-19 MED ORDER — IOHEXOL 350 MG/ML SOLN
100.0000 mL | Freq: Once | INTRAVENOUS | Status: AC | PRN
  Administered 2012-11-19: 100 mL via INTRAVENOUS

## 2012-11-19 MED ORDER — SODIUM CHLORIDE 0.9 % IV BOLUS (SEPSIS)
1000.0000 mL | Freq: Once | INTRAVENOUS | Status: AC
  Administered 2012-11-19: 1000 mL via INTRAVENOUS

## 2012-11-19 NOTE — Progress Notes (Signed)
PT Cancellation Note  Patient Details Name: Cynthia Bowen MRN: [id number] DOB: 10/04/1945   Cancelled Treatment:    Reason Eval/Treat Not Completed:  (Pt declined due to drinking drink for CT and feeling nausea)   INGOLD,Valorie Mcgrory 11/19/2012, 11:57 AM  [facility] [phone] (pager)

## 2012-11-19 NOTE — Progress Notes (Signed)
Since returning from CT, pt's heartrate has been 120s-130s.  BP now 123/74.  Pt states she feels better now than earlier this am.  Dr. [name] paged with above information.  Orders received. Will continue to monitor.  Roselie Awkward, RN

## 2012-11-19 NOTE — Progress Notes (Signed)
Pt having 10/10 chest pain to left chest, non-radiating, sudden and sharpe. EKG shows sinus tach, VVS. MD Ramerez made aware and new orders received. Will continue to monitor.

## 2012-11-19 NOTE — Progress Notes (Addendum)
NUTRITION FOLLOW UP  DOCUMENTATION CODES  Per approved criteria   -Severe malnutrition in the context of chronic illness  -Underweight    Intervention:    Continue Vital AF 1.2 at 55 ml/h to meet 100% of estimated nutrition needs.  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 Ongoing.  Goal:   Intake to meet > 90% of estimated nutrition needs; met.  Monitor:   Weight trend, labs,TF tolerance/adequacy, ability to begin PO diet.  Assessment:   Patient with hx of chronic gastrocutaneous fistula and malnutrition; on TPN at home; presented to ED with lower abdominal pain; abdominal X-ray concerning for partial small bowel obstruction.   S/P exploratory laparotomy with lysis of adhesions, small bowel resection, and application of wound VAC 7/22. Transitioned to enteral nutrition via j-tube 7/28. Tolerating TF well at goal rate to meet 100% of estimated nutrition needs. TPN has been discontinued. Much less drainage from gastrocutaneous fistula; wound beginning to granulate.   Patient c/o pain in abdomen and chest areas. She states that the abdominal pain is not new. She is going for a scan of abdomen/pelvis today to evaluate RUQ drain and R/O abscess.  Height: Ht Readings from Last 1 Encounters:  11/10/12 5' 4.96" (1.65 m)    Weight Status:   Wt Readings from Last 1 Encounters:  11/16/12 104 lb 15 oz (47.6 kg)  Admit wt 95 lb; wt increasing with positive fluid status.  Re-estimated needs:  Kcal: 1500 - 1700 Protein: 80 - 95 grams Fluid: at least 1.5 liters daily  Skin:  Bilateral abdominal incision  Diet Order: NPO  TF Order: Vital AF 1.2 at 55 ml/h providing 1584 kcals, 99 gm protein, 1071 ml free water daily.   Intake/Output Summary (Last 24 hours) at 11/19/12 0947 Last data filed at 11/19/12 0200  Gross per 24 hour  Intake   1515 ml  Output    175 ml  Net   1340 ml    Last BM:  7/29   Labs:   Recent Labs Lab 11/13/12 0400 11/14/12 0451 11/15/12 0345 11/16/12 0500 11/17/12 0400  NA 138 139 138 137 135  K 3.3* 3.3* 3.3* 3.7 4.2  CL 104 105 102 102 100  CO2 27 29 27 26 27   BUN 19 19 20 19 19   CREATININE 0.50 0.46* 0.59 0.52 0.49*  CALCIUM 8.3* 8.6 8.9 8.7 8.4  MG 2.2 2.0  --   --  1.9  PHOS 2.1* 2.7  --   --  3.8  GLUCOSE 187* 155* 138* 109* 124*    CBG (last 3)   Recent Labs  11/17/12 0625 11/17/12 0751 11/18/12 1138  GLUCAP 132* 136* 121*   Triglycerides  Date/Time Value Range Status  11/17/2012  4:00 AM 77  <150 mg/dL Final   Prealbumin  Date/Time Value Range Status  11/17/2012  4:00 AM 4.9* 17.0 - 34.0 mg/dL Final  9/60/4540  9:81 AM 13.5* 17.0 - 34.0 mg/dL Final  1/91/4782  9:56 AM 11.7* 17.0 - 34.0 mg/dL Final     Scheduled Meds: . ascorbic acid  500 mg Per Tube Daily  . fentaNYL  75 mcg Transdermal Q72H  . HYDROmorphone PCA 0.3 mg/mL   Intravenous Q4H  . multivitamin  5 mL Per Tube Daily  . pantoprazole sodium  40 mg Per Tube QHS  . piperacillin-tazobactam (ZOSYN)  IV  3.375 g Intravenous Q8H  . rivaroxaban  20 mg Oral Q supper  .  sodium chloride  10-40 mL Intracatheter Q12H  . zinc sulfate  220 mg Per Tube Daily    Continuous Infusions: . sodium chloride 40 mL/hr at 11/19/12 0642  . feeding supplement (VITAL AF 1.2 CAL) 1,000 mL (11/19/12 0312)    Joaquin Courts, RD, LDN, CNSC Pager [phone] After Hours Pager [phone]

## 2012-11-19 NOTE — Progress Notes (Signed)
8 Days Post-Op  Subjective: Patient had acute onset of L chest pain a few hours ago.  EKG/ Troponin negative Still with some pain with deep inspiration Two BM yesterday Feeling "bloated"  Objective: Vital signs in last 24 hours: Temp:  [98.1 F (36.7 C)-99.7 F (37.6 C)] 99.7 F (37.6 C) (07/30 0700) Pulse Rate:  [95-117] 103 (07/30 0317) Resp:  [15-26] 15 (07/30 0351) BP: (111-122)/(58-67) 119/61 mmHg (07/30 0317) SpO2:  [95 %-100 %] 100 % (07/30 0351) Last BM Date: 11/18/12  Intake/Output from previous day: 07/29 0701 - 07/30 0700 In: 1675 [I.V.:720; NG/GT:855; IV Piggyback:100] Out: 265 [Drains:265] Intake/Output this shift:    General appearance: alert, cooperative and no distress Resp: rhonchi bibasilar Cardio: regular rate and rhythm, S1, S2 normal, no murmur, click, rub or gallop GI: RUQ drain with surrounding erythema/ seems to be leaking around drain Wound - visible fistula at upper part of wound, decreasing drainage; remainder of wound granulating J-tube site look good Lab Results:   Recent Labs  11/18/12 0623 11/19/12 0455  WBC 27.6* 25.7*  HGB 10.6* 10.5*  HCT 32.4* 31.3*  PLT 424* 467*   BMET  Recent Labs  11/17/12 0400  NA 135  K 4.2  CL 100  CO2 27  GLUCOSE 124*  BUN 19  CREATININE 0.49*  CALCIUM 8.4   PT/INR No results found for this basename: LABPROT, INR,  in the last 72 hours ABG No results found for this basename: PHART, PCO2, PO2, HCO3,  in the last 72 hours  Studies/Results: No results found.  Anti-infectives: Anti-infectives   Start     Dose/Rate Route Frequency Ordered Stop   11/10/12 1000  piperacillin-tazobactam (ZOSYN) IVPB 3.375 g     3.375 g 12.5 mL/hr over 240 Minutes Intravenous 3 times per day 11/10/12 1610        Assessment/Plan: s/p Procedure(s): EXPLORATORY LAPAROTOMY (N/A) SMALL BOWEL RESECTION (N/A) APPLICATION OF WOUND VAC (N/A) PLACEMENT OF FEEDING JEJUNOSTOMY TUBE (N/A) LYSIS OF ADHESION  (N/A) Acute onset of Left chest pain/ pain with deep inspiration CT angio chest to rule out recurrent PE's, even on anticoagulation CT Abd/ Pelvis to evaluate RUQ drain/ rule out abscess.  Drain does not seem to be functioning well with a large amount of leaking around the drain site Persistent WBC concerning for abscess Prefer to have Xarelto in feeding tube, rather than PO, since the gastrocutaneous fistula may keep the patient from receiving the full benefit of the medication Ambulate with assistance.  LOS: 10 days    Phoua Hoadley K. 11/19/2012

## 2012-11-19 NOTE — Procedures (Signed)
Successful exchange and upsize of the RUQ DRAIN DRAIN SITE INJECTION CONFIRMS RESIDUAL FISTULA TO THE [facility] AND ALSO THE MIDLINE OOPEN SURGICAL WOULD Drain connect to gravity bag instead of suction bulb  Full report in pacs No comp

## 2012-11-20 ENCOUNTER — Inpatient Hospital Stay (HOSPITAL_COMMUNITY): Payer: Medicare Other

## 2012-11-20 ENCOUNTER — Encounter (HOSPITAL_COMMUNITY): Payer: Self-pay | Admitting: Radiology

## 2012-11-20 LAB — CBC
HCT: 29.2 % — ABNORMAL LOW (ref 36.0–46.0)
Hemoglobin: 9.9 g/dL — ABNORMAL LOW (ref 12.0–15.0)
MCH: 26.1 pg (ref 26.0–34.0)
RBC: 3.79 MIL/uL — ABNORMAL LOW (ref 3.87–5.11)

## 2012-11-20 LAB — PROTIME-INR
INR: 1.14 (ref 0.00–1.49)
Prothrombin Time: 14.4 seconds (ref 11.6–15.2)

## 2012-11-20 MED ORDER — FENTANYL CITRATE 0.05 MG/ML IJ SOLN
INTRAMUSCULAR | Status: AC
  Filled 2012-11-20: qty 4

## 2012-11-20 MED ORDER — MIDAZOLAM HCL 2 MG/2ML IJ SOLN
INTRAMUSCULAR | Status: AC
  Filled 2012-11-20: qty 4

## 2012-11-20 MED ORDER — MIDAZOLAM HCL 2 MG/2ML IJ SOLN
INTRAMUSCULAR | Status: AC | PRN
  Administered 2012-11-20: 1 mg via INTRAVENOUS

## 2012-11-20 MED ORDER — FENTANYL CITRATE 0.05 MG/ML IJ SOLN
INTRAMUSCULAR | Status: AC | PRN
  Administered 2012-11-20: 50 ug via INTRAVENOUS

## 2012-11-20 MED ORDER — SODIUM CHLORIDE 0.9 % IV SOLN
1.0000 g | INTRAVENOUS | Status: AC
  Administered 2012-11-20 – 2012-11-29 (×10): 1 g via INTRAVENOUS
  Filled 2012-11-20 (×10): qty 1

## 2012-11-20 NOTE — H&P (Signed)
Agree with PA note.  Signed,  Clayton Bosserman K. Ilham Roughton, MD [facility]  

## 2012-11-20 NOTE — Progress Notes (Signed)
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     PT Comments   Pt admitted with  abdominal surgery. Pt currently with functional limitations due to continued pain and immobility.  Pt somewhat self limiting this week.  Encourage mobility.  Pt will benefit from skilled PT to increase their independence and safety with mobility to allow discharge to the venue listed below.   Follow Up Recommendations  Home health PT;Supervision/Assistance - 24 hour                 Equipment Recommendations  None recommended by PT        Frequency Min 3X/week   Progress towards PT Goals Progress towards PT goals: Progressing toward goals  Plan Discharge plan needs to be updated    Precautions / Restrictions Precautions Precautions: Fall Precaution Comments: jp drain, peg tube Restrictions Weight Bearing Restrictions: No   Pertinent Vitals/Pain VSS, some pain - has PCA    Mobility  Bed Mobility Bed Mobility: Rolling Left;Left Sidelying to Sit Rolling Left: 7: Independent Left Sidelying to Sit: 7: Independent Transfers Transfers: Sit to Stand;Stand to Sit Sit to Stand: 5: Supervision;With upper extremity assist;From bed Stand to Sit: 5: Supervision;With upper extremity assist;With armrests;To chair/3-in-1 Details for Transfer Assistance: guarding for safety and lines Ambulation/Gait Ambulation/Gait Assistance: 5: Supervision Ambulation Distance (Feet): 5 Feet Assistive device: None Ambulation/Gait Assistance Details: continues to maintain flexed posture and would only ambulatte to  chair. Gait Pattern: Step-through pattern;Decreased stride length Gait velocity: decreased Stairs: No Wheelchair Mobility Wheelchair Mobility: No    Exercises General Exercises - Lower Extremity Ankle Circles/Pumps: AROM;Both;10 reps;Supine Long Arc Quad: AROM;Both;10 reps;Seated Heel Slides: AROM;Both;10 reps;Seated Hip Flexion/Marching: AROM;Both;10 reps;Seated    PT Goals (current goals can now be found in the care plan section)    Visit Information  Last PT Received On: 11/20/12 Assistance Needed: +1 History of Present Illness: 67 year old female with PMH of COPD and multiple abdominal surgeries who presents to the ICU post abdominal surgery due to 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      Subjective Data  Subjective: "I just can't go in the hall but I will get up."   Cognition  Cognition Arousal/Alertness: Awake/alert Behavior During Therapy: WFL for tasks assessed/performed Overall Cognitive Status: Within Functional Limits for tasks assessed    Balance  Balance Balance Assessed: Yes Static Standing Balance Static Standing - Balance Support: Bilateral upper extremity supported;During functional activity Static Standing - Level of Assistance: 5: Stand by assistance Static Standing - Comment/# of Minutes: 2  End of Session PT - End of Session Equipment Utilized During Treatment: Gait belt;Oxygen Activity Tolerance: Patient limited by pain;Patient limited by fatigue Patient left: in chair;with call bell/phone within reach;with nursing/sitter in room Nurse Communication: Mobility status        INGOLD,Priscella Donna 11/20/2012, 1:44 PM  [facility] [phone] (pager)

## 2012-11-20 NOTE — Progress Notes (Signed)
9 Days Post-Op  Subjective: Overall, patient feeling better Had some nausea yesterday when drinking PO contrast CT showed several fluid collections in abdomen - two largest are in LUQ Will attempt CT-guided aspiration today for culture WBC up today Remains afebrile Tachycardia improved this morning  Objective: Vital signs in last 24 hours: Temp:  [98.4 F (36.9 C)-100.2 F (37.9 C)] 98.7 F (37.1 C) (07/31 0345) Pulse Rate:  [108-120] 110 (07/31 0345) Resp:  [15-28] 19 (07/31 0345) BP: (99-131)/(53-86) 121/63 mmHg (07/31 0345) SpO2:  [93 %-100 %] 96 % (07/31 0345) Last BM Date: 11/19/12  Intake/Output from previous day: 07/30 0701 - 07/31 0700 In: 2305 [I.V.:940; NG/GT:1265; IV Piggyback:100] Out: 620 [Urine:450; Drains:170] Intake/Output this shift:    General appearance: alert, cooperative and no distress Resp: clear to auscultation bilaterally Cardio: regular rate and rhythm, S1, S2 normal, no murmur, click, rub or gallop GI: LUQ drain - cloudy greenish drainage Midline wound - less output from upper part of wound; entire wound is beginning to granulate  Lab Results:   Recent Labs  11/19/12 0455 11/20/12 0410  WBC 25.7* 34.0*  HGB 10.5* 9.9*  HCT 31.3* 29.2*  PLT 467* 501*   BMET No results found for this basename: NA, K, CL, CO2, GLUCOSE, BUN, CREATININE, CALCIUM,  in the last 72 hours PT/INR No results found for this basename: LABPROT, INR,  in the last 72 hours ABG No results found for this basename: PHART, PCO2, PO2, HCO3,  in the last 72 hours  Studies/Results: Ct Angio Chest Pe W/cm &/or Wo Cm  11/19/2012   *RADIOLOGY REPORT*  Clinical Data: Chest pain  CT ANGIOGRAPHY CHEST  Technique:  Multidetector CT imaging of the chest using the standard protocol during bolus administration of intravenous contrast. Multiplanar reconstructed images including MIPs were obtained and reviewed to evaluate the vascular anatomy.  Contrast: OMNIPAQUE IOHEXOL 350  MG/ML SOLN total as contrast had to be real administered due to bolus timing.  Comparison: CT scan of August 05, 2012.  Findings: Mild left pleural effusion is noted with adjacent subsegmental atelectasis.  Minimal subsegmental atelectasis is noted posteriorly in the right lung base. Atherosclerotic calcifications are seen involving the thoracic aorta.  No dissection is noted. 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 No definite evidence of new thrombus is noted.  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  No definite evidence of new thrombus is noted.  Mild left pleural effusion is noted with adjacent subsegmental atelectasis.   Original Report Authenticated By: Lupita Raider.,  M.D.   Ct Abdomen Pelvis W Contrast  11/19/2012   *RADIOLOGY REPORT*  Clinical Data: Gastrocutaneous fistula.  Recent exploratory laparotomy.  Persistent elevated white count.  CT ABDOMEN AND PELVIS WITH CONTRAST  Technique:  Multidetector CT imaging of the abdomen and pelvis was performed following the standard protocol during bolus administration of intravenous contrast.  Contrast: OMNIPAQUE IOHEXOL 350 MG/ML SOLN  Comparison: 11/10/2012  Findings: Open midline abdominal incision.  There is oral contrast noted within the incision and anterior abdominal wall soft tissues. Cannot exclude persistent gastrocutaneous fistula.  Multiple fluid collections throughout the abdomen and pelvis.  The largest is left subdiaphragmatic measuring 5.6 x 3.9 cm on image  19.  There is fluid collection containing gas along the left liver margin anterior to the stomach measuring 5.2 x 1.8 cm.  Within the upper pelvis to the right of midline, there is a  3.7 x 2.1 cm fluid collection on image 57.  Superior to this collection is a 3.3 cm fluid collection. Multiloculated fluid collections adjacent to the transverse colon posterior to the suture lines.  There is a jejunostomy catheter in place.  Small amount of free fluid in the pelvis.  2.4 cm cyst within the right ovary.  No left adnexal mass. Prior hysterectomy.  Liver, spleen, pancreas, adrenals are unremarkable.  Multiple cysts in the kidneys bilaterally.  Aorta and iliac vessels are heavily calcified, non-aneurysmal.  IMPRESSION: Numerous abdominal and pelvic fluid collections, most likely abscesses.  Open midline incision.  There is oral contrast within the incision and the anterior soft tissues deep to the incision.  Cannot exclude persistent gastrocutaneous fistula.   Original Report Authenticated By: Charlett Nose, M.D.   Ir Sinus/fist Tube Chk-non Gi  11/19/2012   *RADIOLOGY REPORT*  Clinical Data: Gastrocutaneous fistula, right upper quadrant percutaneous drain, drainage around the skin site  CONTRAST INJECTION OF THE EXISTING DRAIN CATHETER EXCHANGE AND UPSIZE OF THE RIGHT UPPER QUADRANT FISTULA DRAIN  Date:  11/19/2012 13:45:00  Radiologist:  Judie Petit. Ruel Favors, M.D.  Medications:  1% lidocaine locally  Guidance:  Fluoroscopic  Fluoroscopy time:  1 minute 6 seconds  Sedation time:  None.  Contrast volume:  10 ml Omnipaque-300  Complications:  No immediate  PROCEDURE/FINDINGS:  Informed consent was obtained from the patient following explanation of the procedure, risks, benefits and alternatives. The patient understands, agrees and consents for the procedure. All questions were addressed.  A time out was performed.  Maximal barrier sterile technique utilized including caps, mask, sterile gowns, sterile gloves, large sterile drape, hand hygiene, and Chloroprep  Under sterile conditions the existing right upper quadrant pigtail drain was injected.  Contrast immediately drains into the stomach compatible with  patent gastrocutaneous fistula.  Contrast also drains into the midline abdominal wound.  Drain catheter exchange and upsize:  Over Amplatz guide wire, this catheter was cut and removed.  A 12-French catheter was advanced with the retention loop formed in the small residual collection in the right upper quadrant.  Contrast injection reconfirms position. Catheter secured with a Prolene suture.  Suction bulb exchanged for a gravity drainage bag in attempt to promote fistula healing.  IMPRESSION: Exchange and upsize of the right upper quadrant fistula drain.  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   Original Report Authenticated By: Judie Petit. Miles Costain, M.D.   Ir Catheter Tube Change  11/19/2012   *RADIOLOGY REPORT*  Clinical Data: Gastrocutaneous fistula, right upper quadrant percutaneous drain, drainage around the skin site  CONTRAST INJECTION OF THE EXISTING DRAIN CATHETER EXCHANGE AND UPSIZE OF THE RIGHT UPPER QUADRANT FISTULA DRAIN  Date:  11/19/2012 13:45:00  Radiologist:  Judie Petit. Ruel Favors, M.D.  Medications:  1% lidocaine locally  Guidance:  Fluoroscopic  Fluoroscopy time:  1 minute 6 seconds  Sedation time:  None.  Contrast volume:  10 ml Omnipaque-300  Complications:  No immediate  PROCEDURE/FINDINGS:  Informed consent was obtained from the patient following explanation of the procedure, risks, benefits and alternatives. The patient understands, agrees and consents for the procedure. All questions were addressed.  A time out was performed.  Maximal barrier sterile technique utilized including caps, mask, sterile gowns, sterile gloves, large sterile drape,  hand hygiene, and Chloroprep  Under sterile conditions the existing right upper quadrant pigtail drain was injected.  Contrast immediately drains into the stomach compatible with patent gastrocutaneous fistula.  Contrast also drains into the midline abdominal wound.  Drain catheter exchange and upsize:  Over Amplatz guide wire, this  catheter was cut and removed.  A 12-French catheter was advanced with the retention loop formed in the small residual collection in the right upper quadrant.  Contrast injection reconfirms position. Catheter secured with a Prolene suture.  Suction bulb exchanged for a gravity drainage bag in attempt to promote fistula healing.  IMPRESSION: Exchange and upsize of the right upper quadrant fistula drain.  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   Original Report Authenticated By: Judie Petit. Miles Costain, M.D.    Anti-infectives: Anti-infectives   Start     Dose/Rate Route Frequency Ordered Stop   11/20/12 0715  ertapenem (INVANZ) 1 g in sodium chloride 0.9 % 50 mL IVPB     1 g 100 mL/hr over 30 Minutes Intravenous Every 24 hours 11/20/12 0709     11/10/12 1000  piperacillin-tazobactam (ZOSYN) IVPB 3.375 g  Status:  Discontinued     3.375 g 12.5 mL/hr over 240 Minutes Intravenous 3 times per day 11/10/12 0937 11/20/12 0709      Assessment/Plan: s/p Procedure(s): EXPLORATORY LAPAROTOMY (N/A) SMALL BOWEL RESECTION (N/A) APPLICATION OF WOUND VAC (N/A) PLACEMENT OF FEEDING JEJUNOSTOMY TUBE (N/A) LYSIS OF ADHESION (N/A) WBC high - will aspirate fluid collections for culture; stop Zosyn - change to Invanz Tube feeds at goal rate Off of TNA Continue dressing changes  LOS: 11 days    Rj Pedrosa K. 11/20/2012

## 2012-11-20 NOTE — Procedures (Signed)
Interventional Radiology Procedure Note  Procedure: CT aspiration of largest LUQ loculated fluid collection.  25 mL mildly turbid serosanguinous fluid and fibrinous debris aspirated.  No frank purulence.  No drain left in place.  Fluid sent for Cx Complications: None Recommendations: - Follow Cx - If positive and collection not responsive to IV abx, a drain could be placed  Signed,  Sterling Big, MD Vascular & Interventional Radiologist [facility]

## 2012-11-20 NOTE — H&P (Signed)
Cynthia Bowen is an 67 y.o. female.   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 COPD (chronic obstructive pulmonary disease)   . Pneumonia 12-2011  . GERD (gastroesophageal reflux disease)   . Headache(784.0)   . Arthritis     osteoarthritis  . Allergy   . Depression   . Neuromuscular disorder   . Osteoporosis   . Bronchitis     CURRENTLY AS OF 06/30/12 - HAS COUGH AND FINISHED ANTIBIOTIC FOR BRONCHITIS  . Fibromyalgia   . Pain     ABDOMINAL PAIN AND NAUSEA  . Pain     SOMETIMES PAIN RIGHT EAR AND NECK--STATES CAUSED BY A "LUMP" ON BACK OF EAR--USES KENALOG CREAM TOPICALLY AS NEEDED.  Marland Kitchen Gastrocutaneous fistula     Past Surgical History  Procedure Laterality Date  . Abdominal hysterectomy    . Esophagogastroduodenoscopy  04/18/2012    Procedure: ESOPHAGOGASTRODUODENOSCOPY (EGD);  Surgeon: [name], MD;  Location: [facility];  Service: Endoscopy;  Laterality: N/A;  . Eus  05/29/2012    Procedure: UPPER ENDOSCOPIC ULTRASOUND (EUS) LINEAR;  Surgeon: [name], MD;  Location: [facility];  Service: Endoscopy;  Laterality: N/A;  . Appendectomy    . Spine surgery      CERVICAL SPINE SURGERY X 2 - INCLUDING FUSION; LUMBAR SURGERY FOR RUPTURED DISC  . Eye surgery      BILATERAL CATARACT EXTRACTIONS  . Laparoscopic abdominal exploration N/A 07/02/2012    Procedure: converted to laparotomy with gastric biopsy;  Surgeon: [name]. [name], MD;  Location: [facility];  Service: General;  Laterality: N/A;  Laparoscopic Gastric Biospy attempted.   . Laparotomy N/A 07/07/2012    Procedure: EXPLORATORY LAPAROTOMY repair of gastric perforation with omental graham patch, drainage of abdominal abcess;  Surgeon:  [name]. [name], MD;  Location: [facility];  Service: General;  Laterality: N/A;  . Stomach surgery  07/07/2012    Omental patch of gastric perforation  . Laparotomy N/A 07/13/2012    Procedure: EXPLORATORY LAPAROTOMY repair gastric leak;  Surgeon: Mariella Saa, MD;  Location: [facility];  Service: General;  Laterality: N/A;  . Laparotomy N/A 11/11/2012    Procedure: EXPLORATORY LAPAROTOMY;  Surgeon: [name]. [name], MD;  Location: [facility];  Service: General;  Laterality: N/A;  . Bowel resection N/A 11/11/2012    Procedure: SMALL BOWEL RESECTION;  Surgeon: [name]. [name], MD;  Location: [facility];  Service: General;  Laterality: N/A;  . Minor application of wound vac N/A 11/11/2012    Procedure: APPLICATION OF WOUND VAC;  Surgeon: [name]. [name], MD;  Location: [facility];  Service: General;  Laterality: N/A;  . Jejunostomy N/A 11/11/2012    Procedure: PLACEMENT OF FEEDING JEJUNOSTOMY TUBE;  Surgeon: [name]. [name], MD;  Location: [facility];  Service: General;  Laterality: N/A;  . Lysis of adhesion N/A 11/11/2012    Procedure: LYSIS OF ADHESION;  Surgeon: [name]. [name], MD;  Location: [facility];  Service: General;  Laterality: N/A;    Family History  Problem Relation Age of Onset  . COPD Mother   . Hypertension Maternal Grandmother    Social History:  reports that she quit smoking about 7 years ago. Her smoking use included Cigarettes. She started smoking  about 9 years ago. She has a 17.5 pack-year smoking history. She has never used smokeless tobacco. She reports that she does not drink alcohol or use illicit drugs.  Allergies:  Allergies  Allergen Reactions  . Avelox (Moxifloxacin Hcl In Nacl) Nausea And Vomiting  . Betadine (Povidone Iodine) Itching and Rash  . Alendronate Sodium Nausea And Vomiting  . Aspirin Nausea Only  . Codeine Nausea And Vomiting  . Doxycycline     Pt doesn't remember reaction  . Fluconazole     Pt doesn't remember reaction  . Hydrocodone Nausea And Vomiting    GI  distress  . Neurontin (Gabapentin) Other (See Comments)    Mood changes   . Nsaids Other (See Comments)    Severe gastritis & perforation - avoid NSAIDs when possible  . Sertraline Hcl Other (See Comments)    Hallucinations   . Sulfa Antibiotics Rash    Medications Prior to Admission  Medication Sig Dispense Refill  . albuterol (PROVENTIL HFA;VENTOLIN HFA) 108 (90 BASE) MCG/ACT inhaler Inhale 2 puffs into the lungs every 4 (four) hours as needed for wheezing or shortness of breath.      . diphenhydrAMINE (BENADRYL) 12.5 MG/5ML liquid Take 12.5 mg by mouth 4 (four) times daily.       Marland Kitchen EPINEPHrine (EPIPEN) 0.3 mg/0.3 mL SOAJ Inject 0.3 mg into the muscle daily as needed (anaphylaxis).      . fentaNYL (DURAGESIC - DOSED MCG/HR) 50 MCG/HR Place 1 patch (50 mcg total) onto the skin every 3 (three) days.  15 patch  0  . fluconazole (DIFLUCAN) 40 MG/ML suspension Take 200 mg by mouth every 7 (seven) days. 4 week course - takes on Saturdays      . FLUoxetine (PROZAC) 20 MG/5ML solution Take 5 mLs (20 mg total) by mouth daily.  120 mL  3  . nystatin-triamcinolone ointment (MYCOLOG) Apply topically 2 (two) times daily.  30 g  0  . ondansetron (ZOFRAN-ODT) 4 MG disintegrating tablet Take 4 mg by mouth 2 (two) times daily as needed for nausea. Takes with percocet      . oxyCODONE-acetaminophen (PERCOCET/ROXICET) 5-325 MG per tablet Take 1 tablet by mouth every 4 (four) hours as needed for pain.  40 tablet  0  . Rivaroxaban (XARELTO) 20 MG TABS Take 20 mg by mouth daily with supper.        Results for orders placed during the hospital encounter of 11/09/12 (from the past 48 hour(s))  GLUCOSE, CAPILLARY     Status: Abnormal   Collection Time    11/18/12 11:38 AM      Result Value Range   Glucose-Capillary 121 (*) 70 - 99 mg/dL   Comment 1 Documented in Chart     Comment 2 Notify RN    CBC     Status: Abnormal   Collection Time    11/19/12  4:55 AM      Result Value Range   WBC 25.7 (*) 4.0 -  10.5 K/uL   RBC 4.03  3.87 - 5.11 MIL/uL   Hemoglobin 10.5 (*) 12.0 - 15.0 g/dL   HCT 16.1 (*) 09.6 - 04.5 %   MCV 77.7 (*) 78.0 - 100.0 fL   MCH 26.1  26.0 - 34.0 pg   MCHC 33.5  30.0 - 36.0 g/dL   RDW 40.9 (*) 81.1 - 91.4 %   Platelets 467 (*) 150 - 400 K/uL  TROPONIN I     Status: None   Collection Time  11/19/12  4:55 AM      Result Value Range   Troponin I <0.30  <0.30 ng/mL   Comment:            Due to the release kinetics of cTnI,     a negative result within the first hours     of the onset of symptoms does not rule out     myocardial infarction with certainty.     If myocardial infarction is still suspected,     repeat the test at appropriate intervals.  TROPONIN I     Status: None   Collection Time    11/19/12 10:05 AM      Result Value Range   Troponin I <0.30  <0.30 ng/mL   Comment:            Due to the release kinetics of cTnI,     a negative result within the first hours     of the onset of symptoms does not rule out     myocardial infarction with certainty.     If myocardial infarction is still suspected,     repeat the test at appropriate intervals.  TROPONIN I     Status: None   Collection Time    11/19/12  4:20 PM      Result Value Range   Troponin I <0.30  <0.30 ng/mL   Comment:            Due to the release kinetics of cTnI,     a negative result within the first hours     of the onset of symptoms does not rule out     myocardial infarction with certainty.     If myocardial infarction is still suspected,     repeat the test at appropriate intervals.  CBC     Status: Abnormal   Collection Time    11/20/12  4:10 AM      Result Value Range   WBC 34.0 (*) 4.0 - 10.5 K/uL   RBC 3.79 (*) 3.87 - 5.11 MIL/uL   Hemoglobin 9.9 (*) 12.0 - 15.0 g/dL   HCT 16.1 (*) 09.6 - 04.5 %   MCV 77.0 (*) 78.0 - 100.0 fL   MCH 26.1  26.0 - 34.0 pg   MCHC 33.9  30.0 - 36.0 g/dL   RDW 40.9 (*) 81.1 - 91.4 %   Platelets 501 (*) 150 - 400 K/uL   Ct Angio Chest Pe  W/cm &/or Wo Cm  11/19/2012   *RADIOLOGY REPORT*  Clinical Data: Chest pain  CT ANGIOGRAPHY CHEST  Technique:  Multidetector CT imaging of the chest using the standard protocol during bolus administration of intravenous contrast. Multiplanar reconstructed images including MIPs were obtained and reviewed to evaluate the vascular anatomy.  Contrast: OMNIPAQUE IOHEXOL 350 MG/ML SOLN total as contrast had to be real administered due to bolus timing.  Comparison: CT scan of August 05, 2012.  Findings: Mild left pleural effusion is noted with adjacent subsegmental atelectasis.  Minimal subsegmental atelectasis is noted posteriorly in the right lung base. Atherosclerotic calcifications are seen involving the thoracic aorta.  No dissection is noted. 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 No definite evidence of new thrombus is noted.  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  No definite evidence of new thrombus is noted.  Mild left pleural effusion is noted with adjacent subsegmental atelectasis.   Original Report Authenticated By: Lupita Raider.,  M.D.   Ct Abdomen Pelvis W Contrast  11/19/2012   *RADIOLOGY REPORT*  Clinical Data: Gastrocutaneous fistula.  Recent exploratory laparotomy.  Persistent elevated white count.  CT ABDOMEN AND PELVIS WITH CONTRAST  Technique:  Multidetector CT imaging of the abdomen and pelvis was performed following the standard protocol during bolus administration of intravenous contrast.  Contrast: OMNIPAQUE IOHEXOL 350 MG/ML SOLN  Comparison: 11/10/2012  Findings: Open midline abdominal incision.  There is oral contrast noted within the incision and anterior abdominal wall  soft tissues. Cannot exclude persistent gastrocutaneous fistula.  Multiple fluid collections throughout the abdomen and pelvis.  The largest is left subdiaphragmatic measuring 5.6 x 3.9 cm on image 19.  There is fluid collection containing gas along the left liver margin anterior to the stomach measuring 5.2 x 1.8 cm.  Within the upper pelvis to the right of midline, there is a 3.7 x 2.1 cm fluid collection on image 57.  Superior to this collection is a 3.3 cm fluid collection. Multiloculated fluid collections adjacent to the transverse colon posterior to the suture lines.  There is a jejunostomy catheter in place.  Small amount of free fluid in the pelvis.  2.4 cm cyst within the right ovary.  No left adnexal mass. Prior hysterectomy.  Liver, spleen, pancreas, adrenals are unremarkable.  Multiple cysts in the kidneys bilaterally.  Aorta and iliac vessels are heavily calcified, non-aneurysmal.  IMPRESSION: Numerous abdominal and pelvic fluid collections, most likely abscesses.  Open midline incision.  There is oral contrast within the incision and the anterior soft tissues deep to the incision.  Cannot exclude persistent gastrocutaneous fistula.   Original Report Authenticated By: Charlett Nose, M.D.   Ir Sinus/fist Tube Chk-non Gi  11/19/2012   *RADIOLOGY REPORT*  Clinical Data: Gastrocutaneous fistula, right upper quadrant percutaneous drain, drainage around the skin site  CONTRAST INJECTION OF THE EXISTING DRAIN CATHETER EXCHANGE AND UPSIZE OF THE RIGHT UPPER QUADRANT FISTULA DRAIN  Date:  11/19/2012 13:45:00  Radiologist:  Judie Petit. Ruel Favors, M.D.  Medications:  1% lidocaine locally  Guidance:  Fluoroscopic  Fluoroscopy time:  1 minute 6 seconds  Sedation time:  None.  Contrast volume:  10 ml Omnipaque-300  Complications:  No immediate  PROCEDURE/FINDINGS:  Informed consent was obtained from the patient following explanation of the procedure, risks, benefits and alternatives. The patient understands, agrees and  consents for the procedure. All questions were addressed.  A time out was performed.  Maximal barrier sterile technique utilized including caps, mask, sterile gowns, sterile gloves, large sterile drape, hand hygiene, and Chloroprep  Under sterile conditions the existing right upper quadrant pigtail drain was injected.  Contrast immediately drains into the stomach compatible with patent gastrocutaneous fistula.  Contrast also drains into the midline abdominal wound.  Drain catheter exchange and upsize:  Over Amplatz guide wire, this catheter was cut and removed.  A 12-French catheter was advanced with the retention loop formed in the small residual collection in the right upper quadrant.  Contrast injection reconfirms position. Catheter secured with a Prolene suture.  Suction bulb exchanged for a gravity drainage bag in attempt to promote fistula healing.  IMPRESSION: Exchange and upsize of the right upper quadrant fistula drain.  Residual patent fistula to the stomach and  midline open wound visualized.   Original Report Authenticated By: Judie Petit. Miles Costain, M.D.   Ir Catheter Tube Change  11/19/2012   *RADIOLOGY REPORT*  Clinical Data: Gastrocutaneous fistula, right upper quadrant percutaneous drain, drainage around the skin site  CONTRAST INJECTION OF THE EXISTING DRAIN CATHETER EXCHANGE AND UPSIZE OF THE RIGHT UPPER QUADRANT FISTULA DRAIN  Date:  11/19/2012 13:45:00  Radiologist:  Judie Petit. Ruel Favors, M.D.  Medications:  1% lidocaine locally  Guidance:  Fluoroscopic  Fluoroscopy time:  1 minute 6 seconds  Sedation time:  None.  Contrast volume:  10 ml Omnipaque-300  Complications:  No immediate  PROCEDURE/FINDINGS:  Informed consent was obtained from the patient following explanation of the procedure, risks, benefits and alternatives. The patient understands, agrees and consents for the procedure. All questions were addressed.  A time out was performed.  Maximal barrier sterile technique utilized including caps, mask, sterile  gowns, sterile gloves, large sterile drape, hand hygiene, and Chloroprep  Under sterile conditions the existing right upper quadrant pigtail drain was injected.  Contrast immediately drains into the stomach compatible with patent gastrocutaneous fistula.  Contrast also drains into the midline abdominal wound.  Drain catheter exchange and upsize:  Over Amplatz guide wire, this catheter was cut and removed.  A 12-French catheter was advanced with the retention loop formed in the small residual collection in the right upper quadrant.  Contrast injection reconfirms position. Catheter secured with a Prolene suture.  Suction bulb exchanged for a gravity drainage bag in attempt to promote fistula healing.  IMPRESSION: Exchange and upsize of the right upper quadrant fistula drain.  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   Original Report Authenticated By: Judie Petit. Miles Costain, M.D.    Review of Systems  Constitutional: Positive for fever and weight loss.  Respiratory: Negative for shortness of breath.   Cardiovascular: Negative for chest pain.  Gastrointestinal: Positive for nausea and abdominal pain.  Neurological: Positive for weakness.    Blood pressure 120/63, pulse 110, temperature 98.7 F (37.1 C), temperature source Oral, resp. rate 20, height 5' 4.96" (1.65 m), weight 104 lb 15 oz (47.6 kg), SpO2 97.00%. Physical Exam  Constitutional: She is oriented to person, place, and time.  frail  Cardiovascular: Normal rate, regular rhythm and normal heart sounds.   Respiratory: Effort normal and breath sounds normal. She has no wheezes.  GI: Soft. There is tenderness.  Musculoskeletal: Normal range of motion.  Neurological: She is alert and oriented to person, place, and time.  Skin: Skin is dry.  Psychiatric: She has a normal mood and affect. Her behavior is normal. Judgment and thought content normal.     Assessment/Plan SB resection 7/22- Dr Gwinda Passe Existing abd abscess drain  RUQ- gastrocutaneous fistula New LUQ abscesses Scheduled for aspiration - drain placement Pt aware of procedure benefits and risks and agreeable to proceed Consent signed and in chart  Toula Miyasaki A 11/20/2012, 10:15 AM

## 2012-11-21 LAB — CBC
HCT: 28.8 % — ABNORMAL LOW (ref 36.0–46.0)
MCHC: 33.3 g/dL (ref 30.0–36.0)
MCV: 77.8 fL — ABNORMAL LOW (ref 78.0–100.0)
Platelets: 544 10*3/uL — ABNORMAL HIGH (ref 150–400)
RDW: 21.3 % — ABNORMAL HIGH (ref 11.5–15.5)
WBC: 26.3 10*3/uL — ABNORMAL HIGH (ref 4.0–10.5)

## 2012-11-21 NOTE — Progress Notes (Signed)
10 Days Post-Op  Subjective: Patient feels better after aspiration of an intra-abdominal fluid collection yesterday.   WBC improved Still having bowel movements  At goal rate on tube feeds   Objective: Vital signs in last 24 hours: Temp:  [98.5 F (36.9 C)-100 F (37.8 C)] 98.5 F (36.9 C) (08/01 0400) Pulse Rate:  [101-117] 101 (08/01 0400) Resp:  [16-21] 16 (08/01 0400) BP: (104-134)/(47-65) 108/56 mmHg (08/01 0400) SpO2:  [95 %-100 %] 97 % (08/01 0400) Last BM Date: 11/19/12  Intake/Output from previous day: 07/31 0701 - 08/01 0700 In: 1070 [I.V.:520; NG/GT:550] Out: 1177 [Urine:751; Drains:425; Stool:1] Intake/Output this shift:    General appearance: alert, cooperative and no distress Resp: clear to auscultation bilaterally GI: soft, less erythema around drain; more drain output since upsizing Wound - granulating nicely; fistula still draining in upper wound  Lab Results:   Recent Labs  11/20/12 0410 11/21/12 0443  WBC 34.0* 26.3*  HGB 9.9* 9.6*  HCT 29.2* 28.8*  PLT 501* 544*   BMET No results found for this basename: NA, K, CL, CO2, GLUCOSE, BUN, CREATININE, CALCIUM,  in the last 72 hours PT/INR  Recent Labs  11/20/12 1030  LABPROT 14.4  INR 1.14   ABG No results found for this basename: PHART, PCO2, PO2, HCO3,  in the last 72 hours  Studies/Results: Ct Angio Chest Pe W/cm &/or Wo Cm  11/19/2012   *RADIOLOGY REPORT*  Clinical Data: Chest pain  CT ANGIOGRAPHY CHEST  Technique:  Multidetector CT imaging of the chest using the standard protocol during bolus administration of intravenous contrast. Multiplanar reconstructed images including MIPs were obtained and reviewed to evaluate the vascular anatomy.  Contrast: OMNIPAQUE IOHEXOL 350 MG/ML SOLN total as contrast had to be real administered due to bolus timing.  Comparison: CT scan of August 05, 2012.  Findings: Mild left pleural effusion is noted with adjacent subsegmental atelectasis.  Minimal  subsegmental atelectasis is noted posteriorly in the right lung base. Atherosclerotic calcifications are seen involving the thoracic aorta.  No dissection is noted. 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 No definite evidence of new thrombus is noted.  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  No definite evidence of new thrombus is noted.  Mild left pleural effusion is noted with adjacent subsegmental atelectasis.   Original Report Authenticated By: Lupita Raider.,  M.D.   Ct Abdomen Pelvis W Contrast  11/19/2012   *RADIOLOGY REPORT*  Clinical Data: Gastrocutaneous fistula.  Recent exploratory laparotomy.  Persistent elevated white count.  CT ABDOMEN AND PELVIS WITH CONTRAST  Technique:  Multidetector CT imaging of the abdomen and pelvis was performed following the standard protocol during bolus administration of intravenous contrast.  Contrast: OMNIPAQUE IOHEXOL 350 MG/ML SOLN  Comparison: 11/10/2012  Findings: Open midline abdominal incision.  There is oral contrast noted within the incision and anterior abdominal wall soft tissues. Cannot exclude persistent gastrocutaneous fistula.  Multiple fluid collections throughout the abdomen and pelvis.  The largest is left subdiaphragmatic measuring 5.6 x 3.9 cm on image 19.  There is fluid collection containing gas along the left liver margin anterior to the stomach measuring 5.2 x 1.8 cm.  Within the upper pelvis to the right of  midline, there is a 3.7 x 2.1 cm fluid collection on image 57.  Superior to this collection is a 3.3 cm fluid collection. Multiloculated fluid collections adjacent to the transverse colon posterior to the suture lines.  There is a  jejunostomy catheter in place.  Small amount of free fluid in the pelvis.  2.4 cm cyst within the right ovary.  No left adnexal mass. Prior hysterectomy.  Liver, spleen, pancreas, adrenals are unremarkable.  Multiple cysts in the kidneys bilaterally.  Aorta and iliac vessels are heavily calcified, non-aneurysmal.  IMPRESSION: Numerous abdominal and pelvic fluid collections, most likely abscesses.  Open midline incision.  There is oral contrast within the incision and the anterior soft tissues deep to the incision.  Cannot exclude persistent gastrocutaneous fistula.   Original Report Authenticated By: Charlett Nose, M.D.   Ir Sinus/fist Tube Chk-non Gi  11/19/2012   *RADIOLOGY REPORT*  Clinical Data: Gastrocutaneous fistula, right upper quadrant percutaneous drain, drainage around the skin site  CONTRAST INJECTION OF THE EXISTING DRAIN CATHETER EXCHANGE AND UPSIZE OF THE RIGHT UPPER QUADRANT FISTULA DRAIN  Date:  11/19/2012 13:45:00  Radiologist:  Judie Petit. Ruel Favors, M.D.  Medications:  1% lidocaine locally  Guidance:  Fluoroscopic  Fluoroscopy time:  1 minute 6 seconds  Sedation time:  None.  Contrast volume:  10 ml Omnipaque-300  Complications:  No immediate  PROCEDURE/FINDINGS:  Informed consent was obtained from the patient following explanation of the procedure, risks, benefits and alternatives. The patient understands, agrees and consents for the procedure. All questions were addressed.  A time out was performed.  Maximal barrier sterile technique utilized including caps, mask, sterile gowns, sterile gloves, large sterile drape, hand hygiene, and Chloroprep  Under sterile conditions the existing right upper quadrant pigtail drain was injected.  Contrast immediately drains into the stomach compatible with patent gastrocutaneous fistula.  Contrast also drains into the midline abdominal wound.  Drain catheter exchange and upsize:  Over Amplatz guide wire, this catheter was cut and removed.  A 12-French catheter was  advanced with the retention loop formed in the small residual collection in the right upper quadrant.  Contrast injection reconfirms position. Catheter secured with a Prolene suture.  Suction bulb exchanged for a gravity drainage bag in attempt to promote fistula healing.  IMPRESSION: Exchange and upsize of the right upper quadrant fistula drain.  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   Original Report Authenticated By: Judie Petit. Miles Costain, M.D.   Ir Catheter Tube Change  11/19/2012   *RADIOLOGY REPORT*  Clinical Data: Gastrocutaneous fistula, right upper quadrant percutaneous drain, drainage around the skin site  CONTRAST INJECTION OF THE EXISTING DRAIN CATHETER EXCHANGE AND UPSIZE OF THE RIGHT UPPER QUADRANT FISTULA DRAIN  Date:  11/19/2012 13:45:00  Radiologist:  Judie Petit. Ruel Favors, M.D.  Medications:  1% lidocaine locally  Guidance:  Fluoroscopic  Fluoroscopy time:  1 minute 6 seconds  Sedation time:  None.  Contrast volume:  10 ml Omnipaque-300  Complications:  No immediate  PROCEDURE/FINDINGS:  Informed consent was obtained from the patient following explanation of the procedure, risks, benefits and alternatives. The patient understands, agrees and consents for the procedure. All questions were addressed.  A time out was performed.  Maximal barrier sterile technique utilized including caps, mask, sterile gowns, sterile gloves, large sterile drape, hand hygiene, and Chloroprep  Under sterile conditions the existing right upper quadrant pigtail drain was injected.  Contrast immediately drains into the stomach compatible with patent gastrocutaneous fistula.  Contrast  also drains into the midline abdominal wound.  Drain catheter exchange and upsize:  Over Amplatz guide wire, this catheter was cut and removed.  A 12-French catheter was advanced with the retention loop formed in the small residual collection in the right upper quadrant.  Contrast injection reconfirms position. Catheter  secured with a Prolene suture.  Suction bulb exchanged for a gravity drainage bag in attempt to promote fistula healing.  IMPRESSION: Exchange and upsize of the right upper quadrant fistula drain.  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   Original Report Authenticated By: Judie Petit. Miles Costain, M.D.   Ct Aspiration  11/20/2012   *RADIOLOGY REPORT*  CT GUIDED ASPIRATION OF  Date: 11/20/2012  Clinical History: 67 year old female with complex medical history including multiple intra-abdominal fluid collections status post surgery.  CT guided aspiration of the largest left upper quadrant fluid collection is requested.  If the fluid is obviously purulent, a drain will be left in place.  Procedures Performed: 1. CT guided aspiration of left upper quadrant fluid collection  Interventional Radiologist:  Sterling Big, MD  Sedation: Moderate (conscious) sedation was used.  One mg Versed, 50 mcg Fentanyl were administered intravenously.  The patient's vital signs were monitored continuously by radiology nursing throughout the procedure.  Sedation Time: 10 minutes  PROCEDURE/FINDINGS:   Informed consent was obtained from the patient following explanation of the procedure, risks, benefits and alternatives. The patient understands, agrees and consents for the procedure. All questions were addressed. A time out was performed.  Maximal barrier sterile technique utilized including caps, mask, sterile gowns, sterile gloves, large sterile drape, hand hygiene, and betadine skin prep.  A planning axial CT scan was performed.  The left upper quadrant fluid collection was successfully identified.  A suitable skin entry site was selected and marked.  Local anesthesia was obtained by infiltration with 1% lidocaine.  An 18 gauge trocar needle was advanced over rib and into the fluid collection.  Approximately 28 ml of brownish red serosanguinous fluid containing fibrinous debris was successfully aspirated.   There is no frank purulence or foul- smelling.  The appearance of fluid was most consistent with a liquefied hematoma.  The absence of definitive infection, decision was made not to leave a drain in place.  The fluid will be sent for Gram stain and culture.  The needle was removed.  A sterile bandage was placed.  The patient tolerated the procedure well.  There is no immediate complication.  IMPRESSION:  CT guided aspiration of left upper quadrant fluid collection. Approximately 28 ml of reddish brown serosanguinous fluid containing fibrinous debris was successfully aspirated.  The appearance of fluid is most consistent with old liquefied hematoma. Given the absence of obvious purulence, a drain was not left in place.  The fluid will be sent for culture.  Signed,  Sterling Big, MD Vascular & Interventional Radiologist [facility]   Original Report Authenticated By: Malachy Moan, M.D.    Anti-infectives: Anti-infectives   Start     Dose/Rate Route Frequency Ordered Stop   11/20/12 0730  ertapenem (INVANZ) 1 g in sodium chloride 0.9 % 50 mL IVPB     1 g 100 mL/hr over 30 Minutes Intravenous Every 24 hours 11/20/12 0709     11/10/12 1000  piperacillin-tazobactam (ZOSYN) IVPB 3.375 g  Status:  Discontinued     3.375 g 12.5 mL/hr over 240 Minutes Intravenous 3 times per day 11/10/12 0937 11/20/12 0709      Assessment/Plan:  s/p Procedure(s): EXPLORATORY LAPAROTOMY (N/A) SMALL BOWEL RESECTION (N/A) APPLICATION OF WOUND VAC (N/A) PLACEMENT OF FEEDING JEJUNOSTOMY TUBE (N/A) LYSIS OF ADHESION (N/A) Continue Invanz - Day 2 Tube feeds at goal Continue PCA for now - hopefully will be able to transition to Fentanyl patch and pain medicine per j-tube in a few days Dressing changes q6 Watch cultures from body fluid Ambulate   LOS: 12 days    Matalynn Graff K. 11/21/2012

## 2012-11-21 NOTE — Progress Notes (Signed)
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     PT Comments   Pt admitted with SBO and peritonitis. Pt currently with functional limitations due to continued weakness and poor endurance.  Pt will benefit  from skilled PT to increase their independence and safety with mobility to allow discharge to the venue listed below.   Follow Up Recommendations  Home health PT;Supervision/Assistance - 24 hour                 Equipment Recommendations  None recommended by PT        Frequency Min 3X/week   Progress towards PT Goals Progress towards PT goals: Progressing toward goals  Plan Current plan remains appropriate    Precautions / Restrictions Precautions Precautions: Fall Precaution Comments: jp drain, peg tube Restrictions Weight Bearing Restrictions: No   Pertinent Vitals/Pain VSS, some pain    Mobility  Bed Mobility Bed Mobility: Not assessed Transfers Transfers: Sit to Stand;Stand to Sit Sit to Stand: 5: Supervision;With upper extremity assist;From bed Stand to Sit: 5: Supervision;With upper extremity assist;With armrests;To chair/3-in-1 Details for Transfer Assistance: guarding for safety and lines Ambulation/Gait Ambulation/Gait Assistance: 5: Supervision Ambulation Distance (Feet): 300 Feet Assistive device: None Ambulation/Gait Assistance Details: needed cues to extend trunk secondary to flexed posture. 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      Subjective Data  Subjective: "I finally feel a llittle better."   Cognition  Cognition Arousal/Alertness: Awake/alert Behavior During Therapy: WFL for tasks assessed/performed Overall Cognitive Status: Within Functional Limits for tasks assessed    Balance  Static Standing Balance Static Standing - Balance Support: Bilateral upper extremity supported;During functional activity Static Standing - Level of Assistance: 5: Stand by assistance Static Standing - Comment/# of Minutes: 3  End of Session PT - End of Session Equipment Utilized During Treatment: Gait belt;Oxygen Activity Tolerance: Patient limited by pain;Patient limited by fatigue Patient left: in chair;with call bell/phone within reach;with nursing/sitter in room Nurse Communication: Mobility status        INGOLD,Monteen Toops 11/21/2012, 4:44 PM  [facility] [phone] (pager)

## 2012-11-21 NOTE — Progress Notes (Signed)
ANTICOAGULATION CONSULT NOTE - Follow Up Consult  Pharmacy Consult: Xarelto Indication:  History of PE  Allergies  Allergen Reactions  . Avelox (Moxifloxacin Hcl In Nacl) Nausea And Vomiting  . Betadine (Povidone Iodine) Itching and Rash  . Alendronate Sodium Nausea And Vomiting  . Aspirin Nausea Only  . Codeine Nausea And Vomiting  . Doxycycline     Pt doesn't remember reaction  . Fluconazole     Pt doesn't remember reaction  . Hydrocodone Nausea And Vomiting    GI distress  . Neurontin (Gabapentin) Other (See Comments)    Mood changes   . Nsaids Other (See Comments)    Severe gastritis & perforation - avoid NSAIDs when possible  . Sertraline Hcl Other (See Comments)    Hallucinations   . Sulfa Antibiotics Rash    Patient Measurements: Height: 5' 4.96" (165 cm) Weight: 104 lb 15 oz (47.6 kg) IBW/kg (Calculated) : 56.91  Vital Signs: Temp: 99.1 F (37.3 C) (08/01 0800) Temp src: Oral (08/01 0800) BP: 115/57 mmHg (08/01 0800) Pulse Rate: 105 (08/01 0800)  Labs:  Recent Labs  11/19/12 0455 11/19/12 1005 11/19/12 1620 11/20/12 0410 11/20/12 1030 11/21/12 0443  HGB 10.5*  --   --  9.9*  --  9.6*  HCT 31.3*  --   --  29.2*  --  28.8*  PLT 467*  --   --  501*  --  544*  LABPROT  --   --   --   --  14.4  --   INR  --   --   --   --  1.14  --   TROPONINI <0.30 <0.30 <0.30  --   --   --     Estimated Creatinine Clearance: 52.7 ml/min (by C-G formula based on Cr of 0.49).   Assessment: 69 YOF with history of bilateral PE's to resume home Xarelto.  Patient's renal function has been stable.  No bleeding reported.  Goal of Therapy:  Full anticoagulation Monitor platelets by anticoagulation protocol: Yes    Plan:  Continue Xarelto 20mg  via J tube daily  Monitor renal fxn, CBC, s/sx of bleeding  Wendie Simmer, PharmD, BCPS Clinical Pharmacist  Pager: [phone]

## 2012-11-22 LAB — CBC
Hemoglobin: 9.5 g/dL — ABNORMAL LOW (ref 12.0–15.0)
MCH: 26.5 pg (ref 26.0–34.0)
MCHC: 34.2 g/dL (ref 30.0–36.0)
Platelets: 579 10*3/uL — ABNORMAL HIGH (ref 150–400)
RBC: 3.58 MIL/uL — ABNORMAL LOW (ref 3.87–5.11)

## 2012-11-22 MED ORDER — MICAFUNGIN SODIUM 50 MG IV SOLR
100.0000 mg | INTRAVENOUS | Status: AC
  Administered 2012-11-22 – 2012-12-01 (×10): 100 mg via INTRAVENOUS
  Filled 2012-11-22 (×13): qty 100

## 2012-11-22 NOTE — Progress Notes (Signed)
[facility] Progress Note 11 Days Post-Op  Subjective: Patient wants to eat, but understands why she cannot  Objective: Vital signs in last 24 hours: Temp:  [97.9 F (36.6 C)-98.9 F (37.2 C)] 98.3 F (36.8 C) (08/02 0731) Pulse Rate:  [97-114] 97 (08/02 0731) Resp:  [14-24] 14 (08/02 0731) BP: (109-119)/(57-69) 115/69 mmHg (08/02 0731) SpO2:  [93 %-100 %] 98 % (08/02 0815) Last BM Date: 11/21/12  Intake/Output from previous day: 08/01 0701 - 08/02 0700 In: 2747.6 [I.V.:1327.6; NG/GT:1320; IV Piggyback:100] Out: 1850 [Urine:1300; Drains:550] Intake/Output this shift: Total I/O In: 190 [I.V.:80; NG/GT:110] Out: 150 [Drains:150]  General: No acute distress  Lungs: Clear to auscultation.  Sats are okay.  Abd: Tender on the right near drain site.  Has bowel sounds and is tolerating tube feedings very well.  Extremities: No DVT signs or symptoms  Neuro: Intact  Lab Results:  BMET No results found for this basename: NA, K, CL, CO2, GLUCOSE, BUN, CREATININE, CALCIUM,  in the last 72 hours PT/INR  Recent Labs  11/20/12 1030  LABPROT 14.4  INR 1.14   ABG No results found for this basename: PHART, PCO2, PO2, HCO3,  in the last 72 hours  Studies/Results: Ct Aspiration  11/20/2012   *RADIOLOGY REPORT*  CT GUIDED ASPIRATION OF  Date: 11/20/2012  Clinical History: 67 year old female with complex medical history including multiple intra-abdominal fluid collections status post surgery.  CT guided aspiration of the largest left upper quadrant fluid collection is requested.  If the fluid is obviously purulent, a drain will be left in place.  Procedures Performed: 1. CT guided aspiration of left upper quadrant fluid collection  Interventional Radiologist:  Sterling Big, MD  Sedation: Moderate (conscious) sedation was used.  One mg Versed, 50 mcg Fentanyl were administered intravenously.  The patient's vital signs were monitored continuously by radiology nursing throughout the  procedure.  Sedation Time: 10 minutes  PROCEDURE/FINDINGS:   Informed consent was obtained from the patient following explanation of the procedure, risks, benefits and alternatives. The patient understands, agrees and consents for the procedure. All questions were addressed. A time out was performed.  Maximal barrier sterile technique utilized including caps, mask, sterile gowns, sterile gloves, large sterile drape, hand hygiene, and betadine skin prep.  A planning axial CT scan was performed.  The left upper quadrant fluid collection was successfully identified.  A suitable skin entry site was selected and marked.  Local anesthesia was obtained by infiltration with 1% lidocaine.  An 18 gauge trocar needle was advanced over rib and into the fluid collection.  Approximately 28 ml of brownish red serosanguinous fluid containing fibrinous debris was successfully aspirated.  There is no frank purulence or foul- smelling.  The appearance of fluid was most consistent with a liquefied hematoma.  The absence of definitive infection, decision was made not to leave a drain in place.  The fluid will be sent for Gram stain and culture.  The needle was removed.  A sterile bandage was placed.  The patient tolerated the procedure well.  There is no immediate complication.  IMPRESSION:  CT guided aspiration of left upper quadrant fluid collection. Approximately 28 ml of reddish brown serosanguinous fluid containing fibrinous debris was successfully aspirated.  The appearance of fluid is most consistent with old liquefied hematoma. Given the absence of obvious purulence, a drain was not left in place.  The fluid will be sent for culture.  Signed,  Sterling Big, MD Vascular & Interventional Radiologist [facility]  Original Report Authenticated By: Malachy Moan, M.D.    Anti-infectives: Anti-infectives   Start     Dose/Rate Route Frequency Ordered Stop   11/20/12 0730  ertapenem (INVANZ) 1 g in sodium  chloride 0.9 % 50 mL IVPB     1 g 100 mL/hr over 30 Minutes Intravenous Every 24 hours 11/20/12 0709     11/10/12 1000  piperacillin-tazobactam (ZOSYN) IVPB 3.375 g  Status:  Discontinued     3.375 g 12.5 mL/hr over 240 Minutes Intravenous 3 times per day 11/10/12 0937 11/20/12 0709      Assessment/Plan: s/p Procedure(s): EXPLORATORY LAPAROTOMY SMALL BOWEL RESECTION APPLICATION OF WOUND VAC PLACEMENT OF FEEDING JEJUNOSTOMY TUBE LYSIS OF ADHESION Continue present management 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  LOS: 13 days   Marta Lamas. Gae Bon, MD, FACS [phone] [facility] 11/22/2012

## 2012-11-23 LAB — CBC
Platelets: 612 10*3/uL — ABNORMAL HIGH (ref 150–400)
RBC: 3.61 MIL/uL — ABNORMAL LOW (ref 3.87–5.11)
RDW: 21.4 % — ABNORMAL HIGH (ref 11.5–15.5)
WBC: 21.4 10*3/uL — ABNORMAL HIGH (ref 4.0–10.5)

## 2012-11-23 MED ORDER — FENTANYL 25 MCG/HR TD PT72
25.0000 ug | MEDICATED_PATCH | Freq: Once | TRANSDERMAL | Status: AC
  Administered 2012-11-23: 25 ug via TRANSDERMAL
  Filled 2012-11-23: qty 1

## 2012-11-23 MED ORDER — LORAZEPAM 2 MG/ML IJ SOLN
1.0000 mg | Freq: Four times a day (QID) | INTRAMUSCULAR | Status: DC | PRN
  Administered 2012-11-25 – 2012-11-30 (×2): 1 mg via INTRAVENOUS
  Filled 2012-11-23 (×2): qty 1

## 2012-11-23 MED ORDER — FENTANYL 50 MCG/HR TD PT72
100.0000 ug | MEDICATED_PATCH | TRANSDERMAL | Status: DC
  Filled 2012-11-23: qty 2

## 2012-11-23 MED ORDER — HYDROMORPHONE HCL PF 1 MG/ML IJ SOLN
1.0000 mg | INTRAMUSCULAR | Status: DC | PRN
  Administered 2012-11-23 – 2012-12-18 (×160): 1 mg via INTRAVENOUS
  Filled 2012-11-23 (×164): qty 1

## 2012-11-23 NOTE — Progress Notes (Signed)
12 Days Post-Op  Subjective: Complains of pain.  Responded well to ativan.  Passing flatus.   Objective: Vital signs in last 24 hours: Temp:  [97.6 F (36.4 C)-99.5 F (37.5 C)] 98.1 F (36.7 C) (08/03 0738) Pulse Rate:  [96-115] 102 (08/03 0738) Resp:  [15-23] 21 (08/03 0738) BP: (111-119)/(58-66) 119/64 mmHg (08/03 0738) SpO2:  [96 %-98 %] 96 % (08/03 0738) Last BM Date: 11/21/12  Intake/Output from previous day: 08/02 0701 - 08/03 0700 In: 1894.4 [I.V.:760.8; NG/GT:983.6; IV Piggyback:150] Out: 1015 [Urine:500; Drains:515] Intake/Output this shift: Total I/O In: 786.3 [I.V.:314.7; NG/GT:421.7; IV Piggyback:50] Out: 50 [Drains:50]  Chest wall: no tenderness Cardio: regular rate and rhythm, S1, S2 normal, no murmur, click, rub or gallop GI: slight distension.  diffuse tenderness but not peritonitis. J tube working well site clean wound not taken down this am  Lab Results:   Recent Labs  11/22/12 0500 11/23/12 0400  WBC 23.5* 21.4*  HGB 9.5* 9.3*  HCT 27.8* 28.2*  PLT 579* 612*   BMET No results found for this basename: NA, K, CL, CO2, GLUCOSE, BUN, CREATININE, CALCIUM,  in the last 72 hours PT/INR  Recent Labs  11/20/12 1030  LABPROT 14.4  INR 1.14   ABG No results found for this basename: PHART, PCO2, PO2, HCO3,  in the last 72 hours  Studies/Results: No results found.  Anti-infectives: Anti-infectives   Start     Dose/Rate Route Frequency Ordered Stop   11/22/12 2200  micafungin (MYCAMINE) 100 mg in sodium chloride 0.9 % 100 mL IVPB     100 mg 100 mL/hr over 1 Hours Intravenous Every 24 hours 11/22/12 2103     11/20/12 0730  ertapenem (INVANZ) 1 g in sodium chloride 0.9 % 50 mL IVPB     1 g 100 mL/hr over 30 Minutes Intravenous Every 24 hours 11/20/12 0709     11/10/12 1000  piperacillin-tazobactam (ZOSYN) IVPB 3.375 g  Status:  Discontinued     3.375 g 12.5 mL/hr over 240 Minutes Intravenous 3 times per day 11/10/12 0937 11/20/12 0709       Assessment/Plan: s/p Procedure(s): EXPLORATORY LAPAROTOMY (N/A) SMALL BOWEL RESECTION (N/A) APPLICATION OF WOUND VAC (N/A) PLACEMENT OF FEEDING JEJUNOSTOMY TUBE (N/A) LYSIS OF ADHESION (N/A) Increase duragesic patch and ativan. Continue supportive care.  Check labs in am.  No significant change.   LOS: 14 days    Durwin Davisson A. 11/23/2012

## 2012-11-24 DIAGNOSIS — E876 Hypokalemia: Secondary | ICD-10-CM

## 2012-11-24 LAB — BODY FLUID CULTURE

## 2012-11-24 LAB — COMPREHENSIVE METABOLIC PANEL
ALT: 10 U/L (ref 0–35)
AST: 17 U/L (ref 0–37)
Alkaline Phosphatase: 104 U/L (ref 39–117)
CO2: 29 mEq/L (ref 19–32)
Calcium: 8.8 mg/dL (ref 8.4–10.5)
Chloride: 102 mEq/L (ref 96–112)
GFR calc non Af Amer: >90 mL/min (ref >90)
Glucose, Bld: 116 mg/dL — ABNORMAL HIGH (ref 70–99)
Potassium: 3.2 mEq/L — ABNORMAL LOW (ref 3.5–5.1)
Sodium: 139 mEq/L (ref 135–145)
Total Bilirubin: 0.2 mg/dL — ABNORMAL LOW (ref 0.3–1.2)

## 2012-11-24 LAB — CBC
HCT: 27.7 % — ABNORMAL LOW (ref 36.0–46.0)
Hemoglobin: 9.3 g/dL — ABNORMAL LOW (ref 12.0–15.0)
RBC: 3.57 MIL/uL — ABNORMAL LOW (ref 3.87–5.11)
WBC: 18.1 10*3/uL — ABNORMAL HIGH (ref 4.0–10.5)

## 2012-11-24 LAB — PREALBUMIN: Prealbumin: 5.9 mg/dL — ABNORMAL LOW (ref 17.0–34.0)

## 2012-11-24 MED ORDER — FLUOXETINE HCL 20 MG/5ML PO SOLN
20.0000 mg | Freq: Every day | ORAL | Status: DC
  Administered 2012-11-24 – 2012-12-19 (×24): 20 mg
  Filled 2012-11-24 (×27): qty 5

## 2012-11-24 MED ORDER — MORPHINE SULFATE 10 MG/5ML PO SOLN
10.0000 mg | ORAL | Status: DC | PRN
  Administered 2012-11-24 – 2012-12-11 (×3): 10 mg via ORAL
  Filled 2012-11-24 (×3): qty 5

## 2012-11-24 MED ORDER — POTASSIUM CHLORIDE 10 MEQ/50ML IV SOLN
10.0000 meq | Freq: Once | INTRAVENOUS | Status: AC
  Administered 2012-11-24: 10 meq via INTRAVENOUS
  Filled 2012-11-24: qty 50

## 2012-11-24 MED ORDER — HYDROMORPHONE HCL 1 MG/ML PO LIQD: 2.0000 mg | ORAL | Status: DC | PRN

## 2012-11-24 MED ORDER — POTASSIUM CHLORIDE 10 MEQ/50ML IV SOLN
10.0000 meq | INTRAVENOUS | Status: AC
  Administered 2012-11-24 (×2): 10 meq via INTRAVENOUS
  Filled 2012-11-24 (×2): qty 50

## 2012-11-24 NOTE — Progress Notes (Signed)
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     PT Comments   Pt only amb to bathroom and back.  Pt moving very well but self limiting today with distance.  Follow Up Recommendations  No PT follow up;Supervision - Intermittent     Does the patient have the potential to tolerate intense rehabilitation     Barriers to Discharge        Equipment Recommendations  None recommended by PT    Recommendations for Other Services    Frequency Min 3X/week   Progress towards PT Goals Progress towards PT goals: Progressing toward goals  Plan Discharge plan needs to be updated    Precautions / Restrictions Precautions Precautions: None   Pertinent Vitals/Pain See flow sheet.    Mobility  Transfers Sit to Stand: 5: Supervision;With upper extremity assist;With armrests;From chair/3-in-1;From toilet Stand to Sit: 5: Supervision;With upper extremity assist;With armrests;To chair/3-in-1;To toilet Ambulation/Gait Ambulation/Gait Assistance: 5: Supervision Ambulation Distance (Feet): 20 Feet Assistive device: None Gait Pattern: Step-to pattern;Decreased stride length    Exercises     PT Diagnosis:    PT Problem List:   PT Treatment Interventions:     PT Goals (current goals can now be found in the care plan section)    Visit Information  Last PT Received On: 11/24/12 Assistance Needed: +1 History of Present Illness: 67 year old female with PMH of COPD and multiple abdominal surgeries who presents to the ICU post abdominal surgery due to peritonitis and gastrocutaneous fistula  requiring surgical repair and lysis of adhesions.      Subjective Data      Cognition  Cognition Arousal/Alertness: Awake/alert Behavior During Therapy: WFL for tasks assessed/performed Overall Cognitive Status: Within Functional Limits for tasks assessed    Balance  Static Standing Balance Static Standing - Balance Support: No upper extremity supported Static Standing - Level of Assistance: 6: Modified independent (Device/Increase time)  End of Session PT - End of Session Activity Tolerance: Other (comment) (Pt refusing further amb) Patient left: in chair;with call bell/phone within reach;with family/visitor present Nurse Communication: Mobility status;Other (comment) (left O2 off of pt)   GP     Declin Rajan 11/24/2012, 1:15 PM  [facility] PT [phone]

## 2012-11-24 NOTE — Progress Notes (Signed)
ANTICOAGULATION CONSULT NOTE - Follow Up Consult  Pharmacy Consult: Xarelto Indication:  History of PE  Allergies  Allergen Reactions  . Avelox (Moxifloxacin Hcl In Nacl) Nausea And Vomiting  . Betadine (Povidone Iodine) Itching and Rash  . Alendronate Sodium Nausea And Vomiting  . Aspirin Nausea Only  . Codeine Nausea And Vomiting  . Doxycycline     Pt doesn't remember reaction  . Fluconazole     Pt doesn't remember reaction  . Hydrocodone Nausea And Vomiting    GI distress  . Neurontin (Gabapentin) Other (See Comments)    Mood changes   . Nsaids Other (See Comments)    Severe gastritis & perforation - avoid NSAIDs when possible  . Sertraline Hcl Other (See Comments)    Hallucinations   . Sulfa Antibiotics Rash    Patient Measurements: Height: 5' 4.96" (165 cm) Weight: 104 lb 15 oz (47.6 kg) IBW/kg (Calculated) : 56.91  Vital Signs: Temp: 97.6 F (36.4 C) (08/04 0924) Temp src: Oral (08/04 0924) BP: 121/69 mmHg (08/04 0924) Pulse Rate: 110 (08/04 0924)  Labs:  Recent Labs  11/22/12 0500 11/23/12 0400 11/24/12 0500  HGB 9.5* 9.3* 9.3*  HCT 27.8* 28.2* 27.7*  PLT 579* 612* 630*  CREATININE  --   --  0.43*    Estimated Creatinine Clearance: 52.7 ml/min (by C-G formula based on Cr of 0.43).   Assessment: 84 YOF with history of bilateral PE's to resume home Xarelto.  Patient's renal function and CBC has been stable.  No bleeding reported.  Goal of Therapy:  Full anticoagulation Monitor platelets by anticoagulation protocol: Yes    Plan:  I ordered her home prozac 20 mg per tube daily Continue Xarelto 20mg  via J tube daily  Monitor renal fxn, CBC, s/sx of bleeding  Herby Abraham, Pharm.D. [phone] 11/24/2012 12:20 PM

## 2012-11-24 NOTE — Progress Notes (Addendum)
13 Days Post-Op  Subjective: Some nausea  Objective: Vital signs in last 24 hours: Temp:  [97.5 F (36.4 C)-99.3 F (37.4 C)] 98.4 F (36.9 C) (08/04 0303) Pulse Rate:  [99-117] 104 (08/04 0303) Resp:  [14-25] 18 (08/04 0400) BP: (111-121)/(54-66) 115/60 mmHg (08/04 0303) SpO2:  [91 %-96 %] 96 % (08/04 0400) Last BM Date: 11/21/12  Intake/Output from previous day: 08/03 0701 - 08/04 0700 In: 1607.1 [I.V.:670.7; NG/GT:886.4; IV Piggyback:50] Out: 985 [Urine:700; Drains:285] Intake/Output this shift:    General appearance: alert and cooperative Resp: clear to auscultation bilaterally Cardio: regular rate and rhythm GI: soft, J tube in place, GC fistula draining from upper wound, wound minimal slough Extremities: calves soft  Lab Results:   Recent Labs  11/23/12 0400 11/24/12 0500  WBC 21.4* 18.1*  HGB 9.3* 9.3*  HCT 28.2* 27.7*  PLT 612* 630*   BMET  Recent Labs  11/24/12 0500  NA 139  K 3.2*  CL 102  CO2 29  GLUCOSE 116*  BUN 12  CREATININE 0.43*  CALCIUM 8.8   PT/INR No results found for this basename: LABPROT, INR,  in the last 72 hours ABG No results found for this basename: PHART, PCO2, PO2, HCO3,  in the last 72 hours  Studies/Results: No results found.  Anti-infectives: Anti-infectives   Start     Dose/Rate Route Frequency Ordered Stop   11/22/12 2200  micafungin (MYCAMINE) 100 mg in sodium chloride 0.9 % 100 mL IVPB     100 mg 100 mL/hr over 1 Hours Intravenous Every 24 hours 11/22/12 2103     11/20/12 0730  ertapenem (INVANZ) 1 g in sodium chloride 0.9 % 50 mL IVPB     1 g 100 mL/hr over 30 Minutes Intravenous Every 24 hours 11/20/12 0709     11/10/12 1000  piperacillin-tazobactam (ZOSYN) IVPB 3.375 g  Status:  Discontinued     3.375 g 12.5 mL/hr over 240 Minutes Intravenous 3 times per day 11/10/12 0937 11/20/12 0709      Assessment/Plan: s/p Procedure(s): EXPLORATORY LAPAROTOMY (N/A) SMALL BOWEL RESECTION (N/A) APPLICATION OF  WOUND VAC (N/A) PLACEMENT OF FEEDING JEJUNOSTOMY TUBE (N/A) LYSIS OF ADHESION (N/A) GC fistula - dressing changes Protein calorie malnutrition - tol TF per J tube Pain control - D/C PCA and add oral pain meds per tube ID - Invanz empiric, Micafungin for yeast in abdominal fluid Hypokalemia - replace PE - Xarelto To floor   LOS: 15 days    Cynthia Bowen E 11/24/2012

## 2012-11-24 NOTE — Progress Notes (Signed)
Report called to receiving RN on 6N.  Pt transferred to 6N18 via wheelchair with all belongings. Pt called family with new room number.   Roselie Awkward, RN

## 2012-11-25 DIAGNOSIS — I2699 Other pulmonary embolism without acute cor pulmonale: Secondary | ICD-10-CM

## 2012-11-25 DIAGNOSIS — R5381 Other malaise: Secondary | ICD-10-CM

## 2012-11-25 LAB — CBC
MCHC: 32.5 g/dL (ref 30.0–36.0)
RDW: 21.2 % — ABNORMAL HIGH (ref 11.5–15.5)

## 2012-11-25 LAB — BASIC METABOLIC PANEL
GFR calc Af Amer: >90 mL/min (ref >90)
GFR calc non Af Amer: >90 mL/min (ref >90)
Potassium: 3.6 mEq/L (ref 3.5–5.1)
Sodium: 137 mEq/L (ref 135–145)

## 2012-11-25 MED ORDER — ALTEPLASE 2 MG IJ SOLR
2.0000 mg | Freq: Once | INTRAMUSCULAR | Status: AC
  Administered 2012-11-25: 2 mg
  Filled 2012-11-25: qty 2

## 2012-11-25 MED ORDER — FENTANYL 50 MCG/HR TD PT72
100.0000 ug | MEDICATED_PATCH | TRANSDERMAL | Status: DC
  Administered 2012-11-25 – 2012-12-19 (×9): 100 ug via TRANSDERMAL
  Filled 2012-11-25: qty 2
  Filled 2012-11-25: qty 1
  Filled 2012-11-25 (×3): qty 2
  Filled 2012-11-25: qty 1
  Filled 2012-11-25: qty 2
  Filled 2012-11-25 (×2): qty 1
  Filled 2012-11-25: qty 2

## 2012-11-25 NOTE — Progress Notes (Signed)
14 Days Post-Op  Subjective: Some drainage leaked out of her dressing, no other changes, still feels somewhat weak  Objective: Vital signs in last 24 hours: Temp:  [97.6 F (36.4 C)-99.2 F (37.3 C)] 98.7 F (37.1 C) (08/05 0517) Pulse Rate:  [103-115] 103 (08/05 0517) Resp:  [15-18] 16 (08/05 0517) BP: (103-127)/(58-87) 103/59 mmHg (08/05 0517) SpO2:  [92 %-100 %] 99 % (08/05 0517) Last BM Date: 11/24/12  Intake/Output from previous day: 08/04 0701 - 08/05 0700 In: 195 [I.V.:40; NG/GT:55; IV Piggyback:100] Out: 251 [Urine:1; Drains:250] Intake/Output this shift:    General appearance: alert and cooperative Resp: clear to auscultation bilaterally Cardio: regular rate and rhythm GI: soft, upper wound fistula with visable sutures, wound otherwise clean, J tube in place, R drain working well Extremities: no edema  Lab Results:   Recent Labs  11/24/12 0500 11/25/12 0500  WBC 18.1* 17.5*  HGB 9.3* 9.2*  HCT 27.7* 28.3*  PLT 630* 636*   BMET  Recent Labs  11/24/12 0500 11/25/12 0500  NA 139 137  K 3.2* 3.6  CL 102 102  CO2 29 27  GLUCOSE 116* 103*  BUN 12 12  CREATININE 0.43* 0.46*  CALCIUM 8.8 8.9   PT/INR No results found for this basename: LABPROT, INR,  in the last 72 hours ABG No results found for this basename: PHART, PCO2, PO2, HCO3,  in the last 72 hours  Studies/Results: No results found.  Anti-infectives: Anti-infectives   Start     Dose/Rate Route Frequency Ordered Stop   11/22/12 2200  micafungin (MYCAMINE) 100 mg in sodium chloride 0.9 % 100 mL IVPB     100 mg 100 mL/hr over 1 Hours Intravenous Every 24 hours 11/22/12 2103     11/20/12 0730  ertapenem (INVANZ) 1 g in sodium chloride 0.9 % 50 mL IVPB     1 g 100 mL/hr over 30 Minutes Intravenous Every 24 hours 11/20/12 0709     11/10/12 1000  piperacillin-tazobactam (ZOSYN) IVPB 3.375 g  Status:  Discontinued     3.375 g 12.5 mL/hr over 240 Minutes Intravenous 3 times per day 11/10/12  0937 11/20/12 0709      Assessment/Plan: s/p Procedure(s): EXPLORATORY LAPAROTOMY (N/A) SMALL BOWEL RESECTION (N/A) APPLICATION OF WOUND VAC (N/A) PLACEMENT OF FEEDING JEJUNOSTOMY TUBE (N/A) LYSIS OF ADHESION (N/A) GC fistula - dressing changes Protein calorie malnutrition - tol TF per J tube Pain control - adequate now off PCA ID - WBC trending down, Invanz empiric, Micafungin for yeast in abdominal fluid Hypokalemia - improved PE - Xarelto Dispo - at assist level with PT, hopefully home with [facility] later this week   LOS: 16 days    Jordell Outten E 11/25/2012

## 2012-11-25 NOTE — Care Management Note (Signed)
  Page 2 of 2   11/25/2012     12:02:13 PM   CARE MANAGEMENT NOTE 11/25/2012  Patient:  Cynthia Bowen, Cynthia Bowen   Account Number:  [id number]  Date Initiated:  11/12/2012  Documentation initiated by:  [facility]  Subjective/Objective Assessment:   Admitted with abdominal pain. Post op ICU on vent 11-11-21.     Action/Plan:   PT/OT evals   Anticipated DC Date:  11/24/2012   Anticipated DC Plan:  HOME W HOME HEALTH SERVICES      DC Planning Services  CM consult      [facility] Choice  Resumption Of Svcs/PTA Provider   Choice offered to / List presented to:             Status of service:  In process, will continue to follow Medicare Important Message given?   (If response is "NO", the following Medicare IM given date fields will be blank) Date Medicare IM given:   Date Additional Medicare IM given:    Discharge Disposition:    Per UR Regulation:  Reviewed for med. necessity/level of care/duration of stay  If discussed at Long Length of Stay Meetings, dates discussed:   11/18/2012  11/20/2012    Comments:  11-25-12 Confirmed with patient that she had [facility] and [facility] prior to admission and she wants to continue with both at discharge.  Spoke with Maralyn Sago at [facility] phone [phone] fax [phone] . Plan is discharge later this week . MD who has been following patient is off rest of day but will be back 11-26-12 will ask for [facility] orders at that time . Faxed Hand P current wound care orders / tube feed orders to Sarah at [facility] .  Spoke with Lillia Abed at [facility] regarding patient . Faxed required information for tube feedings .  Will need orders for [facility] and tube feeding  Ronny Flurry RN BSN [phone]     11/20/12- 1400- Donn Pierini RN,, BSN [phone] Pt at goal with TF- TNA off- CT showed several fluid collections in abdomen - two largest are in LUQ Will attempt CT-guided aspiration today for culture- received call from [facility] CM - pt to be followed once discharged by [facility] CM at home.  HH services  will need to be resumed with [facility].  11/18/12- 1050- Donn Pierini RN, BSN [phone] Pt currently does not have wound VAC- drsg changes being done by RNs- will need HH-RN for wound care at discharge. pt was on home TNA- now plan to go home on TF-  Per PT note- no recommendations for d/c follow up- NCM to continue to follow for d/c needs.   11-12-12 10:30am Avie Arenas, RNBSN [phone] Per previous notes - patient lives at home alone - gets her TNA infusion from [facility]   [phone].  Has HH with [facility].   Restrained and confused at this time - unable to talk to her.  CM will continue to follow.

## 2012-11-26 ENCOUNTER — Other Ambulatory Visit: Payer: Self-pay

## 2012-11-26 LAB — BASIC METABOLIC PANEL
CO2: 25 mEq/L (ref 19–32)
GFR calc non Af Amer: >90 mL/min (ref >90)
Glucose, Bld: 109 mg/dL — ABNORMAL HIGH (ref 70–99)
Potassium: 3.7 mEq/L (ref 3.5–5.1)
Sodium: 138 mEq/L (ref 135–145)

## 2012-11-26 LAB — CBC
Hemoglobin: 9.7 g/dL — ABNORMAL LOW (ref 12.0–15.0)
RBC: 3.82 MIL/uL — ABNORMAL LOW (ref 3.87–5.11)

## 2012-11-26 NOTE — Progress Notes (Signed)
Physical Therapy Treatment Patient Details Name: Cynthia Bowen MRN: [id number] DOB: Jan 10, 1946 Today's Date: 11/26/2012 Time: 7829-5621 PT Time Calculation (min): 41 min  PT Assessment / Plan / Recommendation  History of Present Illness 67 year old female with PMH of COPD and multiple abdominal surgeries who presents to the ICU post abdominal surgery due to 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     PT Comments   Ambulating further today. Initiated exercises however these caused her a lot of right sided abdominal pain. RN present and providing her with pain medication following our session. Still very deconditioned. I still feel she would benefit from HHPT at this point so as to maximize her strength and independence at home.   Follow Up Recommendations  Home health PT;Supervision - Intermittent     Does the patient have the potential to tolerate intense rehabilitation     Barriers to Discharge        Equipment Recommendations  None recommended by PT    Recommendations for Other Services    Frequency Min 3X/week   Progress towards PT Goals Progress towards PT goals: Progressing toward goals  Plan Discharge plan needs to be updated    Precautions / Restrictions Precautions Precautions: None Precaution Comments: jp drain, peg tube Restrictions Weight Bearing Restrictions: No   Pertinent Vitals/Pain Significant pain following hip abduction in standing (i think she involved her trunk/abs too much, cued her to isolate her hips to avoid lateral flexion), we rested and the nurse came in to give her pain medication    Mobility  Bed Mobility Bed Mobility: Supine to Sit Supine to Sit: 7: Independent Transfers Transfers: Sit to Stand;Stand to Sit Sit to Stand: 6: Modified independent (Device/Increase time);From bed Stand to Sit: 6: Modified independent (Device/Increase time);To chair/3-in-1 Ambulation/Gait Ambulation/Gait  Assistance: 5: Supervision Ambulation Distance (Feet): 800 Feet Assistive device: None Ambulation/Gait Assistance Details: supervision for stability, slow and gaurded because of pain, obviously deconditioned however she walked further today than she has since she has been here Gait Pattern: Step-through pattern Gait velocity: decreased    Exercises General Exercises - Lower Extremity Long Arc Quad: AROM;20 reps;Seated Hip ABduction/ADduction: AROM;Both;5 reps;Standing;Limitations Hip Abduction/Adduction Limitations: pt with increased pain after this exercise (right flank, site of drain) so educated her on limiting trunk involvement Other Exercises Other Exercises: standing hip extension x5 bilaterally    PT Goals (current goals can now be found in the care plan section)    Visit Information  Last PT Received On: 11/26/12 Assistance Needed: +1 History of Present Illness: 67 year old female with PMH of COPD and multiple abdominal surgeries who presents to the ICU post abdominal surgery due to 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      Subjective Data      Cognition  Cognition Arousal/Alertness: Awake/alert Behavior During Therapy: Flat affect Overall Cognitive Status: Within Functional Limits for tasks assessed    Balance     End of Session PT - End of Session Equipment Utilized During Treatment: Gait belt Activity Tolerance: Patient tolerated treatment well Patient left: in chair;with call bell/phone within reach Nurse Communication: Mobility status;Patient requests pain meds   GP     Cynthia Bowen H 11/26/2012, 1:20 PM

## 2012-11-26 NOTE — Progress Notes (Signed)
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   S/P exploratory laparotomy with lysis of adhesions, small bowel resection, and application of wound VAC 7/22.  CT aspiration of abdominal fluid collection 7/31.  Vital AF 1.2 formula infusing at goal rate of 55 ml/hr via J-tube providing 1584 kcals, 99 gm protein, 1070 ml of free water.  Tolerating well.  Hopefully home this week with HHRN.  Height: Ht Readings from Last 1 Encounters:  11/10/12 5' 4.96" (1.65 m)    Weight Status:   Wt Readings from Last 1 Encounters:  11/16/12 104 lb 15 oz (47.6 kg)    Body mass index is 17.48 kg/(m^2).  Re-estimated needs:  Kcal: 1500-1700 Protein: 80-95 gm Fluid: 1.5-1.7 L  Skin: bilateral abdominal incision   Diet Order: NPO   Intake/Output Summary (Last 24 hours) at 11/26/12 1421 Last data filed at 11/26/12 1610  Gross per 24 hour  Intake   1360 ml  Output   1830 ml  Net   -470 ml    Labs:   Recent Labs Lab 11/24/12 0500 11/25/12 0500 11/26/12 0545  NA 139 137 138  K 3.2* 3.6 3.7  CL 102 102 105  CO2 29 27 25   BUN 12 12 14   CREATININE 0.43* 0.46* 0.54  CALCIUM 8.8 8.9 9.1  GLUCOSE 116* 103* 109*    Scheduled Meds: . ascorbic acid  500 mg Per Tube Daily  . ertapenem  1 g Intravenous Q24H  . fentaNYL  100 mcg Transdermal Q72H  . FLUoxetine  20  mg Per Tube Daily  . micafungin (MYCAMINE) IV  100 mg Intravenous Q24H  . multivitamin  5 mL Per Tube Daily  . pantoprazole sodium  40 mg Per Tube QHS  . rivaroxaban  20 mg Oral Q supper  . sodium chloride  10-40 mL Intracatheter Q12H  . zinc sulfate  220 mg Per Tube Daily    Continuous Infusions: . sodium chloride 40 mL/hr at 11/24/12 0800  . feeding supplement (VITAL AF 1.2 CAL) 1,000 mL (11/25/12 1734)    Maureen Chatters, RD, LDN Pager #: [phone] After-Hours Pager #: [phone]

## 2012-11-26 NOTE — Progress Notes (Signed)
15 Days Post-Op  Subjective: Doing a little better  Objective: Vital signs in last 24 hours: Temp:  [98.1 F (36.7 C)-99.3 F (37.4 C)] 98.1 F (36.7 C) (08/06 0512) Pulse Rate:  [104-109] 107 (08/06 0512) Resp:  [16-17] 17 (08/06 0512) BP: (109-115)/(57-61) 115/57 mmHg (08/06 0512) SpO2:  [94 %-98 %] 97 % (08/06 0512) Last BM Date: 11/24/12  Intake/Output from previous day: 08/05 0701 - 08/06 0700 In: 1360 [I.V.:480; NG/GT:880] Out: 1850 [Urine:1800; Drains:50] Intake/Output this shift:    General appearance: alert and cooperative Resp: clear to auscultation bilaterally Cardio: regular rate and rhythm GI: soft, open wound with granulation and visible fistula upper part, +BS, J tube in place, drain RUQ functional Neurologic: Mental status: Alert, oriented, thought content appropriate  Lab Results:   Recent Labs  11/25/12 0500 11/26/12 0545  WBC 17.5* 15.9*  HGB 9.2* 9.7*  HCT 28.3* 29.9*  PLT 636* 684*   BMET  Recent Labs  11/25/12 0500 11/26/12 0545  NA 137 138  K 3.6 3.7  CL 102 105  CO2 27 25  GLUCOSE 103* 109*  BUN 12 14  CREATININE 0.46* 0.54  CALCIUM 8.9 9.1   PT/INR No results found for this basename: LABPROT, INR,  in the last 72 hours ABG No results found for this basename: PHART, PCO2, PO2, HCO3,  in the last 72 hours  Studies/Results: No results found.  Anti-infectives: Anti-infectives   Start     Dose/Rate Route Frequency Ordered Stop   11/22/12 2200  micafungin (MYCAMINE) 100 mg in sodium chloride 0.9 % 100 mL IVPB     100 mg 100 mL/hr over 1 Hours Intravenous Every 24 hours 11/22/12 2103     11/20/12 0730  ertapenem (INVANZ) 1 g in sodium chloride 0.9 % 50 mL IVPB     1 g 100 mL/hr over 30 Minutes Intravenous Every 24 hours 11/20/12 0709     11/10/12 1000  piperacillin-tazobactam (ZOSYN) IVPB 3.375 g  Status:  Discontinued     3.375 g 12.5 mL/hr over 240 Minutes Intravenous 3 times per day 11/10/12 0937 11/20/12 0709       Assessment/Plan: s/p Procedure(s): EXPLORATORY LAPAROTOMY (N/A) SMALL BOWEL RESECTION (N/A) APPLICATION OF WOUND VAC (N/A) PLACEMENT OF FEEDING JEJUNOSTOMY TUBE (N/A) LYSIS OF ADHESION (N/A) GC fistula - dressing changes, output trending down Protein calorie malnutrition - tol TF per J tube Pain control - adequate now off PCA ID - WBC up a bit, Invanz empiric, Micafungin for yeast in abdominal fluid Hypokalemia - improved PE - Xarelto Dispo - deconditioned - at assist level with PT, hopefully home with [facility] later this week, I D/W RN care manager  LOS: 17 days    Milena Liggett E 11/26/2012

## 2012-11-27 LAB — BASIC METABOLIC PANEL
CO2: 26 mEq/L (ref 19–32)
Calcium: 9.2 mg/dL (ref 8.4–10.5)
Chloride: 102 mEq/L (ref 96–112)
Glucose, Bld: 106 mg/dL — ABNORMAL HIGH (ref 70–99)
Sodium: 137 mEq/L (ref 135–145)

## 2012-11-27 LAB — CBC
Hemoglobin: 9.6 g/dL — ABNORMAL LOW (ref 12.0–15.0)
MCH: 25.9 pg — ABNORMAL LOW (ref 26.0–34.0)
MCV: 77.6 fL — ABNORMAL LOW (ref 78.0–100.0)
RBC: 3.71 MIL/uL — ABNORMAL LOW (ref 3.87–5.11)

## 2012-11-27 NOTE — Progress Notes (Addendum)
ANTICOAGULATION CONSULT NOTE - Follow Up Consult  Pharmacy Consult: Xarelto Indication:  History of PE  Allergies  Allergen Reactions  . Avelox (Moxifloxacin Hcl In Nacl) Nausea And Vomiting  . Betadine (Povidone Iodine) Itching and Rash  . Alendronate Sodium Nausea And Vomiting  . Aspirin Nausea Only  . Codeine Nausea And Vomiting  . Doxycycline     Pt doesn't remember reaction  . Fluconazole     Pt doesn't remember reaction  . Hydrocodone Nausea And Vomiting    GI distress  . Neurontin (Gabapentin) Other (See Comments)    Mood changes   . Nsaids Other (See Comments)    Severe gastritis & perforation - avoid NSAIDs when possible  . Sertraline Hcl Other (See Comments)    Hallucinations   . Sulfa Antibiotics Rash    Patient Measurements: Height: 5' 4.96" (165 cm) Weight: 104 lb 15 oz (47.6 kg) IBW/kg (Calculated) : 56.91  Vital Signs: Temp: 98.2 F (36.8 C) (08/07 0546) Temp src: Oral (08/07 0546) BP: 109/49 mmHg (08/07 0546) Pulse Rate: 94 (08/07 0546)  Labs:  Recent Labs  11/25/12 0500 11/26/12 0545 11/27/12 0510  HGB 9.2* 9.7* 9.6*  HCT 28.3* 29.9* 28.8*  PLT 636* 684* 653*  CREATININE 0.46* 0.54 0.46*    Estimated Creatinine Clearance: 52.7 ml/min (by C-G formula based on Cr of 0.46).   Assessment: 74 YOF with history of bilateral PE's continues on home Xarelto.  Patient's renal function and CBC has been stable.  No bleeding reported.  Goal of Therapy:  Full anticoagulation Monitor platelets by anticoagulation protocol: Yes    Plan:  Continue Xarelto 20mg  daily Monitor renal fxn, CBC, s/sx of bleeding  Christoper Fabian, PharmD, BCPS Clinical pharmacist, pager [phone] 11/27/2012 9:45 AM

## 2012-11-27 NOTE — Progress Notes (Addendum)
16 Days Post-Op  Subjective: C/O some burning at perc drain site, tol TF  Objective: Vital signs in last 24 hours: Temp:  [98 F (36.7 C)-98.2 F (36.8 C)] 98.2 F (36.8 C) (08/07 0546) Pulse Rate:  [94-105] 94 (08/07 0546) Resp:  [18-19] 18 (08/07 0546) BP: (109-122)/(49-64) 109/49 mmHg (08/07 0546) SpO2:  [95 %-97 %] 95 % (08/07 0546) Last BM Date: 11/24/12  Intake/Output from previous day: 08/06 0701 - 08/07 0700 In: 1235 [I.V.:440; NG/GT:605; IV Piggyback:100] Out: 140 [Drains:140] Intake/Output this shift:    General appearance: cooperative Resp: clear to auscultation bilaterally Cardio: regular rate and rhythm GI: soft, tender just at perc drain site, midline wound with GC fistula but otherwise clean Neurologic: Mental status: Alert, oriented, thought content appropriate  Lab Results:   Recent Labs  11/26/12 0545 11/27/12 0510  WBC 15.9* 14.0*  HGB 9.7* 9.6*  HCT 29.9* 28.8*  PLT 684* 653*   BMET  Recent Labs  11/26/12 0545 11/27/12 0510  NA 138 137  K 3.7 3.9  CL 105 102  CO2 25 26  GLUCOSE 109* 106*  BUN 14 13  CREATININE 0.54 0.46*  CALCIUM 9.1 9.2   PT/INR No results found for this basename: LABPROT, INR,  in the last 72 hours ABG No results found for this basename: PHART, PCO2, PO2, HCO3,  in the last 72 hours  Studies/Results: No results found.  Anti-infectives: Anti-infectives   Start     Dose/Rate Route Frequency Ordered Stop   11/22/12 2200  micafungin (MYCAMINE) 100 mg in sodium chloride 0.9 % 100 mL IVPB     100 mg 100 mL/hr over 1 Hours Intravenous Every 24 hours 11/22/12 2103     11/20/12 0730  ertapenem (INVANZ) 1 g in sodium chloride 0.9 % 50 mL IVPB     1 g 100 mL/hr over 30 Minutes Intravenous Every 24 hours 11/20/12 0709     11/10/12 1000  piperacillin-tazobactam (ZOSYN) IVPB 3.375 g  Status:  Discontinued     3.375 g 12.5 mL/hr over 240 Minutes Intravenous 3 times per day 11/10/12 0937 11/20/12 0709       Assessment/Plan: s/p Procedure(s): EXPLORATORY LAPAROTOMY (N/A) SMALL BOWEL RESECTION (N/A) APPLICATION OF WOUND VAC (N/A) PLACEMENT OF FEEDING JEJUNOSTOMY TUBE (N/A) LYSIS OF ADHESION (N/A) GC fistula - dressing changes, output trending down Protein calorie malnutrition - tol TF per J tube ID - WBC back down a bit, Invanz empiric, Micafungin for yeast in abdominal fluid Hypokalemia - improved PE - Xarelto Dispo - deconditioned - at assist level with PT, hopefully home with [facility] later this week   LOS: 18 days    Marque Bango E 11/27/2012

## 2012-11-28 LAB — CBC
HCT: 29.9 % — ABNORMAL LOW (ref 36.0–46.0)
MCH: 24.9 pg — ABNORMAL LOW (ref 26.0–34.0)
MCV: 78.5 fL (ref 78.0–100.0)
Platelets: 667 10*3/uL — ABNORMAL HIGH (ref 150–400)
RBC: 3.81 MIL/uL — ABNORMAL LOW (ref 3.87–5.11)

## 2012-11-28 LAB — BASIC METABOLIC PANEL
BUN: 13 mg/dL (ref 6–23)
CO2: 26 mEq/L (ref 19–32)
Calcium: 9.3 mg/dL (ref 8.4–10.5)
Creatinine, Ser: 0.47 mg/dL — ABNORMAL LOW (ref 0.50–1.10)
Glucose, Bld: 108 mg/dL — ABNORMAL HIGH (ref 70–99)

## 2012-11-28 NOTE — Progress Notes (Signed)
11-28-12 Plan for discharge early next week after completion of IV therapies .    [name] with [facility] [phone]  aware of discharge for next week . [name] also aware patient will have tube feedings through [facility] , drain care and dressing changes . Will fax [facility] orders once dressing change frequency determined.   Young Berry [phone] )  with [facility] aware discharge planned for next week . She will need to be called when discharge date is known .     Ronny Flurry RN BSN

## 2012-11-28 NOTE — Progress Notes (Signed)
17 Days Post-Op  Subjective: Feels a little better, wants ice  Objective: Vital signs in last 24 hours: Temp:  [98.3 F (36.8 C)-99.1 F (37.3 C)] 98.3 F (36.8 C) (08/08 0508) Pulse Rate:  [100-103] 100 (08/08 0508) Resp:  [20] 20 (08/08 0508) BP: (102-120)/(60-63) 102/60 mmHg (08/08 0508) SpO2:  [99 %-100 %] 100 % (08/08 0508) Last BM Date: 11/27/12  Intake/Output from previous day: 08/07 0701 - 08/08 0700 In: 2335 [I.V.:920; NG/GT:1265; IV Piggyback:150] Out: 150 [Drains:150] Intake/Output this shift:    General appearance: alert and cooperative Resp: clear to auscultation bilaterally Cardio: regular rate and rhythm GI: soft, open midline wound with GC fistula stable, drain pale green output Extremities: no edema Neurologic: Mental status: Alert, oriented, thought content appropriate  Lab Results:   Recent Labs  11/27/12 0510 11/28/12 0610  WBC 14.0* 15.5*  HGB 9.6* 9.5*  HCT 28.8* 29.9*  PLT 653* 667*   BMET  Recent Labs  11/26/12 0545 11/27/12 0510  NA 138 137  K 3.7 3.9  CL 105 102  CO2 25 26  GLUCOSE 109* 106*  BUN 14 13  CREATININE 0.54 0.46*  CALCIUM 9.1 9.2   PT/INR No results found for this basename: LABPROT, INR,  in the last 72 hours ABG No results found for this basename: PHART, PCO2, PO2, HCO3,  in the last 72 hours  Studies/Results: No results found.  Anti-infectives: Anti-infectives   Start     Dose/Rate Route Frequency Ordered Stop   11/22/12 2200  micafungin (MYCAMINE) 100 mg in sodium chloride 0.9 % 100 mL IVPB     100 mg 100 mL/hr over 1 Hours Intravenous Every 24 hours 11/22/12 2103     11/20/12 0730  ertapenem (INVANZ) 1 g in sodium chloride 0.9 % 50 mL IVPB     1 g 100 mL/hr over 30 Minutes Intravenous Every 24 hours 11/20/12 0709     11/10/12 1000  piperacillin-tazobactam (ZOSYN) IVPB 3.375 g  Status:  Discontinued     3.375 g 12.5 mL/hr over 240 Minutes Intravenous 3 times per day 11/10/12 0937 11/20/12 0709       Assessment/Plan: s/p Procedure(s): EXPLORATORY LAPAROTOMY (N/A) SMALL BOWEL RESECTION (N/A) APPLICATION OF WOUND VAC (N/A) PLACEMENT OF FEEDING JEJUNOSTOMY TUBE (N/A) LYSIS OF ADHESION (N/A) GC fistula - dressing changes, output trending down Protein calorie malnutrition - tol TF per J tube ID - WBC back down a bit, Invanz d9/10 empiric, Micafungin d7/10 for yeast in abdominal fluid Hypokalemia - improved PE - Xarelto Dispo - hope for home early next week after completion IV therapies if strength continues to improve  LOS: 19 days    Cynthia Bowen 11/28/2012

## 2012-11-28 NOTE — Progress Notes (Signed)
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     PT Comments   Progressing nicely. Seems frustrated with not being allowed to walk. I think she would be fine to walk with her family if nsg helped her organize her lines/IV pole. Need to give her HEP next visit. Reports she was doing LAQ this morning! Educated her on how to progress number of reps to increase her tolerance and strength.   Follow Up Recommendations  Home health PT;Supervision - Intermittent     Does the patient have the potential to tolerate intense rehabilitation     Barriers to Discharge        Equipment Recommendations  None recommended by PT    Recommendations for Other Services    Frequency Min 3X/week   Progress towards PT Goals Progress towards PT goals: Progressing toward goals  Plan Current plan remains appropriate    Precautions / Restrictions Precautions Precautions: None Precaution Comments: jp drain, peg tube Restrictions Weight Bearing Restrictions: No   Pertinent Vitals/Pain Does c/o 8/10 abdominal pain, distracted her and she said it felt ok    Mobility  Bed Mobility Bed Mobility: Not assessed Transfers Transfers: Sit to Stand;Stand to Sit Sit to Stand: 6: Modified independent (Device/Increase time) Stand to Sit: 6: Modified independent (Device/Increase time) Ambulation/Gait Ambulation/Gait Assistance: 5: Supervision Ambulation Distance (Feet): 810 Feet Assistive device: None Ambulation/Gait Assistance Details: flexed trunk, gaurding for safety as pt does fatigue easy and needs cueing for  self monitoring and rest periods, tends to push herself hard Gait Pattern: Step-through pattern;Trunk flexed Stairs: No    Exercises General Exercises - Lower Extremity Hip ABduction/ADduction: AROM;Both;Standing;10 reps Toe Raises: AROM;Both;20 reps;Seated Heel Raises: AROM;Both;20 reps;Seated Other Exercises Other Exercises: standing hip extension x10 bilaterally    PT Goals (current goals can now be found in the care plan section)    Visit Information  Last PT Received On: 11/28/12 Assistance Needed: +1 History of Present Illness: 67 year old female with PMH of COPD and multiple abdominal surgeries who presents to the ICU post abdominal surgery due to 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      Subjective Data      Cognition  Cognition Arousal/Alertness: Awake/alert Behavior During Therapy: WFL for tasks assessed/performed Overall Cognitive Status: Within Functional Limits for tasks assessed    Balance     End of Session PT - End of Session Equipment Utilized During Treatment: Gait belt Activity Tolerance: Patient tolerated treatment well Patient left: in chair;with call bell/phone within reach Nurse Communication: Mobility status   GP     Ludger Nutting 11/28/2012, 2:53 PM

## 2012-11-29 LAB — CBC
HCT: 28.6 % — ABNORMAL LOW (ref 36.0–46.0)
MCV: 77.5 fL — ABNORMAL LOW (ref 78.0–100.0)
WBC: 15 10*3/uL — ABNORMAL HIGH (ref 4.0–10.5)

## 2012-11-29 LAB — BASIC METABOLIC PANEL
CO2: 25 mEq/L (ref 19–32)
Chloride: 103 mEq/L (ref 96–112)
GFR calc non Af Amer: >90 mL/min (ref >90)
Glucose, Bld: 113 mg/dL — ABNORMAL HIGH (ref 70–99)
Potassium: 3.7 mEq/L (ref 3.5–5.1)
Sodium: 137 mEq/L (ref 135–145)

## 2012-11-29 MED ORDER — DIPHENHYDRAMINE HCL 12.5 MG/5ML PO ELIX
12.5000 mg | ORAL_SOLUTION | Freq: Four times a day (QID) | ORAL | Status: DC | PRN
  Administered 2012-11-29: 25 mg via ORAL
  Filled 2012-11-29: qty 10

## 2012-11-29 NOTE — Progress Notes (Signed)
Patient complained of nausea and abdominal fullness. Vital feeding going at 55cc/hour per J-tube. Unable to aspirate any residual from JTube. Will hold feeding for a couple of hours. Will continue to monitor. KYoung RN

## 2012-11-29 NOTE — Progress Notes (Signed)
Agree with A&P of WJ,PA. Little to add, patient seems slowly improving

## 2012-11-29 NOTE — Progress Notes (Signed)
18 Days Post-Op  Subjective: She complains of itching, and wants to go home.  Wants to know if she will have to go to wound clinic.  Dressing changed this AM and it has green staining on it with green colored fluid at top of incision.  It is the same color as the fluid that has been draining into the IR percutaneous drain.  Objective: Vital signs in last 24 hours: Temp:  [97.4 F (36.3 C)-99 F (37.2 C)] 97.6 F (36.4 C) (08/09 0540) Pulse Rate:  [63-112] 63 (08/09 0540) Resp:  [18-20] 18 (08/09 0540) BP: (106-120)/(57-66) 117/66 mmHg (08/09 0540) SpO2:  [93 %-100 %] 93 % (08/09 0540) Last BM Date: 11/27/12 1265 per feeding jejunostomy, +BM, Afebrile, VSS, WBC staying around 15K,  H/H stable Intake/Output from previous day:   Intake/Output this shift:    General appearance: alert, cooperative, cachectic and no distress Resp: clear to auscultation bilaterally Cardio: regular, but tachycardic GI: soft, few BS, Open wound is stable, she has some sutures showing, she has green colored fluid at the top of her incision and staining of the open wound with some greeish colored fluid. J-tube and drain appear to be ok..  Some mild cellulitis around drain site.  Lab Results:   Recent Labs  11/28/12 0610 11/29/12 0500  WBC 15.5* 15.0*  HGB 9.5* 9.5*  HCT 29.9* 28.6*  PLT 667* 650*    BMET  Recent Labs  11/28/12 0610 11/29/12 0500  NA 137 137  K 3.8 3.7  CL 101 103  CO2 26 25  GLUCOSE 108* 113*  BUN 13 13  CREATININE 0.47* 0.47*  CALCIUM 9.3 9.3   PT/INR No results found for this basename: LABPROT, INR,  in the last 72 hours   Recent Labs Lab 11/24/12 0500  AST 17  ALT 10  ALKPHOS 104  BILITOT 0.2*  PROT 6.9  ALBUMIN 1.7*     Lipase     Component Value Date/Time   LIPASE 23 11/09/2012 1520     Studies/Results: No results found.  Medications: . ascorbic acid  500 mg Per Tube Daily  . fentaNYL  100 mcg Transdermal Q72H  . FLUoxetine  20 mg Per Tube  Daily  . micafungin (MYCAMINE) IV  100 mg Intravenous Q24H  . multivitamin  5 mL Per Tube Daily  . pantoprazole sodium  40 mg Per Tube QHS  . rivaroxaban  20 mg Oral Q supper  . sodium chloride  10-40 mL Intracatheter Q12H  . zinc sulfate  220 mg Per Tube Daily    Assessment/Plan 1 closed loop small bowel obstruction with infarcted small bowel #2 gastrocutaneous fistula EXPLORATORY LAPAROTOMY, SMALL BOWEL RESECTION,  APPLICATION OF WOUND VAC, PLACEMENT OF FEEDING JEJUNOSTOMY TUBE, LYSIS OF ADHESION, 11/11/2012,  Cynthia Arms. Tsuei, MD. Fistula drain RUQ upsized 11/19/12. COPD (chronic obstructive pulmonary disease)  Pneumonia  12-2011  GERD (gastroesophageal reflux disease)  Headache(784.0)  Arthritis  osteoarthritis  Allergy  Depression  Neuromuscular disorder  Fibromyalgia  PCM  Plan: She is off antibiotics, but on the micafungin day 7, ongoing WBC elevation, but no fever.  Continue TF, wound care.  Nothing new to add now.     LOS: 20 days    Tayvin Preslar 11/29/2012

## 2012-11-30 LAB — CBC
HCT: 30.4 % — ABNORMAL LOW (ref 36.0–46.0)
Hemoglobin: 10 g/dL — ABNORMAL LOW (ref 12.0–15.0)
MCHC: 32.9 g/dL (ref 30.0–36.0)
WBC: 13.6 10*3/uL — ABNORMAL HIGH (ref 4.0–10.5)

## 2012-11-30 MED ORDER — ENOXAPARIN SODIUM 60 MG/0.6ML ~~LOC~~ SOLN
50.0000 mg | Freq: Two times a day (BID) | SUBCUTANEOUS | Status: DC
  Administered 2012-11-30 – 2012-12-05 (×10): 50 mg via SUBCUTANEOUS
  Filled 2012-11-30 (×13): qty 0.6

## 2012-11-30 NOTE — Progress Notes (Addendum)
ANTICOAGULATION CONSULT NOTE - Follow Up Consult  Pharmacy Consult: Xarelto Indication:  History of PE  Allergies  Allergen Reactions  . Avelox (Moxifloxacin Hcl In Nacl) Nausea And Vomiting  . Betadine (Povidone Iodine) Itching and Rash  . Alendronate Sodium Nausea And Vomiting  . Aspirin Nausea Only  . Codeine Nausea And Vomiting  . Doxycycline     Pt doesn't remember reaction  . Fluconazole     Pt doesn't remember reaction  . Hydrocodone Nausea And Vomiting    GI distress  . Neurontin (Gabapentin) Other (See Comments)    Mood changes   . Nsaids Other (See Comments)    Severe gastritis & perforation - avoid NSAIDs when possible  . Sertraline Hcl Other (See Comments)    Hallucinations   . Sulfa Antibiotics Rash    Patient Measurements: Height: 5' 4.96" (165 cm) Weight: 104 lb 15 oz (47.6 kg) IBW/kg (Calculated) : 56.91  Vital Signs: Temp: 98.7 F (37.1 C) (08/10 0547) Temp src: Oral (08/10 0547) BP: 119/72 mmHg (08/10 0547) Pulse Rate: 87 (08/10 0547)  Labs:  Recent Labs  11/28/12 0610 11/29/12 0500 11/30/12 0515  HGB 9.5* 9.5* 10.0*  HCT 29.9* 28.6* 30.4*  PLT 667* 650* 722*  CREATININE 0.47* 0.47*  --     Estimated Creatinine Clearance: 52.7 ml/min (by C-G formula based on Cr of 0.47).   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  Patient's renal function and CBC has been stable.  No bleeding reported.   **Xarelto has been being crushed and put down J-tube. This medication cannot be administered via a J-tube due to studies that found that absorption of rivaroxaban is dependent on the site of drug release in the GI tract. A 29% and 56% decrease in AUC and Cmax compared to tablet was reported when rivaroxaban granulate is released in the proximal small intestine. Exposure is further reduced when drug is released in the distal small intestine, or ascending colon. Avoid administration of rivaroxaban distal to the  stomach which can result in reduced absorption and related drug exposure.** Spoke with Zola Button, PA for [facility] regarding above and he will address with MD on rounds.  Goal of Therapy:  Full anticoagulation Monitor platelets by anticoagulation protocol: Yes    Plan:  1) Continue Xarelto 20mg  daily - per manufacturing guidelines and due to decreased Xarelto absorption, this med should not be crushed and give via J-tube 2) With bilateral PE (April 2014), pt should be switched to coumadin via J-tube so that INR can be monitored and we can assure that pt is being adequately anticoagulated. If wish to switch to coumadin, may need bridging with Lovenox as it will take a few days for coumadin to become therapeutic. At d/c, INR can be monitored by homehealth. 3) Monitor renal fxn, CBC, s/sx of bleeding, decision re: longterm anticoagulation  Christoper Fabian, PharmD, BCPS Clinical pharmacist, pager [phone] 11/30/2012 9:05 AM   Addendum: Zola Button, PA d/c Xarelto for now and would like Lovenox per pharmacy for h/o b/l PE. He will let Dr. [name] decide about starting coumadin tomorrow.  Plan: 1) Lovenox 50mg  SQ q12h. First dose tonight at 1800 (when next dose of Xarelto due). CBC q72h while on lovenox. 2) Will f/u longterm anticoagulation plans with Dr. [name] tomorrow.  Christoper Fabian, PharmD, BCPS Clinical pharmacist, pager [phone] 11/30/2012   10:58 AM

## 2012-11-30 NOTE — Progress Notes (Signed)
Agree with A&P of WJ,PA. No real change.

## 2012-11-30 NOTE — Progress Notes (Signed)
19 Days Post-Op  Subjective: Complaining of pain this AM.  Still leaking.  No real change  Objective: Vital signs in last 24 hours: Temp:  [98.3 F (36.8 C)-98.8 F (37.1 C)] 98.7 F (37.1 C) (08/10 0547) Pulse Rate:  [87-105] 87 (08/10 0547) Resp:  [18-20] 18 (08/10 0547) BP: (103-119)/(59-72) 119/72 mmHg (08/10 0547) SpO2:  [94 %-100 %] 100 % (08/10 0547) Last BM Date: 11/29/12 1815 J tube feeding Afebrile, VSS Labs are stable Intake/Output from previous day: 08/09 0701 - 08/10 0700 In: 3465 [I.V.:1320; NG/GT:1815; IV Piggyback:300] Out: 710 [Drains:710] Intake/Output this shift:    General appearance: alert, cooperative and no distress Resp: clear to auscultation bilaterally GI: soft, tender, few Bs, tolerating tube feeds.  Still had green colored drainage from the wound, and percutaneous drain.  Lab Results:   Recent Labs  11/29/12 0500 11/30/12 0515  WBC 15.0* 13.6*  HGB 9.5* 10.0*  HCT 28.6* 30.4*  PLT 650* 722*    BMET  Recent Labs  11/28/12 0610 11/29/12 0500  NA 137 137  K 3.8 3.7  CL 101 103  CO2 26 25  GLUCOSE 108* 113*  BUN 13 13  CREATININE 0.47* 0.47*  CALCIUM 9.3 9.3   PT/INR No results found for this basename: LABPROT, INR,  in the last 72 hours   Recent Labs Lab 11/24/12 0500  AST 17  ALT 10  ALKPHOS 104  BILITOT 0.2*  PROT 6.9  ALBUMIN 1.7*     Lipase     Component Value Date/Time   LIPASE 23 11/09/2012 1520     Studies/Results: No results found.  Medications: . ascorbic acid  500 mg Per Tube Daily  . fentaNYL  100 mcg Transdermal Q72H  . FLUoxetine  20 mg Per Tube Daily  . micafungin (MYCAMINE) IV  100 mg Intravenous Q24H  . multivitamin  5 mL Per Tube Daily  . pantoprazole sodium  40 mg Per Tube QHS  . rivaroxaban  20 mg Oral Q supper  . sodium chloride  10-40 mL Intracatheter Q12H    Assessment/Plan Closed loop small bowel obstruction with infarcted small bowel #2 gastrocutaneous fistula  EXPLORATORY  LAPAROTOMY, SMALL BOWEL RESECTION, APPLICATION OF WOUND VAC, PLACEMENT OF FEEDING JEJUNOSTOMY TUBE,  LYSIS OF ADHESION, 11/11/2012, [name]. Tsuei, MD.  Fistula drain RUQ upsized 11/19/12.  COPD (chronic obstructive pulmonary disease)  Pneumonia  12-2011  Bilateral PE in April on Xarelto for anticoagulation. GERD (gastroesophageal reflux disease)  Headache(784.0)  Arthritis  osteoarthritis  Allergy  Depression  Neuromuscular disorder  Fibromyalgia  PCM  Plan: Continue current management.  Pharmacy notes Xarelto is poorly absorbed in jejunal tube feeds.  They said we could try coumadin.  I will hold xarelto for now and convert to Lovenox till Dr. [name] can make a decision tomorrow.      LOS: 21 days    Cynthia Bowen 11/30/2012

## 2012-12-01 DIAGNOSIS — K560 Paralytic ileus: Secondary | ICD-10-CM

## 2012-12-01 LAB — CBC
Hemoglobin: 9.8 g/dL — ABNORMAL LOW (ref 12.0–15.0)
MCH: 25.9 pg — ABNORMAL LOW (ref 26.0–34.0)
MCV: 78.6 fL (ref 78.0–100.0)
RBC: 3.78 MIL/uL — ABNORMAL LOW (ref 3.87–5.11)

## 2012-12-01 MED ORDER — COUMADIN BOOK
Freq: Once | Status: AC
  Administered 2012-12-01: 18:00:00
  Filled 2012-12-01: qty 1

## 2012-12-01 MED ORDER — WARFARIN VIDEO: Freq: Once | Status: DC

## 2012-12-01 MED ORDER — WARFARIN - PHARMACIST DOSING INPATIENT
Freq: Every day | Status: DC
  Administered 2012-12-01 – 2012-12-16 (×5)

## 2012-12-01 MED ORDER — WARFARIN SODIUM 5 MG PO TABS
5.0000 mg | ORAL_TABLET | Freq: Once | ORAL | Status: AC
  Administered 2012-12-01: 5 mg via ORAL
  Filled 2012-12-01: qty 1

## 2012-12-01 NOTE — Progress Notes (Signed)
ANTICOAGULATION CONSULT NOTE - Follow Up Consult  Pharmacy Consult: warfarin Indication:  History of PE  Allergies  Allergen Reactions  . Avelox (Moxifloxacin Hcl In Nacl) Nausea And Vomiting  . Betadine (Povidone Iodine) Itching and Rash  . Alendronate Sodium Nausea And Vomiting  . Aspirin Nausea Only  . Codeine Nausea And Vomiting  . Doxycycline     Pt doesn't remember reaction  . Fluconazole     Pt doesn't remember reaction  . Hydrocodone Nausea And Vomiting    GI distress  . Neurontin (Gabapentin) Other (See Comments)    Mood changes   . Nsaids Other (See Comments)    Severe gastritis & perforation - avoid NSAIDs when possible  . Sertraline Hcl Other (See Comments)    Hallucinations   . Sulfa Antibiotics Rash    Patient Measurements: Height: 5' 4.96" (165 cm) Weight: 104 lb 15 oz (47.6 kg) IBW/kg (Calculated) : 56.91  Vital Signs: Temp: 97.6 F (36.4 C) (08/11 0551) BP: 120/72 mmHg (08/11 0551) Pulse Rate: 102 (08/11 0551)  Labs:  Recent Labs  11/29/12 0500 11/30/12 0515 12/01/12 0455 12/01/12 0800  HGB 9.5* 10.0* 9.8*  --   HCT 28.6* 30.4* 29.7*  --   PLT 650* 722* 687*  --   LABPROT  --   --   --  14.2  INR  --   --   --  1.12  CREATININE 0.47*  --   --   --     Estimated Creatinine Clearance: 52.7 ml/min (by C-G formula based on Cr of 0.47).  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 However, xarelto cannot be administered via a J-tube due inadequate absorption based on site of drug release. Was placed on therapeutic lovenox and it now starting coumadin. Last xarelto dose was 8/9. INR today is normal at 1.12, H/H 9.8/29.7, plts 687. No bleeding noted.   Goal of Therapy:  INR 2-3  Monitor platelets by anticoagulation protocol: Yes   Plan:  1. Warfarin 5mg  PO x 1 tonight 2. Daily INR 3. Coumadin book + video to patient  Lysle Pearl, PharmD, BCPS Pager # [phone] 12/01/2012 1:48  PM

## 2012-12-01 NOTE — Progress Notes (Signed)
PT Cancellation Note  Patient Details Name: Cynthia Bowen MRN: [id number] DOB: 03/12/1946   Cancelled Treatment:    Reason Eval/Treat Not Completed: Pain limiting ability to participate.  Pt notes feeling worse today.  Left HEP.  Will f/u another time.     Sunny Schlein, [place] [phone] 12/01/2012, 1:00 PM

## 2012-12-01 NOTE — Progress Notes (Signed)
20 Days Post-Op  Subjective: Patient resting comfortably.  No complaints Will need to switch to Coumadin, since Pharmacy has alerted [place] that Xarelto may not work as well when administered via j-tube. Pain regimen seems to be working.  Objective: Vital signs in last 24 hours: Temp:  [97.3 F (36.3 C)-97.8 F (36.6 C)] 97.6 F (36.4 C) (08/11 0551) Pulse Rate:  [102-109] 102 (08/11 0551) Resp:  [16-18] 16 (08/11 0551) BP: (105-120)/(59-72) 120/72 mmHg (08/11 0551) SpO2:  [99 %-100 %] 99 % (08/11 0551) Last BM Date: 11/30/12  Intake/Output from previous day: 08/10 0701 - 08/11 0700 In: 2237.5 [I.V.:960; NG/GT:1127.5] Out: 380 [Drains:380] Intake/Output this shift:    General appearance: alert, cooperative and no distress GI: less fistula output Drain - with greenish output Wound granulating very well; some fascia exposed Lab Results:   Recent Labs  11/30/12 0515 12/01/12 0455  WBC 13.6* 12.8*  HGB 10.0* 9.8*  HCT 30.4* 29.7*  PLT 722* 687*   BMET  Recent Labs  11/29/12 0500  NA 137  K 3.7  CL 103  CO2 25  GLUCOSE 113*  BUN 13  CREATININE 0.47*  CALCIUM 9.3   PT/INR No results found for this basename: LABPROT, INR,  in the last 72 hours ABG No results found for this basename: PHART, PCO2, PO2, HCO3,  in the last 72 hours  Studies/Results: No results found.  Anti-infectives: Anti-infectives   Start     Dose/Rate Route Frequency Ordered Stop   11/22/12 2200  micafungin (MYCAMINE) 100 mg in sodium chloride 0.9 % 100 mL IVPB     100 mg 100 mL/hr over 1 Hours Intravenous Every 24 hours 11/22/12 2103 12/02/12 2159   11/20/12 0730  ertapenem (INVANZ) 1 g in sodium chloride 0.9 % 50 mL IVPB     1 g 100 mL/hr over 30 Minutes Intravenous Every 24 hours 11/20/12 0709 11/29/12 0653   11/10/12 1000  piperacillin-tazobactam (ZOSYN) IVPB 3.375 g  Status:  Discontinued     3.375 g 12.5 mL/hr over 240 Minutes Intravenous 3 times per day 11/10/12 0937 11/20/12 0709       Assessment/Plan: s/p Procedure(s): EXPLORATORY LAPAROTOMY (N/A) SMALL BOWEL RESECTION (N/A) APPLICATION OF WOUND VAC (N/A) PLACEMENT OF FEEDING JEJUNOSTOMY TUBE (N/A) LYSIS OF ADHESION (N/A) Start Coumadin per pharmacy via j-tube Home in next few days with home health for tube feeds, dressing changes, drain care Home on Coumadin Home with drain   LOS: 22 days    Prajwal Fellner K. 12/01/2012

## 2012-12-01 NOTE — Progress Notes (Signed)
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  S/P exploratory laparotomy with lysis of adhesions, small bowel resection, and application of wound VAC 7/22. CT aspiration of abdominal fluid collection 7/31.   Vital AF 1.2 formula infusing at goal rate of 55 ml/hr via J-tube providing 1584 kcals, 99 gm protein, 1070 ml of free water.  Noted held 8/9 due patient c/o nausea and fullness.  Tolerating well at this time.  Hopefully home in next few days with [facility].  Height: Ht Readings from Last 1 Encounters:  11/10/12 5' 4.96" (1.65 m)    Weight Status:   Wt Readings from Last 1 Encounters:  11/16/12 104 lb 15 oz (47.6 kg)    Body mass index is 17.48 kg/(m^2).  Re-estimated needs:  Kcal: 1500-1700 Protein: 80-95 gm Fluid: 1.5-1.7 L  Skin: bilateral abdominal incision   Diet Order: NPO   Intake/Output Summary (Last 24 hours) at 12/01/12 1058 Last data filed at 12/01/12 0552  Gross per 24 hour  Intake 2237.5 ml  Output    380 ml  Net 1857.5 ml    Labs:   Recent Labs Lab 11/27/12 0510 11/28/12 0610 11/29/12 0500  NA 137 137 137  K 3.9 3.8 3.7  CL 102 101 103  CO2 26 26 25   BUN 13 13 13   CREATININE 0.46* 0.47* 0.47*  CALCIUM 9.2 9.3 9.3  GLUCOSE 106* 108* 113*    Scheduled Meds: . ascorbic acid  500 mg Per Tube Daily  . enoxaparin (LOVENOX) injection  50 mg Subcutaneous Q12H  . fentaNYL  100 mcg Transdermal Q72H  . FLUoxetine  20 mg Per Tube Daily  .  micafungin (MYCAMINE) IV  100 mg Intravenous Q24H  . multivitamin  5 mL Per Tube Daily  . pantoprazole sodium  40 mg Per Tube QHS  . sodium chloride  10-40 mL Intracatheter Q12H    Continuous Infusions: . sodium chloride 40 mL/hr at 11/30/12 1248  . feeding supplement (VITAL AF 1.2 CAL) 1,000 mL (12/01/12 0909)    Maureen Chatters, RD, LDN Pager #: [phone] After-Hours Pager #: [phone]

## 2012-12-02 LAB — CBC
Hemoglobin: 10.4 g/dL — ABNORMAL LOW (ref 12.0–15.0)
MCH: 25.8 pg — ABNORMAL LOW (ref 26.0–34.0)
MCV: 78.7 fL (ref 78.0–100.0)
RBC: 4.03 MIL/uL (ref 3.87–5.11)

## 2012-12-02 MED ORDER — WARFARIN SODIUM 5 MG PO TABS
5.0000 mg | ORAL_TABLET | Freq: Once | ORAL | Status: AC
  Administered 2012-12-02: 5 mg via ORAL
  Filled 2012-12-02: qty 1

## 2012-12-02 MED ORDER — VITAL AF 1.2 CAL PO LIQD
1000.0000 mL | ORAL | Status: DC
  Filled 2012-12-02 (×2): qty 1000

## 2012-12-02 NOTE — Progress Notes (Signed)
ANTICOAGULATION CONSULT NOTE - Follow Up Consult  Pharmacy Consult: warfarin Indication:  History of PE  Allergies  Allergen Reactions  . Avelox (Moxifloxacin Hcl In Nacl) Nausea And Vomiting  . Betadine (Povidone Iodine) Itching and Rash  . Alendronate Sodium Nausea And Vomiting  . Aspirin Nausea Only  . Codeine Nausea And Vomiting  . Doxycycline     Pt doesn't remember reaction  . Fluconazole     Pt doesn't remember reaction  . Hydrocodone Nausea And Vomiting    GI distress  . Neurontin (Gabapentin) Other (See Comments)    Mood changes   . Nsaids Other (See Comments)    Severe gastritis & perforation - avoid NSAIDs when possible  . Sertraline Hcl Other (See Comments)    Hallucinations   . Sulfa Antibiotics Rash    Patient Measurements: Height: 5' 4.96" (165 cm) Weight: 104 lb 15 oz (47.6 kg) IBW/kg (Calculated) : 56.91  Vital Signs: Temp: 97.6 F (36.4 C) (08/12 0531) Temp src: Oral (08/11 2136) BP: 139/74 mmHg (08/12 0531) Pulse Rate: 101 (08/12 0531)  Labs:  Recent Labs  11/30/12 0515 12/01/12 0455 12/01/12 0800 12/02/12 0545  HGB 10.0* 9.8*  --  10.4*  HCT 30.4* 29.7*  --  31.7*  PLT 722* 687*  --  798*  LABPROT  --   --  14.2 15.1  INR  --   --  1.12 1.22    Estimated Creatinine Clearance: 52.7 ml/min (by C-G formula based on Cr of 0.47).  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 However, xarelto cannot be administered via a J-tube due inadequate absorption based on site of drug release. Was placed on therapeutic lovenox and it now starting coumadin. Last xarelto dose was 8/9. INR is 1.22 and CBC is stable. No bleeding noted.   Goal of Therapy:  INR 2-3  Monitor platelets by anticoagulation protocol: Yes   Plan:  1. Repeat Warfarin 5mg  PO x 1 tonight 2. F/u AM INR  Lysle Pearl, PharmD, BCPS Pager # [phone] 12/02/2012 8:55 AM

## 2012-12-02 NOTE — Progress Notes (Signed)
Physical Therapy Treatment Patient Details Name: KIARRA KIDD MRN: [id number] DOB: [birth year] Today's Date: 12/02/2012 Time: 4098-1191 PT Time Calculation (min): 27 min  PT Assessment / Plan / Recommendation  History of Present Illness 67 year old female with PMH of COPD and multiple abdominal surgeries who presents to the ICU post abdominal surgery due to 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     PT Comments   Pt continues to be weak and states "I don't feel well."  However pt willing to work with therapy and continues to show progress towards goals.  Updated date on goals and goals remained appropriate at this time.   Follow Up Recommendations  Home health PT;Supervision - Intermittent     Equipment Recommendations  None recommended by PT    Frequency Min 3X/week   Progress towards PT Goals Progress towards PT goals: Progressing toward goals (goals updated)  Plan Current plan remains appropriate    Precautions / Restrictions Precautions Precautions: None Precaution Comments: jp drain, peg tube Restrictions Weight Bearing Restrictions: No   Pertinent Vitals/Pain 10/10 abdominal pain; premedicated; however willing to work    Mobility  Bed Mobility Bed Mobility: Supine to Sit Supine to Sit: 7: Independent Transfers Transfers: Sit to Stand;Stand to Sit Sit to Stand: 6: Modified independent (Device/Increase time) Stand to Sit: 6: Modified independent (Device/Increase time) Ambulation/Gait Ambulation/Gait Assistance: 5: Supervision;4: Min guard Ambulation Distance (Feet): 800 Feet Assistive device: None Ambulation/Gait Assistance Details: minguard for safety Gait Pattern: Step-through pattern Gait velocity: decreased Stairs: No Wheelchair Mobility Wheelchair Mobility: No    Exercises General Exercises - Lower Extremity Long Arc Quad: AROM;20 reps;Seated Hip ABduction/ADduction: AROM;Both;Standing;10 reps Straight  Leg Raises: Strengthening;Both;10 reps Mini-Sqauts: Strengthening;10 reps   PT Diagnosis:    PT Problem List:   PT Treatment Interventions:     PT Goals (current goals can now be found in the care plan section) Acute Rehab PT Goals Patient Stated Goal: return home PT Goal Formulation: With patient Time For Goal Achievement: 12/09/12 Potential to Achieve Goals: Good  Visit Information  Last PT Received On: 12/02/12 Assistance Needed: +1 History of Present Illness: 67 year old female with PMH of COPD and multiple abdominal surgeries who presents to the ICU post abdominal surgery due to 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      Subjective Data  Subjective: "I'm still not feeling well but I'll work with you." Patient Stated Goal: return home   Cognition  Cognition Arousal/Alertness: Awake/alert Behavior During Therapy: WFL for tasks assessed/performed Overall Cognitive Status: Within Functional Limits for tasks assessed    Balance     End of Session PT - End of Session Equipment Utilized During Treatment: Gait belt Activity Tolerance: Patient tolerated treatment well Patient left: in chair;with call bell/phone within reach Nurse Communication: Mobility status   GP     Nadeem Romanoski 12/02/2012, 8:40 AM   Jake Shark, PT DPT [phone]

## 2012-12-02 NOTE — Progress Notes (Signed)
21 Days Post-Op  Subjective: Patient feels more abdominal pain/ distention today - generalized Had one episode of nausea/ vomiting last night Had two bowel movements yesterday Has been on goal rate on tube feeds for a couple of weeks already Large amount of greenish drainage from perc drain in RUQ; some drainage around drain insertion site  Objective: Vital signs in last 24 hours: Temp:  [97.6 F (36.4 C)-98.8 F (37.1 C)] 97.6 F (36.4 C) (08/12 0531) Pulse Rate:  [93-109] 101 (08/12 0531) Resp:  [16-17] 17 (08/12 0531) BP: (119-139)/(68-74) 139/74 mmHg (08/12 0531) SpO2:  [100 %] 100 % (08/12 0531) Last BM Date: 12/01/12  Intake/Output from previous day: 08/11 0701 - 08/12 0700 In: 1050 [I.V.:940] Out: 904 [Urine:2; Drains:900; Stool:2] Intake/Output this shift:    General appearance: alert, cooperative and no distress Resp: clear to auscultation bilaterally Cardio: regular rate and rhythm, S1, S2 normal, no murmur, click, rub or gallop GI: mildly distended; + bowel sounds; RUQ drain site with some greenish drainage oozing around drainage tube Midline wound - granulating very nicely; very little fistula output at upper part of incision  Lab Results:   Recent Labs  12/01/12 0455 12/02/12 0545  WBC 12.8* 21.2*  HGB 9.8* 10.4*  HCT 29.7* 31.7*  PLT 687* 798*   BMET No results found for this basename: NA, K, CL, CO2, GLUCOSE, BUN, CREATININE, CALCIUM,  in the last 72 hours PT/INR  Recent Labs  12/01/12 0800 12/02/12 0545  LABPROT 14.2 15.1  INR 1.12 1.22   ABG No results found for this basename: PHART, PCO2, PO2, HCO3,  in the last 72 hours  Studies/Results: No results found.  Anti-infectives: Anti-infectives   Start     Dose/Rate Route Frequency Ordered Stop   11/22/12 2200  micafungin (MYCAMINE) 100 mg in sodium chloride 0.9 % 100 mL IVPB     100 mg 100 mL/hr over 1 Hours Intravenous Every 24 hours 11/22/12 2103 12/01/12 2215   11/20/12 0730   ertapenem (INVANZ) 1 g in sodium chloride 0.9 % 50 mL IVPB     1 g 100 mL/hr over 30 Minutes Intravenous Every 24 hours 11/20/12 0709 11/29/12 0653   11/10/12 1000  piperacillin-tazobactam (ZOSYN) IVPB 3.375 g  Status:  Discontinued     3.375 g 12.5 mL/hr over 240 Minutes Intravenous 3 times per day 11/10/12 0937 11/20/12 0709      Assessment/Plan: s/p Procedure(s): EXPLORATORY LAPAROTOMY (N/A) SMALL BOWEL RESECTION (N/A) APPLICATION OF WOUND VAC (N/A) PLACEMENT OF FEEDING JEJUNOSTOMY TUBE (N/A) LYSIS OF ADHESION (N/A) Elevated WBC after stopping antibiotics/ antifungal  Abd distention/ nausea  Will temporarily back off tube feed rate to see if this is contributing to her abdominal pain and nausea. If WBC increases, she will need another CT scan to evaluate for further intra-abdominal abscess    LOS: 23 days    Shawndale Kilpatrick K. 12/02/2012

## 2012-12-03 ENCOUNTER — Inpatient Hospital Stay (HOSPITAL_COMMUNITY): Payer: Medicare Other

## 2012-12-03 LAB — CBC
Hemoglobin: 9.3 g/dL — ABNORMAL LOW (ref 12.0–15.0)
MCH: 25.6 pg — ABNORMAL LOW (ref 26.0–34.0)
MCV: 78.2 fL (ref 78.0–100.0)
RBC: 3.63 MIL/uL — ABNORMAL LOW (ref 3.87–5.11)

## 2012-12-03 LAB — PROTIME-INR: Prothrombin Time: 17.3 seconds — ABNORMAL HIGH (ref 11.6–15.2)

## 2012-12-03 MED ORDER — PANTOPRAZOLE SODIUM 40 MG IV SOLR
40.0000 mg | Freq: Every day | INTRAVENOUS | Status: DC
  Administered 2012-12-03 – 2012-12-17 (×15): 40 mg via INTRAVENOUS
  Filled 2012-12-03 (×23): qty 40

## 2012-12-03 MED ORDER — VITAL AF 1.2 CAL PO LIQD
1000.0000 mL | ORAL | Status: DC
  Administered 2012-12-03: 1000 mL
  Filled 2012-12-03 (×4): qty 1000

## 2012-12-03 MED ORDER — METOCLOPRAMIDE HCL 5 MG/ML IJ SOLN
10.0000 mg | Freq: Four times a day (QID) | INTRAMUSCULAR | Status: DC
  Administered 2012-12-03 – 2012-12-18 (×58): 10 mg via INTRAVENOUS
  Filled 2012-12-03 (×26): qty 2
  Filled 2012-12-03: qty 4
  Filled 2012-12-03 (×57): qty 2

## 2012-12-03 MED ORDER — WARFARIN SODIUM 5 MG PO TABS
5.0000 mg | ORAL_TABLET | Freq: Once | ORAL | Status: AC
  Administered 2012-12-03: 5 mg via ORAL
  Filled 2012-12-03: qty 1

## 2012-12-03 NOTE — Progress Notes (Signed)
ANTICOAGULATION CONSULT NOTE - Follow Up Consult  Pharmacy Consult: warfarin Indication:  History of PE  Allergies  Allergen Reactions  . Avelox [Moxifloxacin Hcl In Nacl] Nausea And Vomiting  . Betadine [Povidone Iodine] Itching and Rash  . Alendronate Sodium Nausea And Vomiting  . Aspirin Nausea Only  . Codeine Nausea And Vomiting  . Doxycycline     Pt doesn't remember reaction  . Fluconazole     Pt doesn't remember reaction  . Hydrocodone Nausea And Vomiting    GI distress  . Neurontin [Gabapentin] Other (See Comments)    Mood changes   . Nsaids Other (See Comments)    Severe gastritis & perforation - avoid NSAIDs when possible  . Sertraline Hcl Other (See Comments)    Hallucinations   . Sulfa Antibiotics Rash    Patient Measurements: Height: 5' 4.96" (165 cm) Weight: 104 lb 15 oz (47.6 kg) IBW/kg (Calculated) : 56.91  Vital Signs: Temp: 97.9 F (36.6 C) (08/13 0617) Temp src: Oral (08/13 0617) BP: 137/71 mmHg (08/13 0617) Pulse Rate: 100 (08/13 0617)  Labs:  Recent Labs  12/01/12 0455 12/01/12 0800 12/02/12 0545 12/03/12 0534  HGB 9.8*  --  10.4* 9.3*  HCT 29.7*  --  31.7* 28.4*  PLT 687*  --  798* 665*  LABPROT  --  14.2 15.1 17.3*  INR  --  1.12 1.22 1.45    Estimated Creatinine Clearance: 52.7 ml/min (by C-G formula based on Cr of 0.47).  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 However, xarelto cannot be administered via a J-tube due inadequate absorption based on site of drug release. Was placed on therapeutic lovenox and it now starting coumadin. Last xarelto dose was 8/9. INR is 1.45 and trending up nicely. CBC is stable. No bleeding noted.   Goal of Therapy:  INR 2-3  Monitor platelets by anticoagulation protocol: Yes   Plan:  1. Repeat Warfarin 5mg  PO x 1 tonight 2. F/u AM INR  Lysle Pearl, PharmD, BCPS Pager # [phone] 12/03/2012 9:00 AM

## 2012-12-03 NOTE — Progress Notes (Signed)
22 Days Post-Op  Subjective: Feels much better after turning down tube feed rate.   + BM WBC back down to 13 Less pain med requirement On Coumadin per pharmacy  Objective: Vital signs in last 24 hours: Temp:  [97.9 F (36.6 C)-99.3 F (37.4 C)] 97.9 F (36.6 C) (08/13 0617) Pulse Rate:  [100-109] 100 (08/13 0617) Resp:  [17-18] 18 (08/13 0617) BP: (124-137)/(51-71) 137/71 mmHg (08/13 0617) SpO2:  [96 %-100 %] 99 % (08/13 0617) Last BM Date: 12/02/12  Intake/Output from previous day: 08/12 0701 - 08/13 0700 In: -  Out: 125 [Drains:125] Intake/Output this shift:    General appearance: alert, cooperative and no distress Resp: clear to auscultation bilaterally Cardio: regular rate and rhythm, S1, S2 normal, no murmur, click, rub or gallop GI: soft, less distended; + bowel sounds Greenish drain output - decreased volume Lab Results:   Recent Labs  12/02/12 0545 12/03/12 0534  WBC 21.2* 13.3*  HGB 10.4* 9.3*  HCT 31.7* 28.4*  PLT 798* 665*   BMET No results found for this basename: NA, K, CL, CO2, GLUCOSE, BUN, CREATININE, CALCIUM,  in the last 72 hours PT/INR  Recent Labs  12/02/12 0545 12/03/12 0534  LABPROT 15.1 17.3*  INR 1.22 1.45   ABG No results found for this basename: PHART, PCO2, PO2, HCO3,  in the last 72 hours  Studies/Results: No results found.  Anti-infectives: Anti-infectives   Start     Dose/Rate Route Frequency Ordered Stop   11/22/12 2200  micafungin (MYCAMINE) 100 mg in sodium chloride 0.9 % 100 mL IVPB     100 mg 100 mL/hr over 1 Hours Intravenous Every 24 hours 11/22/12 2103 12/01/12 2215   11/20/12 0730  ertapenem (INVANZ) 1 g in sodium chloride 0.9 % 50 mL IVPB     1 g 100 mL/hr over 30 Minutes Intravenous Every 24 hours 11/20/12 0709 11/29/12 0653   11/10/12 1000  piperacillin-tazobactam (ZOSYN) IVPB 3.375 g  Status:  Discontinued     3.375 g 12.5 mL/hr over 240 Minutes Intravenous 3 times per day 11/10/12 0937 11/20/12 0709       Assessment/Plan: s/p Procedure(s): EXPLORATORY LAPAROTOMY (N/A) SMALL BOWEL RESECTION (N/A) APPLICATION OF WOUND VAC (N/A) PLACEMENT OF FEEDING JEJUNOSTOMY TUBE (N/A) LYSIS OF ADHESION (N/A) Patient is off all antibiotics - WBC decreasing Increase tube feed to 40 ml/ hr - eventually will try to get back to goal rate of 55 ml/hr Coumadin for bilateral PE Hopefully home later in the week with home health RN for dressing changes (will switch to BID), tube feeds, drain care and home health PT.  LOS: 24 days    Dashea Mcmullan K. 12/03/2012

## 2012-12-03 NOTE — Progress Notes (Signed)
Received report this am from [place], [place] that patient had vomited x2 on day shift 8/12 and that tube feeding had been stopped.  On assessment, tubing for tube feed was connected to jejunostomy port, but pump was off.  Adjusted feeding to 41ml/hr per MD order.  At 1500 today, pt complained of abd distention, pain, and regurgitation of tube feeding.  Tube feeding stopped and disconnected from port per MD order.  Will continue to monitor.

## 2012-12-03 NOTE — Addendum Note (Signed)
Addendum created 12/03/12 0854 by Raiford Simmonds, MD   Modules edited: Anesthesia Responsible Staff

## 2012-12-04 LAB — CBC
Hemoglobin: 9.3 g/dL — ABNORMAL LOW (ref 12.0–15.0)
MCH: 25.5 pg — ABNORMAL LOW (ref 26.0–34.0)
MCV: 77.8 fL — ABNORMAL LOW (ref 78.0–100.0)
RBC: 3.65 MIL/uL — ABNORMAL LOW (ref 3.87–5.11)

## 2012-12-04 LAB — PROTIME-INR: Prothrombin Time: 20.4 seconds — ABNORMAL HIGH (ref 11.6–15.2)

## 2012-12-04 MED ORDER — WARFARIN SODIUM 2.5 MG PO TABS
2.5000 mg | ORAL_TABLET | Freq: Once | ORAL | Status: AC
  Administered 2012-12-04: 2.5 mg via ORAL
  Filled 2012-12-04: qty 1

## 2012-12-04 NOTE — Progress Notes (Signed)
Assisted pt oob in hall. Pt walked two full laps around unit; tolerated very well.  Pt states she's passed gas tonight but no stool. Abd remains distended and hyperactive bowel sounds noted.  Strongly encouraged ambulation and will continue to assist pt oob.  Tube feedings off and j-tube clamped.

## 2012-12-04 NOTE — Progress Notes (Signed)
Physical Therapy Treatment Patient Details Name: Cynthia Bowen MRN: [id number] DOB: 09/26/1945 Today's Date: 12/04/2012 Time: 7846-9629 PT Time Calculation (min): 23 min  PT Assessment / Plan / Recommendation  History of Present Illness 67 year old female with PMH of COPD and multiple abdominal surgeries who presents to the ICU post abdominal surgery due to 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     PT Comments   Pt making great progress and is working on high level balance during gait. Encouraged pt to walk at least 2 x a day with nursing.   Follow Up Recommendations  Home health PT;Supervision - Intermittent           Equipment Recommendations  None recommended by PT       Frequency Min 3X/week   Progress towards PT Goals Progress towards PT goals: Progressing toward goals  Plan Current plan remains appropriate    Precautions / Restrictions Precautions Precautions: None Precaution Comments: jp drain, peg tube Restrictions Weight Bearing Restrictions: No   Pertinent Vitals/Pain Faces 4/10 abdomen pain, no request for pain medication    Mobility  Bed Mobility Bed Mobility: Supine to Sit Details for Bed Mobility Assistance: in chair before and after Transfers Transfers: Sit to Stand;Stand to Sit Sit to Stand: 6: Modified independent (Device/Increase time) Stand to Sit: 6: Modified independent (Device/Increase time) Ambulation/Gait Ambulation/Gait Assistance: 5: Supervision Ambulation Distance (Feet): 1000 Feet Assistive device: None Ambulation/Gait Assistance Details: Supervision for high level balance activities Gait Pattern: Step-through pattern Gait velocity: decreased Stairs: No Wheelchair Mobility Wheelchair Mobility: No    Exercises Other Exercises Other Exercises: High level balance activities during ambulation: backwards walking, side stepping, speed changes, sudden stops, visual scanning, large head  movements (horizontal and vertical), and high knee stepping.      PT Goals (current goals can now be found in the care plan section) Acute Rehab PT Goals Patient Stated Goal: return home PT Goal Formulation: With patient Time For Goal Achievement: 12/09/12 Potential to Achieve Goals: Good  Visit Information  Last PT Received On: 12/04/12 Assistance Needed: +1 History of Present Illness: 67 year old female with PMH of COPD and multiple abdominal surgeries who presents to the ICU post abdominal surgery due to 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      Subjective Data  Patient Stated Goal: return home   Cognition  Cognition Arousal/Alertness: Awake/alert Behavior During Therapy: WFL for tasks assessed/performed Overall Cognitive Status: Within Functional Limits for tasks assessed       End of Session PT - End of Session Equipment Utilized During Treatment: Gait belt Activity Tolerance: Patient tolerated treatment well Patient left: in chair;with call bell/phone within reach   GP     Wilhemina Bonito 12/04/2012, 12:12 PM

## 2012-12-04 NOTE — Progress Notes (Signed)
ANTICOAGULATION CONSULT NOTE - Follow Up Consult  Pharmacy Consult:  Coumadin Indication:  History of PE  Allergies  Allergen Reactions  . Avelox [Moxifloxacin Hcl In Nacl] Nausea And Vomiting  . Betadine [Povidone Iodine] Itching and Rash  . Alendronate Sodium Nausea And Vomiting  . Aspirin Nausea Only  . Codeine Nausea And Vomiting  . Doxycycline     Pt doesn't remember reaction  . Fluconazole     Pt doesn't remember reaction  . Hydrocodone Nausea And Vomiting    GI distress  . Neurontin [Gabapentin] Other (See Comments)    Mood changes   . Nsaids Other (See Comments)    Severe gastritis & perforation - avoid NSAIDs when possible  . Sertraline Hcl Other (See Comments)    Hallucinations   . Sulfa Antibiotics Rash    Patient Measurements: Height: 5' 4.96" (165 cm) Weight: 104 lb 15 oz (47.6 kg) IBW/kg (Calculated) : 56.91  Vital Signs: Temp: 97.6 F (36.4 C) (08/14 0519) Temp src: Oral (08/14 0519) BP: 136/60 mmHg (08/14 0519) Pulse Rate: 81 (08/14 0519)  Labs:  Recent Labs  12/02/12 0545 12/03/12 0534 12/04/12 0445  HGB 10.4* 9.3* 9.3*  HCT 31.7* 28.4* 28.4*  PLT 798* 665* 618*  LABPROT 15.1 17.3* 20.4*  INR 1.22 1.45 1.80*    Estimated Creatinine Clearance: 52.7 ml/min (by C-G formula based on Cr of 0.47).     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 However, Xarelto cannot be administered via a J-tube due inadequate absorption based on site of drug release.  She was transitioned to Lovenox and Coumadin; last Xarelto dose was 11/29/12.  INR trending up towards goal; no bleeding reported.  Patient's renal function remains stable.   Goal of Therapy:  INR 2-3  Monitor platelets by anticoagulation protocol: Yes    Plan:  - Coumadin 2.5mg  PO today - Continue Lovenox 50mg  SQ Q12H until INR therapeutic - Daily PT / INR - CBC Q72H    Sharell Hilmer D. Laney Potash, PharmD, BCPS Pager:  [phone] 12/04/2012, 2:09 PM

## 2012-12-04 NOTE — Progress Notes (Signed)
23 Days Post-Op  Subjective: Had some abdominal distention/ nausea/ vomiting with restarting tube feeds yesterday Feels much better today Flatus, no BM today X-rays - ileus, no obstruction WBC normal Skin irritation around RUQ drain site  Objective: Vital signs in last 24 hours: Temp:  [97.6 F (36.4 C)-98.8 F (37.1 C)] 97.6 F (36.4 C) (08/14 0519) Pulse Rate:  [81-99] 81 (08/14 0519) Resp:  [16-18] 16 (08/14 0519) BP: (134-137)/(60-69) 136/60 mmHg (08/14 0519) SpO2:  [95 %-100 %] 100 % (08/14 0519) Last BM Date: 12/02/12  Intake/Output from previous day: 08/13 0701 - 08/14 0700 In: 7889.2 [I.V.:4550.4; NG/GT:3098.8] Out: 301 [Urine:1; Drains:300] Intake/Output this shift:    General appearance: alert, cooperative and no distress Resp: clear to auscultation bilaterally GI: mildly distended; + BS; redness around RUQ drain site - some skin slough Midline wound - small amount of drainage from upper midline; remainder is granulating well.  Lab Results:   Recent Labs  12/03/12 0534 12/04/12 0445  WBC 13.3* 9.4  HGB 9.3* 9.3*  HCT 28.4* 28.4*  PLT 665* 618*   BMET No results found for this basename: NA, K, CL, CO2, GLUCOSE, BUN, CREATININE, CALCIUM,  in the last 72 hours PT/INR  Recent Labs  12/03/12 0534 12/04/12 0445  LABPROT 17.3* 20.4*  INR 1.45 1.80*   ABG No results found for this basename: PHART, PCO2, PO2, HCO3,  in the last 72 hours  Studies/Results: Dg Abd Acute W/chest  12/03/2012   *RADIOLOGY REPORT*  Clinical Data: Nausea and vomiting  ACUTE ABDOMEN SERIES (ABDOMEN 2 VIEW & CHEST 1 VIEW)  Comparison: CT abdomen 11/20/2012  Findings: There is a right-sided PICC line.  Stable heart silhouette.  There is a moderate left effusion unchanged from comparison CT.  No pulmonary edema. Opacification of the left lower lobe representing atelectasis or infiltrate.  There is surgical drain in the central abdomen injecting over the sacrum.  Pigtail catheter in  the upper quadrant.  There are dilated loops of small bowel measuring up to 5 cm.  There is gas in the rectum.  There is no free air beneath hemidiaphragms.  IMPRESSION:  1.  Left effusion and atelectasis.  Cannot exclude left basilar pneumonia. 2.  Dilated loops of small bowel likely represent ileus.  Surgical drains in place.   Original Report Authenticated By: Genevive Bi, M.D.    Anti-infectives: Anti-infectives   Start     Dose/Rate Route Frequency Ordered Stop   11/22/12 2200  micafungin (MYCAMINE) 100 mg in sodium chloride 0.9 % 100 mL IVPB     100 mg 100 mL/hr over 1 Hours Intravenous Every 24 hours 11/22/12 2103 12/01/12 2215   11/20/12 0730  ertapenem (INVANZ) 1 g in sodium chloride 0.9 % 50 mL IVPB     1 g 100 mL/hr over 30 Minutes Intravenous Every 24 hours 11/20/12 0709 11/29/12 0653   11/10/12 1000  piperacillin-tazobactam (ZOSYN) IVPB 3.375 g  Status:  Discontinued     3.375 g 12.5 mL/hr over 240 Minutes Intravenous 3 times per day 11/10/12 0937 11/20/12 0709      Assessment/Plan: s/p Procedure(s): EXPLORATORY LAPAROTOMY (N/A) SMALL BOWEL RESECTION (N/A) APPLICATION OF WOUND VAC (N/A) PLACEMENT OF FEEDING JEJUNOSTOMY TUBE (N/A) LYSIS OF ADHESION (N/A) Hold tube feeds until ileus improves Start Reglan WOCN consult for drain site irritation.   LOS: 25 days    Cynthia Bowen K. 12/04/2012

## 2012-12-04 NOTE — Progress Notes (Signed)
Patients dressings changed as ordered, skin barrier cream applied to excoriated skin.

## 2012-12-04 NOTE — Progress Notes (Addendum)
HH and home tube feeds already arranged for patient ([facility] for [facility] and [facility] for TF) and will simply need notification of discharge date when pt ready.  UR complete.

## 2012-12-05 LAB — PROTIME-INR: INR: 2.24 — ABNORMAL HIGH (ref 0.00–1.49)

## 2012-12-05 MED ORDER — WARFARIN SODIUM 5 MG PO TABS
5.0000 mg | ORAL_TABLET | Freq: Once | ORAL | Status: AC
  Administered 2012-12-05: 5 mg via ORAL
  Filled 2012-12-05: qty 1

## 2012-12-05 MED ORDER — VITAL AF 1.2 CAL PO LIQD
1000.0000 mL | ORAL | Status: DC
  Administered 2012-12-05: 1000 mL
  Filled 2012-12-05 (×3): qty 1000

## 2012-12-05 NOTE — Progress Notes (Signed)
24 Days Post-Op  Subjective: Patient feels better off tube feeds + flatus and BM with Reglan INR therapeutic Barrier creams helping with skin Objective: Vital signs in last 24 hours: Temp:  [98.2 F (36.8 C)-98.8 F (37.1 C)] 98.4 F (36.9 C) (08/15 0500) Pulse Rate:  [88-96] 96 (08/15 0500) Resp:  [18-20] 20 (08/15 0500) BP: (124-152)/(55-78) 124/66 mmHg (08/15 0500) SpO2:  [94 %-100 %] 100 % (08/15 0500) Last BM Date: 12/02/12  Intake/Output from previous day: 08/14 0701 - 08/15 0700 In: 826.7 [I.V.:826.7] Out: 201 [Drains:200; Stool:1] Intake/Output this shift:    General appearance: alert, cooperative and no distress Resp: clear to auscultation bilaterally Cardio: regular rate and rhythm, S1, S2 normal, no murmur, click, rub or gallop GI: soft, + BS Wound - fistula drainage from upper midline; greenish drain output; remainder of wound is clean and granulating  Lab Results:   Recent Labs  12/03/12 0534 12/04/12 0445  WBC 13.3* 9.4  HGB 9.3* 9.3*  HCT 28.4* 28.4*  PLT 665* 618*   BMET No results found for this basename: NA, K, CL, CO2, GLUCOSE, BUN, CREATININE, CALCIUM,  in the last 72 hours PT/INR  Recent Labs  12/04/12 0445 12/05/12 0532  LABPROT 20.4* 24.1*  INR 1.80* 2.24*   ABG No results found for this basename: PHART, PCO2, PO2, HCO3,  in the last 72 hours  Studies/Results: Dg Abd Acute W/chest  12/03/2012   *RADIOLOGY REPORT*  Clinical Data: Nausea and vomiting  ACUTE ABDOMEN SERIES (ABDOMEN 2 VIEW & CHEST 1 VIEW)  Comparison: CT abdomen 11/20/2012  Findings: There is a right-sided PICC line.  Stable heart silhouette.  There is a moderate left effusion unchanged from comparison CT.  No pulmonary edema. Opacification of the left lower lobe representing atelectasis or infiltrate.  There is surgical drain in the central abdomen injecting over the sacrum.  Pigtail catheter in the upper quadrant.  There are dilated loops of small bowel measuring up to 5  cm.  There is gas in the rectum.  There is no free air beneath hemidiaphragms.  IMPRESSION:  1.  Left effusion and atelectasis.  Cannot exclude left basilar pneumonia. 2.  Dilated loops of small bowel likely represent ileus.  Surgical drains in place.   Original Report Authenticated By: Genevive Bi, M.D.    Anti-infectives: Anti-infectives   Start     Dose/Rate Route Frequency Ordered Stop   11/22/12 2200  micafungin (MYCAMINE) 100 mg in sodium chloride 0.9 % 100 mL IVPB     100 mg 100 mL/hr over 1 Hours Intravenous Every 24 hours 11/22/12 2103 12/01/12 2215   11/20/12 0730  ertapenem (INVANZ) 1 g in sodium chloride 0.9 % 50 mL IVPB     1 g 100 mL/hr over 30 Minutes Intravenous Every 24 hours 11/20/12 0709 11/29/12 0653   11/10/12 1000  piperacillin-tazobactam (ZOSYN) IVPB 3.375 g  Status:  Discontinued     3.375 g 12.5 mL/hr over 240 Minutes Intravenous 3 times per day 11/10/12 0937 11/20/12 0709      Assessment/Plan: s/p Procedure(s): EXPLORATORY LAPAROTOMY (N/A) SMALL BOWEL RESECTION (N/A) APPLICATION OF WOUND VAC (N/A) PLACEMENT OF FEEDING JEJUNOSTOMY TUBE (N/A) LYSIS OF ADHESION (N/A) Restart tube feeds at 10 ml/hr Continue Reglan D/C Lovenox as Coumadin is therapeutic  LOS: 26 days    Tyann Niehaus K. 12/05/2012

## 2012-12-05 NOTE — Progress Notes (Signed)
ANTICOAGULATION CONSULT NOTE - Follow Up Consult  Pharmacy Consult:  Coumadin Indication:  History of PE  Allergies  Allergen Reactions  . Avelox [Moxifloxacin Hcl In Nacl] Nausea And Vomiting  . Betadine [Povidone Iodine] Itching and Rash  . Alendronate Sodium Nausea And Vomiting  . Aspirin Nausea Only  . Codeine Nausea And Vomiting  . Doxycycline     Pt doesn't remember reaction  . Fluconazole     Pt doesn't remember reaction  . Hydrocodone Nausea And Vomiting    GI distress  . Neurontin [Gabapentin] Other (See Comments)    Mood changes   . Nsaids Other (See Comments)    Severe gastritis & perforation - avoid NSAIDs when possible  . Sertraline Hcl Other (See Comments)    Hallucinations   . Sulfa Antibiotics Rash    Patient Measurements: Height: 5' 4.96" (165 cm) Weight: 104 lb 15 oz (47.6 kg) IBW/kg (Calculated) : 56.91  Vital Signs: Temp: 98.4 F (36.9 C) (08/15 0500) Temp src: Oral (08/15 0500) BP: 124/66 mmHg (08/15 0500) Pulse Rate: 96 (08/15 0500)  Labs:  Recent Labs  12/03/12 0534 12/04/12 0445 12/05/12 0532  HGB 9.3* 9.3*  --   HCT 28.4* 28.4*  --   PLT 665* 618*  --   LABPROT 17.3* 20.4* 24.1*  INR 1.45 1.80* 2.24*    Estimated Creatinine Clearance: 52.7 ml/min (by C-G formula based on Cr of 0.47).     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  INR therapeutic today and Lovenox has been discontinued; no bleeding reported.   Goal of Therapy:  INR 2-3  Monitor platelets by anticoagulation protocol: Yes    Plan:  - Coumadin 5mg  PO today - Daily PT / INR     Steadman Prosperi D. Laney Potash, PharmD, BCPS Pager:  [phone] 12/05/2012, 9:49 AM

## 2012-12-06 LAB — PROTIME-INR
INR: 3.35 — ABNORMAL HIGH (ref 0.00–1.49)
Prothrombin Time: 32.7 seconds — ABNORMAL HIGH (ref 11.6–15.2)

## 2012-12-06 MED ORDER — VITAL AF 1.2 CAL PO LIQD
1000.0000 mL | ORAL | Status: DC
  Administered 2012-12-06 – 2012-12-12 (×3): 1000 mL
  Filled 2012-12-06 (×7): qty 1000

## 2012-12-06 NOTE — Progress Notes (Signed)
25 Days Post-Op  Subjective: She is feeling a little better today. No nausea or vomiting. Tolerating tube feeds at 10 cc per hour. No significant pain.  INR now 3.35. Pharmacy is dosing Coumadin. Objective: Vital signs in last 24 hours: Temp:  [98.1 F (36.7 C)-98.5 F (36.9 C)] 98.1 F (36.7 C) (08/16 0605) Pulse Rate:  [90-98] 98 (08/16 0605) Resp:  [18-19] 18 (08/16 0605) BP: (137-158)/(64-69) 137/66 mmHg (08/16 0605) SpO2:  [93 %-100 %] 93 % (08/16 0605) Last BM Date: 12/04/12  Intake/Output from previous day: 08/15 0701 - 08/16 0700 In: 5925.8 [I.V.:3973.3; NG/GT:1952.5] Out: 300 [Drains:300] Intake/Output this shift:    General appearance: alert. No distress. Cooperative. Thin and somewhat deconditioned. Resp: clear to auscultation bilaterally GI: abdomen soft. Nondistended. Midline wound packed open. Feeding tube LUQ looks good. Fistula drainage from upper abdomen greenish drain output.  Lab Results:  Results for orders placed during the hospital encounter of 11/09/12 (from the past 24 hour(s))  PROTIME-INR     Status: Abnormal   Collection Time    12/06/12  5:30 AM      Result Value Range   Prothrombin Time 32.7 (*) 11.6 - 15.2 seconds   INR 3.35 (*) 0.00 - 1.49     Studies/Results: @RISRSLT24 @  . ascorbic acid  500 mg Per Tube Daily  . feeding supplement (VITAL AF 1.2 CAL)  1,000 mL Per Tube Q24H  . fentaNYL  100 mcg Transdermal Q72H  . FLUoxetine  20 mg Per Tube Daily  . metoCLOPramide (REGLAN) injection  10 mg Intravenous Q6H  . multivitamin  5 mL Per Tube Daily  . pantoprazole (PROTONIX) IV  40 mg Intravenous QHS  . sodium chloride  10-40 mL Intracatheter Q12H  . warfarin   Does not apply Once  . Warfarin - Pharmacist Dosing Inpatient   Does not apply q1800     Assessment/Plan: s/p Procedure(s): EXPLORATORY LAPAROTOMY SMALL BOWEL RESECTION APPLICATION OF WOUND VAC PLACEMENT OF FEEDING JEJUNOSTOMY TUBE LYSIS OF ADHESION   Seems a little  better. Will increase tube feeds to 25 cc per hour. Continue Reglan Coumadin dosing per pharmacy   @PROBHOSP @  LOS: 27 days    Deklen Popelka M 12/06/2012  . .prob

## 2012-12-06 NOTE — Progress Notes (Signed)
ANTICOAGULATION CONSULT NOTE - Follow Up Consult  Pharmacy Consult:  Coumadin Indication:  History of PE  Allergies  Allergen Reactions  . Avelox [Moxifloxacin Hcl In Nacl] Nausea And Vomiting  . Betadine [Povidone Iodine] Itching and Rash  . Alendronate Sodium Nausea And Vomiting  . Aspirin Nausea Only  . Codeine Nausea And Vomiting  . Doxycycline     Pt doesn't remember reaction  . Fluconazole     Pt doesn't remember reaction  . Hydrocodone Nausea And Vomiting    GI distress  . Neurontin [Gabapentin] Other (See Comments)    Mood changes   . Nsaids Other (See Comments)    Severe gastritis & perforation - avoid NSAIDs when possible  . Sertraline Hcl Other (See Comments)    Hallucinations   . Sulfa Antibiotics Rash    Patient Measurements: Height: 5' 4.96" (165 cm) Weight: 104 lb 15 oz (47.6 kg) IBW/kg (Calculated) : 56.91  Vital Signs: Temp: 98.1 F (36.7 C) (08/16 0605) Temp src: Oral (08/16 0605) BP: 137/66 mmHg (08/16 0605) Pulse Rate: 98 (08/16 0605)  Labs:  Recent Labs  12/04/12 0445 12/05/12 0532 12/06/12 0530  HGB 9.3*  --   --   HCT 28.4*  --   --   PLT 618*  --   --   LABPROT 20.4* 24.1* 32.7*  INR 1.80* 2.24* 3.35*    Estimated Creatinine Clearance: 52.7 ml/min (by C-G formula based on Cr of 0.47).     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  INR supra-therapeutic today; no bleeding reported.   Goal of Therapy:  INR 2-3  Monitor platelets by anticoagulation protocol: Yes    Plan:  - No Coumadin today - Daily PT / INR     Uchenna Rappaport D. Laney Potash, PharmD, BCPS Pager:  [phone] 12/06/2012, 11:05 AM

## 2012-12-07 LAB — PROTIME-INR
INR: 2.95 — ABNORMAL HIGH (ref 0.00–1.49)
Prothrombin Time: 29.7 seconds — ABNORMAL HIGH (ref 11.6–15.2)

## 2012-12-07 MED ORDER — WARFARIN SODIUM 2 MG PO TABS
2.0000 mg | ORAL_TABLET | Freq: Once | ORAL | Status: AC
  Administered 2012-12-07: 2 mg via ORAL
  Filled 2012-12-07: qty 1

## 2012-12-07 NOTE — Progress Notes (Signed)
26 Days Post-Op  Subjective: Doesn't feel quite as good today. Feels a little bit more blue tooth. Passing flatus but no stool. Slightly more pain at rest and when she's and doing has no pain all.  Tube feeds at 25 cc per hour.  INR 2.95. Pharmacy dosing Coumadin.    Objective: Vital signs in last 24 hours: Temp:  [98 F (36.7 C)-98.6 F (37 C)] 98.4 F (36.9 C) (08/17 0630) Pulse Rate:  [82-87] 82 (08/17 0630) Resp:  [20-22] 20 (08/17 0630) BP: (143-169)/(69-83) 147/69 mmHg (08/17 0630) SpO2:  [96 %-99 %] 99 % (08/17 0630) Last BM Date: 12/04/12  Intake/Output from previous day: 08/16 0701 - 08/17 0700 In: 2810.5 [I.V.:2300; NG/GT:480.5] Out: 900 [Urine:600; Drains:300] Intake/Output this shift:    EXAM: General: Patient is alert. No significant distress. Flattened affect. So she doesn't know quite as good as yesterday. Abdomen:slightly distended. Soft. Wounds and drains unchanged.fistula drainage from upper midline with range drain output. Rest of the wound is granulating.  Lab Results:  Results for orders placed during the hospital encounter of 11/09/12 (from the past 24 hour(s))  PROTIME-INR     Status: Abnormal   Collection Time    12/07/12  5:15 AM      Result Value Range   Prothrombin Time 29.7 (*) 11.6 - 15.2 seconds   INR 2.95 (*) 0.00 - 1.49     Studies/Results: @RISRSLT24 @  . ascorbic acid  500 mg Per Tube Daily  . feeding supplement (VITAL AF 1.2 CAL)  1,000 mL Per Tube Q24H  . fentaNYL  100 mcg Transdermal Q72H  . FLUoxetine  20 mg Per Tube Daily  . metoCLOPramide (REGLAN) injection  10 mg Intravenous Q6H  . multivitamin  5 mL Per Tube Daily  . pantoprazole (PROTONIX) IV  40 mg Intravenous QHS  . sodium chloride  10-40 mL Intracatheter Q12H  . warfarin   Does not apply Once  . Warfarin - Pharmacist Dosing Inpatient   Does not apply q1800     Assessment/Plan: s/p Procedure(s): EXPLORATORY LAPAROTOMY SMALL BOWEL RESECTION APPLICATION OF WOUND  VAC PLACEMENT OF FEEDING JEJUNOSTOMY TUBE LYSIS OF ADHESION  Question whether she's going to tolerate any more tube feeds. We'll leave at 25 cc per hour. Continue Reglan. Coumadin dosing per pharmacy.  @PROBHOSP @  LOS: 28 days    Cynthia Bowen M 12/07/2012  . .prob

## 2012-12-07 NOTE — Progress Notes (Signed)
Patient complained of more nausea and is more distended than early this morning. MD notified, order received.

## 2012-12-07 NOTE — Progress Notes (Signed)
ANTICOAGULATION CONSULT NOTE - Follow Up Consult  Pharmacy Consult:  Coumadin Indication:  History of PE  Allergies  Allergen Reactions  . Avelox [Moxifloxacin Hcl In Nacl] Nausea And Vomiting  . Betadine [Povidone Iodine] Itching and Rash  . Alendronate Sodium Nausea And Vomiting  . Aspirin Nausea Only  . Codeine Nausea And Vomiting  . Doxycycline     Pt doesn't remember reaction  . Fluconazole     Pt doesn't remember reaction  . Hydrocodone Nausea And Vomiting    GI distress  . Neurontin [Gabapentin] Other (See Comments)    Mood changes   . Nsaids Other (See Comments)    Severe gastritis & perforation - avoid NSAIDs when possible  . Sertraline Hcl Other (See Comments)    Hallucinations   . Sulfa Antibiotics Rash    Patient Measurements: Height: 5' 4.96" (165 cm) Weight: 104 lb 15 oz (47.6 kg) IBW/kg (Calculated) : 56.91  Vital Signs: Temp: 98.4 F (36.9 C) (08/17 0630) Temp src: Oral (08/17 0630) BP: 147/69 mmHg (08/17 0630) Pulse Rate: 82 (08/17 0630)  Labs:  Recent Labs  12/05/12 0532 12/06/12 0530 12/07/12 0515  LABPROT 24.1* 32.7* 29.7*  INR 2.24* 3.35* 2.95*    Estimated Creatinine Clearance: 52.7 ml/min (by C-G formula based on Cr of 0.47).     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  INR decreased to therapeutic level today; no bleeding reported.   Goal of Therapy:  INR 2-3  Monitor platelets by anticoagulation protocol: Yes    Plan:  - Coumadin 2mg  PO today - Daily PT / INR    Yordin Rhoda D. Laney Potash, PharmD, BCPS Pager:  [phone] 12/07/2012, 1:26 PM

## 2012-12-08 ENCOUNTER — Inpatient Hospital Stay (HOSPITAL_COMMUNITY): Payer: Medicare Other

## 2012-12-08 LAB — PROTIME-INR: Prothrombin Time: 25.9 seconds — ABNORMAL HIGH (ref 11.6–15.2)

## 2012-12-08 MED ORDER — WARFARIN SODIUM 2.5 MG PO TABS
2.5000 mg | ORAL_TABLET | Freq: Once | ORAL | Status: AC
  Administered 2012-12-08: 2.5 mg via ORAL
  Filled 2012-12-08: qty 1

## 2012-12-08 MED ORDER — IOHEXOL 300 MG/ML  SOLN
100.0000 mL | Freq: Once | INTRAMUSCULAR | Status: AC | PRN
  Administered 2012-12-08: 80 mL via INTRAVENOUS

## 2012-12-08 MED ORDER — IOHEXOL 300 MG/ML  SOLN
25.0000 mL | INTRAMUSCULAR | Status: AC
  Administered 2012-12-08 (×2): 25 mL via ORAL

## 2012-12-08 NOTE — Progress Notes (Signed)
27 Days Post-Op  Subjective: Stomach feels better, tube feed off since yesterday.  She thinks she is still somewhat distended.  Objective: Vital signs in last 24 hours: Temp:  [97.9 F (36.6 C)-98.5 F (36.9 C)] 97.9 F (36.6 C) (08/18 0558) Pulse Rate:  [80-81] 81 (08/18 0558) Resp:  [18-20] 18 (08/18 0558) BP: (141-156)/(64-67) 141/67 mmHg (08/18 0558) SpO2:  [98 %-99 %] 98 % (08/18 0558) Last BM Date: 12/07/12 Drain:  , 250 Tube feed recorded. Afebrile, VSS, BP up some INR 2.47 Last xray was 12/03/12 Intake/Output from previous day: 08/17 0701 - 08/18 0700 In: 2650 [I.V.:2400; NG/GT:250] Out: 950 [Urine:650; Drains:300] Intake/Output this shift: Total I/O In: -  Out: 50 [Drains:50]  General appearance: alert, cooperative and no distress Resp: clear to auscultation bilaterally and anterior GI: she has more drainage coming around the drain than when I saw her over a week ago.  Drainges from upper abdomen open incision is still therel.  Less than last time.  she had some necrotic tisse at the base of the upper abdominal site, I cleaned out with a sterile applicator stick.  The remainder of the wound looks pretty good some white fibrous tissue mid-abdomen/base, but for the most part it all looks good.  Lab Results:  No results found for this basename: WBC, HGB, HCT, PLT,  in the last 72 hours  BMET No results found for this basename: NA, K, CL, CO2, GLUCOSE, BUN, CREATININE, CALCIUM,  in the last 72 hours PT/INR  Recent Labs  12/07/12 0515 12/08/12 0500  LABPROT 29.7* 25.9*  INR 2.95* 2.47*    No results found for this basename: AST, ALT, ALKPHOS, BILITOT, PROT, ALBUMIN,  in the last 168 hours   Lipase     Component Value Date/Time   LIPASE 23 11/09/2012 1520     Studies/Results: No results found.  Medications: . ascorbic acid  500 mg Per Tube Daily  . feeding supplement (VITAL AF 1.2 CAL)  1,000 mL Per Tube Q24H  . fentaNYL  100 mcg Transdermal Q72H   . FLUoxetine  20 mg Per Tube Daily  . metoCLOPramide (REGLAN) injection  10 mg Intravenous Q6H  . multivitamin  5 mL Per Tube Daily  . pantoprazole (PROTONIX) IV  40 mg Intravenous QHS  . sodium chloride  10-40 mL Intracatheter Q12H  . warfarin  2.5 mg Oral ONCE-1800  . warfarin   Does not apply Once  . Warfarin - Pharmacist Dosing Inpatient   Does not apply q1800   . 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 Tsuei, MD.  Fistula drain RUQ upsized 11/19/12.  COPD (chronic obstructive pulmonary disease)  Pneumonia  12-2011  GERD (gastroesophageal reflux disease)  Headache(784.0)  Arthritis  osteoarthritis  Allergy  Depression  Neuromuscular disorder  Fibromyalgia  PCM  Plan:  Primary issues are bloating and TF is off, hydration by the IV, she has significantly more drainage around the drain site than I remember from my last exam, (over 1 week ago,) Last film 12/03/12.  I will discuss with Dr.Tsuei.  LOS: 29 days    Asah Lamay 12/08/2012

## 2012-12-08 NOTE — Progress Notes (Addendum)
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   S/P exploratory laparotomy with lysis of adhesions, small bowel resection, and application of wound VAC 7/22. CT aspiration of abdominal fluid collection 7/31.   Vital AF 1.2 formula currently on hold for CT scan to evaluate for ileus/RUQ.  Patient c/o abdominal distention. Spoke with RN who reported formula was at 25 ml/hr prior to being turned off.    Patient may benefit from more concentrated formula at lower goal rate.  Height: Ht Readings from Last 1 Encounters:  11/10/12 5' 4.96" (1.65 m)    Weight Status:   Wt Readings from Last 1 Encounters:  11/16/12 104 lb 15 oz (47.6 kg)    Re-estimated needs:  Kcal: 1500-1700 Protein: 80-95 gm Fluid: 1.5-1.7 L  Skin: bilateral abdominal incision   Diet Order: NPO   Intake/Output Summary (Last 24 hours) at 12/08/12 1252 Last data filed at 12/08/12 1308  Gross per 24 hour  Intake   2650 ml  Output    700 ml  Net   1950 ml    Scheduled Meds: . ascorbic acid  500 mg Per Tube Daily  . feeding supplement (VITAL AF 1.2 CAL)  1,000 mL Per Tube Q24H  . fentaNYL  100 mcg Transdermal Q72H  . FLUoxetine  20 mg Per Tube Daily  . metoCLOPramide (REGLAN) injection  10 mg Intravenous Q6H  .  multivitamin  5 mL Per Tube Daily  . pantoprazole (PROTONIX) IV  40 mg Intravenous QHS  . sodium chloride  10-40 mL Intracatheter Q12H  . warfarin  2.5 mg Oral ONCE-1800  . warfarin   Does not apply Once  . Warfarin - Pharmacist Dosing Inpatient   Does not apply q1800    Continuous Infusions: . sodium chloride 100 mL/hr (12/08/12 0532)    Maureen Chatters, RD, LDN Pager #: [phone] After-Hours Pager #: [phone]

## 2012-12-08 NOTE — Progress Notes (Signed)
27 Days Post-Op  Subjective: Patient still having some abdominal distention whenever tube feeds are restarted.  TF held last night Feels better now. Large drain output  Objective: Vital signs in last 24 hours: Temp:  [97.9 F (36.6 C)-98.5 F (36.9 C)] 97.9 F (36.6 C) (08/18 0558) Pulse Rate:  [80-81] 81 (08/18 0558) Resp:  [18-20] 18 (08/18 0558) BP: (141-156)/(64-67) 141/67 mmHg (08/18 0558) SpO2:  [98 %-99 %] 98 % (08/18 0558) Last BM Date: 12/07/12  Intake/Output from previous day: 08/17 0701 - 08/18 0700 In: 2650 [I.V.:2400; NG/GT:250] Out: 950 [Urine:650; Drains:300] Intake/Output this shift: Total I/O In: 0  Out: 50 [Drains:50]  Abd - less distended; + BS Wound - clean, well-granulated, minimal fistula output Drain - thin green clear output  Lab Results:  No results found for this basename: WBC, HGB, HCT, PLT,  in the last 72 hours BMET No results found for this basename: NA, K, CL, CO2, GLUCOSE, BUN, CREATININE, CALCIUM,  in the last 72 hours PT/INR  Recent Labs  12/07/12 0515 12/08/12 0500  LABPROT 29.7* 25.9*  INR 2.95* 2.47*   ABG No results found for this basename: PHART, PCO2, PO2, HCO3,  in the last 72 hours  Studies/Results: No results found.  Anti-infectives: Anti-infectives   Start     Dose/Rate Route Frequency Ordered Stop   11/22/12 2200  micafungin (MYCAMINE) 100 mg in sodium chloride 0.9 % 100 mL IVPB     100 mg 100 mL/hr over 1 Hours Intravenous Every 24 hours 11/22/12 2103 12/01/12 2215   11/20/12 0730  ertapenem (INVANZ) 1 g in sodium chloride 0.9 % 50 mL IVPB     1 g 100 mL/hr over 30 Minutes Intravenous Every 24 hours 11/20/12 0709 11/29/12 0653   11/10/12 1000  piperacillin-tazobactam (ZOSYN) IVPB 3.375 g  Status:  Discontinued     3.375 g 12.5 mL/hr over 240 Minutes Intravenous 3 times per day 11/10/12 0937 11/20/12 0709      Assessment/Plan: s/p Procedure(s): EXPLORATORY LAPAROTOMY (N/A) SMALL BOWEL RESECTION  (N/A) APPLICATION OF WOUND VAC (N/A) PLACEMENT OF FEEDING JEJUNOSTOMY TUBE (N/A) LYSIS OF ADHESION (N/A) Ileus - unclear etiology; not obstructed since she is still having BM CT scan to evaluate ileus/ RUQ abscess  Hold tube feeds for now. Repeat labs   LOS: 29 days    Cynthia Bowen K. 12/08/2012

## 2012-12-08 NOTE — Progress Notes (Signed)
ANTICOAGULATION CONSULT NOTE - Follow Up Consult  Pharmacy Consult:  Coumadin Indication:  History of PE  Allergies  Allergen Reactions  . Avelox [Moxifloxacin Hcl In Nacl] Nausea And Vomiting  . Betadine [Povidone Iodine] Itching and Rash  . Alendronate Sodium Nausea And Vomiting  . Aspirin Nausea Only  . Codeine Nausea And Vomiting  . Doxycycline     Pt doesn't remember reaction  . Fluconazole     Pt doesn't remember reaction  . Hydrocodone Nausea And Vomiting    GI distress  . Neurontin [Gabapentin] Other (See Comments)    Mood changes   . Nsaids Other (See Comments)    Severe gastritis & perforation - avoid NSAIDs when possible  . Sertraline Hcl Other (See Comments)    Hallucinations   . Sulfa Antibiotics Rash    Patient Measurements: Height: 5' 4.96" (165 cm) Weight: 104 lb 15 oz (47.6 kg) IBW/kg (Calculated) : 56.91  Vital Signs: Temp: 97.9 F (36.6 C) (08/18 0558) Temp src: Oral (08/18 0558) BP: 141/67 mmHg (08/18 0558) Pulse Rate: 81 (08/18 0558)  Labs:  Recent Labs  12/06/12 0530 12/07/12 0515 12/08/12 0500  LABPROT 32.7* 29.7* 25.9*  INR 3.35* 2.95* 2.47*    Estimated Creatinine Clearance: 52.7 ml/min (by C-G formula based on Cr of 0.47).  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  INR remains therapeutic at 2.47 but down slightly. No new CBC today, no bleeding noted.   Goal of Therapy:  INR 2-3   Plan:  1. Warfarin 2.5mg  PO x 1 tonight 2. F/u AM INR  Lysle Pearl, PharmD, BCPS Pager # [phone] 12/08/2012 9:00 AM

## 2012-12-08 NOTE — Progress Notes (Signed)
PT Cancellation Note  Patient Details Name: Cynthia Bowen MRN: [id number] DOB: [birth year]   Cancelled Treatment:    Reason Eval/Treat Not Completed: Patient at procedure or test/unavailable.  Will attempt back at later date.       Verdell Face, [place] [phone] 12/08/2012

## 2012-12-08 NOTE — Progress Notes (Signed)
See my separate note.  [name]. [name], MD, [facility] Surgery  General/ Trauma Surgery  12/08/2012 9:57 AM

## 2012-12-09 ENCOUNTER — Inpatient Hospital Stay (HOSPITAL_COMMUNITY): Payer: Medicare Other

## 2012-12-09 LAB — PROTIME-INR: Prothrombin Time: 32.7 seconds — ABNORMAL HIGH (ref 11.6–15.2)

## 2012-12-09 LAB — BASIC METABOLIC PANEL
BUN: <3 mg/dL — ABNORMAL LOW (ref 6–23)
Creatinine, Ser: 0.51 mg/dL (ref 0.50–1.10)
GFR calc Af Amer: >90 mL/min (ref >90)
GFR calc non Af Amer: >90 mL/min (ref >90)
Glucose, Bld: 46 mg/dL — ABNORMAL LOW (ref 70–99)

## 2012-12-09 LAB — CBC
MCH: 25.7 pg — ABNORMAL LOW (ref 26.0–34.0)
MCHC: 33.1 g/dL (ref 30.0–36.0)
MCV: 77.5 fL — ABNORMAL LOW (ref 78.0–100.0)
Platelets: 536 10*3/uL — ABNORMAL HIGH (ref 150–400)

## 2012-12-09 LAB — MAGNESIUM: Magnesium: 1.3 mg/dL — ABNORMAL LOW (ref 1.5–2.5)

## 2012-12-09 LAB — PHOSPHORUS: Phosphorus: 3.5 mg/dL (ref 2.3–4.6)

## 2012-12-09 MED ORDER — POTASSIUM CHLORIDE IN NACL 40-0.9 MEQ/L-% IV SOLN
INTRAVENOUS | Status: AC
  Administered 2012-12-09: 11:00:00 via INTRAVENOUS
  Filled 2012-12-09 (×2): qty 1000

## 2012-12-09 MED ORDER — MAGNESIUM SULFATE 40 MG/ML IJ SOLN
4.0000 g | Freq: Once | INTRAMUSCULAR | Status: AC
  Administered 2012-12-09: 4 g via INTRAVENOUS
  Filled 2012-12-09: qty 100

## 2012-12-09 MED ORDER — IOHEXOL 300 MG/ML  SOLN
50.0000 mL | Freq: Once | INTRAMUSCULAR | Status: AC | PRN
  Administered 2012-12-09: 30 mL

## 2012-12-09 MED ORDER — TRACE MINERALS CR-CU-F-FE-I-MN-MO-SE-ZN IV SOLN
INTRAVENOUS | Status: AC
  Administered 2012-12-09: 19:00:00 via INTRAVENOUS
  Filled 2012-12-09: qty 1000

## 2012-12-09 MED ORDER — POTASSIUM CHLORIDE 10 MEQ/50ML IV SOLN
10.0000 meq | INTRAVENOUS | Status: AC
  Administered 2012-12-09 (×5): 10 meq via INTRAVENOUS
  Filled 2012-12-09 (×5): qty 50

## 2012-12-09 MED ORDER — SODIUM CHLORIDE 0.9 % IV SOLN
1.0000 g | INTRAVENOUS | Status: DC
  Administered 2012-12-09 – 2012-12-15 (×7): 1 g via INTRAVENOUS
  Filled 2012-12-09 (×9): qty 1

## 2012-12-09 MED ORDER — FAT EMULSION 20 % IV EMUL
240.0000 mL | INTRAVENOUS | Status: AC
  Administered 2012-12-09: 240 mL via INTRAVENOUS
  Filled 2012-12-09: qty 250

## 2012-12-09 MED ORDER — POTASSIUM CHLORIDE IN NACL 40-0.9 MEQ/L-% IV SOLN
INTRAVENOUS | Status: DC
  Administered 2012-12-09 – 2012-12-16 (×7): via INTRAVENOUS
  Filled 2012-12-09 (×13): qty 1000

## 2012-12-09 NOTE — Progress Notes (Signed)
CRITICAL VALUE ALERT  Critical value received:  Potassium 2.2  Date of notification:  8/19  Time of notification:  0650  Critical value read back:yes  Nurse who received alert:  Lorenda Peck RN  MD notified (1st page):  [place]  Time of first page:  [phone]  MD notified (2nd page):  Time of second page:  Responding MD:    Time MD responded:

## 2012-12-09 NOTE — Progress Notes (Signed)
28 Days Post-Op  Subjective: Feeding tube dislodged this morning - I was able to reinsert a new 14 Fr red rubber catheter without difficult.  Contrast x-ray confirmation pending Overall, the patient feels good. CT scan showed some focal thickening of the SB K is low, which is likely contributing to her ileus  Objective: Vital signs in last 24 hours: Temp:  [97.5 F (36.4 C)-98.1 F (36.7 C)] 97.5 F (36.4 C) (08/19 0557) Pulse Rate:  [80-92] 80 (08/19 0557) Resp:  [18] 18 (08/19 0557) BP: (131-142)/(54-62) 135/54 mmHg (08/19 0557) SpO2:  [97 %-98 %] 97 % (08/19 0557) Last BM Date: 12/07/12  Intake/Output from previous day: 08/18 0701 - 08/19 0700 In: 2875 [P.O.:240; I.V.:2635] Out: 545 [Drains:545] Intake/Output this shift: Total I/O In: 100 [I.V.:100] Out: 20 [Drains:20]  General appearance: alert, cooperative and no distress Resp: clear to auscultation bilaterally Cardio: regular rate and rhythm, S1, S2 normal, no murmur, click, rub or gallop GI: soft, skin erythema around drain is improving;  Midline incision clean; much less fistula output; well-granulated Jejunostomy replaced without difficulty - contrast study pending  Lab Results:   Recent Labs  12/09/12 0500  WBC 12.9*  HGB 9.8*  HCT 29.6*  PLT 536*   BMET  Recent Labs  12/09/12 0500  NA 142  K 2.2*  CL 101  CO2 23  GLUCOSE 46*  BUN <3*  CREATININE 0.51  CALCIUM 8.2*   PT/INR  Recent Labs  12/08/12 0500 12/09/12 0500  LABPROT 25.9* 32.7*  INR 2.47* 3.35*   ABG No results found for this basename: PHART, PCO2, PO2, HCO3,  in the last 72 hours  Studies/Results: Ct Abdomen Pelvis W Contrast  12/08/2012   *RADIOLOGY REPORT*  Clinical Data: Abdominal distention.  Persistent drainage.  New ileus.  Status post exploratory laparotomy with small bowel resection and placement of jejunostomy tube.  Lysis of adhesions.  CT ABDOMEN AND PELVIS WITH CONTRAST  Technique:  Multidetector CT imaging of the  abdomen and pelvis was performed following the standard protocol during bolus administration of intravenous contrast.  Contrast: 80mL OMNIPAQUE IOHEXOL 300 MG/ML  SOLN  Comparison: Acute abdomen series 12/03/2012.  Most recent CT 11/19/2012.  Findings: Lung bases:  Mild left greater right bibasilar airspace disease.  Slightly improved left base aeration, likely due to atelectasis.  Cardiomegaly, with a similar small pericardial effusion.  Small left-sided pleural effusion is similar to minimally decreased.  Abdomen/pelvis:  Normal liver, spleen.  The stomach appears to communicate with the cavity within the right upper quadrant, the site of prior percutaneous drain placement.  Example image 35/series 2.  There is no evidence of residual fluid collection in this area.  This area may communicate with the midline abdominal wound with gas seen within.  Example images 37 - 38/series 2.  Fluid collection anterior to the stomach is nearly completely resolved, with trace fluid and enhancement identified at 1.9 x 1.4 cm on image 30/series 2.  A left upper quadrant subdiaphragmatic collection is decreased and measures 2.3 x 1.1 cm today versus 5.6 x 3.9 cm on the prior.  Normal pancreas, gallbladder, biliary tract, adrenal glands.  Right renal calculi.  Renal cysts and too small to characterize lesions.  Dense atherosclerosis at the origin of the right renal artery and throughout the aorta. No retroperitoneal or retrocrural adenopathy.  The colon is normal in caliber.  Moderate length segment of the mid small bowel wall thickening, including on images 45 - 50/series 2.  Mild small bowel  dilatation both proximal and distal to this site, including 3.9 cm proximally on image 37/series 2 and 4.1 cm distally on image 53/series 2. Enterotomy site identified in the right side of the abdomen on image 46 of series 2.  Decrease size of  ileal mesenteric fluid collection.  Posteriorly, this measures 1.9 cm on image 57 versus 3.7 cm on  the prior.  Inferiorly and laterally, this measures 2.1 cm on image 60 versus 2.9 cm on the prior.  No new fluid collections are identified. No pneumatosis or free intraperitoneal air.  Percutaneous jejunostomy tube is identified.  Distal small bowel loops are relatively decompressed, but no focal transition point is identified.  No pelvic adenopathy.  Hysterectomy.  Normal urinary bladder. Probable right ovarian residual follicle at 2.2 cm.  Trace cul-de- sac fluid.  Bones/Musculoskeletal:  No acute osseous abnormality.  Advanced degenerative disc disease at L4-L5.  IMPRESSION:  1.  Moderate segment mid small bowel wall thickening, most consistent with enteritis.  Favor infection.  Mild proximal mid small bowel dilatation, without focal transition point.  Favor adynamic ileus. 2.  Overall improved appearance of multiple abdominal pelvic fluid collections/abscesses. 3.  Gastric antrum appears to communicate with the right upper quadrant cavity (site of percutaneous drain) and likely the anterior abdominal wall. 4.  Small left pleural effusion with left greater than right bibasilar atelectasis. 5.  Right nephrolithiasis.   Original Report Authenticated By: Jeronimo Greaves, M.D.    Anti-infectives: Anti-infectives   Start     Dose/Rate Route Frequency Ordered Stop   12/09/12 0900  ertapenem (INVANZ) 1 g in sodium chloride 0.9 % 50 mL IVPB     1 g 100 mL/hr over 30 Minutes Intravenous Every 24 hours 12/09/12 0852     11/22/12 2200  micafungin (MYCAMINE) 100 mg in sodium chloride 0.9 % 100 mL IVPB     100 mg 100 mL/hr over 1 Hours Intravenous Every 24 hours 11/22/12 2103 12/01/12 2215   11/20/12 0730  ertapenem (INVANZ) 1 g in sodium chloride 0.9 % 50 mL IVPB     1 g 100 mL/hr over 30 Minutes Intravenous Every 24 hours 11/20/12 0709 11/29/12 0653   11/10/12 1000  piperacillin-tazobactam (ZOSYN) IVPB 3.375 g  Status:  Discontinued     3.375 g 12.5 mL/hr over 240 Minutes Intravenous 3 times per day 11/10/12  0937 11/20/12 0709      Assessment/Plan: s/p Procedure(s): EXPLORATORY LAPAROTOMY (N/A) SMALL BOWEL RESECTION (N/A) APPLICATION OF WOUND VAC (N/A) PLACEMENT OF FEEDING JEJUNOSTOMY TUBE (N/A) LYSIS OF ADHESION (N/A) Replete K aggressively Replace jejunostomy tube Restart antibiotics - enteritis;  Restart TNA   LOS: 30 days    Cynthia Nielsen K. 12/09/2012

## 2012-12-09 NOTE — Progress Notes (Signed)
CRITICAL VALUE ALERT  Critical value received:  Potassium 2.2  Date of notification:  12/09/2012  Time of notification:  0655  Critical value read back:yes  Nurse who received alert:  Lorenda Peck  MD notified (1st page):  Dr. Andrey Campanile  Time of first page:  [phone]  MD notified (2nd page):  Time of second page:  Responding MD:  Dr. Harlon Flor   Time MD responded:  [phone]

## 2012-12-09 NOTE — Progress Notes (Signed)
PT Cancellation Note  Patient Details Name: Cynthia Bowen MRN: [id number] DOB: 03-12-1946   Cancelled Treatment:    Reason Eval/Treat Not Completed: Pain limiting ability to participate. RN aware and provided pain meds however pt still declining x2.    Ludger Nutting 12/09/2012, 3:20 PM Pager: [phone]

## 2012-12-09 NOTE — Progress Notes (Addendum)
PARENTERAL NUTRITION CONSULT NOTE - INITIAL and ANTICOAGULATION CONSULT NOTE - Follow-Up  Pharmacy Consult: TPN and Coumadin Indication: Ileus and h/o PE  Allergies  Allergen Reactions  . Avelox [Moxifloxacin Hcl In Nacl] Nausea And Vomiting  . Betadine [Povidone Iodine] Itching and Rash  . Alendronate Sodium Nausea And Vomiting  . Aspirin Nausea Only  . Codeine Nausea And Vomiting  . Doxycycline     Pt doesn't remember reaction  . Fluconazole     Pt doesn't remember reaction  . Hydrocodone Nausea And Vomiting    GI distress  . Neurontin [Gabapentin] Other (See Comments)    Mood changes   . Nsaids Other (See Comments)    Severe gastritis & perforation - avoid NSAIDs when possible  . Sertraline Hcl Other (See Comments)    Hallucinations   . Sulfa Antibiotics Rash    Patient Measurements: Height: 5' 4.96" (165 cm) Weight: 104 lb 15 oz (47.6 kg) IBW/kg (Calculated) : 56.91  Vital Signs: Temp: 97.5 F (36.4 C) (08/19 0557) Temp src: Oral (08/19 0557) BP: 135/54 mmHg (08/19 0557) Pulse Rate: 80 (08/19 0557) Intake/Output from previous day: 08/18 0701 - 08/19 0700 In: 2875 [P.O.:240; I.V.:2635] Out: 545 [Drains:545] Intake/Output from this shift: Total I/O In: 100 [I.V.:100] Out: 20 [Drains:20]  Labs:  Recent Labs  12/07/12 0515 12/08/12 0500 12/09/12 0500  WBC  --   --  12.9*  HGB  --   --  9.8*  HCT  --   --  29.6*  PLT  --   --  536*  INR 2.95* 2.47* 3.35*     Recent Labs  12/09/12 0500  NA 142  K 2.2*  CL 101  CO2 23  GLUCOSE 46*  BUN <3*  CREATININE 0.51  CALCIUM 8.2*   Estimated Creatinine Clearance: 52.7 ml/min (by C-G formula based on Cr of 0.51).   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 s/p exp lap 7/22 with LOA, SBR, wound vac and J-tube placement.  Pt on TPN earlier this admit from 7/22-7/27 post-op while waiting for J-tube to be ready to be used. Pt has been on J-tube feedings since 7/27. Pt now with ileus so TF on hold and to restart TPN. Also noted that feeding tube was dislodged this a.m. and replaced by surgery.  GI: hx GERD. TPN for ileus. TF have been on hold since 8/17 as pt with abd distention with tube feeds. On PPI IV.  Endo: no hx - CBGs controlled this admit (both on and off TPN)  Lytes: K+ 2.2 - MD replaced with 5 runs and added to NS that is running at 140ml/hr. MG 1.3 - will replace with 4 gm IVPB. (Goal with ileus K>4 and Mg>2). Phos ok.   Renal: SCr stable, CrCL ~50 ml/min. MIVF NS with KCl at 100 ml/hr.   Pulm: hx COPD - 1L [place]  Cards: no hx - VSS  Hepatobil: 8/4 LFTs WNL, albumin 1.7.  Neuro: Hx fibromyalgia, depression - prozac, fent patch  AC: Coumadin for h/o PE, no bleeding. INR up to 3.35 (supratherapeutic today) - may be due to pt with no TF past 24 hours. H/H remain stable. Plt high. No bleeding noted.  ID: Ertapenem restarted 8/19 for enteritis. Wbc slightly elevated. Afeb.  Zosyn 7/21 >> 7/31  Ertapenem 7/31>>8/9; 8/19>> Micafungin 8/2 >> 8/11  7/31 peritoneal fluid>> few candida albicans  Best Practices: coumadin  TPN Access: PICC line (triple lumen)   TPN day#: 0 (restart 8/19; off since 7/27)  Current Nutrition:  NPO; TF off  Nutritional Goals:  Kcal: 1500-1700  Protein: 80-95 gm  Fluid: 1.5-1.7 L  Plan:  -  Start Clinimix E 5/15 at 39ml/hr + lipids at 61ml/hr. Will start low due to electrolyte abnormalities even though pt should not be at risk of refeeding since only off tube feeds x 2 days. (Goal rate - Clinimix E 5/15 at 24ml/hr + lipids at 2ml/hr to provide 1672 kCal/d and 84gm of protein per day).  - Magnesium 4gm IVPB - Daily multivitamin, trace elements, and vit C will be added to TPN - Will decrease MIVF to 60 ml/hr when TPN hangs. - F/u TPN labs in a.m. - Will  hold coumadin today. F/u daily INR  Christoper Fabian, PharmD, BCPS Clinical pharmacist, pager [phone] 12/09/2012, 9:08 AM

## 2012-12-09 NOTE — Progress Notes (Signed)
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   S/P exploratory laparotomy with lysis of adhesions, small bowel resection, and application of wound VAC 7/22. CT aspiration of abdominal fluid collection 7/31.   CT Abdomen/pelvis ---> small bowel wall thickening, most consistent with enteritis; favoring infection and ileus.  RD consulted per Pharmacy for new TPN.  Patient is receiving TPN with Clinimix E 5/15 @ 40 ml/hr and lipids @ 10 ml/hr. Provides 1162 kcals and 48 grams protein per day.  Meets 77% minimum estimated energy needs and 60% minimum estimated protein needs.  Height: Ht Readings from Last 1 Encounters:  11/10/12 5' 4.96" (1.65 m)    Weight Status:   Wt Readings from Last 1 Encounters:  12/09/12 80 lb 11 oz (36.6 kg)    Body mass index is 13.44 kg/(m^2).  Re-estimated needs:  Kcal: 1500-1700 Protein: 80-95 gm Fluid: 1.5-1.7 L  Skin: bilateral abdominal incision   Diet Order: NPO   Intake/Output Summary (Last 24 hours) at 12/09/12 1134 Last data filed at 12/09/12 0800  Gross per 24 hour  Intake   2475 ml  Output    515 ml  Net   1960 ml    Labs:   Recent Labs Lab 12/09/12 0500  NA 142  K 2.2*  CL 101  CO2 23  BUN <3*  CREATININE 0.51  CALCIUM 8.2*  MG 1.3*  PHOS 3.5  GLUCOSE 46*    Scheduled Meds: .  ertapenem  1 g Intravenous Q24H  . feeding supplement (VITAL AF 1.2 CAL)  1,000 mL Per Tube Q24H  . fentaNYL  100 mcg Transdermal Q72H  . FLUoxetine  20 mg Per Tube Daily  . magnesium sulfate 1 - 4 g bolus IVPB  4 g Intravenous Once  . metoCLOPramide (REGLAN) injection  10 mg Intravenous Q6H  . pantoprazole (PROTONIX) IV  40 mg Intravenous QHS  . potassium chloride  10 mEq Intravenous Q1 Hr x 5  . sodium chloride  10-40 mL Intracatheter Q12H  . warfarin   Does not apply Once  . Warfarin - Pharmacist Dosing Inpatient   Does not apply q1800    Continuous Infusions: . 0.9 % NaCl with KCl 40 mEq / L 100 mL/hr at 12/09/12 1038  . 0.9 % NaCl with KCl 40 mEq / L    . Marland KitchenTPN (CLINIMIX-E) Adult     And  . fat emulsion      Maureen Chatters, RD, LDN Pager #: [phone] After-Hours Pager #: [phone]

## 2012-12-10 ENCOUNTER — Encounter: Payer: Medicare Other | Admitting: Internal Medicine

## 2012-12-10 LAB — COMPREHENSIVE METABOLIC PANEL
ALT: 9 U/L (ref 0–35)
Albumin: 1.9 g/dL — ABNORMAL LOW (ref 3.5–5.2)
Alkaline Phosphatase: 68 U/L (ref 39–117)
BUN: 3 mg/dL — ABNORMAL LOW (ref 6–23)
Calcium: 8.3 mg/dL — ABNORMAL LOW (ref 8.4–10.5)
GFR calc Af Amer: >90 mL/min (ref >90)
Glucose, Bld: 144 mg/dL — ABNORMAL HIGH (ref 70–99)
Potassium: 3.1 mEq/L — ABNORMAL LOW (ref 3.5–5.1)
Sodium: 138 mEq/L (ref 135–145)
Total Protein: 6.7 g/dL (ref 6.0–8.3)

## 2012-12-10 LAB — CBC
MCV: 76.6 fL — ABNORMAL LOW (ref 78.0–100.0)
Platelets: 483 10*3/uL — ABNORMAL HIGH (ref 150–400)
RBC: 4.1 MIL/uL (ref 3.87–5.11)
RDW: 20.5 % — ABNORMAL HIGH (ref 11.5–15.5)
WBC: 6.8 10*3/uL (ref 4.0–10.5)

## 2012-12-10 LAB — PHOSPHORUS: Phosphorus: 2 mg/dL — ABNORMAL LOW (ref 2.3–4.6)

## 2012-12-10 LAB — PREALBUMIN: Prealbumin: 4.9 mg/dL — ABNORMAL LOW (ref 17.0–34.0)

## 2012-12-10 LAB — DIFFERENTIAL
Basophils Absolute: 0 10*3/uL (ref 0.0–0.1)
Lymphocytes Relative: 12 % (ref 12–46)
Lymphs Abs: 0.8 10*3/uL (ref 0.7–4.0)
Neutro Abs: 5.4 10*3/uL (ref 1.7–7.7)

## 2012-12-10 LAB — TRIGLYCERIDES: Triglycerides: 98 mg/dL (ref <150)

## 2012-12-10 LAB — PROTIME-INR: Prothrombin Time: 21.1 seconds — ABNORMAL HIGH (ref 11.6–15.2)

## 2012-12-10 MED ORDER — ALTEPLASE 2 MG IJ SOLR
2.0000 mg | Freq: Once | INTRAMUSCULAR | Status: AC
  Administered 2012-12-10: 2 mg
  Filled 2012-12-10: qty 2

## 2012-12-10 MED ORDER — POTASSIUM CHLORIDE 10 MEQ/50ML IV SOLN
10.0000 meq | INTRAVENOUS | Status: AC
  Administered 2012-12-10 (×4): 10 meq via INTRAVENOUS
  Filled 2012-12-10 (×4): qty 50

## 2012-12-10 MED ORDER — POTASSIUM PHOSPHATE DIBASIC 3 MMOLE/ML IV SOLN
20.0000 mmol | Freq: Once | INTRAVENOUS | Status: AC
  Administered 2012-12-10: 20 mmol via INTRAVENOUS
  Filled 2012-12-10: qty 6.67

## 2012-12-10 MED ORDER — TRACE MINERALS CR-CU-F-FE-I-MN-MO-SE-ZN IV SOLN
INTRAVENOUS | Status: AC
  Administered 2012-12-10: 19:00:00 via INTRAVENOUS
  Filled 2012-12-10: qty 1000

## 2012-12-10 MED ORDER — POTASSIUM CHLORIDE 10 MEQ/50ML IV SOLN
10.0000 meq | INTRAVENOUS | Status: DC
  Filled 2012-12-10 (×4): qty 50

## 2012-12-10 MED ORDER — MAGNESIUM SULFATE 40 MG/ML IJ SOLN
4.0000 g | Freq: Once | INTRAMUSCULAR | Status: AC
  Administered 2012-12-10: 4 g via INTRAVENOUS
  Filled 2012-12-10: qty 100

## 2012-12-10 MED ORDER — WARFARIN SODIUM 5 MG PO TABS
5.0000 mg | ORAL_TABLET | Freq: Once | ORAL | Status: AC
  Administered 2012-12-10: 5 mg via ORAL
  Filled 2012-12-10: qty 1

## 2012-12-10 MED ORDER — FAT EMULSION 20 % IV EMUL
240.0000 mL | INTRAVENOUS | Status: AC
  Administered 2012-12-10: 240 mL via INTRAVENOUS
  Filled 2012-12-10: qty 250

## 2012-12-10 NOTE — Progress Notes (Signed)
PARENTERAL NUTRITION CONSULT NOTE - INITIAL and ANTICOAGULATION CONSULT NOTE - Follow-Up  Pharmacy Consult: TPN and Coumadin Indication: Ileus and h/o PE  Allergies  Allergen Reactions  . Avelox [Moxifloxacin Hcl In Nacl] Nausea And Vomiting  . Betadine [Povidone Iodine] Itching and Rash  . Alendronate Sodium Nausea And Vomiting  . Aspirin Nausea Only  . Codeine Nausea And Vomiting  . Doxycycline     Pt doesn't remember reaction  . Fluconazole     Pt doesn't remember reaction  . Hydrocodone Nausea And Vomiting    GI distress  . Neurontin [Gabapentin] Other (See Comments)    Mood changes   . Nsaids Other (See Comments)    Severe gastritis & perforation - avoid NSAIDs when possible  . Sertraline Hcl Other (See Comments)    Hallucinations   . Sulfa Antibiotics Rash    Patient Measurements: Height: 5' 4.96" (165 cm) Weight: 84 lb 3.5 oz (38.2 kg) IBW/kg (Calculated) : 56.91  Vital Signs: Temp: 97.5 F (36.4 C) (08/20 0456) Temp src: Axillary (08/20 0456) BP: 139/62 mmHg (08/20 0456) Pulse Rate: 71 (08/20 0456) Intake/Output from previous day: 08/19 0701 - 08/20 0700 In: 3471.3 [P.O.:240; I.V.:2408; IV Piggyback:400; TPN:423.3] Out: 2225 [Urine:1425; Drains:800] Intake/Output from this shift:    Labs:  Recent Labs  12/08/12 0500 12/09/12 0500 12/10/12 0720  WBC  --  12.9* 6.8  HGB  --  9.8* 10.5*  HCT  --  29.6* 31.4*  PLT  --  536* 483*  INR 2.47* 3.35* 1.89*     Recent Labs  12/09/12 0500 12/10/12 0720  NA 142 138  K 2.2* 3.1*  CL 101 103  CO2 23 26  GLUCOSE 46* 144*  BUN <3* 3*  CREATININE 0.51 0.46*  CALCIUM 8.2* 8.3*  MG 1.3* 1.6  PHOS 3.5 2.0*  PROT  --  6.7  ALBUMIN  --  1.9*  AST  --  13  ALT  --  9  ALKPHOS  --  68  BILITOT  --  0.3  TRIG  --  98   Estimated Creatinine Clearance: 42.3 ml/min (by C-G formula based on Cr of 0.46).   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 s/p exp lap 7/22 with LOA, SBR, wound vac and J-tube placement. Pt on TPN earlier this admit from 7/22-7/27 post-op while waiting for J-tube to be ready to be used. Pt has been on J-tube feedings since 7/27. Pt now with ileus so TF on hold and TPN restarted.  GI: hx GERD. TPN for ileus. TF have been on hold since 8/17 as pt with abd distention with tube feeds. On PPI IV. Prealbumin pending. Pt wt down to 84 lb (baseline wt 95 lb 7/21)  Endo: no hx - CBGs controlled earlier this admit (both on and off TPN). A.m. glucose <150  Lytes: K+ 3.1 - still low even with replacement yesterday. Will replace again today. Also has in NS that is running at 33ml/hr. Mg 1.6 - still lower than goal with ileus even after replacement yesterday. Will replace again today. Phos down to 2 today - will replace. (Goals with ileus K>4 and Mg>2).    Renal: SCr stable, CrCL ~50 ml/min. MIVF NS with KCl at 60 ml/hr.   Pulm: hx COPD. RA  Cards: no hx - VSS  Hepatobil: LFTs wnl, Alb 1.9. TG 98.  Neuro: Hx fibromyalgia, depression - prozac, fent patch  AC: Coumadin for h/o PE, no bleeding. INR now down to 1.89 after dose held yesterday for supratherapeutic level - very large drop past 24 hours. H/H remain stable. Plt high. No bleeding noted.  ID: Ertapenem restarted 8/19 for enteritis. Wbc slightly elevated. Afeb.  Zosyn 7/21 >> 7/31 Ertapenem 7/31>>8/9; 8/19>> Micafungin 8/2 >> 8/11  7/31 peritoneal fluid>> few candida albicans  Best Practices: coumadin  TPN Access: PICC line (triple lumen)   TPN day#: 1 (restart 8/19; off since 7/27)  Current Nutrition:  Clinimix E 5/15 at 85ml/hr + lipids at 66ml/hr provides 1162 kcals and 48 grams protein per day (Goal: Clinimix E 5/15 at 53ml/hr + lipids at 32ml/hr to provide 1672 kCal/d and 84gm of protein per day).  Nutritional  Goals:  Kcal: 1500-1700  Protein: 80-95 gm  Fluid: 1.5-1.7 L  Plan:  -  Continue Clinimix E 5/15 at 28ml/hr + lipids at 61ml/hr. Will not advance today due to electrolyte abnormalities even though pt should not be at risk of refeeding since only off tube feeds x 2 days. Will advance to goal as tolerated. - Magnesium sulfate 4gm IVPB again today - KPhos 20 mmol IVPB today - Potassium chloride IVPB x 4 runs - Daily multivitamin, trace elements, and vit C added to TPN - F/u TPN labs in a.m., prealbumin - Coumadin 5mg  today. F/u daily INR  Christoper Fabian, PharmD, BCPS Clinical pharmacist, pager [phone] 12/10/2012, 9:05 AM

## 2012-12-10 NOTE — Progress Notes (Signed)
Physical Therapy Treatment Patient Details Name: Cynthia Bowen MRN: [id number] DOB: October 17, 1945 Today's Date: 12/10/2012 Time: 4098-1191 PT Time Calculation (min): 43 min  PT Assessment / Plan / Recommendation  History of Present Illness 67 year old female with PMH of COPD and multiple abdominal surgeries who presents to the ICU post abdominal surgery due to 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     PT Comments   Nice progression towards goals. Initially very emotional and discouraged but agreeable to ambulation and higher level balance activities. Still with weak lower extremities. Goals have been updated.   Follow Up Recommendations  Home health PT;Supervision - Intermittent     Does the patient have the potential to tolerate intense rehabilitation     Barriers to Discharge        Equipment Recommendations  None recommended by PT    Recommendations for Other Services    Frequency     Progress towards PT Goals Progress towards PT goals: Progressing toward goals;Goals met and updated - see care plan  Plan Current plan remains appropriate    Precautions / Restrictions Precautions Precautions: None Restrictions Weight Bearing Restrictions: No   Pertinent Vitals/Pain Reports 10/10 pain in her abdomen, RN provided premedication    Mobility  Bed Mobility Bed Mobility: Supine to Sit Supine to Sit: 7: Independent Transfers Transfers: Sit to Stand;Stand to Sit Sit to Stand: 7: Independent Stand to Sit: 7: Independent Ambulation/Gait Ambulation/Gait Assistance: 6: Modified independent (Device/Increase time) Assistive device: None Ambulation/Gait Assistance Details: flexed posture because of discomfort in her abdomen, still has weak appearing gait however slow, no over LOB noted during straight path ambulation without challenges, does need supervision with more dynamic challenges Gait Pattern: Trunk flexed;Decreased stride  length;Step-through pattern Stairs: Yes Stairs Assistance: 5: Supervision Stairs Assistance Details (indicate cue type and reason): cueing to control descent on steps Stair Management Technique: One rail Left Number of Stairs: 15    Exercises General Exercises - Lower Extremity Hip Flexion/Marching: AROM;Both;10 reps;Standing    PT Goals (current goals can now be found in the care plan section) Acute Rehab PT Goals Time For Goal Achievement: 12/17/12  Visit Information  Last PT Received On: 12/10/12 Assistance Needed: +1 History of Present Illness: 67 year old female with PMH of COPD and multiple abdominal surgeries who presents to the ICU post abdominal surgery due to 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      Subjective Data      Cognition  Cognition Arousal/Alertness: Awake/alert Behavior During Therapy: WFL for tasks assessed/performed Overall Cognitive Status: Within Functional Limits for tasks assessed    Balance  Static Standing Balance Single Leg Stance - Right Leg: 7 Single Leg Stance - Left Leg: 2 High Level Balance High Level Balance Activites: Backward walking;Direction changes;Sudden stops;Turns;Head turns High Level Balance Comments: obstacle avoidance, stepping over obstacles with high knees and longer steps; supervision for all exercises, did lose her balance with looking up, no difficulty with horizontal head turns  End of Session PT - End of Session Equipment Utilized During Treatment: Gait belt Activity Tolerance: Patient tolerated treatment well Patient left: in chair;with call bell/phone within reach;with family/visitor present Nurse Communication: Mobility status   GP     Ludger Nutting 12/10/2012, 5:51 PM

## 2012-12-10 NOTE — Progress Notes (Signed)
29 Days Post-Op  Subjective: Having some pain around the RUQ drain site.   + BM Less distention  Objective: Vital signs in last 24 hours: Temp:  [97.5 F (36.4 C)-97.7 F (36.5 C)] 97.5 F (36.4 C) (08/20 0456) Pulse Rate:  [71-81] 71 (08/20 0456) Resp:  [16-17] 16 (08/20 0456) BP: (133-139)/(62-65) 139/62 mmHg (08/20 0456) SpO2:  [94 %-97 %] 95 % (08/20 0456) Weight:  [84 lb 3.5 oz (38.2 kg)] 84 lb 3.5 oz (38.2 kg) (08/20 0456) Last BM Date: 12/09/12  Intake/Output from previous day: 08/19 0701 - 08/20 0700 In: 3471.3 [P.O.:240; I.V.:2408; IV Piggyback:400; TPN:423.3] Out: 2225 [Urine:1425; Drains:800] Intake/Output this shift:    General appearance: alert, cooperative and no distress Resp: clear to auscultation bilaterally Cardio: regular rate and rhythm, S1, S2 normal, no murmur, click, rub or gallop GI: soft, non-distended; skin irritation lateral to the RUQ drain site Incision clean; minimal fistula output; well-granulated; getting smaller  Lab Results:   Recent Labs  12/09/12 0500 12/10/12 0720  WBC 12.9* 6.8  HGB 9.8* 10.5*  HCT 29.6* 31.4*  PLT 536* 483*   BMET  Recent Labs  12/09/12 0500 12/10/12 0720  NA 142 138  K 2.2* 3.1*  CL 101 103  CO2 23 26  GLUCOSE 46* 144*  BUN <3* 3*  CREATININE 0.51 0.46*  CALCIUM 8.2* 8.3*   PT/INR  Recent Labs  12/09/12 0500 12/10/12 0720  LABPROT 32.7* 21.1*  INR 3.35* 1.89*   ABG No results found for this basename: PHART, PCO2, PO2, HCO3,  in the last 72 hours  Studies/Results: Ct Abdomen Pelvis W Contrast  12/08/2012   *RADIOLOGY REPORT*  Clinical Data: Abdominal distention.  Persistent drainage.  New ileus.  Status post exploratory laparotomy with small bowel resection and placement of jejunostomy tube.  Lysis of adhesions.  CT ABDOMEN AND PELVIS WITH CONTRAST  Technique:  Multidetector CT imaging of the abdomen and pelvis was performed following the standard protocol during bolus administration of  intravenous contrast.  Contrast: 80mL OMNIPAQUE IOHEXOL 300 MG/ML  SOLN  Comparison: Acute abdomen series 12/03/2012.  Most recent CT 11/19/2012.  Findings: Lung bases:  Mild left greater right bibasilar airspace disease.  Slightly improved left base aeration, likely due to atelectasis.  Cardiomegaly, with a similar small pericardial effusion.  Small left-sided pleural effusion is similar to minimally decreased.  Abdomen/pelvis:  Normal liver, spleen.  The stomach appears to communicate with the cavity within the right upper quadrant, the site of prior percutaneous drain placement.  Example image 35/series 2.  There is no evidence of residual fluid collection in this area.  This area may communicate with the midline abdominal wound with gas seen within.  Example images 37 - 38/series 2.  Fluid collection anterior to the stomach is nearly completely resolved, with trace fluid and enhancement identified at 1.9 x 1.4 cm on image 30/series 2.  A left upper quadrant subdiaphragmatic collection is decreased and measures 2.3 x 1.1 cm today versus 5.6 x 3.9 cm on the prior.  Normal pancreas, gallbladder, biliary tract, adrenal glands.  Right renal calculi.  Renal cysts and too small to characterize lesions.  Dense atherosclerosis at the origin of the right renal artery and throughout the aorta. No retroperitoneal or retrocrural adenopathy.  The colon is normal in caliber.  Moderate length segment of the mid small bowel wall thickening, including on images 45 - 50/series 2.  Mild small bowel dilatation both proximal and distal to this site, including 3.9 cm  proximally on image 37/series 2 and 4.1 cm distally on image 53/series 2. Enterotomy site identified in the right side of the abdomen on image 46 of series 2.  Decrease size of  ileal mesenteric fluid collection.  Posteriorly, this measures 1.9 cm on image 57 versus 3.7 cm on the prior.  Inferiorly and laterally, this measures 2.1 cm on image 60 versus 2.9 cm on the  prior.  No new fluid collections are identified. No pneumatosis or free intraperitoneal air.  Percutaneous jejunostomy tube is identified.  Distal small bowel loops are relatively decompressed, but no focal transition point is identified.  No pelvic adenopathy.  Hysterectomy.  Normal urinary bladder. Probable right ovarian residual follicle at 2.2 cm.  Trace cul-de- sac fluid.  Bones/Musculoskeletal:  No acute osseous abnormality.  Advanced degenerative disc disease at L4-L5.  IMPRESSION:  1.  Moderate segment mid small bowel wall thickening, most consistent with enteritis.  Favor infection.  Mild proximal mid small bowel dilatation, without focal transition point.  Favor adynamic ileus. 2.  Overall improved appearance of multiple abdominal pelvic fluid collections/abscesses. 3.  Gastric antrum appears to communicate with the right upper quadrant cavity (site of percutaneous drain) and likely the anterior abdominal wall. 4.  Small left pleural effusion with left greater than right bibasilar atelectasis. 5.  Right nephrolithiasis.   Original Report Authenticated By: Jeronimo Greaves, M.D.   Dg Cm Inj Any Colonic Tube W/fluoro  12/09/2012   *RADIOLOGY REPORT*  Clinical Data: Jejunostomy tube replaced.  Confirm position.  CM INJECTION ANY COLONIC TUBE WITH FL  Fluoroscopic time:  36 seconds.  Contrast:  30 ml Omnipaque-300.  Comparison:  12/08/2012 CT.  Findings: Drainage catheter right upper quadrant.  Jejunostomy tube in place.  With instillation of contrast, there is immediate filling of jejunum.  No leak identified.  Jejunal loops appear slightly prominent in size.  IMPRESSION: Jejunostomy tube tip in appropriate position without leak identified.   Original Report Authenticated By: Lacy Duverney, M.D.    Anti-infectives: Anti-infectives   Start     Dose/Rate Route Frequency Ordered Stop   12/09/12 1000  ertapenem (INVANZ) 1 g in sodium chloride 0.9 % 50 mL IVPB     1 g 100 mL/hr over 30 Minutes Intravenous  Every 24 hours 12/09/12 0852     11/22/12 2200  micafungin (MYCAMINE) 100 mg in sodium chloride 0.9 % 100 mL IVPB     100 mg 100 mL/hr over 1 Hours Intravenous Every 24 hours 11/22/12 2103 12/01/12 2215   11/20/12 0730  ertapenem (INVANZ) 1 g in sodium chloride 0.9 % 50 mL IVPB     1 g 100 mL/hr over 30 Minutes Intravenous Every 24 hours 11/20/12 0709 11/29/12 0653   11/10/12 1000  piperacillin-tazobactam (ZOSYN) IVPB 3.375 g  Status:  Discontinued     3.375 g 12.5 mL/hr over 240 Minutes Intravenous 3 times per day 11/10/12 0937 11/20/12 0709      Assessment/Plan: s/p Procedure(s): EXPLORATORY LAPAROTOMY (N/A) SMALL BOWEL RESECTION (N/A) APPLICATION OF WOUND VAC (N/A) PLACEMENT OF FEEDING JEJUNOSTOMY TUBE (N/A) LYSIS OF ADHESION (N/A) Continue abx for enteritis TNA for nutritional support We may try trickle feeds tomorrow. Hypokalemia - replete    LOS: 31 days    Cynthia Betty K. 12/10/2012

## 2012-12-11 LAB — COMPREHENSIVE METABOLIC PANEL
ALT: 8 U/L (ref 0–35)
AST: 13 U/L (ref 0–37)
Alkaline Phosphatase: 69 U/L (ref 39–117)
Calcium: 8.4 mg/dL (ref 8.4–10.5)
GFR calc Af Amer: >90 mL/min (ref >90)
Glucose, Bld: 101 mg/dL — ABNORMAL HIGH (ref 70–99)
Potassium: 3.4 mEq/L — ABNORMAL LOW (ref 3.5–5.1)
Sodium: 137 mEq/L (ref 135–145)
Total Protein: 6.9 g/dL (ref 6.0–8.3)

## 2012-12-11 LAB — PROTIME-INR: INR: 1.39 (ref 0.00–1.49)

## 2012-12-11 MED ORDER — M.V.I. ADULT IV INJ
INJECTION | INTRAVENOUS | Status: AC
  Administered 2012-12-11: 18:00:00 via INTRAVENOUS
  Filled 2012-12-11: qty 2000

## 2012-12-11 MED ORDER — LIDOCAINE HCL 1 % IJ SOLN
15.0000 mL | Freq: Once | INTRAMUSCULAR | Status: DC
  Filled 2012-12-11: qty 15

## 2012-12-11 MED ORDER — POTASSIUM CHLORIDE 10 MEQ/50ML IV SOLN
10.0000 meq | INTRAVENOUS | Status: AC
  Administered 2012-12-11 (×4): 10 meq via INTRAVENOUS
  Filled 2012-12-11 (×4): qty 50

## 2012-12-11 MED ORDER — WARFARIN SODIUM 5 MG PO TABS
5.0000 mg | ORAL_TABLET | Freq: Once | ORAL | Status: AC
  Administered 2012-12-11: 5 mg via ORAL
  Filled 2012-12-11: qty 1

## 2012-12-11 MED ORDER — FAT EMULSION 20 % IV EMUL
240.0000 mL | INTRAVENOUS | Status: AC
  Administered 2012-12-11: 240 mL via INTRAVENOUS
  Filled 2012-12-11: qty 250

## 2012-12-11 NOTE — Progress Notes (Signed)
30 Days Post-Op  Subjective: Feeding tube was dislodged again last night.  Replaced by nursing. Will need to be resutured today. Skin around drain feels better Flatus, but no BM yesterday. Distention resolved.  Objective: Vital signs in last 24 hours: Temp:  [97.7 F (36.5 C)-97.9 F (36.6 C)] 97.9 F (36.6 C) (08/21 0500) Pulse Rate:  [72-76] 72 (08/21 0500) Resp:  [15-17] 16 (08/21 0500) BP: (122-134)/(63-72) 131/69 mmHg (08/21 0500) SpO2:  [96 %-99 %] 97 % (08/21 0500) Weight:  [83 lb 5.3 oz (37.8 kg)] 83 lb 5.3 oz (37.8 kg) (08/21 0500) Last BM Date: 12/10/12  Intake/Output from previous day: 08/20 0701 - 08/21 0700 In: 900 [IV Piggyback:400; TPN:500] Out: 2575 [Urine:2250; Drains:325] Intake/Output this shift:    Resting comfortably Greenish output from RUQ drain - site less erythematous; less leaking around drain Midline wound - granulating very well; smaller; minimal fistula output at upper end of incision with exposed sutures J-tube - reinserted; will suture into place Lab Results:   Recent Labs  12/09/12 0500 12/10/12 0720  WBC 12.9* 6.8  HGB 9.8* 10.5*  HCT 29.6* 31.4*  PLT 536* 483*   BMET  Recent Labs  12/10/12 0720 12/11/12 0500  NA 138 137  K 3.1* 3.4*  CL 103 100  CO2 26 29  GLUCOSE 144* 101*  BUN 3* 5*  CREATININE 0.46* 0.46*  CALCIUM 8.3* 8.4   PT/INR  Recent Labs  12/10/12 0720 12/11/12 0500  LABPROT 21.1* 16.7*  INR 1.89* 1.39   ABG No results found for this basename: PHART, PCO2, PO2, HCO3,  in the last 72 hours  Studies/Results: Dg Cm Inj Any Colonic Tube W/fluoro  12/09/2012   *RADIOLOGY REPORT*  Clinical Data: Jejunostomy tube replaced.  Confirm position.  CM INJECTION ANY COLONIC TUBE WITH FL  Fluoroscopic time:  36 seconds.  Contrast:  30 ml Omnipaque-300.  Comparison:  12/08/2012 CT.  Findings: Drainage catheter right upper quadrant.  Jejunostomy tube in place.  With instillation of contrast, there is immediate filling  of jejunum.  No leak identified.  Jejunal loops appear slightly prominent in size.  IMPRESSION: Jejunostomy tube tip in appropriate position without leak identified.   Original Report Authenticated By: Lacy Duverney, M.D.    Anti-infectives: Anti-infectives   Start     Dose/Rate Route Frequency Ordered Stop   12/09/12 1000  ertapenem (INVANZ) 1 g in sodium chloride 0.9 % 50 mL IVPB     1 g 100 mL/hr over 30 Minutes Intravenous Every 24 hours 12/09/12 0852     11/22/12 2200  micafungin (MYCAMINE) 100 mg in sodium chloride 0.9 % 100 mL IVPB     100 mg 100 mL/hr over 1 Hours Intravenous Every 24 hours 11/22/12 2103 12/01/12 2215   11/20/12 0730  ertapenem (INVANZ) 1 g in sodium chloride 0.9 % 50 mL IVPB     1 g 100 mL/hr over 30 Minutes Intravenous Every 24 hours 11/20/12 0709 11/29/12 0653   11/10/12 1000  piperacillin-tazobactam (ZOSYN) IVPB 3.375 g  Status:  Discontinued     3.375 g 12.5 mL/hr over 240 Minutes Intravenous 3 times per day 11/10/12 0937 11/20/12 0709      Assessment/Plan: s/p Procedure(s): EXPLORATORY LAPAROTOMY (N/A) SMALL BOWEL RESECTION (N/A) APPLICATION OF WOUND VAC (N/A) PLACEMENT OF FEEDING JEJUNOSTOMY TUBE (N/A) LYSIS OF ADHESION (N/A) Continue TNA Suture feeding tube - will start trickle feeds to see if she is able to tolerate Restarted Invanz for enteritis Hypokalemia - improved;  Protein-calorie malnutrition  PE - Coumadin per pharmacy; off Lovenox  If patient is able to tolerate tube feeds, she will be ready for discharge home with [facility] for tube feeds and wound care.  Will follow-up with Dr. Lorin Picket at [facility] regarding future definitive repair of gastrocutaneous fistula.    LOS: 32 days    Rina Adney K. 12/11/2012

## 2012-12-11 NOTE — Progress Notes (Signed)
Physical Therapy Treatment Patient Details Name: ZUNAIRA LAMY MRN: [id number] DOB: 09-Oct-1945 Today's Date: 12/11/2012 Time: 7829-5621 PT Time Calculation (min): 12 min  PT Assessment / Plan / Recommendation  History of Present Illness 67 year old female with PMH of COPD and multiple abdominal surgeries who presents to the ICU post abdominal surgery due to 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     PT Comments   Pt moving well. Educated pt on HEP.    Follow Up Recommendations  Home health PT;Supervision - Intermittent     Does the patient have the potential to tolerate intense rehabilitation     Barriers to Discharge        Equipment Recommendations  None recommended by PT    Recommendations for Other Services    Frequency Min 3X/week   Progress towards PT Goals Progress towards PT goals: Progressing toward goals  Plan Current plan remains appropriate    Precautions / Restrictions Precautions Precautions: None Restrictions Weight Bearing Restrictions: No   Pertinent Vitals/Pain C/o abdominal discomfort.      Mobility  Bed Mobility Bed Mobility: Not assessed Transfers Transfers: Sit to Stand;Stand to Sit Sit to Stand: 7: Independent;With upper extremity assist;From chair/3-in-1 Stand to Sit: 7: Independent;To chair/3-in-1 Ambulation/Gait Ambulation/Gait Assistance: 5: Supervision Ambulation Distance (Feet): 500 Feet Assistive device: None Ambulation/Gait Assistance Details: Supervision for safety due to pt states she feels "groggy".   Gait Pattern: Step-through pattern;Decreased stride length Gait velocity: decreased Stairs: No    Exercises General Exercises - Lower Extremity Ankle Circles/Pumps: AROM;Both;10 reps Long Arc Quad: AROM;Strengthening;Both;10 reps Hip ABduction/ADduction: AROM;Strengthening;Both;10 reps Straight Leg Raises: AROM;Strengthening;Both;10 reps Hip Flexion/Marching:  AROM;Strengthening;Both;10 reps;Standing Toe Raises: AROM;Both;10 reps;Standing Heel Raises: AROM;Both;10 reps;Standing        PT Goals (current goals can now be found in the care plan section) Acute Rehab PT Goals PT Goal Formulation: With patient Time For Goal Achievement: 12/17/12 Potential to Achieve Goals: Good  Visit Information  Last PT Received On: 12/11/12 Assistance Needed: +1 History of Present Illness: 67 year old female with PMH of COPD and multiple abdominal surgeries who presents to the ICU post abdominal surgery due to 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      Subjective Data      Cognition  Cognition Arousal/Alertness: Awake/alert Behavior During Therapy: WFL for tasks assessed/performed Overall Cognitive Status: Within Functional Limits for tasks assessed    Balance     End of Session PT - End of Session Activity Tolerance: Patient tolerated treatment well Patient left: in chair;with call bell/phone within reach Nurse Communication: Mobility status   GP     Lara Mulch 12/11/2012, 3:35 PM  Verdell Face, PTA [phone] 12/11/2012

## 2012-12-11 NOTE — Progress Notes (Signed)
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   S/P exploratory laparotomy with lysis of adhesions, small bowel resection, and application of wound VAC 7/22. CT aspiration of abdominal fluid collection 7/31.   CT Abdomen/pelvis ---> small bowel wall thickening, most consistent with enteritis; favoring infection and ileus.  J-tube dislodged last night.  Will need to be resutured today.  + flatus, no BM yesterday.  Patient is receiving TPN with Clinimix E 5/15 @ 70 ml/hr and lipids @ 10 ml/hr. Provides 1673 kcals and 84 grams protein per day. Meets 100% minimum estimated energy needs and 100% minimum estimated protein needs.  Height: Ht Readings from Last 1 Encounters:  11/10/12 5' 4.96" (1.65 m)    Weight Status:   Wt Readings from Last 1 Encounters:  12/11/12 82 lb 9.6 oz (37.467 kg)    Body mass index is 13.76 kg/(m^2).  Re-estimated needs:  Kcal: 1500-1700 Protein: 80-95 gm Fluid: 1.5-1.7 L  Skin: bilateral abdominal incision   Diet Order: NPO   Intake/Output Summary (Last 24 hours) at 12/11/12 1248 Last data filed at 12/11/12 0500  Gross per 24 hour  Intake    900 ml  Output   1775 ml  Net   -875 ml    Labs:   Recent Labs Lab 12/09/12 0500 12/10/12 0720 12/11/12 0500  NA 142 138 137  K 2.2* 3.1* 3.4*  CL 101 103 100  CO2 23 26 29   BUN <3* 3*  5*  CREATININE 0.51 0.46* 0.46*  CALCIUM 8.2* 8.3* 8.4  MG 1.3* 1.6 2.0  PHOS 3.5 2.0* 3.3  GLUCOSE 46* 144* 101*    Scheduled Meds: . ertapenem  1 g Intravenous Q24H  . feeding supplement (VITAL AF 1.2 CAL)  1,000 mL Per Tube Q24H  . fentaNYL  100 mcg Transdermal Q72H  . FLUoxetine  20 mg Per Tube Daily  . lidocaine  15 mL Intradermal Once  . metoCLOPramide (REGLAN) injection  10 mg Intravenous Q6H  . pantoprazole (PROTONIX) IV  40 mg Intravenous QHS  . sodium chloride  10-40 mL Intracatheter Q12H  . warfarin  5 mg Oral ONCE-1800  . warfarin   Does not apply Once  . Warfarin - Pharmacist Dosing Inpatient   Does not apply q1800    Continuous Infusions: . 0.9 % NaCl with KCl 40 mEq / L 60 mL/hr at 12/11/12 0859  . Marland KitchenTPN (CLINIMIX-E) Adult 40 mL/hr at 12/10/12 1912   And  . fat emulsion 240 mL (12/10/12 1912)  . Marland KitchenTPN (CLINIMIX-E) Adult     And  . fat emulsion      Maureen Chatters, RD, LDN Pager #: [phone] After-Hours Pager #: [phone]

## 2012-12-11 NOTE — Progress Notes (Signed)
PARENTERAL NUTRITION CONSULT NOTE and ANTICOAGULATION CONSULT NOTE - Follow-Up  Pharmacy Consult: TPN and Coumadin Indication: Ileus and h/o PE  Allergies  Allergen Reactions  . Avelox [Moxifloxacin Hcl In Nacl] Nausea And Vomiting  . Betadine [Povidone Iodine] Itching and Rash  . Alendronate Sodium Nausea And Vomiting  . Aspirin Nausea Only  . Codeine Nausea And Vomiting  . Doxycycline     Pt doesn't remember reaction  . Fluconazole     Pt doesn't remember reaction  . Hydrocodone Nausea And Vomiting    GI distress  . Neurontin [Gabapentin] Other (See Comments)    Mood changes   . Nsaids Other (See Comments)    Severe gastritis & perforation - avoid NSAIDs when possible  . Sertraline Hcl Other (See Comments)    Hallucinations   . Sulfa Antibiotics Rash    Patient Measurements: Height: 5' 4.96" (165 cm) Weight: 83 lb 5.3 oz (37.8 kg) IBW/kg (Calculated) : 56.91  Vital Signs: Temp: 97.9 F (36.6 C) (08/21 0500) Temp src: Oral (08/20 2201) BP: 131/69 mmHg (08/21 0500) Pulse Rate: 72 (08/21 0500) Intake/Output from previous day: 08/20 0701 - 08/21 0700 In: 900 [IV Piggyback:400; TPN:500] Out: 2575 [Urine:2250; Drains:325] Intake/Output from this shift:    Labs:  Recent Labs  12/09/12 0500 12/10/12 0720 12/11/12 0500  WBC 12.9* 6.8  --   HGB 9.8* 10.5*  --   HCT 29.6* 31.4*  --   PLT 536* 483*  --   INR 3.35* 1.89* 1.39     Recent Labs  12/09/12 0500 12/10/12 0720 12/11/12 0500  NA 142 138 137  K 2.2* 3.1* 3.4*  CL 101 103 100  CO2 23 26 29   GLUCOSE 46* 144* 101*  BUN <3* 3* 5*  CREATININE 0.51 0.46* 0.46*  CALCIUM 8.2* 8.3* 8.4  MG 1.3* 1.6 2.0  PHOS 3.5 2.0* 3.3  PROT  --  6.7 6.9  ALBUMIN  --  1.9* 1.8*  AST  --  13 13  ALT  --  9 8  ALKPHOS  --  68 69  BILITOT  --  0.3 0.2*  PREALBUMIN  --  4.9*  --   TRIG  --  98  --    Estimated Creatinine Clearance: 41.8 ml/min (by C-G formula based on Cr of 0.46).   No results found for this  basename: GLUCAP,  in the last 72 hours   Insulin Requirements in the past 24 hours:  None  Assessment:  66 YOF w/ h/o gastrocutaneous fistula on chronic TPN PTA admitted with abd pain. s/p exp lap 7/22 with LOA, SBR, wound vac and J-tube. Pt on TPN earlier this admit 7/22-7/27 post-op while waiting for J-tube to be ready to be used. Pt on J-tube feedings 7/27-8/17. Pt now with ileus so TF on hold and TPN restarted.  GI: hx GERD. TPN for ileus. +BM. Distention better per MD note, may consider trickle TF today. On PPI IV. Prealb 4.9 (low but relatively stable over past 3 wks). Wt down to 84 lb (baseline wt 95 lb 7/21)  Endo: no hx - CBGs controlled earlier this admit (both on and off TPN). am glucose <150  Lytes: K+ 3.4 - still low even with aggressive replacement past 48 hrs. Will replace again today. Also has in NS that is running at 28ml/hr. Mg 2. Phos 3.3. (Goals with ileus K>4 and Mg>2).    Renal: SCr stable, CrCL ~40 ml/min. MIVF: NS with KCl at 60 ml/hr.  Pulm: hx COPD. RA  Cards: no hx - VSS  Hepatobil: LFTs wnl. TG 98.  Neuro: Hx fibromyalgia, depression - prozac, fent patch  AC: Coumadin for h/o PE, no bleeding. INR now down to 1.39 after dose held 8/19 for supratherapeutic level. INR very hard to manage in this pt - probably due to pt on/off TF so varied intake of vit K. H/H remain stable. Plt high. No bleeding noted.  ID: Ertapenem restarted 8/19 for enteritis. Wbc down to nl. Afeb.  Zosyn 7/21 >> 7/31 Ertapenem 7/31>>8/9; 8/19>> Micafungin 8/2 >> 8/11  7/31 peritoneal fluid>> few candida albicans  Best Practices: coumadin  TPN Access: PICC line (triple lumen)   TPN day#: 2 (restart 8/19; off since 7/27)  Current Nutrition:  Clinimix E 5/15 at 1ml/hr + lipids at 82ml/hr provides 1162 kcals and 48 grams protein per day (Goal: Clinimix E 5/15 at 85ml/hr + lipids at 13ml/hr to provide 1672 kCal/d and 84gm of protein per day).  Nutritional Goals:   Kcal: 1500-1700  Protein: 80-95 gm   Plan:  - Increase Clinimix E 5/15 to goal rate of 8ml/hr + lipids at 34ml/hr.  - Will leave MIVF at 21ml/hr since pt neg 1.3L yday - Potassium chloride IVPB x 4 runs - Daily MVI, TE, and vit C added to TPN - F/u BMET, Mg, Phos in a.m. - Coumadin 5mg  again today. F/u daily INR. If INR still subtherapeutic tomorrow, may need to consider adding lovenox bridge back.  Christoper Fabian, PharmD, BCPS Clinical pharmacist, pager [phone] 12/11/2012, 7:18 AM

## 2012-12-11 NOTE — Progress Notes (Signed)
Patient ID: Cynthia Bowen, female   DOB: May 16, 1945, 67 y.o.   MRN: [id number]   Came back to suture the tube into place.  Tube is no longer in the patient.  Apparently has been out for some time.  I attempted to reinsert the tube into the tract, but was unable to insert.  Will send to IR first thing tomorrow for replacement of tube over a wire under fluoro.  [name]. [name], MD, [facility]  General/ Trauma Surgery  12/11/2012 5:26 PM

## 2012-12-12 ENCOUNTER — Inpatient Hospital Stay (HOSPITAL_COMMUNITY): Payer: Medicare Other

## 2012-12-12 LAB — BASIC METABOLIC PANEL
CO2: 27 mEq/L (ref 19–32)
Chloride: 101 mEq/L (ref 96–112)
GFR calc Af Amer: >90 mL/min (ref >90)
Potassium: 4.6 mEq/L (ref 3.5–5.1)
Sodium: 136 mEq/L (ref 135–145)

## 2012-12-12 LAB — PROTIME-INR
INR: 1.27 (ref 0.00–1.49)
Prothrombin Time: 15.6 seconds — ABNORMAL HIGH (ref 11.6–15.2)

## 2012-12-12 LAB — MAGNESIUM: Magnesium: 1.6 mg/dL (ref 1.5–2.5)

## 2012-12-12 LAB — PHOSPHORUS: Phosphorus: 3.7 mg/dL (ref 2.3–4.6)

## 2012-12-12 MED ORDER — WARFARIN SODIUM 5 MG PO TABS
5.0000 mg | ORAL_TABLET | Freq: Once | ORAL | Status: AC
  Administered 2012-12-12: 5 mg via ORAL
  Filled 2012-12-12: qty 1

## 2012-12-12 MED ORDER — TRACE MINERALS CR-CU-F-FE-I-MN-MO-SE-ZN IV SOLN
INTRAVENOUS | Status: AC
  Administered 2012-12-12: 17:00:00 via INTRAVENOUS
  Filled 2012-12-12: qty 2000

## 2012-12-12 MED ORDER — FAT EMULSION 20 % IV EMUL
240.0000 mL | INTRAVENOUS | Status: AC
  Administered 2012-12-12: 240 mL via INTRAVENOUS
  Filled 2012-12-12: qty 250

## 2012-12-12 MED ORDER — HYDROMORPHONE HCL PF 1 MG/ML IJ SOLN
1.0000 mg | Freq: Once | INTRAMUSCULAR | Status: AC
  Administered 2012-12-12: 1 mg via INTRAVENOUS

## 2012-12-12 MED ORDER — IOHEXOL 300 MG/ML  SOLN
50.0000 mL | Freq: Once | INTRAMUSCULAR | Status: AC | PRN
  Administered 2012-12-12: 30 mL via INTRAVENOUS

## 2012-12-12 MED ORDER — HYDROMORPHONE HCL PF 1 MG/ML IJ SOLN
INTRAMUSCULAR | Status: AC
  Filled 2012-12-12: qty 1

## 2012-12-12 MED ORDER — MAGNESIUM SULFATE 40 MG/ML IJ SOLN
2.0000 g | Freq: Once | INTRAMUSCULAR | Status: AC
  Administered 2012-12-12: 2 g via INTRAVENOUS
  Filled 2012-12-12: qty 50

## 2012-12-12 MED ORDER — CHLORHEXIDINE GLUCONATE 4 % EX LIQD
CUTANEOUS | Status: AC
  Administered 2012-12-12: 15:00:00
  Filled 2012-12-12: qty 15

## 2012-12-12 MED ORDER — VITAL AF 1.2 CAL PO LIQD
1000.0000 mL | ORAL | Status: DC
  Administered 2012-12-13: 1000 mL
  Filled 2012-12-12 (×2): qty 1000

## 2012-12-12 NOTE — H&P (Signed)
Cynthia Bowen is an 68 y.o. female.   Chief Complaint: Hx small bowel resection- infarct- 11/11/2012 placement of Jejunal tube then in OR Has dislodged 2x in last few days- replaced by nursing staff and Dr [name] twice Dr [name] was unable to get tube back in tract last pm Has requested IR replace with wire and imaging HPI: small bowel resection; malnutrition; COPD  Past Medical History  Diagnosis Date  . COPD (chronic obstructive pulmonary disease)   . Pneumonia 12-2011  . GERD (gastroesophageal reflux disease)   . Headache(784.0)   . Arthritis     osteoarthritis  . Allergy   . Depression   . Neuromuscular disorder   . Osteoporosis   . Bronchitis     CURRENTLY AS OF 06/30/12 - HAS COUGH AND FINISHED ANTIBIOTIC FOR BRONCHITIS  . Fibromyalgia   . Pain     ABDOMINAL PAIN AND NAUSEA  . Pain     SOMETIMES PAIN RIGHT EAR AND NECK--STATES CAUSED BY A "LUMP" ON BACK OF EAR--USES KENALOG CREAM TOPICALLY AS NEEDED.  Marland Kitchen Gastrocutaneous fistula     Past Surgical History  Procedure Laterality Date  . Abdominal hysterectomy    . Esophagogastroduodenoscopy  04/18/2012    Procedure: ESOPHAGOGASTRODUODENOSCOPY (EGD);  Surgeon: [name], MD;  Location: [facility];  Service: Endoscopy;  Laterality: N/A;  . Eus  05/29/2012    Procedure: UPPER ENDOSCOPIC ULTRASOUND (EUS) LINEAR;  Surgeon: [name], MD;  Location: [facility];  Service: Endoscopy;  Laterality: N/A;  . Appendectomy    . Spine surgery      CERVICAL SPINE SURGERY X 2 - INCLUDING FUSION; LUMBAR SURGERY FOR RUPTURED DISC  . Eye surgery      BILATERAL CATARACT EXTRACTIONS  . Laparoscopic abdominal exploration N/A 07/02/2012    Procedure: converted to laparotomy with gastric biopsy;  Surgeon: [name]. [name], MD;  Location: [facility];  Service: General;  Laterality: N/A;  Laparoscopic Gastric Biospy attempted.   . Laparotomy N/A 07/07/2012    Procedure: EXPLORATORY LAPAROTOMY repair of gastric perforation with omental graham  patch, drainage of abdominal abcess;  Surgeon: [name]. [name], MD;  Location: [facility];  Service: General;  Laterality: N/A;  . Stomach surgery  07/07/2012    Omental patch of gastric perforation  . Laparotomy N/A 07/13/2012    Procedure: EXPLORATORY LAPAROTOMY repair gastric leak;  Surgeon: Mariella Saa, MD;  Location: [facility];  Service: General;  Laterality: N/A;  . Laparotomy N/A 11/11/2012    Procedure: EXPLORATORY LAPAROTOMY;  Surgeon: [name]. [name], MD;  Location: [facility];  Service: General;  Laterality: N/A;  . Bowel resection N/A 11/11/2012    Procedure: SMALL BOWEL RESECTION;  Surgeon: [name]. [name], MD;  Location: [facility];  Service: General;  Laterality: N/A;  . Minor application of wound vac N/A 11/11/2012    Procedure: APPLICATION OF WOUND VAC;  Surgeon: [name]. [name], MD;  Location: [facility];  Service: General;  Laterality: N/A;  . Jejunostomy N/A 11/11/2012    Procedure: PLACEMENT OF FEEDING JEJUNOSTOMY TUBE;  Surgeon: [name]. [name], MD;  Location: [facility];  Service: General;  Laterality: N/A;  . Lysis of adhesion N/A 11/11/2012    Procedure: LYSIS OF ADHESION;  Surgeon: [name]. [name], MD;  Location: [facility];  Service: General;  Laterality: N/A;    Family History  Problem Relation Age of Onset  . COPD Mother   . Hypertension Maternal Grandmother    Social History:  reports that she  quit smoking about 7 years ago. Her smoking use included Cigarettes. She started smoking about 9 years ago. She has a 17.5 pack-year smoking history. She has never used smokeless tobacco. She reports that she does not drink alcohol or use illicit drugs.  Allergies:  Allergies  Allergen Reactions  . Avelox [Moxifloxacin Hcl In Nacl] Nausea And Vomiting  . Betadine [Povidone Iodine] Itching and Rash  . Alendronate Sodium Nausea And Vomiting  . Aspirin Nausea Only  . Codeine Nausea And Vomiting  . Doxycycline     Pt doesn't remember reaction  . Fluconazole     Pt doesn't remember reaction   . Hydrocodone Nausea And Vomiting    GI distress  . Neurontin [Gabapentin] Other (See Comments)    Mood changes   . Nsaids Other (See Comments)    Severe gastritis & perforation - avoid NSAIDs when possible  . Sertraline Hcl Other (See Comments)    Hallucinations   . Sulfa Antibiotics Rash    Medications Prior to Admission  Medication Sig Dispense Refill  . albuterol (PROVENTIL HFA;VENTOLIN HFA) 108 (90 BASE) MCG/ACT inhaler Inhale 2 puffs into the lungs every 4 (four) hours as needed for wheezing or shortness of breath.      . diphenhydrAMINE (BENADRYL) 12.5 MG/5ML liquid Take 12.5 mg by mouth 4 (four) times daily.       Marland Kitchen EPINEPHrine (EPIPEN) 0.3 mg/0.3 mL SOAJ Inject 0.3 mg into the muscle daily as needed (anaphylaxis).      . fentaNYL (DURAGESIC - DOSED MCG/HR) 50 MCG/HR Place 1 patch (50 mcg total) onto the skin every 3 (three) days.  15 patch  0  . fluconazole (DIFLUCAN) 40 MG/ML suspension Take 200 mg by mouth every 7 (seven) days. 4 week course - takes on Saturdays      . FLUoxetine (PROZAC) 20 MG/5ML solution Take 5 mLs (20 mg total) by mouth daily.  120 mL  3  . nystatin-triamcinolone ointment (MYCOLOG) Apply topically 2 (two) times daily.  30 g  0  . ondansetron (ZOFRAN-ODT) 4 MG disintegrating tablet Take 4 mg by mouth 2 (two) times daily as needed for nausea. Takes with percocet      . oxyCODONE-acetaminophen (PERCOCET/ROXICET) 5-325 MG per tablet Take 1 tablet by mouth every 4 (four) hours as needed for pain.  40 tablet  0  . Rivaroxaban (XARELTO) 20 MG TABS Take 20 mg by mouth daily with supper.        Results for orders placed during the hospital encounter of 11/09/12 (from the past 48 hour(s))  PROTIME-INR     Status: Abnormal   Collection Time    12/11/12  5:00 AM      Result Value Range   Prothrombin Time 16.7 (*) 11.6 - 15.2 seconds   INR 1.39  0.00 - 1.49  COMPREHENSIVE METABOLIC PANEL     Status: Abnormal   Collection Time    12/11/12  5:00 AM      Result  Value Range   Sodium 137  135 - 145 mEq/L   Potassium 3.4 (*) 3.5 - 5.1 mEq/L   Chloride 100  96 - 112 mEq/L   CO2 29  19 - 32 mEq/L   Glucose, Bld 101 (*) 70 - 99 mg/dL   BUN 5 (*) 6 - 23 mg/dL   Creatinine, Ser 1.61 (*) 0.50 - 1.10 mg/dL   Calcium 8.4  8.4 - 09.6 mg/dL   Total Protein 6.9  6.0 - 8.3 g/dL   Albumin  1.8 (*) 3.5 - 5.2 g/dL   AST 13  0 - 37 U/L   ALT 8  0 - 35 U/L   Alkaline Phosphatase 69  39 - 117 U/L   Total Bilirubin 0.2 (*) 0.3 - 1.2 mg/dL   GFR calc non Af Amer >90  >90 mL/min   GFR calc Af Amer >90  >90 mL/min   Comment: (NOTE)     The eGFR has been calculated using the CKD EPI equation.     This calculation has not been validated in all clinical situations.     eGFR's persistently <90 mL/min signify possible Chronic Kidney     Disease.  MAGNESIUM     Status: None   Collection Time    12/11/12  5:00 AM      Result Value Range   Magnesium 2.0  1.5 - 2.5 mg/dL  PHOSPHORUS     Status: None   Collection Time    12/11/12  5:00 AM      Result Value Range   Phosphorus 3.3  2.3 - 4.6 mg/dL  PROTIME-INR     Status: Abnormal   Collection Time    12/12/12  6:19 AM      Result Value Range   Prothrombin Time 15.6 (*) 11.6 - 15.2 seconds   INR 1.27  0.00 - 1.49  BASIC METABOLIC PANEL     Status: Abnormal   Collection Time    12/12/12  6:19 AM      Result Value Range   Sodium 136  135 - 145 mEq/L   Potassium 4.6  3.5 - 5.1 mEq/L   Chloride 101  96 - 112 mEq/L   CO2 27  19 - 32 mEq/L   Glucose, Bld 93  70 - 99 mg/dL   BUN 9  6 - 23 mg/dL   Creatinine, Ser 1.61 (*) 0.50 - 1.10 mg/dL   Calcium 9.0  8.4 - 09.6 mg/dL   GFR calc non Af Amer >90  >90 mL/min   GFR calc Af Amer >90  >90 mL/min   Comment: (NOTE)     The eGFR has been calculated using the CKD EPI equation.     This calculation has not been validated in all clinical situations.     eGFR's persistently <90 mL/min signify possible Chronic Kidney     Disease.  MAGNESIUM     Status: None   Collection  Time    12/12/12  6:19 AM      Result Value Range   Magnesium 1.6  1.5 - 2.5 mg/dL  PHOSPHORUS     Status: None   Collection Time    12/12/12  6:19 AM      Result Value Range   Phosphorus 3.7  2.3 - 4.6 mg/dL   No results found.  Review of Systems  Constitutional: Positive for weight loss. Negative for fever.  Respiratory: Negative for shortness of breath.   Cardiovascular: Negative for chest pain.  Gastrointestinal: Positive for nausea and abdominal pain. Negative for vomiting.  Neurological: Positive for weakness.    Blood pressure 125/62, pulse 80, temperature 97.8 F (36.6 C), temperature source Oral, resp. rate 16, height 5' 4.96" (1.65 m), weight 82 lb 9.6 oz (37.467 kg), SpO2 94.00%. Physical Exam  Constitutional: She is oriented to person, place, and time.  Thin/frail  Cardiovascular: Normal rate and regular rhythm.   Respiratory: Effort normal and breath sounds normal.  GI: Soft. Bowel sounds are normal. There is tenderness.  Existing  Rt abd abscess drain Midline incision- healing Nothing in previous J tube site- site clean and dry  Musculoskeletal: Normal range of motion.  Neurological: She is alert and oriented to person, place, and time.  Skin: Skin is dry.  Psychiatric: She has a normal mood and affect. Her behavior is normal. Judgment normal.     Assessment/Plan Jejunal tube placed in OR 11/11/2012 Dislodged 2 x in last few days Replaced twice with RN and Dr [name] Dislodged again last pm- MD unable to get tube back in place Request for replacement in IR Pt aware of procedure bebefits and risks and agreeable to proceed Consent signed and in chart  Prospero Mahnke A 12/12/2012, 8:31 AM

## 2012-12-12 NOTE — Progress Notes (Addendum)
PARENTERAL NUTRITION CONSULT NOTE and ANTICOAGULATION CONSULT NOTE - Follow-Up  Pharmacy Consult: TPN and Coumadin Indication: Ileus and h/o PE  Allergies  Allergen Reactions  . Avelox [Moxifloxacin Hcl In Nacl] Nausea And Vomiting  . Betadine [Povidone Iodine] Itching and Rash  . Alendronate Sodium Nausea And Vomiting  . Aspirin Nausea Only  . Codeine Nausea And Vomiting  . Doxycycline     Pt doesn't remember reaction  . Fluconazole     Pt doesn't remember reaction  . Hydrocodone Nausea And Vomiting    GI distress  . Neurontin [Gabapentin] Other (See Comments)    Mood changes   . Nsaids Other (See Comments)    Severe gastritis & perforation - avoid NSAIDs when possible  . Sertraline Hcl Other (See Comments)    Hallucinations   . Sulfa Antibiotics Rash   Patient Measurements: Height: 5' 4.96" (165 cm) Weight: 82 lb 9.6 oz (37.467 kg) IBW/kg (Calculated) : 56.91  Vital Signs: Temp: 97.8 F (36.6 C) (08/22 0644) Temp src: Oral (08/22 0644) BP: 125/62 mmHg (08/22 0644) Pulse Rate: 80 (08/22 0644) Intake/Output from previous day: 08/21 0701 - 08/22 0700 In: 10 [I.V.:10] Out: 875 [Urine:450; Drains:425] Intake/Output from this shift:   Labs:  Recent Labs  12/10/12 0720 12/11/12 0500 12/12/12 0619  WBC 6.8  --   --   HGB 10.5*  --   --   HCT 31.4*  --   --   PLT 483*  --   --   INR 1.89* 1.39 1.27    Recent Labs  12/10/12 0720 12/11/12 0500 12/12/12 0619  NA 138 137 136  K 3.1* 3.4* 4.6  CL 103 100 101  CO2 26 29 27   GLUCOSE 144* 101* 93  BUN 3* 5* 9  CREATININE 0.46* 0.46* 0.49*  CALCIUM 8.3* 8.4 9.0  MG 1.6 2.0 1.6  PHOS 2.0* 3.3 3.7  PROT 6.7 6.9  --   ALBUMIN 1.9* 1.8*  --   AST 13 13  --   ALT 9 8  --   ALKPHOS 68 69  --   BILITOT 0.3 0.2*  --   PREALBUMIN 4.9*  --   --   TRIG 98  --   --    Estimated Creatinine Clearance: 41.5 ml/min (by C-G formula based on Cr of 0.49).   Insulin Requirements in the past 24 hours:  None - CBG's are  well controlled and all < 150 the last 24 hours.  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 s/p exp lap 7/22 with LOA, SBR, wound vac and J-tube. Pt on TPN earlier this admit 7/22-7/27 post-op while waiting for J-tube to be ready to be used. Pt on J-tube feedings 7/27-8/17. Pt now with ileus so TF on hold and TPN restarted.  GI: hx GERD. TPN for ileus. +BM. Distention better per MD note, may consider trickle TF today. On PPI IV. Prealb 4.9 (low but relatively stable over past 3 wks). Wt down to 84 lb (baseline wt 95 lb 7/21)  Endo: no hx - CBGs controlled earlier this admit (both on and off TPN). am glucose <150  Lytes: K+ 3.4 - still low even with aggressive replacement past 48 hrs. Will replace again today. Also has in NS that is running at 47ml/hr. Mg 2. Phos 3.3. (Goals with ileus K>4 and Mg>2).    Renal: SCr stable, CrCL ~40 ml/min. MIVF: NS with KCl at 60 ml/hr.  Pulm: hx COPD. RA  Cards: no hx - VSS  Hepatobil: LFTs wnl. TG 98.  Neuro: Hx fibromyalgia, depression - prozac, fent patch  AC: Coumadin for h/o PE, no bleeding. INR now down to 1.2 after dose held 8/19 for supratherapeutic level. INR very hard to manage in this pt - probably due to pt on/off TF so varied intake of vit K. H/H remain stable. Plt high. No bleeding noted.  ID: Ertapenem restarted 8/19 for enteritis. Wbc down to nl. Afeb.  Zosyn 7/21 >> 7/31 Ertapenem 7/31>>8/9; 8/19>> Micafungin 8/2 >> 8/11  7/31 peritoneal fluid>> few candida albicans  Best Practices: coumadin  TPN Access: PICC line (triple lumen)   TPN day#: 3 (restart 8/19; off since 7/27)  Current Nutrition:  Clinimix E 5/15 at 40ml/hr + lipids at 36ml/hr provides 1162 kcals and 48 grams protein per day (Goal: Clinimix E 5/15 at 20ml/hr + lipids at 4ml/hr to provide 1672 kCal/d and 84gm of protein per day).  Nutritional Goals:  Kcal: 1500-1700  Protein: 80-95 gm   Plan:  -  Continue Clinimix E 5/15 to goal rate of 68ml/hr + lipids at 29ml/hr.  - Will decreased MIVF to 67ml/hr since pt continues to be slightly negative with I&O's. - Magnesium 2 gm bolus x 1 - Daily MVI, TE, and vit C added to TPN - F/u BMET, Mg - Coumadin 5mg  again today. F/u daily INR.  - Will not restart enoxaparin today pending J-tube placement, will consider tomorrow if she is still sub-therapeutic with her Warfarin.   Nadara Mustard, PharmD., MS Clinical Pharmacist Pager:  [phone] Thank you for allowing pharmacy to be part of this patients care team. 12/12/2012, 9:30 AM

## 2012-12-12 NOTE — Progress Notes (Signed)
Pt complaining of some tenderness to touch at the PICC site.  Site is unremarkable, no signs of infection (drainage, moisture, swelling, redness etc).  Collaborated with Charletta Cousin RN IV team PICC nurse, regarding pt's concern, advised that we continue to observe the site for changes in appearance and pain.  Consuello Masse

## 2012-12-12 NOTE — Progress Notes (Signed)
31 Days Post-Op  Subjective: Patient feeling well today.  + BM Awaiting replacement of jejunostomy tube   Vitals in last 24 hours: Temp:  [97.8 F (36.6 C)-98.3 F (36.8 C)] 97.8 F (36.6 C) (08/22 0644) Pulse Rate:  [77-88] 80 (08/22 0644) Resp:  [15-16] 16 (08/22 0644) BP: (125-133)/(58-76) 125/62 mmHg (08/22 0644) SpO2:  [94 %-100 %] 94 % (08/22 0644) Last BM Date: 12/11/12  Intake/Output from previous day: 08/21 0701 - 08/22 0700 In: 10 [I.V.:10] Out: 875 [Urine:450; Drains:425] Intake/Output this shift:    General appearance: alert, cooperative and no distress Resp: clear to auscultation bilaterally Cardio: regular rate and rhythm, S1, S2 normal, no murmur, click, rub or gallop GI: soft, greenish drainage from RUQ drain Wound - very clean; very minimal drainage from fistula; getting smaller  Lab Results:   Recent Labs  12/10/12 0720  WBC 6.8  HGB 10.5*  HCT 31.4*  PLT 483*   BMET  Recent Labs  12/11/12 0500 12/12/12 0619  NA 137 136  Bowen 3.4* 4.6  CL 100 101  CO2 29 27  GLUCOSE 101* 93  BUN 5* 9  CREATININE 0.46* 0.49*  CALCIUM 8.4 9.0   PT/INR  Recent Labs  12/11/12 0500 12/12/12 0619  LABPROT 16.7* 15.6*  INR 1.39 1.27   ABG No results found for this basename: PHART, PCO2, PO2, HCO3,  in the last 72 hours  Studies/Results: No results found.  Anti-infectives: Anti-infectives   Start     Dose/Rate Route Frequency Ordered Stop   12/09/12 1000  ertapenem (INVANZ) 1 g in sodium chloride 0.9 % 50 mL IVPB     1 g 100 mL/hr over 30 Minutes Intravenous Every 24 hours 12/09/12 0852     11/22/12 2200  micafungin (MYCAMINE) 100 mg in sodium chloride 0.9 % 100 mL IVPB     100 mg 100 mL/hr over 1 Hours Intravenous Every 24 hours 11/22/12 2103 12/01/12 2215   11/20/12 0730  ertapenem (INVANZ) 1 g in sodium chloride 0.9 % 50 mL IVPB     1 g 100 mL/hr over 30 Minutes Intravenous Every 24 hours 11/20/12 0709 11/29/12 0653   11/10/12 1000   piperacillin-tazobactam (ZOSYN) IVPB 3.375 g  Status:  Discontinued     3.375 g 12.5 mL/hr over 240 Minutes Intravenous 3 times per day 11/10/12 0937 11/20/12 0709      Assessment/Plan: s/p Procedure(s): EXPLORATORY LAPAROTOMY (N/A) SMALL BOWEL RESECTION (N/A) APPLICATION OF WOUND VAC (N/A) PLACEMENT OF FEEDING JEJUNOSTOMY TUBE (N/A) LYSIS OF ADHESION (N/A) Feeding tube today Trickle feeds Continue TNA for now Invanz for enteritis Electrolytes WNL   LOS: 33 days    Cynthia Bowen. 12/12/2012

## 2012-12-13 MED ORDER — VITAL AF 1.2 CAL PO LIQD
1000.0000 mL | ORAL | Status: DC
  Filled 2012-12-13 (×2): qty 1000

## 2012-12-13 MED ORDER — FAT EMULSION 20 % IV EMUL
240.0000 mL | INTRAVENOUS | Status: AC
  Administered 2012-12-13: 240 mL via INTRAVENOUS
  Filled 2012-12-13: qty 250

## 2012-12-13 MED ORDER — TRACE MINERALS CR-CU-F-FE-I-MN-MO-SE-ZN IV SOLN
INTRAVENOUS | Status: AC
  Administered 2012-12-13: 18:00:00 via INTRAVENOUS
  Filled 2012-12-13: qty 2000

## 2012-12-13 MED ORDER — ALTEPLASE 2 MG IJ SOLR
2.0000 mg | Freq: Once | INTRAMUSCULAR | Status: AC
  Administered 2012-12-13: 2 mg
  Filled 2012-12-13: qty 2

## 2012-12-13 MED ORDER — WARFARIN SODIUM 6 MG PO TABS
6.0000 mg | ORAL_TABLET | Freq: Once | ORAL | Status: AC
  Administered 2012-12-13: 6 mg via ORAL
  Filled 2012-12-13: qty 1

## 2012-12-13 MED ORDER — DIPHENHYDRAMINE HCL 50 MG/ML IJ SOLN
12.5000 mg | Freq: Four times a day (QID) | INTRAMUSCULAR | Status: DC | PRN
  Administered 2012-12-13: 12.5 mg via INTRAVENOUS
  Filled 2012-12-13: qty 1

## 2012-12-13 NOTE — Progress Notes (Signed)
PARENTERAL NUTRITION CONSULT NOTE and ANTICOAGULATION CONSULT NOTE - Follow-Up  Pharmacy Consult: TPN and Coumadin Indication: Ileus and h/o PE  Allergies  Allergen Reactions  . Avelox [Moxifloxacin Hcl In Nacl] Nausea And Vomiting  . Betadine [Povidone Iodine] Itching and Rash  . Alendronate Sodium Nausea And Vomiting  . Aspirin Nausea Only  . Codeine Nausea And Vomiting  . Doxycycline     Pt doesn't remember reaction  . Fluconazole     Pt doesn't remember reaction  . Hydrocodone Nausea And Vomiting    GI distress  . Neurontin [Gabapentin] Other (See Comments)    Mood changes   . Nsaids Other (See Comments)    Severe gastritis & perforation - avoid NSAIDs when possible  . Sertraline Hcl Other (See Comments)    Hallucinations   . Sulfa Antibiotics Rash   Patient Measurements: Height: 5' 4.96" (165 cm) Weight: 82 lb 9.6 oz (37.467 kg) IBW/kg (Calculated) : 56.91  Vital Signs: Temp: 98 F (36.7 C) (08/23 0512) Temp src: Oral (08/23 0512) BP: 128/79 mmHg (08/23 0512) Pulse Rate: 92 (08/23 0512) Intake/Output from previous day: 08/22 0701 - 08/23 0700 In: 2025 [I.V.:640; NG/GT:160; TPN:1140] Out: 3065 [Urine:2800; Drains:265] Intake/Output from this shift:   Labs:  Recent Labs  12/11/12 0500 12/12/12 0619  INR 1.39 1.27    Recent Labs  12/11/12 0500 12/12/12 0619  NA 137 136  K 3.4* 4.6  CL 100 101  CO2 29 27  GLUCOSE 101* 93  BUN 5* 9  CREATININE 0.46* 0.49*  CALCIUM 8.4 9.0  MG 2.0 1.6  PHOS 3.3 3.7  PROT 6.9  --   ALBUMIN 1.8*  --   AST 13  --   ALT 8  --   ALKPHOS 69  --   BILITOT 0.2*  --    Estimated Creatinine Clearance: 41.5 ml/min (by C-G formula based on Cr of 0.49).   Insulin Requirements in the past 24 hours:  None - CBG's are well controlled and all < 150 the last 24 hours.  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  She was admitted with abd pain. s/p exp lap 7/22 with LOA, SBR, wound vac and  J-tube. Pt on TPN earlier this admit 7/22-7/27 post-op while waiting for J-tube to be ready to be used. Pt on J-tube feedings 7/27-8/17. Pt now with ileus so TF on hold and TPN restarted.  GI: hx GERD. TPN for ileus. +BM - last charted 8/22. Distention better per MD note, may consider trickle TF today. On PPI IV. Prealb 4.9 (low but relatively stable over past 3 wks). Wt down to 84 lb (baseline wt 95 lb 7/21).   Has gastrostomy/enterostomy Jejunostomy RUQ 8/22 under [facility] and ok to use -hopefully can restart TF's today  Endo: no hx - CBGs controlled earlier this admit (both on and off TPN). am glucose <150  Lytes: No labs this morning - hypokalemia/hypomagnesemia with supplementation as needed.    Renal: SCr stable, CrCL ~40 ml/min. MIVF: NS with KCl at 60 ml/hr.   Pulm: hx COPD. RA  Cards: no hx - VSS  Hepatobil: LFTs wnl. TG 98.  Neuro: Hx fibromyalgia, depression - prozac, fent patch  AC: Coumadin for h/o PE, no bleeding. INR not drawn yet this AM - will check later for INR and dose accordingly.  Dose held 8/19 for supratherapeutic level. INR very hard to manage in this pt - probably due to pt on/off TF so varied  intake of vit K. H/H remain stable. Plt high. No bleeding noted.  ID: Ertapenem restarted 8/19 for enteritis. Wbc down to nl. Afeb.  Zosyn 7/21 >> 7/31 Ertapenem 7/31>>8/9; 8/19>> Micafungin 8/2 >> 8/11  7/31 peritoneal fluid>> few candida albicans  Best Practices: coumadin  TPN Access: PICC line (triple lumen) 7/23>>  Now with complaints of tenderness but no s/s of infection or erythema noted per RN.   TPN day#: 3 (restart 8/19; off since 7/27)  Current Nutrition:  Clinimix E 5/15 at 85ml/hr + lipids at 60ml/hr provides 1162 kcals and 48 grams protein per day (Goal: Clinimix E 5/15 at 34ml/hr + lipids at 5ml/hr to provide 1672 kCal/d and 84gm of protein per day).  Nutritional Goals:  Kcal: 1500-1700  Protein: 80-95 gm   Plan:  - Continue Clinimix E 5/15  to goal rate of 24ml/hr + lipids at 29ml/hr.  - Will increase MIVF to 20ml/hr since pt continues to be negative with I&O's. - Daily MVI, TE, and vit C added to TPN - F/u BMET, Mg in AM  Nadara Mustard, PharmD., MS Clinical Pharmacist Pager:  [phone] Thank you for allowing pharmacy to be part of this patients care team. 12/13/2012, 8:11 AM

## 2012-12-13 NOTE — Progress Notes (Signed)
32 Days Post-Op  Subjective: Tolerated tube feeds at 10 ml/hr + BM No increase in pain  Objective: Vital signs in last 24 hours: Temp:  [98 F (36.7 C)] 98 F (36.7 C) (08/23 0512) Pulse Rate:  [80-92] 92 (08/23 0512) Resp:  [16] 16 (08/23 0512) BP: (127-128)/(50-79) 128/79 mmHg (08/23 0512) SpO2:  [97 %-100 %] 100 % (08/23 0512) Last BM Date: 12/12/12  Intake/Output from previous day: 08/22 0701 - 08/23 0700 In: 2025 [I.V.:640; NG/GT:160; TPN:1140] Out: 3065 [Urine:2800; Drains:265] Intake/Output this shift: Total I/O In: 10 [I.V.:10] Out: -   General appearance: alert, cooperative and no distress Resp: clear to auscultation bilaterally GI: soft, + BS; drain - greenish drainage Midline wound - very clean; no fistula output; 6 x 2 cm  Lab Results:  No results found for this basename: WBC, HGB, HCT, PLT,  in the last 72 hours BMET  Recent Labs  12/11/12 0500 12/12/12 0619  NA 137 136  K 3.4* 4.6  CL 100 101  CO2 29 27  GLUCOSE 101* 93  BUN 5* 9  CREATININE 0.46* 0.49*  CALCIUM 8.4 9.0   PT/INR  Recent Labs  12/11/12 0500 12/12/12 0619  LABPROT 16.7* 15.6*  INR 1.39 1.27   ABG No results found for this basename: PHART, PCO2, PO2, HCO3,  in the last 72 hours  Studies/Results: Ir Replc Duoden/jejuno Tube Percut W/fluoro  12/12/2012   *RADIOLOGY REPORT*  Clinical data:  Recurrent dislodgement of surgically placed jejunostomy catheter.  REPLACEMENT OF JEJUNOSTOMY CATHETER UNDER FLUOROSCOPY  Technique and findings: The procedure, risks (including but not limited to bleeding, infection, organ damage), benefits, and alternatives were explained to the patient.  Questions regarding the procedure were encouraged and answered.  The patient understands and consents to the procedure.The site of the previous jejunostomy catheter in the left mid abdomen was prepped with Betadine, draped in usual sterile fashion.  Viscous lidocaine was liberally applied around the site.   Under fluoroscopic guidance, a 5-French catheter was advanced partially into the tract.  Contrast injection opacified the tract to the small bowel.  An angled glide wire advanced into the lumen of the small bowel.  Over the guide wire, the tract was dilated to facilitate advancement of an 79- French balloon retention single-lumen jejunostomy catheter. Contrast injection confirmed appropriate patency and intraluminal position.  The retention balloon was inflated with  7 ml sterile saline.  The catheter was flushed and capped. The patient tolerated the procedure well.  No immediate complication.  IMPRESSION: Technically successful replacement of jejunostomy catheter with a 18-French balloon retention device, okay for routine use.   Original Report Authenticated By: D. Andria Rhein, MD    Anti-infectives: Anti-infectives   Start     Dose/Rate Route Frequency Ordered Stop   12/09/12 1000  ertapenem (INVANZ) 1 g in sodium chloride 0.9 % 50 mL IVPB     1 g 100 mL/hr over 30 Minutes Intravenous Every 24 hours 12/09/12 0852     11/22/12 2200  micafungin (MYCAMINE) 100 mg in sodium chloride 0.9 % 100 mL IVPB     100 mg 100 mL/hr over 1 Hours Intravenous Every 24 hours 11/22/12 2103 12/01/12 2215   11/20/12 0730  ertapenem (INVANZ) 1 g in sodium chloride 0.9 % 50 mL IVPB     1 g 100 mL/hr over 30 Minutes Intravenous Every 24 hours 11/20/12 0709 11/29/12 0653   11/10/12 1000  piperacillin-tazobactam (ZOSYN) IVPB 3.375 g  Status:  Discontinued  3.375 g 12.5 mL/hr over 240 Minutes Intravenous 3 times per day 11/10/12 0937 11/20/12 0709      Assessment/Plan: s/p Procedure(s): EXPLORATORY LAPAROTOMY (N/A) SMALL BOWEL RESECTION (N/A) APPLICATION OF WOUND VAC (N/A) PLACEMENT OF FEEDING JEJUNOSTOMY TUBE (N/A) LYSIS OF ADHESION (N/A) Advance tube feeds slowly; once at goal, wean off TNA and discharge home  LOS: 34 days    Kendell Sagraves K. 12/13/2012

## 2012-12-13 NOTE — Progress Notes (Signed)
ANTICOAGULATION CONSULT NOTE - Follow Up Consult  Pharmacy Consult for warfarin Indication: h/o PE  AC: Coumadin for h/o PE, no bleeding. INR drawn late, reported as 1.2. Dose held 8/19 for supratherapeutic level. INR very hard to manage in this pt - probably due to pt on/off TF so varied intake of vit K > currently INR not rising after 3 doses of 5 mg will give 6 mg tonight H/H remain stable. Plt high.  Goal of Therapy:  INR 2-3 Monitor platelets by anticoagulation protocol: Yes   Plan:  1. Warfarin 6 mg x 1 2. Daily INR   Allergies  Allergen Reactions  . Avelox [Moxifloxacin Hcl In Nacl] Nausea And Vomiting  . Betadine [Povidone Iodine] Itching and Rash  . Alendronate Sodium Nausea And Vomiting  . Aspirin Nausea Only  . Codeine Nausea And Vomiting  . Doxycycline     Pt doesn't remember reaction  . Fluconazole     Pt doesn't remember reaction  . Hydrocodone Nausea And Vomiting    GI distress  . Neurontin [Gabapentin] Other (See Comments)    Mood changes   . Nsaids Other (See Comments)    Severe gastritis & perforation - avoid NSAIDs when possible  . Sertraline Hcl Other (See Comments)    Hallucinations   . Sulfa Antibiotics Rash    Patient Measurements: Height: 5' 4.96" (165 cm) Weight: 95 lb 8 oz (43.319 kg) IBW/kg (Calculated) : 56.91   Vital Signs: Temp: 98.3 F (36.8 C) (08/23 1315) Temp src: Oral (08/23 0512) BP: 105/71 mmHg (08/23 1315) Pulse Rate: 103 (08/23 1315)  Labs:  Recent Labs  12/11/12 0500 12/12/12 0619 12/13/12 1305  LABPROT 16.7* 15.6* 14.9  INR 1.39 1.27 1.20  CREATININE 0.46* 0.49*  --     Estimated Creatinine Clearance: 47.9 ml/min (by C-G formula based on Cr of 0.49).   Thank you for allowing pharmacy to be a part of this patients care team.  Lovenia [name] Pharm.D., BCPS Clinical Pharmacist 12/13/2012 5:11 PM Pager: [phone] Phone: [phone]

## 2012-12-14 LAB — BASIC METABOLIC PANEL
BUN: 16 mg/dL (ref 6–23)
CO2: 30 mEq/L (ref 19–32)
Chloride: 96 mEq/L (ref 96–112)
Creatinine, Ser: 0.49 mg/dL — ABNORMAL LOW (ref 0.50–1.10)
GFR calc Af Amer: >90 mL/min (ref >90)
Glucose, Bld: 111 mg/dL — ABNORMAL HIGH (ref 70–99)
Potassium: 4.9 mEq/L (ref 3.5–5.1)

## 2012-12-14 LAB — PROTIME-INR: Prothrombin Time: 15.7 seconds — ABNORMAL HIGH (ref 11.6–15.2)

## 2012-12-14 MED ORDER — TRACE MINERALS CR-CU-F-FE-I-MN-MO-SE-ZN IV SOLN
INTRAVENOUS | Status: AC
  Administered 2012-12-14: 19:00:00 via INTRAVENOUS
  Filled 2012-12-14: qty 1000

## 2012-12-14 MED ORDER — WARFARIN SODIUM 6 MG PO TABS
6.0000 mg | ORAL_TABLET | Freq: Once | ORAL | Status: AC
  Administered 2012-12-14: 6 mg via ORAL
  Filled 2012-12-14: qty 1

## 2012-12-14 MED ORDER — FAT EMULSION 20 % IV EMUL
240.0000 mL | INTRAVENOUS | Status: AC
  Administered 2012-12-14: 240 mL via INTRAVENOUS
  Filled 2012-12-14: qty 250

## 2012-12-14 MED ORDER — VITAL AF 1.2 CAL PO LIQD
1000.0000 mL | ORAL | Status: DC
  Administered 2012-12-14: 1000 mL
  Filled 2012-12-14 (×4): qty 1000

## 2012-12-14 NOTE — Progress Notes (Addendum)
PARENTERAL NUTRITION CONSULT NOTE and ANTICOAGULATION CONSULT NOTE - Follow-Up  Pharmacy Consult: TPN and Coumadin Indication: Ileus and h/o PE  Allergies  Allergen Reactions  . Avelox [Moxifloxacin Hcl In Nacl] Nausea And Vomiting  . Betadine [Povidone Iodine] Itching and Rash  . Alendronate Sodium Nausea And Vomiting  . Aspirin Nausea Only  . Codeine Nausea And Vomiting  . Doxycycline     Pt doesn't remember reaction  . Fluconazole     Pt doesn't remember reaction  . Hydrocodone Nausea And Vomiting    GI distress  . Neurontin [Gabapentin] Other (See Comments)    Mood changes   . Nsaids Other (See Comments)    Severe gastritis & perforation - avoid NSAIDs when possible  . Sertraline Hcl Other (See Comments)    Hallucinations   . Sulfa Antibiotics Rash   Patient Measurements: Height: 5' 4.96" (165 cm) Weight: 93 lb 14.7 oz (42.6 kg) IBW/kg (Calculated) : 56.91  Vital Signs: Temp: 97.7 F (36.5 C) (08/24 0600) Temp src: Oral (08/24 0600) BP: 111/72 mmHg (08/24 0600) Pulse Rate: 104 (08/24 0600) Intake/Output from previous day: 08/23 0701 - 08/24 0700 In: 3037.7 [P.O.:270; I.V.:710; NG/GT:80; TPN:1857.7] Out: 1630 [Urine:1100; Drains:530] Intake/Output from this shift: Total I/O In: 0  Out: 1100 [Urine:1100] Labs:  Recent Labs  12/12/12 0619 12/13/12 1305 12/14/12 0510  INR 1.27 1.20 1.28    Recent Labs  12/12/12 0619 12/14/12 0510  NA 136 133*  K 4.6 4.9  CL 101 96  CO2 27 30  GLUCOSE 93 111*  BUN 9 16  CREATININE 0.49* 0.49*  CALCIUM 9.0 9.6  MG 1.6 1.7  PHOS 3.7  --    Estimated Creatinine Clearance: 47.1 ml/min (by C-G formula based on Cr of 0.49).   Insulin Requirements in the past 24 hours:  None - CBG's are well controlled and all < 150 the last 24 hours.  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  She was admitted with abd pain. s/p exp lap 7/22 with LOA, SBR, wound vac and J-tube. Pt on TPN earlier this  admit 7/22-7/27 post-op while waiting for J-tube to be ready to be used. Pt on J-tube feedings 7/27-8/17. Pt now with ileus so TF on hold and TPN restarted.  GI: hx GERD. TPN for ileus. +BM - last charted 8/22. Distention better per MD note.   On PPI IV. Prealb 4.9 (low but relatively stable over past 3 wks). Wt down to 84 lb (baseline wt 95 lb 7/21).   Has gastrostomy/enterostomy Jejunostomy RUQ 8/22 under Fluro and ok to use -  Now tolerating advancement of TF today.  Endo: no hx - CBGs controlled earlier this admit (both on and off TPN). am glucose <150  Lytes: No labs this morning - hypokalemia/hypomagnesemia with supplementation as needed.    Renal: SCr stable, CrCL ~40 ml/min. MIVF: NS with KCl at 60 ml/hr.   Pulm: hx COPD. RA  Cards: no hx - VSS  Hepatobil: LFTs wnl. TG 98.  Neuro: Hx fibromyalgia, depression - prozac, fent patch  AC: Coumadin for h/o PE, no bleeding. INR not drawn yet this AM - will check later for INR and dose accordingly.  Dose held 8/19 for supratherapeutic level. INR very hard to manage in this pt - probably due to pt on/off TF so varied intake of vit K. H/H remain stable. Plt high. No bleeding noted.  ID: Ertapenem restarted 8/19 for enteritis. Wbc down to nl. Afeb.  Zosyn 7/21 >> 7/31 Ertapenem 7/31>>8/9; 8/19>> Micafungin 8/2 >> 8/11  7/31 peritoneal fluid>> few candida albicans  Best Practices: coumadin  TPN Access: PICC line (triple lumen) 7/23>>  Now with complaints of tenderness but no s/s of infection or erythema noted per RN.   TPN day#: 5 (restart 8/19; off since 7/27)  Current Nutrition:  Clinimix E 5/15 at 68ml/hr + lipids at 74ml/hr provides 1162 kcals and 48 grams protein per day (Goal: Clinimix E 5/15 at 107ml/hr + lipids at 47ml/hr to provide 1672 kCal/d and 84gm of protein per day).  Nutritional Goals:  Kcal: 1500-1700  Protein: 80-95 gm   Plan:  - Will decrease Clinimix E 5/15 to rate of 64ml/hr + lipids at 51ml/hr and  plan to discontinue after this bag.  - Provide MVI, TE, and vit C to TPN - F/u BMET, Mg in AM - Will give Warfarin 6 mg x 1 today and f/u am labs  Nadara Mustard, PharmD., MS Clinical Pharmacist Pager:  [phone] Thank you for allowing pharmacy to be part of this patients care team. 12/14/2012, 11:29 AM

## 2012-12-14 NOTE — Progress Notes (Signed)
33 Days Post-Op  Subjective: Tolerating advance of tube feeds with no increased discomfort  Objective: Vital signs in last 24 hours: Temp:  [97.7 F (36.5 C)-98.3 F (36.8 C)] 97.7 F (36.5 C) (08/24 0600) Pulse Rate:  [97-104] 104 (08/24 0600) Resp:  [16-18] 18 (08/24 0600) BP: (105-120)/(71-72) 111/72 mmHg (08/24 0600) SpO2:  [99 %-100 %] 100 % (08/24 0600) Weight:  [93 lb 14.7 oz (42.6 kg)-95 lb 8 oz (43.319 kg)] 93 lb 14.7 oz (42.6 kg) (08/24 0600) Last BM Date: 12/12/12  Intake/Output from previous day: 08/23 0701 - 08/24 0700 In: 3037.7 [P.O.:270; I.V.:710; NG/GT:80; TPN:1857.7] Out: 1630 [Urine:1100; Drains:530] Intake/Output this shift:    Abdomen soft, wound and drain stable   Lab Results:  No results found for this basename: WBC, HGB, HCT, PLT,  in the last 72 hours BMET  Recent Labs  12/12/12 0619 12/14/12 0510  NA 136 133*  K 4.6 4.9  CL 101 96  CO2 27 30  GLUCOSE 93 111*  BUN 9 16  CREATININE 0.49* 0.49*  CALCIUM 9.0 9.6   PT/INR  Recent Labs  12/13/12 1305 12/14/12 0510  LABPROT 14.9 15.7*  INR 1.20 1.28   ABG No results found for this basename: PHART, PCO2, PO2, HCO3,  in the last 72 hours  Studies/Results: Ir Replc Duoden/jejuno Tube Percut W/fluoro  12/12/2012   *RADIOLOGY REPORT*  Clinical data:  Recurrent dislodgement of surgically placed jejunostomy catheter.  REPLACEMENT OF JEJUNOSTOMY CATHETER UNDER FLUOROSCOPY  Technique and findings: The procedure, risks (including but not limited to bleeding, infection, organ damage), benefits, and alternatives were explained to the patient.  Questions regarding the procedure were encouraged and answered.  The patient understands and consents to the procedure.The site of the previous jejunostomy catheter in the left mid abdomen was prepped with Betadine, draped in usual sterile fashion.  Viscous lidocaine was liberally applied around the site.  Under fluoroscopic guidance, Bowen 5-French catheter was  advanced partially into the tract.  Contrast injection opacified the tract to the small bowel.  An angled glide wire advanced into the lumen of the small bowel.  Over the guide wire, the tract was dilated to facilitate advancement of an 73- French balloon retention single-lumen jejunostomy catheter. Contrast injection confirmed appropriate patency and intraluminal position.  The retention balloon was inflated with  7 ml sterile saline.  The catheter was flushed and capped. The patient tolerated the procedure well.  No immediate complication.  IMPRESSION: Technically successful replacement of jejunostomy catheter with Bowen 18-French balloon retention device, okay for routine use.   Original Report Authenticated By: D. Andria Rhein, MD    Anti-infectives: Anti-infectives   Start     Dose/Rate Route Frequency Ordered Stop   12/09/12 1000  ertapenem (INVANZ) 1 g in sodium chloride 0.9 % 50 mL IVPB     1 g 100 mL/hr over 30 Minutes Intravenous Every 24 hours 12/09/12 0852     11/22/12 2200  micafungin (MYCAMINE) 100 mg in sodium chloride 0.9 % 100 mL IVPB     100 mg 100 mL/hr over 1 Hours Intravenous Every 24 hours 11/22/12 2103 12/01/12 2215   11/20/12 0730  ertapenem (INVANZ) 1 g in sodium chloride 0.9 % 50 mL IVPB     1 g 100 mL/hr over 30 Minutes Intravenous Every 24 hours 11/20/12 0709 11/29/12 0653   11/10/12 1000  piperacillin-tazobactam (ZOSYN) IVPB 3.375 g  Status:  Discontinued     3.375 g 12.5 mL/hr over 240 Minutes Intravenous 3  times per day 11/10/12 0937 11/20/12 0709      Assessment/Plan: s/p Procedure(s): EXPLORATORY LAPAROTOMY (N/Bowen) SMALL BOWEL RESECTION (N/Bowen) APPLICATION OF WOUND VAC (N/Bowen) PLACEMENT OF FEEDING JEJUNOSTOMY TUBE (N/Bowen) LYSIS OF ADHESION (N/Bowen)  Advance TF's to 30  LOS: 35 days    Cynthia Bowen 12/14/2012

## 2012-12-15 LAB — PROTIME-INR: Prothrombin Time: 17.2 seconds — ABNORMAL HIGH (ref 11.6–15.2)

## 2012-12-15 LAB — CBC
HCT: 31.2 % — ABNORMAL LOW (ref 36.0–46.0)
MCHC: 33.7 g/dL (ref 30.0–36.0)
RDW: 20.7 % — ABNORMAL HIGH (ref 11.5–15.5)

## 2012-12-15 LAB — PREALBUMIN: Prealbumin: 11.4 mg/dL — ABNORMAL LOW (ref 17.0–34.0)

## 2012-12-15 MED ORDER — TRACE MINERALS CR-CU-F-FE-I-MN-MO-SE-ZN IV SOLN
INTRAVENOUS | Status: DC
  Administered 2012-12-15: 17:00:00 via INTRAVENOUS
  Filled 2012-12-15: qty 1000

## 2012-12-15 MED ORDER — WARFARIN SODIUM 6 MG PO TABS
6.0000 mg | ORAL_TABLET | Freq: Once | ORAL | Status: AC
  Administered 2012-12-15: 6 mg via ORAL
  Filled 2012-12-15: qty 1

## 2012-12-15 MED ORDER — VITAL AF 1.2 CAL PO LIQD
1000.0000 mL | ORAL | Status: DC
  Administered 2012-12-15 – 2012-12-18 (×4): 1000 mL
  Filled 2012-12-15 (×5): qty 1000

## 2012-12-15 MED ORDER — FAT EMULSION 20 % IV EMUL
240.0000 mL | INTRAVENOUS | Status: DC
  Administered 2012-12-15: 240 mL via INTRAVENOUS
  Filled 2012-12-15: qty 250

## 2012-12-15 MED ORDER — VITAL AF 1.2 CAL PO LIQD
1000.0000 mL | ORAL | Status: DC
  Administered 2012-12-15: 1000 mL
  Filled 2012-12-15 (×2): qty 1000

## 2012-12-15 NOTE — Progress Notes (Signed)
NUTRITION FOLLOW UP  DOCUMENTATION CODES Per approved criteria  -Severe malnutrition in the context of chronic illness -Underweight   Intervention:   1.  Enteral nutrition; Continue with advancements per MD discretion.  Pt planning to advance to 55 mL/hr in ~12 hrs which is goal rate 2. Parenteral nutrition; continue wean per PharmD.  3.  Free water;  Recommend 80 mL free water 6 times daily either with meds or as flushes.   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  Not likely to resolve this admission, now TF-dependent  Goal:   TPN to meet > 90% of estimated nutrition needs, no longer appropriate. Discontinued New goal:  Enteral nutrition to meet >/=90% estimated needs with tolerance.  Monitor:   TPN prescription, weight, labs, I/O's  Assessment:   Patient with hx of chronic gastrocutaneous fistula and malnutrition; on TPN at home; presented to ED with lower abdominal pain; abdominal X-ray concerning for partial small bowel obstruction.  S/P exploratory laparotomy with lysis of adhesions, small bowel resection, and application of wound VAC 7/22. CT aspiration of abdominal fluid collection 7/31.  J-tube replaced and pt has been receiving Vital 1.2 since Friday night.  She is currently at 40 mL/hr which provides 1152 kcal, 72g protein, and 778 mL free water.  Goal rate of 55 mL/hr will provide 1584 kcal, 99g protein, and 1070 mL free water.  Pt denies abdominal pain.  Reports a little nausea overnight, but still tolerating ice chips.   Patient is receiving TPN with Clinimix E 5/15 @ 35 ml/hr and lipids @ 10 ml/hr which provides 1076 kcals and 42 grams protein per day.  She has initiated weaning from 70 mL/hr.   Height: Ht Readings from Last 1 Encounters:  11/10/12 5' 4.96" (1.65 m)    Weight Status:   Wt Readings from Last 1 Encounters:  12/14/12 93 lb 14.7 oz (42.6 kg)    Body mass index is 15.65 kg/(m^2).  Re-estimated  needs:  Kcal: 1500-1700 Protein: 80-95 gm Fluid: 1.5-1.7 L  Skin: bilateral abdominal incision   Diet Order: NPO   Intake/Output Summary (Last 24 hours) at 12/15/12 1243 Last data filed at 12/15/12 0809  Gross per 24 hour  Intake 1881.33 ml  Output    700 ml  Net 1181.33 ml    Labs:   Recent Labs Lab 12/10/12 0720 12/11/12 0500 12/12/12 0619 12/14/12 0510  NA 138 137 136 133*  K 3.1* 3.4* 4.6 4.9  CL 103 100 101 96  CO2 26 29 27 30   BUN 3* 5* 9 16  CREATININE 0.46* 0.46* 0.49* 0.49*  CALCIUM 8.3* 8.4 9.0 9.6  MG 1.6 2.0 1.6 1.7  PHOS 2.0* 3.3 3.7  --   GLUCOSE 144* 101* 93 111*    Scheduled Meds: . ertapenem  1 g Intravenous Q24H  . feeding supplement (VITAL AF 1.2 CAL)  1,000 mL Per Tube Q24H  . fentaNYL  100 mcg Transdermal Q72H  . FLUoxetine  20 mg Per Tube Daily  . lidocaine  15 mL Intradermal Once  . metoCLOPramide (REGLAN) injection  10 mg Intravenous Q6H  . pantoprazole (PROTONIX) IV  40 mg Intravenous QHS  . sodium chloride  10-40 mL Intracatheter Q12H  . warfarin  6 mg Oral ONCE-1800  . warfarin   Does not apply Once  . Warfarin - Pharmacist Dosing Inpatient   Does not apply q1800    Continuous Infusions: . 0.9 % NaCl with KCl 40 mEq /  L 40 mL/hr at 12/14/12 1140  . Marland KitchenTPN (CLINIMIX-E) Adult 35 mL/hr at 12/14/12 1841   And  . fat emulsion 240 mL (12/14/12 1841)  . Marland KitchenTPN (CLINIMIX-E) Adult     And  . fat emulsion      Loyce Dys, MS RD LDN Clinical Inpatient Dietitian Pager: [phone] Weekend/After hours pager: [phone]

## 2012-12-15 NOTE — Progress Notes (Signed)
34 Days Post-Op  Subjective: Patient feeling fairly well today. No increase in abdominal pain or distention with advance in tube feeds Skin feels better Still with a fair amount of drain output, but virtually no drainage from midline wound  Objective: Vital signs in last 24 hours: Temp:  [98.2 F (36.8 C)-98.7 F (37.1 C)] 98.7 F (37.1 C) (08/25 0635) Pulse Rate:  [89-105] 89 (08/25 0635) Resp:  [17-18] 17 (08/25 0635) BP: (100-110)/(63-70) 109/65 mmHg (08/25 0635) SpO2:  [97 %-100 %] 98 % (08/25 0635) Last BM Date: 12/13/12  Intake/Output from previous day: 08/24 0701 - 08/25 0700 In: 1931.3 [I.V.:1001.3; NG/GT:240; IV Piggyback:50; TPN:640] Out: 1900 [Urine:1600; Drains:300] Intake/Output this shift:    General appearance: alert, cooperative and no distress Resp: clear to auscultation bilaterally Cardio: regular rate and rhythm, S1, S2 normal, no murmur, click, rub or gallop GI: skin around drain with barrier cream - no erythema noted Midline wound - granulating very well  Lab Results:  No results found for this basename: WBC, HGB, HCT, PLT,  in the last 72 hours BMET  Recent Labs  12/14/12 0510  NA 133*  K 4.9  CL 96  CO2 30  GLUCOSE 111*  BUN 16  CREATININE 0.49*  CALCIUM 9.6   PT/INR  Recent Labs  12/13/12 1305 12/14/12 0510  LABPROT 14.9 15.7*  INR 1.20 1.28   ABG No results found for this basename: PHART, PCO2, PO2, HCO3,  in the last 72 hours  Studies/Results: No results found.  Anti-infectives: Anti-infectives   Start     Dose/Rate Route Frequency Ordered Stop   12/09/12 1000  ertapenem (INVANZ) 1 g in sodium chloride 0.9 % 50 mL IVPB     1 g 100 mL/hr over 30 Minutes Intravenous Every 24 hours 12/09/12 0852     11/22/12 2200  micafungin (MYCAMINE) 100 mg in sodium chloride 0.9 % 100 mL IVPB     100 mg 100 mL/hr over 1 Hours Intravenous Every 24 hours 11/22/12 2103 12/01/12 2215   11/20/12 0730  ertapenem (INVANZ) 1 g in sodium  chloride 0.9 % 50 mL IVPB     1 g 100 mL/hr over 30 Minutes Intravenous Every 24 hours 11/20/12 0709 11/29/12 0653   11/10/12 1000  piperacillin-tazobactam (ZOSYN) IVPB 3.375 g  Status:  Discontinued     3.375 g 12.5 mL/hr over 240 Minutes Intravenous 3 times per day 11/10/12 0937 11/20/12 0709      Assessment/Plan: s/p Procedure(s): EXPLORATORY LAPAROTOMY (N/A) SMALL BOWEL RESECTION (N/A) APPLICATION OF WOUND VAC (N/A) PLACEMENT OF FEEDING JEJUNOSTOMY TUBE (N/A) LYSIS OF ADHESION (N/A) Advance tube feeds to 40 ml/hr, then 55 ml/hr in twelve hours if able to tolerate Check CBC, Prealbumin today Will hopefully be able to wean off TNA and transition to abx via j-tube soon, in preparation for discharge later in the week.   LOS: 36 days    Cynthia Bowen K. 12/15/2012

## 2012-12-15 NOTE — Progress Notes (Signed)
Physical Therapy Treatment Patient Details Name: Cynthia Bowen MRN: [id number] DOB: 1946/02/26 Today's Date: 12/15/2012 Time: 2536-6440 PT Time Calculation (min): 25 min  PT Assessment / Plan / Recommendation  History of Present Illness 67 year old female with PMH of COPD and multiple abdominal surgeries who presents to the ICU post abdominal surgery due to 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     PT Comments   Pt making steady progress with mobility. Pt is minimally unsteady with gt; requiring supervision. Pt prefers to "furniture walk" at home. Discussed benefits of using SPC or RW to increase stability and decrease risk of falls; pt refuses. Will cont to f/u with pt while in acute setting to maximize functional mobility.   Follow Up Recommendations  Home health PT;Supervision - Intermittent     Does the patient have the potential to tolerate intense rehabilitation     Barriers to Discharge        Equipment Recommendations  None recommended by PT    Recommendations for Other Services    Frequency Min 3X/week   Progress towards PT Goals Progress towards PT goals: Progressing toward goals  Plan Current plan remains appropriate    Precautions / Restrictions Precautions Precautions: Fall Restrictions Weight Bearing Restrictions: No   Pertinent Vitals/Pain 8/10 in Rt upper quadrant of abdominal; RN notified     Mobility  Bed Mobility Bed Mobility: Left Sidelying to Sit Left Sidelying to Sit: 7: Independent Details for Bed Mobility Assistance: no physical (A) needed; pt with safe technique  Transfers Transfers: Sit to Stand;Stand to Sit Sit to Stand: 5: Supervision;From bed Stand to Sit: 6: Modified independent (Device/Increase time);To toilet;To chair/3-in-1;With armrests Details for Transfer Assistance: supervision for safety and lines; pt required use of handicap railing to sit to standard toilet seat; min cues for  safety and hand placement; pt demo decr safety awareness; can be impulsive with transfers Ambulation/Gait Ambulation/Gait Assistance: 5: Supervision Ambulation Distance (Feet): 510 Feet Assistive device: None Ambulation/Gait Assistance Details: pt amb with short shuffled gt; tends to grab onto furninture, handrails for (A) to steady; pt denies need for [facility] or other AD to increase stability. No LOB noted but pt requires increased time and supervision for safety with dynamic gt changes Gait Pattern: Step-through pattern;Decreased stride length;Shuffle;Narrow base of support Gait velocity: decreased Stairs: No    Exercises General Exercises - Lower Extremity Ankle Circles/Pumps: AROM;Both;10 reps Long Arc Quad: AROM;Strengthening;Both;10 reps Hip ABduction/ADduction: AROM;Strengthening;Both;10 reps Hip Flexion/Marching: AROM;Strengthening;Both;10 reps;Seated Other Exercises Other Exercises: reviewed HEP handout; given instructions and cues for frequency and duration    PT Diagnosis:    PT Problem List:   PT Treatment Interventions:     PT Goals (current goals can now be found in the care plan section) Acute Rehab PT Goals Patient Stated Goal: to not have pain and get out of here PT Goal Formulation: With patient Time For Goal Achievement: 12/17/12 Potential to Achieve Goals: Good  Visit Information  Last PT Received On: 12/15/12 Assistance Needed: +1 History of Present Illness: 67 year old female with PMH of COPD and multiple abdominal surgeries who presents to the ICU post abdominal surgery due to 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      Subjective Data  Subjective: "i dont really want to do anything, i guess i will. im hurting" Patient Stated Goal: to not have pain and get out of here   Cognition  Cognition Arousal/Alertness: Awake/alert Behavior  During Therapy: WFL for tasks assessed/performed Overall Cognitive Status:  Within Functional Limits for tasks assessed    Balance  Balance Balance Assessed: No  End of Session PT - End of Session Activity Tolerance: Patient tolerated treatment well Patient left: in chair;with call bell/phone within reach Nurse Communication: Patient requests pain meds;Mobility status   GP     Donell Sievert, [place] [phone] 12/15/2012, 9:35 AM

## 2012-12-15 NOTE — Progress Notes (Addendum)
PARENTERAL NUTRITION CONSULT NOTE and ANTICOAGULATION CONSULT NOTE - Follow-Up  Pharmacy Consult: TPN and Coumadin Indication: Ileus and h/o PE  Allergies  Allergen Reactions  . Avelox [Moxifloxacin Hcl In Nacl] Nausea And Vomiting  . Betadine [Povidone Iodine] Itching and Rash  . Alendronate Sodium Nausea And Vomiting  . Aspirin Nausea Only  . Codeine Nausea And Vomiting  . Doxycycline     Pt doesn't remember reaction  . Fluconazole     Pt doesn't remember reaction  . Hydrocodone Nausea And Vomiting    GI distress  . Neurontin [Gabapentin] Other (See Comments)    Mood changes   . Nsaids Other (See Comments)    Severe gastritis & perforation - avoid NSAIDs when possible  . Sertraline Hcl Other (See Comments)    Hallucinations   . Sulfa Antibiotics Rash   Patient Measurements: Height: 5' 4.96" (165 cm) Weight: 93 lb 14.7 oz (42.6 kg) IBW/kg (Calculated) : 56.91  Vital Signs: Temp: 98.7 F (37.1 C) (08/25 0635) BP: 109/65 mmHg (08/25 0635) Pulse Rate: 89 (08/25 0635) Intake/Output from previous day: 08/24 0701 - 08/25 0700 In: 1931.3 [I.V.:1001.3; NG/GT:240; IV Piggyback:50; TPN:640] Out: 1900 [Urine:1600; Drains:300] Intake/Output from this shift: Total I/O In: -  Out: 50 [Drains:50] Labs:  Recent Labs  12/13/12 1305 12/14/12 0510 12/15/12 1155  WBC  --   --  12.6*  HGB  --   --  10.5*  HCT  --   --  31.2*  PLT  --   --  471*  INR 1.20 1.28 1.44    Recent Labs  12/14/12 0510  NA 133*  K 4.9  CL 96  CO2 30  GLUCOSE 111*  BUN 16  CREATININE 0.49*  CALCIUM 9.6  MG 1.7   Estimated Creatinine Clearance: 47.1 ml/min (by C-G formula based on Cr of 0.49).   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  She was admitted with abd pain. s/p exp lap 7/22 with LOA, SBR, wound vac and J-tube. Pt on TPN earlier this admit 7/22-7/27 post-op while waiting for J-tube to be ready  to be used. Pt on J-tube feedings 7/27-8/17. Pt now with ileus so TF on hold and TPN restarted.  GI: hx GERD. TPN for ileus. +BM - last charted 8/22. Distention better per MD note.   On PPI IV. Prealb 4.9 (low but relatively stable over past 3 wks). Wt down to 84 lb (baseline wt 95 lb 7/21).   Has gastrostomy/enterostomy Jejunostomy RUQ 8/22 under Fluro and ok to use -  Now tolerating advancement of TF today.  Endo: no hx - CBGs controlled earlier this admit (both on and off TPN). am glucose <150  Lytes: No labs this morning - hypokalemia/hypomagnesemia with supplementation as needed.    Renal: SCr stable, CrCL ~40 ml/min. MIVF: NS with KCl at 60 ml/hr.   Pulm: hx COPD. RA  Cards: no hx - VSS  Hepatobil: LFTs wnl. TG 98.  Neuro: Hx fibromyalgia, depression - prozac, fent patch  AC: Coumadin for h/o PE, no bleeding. INR  1.44 Dose held 8/19 for supratherapeutic level. No bleeding noted.  ID: Ertapenem restarted 8/19 for enteritis. Wbc 12.6. Afeb.  Zosyn 7/21 >> 7/31 Ertapenem 7/31>>8/9; 8/19>> Micafungin 8/2 >> 8/11  7/31 peritoneal fluid>> few candida albicans  Best Practices: coumadin  TPN Access: PICC line (triple lumen) 7/23>>  Now with complaints of tenderness  but no s/s of infection or erythema noted per RN.   TPN day#: 6 (restart 8/19; off since 7/27)  Current Nutrition:  Clinimix E 5/15 at 72ml/hr + lipids at 37ml/hr provides 1162 kcals and 48 grams protein per day (Goal: Clinimix E 5/15 at 39ml/hr + lipids at 80ml/hr to provide 1672 kCal/d and 84gm of protein per day).  Nutritional Goals:  Kcal: 1500-1700  Protein: 80-95 gm   Plan:  - Cont Clinimix E 5/15 at 28ml/hr + lipids at 39ml/hr - Provide MVI and TE to TPN - Will give Warfarin 6 mg x 1 today and f/u am labs  Talbert Cage, PharmD Clinical Pharmacist Pager:  [phone] Thank you for allowing pharmacy to be part of this patients care team. 12/15/2012, 12:33 PM

## 2012-12-16 ENCOUNTER — Telehealth: Payer: Self-pay | Admitting: Radiology

## 2012-12-16 MED ORDER — AMOXICILLIN-POT CLAVULANATE 250-62.5 MG/5ML PO SUSR
500.0000 mg | Freq: Two times a day (BID) | ORAL | Status: DC
  Administered 2012-12-16 – 2012-12-19 (×5): 500 mg
  Filled 2012-12-16 (×8): qty 10

## 2012-12-16 MED ORDER — WARFARIN SODIUM 7.5 MG PO TABS
7.5000 mg | ORAL_TABLET | Freq: Once | ORAL | Status: AC
  Administered 2012-12-16: 7.5 mg via ORAL
  Filled 2012-12-16: qty 1

## 2012-12-16 NOTE — Progress Notes (Signed)
PARENTERAL NUTRITION CONSULT NOTE and ANTICOAGULATION CONSULT NOTE - Follow-Up  Pharmacy Consult: TPN and Coumadin, Augmentin Indication: Ileus and h/o PE  Allergies  Allergen Reactions  . Avelox [Moxifloxacin Hcl In Nacl] Nausea And Vomiting  . Betadine [Povidone Iodine] Itching and Rash  . Alendronate Sodium Nausea And Vomiting  . Aspirin Nausea Only  . Codeine Nausea And Vomiting  . Doxycycline     Pt doesn't remember reaction  . Fluconazole     Pt doesn't remember reaction  . Hydrocodone Nausea And Vomiting    GI distress  . Neurontin [Gabapentin] Other (See Comments)    Mood changes   . Nsaids Other (See Comments)    Severe gastritis & perforation - avoid NSAIDs when possible  . Sertraline Hcl Other (See Comments)    Hallucinations   . Sulfa Antibiotics Rash   Patient Measurements: Height: 5' 4.96" (165 cm) Weight: 93 lb 14.7 oz (42.6 kg) IBW/kg (Calculated) : 56.91  Vital Signs: Temp: 98 F (36.7 C) (08/26 0550) Temp src: Oral (08/26 0550) BP: 110/54 mmHg (08/26 0550) Pulse Rate: 105 (08/26 0550) Intake/Output from previous day: 08/25 0701 - 08/26 0700 In: 10 [I.V.:10] Out: 240 [Drains:240] Intake/Output from this shift: Total I/O In: 0  Out: 401 [Urine:400; Stool:1] Labs:  Recent Labs  12/14/12 0510 12/15/12 1155 12/16/12 0500  WBC  --  12.6*  --   HGB  --  10.5*  --   HCT  --  31.2*  --   PLT  --  471*  --   INR 1.28 1.44 1.44    Recent Labs  12/14/12 0510 12/15/12 1155  NA 133*  --   K 4.9  --   CL 96  --   CO2 30  --   GLUCOSE 111*  --   BUN 16  --   CREATININE 0.49*  --   CALCIUM 9.6  --   MG 1.7  --   PREALBUMIN  --  11.4*   Estimated Creatinine Clearance: 47.1 ml/min (by C-G formula based on Cr of 0.49).   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  She was admitted with abd pain. s/p exp lap 7/22 with LOA, SBR, wound vac and J-tube. Pt on TPN  earlier this admit 7/22-7/27 post-op while waiting for J-tube to be ready to be used. Pt on J-tube feedings 7/27-8/17. Pt now with ileus so TF on hold and TPN restarted.  TF has now advsnced to [place] and TPN will be dced.  GI: hx GERD. TPN for ileus. +BM - last charted 8/22. Distention better per MD note.   On PPI IV. Prealb 4.9 (low but relatively stable over past 3 wks). Wt down to 84 lb (baseline wt 95 lb 7/21).   Has gastrostomy/enterostomy Jejunostomy RUQ 8/22 under Fluro and ok to use -  Now tolerating advancement of TF   Endo: no hx - CBGs controlled earlier this admit (both on and off TPN). am glucose <150  Lytes: No labs this morning - hypokalemia/hypomagnesemia with supplementation as needed.    Renal: SCr stable, CrCL ~40 ml/min. MIVF: NS with KCl at 60 ml/hr.   Pulm: hx COPD. RA  Cards: no hx - VSS  Hepatobil: LFTs wnl. TG 98.  Neuro: Hx fibromyalgia, depression - prozac, fent patch  AC: Coumadin for h/o PE, no bleeding. INR  1.44, No change from yesterdayDose held 8/19 for  supratherapeutic level. No bleeding noted.  ID: Ertapenem restarted 8/19 for enteritis. MD wishes to change to per tube Augmentin. Afeb  Zosyn 7/21 >> 7/31 Ertapenem 7/31>>8/9; 8/19>>8/26 Micafungin 8/2 >> 8/11 Augmentin  8/26>>  7/31 peritoneal fluid>> few candida albicans  Best Practices: coumadin  Plan:  - Discontinue TPN today and TPN labs - Will give Warfarin 7.5 mg x 1 today  -Augmentin 500 mg per tube q12 hours  Talbert Cage, PharmD Clinical Pharmacist Pager:  [phone] Thank you for allowing pharmacy to be part of this patients care team. 12/16/2012, 9:31 AM

## 2012-12-16 NOTE — Progress Notes (Signed)
Patient evaluated for community based chronic disease management services with [facility] Care Management Program as a benefit of patient's [facility] Medicare Insurance.  Patient has declined [facility] Care Management services 4 x in the last 6 months.  Reached out to her PCP, Dr Merla Riches, ([facility] [phone]) to request assistance with patient engagement.  Dr Merla Riches has agreed to speak to the patient/family as a [facility] advocate.  He will then collaborate with Dr [name] to support this plan.  Will continue to monitor.  Of note, [facility] Care Management services does not replace or interfere with any services that are arranged by inpatient case management or social work.  For additional questions or referrals please contact Anibal Henderson BSN RN [facility] Liaison at [phone].

## 2012-12-16 NOTE — Progress Notes (Signed)
Physical Therapy Treatment Patient Details Name: AGRIPINA GUYETTE MRN: [id number] DOB: Nov 14, 1945 Today's Date: 12/16/2012 Time: 6962-9528 PT Time Calculation (min): 12 min  PT Assessment / Plan / Recommendation  History of Present Illness 67 year old female with PMH of COPD and multiple abdominal surgeries who presents to the ICU post abdominal surgery due to 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     PT Comments   Pt ambulated again in hallway and continues to require supervision for safety.  Follow Up Recommendations  Home health PT;Supervision - Intermittent     Does the patient have the potential to tolerate intense rehabilitation     Barriers to Discharge        Equipment Recommendations  None recommended by PT    Recommendations for Other Services    Frequency     Progress towards PT Goals Progress towards PT goals: Progressing toward goals  Plan Current plan remains appropriate    Precautions / Restrictions Precautions Precautions: Fall Precaution Comments: jp drain, peg tube Restrictions Weight Bearing Restrictions: No   Pertinent Vitals/Pain 8/10 abdominal pain, RN notified and brought meds end of session    Mobility  Bed Mobility Bed Mobility: Sit to Supine Sit to Supine: 7: Independent Transfers Transfers: Sit to Stand;Stand to Sit Sit to Stand: 5: Supervision;From chair/3-in-1 Stand to Sit: 5: Supervision;To bed Details for Transfer Assistance: supervision for safety and lines, pt closed end of drain slightly in recliner leg rest upon standing so assisted with lifting recliner legs Ambulation/Gait Ambulation/Gait Assistance: 5: Supervision Ambulation Distance (Feet): 500 Feet Assistive device: None Ambulation/Gait Assistance Details: continues to demonstrate short shuffled gait, with tendency to increase pace with head leading so verbal cues to slow pace for safety, kept arms wrapped around abdomen likely  due to pain Gait Pattern: Step-through pattern;Decreased stride length;Shuffle;Narrow base of support    Exercises     PT Diagnosis:    PT Problem List:   PT Treatment Interventions:     PT Goals (current goals can now be found in the care plan section)    Visit Information  Last PT Received On: 12/16/12 Assistance Needed: +1 History of Present Illness: 67 year old female with PMH of COPD and multiple abdominal surgeries who presents to the ICU post abdominal surgery due to 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      Subjective Data      Cognition  Cognition Arousal/Alertness: Awake/alert Behavior During Therapy: WFL for tasks assessed/performed Overall Cognitive Status: Within Functional Limits for tasks assessed    Balance     End of Session PT - End of Session Activity Tolerance: Patient tolerated treatment well Patient left: with call bell/phone within reach;with nursing/sitter in room;in bed Nurse Communication: Patient requests pain meds   GP     Jeury Mcnab,KATHrine E 12/16/2012, 11:29 AM Zenovia Jarred, PT, DPT 12/16/2012 Pager: [phone]

## 2012-12-16 NOTE — Progress Notes (Signed)
Spoke with Jackey Loge RN [facility] Liaison [phone]) to request service program engagement.  The patient is currently active with their telephonic Transition of Care support program.  Of note, [facility] Care Management services does not replace or interfere with any services that are arranged by inpatient case management or social work.  For additional questions or referrals please contact Anibal Henderson BSN RN [facility] Liaison at [phone].

## 2012-12-16 NOTE — Progress Notes (Signed)
35 Days Post-Op  Subjective: Patient tolerating tube feeds at goal rate - no abd pain/ distention + BM Drain output 240 cc greenish fluid  Objective: Vital signs in last 24 hours: Temp:  [98 F (36.7 C)-98.4 F (36.9 C)] 98 F (36.7 C) (08/26 0550) Pulse Rate:  [99-108] 105 (08/26 0550) Resp:  [19] 19 (08/26 0550) BP: (94-110)/(54-64) 110/54 mmHg (08/26 0550) SpO2:  [98 %-100 %] 98 % (08/26 0550) Last BM Date: 12/15/12  Intake/Output from previous day: 08/25 0701 - 08/26 0700 In: 10 [I.V.:10] Out: 240 [Drains:240] Intake/Output this shift: Total I/O In: 0  Out: 401 [Urine:400; Stool:1]  General appearance: alert, cooperative and no distress Resp: clear to auscultation bilaterally Cardio: regular rate and rhythm, S1, S2 normal, no murmur, click, rub or gallop GI: soft; drain with some yellowish output around the site J-tube site looks good Midline wound getting smaller - granulating  Lab Results:   Recent Labs  12/15/12 1155  WBC 12.6*  HGB 10.5*  HCT 31.2*  PLT 471*   BMET  Recent Labs  12/14/12 0510  NA 133*  K 4.9  CL 96  CO2 30  GLUCOSE 111*  BUN 16  CREATININE 0.49*  CALCIUM 9.6   PT/INR  Recent Labs  12/15/12 1155 12/16/12 0500  LABPROT 17.2* 17.2*  INR 1.44 1.44   ABG No results found for this basename: PHART, PCO2, PO2, HCO3,  in the last 72 hours  Prealbumin 11.4  Studies/Results: No results found.  Anti-infectives: Anti-infectives   Start     Dose/Rate Route Frequency Ordered Stop   12/09/12 1000  ertapenem (INVANZ) 1 g in sodium chloride 0.9 % 50 mL IVPB     1 g 100 mL/hr over 30 Minutes Intravenous Every 24 hours 12/09/12 0852     11/22/12 2200  micafungin (MYCAMINE) 100 mg in sodium chloride 0.9 % 100 mL IVPB     100 mg 100 mL/hr over 1 Hours Intravenous Every 24 hours 11/22/12 2103 12/01/12 2215   11/20/12 0730  ertapenem (INVANZ) 1 g in sodium chloride 0.9 % 50 mL IVPB     1 g 100 mL/hr over 30 Minutes Intravenous  Every 24 hours 11/20/12 0709 11/29/12 0653   11/10/12 1000  piperacillin-tazobactam (ZOSYN) IVPB 3.375 g  Status:  Discontinued     3.375 g 12.5 mL/hr over 240 Minutes Intravenous 3 times per day 11/10/12 0937 11/20/12 0709      Assessment/Plan: s/p Procedure(s): EXPLORATORY LAPAROTOMY (N/A) SMALL BOWEL RESECTION (N/A) APPLICATION OF WOUND VAC (N/A) PLACEMENT OF FEEDING JEJUNOSTOMY TUBE (N/A) LYSIS OF ADHESION (N/A) Wean off TNA today Awaiting therapeutic level on Coumadin Change to abx by feeding tube Hopefully home later this week.   LOS: 37 days    Birgit Nowling K. 12/16/2012

## 2012-12-16 NOTE — Telephone Encounter (Signed)
Phone call from hospital, patient has had another admission for small bowel resection. She also has COPD flare, she is COPD gold candidate. Patient is declining the COPD gold treatment, I have transferred the call to Dr Merla Riches. If I need to do anything further with this, please let me know.

## 2012-12-17 ENCOUNTER — Telehealth (INDEPENDENT_AMBULATORY_CARE_PROVIDER_SITE_OTHER): Payer: Self-pay

## 2012-12-17 LAB — PROTIME-INR: Prothrombin Time: 17.6 seconds — ABNORMAL HIGH (ref 11.6–15.2)

## 2012-12-17 MED ORDER — WARFARIN SODIUM 7.5 MG PO TABS
7.5000 mg | ORAL_TABLET | Freq: Once | ORAL | Status: AC
  Administered 2012-12-17: 7.5 mg via ORAL
  Filled 2012-12-17: qty 1

## 2012-12-17 NOTE — Telephone Encounter (Signed)
The pt wanted her sister to call us to let us know the pt's picc line is not working now. The pt states that she is having issues with the medicine going into the picc line. I advised that if the pt is having any issues that she needs to speak to the floor nurse at the hospital to help with the pt.

## 2012-12-17 NOTE — Progress Notes (Signed)
12-17-12 Sarah from  [facility] called back and stated  [facility] would have to "drop patient " due to not having staff .    Explained same to patient , offered list of Home Health Agencies to patient , patient chose [facility] . [facility] accepted referral .   [facility] feedings will now be provided by [facility] , not [facility] . Patient  aware .   Called  Maxcine Ham and Young Berry  at [place]  , both aware [facility] will be providing tube feeding .    [place] [phone]

## 2012-12-17 NOTE — Telephone Encounter (Signed)
Patient called wanting to speak to [facility]. She states she is having issues with her feeding tube and her machine keeps beeping. She does not want to stay in hospital if this keeps happening. I explained to patient she needs get a nurse from the hospital to come in her room and address this issue and that Pattricia Boss cannot help her here from the office. She states the nurses at the hospital cannot keep the machine from beeping. She still demands that [place] call her at [phone]. I explained I will pass this message on to [facility] and Dr [name].

## 2012-12-17 NOTE — Progress Notes (Signed)
36 Days Post-Op  Subjective: Patient complaining only of pain around the right side drain No problems with tube feeds at goal rate Off Invanz - on Augmentin 500 BID Coumadin - INR 1.49  Objective: Vital signs in last 24 hours: Temp:  [98.1 F (36.7 C)-98.7 F (37.1 C)] 98.7 F (37.1 C) (08/27 0554) Pulse Rate:  [94-101] 94 (08/27 0554) Resp:  [18] 18 (08/27 0554) BP: (93-117)/(55-82) 99/55 mmHg (08/27 0554) SpO2:  [92 %-100 %] 93 % (08/27 0554) Last BM Date: 12/15/12  Intake/Output from previous day: 08/26 0701 - 08/27 0700 In: 6109.8 [I.V.:1418.7; ZO/XW:9604.3; TPN:2503.9] Out: 1726 [Urine:1500; Drains:225; Stool:1] Intake/Output this shift:    General appearance: alert, cooperative and no distress Resp: clear to auscultation bilaterally Cardio: regular rate and rhythm, S1, S2 normal, no murmur, click, rub or gallop GI: j-tube - site clean/ dry; RUQ drain - greenish output Wound - granulating well; no fistula output  Lab Results:   Recent Labs  12/15/12 1155  WBC 12.6*  HGB 10.5*  HCT 31.2*  PLT 471*   BMET No results found for this basename: NA, K, CL, CO2, GLUCOSE, BUN, CREATININE, CALCIUM,  in the last 72 hours PT/INR  Recent Labs  12/16/12 0500 12/17/12 0515  LABPROT 17.2* 17.6*  INR 1.44 1.49   ABG No results found for this basename: PHART, PCO2, PO2, HCO3,  in the last 72 hours  Studies/Results: No results found.  Anti-infectives: Anti-infectives   Start     Dose/Rate Route Frequency Ordered Stop   12/16/12 1000  amoxicillin-clavulanate (AUGMENTIN) 250-62.5 MG/5ML suspension 500 mg     500 mg Per Tube Every 12 hours 12/16/12 0941     12/09/12 1000  ertapenem (INVANZ) 1 g in sodium chloride 0.9 % 50 mL IVPB  Status:  Discontinued     1 g 100 mL/hr over 30 Minutes Intravenous Every 24 hours 12/09/12 0852 12/16/12 0927   11/22/12 2200  micafungin (MYCAMINE) 100 mg in sodium chloride 0.9 % 100 mL IVPB     100 mg 100 mL/hr over 1 Hours  Intravenous Every 24 hours 11/22/12 2103 12/01/12 2215   11/20/12 0730  ertapenem (INVANZ) 1 g in sodium chloride 0.9 % 50 mL IVPB     1 g 100 mL/hr over 30 Minutes Intravenous Every 24 hours 11/20/12 0709 11/29/12 0653   11/10/12 1000  piperacillin-tazobactam (ZOSYN) IVPB 3.375 g  Status:  Discontinued     3.375 g 12.5 mL/hr over 240 Minutes Intravenous 3 times per day 11/10/12 0937 11/20/12 0709      Assessment/Plan: s/p Procedure(s): EXPLORATORY LAPAROTOMY (N/A) SMALL BOWEL RESECTION (N/A) APPLICATION OF WOUND VAC (N/A) PLACEMENT OF FEEDING JEJUNOSTOMY TUBE (N/A) LYSIS OF ADHESION (N/A) Off TNA - at goal rate on tube feeds On abx via j-tube Waiting on INR to reach therapeutic levels Home hopefully Friday with home health nursing for tube feeds, dressing changes, drain management - will go with current pain regimen and abx via j-tube   LOS: 38 days    Cynthia Farrington K. 12/17/2012

## 2012-12-17 NOTE — Telephone Encounter (Signed)
Error

## 2012-12-17 NOTE — Progress Notes (Signed)
ANTICOAGULATION CONSULT NOTE - Follow-Up  Pharmacy Consult:  Coumadin Indication:  h/o PE  Allergies  Allergen Reactions  . Avelox [Moxifloxacin Hcl In Nacl] Nausea And Vomiting  . Betadine [Povidone Iodine] Itching and Rash  . Alendronate Sodium Nausea And Vomiting  . Aspirin Nausea Only  . Codeine Nausea And Vomiting  . Doxycycline     Pt doesn't remember reaction  . Fluconazole     Pt doesn't remember reaction  . Hydrocodone Nausea And Vomiting    GI distress  . Neurontin [Gabapentin] Other (See Comments)    Mood changes   . Nsaids Other (See Comments)    Severe gastritis & perforation - avoid NSAIDs when possible  . Sertraline Hcl Other (See Comments)    Hallucinations   . Sulfa Antibiotics Rash   Patient Measurements: Height: 5' 4.96" (165 cm) Weight: 93 lb 14.7 oz (42.6 kg) IBW/kg (Calculated) : 56.91  Vital Signs: Temp: 98.7 F (37.1 C) (08/27 0554) Temp src: Oral (08/27 0554) BP: 99/55 mmHg (08/27 0554) Pulse Rate: 94 (08/27 0554) Intake/Output from previous day: 08/26 0701 - 08/27 0700 In: 6109.8 [I.V.:1418.7; ZO/XW:9604.3; TPN:2503.9] Out: 1726 [Urine:1500; Drains:225; Stool:1] Intake/Output from this shift:   Labs:  Recent Labs  12/15/12 1155 12/16/12 0500 12/17/12 0515  WBC 12.6*  --   --   HGB 10.5*  --   --   HCT 31.2*  --   --   PLT 471*  --   --   INR 1.44 1.44 1.49    Recent Labs  12/15/12 1155  PREALBUMIN 11.4*   Estimated Creatinine Clearance: 47.1 ml/min (by C-G formula based on Cr of 0.49).   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  She was admitted with abd pain. s/p exp lap 7/22 with LOA, SBR, wound vac and J-tube. Pt on TPN earlier this admit 7/22-7/27 post-op while waiting for J-tube to be ready to be used. Pt on J-tube feedings 7/27-8/17. Pt now with ileus so TF on hold and TPN restarted.  TF has now advsnced to [place] and TPN will be dced.   AC: Coumadin for h/o PE, no bleeding. INR  1.49.  No bleeding noted.  ID: Ertapenem restarted 8/19 for enteritis. MD wishes to change to per tube Augmentin. Afeb  Zosyn 7/21 >> 7/31 Ertapenem 7/31>>8/9; 8/19>>8/26 Micafungin 8/2 >> 8/11 Augmentin  8/26>>  7/31 peritoneal fluid>> few candida albicans  Best Practices: coumadin  Plan: - Will give Warfarin 7.5 mg x 1 today  -Cont Augmentin 500 mg per tube q12 hours  Talbert Cage, PharmD Clinical Pharmacist Pager:  [phone] Thank you for allowing pharmacy to be part of this patients care team. 12/17/2012, 9:05 AM

## 2012-12-18 LAB — PROTIME-INR: INR: 1.67 — ABNORMAL HIGH (ref 0.00–1.49)

## 2012-12-18 MED ORDER — HYDROMORPHONE HCL PF 1 MG/ML IJ SOLN
1.0000 mg | INTRAMUSCULAR | Status: DC | PRN
  Filled 2012-12-18: qty 1

## 2012-12-18 MED ORDER — MORPHINE SULFATE 10 MG/5ML PO SOLN
10.0000 mg | ORAL | Status: DC | PRN
  Administered 2012-12-18 – 2012-12-19 (×8): 10 mg
  Filled 2012-12-18 (×8): qty 5

## 2012-12-18 MED ORDER — WARFARIN SODIUM 7.5 MG PO TABS
7.5000 mg | ORAL_TABLET | Freq: Once | ORAL | Status: AC
  Administered 2012-12-18: 7.5 mg via ORAL
  Filled 2012-12-18: qty 1

## 2012-12-18 NOTE — Progress Notes (Addendum)
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  S/P exploratory laparotomy with lysis of adhesions, small bowel resection, and application of wound VAC 7/22. CT aspiration of abdominal fluid collection 7/31.   CT Abdomen/pelvis ---> small bowel wall thickening, most consistent with enteritis; favoring infection and ileus.  Jejunostomy tube replaced 8/22.  TPN discontinued.    Vital AF 1.2 formula infusing at 55 ml/hr providing 1584 kcals, 99 gm protein, 1070 ml of free water.  Tolerating well.  Height: Ht Readings from Last 1 Encounters:  11/10/12 5' 4.96" (1.65 m)    Weight Status ---> fluctuating  Wt Readings from Last 1 Encounters:  12/17/12 94 lb 2.2 oz (42.7 kg)    Body mass index is 15.68 kg/(m^2).  8/24  93 lb 8/23  95 lb 8/21  83 lb 8/20  84 lb 8/19  80 lb 7/27 104 lb 7/26 113 lb 7/23 102 lb 7/21  95 lb  Re-estimated needs:  Kcal: 1500-1700 Protein: 80-95 gm Fluid: 1.5-1.7 L  Skin: bilateral abdominal incision   Diet Order: NPO   Intake/Output Summary (Last 24 hours) at 12/18/12 1059 Last data filed at 12/18/12 1026  Gross per 24 hour  Intake   1400 ml  Output   1100 ml  Net    300 ml    Labs:   Recent Labs Lab 12/12/12 0619 12/14/12 0510  NA 136 133*  K 4.6 4.9  CL 101 96  CO2 27 30  BUN 9 16  CREATININE 0.49* 0.49*   CALCIUM 9.0 9.6  MG 1.6 1.7  PHOS 3.7  --   GLUCOSE 93 111*    Scheduled Meds: . amoxicillin-clavulanate  500 mg Per Tube Q12H  . feeding supplement (VITAL AF 1.2 CAL)  1,000 mL Per Tube Q24H  . fentaNYL  100 mcg Transdermal Q72H  . FLUoxetine  20 mg Per Tube Daily  . lidocaine  15 mL Intradermal Once  . sodium chloride  10-40 mL Intracatheter Q12H  . warfarin  7.5 mg Oral ONCE-1800  . warfarin   Does not apply Once  . Warfarin - Pharmacist Dosing Inpatient   Does not apply q1800    Continuous Infusions:   Maureen Chatters, RD, LDN Pager #: [phone] After-Hours Pager #: [phone]

## 2012-12-18 NOTE — Progress Notes (Signed)
37 Days Post-Op  Subjective: Pain only around drain site - some leaking around tube 200 cc output INR 1.69 Having some trouble administering liquid meds via j-tube.  No problems with tube feeds. Will try to use only fentanyl patch and morphine liquid in j-tube.  Will try to hold off any IV meds to see if patient is ready for discharge.    Objective: Vital signs in last 24 hours: Temp:  [97.6 F (36.4 C)-98.4 F (36.9 C)] 97.6 F (36.4 C) (08/28 0618) Pulse Rate:  [91-100] 100 (08/28 0618) Resp:  [16-18] 16 (08/28 0618) BP: (90-111)/(53-89) 111/89 mmHg (08/28 0618) SpO2:  [99 %-100 %] 100 % (08/28 0618) Weight:  [94 lb 2.2 oz (42.7 kg)] 94 lb 2.2 oz (42.7 kg) (08/27 1642) Last BM Date: 12/17/12  Intake/Output from previous day: 08/27 0701 - 08/28 0700 In: 760 [I.V.:320; NG/GT:440] Out: 600 [Urine:400; Drains:200] Intake/Output this shift:    Drain - green output; slight erythema around drain site; midline wound granulating well  Lab Results:   Recent Labs  12/15/12 1155  WBC 12.6*  HGB 10.5*  HCT 31.2*  PLT 471*   BMET No results found for this basename: NA, K, CL, CO2, GLUCOSE, BUN, CREATININE, CALCIUM,  in the last 72 hours PT/INR  Recent Labs  12/17/12 0515 12/18/12 0600  LABPROT 17.6* 19.2*  INR 1.49 1.67*   ABG No results found for this basename: PHART, PCO2, PO2, HCO3,  in the last 72 hours  Studies/Results: No results found.  Anti-infectives: Anti-infectives   Start     Dose/Rate Route Frequency Ordered Stop   12/16/12 1000  amoxicillin-clavulanate (AUGMENTIN) 250-62.5 MG/5ML suspension 500 mg     500 mg Per Tube Every 12 hours 12/16/12 0941     12/09/12 1000  ertapenem (INVANZ) 1 g in sodium chloride 0.9 % 50 mL IVPB  Status:  Discontinued     1 g 100 mL/hr over 30 Minutes Intravenous Every 24 hours 12/09/12 0852 12/16/12 0927   11/22/12 2200  micafungin (MYCAMINE) 100 mg in sodium chloride 0.9 % 100 mL IVPB     100 mg 100 mL/hr over 1 Hours  Intravenous Every 24 hours 11/22/12 2103 12/01/12 2215   11/20/12 0730  ertapenem (INVANZ) 1 g in sodium chloride 0.9 % 50 mL IVPB     1 g 100 mL/hr over 30 Minutes Intravenous Every 24 hours 11/20/12 0709 11/29/12 0653   11/10/12 1000  piperacillin-tazobactam (ZOSYN) IVPB 3.375 g  Status:  Discontinued     3.375 g 12.5 mL/hr over 240 Minutes Intravenous 3 times per day 11/10/12 0937 11/20/12 0709      Assessment/Plan: s/p Procedure(s): EXPLORATORY LAPAROTOMY (N/A) SMALL BOWEL RESECTION (N/A) APPLICATION OF WOUND VAC (N/A) PLACEMENT OF FEEDING JEJUNOSTOMY TUBE (N/A) LYSIS OF ADHESION (N/A) Ask IR to evaluate the j-tube to assess patency D/C IV fluids and most IV meds Possible discharge tomorrow (Friday) or next Tuesday (after holiday) with tube feeding, Coumadin, Augmentin, dressing changes, drain.  LOS: 39 days    Yobana Culliton K. 12/18/2012

## 2012-12-18 NOTE — Progress Notes (Signed)
ANTICOAGULATION CONSULT NOTE - Follow-Up  Pharmacy Consult:  Coumadin Indication:  h/o PE  Allergies  Allergen Reactions  . Avelox [Moxifloxacin Hcl In Nacl] Nausea And Vomiting  . Betadine [Povidone Iodine] Itching and Rash  . Alendronate Sodium Nausea And Vomiting  . Aspirin Nausea Only  . Codeine Nausea And Vomiting  . Doxycycline     Pt doesn't remember reaction  . Fluconazole     Pt doesn't remember reaction  . Hydrocodone Nausea And Vomiting    GI distress  . Neurontin [Gabapentin] Other (See Comments)    Mood changes   . Nsaids Other (See Comments)    Severe gastritis & perforation - avoid NSAIDs when possible  . Sertraline Hcl Other (See Comments)    Hallucinations   . Sulfa Antibiotics Rash   Patient Measurements: Height: 5' 4.96" (165 cm) Weight: 94 lb 2.2 oz (42.7 kg) IBW/kg (Calculated) : 56.91  Vital Signs: Temp: 98.8 F (37.1 C) (08/28 0958) Temp src: Oral (08/28 0958) BP: 102/56 mmHg (08/28 0958) Pulse Rate: 98 (08/28 0958) Intake/Output from previous day: 08/27 0701 - 08/28 0700 In: 1400 [I.V.:960; NG/GT:440] Out: 850 [Urine:400; Drains:450] Intake/Output from this shift: Total I/O In: -  Out: 250 [Drains:250] Labs:  Recent Labs  12/15/12 1155 12/16/12 0500 12/17/12 0515 12/18/12 0600  WBC 12.6*  --   --   --   HGB 10.5*  --   --   --   HCT 31.2*  --   --   --   PLT 471*  --   --   --   INR 1.44 1.44 1.49 1.67*    Recent Labs  12/15/12 1155  PREALBUMIN 11.4*   Estimated Creatinine Clearance: 47.3 ml/min (by C-G formula based on Cr of 0.49).   Assessment:  Pt on coumadin for h/o PE. INR up to 1.67 today - finally moving past 2 doses of 7.5mg . No bleeding noted.  Goal INR: 2-3  Plan: - Will give Warfarin 7.5 mg x 1 again today  - F/u INR  Thank you for allowing pharmacy to be part of this patients care team.  Christoper Fabian, PharmD, BCPS Clinical pharmacist, pager [phone] 12/18/2012, 10:32 AM

## 2012-12-19 ENCOUNTER — Telehealth (INDEPENDENT_AMBULATORY_CARE_PROVIDER_SITE_OTHER): Payer: Self-pay | Admitting: General Surgery

## 2012-12-19 ENCOUNTER — Telehealth: Payer: Self-pay

## 2012-12-19 LAB — PROTIME-INR: Prothrombin Time: 18.8 seconds — ABNORMAL HIGH (ref 11.6–15.2)

## 2012-12-19 MED ORDER — WARFARIN SODIUM 2.5 MG PO TABS: 7.5000 mg | ORAL_TABLET | Freq: Every day | ORAL | Status: DC

## 2012-12-19 MED ORDER — FLUOXETINE HCL 20 MG/5ML PO SOLN: 20.0000 mg | Freq: Every day | ORAL | Status: DC

## 2012-12-19 MED ORDER — AMOXICILLIN-POT CLAVULANATE 250-62.5 MG/5ML PO SUSR: 500.0000 mg | Freq: Two times a day (BID) | ORAL | Status: DC

## 2012-12-19 MED ORDER — WARFARIN SODIUM 10 MG PO TABS
10.0000 mg | ORAL_TABLET | Freq: Once | ORAL | Status: DC
  Filled 2012-12-19: qty 1

## 2012-12-19 MED ORDER — FENTANYL 50 MCG/HR TD PT72: 2.0000 | MEDICATED_PATCH | TRANSDERMAL | Status: DC

## 2012-12-19 MED ORDER — MORPHINE SULFATE 10 MG/5ML PO SOLN: 10.0000 mg | ORAL | Status: DC | PRN

## 2012-12-19 NOTE — Telephone Encounter (Signed)
Dr [name] sent me a message in Epic to send notes to Dr Sima Matas. Notes was faxed to Attn: Dr Lorin Picket for doctor review

## 2012-12-19 NOTE — Progress Notes (Signed)
Pt alert and oriented. Vital signs stable. Pain managed with prn pain medications. GJ tube flushing. Discharge to home with home health orders received, teaching provided. Pt awaiting ride home, transferred with wheelchair. Tube feeding stopped.  Aldean Ast 12/19/2012

## 2012-12-19 NOTE — Progress Notes (Signed)
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     PT Comments   Pt moving well.  Pt in bathroom getting herself dressed upon arrival to room.  Pt ambulates at independent level & states she has been ambulating hallway by herself.    Pt also completed stair training this session.  PT services now signing off.     Follow Up Recommendations  Home health PT;Supervision - Intermittent     Does the patient have the potential to tolerate intense rehabilitation     Barriers to Discharge        Equipment Recommendations  None recommended by PT    Recommendations for Other Services    Frequency Min 3X/week   Progress towards PT Goals    Plan Current plan remains appropriate    Precautions / Restrictions Precautions Precautions: Fall Restrictions Weight Bearing Restrictions: No   Pertinent Vitals/Pain 7/10 pain at tube feeding site    Mobility  Bed Mobility Bed Mobility: Not assessed Transfers Transfers: Stand to Sit Stand to Sit: 5: Supervision;With upper extremity assist;To bed Details for Transfer Assistance: Supervision only due to pt getting wrapped in tube feeding line.   Ambulation/Gait Ambulation/Gait Assistance: 7: Independent Ambulation Distance (Feet): 500 Feet Assistive device: None Gait Pattern: Step-through pattern Stairs Assistance: 5: Supervision Stairs Assistance Details (indicate cue type and reason): cueing to slow down due to pt rushing up them Stair Management Technique: One rail Left;Alternating pattern;Forwards Number of Stairs: 4 (3x's) Wheelchair  Mobility Wheelchair Mobility: No      PT Goals (current goals can now be found in the care plan section)    Visit Information  Last PT Received On: 12/19/12 Assistance Needed: +1 History of Present Illness: 67 year old female with PMH of COPD and multiple abdominal surgeries who presents to the ICU post abdominal surgery due to 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      Subjective Data      Cognition  Cognition Arousal/Alertness: Awake/alert Behavior During Therapy: WFL for tasks assessed/performed Overall Cognitive Status: Within Functional Limits for tasks assessed    Balance     End of Session PT - End of Session Activity Tolerance: Patient tolerated treatment well Patient left: Other (comment) (sitting EOB per pt's request) Nurse Communication: Mobility status   GP     Lara Mulch 12/19/2012, 9:19 AM   Verdell Face, PTA [phone] 12/19/2012

## 2012-12-19 NOTE — Discharge Summary (Signed)
Physician Discharge Summary  Patient ID: Cynthia Bowen MRN: [id number] DOB/AGE: 10-02-1945 67 y.o.  Admit date: 11/09/2012 Discharge date: 12/19/2012  Admission Diagnoses:  Small bowel obstruction.      Peritonitis    Chronic gastrocutaneous fistula    Protein-calorie malnutrition    Recent pulmonary embolism    Enteritis  Discharge Diagnoses: Closed loop small bowel obstruction s/p exploratory laparotomy with small bowel resection    Chronic gastrocutaneous fistula    Protein-calorie malnutrition    Pulmonary embolism    Enteritis  Active Problems:   Acute respiratory failure   Altered mental status   HTN (hypertension)   Diabetes COPD Depression Chronic pain  Discharged Condition:  Improved  Hospital Course: The patient was admitted to the hospital on 11/09/12 with lower abdominal pain and plain film showing possible small bowel obstruction.  Her abdominal pain worsened and she was brought to the operating room on 11/11/12 for exploratory laparotomy. She was found to have a closed loop small bowel obstruction and pelvis with a segment of ischemic small bowel.  This segment of small bowel was resected. We placed a feeding jejunostomy tube. We attempted to dissect up around the gastrocutaneous fistula. The stomach is very densely adherent to the abdominal wall and the right upper quadrant. We filled the stomach with saline tinted with blue dye and attempts to cut identify the area of the leak. However no leak was ever seen. We placed a wound VAC over the fascial closure.  The patient remained intubated For 2 days.  Her right upper quadrant drain continued to put out bilious looking fluid. However initially her midline wound seemed to be clean.  On postop day #4 she began having some bilious output from her midline wound VAC. The wound VAC was removed. She was noted to have drainage from the upper end of her incision. A contrast study showed a persistent gastrocutaneous fistula. However  the jejunostomy and small bowel anastomosis appeared to be patent. We then initiated tube feeds through her feeding jejunostomy.  She quickly regained bowel function.Her tube feeds were advanced to a goal rate of 55 ml/hr.  She was transferred to step down on postop day #7.  She had a persistently elevated white blood cell count so we repeated her CT scan. She was having a lot of drainage around the right upper quadrant drain site. The CT scan showed some intra-abdominal fluid collections but no obvious abscess. The drain was upsized to a larger drain. The intra-abdominal fluid collections were aspirated and the cultures Showed yeast..  Her antibiotics were changed to Invanz from Zosyn.  She was started on antifungal therapy as well. She was weaned off of her TNA. The midline wound began to clean up was less drainage from the upper end of the incision.  Her white count began decreasing towards normal.  Pharmacy recommended switching from Xarelto to Coumadin for her pulmonary embolism as Xarelto does not work as well when administered via J-tube.  On postop day #21 she began having difficulty tolerating her tube feeds. She became very distended and was having nausea and vomiting. A repeat CT scan shows some thickening of the small bowel consistent with enteritis. We stopped her tube feeds temporarily and restarted her on antibiotics. We also restarted her TNA to support her nutrition. The symptoms resolved within a few days and she was restarted on her tube feeds.  Her feeding tube was inadvertently dislodged and had to be replaced by interventional radiology. This was done  successfully.She is back to goal rate on her tube feeds. She is near therapeutic levels on her INR. Her wound is cleaning up very nicely and the drainage from the upper end of the incision has almost stopped. The rest of the wound is clean and well granulated. She continues to have bilious output from her right upper quadrant drain. She still has  some slight skin irritation around the drain site but this has improved with the use of barrier creams.  She will continue Coumadin as well as Augmentin via her jejunostomy tube. She will continue with daily wet-to-dry dressing changes. Home health has been arranged to help with drain care wound care as well as tube feeds. She is receiving Vital tube feeds at a rate of 55 mL per hour.  Consults: Pharmacy   Nutrition   Interventional Radiology   [facility]  Significant Diagnostic Studies: CT Abd Pelvis 12/08/12 RADIOLOGY REPORT*  Clinical Data: Abdominal distention. Persistent drainage. New  ileus. Status post exploratory laparotomy with small bowel  resection and placement of jejunostomy tube. Lysis of adhesions.  CT ABDOMEN AND PELVIS WITH CONTRAST  Technique: Multidetector CT imaging of the abdomen and pelvis was  performed following the standard protocol during bolus  administration of intravenous contrast.  Contrast: 80mL OMNIPAQUE IOHEXOL 300 MG/ML SOLN  Comparison: Acute abdomen series 12/03/2012. Most recent CT  11/19/2012.  Findings: Lung bases: Mild left greater right bibasilar airspace  disease. Slightly improved left base aeration, likely due to  atelectasis.  Cardiomegaly, with a similar small pericardial effusion. Small  left-sided pleural effusion is similar to minimally decreased.  Abdomen/pelvis: Normal liver, spleen. The stomach appears to  communicate with the cavity within the right upper quadrant, the  site of prior percutaneous drain placement. Example image  35/series 2. There is no evidence of residual fluid collection in  this area. This area may communicate with the midline abdominal  wound with gas seen within. Example images 37 - 38/series 2.  Fluid collection anterior to the stomach is nearly completely  resolved, with trace fluid and enhancement identified at 1.9 x 1.4  cm on image 30/series 2.  A left upper quadrant subdiaphragmatic  collection is decreased and  measures 2.3 x 1.1 cm today versus 5.6 x 3.9 cm on the prior.  Normal pancreas, gallbladder, biliary tract, adrenal glands. Right  renal calculi. Renal cysts and too small to characterize lesions.  Dense atherosclerosis at the origin of the right renal artery and  throughout the aorta. No retroperitoneal or retrocrural adenopathy.  The colon is normal in caliber.  Moderate length segment of the mid small bowel wall thickening,  including on images 45 - 50/series 2. Mild small bowel dilatation  both proximal and distal to this site, including 3.9 cm proximally  on image 37/series 2 and 4.1 cm distally on image 53/series 2.  Enterotomy site identified in the right side of the abdomen on  image 46 of series 2. Decrease size of ileal mesenteric fluid  collection. Posteriorly, this measures 1.9 cm on image 57 versus  3.7 cm on the prior. Inferiorly and laterally, this measures 2.1  cm on image 60 versus 2.9 cm on the prior.  No new fluid collections are identified. No pneumatosis or free  intraperitoneal air.  Percutaneous jejunostomy tube is identified. Distal small bowel  loops are relatively decompressed, but no focal transition point is  identified.  No pelvic adenopathy. Hysterectomy. Normal urinary bladder.  Probable right ovarian residual follicle at 2.2  cm. Trace cul-de-  sac fluid.  Bones/Musculoskeletal: No acute osseous abnormality. Advanced  degenerative disc disease at L4-L5.  IMPRESSION:  1. Moderate segment mid small bowel wall thickening, most  consistent with enteritis. Favor infection. Mild proximal mid  small bowel dilatation, without focal transition point. Favor  adynamic ileus.  2. Overall improved appearance of multiple abdominal pelvic fluid  collections/abscesses.  3. Gastric antrum appears to communicate with the right upper  quadrant cavity (site of percutaneous drain) and likely the  anterior abdominal wall.  4. Small left  pleural effusion with left greater than right  bibasilar atelectasis.  5. Right nephrolithiasis.  Original Report Authenticated By: Jeronimo Greaves, M.D.   11/20/12 CT Aspiration RADIOLOGY REPORT*  CT GUIDED ASPIRATION OF  Date: 11/20/2012  Clinical History: 67 year old female with complex medical history  including multiple intra-abdominal fluid collections status post  surgery. CT guided aspiration of the largest left upper quadrant  fluid collection is requested. If the fluid is obviously purulent,  a drain will be left in place.  Procedures Performed:  1. CT guided aspiration of left upper quadrant fluid collection  Interventional Radiologist: Sterling Big, MD  Sedation: Moderate (conscious) sedation was used. One mg Versed,  50 mcg Fentanyl were administered intravenously. The patient's  vital signs were monitored continuously by radiology nursing  throughout the procedure.  Sedation Time: 10 minutes  PROCEDURE/FINDINGS:  Informed consent was obtained from the patient following  explanation of the procedure, risks, benefits and alternatives.  The patient understands, agrees and consents for the procedure.  All questions were addressed. A time out was performed.  Maximal barrier sterile technique utilized including caps, mask,  sterile gowns, sterile gloves, large sterile drape, hand hygiene,  and betadine skin prep.  A planning axial CT scan was performed. The left upper quadrant  fluid collection was successfully identified. A suitable skin  entry site was selected and marked. Local anesthesia was obtained  by infiltration with 1% lidocaine. An 18 gauge trocar needle was  advanced over rib and into the fluid collection. Approximately 28  ml of brownish red serosanguinous fluid containing fibrinous debris  was successfully aspirated. There is no frank purulence or foul-  smelling. The appearance of fluid was most consistent with a  liquefied hematoma. The absence of  definitive infection, decision  was made not to leave a drain in place. The fluid will be sent for  Gram stain and culture. The needle was removed. A sterile bandage  was placed. The patient tolerated the procedure well. There is no  immediate complication.  IMPRESSION:  CT guided aspiration of left upper quadrant fluid collection.  Approximately 28 ml of reddish brown serosanguinous fluid  containing fibrinous debris was successfully aspirated. The  appearance of fluid is most consistent with old liquefied hematoma.  Given the absence of obvious purulence, a drain was not left in  place.  The fluid will be sent for culture.  Signed,  Sterling Big, MD  Vascular & Interventional Radiologist  [facility] Radiology  CT Scan 11/19/12 RADIOLOGY REPORT*  Clinical Data: Gastrocutaneous fistula. Recent exploratory  laparotomy. Persistent elevated white count.  CT ABDOMEN AND PELVIS WITH CONTRAST  Technique: Multidetector CT imaging of the abdomen and pelvis was  performed following the standard protocol during bolus  administration of intravenous contrast.  Contrast: OMNIPAQUE IOHEXOL 350 MG/ML SOLN  Comparison: 11/10/2012  Findings: Open midline abdominal incision. There is oral contrast  noted within the incision and anterior abdominal wall soft tissues.  Cannot exclude persistent gastrocutaneous fistula.  Multiple fluid collections throughout the abdomen and pelvis. The  largest is left subdiaphragmatic measuring 5.6 x 3.9 cm on image  19. There is fluid collection containing gas along the left liver  margin anterior to the stomach measuring 5.2 x 1.8 cm. Within the  upper pelvis to the right of midline, there is a 3.7 x 2.1 cm fluid  collection on image 57. Superior to this collection is a 3.3 cm  fluid collection. Multiloculated fluid collections adjacent to the  transverse colon posterior to the suture lines. There is a  jejunostomy catheter in place. Small amount of  free fluid in the  pelvis. 2.4 cm cyst within the right ovary. No left adnexal mass.  Prior hysterectomy.  Liver, spleen, pancreas, adrenals are unremarkable. Multiple cysts  in the kidneys bilaterally. Aorta and iliac vessels are heavily  calcified, non-aneurysmal.  IMPRESSION:  Numerous abdominal and pelvic fluid collections, most likely  abscesses.  Open midline incision. There is oral contrast within the incision  and the anterior soft tissues deep to the incision. Cannot exclude  persistent gastrocutaneous fistula.  Original Report Authenticated By: Charlett Nose, M.D.  11/19/12 RADIOLOGY REPORT*  Clinical Data: Chest pain  CT ANGIOGRAPHY CHEST  Technique: Multidetector CT imaging of the chest using the  standard protocol during bolus administration of intravenous  contrast. Multiplanar reconstructed images including MIPs were  obtained and reviewed to evaluate the vascular anatomy.  Contrast: OMNIPAQUE IOHEXOL 350 MG/ML SOLN total as contrast  had to be real administered due to bolus timing.  Comparison: CT scan of August 05, 2012.  Findings: Mild left pleural effusion is noted with adjacent  subsegmental atelectasis. Minimal subsegmental atelectasis is  noted posteriorly in the right lung base. Atherosclerotic  calcifications are seen involving the thoracic aorta. No  dissection is noted. Peripheral filling defects are noted in the  lower lobe branch of the left pulmonary artery consistent with  residual or chronic thrombus from large pulmonary embolus noted on  prior exam. No definite evidence of new thrombus is noted.  IMPRESSION:  Small filling defect is noted in the lower lobe branch of left  pulmonary artery consistent with chronic or residual thrombus from  previous pulmonary embolus seen in this area on prior exam. No  definite evidence of new thrombus is noted. Mild left pleural  effusion is noted with adjacent subsegmental atelectasis.  Original Report  Authenticated By: Lupita Raider., M.D.   CT Abd/ Pelvis 11/09/12  *RADIOLOGY REPORT*  Clinical Data: Abdominal pain all over. Drain in place. Known  gastrocutaneous fistula.  CT ABDOMEN AND PELVIS WITH CONTRAST  Technique: Multidetector CT imaging of the abdomen and pelvis was  performed following the standard protocol during bolus  administration of intravenous contrast.  Contrast: 80mL OMNIPAQUE IOHEXOL 300 MG/ML SOLN  Comparison: 11/01/2012  Findings: Small left pleural effusion with basilar atelectasis.  This is stable. Pericardial effusion is stable.  Right upper quadrant pigtail drainage catheter. Small amount of  residual fluid around the catheter and extending underneath the  liver along the catheter tract. Gas along the catheter tract. No  significant change. There is inflammatory thickening of the  stomach wall with infiltration extending from the stomach to the  skin surface. Contrast material within the stripe consistent with  gastrocutaneous fistula. This also appears stable. There is free  fluid in the upper abdomen around the liver edge and around the  spleen and extending down to the pelvis. Density measurements  are  indeterminate. Infected fluid or hemorrhagic fluid is not  excluded. No free intra-abdominal air. Pelvic small bowel loops  demonstrate mild dilatation with wall thickening. This is new  since the previous study. Infiltration in the mesentery around  these loops. Findings could represent gastroenteritis or small  bowel ischemia.  The liver, spleen, gallbladder, pancreas, adrenal glands, inferior  vena cava, and retroperitoneal lymph nodes are unremarkable.  Calcification of the abdominal aorta without aneurysm. Multiple  cysts and stones in the kidneys without change since previous  study. No hydronephrosis in either kidney. The stomach is not  abnormally distended. The colon is decompressed.  Pelvis: No bladder wall thickening. Uterus appears to be   surgically absent. No abnormal adnexal masses. No evidence of  diverticulitis. Appendix is not identified. Degenerative changes  in the spine. No destructive bone lesions appreciated.  IMPRESSION:  Stable appearance of right upper quadrant drainage catheter.  Stable fistula from stomach to skin surface. Contrast material is  demonstrated within the fistula. There is developing free fluid in  the abdomen and pelvis with developing mild pelvic small bowel  distension and wall thickening. Changes may represent developing  enteritis versus developing bowel ischemia. Small bowel ischemia  should be excluded. Pericardial effusion and small left pleural  effusion as previously.  Results were discussed with the patient's nurse on six N surgical,  Clydie Braun, at 0246 hours on 11/10/2012.  Original Report Authenticated By: Burman Nieves, M.D.   Treatments: IV hydration, surgery: Exploratory laparotomy, lysis of adhesions, small bowel resection, placement of feeding jejunstomy tube and IR Replacement of feeding jejunostomy tube/ IR aspiration of intra-abdominal fluid collection; IV antibiotics for intra-abdominal infection/ enteritis  Discharge Exam: Blood pressure 103/57, pulse 98, temperature 98.2 F (36.8 C), temperature source Oral, resp. rate 18, height 5' 4.96" (1.65 m), weight 94 lb 2.2 oz (42.7 kg), SpO2 98.00%. General appearance: alert, cooperative and no distress Resp: clear to auscultation bilaterally Cardio: regular rate and rhythm, S1, S2 normal, no murmur, click, rub or gallop GI: soft, non-distended; slight irritation/ erythema around RUQ drain Drain with greenish output - about 250 cc day Midline wound - very clean; granulating well; minimal drainage  Disposition: 01-Home or Self Care Home health nursing - drain care, tube feeds - Vital at 55 ml/ hr, dressing changes  Discharge Orders   Future Orders Complete By Expires   Call MD for:  persistant nausea and vomiting  As  directed    Call MD for:  redness, tenderness, or signs of infection (pain, swelling, redness, odor or green/yellow discharge around incision site)  As directed    Call MD for:  severe uncontrolled pain  As directed    Call MD for:  temperature >100.4  As directed    Change dressing (specify)  As directed    Comments:     Dressing change: one time per day using NS wet to dry dressing Change dressing around drainage tube and feeding tube as needed.   Diet general  As directed    Driving Restrictions  As directed    Comments:     Do not drive while taking pain medications   Increase activity slowly  As directed    May shower / Bathe  As directed    May walk up steps  As directed        Medication List    STOP taking these medications       fluconazole 40 MG/ML suspension  Commonly known as:  DIFLUCAN  oxyCODONE-acetaminophen 5-325 MG per tablet  Commonly known as:  PERCOCET/ROXICET     XARELTO 20 MG Tabs tablet  Generic drug:  Rivaroxaban      TAKE these medications       albuterol 108 (90 BASE) MCG/ACT inhaler  Commonly known as:  PROVENTIL HFA;VENTOLIN HFA  Inhale 2 puffs into the lungs every 4 (four) hours as needed for wheezing or shortness of breath.     amoxicillin-clavulanate 250-62.5 MG/5ML suspension  Commonly known as:  AUGMENTIN  Place 10 mLs (500 mg total) into feeding tube every 12 (twelve) hours.     diphenhydrAMINE 12.5 MG/5ML liquid  Commonly known as:  BENADRYL  Take 12.5 mg by mouth 4 (four) times daily.     EPIPEN 0.3 mg/0.3 mL Soaj injection  Generic drug:  EPINEPHrine  Inject 0.3 mg into the muscle daily as needed (anaphylaxis).     fentaNYL 50 MCG/HR  Commonly known as:  DURAGESIC - dosed mcg/hr  Place 2 patches (100 mcg total) onto the skin every 3 (three) days.     FLUoxetine 20 MG/5ML solution  Commonly known as:  PROZAC  Place 5 mLs (20 mg total) into feeding tube daily.     morphine 10 MG/5ML solution  Place 5 mLs (10 mg total)  into feeding tube every 3 (three) hours as needed.     nystatin-triamcinolone ointment  Commonly known as:  MYCOLOG  Apply topically 2 (two) times daily.     ondansetron 4 MG disintegrating tablet  Commonly known as:  ZOFRAN-ODT  Take 4 mg by mouth 2 (two) times daily as needed for nausea. Takes with percocet     warfarin 2.5 MG tablet  Commonly known as:  COUMADIN  3 tablets (7.5 mg total) by Per J Tube route daily.           Follow-up Information   Follow up with Wynona Luna., MD. Schedule an appointment as soon as possible for a visit in 2 weeks.   Specialty:  General Surgery   Contact information:   [address] [place] [address] [phone]       Signed: Wynona Luna. 12/19/2012, 7:09 AM

## 2012-12-19 NOTE — Progress Notes (Signed)
Agree with below note and plan. Linna Hoff PT, DPT Pager: [phone]

## 2012-12-19 NOTE — Telephone Encounter (Signed)
Pt was discharged from hospital and was told to come in for weekly rechecks. She is on cumidin. Is a pt of Dr.[name]. Call at [phone].

## 2012-12-19 NOTE — Progress Notes (Signed)
ANTICOAGULATION CONSULT NOTE - Follow-Up  Pharmacy Consult:  Coumadin Indication:  h/o PE  Allergies  Allergen Reactions  . Avelox [Moxifloxacin Hcl In Nacl] Nausea And Vomiting  . Betadine [Povidone Iodine] Itching and Rash  . Alendronate Sodium Nausea And Vomiting  . Aspirin Nausea Only  . Codeine Nausea And Vomiting  . Doxycycline     Pt doesn't remember reaction  . Fluconazole     Pt doesn't remember reaction  . Hydrocodone Nausea And Vomiting    GI distress  . Neurontin [Gabapentin] Other (See Comments)    Mood changes   . Nsaids Other (See Comments)    Severe gastritis & perforation - avoid NSAIDs when possible  . Sertraline Hcl Other (See Comments)    Hallucinations   . Sulfa Antibiotics Rash   Patient Measurements: Height: 5' 4.96" (165 cm) Weight: 94 lb 2.2 oz (42.7 kg) IBW/kg (Calculated) : 56.91  Vital Signs: Temp: 98.2 F (36.8 C) (08/29 0552) Temp src: Oral (08/29 0552) BP: 103/57 mmHg (08/29 0552) Pulse Rate: 98 (08/29 0552) Intake/Output from previous day: 08/28 0701 - 08/29 0700 In: 2416.3 [P.O.:44; NG/GT:1932.3] Out: 350 [Drains:350] Intake/Output from this shift:   Labs:  Recent Labs  12/17/12 0515 12/18/12 0600 12/19/12 0434  INR 1.49 1.67* 1.62*   No results found for this basename: NA, K, CL, CO2, GLUCOSE, BUN, CREATININE, LABCREA, CREAT24HRUR, CALCIUM, MG, PHOS, PROT, ALBUMIN, AST, ALT, ALKPHOS, BILITOT, BILIDIR, IBILI, PREALBUMIN, TRIG, CHOLHDL, CHOL,  in the last 72 hours Estimated Creatinine Clearance: 47.3 ml/min (by C-G formula based on Cr of 0.49).   Assessment:  Pt on coumadin for h/o PE. INR down to 1.62 today. No bleeding noted.  Goal INR: 2-3  Plan: - Will give Warfarin 10mg  x 1 today  - F/u INR  Thank you for allowing pharmacy to be part of this patients care team.  Talbert Cage, PharmD Clinical pharmacist, pager [phone] 12/19/2012, 8:13 AM

## 2012-12-19 NOTE — Telephone Encounter (Signed)
Her home health nurse called about tube feed orders.  I cannot see any recommendations by nutrition or discharge surgeon.  She was tolerating vitale AF 1.2 at 29ml/hr in the hospital so we will continue this from 7pm-7am until Tuesday when she can call her surgeon to get further recommendations as to whether this will be continuous or just partial day.

## 2012-12-19 NOTE — Progress Notes (Signed)
38 Days Post-Op  Subjective: Pain seems better controlled with j-tube morphine over the IV dilaudid INR still not therapeutic Tolerating tube feeds well J-tube seems to be functioning better  Objective: Vital signs in last 24 hours: Temp:  [98.2 F (36.8 C)-98.8 F (37.1 C)] 98.2 F (36.8 C) (08/29 0552) Pulse Rate:  [98-102] 98 (08/29 0552) Resp:  [16-18] 18 (08/29 0552) BP: (88-103)/(54-62) 103/57 mmHg (08/29 0552) SpO2:  [96 %-100 %] 98 % (08/29 0552) Last BM Date: 12/18/12  Intake/Output from previous day: 08/28 0701 - 08/29 0700 In: 2416.3 [P.O.:44; NG/GT:1932.3] Out: 350 [Drains:350] Intake/Output this shift: Total I/O In: 924 [P.O.:44; Other:440; NG/GT:440] Out: 100 [Drains:100]  General appearance: alert, cooperative and no distress Resp: clear to auscultation bilaterally Cardio: regular rate and rhythm, S1, S2 normal, no murmur, click, rub or gallop GI: soft, non-distended; skin irritation around RUQ drain Midline wound - clean; granulating; minimal drainage  Lab Results:  No results found for this basename: WBC, HGB, HCT, PLT,  in the last 72 hours BMET No results found for this basename: NA, K, CL, CO2, GLUCOSE, BUN, CREATININE, CALCIUM,  in the last 72 hours PT/INR  Recent Labs  12/18/12 0600 12/19/12 0434  LABPROT 19.2* 18.8*  INR 1.67* 1.62*   ABG No results found for this basename: PHART, PCO2, PO2, HCO3,  in the last 72 hours  Studies/Results: No results found.  Anti-infectives: Anti-infectives   Start     Dose/Rate Route Frequency Ordered Stop   12/16/12 1000  amoxicillin-clavulanate (AUGMENTIN) 250-62.5 MG/5ML suspension 500 mg     500 mg Per Tube Every 12 hours 12/16/12 0941     12/09/12 1000  ertapenem (INVANZ) 1 g in sodium chloride 0.9 % 50 mL IVPB  Status:  Discontinued     1 g 100 mL/hr over 30 Minutes Intravenous Every 24 hours 12/09/12 0852 12/16/12 0927   11/22/12 2200  micafungin (MYCAMINE) 100 mg in sodium chloride 0.9 % 100 mL  IVPB     100 mg 100 mL/hr over 1 Hours Intravenous Every 24 hours 11/22/12 2103 12/01/12 2215   11/20/12 0730  ertapenem (INVANZ) 1 g in sodium chloride 0.9 % 50 mL IVPB     1 g 100 mL/hr over 30 Minutes Intravenous Every 24 hours 11/20/12 0709 11/29/12 0653   11/10/12 1000  piperacillin-tazobactam (ZOSYN) IVPB 3.375 g  Status:  Discontinued     3.375 g 12.5 mL/hr over 240 Minutes Intravenous 3 times per day 11/10/12 0937 11/20/12 0709      Assessment/Plan: s/p Procedure(s): EXPLORATORY LAPAROTOMY (N/A) SMALL BOWEL RESECTION (N/A) APPLICATION OF WOUND VAC (N/A) PLACEMENT OF FEEDING JEJUNOSTOMY TUBE (N/A) LYSIS OF ADHESION (N/A) Discharge Pharmacy will determine Coumadin dosage Will need INR checked next week Home health nursing has been arranged.   LOS: 40 days    Amita Atayde K. 12/19/2012

## 2012-12-22 NOTE — Telephone Encounter (Signed)
We can check coumadin Fri morning at 102

## 2012-12-23 ENCOUNTER — Telehealth (INDEPENDENT_AMBULATORY_CARE_PROVIDER_SITE_OTHER): Payer: Self-pay | Admitting: General Surgery

## 2012-12-23 NOTE — Telephone Encounter (Signed)
Selena Batten, nurse with [facility], calling to confirm she will drawn the labs that Dr. [name] ordered.

## 2012-12-23 NOTE — Telephone Encounter (Signed)
Called pt, advised to come in Friday to recheck Coumadin. Pt understood.

## 2012-12-23 NOTE — Telephone Encounter (Signed)
Called Mrs Vadala to let her know that I have made her an apt to see Dr Lorin Picket on 12-31-2012 to be there at 2:45 for a 3:00 apt. And she is to come back to see Dr [name] on 01-06-13 @ 1:30. I mailed out information to the patient about Dr Lorin Picket apt date and for Dr [name]

## 2012-12-26 ENCOUNTER — Telehealth: Payer: Self-pay | Admitting: Radiology

## 2012-12-26 ENCOUNTER — Emergency Department (HOSPITAL_COMMUNITY)
Admission: EM | Admit: 2012-12-26 | Discharge: 2012-12-26 | Disposition: A | Discharge status: Home / Self Care | Payer: Medicare Other | Attending: Emergency Medicine | Admitting: Emergency Medicine

## 2012-12-26 ENCOUNTER — Telehealth (INDEPENDENT_AMBULATORY_CARE_PROVIDER_SITE_OTHER): Payer: Self-pay | Admitting: General Surgery

## 2012-12-26 ENCOUNTER — Ambulatory Visit (INDEPENDENT_AMBULATORY_CARE_PROVIDER_SITE_OTHER): Payer: Federal, State, Local not specified - PPO | Admitting: Internal Medicine

## 2012-12-26 ENCOUNTER — Encounter (INDEPENDENT_AMBULATORY_CARE_PROVIDER_SITE_OTHER): Payer: Self-pay

## 2012-12-26 ENCOUNTER — Emergency Department (HOSPITAL_COMMUNITY): Payer: Medicare Other

## 2012-12-26 VITALS — BP 110/60 | HR 84 | Temp 98.0°F | Resp 16 | Ht 64.0 in | Wt 96.0 lb

## 2012-12-26 DIAGNOSIS — Z87891 Personal history of nicotine dependence: Secondary | ICD-10-CM | POA: Insufficient documentation

## 2012-12-26 DIAGNOSIS — E43 Unspecified severe protein-calorie malnutrition: Secondary | ICD-10-CM

## 2012-12-26 DIAGNOSIS — Z8669 Personal history of other diseases of the nervous system and sense organs: Secondary | ICD-10-CM | POA: Insufficient documentation

## 2012-12-26 DIAGNOSIS — Z8739 Personal history of other diseases of the musculoskeletal system and connective tissue: Secondary | ICD-10-CM | POA: Insufficient documentation

## 2012-12-26 DIAGNOSIS — Z79899 Other long term (current) drug therapy: Secondary | ICD-10-CM | POA: Insufficient documentation

## 2012-12-26 DIAGNOSIS — R634 Abnormal weight loss: Secondary | ICD-10-CM

## 2012-12-26 DIAGNOSIS — K316 Fistula of stomach and duodenum: Secondary | ICD-10-CM

## 2012-12-26 DIAGNOSIS — J4489 Other specified chronic obstructive pulmonary disease: Secondary | ICD-10-CM | POA: Insufficient documentation

## 2012-12-26 DIAGNOSIS — K9413 Enterostomy malfunction: Secondary | ICD-10-CM | POA: Insufficient documentation

## 2012-12-26 DIAGNOSIS — F329 Major depressive disorder, single episode, unspecified: Secondary | ICD-10-CM | POA: Insufficient documentation

## 2012-12-26 DIAGNOSIS — T85528A Displacement of other gastrointestinal prosthetic devices, implants and grafts, initial encounter: Secondary | ICD-10-CM

## 2012-12-26 DIAGNOSIS — I2699 Other pulmonary embolism without acute cor pulmonale: Secondary | ICD-10-CM

## 2012-12-26 DIAGNOSIS — F3289 Other specified depressive episodes: Secondary | ICD-10-CM | POA: Insufficient documentation

## 2012-12-26 DIAGNOSIS — Z792 Long term (current) use of antibiotics: Secondary | ICD-10-CM | POA: Insufficient documentation

## 2012-12-26 DIAGNOSIS — R109 Unspecified abdominal pain: Secondary | ICD-10-CM

## 2012-12-26 DIAGNOSIS — Z8719 Personal history of other diseases of the digestive system: Secondary | ICD-10-CM | POA: Insufficient documentation

## 2012-12-26 DIAGNOSIS — J449 Chronic obstructive pulmonary disease, unspecified: Secondary | ICD-10-CM | POA: Insufficient documentation

## 2012-12-26 DIAGNOSIS — K9403 Colostomy malfunction: Secondary | ICD-10-CM | POA: Insufficient documentation

## 2012-12-26 DIAGNOSIS — IMO0001 Reserved for inherently not codable concepts without codable children: Secondary | ICD-10-CM | POA: Insufficient documentation

## 2012-12-26 DIAGNOSIS — Z8701 Personal history of pneumonia (recurrent): Secondary | ICD-10-CM | POA: Insufficient documentation

## 2012-12-26 DIAGNOSIS — Z7901 Long term (current) use of anticoagulants: Secondary | ICD-10-CM | POA: Insufficient documentation

## 2012-12-26 LAB — PROTIME-INR
INR: 1.91 — ABNORMAL HIGH (ref 0.00–1.49)
Prothrombin Time: 21.3 seconds — ABNORMAL HIGH (ref 11.6–15.2)

## 2012-12-26 MED ORDER — MORPHINE SULFATE 4 MG/ML IJ SOLN
4.0000 mg | Freq: Once | INTRAMUSCULAR | Status: AC
  Administered 2012-12-26: 4 mg via INTRAMUSCULAR
  Filled 2012-12-26: qty 1

## 2012-12-26 MED ORDER — IOHEXOL 300 MG/ML  SOLN
50.0000 mL | Freq: Once | INTRAMUSCULAR | Status: AC | PRN
  Administered 2012-12-26: 50 mL

## 2012-12-26 NOTE — Progress Notes (Signed)
Subjective:    Patient ID: Cynthia Bowen, female    DOB: 08/31/1945, 67 y.o.   MRN: [id number]  HPI  67 YO patient comes in today to follow up after being in the hospital more days than not since 3/14, following complications from abdominal surgery needed to r/o malignancy.  After recent discharge, Pt is still NPO except beginning fluids, and using a G tube for nutrition. She has home health coming in to monitor her blood pressure, administer tube feedings, and change her dressings(see abscess drain). She is doing better than she was a few weeks ago.  She has been on coumadin(changed recently from Xarelto due to better absorption thru tube) and has her blood work monitored regularly- weekly-last INR just below 2.  She had a PTE in April following prolonged immobilization (1. Large extensive pulmonary embolus in the left lower lobe pulmonary artery. Smaller emboli in the left upper lobe pulmonary artery. 2. Possible infarction in the left lower lobe. 3. Diffuse emphysema.) It is unknown where the blood clot originated from. She was told she would have to be on the anticoagulation for 6 months to a year. 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 No definite evidence of new thrombus is noted. Mild left pleural effusion is noted with adjacent subsegmental atelectasis.)   She would like her incision looked at while she is here, because her dressing is soaked all the time/skin breaking down around tube.  Meds at discharge: albuterol 108 (90 BASE) MCG/ACT inhaler   Commonly known as: PROVENTIL HFA;VENTOLIN HFA   Inhale 2 puffs into the lungs every 4 (four) hours as needed for wheezing or shortness of breath.    amoxicillin-clavulanate 250-62.5 MG/5ML suspension   Commonly known as: AUGMENTIN   Place 10 mLs (500  mg total) into feeding tube every 12 (twelve) hours.    diphenhydrAMINE 12.5 MG/5ML liquid   Commonly known as: BENADRYL   Take 12.5 mg by mouth 4 (four) times daily.    EPIPEN 0.3 mg/0.3 mL Soaj injection   Generic drug: EPINEPHrine   Inject 0.3 mg into the muscle daily as needed (anaphylaxis).    fentaNYL 50 MCG/HR   Commonly known as: DURAGESIC - dosed mcg/hr   Place 2 patches (100 mcg total) onto the skin every 3 (three) days.    FLUoxetine 20 MG/5ML solution   Commonly known as: PROZAC   Place 5 mLs (20 mg total) into feeding tube daily.    morphine 10 MG/5ML solution   Place 5 mLs (10 mg total) into feeding tube every 3 (three) hours as needed.    nystatin-triamcinolone ointment   Commonly known as: MYCOLOG   Apply topically 2 (two) times daily.    ondansetron 4 MG disintegrating tablet   Commonly known as: ZOFRAN-ODT   Take 4 mg by mouth 2 (two) times daily as needed for nausea. Takes with percocet    warfarin 2.5 MG tablet   Commonly known as: COUMADIN   3 tablets (7.5 mg total) by Per J Tube route daily.    Depression and anxiety have been long term problems tho she has managed to continue fighting thru this arduous period without giving up.    Review of Systems No fever since discharge no chest pain or SOB No new abd pain No dysuria    Objective:  Physical Exam BP 110/60  Pulse 84  Temp(Src) 98 F (36.7 C) (Oral)  Resp 16  Ht 5\' 4"  (1.626 m)  Wt 96 lb (43.545 kg)  BMI 16.47 kg/m2  SpO2 93% NAD but emaciated/frail Heart reg w/out M Lungs clear with diminished br sounds in general abd dressing removed/wet Lots of wet drainage around [employer] drain in RUQ and there is skin breakdown 3cmx3cm around opening despite her use of barrier cream Incision looks better than my last viewing tho still healing by 2nd intention/no cellulitis J tube inact Tolerates cleaning and redressing by LPN Hansen      Assessment & Plan:   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  Loss of weight/Protein-calorie malnutrition, severe---correcting this will be the best medicine at this point  Depression--stable   Will have her f/u here next week to recheck general condition/our staff called Home health to discuss being more aggressive with wound care to prevent further breakdown//better wet to dry etc

## 2012-12-26 NOTE — Telephone Encounter (Signed)
Called Cynthia Bowen back and told her what Dr [name] had stated about her labs that they are looking better and she may have small sips of liquids, but it would be better not to have to much food or drink in her stomach while we are trying to get the fistula to heal

## 2012-12-26 NOTE — Telephone Encounter (Signed)
Will you please call us about this patient? Our Nurse Elease Etienne wants to speak to you concerning her. Our contact number is [phone], this is urgent.

## 2012-12-26 NOTE — Telephone Encounter (Signed)
Pt's caretaker/ nephew called to report the pt's G-tube has come out.  Instructed him to take her to the hospital for them to replace it. He understands.

## 2012-12-26 NOTE — Telephone Encounter (Signed)
Please let her know that her labs look better.  She may have small sips of liquids, but it would be better not to have to much food or drink in her stomach while we are trying to get the fistula to heal.

## 2012-12-26 NOTE — Progress Notes (Deleted)
  Subjective:    Patient ID: Cynthia Bowen, female    DOB: 04-11-1946, 67 y.o.   MRN: [id number]  HPI    Review of Systems     Objective:   Physical Exam        Assessment & Plan:

## 2012-12-26 NOTE — ED Provider Notes (Signed)
CSN: [id number]     Arrival date & time 12/26/12  1633 History   First MD Initiated Contact with Patient 12/26/12 1638     Chief Complaint  Patient presents with  . feeding tube fell out    (Consider location/radiation/quality/duration/timing/severity/associated sxs/prior Treatment) HPI 67 year old female who has a complicated medical and surgical historyincluding a history of COPD, chronic abdominal pain related to her multiple abdominal procedures. According to the patient she had an anterior approach lumbar surgery and has developed consultations from that surgery requiring multiple subsequent surgeries. At this time she has a gastrocutaneous fistula with an ostomy type back connect for drainage. She also has a Al Pimple drain in her right upper quadrant which is a chronic drain as well. Came in today because J-tube came out. Happened ~30 minutes prior to arrival. Is having abdominal pain but this is ongoing issue. Denies acute change.   Past Medical History  Diagnosis Date  . COPD (chronic obstructive pulmonary disease)   . Pneumonia 12-2011  . GERD (gastroesophageal reflux disease)   . Headache(784.0)   . Arthritis     osteoarthritis  . Allergy   . Depression   . Neuromuscular disorder   . Osteoporosis   . Bronchitis     CURRENTLY AS OF 06/30/12 - HAS COUGH AND FINISHED ANTIBIOTIC FOR BRONCHITIS  . Fibromyalgia   . Pain     ABDOMINAL PAIN AND NAUSEA  . Pain     SOMETIMES PAIN RIGHT EAR AND NECK--STATES CAUSED BY A "LUMP" ON BACK OF EAR--USES KENALOG CREAM TOPICALLY AS NEEDED.  Marland Kitchen Gastrocutaneous fistula    Past Surgical History  Procedure Laterality Date  . Abdominal hysterectomy    . Esophagogastroduodenoscopy  04/18/2012    Procedure: ESOPHAGOGASTRODUODENOSCOPY (EGD);  Surgeon: [name], MD;  Location: [facility];  Service: Endoscopy;  Laterality: N/A;  . Eus  05/29/2012    Procedure: UPPER ENDOSCOPIC ULTRASOUND (EUS) LINEAR;  Surgeon: [name], MD;   Location: [facility];  Service: Endoscopy;  Laterality: N/A;  . Appendectomy    . Spine surgery      CERVICAL SPINE SURGERY X 2 - INCLUDING FUSION; LUMBAR SURGERY FOR RUPTURED DISC  . Eye surgery      BILATERAL CATARACT EXTRACTIONS  . Laparoscopic abdominal exploration N/A 07/02/2012    Procedure: converted to laparotomy with gastric biopsy;  Surgeon: [name]. [name], MD;  Location: [facility];  Service: General;  Laterality: N/A;  Laparoscopic Gastric Biospy attempted.   . Laparotomy N/A 07/07/2012    Procedure: EXPLORATORY LAPAROTOMY repair of gastric perforation with omental graham patch, drainage of abdominal abcess;  Surgeon: [name]. [name], MD;  Location: [facility];  Service: General;  Laterality: N/A;  . Stomach surgery  07/07/2012    Omental patch of gastric perforation  . Laparotomy N/A 07/13/2012    Procedure: EXPLORATORY LAPAROTOMY repair gastric leak;  Surgeon: Mariella Saa, MD;  Location: [facility];  Service: General;  Laterality: N/A;  . Laparotomy N/A 11/11/2012    Procedure: EXPLORATORY LAPAROTOMY;  Surgeon: [name]. [name], MD;  Location: [facility];  Service: General;  Laterality: N/A;  . Bowel resection N/A 11/11/2012    Procedure: SMALL BOWEL RESECTION;  Surgeon: [name]. [name], MD;  Location: [facility];  Service: General;  Laterality: N/A;  . Minor application of wound vac N/A 11/11/2012    Procedure: APPLICATION OF WOUND VAC;  Surgeon: [name]. [name], MD;  Location: [facility];  Service: General;  Laterality: N/A;  . Jejunostomy  N/A 11/11/2012    Procedure: PLACEMENT OF FEEDING JEJUNOSTOMY TUBE;  Surgeon: [name]. [name], MD;  Location: [facility];  Service: General;  Laterality: N/A;  . Lysis of adhesion N/A 11/11/2012    Procedure: LYSIS OF ADHESION;  Surgeon: [name]. [name], MD;  Location: [facility];  Service: General;  Laterality: N/A;   Family History  Problem Relation Age of Onset  . COPD Mother   . Hypertension Maternal Grandmother    History  Substance Use Topics  . Smoking  status: Former Smoker -- 0.50 packs/day for 35 years    Types: Cigarettes    Start date: 01/04/2003    Quit date: 04/17/2005  . Smokeless tobacco: Never Used  . Alcohol Use: No   OB History   Grav Para Term Preterm Abortions TAB SAB Ect Mult Living                 Review of Systems  All systems reviewed and negative, other than as noted in HPI.   Allergies  Avelox; Betadine; Alendronate sodium; Aspirin; Codeine; Doxycycline; Fluconazole; Hydrocodone; Neurontin; Nsaids; Sertraline hcl; and Sulfa antibiotics  Home Medications   Current Outpatient Rx  Name  Route  Sig  Dispense  Refill  . amoxicillin-clavulanate (AUGMENTIN) 250-62.5 MG/5ML suspension   Per Tube   Place 10 mLs (500 mg total) into feeding tube every 12 (twelve) hours.   150 mL   0   . diphenhydrAMINE (BENADRYL) 12.5 MG/5ML liquid   Oral   Take 12.5 mg by mouth 4 (four) times daily.          Marland Kitchen EPINEPHrine (EPIPEN) 0.3 mg/0.3 mL SOAJ   Intramuscular   Inject 0.3 mg into the muscle daily as needed (anaphylaxis).         . fentaNYL (DURAGESIC - DOSED MCG/HR) 50 MCG/HR   Transdermal   Place 2 patches (100 mcg total) onto the skin every 3 (three) days.   40 patch   0   . FLUoxetine (PROZAC) 20 MG/5ML solution   Per Tube   Place 5 mLs (20 mg total) into feeding tube daily.   120 mL   3   . morphine 10 MG/5ML solution   Per Tube   Place 5 mLs (10 mg total) into feeding tube every 3 (three) hours as needed.   250 mL   0   . nystatin-triamcinolone ointment (MYCOLOG)   Topical   Apply topically 2 (two) times daily.   30 g   0   . ondansetron (ZOFRAN-ODT) 4 MG disintegrating tablet   Oral   Take 4 mg by mouth 2 (two) times daily as needed for nausea. Takes with percocet         . warfarin (COUMADIN) 2.5 MG tablet   Per J Tube   3 tablets (7.5 mg total) by Per J Tube route daily.   90 tablet   3    BP 100/57  Pulse 89  Temp(Src) 98.2 F (36.8 C) (Oral)  Resp 20  SpO2 98% Physical  Exam  Nursing note and vitals reviewed. Constitutional: She appears well-developed and well-nourished. No distress.  HENT:  Head: Normocephalic and atraumatic.  Eyes: Conjunctivae are normal. Right eye exhibits no discharge. Left eye exhibits no discharge.  Neck: Neck supple.  Cardiovascular: Normal rate, regular rhythm and normal heart sounds.  Exam reveals no gallop and no friction rub.   No murmur heard. Pulmonary/Chest: Effort normal and breath sounds normal. No respiratory distress.  Abdominal: Soft. There is  tenderness.  R sided abdominal drain with watery brown drainage. Midline abdominal wound with granulation tissue at baseline. External appearance feeding tube site w/o bleeding/drainage. Mild diffuse tenderness.   Musculoskeletal: She exhibits no edema and no tenderness.  Neurological: She is alert.  Skin: Skin is warm and dry.  Psychiatric: She has a normal mood and affect. Her behavior is normal. Thought content normal.    ED Course  Procedures (including critical care time) Labs Review Labs Reviewed - No data to display Imaging Review No results found.  MDM   1. Jejunostomy tube fell out     66yF with jejunostomy tube which came out. Balloon broken. Recently placed. Foley placed to help maintain patency of tract. Passed easily. Has gastrocutaneous fistula. Will consult IR to discuss placement of J-tube.   Discussed with Dr Claudette Laws, IR. [facility] in am for placement.     Raeford Razor, MD 12/31/12 [phone]

## 2012-12-26 NOTE — Telephone Encounter (Signed)
Pt called to ask Dr. [name] if she can have anything to eat or drink yet.  She is using the G-tube so far.  She is aware he is not available this afternoon, so may not hear back on this until next week.  Please advise.

## 2012-12-26 NOTE — ED Notes (Signed)
Per EMS-stood up and feeding tube fell out-had abdominal surgery 3 weeks ago, G-tube leaking-has 2 fentanyl patches on left arm

## 2012-12-27 ENCOUNTER — Ambulatory Visit (HOSPITAL_COMMUNITY)
Admission: RE | Admit: 2012-12-27 | Discharge: 2012-12-27 | Disposition: A | Discharge status: Home / Self Care | Payer: Medicare Other | Source: Ambulatory Visit | Attending: Interventional Radiology | Admitting: Interventional Radiology

## 2012-12-27 ENCOUNTER — Other Ambulatory Visit (HOSPITAL_COMMUNITY): Payer: Self-pay | Admitting: Interventional Radiology

## 2012-12-27 DIAGNOSIS — R1319 Other dysphagia: Secondary | ICD-10-CM | POA: Insufficient documentation

## 2012-12-27 DIAGNOSIS — K316 Fistula of stomach and duodenum: Secondary | ICD-10-CM

## 2012-12-27 DIAGNOSIS — IMO0002 Reserved for concepts with insufficient information to code with codable children: Secondary | ICD-10-CM | POA: Insufficient documentation

## 2012-12-27 MED ORDER — IOHEXOL 300 MG/ML  SOLN
50.0000 mL | Freq: Once | INTRAMUSCULAR | Status: AC | PRN
  Administered 2012-12-27: 10 mL via INTRAVENOUS

## 2012-12-27 MED ORDER — CHLORHEXIDINE GLUCONATE 4 % EX LIQD
CUTANEOUS | Status: AC
  Filled 2012-12-27: qty 30

## 2012-12-27 MED ORDER — CEFAZOLIN SODIUM-DEXTROSE 2-3 GM-% IV SOLR
INTRAVENOUS | Status: AC
  Filled 2012-12-27: qty 50

## 2012-12-27 NOTE — Procedures (Signed)
Successful fluoroscopic guided replacement of 18 Fr balloon retention jejunostomy tube.  No immediate post procedural complications.  The feeding tube is ready for immediate use.

## 2012-12-28 ENCOUNTER — Telehealth (INDEPENDENT_AMBULATORY_CARE_PROVIDER_SITE_OTHER): Payer: Self-pay | Admitting: Surgery

## 2012-12-28 NOTE — Telephone Encounter (Signed)
Arturo Morton, a nephew of Ms. Izola Price, called.  Ms. Peplinski has had trouble with her feeding tube over the weekend.  The tube came out and was replaced yesterday.  But then she vomited last PM and the tube came out again.  She does not want to come to the ER to be seen again.  The nephew is asking about "other options", but since I don't know the lady, I have limited things that can offer over the phone.  I suggested coming back to the ER to have the tube replaced.  But she does not want to come.  I think the limited options that I see have offered has frustrated them.  They are going to contact our office Monday, 12/29/2012.  DN

## 2012-12-29 ENCOUNTER — Encounter (HOSPITAL_COMMUNITY): Payer: Self-pay | Admitting: Cardiology

## 2012-12-29 ENCOUNTER — Encounter (HOSPITAL_COMMUNITY): Payer: Self-pay | Admitting: Emergency Medicine

## 2012-12-29 ENCOUNTER — Telehealth (INDEPENDENT_AMBULATORY_CARE_PROVIDER_SITE_OTHER): Payer: Self-pay | Admitting: General Surgery

## 2012-12-29 ENCOUNTER — Emergency Department (HOSPITAL_COMMUNITY)
Admission: EM | Admit: 2012-12-29 | Discharge: 2012-12-29 | Disposition: A | Discharge status: Home / Self Care | Payer: Medicare Other

## 2012-12-29 ENCOUNTER — Emergency Department (HOSPITAL_COMMUNITY): Payer: Medicare Other

## 2012-12-29 ENCOUNTER — Emergency Department (HOSPITAL_COMMUNITY)
Admission: EM | Admit: 2012-12-29 | Discharge: 2012-12-29 | Disposition: A | Discharge status: Home / Self Care | Payer: Medicare Other | Attending: Emergency Medicine | Admitting: Emergency Medicine

## 2012-12-29 DIAGNOSIS — J449 Chronic obstructive pulmonary disease, unspecified: Secondary | ICD-10-CM | POA: Insufficient documentation

## 2012-12-29 DIAGNOSIS — J4489 Other specified chronic obstructive pulmonary disease: Secondary | ICD-10-CM | POA: Insufficient documentation

## 2012-12-29 DIAGNOSIS — Z87891 Personal history of nicotine dependence: Secondary | ICD-10-CM | POA: Insufficient documentation

## 2012-12-29 DIAGNOSIS — IMO0001 Reserved for inherently not codable concepts without codable children: Secondary | ICD-10-CM | POA: Insufficient documentation

## 2012-12-29 DIAGNOSIS — F329 Major depressive disorder, single episode, unspecified: Secondary | ICD-10-CM | POA: Insufficient documentation

## 2012-12-29 DIAGNOSIS — Z90710 Acquired absence of both cervix and uterus: Secondary | ICD-10-CM | POA: Insufficient documentation

## 2012-12-29 DIAGNOSIS — Z7901 Long term (current) use of anticoagulants: Secondary | ICD-10-CM | POA: Insufficient documentation

## 2012-12-29 DIAGNOSIS — Z8701 Personal history of pneumonia (recurrent): Secondary | ICD-10-CM | POA: Insufficient documentation

## 2012-12-29 DIAGNOSIS — Z79899 Other long term (current) drug therapy: Secondary | ICD-10-CM | POA: Insufficient documentation

## 2012-12-29 DIAGNOSIS — Z5189 Encounter for other specified aftercare: Secondary | ICD-10-CM

## 2012-12-29 DIAGNOSIS — Z431 Encounter for attention to gastrostomy: Secondary | ICD-10-CM | POA: Insufficient documentation

## 2012-12-29 DIAGNOSIS — Z8669 Personal history of other diseases of the nervous system and sense organs: Secondary | ICD-10-CM | POA: Insufficient documentation

## 2012-12-29 DIAGNOSIS — Z8739 Personal history of other diseases of the musculoskeletal system and connective tissue: Secondary | ICD-10-CM | POA: Insufficient documentation

## 2012-12-29 DIAGNOSIS — Z9089 Acquired absence of other organs: Secondary | ICD-10-CM | POA: Insufficient documentation

## 2012-12-29 DIAGNOSIS — Z9889 Other specified postprocedural states: Secondary | ICD-10-CM | POA: Insufficient documentation

## 2012-12-29 DIAGNOSIS — F3289 Other specified depressive episodes: Secondary | ICD-10-CM | POA: Insufficient documentation

## 2012-12-29 DIAGNOSIS — Z8719 Personal history of other diseases of the digestive system: Secondary | ICD-10-CM | POA: Insufficient documentation

## 2012-12-29 MED ORDER — MORPHINE SULFATE 10 MG/5ML PO SOLN
10.0000 mg | Freq: Once | ORAL | Status: AC
  Administered 2012-12-29: 10 mg

## 2012-12-29 MED ORDER — IOHEXOL 300 MG/ML  SOLN
50.0000 mL | Freq: Once | INTRAMUSCULAR | Status: AC | PRN
  Administered 2012-12-29: 30 mL via INTRAVENOUS

## 2012-12-29 NOTE — ED Provider Notes (Signed)
Medical screening examination/treatment/procedure(s) were performed by non-physician practitioner and as supervising physician I was immediately available for consultation/collaboration.  Shon Baton, MD 12/29/12 [phone]

## 2012-12-29 NOTE — ED Notes (Signed)
Dressing changed around tube.

## 2012-12-29 NOTE — Progress Notes (Signed)
[name]. [name], MD, [facility] Surgery  General/ Trauma Surgery  12/29/2012 1:31 PM

## 2012-12-29 NOTE — Telephone Encounter (Signed)
I did speak to [facility] concerning patient, to Sanda Linger

## 2012-12-29 NOTE — Telephone Encounter (Signed)
Spoke to Ms. Judie Petit.  He gave the patient her liquid medication through the side port and then flushed it.  He stated that both of these came back out around her skin and incision site.  He is still running her feeds and states they are running in well with no issues.  I just evaluated the patient in the [facility] about 2 hours ago.  I flushed her J-tube without any issues and manipulated to determine it was still being held in place.  I have advised the nephew to contact the home health agency to see if they can come out and check the tube as well as his technique to verify its being done correctly.  They are to contact us if there are any further issues.  She may need to come back to the ED to have IR evaluate her tube again under fluoro.  Shirin Echeverry E 3:22 PM 12/29/2012

## 2012-12-29 NOTE — ED Provider Notes (Signed)
CSN: [id number]     Arrival date & time 12/29/12  1832 History   First MD Initiated Contact with Patient 12/29/12 1935     Chief Complaint  Patient presents with  . Abdominal Pain   (Consider location/radiation/quality/duration/timing/severity/associated sxs/prior Treatment) HPI Comments: Dr. Derrell Lolling in to evaluate patient.  He, feels that the son/caregiver was trying to use the wrong on for administration of pain medication he is requested that a Gastrografin study be performed to ascertain tube placement and the patient is to followup in the clinic.  He, feels, that she needs some a wound care.  Around the stoma site as well   Patient is a 67 y.o. female presenting with abdominal pain. The history is provided by the patient and a relative.  Abdominal Pain Pain location:  Generalized Pain quality: aching   Associated symptoms: no fever     Past Medical History  Diagnosis Date  . COPD (chronic obstructive pulmonary disease)   . Pneumonia 12-2011  . GERD (gastroesophageal reflux disease)   . Headache(784.0)   . Arthritis     osteoarthritis  . Allergy   . Depression   . Neuromuscular disorder   . Osteoporosis   . Bronchitis     CURRENTLY AS OF 06/30/12 - HAS COUGH AND FINISHED ANTIBIOTIC FOR BRONCHITIS  . Fibromyalgia   . Pain     ABDOMINAL PAIN AND NAUSEA  . Pain     SOMETIMES PAIN RIGHT EAR AND NECK--STATES CAUSED BY A "LUMP" ON BACK OF EAR--USES KENALOG CREAM TOPICALLY AS NEEDED.  Marland Kitchen Gastrocutaneous fistula    Past Surgical History  Procedure Laterality Date  . Abdominal hysterectomy    . Esophagogastroduodenoscopy  04/18/2012    Procedure: ESOPHAGOGASTRODUODENOSCOPY (EGD);  Surgeon: [name], MD;  Location: [facility];  Service: Endoscopy;  Laterality: N/A;  . Eus  05/29/2012    Procedure: UPPER ENDOSCOPIC ULTRASOUND (EUS) LINEAR;  Surgeon: [name], MD;  Location: [facility];  Service: Endoscopy;  Laterality: N/A;  . Appendectomy    . Spine surgery     CERVICAL SPINE SURGERY X 2 - INCLUDING FUSION; LUMBAR SURGERY FOR RUPTURED DISC  . Eye surgery      BILATERAL CATARACT EXTRACTIONS  . Laparoscopic abdominal exploration N/A 07/02/2012    Procedure: converted to laparotomy with gastric biopsy;  Surgeon: [name]. [name], MD;  Location: [facility];  Service: General;  Laterality: N/A;  Laparoscopic Gastric Biospy attempted.   . Laparotomy N/A 07/07/2012    Procedure: EXPLORATORY LAPAROTOMY repair of gastric perforation with omental graham patch, drainage of abdominal abcess;  Surgeon: [name]. [name], MD;  Location: [facility];  Service: General;  Laterality: N/A;  . Stomach surgery  07/07/2012    Omental patch of gastric perforation  . Laparotomy N/A 07/13/2012    Procedure: EXPLORATORY LAPAROTOMY repair gastric leak;  Surgeon: Mariella Saa, MD;  Location: [facility];  Service: General;  Laterality: N/A;  . Laparotomy N/A 11/11/2012    Procedure: EXPLORATORY LAPAROTOMY;  Surgeon: [name]. [name], MD;  Location: [facility];  Service: General;  Laterality: N/A;  . Bowel resection N/A 11/11/2012    Procedure: SMALL BOWEL RESECTION;  Surgeon: [name]. [name], MD;  Location[facility];  Service: General;  Laterality: N/A;  . Minor application of wound vac N/A 11/11/2012    Procedure: APPLICATION OF WOUND VAC;  Surgeon: [name]. [name], MD;  Location: [facility];  Service: General;  Laterality: N/A;  . Jejunostomy N/A 11/11/2012    Procedure:  PLACEMENT OF FEEDING JEJUNOSTOMY TUBE;  Surgeon: [name]. [name], MD;  Location: [facility];  Service: General;  Laterality: N/A;  . Lysis of adhesion N/A 11/11/2012    Procedure: LYSIS OF ADHESION;  Surgeon: [name]. [name], MD;  Location: [facility];  Service: General;  Laterality: N/A;   Family History  Problem Relation Age of Onset  . COPD Mother   . Hypertension Maternal Grandmother    History  Substance Use Topics  . Smoking status: Former Smoker -- 0.50 packs/day for 35 years    Types: Cigarettes    Start date: 01/04/2003     Quit date: 04/17/2005  . Smokeless tobacco: Never Used  . Alcohol Use: No   OB History   Grav Para Term Preterm Abortions TAB SAB Ect Mult Living                 Review of Systems  Constitutional: Negative for fever.  Gastrointestinal: Positive for abdominal pain.  Skin: Positive for wound.  All other systems reviewed and are negative.    Allergies  Avelox; Betadine; Alendronate sodium; Aspirin; Codeine; Doxycycline; Fluconazole; Hydrocodone; Neurontin; Nsaids; Sertraline hcl; and Sulfa antibiotics  Home Medications   Current Outpatient Rx  Name  Route  Sig  Dispense  Refill  . albuterol (PROVENTIL HFA;VENTOLIN HFA) 108 (90 BASE) MCG/ACT inhaler   Inhalation   Inhale 2 puffs into the lungs every 6 (six) hours as needed for wheezing.         . diphenhydrAMINE (BENADRYL) 12.5 MG/5ML liquid   Oral   Take 12.5 mg by mouth 4 (four) times daily.          Marland Kitchen EPINEPHrine (EPIPEN) 0.3 mg/0.3 mL SOAJ   Intramuscular   Inject 0.3 mg into the muscle daily as needed (anaphylaxis).         . fentaNYL (DURAGESIC - DOSED MCG/HR) 50 MCG/HR   Transdermal   Place 2 patches (100 mcg total) onto the skin every 3 (three) days.   40 patch   0   . FLUoxetine (PROZAC) 20 MG/5ML solution   Per Tube   Place 5 mLs (20 mg total) into feeding tube daily.   120 mL   3   . morphine 10 MG/5ML solution   Per Tube   Place 5 mLs (10 mg total) into feeding tube every 3 (three) hours as needed.   250 mL   0   . nystatin-triamcinolone ointment (MYCOLOG)   Topical   Apply topically 2 (two) times daily.   30 g   0   . ondansetron (ZOFRAN-ODT) 4 MG disintegrating tablet   Oral   Take 4 mg by mouth 2 (two) times daily as needed for nausea. Takes with percocet         . warfarin (COUMADIN) 2.5 MG tablet   Per J Tube   3 tablets (7.5 mg total) by Per J Tube route daily.   90 tablet   3    BP 113/69  Pulse 103  Resp 18  SpO2 95% Physical Exam  Constitutional: She is oriented to  person, place, and time. She appears well-developed. No distress.  HENT:  Head: Normocephalic.  Eyes: Pupils are equal, round, and reactive to light.  Neck: Normal range of motion.  Cardiovascular: Normal rate and regular rhythm.   Pulmonary/Chest: Effort normal and breath sounds normal.  Abdominal: Soft. She exhibits no distension. There is no tenderness.  Patient has a jejunostomy tube on the right abdomen, with a small  amount of oozing from the site.  The skin is irritated.  Patient has been using barrier cream to no avail.  We have placed DuoDERM over the site.  Hopefully, this will help with her discomfort  Neurological: She is alert and oriented to person, place, and time.  Skin: Skin is warm. There is erythema.    ED Course  Procedures (including critical care time) Labs Review Labs Reviewed - No data to display Imaging Review Dg Abd 1 View  12/29/2012   *RADIOLOGY REPORT*  Clinical Data: Abdominal pain.  ABDOMEN - 1 VIEW  Comparison: Abdominal radiograph 12/26/2012.  Findings: A pigtail drainage catheter projecting over the right side of the abdomen.  Jejunostomy tube projecting over the left side of the abdomen.  Gas and stool are noted throughout the colon extending to the level of the distal rectum.  Unusual lucency in the right upper quadrant of the abdomen is of uncertain etiology and significance.  IMPRESSION: 1.  Unusual lucency in the right upper quadrant of the abdomen could represent pneumoperitoneum.  Further evaluation with left lateral decubitus view of the abdomen is recommended at this time. 2.  Nonobstructive bowel gas pattern. 3.  Support apparatus, as above.   Original Report Authenticated By: Trudie Reed, M.D.   Dg Abd 2 Views  12/29/2012   *RADIOLOGY REPORT*  Clinical Data: Abdominal pain.  ABDOMEN - 2 VIEW  Comparison: Abdominal radiograph 12/29/2012.  Findings: The left lateral decubitus view demonstrates no definite pneumoperitoneum.  Unusual lucency projecting  over the right upper quadrant of the abdomen on the prior examination is no longer confidently identified on this study, indicative of artifact on the prior examination.  A pigtail drainage catheter remains in position projecting over the right side of the abdomen.  Injection of the jejunostomy tube demonstrates opacification of a loop of small bowel in the left side of the abdomen, presumably jejunum.  No extravasation of contrast material is identified.  Gas and stool are noted throughout the colon extending to the level of the distal rectum.  Calcifications are noted projecting over the right side of the abdomen, likely to represent calculi within the right kidney.  IMPRESSION: 1.  No evidence of pneumoperitoneum. 2.  Tip of jejunostomy tube is within the small bowel, likely in the jejunum.   Original Report Authenticated By: Trudie Reed, M.D.    MDM   1. Encounter for wound care     Tried DuoDERM over the wound care.  Site as a barrier to placement has been verified by Gastrografin injection    Arman Filter, NP 12/29/12 2237  Arman Filter, NP 12/29/12 2248

## 2012-12-29 NOTE — Telephone Encounter (Signed)
Called Cynthia Bowen this morning and her nephew got on the phone and stated telling me what was going on with is Aunt and that she was in a lot of pain and swelling from the feeding tube, that she had him to cut it off, he did so on Saturday and she has not had any liquids or nor any little of food. And I asked why they did this and started to get loud with me on the phone and I told him that he will not get loud with me on the phone that I was calling because  Them that they  talking to Dr Ezzard Standing over the weekend and he wanted me to call them to see how she is doing, then Mr Marja Kays kept talking and stated that they was waiting on [place] to tell them what to do. I told him that she needed to be in the hospital and she needed to have the feeding tube turned back on. And I stated to Mr Jean Rosenthal that Dr Ezzard Standing stated  the same thing that she needed to be in the ER, but the nephew asked about "other options". I told him that there is no "other options" she needed to be back in the hospital and be hooked back up so she can have nutrition. I told Mr Jean Rosenthal that I wanted to speak to [place] myself. So he took the phone to her and I told her that she needed to be back in the hospital and that she needed her feeding tube turn back on, that she has been a day and half with nothing, I told her that it was not good for her to do, Dr [name] was here with me when I was talking to her and he wanted her to go to [facility]. I told her as soon as I got off the phone they needed to go now to the hospital [facility]) and she stated that she will and Mr Jean Rosenthal was in the room who had the phone on speaker and I told him that she needed to go now. And he stated that he will take her.

## 2012-12-29 NOTE — ED Notes (Signed)
Per Tresa Endo, [place] - patient is direct admit.

## 2012-12-29 NOTE — ED Notes (Signed)
Pt reports that she just had her feeding tube replaced on Saturday, but started having some pain yesterday. Thinks that the bulb may have burst on the inside. Denies any leaking. [facility] to see the patient.

## 2012-12-29 NOTE — ED Notes (Signed)
Pt c/o continued pain from abd drain; pt told to come here and [facility] to be called

## 2012-12-29 NOTE — ED Notes (Signed)
Per Tresa Endo, [place] - patient okay to go home - patient should not be charged for ED charge or be admitted.

## 2012-12-29 NOTE — ED Notes (Signed)
Talked with Dr. Antonieta Pert about pt. Informed that pt should check in and be evaluated and then to page him if needed.

## 2012-12-29 NOTE — Consult Note (Signed)
Cynthia Bowen is an 67 y.o. female.  HPI: the patient is a 67 year old female who was previously seen earlier this morning secondary to  A malfunctioning J-tube. The patient was discharged home and apparently had continued difficulty with J-tube administrating medications through it.  Secondary this at homehealthcare management she was referred back to the ER for another opinion. The patient also states she had some drainage coming from around the drainage tube on the right portion of her abdomen that was creating a tender area around the skin.  The patient states that the medication was able to be in filtrated into the port while home.  Past Medical History  Diagnosis Date  . COPD (chronic obstructive pulmonary disease)   . Pneumonia 12-2011  . GERD (gastroesophageal reflux disease)   . Headache(784.0)   . Arthritis     osteoarthritis  . Allergy   . Depression   . Neuromuscular disorder   . Osteoporosis   . Bronchitis     CURRENTLY AS OF 06/30/12 - HAS COUGH AND FINISHED ANTIBIOTIC FOR BRONCHITIS  . Fibromyalgia   . Pain     ABDOMINAL PAIN AND NAUSEA  . Pain     SOMETIMES PAIN RIGHT EAR AND NECK--STATES CAUSED BY A "LUMP" ON BACK OF EAR--USES KENALOG CREAM TOPICALLY AS NEEDED.  Marland Kitchen Gastrocutaneous fistula     Past Surgical History  Procedure Laterality Date  . Abdominal hysterectomy    . Esophagogastroduodenoscopy  04/18/2012    Procedure: ESOPHAGOGASTRODUODENOSCOPY (EGD);  Surgeon: [name], MD;  Location: [facility];  Service: Endoscopy;  Laterality: N/A;  . Eus  05/29/2012    Procedure: UPPER ENDOSCOPIC ULTRASOUND (EUS) LINEAR;  Surgeon: [name], MD;  Location: [facility];  Service: Endoscopy;  Laterality: N/A;  . Appendectomy    . Spine surgery      CERVICAL SPINE SURGERY X 2 - INCLUDING FUSION; LUMBAR SURGERY FOR RUPTURED DISC  . Eye surgery      BILATERAL CATARACT EXTRACTIONS  . Laparoscopic abdominal exploration N/A 07/02/2012    Procedure: converted to  laparotomy with gastric biopsy;  Surgeon: [name]. [name], MD;  Location: [facility];  Service: General;  Laterality: N/A;  Laparoscopic Gastric Biospy attempted.   . Laparotomy N/A 07/07/2012    Procedure: EXPLORATORY LAPAROTOMY repair of gastric perforation with omental graham patch, drainage of abdominal abcess;  Surgeon: [name]. [name], MD;  Location: [facility];  Service: General;  Laterality: N/A;  . Stomach surgery  07/07/2012    Omental patch of gastric perforation  . Laparotomy N/A 07/13/2012    Procedure: EXPLORATORY LAPAROTOMY repair gastric leak;  Surgeon: Mariella Saa, MD;  Location: [facility];  Service: General;  Laterality: N/A;  . Laparotomy N/A 11/11/2012    Procedure: EXPLORATORY LAPAROTOMY;  Surgeon: [name]. [name], MD;  Location: [facility];  Service: General;  Laterality: N/A;  . Bowel resection N/A 11/11/2012    Procedure: SMALL BOWEL RESECTION;  Surgeon: [name]. [name], MD;  Location: [facility];  Service: General;  Laterality: N/A;  . Minor application of wound vac N/A 11/11/2012    Procedure: APPLICATION OF WOUND VAC;  Surgeon: [name]. [name], MD;  Location: [facility];  Service: General;  Laterality: N/A;  . Jejunostomy N/A 11/11/2012    Procedure: PLACEMENT OF FEEDING JEJUNOSTOMY TUBE;  Surgeon: [name]. [name], MD;  Location: [facility];  Service: General;  Laterality: N/A;  . Lysis of adhesion N/A 11/11/2012    Procedure: LYSIS OF ADHESION;  Surgeon: Molli Hazard  Kerman Passey, MD;  Location: [facility];  Service: General;  Laterality: N/A;    Family History  Problem Relation Age of Onset  . COPD Mother   . Hypertension Maternal Grandmother     Social History:  reports that she quit smoking about 7 years ago. Her smoking use included Cigarettes. She started smoking about 9 years ago. She has a 17.5 pack-year smoking history. She has never used smokeless tobacco. She reports that she does not drink alcohol or use illicit drugs.  Allergies:  Allergies  Allergen Reactions  . Avelox [Moxifloxacin  Hcl In Nacl] Nausea And Vomiting  . Betadine [Povidone Iodine] Itching and Rash  . Alendronate Sodium Nausea And Vomiting  . Aspirin Nausea Only  . Codeine Nausea And Vomiting  . Doxycycline     Pt doesn't remember reaction  . Fluconazole     Pt doesn't remember reaction  . Hydrocodone Nausea And Vomiting    GI distress  . Neurontin [Gabapentin] Other (See Comments)    Mood changes   . Nsaids Other (See Comments)    Severe gastritis & perforation - avoid NSAIDs when possible  . Sertraline Hcl Other (See Comments)    Hallucinations   . Sulfa Antibiotics Rash    Medications: I have reviewed the patient's current medications.  No results found for this or any previous visit (from the past 48 hour(s)).  No results found.  Review of Systems  Constitutional: Negative.   HENT: Negative.   Respiratory: Negative.   Cardiovascular: Negative.   Musculoskeletal: Negative.   Neurological: Negative.    Blood pressure 113/69, pulse 103, temperature 0 F (-17.8 C), resp. rate 18, SpO2 95.00%. Physical Exam  Constitutional: She is oriented to person, place, and time. She appears well-developed and well-nourished.  HENT:  Head: Normocephalic and atraumatic.  Eyes: Conjunctivae and EOM are normal. Pupils are equal, round, and reactive to light.  Neck: Normal range of motion. Neck supple.  Cardiovascular: Normal rate, regular rhythm and normal heart sounds.   Respiratory: Effort normal and breath sounds normal.  GI: Soft. Bowel sounds are normal.    Musculoskeletal: Normal range of motion.  Neurological: She is alert and oriented to person, place, and time.  Skin: Skin is warm and dry.    Assessment/Plan: 67 year old female with a malfunctioning J-tube.  1. Patient will need a Gastrografin study to confirm placement of the J-tube in the jejunum. 2. I discussed with the patient and her family that only the main port is to be used for J-tube feeds as well as medications. 3. Once  the J-tube is confirmed at the jejunum she can be discharged respiratory standpoint. The patient has followup at [facility] later this week.  Marigene Ehlers., Tavone Caesar 12/29/2012, 8:10 PM

## 2012-12-29 NOTE — Progress Notes (Signed)
Patient ID: Cynthia Bowen, female   DOB: [birth year], 67 y.o.   MRN: [id number] Patient came in today because of a concern about her J-tube becoming malpositioned.  She had it replaced by IR on Saturday.  Currently, the J-tube is in position and flushes well without any issues.  Her nausea has resolved and she has no other new complaints.  She is stable for dc back home.  She has follow up with [facility] on Wednesday and Dr. [name] next week.  The rest of her abdomen is stable.  Misael Mcgaha E 1:10 PM 12/29/2012

## 2012-12-29 NOTE — ED Provider Notes (Signed)
7:45 PM  Pt brought back to pod E to be evaluated by Dr. Derrell Lolling with general surgery. Patient has a history of multiple abdominal surgeries and has a feeding tube that was replaced on Saturday. Patient is having increasing pain. When I went to evaluate the patient, she was in no apparent distress, hemodynamically stable. Dr. Derrell Lolling at bedside prior to my full examination.  Surgery to dispo patient.  Layla Maw Kaliann Coryell, DO 12/29/12 1946

## 2012-12-29 NOTE — Telephone Encounter (Signed)
Nephew is asking to speak to Sharon, Ms Neumeier is having problems with her port per [facility] .advised him I would page On call MD to inform him of transport

## 2012-12-31 ENCOUNTER — Encounter (HOSPITAL_COMMUNITY): Payer: Self-pay | Admitting: Emergency Medicine

## 2012-12-31 ENCOUNTER — Telehealth: Payer: Self-pay

## 2012-12-31 ENCOUNTER — Emergency Department (HOSPITAL_COMMUNITY)
Admission: EM | Admit: 2012-12-31 | Discharge: 2012-12-31 | Disposition: A | Discharge status: Home / Self Care | Payer: Medicare Other | Attending: Emergency Medicine | Admitting: Emergency Medicine

## 2012-12-31 ENCOUNTER — Emergency Department (HOSPITAL_COMMUNITY): Payer: Medicare Other

## 2012-12-31 DIAGNOSIS — K9423 Gastrostomy malfunction: Secondary | ICD-10-CM | POA: Insufficient documentation

## 2012-12-31 DIAGNOSIS — Y833 Surgical operation with formation of external stoma as the cause of abnormal reaction of the patient, or of later complication, without mention of misadventure at the time of the procedure: Secondary | ICD-10-CM | POA: Insufficient documentation

## 2012-12-31 DIAGNOSIS — J449 Chronic obstructive pulmonary disease, unspecified: Secondary | ICD-10-CM | POA: Insufficient documentation

## 2012-12-31 DIAGNOSIS — Z79899 Other long term (current) drug therapy: Secondary | ICD-10-CM | POA: Insufficient documentation

## 2012-12-31 DIAGNOSIS — Z8669 Personal history of other diseases of the nervous system and sense organs: Secondary | ICD-10-CM | POA: Insufficient documentation

## 2012-12-31 DIAGNOSIS — Z8739 Personal history of other diseases of the musculoskeletal system and connective tissue: Secondary | ICD-10-CM | POA: Insufficient documentation

## 2012-12-31 DIAGNOSIS — Z87891 Personal history of nicotine dependence: Secondary | ICD-10-CM | POA: Insufficient documentation

## 2012-12-31 DIAGNOSIS — Z7901 Long term (current) use of anticoagulants: Secondary | ICD-10-CM | POA: Insufficient documentation

## 2012-12-31 DIAGNOSIS — Z8701 Personal history of pneumonia (recurrent): Secondary | ICD-10-CM | POA: Insufficient documentation

## 2012-12-31 DIAGNOSIS — J4489 Other specified chronic obstructive pulmonary disease: Secondary | ICD-10-CM | POA: Insufficient documentation

## 2012-12-31 DIAGNOSIS — F3289 Other specified depressive episodes: Secondary | ICD-10-CM | POA: Insufficient documentation

## 2012-12-31 DIAGNOSIS — Z8719 Personal history of other diseases of the digestive system: Secondary | ICD-10-CM | POA: Insufficient documentation

## 2012-12-31 DIAGNOSIS — T85528A Displacement of other gastrointestinal prosthetic devices, implants and grafts, initial encounter: Secondary | ICD-10-CM

## 2012-12-31 DIAGNOSIS — F329 Major depressive disorder, single episode, unspecified: Secondary | ICD-10-CM | POA: Insufficient documentation

## 2012-12-31 MED ORDER — OXYCODONE-ACETAMINOPHEN 5-325 MG PO TABS
1.0000 | ORAL_TABLET | Freq: Once | ORAL | Status: AC
  Administered 2012-12-31: 2 via ORAL
  Filled 2012-12-31: qty 2

## 2012-12-31 MED ORDER — IOHEXOL 300 MG/ML  SOLN
50.0000 mL | Freq: Once | INTRAMUSCULAR | Status: AC | PRN
  Administered 2012-12-31: 15 mL via INTRAVENOUS

## 2012-12-31 MED ORDER — CHLORHEXIDINE GLUCONATE 4 % EX LIQD
CUTANEOUS | Status: AC
  Filled 2012-12-31: qty 30

## 2012-12-31 NOTE — ED Notes (Signed)
Pt returned from IR. Pt ambulatory to restroom.

## 2012-12-31 NOTE — ED Notes (Signed)
Trying to find correct tube size, MD doesn't want to use the one we have in stock. Sunita, RN is placing call to radiology, where pt has prior tube placed.

## 2012-12-31 NOTE — ED Provider Notes (Signed)
CSN: [id number]     Arrival date & time 12/31/12  7829 History   First MD Initiated Contact with Patient 12/31/12 0827     Chief Complaint  Patient presents with  . GI Problem   (Consider location/radiation/quality/duration/timing/severity/associated sxs/prior Treatment) HPI Comments: Cynthia Bowen is a 67 y.o. Female who complains of her jejunostomy tube falling out, this morning, after she received medications, through its port. She was treated 4 days ago with replacement of a new tube, by interventional radiology. She is due to see a Careers adviser at [facility], this afternoon for evaluation of gastric perforation. She has a drain in the right abdomen, following recent abdominal surgery. She denies fever, chills, nausea, vomiting, weakness, or dizziness. She has been using her usual medications. There are no other known modifying factors.     Patient is a 67 y.o. female presenting with GI illness. The history is provided by the patient.  GI Problem    Past Medical History  Diagnosis Date  . COPD (chronic obstructive pulmonary disease)   . Pneumonia 12-2011  . GERD (gastroesophageal reflux disease)   . Headache(784.0)   . Arthritis     osteoarthritis  . Allergy   . Depression   . Neuromuscular disorder   . Osteoporosis   . Bronchitis     CURRENTLY AS OF 06/30/12 - HAS COUGH AND FINISHED ANTIBIOTIC FOR BRONCHITIS  . Fibromyalgia   . Pain     ABDOMINAL PAIN AND NAUSEA  . Pain     SOMETIMES PAIN RIGHT EAR AND NECK--STATES CAUSED BY A "LUMP" ON BACK OF EAR--USES KENALOG CREAM TOPICALLY AS NEEDED.  Marland Kitchen Gastrocutaneous fistula    Past Surgical History  Procedure Laterality Date  . Abdominal hysterectomy    . Esophagogastroduodenoscopy  04/18/2012    Procedure: ESOPHAGOGASTRODUODENOSCOPY (EGD);  Surgeon: [name], MD;  Location: [facility];  Service: Endoscopy;  Laterality: N/A;  . Eus  05/29/2012    Procedure: UPPER ENDOSCOPIC ULTRASOUND (EUS) LINEAR;  Surgeon:  [name], MD;  Location: [facility];  Service: Endoscopy;  Laterality: N/A;  . Appendectomy    . Spine surgery      CERVICAL SPINE SURGERY X 2 - INCLUDING FUSION; LUMBAR SURGERY FOR RUPTURED DISC  . Eye surgery      BILATERAL CATARACT EXTRACTIONS  . Laparoscopic abdominal exploration N/A 07/02/2012    Procedure: converted to laparotomy with gastric biopsy;  Surgeon: [name]. [name], MD;  Location: [facility];  Service: General;  Laterality: N/A;  Laparoscopic Gastric Biospy attempted.   . Laparotomy N/A 07/07/2012    Procedure: EXPLORATORY LAPAROTOMY repair of gastric perforation with omental graham patch, drainage of abdominal abcess;  Surgeon: [name]. [name], MD;  Location: [facility];  Service: General;  Laterality: N/A;  . Stomach surgery  07/07/2012    Omental patch of gastric perforation  . Laparotomy N/A 07/13/2012    Procedure: EXPLORATORY LAPAROTOMY repair gastric leak;  Surgeon: Mariella Saa, MD;  Location: [facility];  Service: General;  Laterality: N/A;  . Laparotomy N/A 11/11/2012    Procedure: EXPLORATORY LAPAROTOMY;  Surgeon: [name]. [name], MD;  Location: [facility];  Service: General;  Laterality: N/A;  . Bowel resection N/A 11/11/2012    Procedure: SMALL BOWEL RESECTION;  Surgeon: [name]. [name], MD;  Location: [facility];  Service: General;  Laterality: N/A;  . Minor application of wound vac N/A 11/11/2012    Procedure: APPLICATION OF WOUND VAC;  Surgeon: [name]. [name], MD;  Location:  [facility];  Service: General;  Laterality: N/A;  . Jejunostomy N/A 11/11/2012    Procedure: PLACEMENT OF FEEDING JEJUNOSTOMY TUBE;  Surgeon: [name]. [name], MD;  Location: [facility];  Service: General;  Laterality: N/A;  . Lysis of adhesion N/A 11/11/2012    Procedure: LYSIS OF ADHESION;  Surgeon: [name]. [name], MD;  Location: [facility];  Service: General;  Laterality: N/A;   Family History  Problem Relation Age of Onset  . COPD Mother   . Hypertension Maternal Grandmother    History  Substance  Use Topics  . Smoking status: Former Smoker -- 0.50 packs/day for 35 years    Types: Cigarettes    Start date: 01/04/2003    Quit date: 04/17/2005  . Smokeless tobacco: Never Used  . Alcohol Use: No   OB History   Grav Para Term Preterm Abortions TAB SAB Ect Mult Living                 Review of Systems  All other systems reviewed and are negative.    Allergies  Avelox; Betadine; Alendronate sodium; Aspirin; Codeine; Doxycycline; Fluconazole; Hydrocodone; Neurontin; Nsaids; Sertraline hcl; and Sulfa antibiotics  Home Medications   Current Outpatient Rx  Name  Route  Sig  Dispense  Refill  . diphenhydrAMINE (BENADRYL) 12.5 MG/5ML liquid   Oral   Take 12.5 mg by mouth 4 (four) times daily as needed for itching.          . fentaNYL (DURAGESIC - DOSED MCG/HR) 50 MCG/HR   Transdermal   Place 2 patches onto the skin every 3 (three) days.         Marland Kitchen FLUoxetine (PROZAC) 20 MG/5ML solution   Per Tube   Place 5 mLs (20 mg total) into feeding tube daily.   120 mL   3   . morphine 10 MG/5ML solution   Tube   Give 10 mg by tube every 3 (three) hours as needed for pain.         Marland Kitchen nystatin-triamcinolone ointment (MYCOLOG)   Topical   Apply 1 application topically 2 (two) times daily.         . ondansetron (ZOFRAN-ODT) 4 MG disintegrating tablet   Oral   Take 4 mg by mouth 2 (two) times daily as needed for nausea.          Marland Kitchen warfarin (COUMADIN) 7.5 MG tablet   Tube   Give 7.5 mg by tube daily.         Marland Kitchen albuterol (PROVENTIL HFA;VENTOLIN HFA) 108 (90 BASE) MCG/ACT inhaler   Inhalation   Inhale 2 puffs into the lungs every 6 (six) hours as needed for wheezing.         Marland Kitchen EPINEPHrine (EPIPEN) 0.3 mg/0.3 mL SOAJ   Intramuscular   Inject 0.3 mg into the muscle daily as needed (anaphylaxis).          BP 134/83  Pulse 73  Temp(Src) 97.8 F (36.6 C) (Oral)  Resp 18  Ht 5\' 5"  (1.651 m)  Wt 92 lb 8 oz (41.958 kg)  BMI 15.39 kg/m2  SpO2 97% Physical Exam   Nursing note and vitals reviewed. Constitutional: She is oriented to person, place, and time. She appears well-developed and well-nourished.  HENT:  Head: Normocephalic and atraumatic.  Eyes: Conjunctivae and EOM are normal. Pupils are equal, round, and reactive to light.  Neck: Normal range of motion and phonation normal. Neck supple.  Cardiovascular: Normal rate, regular rhythm and intact distal pulses.  Pulmonary/Chest: Effort normal and breath sounds normal. She exhibits no tenderness.  Abdominal: Soft. She exhibits no distension. There is no tenderness. There is no guarding.  J-tube site, left upper quadrant, with small amount of purulent drainage. No associated redness or bleeding at this site. Right upper quadrant has a drainage tube, intra-abdominal, and moderate, redness and induration around the wound. Mid lower abdomen has a open wound that is packed with gauze and is not tender or having significant erythema or drainage.  Musculoskeletal: Normal range of motion.  Neurological: She is alert and oriented to person, place, and time. She has normal strength. She exhibits normal muscle tone.  Skin: Skin is warm and dry.  Psychiatric: She has a normal mood and affect. Her behavior is normal. Judgment and thought content normal.    ED Course  Procedures (including critical care time)  Patient has a recent placement of jejunostomy tube, at IR, following temporizing Foley placed 12/26/12. It was originally placed 11/11/12, at the time of operative repair.  9:45 AM-Consult complete with General Surgery. Patient case explained and discussed. She recommends, IR, place, the jejunostomy tube. Based on my description of the case, she feels that the patient is otherwise at her baseline, and that she can go to her appointment today, at the [facility] in [place] for further evaluation and treatment. Call ended at 1050  1051- Case discussed with interventional radiologist, who  will accept the patient and place the jejunostomy tube.   Labs Review Labs Reviewed - No data to display Imaging Review Dg Abd 1 View  12/29/2012   *RADIOLOGY REPORT*  Clinical Data: Abdominal pain.  ABDOMEN - 1 VIEW  Comparison: Abdominal radiograph 12/26/2012.  Findings: A pigtail drainage catheter projecting over the right side of the abdomen.  Jejunostomy tube projecting over the left side of the abdomen.  Gas and stool are noted throughout the colon extending to the level of the distal rectum.  Unusual lucency in the right upper quadrant of the abdomen is of uncertain etiology and significance.  IMPRESSION: 1.  Unusual lucency in the right upper quadrant of the abdomen could represent pneumoperitoneum.  Further evaluation with left lateral decubitus view of the abdomen is recommended at this time. 2.  Nonobstructive bowel gas pattern. 3.  Support apparatus, as above.   Original Report Authenticated By: Trudie Reed, M.D.   Ir Replc Duoden/jejuno Tube Percut W/fluoro  12/31/2012   *RADIOLOGY REPORT*  Indication: Recurrent dislodgement of feeding jejunostomy tube  FLUROSCOPIC GUIDED REPLACEMENT OF JEJUNOSTOMY TUBE  Comparison: Fluoroscopic guided jejunostomy tube exchange - 12/27/2012; 12/12/2012  Medications: None  Contrast: 15 ml Omnipaque-300, administered enterically  Fluoroscopy Time: 18 seconds.  Complications: None immediate  Technique / Findings:  Informed written consent was obtained from the patient after a discussion of the risks, benefits and alternatives to treatment. Questions regarding the procedure were encouraged and answered.  A timeout was performed prior to the initiation of the procedure.  The site of prior jejunostomy tube was prepped and draped in the usual sterile fashion, and a sterile drape was applied covering the operative field.  Maximum barrier sterile technique with sterile gowns and gloves were used for the procedure.  A timeout was performed prior to the initiation of  the procedure.  The ostomy of the previously placed jejunostomy tube was cannulated with a Christmas tree adapter and contrast injection was performed delineating the track of the prior jejunostomy tube.  The tract was still successfully cannulated and traversed with the  use of a Kumpe catheter and guide wire.  Contrast injection confirmed appropriate intraluminal positioning.  Approximately 10 cm of the distal end of the catheter was cut as has been done previously.  Under intermittent fluoroscopic guidance, the catheter was exchanged for a new 18-French jejunostomy tube.  The retention balloon was inflated with a mixture of approximately 8 ml of saline and dilute contrast and the disc was cinched.  Contrast was injected and several spot fluoroscopic images were obtained confirming intraluminal postioning.  The disc retention device was secured with 2 interrupted 0 Prolene sutures.  Additionally the disc bumper was encircled/tied with an 0 silk suture.  The patient tolerated the procedure well without immediate postprocedural complication.  Impression:  Successful fluoroscopic guided placement of a new 18-French balloon retention jejunostomy tube with retention sutures placed at the external disc for additional support.  The new jejunostomy tube is ready for immediate use.   Original Report Authenticated By: Tacey Ruiz, MD   Dg Abd 2 Views  12/29/2012   *RADIOLOGY REPORT*  Clinical Data: Abdominal pain.  ABDOMEN - 2 VIEW  Comparison: Abdominal radiograph 12/29/2012.  Findings: The left lateral decubitus view demonstrates no definite pneumoperitoneum.  Unusual lucency projecting over the right upper quadrant of the abdomen on the prior examination is no longer confidently identified on this study, indicative of artifact on the prior examination.  A pigtail drainage catheter remains in position projecting over the right side of the abdomen.  Injection of the jejunostomy tube demonstrates opacification of a loop  of small bowel in the left side of the abdomen, presumably jejunum.  No extravasation of contrast material is identified.  Gas and stool are noted throughout the colon extending to the level of the distal rectum.  Calcifications are noted projecting over the right side of the abdomen, likely to represent calculi within the right kidney.  IMPRESSION: 1.  No evidence of pneumoperitoneum. 2.  Tip of jejunostomy tube is within the small bowel, likely in the jejunum.   Original Report Authenticated By: Trudie Reed, M.D.    MDM   1. Jejunostomy tube fell out    Her current jejunostomy appliance problem. No evidence for acute intra-abdominal infection or cellulitis of the chest wall. A feeding tube has been successfully replaced by interventional radiology   Nursing Notes Reviewed/ Care Coordinated, and agree without changes. Applicable Imaging Reviewed.  Interpretation of Laboratory Data incorporated into ED treatment   Plan: Home Medications- usual; Home Treatments and Observation- watch for progressive symptoms; return here if the recommended treatment, does not improve the symptoms; Recommended follow up- follow up with surgery consultation, today, as scheduled and PCP, as needed     Flint Melter, MD 12/31/12 [phone]

## 2012-12-31 NOTE — Telephone Encounter (Signed)
Are you in charge of her INR and blood thinners?

## 2012-12-31 NOTE — ED Notes (Signed)
Received call from IR that pt is requesting PO pain medication. Pt states is not allergic to Percocet and prefers this medication. Medication taken to pt and taken without issue. Pt calm and interactive at IR.

## 2012-12-31 NOTE — ED Notes (Signed)
Pt and family comfortable with d/c and f/u instructions. No prescritpions.

## 2012-12-31 NOTE — ED Notes (Signed)
Per ems - pt woke up this morning to use the restroom and realized her feeding tube was dislodged, reports that this has happened approx 4 times in the last week, pt c/o diffuse abd pain rating at 8/10. Pt had the tube placed d/t hernia and a hole in her stomach. BP 114/68 HR 80 RR 16.

## 2012-12-31 NOTE — Telephone Encounter (Signed)
Spoke with Selena Batten from [facility]. She stated that pt doesn't have anyone following her on her INR levels. She stated that she would be glad to check the levels weekly but would like to call us directly to give the results over the phone for faster response time to adjusting meds. Dr. Merla Riches is ok with this? This will start on Thursdays. I informed Selena Batten that either myself or Amy would take the calls and enter the INR into the pts chart weekly and forward this to Dr. Merla Riches.

## 2012-12-31 NOTE — Procedures (Signed)
Successful fluoroscopic guided replacement of 18 Fr jejunostomy tube.  No immediate post procedural complications.  The feeding tube is ready for immediate use. 

## 2012-12-31 NOTE — Telephone Encounter (Signed)
[name] from [facility] would like to talk to Dr. Merla Riches about and INR on this patient.  Best [phone]

## 2013-01-01 ENCOUNTER — Telehealth: Payer: Self-pay | Admitting: Radiology

## 2013-01-01 NOTE — Telephone Encounter (Signed)
Call from [facility] pts INR is 4.5 was 1.9 on the 5th. She is on 2/5 mg of coumadin please advise.

## 2013-01-01 NOTE — Telephone Encounter (Signed)
Called [facility] nurse [phone] [name] and left message for her. Dr Merla Riches wants her to hold coumadin today and Friday and resume 2.5 mg dose on Saturday recheck INR on Tuesday. Called patient as well, but nephew would not let me speak to patient. [name] called me back, and states she will call and advise patient, and the nephew, since he knows her.

## 2013-01-02 ENCOUNTER — Telehealth: Payer: Self-pay | Admitting: Radiology

## 2013-01-02 ENCOUNTER — Encounter: Payer: Self-pay | Admitting: Internal Medicine

## 2013-01-02 NOTE — Telephone Encounter (Signed)
Dr Merla Riches has ordered patients coumadin to be held on 9/11 and 9/12 she will resume 2.5mg  dose on 9/13 and have her INR rechecked on Tues 9/16. Nurse Selena Batten at [facility] is aware, and patient has been advised by nurse Selena Batten. I have advised Donna Christen at 104 in case she gets any calls regarding this.

## 2013-01-06 ENCOUNTER — Telehealth: Payer: Self-pay | Admitting: Radiology

## 2013-01-06 ENCOUNTER — Encounter (INDEPENDENT_AMBULATORY_CARE_PROVIDER_SITE_OTHER): Payer: Self-pay

## 2013-01-06 ENCOUNTER — Ambulatory Visit (INDEPENDENT_AMBULATORY_CARE_PROVIDER_SITE_OTHER): Payer: Medicare Other | Admitting: Surgery

## 2013-01-06 ENCOUNTER — Encounter (INDEPENDENT_AMBULATORY_CARE_PROVIDER_SITE_OTHER): Payer: Self-pay | Admitting: Surgery

## 2013-01-06 VITALS — BP 116/70 | HR 72 | Temp 98.6°F | Resp 14 | Ht 65.0 in | Wt 101.6 lb

## 2013-01-06 DIAGNOSIS — K316 Fistula of stomach and duodenum: Secondary | ICD-10-CM

## 2013-01-06 DIAGNOSIS — E43 Unspecified severe protein-calorie malnutrition: Secondary | ICD-10-CM

## 2013-01-06 NOTE — Progress Notes (Signed)
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 She underwent laparotomy with primary anastomosis. She had a jejunostomy tube placed at that time. She was recently discharged with tube feeds via jejunostomy tube. Her midline incision was opened and is being treated with dressing changes. She continues her right upper quadrant drain for her gastrocutaneous fistula.  Overall she is feeling better. She is putting on some weight. Her skin is better protected with the barrier around the right upper quadrant drain. She is having decreasing pain. She was seen by Dr. Sima Matas at [facility] For a consultation. He ordered a CT scan that was performed last week. The results are not yet available. He is considering an EGD to evaluate the inside of her stomach.  Her jejunostomy is now functioning well. She did have some problems with the balloon and had to have her J-tube replaced twice. She is tolerating her tube feeds. She is having daily bowel movements. Her Coumadin level was checked today. We're awaiting the results of that. She is administering her Coumadin via J-tube.  Her midline incision is almost completely healed. Her right upper quadrant drain site has some mild induration around it but this seems much improved from my last examination. Her jejunostomy tube site is clean and the balloon seems to be intact. No significant abdominal tenderness. Her weight is up to 101.6 which is the greatest that it has been in the last 6 months.  She should followup again with Dr. Carolynn Sayers. Continue dry dressings to the midline incision until completely healed. Continue with the barrier around her drain site to protect the skin. Continue jejunostomy tube feeds. We will recheck her in 3 weeks.  [name]. [name], MD, [facility]  General/ Trauma Surgery  01/06/2013 2:13 PM

## 2013-01-06 NOTE — Telephone Encounter (Signed)
1.2 INR today, discussed with Dr Merla Riches, she is to stay at current 2.5mg  dose and have INR rechecked in one week. [facility] nurse [name] advised.

## 2013-01-08 ENCOUNTER — Telehealth (INDEPENDENT_AMBULATORY_CARE_PROVIDER_SITE_OTHER): Payer: Self-pay

## 2013-01-08 NOTE — Telephone Encounter (Signed)
Nurse from [facility] calling to inquire if Dr. [name] had authorized a change in j tube feeding orders for this patient.  Pt's caregiver told her that Dr. [name] had changed orders at the hospital from 55mL x 24 hrs to 95mL x 24 hrs.  Dr. [name] was paged and stated he "did not" authorize this change.  His instructions were no changes to be made at this time.

## 2013-01-09 ENCOUNTER — Ambulatory Visit (INDEPENDENT_AMBULATORY_CARE_PROVIDER_SITE_OTHER): Payer: Medicare Other | Admitting: Surgery

## 2013-01-09 ENCOUNTER — Encounter (INDEPENDENT_AMBULATORY_CARE_PROVIDER_SITE_OTHER): Payer: Self-pay | Admitting: Surgery

## 2013-01-09 VITALS — BP 92/60 | HR 72 | Temp 97.8°F | Resp 14 | Ht 65.0 in | Wt 98.8 lb

## 2013-01-09 DIAGNOSIS — Z934 Other artificial openings of gastrointestinal tract status: Secondary | ICD-10-CM

## 2013-01-09 NOTE — Progress Notes (Signed)
Her jejunostomy tube continues to function well but both sutures are pulled to the skin. We prepped with Betadine and anesthetized with 1% lidocaine. I placed 2 sutures of 2-0 nylon. The patient tolerated the procedure well. She has an appointment next week with Dr. Lorin Picket.  Follow-up as scheduled.  [name]. [name], MD, [facility] Surgery  General/ Trauma Surgery  01/09/2013 11:16 AM

## 2013-01-12 ENCOUNTER — Encounter: Payer: Self-pay | Admitting: Internal Medicine

## 2013-01-12 ENCOUNTER — Ambulatory Visit (INDEPENDENT_AMBULATORY_CARE_PROVIDER_SITE_OTHER)
Admission: RE | Admit: 2013-01-12 | Discharge: 2013-01-12 | Disposition: A | Discharge status: Home / Self Care | Payer: Medicare Other | Source: Ambulatory Visit | Attending: Internal Medicine | Admitting: Internal Medicine

## 2013-01-12 ENCOUNTER — Ambulatory Visit (INDEPENDENT_AMBULATORY_CARE_PROVIDER_SITE_OTHER): Payer: Medicare Other | Admitting: Internal Medicine

## 2013-01-12 VITALS — BP 100/60 | HR 106 | Temp 98.0°F | Ht 65.0 in | Wt 98.6 lb

## 2013-01-12 DIAGNOSIS — J449 Chronic obstructive pulmonary disease, unspecified: Secondary | ICD-10-CM

## 2013-01-12 DIAGNOSIS — I2699 Other pulmonary embolism without acute cor pulmonale: Secondary | ICD-10-CM

## 2013-01-12 MED ORDER — PANTOPRAZOLE SODIUM 40 MG PO PACK: 40.0000 mg | PACK | Freq: Every day | ORAL | Status: DC

## 2013-01-12 MED ORDER — RANITIDINE HCL 150 MG/10ML PO SYRP: ORAL_SOLUTION | ORAL | Status: DC

## 2013-01-12 NOTE — Assessment & Plan Note (Signed)
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 No  definite evidence of new thrombus is noted. Mild left pleural  effusion is noted with adjacent subsegmental atelectasis.   She is already on coumadin with nothing clinically to indicate a change is needed.   The normal rec is for 6 months min of coumadin and then recheck a venous doppler (can't see where one was done at the dx but if was neg then does not need to be repeated  Comment.  This is a very small peripheral defect that is of no pathophysiologic significance x it puts her at risk of future clots.

## 2013-01-12 NOTE — Patient Instructions (Addendum)
Pantoprazole (protonix) 40 mg   Take 30-60 min before first meal of the day and Zantac 150 mg (10 ml) one bedtime until return to office for a month and if cough no better need to return here for lung function tests as previously recommended  Please remember to go to the  x-ray department downstairs for your tests - we will call you with the results when they are available.

## 2013-01-12 NOTE — Progress Notes (Signed)
Quick Note:  Spoke with pt and notified of results per Dr. Wert. Pt verbalized understanding and denied any questions.  ______ 

## 2013-01-12 NOTE — Progress Notes (Signed)
Subjective:    Patient ID: Cynthia Bowen, female    DOB: 1945-12-13  MRN: [id number]    Brief patient profile:  17 yobf quit smoking 2006 with cough that completely and good ex tolerance then dx pna Sept 2013 and referred 01/29/2012 to pulmonary clinic by Dr Merla Riches for persistent cough/sob.  History of Present Illness  01/29/2012 1st pulmonary eval cc not limited by breathing then developed outpt pneumonia and since then doe x [place] and cough is still productive white, assoc with  fatigue. Feels throat burning and sensation of retained secretions but actual sputum production minimal and some better p dulera.  rec Try protonix 40 mg  Take 30-60 min before first meal of the day and Pepcid 20 mg one bedtime until cough is completely gone for at least a week without the need for cough suppression like delsym  GERD diet  F/u pft's > did not return    01/12/2013 f/u ov/Wert re: downhill since March 2014 stomach surgery  (see Dr Fatima Sanger ov 01/06/13 for outline of surg care/ complications)  Chief Complaint  Patient presents with  . Follow-up    Pt last seen in Oct 2013. She states was referred back to [place] per Urgent Care for "pulmonary care".  She denies any dyspnea, but does c/o cough x 2 wks- prod with minimal clear sputum. She relates this cough to allergies.     Ever since first surgery some cough returned worse since July 2014 but tends to be worse around 8 pm nightly and goes away until 10 am seems worse p that, no longer on acid suppression - ? Better p inhaler ?   Now strictly on peg feedings  No obvious day to day or daytime variabilty or assoc chronic cough or cp or chest tightness, subjective wheeze overt sinus or hb symptoms. No unusual exp hx or h/o childhood pna/ asthma or knowledge of premature birth.  Sleeping ok without nocturnal  or early am exacerbation  of respiratory  c/o's or need for noct saba. Also denies any obvious fluctuation of symptoms with weather or environmental  changes or other aggravating or alleviating factors except as outlined above   Current Medications, Allergies, Complete Past Medical History, Past Surgical History, Family History, and Social History were reviewed in [employer] record.  ROS  The following are not active complaints unless bolded sore throat, dysphagia, dental problems, itching, sneezing,  nasal congestion or excess/ purulent secretions, ear ache,   fever, chills, sweats, unintended wt loss, pleuritic or exertional cp, hemoptysis,  orthopnea pnd or leg swelling, presyncope, palpitations, heartburn, abdominal pain, anorexia, nausea, vomiting, diarrhea  or change in bowel or urinary habits, change in stools or urine, dysuria,hematuria,  rash, arthralgias, visual complaints, headache, numbness weakness or ataxia or problems with walking or coordination,  change in mood/affect or memory.                Objective:   Physical Exam  Wt Readings from Last 3 Encounters:  01/12/13 98 lb 9.6 oz (44.725 kg)  01/09/13 98 lb 12.8 oz (44.815 kg)  01/06/13 101 lb 9.6 oz (46.085 kg)        HEENT mild turbinate edema.  Oropharynx no thrush or excess pnd or cobblestoning.  No JVD or cervical adenopathy. Mild accessory muscle hypertrophy. Trachea midline, nl thryroid. Chest was hyperinflated by percussion with diminished breath sounds and moderate increased exp time without wheeze. Hoover sign positive at very end of inspiration. Regular rate and  rhythm without murmur gallop or rub or increase P2 or edema.  Abd: PEG in place, no hsm, nl excursion. Ext warm without cyanosis or clubbing.    CXR  01/12/2013 :  No pulmonary edema. Small left basilar atelectasis or scarring. Left pleural effusion has resolved. Drainage catheter is noted in right mid abdomen.       Assessment & Plan:

## 2013-01-12 NOTE — Assessment & Plan Note (Signed)
-   PFT's rec 01/29/2012 > not done  She did not return from pft's previously p rx of gerd eliminated all her symptoms to her satisfaction and not clear why she stopped this rx but despite absence of overt HB it may well be this is the issue again  Therefore rec rx for acid suppression x 4 weeks then return for pft's if not better.

## 2013-01-13 ENCOUNTER — Telehealth: Payer: Self-pay

## 2013-01-13 NOTE — Telephone Encounter (Signed)
Dr. Merla Riches,  Cynthia Bowen called with Mrs.Farooqui INR 2.6.  She is on Coumadin 2.5 daily.  Please advise.

## 2013-01-13 NOTE — Telephone Encounter (Signed)
Patients nurse kim has been advised to continue same dose and repeat the INR in one week.

## 2013-01-13 NOTE — Telephone Encounter (Signed)
Remain same dose Ck 1 week

## 2013-01-14 ENCOUNTER — Telehealth (INDEPENDENT_AMBULATORY_CARE_PROVIDER_SITE_OTHER): Payer: Self-pay | Admitting: General Surgery

## 2013-01-14 NOTE — Telephone Encounter (Signed)
Cynthia Bowen called and stated that Dr Lorin Picket had admitted her into the hospital [facility])  due the right drain was infected. She waited Dr [name] to know. She stated that she had a lot of infection come out when he pulled the drain out.

## 2013-01-15 DIAGNOSIS — D509 Iron deficiency anemia, unspecified: Secondary | ICD-10-CM | POA: Insufficient documentation

## 2013-01-27 ENCOUNTER — Encounter (INDEPENDENT_AMBULATORY_CARE_PROVIDER_SITE_OTHER): Payer: Medicare Other | Admitting: Surgery

## 2013-02-03 ENCOUNTER — Telehealth: Payer: Self-pay

## 2013-02-03 ENCOUNTER — Other Ambulatory Visit: Payer: Self-pay | Admitting: Internal Medicine

## 2013-02-03 NOTE — Telephone Encounter (Signed)
Discussed with Dr Merla Riches, she is to have INR drawn today. [employer] and he will be unable to check INR today. He will check this on Friday and call me with the results. Then we will get her on a Tuesday INR schedule.

## 2013-02-03 NOTE — Telephone Encounter (Signed)
Looks like pt has had quite the year with hospitalizations, surgeries, etc.  Is she still taking the ondansetron?  Last ordered Jan 2014.  If she is still using, ok to send it, but wanted to clarify since meds have likely changed.  Want to make sure meds are accurate

## 2013-02-03 NOTE — Telephone Encounter (Signed)
Disc fri results with Cynthia Bowen

## 2013-02-03 NOTE — Telephone Encounter (Signed)
SCOTT FROM [facility] WOULD LIKE TO KNOW WHO IS GOING TO BE MONITORING PT'S COUMADIN LEVELS PLEASE CALL [phone]

## 2013-02-04 ENCOUNTER — Telehealth: Payer: Self-pay

## 2013-02-04 NOTE — Telephone Encounter (Signed)
HELEN FROM [facility] STATES THEY SENT A FORM TO BE SIGNED BY DR [name] AND NEED TO CHECK STATUS PLEASE CALL [phone] [phone]

## 2013-02-04 NOTE — Telephone Encounter (Signed)
It is in the stack by the fax waiting to be faxed, I put it there today.

## 2013-02-06 ENCOUNTER — Telehealth: Payer: Self-pay | Admitting: Radiology

## 2013-02-06 NOTE — Telephone Encounter (Signed)
Spoke to [employer] to advise. Thanks heather. To Dr Anders Grant

## 2013-02-06 NOTE — Telephone Encounter (Signed)
Stay on current dose and recheck 1 week

## 2013-02-06 NOTE — Telephone Encounter (Signed)
INR is 3.1 current coumadin dose is 7.5 mg daily please advise. Nurse [name] [phone]

## 2013-02-09 ENCOUNTER — Telehealth: Payer: Self-pay

## 2013-02-09 NOTE — Telephone Encounter (Signed)
Erie Noe called from pt's home and had done the PT/INR today instead of tomorrow because pt needed wound dressing change today. The current dose of coumadin in 7.5 QD and the INR today is 3.2. Erie Noe plans to return for another dressing change on Thurs and can repeat INR then or wait until she sees pt early next week. Please advise.

## 2013-02-10 NOTE — Telephone Encounter (Signed)
Ok to wait and repeat next week

## 2013-02-10 NOTE — Telephone Encounter (Signed)
Checked W Dr Merla Riches before calling [facility] RN back and verified that he does want to leave her at same dose until reading next week. I called and spoke w/Vanessa and gave these inst's. Erie Noe agreed to have pt stay at same dose and repeat INR next Tues to get her on the Tues schedule originally requested.

## 2013-02-13 ENCOUNTER — Ambulatory Visit (INDEPENDENT_AMBULATORY_CARE_PROVIDER_SITE_OTHER): Payer: Medicare Other | Admitting: Internal Medicine

## 2013-02-13 ENCOUNTER — Encounter: Payer: Self-pay | Admitting: Internal Medicine

## 2013-02-13 VITALS — BP 100/64 | HR 110 | Temp 98.4°F | Ht 64.0 in | Wt 98.8 lb

## 2013-02-13 DIAGNOSIS — J449 Chronic obstructive pulmonary disease, unspecified: Secondary | ICD-10-CM

## 2013-02-13 NOTE — Patient Instructions (Addendum)
Continue protonix in am and pepcid at bedtime  Only use your albuterol as a rescue medication to be used if you can't catch your breath by resting or doing a relaxed purse lip breathing pattern.  - The less you use it, the better it will work when you need it. - Ok to use up to every 4 hours if you must but call for immediate appointment if use goes up over your usual need - Don't leave home without it !!  (think of it like your spare tire for your car)   Pulmonary follow up is as needed

## 2013-02-13 NOTE — Assessment & Plan Note (Addendum)
-    Spirometry FEV1  1.60 (69%) ratio 68  Not presently limited by breathing, min cough, no need for saba so no need for escalation of care    Each maintenance medication was reviewed in detail including most importantly the difference between maintenance and as needed and under what circumstances the prns are to be used.  Please see instructions for details which were reviewed in writing and the patient given a copy.    Pulmonary f/u can be prn

## 2013-02-13 NOTE — Progress Notes (Signed)
Subjective:    Patient ID: Cynthia Bowen, female    DOB: [birth year]  MRN: [id number]    Brief patient profile:  69 yobf quit smoking 2006 with cough that completely and good ex tolerance then dx pna Sept 2013 and referred 01/29/2012 to pulmonary clinic by Cynthia Bowen for persistent cough/sob.  History of Present Illness  01/29/2012 1st pulmonary eval cc not limited by breathing then developed outpt pneumonia and since then doe x [place] and cough is still productive white, assoc with  fatigue. Feels throat burning and sensation of retained secretions but actual sputum production minimal and some better p dulera.  rec Try protonix 40 mg  Take 30-60 min before first meal of the day and Pepcid 20 mg one bedtime until cough is completely gone for at least a week without the need for cough suppression like delsym  GERD diet  F/u pft's > did not return    01/12/2013 f/u ov/Cynthia Bowen re: downhill since March 2014 stomach surgery  (see Cynthia Bowen ov 01/06/13 for outline of surg care/ complications)  Chief Complaint  Patient presents with  . Follow-up    Pt last seen in Oct 2013. She states was referred back to [place] per Urgent Care for "pulmonary care".  She denies any dyspnea, but does c/o cough x 2 wks- prod with minimal clear sputum. She relates this cough to allergies.     Ever since first surgery some cough returned worse since July 2014 but tends to be worse around 8 pm nightly and goes away until 10 am seems worse p that, no longer on acid suppression - ? Better p inhaler ?   Now strictly on peg feedings   02/13/2013 f/u ov/Cynthia Bowen re: cough > sob  Chief Complaint  Patient presents with  . Follow-up    Pt states cough is unchanged since her last visit. No new co's today.   Not limited from desired activities No need for albuterol  No significant cough vs baseline  When I described her complaint from last ov she said "oh, well I don't have that anymore"    No obvious day to day or daytime  variabilty or assoc chronic cough or cp or chest tightness, subjective wheeze overt sinus or hb symptoms. No unusual exp hx or h/o childhood pna/ asthma or knowledge of premature birth.  Sleeping ok without nocturnal  or early am exacerbation  of respiratory  c/o's or need for noct saba. Also denies any obvious fluctuation of symptoms with weather or environmental changes or other aggravating or alleviating factors except as outlined above   Current Medications, Allergies, Complete Past Medical History, Past Surgical History, Family History, and Social History were reviewed in [employer] record.  ROS  The following are not active complaints unless bolded sore throat, dysphagia, dental problems, itching, sneezing,  nasal congestion or excess/ purulent secretions, ear ache,   fever, chills, sweats, unintended wt loss, pleuritic or exertional cp, hemoptysis,  orthopnea pnd or leg swelling, presyncope, palpitations, heartburn, abdominal pain, anorexia, nausea, vomiting, diarrhea  or change in bowel or urinary habits, change in stools or urine, dysuria,hematuria,  rash, arthralgias, visual complaints, headache, numbness weakness or ataxia or problems with walking or coordination,  change in mood/affect or memory.                Objective:   Physical Exam  02/13/2013      98     01/12/13 98 lb 9.6 oz (44.725 kg)  01/09/13 98 lb 12.8 oz (44.815 kg)  01/06/13 101 lb 9.6 oz (46.085 kg)        HEENT mild turbinate edema.  Oropharynx no thrush or excess pnd or cobblestoning.  No JVD or cervical adenopathy. Mild accessory muscle hypertrophy. Trachea midline, nl thryroid. Chest was hyperinflated by percussion with diminished breath sounds and moderate increased exp time without wheeze. Hoover sign positive at very end of inspiration. Regular rate and rhythm without murmur gallop or rub or increase P2 or edema.  Abd: PEG in place, no hsm, nl excursion. Ext warm without cyanosis  or clubbing.    CXR  01/12/2013 :  No pulmonary edema. Small left basilar atelectasis or scarring. Left pleural effusion has resolved. Drainage catheter is noted in right mid abdomen.       Assessment & Plan:

## 2013-02-16 ENCOUNTER — Telehealth: Payer: Self-pay

## 2013-02-16 NOTE — Telephone Encounter (Signed)
Today is Monday, unsure why they drew INR today, order was for Tuesday draw. Have advised nurse to get back on Tuesday schedule, she agrees. Please advise on the INR of 1.7. Asked Erie Noe if she has skipped/ missed any doses. Nurse states she has not missed any doses or changed any foods. Patient is not eating, nurse is wondering if the PPN she is using for her tube feedings has vit K in it.

## 2013-02-16 NOTE — Telephone Encounter (Signed)
Because the last 2 inr s were high at this dose , don't change a thing recheck next week

## 2013-02-16 NOTE — Telephone Encounter (Signed)
Erie Noe from [facility] called to give lab results to Dr Merla Riches for patient. Her PT was 21.0 and her INR was 1.7. She takes coumadin 7.5 mg daily. Cb# [phone]  Erie Noe)

## 2013-02-17 NOTE — Telephone Encounter (Signed)
Called nurse to advise

## 2013-02-20 ENCOUNTER — Other Ambulatory Visit: Payer: Self-pay | Admitting: Family Medicine

## 2013-02-21 ENCOUNTER — Emergency Department (HOSPITAL_COMMUNITY)
Admission: EM | Admit: 2013-02-21 | Discharge: 2013-02-22 | Disposition: A | Discharge status: Other Acute Inpatient Hospital | Payer: Medicare Other | Attending: Emergency Medicine | Admitting: Emergency Medicine

## 2013-02-21 ENCOUNTER — Encounter (HOSPITAL_COMMUNITY): Payer: Self-pay | Admitting: Emergency Medicine

## 2013-02-21 DIAGNOSIS — K219 Gastro-esophageal reflux disease without esophagitis: Secondary | ICD-10-CM | POA: Insufficient documentation

## 2013-02-21 DIAGNOSIS — Z8739 Personal history of other diseases of the musculoskeletal system and connective tissue: Secondary | ICD-10-CM | POA: Insufficient documentation

## 2013-02-21 DIAGNOSIS — Y838 Other surgical procedures as the cause of abnormal reaction of the patient, or of later complication, without mention of misadventure at the time of the procedure: Secondary | ICD-10-CM | POA: Insufficient documentation

## 2013-02-21 DIAGNOSIS — Z87891 Personal history of nicotine dependence: Secondary | ICD-10-CM | POA: Insufficient documentation

## 2013-02-21 DIAGNOSIS — J4489 Other specified chronic obstructive pulmonary disease: Secondary | ICD-10-CM | POA: Insufficient documentation

## 2013-02-21 DIAGNOSIS — Z9089 Acquired absence of other organs: Secondary | ICD-10-CM | POA: Insufficient documentation

## 2013-02-21 DIAGNOSIS — Z79899 Other long term (current) drug therapy: Secondary | ICD-10-CM | POA: Insufficient documentation

## 2013-02-21 DIAGNOSIS — Z7901 Long term (current) use of anticoagulants: Secondary | ICD-10-CM | POA: Insufficient documentation

## 2013-02-21 DIAGNOSIS — Z8669 Personal history of other diseases of the nervous system and sense organs: Secondary | ICD-10-CM | POA: Insufficient documentation

## 2013-02-21 DIAGNOSIS — IMO0002 Reserved for concepts with insufficient information to code with codable children: Secondary | ICD-10-CM | POA: Insufficient documentation

## 2013-02-21 DIAGNOSIS — G8918 Other acute postprocedural pain: Secondary | ICD-10-CM | POA: Insufficient documentation

## 2013-02-21 DIAGNOSIS — J449 Chronic obstructive pulmonary disease, unspecified: Secondary | ICD-10-CM | POA: Insufficient documentation

## 2013-02-21 DIAGNOSIS — F3289 Other specified depressive episodes: Secondary | ICD-10-CM | POA: Insufficient documentation

## 2013-02-21 DIAGNOSIS — R109 Unspecified abdominal pain: Secondary | ICD-10-CM

## 2013-02-21 DIAGNOSIS — Z8701 Personal history of pneumonia (recurrent): Secondary | ICD-10-CM | POA: Insufficient documentation

## 2013-02-21 DIAGNOSIS — F329 Major depressive disorder, single episode, unspecified: Secondary | ICD-10-CM | POA: Insufficient documentation

## 2013-02-21 LAB — CBC WITH DIFFERENTIAL/PLATELET
Basophils Absolute: 0 10*3/uL (ref 0.0–0.1)
Basophils Relative: 0 % (ref 0–1)
Eosinophils Absolute: 0.3 10*3/uL (ref 0.0–0.7)
Eosinophils Relative: 3 % (ref 0–5)
HCT: 33.2 % — ABNORMAL LOW (ref 36.0–46.0)
Hemoglobin: 11.4 g/dL — ABNORMAL LOW (ref 12.0–15.0)
MCH: 26.7 pg (ref 26.0–34.0)
MCHC: 34.3 g/dL (ref 30.0–36.0)
MCV: 77.8 fL — ABNORMAL LOW (ref 78.0–100.0)
Monocytes Absolute: 0.7 10*3/uL (ref 0.1–1.0)
Monocytes Relative: 5 % (ref 3–12)
RDW: 17.6 % — ABNORMAL HIGH (ref 11.5–15.5)

## 2013-02-21 LAB — COMPREHENSIVE METABOLIC PANEL
AST: 20 U/L (ref 0–37)
Albumin: 2.7 g/dL — ABNORMAL LOW (ref 3.5–5.2)
BUN: 26 mg/dL — ABNORMAL HIGH (ref 6–23)
Calcium: 9.4 mg/dL (ref 8.4–10.5)
Chloride: 101 mEq/L (ref 96–112)
Creatinine, Ser: 0.62 mg/dL (ref 0.50–1.10)
Total Bilirubin: 0.2 mg/dL — ABNORMAL LOW (ref 0.3–1.2)

## 2013-02-21 LAB — PROTIME-INR
INR: 1.85 — ABNORMAL HIGH (ref 0.00–1.49)
Prothrombin Time: 20.8 seconds — ABNORMAL HIGH (ref 11.6–15.2)

## 2013-02-21 MED ORDER — HYDROMORPHONE HCL PF 1 MG/ML IJ SOLN
1.0000 mg | Freq: Once | INTRAMUSCULAR | Status: AC
  Administered 2013-02-21: 1 mg via INTRAVENOUS
  Filled 2013-02-21: qty 1

## 2013-02-21 MED ORDER — SODIUM CHLORIDE 0.9 % IV BOLUS (SEPSIS)
1000.0000 mL | Freq: Once | INTRAVENOUS | Status: AC
  Administered 2013-02-21: 1000 mL via INTRAVENOUS

## 2013-02-21 MED ORDER — FENTANYL CITRATE 0.05 MG/ML IJ SOLN
100.0000 ug | Freq: Once | INTRAMUSCULAR | Status: AC
  Administered 2013-02-21: 100 ug via INTRAVENOUS
  Filled 2013-02-21: qty 2

## 2013-02-21 MED ORDER — IOHEXOL 300 MG/ML  SOLN
20.0000 mL | INTRAMUSCULAR | Status: AC
  Administered 2013-02-21: 25 mL via ORAL

## 2013-02-21 MED ORDER — MORPHINE SULFATE 4 MG/ML IJ SOLN
4.0000 mg | Freq: Once | INTRAMUSCULAR | Status: AC
  Administered 2013-02-21: 4 mg via INTRAVENOUS
  Filled 2013-02-21: qty 1

## 2013-02-21 MED ORDER — ONDANSETRON HCL 4 MG/2ML IJ SOLN
4.0000 mg | Freq: Once | INTRAMUSCULAR | Status: AC
  Administered 2013-02-21: 4 mg via INTRAVENOUS
  Filled 2013-02-21: qty 2

## 2013-02-21 NOTE — ED Notes (Signed)
Pt alert and oriented on arrival.  Lives with nephew

## 2013-02-21 NOTE — ED Notes (Signed)
Pt states they took the J tube out on Sept 26th and she has pain at site.  Pt has purelent drainage noted at site and states this has been going on since removal

## 2013-02-21 NOTE — ED Notes (Signed)
Pt started to drink contrast, contrast coming out of J Tube site.  Pt states she only takes ice chips by mouth at home, feedings via peg tube.  Called CT, per CT give contrast via peg tube.  Give 1 this hour and 1 next hour, CT will start timer at this time.

## 2013-02-21 NOTE — ED Notes (Signed)
Onset 2 hours ago abd pain.  Family member was changing J Tube site dressing.  Area is swollen, red and draining yellow tinged discharge.  Mid line incision from previous hernia surgery red and draining yellow tinged drainage.  Pain is painful to touch.

## 2013-02-21 NOTE — ED Provider Notes (Signed)
CSN: [id number]     Arrival date & time 02/21/13  2034 History   First MD Initiated Contact with Patient 02/21/13 2039     No chief complaint on file.  (Consider location/radiation/quality/duration/timing/severity/associated sxs/prior Treatment) HPI History provided by pt and prior chart.  Pt has a complicated medical history and she is a poor historian.  Per prior chart, underwent exploratory lap for evaluation of abd pain on 11/11/12, was found to have a loop SBO w/ ischemia as well as gastrocutaneous fistula.  Small bowel resection performed, take down of adhesions between stomach and abd wall attempted and J tube placed.  Pt had persistent drainage from mid-line and RUQ incisions and was diagnosed w/ and treated for bacterial as well as fungal infections.  Had f/u with Dr. [name] on 9/16, pain improved, mid-line incision nearly completely healed, RUQ drain w/ only mild induration, no abd tenderness at that time.  Was admitted to [facility] for ? Intra-abdominal infection at the end of September as well (waiting on outside medical records).  Pt presents today w/ acutely worsened abdominal pain as well as increased erythema of abdominal skin and drainage from both mid-line and RUQ surgical sites x 2 days.  Associated w/ N/V.  No fever, diarrhea, hematemesis/hematochezia/melena, GU sx.  Reports that this pain feels different than anything she's had in the past.  Other past abd surgeries include appendectomy.  Past Medical History  Diagnosis Date  . COPD (chronic obstructive pulmonary disease)   . Pneumonia 12-2011  . GERD (gastroesophageal reflux disease)   . Headache(784.0)   . Arthritis     osteoarthritis  . Allergy   . Depression   . Neuromuscular disorder   . Osteoporosis   . Bronchitis     CURRENTLY AS OF 06/30/12 - HAS COUGH AND FINISHED ANTIBIOTIC FOR BRONCHITIS  . Fibromyalgia   . Pain     ABDOMINAL PAIN AND NAUSEA  . Pain     SOMETIMES PAIN RIGHT EAR AND NECK--STATES CAUSED BY A  "LUMP" ON BACK OF EAR--USES KENALOG CREAM TOPICALLY AS NEEDED.  Marland Kitchen Gastrocutaneous fistula    Past Surgical History  Procedure Laterality Date  . Abdominal hysterectomy    . Esophagogastroduodenoscopy  04/18/2012    Procedure: ESOPHAGOGASTRODUODENOSCOPY (EGD);  Surgeon: [name], MD;  Location: [facility];  Service: Endoscopy;  Laterality: N/A;  . Eus  05/29/2012    Procedure: UPPER ENDOSCOPIC ULTRASOUND (EUS) LINEAR;  Surgeon: [name], MD;  Location: [facility];  Service: Endoscopy;  Laterality: N/A;  . Appendectomy    . Spine surgery      CERVICAL SPINE SURGERY X 2 - INCLUDING FUSION; LUMBAR SURGERY FOR RUPTURED DISC  . Eye surgery      BILATERAL CATARACT EXTRACTIONS  . Laparoscopic abdominal exploration N/A 07/02/2012    Procedure: converted to laparotomy with gastric biopsy;  Surgeon: [name]. [name], MD;  Location: [facility];  Service: General;  Laterality: N/A;  Laparoscopic Gastric Biospy attempted.   . Laparotomy N/A 07/07/2012    Procedure: EXPLORATORY LAPAROTOMY repair of gastric perforation with omental graham patch, drainage of abdominal abcess;  Surgeon: [name]. [name], MD;  Location: [facility];  Service: General;  Laterality: N/A;  . Stomach surgery  07/07/2012    Omental patch of gastric perforation  . Laparotomy N/A 07/13/2012    Procedure: EXPLORATORY LAPAROTOMY repair gastric leak;  Surgeon: Mariella Saa, MD;  Location: [facility];  Service: General;  Laterality: N/A;  . Laparotomy N/A 11/11/2012  Procedure: EXPLORATORY LAPAROTOMY;  Surgeon: [name]. [name], MD;  Location: [facility] OR;  Service: General;  Laterality: N/A;  . Bowel resection N/A 11/11/2012    Procedure: SMALL BOWEL RESECTION;  Surgeon: [name]. [name], MD;  Location: [facility] OR;  Service: General;  Laterality: N/A;  . Minor application of wound vac N/A 11/11/2012    Procedure: APPLICATION OF WOUND VAC;  Surgeon: [name]. [name], MD;  Location: [facility] OR;  Service: General;  Laterality: N/A;  .  Jejunostomy N/A 11/11/2012    Procedure: PLACEMENT OF FEEDING JEJUNOSTOMY TUBE;  Surgeon: [name]. [name], MD;  Location: [facility] OR;  Service: General;  Laterality: N/A;  . Lysis of adhesion N/A 11/11/2012    Procedure: LYSIS OF ADHESION;  Surgeon: [name]. [name], MD;  Location: [facility] OR;  Service: General;  Laterality: N/A;   Family History  Problem Relation Age of Onset  . COPD Mother   . Hypertension Maternal Grandmother    History  Substance Use Topics  . Smoking status: Former Smoker -- 0.50 packs/day for 35 years    Types: Cigarettes    Start date: 01/04/2003    Quit date: 04/17/2005  . Smokeless tobacco: Never Used  . Alcohol Use: No   OB History   Grav Para Term Preterm Abortions TAB SAB Ect Mult Living                 Review of Systems  All other systems reviewed and are negative.    Allergies  Avelox; Betadine; Alendronate sodium; Aspirin; Codeine; Doxycycline; Fluconazole; Hydrocodone; Neurontin; Nsaids; Sertraline hcl; and Sulfa antibiotics  Home Medications   Current Outpatient Rx  Name  Route  Sig  Dispense  Refill  . diphenhydrAMINE (BENADRYL) 12.5 MG/5ML liquid   Oral   Take 12.5 mg by mouth 4 (four) times daily as needed for itching.          . fentaNYL (DURAGESIC - DOSED MCG/HR) 50 MCG/HR   Transdermal   Place 2 patches onto the skin every 3 (three) days.         Marland Kitchen FLUoxetine (PROZAC) 20 MG/5ML solution   Feeding Tube   20 mg by Feeding Tube route daily.         Marland Kitchen morphine 10 MG/5ML solution   Tube   Give 10 mg by tube every 3 (three) hours as needed for pain.         Marland Kitchen ondansetron (ZOFRAN-ODT) 4 MG disintegrating tablet   Oral   Take 4 mg by mouth 2 (two) times daily as needed for nausea.          . pantoprazole sodium (PROTONIX) 40 mg/20 mL PACK   Feeding Tube   40 mg by Feeding Tube route daily.         . ranitidine (ZANTAC) 150 MG/10ML syrup   Tube   Give 150 mg by tube daily.         Marland Kitchen warfarin (COUMADIN) 7.5 MG tablet    Tube   Give 7.5 mg by tube every evening.          Marland Kitchen albuterol (PROVENTIL HFA;VENTOLIN HFA) 108 (90 BASE) MCG/ACT inhaler   Inhalation   Inhale 2 puffs into the lungs every 6 (six) hours as needed for wheezing.         Marland Kitchen EPINEPHrine (EPIPEN) 0.3 mg/0.3 mL SOAJ   Intramuscular   Inject 0.3 mg into the muscle daily as needed (anaphylaxis).          BP  132/70  Pulse 88  Temp(Src) 97.5 F (36.4 C) (Oral)  Resp 18  SpO2 91% Physical Exam  Nursing note and vitals reviewed. Constitutional: She is oriented to person, place, and time. She appears well-developed and well-nourished. No distress.  Pt appears uncomfortable and is very restless.    HENT:  Head: Normocephalic and atraumatic.  Eyes:  Normal appearance  Neck: Normal range of motion.  Cardiovascular: Normal rate and regular rhythm.   Pulmonary/Chest: Effort normal and breath sounds normal. No respiratory distress.  Abdominal: Soft. Bowel sounds are normal. She exhibits no distension and no mass. There is no tenderness. There is no rebound and no guarding.  Abd wall diffusely erythematous w/ well-defined margins.  J tube on L side of abd.  Thick, white drainage superior to umbilicus at mid-line and serous drainage from surgical site at RUQ.  Diffuse ttp w/ guarding.   Genitourinary:  No CVA tenderness  Musculoskeletal: Normal range of motion.  Neurological: She is alert and oriented to person, place, and time.  Skin: Skin is warm and dry. No rash noted.  Psychiatric: She has a normal mood and affect. Her behavior is normal.    ED Course  Procedures (including critical care time) Labs Review Labs Reviewed  CBC WITH DIFFERENTIAL - Abnormal; Notable for the following:    WBC 12.7 (*)    Hemoglobin 11.4 (*)    HCT 33.2 (*)    MCV 77.8 (*)    RDW 17.6 (*)    Platelets 423 (*)    Neutrophils Relative % 78 (*)    Neutro Abs 9.9 (*)    All other components within normal limits  COMPREHENSIVE METABOLIC PANEL -  Abnormal; Notable for the following:    BUN 26 (*)    Total Protein 8.6 (*)    Albumin 2.7 (*)    Total Bilirubin 0.2 (*)    All other components within normal limits  PROTIME-INR - Abnormal; Notable for the following:    Prothrombin Time 20.8 (*)    INR 1.85 (*)    All other components within normal limits   Imaging Review No results found.  EKG Interpretation   None       MDM   1. 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  Afebrile and non-toxic but very uncomfortable appearing on exam.  Labs sig for mild leukocytosis and subtherapeutic INR. PT has received IV narcotics for pain w/ some relief.  CT abd/pelvis pending.   CT non-acute.  Discussed results w/ Dr. [name] and he will examine patient.  She continues to be very restless and tearful d/t the "burning pain in abdomen and persistent drainage".     Dr. [name] to have patient transferred to [facility] for further management.  3:20 AM     Otilio Miu, PA-C 02/22/13 0320  Otilio Miu, PA-C 03/10/13 [phone]

## 2013-02-21 NOTE — ED Notes (Signed)
Pt has had a J tube pt now has towel draped over spot per EMS. Pt c/o abd pain x 2 hr

## 2013-02-21 NOTE — ED Notes (Signed)
1st 325 ml of contrast in via J tube.

## 2013-02-22 ENCOUNTER — Emergency Department (HOSPITAL_COMMUNITY): Payer: Medicare Other

## 2013-02-22 DIAGNOSIS — L03311 Cellulitis of abdominal wall: Secondary | ICD-10-CM | POA: Insufficient documentation

## 2013-02-22 DIAGNOSIS — L02219 Cutaneous abscess of trunk, unspecified: Secondary | ICD-10-CM | POA: Insufficient documentation

## 2013-02-22 MED ORDER — IOHEXOL 300 MG/ML  SOLN
80.0000 mL | Freq: Once | INTRAMUSCULAR | Status: AC | PRN
  Administered 2013-02-22: 80 mL via INTRAVENOUS

## 2013-02-22 MED ORDER — FENTANYL CITRATE 0.05 MG/ML IJ SOLN: 100.0000 ug | Freq: Once | INTRAMUSCULAR | Status: DC

## 2013-02-22 MED ORDER — HYDROMORPHONE HCL PF 1 MG/ML IJ SOLN
1.0000 mg | Freq: Once | INTRAMUSCULAR | Status: AC
  Administered 2013-02-22: 1 mg via INTRAVENOUS
  Filled 2013-02-22: qty 1

## 2013-02-22 MED ORDER — FENTANYL CITRATE 0.05 MG/ML IJ SOLN
100.0000 ug | Freq: Once | INTRAMUSCULAR | Status: AC
  Administered 2013-02-22: 100 ug via INTRAVENOUS
  Filled 2013-02-22: qty 2

## 2013-02-22 MED ORDER — FENTANYL CITRATE 0.05 MG/ML IJ SOLN
INTRAMUSCULAR | Status: AC
  Filled 2013-02-22: qty 2

## 2013-02-22 NOTE — ED Notes (Signed)
Pt's nephew in hallway yelling "I need a nurse". Various nurses in the department arrived at room. Pt's nephew yelling at one of the nurses stated he needed a competent nurse to assess his aunt "no one is helping her; she is in pain; she needs help now!" Pt's nephew continuing to yell and be verbally hostile with staff. Pt's nephew escorted out of the department by security and [employer].
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
## 2013-02-22 NOTE — ED Notes (Signed)
Pt given two new abdominal pads, gastric secretions leaking out of wound.
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
## 2013-02-22 NOTE — ED Notes (Signed)
CT called and informed receiving MD has requested a CD-ROM copy of scan results.

## 2013-02-22 NOTE — ED Notes (Signed)
Pt transported to CT ?

## 2013-02-22 NOTE — ED Notes (Signed)
Update on pt's status given to pt's brother-in-law on the phone.  Pt gave permission to this RN to give update.

## 2013-02-22 NOTE — ED Notes (Signed)
Awaiting CD-ROM from CT before transferring pt per Dr. Lily Peer request.

## 2013-02-22 NOTE — Consult Note (Signed)
Reason for Consult:Abdominal wall infection Referring Physician: Lavella Lemons - EDP  Cynthia Bowen is an 67 y.o. female.  HPI: 66 yo female with complicated past surgical history, recently discharged from [facility] - Dr. Sima Matas She has a chronic gastrocutaneous fistula and had a right percutaneous drain for a subhepatic abscess.  This drain was apparently removed recently and she also had some type of endoscopic procedure.  She had a pouch on her abdominal wall that had been collecting the drainage, but apparently this came off.  She came to the ED with severe pain on her right side with erythema and drainage.  Her pain has been difficult to control with IV pain meds.  A CT scan tonight showed no intra-abdominal abscess or other acute processes, but air and fluid along the tract of the old drain.  There seems to be some air accumulated under the skin in the right upper quadrant.    Past Medical History  Diagnosis Date  . COPD (chronic obstructive pulmonary disease)   . Pneumonia 12-2011  . GERD (gastroesophageal reflux disease)   . Headache(784.0)   . Arthritis     osteoarthritis  . Allergy   . Depression   . Neuromuscular disorder   . Osteoporosis   . Bronchitis     CURRENTLY AS OF 06/30/12 - HAS COUGH AND FINISHED ANTIBIOTIC FOR BRONCHITIS  . Fibromyalgia   . Pain     ABDOMINAL PAIN AND NAUSEA  . Pain     SOMETIMES PAIN RIGHT EAR AND NECK--STATES CAUSED BY A "LUMP" ON BACK OF EAR--USES KENALOG CREAM TOPICALLY AS NEEDED.  Marland Kitchen Gastrocutaneous fistula     Past Surgical History  Procedure Laterality Date  . Abdominal hysterectomy    . Esophagogastroduodenoscopy  04/18/2012    Procedure: ESOPHAGOGASTRODUODENOSCOPY (EGD);  Surgeon: [name], MD;  Location: [facility];  Service: Endoscopy;  Laterality: N/A;  . Eus  05/29/2012    Procedure: UPPER ENDOSCOPIC ULTRASOUND (EUS) LINEAR;  Surgeon: [name], MD;  Location: [facility];  Service: Endoscopy;  Laterality: N/A;  .  Appendectomy    . Spine surgery      CERVICAL SPINE SURGERY X 2 - INCLUDING FUSION; LUMBAR SURGERY FOR RUPTURED DISC  . Eye surgery      BILATERAL CATARACT EXTRACTIONS  . Laparoscopic abdominal exploration N/A 07/02/2012    Procedure: converted to laparotomy with gastric biopsy;  Surgeon: [name]. [name], MD;  Location: [facility];  Service: General;  Laterality: N/A;  Laparoscopic Gastric Biospy attempted.   . Laparotomy N/A 07/07/2012    Procedure: EXPLORATORY LAPAROTOMY repair of gastric perforation with omental graham patch, drainage of abdominal abcess;  Surgeon: [name]. [name], MD;  Location: [facility];  Service: General;  Laterality: N/A;  . Stomach surgery  07/07/2012    Omental patch of gastric perforation  . Laparotomy N/A 07/13/2012    Procedure: EXPLORATORY LAPAROTOMY repair gastric leak;  Surgeon: Mariella Saa, MD;  Location: [facility];  Service: General;  Laterality: N/A;  . Laparotomy N/A 11/11/2012    Procedure: EXPLORATORY LAPAROTOMY;  Surgeon: [name]. [name], MD;  Location: [facility];  Service: General;  Laterality: N/A;  . Bowel resection N/A 11/11/2012    Procedure: SMALL BOWEL RESECTION;  Surgeon: [name]. [name], MD;  Location: [facility];  Service: General;  Laterality: N/A;  . Minor application of wound vac N/A 11/11/2012    Procedure: APPLICATION OF WOUND VAC;  Surgeon: [name]. [name], MD;  Location: [facility];  Service: General;  Laterality: N/A;  . Jejunostomy N/A 11/11/2012    Procedure: PLACEMENT OF FEEDING JEJUNOSTOMY TUBE;  Surgeon: [name]. [name], MD;  Location: [facility];  Service: General;  Laterality: N/A;  . Lysis of adhesion N/A 11/11/2012    Procedure: LYSIS OF ADHESION;  Surgeon: [name]. [name], MD;  Location: [facility];  Service: General;  Laterality: N/A;    Family History  Problem Relation Age of Onset  . COPD Mother   . Hypertension Maternal Grandmother     Social History:  reports that she quit smoking about 7 years ago. Her smoking use included Cigarettes. She  started smoking about 10 years ago. She has a 17.5 pack-year smoking history. She has never used smokeless tobacco. She reports that she does not drink alcohol or use illicit drugs.  Allergies:  Allergies  Allergen Reactions  . Avelox [Moxifloxacin Hcl In Nacl] Nausea And Vomiting  . Betadine [Povidone Iodine] Itching and Rash  . Alendronate Sodium Nausea And Vomiting  . Aspirin Nausea Only  . Codeine Nausea And Vomiting  . Doxycycline     Pt doesn't remember reaction  . Fluconazole     Pt doesn't remember reaction  . Hydrocodone Nausea And Vomiting    GI distress  . Neurontin [Gabapentin] Other (See Comments)    Mood changes   . Nsaids Other (See Comments)    Severe gastritis & perforation - avoid NSAIDs when possible  . Sertraline Hcl Other (See Comments)    Hallucinations   . Sulfa Antibiotics Rash    Medications:  Prior to Admission medications   Medication Sig Start Date End Date Taking? Authorizing Provider  diphenhydrAMINE (BENADRYL) 12.5 MG/5ML liquid Take 12.5 mg by mouth 4 (four) times daily as needed for itching.    Yes Historical Provider, MD  fentaNYL (DURAGESIC - DOSED MCG/HR) 50 MCG/HR Place 2 patches onto the skin every 3 (three) days.   Yes Historical Provider, MD  FLUoxetine (PROZAC) 20 MG/5ML solution 20 mg by Feeding Tube route daily.   Yes Historical Provider, MD  morphine 10 MG/5ML solution Give 10 mg by tube every 3 (three) hours as needed for pain.   Yes Historical Provider, MD  ondansetron (ZOFRAN-ODT) 4 MG disintegrating tablet Take 4 mg by mouth 2 (two) times daily as needed for nausea.    Yes Historical Provider, MD  pantoprazole sodium (PROTONIX) 40 mg/20 mL PACK 40 mg by Feeding Tube route daily.   Yes Historical Provider, MD  ranitidine (ZANTAC) 150 MG/10ML syrup Give 150 mg by tube daily.   Yes Historical Provider, MD  warfarin (COUMADIN) 7.5 MG tablet Give 7.5 mg by tube every evening.    Yes Historical Provider, MD  albuterol (PROVENTIL  HFA;VENTOLIN HFA) 108 (90 BASE) MCG/ACT inhaler Inhale 2 puffs into the lungs every 6 (six) hours as needed for wheezing.    Historical Provider, MD  EPINEPHrine (EPIPEN) 0.3 mg/0.3 mL SOAJ Inject 0.3 mg into the muscle daily as needed (anaphylaxis).    Historical Provider, MD    Results for orders placed during the hospital encounter of 02/21/13 (from the past 48 hour(s))  CBC WITH DIFFERENTIAL     Status: Abnormal   Collection Time    02/21/13  9:31 PM      Result Value Range   WBC 12.7 (*) 4.0 - 10.5 K/uL   RBC 4.27  3.87 - 5.11 MIL/uL   Hemoglobin 11.4 (*) 12.0 - 15.0 g/dL   HCT 14.7 (*) 82.9 - 56.2 %  MCV 77.8 (*) 78.0 - 100.0 fL   MCH 26.7  26.0 - 34.0 pg   MCHC 34.3  30.0 - 36.0 g/dL   RDW 16.1 (*) 09.6 - 04.5 %   Platelets 423 (*) 150 - 400 K/uL   Neutrophils Relative % 78 (*) 43 - 77 %   Neutro Abs 9.9 (*) 1.7 - 7.7 K/uL   Lymphocytes Relative 14  12 - 46 %   Lymphs Abs 1.8  0.7 - 4.0 K/uL   Monocytes Relative 5  3 - 12 %   Monocytes Absolute 0.7  0.1 - 1.0 K/uL   Eosinophils Relative 3  0 - 5 %   Eosinophils Absolute 0.3  0.0 - 0.7 K/uL   Basophils Relative 0  0 - 1 %   Basophils Absolute 0.0  0.0 - 0.1 K/uL  COMPREHENSIVE METABOLIC PANEL     Status: Abnormal   Collection Time    02/21/13  9:31 PM      Result Value Range   Sodium 137  135 - 145 mEq/L   Potassium 4.0  3.5 - 5.1 mEq/L   Chloride 101  96 - 112 mEq/L   CO2 28  19 - 32 mEq/L   Glucose, Bld 82  70 - 99 mg/dL   BUN 26 (*) 6 - 23 mg/dL   Creatinine, Ser 4.09  0.50 - 1.10 mg/dL   Calcium 9.4  8.4 - 81.1 mg/dL   Total Protein 8.6 (*) 6.0 - 8.3 g/dL   Albumin 2.7 (*) 3.5 - 5.2 g/dL   AST 20  0 - 37 U/L   ALT 12  0 - 35 U/L   Alkaline Phosphatase 89  39 - 117 U/L   Total Bilirubin 0.2 (*) 0.3 - 1.2 mg/dL   GFR calc non Af Amer >90  >90 mL/min   GFR calc Af Amer >90  >90 mL/min   Comment: (NOTE)     The eGFR has been calculated using the CKD EPI equation.     This calculation has not been validated in  all clinical situations.     eGFR's persistently <90 mL/min signify possible Chronic Kidney     Disease.  PROTIME-INR     Status: Abnormal   Collection Time    02/21/13  9:31 PM      Result Value Range   Prothrombin Time 20.8 (*) 11.6 - 15.2 seconds   INR 1.85 (*) 0.00 - 1.49    Ct Abdomen Pelvis W Contrast  02/22/2013   CLINICAL DATA:  Postoperative wound infection, at jejunostomy tube placement, right side in mid abdominal pain, nausea, vomiting  EXAM: CT ABDOMEN AND PELVIS WITH CONTRAST  TECHNIQUE: Multidetector CT imaging of the abdomen and pelvis was performed using the standard protocol following bolus administration of intravenous contrast. Sagittal and coronal MPR images reconstructed from axial data set.  CONTRAST:  Dilute oral contrast via J-tube. 80 cc Omnipaque 300 IV.  COMPARISON:  12/08/2012  FINDINGS: Atelectasis at both lung bases.  Bilateral renal cysts.  Multiple nonobstructing right renal calculi.  Liver, spleen, pancreas, kidneys, and adrenal glands otherwise normal appearance.  Thickening of the wall of the gastric antrum.  Extending from the thickened gastric antrum, a linear collection of gas is seen extending to the skin surface in the right anterior abdominal wall, question fistulous tract following the course of a previously identified pigtail drainage catheter.  This likely communicates with the stomach.  Jejunostomy tube is noted in a jejunal loop in the  left mid abdomen with administered jejunal contrast within bowel lumen extending to colon.  No definite contrast extravasation identified.  Extensive atherosclerotic calcifications aorta and iliac arteries is walls abdominal branch vessels.  Few scattered loops of mildly thickened small bowel are incidentally noted.  No evidence of bowel obstruction or perforation.  Small right ovarian cyst 2.8 x 2.1 cm image 54.  Unremarkable bladder in ureters.  No mass, adenopathy, free air, or abscess.  IMPRESSION: Jejunostomy tube is  within the jejunum without evidence of contrast extravasation.  Marked thickening of the wall of the gastric antrum and pyloric region with linear gas collection extending to the right upper quadrant anterior abdominal wall most likely representing a fistulous tract from the stomach along the course of a prior percutaneous pigtail drainage catheter ; there is associated soft tissue thickening and stranding along the track with overlying skin thickening.  No definite intra-abdominal abscess is identified.   Electronically Signed   By: Ulyses Southward M.D.   On: 02/22/2013 01:17    ROS Blood pressure 104/92, pulse 89, temperature 97.5 F (36.4 C), temperature source Oral, resp. rate 20, SpO2 99.00%. Physical Exam Thin female - uncomfortable appearing Lungs - CTA B CV - RRR Abd - LUQ jejunostomy tube - site clean Midline incision with small fistula - minimal drainage RUQ - small pinpoint opening with some yellowish drainage; widespread surrounding erythema/ induration/ weeping maceration  Assessment/Plan: 1.  Persistent gastrocutaneous fistula - draining through midline as well as RUQ 2.  Severe cellulitis/ maceration of the skin/ subcutaneous tissues of the right upper quadrant around the site of the old percutaneous drainage tube - possible subcutaneous abscess 3.  Difficult pain control  I discussed the situation with the patient.  She has a difficult management problem, as it is almost impossible to get a pouch to adhere to the macerated skin.  She may need to have the skin opened widely to allow for easier dressing changes.  Since she requires admission, she requests to be sent back to Dr. Karma Greaser service at [facility].  We will make these arrangements and transport her to [facility].  I spoke with Dr. Lily Peer via PAL line.  He is covering for Dr. Lorin Picket and will accept the patient in transfer.  Kyrie Bun K. 02/22/2013, 2:51 AM

## 2013-02-23 NOTE — Telephone Encounter (Signed)
On coumadin and shouldn't take melox at same time

## 2013-02-23 NOTE — Telephone Encounter (Signed)
Dr Merla Riches, pt was Rxd this by Dr Alwyn Ren at 11/2011 OV for only #30 NRF. I don't see where it has been on any of pt's recent med lists. Since you are pt's PCP and manage her musc skel issues I wanted to make sure you want pt taking this?

## 2013-02-25 ENCOUNTER — Ambulatory Visit (INDEPENDENT_AMBULATORY_CARE_PROVIDER_SITE_OTHER): Payer: Medicare Other | Admitting: Internal Medicine

## 2013-02-25 ENCOUNTER — Telehealth: Payer: Self-pay

## 2013-02-25 ENCOUNTER — Encounter: Payer: Self-pay | Admitting: Internal Medicine

## 2013-02-25 VITALS — BP 100/60 | HR 86 | Temp 98.6°F | Resp 16 | Ht 64.5 in | Wt 101.2 lb

## 2013-02-25 DIAGNOSIS — Z934 Other artificial openings of gastrointestinal tract status: Secondary | ICD-10-CM

## 2013-02-25 DIAGNOSIS — L02219 Cutaneous abscess of trunk, unspecified: Secondary | ICD-10-CM

## 2013-02-25 DIAGNOSIS — F329 Major depressive disorder, single episode, unspecified: Secondary | ICD-10-CM

## 2013-02-25 DIAGNOSIS — D509 Iron deficiency anemia, unspecified: Secondary | ICD-10-CM

## 2013-02-25 DIAGNOSIS — K316 Fistula of stomach and duodenum: Secondary | ICD-10-CM

## 2013-02-25 DIAGNOSIS — R109 Unspecified abdominal pain: Secondary | ICD-10-CM

## 2013-02-25 DIAGNOSIS — K3189 Other diseases of stomach and duodenum: Secondary | ICD-10-CM

## 2013-02-25 DIAGNOSIS — I2699 Other pulmonary embolism without acute cor pulmonale: Secondary | ICD-10-CM

## 2013-02-25 DIAGNOSIS — Z0000 Encounter for general adult medical examination without abnormal findings: Secondary | ICD-10-CM

## 2013-02-25 DIAGNOSIS — K219 Gastro-esophageal reflux disease without esophagitis: Secondary | ICD-10-CM

## 2013-02-25 DIAGNOSIS — E43 Unspecified severe protein-calorie malnutrition: Secondary | ICD-10-CM

## 2013-02-25 MED ORDER — CEPHALEXIN 250 MG/5ML PO SUSR: 500.0000 mg | Freq: Three times a day (TID) | ORAL | Status: AC

## 2013-02-25 MED ORDER — HYDROCORTISONE 2.5 % EX CREA: TOPICAL_CREAM | Freq: Two times a day (BID) | CUTANEOUS | Status: DC

## 2013-02-25 NOTE — Telephone Encounter (Signed)
RN from [facility] called to report PT/INR values today. PT 21.9, INR 1.8. She reported that pt is currently taking (according to her med list) is 2.5 mg QD. Pt was just DCd from hosp w/Dx of abd wall cellulitis. Pt has appt w/Dr Merla Riches today and Erie Noe requests CB to her w/new dose and order for when next PT/INR is needed.  Called Erie Noe back to verify current dose because pt has been on 7.5 mg QD according to our records. Dr Merla Riches, I'm forwarding this so you'll have info for your OV, but will update after Erie Noe returns call.

## 2013-02-25 NOTE — Progress Notes (Signed)
  Subjective:    Patient ID: Cynthia Bowen, female    DOB: Oct 31, 1945, 67 y.o.   MRN: [id number]  HPI    Review of Systems  Constitutional: Positive for appetite change and unexpected weight change.  HENT: Positive for ear pain.   Eyes: Positive for itching.  Respiratory: Negative.   Cardiovascular: Negative.   Gastrointestinal: Positive for nausea, vomiting and abdominal pain.  Endocrine: Positive for cold intolerance and polydipsia.  Genitourinary: Negative.   Musculoskeletal: Positive for arthralgias and back pain.  Skin: Positive for rash and wound.  Allergic/Immunologic: Negative.   Neurological: Negative.   Hematological: Negative.   Psychiatric/Behavioral: Negative.        Objective:   Physical Exam        Assessment & Plan:

## 2013-02-25 NOTE — Telephone Encounter (Signed)
RN called back and advised that I am right in that pt is on total of 7.5 mg QD. She is still taking 3 tablets of the 2.5 mg QD.

## 2013-02-25 NOTE — Progress Notes (Signed)
  Subjective:    Patient ID: Cynthia Bowen, female    DOB: 1945/05/03, 67 y.o.   MRN: [id number]  HPIher-e to cover annual exam issues in midst of ongoing problems with gastrocut fistula Just d/ced [facility] 11/2 for cellulitis abd wall at fistula exit---was started on keflex in hosp but not given rx upon d/c. Altho she has had no fever since d/c she notes incr redness, firmness and pain at exit site--continues with copious drainage Pain meds taken on time doing an adequate job at present Face with forehead rash /itching then desquamates for weeks-no chg w/ alovera  Cont w/ coumadin s/p PTE--inr just under 2 at 7.5 daily---had episode of very high with much lower dose earlier  Longstanding depression amazingly stable on prozac in face of all recent disabling and difficult medical issues gerd may or may not be an issue but she continues on protonix  Malnutrition biggest issue holding up surgical repair and impairing healing in general BUT she has now gained to 102 thru aggressive tube feedings    Review of Systems     Objective:   Physical Exam BP 100/60  Pulse 86  Temp(Src) 98.6 F (37 C) (Oral)  Resp 16  Ht 5' 4.5" (1.638 m)  Wt 101 lb 3.2 oz (45.904 kg)  BMI 17.11 kg/m2  SpO2 98% NAD x pain at fistula site as she sits--meds are about to wear off HEENT clear  Lungs clear Heart reg w/out M or carotid bruit Abd Disrupted by feeding tube/midline incision not healed/still draining/fistula drainage site with copious drainage in bag-erythema and induration around without abscess--very tender Extr=full pulses no edema Skin red scaly maculopap areas over forehead at scalp line CN 2-12 int Mood good /amazingly      Assessment & Plan:  Annual Exam  Abdominal pain due to wounds  Cellulitis and abscess of trunk/fistula site---to complete 5 d keflex  Depression-contin prozac  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 [id number]  Gastrocutaneous  fistula-hoping for repair  GERD (gastroesophageal reflux disease)-cont protonix-  Iron deficiency anemia  Protein-calorie malnutrition, severe--improving  PE (pulmonary embolism)---07/2012 with scan 7/14 showing no new thrombus--has completed 6 mos anticoag/in an effort to avoid further complications I recommend discontinuing Coumadin at this point-  Jejunostomy tube in situ  Contact derm -hytone/moisturizer  Next f/u [facility] 12/3 F/u 3 mos/ sooner if probs Meds ordered this encounter  Medications  . hydrocortisone 2.5 % cream    Sig: Apply topically 2 (two) times daily.    Dispense:  30 g    Refill:  0  . cephALEXin (KEFLEX) 250 MG/5ML suspension    Sig: Take 10 mLs (500 mg total) by mouth 3 (three) times daily.    Dispense:  150 mL    Refill:  0  proz refilled 11/1 also ptrtonix

## 2013-03-02 ENCOUNTER — Ambulatory Visit (INDEPENDENT_AMBULATORY_CARE_PROVIDER_SITE_OTHER): Payer: Medicare Other | Admitting: Internal Medicine

## 2013-03-02 ENCOUNTER — Telehealth: Payer: Self-pay

## 2013-03-02 VITALS — BP 116/60 | HR 110 | Temp 99.8°F | Resp 16 | Ht 65.25 in | Wt 110.0 lb

## 2013-03-02 DIAGNOSIS — R109 Unspecified abdominal pain: Secondary | ICD-10-CM

## 2013-03-02 DIAGNOSIS — K316 Fistula of stomach and duodenum: Secondary | ICD-10-CM

## 2013-03-02 LAB — POCT CBC
Granulocyte percent: 80.1 %G — AB (ref 37–80)
Hemoglobin: 11.6 g/dL — AB (ref 12.2–16.2)
MCH, POC: 25.6 pg — AB (ref 27–31.2)
MCV: 81.6 fL (ref 80–97)
MPV: 8.3 fL (ref 0–99.8)
RBC: 4.53 M/uL (ref 4.04–5.48)
WBC: 13.2 10*3/uL — AB (ref 4.6–10.2)

## 2013-03-02 NOTE — Telephone Encounter (Signed)
PT STATES THEY NEED SOME ADVISE AND Claris Che A CALL BACK AT [phone]

## 2013-03-02 NOTE — Telephone Encounter (Signed)
Called and spoke to her caretaker, her wound looks good but she is having abdominal pain. She has refused to go to the hospital. I have advised caretaker you are here today at 5, and can see her, but she may still end up at hospital. To you [facility]

## 2013-03-02 NOTE — Telephone Encounter (Signed)
Erie Noe, RN from [facility], called to report that when she saw pt today the pt was experiencing terrible abdominal pain. Pt is planning to come see Dr Merla Riches tonight instead of going to ER. Even though PT/INR was scheduled for tomorrow, Erie Noe checked it today since pt is coming in to see Dr Merla Riches. INR today is 3.3 and she has continued on the 7.5 mg QD dose. Dr Merla Riches, [place] Childes.

## 2013-03-02 NOTE — Patient Instructions (Addendum)
To wake er for evaluation  Use morphine at 10mg  every 3 hrs til at the er Reduce coumadin to 5 mg daily

## 2013-03-02 NOTE — Progress Notes (Signed)
This chart was scribed for Ellamae Sia, MD by Joaquin Music, ED Scribe. This patient was seen in room Room 2 and the patient's care was started at 6:26 PM. Subjective:    Patient ID: Cynthia Bowen, female    DOB: [birth year], 67 y.o.   MRN: [id number] Chief Complaint  Patient presents with  . Increased abd pain and swelling at gcfistula over last 2-3 days       HPI Cynthia Bowen is a 67 y.o. female who presents to the [facility] complaining of significant increase of pain around Gastrocutaneous fistula . Pt states she is in great pain despite her current pain medication she is taking(fentanyl and q3h morphine 5mg ). She states her swelling has worsened since her last visit (02/25/2013). She states she has been having nausea. Pt denies emesis. No chills or fever. Still having drainage including after feedings. See 11/05 OV-post d/c from [facility] 11/3 after treatment of ?cellulitis in this same area. Sent home on no antibiotics but restarted on keflex on 11/5.  Patient Active Problem List   Diagnosis Date Noted  . Cellulitis and abscess of abdominal wall 02/22/2013  . Protein calorie malnutrition 01/15/2013  . Iron deficiency anemia 01/15/2013  . Jejunostomy tube in situ 01/09/2013  . Acute respiratory failure 11/11/2012  . Altered mental status 11/11/2012  . HTN (hypertension) 11/11/2012  . Diabetes 11/11/2012  . 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 Hemoptysis 08/06/2012  . PE (pulmonary embolism)----INR 3.2 today( at 7.5 qd) for last 3 weeks has been the same///6 mos of anticoag completed and pulmonary eval by Dr Sherene Sires 9/14 was WNL so she will be or should be able to stop anticoag  08/05/2012  . Abdominal pain 08/05/2012  . Incarcerated epigastric hernia s/p primary repair 07/02/2012 07/11/2012  . Protein-calorie malnutrition, severe 07/11/2012  . 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 [id number] 07/10/2012  . Loss of weight 06/19/2012  .  Anorexia 06/19/2012  . Unspecified gastritis and gastroduodenitis without mention of hemorrhage 04/18/2012  . Pericardial effusion 04/17/2012  . COPD GOLD I 01/29/2012  . Community acquired pneumonia 12/29/2011  . Back pain 12/29/2011  . Depression 04/02/2011  . GERD (gastroesophageal reflux disease) 04/02/2011  . Osteoarthritis 04/02/2011  . Cervical neck pain with evidence of disc disease 04/02/2011  . Fibromyalgia 04/02/2011   Past Surgical History  Procedure Laterality Date  . Abdominal hysterectomy    . Esophagogastroduodenoscopy  04/18/2012    Procedure: ESOPHAGOGASTRODUODENOSCOPY (EGD);  Surgeon: [name], MD;  Location: [facility];  Service: Endoscopy;  Laterality: N/A;  . Eus  05/29/2012    Procedure: UPPER ENDOSCOPIC ULTRASOUND (EUS) LINEAR;  Surgeon: [name], MD;  Location: [facility];  Service: Endoscopy;  Laterality: N/A;  . Appendectomy    . Spine surgery      CERVICAL SPINE SURGERY X 2 - INCLUDING FUSION; LUMBAR SURGERY FOR RUPTURED DISC  . Eye surgery      BILATERAL CATARACT EXTRACTIONS  . Laparoscopic abdominal exploration N/A 07/02/2012    Procedure: converted to laparotomy with gastric biopsy;  Surgeon: [name]. [name], MD;  Location: [facility];  Service: General;  Laterality: N/A;  Laparoscopic Gastric Biospy attempted.   . Laparotomy N/A 07/07/2012    Procedure: EXPLORATORY LAPAROTOMY repair of gastric perforation with omental graham patch, drainage of abdominal abcess;  Surgeon: [name]. [name], MD;  [facility];  Service: General;  Laterality: N/A;  . Stomach surgery  07/07/2012    Omental patch of gastric perforation  .  Laparotomy N/A 07/13/2012    Procedure: EXPLORATORY LAPAROTOMY repair gastric leak;  Surgeon: Mariella Saa, MD;  [facility];  Service: General;  Laterality: N/A;  . Laparotomy N/A 11/11/2012    Procedure: EXPLORATORY LAPAROTOMY;  Surgeon: [name]. [name], MD;  [facility]: [facility];  Service: General;  Laterality: N/A;   . Bowel resection N/A 11/11/2012    Procedure: SMALL BOWEL RESECTION;  Surgeon: [name]. [name], MD;  [facility];  Service: General;  Laterality: N/A;  . Minor application of wound vac N/A 11/11/2012    Procedure: APPLICATION OF WOUND VAC;  Surgeon: [name]. [name], MD;  [facility];  Service: General;  Laterality: N/A;  . Jejunostomy N/A 11/11/2012    Procedure: PLACEMENT OF FEEDING JEJUNOSTOMY TUBE;  Surgeon: [name]. [name], MD;  [facility];  Service: General;  Laterality: N/A;  . Lysis of adhesion N/A 11/11/2012    Procedure: LYSIS OF ADHESION;  Surgeon: [name]. [name], MD;  [facility];  Service: General;  Laterality: N/A;  . Hernia repair     Review of Systems  Constitutional: Positive for appetite change and fatigue. Negative for fever.  Respiratory: Negative for cough, chest tightness and shortness of breath.   Cardiovascular: Negative for chest pain.  Gastrointestinal: Positive for nausea and abdominal pain. Negative for vomiting.  Genitourinary: Negative for difficulty urinating.  All other systems reviewed and are negative.    Objective:   Physical Exam  Constitutional: She appears distressed.  In pain  Cardiovascular: Normal rate and regular rhythm.   No murmur heard. Pulmonary/Chest: Effort normal.  Abdominal:    Abd wall = around the gastrocutaneous fistula is now very red, swollen, indurated, and tender to touch. This is much more swollen and indurated than at her office visit 02/25/2013. No pus expressible from gcfistula. No fluctuance or subq emphysema. Midline incision still open. gastrostomy in place.    Results for orders placed in visit on 03/02/13  POCT CBC      Result Value Range   WBC 13.2 (*) 4.6 - 10.2 K/uL   Lymph, poc 2.0  0.6 - 3.4   POC LYMPH PERCENT 15.3  10 - 50 %L   MID (cbc) 0.6  0 - 0.9   POC MID % 4.6  0 - 12 %M   POC Granulocyte 10.6 (*) 2 - 6.9   Granulocyte percent 80.1 (*) 37 - 80 %G   RBC 4.53  4.04 - 5.48 M/uL    Hemoglobin 11.6 (*) 12.2 - 16.2 g/dL   HCT, POC 40.9 (*) 81.1 - 47.9 %   MCV 81.6  80 - 97 fL   MCH, POC 25.6 (*) 27 - 31.2 pg   MCHC 31.4 (*) 31.8 - 35.4 g/dL   RDW, POC 91.4     Platelet Count, POC 416  142 - 424 K/uL   MPV 8.3  0 - 99.8 fL   Triage Vitals:BP 116/60  Pulse 110  Temp(Src) 99.8 F (37.7 C) (Oral)  Resp 16  Ht 5' 5.25" (1.657 m)  Wt 110 lb (49.896 kg)  BMI 18.17 kg/m2  SpO2 97% Assessment & Plan:  Abdominal pain, unspecified site - Plan: POCT CBC  Gastrocutaneous fistula with surrounding cellulitis.  Discussed with Dr Shawnie Dapper at [facility] who agreed imaging and evaluation for admission necessary--she is resistant to rehospitalization/agrees to er visit for eval tho will not go til am  PTE-completing anticoag--will decrease to 5mg  daily /with hospitalization possible will not d/c until discharged/not  bedridden.

## 2013-03-02 NOTE — Telephone Encounter (Signed)
Let then know I decreased to 5 daily and she'll be going to [facility] tonight or in am

## 2013-03-03 NOTE — Telephone Encounter (Signed)
Called Erie Noe and gave her info about coumadin dose and status of pt.

## 2013-03-03 NOTE — Telephone Encounter (Signed)
Did you get Erie Noe phone number? I was going to call, but no number given

## 2013-03-10 ENCOUNTER — Telehealth: Payer: Self-pay

## 2013-03-10 NOTE — Telephone Encounter (Signed)
Cynthia Bowen from [facility] care called w/pt's PT/INR results today, PT 17.1, INR 1.4. Pt was changed at OV last week to 5 mg QD. Dr Merla Riches, please advise coumadin dose and when you want next PT/INR test.

## 2013-03-10 NOTE — Telephone Encounter (Signed)
Ok to stay at current dose and recheck INR next tuesday

## 2013-03-10 NOTE — Telephone Encounter (Signed)
Called and advised Erie Noe of Dr [name]'s instr's. She agreed.

## 2013-03-11 NOTE — ED Provider Notes (Signed)
Medical screening examination/treatment/procedure(s) were conducted as a shared visit with non-physician practitioner(s) and myself.  I personally evaluated the patient during the encounter.  EKG: nsr, no acute ischemic changes, normal intervals, normal QRS, no ST T segments.   Please see separate note documenting my direct encounter with the patient.    Brandt Loosen, MD 03/11/13 [phone]

## 2013-03-17 ENCOUNTER — Telehealth: Payer: Self-pay

## 2013-03-17 NOTE — Telephone Encounter (Signed)
Stay same dose and continue Tuesday INR

## 2013-03-17 NOTE — Telephone Encounter (Signed)
Cynthia Berkshire, RN from [facility] called to report pt's INR is 1.5 today. Pt has been taking coumadin 5 mg QD. Dr Merla Riches, please advise dosage and when next INR check.

## 2013-03-18 ENCOUNTER — Telehealth: Payer: Self-pay

## 2013-03-18 NOTE — Telephone Encounter (Signed)
Thanks. I have called to advise. Spoke to [place].

## 2013-03-18 NOTE — Telephone Encounter (Signed)
It does not look like we are prescribing this for her, called pharmacy asked to send to the physician who prescribed for her. The pharmacist indicates they do not have the Rx, patient took it with her, pharmacy will contact her and see what needs to be done to get her this medication.

## 2013-03-18 NOTE — Telephone Encounter (Signed)
PT STATES SHE NEED A PRIOR AUTHORIZATION FOR HER ZOFRAN. PLEASE CALL [phone]   [facility] [address]

## 2013-03-19 ENCOUNTER — Emergency Department (HOSPITAL_COMMUNITY)
Admission: EM | Admit: 2013-03-19 | Discharge: 2013-03-19 | Disposition: A | Discharge status: Other Acute Inpatient Hospital | Payer: Medicare Other | Attending: Emergency Medicine | Admitting: Emergency Medicine

## 2013-03-19 ENCOUNTER — Telehealth: Payer: Self-pay | Admitting: Family Medicine

## 2013-03-19 ENCOUNTER — Emergency Department (HOSPITAL_COMMUNITY): Payer: Medicare Other

## 2013-03-19 DIAGNOSIS — J4489 Other specified chronic obstructive pulmonary disease: Secondary | ICD-10-CM | POA: Insufficient documentation

## 2013-03-19 DIAGNOSIS — F3289 Other specified depressive episodes: Secondary | ICD-10-CM | POA: Insufficient documentation

## 2013-03-19 DIAGNOSIS — Z7901 Long term (current) use of anticoagulants: Secondary | ICD-10-CM | POA: Insufficient documentation

## 2013-03-19 DIAGNOSIS — IMO0001 Reserved for inherently not codable concepts without codable children: Secondary | ICD-10-CM | POA: Insufficient documentation

## 2013-03-19 DIAGNOSIS — K219 Gastro-esophageal reflux disease without esophagitis: Secondary | ICD-10-CM | POA: Insufficient documentation

## 2013-03-19 DIAGNOSIS — F329 Major depressive disorder, single episode, unspecified: Secondary | ICD-10-CM | POA: Insufficient documentation

## 2013-03-19 DIAGNOSIS — M1990 Unspecified osteoarthritis, unspecified site: Secondary | ICD-10-CM | POA: Insufficient documentation

## 2013-03-19 DIAGNOSIS — J449 Chronic obstructive pulmonary disease, unspecified: Secondary | ICD-10-CM | POA: Insufficient documentation

## 2013-03-19 DIAGNOSIS — L02219 Cutaneous abscess of trunk, unspecified: Secondary | ICD-10-CM | POA: Insufficient documentation

## 2013-03-19 DIAGNOSIS — Z8701 Personal history of pneumonia (recurrent): Secondary | ICD-10-CM | POA: Insufficient documentation

## 2013-03-19 DIAGNOSIS — D649 Anemia, unspecified: Secondary | ICD-10-CM | POA: Insufficient documentation

## 2013-03-19 DIAGNOSIS — Z9089 Acquired absence of other organs: Secondary | ICD-10-CM | POA: Insufficient documentation

## 2013-03-19 DIAGNOSIS — Z8669 Personal history of other diseases of the nervous system and sense organs: Secondary | ICD-10-CM | POA: Insufficient documentation

## 2013-03-19 DIAGNOSIS — F411 Generalized anxiety disorder: Secondary | ICD-10-CM | POA: Insufficient documentation

## 2013-03-19 DIAGNOSIS — Z87891 Personal history of nicotine dependence: Secondary | ICD-10-CM | POA: Insufficient documentation

## 2013-03-19 DIAGNOSIS — Z931 Gastrostomy status: Secondary | ICD-10-CM | POA: Insufficient documentation

## 2013-03-19 DIAGNOSIS — Z79899 Other long term (current) drug therapy: Secondary | ICD-10-CM | POA: Insufficient documentation

## 2013-03-19 DIAGNOSIS — L02211 Cutaneous abscess of abdominal wall: Secondary | ICD-10-CM | POA: Insufficient documentation

## 2013-03-19 DIAGNOSIS — M810 Age-related osteoporosis without current pathological fracture: Secondary | ICD-10-CM | POA: Insufficient documentation

## 2013-03-19 LAB — POCT I-STAT, CHEM 8
Calcium, Ion: 1.18 mmol/L (ref 1.13–1.30)
Chloride: 102 mEq/L (ref 96–112)
Creatinine, Ser: 0.8 mg/dL (ref 0.50–1.10)
Glucose, Bld: 102 mg/dL — ABNORMAL HIGH (ref 70–99)
HCT: 36 % (ref 36.0–46.0)
Hemoglobin: 12.2 g/dL (ref 12.0–15.0)
Potassium: 3.9 mEq/L (ref 3.5–5.1)
Sodium: 138 mEq/L (ref 135–145)

## 2013-03-19 LAB — CBC WITH DIFFERENTIAL/PLATELET
Basophils Relative: 0 % (ref 0–1)
Eosinophils Absolute: 0.1 10*3/uL (ref 0.0–0.7)
HCT: 32.9 % — ABNORMAL LOW (ref 36.0–46.0)
Hemoglobin: 11.5 g/dL — ABNORMAL LOW (ref 12.0–15.0)
Lymphocytes Relative: 11 % — ABNORMAL LOW (ref 12–46)
Lymphs Abs: 1.3 10*3/uL (ref 0.7–4.0)
MCH: 26.4 pg (ref 26.0–34.0)
MCHC: 35 g/dL (ref 30.0–36.0)
Monocytes Absolute: 0.9 10*3/uL (ref 0.1–1.0)
Monocytes Relative: 8 % (ref 3–12)
Neutro Abs: 9.2 10*3/uL — ABNORMAL HIGH (ref 1.7–7.7)
Platelets: 414 10*3/uL — ABNORMAL HIGH (ref 150–400)
RBC: 4.35 MIL/uL (ref 3.87–5.11)

## 2013-03-19 MED ORDER — ONDANSETRON HCL 4 MG/2ML IJ SOLN
4.0000 mg | Freq: Once | INTRAMUSCULAR | Status: AC
  Administered 2013-03-19: 4 mg via INTRAVENOUS
  Filled 2013-03-19: qty 2

## 2013-03-19 MED ORDER — HYDROMORPHONE HCL PF 1 MG/ML IJ SOLN
1.0000 mg | Freq: Once | INTRAMUSCULAR | Status: AC
  Administered 2013-03-19: 1 mg via INTRAVENOUS
  Filled 2013-03-19: qty 1

## 2013-03-19 MED ORDER — IOHEXOL 300 MG/ML  SOLN
100.0000 mL | Freq: Once | INTRAMUSCULAR | Status: AC | PRN
  Administered 2013-03-19: 100 mL via INTRAVENOUS

## 2013-03-19 MED ORDER — MORPHINE SULFATE 4 MG/ML IJ SOLN
4.0000 mg | Freq: Once | INTRAMUSCULAR | Status: AC
  Administered 2013-03-19: 4 mg via INTRAVENOUS
  Filled 2013-03-19: qty 1

## 2013-03-19 MED ORDER — IOHEXOL 300 MG/ML  SOLN
20.0000 mL | INTRAMUSCULAR | Status: AC
  Administered 2013-03-19: 20 mL via ORAL

## 2013-03-19 MED ORDER — SODIUM CHLORIDE 0.9 % IV BOLUS (SEPSIS)
500.0000 mL | Freq: Once | INTRAVENOUS | Status: AC
  Administered 2013-03-19: 500 mL via INTRAVENOUS

## 2013-03-19 MED ORDER — SODIUM CHLORIDE 0.9 % IV SOLN
INTRAVENOUS | Status: DC
  Administered 2013-03-19: 13:00:00 via INTRAVENOUS

## 2013-03-19 NOTE — ED Notes (Signed)
Pt reports right sided abd pain x 3 days with diarrhea since last night. Recent fistula repair. Advised to come here by MD Merla Riches.

## 2013-03-19 NOTE — ED Notes (Signed)
Md Rubin Payor made aware of pts BP of 100/69. 500 bolus to be given.

## 2013-03-19 NOTE — ED Notes (Signed)
Contacted CT about wait time. Pt is bringing pt's contrast.

## 2013-03-19 NOTE — ED Notes (Signed)
Contacted CT x 2 about wait time. Apologized to pt about delay. Pt resting comfortably in bed. Family at bedside. VSS. Pain improved.

## 2013-03-19 NOTE — ED Notes (Signed)
Pt has large reddened area to RLQ. Pt reports decrease in drainage. States the area remains same in size but increasing firmness. Tender on palpation. Area is red, warm to touch. Pt has been evaluated for same, tx for cellulitis.

## 2013-03-19 NOTE — Telephone Encounter (Signed)
Erie Noe (nurse) [facility] called ragarding severe abd pain and R side of abd incision is not draining. She spoke with Dr. Merla Riches and he advised she needed to go to [facility] ER and have CT. She voiced understanding.

## 2013-03-19 NOTE — ED Notes (Signed)
Md Pickering at bedside.  

## 2013-03-19 NOTE — ED Provider Notes (Signed)
CSN: [id number]     Arrival date & time 03/19/13  1148 History   First MD Initiated Contact with Patient 03/19/13 1202     Chief Complaint  Patient presents with  . Abdominal Pain   (Consider location/radiation/quality/duration/timing/severity/associated sxs/prior Treatment) Patient is a 67 y.o. female presenting with abdominal pain. The history is provided by the patient.  Abdominal Pain Associated symptoms: no chest pain, no diarrhea, no nausea, no shortness of breath and no vomiting   patient has had increased pain in her right abdomen. She has a history of fistula and cellulitis. She has been seen both at [facility] and now primarily at [facility]. She has an appointment with her surgeon on 12/3. She states over the last 3 days her right abdomen has become firmer and more painful. She states it is always somewhat enlarged. No fevers. She takes her feedings through a g-tube. No nausea or vomiting. No change in her bowel habits. She states that she was told to come in by her PCP. She finished abx about 7 days ago. She states that there has been an increase in the drainage in her mid abdominal wound.   Past Medical History  Diagnosis Date  . COPD (chronic obstructive pulmonary disease)   . Pneumonia 12-2011  . GERD (gastroesophageal reflux disease)   . Headache(784.0)   . Arthritis     osteoarthritis  . Allergy   . Depression   . Neuromuscular disorder   . Osteoporosis   . Bronchitis     CURRENTLY AS OF 06/30/12 - HAS COUGH AND FINISHED ANTIBIOTIC FOR BRONCHITIS  . Fibromyalgia   . Pain     ABDOMINAL PAIN AND NAUSEA  . Pain     SOMETIMES PAIN RIGHT EAR AND NECK--STATES CAUSED BY A "LUMP" ON BACK OF EAR--USES KENALOG CREAM TOPICALLY AS NEEDED.  Marland Kitchen Gastrocutaneous fistula   . Anemia   . Anxiety   . Blood transfusion without reported diagnosis    Past Surgical History  Procedure Laterality Date  . Abdominal hysterectomy    . Esophagogastroduodenoscopy  04/18/2012     Procedure: ESOPHAGOGASTRODUODENOSCOPY (EGD);  Surgeon: [name], MD;  Location: [facility];  Service: Endoscopy;  Laterality: N/A;  . Eus  05/29/2012    Procedure: UPPER ENDOSCOPIC ULTRASOUND (EUS) LINEAR;  Surgeon: [name], MD;  [facility];  Service: Endoscopy;  Laterality: N/A;  . Appendectomy    . Spine surgery      CERVICAL SPINE SURGERY X 2 - INCLUDING FUSION; LUMBAR SURGERY FOR RUPTURED DISC  . Eye surgery      BILATERAL CATARACT EXTRACTIONS  . Laparoscopic abdominal exploration N/A 07/02/2012    Procedure: converted to laparotomy with gastric biopsy;  Surgeon: [name]. [name], MD;  Location: [facility];  Service: General;  Laterality: N/A;  Laparoscopic Gastric Biospy attempted.   . Laparotomy N/A 07/07/2012    Procedure: EXPLORATORY LAPAROTOMY repair of gastric perforation with omental graham patch, drainage of abdominal abcess;  Surgeon: [name]. [name], MD;  Location: [facility];  Service: General;  Laterality: N/A;  . Stomach surgery  07/07/2012    Omental patch of gastric perforation  . Laparotomy N/A 07/13/2012    Procedure: EXPLORATORY LAPAROTOMY repair gastric leak;  Surgeon: Mariella Saa, MD;  [facility]: [facility];  Service: General;  Laterality: N/A;  . Laparotomy N/A 11/11/2012    Procedure: EXPLORATORY LAPAROTOMY;  Surgeon: [name]. [name], MD;  [facility];  Service: General;  Laterality: N/A;  . Bowel  resection N/A 11/11/2012    Procedure: SMALL BOWEL RESECTION;  Surgeon: [name]. [name], MD;  [facility]: [facility];  Service: General;  Laterality: N/A;  . Minor application of wound vac N/A 11/11/2012    Procedure: APPLICATION OF WOUND VAC;  Surgeon: [name]. [name], MD;  [facility]: [facility];  Service: General;  Laterality: N/A;  . Jejunostomy N/A 11/11/2012    Procedure: PLACEMENT OF FEEDING JEJUNOSTOMY TUBE;  Surgeon: [name]. [name], MD;  [facility];  Service: General;  Laterality: N/A;  . Lysis of adhesion N/A 11/11/2012    Procedure: LYSIS OF  ADHESION;  Surgeon: [name]. [name], MD;  Location: [facility];  Service: General;  Laterality: N/A;  . Hernia repair     Family History  Problem Relation Age of Onset  . COPD Mother   . Hyperlipidemia Mother   . Hypertension Maternal Grandmother   . Stroke Maternal Grandmother    History  Substance Use Topics  . Smoking status: Former Smoker -- 0.50 packs/day for 35 years    Types: Cigarettes    Start date: 01/04/2003    Quit date: 04/17/2005  . Smokeless tobacco: Never Used  . Alcohol Use: No   OB History   Grav Para Term Preterm Abortions TAB SAB Ect Mult Living                 Review of Systems  Constitutional: Negative for activity change and appetite change.  Eyes: Negative for pain.  Respiratory: Negative for chest tightness and shortness of breath.   Cardiovascular: Negative for chest pain and leg swelling.  Gastrointestinal: Positive for abdominal pain. Negative for nausea, vomiting and diarrhea.  Genitourinary: Negative for flank pain.  Musculoskeletal: Negative for back pain and neck stiffness.  Skin: Positive for color change. Negative for rash.  Neurological: Negative for weakness, numbness and headaches.  Psychiatric/Behavioral: Negative for behavioral problems.    Allergies  Avelox; Betadine; Alendronate sodium; Aspirin; Codeine; Doxycycline; Fluconazole; Hydrocodone; Neurontin; Nsaids; Sertraline hcl; and Sulfa antibiotics  Home Medications   Current Outpatient Rx  Name  Route  Sig  Dispense  Refill  . albuterol (PROVENTIL HFA;VENTOLIN HFA) 108 (90 BASE) MCG/ACT inhaler   Inhalation   Inhale 2 puffs into the lungs every 6 (six) hours as needed for wheezing.         . diphenhydrAMINE (BENADRYL) 12.5 MG/5ML liquid   Oral   Take 12.5 mg by mouth 4 (four) times daily as needed for itching.          Marland Kitchen EPINEPHrine (EPIPEN) 0.3 mg/0.3 mL SOAJ   Intramuscular   Inject 0.3 mg into the muscle daily as needed (anaphylaxis).         . fentaNYL (DURAGESIC  - DOSED MCG/HR) 50 MCG/HR   Transdermal   Place 2 patches onto the skin every 3 (three) days.         Marland Kitchen FLUoxetine (PROZAC) 20 MG/5ML solution   Feeding Tube   20 mg by Feeding Tube route daily.         . hydrocortisone 2.5 % cream   Topical   Apply topically 2 (two) times daily.   30 g   0   . morphine 10 MG/5ML solution   Tube   Give 10 mg by tube every 3 (three) hours as needed for pain.         Marland Kitchen ondansetron (ZOFRAN-ODT) 4 MG disintegrating tablet   Oral   Take 4 mg by mouth 2 (two) times daily  as needed for nausea.          . pantoprazole sodium (PROTONIX) 40 mg/20 mL PACK   Feeding Tube   40 mg by Feeding Tube route daily.         . ranitidine (ZANTAC) 150 MG/10ML syrup   Tube   Give 150 mg by tube daily.         Marland Kitchen warfarin (COUMADIN) 5 MG tablet   Oral   Take 5 mg by mouth daily.          BP 121/69  Pulse 79  Temp(Src) 98.5 F (36.9 C) (Oral)  Resp 16  SpO2 99% Physical Exam  Constitutional: She appears well-developed and well-nourished.  HENT:  Head: Normocephalic.  Cardiovascular: Normal rate and regular rhythm.   Pulmonary/Chest: Effort normal.  Abdominal: She exhibits mass. There is tenderness.  g tube left abdomen. Packed midline wound with some white drainage. Right mid abdomen mass with erethema and induration. No drainage.     ED Course  Procedures (including critical care time) Labs Review Labs Reviewed  CBC WITH DIFFERENTIAL - Abnormal; Notable for the following:    WBC 11.5 (*)    Hemoglobin 11.5 (*)    HCT 32.9 (*)    MCV 75.6 (*)    RDW 17.2 (*)    Platelets 414 (*)    Neutrophils Relative % 80 (*)    Neutro Abs 9.2 (*)    Lymphocytes Relative 11 (*)    All other components within normal limits  POCT I-STAT, CHEM 8 - Abnormal; Notable for the following:    BUN 24 (*)    Glucose, Bld 102 (*)    All other components within normal limits   Imaging Review No results found.  EKG Interpretation   None        MDM   1. Cellulitis and abscess of trunk    Patient with known gastrocutaneous fistula. Increasing pain on right abdomen with enlarging mass. CT shows fluid collection has enlarged since 2 weeks ago. Discussed with Dr. Christy Sartorius at [facility]. He requests transfer to the ED for likely drainage of the abscess. Imaging will be sent with the patient.    Juliet Rude. Rubin Payor, MD 03/19/13 1554

## 2013-03-19 NOTE — Telephone Encounter (Signed)
Patient called and spoke with Dr. Merla Riches. Fistula has stopped draining there has been increase abd pain x 3 days. Dr. Merla Riches advised her to go to [facility] ER and CT scan.

## 2013-03-27 NOTE — Telephone Encounter (Signed)
Spoke with patient and she is not sure which doctor prescribed her the zofran.  She is going to try and find the bottle and call us back or contact the physician who prescribed it.

## 2013-03-27 NOTE — Telephone Encounter (Signed)
Pharmacy told patient to call us for refills on ondansetron (ZOFRAN-ODT) 4 MG disintegrating tablet   [facility] [address] [phone]

## 2013-03-30 ENCOUNTER — Telehealth: Payer: Self-pay | Admitting: Radiology

## 2013-03-30 NOTE — Telephone Encounter (Signed)
Patient is on Coumadin 7.5 mg per day. INR is 1.9 today. Advised nurse with [facility] to continue current dose per Dr Merla Riches and call me back next week with the results.

## 2013-04-03 ENCOUNTER — Telehealth (INDEPENDENT_AMBULATORY_CARE_PROVIDER_SITE_OTHER): Payer: Self-pay

## 2013-04-03 NOTE — Telephone Encounter (Signed)
[facility]/Pattie Andrey Campanile states Cynthia Bowen had call this am ask or order for PRN dressing change. Abscess/J.P drain with skin irrigation per patient . Advised [facility]  DR. Tsuei is not available . She may need to call [facility] /DR.Westcott or DR. Lily Peer for order.  To call back if no results

## 2013-04-09 ENCOUNTER — Telehealth: Payer: Self-pay

## 2013-04-09 NOTE — Telephone Encounter (Signed)
Erie Noe stated another RN was sent to see pt earlier this week and did not get INR. Today INR 1.7, PT 20.6. Pt is taking 7.5 mg QD.  Erie Noe believes there may be some vit K in the tube feeding that pt is receiving that contributes to low INRs. Erie Noe reports that pt is having major surgery for intest fistula on 04/15/13 and there are no orders to stop lovenox/coumadin. Surgeon is Dr Lorin Picket [phone]. Dr Merla Riches pls advise. Let me know if you need for me to get in touch w/surgeon's office.

## 2013-04-09 NOTE — Telephone Encounter (Signed)
Thanks--tell ms Iten she can stop the coumadin in preparation for surgery, and because her pulm embolus has been treated long enough.... and I'll call the surgeon tomorrow.

## 2013-04-09 NOTE — Telephone Encounter (Signed)
Rebbeca Paul, RN w/[facility], and gave orders from Dr Merla Riches, advised he will call surgeon. Erie Noe stated she will call the pt now to make sure she understands what to do.

## 2013-05-05 ENCOUNTER — Telehealth: Payer: Self-pay

## 2013-05-05 NOTE — Telephone Encounter (Signed)
Corie Chiquito with [facility] called for patient's PT/INR results.  Advised him we do not have results.  He wanted to know if Dr. Laney Pastor was still managing patient's coumadin?  I stated I didn't know but would check with Dr. Laney Pastor.  Please advised Rick's number is [phone]

## 2013-05-05 NOTE — Telephone Encounter (Signed)
There are forms scanned in Epic that were signed by [name]. Is he talking about those?

## 2013-05-05 NOTE — Telephone Encounter (Signed)
Please call the patient and see who is following her coumadin use.

## 2013-05-05 NOTE — Telephone Encounter (Signed)
[facility] is calling to request status of form faxed to [name] at the first of December.   Fax#: [phone] Ph#:  [phone]  Ext [phone]

## 2013-05-05 NOTE — Telephone Encounter (Signed)
Spoke with patient and she states [facility] takes [facility] blood and calls Dr. Laney Pastor with results

## 2013-05-06 NOTE — Telephone Encounter (Signed)
So there are forms that are in media tab for [facility] to continue coumadin checks - please make sure these get to [facility] - maybe print and fax? Or do they have access to epic to view?

## 2013-05-06 NOTE — Telephone Encounter (Signed)
Yes - lets call [facility] and talk to them about weekly PT/INR until we know stable for a week and then we can change to monthly.

## 2013-05-06 NOTE — Telephone Encounter (Signed)
The order in the media tab states to check INR on 03/16/13 and report results to Dr. Laney Pastor. Shouldn't we send in an open standing order for INR checks?

## 2013-05-07 NOTE — Telephone Encounter (Signed)
LM for case worker.

## 2013-05-11 NOTE — Telephone Encounter (Signed)
Spoke with case worker and advised of Cynthia Bowen note

## 2013-05-20 ENCOUNTER — Other Ambulatory Visit: Payer: Self-pay | Admitting: Internal Medicine

## 2013-05-20 NOTE — Telephone Encounter (Signed)
[name], I don't see when you Rxd this for pt in the past. Do you want to give her RFs?

## 2013-05-25 ENCOUNTER — Other Ambulatory Visit (HOSPITAL_COMMUNITY): Payer: Self-pay | Admitting: Surgery

## 2013-05-27 ENCOUNTER — Other Ambulatory Visit (HOSPITAL_COMMUNITY): Payer: Self-pay | Admitting: Surgery

## 2013-05-27 ENCOUNTER — Telehealth: Payer: Self-pay

## 2013-05-27 NOTE — Telephone Encounter (Signed)
She has completed 6 months coumadin since pulm emb and may now discontinue coumadin (Unless bedridden)-she should be recovered fromlast surgery now that he has released her

## 2013-05-27 NOTE — Telephone Encounter (Signed)
Cynthia Bowen from [facility] called to report that [name] is no longer managing coumadin and asked pt to go back to provider who managed before. [name], PT/INR was done yesterday and INR  was 1.9 and [name] instr'd her to stay on same dose until [name] reviews. Pt has been taking 7.5 mg QD (although today only had a 5 mg tab and took that). Pt needs new Rx for coumadin (last on 2.5 mg, 3 tabs QD) and Cynthia Bowen needs order for next PT/INR. She is going back to see pt next Wed. Is that soon enough or do you want another test before that? [name]'s # is [phone]

## 2013-05-28 NOTE — Telephone Encounter (Signed)
Notified Larena Glassman and pt that she may DC coumadin. Pt/RN report she is not bedridden and is getting around pretty well. Pt will double check w/Dr Garner Nash to make sure he doesn't want to cont coumadin from recent surgery.

## 2013-06-04 ENCOUNTER — Emergency Department (HOSPITAL_COMMUNITY): Payer: Medicare Other

## 2013-06-04 ENCOUNTER — Emergency Department (HOSPITAL_COMMUNITY)
Admission: EM | Admit: 2013-06-04 | Discharge: 2013-06-04 | Disposition: A | Discharge status: Home / Self Care | Payer: Medicare Other | Attending: Emergency Medicine | Admitting: Emergency Medicine

## 2013-06-04 ENCOUNTER — Encounter (HOSPITAL_COMMUNITY): Payer: Self-pay | Admitting: Emergency Medicine

## 2013-06-04 DIAGNOSIS — R5383 Other fatigue: Secondary | ICD-10-CM

## 2013-06-04 DIAGNOSIS — Z90710 Acquired absence of both cervix and uterus: Secondary | ICD-10-CM | POA: Insufficient documentation

## 2013-06-04 DIAGNOSIS — Z9889 Other specified postprocedural states: Secondary | ICD-10-CM | POA: Insufficient documentation

## 2013-06-04 DIAGNOSIS — Z862 Personal history of diseases of the blood and blood-forming organs and certain disorders involving the immune mechanism: Secondary | ICD-10-CM | POA: Insufficient documentation

## 2013-06-04 DIAGNOSIS — R110 Nausea: Secondary | ICD-10-CM | POA: Insufficient documentation

## 2013-06-04 DIAGNOSIS — J449 Chronic obstructive pulmonary disease, unspecified: Secondary | ICD-10-CM | POA: Insufficient documentation

## 2013-06-04 DIAGNOSIS — Z8701 Personal history of pneumonia (recurrent): Secondary | ICD-10-CM | POA: Insufficient documentation

## 2013-06-04 DIAGNOSIS — Z934 Other artificial openings of gastrointestinal tract status: Secondary | ICD-10-CM | POA: Insufficient documentation

## 2013-06-04 DIAGNOSIS — Z8669 Personal history of other diseases of the nervous system and sense organs: Secondary | ICD-10-CM | POA: Insufficient documentation

## 2013-06-04 DIAGNOSIS — J4489 Other specified chronic obstructive pulmonary disease: Secondary | ICD-10-CM | POA: Insufficient documentation

## 2013-06-04 DIAGNOSIS — Z8739 Personal history of other diseases of the musculoskeletal system and connective tissue: Secondary | ICD-10-CM | POA: Insufficient documentation

## 2013-06-04 DIAGNOSIS — Z9089 Acquired absence of other organs: Secondary | ICD-10-CM | POA: Insufficient documentation

## 2013-06-04 DIAGNOSIS — R1012 Left upper quadrant pain: Secondary | ICD-10-CM | POA: Insufficient documentation

## 2013-06-04 DIAGNOSIS — R5381 Other malaise: Secondary | ICD-10-CM | POA: Insufficient documentation

## 2013-06-04 DIAGNOSIS — Z8719 Personal history of other diseases of the digestive system: Secondary | ICD-10-CM | POA: Insufficient documentation

## 2013-06-04 DIAGNOSIS — Z79899 Other long term (current) drug therapy: Secondary | ICD-10-CM | POA: Insufficient documentation

## 2013-06-04 DIAGNOSIS — Z8659 Personal history of other mental and behavioral disorders: Secondary | ICD-10-CM | POA: Insufficient documentation

## 2013-06-04 DIAGNOSIS — M1990 Unspecified osteoarthritis, unspecified site: Secondary | ICD-10-CM | POA: Insufficient documentation

## 2013-06-04 DIAGNOSIS — R109 Unspecified abdominal pain: Secondary | ICD-10-CM

## 2013-06-04 DIAGNOSIS — Z87891 Personal history of nicotine dependence: Secondary | ICD-10-CM | POA: Insufficient documentation

## 2013-06-04 LAB — COMPREHENSIVE METABOLIC PANEL
ALBUMIN: 3.3 g/dL — AB (ref 3.5–5.2)
ALK PHOS: 94 U/L (ref 39–117)
ALT: 13 U/L (ref 0–35)
AST: 20 U/L (ref 0–37)
BUN: 11 mg/dL (ref 6–23)
CO2: 24 mEq/L (ref 19–32)
Calcium: 9.3 mg/dL (ref 8.4–10.5)
Chloride: 101 mEq/L (ref 96–112)
Creatinine, Ser: 0.56 mg/dL (ref 0.50–1.10)
GFR calc Af Amer: >90 mL/min (ref >90)
GFR calc non Af Amer: >90 mL/min (ref >90)
Glucose, Bld: 86 mg/dL (ref 70–99)
Potassium: 3.9 mEq/L (ref 3.7–5.3)
Sodium: 138 mEq/L (ref 137–147)
TOTAL PROTEIN: 8 g/dL (ref 6.0–8.3)
Total Bilirubin: 0.3 mg/dL (ref 0.3–1.2)

## 2013-06-04 LAB — CBC WITH DIFFERENTIAL/PLATELET
Basophils Absolute: 0 10*3/uL (ref 0.0–0.1)
Basophils Relative: 0 % (ref 0–1)
EOS ABS: 0.1 10*3/uL (ref 0.0–0.7)
Eosinophils Relative: 1 % (ref 0–5)
HEMATOCRIT: 34.8 % — AB (ref 36.0–46.0)
Hemoglobin: 12.1 g/dL (ref 12.0–15.0)
Lymphocytes Relative: 29 % (ref 12–46)
Lymphs Abs: 2 10*3/uL (ref 0.7–4.0)
MCH: 26.3 pg (ref 26.0–34.0)
MCHC: 34.8 g/dL (ref 30.0–36.0)
MCV: 75.7 fL — AB (ref 78.0–100.0)
Monocytes Absolute: 0.4 10*3/uL (ref 0.1–1.0)
Monocytes Relative: 6 % (ref 3–12)
NEUTROS ABS: 4.4 10*3/uL (ref 1.7–7.7)
Neutrophils Relative %: 64 % (ref 43–77)
Platelets: 371 10*3/uL (ref 150–400)
RBC: 4.6 MIL/uL (ref 3.87–5.11)
RDW: 18.5 % — ABNORMAL HIGH (ref 11.5–15.5)
WBC: 6.9 10*3/uL (ref 4.0–10.5)

## 2013-06-04 LAB — URINALYSIS, ROUTINE W REFLEX MICROSCOPIC
Bilirubin Urine: NEGATIVE
GLUCOSE, UA: NEGATIVE mg/dL
KETONES UR: NEGATIVE mg/dL
Nitrite: NEGATIVE
PH: 7 (ref 5.0–8.0)
Protein, ur: NEGATIVE mg/dL
Specific Gravity, Urine: 1.013 (ref 1.005–1.030)
Urobilinogen, UA: 1 mg/dL (ref 0.0–1.0)

## 2013-06-04 LAB — URINE MICROSCOPIC-ADD ON

## 2013-06-04 LAB — LIPASE, BLOOD: Lipase: 28 U/L (ref 11–59)

## 2013-06-04 MED ORDER — SODIUM CHLORIDE 0.9 % IV BOLUS (SEPSIS)
1000.0000 mL | Freq: Once | INTRAVENOUS | Status: AC
  Administered 2013-06-04: 1000 mL via INTRAVENOUS

## 2013-06-04 MED ORDER — HYDROCODONE-ACETAMINOPHEN 5-325 MG PO TABS: 1.0000 | ORAL_TABLET | Freq: Four times a day (QID) | ORAL | Status: DC | PRN

## 2013-06-04 MED ORDER — IOHEXOL 300 MG/ML  SOLN
50.0000 mL | Freq: Once | INTRAMUSCULAR | Status: AC | PRN
  Administered 2013-06-04: 80 mL via INTRAVENOUS

## 2013-06-04 MED ORDER — HYDROMORPHONE HCL PF 1 MG/ML IJ SOLN
1.0000 mg | Freq: Once | INTRAMUSCULAR | Status: AC
  Administered 2013-06-04: 1 mg via INTRAVENOUS
  Filled 2013-06-04: qty 1

## 2013-06-04 MED ORDER — SODIUM CHLORIDE 0.9 % IV SOLN
INTRAVENOUS | Status: DC
Start: 2013-06-05 — End: 2013-06-05

## 2013-06-04 MED ORDER — IOHEXOL 300 MG/ML  SOLN
50.0000 mL | Freq: Once | INTRAMUSCULAR | Status: AC | PRN
  Administered 2013-06-04: 50 mL via ORAL

## 2013-06-04 MED ORDER — ONDANSETRON 4 MG PO TBDP: 4.0000 mg | ORAL_TABLET | Freq: Three times a day (TID) | ORAL | Status: DC | PRN

## 2013-06-04 MED ORDER — ONDANSETRON HCL 4 MG/2ML IJ SOLN
4.0000 mg | Freq: Once | INTRAMUSCULAR | Status: AC
  Administered 2013-06-04: 4 mg via INTRAVENOUS
  Filled 2013-06-04: qty 2

## 2013-06-04 NOTE — ED Notes (Signed)
Bed: EM75 Expected date:  Expected time:  Means of arrival:  Comments: ems- F, abdominal pain

## 2013-06-04 NOTE — ED Notes (Addendum)
Per ems pt reports abdominal surgery on 1/24 at [facility], has a drainage tube in place. Pt ran out of pain meds and nausea meds were not helping, on scene ambulatory.   22g R Forearm, 4 mg zofran given

## 2013-06-04 NOTE — ED Notes (Signed)
Patient taken to CT scan.

## 2013-06-04 NOTE — ED Provider Notes (Signed)
CSN: [id number]     Arrival date & time 06/04/13  1735 History   First MD Initiated Contact with Patient 06/04/13 1740     Chief Complaint  Patient presents with  . Abdominal Pain     (Consider location/radiation/quality/duration/timing/severity/associated sxs/prior Treatment) Patient is a 68 y.o. female presenting with abdominal pain. The history is provided by the patient and the EMS personnel.  Abdominal Pain Associated symptoms: fatigue and nausea   Associated symptoms: no chest pain, no dysuria, no fever, no shortness of breath and no vomiting    patient presents with abdominal pain and persistent worsening nausea. No fevers. Bowel movements have been doing okay. No dysuria. Symptoms got worse starting yesterday. Patient status post gastric fistula surgical repair on December 24 at [facility]. Patient's abdominal wound is healing by secondary intention. Patient states that she's always had some abdominal pain slightly worse last 2 days but the nausea has been quite severe. No vomiting. Donald pain is rated as 6/10. However rescue squad treated her with medications on the way in. Pain prior to that was 10 out of 10.  Past Medical History  Diagnosis Date  . COPD (chronic obstructive pulmonary disease)   . Pneumonia 12-2011  . GERD (gastroesophageal reflux disease)   . Headache(784.0)   . Arthritis     osteoarthritis  . Allergy   . Depression   . Neuromuscular disorder   . Osteoporosis   . Bronchitis     CURRENTLY AS OF 06/30/12 - HAS COUGH AND FINISHED ANTIBIOTIC FOR BRONCHITIS  . Fibromyalgia   . Pain     ABDOMINAL PAIN AND NAUSEA  . Pain     SOMETIMES PAIN RIGHT EAR AND NECK--STATES CAUSED BY A "LUMP" ON BACK OF EAR--USES KENALOG CREAM TOPICALLY AS NEEDED.  Marland Kitchen Gastrocutaneous fistula   . Anemia   . Anxiety   . Blood transfusion without reported diagnosis    Past Surgical History  Procedure Laterality Date  . Abdominal hysterectomy    . Esophagogastroduodenoscopy  04/18/2012     Procedure: ESOPHAGOGASTRODUODENOSCOPY (EGD);  Surgeon: Inda Castle, MD;  Location: [facility];  Service: Endoscopy;  Laterality: N/A;  . Eus  05/29/2012    Procedure: UPPER ENDOSCOPIC ULTRASOUND (EUS) LINEAR;  Surgeon: Milus Banister, MD;  Location: [facility];  Service: Endoscopy;  Laterality: N/A;  . Appendectomy    . Spine surgery      CERVICAL SPINE SURGERY X 2 - INCLUDING FUSION; LUMBAR SURGERY FOR RUPTURED DISC  . Eye surgery      BILATERAL CATARACT EXTRACTIONS  . Laparoscopic abdominal exploration N/A 07/02/2012    Procedure: converted to laparotomy with gastric biopsy;  Surgeon: Imogene Burn. Georgette Dover, MD;  Location: [facility];  Service: General;  Laterality: N/A;  Laparoscopic Gastric Biospy attempted.   . Laparotomy N/A 07/07/2012    Procedure: EXPLORATORY LAPAROTOMY repair of gastric perforation with omental graham patch, drainage of abdominal abcess;  Surgeon: Imogene Burn. Georgette Dover, MD;  Location: [facility];  Service: General;  Laterality: N/A;  . Stomach surgery  07/07/2012    Omental patch of gastric perforation  . Laparotomy N/A 07/13/2012    Procedure: EXPLORATORY LAPAROTOMY repair gastric leak;  Surgeon: Edward Jolly, MD;  Location: [facility];  Service: General;  Laterality: N/A;  . Laparotomy N/A 11/11/2012    Procedure: EXPLORATORY LAPAROTOMY;  Surgeon: Imogene Burn. Georgette Dover, MD;  Location: [place];  Service: General;  Laterality: N/A;  . Bowel resection N/A 11/11/2012    Procedure: SMALL BOWEL RESECTION;  Surgeon: Imogene Burn. Georgette Dover, MD;  Location: [place];  Service: General;  Laterality: N/A;  . Minor application of wound vac N/A 11/11/2012    Procedure: APPLICATION OF WOUND VAC;  Surgeon: Imogene Burn. Georgette Dover, MD;  Location: [place];  Service: General;  Laterality: N/A;  . Jejunostomy N/A 11/11/2012    Procedure: PLACEMENT OF FEEDING JEJUNOSTOMY TUBE;  Surgeon: Imogene Burn. Georgette Dover, MD;  Location: [place];  Service: General;  Laterality: N/A;  . Lysis of adhesion N/A 11/11/2012    Procedure: LYSIS OF  ADHESION;  Surgeon: Imogene Burn. Georgette Dover, MD;  Location: [place];  Service: General;  Laterality: N/A;  . Hernia repair    . Gastric fistula repair      05/16/2013   Family History  Problem Relation Age of Onset  . COPD Mother   . Hyperlipidemia Mother   . Hypertension Maternal Grandmother   . Stroke Maternal Grandmother    History  Substance Use Topics  . Smoking status: Former Smoker -- 0.50 packs/day for 35 years    Types: Cigarettes    Start date: 01/04/2003    Quit date: 04/17/2005  . Smokeless tobacco: Never Used  . Alcohol Use: No   OB History   Grav Para Term Preterm Abortions TAB SAB Ect Mult Living                 Review of Systems  Constitutional: Positive for fatigue. Negative for fever.  HENT: Negative for congestion.   Eyes: Negative for redness.  Respiratory: Negative for shortness of breath.   Cardiovascular: Negative for chest pain.  Gastrointestinal: Positive for nausea and abdominal pain. Negative for vomiting.  Genitourinary: Negative for dysuria.  Musculoskeletal: Negative for back pain.  Skin: Positive for wound. Negative for rash.  Neurological: Negative for headaches.  Hematological: Does not bruise/bleed easily.  Psychiatric/Behavioral: Negative for confusion.      Allergies  Avelox; Betadine; Alendronate sodium; Aspirin; Codeine; Doxycycline; Fluconazole; Hydrocodone; Neurontin; Nsaids; Sertraline hcl; and Sulfa antibiotics  Home Medications   Current Outpatient Rx  Name  Route  Sig  Dispense  Refill  . albuterol (PROVENTIL HFA;VENTOLIN HFA) 108 (90 BASE) MCG/ACT inhaler   Inhalation   Inhale 2 puffs into the lungs every 6 (six) hours as needed for wheezing.         . diphenhydrAMINE (BENADRYL) 12.5 MG/5ML liquid   Oral   Take 12.5 mg by mouth 4 (four) times daily as needed for itching.          Marland Kitchen EPINEPHrine (EPIPEN) 0.3 mg/0.3 mL SOAJ   Intramuscular   Inject 0.3 mg into the muscle daily as needed (anaphylaxis).         .  promethazine (PHENERGAN) 12.5 MG tablet   Oral   Take 12.5 mg by mouth every 6 (six) hours as needed for nausea or vomiting.          Marland Kitchen HYDROcodone-acetaminophen (NORCO/VICODIN) 5-325 MG per tablet   Oral   Take 1-2 tablets by mouth every 6 (six) hours as needed for moderate pain.   20 tablet   0   . ondansetron (ZOFRAN ODT) 4 MG disintegrating tablet   Oral   Take 1 tablet (4 mg total) by mouth every 8 (eight) hours as needed for nausea or vomiting.   20 tablet   0    BP 120/80  Pulse 79  Temp(Src) 97.6 F (36.4 C) (Oral)  Resp 18  SpO2 100% Physical Exam  Nursing note and vitals reviewed. Constitutional:  She is oriented to person, place, and time. She appears well-developed and well-nourished. No distress.  HENT:  Head: Normocephalic and atraumatic.  Mouth/Throat: Oropharynx is clear and moist.  Eyes: Conjunctivae and EOM are normal. Pupils are equal, round, and reactive to light.  Neck: Normal range of motion.  Cardiovascular: Normal rate, regular rhythm and normal heart sounds.   Pulmonary/Chest: Effort normal and breath sounds normal. No respiratory distress.  Abdominal: Soft. Bowel sounds are normal. There is no tenderness.  Patient with feeding tube of left upper quadrant abdominal wound measuring about 5 cm x 3 cm healing by secondary intention. No evidence of secondary infection.  Neurological: She is alert and oriented to person, place, and time. No cranial nerve deficit. She exhibits normal muscle tone. Coordination normal.  Skin: Skin is warm. No rash noted.    ED Course  Procedures (including critical care time) Labs Review Labs Reviewed  COMPREHENSIVE METABOLIC PANEL - Abnormal; Notable for the following:    Albumin 3.3 (*)    All other components within normal limits  CBC WITH DIFFERENTIAL - Abnormal; Notable for the following:    HCT 34.8 (*)    MCV 75.7 (*)    RDW 18.5 (*)    All other components within normal limits  URINALYSIS, ROUTINE W REFLEX  MICROSCOPIC - Abnormal; Notable for the following:    Hgb urine dipstick SMALL (*)    Leukocytes, UA TRACE (*)    All other components within normal limits  LIPASE, BLOOD  URINE MICROSCOPIC-ADD ON   Results for orders placed during the hospital encounter of 06/04/13  COMPREHENSIVE METABOLIC PANEL      Result Value Ref Range   Sodium 138  137 - 147 mEq/L   Potassium 3.9  3.7 - 5.3 mEq/L   Chloride 101  96 - 112 mEq/L   CO2 24  19 - 32 mEq/L   Glucose, Bld 86  70 - 99 mg/dL   BUN 11  6 - 23 mg/dL   Creatinine, Ser 0.56  0.50 - 1.10 mg/dL   Calcium 9.3  8.4 - 10.5 mg/dL   Total Protein 8.0  6.0 - 8.3 g/dL   Albumin 3.3 (*) 3.5 - 5.2 g/dL   AST 20  0 - 37 U/L   ALT 13  0 - 35 U/L   Alkaline Phosphatase 94  39 - 117 U/L   Total Bilirubin 0.3  0.3 - 1.2 mg/dL   GFR calc non Af Amer >90  >90 mL/min   GFR calc Af Amer >90  >90 mL/min  LIPASE, BLOOD      Result Value Ref Range   Lipase 28  11 - 59 U/L  CBC WITH DIFFERENTIAL      Result Value Ref Range   WBC 6.9  4.0 - 10.5 K/uL   RBC 4.60  3.87 - 5.11 MIL/uL   Hemoglobin 12.1  12.0 - 15.0 g/dL   HCT 34.8 (*) 36.0 - 46.0 %   MCV 75.7 (*) 78.0 - 100.0 fL   MCH 26.3  26.0 - 34.0 pg   MCHC 34.8  30.0 - 36.0 g/dL   RDW 18.5 (*) 11.5 - 15.5 %   Platelets 371  150 - 400 K/uL   Neutrophils Relative % 64  43 - 77 %   Lymphocytes Relative 29  12 - 46 %   Monocytes Relative 6  3 - 12 %   Eosinophils Relative 1  0 - 5 %   Basophils Relative 0  0 - 1 %   Neutro Abs 4.4  1.7 - 7.7 K/uL   Lymphs Abs 2.0  0.7 - 4.0 K/uL   Monocytes Absolute 0.4  0.1 - 1.0 K/uL   Eosinophils Absolute 0.1  0.0 - 0.7 K/uL   Basophils Absolute 0.0  0.0 - 0.1 K/uL   RBC Morphology TARGET CELLS     Smear Review LARGE PLATELETS PRESENT    URINALYSIS, ROUTINE W REFLEX MICROSCOPIC      Result Value Ref Range   Color, Urine YELLOW  YELLOW   APPearance CLEAR  CLEAR   Specific Gravity, Urine 1.013  1.005 - 1.030   pH 7.0  5.0 - 8.0   Glucose, UA NEGATIVE   NEGATIVE mg/dL   Hgb urine dipstick SMALL (*) NEGATIVE   Bilirubin Urine NEGATIVE  NEGATIVE   Ketones, ur NEGATIVE  NEGATIVE mg/dL   Protein, ur NEGATIVE  NEGATIVE mg/dL   Urobilinogen, UA 1.0  0.0 - 1.0 mg/dL   Nitrite NEGATIVE  NEGATIVE   Leukocytes, UA TRACE (*) NEGATIVE  URINE MICROSCOPIC-ADD ON      Result Value Ref Range   Squamous Epithelial / LPF RARE  RARE   WBC, UA 0-2  <3 WBC/hpf   RBC / HPF 7-10  <3 RBC/hpf   Bacteria, UA RARE  RARE    Imaging Review Dg Chest 2 View  06/04/2013   CLINICAL DATA:  Cough  EXAM: CHEST  2 VIEW  COMPARISON:  January 12, 2013  FINDINGS: The heart size and mediastinal contours are within normal limits. There is mild scar of the left lung base unchanged. There is no focal infiltrate, pulmonary edema or pleural effusion. The visualized skeletal structures are stable.  IMPRESSION: No active cardiopulmonary disease.   Electronically Signed   By: Abelardo Diesel M.D.   On: 06/04/2013 18:17   Ct Abdomen Pelvis W Contrast  06/04/2013   CLINICAL DATA:  Abdominal pain and nausea  EXAM: CT ABDOMEN AND PELVIS WITH CONTRAST  TECHNIQUE: Multidetector CT imaging of the abdomen and pelvis was performed using the standard protocol following bolus administration of intravenous contrast.  CONTRAST:  41mL OMNIPAQUE IOHEXOL 300 MG/ML SOLN, 19mL OMNIPAQUE IOHEXOL 300 MG/ML SOLN  COMPARISON:  03/19/2013  FINDINGS: Lung bases are free of acute infiltrate or sizable effusion. The liver, gallbladder and spleen are within normal limits. Some fullness of the biliary ductal system is again noted. The pancreas and adrenal glands are within normal limits with the exception of mild prominence of the common bile duct and pancreatic duct which is stable in appearance. Nonobstructing renal stones and cystic changes are noted. These changes are stable from the prior study. There is scarring in the anterior abdominal wall in an area of previous abscess formation. No fistula is identified. A  jejunostomy catheter with extension is seen. Changes of gastrojejunostomy are noted.  Diffuse aortic calcifications are identified. No aneurysmal dilatation is seen. The bladder is well distended. No pelvic mass lesion is seen. The appendix is not visualized consistent with a prior surgical history.  IMPRESSION: Stable jejunostomy catheter with extension tube.  Postoperative changes in the upper abdomen with resolution of the previously seen abscess cavity.  Chronic changes as described above stable from the prior study.  New changes of gastrojejunostomy.   Electronically Signed   By: Inez Catalina M.D.   On: 06/04/2013 20:13    EKG Interpretation   None       MDM   Final diagnoses:  Abdominal pain  Patient status post gastric fistula surgery repair at [facility] end of December. Patient presents with increased abdominal pain and nausea. Workup of without any significant findings. No significant anemia or leukocytosis. CT scan of the abdomen without any acute findings. Patient improved significantly with antinausea medicine and pain medicine. Patient will be discharged home with hydrocodone and is on multiple Zofran and will followup with her doctors as scheduled. Should return for any new or worse symptoms.   Mervin Kung, MD 06/04/13 2100

## 2013-06-04 NOTE — ED Notes (Signed)
Patient taken to CT dept, but then returned because she needed to use bathroom ED tech assisting patient to bathroom at this time

## 2013-06-04 NOTE — Discharge Instructions (Signed)
Today's workup without any significant abnormalities. Take pain medicine as directed. Take the new type of Zofran with the dissolvable tablet as needed. Return for any newer worse symptoms. Followup with your doctors as scheduled.

## 2013-06-07 ENCOUNTER — Ambulatory Visit (INDEPENDENT_AMBULATORY_CARE_PROVIDER_SITE_OTHER): Payer: Medicare Other | Admitting: Internal Medicine

## 2013-06-07 VITALS — BP 110/60 | HR 91 | Temp 99.5°F | Resp 16 | Ht 65.0 in | Wt 97.8 lb

## 2013-06-07 DIAGNOSIS — R109 Unspecified abdominal pain: Secondary | ICD-10-CM

## 2013-06-07 DIAGNOSIS — R110 Nausea: Secondary | ICD-10-CM

## 2013-06-07 MED ORDER — HYDROCODONE-ACETAMINOPHEN 10-325 MG PO TABS
1.0000 | ORAL_TABLET | Freq: Four times a day (QID) | ORAL | Status: DC | PRN
Start: 2013-06-07 — End: 2013-06-19

## 2013-06-07 MED ORDER — CILIDINIUM-CHLORDIAZEPOXIDE 2.5-5 MG PO CAPS
1.0000 | ORAL_CAPSULE | Freq: Three times a day (TID) | ORAL | Status: DC
Start: 2013-06-07 — End: 2013-06-15

## 2013-06-07 NOTE — Progress Notes (Signed)
Subjective:    Patient ID: Cynthia Bowen, female    DOB: 1945-10-05, 68 y.o.   MRN: [id number]  HPI This chart was scribed for [name], MD by Thea Alken, ED Scribe. This patient was seen in room 11 and the patient's care was started at 12:25 PM.  HPI Comments: BERNIECE Bowen is a 68 y.o. female with h/o gastric fistula surgical repair who presents to the [facility] complaining of constant, worsening abdominal pain onset 3 days ago with associated nausea, emesis and gas. Pt  was seen at the ED 3 days ago for abdominal pain and reports she has not gotten better. Evaluation included CT scan which showed no evidence of recurrence of abscess or collection of air subcutaneously. Pt was prescribed Zofran and pain medication with minor relief to pain. Pt states that she has not been able to sleep due to the pain. She describes the abdominal pain as cramping and tightness. She reports that the pain worsens with movement and coughing. She reports that she has decreased appetite which has resulted in a decrease in weight. She states that she is still on the feeding tube which she believes is making her nauseous and gassy. She takes 2 cans per day but was initially on 7 cans per day. She states she feels worse after she is fed. She states that when she unplugs her feeding tubes she feels much better. She is beginning to increase her by mouth intake . She denies fever , CP, bladder and bowel incontinence. She states otherwise she is impoving   Her medications have greatly diminished. She is now off SSRIs. She is off pain medication. Prior to Admission medications   Medication Sig Start Date End Date Taking? Authorizing Provider  albuterol (PROVENTIL HFA;VENTOLIN HFA) 108 (90 BASE) MCG/ACT inhaler Inhale 2 puffs into the lungs every 6 (six) hours as needed for wheezing.   Yes Historical Provider, MD  diphenhydrAMINE (BENADRYL) 12.5 MG/5ML liquid Take 12.5 mg by mouth 4 (four) times  daily as needed for itching.    Yes Historical Provider, MD  EPINEPHrine (EPIPEN) 0.3 mg/0.3 mL SOAJ Inject 0.3 mg into the muscle daily as needed (anaphylaxis).   Yes Historical Provider, MD  HYDROcodone-acetaminophen (NORCO/VICODIN) 5-325 MG per tablet Take 1-2 tablets by mouth every 6 (six) hours as needed for moderate pain. 06/04/13   Mervin Kung, MD  ondansetron (ZOFRAN ODT) 4 MG disintegrating tablet Take 1 tablet (4 mg total) by mouth every 8 (eight) hours as needed for nausea or vomiting. 06/04/13   Mervin Kung, MD  promethazine (PHENERGAN) 12.5 MG tablet Take 12.5 mg by mouth every 6 (six) hours as needed for nausea or vomiting.  05/21/13   Historical Provider, MD   Review of Systems  Constitutional: Positive for appetite change ( decreased) and unexpected weight change ( decreased).  Respiratory: Negative for chest tightness.   Cardiovascular: Negative for chest pain.  Gastrointestinal: Positive for nausea, vomiting and abdominal pain. Negative for diarrhea, constipation, blood in stool and anal bleeding.  Genitourinary: Negative for dysuria, frequency, difficulty urinating and dyspareunia.      Objective:   Physical Exam  Nursing note and vitals reviewed. Constitutional: She is oriented to person, place, and time. She appears well-developed and well-nourished. No distress.  Her appearance is better than in recent months  HENT:  Head: Normocephalic and atraumatic.  Mouth/Throat: Oropharynx is clear and moist.  Eyes: EOM are normal. Pupils are equal, round, and reactive  to light.  Neck: Neck supple. No thyromegaly present.  Cardiovascular: Normal rate, regular rhythm and normal heart sounds.   Pulmonary/Chest: Effort normal and breath sounds normal. No respiratory distress.  Abdominal: She exhibits no mass. There is tenderness. There is guarding. There is no rebound.  Her midline incision healing by secondary intention now has scab formation There is no cellulitis of  the abdominal wall although she is very tender along all the upper quadrant areas with a slight firmness Percussion tenderness is absent  Musculoskeletal: Normal range of motion. She exhibits no edema.  Lymphadenopathy:    She has no cervical adenopathy.  Neurological: She is alert and oriented to person, place, and time.  Skin: Skin is warm and dry.  Psychiatric: She has a normal mood and affect. Her behavior is normal. Thought content normal.   Filed Vitals:   06/07/13 1203  BP: 110/60  Pulse: 91  Temp: 99.5 F (37.5 C)  Resp: 16   Temperature was 99.5 with a slight increase in WBCs at the emergency room    Assessment & Plan:  I have completed the patient encounter in its entirety as documented by the scribe, with editing by me where necessary. Robert P. Laney Pastor, M.D.  Abdominal pain Nausea alone  At this point the last surgery seems to have been successful Her abdominal pain seems associated with bloating and distention which by history follows her feedings per tube She is better today than last night after discontinuing feedings She will at least for another 12 hours to see if this is a true association She will increase her by mouth intake because her undernutrition is affecting the healing She can try Librax for her abdominal symptoms which has worked for her in the past when she has had bloating and cramping She cannot sleep it's okay to use hydrocodone We will recheck in 7 days/sooner if fever or increased pain

## 2013-06-13 ENCOUNTER — Ambulatory Visit (INDEPENDENT_AMBULATORY_CARE_PROVIDER_SITE_OTHER): Payer: Medicare Other | Admitting: Internal Medicine

## 2013-06-13 VITALS — BP 110/70 | HR 98 | Temp 98.2°F | Resp 16 | Ht 65.0 in | Wt 98.0 lb

## 2013-06-13 DIAGNOSIS — K219 Gastro-esophageal reflux disease without esophagitis: Secondary | ICD-10-CM

## 2013-06-13 DIAGNOSIS — K3189 Other diseases of stomach and duodenum: Secondary | ICD-10-CM

## 2013-06-13 DIAGNOSIS — R634 Abnormal weight loss: Secondary | ICD-10-CM

## 2013-06-13 DIAGNOSIS — R079 Chest pain, unspecified: Secondary | ICD-10-CM

## 2013-06-13 DIAGNOSIS — R109 Unspecified abdominal pain: Secondary | ICD-10-CM

## 2013-06-13 DIAGNOSIS — R110 Nausea: Secondary | ICD-10-CM

## 2013-06-13 LAB — POCT CBC
GRANULOCYTE PERCENT: 64.7 % (ref 37–80)
HEMATOCRIT: 38.5 % (ref 37.7–47.9)
Hemoglobin: 11.6 g/dL — AB (ref 12.2–16.2)
Lymph, poc: 2.1 (ref 0.6–3.4)
MCH: 24.9 pg — AB (ref 27–31.2)
MCHC: 30.1 g/dL — AB (ref 31.8–35.4)
MCV: 82.6 fL (ref 80–97)
MID (CBC): 0.3 (ref 0–0.9)
MPV: 8.5 fL (ref 0–99.8)
PLATELET COUNT, POC: 339 10*3/uL (ref 142–424)
POC Granulocyte: 4.5 (ref 2–6.9)
POC LYMPH %: 30.4 % (ref 10–50)
POC MID %: 4.9 %M (ref 0–12)
RBC: 4.66 M/uL (ref 4.04–5.48)
RDW, POC: 20.4 %
WBC: 7 10*3/uL (ref 4.6–10.2)

## 2013-06-13 LAB — POCT SEDIMENTATION RATE: POCT SED RATE: 26 mm/hr — AB (ref 0–22)

## 2013-06-13 MED ORDER — CLONAZEPAM 1 MG PO TABS: ORAL_TABLET | ORAL | Status: DC

## 2013-06-13 NOTE — Progress Notes (Addendum)
Subjective:    Patient ID: Cynthia Bowen, female    DOB: 09/27/1945, 68 y.o.   MRN: [id number]  HPI This chart was scribed for Cynthia Lin, MD by Thea Alken, ED Scribe. This patient was seen in room 4 and the patient's care was started at 10:22 AM.  HPI Comments: YANESSA Bowen is a 68 y.o. female with h/o gastric fistula surgical repair who presents to the [facility] for a follow up because of continuing severe abdominal and chest pain with nausea. Pt was seen one week ago by Dr. Laney Pastor for abdominal pain following hospital discharge after surgical repair.  Pt states that she still has been waking up in the middle of the night in pain and that she has been awake in pain for 5 hours. She states that she has been feeling light headed when bending over and that she has chest and abdominal tenderness. She reports that she is eating more but cannot eat yogurt. Pt states that eating stiil makes her nauseous. She states that she also feel nausea when getting up from lying down. Pt states that the 5 mg hydrocodone has not been very helpful . Librax was partially successful with easing abdominal pain postprandial.  Her pain today is actually substernal and high epigastric. She complains of discomfort when touching the area along the lower sternum. She complains of pain with movement but not with deep breathing.  Pt states that her last chest X-Ray was done at the ED 2 weeks ago and showed no evidence of pleural effusion anymore. Coumadin was discontinued because it is more than 6 months since her pulmonary embolus and she is no longer sedentary.  She has not been febrile. No cough or shortness of breath. No palpitations. No diarrhea or constipation. 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   Hemoptysis 08/06/2012   PE (pulmonary embolism) 08/05/2012   Abdominal pain 08/05/2012   Incarcerated epigastric hernia s/p primary repair 07/02/2012 07/11/2012   Protein-calorie malnutrition, severe 07/11/2012   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 [id number] 07/10/2012   Loss of weight 06/19/2012   Anorexia 06/19/2012   Unspecified gastritis and gastroduodenitis without mention of hemorrhage 04/18/2012   Pericardial effusion 04/17/2012   COPD GOLD I 01/29/2012   Community acquired pneumonia 12/29/2011   Back pain 12/29/2011   Depression 04/02/2011   GERD (gastroesophageal reflux disease) 04/02/2011   Osteoarthritis 04/02/2011   Cervical neck pain with evidence of disc disease 04/02/2011   Fibromyalgia 04/02/2011    Allergies  Allergen Reactions   Avelox [Moxifloxacin Hcl In Nacl] Nausea And Vomiting   Betadine [Povidone Iodine] Itching and Rash   Alendronate Sodium Nausea And Vomiting   Aspirin Nausea Only   Codeine Nausea And Vomiting   Doxycycline     Pt doesn't remember reaction   Fluconazole     Pt doesn't remember reaction   Hydrocodone Nausea And Vomiting    GI distress   Latex    Neurontin [Gabapentin] Other (See Comments)    Mood changes    Nsaids Other (See Comments)    Severe gastritis &  perforation - avoid NSAIDs when possible   Sertraline Hcl Other (See Comments)    Hallucinations    Sulfa Antibiotics Rash   Prior to Admission medications   Medication Sig Start Date End Date Taking? Authorizing Provider  albuterol (PROVENTIL HFA;VENTOLIN HFA) 108 (90 BASE) MCG/ACT inhaler Inhale 2 puffs into the lungs every 6 (six) hours as needed for wheezing.   Yes  not having to use this     clidinium-chlordiazePOXIDE (LIBRAX) 5-2.5 MG per capsule Take 1 capsule by mouth 4 (four) times daily -  before meals and at bedtime. 06/07/13  Yes [name] Koyanagi, MD  diphenhydrAMINE (BENADRYL) 12.5 MG/5ML liquid Take 12.5 mg by mouth 4 (four) times daily as needed for itching.    Yes Historical Provider, MD  EPINEPHrine (EPIPEN) 0.3 mg/0.3 mL SOAJ Inject 0.3 mg into the muscle daily as needed (anaphylaxis).   Yes Historical Provider, MD  FLUoxetine (PROZAC) 20 MG/5ML solution  05/25/13  Yes Historical Provider, MD This was discontinued because she had a weird reaction during hospitalization //  HYDROcodone-acetaminophen (NORCO) 10-325 MG per tablet Take 1 tablet by mouth every 6 (six) hours as needed. 06/07/13  Yes [name] Koyanagi, MD  ondansetron (ZOFRAN ODT) 4 MG disintegrating tablet Take 1 tablet (4 mg total) by mouth every 8 (eight) hours as needed for nausea or vomiting. 06/04/13  Yes Mervin Kung, MD  promethazine (PHENERGAN) 12.5 MG tablet Take 12.5 mg by mouth every 6 (six) hours as needed for nausea or vomiting.  05/21/13  Yes Historical Provider, MD   Review of Systems  Constitutional: Negative for chills and fatigue.       Her weight is remaining stable this week  HENT: Negative for congestion and trouble swallowing.   Respiratory: Negative for chest tightness and shortness of breath.   Cardiovascular: Negative for palpitations and leg swelling.  Gastrointestinal: Positive for abdominal pain.  Neurological: Positive for light-headedness.  She does continue to have reflux symptoms intermittently that she says are not associated with her pain or with her nausea     Objective:   Physical Exam  Nursing note and vitals reviewed. Constitutional: She is oriented to person, place, and time. She appears well-developed and well-nourished.  She is complaining of abdominal discomfort but she does not appear to be in any acute distress  HENT:  Head: Normocephalic and atraumatic.   Mouth/Throat: Oropharynx is clear and moist.  Eyes: EOM are normal. Pupils are equal, round, and reactive to light.  Neck: Neck supple. No thyromegaly present.  Cardiovascular: Normal rate, regular rhythm, normal heart sounds and intact distal pulses.   No murmur heard. Pulmonary/Chest: Effort normal and breath sounds normal. No respiratory distress. She has no wheezes.  She is markedly tender to palpation along one third of the sternum and into the anterior rib cage bilaterally without any swelling or ecchymoses and no obvious skin rash  Abdominal:  She is mildly tender in the xiphoid area but not in the left or right upper quadrant and not in the mid epigastric area The midline wound is healing without signs of cellulitis The overall abdomen is slightly distended but not rigid and there is no guarding or rebound tenderness and no organomegaly  Musculoskeletal: Normal range of motion. She exhibits edema.  Lymphadenopathy:    She has no cervical adenopathy.  Neurological: She is alert and oriented to person, place, and time.  Skin: Skin is warm and dry.  Psychiatric: She has a normal mood and affect. Her  behavior is normal.   Filed Vitals:   06/13/13 1006  BP: 110/70  Pulse: 98  Temp: 98.2 F (36.8 C)  TempSrc: Oral  Resp: 16  Height: 5\' 5"  (1.651 m)  Weight: 98 lb (44.453 kg)  SpO2: 99%      Results for orders placed in visit on 06/13/13  POCT CBC      Result Value Ref Range   WBC 7.0  4.6 - 10.2 K/uL   Lymph, poc 2.1  0.6 - 3.4   POC LYMPH PERCENT 30.4  10 - 50 %L   MID (cbc) 0.3  0 - 0.9   POC MID % 4.9  0 - 12 %M   POC Granulocyte 4.5  2 - 6.9   Granulocyte percent 64.7  37 - 80 %G   RBC 4.66  4.04 - 5.48 M/uL   Hemoglobin 11.6 (*) 12.2 - 16.2 g/dL   HCT, POC 38.5  37.7 - 47.9 %   MCV 82.6  80 - 97 fL   MCH, POC 24.9 (*) 27 - 31.2 pg   MCHC 30.1 (*) 31.8 - 35.4 g/dL   RDW, POC 20.4     Platelet Count, POC 339  142 - 424 K/uL   MPV 8.5  0 - 99.8 fL     Assessment & Plan:  I have completed the patient encounter in its entirety as documented by the scribe, with editing by me where necessary. Robert P. Laney Pastor, M.D.  Chest pain, unspecified - Plan: POCT SEDIMENTATION RATE  CBC normal, recent chest x-ray normal, not losing weight this week, and exam that is relatively benign and  suggest that this pain although distress and is not indicative of a critical process. The pain is keeping her  awake at night and I think is causing the radial anxiety so we'll try Klonopin at bedtime  with close followup  Nausea alone--- Zofran when necessary  Abdominal pain--but less than that in the past/wound improving  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 March2014--status post exploratory laparotomy 09/11/2012. Status post esophagoscopy with foreign body removal 01/20/2013. Status post I&D abdominal wall abscess 03/19/2013. Status post enterocutaneous fistula closure 04/15/2013. Partial gastrectomy 04/15/2013.  GERD (gastroesophageal reflux disease)-she is on omeprazole  Loss of weight stable for now  Meds ordered this encounter  Medications   clonazePAM (KLONOPIN) 1 MG tablet    Sig: One half to one tablet at bedtime as needed    Dispense:  30 tablet    Refill:  1   Recheck in one week sooner if worse

## 2013-06-15 ENCOUNTER — Other Ambulatory Visit: Payer: Self-pay | Admitting: Internal Medicine

## 2013-06-17 ENCOUNTER — Telehealth: Payer: Self-pay

## 2013-06-17 NOTE — Telephone Encounter (Signed)
[name], do you want pt to remain on this and give RFs? Pended w/QS for 1 mos and w/RFs for review.

## 2013-06-17 NOTE — Telephone Encounter (Signed)
Dr.[name], Cynthia Bowen from [facility] states that they sent over an order for skilled nursing on 05/29/13 but they haven't received a response from [place] as of yet, she would like to know the status.  Best# [phone] Extension: [id number]

## 2013-06-19 ENCOUNTER — Other Ambulatory Visit: Payer: Self-pay

## 2013-06-19 NOTE — Telephone Encounter (Signed)
i didn't order anything-you'll have to see if its from [facility] or elsewhere

## 2013-06-19 NOTE — Telephone Encounter (Signed)
[name], I don't see any messages prior to this from pt or pharmacy. Please review.

## 2013-06-19 NOTE — Telephone Encounter (Signed)
Patient stated that she called twice this week (requesting Dr. Laney Pastor) regarding a refill for Hydrocodone, but has not received a response. I advised patient that I would create a new phone message requesting a member of our medical staff to return her call.      Thank You!!!

## 2013-06-22 ENCOUNTER — Other Ambulatory Visit: Payer: Self-pay | Admitting: Internal Medicine

## 2013-06-22 MED ORDER — HYDROCODONE-ACETAMINOPHEN 10-325 MG PO TABS: 1.0000 | ORAL_TABLET | Freq: Four times a day (QID) | ORAL | Status: DC | PRN

## 2013-06-22 NOTE — Telephone Encounter (Signed)
Notified pt ready. 

## 2013-06-22 NOTE — Telephone Encounter (Signed)
lmom with info

## 2013-06-23 NOTE — Telephone Encounter (Signed)
Do you want to give pt RFs on this?

## 2013-07-05 ENCOUNTER — Ambulatory Visit: Payer: Medicare Other

## 2013-07-05 ENCOUNTER — Ambulatory Visit (INDEPENDENT_AMBULATORY_CARE_PROVIDER_SITE_OTHER): Payer: Medicare Other | Admitting: Family Medicine

## 2013-07-05 VITALS — BP 100/58 | HR 104 | Temp 98.2°F | Resp 18 | Ht 64.5 in | Wt 100.0 lb

## 2013-07-05 DIAGNOSIS — R109 Unspecified abdominal pain: Secondary | ICD-10-CM

## 2013-07-05 LAB — COMPREHENSIVE METABOLIC PANEL
ALT: 17 U/L (ref 0–35)
AST: 16 U/L (ref 0–37)
Albumin: 3.9 g/dL (ref 3.5–5.2)
Alkaline Phosphatase: 88 U/L (ref 39–117)
BUN: 13 mg/dL (ref 6–23)
CO2: 29 mEq/L (ref 19–32)
Calcium: 9.9 mg/dL (ref 8.4–10.5)
Chloride: 104 mEq/L (ref 96–112)
Creat: 0.64 mg/dL (ref 0.50–1.10)
Glucose, Bld: 67 mg/dL — ABNORMAL LOW (ref 70–99)
Potassium: 4.6 mEq/L (ref 3.5–5.3)
Sodium: 138 mEq/L (ref 135–145)
Total Bilirubin: 0.3 mg/dL (ref 0.2–1.2)
Total Protein: 7.4 g/dL (ref 6.0–8.3)

## 2013-07-05 LAB — POCT CBC
Granulocyte percent: 64.4 %G (ref 37–80)
HCT, POC: 39 % (ref 37.7–47.9)
Hemoglobin: 12.5 g/dL (ref 12.2–16.2)
Lymph, poc: 1.8 (ref 0.6–3.4)
MCH, POC: 25.9 pg — AB (ref 27–31.2)
MCHC: 32.1 g/dL (ref 31.8–35.4)
MCV: 80.7 fL (ref 80–97)
MID (cbc): 0.3 (ref 0–0.9)
MPV: 9 fL (ref 0–99.8)
POC Granulocyte: 3.9 (ref 2–6.9)
POC LYMPH PERCENT: 30.1 %L (ref 10–50)
POC MID %: 5.5 %M (ref 0–12)
Platelet Count, POC: 389 10*3/uL (ref 142–424)
RBC: 4.83 M/uL (ref 4.04–5.48)
RDW, POC: 22.2 %
WBC: 6.1 10*3/uL (ref 4.6–10.2)

## 2013-07-05 MED ORDER — METRONIDAZOLE 250 MG PO TABS: 250.0000 mg | ORAL_TABLET | Freq: Two times a day (BID) | ORAL | Status: DC

## 2013-07-05 NOTE — Progress Notes (Addendum)
Subjective:   This chart was scribed for Cynthia Haber, MD, by Cynthia Bowen, scribe. The pt's care was started at 11:57 AM.    Patient ID: Cynthia Bowen, female    DOB: 11/10/1945, 68 y.o.   MRN: [id number]  Chief Complaint  Patient presents with   Follow-up    shest and stomach pain    HPI  Cynthia Bowen is a 68 y.o. female who presents to [facility] complaining of post-surgical abdominal tenderness. She states she has experienced pervasive tenderness since a second laparoscopic surgery last week. The pt  States she has experienced uncontrolled flatulence and a sensation of bloatedness. She denies hematuria.   She had a laparoscopic surgery a year ago and an additional laparoscropic surgery.  She states prior to the most recent lap surgery, she had been tube-fed. She also states last December she was 121 pounds, but prior to the most recent surgery she was at 88 pounds; she is gaining weigh with a current weight of 100 oundst. She is able to eat during the day, but she continues to use a feeding tube at night.   Cynthia Bowen used to work for [employer]. [employer] and [employer]; she is retired.   She states she has quit smoking.   Past Medical History  Diagnosis Date   COPD (chronic obstructive pulmonary disease)    Pneumonia 12-2011   GERD (gastroesophageal reflux disease)    Headache(784.0)    Arthritis     osteoarthritis   Allergy    Depression    Neuromuscular disorder    Osteoporosis    Bronchitis     CURRENTLY AS OF 06/30/12 - HAS COUGH AND FINISHED ANTIBIOTIC FOR BRONCHITIS   Fibromyalgia    Pain     ABDOMINAL PAIN AND NAUSEA   Pain     SOMETIMES PAIN RIGHT EAR AND NECK--STATES CAUSED BY A "LUMP" ON BACK OF EAR--USES KENALOG CREAM TOPICALLY AS NEEDED.   Gastrocutaneous fistula    Anemia    Anxiety    Blood transfusion without reported diagnosis    Past Surgical History  Procedure Laterality Date   Abdominal hysterectomy      Esophagogastroduodenoscopy  04/18/2012    Procedure: ESOPHAGOGASTRODUODENOSCOPY (EGD);  Surgeon: Inda Castle, MD;  Location: [facility];  Service: Endoscopy;  Laterality: N/A;   Eus  05/29/2012    Procedure: UPPER ENDOSCOPIC ULTRASOUND (EUS) LINEAR;  Surgeon: Milus Banister, MD;  Location: [facility];  Service: Endoscopy;  Laterality: N/A;   Appendectomy     Spine surgery      CERVICAL SPINE SURGERY X 2 - INCLUDING FUSION; LUMBAR SURGERY FOR RUPTURED [place]   Eye surgery      BILATERAL CATARACT EXTRACTIONS   Laparoscopic abdominal exploration N/A 07/02/2012    Procedure: converted to laparotomy with gastric biopsy;  Surgeon: Imogene Burn. Georgette Dover, MD;  Location: [facility];  Service: General;  Laterality: N/A;  Laparoscopic Gastric Biospy attempted.    Laparotomy N/A 07/07/2012    Procedure: EXPLORATORY LAPAROTOMY repair of gastric perforation with omental graham patch, drainage of abdominal abcess;  Surgeon: Imogene Burn. Georgette Dover, MD;  Location: [facility];  Service: General;  Laterality: N/A;   Stomach surgery  07/07/2012    Omental patch of gastric perforation   Laparotomy N/A 07/13/2012    Procedure: EXPLORATORY LAPAROTOMY repair gastric leak;  Surgeon: Edward Jolly, MD;  Location: [facility];  Service: General;  Laterality: N/A;   Laparotomy N/A 11/11/2012    Procedure: EXPLORATORY  LAPAROTOMY;  Surgeon: Imogene Burn. Georgette Dover, MD;  Location: [place];  Service: General;  Laterality: N/A;   Bowel resection N/A 11/11/2012    Procedure: SMALL BOWEL RESECTION;  Surgeon: Imogene Burn. Georgette Dover, MD;  Location: [place];  Service: General;  Laterality: N/A;   Minor application of wound vac N/A 11/11/2012    Procedure: APPLICATION OF WOUND VAC;  Surgeon: Imogene Burn. Georgette Dover, MD;  Location: [place];  Service: General;  Laterality: N/A;   Jejunostomy N/A 11/11/2012    Procedure: PLACEMENT OF FEEDING JEJUNOSTOMY TUBE;  Surgeon: Imogene Burn. Georgette Dover, MD;  Location: [place];  Service: General;  Laterality: N/A;   Lysis of adhesion  N/A 11/11/2012    Procedure: LYSIS OF ADHESION;  Surgeon: Imogene Burn. Georgette Dover, MD;  Location: [place];  Service: General;  Laterality: N/A;   Hernia repair     Gastric fistula repair      05/16/2013   Current Outpatient Prescriptions on File Prior to Visit  Medication Sig Dispense Refill   albuterol (PROVENTIL HFA;VENTOLIN HFA) 108 (90 BASE) MCG/ACT inhaler Inhale 2 puffs into the lungs every 6 (six) hours as needed for wheezing.       clidinium-chlordiazePOXIDE (LIBRAX) 5-2.5 MG per capsule TAKE ONE CAPSULE BY MOUTH 4 TIMES A DAY BEFORE MEALS AND AT BEDTIME  120 capsule  5   clonazePAM (KLONOPIN) 1 MG tablet One half to one tablet at bedtime as needed  30 tablet  1   diphenhydrAMINE (BENADRYL) 12.5 MG/5ML liquid Take 12.5 mg by mouth 4 (four) times daily as needed for itching.        EPINEPHrine (EPIPEN) 0.3 mg/0.3 mL SOAJ Inject 0.3 mg into the muscle daily as needed (anaphylaxis).       FLUoxetine (PROZAC) 20 MG/5ML solution        HYDROcodone-acetaminophen (NORCO) 10-325 MG per tablet Take 1 tablet by mouth every 6 (six) hours as needed.  30 tablet  0   ondansetron (ZOFRAN ODT) 4 MG disintegrating tablet Take 1 tablet (4 mg total) by mouth every 8 (eight) hours as needed for nausea or vomiting.  20 tablet  0   promethazine (PHENERGAN) 12.5 MG tablet TAKE 1 TABLET (12.5 MG TOTAL) BY MOUTH EVERY 6 (SIX) HOURS AS NEEDED FOR UP TO 7 DAYS FOR NAUSEA.  30 tablet  0   History   Social History   Marital Status: Single    Spouse Name: N/A    Number of Children: 0   Years of Education: N/A   Social History Main Topics   Smoking status: Former Smoker -- 0.50 packs/day for 35 years    Types: Cigarettes    Start date: 01/04/2003    Quit date: 04/17/2005   Smokeless tobacco: Never Used   Alcohol Use: No   Drug Use: No   Sexual Activity: No    Social History Narrative   Single. Education: [employer]. Exercise.    Review of Systems     Objective:   Physical Exam  Nursing  note and vitals reviewed. Constitutional: She is oriented to person, place, and time. She appears well-developed and well-nourished. No distress.  HENT:  Head: Normocephalic and atraumatic.  Eyes: EOM are normal.  Neck: Neck supple. No tracheal deviation present.  Cardiovascular: Normal rate, regular rhythm and normal heart sounds.   No murmur heard. Pulmonary/Chest: Effort normal and breath sounds normal. No respiratory distress. She has no wheezes. She has no rales.  Abdominal:  Tender to epigastrium.   Musculoskeletal: Normal range of motion.  Neurological: She is alert and oriented to person, place, and time.  Skin: Skin is warm and dry.  Psychiatric: She has a normal mood and affect. Her behavior is normal.  marked yellow scaly post surgical scar midline abdomen.  High pitched abdominal sounds. Results for orders placed in visit on 07/05/13  POCT CBC      Result Value Ref Range   WBC 6.1  4.6 - 10.2 K/uL   Lymph, poc 1.8  0.6 - 3.4   POC LYMPH PERCENT 30.1  10 - 50 %L   MID (cbc) 0.3  0 - 0.9   POC MID % 5.5  0 - 12 %M   POC Granulocyte 3.9  2 - 6.9   Granulocyte percent 64.4  37 - 80 %G   RBC 4.83  4.04 - 5.48 M/uL   Hemoglobin 12.5  12.2 - 16.2 g/dL   HCT, POC 39.0  37.7 - 47.9 %   MCV 80.7  80 - 97 fL   MCH, POC 25.9 (*) 27 - 31.2 pg   MCHC 32.1  31.8 - 35.4 g/dL   RDW, POC 22.2     Platelet Count, POC 389  142 - 424 K/uL   MPV 9.0  0 - 99.8 fL   [facility] reading (PRIMARY) by  Dr. Joseph Art:  Nonspecific diffuse small bowel gas..     Vitals: BP 100/58   Pulse 104   Temp(Src) 98.2 F (36.8 C) (Oral)   Resp 18   Ht 5' 4.5" (1.638 m)   Wt 100 lb (45.36 kg)   BMI 16.91 kg/m2   SpO2 98%     Assessment & Plan:  Abdominal pain - Plan: POCT CBC, Comprehensive metabolic panel, DG Abd 1 View, metroNIDAZOLE (FLAGYL) 250 MG tablet  Signed, Cynthia Haber, MD

## 2013-07-07 ENCOUNTER — Ambulatory Visit (INDEPENDENT_AMBULATORY_CARE_PROVIDER_SITE_OTHER): Payer: Medicare Other | Admitting: Family Medicine

## 2013-07-07 ENCOUNTER — Telehealth: Payer: Self-pay

## 2013-07-07 VITALS — BP 100/60 | HR 90 | Temp 98.0°F | Resp 18 | Ht 65.0 in | Wt 100.0 lb

## 2013-07-07 DIAGNOSIS — R1013 Epigastric pain: Secondary | ICD-10-CM

## 2013-07-07 NOTE — Progress Notes (Signed)
Subjective:    Patient ID: Cynthia Bowen, female    DOB: 06-28-1945, 68 y.o.   MRN: [id number] This chart was scribed for Robyn Haber, MD by Anastasia Pall, ED Scribe. This patient was seen in room 14 and the patient's care was started at 6:44 PM.  Chief Complaint  Patient presents with   Follow-up    Abdominal pain   HPI Cynthia Bowen is a 68 y.o. female Pt with Pt has h/o gastrostomy and tube placement, presents for a follow up of abdominal pain and distention. She reports the Flagyl she was given has given her nausea and increased abdominal distention. She states she has been passing gas frequently. She denies taking vitamins since her surgeries, due to having to take all the other medications. She reports that deep breathing exacerbates her abdominal pain. She states she has not been able to eat much daily. She denies SOB, and any other associated symptoms.   She had a laparoscopic surgery a year ago and an additional laparoscropic surgery. She states prior to the most recent lap surgery, she had been tube-fed. She also states last December she was 121 pounds, but prior to the most recent surgery she was at 88 pounds; she is gaining weigh with a current weight of 100 oundst. She is able to eat during the day, but she continues to use a feeding tube at night.  Cynthia Bowen used to work for [employer]. [employer] and [employer]; she is retired.  She states she has quit smoking.    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     Hemoptysis 08/06/2012   PE (pulmonary embolism) 08/05/2012   Abdominal pain 08/05/2012   Incarcerated epigastric hernia s/p primary repair 07/02/2012 07/11/2012   Protein-calorie malnutrition, severe 07/11/2012   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 [id number] 07/10/2012   Loss of weight 06/19/2012   Anorexia 06/19/2012   Unspecified gastritis and gastroduodenitis without mention of hemorrhage 04/18/2012   Pericardial effusion 04/17/2012   COPD GOLD I 01/29/2012   Community acquired pneumonia 12/29/2011   Back pain 12/29/2011   Depression 04/02/2011   GERD (gastroesophageal reflux disease) 04/02/2011   Osteoarthritis 04/02/2011   Cervical neck pain with evidence of disc disease 04/02/2011   Fibromyalgia 04/02/2011   Prior to Admission medications   Medication Sig Start Date End Date Taking? Authorizing Provider  albuterol (PROVENTIL HFA;VENTOLIN HFA) 108 (90 BASE) MCG/ACT inhaler Inhale 2 puffs into the lungs every 6 (six) hours as needed for wheezing.   Yes Historical Provider, MD  clonazePAM Bobbye [place]) 1 MG tablet One half to one tablet at bedtime as needed 06/13/13  Yes [name] Koyanagi, MD  diphenhydrAMINE (BENADRYL) 12.5 MG/5ML liquid Take 12.5 mg by mouth 4 (four) times daily as needed for itching.    Yes Historical Provider, MD  EPINEPHrine (EPIPEN) 0.3 mg/0.3 mL SOAJ Inject 0.3 mg into the muscle daily as needed (anaphylaxis).   Yes Historical Provider, MD  FLUoxetine (PROZAC) 20 MG/5ML solution  05/25/13  Yes Historical Provider, MD  metroNIDAZOLE (FLAGYL) 250 MG tablet Take 1 tablet (250 mg total) by mouth 2 (two) times daily. 07/05/13  Yes Robyn Haber, MD  promethazine (PHENERGAN) 12.5 MG tablet TAKE 1 TABLET (12.5 MG TOTAL) BY MOUTH EVERY 6 (SIX) HOURS AS NEEDED FOR UP TO 7 DAYS FOR NAUSEA.   Yes [name] Koyanagi, MD  clidinium-chlordiazePOXIDE (LIBRAX) 5-2.5 MG per capsule TAKE ONE CAPSULE BY MOUTH 4 TIMES A DAY  BEFORE MEALS AND AT BEDTIME    [name] Koyanagi, MD  HYDROcodone-acetaminophen (NORCO) 10-325 MG per tablet Take 1 tablet by mouth every 6 (six) hours as needed. 06/22/13   [name] Koyanagi, MD   Review of Systems  Constitutional: Negative for fever.  Gastrointestinal: Positive for nausea, abdominal pain and abdominal distention.       Objective:   Physical Exam Nursing note and vitals reviewed. Constitutional: Pt is oriented to person, place, and time. Pt appears well-developed and well-nourished. No distress.  HENT: Right TM nl. Left TM nl. Oropharynx clear and moist, no exudate. Nose nl.  Head: Normocephalic and atraumatic.  Eyes: EOM are normal. Pupils are equal, round, and reactive to light.  Neck: Neck supple. No thyromegaly. No cervical adenopathy.  Cardiovascular: Normal rate, regular rhythm and normal heart sounds.  Exam reveals no gallop and no friction rub. No murmur heard. Pulmonary/Chest: Effort normal and breath sounds normal. No respiratory distress. Pt has no wheezes. Pt has no rales.  Abdominal: Soft. Bowel sounds are normal. There is moderate epigastric tenderness with deep palpation. There is no rebound and no guarding. No hepatosplenomegaly. No CVA tenderness.  Musculoskeletal: Normal range of motion. No tenderness. No edema.   Neurological: Pt is alert and oriented to person, place, and time.  Skin: Skin is warm and dry.  midline surgical scar continues to show erythema and scaly surface consistent with healing by secondary intention without infection  Psychiatric: Pt has a normal mood and affect. Pt's behavior is normal.      Assessment & Plan:   Abdominal pain, epigastric - Plan: Ambulatory referral to General Surgery  Signed, Robyn Haber, MD

## 2013-07-07 NOTE — Telephone Encounter (Signed)
PT STATES DR KURT TOLD HER TO CALL BACK IF SHE WASN'T ANY BETTER AND SHE ISN'T. [place] [phone]

## 2013-07-07 NOTE — Telephone Encounter (Signed)
Ask pt to rtc- pt will be in today around 2pm

## 2013-07-08 NOTE — Telephone Encounter (Signed)
Thayer Headings from [facility] [place] [facility] is needing further clinical information before they can schedule appt  [phone]

## 2013-07-09 ENCOUNTER — Emergency Department (HOSPITAL_COMMUNITY): Payer: Medicare Other

## 2013-07-09 ENCOUNTER — Emergency Department (HOSPITAL_COMMUNITY)
Admission: EM | Admit: 2013-07-09 | Discharge: 2013-07-09 | Disposition: A | Discharge status: Home / Self Care | Payer: Medicare Other | Attending: Emergency Medicine | Admitting: Emergency Medicine

## 2013-07-09 ENCOUNTER — Encounter (HOSPITAL_COMMUNITY): Payer: Self-pay | Admitting: Emergency Medicine

## 2013-07-09 DIAGNOSIS — F3289 Other specified depressive episodes: Secondary | ICD-10-CM | POA: Insufficient documentation

## 2013-07-09 DIAGNOSIS — R109 Unspecified abdominal pain: Secondary | ICD-10-CM

## 2013-07-09 DIAGNOSIS — Z91040 Latex allergy status: Secondary | ICD-10-CM | POA: Insufficient documentation

## 2013-07-09 DIAGNOSIS — F411 Generalized anxiety disorder: Secondary | ICD-10-CM | POA: Insufficient documentation

## 2013-07-09 DIAGNOSIS — K529 Noninfective gastroenteritis and colitis, unspecified: Secondary | ICD-10-CM

## 2013-07-09 DIAGNOSIS — Z862 Personal history of diseases of the blood and blood-forming organs and certain disorders involving the immune mechanism: Secondary | ICD-10-CM | POA: Insufficient documentation

## 2013-07-09 DIAGNOSIS — J449 Chronic obstructive pulmonary disease, unspecified: Secondary | ICD-10-CM | POA: Insufficient documentation

## 2013-07-09 DIAGNOSIS — M1990 Unspecified osteoarthritis, unspecified site: Secondary | ICD-10-CM | POA: Insufficient documentation

## 2013-07-09 DIAGNOSIS — J4489 Other specified chronic obstructive pulmonary disease: Secondary | ICD-10-CM | POA: Insufficient documentation

## 2013-07-09 DIAGNOSIS — Z87891 Personal history of nicotine dependence: Secondary | ICD-10-CM | POA: Insufficient documentation

## 2013-07-09 DIAGNOSIS — F329 Major depressive disorder, single episode, unspecified: Secondary | ICD-10-CM | POA: Insufficient documentation

## 2013-07-09 DIAGNOSIS — Z8701 Personal history of pneumonia (recurrent): Secondary | ICD-10-CM | POA: Insufficient documentation

## 2013-07-09 DIAGNOSIS — Z79899 Other long term (current) drug therapy: Secondary | ICD-10-CM | POA: Insufficient documentation

## 2013-07-09 DIAGNOSIS — Z90710 Acquired absence of both cervix and uterus: Secondary | ICD-10-CM | POA: Insufficient documentation

## 2013-07-09 DIAGNOSIS — K5289 Other specified noninfective gastroenteritis and colitis: Secondary | ICD-10-CM | POA: Insufficient documentation

## 2013-07-09 DIAGNOSIS — Z8669 Personal history of other diseases of the nervous system and sense organs: Secondary | ICD-10-CM | POA: Insufficient documentation

## 2013-07-09 LAB — COMPREHENSIVE METABOLIC PANEL
ALT: 14 U/L (ref 0–35)
AST: 19 U/L (ref 0–37)
Albumin: 3.3 g/dL — ABNORMAL LOW (ref 3.5–5.2)
Alkaline Phosphatase: 95 U/L (ref 39–117)
BUN: 11 mg/dL (ref 6–23)
CALCIUM: 9.6 mg/dL (ref 8.4–10.5)
CO2: 24 meq/L (ref 19–32)
CREATININE: 0.6 mg/dL (ref 0.50–1.10)
Chloride: 100 mEq/L (ref 96–112)
GFR calc Af Amer: >90 mL/min (ref >90)
Glucose, Bld: 75 mg/dL (ref 70–99)
Potassium: 4.2 mEq/L (ref 3.7–5.3)
Sodium: 137 mEq/L (ref 137–147)
Total Bilirubin: <0.2 mg/dL — ABNORMAL LOW (ref 0.3–1.2)
Total Protein: 7.7 g/dL (ref 6.0–8.3)

## 2013-07-09 LAB — CBC WITH DIFFERENTIAL/PLATELET
BASOS PCT: 0 % (ref 0–1)
Basophils Absolute: 0 10*3/uL (ref 0.0–0.1)
Eosinophils Absolute: 0.1 10*3/uL (ref 0.0–0.7)
Eosinophils Relative: 2 % (ref 0–5)
HEMATOCRIT: 38.3 % (ref 36.0–46.0)
Hemoglobin: 12.9 g/dL (ref 12.0–15.0)
LYMPHS PCT: 41 % (ref 12–46)
Lymphs Abs: 2.6 10*3/uL (ref 0.7–4.0)
MCH: 26.7 pg (ref 26.0–34.0)
MCHC: 33.7 g/dL (ref 30.0–36.0)
MCV: 79.1 fL (ref 78.0–100.0)
MONO ABS: 0.4 10*3/uL (ref 0.1–1.0)
Monocytes Relative: 6 % (ref 3–12)
Neutro Abs: 3.3 10*3/uL (ref 1.7–7.7)
Neutrophils Relative %: 51 % (ref 43–77)
Platelets: 329 10*3/uL (ref 150–400)
RBC: 4.84 MIL/uL (ref 3.87–5.11)
RDW: 20.7 % — AB (ref 11.5–15.5)
WBC: 6.4 10*3/uL (ref 4.0–10.5)

## 2013-07-09 LAB — URINALYSIS, ROUTINE W REFLEX MICROSCOPIC
BILIRUBIN URINE: NEGATIVE
Glucose, UA: NEGATIVE mg/dL
Hgb urine dipstick: NEGATIVE
Ketones, ur: NEGATIVE mg/dL
Leukocytes, UA: NEGATIVE
Nitrite: NEGATIVE
PH: 6 (ref 5.0–8.0)
Protein, ur: NEGATIVE mg/dL
SPECIFIC GRAVITY, URINE: 1.011 (ref 1.005–1.030)
UROBILINOGEN UA: 0.2 mg/dL (ref 0.0–1.0)

## 2013-07-09 LAB — LIPASE, BLOOD: Lipase: 68 U/L — ABNORMAL HIGH (ref 11–59)

## 2013-07-09 MED ORDER — OXYCODONE-ACETAMINOPHEN 5-325 MG PO TABS: 1.0000 | ORAL_TABLET | Freq: Four times a day (QID) | ORAL | Status: DC | PRN

## 2013-07-09 MED ORDER — PROMETHAZINE HCL 25 MG PO TABS: 25.0000 mg | ORAL_TABLET | Freq: Four times a day (QID) | ORAL | Status: DC | PRN

## 2013-07-09 MED ORDER — ONDANSETRON HCL 4 MG/2ML IJ SOLN
4.0000 mg | Freq: Once | INTRAMUSCULAR | Status: AC
  Administered 2013-07-09: 4 mg via INTRAVENOUS
  Filled 2013-07-09: qty 2

## 2013-07-09 MED ORDER — IOHEXOL 300 MG/ML  SOLN
80.0000 mL | Freq: Once | INTRAMUSCULAR | Status: AC | PRN
  Administered 2013-07-09: 80 mL via INTRAVENOUS

## 2013-07-09 MED ORDER — HYDROMORPHONE HCL PF 1 MG/ML IJ SOLN
1.0000 mg | Freq: Once | INTRAMUSCULAR | Status: AC
  Administered 2013-07-09: 1 mg via INTRAVENOUS
  Filled 2013-07-09: qty 1

## 2013-07-09 NOTE — ED Notes (Signed)
Pt placed on O2 sat monitor

## 2013-07-09 NOTE — ED Notes (Signed)
Pt reports centralized-mid abdominal pain. Pt reports having an abdominal surgery on April 15, 2013 in which she had a correction of two fistulas. Pt reports pain is at the location of the surgical incision. Pt reports that the site is tender and hurts for even her clothes to touch the area. Pt is A/O x4, in NAD, and vitals are WDL.

## 2013-07-09 NOTE — ED Provider Notes (Signed)
CSN: [id number]     Arrival date & time 07/09/13  1536 History   First MD Initiated Contact with Patient 07/09/13 1555     Chief Complaint  Patient presents with  . Abdominal Pain     (Consider location/radiation/quality/duration/timing/severity/associated sxs/prior Treatment) HPI Comments: Patient is a 68 year old female with history of COPD, GERD, neuromuscular disorder, osteoporosis, fibromyalgia, gastrocuteneous fistula, and anemia who presents today with abdominal pain. 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 Since that time she has had residual pain. This pain worsened last week. She was seen at [facility] on Sunday and was started on an antibiotic. She does not remember which antibiotic this was because on Tuesday it was discontinued when she was seen at [facility] again. At that time they told her she needed to see her surgeon at [facility]. She has an appointment next week. She is here today because the pain is unbearable. The pain is a constant sharp, grabbing pain. It is worsen with palpation and if anything touches it, even when clothes graze the site. She was able to eat hash browns, eggs, toast, and [place] pot pie today without worsening her symptoms. She has associated nausea without vomiting. She has run out of her pain and nausea medications. Her last BM was this morning and it was normal for her. She denies fevers, chills, shortness of breath, chest pain.   The history is provided by the patient. No language interpreter was used.    Past Medical History  Diagnosis Date  . COPD (chronic obstructive pulmonary disease)   . Pneumonia 12-2011  . GERD (gastroesophageal reflux disease)   . Headache(784.0)   . Arthritis     osteoarthritis  . Allergy   . Depression   . Neuromuscular disorder   . Osteoporosis   . Bronchitis     CURRENTLY AS OF 06/30/12 - HAS COUGH AND FINISHED ANTIBIOTIC FOR BRONCHITIS  .  Fibromyalgia   . Pain     ABDOMINAL PAIN AND NAUSEA  . Pain     SOMETIMES PAIN RIGHT EAR AND NECK--STATES CAUSED BY A "LUMP" ON BACK OF EAR--USES KENALOG CREAM TOPICALLY AS NEEDED.  Marland Kitchen Gastrocutaneous fistula   . Anemia   . Anxiety   . Blood transfusion without reported diagnosis    Past Surgical History  Procedure Laterality Date  . Abdominal hysterectomy    . Esophagogastroduodenoscopy  04/18/2012    Procedure: ESOPHAGOGASTRODUODENOSCOPY (EGD);  Surgeon: Inda Castle, MD;  Location: [facility];  Service: Endoscopy;  Laterality: N/A;  . Eus  05/29/2012    Procedure: UPPER ENDOSCOPIC ULTRASOUND (EUS) LINEAR;  Surgeon: Milus Banister, MD;  Location: [facility];  Service: Endoscopy;  Laterality: N/A;  . Appendectomy    . Spine surgery      CERVICAL SPINE SURGERY X 2 - INCLUDING FUSION; LUMBAR SURGERY FOR RUPTURED DISC  . Eye surgery      BILATERAL CATARACT EXTRACTIONS  . Laparoscopic abdominal exploration N/A 07/02/2012    Procedure: converted to laparotomy with gastric biopsy;  Surgeon: Imogene Burn. Georgette Dover, MD;  Location: [facility];  Service: General;  Laterality: N/A;  Laparoscopic Gastric Biospy attempted.   . Laparotomy N/A 07/07/2012    Procedure: EXPLORATORY LAPAROTOMY repair of gastric perforation with omental graham patch, drainage of abdominal abcess;  Surgeon: Imogene Burn. Georgette Dover, MD;  Location: [facility];  Service: General;  Laterality: N/A;  . Stomach surgery  07/07/2012    Omental patch  of gastric perforation  . Laparotomy N/A 07/13/2012    Procedure: EXPLORATORY LAPAROTOMY repair gastric leak;  Surgeon: Mariella Saa, MD;  Location: [facility];  Service: General;  Laterality: N/A;  . Laparotomy N/A 11/11/2012    Procedure: EXPLORATORY LAPAROTOMY;  Surgeon: [name]. [name], MD;  Location: [facility];  Service: General;  Laterality: N/A;  . Bowel resection N/A 11/11/2012    Procedure: SMALL BOWEL RESECTION;  Surgeon: [name]. [name], MD;  Location: [facility];  Service: General;  Laterality:  N/A;  . Minor application of wound vac N/A 11/11/2012    Procedure: APPLICATION OF WOUND VAC;  Surgeon: [name]. [name], MD;  Location: [facility];  Service: General;  Laterality: N/A;  . Jejunostomy N/A 11/11/2012    Procedure: PLACEMENT OF FEEDING JEJUNOSTOMY TUBE;  Surgeon: [name]. [name], MD;  Location: [facility];  Service: General;  Laterality: N/A;  . Lysis of adhesion N/A 11/11/2012    Procedure: LYSIS OF ADHESION;  Surgeon: [name]. [name], MD;  Location: [facility];  Service: General;  Laterality: N/A;  . Hernia repair    . Gastric fistula repair      05/16/2013   Family History  Problem Relation Age of Onset  . COPD Mother   . Hyperlipidemia Mother   . Hypertension Maternal Grandmother   . Stroke Maternal Grandmother    History  Substance Use Topics  . Smoking status: Former Smoker -- 0.50 packs/day for 35 years    Types: Cigarettes    Start date: 01/04/2003    Quit date: 04/17/2005  . Smokeless tobacco: Never Used  . Alcohol Use: No   OB History   Grav Para Term Preterm Abortions TAB SAB Ect Mult Living                 Review of Systems  Constitutional: Negative for fever and chills.  Respiratory: Negative for shortness of breath.   Cardiovascular: Negative for chest pain.  Gastrointestinal: Positive for nausea and abdominal pain. Negative for vomiting, diarrhea, constipation and blood in stool.  All other systems reviewed and are negative.      Allergies  Avelox; Betadine; Alendronate sodium; Aspirin; Codeine; Doxycycline; Fluconazole; Hydrocodone; Latex; Neurontin; Nsaids; Sertraline hcl; and Sulfa antibiotics  Home Medications   Current Outpatient Rx  Name  Route  Sig  Dispense  Refill  . albuterol (PROVENTIL HFA;VENTOLIN HFA) 108 (90 BASE) MCG/ACT inhaler   Inhalation   Inhale 2 puffs into the lungs every 6 (six) hours as needed for wheezing.         . clidinium-chlordiazePOXIDE (LIBRAX) 5-2.5 MG per capsule      TAKE ONE CAPSULE BY MOUTH 4 TIMES A DAY  BEFORE MEALS AND AT BEDTIME   120 capsule   5   . clonazePAM (KLONOPIN) 1 MG tablet      One half to one tablet at bedtime as needed   30 tablet   1   . diphenhydrAMINE (BENADRYL) 12.5 MG/5ML liquid   Oral   Take 12.5 mg by mouth 4 (four) times daily as needed for itching.          Marland Kitchen EPINEPHrine (EPIPEN) 0.3 mg/0.3 mL SOAJ   Intramuscular   Inject 0.3 mg into the muscle daily as needed (anaphylaxis).         Marland Kitchen FLUoxetine (PROZAC) 20 MG/5ML solution               . HYDROcodone-acetaminophen (NORCO) 10-325 MG per tablet   Oral  Take 1 tablet by mouth every 6 (six) hours as needed.   30 tablet   0   . metroNIDAZOLE (FLAGYL) 250 MG tablet   Oral   Take 1 tablet (250 mg total) by mouth 2 (two) times daily.   14 tablet   1   . promethazine (PHENERGAN) 12.5 MG tablet      TAKE 1 TABLET (12.5 MG TOTAL) BY MOUTH EVERY 6 (SIX) HOURS AS NEEDED FOR UP TO 7 DAYS FOR NAUSEA.   30 tablet   0    BP 123/68  Pulse 73  Temp(Src) 98.5 F (36.9 C) (Oral)  Resp 18  SpO2 97% Physical Exam  Nursing note and vitals reviewed. Constitutional: She is oriented to person, place, and time. She appears well-developed and well-nourished. No distress.  HENT:  Head: Normocephalic and atraumatic.  Right Ear: External ear normal.  Left Ear: External ear normal.  Nose: Nose normal.  Mouth/Throat: Oropharynx is clear and moist.  Eyes: Conjunctivae are normal.  Neck: Normal range of motion.  Cardiovascular: Normal rate, regular rhythm and normal heart sounds.   Pulmonary/Chest: Effort normal and breath sounds normal. No stridor. No respiratory distress. She has no wheezes. She has no rales.  Abdominal: Soft. She exhibits no distension. There is generalized tenderness.    Musculoskeletal: Normal range of motion.  Neurological: She is alert and oriented to person, place, and time. She has normal strength.  Skin: Skin is warm and dry. She is not diaphoretic. No erythema.  Psychiatric:  She has a normal mood and affect. Her behavior is normal.    ED Course  Procedures (including critical care time) Labs Review Labs Reviewed  CBC WITH DIFFERENTIAL - Abnormal; Notable for the following:    RDW 20.7 (*)    All other components within normal limits  COMPREHENSIVE METABOLIC PANEL - Abnormal; Notable for the following:    Albumin 3.3 (*)    Total Bilirubin <0.2 (*)    All other components within normal limits  LIPASE, BLOOD - Abnormal; Notable for the following:    Lipase 68 (*)    All other components within normal limits  URINE CULTURE  URINALYSIS, ROUTINE W REFLEX MICROSCOPIC   Imaging Review Ct Abdomen Pelvis W Contrast  07/09/2013   CLINICAL DATA:  Mid abdominal pain.  EXAM: CT ABDOMEN AND PELVIS WITH CONTRAST  TECHNIQUE: Multidetector CT imaging of the abdomen and pelvis was performed using the standard protocol following bolus administration of intravenous contrast.  CONTRAST:  58mL OMNIPAQUE IOHEXOL 300 MG/ML  SOLN  COMPARISON:  06/04/2013  FINDINGS: Scarring at the left lung base. Patchy densities at the right lung base are suggestive for atelectasis. No evidence for free intraperitoneal air.  Patient has a feeding tube in the left abdomen. This feeding tube has a balloon and the tube extends into the small bowel. This is consistent with a jejunal feeding tube. Postsurgical changes in the stomach. Again noted is mild intrahepatic biliary dilatation which is unchanged. Otherwise, normal appearance of the liver and portal venous system. Mild distention of the gallbladder. Normal appearance of the pancreas, spleen and adrenal glands. There are 3 stones in the right kidney and the largest measures 7 mm in the mid/lower pole. There are bilateral renal cortical cysts without hydronephrosis. The abdominal aorta is heavily calcified without aneurysm.  The uterus has been removed. There is a moderate distention of the urinary bladder. There is oral contrast within the small and  large bowel and no evidence for a  bowel obstruction. However, there is a thickened loop of small bowel in the central abdomen on sequence 2, image 48. This loop of bowel contains oral contrast with surrounding mild edema. This loop of bowel appears to be just proximal to a surgical anastomosis.  There is a persistent low-density collection in the right upper pelvis that measures up to 2.7 cm in sequence 2, image 53. This has not significantly changed since 03/19/2013.  No acute bone abnormality.  IMPRESSION: There is mild small bowel wall thickening with surrounding edema in the central abdomen. There is no evidence for a bowel obstruction. Findings suggest enteritis.  Postsurgical changes with a jejunal feeding tube.  Chronic fluid collection in the right upper pelvis.  Nonobstructive right kidney stones and multiple renal cysts.  There are patchy densities at the right lung base most likely related to atelectasis.   Electronically Signed   By: Markus Daft M.D.   On: 07/09/2013 19:54     EKG Interpretation None      6:09 PM Patient reassessed and feels significantly improved. She is in no distress, on facebook on ipad. Discussed her lab results. Patient tolerating contrast well.   MDM   Final diagnoses:  Enteritis  Abdominal pain    Patient presents to ED with worsening abdominal pain over the past week. CT abdomen shows findings suggesting enteritis. No sign of intraabdominal abscess, obstruction, or other acute pathology. Labs are unremarkable. Patient feels significantly improved after pain and nausea meds. Easily tolerates PO fluids. Patient feels comfortable and ready for discharge. She will follow up with her surgeon at her scheduled appointment next week. Return instructions given. Vital signs stable for discharge. Dr. Doy Mince evaluated patient and agrees with plan. Patient / Family / Caregiver informed of clinical course, understand medical decision-making process, and agree with  plan.     Elwyn Lade, PA-C 07/09/13 2156

## 2013-07-09 NOTE — Telephone Encounter (Signed)
Called and left message for Thayer Headings to find out what info she was needing . AC

## 2013-07-09 NOTE — Discharge Instructions (Signed)
Abdominal Pain, Adult Many things can cause belly (abdominal) pain. Most times, the belly pain is not dangerous. Many cases of belly pain can be watched and treated at home. HOME CARE   Do not take medicines that help you go poop (laxatives) unless told to by your doctor.  Only take medicine as told by your doctor.  Eat or drink as told by your doctor. Your doctor will tell you if you should be on a special diet. GET HELP IF:  You do not know what is causing your belly pain.  You have belly pain while you are sick to your stomach (nauseous) or have runny poop (diarrhea).  You have pain while you pee or poop.  Your belly pain wakes you up at night.  You have belly pain that gets worse or better when you eat.  You have belly pain that gets worse when you eat fatty foods. GET HELP RIGHT AWAY IF:   The pain does not go away within 2 hours.  You have a fever.  You keep throwing up (vomiting).  The pain changes and is only in the right or left part of the belly.  You have bloody or tarry looking poop. MAKE SURE YOU:   Understand these instructions.  Will watch your condition.  Will get help right away if you are not doing well or get worse. Document Released: 09/26/2007 Document Revised: 01/28/2013 Document Reviewed: 12/17/2012 [facility] Patient Information 2014 [place].  Colitis Colitis is inflammation of the colon. Colitis can be a short-term or long-standing (chronic) illness. Crohn's disease and ulcerative colitis are 2 types of colitis which are chronic. They usually require lifelong treatment. CAUSES  There are many different causes of colitis, including:  Viruses.  Germs (bacteria).  Medicine reactions. SYMPTOMS   Diarrhea.  Intestinal bleeding.  Pain.  Fever.  Throwing up (vomiting).  Tiredness (fatigue).  Weight loss.  Bowel blockage. DIAGNOSIS  The diagnosis of colitis is based on examination and stool or blood tests. X-rays, CT scan,  and colonoscopy may also be needed. TREATMENT  Treatment may include:  Fluids given through the vein (intravenously).  Bowel rest (nothing to eat or drink for a period of time).  Medicine for pain and diarrhea.  Medicines (antibiotics) that kill germs.  Cortisone medicines.  Surgery. HOME CARE INSTRUCTIONS   Get plenty of rest.  Drink enough water and fluids to keep your urine clear or pale yellow.  Eat a well-balanced diet.  Call your caregiver for follow-up as recommended. SEEK IMMEDIATE MEDICAL CARE IF:   You develop chills.  You have an oral temperature above 102 F (38.9 C), not controlled by medicine.  You have extreme weakness, fainting, or dehydration.  You have repeated vomiting.  You develop severe belly (abdominal) pain or are passing bloody or tarry stools. MAKE SURE YOU:   Understand these instructions.  Will watch your condition.  Will get help right away if you are not doing well or get worse. Document Released: 05/17/2004 Document Revised: 07/02/2011 Document Reviewed: 08/12/2009 [facility] Patient Information 2014 [place], [place].

## 2013-07-09 NOTE — ED Provider Notes (Signed)
Medical screening examination/treatment/procedure(s) were conducted as a shared visit with non-physician practitioner(s) and myself.  I personally evaluated the patient during the encounter.   EKG Interpretation None      68 yo female with multiple prior abdominal surgeries presenting with epigastric pain, which she states feels different than pain from her prior surgeries.  On exam, well appearing and nontoxic, not distress, abdomen soft with large ventral scar healed by secondary intent, g tube in place, TTP in periumbilical region and epigastrium.  Plan labs and CT.  Clinical Impression: 1. Enteritis   2. Abdominal pain       Houston Siren III, MD 07/10/13 [phone]

## 2013-07-10 LAB — URINE CULTURE
CULTURE: NO GROWTH
Colony Count: NO GROWTH

## 2013-07-10 NOTE — ED Provider Notes (Signed)
Medical screening examination/treatment/procedure(s) were conducted as a shared visit with non-physician practitioner(s) and myself.  I personally evaluated the patient during the encounter.   Please see my separate note.     Houston Siren III, MD 07/10/13 [phone]

## 2013-07-10 NOTE — Telephone Encounter (Signed)
Spoke to [place]- pt reported to ED last night had CT done. She has the information she needed at this point and will get the appt scheduled.

## 2013-08-03 ENCOUNTER — Ambulatory Visit (INDEPENDENT_AMBULATORY_CARE_PROVIDER_SITE_OTHER): Payer: Medicare Other | Admitting: Family Medicine

## 2013-08-03 VITALS — BP 110/60 | HR 93 | Temp 98.0°F | Resp 18 | Ht 65.0 in | Wt 100.0 lb

## 2013-08-03 DIAGNOSIS — K5732 Diverticulitis of large intestine without perforation or abscess without bleeding: Secondary | ICD-10-CM

## 2013-08-03 DIAGNOSIS — B379 Candidiasis, unspecified: Secondary | ICD-10-CM

## 2013-08-03 MED ORDER — TRAMADOL HCL 50 MG PO TABS: 50.0000 mg | ORAL_TABLET | Freq: Three times a day (TID) | ORAL | Status: DC | PRN

## 2013-08-03 MED ORDER — TRIAMCINOLONE ACETONIDE 0.1 % EX CREA: 1.0000 "application " | TOPICAL_CREAM | Freq: Two times a day (BID) | CUTANEOUS | Status: DC

## 2013-08-03 MED ORDER — FLUCONAZOLE 150 MG PO TABS: 150.0000 mg | ORAL_TABLET | Freq: Once | ORAL | Status: DC

## 2013-08-03 NOTE — Patient Instructions (Signed)
Candida Infection, Adult A candida infection (also called yeast, fungus and Monilia infection) is an overgrowth of yeast that can occur anywhere on the body. A yeast infection commonly occurs in warm, moist body areas. Usually, the infection remains localized but can spread to become a systemic infection. A yeast infection may be a sign of a more severe disease such as diabetes, leukemia, or AIDS. A yeast infection can occur in both men and women. In women, Candida vaginitis is a vaginal infection. It is one of the most common causes of vaginitis. Men usually do not have symptoms or know they have an infection until other problems develop. Men may find out they have a yeast infection because their sex partner has a yeast infection. Uncircumcised men are more likely to get a yeast infection than circumcised men. This is because the uncircumcised glans is not exposed to air and does not remain as dry as that of a circumcised glans. Older adults may develop yeast infections around dentures. CAUSES  Women  Antibiotics.  Steroid medication taken for a long time.  Being overweight (obese).  Diabetes.  Poor immune condition.  Certain serious medical conditions.  Immune suppressive medications for organ transplant patients.  Chemotherapy.  Pregnancy.  Menstration.  Stress and fatigue.  Intravenous drug use.  Oral contraceptives.  Wearing tight-fitting clothes in the crotch area.  Catching it from a sex partner who has a yeast infection.  Spermicide.  Intravenous, urinary, or other catheters. Men  Catching it from a sex partner who has a yeast infection.  Having oral or anal sex with a person who has the infection.  Spermicide.  Diabetes.  Antibiotics.  Poor immune system.  Medications that suppress the immune system.  Intravenous drug use.  Intravenous, urinary, or other catheters. SYMPTOMS  Women  Thick, white vaginal discharge.  Vaginal itching.  Redness and  swelling in and around the vagina.  Irritation of the lips of the vagina and perineum.  Blisters on the vaginal lips and perineum.  Painful sexual intercourse.  Low blood sugar (hypoglycemia).  Painful urination.  Bladder infections.  Intestinal problems such as constipation, indigestion, bad breath, bloating, increase in gas, diarrhea, or loose stools. Men  Men may develop intestinal problems such as constipation, indigestion, bad breath, bloating, increase in gas, diarrhea, or loose stools.  Dry, cracked skin on the penis with itching or discomfort.  Jock itch.  Dry, flaky skin.  Athlete's foot.  Hypoglycemia. DIAGNOSIS  Women  A history and an exam are performed.  The discharge may be examined under a microscope.  A culture may be taken of the discharge. Men  A history and an exam are performed.  Any discharge from the penis or areas of cracked skin will be looked at under the microscope and cultured.  Stool samples may be cultured. TREATMENT  Women  Vaginal antifungal suppositories and creams.  Medicated creams to decrease irritation and itching on the outside of the vagina.  Warm compresses to the perineal area to decrease swelling and discomfort.  Oral antifungal medications.  Medicated vaginal suppositories or cream for repeated or recurrent infections.  Wash and dry the irritation areas before applying the cream.  Eating yogurt with lactobacillus may help with prevention and treatment.  Sometimes painting the vagina with gentian violet solution may help if creams and suppositories do not work. Men  Antifungal creams and oral antifungal medications.  Sometimes treatment must continue for 30 days after the symptoms go away to prevent recurrence. HOME CARE  INSTRUCTIONS  Women  Use cotton underwear and avoid tight-fitting clothing.  Avoid colored, scented toilet paper and deodorant tampons or pads.  Do not douche.  Keep your diabetes  under control.  Finish all the prescribed medications.  Keep your skin clean and dry.  Consume milk or yogurt with lactobacillus active culture regularly. If you get frequent yeast infections and think that is what the infection is, there are over-the-counter medications that you can get. If the infection does not show healing in 3 days, talk to your caregiver.  Tell your sex partner you have a yeast infection. Your partner may need treatment also, especially if your infection does not clear up or recurs. Men  Keep your skin clean and dry.  Keep your diabetes under control.  Finish all prescribed medications.  Tell your sex partner that you have a yeast infection so they can be treated if necessary. SEEK MEDICAL CARE IF:   Your symptoms do not clear up or worsen in one week after treatment.  You have an oral temperature above 102 F (38.9 C).  You have trouble swallowing or eating for a prolonged time.  You develop blisters on and around your vagina.  You develop vaginal bleeding and it is not your menstrual period.  You develop abdominal pain.  You develop intestinal problems as mentioned above.  You get weak or lightheaded.  You have painful or increased urination.  You have pain during sexual intercourse. MAKE SURE YOU:   Understand these instructions.  Will watch your condition.  Will get help right away if you are not doing well or get worse. Document Released: 05/17/2004 Document Revised: 07/02/2011 Document Reviewed: 08/29/2009 [facility] Patient Information 2014 [place].

## 2013-08-03 NOTE — Progress Notes (Signed)
This chart was scribed for Robyn Haber, MD by Roxan Diesel, Scribe.  This patient was seen in [facility] and the patient's care was started at 7:25 PM.  @[facility] @  Patient ID: Cynthia Bowen MRN: [id number], DOB: 09-28-1945, 68 y.o. Date of Encounter: 08/03/2013, 7:24 PM  Primary Physician: [name] Koyanagi, MD  Chief Complaint: Vaginal Itching  HPI: 68 y.o. year old female with history below presents with vaginal itching.  Pt reports she recently had diverticulitis and was placed on 7 days each of Flagyll and Cipro.  She has since developed itching in her vaginal and anal area.  Pt has never used estrogen cream.  She also reports continued abdominal pain and nausea due to her diverticulitis.   Past Medical History  Diagnosis Date   COPD (chronic obstructive pulmonary disease)    Pneumonia 12-2011   GERD (gastroesophageal reflux disease)    Headache(784.0)    Arthritis     osteoarthritis   Allergy    Depression    Neuromuscular disorder    Osteoporosis    Bronchitis     CURRENTLY AS OF 06/30/12 - HAS COUGH AND FINISHED ANTIBIOTIC FOR BRONCHITIS   Fibromyalgia    Pain     ABDOMINAL PAIN AND NAUSEA   Pain     SOMETIMES PAIN RIGHT EAR AND NECK--STATES CAUSED BY A "LUMP" ON BACK OF EAR--USES KENALOG CREAM TOPICALLY AS NEEDED.   Gastrocutaneous fistula    Anemia    Anxiety    Blood transfusion without reported diagnosis      Home Meds: Prior to Admission medications   Medication Sig Start Date End Date Taking? Authorizing Provider  albuterol (PROVENTIL HFA;VENTOLIN HFA) 108 (90 BASE) MCG/ACT inhaler Inhale 2 puffs into the lungs every 6 (six) hours as needed for wheezing.   Yes Historical Provider, MD  diphenhydrAMINE (BENADRYL) 12.5 MG/5ML liquid Take 12.5 mg by mouth 4 (four) times daily as needed for itching.    Yes Historical Provider, MD  EPINEPHrine (EPIPEN) 0.3 mg/0.3 mL SOAJ Inject 0.3 mg into the muscle daily as needed (anaphylaxis).    Yes Historical Provider, MD    Allergies:  Allergies  Allergen Reactions   Avelox [Moxifloxacin Hcl In Nacl] Nausea And Vomiting   Betadine [Povidone Iodine] Itching and Rash   Alendronate Sodium Nausea And Vomiting   Aspirin Nausea Only   Codeine Nausea And Vomiting   Doxycycline     Pt doesn't remember reaction   Fluconazole     Pt doesn't remember reaction   Hydrocodone Nausea And Vomiting    GI distress   Latex    Neurontin [Gabapentin] Other (See Comments)    Mood changes    Nsaids Other (See Comments)    Severe gastritis & perforation - avoid NSAIDs when possible   Sertraline Hcl Other (See Comments)    Hallucinations    Sulfa Antibiotics Rash    History   Social History   Marital Status: Single    Spouse Name: N/A    Number of Children: 0   Years of Education: N/A   Occupational History       Social History Main Topics   Smoking status: Former Smoker -- 0.50 packs/day for 35 years    Types: Cigarettes    Start date: 01/04/2003    Quit date: 04/17/2005   Smokeless tobacco: Never Used   Alcohol Use: No   Drug Use: No   Sexual Activity: No   Other Topics Concern   Not on file  Social History Narrative   Single. Education: [employer]. Exercise.     Review of Systems: Constitutional: negative for chills, fever, night sweats, weight changes, or fatigue  HEENT: negative for vision changes, hearing loss, congestion, rhinorrhea, ST, epistaxis, or sinus pressure Cardiovascular: negative for chest pain or palpitations Respiratory: negative for hemoptysis, wheezing, shortness of breath, or cough Dermatological: negative for rash Abdominal: positive for abdominal pain and nausea GU: positive for vaginal itching and anal itching Neurologic: negative for headache, dizziness, or syncope All other systems reviewed and are otherwise negative with the exception to those above and in the HPI.   Physical Exam: Blood pressure 110/60, pulse 93,  temperature 98 F (36.7 C), temperature source Oral, resp. rate 18, height 5\' 5"  (1.651 m), weight 100 lb (45.36 kg), SpO2 100.00%., Body mass index is 16.64 kg/(m^2). General: Well developed, well nourished, in no acute distress. Head: Normocephalic, atraumatic, eyes without discharge, sclera non-icteric, nares are without discharge. Bilateral auditory canals clear, TM's are without perforation, pearly grey and translucent with reflective cone of light bilaterally. Oral cavity moist, posterior pharynx without exudate, erythema, peritonsillar abscess, or post nasal drip.  Neck: Supple. No thyromegaly. Full ROM. No lymphadenopathy. Lungs: Clear bilaterally to auscultation without wheezes, rales, or rhonchi. Breathing is unlabored. Heart: RRR with S1 S2. No murmurs, rubs, or gallops appreciated. Abdomen: Tender in LLQ, associated with a ventral hernia.  Soft, non-distended with normoactive bowel sounds. No hepatomegaly. No rebound/guarding.  GU: Perineum is excoriated, reddened, with white exudate at the vulva. Msk:  Strength and tone normal for age. Extremities/Skin: Warm and dry. No clubbing or cyanosis. No edema. No rashes or suspicious lesions. Neuro: Alert and oriented X 3. Moves all extremities spontaneously. Gait is normal. CNII-XII grossly in tact. Psych:  Responds to questions appropriately with a normal affect.     ASSESSMENT AND PLAN:  68 y.o. year old female with  1. Monilia infection   2. Diverticulitis of colon (without mention of hemorrhage)      Signed, Robyn Haber, MD 08/03/2013 7:24 PM

## 2013-08-13 ENCOUNTER — Ambulatory Visit: Payer: Medicare Other

## 2013-08-13 ENCOUNTER — Ambulatory Visit (INDEPENDENT_AMBULATORY_CARE_PROVIDER_SITE_OTHER): Payer: Medicare Other | Admitting: Emergency Medicine

## 2013-08-13 VITALS — BP 120/78 | HR 94 | Temp 98.4°F | Resp 18 | Ht 65.0 in | Wt 95.0 lb

## 2013-08-13 DIAGNOSIS — M545 Low back pain, unspecified: Secondary | ICD-10-CM

## 2013-08-13 DIAGNOSIS — M79604 Pain in right leg: Secondary | ICD-10-CM

## 2013-08-13 DIAGNOSIS — R319 Hematuria, unspecified: Secondary | ICD-10-CM

## 2013-08-13 DIAGNOSIS — Z87442 Personal history of urinary calculi: Secondary | ICD-10-CM

## 2013-08-13 LAB — POCT URINALYSIS DIPSTICK
Bilirubin, UA: NEGATIVE
Glucose, UA: NEGATIVE
KETONES UA: NEGATIVE
Leukocytes, UA: NEGATIVE
Nitrite, UA: NEGATIVE
PH UA: 5.5
PROTEIN UA: 30
SPEC GRAV UA: 1.025
Urobilinogen, UA: 0.2

## 2013-08-13 LAB — POCT UA - MICROSCOPIC ONLY
CRYSTALS, UR, HPF, POC: NEGATIVE
Mucus, UA: POSITIVE
Yeast, UA: NEGATIVE

## 2013-08-13 NOTE — Progress Notes (Addendum)
Subjective:    Patient ID: Cynthia Bowen, female    DOB: 03/22/1946, 68 y.o.   MRN: [id number] This chart was scribed for Cynthia Queen, MD by Rolanda Lundborg, ED Scribe. This patient was seen in room 1 and the patient's care was started at 11:47 AM.  Chief Complaint  Patient presents with  . Back Pain    x 4 days    HPI HPI Comments: [name] is a 68 y.o. female with a h/o PE, COPD, HTN, DM, and fibromyalgia who presents to the [facility] complaining of moderate lower back pain that radiates down the right leg onset 4 days ago. She states moving from sitting to standing and vice versa makes the pain worse. She denies h/o back pain. She denies injuries or falls. She states she tried taking the pain meds she has for her abdominal pain with no relief. She is a former smoker.   PCP - [name] Koyanagi, MD  Past Medical History  Diagnosis Date  . COPD (chronic obstructive pulmonary disease)   . Pneumonia 12-2011  . GERD (gastroesophageal reflux disease)   . Headache(784.0)   . Arthritis     osteoarthritis  . Allergy   . Depression   . Neuromuscular disorder   . Osteoporosis   . Bronchitis     CURRENTLY AS OF 06/30/12 - HAS COUGH AND FINISHED ANTIBIOTIC FOR BRONCHITIS  . Fibromyalgia   . Pain     ABDOMINAL PAIN AND NAUSEA  . Pain     SOMETIMES PAIN RIGHT EAR AND NECK--STATES CAUSED BY A "LUMP" ON BACK OF EAR--USES KENALOG CREAM TOPICALLY AS NEEDED.  Marland Kitchen Gastrocutaneous fistula   . Anemia   . Anxiety   . Blood transfusion without reported diagnosis    Current Outpatient Prescriptions on File Prior to Visit  Medication Sig Dispense Refill  . albuterol (PROVENTIL HFA;VENTOLIN HFA) 108 (90 BASE) MCG/ACT inhaler Inhale 2 puffs into the lungs every 6 (six) hours as needed for wheezing.      . diphenhydrAMINE (BENADRYL) 12.5 MG/5ML liquid Take 12.5 mg by mouth 4 (four) times daily as needed for itching.       Marland Kitchen EPINEPHrine (EPIPEN) 0.3 mg/0.3 mL SOAJ Inject  0.3 mg into the muscle daily as needed (anaphylaxis).      . fluconazole (DIFLUCAN) 150 MG tablet Take 1 tablet (150 mg total) by mouth once.  1 tablet  0  . traMADol (ULTRAM) 50 MG tablet Take 1 tablet (50 mg total) by mouth every 8 (eight) hours as needed.  30 tablet  0  . triamcinolone cream (KENALOG) 0.1 % Apply 1 application topically 2 (two) times daily.  30 g  0   No current facility-administered medications on file prior to visit.   Allergies  Allergen Reactions  . Avelox [Moxifloxacin Hcl In Nacl] Nausea And Vomiting  . Betadine [Povidone Iodine] Itching and Rash  . Alendronate Sodium Nausea And Vomiting  . Aspirin Nausea Only  . Codeine Nausea And Vomiting  . Doxycycline     Pt doesn't remember reaction  . Fluconazole     Pt doesn't remember reaction  . Hydrocodone Nausea And Vomiting    GI distress  . Latex   . Neurontin [Gabapentin] Other (See Comments)    Mood changes   . Nsaids Other (See Comments)    Severe gastritis & perforation - avoid NSAIDs when possible  . Sertraline Hcl Other (See Comments)    Hallucinations   .  Sulfa Antibiotics Rash     Review of Systems  Musculoskeletal: Positive for back pain.  Neurological: Negative for weakness and numbness.       Objective:   Physical Exam CONSTITUTIONAL: Well developed/well nourished HEAD: Normocephalic/atraumatic EYES: EOMI/PERRL ENMT: Mucous membranes moist NECK: supple no meningeal signs SPINE: Tender L5 S1 on the left. Knee reflexes are 2+ and symmetrical. No ankle reflexes. No weakness. CV: S1/S2 noted, no murmurs/rubs/gallops noted LUNGS: Lungs are clear to auscultation bilaterally, no apparent distress ABDOMEN: soft, nontender, no rebound or guarding GU:no cva tenderness NEURO: Pt is awake/alert, moves all extremitiesx4 EXTREMITIES: pulses normal, full ROM SKIN: warm, color normal PSYCH: no abnormalities of mood noted   Filed Vitals:   08/13/13 1026  BP: 140/90  Pulse: 138  Temp: 98.4 F  (36.9 C)  TempSrc: Oral  Resp: 18  Height: 5\' 5"  (1.651 m)  Weight: 95 lb (43.092 kg)  SpO2: 95%    [facility] reading (PRIMARY) by  Dr. Everlene Farrier there is significant L5-S1 disc disease. There is calcification in the vascular system. There are circular calcifications in the right upper abdomen  Results for orders placed in visit on 08/13/13  POCT UA - MICROSCOPIC ONLY      Result Value Ref Range   WBC, Ur, HPF, POC 3-5     RBC, urine, microscopic 25-40     Bacteria, U Microscopic trace     Mucus, UA positive     Epithelial cells, urine per micros 0-2     Crystals, Ur, HPF, POC neg     Casts, Ur, LPF, POC hyaline     Yeast, UA neg    POCT URINALYSIS DIPSTICK      Result Value Ref Range   Color, UA yellow     Clarity, UA clear     Glucose, UA neg     Bilirubin, UA neg     Ketones, UA neg     Spec Grav, UA 1.025     Blood, UA large     pH, UA 5.5     Protein, UA 30     Urobilinogen, UA 0.2     Nitrite, UA neg     Leukocytes, UA Negative         Assessment & Plan:  **Disclaimer: This note was dictated with voice recognition software. Similar sounding words can inadvertently be transcribed and this note may contain transcription errors which may not have been corrected upon publication of note.** X-ray shows severe L5-S1 lumbar disc disease. She does have a history kidney stones by CT and has significant hematuria today . It is not clear to me whether this pain is coming from her back or coming from kidney stones. She definitely has significant hematuria . She also has high risk for bladder cancer with her heavy smoking history patient went to the urologist office. When I was reviewed in the chart I noted that her heart rate was 138 when she was seen here earlier today. I called her and asked her to come by so I can repeat that and verify whether she is truly tachycardic. Patient did return to clinic. I repeated her cardiac exam. Blood pressure was 120/80 and heart rate was 97 and regular.  Pulse ox was 98

## 2013-08-17 ENCOUNTER — Ambulatory Visit (INDEPENDENT_AMBULATORY_CARE_PROVIDER_SITE_OTHER): Payer: Medicare Other | Admitting: Internal Medicine

## 2013-08-17 VITALS — BP 134/86 | HR 80 | Temp 99.0°F | Resp 18 | Ht 63.25 in | Wt 98.8 lb

## 2013-08-17 DIAGNOSIS — R209 Unspecified disturbances of skin sensation: Secondary | ICD-10-CM

## 2013-08-17 DIAGNOSIS — L7682 Other postprocedural complications of skin and subcutaneous tissue: Secondary | ICD-10-CM

## 2013-08-17 DIAGNOSIS — S339XXA Sprain of unspecified parts of lumbar spine and pelvis, initial encounter: Secondary | ICD-10-CM

## 2013-08-17 DIAGNOSIS — S335XXA Sprain of ligaments of lumbar spine, initial encounter: Secondary | ICD-10-CM

## 2013-08-17 DIAGNOSIS — S39012A Strain of muscle, fascia and tendon of lower back, initial encounter: Secondary | ICD-10-CM

## 2013-08-17 MED ORDER — CYCLOBENZAPRINE HCL 10 MG PO TABS: 10.0000 mg | ORAL_TABLET | Freq: Every day | ORAL | Status: DC

## 2013-08-17 MED ORDER — GABAPENTIN 300 MG PO CAPS: 300.0000 mg | ORAL_CAPSULE | Freq: Every day | ORAL | Status: DC

## 2013-08-17 MED ORDER — TRAMADOL HCL 50 MG PO TABS: 50.0000 mg | ORAL_TABLET | Freq: Four times a day (QID) | ORAL | Status: DC | PRN

## 2013-08-17 NOTE — Patient Instructions (Addendum)
5/6 at 10am at the [facility]  Tramadol 1-2 tablets every six hours as needed for pain control Gabapentin take 1 tablet at bedtime for the first 3 days and 2 tablets at bedtime after that Flexeril 10 mg 1 tablet at bedtime

## 2013-08-17 NOTE — Progress Notes (Signed)
Subjective:     Patient ID: Cynthia Bowen, female   DOB: Jan 25, 1946, 68 y.o.   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 She hasn't taken medicine for this in the past post-op but is currently only taking meloxicam with no relief. She also endorses low back pain that extends from one side of the low back all the way across. She states that this pain is different than her low back pain in the past for which she had surgery. She said this is a dull pain that does not radiate anywhere but is "straight across the small of her back." She hasn't taken any medicine for this pain specifically but says the it has gotten worse and began 2 sundays ago. She was seen on 4/23 by Dr. Everlene Farrier for similar complaints was found to hav large amounts of bolood in her urine, was sent to urology who ruled out bladder cancer.     Review of Systems  Constitutional: Positive for activity change and fatigue. Negative for fever, chills and diaphoresis.       Decreased appetite with weight loss s/p surgery  HENT: Negative for congestion, ear pain, rhinorrhea, sinus pressure, sneezing and sore throat.   Eyes: Negative for pain and redness.  Respiratory: Negative for cough, chest tightness, shortness of breath and wheezing.   Cardiovascular: Negative for chest pain, palpitations and leg swelling.  Gastrointestinal: Positive for abdominal pain and abdominal distention. Negative for nausea, vomiting, diarrhea, constipation and blood in stool.  Endocrine: Negative for polyuria.  Genitourinary: Negative for dysuria, enuresis and difficulty urinating.       Denies frank blood in the urine  Musculoskeletal: Positive for myalgias. Negative for arthralgias and joint swelling.       Low back pain that goes all the way  across her back  Allergic/Immunologic: Negative for environmental allergies.  Neurological: Negative for dizziness, syncope, weakness and headaches.   Past Medical History  Diagnosis Date  . COPD (chronic obstructive pulmonary disease)   . Pneumonia 12-2011  . GERD (gastroesophageal reflux disease)   . Headache(784.0)   . Arthritis     osteoarthritis  . Allergy   . Depression   . Neuromuscular disorder   . Osteoporosis   . Bronchitis     CURRENTLY AS OF 06/30/12 - HAS COUGH AND FINISHED ANTIBIOTIC FOR BRONCHITIS  . Fibromyalgia   . Pain     ABDOMINAL PAIN AND NAUSEA  . Pain     SOMETIMES PAIN RIGHT EAR AND NECK--STATES CAUSED BY A "LUMP" ON BACK OF EAR--USES KENALOG CREAM TOPICALLY AS NEEDED.  Marland Kitchen Gastrocutaneous fistula   . Anemia   . Anxiety   . Blood transfusion without reported diagnosis     Current Outpatient Prescriptions on File Prior to Visit  Medication Sig Dispense Refill  . albuterol (PROVENTIL HFA;VENTOLIN HFA) 108 (90 BASE) MCG/ACT inhaler Inhale 2 puffs into the lungs every 6 (six) hours as needed for wheezing.      . diphenhydrAMINE (BENADRYL) 12.5 MG/5ML liquid Take 12.5 mg by mouth 4 (four) times daily as needed for itching.       Marland Kitchen EPINEPHrine (EPIPEN) 0.3 mg/0.3 mL SOAJ Inject 0.3 mg into the muscle daily as needed (anaphylaxis).      . triamcinolone cream (KENALOG) 0.1 % Apply 1  application topically 2 (two) times daily.  30 g  0  . fluconazole (DIFLUCAN) 150 MG tablet Take 1 tablet (150 mg total) by mouth once.  1 tablet  0  . traMADol (ULTRAM) 50 MG tablet Take 1 tablet (50 mg total) by mouth every 8 (eight) hours as needed.  30 tablet  0   No current facility-administered medications on file prior to visit.        Objective:   Physical Exam  Constitutional: She is oriented to person, place, and time. She appears well-developed. No distress.  Mildly cachectic  HENT:  Head: Normocephalic and atraumatic.  Right Ear: External ear normal.  Left Ear:  External ear normal.  Mouth/Throat: Oropharynx is clear and moist.  Eyes: Conjunctivae are normal. Pupils are equal, round, and reactive to light.  Cardiovascular: Normal rate, regular rhythm, normal heart sounds and intact distal pulses.  Exam reveals no gallop and no friction rub.   No murmur heard. Pulmonary/Chest: Effort normal and breath sounds normal. No respiratory distress. She has no wheezes. She has no rales.  Abdominal: Soft. Bowel sounds are normal. She exhibits no distension. There is tenderness. There is guarding. There is no rebound.  Tenderness around incisional cite  Musculoskeletal:  Decreased ROM in flexion and sidebending of the back tenderness across the entire lower latissumus dorsi - SLR - FABER  Neurological: She is alert and oriented to person, place, and time. No cranial nerve deficit.  Skin: Skin is warm and dry. She is not diaphoretic. No erythema.  Wound healing well along vertical abdominal incision      BP 134/86  Pulse 80  Temp(Src) 99 F (37.2 C) (Oral)  Resp 18  Ht 5' 3.25" (1.607 m)  Wt 98 lb 12.8 oz (44.815 kg)  BMI 17.35 kg/m2  SpO2 100%  Assessment:     Incisional pain  Lumbosacral strain      Plan:     5/6 appt at 10am at the [facility]  Tramadol 50 mg 1-2 tablets every six hours as needed for pain control Gabapentin 300 mg take 1 tablet at bedtime for the first 3 days and 2 tablets every night at bedtime after Flexeril 10 mg 1 tablet at bedtime     I have completed the patient encounter in its entirety as documented by [facility], with editing by me where necessary. Kae Lauman P. Laney Pastor, M.D.

## 2013-08-22 ENCOUNTER — Encounter (HOSPITAL_COMMUNITY): Payer: Self-pay | Admitting: Emergency Medicine

## 2013-08-22 ENCOUNTER — Emergency Department (HOSPITAL_COMMUNITY): Payer: No Typology Code available for payment source

## 2013-08-22 ENCOUNTER — Emergency Department (HOSPITAL_COMMUNITY)
Admission: EM | Admit: 2013-08-22 | Discharge: 2013-08-22 | Disposition: A | Discharge status: Home / Self Care | Payer: No Typology Code available for payment source | Attending: Emergency Medicine | Admitting: Emergency Medicine

## 2013-08-22 DIAGNOSIS — S0083XA Contusion of other part of head, initial encounter: Principal | ICD-10-CM | POA: Insufficient documentation

## 2013-08-22 DIAGNOSIS — J449 Chronic obstructive pulmonary disease, unspecified: Secondary | ICD-10-CM | POA: Insufficient documentation

## 2013-08-22 DIAGNOSIS — F329 Major depressive disorder, single episode, unspecified: Secondary | ICD-10-CM | POA: Insufficient documentation

## 2013-08-22 DIAGNOSIS — IMO0001 Reserved for inherently not codable concepts without codable children: Secondary | ICD-10-CM | POA: Insufficient documentation

## 2013-08-22 DIAGNOSIS — Z8719 Personal history of other diseases of the digestive system: Secondary | ICD-10-CM | POA: Insufficient documentation

## 2013-08-22 DIAGNOSIS — Y92410 Unspecified street and highway as the place of occurrence of the external cause: Secondary | ICD-10-CM | POA: Insufficient documentation

## 2013-08-22 DIAGNOSIS — Z87891 Personal history of nicotine dependence: Secondary | ICD-10-CM | POA: Insufficient documentation

## 2013-08-22 DIAGNOSIS — S1093XA Contusion of unspecified part of neck, initial encounter: Principal | ICD-10-CM

## 2013-08-22 DIAGNOSIS — F3289 Other specified depressive episodes: Secondary | ICD-10-CM | POA: Insufficient documentation

## 2013-08-22 DIAGNOSIS — F411 Generalized anxiety disorder: Secondary | ICD-10-CM | POA: Insufficient documentation

## 2013-08-22 DIAGNOSIS — G709 Myoneural disorder, unspecified: Secondary | ICD-10-CM | POA: Insufficient documentation

## 2013-08-22 DIAGNOSIS — M1990 Unspecified osteoarthritis, unspecified site: Secondary | ICD-10-CM | POA: Insufficient documentation

## 2013-08-22 DIAGNOSIS — S0003XA Contusion of scalp, initial encounter: Secondary | ICD-10-CM | POA: Insufficient documentation

## 2013-08-22 DIAGNOSIS — J4489 Other specified chronic obstructive pulmonary disease: Secondary | ICD-10-CM | POA: Insufficient documentation

## 2013-08-22 DIAGNOSIS — Z862 Personal history of diseases of the blood and blood-forming organs and certain disorders involving the immune mechanism: Secondary | ICD-10-CM | POA: Insufficient documentation

## 2013-08-22 DIAGNOSIS — Z79899 Other long term (current) drug therapy: Secondary | ICD-10-CM | POA: Insufficient documentation

## 2013-08-22 DIAGNOSIS — Z791 Long term (current) use of non-steroidal anti-inflammatories (NSAID): Secondary | ICD-10-CM | POA: Insufficient documentation

## 2013-08-22 DIAGNOSIS — Z91040 Latex allergy status: Secondary | ICD-10-CM | POA: Insufficient documentation

## 2013-08-22 DIAGNOSIS — Y9389 Activity, other specified: Secondary | ICD-10-CM | POA: Insufficient documentation

## 2013-08-22 DIAGNOSIS — IMO0002 Reserved for concepts with insufficient information to code with codable children: Secondary | ICD-10-CM | POA: Insufficient documentation

## 2013-08-22 DIAGNOSIS — Z8701 Personal history of pneumonia (recurrent): Secondary | ICD-10-CM | POA: Insufficient documentation

## 2013-08-22 MED ORDER — TRAMADOL HCL 50 MG PO TABS
50.0000 mg | ORAL_TABLET | Freq: Once | ORAL | Status: AC
  Administered 2013-08-22: 50 mg via ORAL
  Filled 2013-08-22: qty 1
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
## 2013-08-22 NOTE — Discharge Instructions (Signed)
Continue taking your tramadol for pain.   Stay hydrated.  You may be stiff and sore tomorrow. The hematoma may get larger.   Follow up with your doctor.   Return to ER if you have severe pain, vomiting, worse headaches.

## 2013-08-23 ENCOUNTER — Ambulatory Visit: Payer: Medicare Other

## 2013-08-23 ENCOUNTER — Ambulatory Visit (INDEPENDENT_AMBULATORY_CARE_PROVIDER_SITE_OTHER): Payer: Medicare Other | Admitting: Internal Medicine

## 2013-08-23 VITALS — BP 88/60 | HR 137 | Temp 98.6°F | Ht 63.25 in | Wt 97.4 lb

## 2013-08-23 DIAGNOSIS — M79672 Pain in left foot: Secondary | ICD-10-CM

## 2013-08-23 DIAGNOSIS — M79609 Pain in unspecified limb: Secondary | ICD-10-CM

## 2013-08-23 DIAGNOSIS — L7682 Other postprocedural complications of skin and subcutaneous tissue: Secondary | ICD-10-CM

## 2013-08-23 MED ORDER — GABAPENTIN 300 MG PO CAPS: 600.0000 mg | ORAL_CAPSULE | Freq: Every day | ORAL | Status: DC
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
## 2013-10-05 ENCOUNTER — Ambulatory Visit (INDEPENDENT_AMBULATORY_CARE_PROVIDER_SITE_OTHER): Payer: Medicare Other

## 2013-10-05 ENCOUNTER — Ambulatory Visit (INDEPENDENT_AMBULATORY_CARE_PROVIDER_SITE_OTHER): Payer: Medicare Other | Admitting: Emergency Medicine

## 2013-10-05 VITALS — BP 100/70 | HR 103 | Temp 97.6°F | Resp 17 | Ht 65.5 in | Wt 92.0 lb

## 2013-10-05 DIAGNOSIS — R634 Abnormal weight loss: Secondary | ICD-10-CM

## 2013-10-05 DIAGNOSIS — K567 Ileus, unspecified: Secondary | ICD-10-CM

## 2013-10-05 DIAGNOSIS — E43 Unspecified severe protein-calorie malnutrition: Secondary | ICD-10-CM

## 2013-10-05 DIAGNOSIS — K929 Disease of digestive system, unspecified: Secondary | ICD-10-CM

## 2013-10-05 DIAGNOSIS — R109 Unspecified abdominal pain: Secondary | ICD-10-CM

## 2013-10-05 DIAGNOSIS — K9189 Other postprocedural complications and disorders of digestive system: Secondary | ICD-10-CM

## 2013-10-05 LAB — COMPREHENSIVE METABOLIC PANEL
ALT: 8 U/L (ref 0–35)
AST: 14 U/L (ref 0–37)
Albumin: 3.9 g/dL (ref 3.5–5.2)
Alkaline Phosphatase: 87 U/L (ref 39–117)
BUN: 9 mg/dL (ref 6–23)
CO2: 25 meq/L (ref 19–32)
CREATININE: 0.8 mg/dL (ref 0.50–1.10)
Calcium: 10 mg/dL (ref 8.4–10.5)
Chloride: 105 mEq/L (ref 96–112)
Glucose, Bld: 88 mg/dL (ref 70–99)
Potassium: 4.3 mEq/L (ref 3.5–5.3)
Sodium: 141 mEq/L (ref 135–145)
TOTAL PROTEIN: 7.5 g/dL (ref 6.0–8.3)
Total Bilirubin: 0.6 mg/dL (ref 0.2–1.2)

## 2013-10-05 LAB — POCT URINALYSIS DIPSTICK
Glucose, UA: NEGATIVE
Ketones, UA: 15
NITRITE UA: NEGATIVE
Protein, UA: 100
SPEC GRAV UA: 1.015
Urobilinogen, UA: 1
pH, UA: 6

## 2013-10-05 LAB — POCT UA - MICROSCOPIC ONLY
Casts, Ur, LPF, POC: NEGATIVE
Crystals, Ur, HPF, POC: NEGATIVE
MUCUS UA: NEGATIVE
Yeast, UA: NEGATIVE

## 2013-10-05 LAB — POCT CBC
Granulocyte percent: 73.3 %G (ref 37–80)
HEMATOCRIT: 40 % (ref 37.7–47.9)
HEMOGLOBIN: 13 g/dL (ref 12.2–16.2)
LYMPH, POC: 2.4 (ref 0.6–3.4)
MCH: 29.5 pg (ref 27–31.2)
MCHC: 32.5 g/dL (ref 31.8–35.4)
MCV: 90.8 fL (ref 80–97)
MID (cbc): 0.6 (ref 0–0.9)
MPV: 9 fL (ref 0–99.8)
POC Granulocyte: 8.4 — AB (ref 2–6.9)
POC LYMPH PERCENT: 21.4 %L (ref 10–50)
POC MID %: 5.3 %M (ref 0–12)
Platelet Count, POC: 408 10*3/uL (ref 142–424)
RBC: 4.41 M/uL (ref 4.04–5.48)
RDW, POC: 15.9 %
WBC: 11.4 10*3/uL — AB (ref 4.6–10.2)

## 2013-10-05 MED ORDER — PROMETHAZINE HCL 25 MG/ML IJ SOLN: 25.0000 mg | Freq: Four times a day (QID) | INTRAMUSCULAR | Status: DC | PRN

## 2013-10-05 MED ORDER — PROMETHAZINE HCL 25 MG/ML IJ SOLN
12.5000 mg | Freq: Once | INTRAMUSCULAR | Status: AC
  Administered 2013-10-05: 12.5 mg via INTRAMUSCULAR

## 2013-10-05 NOTE — Progress Notes (Signed)
[facility] [address], [place] [address] [phone]  Date:  10/05/2013   Name:  Cynthia Bowen   DOB:  [birth year]   MRN:  [id number]  PCP:  [name] Koyanagi, MD    Chief Complaint: Nausea, Emesis and Abdominal Pain   History of Present Illness:  Cynthia Bowen is a 68 y.o. very pleasant female patient who presents with the following:  History of seven abdominal procedures most recent in December for partial gastrectomy at [facility].  She had two fistulas, one gastrocutaneous and the other not described on available records.  She underwent a year of tube feedings with a jejunostomy tube.  She has been ill since Saturday and had persistent nausea and vomiting.  She has improved today with no vomiting today.  She is spitting her saliva to "prevent" nausea.  Says she is passing flatus.  No  fever or chills.  Occasional cough not productive. No GU or GYN symptoms.  Abdominal pain is generalized.  Pain much worse with bumps and railroad tracks.  No improvement with over the counter medications or other home remedies. Denies other complaint or health concern today.   Patient Active Problem List   Diagnosis Date Noted  . Abdominal wall abscess 03/19/2013  . Cellulitis and abscess of trunk 02/22/2013  . Abdominal wall cellulitis 02/22/2013  . Protein calorie malnutrition 01/15/2013  . Iron deficiency anemia 01/15/2013  . Jejunostomy tube in situ 01/09/2013  . Acute respiratory failure 11/11/2012  . Altered mental status 11/11/2012  . HTN (hypertension) 11/11/2012  . Diabetes 11/11/2012  . 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 Hemoptysis 08/06/2012  . PE (pulmonary embolism) 08/05/2012  . Abdominal pain 08/05/2012  . Incarcerated epigastric hernia s/p primary repair 07/02/2012 07/11/2012  . Protein-calorie malnutrition, severe 07/11/2012  . 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 [id number] 07/10/2012  . Loss of weight  06/19/2012  . Anorexia 06/19/2012  . Unspecified gastritis and gastroduodenitis without mention of hemorrhage 04/18/2012  . Pericardial effusion 04/17/2012  . COPD GOLD I 01/29/2012  . Community acquired pneumonia 12/29/2011  . Back pain 12/29/2011  . Depression 04/02/2011  . GERD (gastroesophageal reflux disease) 04/02/2011  . Osteoarthritis 04/02/2011  . Cervical neck pain with evidence of disc disease 04/02/2011  . Fibromyalgia 04/02/2011    Past Medical History  Diagnosis Date  . COPD (chronic obstructive pulmonary disease)   . Pneumonia 12-2011  . GERD (gastroesophageal reflux disease)   . Headache(784.0)   . Arthritis     osteoarthritis  . Allergy   . Depression   . Neuromuscular disorder   . Osteoporosis   . Bronchitis     CURRENTLY AS OF 06/30/12 - HAS COUGH AND FINISHED ANTIBIOTIC FOR BRONCHITIS  . Fibromyalgia   . Pain     ABDOMINAL PAIN AND NAUSEA  . Pain     SOMETIMES PAIN RIGHT EAR AND NECK--STATES CAUSED BY A "LUMP" ON BACK OF EAR--USES KENALOG CREAM TOPICALLY AS NEEDED.  Marland Kitchen Gastrocutaneous fistula   . Anemia   . Anxiety   . Blood transfusion without reported diagnosis     Past Surgical History  Procedure Laterality Date  . Abdominal hysterectomy    . Esophagogastroduodenoscopy  04/18/2012    Procedure: ESOPHAGOGASTRODUODENOSCOPY (EGD);  Surgeon: Inda Castle, MD;  Location: [facility];  Service: Endoscopy;  Laterality: N/A;  . Eus  05/29/2012    Procedure: UPPER ENDOSCOPIC ULTRASOUND (EUS) LINEAR;  Surgeon: Milus Banister, MD;  Location:  [facility];  Service: Endoscopy;  Laterality: N/A;  . Appendectomy    . Spine surgery      CERVICAL SPINE SURGERY X 2 - INCLUDING FUSION; LUMBAR SURGERY FOR RUPTURED DISC  . Eye surgery      BILATERAL CATARACT EXTRACTIONS  . Laparoscopic abdominal exploration N/A 07/02/2012    Procedure: converted to laparotomy with gastric biopsy;  Surgeon: Imogene Burn. Georgette Dover, MD;  Location: [facility];  Service: General;  Laterality:  N/A;  Laparoscopic Gastric Biospy attempted.   . Laparotomy N/A 07/07/2012    Procedure: EXPLORATORY LAPAROTOMY repair of gastric perforation with omental graham patch, drainage of abdominal abcess;  Surgeon: Imogene Burn. Georgette Dover, MD;  Location: [facility];  Service: General;  Laterality: N/A;  . Stomach surgery  07/07/2012    Omental patch of gastric perforation  . Laparotomy N/A 07/13/2012    Procedure: EXPLORATORY LAPAROTOMY repair gastric leak;  Surgeon: Edward Jolly, MD;  Location: [facility];  Service: General;  Laterality: N/A;  . Laparotomy N/A 11/11/2012    Procedure: EXPLORATORY LAPAROTOMY;  Surgeon: Imogene Burn. Georgette Dover, MD;  Location: [place];  Service: General;  Laterality: N/A;  . Bowel resection N/A 11/11/2012    Procedure: SMALL BOWEL RESECTION;  Surgeon: Imogene Burn. Georgette Dover, MD;  Location: [place];  Service: General;  Laterality: N/A;  . Minor application of wound vac N/A 11/11/2012    Procedure: APPLICATION OF WOUND VAC;  Surgeon: Imogene Burn. Georgette Dover, MD;  Location: [place];  Service: General;  Laterality: N/A;  . Jejunostomy N/A 11/11/2012    Procedure: PLACEMENT OF FEEDING JEJUNOSTOMY TUBE;  Surgeon: Imogene Burn. Georgette Dover, MD;  Location: [place];  Service: General;  Laterality: N/A;  . Lysis of adhesion N/A 11/11/2012    Procedure: LYSIS OF ADHESION;  Surgeon: Imogene Burn. Georgette Dover, MD;  Location: [place];  Service: General;  Laterality: N/A;  . Hernia repair    . Gastric fistula repair      05/16/2013    History  Substance Use Topics  . Smoking status: Former Smoker -- 0.50 packs/day for 35 years    Types: Cigarettes    Start date: 01/04/2003    Quit date: 04/17/2005  . Smokeless tobacco: Never Used  . Alcohol Use: No    Family History  Problem Relation Age of Onset  . COPD Mother   . Hyperlipidemia Mother   . Hypertension Maternal Grandmother   . Stroke Maternal Grandmother     Allergies  Allergen Reactions  . Avelox [Moxifloxacin Hcl In Nacl] Nausea And Vomiting  . Betadine [Povidone Iodine]  Itching and Rash  . Alendronate Sodium Nausea And Vomiting  . Aspirin Nausea Only  . Codeine Nausea And Vomiting  . Doxycycline     Pt doesn't remember reaction  . Fluconazole     Pt doesn't remember reaction  . Hydrocodone Nausea And Vomiting    GI distress  . Latex   . Neurontin [Gabapentin] Other (See Comments)    Mood changes   . Nsaids Other (See Comments)    Severe gastritis & perforation - avoid NSAIDs when possible  . Sertraline Hcl Other (See Comments)    Hallucinations   . Sulfa Antibiotics Rash    Medication list has been reviewed and updated.  Current Outpatient Prescriptions on File Prior to Visit  Medication Sig Dispense Refill  . diphenhydrAMINE (BENADRYL) 12.5 MG/5ML liquid Take 12.5 mg by mouth 4 (four) times daily as needed for itching.       Marland Kitchen EPINEPHrine (EPIPEN) 0.3  mg/0.3 mL SOAJ Inject 0.3 mg into the muscle daily as needed (anaphylaxis).      Marland Kitchen gabapentin (NEURONTIN) 300 MG capsule Take 2 capsules (600 mg total) by mouth at bedtime.  60 capsule  5  . albuterol (PROVENTIL HFA;VENTOLIN HFA) 108 (90 BASE) MCG/ACT inhaler Inhale 2 puffs into the lungs every 6 (six) hours as needed for wheezing.      . cyclobenzaprine (FLEXERIL) 10 MG tablet Take 1 tablet (10 mg total) by mouth at bedtime.  30 tablet  0  . fluconazole (DIFLUCAN) 150 MG tablet Take 1 tablet (150 mg total) by mouth once.  1 tablet  0  . meloxicam (MOBIC) 15 MG tablet Take 15 mg by mouth daily.      . traMADol (ULTRAM) 50 MG tablet Take 1 tablet (50 mg total) by mouth every 8 (eight) hours as needed.  30 tablet  0  . traMADol (ULTRAM) 50 MG tablet Take 1-2 tablets (50-100 mg total) by mouth every 6 (six) hours as needed.  60 tablet  0  . triamcinolone cream (KENALOG) 0.1 % Apply 1 application topically 2 (two) times daily.  30 g  0   No current facility-administered medications on file prior to visit.    Review of Systems:  As per HPI, otherwise negative.    Physical Examination: Filed  Vitals:   10/05/13 1343  BP: 100/70  Pulse: 103  Temp: 97.6 F (36.4 C)  Resp: 17   Filed Vitals:   10/05/13 1343  Height: 5' 5.5" (1.664 m)  Weight: 92 lb (41.731 kg)   Body mass index is 15.07 kg/(m^2). Ideal Body Weight: Weight in (lb) to have BMI = 25: 152.2  GEN: emaciated, moderate distress, Non-toxic, A & O x 3 HEENT: Atraumatic, Normocephalic. Neck supple. No masses, No LAD. Ears and Nose: No external deformity. CV: RRR, No M/G/R. No JVD. No thrill. No extra heart sounds. PULM: CTA B, no wheezes, crackles, rhonchi. No retractions. No resp. distress. No accessory muscle use. ABD: flat, extensive surgical scars generalized abdominal tenderness, ND, +BS. No rebound. No HSM. EXTR: No c/c/e NEURO Normal gait.  PSYCH: Normally interactive. Conversant. Not depressed or anxious appearing.  Calm demeanor.    Assessment and Plan: Dehydration Bowel obstruction vs ileus Called Dr Judeth Cornfield at [facility],  Ellison Carwin, MD   [facility] reading (PRIMARY) by  Dr. Ouida Sills.  Ileus vs obstruction.  No free air.  Results for orders placed in visit on 10/05/13  POCT CBC      Result Value Ref Range   WBC 11.4 (*) 4.6 - 10.2 K/uL   Lymph, poc 2.4  0.6 - 3.4   POC LYMPH PERCENT 21.4  10 - 50 %L   MID (cbc) 0.6  0 - 0.9   POC MID % 5.3  0 - 12 %M   POC Granulocyte 8.4 (*) 2 - 6.9   Granulocyte percent 73.3  37 - 80 %G   RBC 4.41  4.04 - 5.48 M/uL   Hemoglobin 13.0  12.2 - 16.2 g/dL   HCT, POC 40.0  37.7 - 47.9 %   MCV 90.8  80 - 97 fL   MCH, POC 29.5  27 - 31.2 pg   MCHC 32.5  31.8 - 35.4 g/dL   RDW, POC 15.9     Platelet Count, POC 408  142 - 424 K/uL   MPV 9.0  0 - 99.8 fL

## 2013-10-07 ENCOUNTER — Ambulatory Visit (INDEPENDENT_AMBULATORY_CARE_PROVIDER_SITE_OTHER): Payer: Medicare Other | Admitting: Internal Medicine

## 2013-10-07 ENCOUNTER — Encounter: Payer: Self-pay | Admitting: Internal Medicine

## 2013-10-07 VITALS — BP 100/60 | HR 63 | Temp 98.4°F | Resp 16 | Ht 65.0 in | Wt 96.0 lb

## 2013-10-07 DIAGNOSIS — F32A Depression, unspecified: Secondary | ICD-10-CM

## 2013-10-07 DIAGNOSIS — I319 Disease of pericardium, unspecified: Secondary | ICD-10-CM

## 2013-10-07 DIAGNOSIS — K219 Gastro-esophageal reflux disease without esophagitis: Secondary | ICD-10-CM

## 2013-10-07 DIAGNOSIS — N83209 Unspecified ovarian cyst, unspecified side: Secondary | ICD-10-CM

## 2013-10-07 DIAGNOSIS — F329 Major depressive disorder, single episode, unspecified: Secondary | ICD-10-CM

## 2013-10-07 DIAGNOSIS — K862 Cyst of pancreas: Secondary | ICD-10-CM

## 2013-10-07 DIAGNOSIS — IMO0001 Reserved for inherently not codable concepts without codable children: Secondary | ICD-10-CM

## 2013-10-07 DIAGNOSIS — M5090 Cervical disc disorder, unspecified, unspecified cervical region: Secondary | ICD-10-CM

## 2013-10-07 DIAGNOSIS — E46 Unspecified protein-calorie malnutrition: Secondary | ICD-10-CM

## 2013-10-07 DIAGNOSIS — I10 Essential (primary) hypertension: Secondary | ICD-10-CM

## 2013-10-07 DIAGNOSIS — I3139 Other pericardial effusion (noninflammatory): Secondary | ICD-10-CM

## 2013-10-07 DIAGNOSIS — I2699 Other pulmonary embolism without acute cor pulmonale: Secondary | ICD-10-CM

## 2013-10-07 DIAGNOSIS — I313 Pericardial effusion (noninflammatory): Secondary | ICD-10-CM

## 2013-10-07 DIAGNOSIS — J449 Chronic obstructive pulmonary disease, unspecified: Secondary | ICD-10-CM

## 2013-10-07 DIAGNOSIS — J4489 Other specified chronic obstructive pulmonary disease: Secondary | ICD-10-CM

## 2013-10-07 DIAGNOSIS — Z0000 Encounter for general adult medical examination without abnormal findings: Secondary | ICD-10-CM

## 2013-10-07 DIAGNOSIS — F3289 Other specified depressive episodes: Secondary | ICD-10-CM

## 2013-10-07 DIAGNOSIS — M797 Fibromyalgia: Secondary | ICD-10-CM

## 2013-10-07 DIAGNOSIS — K863 Pseudocyst of pancreas: Secondary | ICD-10-CM

## 2013-10-07 DIAGNOSIS — N200 Calculus of kidney: Secondary | ICD-10-CM

## 2013-10-07 DIAGNOSIS — N2889 Other specified disorders of kidney and ureter: Secondary | ICD-10-CM

## 2013-10-07 DIAGNOSIS — I70209 Unspecified atherosclerosis of native arteries of extremities, unspecified extremity: Secondary | ICD-10-CM

## 2013-10-07 DIAGNOSIS — N289 Disorder of kidney and ureter, unspecified: Secondary | ICD-10-CM

## 2013-10-07 DIAGNOSIS — R109 Unspecified abdominal pain: Secondary | ICD-10-CM

## 2013-10-07 LAB — POCT URINALYSIS DIPSTICK
BILIRUBIN UA: NEGATIVE
GLUCOSE UA: NEGATIVE
Ketones, UA: NEGATIVE
Nitrite, UA: NEGATIVE
Protein, UA: NEGATIVE
SPEC GRAV UA: 1.02
UROBILINOGEN UA: 0.2
pH, UA: 5

## 2013-10-07 MED ORDER — EPINEPHRINE 0.3 MG/0.3ML IJ SOAJ: 0.3000 mg | Freq: Every day | INTRAMUSCULAR | Status: DC | PRN

## 2013-10-07 NOTE — Progress Notes (Addendum)
This chart was scribed for [name] Koyanagi, MD by Einar Pheasant, ED Scribe. This patient was seen in room 27 and the patient's care was started at 3:45 PM.  Subjective:    Patient ID: Cynthia Bowen, female    DOB: Feb 01, 1946, 68 y.o.   MRN: [id number]   HPI HPI Comments: Cynthia Bowen is a 68 y.o. female who presents to [facility] as scheduled for her annual exam. With multiple recent interventions she prefers to address current problems instead.  Pt states that 6/15 she was seen here in the office by Dr. Ouida Sills. That day she was complaining of nausea, emesis, and abdominal pain for 2 days, in the setting of her prolonged chronic problems.  Pt was diagnosed with a bowel obstruction and referred to her GI MDs at [facility]. However when she went to get her CT test done nothing abnormal was found. She was also diagnosed with dehydration. Pt states that she began to feel better after she received IV fluids and is now back to baseline. (see note from [facility])   Her current issues include: Protein calorie malnutrition--- Wt Readings from Last 3 Encounters:  10/07/13 96 lb (43.545 kg)  10/05/13 92 lb (41.731 kg)  08/23/13 97 lb 6.4 oz (44.18 kg)  stable  PE (pulmonary embolism)-now off anticoags and stable  HTN (hypertension)--now resolved and off meds  GERD (gastroesophageal reflux disease)--stable  Depression/anxiety--stable and off meds but mood labile due to recent stormy medical course  Fibromyalgia/Cervical neck pain with evidence of disc disease--  Pt states that the Neurontin is working well. She does not want to increase the dosage.   Pt no longer takes Tramadol, flexeril,  Mobic  COPD GOLD I  Dr Melvyn Novas dx-he lollows-she is asymptomatic  Abdominal pain  Chronic recurrent/ always a difficult complaint to interpret given her status post multiple surgeries with complications over  the past 18 months    Allergies  Allergen Reactions  . Avelox  [Moxifloxacin Hcl In Nacl] Nausea And Vomiting  . Betadine [Povidone Iodine] Itching and Rash  . Alendronate Sodium Nausea And Vomiting  . Aspirin Nausea Only  . Codeine Nausea And Vomiting  . Doxycycline     Pt doesn't remember reaction  . Fluconazole     Pt doesn't remember reaction  . Hydrocodone Nausea And Vomiting    GI distress  . Latex   . Neurontin [Gabapentin] Other (See Comments)    Mood changes   . Nsaids Other (See Comments)    Severe gastritis & perforation - avoid NSAIDs when possible  . Sertraline Hcl Other (See Comments)    Hallucinations   . Sulfa Antibiotics Rash   Prior to Admission medications   Medication Sig Start Date End Date Taking? Authorizing Cynthia Bowen  albuterol (PROVENTIL HFA;VENTOLIN HFA) 108 (90 BASE) MCG/ACT inhaler Inhale 2 puffs into the lungs every 6 (six) hours as needed for wheezing.   Yes Historical Cynthia Juhasz, MD  diphenhydrAMINE (BENADRYL) 12.5 MG/5ML liquid Take 12.5 mg by mouth 4 (four) times daily as needed for itching.    Yes Historical Cynthia Abernathy, MD  EPINEPHrine (EPIPEN) 0.3 mg/0.3 mL SOAJ Inject 0.3 mg into the muscle daily as needed (anaphylaxis).   Yes Historical Cynthia Dubray, MD  gabapentin (NEURONTIN) 300 MG capsule Take 2 capsules (600 mg total) by mouth at bedtime. 08/23/13  Yes [name] Koyanagi, MD      Review of Systems  Constitutional: Negative for fever, chills, appetite change and unexpected weight change.  She has been successful at maintaining dietary intake over the last few months and has been more stable than any point since her surgery with multiple complications  HENT: Negative for trouble swallowing.   Eyes: Negative for visual disturbance.  Respiratory: Negative for chest tightness and shortness of breath.        She is not very active and this is perhaps why she has no shortness of breath despite the presence of a pericardial effusion///  Cardiovascular: Negative for chest pain, palpitations and leg swelling.    Gastrointestinal: Negative for nausea and abdominal pain.  Genitourinary: Negative for dysuria, frequency and difficulty urinating.       With history of persistent nephrolithiasis we will check UA today although she remains asymptomatic  Musculoskeletal: Negative for arthralgias, joint swelling and neck pain.  Skin: Negative.  Negative for rash and wound.  Neurological: Negative for dizziness, light-headedness, numbness and headaches.          Objective:   Physical Exam  Nursing note and vitals reviewed. Constitutional: She is oriented to person, place, and time. No distress.  Very thin but no longer appears so emaciated  HENT:  Head: Normocephalic and atraumatic.  Eyes: Conjunctivae and EOM are normal. Pupils are equal, round, and reactive to light.  Neck: No thyromegaly present.  Cardiovascular: Normal rate, regular rhythm and normal heart sounds.  Exam reveals no gallop and no friction rub.   No murmur heard. Peripheral pulses are present in the lower extremities though somewhat diminished  Pulmonary/Chest: Effort normal and breath sounds normal. No respiratory distress.  Abdominal: Soft. She exhibits no distension. There is no tenderness.  Musculoskeletal: She exhibits no edema.  Lymphadenopathy:    She has no cervical adenopathy.  Neurological: She is alert and oriented to person, place, and time. No cranial nerve deficit.  Psychiatric: She has a normal mood and affect. Her behavior is normal. Thought content normal.      Filed Vitals:   10/07/13 1444  BP: 100/60  Pulse: 63  Temp: 98.4 F (36.9 C)  TempSrc: Oral  Resp: 16  Height: 5\' 5"  (1.651 m)  Weight: 96 lb (43.545 kg)  SpO2: 94%   Results for orders placed in visit on 10/07/13  POCT URINALYSIS DIPSTICK      Result Value Ref Range   Color, UA yellow     Clarity, UA clear     Glucose, UA neg     Bilirubin, UA neg     Ketones, UA neg     Spec Grav, UA 1.020     Blood, UA trace     pH, UA 5.0      Protein, UA neg     Urobilinogen, UA 0.2     Nitrite, UA neg     Leukocytes, UA small (1+)      Assessment & Plan:  Recheck of chronic problems:  Protein calorie malnutrition  PE (pulmonary embolism)  GERD (gastroesophageal reflux disease)  Depression/Anxiety  Fibromyalgia  COPD GOLD I  Cervical neck pain with evidence of disc disease  Abdominal pain-recurrent  Will set up f/u exam for health maintenance/welcome to medicare/Prenvar/zostavax/opth/BMD/lipids/glucose or A1C/US L kidney to reck [facility] finding(1.1 cm mass ) and consideration of her other findings: 1. 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 No evidence of bowel obstruction.  2. Similar appearance of a 6 mm low-attenuation lesion in the pancreas. Followup MRI is again recommended.  3. Similar appearance of a 3 cm right ovarian cyst. Further evaluation with  ultrasound is again recommended.(This was noted 02/22/2013 with CT [facility] and not commented on subsequent CTs in December February and March)  4. Nonobstructive right renal calculi. (Asymptomatic and present on multiple exams) 5. Incompletely characterized 1.1 cm left renal lesion. Further evaluation with ultrasound is recommended.(This latest CT scans the first mention of this finding and so she needs ultrasound now)  6. Extensive atherosclerosis with high-grade stenosis of the left distal common femoral/proximal superficial femoral artery and occlusion of the right proximal superficial femoral artery. (She needs arterial Dopplers to characterize the degree of dysfunction) 7. Ill-defined areas of low-attenuation within the anterior aspect of the left lobe of the liver are similar to prior and may be perfusional defects related to the patient's prior procedures. Recommend continued attention on followup.  8. Similar appearance of a moderate-sized pericardial effusion(this was first noted 04/17/2012 during an admission to [facility], cardiology consult  notes are incomplete in the chart but this was a small effusion at this time and thought to be hemodynamically insignificant--in view of her resolution of hypertension and now relatively low blood pressure she needs echocardiogram)   This OV required 50 minutes to complete  I personally performed the services described in this documentation, which was scribed in my presence. The recorded information has been reviewed and is accurate.

## 2013-10-08 ENCOUNTER — Other Ambulatory Visit: Payer: Self-pay | Admitting: *Deleted

## 2013-10-08 DIAGNOSIS — K862 Cyst of pancreas: Secondary | ICD-10-CM | POA: Insufficient documentation

## 2013-10-08 DIAGNOSIS — N83209 Unspecified ovarian cyst, unspecified side: Secondary | ICD-10-CM | POA: Insufficient documentation

## 2013-10-08 DIAGNOSIS — I70209 Unspecified atherosclerosis of native arteries of extremities, unspecified extremity: Secondary | ICD-10-CM | POA: Insufficient documentation

## 2013-10-08 DIAGNOSIS — N200 Calculus of kidney: Secondary | ICD-10-CM | POA: Insufficient documentation

## 2013-10-08 NOTE — Telephone Encounter (Signed)
Message copied by Belva Chimes on Thu Oct 08, 2013  3:52 PM ------      Message from: [name] Koyanagi      Created: Thu Oct 08, 2013 10:22 AM       Please call-I will schedule a follow up exam for August/there are 3 chest need for her to have before she returns a stone other things noted over the past 6 months at [facility]/w and showed continued pericardial effusion and she needs an echocardiogram///Latest ct scan showed a left kidney cyst that needs an ultrasound to characterize///they also have noted arteriosclerosis with stenosis in her femoral arteries and we need an arterial Doppler study--- if she agrees to these let me know to order them now  prior to her if her followup in August ------

## 2013-10-08 NOTE — Telephone Encounter (Signed)
Please clarify the message below. I would be more than happy to call pt.

## 2013-10-09 NOTE — Telephone Encounter (Signed)
Lm for rtn call 

## 2013-10-09 NOTE — Telephone Encounter (Signed)
3 "tests" before F/U-to check things noted on scans at [facility]////echocard-renal US-dopp---all pended

## 2013-10-12 NOTE — Telephone Encounter (Signed)
Lm for rtn call 

## 2013-10-13 NOTE — Telephone Encounter (Signed)
Lm for rtn call 

## 2013-10-14 NOTE — Telephone Encounter (Signed)
Pt states she agrees with plan to have the studies completed.  She also states she is still having issues with nausea and would like a refill on her phenergan. I have pended this medication.

## 2013-10-14 NOTE — Progress Notes (Signed)
Left a message for patient to return call to schedule cpe with Dr. Laney Pastor in August.

## 2013-10-15 MED ORDER — PROMETHAZINE HCL 12.5 MG PO TABS: 12.5000 mg | ORAL_TABLET | Freq: Four times a day (QID) | ORAL | Status: DC | PRN

## 2013-10-17 ENCOUNTER — Ambulatory Visit (INDEPENDENT_AMBULATORY_CARE_PROVIDER_SITE_OTHER): Payer: Medicare Other | Admitting: Internal Medicine

## 2013-10-17 ENCOUNTER — Ambulatory Visit (INDEPENDENT_AMBULATORY_CARE_PROVIDER_SITE_OTHER): Payer: Medicare Other

## 2013-10-17 VITALS — BP 105/50 | HR 85 | Temp 98.4°F | Resp 16 | Ht 64.0 in | Wt 96.6 lb

## 2013-10-17 DIAGNOSIS — I319 Disease of pericardium, unspecified: Secondary | ICD-10-CM

## 2013-10-17 DIAGNOSIS — I70209 Unspecified atherosclerosis of native arteries of extremities, unspecified extremity: Secondary | ICD-10-CM

## 2013-10-17 DIAGNOSIS — N2889 Other specified disorders of kidney and ureter: Secondary | ICD-10-CM

## 2013-10-17 DIAGNOSIS — R319 Hematuria, unspecified: Secondary | ICD-10-CM

## 2013-10-17 DIAGNOSIS — I313 Pericardial effusion (noninflammatory): Secondary | ICD-10-CM

## 2013-10-17 DIAGNOSIS — R1084 Generalized abdominal pain: Secondary | ICD-10-CM

## 2013-10-17 DIAGNOSIS — N289 Disorder of kidney and ureter, unspecified: Secondary | ICD-10-CM

## 2013-10-17 DIAGNOSIS — I3139 Other pericardial effusion (noninflammatory): Secondary | ICD-10-CM

## 2013-10-17 LAB — POCT URINALYSIS DIPSTICK
Bilirubin, UA: NEGATIVE
Glucose, UA: NEGATIVE
KETONES UA: NEGATIVE
LEUKOCYTES UA: NEGATIVE
NITRITE UA: NEGATIVE
Spec Grav, UA: 1.015
Urobilinogen, UA: 0.2
pH, UA: 6

## 2013-10-17 LAB — COMPREHENSIVE METABOLIC PANEL
ALT: 9 U/L (ref 0–35)
AST: 17 U/L (ref 0–37)
Albumin: 3.6 g/dL (ref 3.5–5.2)
Alkaline Phosphatase: 87 U/L (ref 39–117)
BILIRUBIN TOTAL: 0.4 mg/dL (ref 0.2–1.2)
BUN: 7 mg/dL (ref 6–23)
CO2: 28 meq/L (ref 19–32)
Calcium: 9.1 mg/dL (ref 8.4–10.5)
Chloride: 105 mEq/L (ref 96–112)
Creat: 0.71 mg/dL (ref 0.50–1.10)
GLUCOSE: 50 mg/dL — AB (ref 70–99)
Potassium: 3.3 mEq/L — ABNORMAL LOW (ref 3.5–5.3)
Sodium: 142 mEq/L (ref 135–145)
TOTAL PROTEIN: 6.8 g/dL (ref 6.0–8.3)

## 2013-10-17 LAB — CBC WITH DIFFERENTIAL/PLATELET
Basophils Absolute: 0 10*3/uL (ref 0.0–0.1)
Basophils Relative: 0 % (ref 0–1)
Eosinophils Absolute: 0 10*3/uL (ref 0.0–0.7)
Eosinophils Relative: 0 % (ref 0–5)
HEMATOCRIT: 39.3 % (ref 36.0–46.0)
HEMOGLOBIN: 13.7 g/dL (ref 12.0–15.0)
LYMPHS ABS: 1.7 10*3/uL (ref 0.7–4.0)
LYMPHS PCT: 17 % (ref 12–46)
MCH: 30.1 pg (ref 26.0–34.0)
MCHC: 34.9 g/dL (ref 30.0–36.0)
MCV: 86.4 fL (ref 78.0–100.0)
MONO ABS: 0.5 10*3/uL (ref 0.1–1.0)
Monocytes Relative: 5 % (ref 3–12)
Neutro Abs: 7.9 10*3/uL — ABNORMAL HIGH (ref 1.7–7.7)
Neutrophils Relative %: 78 % — ABNORMAL HIGH (ref 43–77)
Platelets: 390 10*3/uL (ref 150–400)
RBC: 4.55 MIL/uL (ref 3.87–5.11)
RDW: 16.3 % — ABNORMAL HIGH (ref 11.5–15.5)
WBC: 10.1 10*3/uL (ref 4.0–10.5)

## 2013-10-17 LAB — POCT UA - MICROSCOPIC ONLY
Casts, Ur, LPF, POC: NEGATIVE
Crystals, Ur, HPF, POC: NEGATIVE
MUCUS UA: POSITIVE
YEAST UA: NEGATIVE

## 2013-10-17 MED ORDER — POLYETHYLENE GLYCOL 3350 17 GM/SCOOP PO POWD: 17.0000 g | Freq: Every day | ORAL | Status: DC

## 2013-10-17 MED ORDER — OXYCODONE-ACETAMINOPHEN 5-325 MG PO TABS: 1.0000 | ORAL_TABLET | Freq: Three times a day (TID) | ORAL | Status: DC | PRN

## 2013-10-17 MED ORDER — CILIDINIUM-CHLORDIAZEPOXIDE 2.5-5 MG PO CAPS
1.0000 | ORAL_CAPSULE | Freq: Three times a day (TID) | ORAL | Status: DC
Start: 2013-10-17 — End: 2013-12-09

## 2013-10-17 NOTE — Progress Notes (Signed)
Subjective:    Patient ID: Cynthia Bowen, female    DOB: 1946/01/16, 68 y.o.   MRN: [id number]  HPIc/o abd pain constant 2 weeks w/ N/V--able to hold down liquids but rarely solids. No fever. No diarrhea or constipation. Pain severe enough to interfere with sleep. Has cramping, burning, and constant ache. Feels bloated. Pain is now both upper and lower abdomen in contrast with the past. Denies dysuria frequency or urgency. Denies chest pain but has an occasional cough is nonproductive. Appetite is affected by pain. See  office visit with referral back to [facility] where CT scan was repeated. They assume that their postsurgical complications have been resolved and that there is no further reason for her to be seen by them.  There are other unresolved problems 1. she has known nephrolithiasis on the right with a new ill-defined lesion in the left renal area discovered on her most recent CT scan but was not seen seen on prior scans--this needs ultrasound to characterize further 2. she has a persistent pericardial effusion noted on these abdominal scans and has not had further workup. She currently has no chest pain or shortness of breath and no paroxysmal nocturnal dyspnea, however she was previously hypertensive and is now often hypotensive on no medication. She needs echocardiogram. 3. there is a 3 cm right ovarian cyst which was noted on 11-14 with a CT at [facility], not commented on on subsequent scans in December February and March, but seen once again on her most recent scan at [facility] a few weeks ago. 4. there is a 6 mm low-attenuation lesion in the pancreas which has been seen on several scans and MRI has been recommended for further evaluation.  5. there is significant high grade stenosis of the left distal common femoral and proximal superficial femoral arteries with occlusion of the right proximal superficial femoral artery needing arterial Dopplers to characterize the degree of dysfunction    Review of Systems  Constitutional: Positive for fatigue. Negative for fever and diaphoresis.  HENT: Negative for mouth sores and trouble swallowing.   Eyes: Negative for visual disturbance.  Respiratory: Positive for cough. Negative for choking, shortness of breath and wheezing.   Cardiovascular: Negative for chest pain, palpitations and leg swelling.  Gastrointestinal: Negative for blood in stool.  Genitourinary: Negative for dysuria, urgency, frequency, hematuria and difficulty urinating.  Musculoskeletal: Negative for back pain.       Long history of arthritis particularly of the upper extremities but no recent complaints. This improved after she discontinued working.  Neurological: Negative for speech difficulty and headaches.  Psychiatric/Behavioral:       Continues to have anxiety particularly when her pain can seem out-of-control. She also has appropriate depression at her terrible medical problems since her surgery. There was a history of depression that was significant during her working years as well.       Objective:   Physical Exam Wt Readings from Last 3 Encounters:  10/17/13 96 lb 9.6 oz (43.817 kg)  10/07/13 96 lb (43.545 kg)  10/05/13 92 lb (41.731 kg)  BP 105/50  Pulse 85  Temp(Src) 98.4 F (36.9 C) (Oral)  Resp 16  Ht 5\' 4"  (1.626 m)  Wt 96 lb 9.6 oz (43.817 kg)  BMI 16.57 kg/m2  SpO2 97% She is alert and in no acute distress HEENT is clear Heart is regular without murmur click Lungs are clear the abdomen is soft but with tenderness to palpation in all 4 quadrants. There  is no rebound tenderness. She does guard. Bowel sounds are very active. There is no apparent hepatosplenomegaly and no masses in other areas.   the midline scar is healed for the most part. There is one small area with a slightly bloody effusion but no obvious cellulitis or abscess.   extremities are clear without edema /peripheral pulses are diminished   no sensory losses in the lower  extremities Gait is normal Romberg is negative Well oriented to time person and place and situation   [facility] reading (PRIMARY) by  DrDoolittle=similar to 6/15 with colon gas but no obstruction--lots of stool//atelectasis left lung base/hyperinflation  Results for orders placed in visit on 10/17/13  POCT URINALYSIS DIPSTICK      Result Value Ref Range   Color, UA yellow     Clarity, UA clear     Glucose, UA neg     Bilirubin, UA neg     Ketones, UA neg     Spec Grav, UA 1.015     Blood, UA moderate     pH, UA 6.0     Protein, UA trace     Urobilinogen, UA 0.2     Nitrite, UA neg     Leukocytes, UA Negative    POCT UA - MICROSCOPIC ONLY      Result Value Ref Range   WBC, Ur, HPF, POC 1-3     RBC, urine, microscopic 10-18     Bacteria, U Microscopic 1+     Mucus, UA positive     Epithelial cells, urine per micros 0-1     Crystals, Ur, HPF, POC neg     Casts, Ur, LPF, POC neg     Yeast, UA neg      55 minute office visit     Assessment & Plan:  Generalized abdominal pain -The etiology should be poor gastrointestinal function made worse by occasional narcotics and so I recommend trying MiraLax plus Librax/okay to use narcotics to sleep only  Hematuria-? New renal mass on the left---will set up [place]  Pericardial effusion--echocardiogram  ? Pancreatic mass-due to the size, further imaging with MRI can wait   lower extremity arterial occlusion--will set up Dopplers  Ovarian mass --imaging after above completed  Meds ordered this encounter  Medications  . clidinium-chlordiazePOXIDE (LIBRAX) 5-2.5 MG per capsule    Sig: Take 1 capsule by mouth 4 (four) times daily -  before meals and at bedtime.    Dispense:  60 capsule    Refill:  3  . polyethylene glycol powder (GLYCOLAX/MIRALAX) powder    Sig: Take 17 g by mouth daily.    Dispense:  255 g    Refill:  1  . oxyCODONE-acetaminophen (ROXICET) 5-325 MG per tablet    Sig: Take 1 tablet by mouth every 8 (eight) hours as  needed for severe pain. Use mainly at bedtime for sleep purposes     Dispense:  20 tablet    Refill:  0

## 2013-10-19 ENCOUNTER — Other Ambulatory Visit (HOSPITAL_COMMUNITY): Payer: Self-pay | Admitting: Cardiology

## 2013-10-19 DIAGNOSIS — I739 Peripheral vascular disease, unspecified: Secondary | ICD-10-CM

## 2013-10-21 ENCOUNTER — Ambulatory Visit (HOSPITAL_BASED_OUTPATIENT_CLINIC_OR_DEPARTMENT_OTHER): Payer: Medicare Other | Admitting: Cardiology

## 2013-10-21 ENCOUNTER — Ambulatory Visit (HOSPITAL_COMMUNITY): Payer: Medicare Other | Attending: Cardiology | Admitting: Cardiology

## 2013-10-21 DIAGNOSIS — J4489 Other specified chronic obstructive pulmonary disease: Secondary | ICD-10-CM | POA: Insufficient documentation

## 2013-10-21 DIAGNOSIS — J449 Chronic obstructive pulmonary disease, unspecified: Secondary | ICD-10-CM | POA: Insufficient documentation

## 2013-10-21 DIAGNOSIS — Z87891 Personal history of nicotine dependence: Secondary | ICD-10-CM | POA: Insufficient documentation

## 2013-10-21 DIAGNOSIS — I10 Essential (primary) hypertension: Secondary | ICD-10-CM | POA: Insufficient documentation

## 2013-10-21 DIAGNOSIS — I313 Pericardial effusion (noninflammatory): Secondary | ICD-10-CM

## 2013-10-21 DIAGNOSIS — I739 Peripheral vascular disease, unspecified: Secondary | ICD-10-CM | POA: Diagnosis present

## 2013-10-21 DIAGNOSIS — I3139 Other pericardial effusion (noninflammatory): Secondary | ICD-10-CM

## 2013-10-21 DIAGNOSIS — I319 Disease of pericardium, unspecified: Secondary | ICD-10-CM

## 2013-10-21 NOTE — Progress Notes (Signed)
Echo performed. 

## 2013-10-21 NOTE — Progress Notes (Signed)
Lower arterial doppler and duplex bilateral performed 

## 2013-10-21 NOTE — Progress Notes (Signed)
Scheduled for 7/8/ @ 12:45 pm

## 2013-10-21 NOTE — Progress Notes (Signed)
Scheduled for 8/19 @ 12:15 for CPE

## 2013-10-22 ENCOUNTER — Ambulatory Visit
Admission: RE | Admit: 2013-10-22 | Discharge: 2013-10-22 | Disposition: A | Discharge status: Home / Self Care | Payer: Medicare Other | Source: Ambulatory Visit | Attending: Internal Medicine | Admitting: Internal Medicine

## 2013-10-22 DIAGNOSIS — N2889 Other specified disorders of kidney and ureter: Secondary | ICD-10-CM

## 2013-10-28 ENCOUNTER — Encounter: Payer: Self-pay | Admitting: Internal Medicine

## 2013-10-28 ENCOUNTER — Ambulatory Visit (INDEPENDENT_AMBULATORY_CARE_PROVIDER_SITE_OTHER): Payer: Medicare Other | Admitting: Internal Medicine

## 2013-10-28 VITALS — BP 91/60 | HR 104 | Temp 98.9°F | Resp 16 | Ht 64.5 in | Wt 93.0 lb

## 2013-10-28 DIAGNOSIS — I319 Disease of pericardium, unspecified: Secondary | ICD-10-CM

## 2013-10-28 DIAGNOSIS — N289 Disorder of kidney and ureter, unspecified: Secondary | ICD-10-CM

## 2013-10-28 DIAGNOSIS — N2889 Other specified disorders of kidney and ureter: Secondary | ICD-10-CM

## 2013-10-28 DIAGNOSIS — I70209 Unspecified atherosclerosis of native arteries of extremities, unspecified extremity: Secondary | ICD-10-CM

## 2013-10-28 DIAGNOSIS — R21 Rash and other nonspecific skin eruption: Secondary | ICD-10-CM

## 2013-10-28 DIAGNOSIS — I313 Pericardial effusion (noninflammatory): Secondary | ICD-10-CM

## 2013-10-28 DIAGNOSIS — I3139 Other pericardial effusion (noninflammatory): Secondary | ICD-10-CM

## 2013-10-28 DIAGNOSIS — R1084 Generalized abdominal pain: Secondary | ICD-10-CM

## 2013-10-28 LAB — POCT SKIN KOH: SKIN KOH, POC: NEGATIVE

## 2013-10-28 MED ORDER — FLUOCINONIDE 0.05 % EX CREA: 1.0000 "application " | TOPICAL_CREAM | Freq: Two times a day (BID) | CUTANEOUS | Status: DC

## 2013-10-28 MED ORDER — KETOCONAZOLE 2 % EX SHAM: 1.0000 "application " | MEDICATED_SHAMPOO | CUTANEOUS | Status: DC

## 2013-10-28 NOTE — Progress Notes (Signed)
   Subjective:    Patient ID: Cynthia Bowen, female    DOB: 1945-12-08, 68 y.o.   MRN: [id number]  HPIf/u Rash and nonspecific skin eruption -  This just started in the last 48 hours without any known new exposures and no change in medication.   This is a pruritic rash that involves the scalp and neck and is scaly.   Generalized abdominal pain  She responded to our bowel purge with Librax now being continued along with MiraLax and has been feeling good and  able to eat for the last few weeks   Renal mass, left  Renal ultrasound unfortunately did not identify more than some type of cystic lesion with in the left upper pole that  needs further clarification with renal CT. She does have bilateral kidney stones   Pericardial effusion  Echo characterized this as present but mild and probably unimportant /ejection fraction was acceptable .  Femoral artery stenosis  She has mild/moderate problems in both lower extremities documented by arterial Doppler  She remains asymptomatic at the current time     Review of Systems She is doing well since her last visit  Appetite is improving  No chest pain or shortness of breath  No urinary difficulties  No peripheral edema  Sleeping better     Objective:   Physical Exam  Constitutional: No distress.  Eyes: Conjunctivae and EOM are normal. Pupils are equal, round, and reactive to light.  Neck: No thyromegaly present.  Cardiovascular: Normal rate and regular rhythm.   No murmur heard. Pulmonary/Chest: Effort normal and breath sounds normal.  Musculoskeletal: She exhibits no edema.  Lymphadenopathy:    She has no cervical adenopathy.  Skin:  Scaly shin colored changes scalp neck and ears  KOH is negative  Wt Readings from Last 3 Encounters:  10/28/13 93 lb (42.185 kg)  10/17/13 96 lb 9.6 oz (43.817 kg)  10/07/13 96 lb (43.545 kg)         Assessment & Plan:  Rash and nonspecific skin eruption - seborrheic dermatitis   Meds  ordered this encounter  Medications  . fluocinonide cream (LIDEX) 0.05 %    Sig: Apply 1 application topically 2 (two) times daily.    Dispense:  30 g    Refill:  0  . ketoconazole (NIZORAL) 2 % shampoo    Sig: Apply 1 application topically 2 (two) times a week.    Dispense:  120 mL    Refill:  0     Generalized abdominal pain-status post multiple surgeries with complications, now stable  Can now attend to calorie increase and weight improvement  Renal mass, left---Ct renal protocol needed to r/o malignancy-- 'indeterminate complex cystic  lesion upper pole left kidney '   Pericardial effusion--follow for any sxtoms  Femoral artery stenosis--Begin ASA daily325-watch for claudication, extremity emboli etc  6wk f/u--refills prn til then

## 2013-11-10 ENCOUNTER — Other Ambulatory Visit: Payer: Self-pay | Admitting: *Deleted

## 2013-11-10 DIAGNOSIS — R1084 Generalized abdominal pain: Secondary | ICD-10-CM

## 2013-11-13 ENCOUNTER — Ambulatory Visit
Admission: RE | Admit: 2013-11-13 | Discharge: 2013-11-13 | Disposition: A | Discharge status: Home / Self Care | Payer: Medicare Other | Source: Ambulatory Visit | Attending: Internal Medicine | Admitting: Internal Medicine

## 2013-11-13 ENCOUNTER — Encounter: Payer: Self-pay | Admitting: Internal Medicine

## 2013-11-13 DIAGNOSIS — R1084 Generalized abdominal pain: Secondary | ICD-10-CM

## 2013-11-13 MED ORDER — IOHEXOL 300 MG/ML  SOLN
93.0000 mL | Freq: Once | INTRAMUSCULAR | Status: AC | PRN
  Administered 2013-11-13: 93 mL via INTRAVENOUS

## 2013-11-17 ENCOUNTER — Ambulatory Visit (INDEPENDENT_AMBULATORY_CARE_PROVIDER_SITE_OTHER): Payer: Medicare Other | Admitting: Internal Medicine

## 2013-11-17 VITALS — BP 100/64 | HR 82 | Temp 98.2°F | Resp 20 | Ht 63.62 in | Wt 91.1 lb

## 2013-11-17 DIAGNOSIS — R634 Abnormal weight loss: Secondary | ICD-10-CM

## 2013-11-17 DIAGNOSIS — I70209 Unspecified atherosclerosis of native arteries of extremities, unspecified extremity: Secondary | ICD-10-CM

## 2013-11-17 DIAGNOSIS — R0602 Shortness of breath: Secondary | ICD-10-CM

## 2013-11-17 DIAGNOSIS — R42 Dizziness and giddiness: Secondary | ICD-10-CM

## 2013-11-17 MED ORDER — ALBUTEROL SULFATE HFA 108 (90 BASE) MCG/ACT IN AERS: 2.0000 | INHALATION_SPRAY | Freq: Four times a day (QID) | RESPIRATORY_TRACT | Status: DC | PRN

## 2013-11-17 NOTE — Patient Instructions (Addendum)
Decrease to 1 at bedtime --if no increase in pain  In one week may stop this med. Stop librax to see if dizziness resolves

## 2013-11-17 NOTE — Progress Notes (Signed)
Subjective:  This chart was scribed for Cynthia Lin, MD by Cynthia Bowen, Medical Scribe. This patient was seen in Room 14 and the patient's care was started at 6:21 PM.   Patient ID: Cynthia Bowen, female    DOB: 15-May-1945, 68 y.o.   MRN: [id number]  HPI HPI Comments: Cynthia Bowen is a 68 y.o. female who presents to the [facility] complaining of constant SOB with activity that started a week ago when she started taking new medication.  She states that she used to be able to walk 2-3 miles a day but cannot do that anymore due to her symptoms.  She will experience some leg pain, with worsening pain on the left, when she walks.  She lists cough as an associated symptom.  She feels off balance and will lean on the wall or other things to stabilize herself.  She feels lightheaded, tachycardic, and disoriented when she is off balance but will not experience any dizziness.  She sees spots before becoming off balance.    She can eat whatever she wants at dinner but she is unable to finish a full meal.  She feels full all the time.  She cannot remember a time when her eating habits were under control.  She will wake up every morning at 3-4 AM and feel wide awake.  She does not have any trouble falling asleep or trouble breathing while sleeping.     She stopped taking her albuterol 2-3 weeks ago after she ran out of her prescription.  She started taking Gabapentin when she first started experiencing abdominal pain but is unsure as to why she is still taking the medication.  She still takes 2 Gabapentin at bedtime.  She does not take Phenergan and does not experience nausea and vomiting anymore.  She had a CT of her abdomen performed at [facility] today.  Her CT scan was negative and she did not have any follow-up scheduled.  Her next appointment with Dr. Laney Bowen is August 19.       Past Medical History  Diagnosis Date  . COPD (chronic obstructive pulmonary disease)   .  Pneumonia 12-2011  . GERD (gastroesophageal reflux disease)   . Headache(784.0)   . Arthritis     osteoarthritis  . Allergy   . Depression   . Neuromuscular disorder   . Osteoporosis   . Bronchitis     CURRENTLY AS OF 06/30/12 - HAS COUGH AND FINISHED ANTIBIOTIC FOR BRONCHITIS  . Fibromyalgia   . Pain     ABDOMINAL PAIN AND NAUSEA  . Pain     SOMETIMES PAIN RIGHT EAR AND NECK--STATES CAUSED BY A "LUMP" ON BACK OF EAR--USES KENALOG CREAM TOPICALLY AS NEEDED.  Marland Kitchen Gastrocutaneous fistula   . Anemia   . Anxiety   . Blood transfusion without reported diagnosis    Past Surgical History  Procedure Laterality Date  . Abdominal hysterectomy    . Esophagogastroduodenoscopy  04/18/2012    Procedure: ESOPHAGOGASTRODUODENOSCOPY (EGD);  Surgeon: Inda Castle, MD;  Location: [facility];  Service: Endoscopy;  Laterality: N/A;  . Eus  05/29/2012    Procedure: UPPER ENDOSCOPIC ULTRASOUND (EUS) LINEAR;  Surgeon: Milus Banister, MD;  Location: [facility];  Service: Endoscopy;  Laterality: N/A;  . Appendectomy    . Spine surgery      CERVICAL SPINE SURGERY X 2 - INCLUDING FUSION; LUMBAR SURGERY FOR RUPTURED DISC  . Eye surgery      BILATERAL  CATARACT EXTRACTIONS  . Laparoscopic abdominal exploration N/A 07/02/2012    Procedure: converted to laparotomy with gastric biopsy;  Surgeon: Imogene Burn. Georgette Dover, MD;  Location: [facility];  Service: General;  Laterality: N/A;  Laparoscopic Gastric Biospy attempted.   . Laparotomy N/A 07/07/2012    Procedure: EXPLORATORY LAPAROTOMY repair of gastric perforation with omental graham patch, drainage of abdominal abcess;  Surgeon: Imogene Burn. Georgette Dover, MD;  Location: [facility];  Service: General;  Laterality: N/A;  . Stomach surgery  07/07/2012    Omental patch of gastric perforation  . Laparotomy N/A 07/13/2012    Procedure: EXPLORATORY LAPAROTOMY repair gastric leak;  Surgeon: Edward Jolly, MD;  Location: [facility];  Service: General;  Laterality: N/A;  . Laparotomy N/A  11/11/2012    Procedure: EXPLORATORY LAPAROTOMY;  Surgeon: Imogene Burn. Georgette Dover, MD;  Location: [place];  Service: General;  Laterality: N/A;  . Bowel resection N/A 11/11/2012    Procedure: SMALL BOWEL RESECTION;  Surgeon: Imogene Burn. Georgette Dover, MD;  Location: [place];  Service: General;  Laterality: N/A;  . Minor application of wound vac N/A 11/11/2012    Procedure: APPLICATION OF WOUND VAC;  Surgeon: Imogene Burn. Georgette Dover, MD;  Location: [place];  Service: General;  Laterality: N/A;  . Jejunostomy N/A 11/11/2012    Procedure: PLACEMENT OF FEEDING JEJUNOSTOMY TUBE;  Surgeon: Imogene Burn. Georgette Dover, MD;  Location: [place];  Service: General;  Laterality: N/A;  . Lysis of adhesion N/A 11/11/2012    Procedure: LYSIS OF ADHESION;  Surgeon: Imogene Burn. Georgette Dover, MD;  Location: [place];  Service: General;  Laterality: N/A;  . Hernia repair    . Gastric fistula repair      05/16/2013   Family History  Problem Relation Age of Onset  . COPD Mother   . Hyperlipidemia Mother   . Hypertension Maternal Grandmother   . Stroke Maternal Grandmother    History   Social History  . Marital Status: Single    Spouse Name: N/A    Number of Children: 0  . Years of Education: N/A   Occupational History  .     Social History Main Topics  . Smoking status: Former Smoker -- 0.50 packs/day for 35 years    Types: Cigarettes    Start date: 01/04/2003    Quit date: 04/17/2005  . Smokeless tobacco: Never Used  . Alcohol Use: No  . Drug Use: No  . Sexual Activity: No   Other Topics Concern  . Not on file   Social History Narrative   Single. Education: [employer]. Exercise.   Allergies  Allergen Reactions  . Avelox [Moxifloxacin Hcl In Nacl] Nausea And Vomiting  . Betadine [Povidone Iodine] Itching and Rash  . Alendronate Sodium Nausea And Vomiting  . Aspirin Nausea Only  . Codeine Nausea And Vomiting  . Doxycycline     Pt doesn't remember reaction  . Fluconazole     Pt doesn't remember reaction  . Hydrocodone Nausea And Vomiting     GI distress  . Latex   . Neurontin [Gabapentin] Other (See Comments)    Mood changes   . Nsaids Other (See Comments)    Severe gastritis & perforation - avoid NSAIDs when possible  . Sertraline Hcl Other (See Comments)    Hallucinations   . Sulfa Antibiotics Rash    Review of Systems  Respiratory: Positive for cough.   Cardiovascular: Positive for palpitations.  Gastrointestinal: Negative for constipation.  Musculoskeletal: Positive for arthralgias.  Neurological: Positive for light-headedness. Negative  for dizziness.  Psychiatric/Behavioral: Positive for sleep disturbance.     Objective:  Physical Exam  Nursing note and vitals reviewed. Constitutional: She is oriented to person, place, and time. She appears well-developed and well-nourished.  HENT:  Head: Normocephalic and atraumatic.  Right Ear: External ear normal.  Left Ear: External ear normal.  Nose: Nose normal.  Mouth/Throat: Oropharynx is clear and moist. No oropharyngeal exudate.  Eyes: Conjunctivae and EOM are normal. Pupils are equal, round, and reactive to light. No scleral icterus.  Neck: Normal range of motion. No thyromegaly present.  Cardiovascular: Normal rate, regular rhythm and normal heart sounds.  Exam reveals no gallop and no friction rub.   No murmur heard. Pulmonary/Chest: Effort normal and breath sounds normal. No respiratory distress. She has no wheezes. She has no rales.  Abdominal: Bowel sounds are normal.  Midline scar finally healed w/ one .25cm area of irritation-no pus//tender along scar but rest of abd supple  Musculoskeletal: She exhibits no edema.  Lymphadenopathy:    She has no cervical adenopathy.  Neurological: She is alert and oriented to person, place, and time. She has normal reflexes. No cranial nerve deficit. She exhibits normal muscle tone. Coordination normal.  Skin: Skin is warm and dry.  Psychiatric: She has a normal mood and affect. Her behavior is normal.   Wt Readings  from Last 3 Encounters:  11/17/13 91 lb 2 oz (41.334 kg)  10/28/13 93 lb (42.185 kg)  10/17/13 96 lb 9.6 oz (43.817 kg)    BP 100/64  Pulse 82  Temp(Src) 98.2 F (36.8 C) (Oral)  Resp 20  Ht 5' 3.62" (1.616 m)  Wt 91 lb 2 oz (41.334 kg)  BMI 15.83 kg/m2  SpO2 99% Assessment & Plan:    I have completed the patient encounter in its entirety as documented by the scribe, with editing by me where necessary. Hudson Majkowski P. Cynthia Bowen, M.D.  Dizzy-w/ nl exam  ?meds/?hypotens-frailty  SOB (shortness of breath)--Hx pulm eval ?copd then normal testing//hx of smoking years ago  Loss of weight after Generalized abdominal pain-status post multiple surgeries with complications, now stable  And needs to attend to calorie increase and weight improvement //if needed will try nutrition eval  Renal mass, left---Ct renal protocol rated as benign///did ask for repeat scan of liver at 6 mos  Pericardial effusion noted as small on scan  Femoral artery stenosis--watching for claudication  Patient Instructions  Decrease to 1  Gabapentin at bedtime --if no increase in pain  In one week may stop this med. Stop librax to see if dizziness resolves F/u appt 3 weeks

## 2013-11-27 ENCOUNTER — Telehealth: Payer: Self-pay

## 2013-11-27 ENCOUNTER — Other Ambulatory Visit: Payer: Self-pay | Admitting: *Deleted

## 2013-11-27 NOTE — Telephone Encounter (Signed)
Pt needs refill on oxyCODONE-acetaminophen (ROXICET) 5-325 MG per tablet [[id number]],[id number] glycol powder (GLYCOLAX/MIRALAX) powder [[id number], and  albuteral

## 2013-11-27 NOTE — Telephone Encounter (Signed)
This is a duplicate pt record. Will have IT close this.

## 2013-11-30 MED ORDER — POLYETHYLENE GLYCOL 3350 17 GM/SCOOP PO POWD: 17.0000 g | Freq: Every day | ORAL | Status: DC

## 2013-11-30 MED ORDER — OXYCODONE-ACETAMINOPHEN 5-325 MG PO TABS: 1.0000 | ORAL_TABLET | Freq: Three times a day (TID) | ORAL | Status: DC | PRN

## 2013-12-01 NOTE — Telephone Encounter (Signed)
Notified pt ready. 

## 2013-12-07 ENCOUNTER — Other Ambulatory Visit: Payer: Self-pay | Admitting: Internal Medicine

## 2013-12-09 ENCOUNTER — Ambulatory Visit (INDEPENDENT_AMBULATORY_CARE_PROVIDER_SITE_OTHER): Payer: Medicare Other | Admitting: Internal Medicine

## 2013-12-09 ENCOUNTER — Encounter: Payer: Self-pay | Admitting: Internal Medicine

## 2013-12-09 VITALS — BP 104/60 | HR 70 | Temp 98.9°F | Resp 16 | Ht 64.5 in | Wt 88.8 lb

## 2013-12-09 DIAGNOSIS — Z23 Encounter for immunization: Secondary | ICD-10-CM

## 2013-12-09 DIAGNOSIS — Z0000 Encounter for general adult medical examination without abnormal findings: Secondary | ICD-10-CM

## 2013-12-09 DIAGNOSIS — I739 Peripheral vascular disease, unspecified: Secondary | ICD-10-CM

## 2013-12-09 DIAGNOSIS — I70209 Unspecified atherosclerosis of native arteries of extremities, unspecified extremity: Secondary | ICD-10-CM

## 2013-12-09 DIAGNOSIS — I10 Essential (primary) hypertension: Secondary | ICD-10-CM

## 2013-12-09 DIAGNOSIS — R209 Unspecified disturbances of skin sensation: Secondary | ICD-10-CM

## 2013-12-09 DIAGNOSIS — L7682 Other postprocedural complications of skin and subcutaneous tissue: Secondary | ICD-10-CM

## 2013-12-09 LAB — POCT URINALYSIS DIPSTICK
BILIRUBIN UA: NEGATIVE
Blood, UA: NEGATIVE
GLUCOSE UA: NEGATIVE
KETONES UA: NEGATIVE
NITRITE UA: NEGATIVE
PH UA: 5.5
Protein, UA: NEGATIVE
Spec Grav, UA: 1.01
Urobilinogen, UA: 0.2

## 2013-12-09 LAB — POCT UA - MICROSCOPIC ONLY
Casts, Ur, LPF, POC: NEGATIVE
Crystals, Ur, HPF, POC: NEGATIVE
Mucus, UA: POSITIVE
YEAST UA: NEGATIVE

## 2013-12-09 MED ORDER — GABAPENTIN 300 MG PO CAPS: 600.0000 mg | ORAL_CAPSULE | Freq: Every day | ORAL | Status: DC

## 2013-12-09 MED ORDER — FLUOCINONIDE 0.05 % EX CREA: TOPICAL_CREAM | CUTANEOUS | Status: DC

## 2013-12-09 MED ORDER — OXYCODONE-ACETAMINOPHEN 5-325 MG PO TABS: 1.0000 | ORAL_TABLET | Freq: Three times a day (TID) | ORAL | Status: DC | PRN

## 2013-12-09 NOTE — Progress Notes (Signed)
Subjective:    Patient ID: Cynthia Bowen, female    DOB: [birth year], 68 y.o.   MRN: [id number] This chart was scribed for [name] Koyanagi, MD by Rosary Lively, ED scribe. This patient was seen in room Room/bed info not found and the patient's care was started at 1:04 PM.   HPI HPI Comments:  Cynthia Bowen is a 68 y.o. female who presents to [facility] for annual exam Patient Active Problem List   Diagnosis Date Noted  . Nephrolithiasis-asymptomatic/noted on scan 10/08/2013  . Other and unspecified ovarian cyst--noted on scans 2014 2015/asymptomatic 10/08/2013  . Pancreatic lesion-6 mm noted on MRI [facility] 10/08/2013  . Femoral artery stenosis-bilateral noted CT at [facility] 2015 10/08/2013  . Abdominal wall abscess 03/19/2013  . Cellulitis and abscess of trunk 02/22/2013  . Abdominal wall cellulitis 02/22/2013  . Protein calorie malnutrition 01/15/2013  . Iron deficiency anemia 01/15/2013  . Jejunostomy tube in situ 01/09/2013  . Acute respiratory failure 11/11/2012  . Altered mental status 11/11/2012  . HTN (hypertension) 11/11/2012  . 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 Hemoptysis 08/06/2012  . PE (pulmonary embolism) 08/05/2012  . Abdominal pain 08/05/2012  . Incarcerated epigastric hernia s/p primary repair 07/02/2012 07/11/2012  . Protein-calorie malnutrition, severe 07/11/2012  . 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 [id number] 07/10/2012  . Loss of weight 06/19/2012  . Anorexia 06/19/2012  . Unspecified gastritis and gastroduodenitis without mention of hemorrhage 04/18/2012  . Pericardial effusion 04/17/2012  . COPD GOLD I 01/29/2012  . Community acquired pneumonia 12/29/2011  . Back pain DDD S/p surgery [place] 1993 12/29/2011  . Depression 04/02/2011  . GERD (gastroesophageal reflux disease) 04/02/2011  . Osteoarthritis 04/02/2011  . Cervical neck pain with evidence of disc disease-s/p surgery [id number]  04/02/2011  . Fibromyalgia-[place] 2004 04/02/2011   Extensive problem list but for once she is doing fairly well.  Stomach: Pt reports that her stomach continues bothering her, however she has been able to tolerate food. She has incisional pain, but is at the lowest level of overall gastrointestinal dysfunction since her surgery with complications.  Ear Pain: Pt reports experiencing discomfort when cold air comes in contact with right ear.   Breathing issues: Pt denies issues with breathing, but reports experiencing severe pain in her legs upon walking long distances. Pt reports that she loves to walk, but has been unable to do so with pain. Pt reports that her feet are often cold, and she has to wear socks.    Immunizations and Tests: Pt reports that she had Pneumonia vaccine in 2012. Pt is UTD on Tetanus. Pt is unaware of last scan for bone density. Pt had a mammogram last year. Pt reports having a recent colonoscopy. Pt denies smoking and consumption of alcohol. Pt accepts flu shot. She is status post hysterectomy  Medications: Pt reports taking gabapentin, and takes oxycodone PRN needing much less medication. Pt states that she has only used albuterol inhaler once this week.   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 She is currently contemplating a move to be with her family in [place]  Past Surgical History  Procedure Laterality Date  . Abdominal hysterectomy    . Esophagogastroduodenoscopy  04/18/2012    Procedure: ESOPHAGOGASTRODUODENOSCOPY (EGD);  Surgeon: Inda Castle, MD;  Location: [facility];  Service: Endoscopy;  Laterality: N/A;  . Eus  05/29/2012    Procedure: UPPER ENDOSCOPIC ULTRASOUND (EUS) LINEAR;  Surgeon: Milus Banister, MD;  [facility];  Service: Endoscopy;  Laterality: N/A;  . Appendectomy    . Spine surgery      CERVICAL SPINE SURGERY X 2 -  INCLUDING FUSION; LUMBAR SURGERY FOR RUPTURED DISC  . Eye surgery      BILATERAL CATARACT EXTRACTIONS  . Laparoscopic abdominal exploration N/A 07/02/2012    Procedure: converted to laparotomy with gastric biopsy;  Surgeon: Imogene Burn. Georgette Dover, MD;  [facility];  Service: General;  Laterality: N/A;  Laparoscopic Gastric Biospy attempted.   . Laparotomy N/A 07/07/2012    Procedure: EXPLORATORY LAPAROTOMY repair of gastric perforation with omental graham patch, drainage of abdominal abcess;  Surgeon: Imogene Burn. Georgette Dover, MD;  [facility];  Service: General;  Laterality: N/A;  . Stomach surgery  07/07/2012    Omental patch of gastric perforation  . Laparotomy N/A 07/13/2012    Procedure: EXPLORATORY LAPAROTOMY repair gastric leak;  Surgeon: Edward Jolly, MD;  [facility];  Service: General;  Laterality: N/A;  . Laparotomy N/A 11/11/2012    Procedure: EXPLORATORY LAPAROTOMY;  Surgeon: Imogene Burn. Georgette Dover, MD;  Location: [place];  Service: General;  Laterality: N/A;  . Bowel resection N/A 11/11/2012    Procedure: SMALL BOWEL RESECTION;  Surgeon: Imogene Burn. Georgette Dover, MD;  Location: [place];  Service: General;  Laterality: N/A;  . Minor application of wound vac N/A 11/11/2012    Procedure: APPLICATION OF WOUND VAC;  Surgeon: Imogene Burn. Georgette Dover, MD;  Location: [place];  Service: General;  Laterality: N/A;  . Jejunostomy N/A 11/11/2012    Procedure: PLACEMENT OF FEEDING JEJUNOSTOMY TUBE;  Surgeon: Imogene Burn. Georgette Dover, MD;  Location: [place];  Service: General;  Laterality: N/A;  . Lysis of adhesion N/A 11/11/2012    Procedure: LYSIS OF ADHESION;  Surgeon: Imogene Burn. Georgette Dover, MD;  Location: [place];  Service: General;  Laterality: N/A;  . Hernia repair    . Gastric fistula repair      05/16/2013    Prior to Admission medications   Medication Sig Start Date End Date Taking? Authorizing Provider  albuterol (PROVENTIL HFA;VENTOLIN HFA) 108 (90 BASE) MCG/ACT inhaler Inhale 2 puffs into the lungs every 6 (six) hours as  needed for wheezing. 11/17/13 COPD Yes [name] Koyanagi, MD  diphenhydrAMINE (BENADRYL) 12.5 MG/5ML liquid Take 12.5 mg by mouth 4 (four) times daily as needed for itching.    Yes Historical Provider, MD  EPINEPHrine (EPIPEN) 0.3 mg/0.3 mL IJ SOAJ injection Inject 0.3 mLs (0.3 mg total) into the muscle daily as needed (anaphylaxis). 10/07/13  Yes [name] Koyanagi, MD  fluocinonide cream (LIDEX) 0.93 % APPLY 1 APPLICATION TOPICALLY 2 (TWO) TIMES DAILY. 12/09/13 eczema Yes [name] Koyanagi, MD  gabapentin (NEURONTIN) 300 MG capsule Take 2 capsules (600 mg total) by mouth at bedtime. 12/09/13 ddd Yes [name] Koyanagi, MD  oxyCODONE-acetaminophen (ROXICET) 5-325 MG per tablet Take 1 tablet by mouth every 8 (eight) hours as needed for severe pain. 12/09/13 Ddd/incis pain Yes [name] Koyanagi, MD  polyethylene glycol powder (GLYCOLAX/MIRALAX) powder Take 17 g by mouth daily.  const Yes [name] Koyanagi, MD  ketoconazole (NIZORAL) 2 % shampoo Apply 1 application topically 2 (two) times a week. 10/28/13 Scalp rash  [name] Koyanagi, MD   Mammogram is scheduled Review of Systems  HENT: Positive for ear pain.   Gastrointestinal: Positive for abdominal pain.  Musculoskeletal: Positive for myalgias.  Objective:   Physical Exam  Nursing note and vitals reviewed. Constitutional: She is oriented to person, place, and time. She appears well-developed and well-nourished. No distress.  HENT:  Head: Normocephalic and atraumatic.  Right Ear: External ear normal.  Left Ear: External ear normal.  Nose: Nose normal.  Mouth/Throat: Oropharynx is clear and moist.  Eyes: Conjunctivae and EOM are normal. Pupils are equal, round, and reactive to light.  Neck: Normal range of motion. Neck supple. No thyromegaly present.  Cardiovascular: Normal rate, regular rhythm, normal heart sounds and intact distal pulses.   No murmur heard. She has bilateral femoral bruits with diminished pulses posterior  tibial No carotid, abdominal or popliteal bruits noted  Pulmonary/Chest: Effort normal and breath sounds normal. She has no wheezes.  Breasts are without masses  Abdominal: Soft. Bowel sounds are normal. She exhibits no distension and no mass. There is no rebound.  Her incision continues to improve although she is still very tender around the entirely of the incision  Musculoskeletal: Normal range of motion. She exhibits no edema and no tenderness.  Lymphadenopathy:    She has no cervical adenopathy.  Neurological: She is alert and oriented to person, place, and time. She has normal reflexes. No cranial nerve deficit.  Skin: Skin is warm and dry.  Scattered mild eczematous changes  Psychiatric: She has a normal mood and affect. Her behavior is normal. Judgment and thought content normal.          Assessment & Plan:  I have completed the patient encounter in its entirety as documented by the scribe, with editing by me where necessary. Robert P. Laney Pastor, M.D.  Routine general medical examination at a health care facility  Essential hypertension -this diagnosis may need to be eliminated as she has been off medicine since before her surgery and has done well   Need for prophylactic vaccination and inoculation against influenza - Plan: Flu Vaccine QUAD 36+ mos IM  Need for prophylactic vaccination with Streptococcus pneumoniae (Pneumococcus) and Influenza vaccines - Plan: Pneumococcal conjugate vaccine 13-valent IM  Incisional pain - Plan: gabapentin (NEURONTIN) 300 MG capsule continues  Femoral artery stenosis Claudication of both lower extremities R>L-Doppler does not correlate well with the exam  Will refer to vascular surgery   Meds ordered this encounter  Medications  . oxyCODONE-acetaminophen (ROXICET) 5-325 MG per tablet    Sig: Take 1 tablet by mouth every 8 (eight) hours as needed for severe pain.    Dispense:  20 tablet    Refill:  0  . fluocinonide cream (LIDEX)  0.05 %    Sig: APPLY 1 APPLICATION TOPICALLY 2 (TWO) TIMES DAILY.    Dispense:  30 g    Refill:  4  . gabapentin (NEURONTIN) 300 MG capsule    Sig: Take 2 capsules (600 mg total) by mouth at bedtime.    Dispense:  60 capsule    Refill:  5

## 2013-12-16 ENCOUNTER — Ambulatory Visit (INDEPENDENT_AMBULATORY_CARE_PROVIDER_SITE_OTHER): Payer: Medicare Other | Admitting: Internal Medicine

## 2013-12-16 ENCOUNTER — Encounter: Payer: Self-pay | Admitting: Internal Medicine

## 2013-12-16 VITALS — BP 102/56 | HR 87 | Temp 99.0°F | Resp 16 | Ht 64.5 in | Wt 89.6 lb

## 2013-12-16 DIAGNOSIS — R319 Hematuria, unspecified: Secondary | ICD-10-CM

## 2013-12-16 DIAGNOSIS — I70209 Unspecified atherosclerosis of native arteries of extremities, unspecified extremity: Secondary | ICD-10-CM

## 2013-12-16 DIAGNOSIS — N200 Calculus of kidney: Secondary | ICD-10-CM

## 2013-12-16 LAB — POCT UA - MICROSCOPIC ONLY
CRYSTALS, UR, HPF, POC: NEGATIVE
Casts, Ur, LPF, POC: NEGATIVE
Epithelial cells, urine per micros: NEGATIVE
MUCUS UA: NEGATIVE
WBC, Ur, HPF, POC: NEGATIVE
Yeast, UA: NEGATIVE

## 2013-12-16 LAB — POCT URINALYSIS DIPSTICK
Bilirubin, UA: NEGATIVE
Glucose, UA: NEGATIVE
Ketones, UA: NEGATIVE
Nitrite, UA: NEGATIVE
Protein, UA: 100
Urobilinogen, UA: 0.2
pH, UA: 6

## 2013-12-16 NOTE — Progress Notes (Signed)
   Subjective:    Patient ID: Cynthia Bowen, female    DOB: 1945-10-02, 68 y.o.   MRN: [id number]  This chart was scribed for [name] Koyanagi, MD by Rosary Lively, ED scribe. This patient was seen in room Room/bed 21 and the patient's care was started at 3:20 PM.  Chief Complaint  Patient presents with  . Follow-up    previous OV on 12/09/2013 and per patient has blood in urine (a lot)     HPI HPI Comments:  Cynthia Bowen is a 68 y.o. female who presents to [facility] with concerns of hematuria. Pt reports that she is not having any symptoms beyond this.    Wt Readings from Last 3 Encounters:  12/16/13 89 lb 9.6 oz (40.642 kg)  12/09/13 88 lb 12.8 oz (40.279 kg)  11/17/13 91 lb 2 oz (41.334 kg)     Review of Systems  Genitourinary: Positive for hematuria. Negative for dysuria, urgency, frequency and difficulty urinating.       Objective:   Physical Exam  Nursing note and vitals reviewed. Constitutional: She is oriented to person, place, and time. She appears well-developed and well-nourished.  HENT:  Head: Normocephalic and atraumatic.  Eyes: EOM are normal.  Neck: Normal range of motion. Neck supple.  Cardiovascular: Normal rate.   Pulmonary/Chest: Effort normal.  Musculoskeletal: Normal range of motion.  Neurological: She is alert and oriented to person, place, and time.  Skin: Skin is warm and dry.  Psychiatric: She has a normal mood and affect. Her behavior is normal.  no cva tend to perc or abd tend to palp BP 102/56  Pulse 87  Temp(Src) 99 F (37.2 C) (Oral)  Resp 16  Ht 5' 4.5" (1.638 m)  Wt 89 lb 9.6 oz (40.642 kg)  BMI 15.15 kg/m2  SpO2 98%  Results for orders placed in visit on 12/16/13  POCT UA - MICROSCOPIC ONLY      Result Value Ref Range   WBC, Ur, HPF, POC neg     RBC, urine, microscopic tntc     Bacteria, U Microscopic 1+     Mucus, UA neg     Epithelial cells, urine per micros neg     Crystals, Ur, HPF, POC neg     Casts, Ur, LPF, POC neg      Yeast, UA neg    POCT URINALYSIS DIPSTICK      Result Value Ref Range   Color, UA brown     Clarity, UA turbid     Glucose, UA neg     Bilirubin, UA neg     Ketones, UA neg     Spec Grav, UA <=1.005     Blood, UA large     pH, UA 6.0     Protein, UA 100     Urobilinogen, UA 0.2     Nitrite, UA neg     Leukocytes, UA Trace          Assessment & Plan:   I have completed the patient encounter in its entirety as documented by the scribe, with editing by me where necessary. Daelan Gatt P. Laney Pastor, M.D. Hematuria - Plan: POCT UA - Microscopic Only, POCT urinalysis dipstick  Nephrolithiasis-asymptomatic/noted on scan  No need to repeat studies at this point if remains asympto

## 2013-12-17 ENCOUNTER — Other Ambulatory Visit: Payer: Self-pay | Admitting: *Deleted

## 2013-12-17 DIAGNOSIS — L98499 Non-pressure chronic ulcer of skin of other sites with unspecified severity: Principal | ICD-10-CM

## 2013-12-17 DIAGNOSIS — I739 Peripheral vascular disease, unspecified: Secondary | ICD-10-CM

## 2013-12-31 ENCOUNTER — Encounter: Payer: Self-pay | Admitting: Vascular Surgery

## 2014-01-14 ENCOUNTER — Encounter: Payer: Self-pay | Admitting: Vascular Surgery

## 2014-01-15 ENCOUNTER — Encounter: Payer: Self-pay | Admitting: Vascular Surgery

## 2014-01-15 ENCOUNTER — Other Ambulatory Visit (HOSPITAL_COMMUNITY): Payer: Federal, State, Local not specified - PPO

## 2014-01-15 ENCOUNTER — Encounter (HOSPITAL_COMMUNITY): Payer: Federal, State, Local not specified - PPO

## 2014-01-15 ENCOUNTER — Ambulatory Visit (INDEPENDENT_AMBULATORY_CARE_PROVIDER_SITE_OTHER): Payer: Medicare Other | Admitting: Vascular Surgery

## 2014-01-15 VITALS — BP 130/64 | HR 79 | Ht 64.5 in | Wt 88.3 lb

## 2014-01-15 DIAGNOSIS — I70219 Atherosclerosis of native arteries of extremities with intermittent claudication, unspecified extremity: Secondary | ICD-10-CM | POA: Insufficient documentation

## 2014-01-15 NOTE — Addendum Note (Signed)
Addended by: Mena Goes on: 01/15/2014 05:24 PM   Modules accepted: Orders

## 2014-01-15 NOTE — Progress Notes (Signed)
Referred by:  [name] Koyanagi, MD [address], [place] [address]  Reason for referral: bilateral calf pain  History of Present Illness  Cynthia Bowen is a 68 y.o. (Apr 05, 1946) female who presents with chief complaint: B leg pain.  Onset of symptom occurred 3 months ago after her abdominal surgery.  Pain is described as cramping, severity 3-5/10, and associated with walking >50 feet.  Patient has attempted to treat this pain with rest.  The patient has no rest pain symptoms also and no leg wounds/ulcers.  Atherosclerotic risk factors include: former smoker.  Past Medical History  Diagnosis Date  . COPD (chronic obstructive pulmonary disease)   . Pneumonia 12-2011  . GERD (gastroesophageal reflux disease)   . Headache(784.0)   . Arthritis     osteoarthritis  . Allergy   . Depression   . Neuromuscular disorder   . Osteoporosis   . Bronchitis     CURRENTLY AS OF 06/30/12 - HAS COUGH AND FINISHED ANTIBIOTIC FOR BRONCHITIS  . Fibromyalgia   . Pain     ABDOMINAL PAIN AND NAUSEA  . Pain     SOMETIMES PAIN RIGHT EAR AND NECK--STATES CAUSED BY A "LUMP" ON BACK OF EAR--USES KENALOG CREAM TOPICALLY AS NEEDED.  Marland Kitchen Gastrocutaneous fistula   . Anemia   . Anxiety   . Blood transfusion without reported diagnosis     Past Surgical History  Procedure Laterality Date  . Abdominal hysterectomy    . Esophagogastroduodenoscopy  04/18/2012    Procedure: ESOPHAGOGASTRODUODENOSCOPY (EGD);  Surgeon: Inda Castle, MD;  Location: [facility];  Service: Endoscopy;  Laterality: N/A;  . Eus  05/29/2012    Procedure: UPPER ENDOSCOPIC ULTRASOUND (EUS) LINEAR;  Surgeon: Milus Banister, MD;  Location: [facility];  Service: Endoscopy;  Laterality: N/A;  . Appendectomy    . Spine surgery      CERVICAL SPINE SURGERY X 2 - INCLUDING FUSION; LUMBAR SURGERY FOR RUPTURED DISC  . Eye surgery      BILATERAL CATARACT EXTRACTIONS  . Laparoscopic abdominal exploration N/A 07/02/2012    Procedure:  converted to laparotomy with gastric biopsy;  Surgeon: Imogene Burn. Georgette Dover, MD;  Location: [facility];  Service: General;  Laterality: N/A;  Laparoscopic Gastric Biospy attempted.   . Laparotomy N/A 07/07/2012    Procedure: EXPLORATORY LAPAROTOMY repair of gastric perforation with omental graham patch, drainage of abdominal abcess;  Surgeon: Imogene Burn. Georgette Dover, MD;  [facility];  Service: General;  Laterality: N/A;  . Stomach surgery  07/07/2012    Omental patch of gastric perforation  . Laparotomy N/A 07/13/2012    Procedure: EXPLORATORY LAPAROTOMY repair gastric leak;  Surgeon: Edward Jolly, MD;  [facility]: [facility];  Service: General;  Laterality: N/A;  . Laparotomy N/A 11/11/2012    Procedure: EXPLORATORY LAPAROTOMY;  Surgeon: Imogene Burn. Georgette Dover, MD;  Location: [facility];  Service: General;  Laterality: N/A;  . Bowel resection N/A 11/11/2012    Procedure: SMALL BOWEL RESECTION;  Surgeon: Imogene Burn. Georgette Dover, MD;  Location: [facility];  Service: General;  Laterality: N/A;  . Minor application of wound vac N/A 11/11/2012    Procedure: APPLICATION OF WOUND VAC;  Surgeon: Imogene Burn. Georgette Dover, MD;  Location: [facility];  Service: General;  Laterality: N/A;  . Jejunostomy N/A 11/11/2012    Procedure: PLACEMENT OF FEEDING JEJUNOSTOMY TUBE;  Surgeon: Imogene Burn. Georgette Dover, MD;  Location: [facility];  Service: General;  Laterality: N/A;  . Lysis of adhesion N/A 11/11/2012  Procedure: LYSIS OF ADHESION;  Surgeon: Imogene Burn. Georgette Dover, MD;  Location: [facility];  Service: General;  Laterality: N/A;  . Hernia repair    . Gastric fistula repair      05/16/2013    History   Social History  . Marital Status: Single    Spouse Name: N/A    Number of Children: 0  . Years of Education: N/A   Occupational History  .     Social History Main Topics  . Smoking status: Former Smoker -- 0.50 packs/day for 35 years    Types: Cigarettes    Start date: 01/04/2003    Quit date: 04/17/2005  . Smokeless tobacco: Never Used  . Alcohol Use: No  . Drug  Use: No  . Sexual Activity: No   Other Topics Concern  . Not on file   Social History Narrative   Single. Education: [employer]. Exercise.    Family History  Problem Relation Age of Onset  . COPD Mother   . Hyperlipidemia Mother   . Hypertension Maternal Grandmother   . Stroke Maternal Grandmother     Current Outpatient Prescriptions on File Prior to Visit  Medication Sig Dispense Refill  . albuterol (PROVENTIL HFA;VENTOLIN HFA) 108 (90 BASE) MCG/ACT inhaler Inhale 2 puffs into the lungs every 6 (six) hours as needed for wheezing.  1 Inhaler  2  . diphenhydrAMINE (BENADRYL) 12.5 MG/5ML liquid Take 12.5 mg by mouth 4 (four) times daily as needed for itching.       Marland Kitchen EPINEPHrine (EPIPEN) 0.3 mg/0.3 mL IJ SOAJ injection Inject 0.3 mLs (0.3 mg total) into the muscle daily as needed (anaphylaxis).  1 Device  3  . fluocinonide cream (LIDEX) 7.90 % APPLY 1 APPLICATION TOPICALLY 2 (TWO) TIMES DAILY.  30 g  4  . gabapentin (NEURONTIN) 300 MG capsule Take 2 capsules (600 mg total) by mouth at bedtime.  60 capsule  5  . ketoconazole (NIZORAL) 2 % shampoo Apply 1 application topically 2 (two) times a week.  120 mL  0  . oxyCODONE-acetaminophen (ROXICET) 5-325 MG per tablet Take 1 tablet by mouth every 8 (eight) hours as needed for severe pain.  20 tablet  0  . polyethylene glycol powder (GLYCOLAX/MIRALAX) powder Take 17 g by mouth daily.  255 g  1   No current facility-administered medications on file prior to visit.    Allergies  Allergen Reactions  . Avelox [Moxifloxacin Hcl In Nacl] Nausea And Vomiting  . Betadine [Povidone Iodine] Itching and Rash  . Alendronate Sodium Nausea And Vomiting  . Alendronate Sodium     Other reaction(s): Dizziness (intolerance)  . Aspirin Nausea Only  . Codeine Nausea And Vomiting  . Doxycycline     Pt doesn't remember reaction  . Fluconazole     Pt doesn't remember reaction  . Hydrocodone Nausea And Vomiting    GI distress  . Hydrocodone-Acetaminophen  Nausea And Vomiting  . Latex   . Neurontin [Gabapentin] Other (See Comments)    Mood changes   . Nsaids Other (See Comments)    Severe gastritis & perforation - avoid NSAIDs when possible  . Quinolones Itching and Hives  . Sertraline Hcl Other (See Comments)    Hallucinations   . Sertraline Hcl Nausea And Vomiting  . Sulfamethoxazole Itching and Hives  . Sulfa Antibiotics Rash    REVIEW OF SYSTEMS:  (Positives checked otherwise negative)  CARDIOVASCULAR:  [ ]  chest pain, [ ]  chest pressure, [ ]  palpitations, [ ]  shortness  of breath when laying flat, [ ]  shortness of breath with exertion,   [x]  pain in feet when walking, [ ]  pain in feet when laying flat, [ ]  history of blood clot in veins (DVT), [ ]  history of phlebitis, [ ]  swelling in legs, [ ]  varicose veins  PULMONARY:  [ ]  productive cough, [ ]  asthma, [ ]  wheezing  NEUROLOGIC:  [ ]  weakness in arms or legs, [ ]  numbness in arms or legs, [ ]  difficulty speaking or slurred speech, [ ]  temporary loss of vision in one eye, [ ]  dizziness  HEMATOLOGIC:  [ ]  bleeding problems, [ ]  problems with blood clotting too easily  MUSCULOSKEL:  [ ]  joint pain, [ ]  joint swelling  GASTROINTEST:  [ ]   Vomiting blood, [ ]   Blood in stool     GENITOURINARY:  [ ]   Burning with urination, [x]   Blood in urine  PSYCHIATRIC:  [ ]  history of major depression  INTEGUMENTARY:  [ ]  rashes, [ ]  ulcers  CONSTITUTIONAL:  [ ]  fever, [ ]  chills  For VQI Use Only  PRE-ADM LIVING: Home  AMB STATUS: Ambulatory  CAD Sx: None  PRIOR CHF: None  STRESS TEST: [x]  No, [ ]  Normal, [ ]  + ischemia, [ ]  + MI, [ ]  Both   Physical Examination Filed Vitals:   01/15/14 1520  BP: 130/64  Pulse: 79  Height: 5' 4.5" (1.638 m)  Weight: 88 lb 4.8 oz (40.053 kg)  SpO2: 100%   Body mass index is 14.93 kg/(m^2).  General: A&O x 3, WD, Cachectic   Head: [place]/AT  Ear/Nose/Throat: Hearing grossly intact, nares w/o erythema or drainage, oropharynx w/o  Erythema/Exudate  Eyes: PERRLA, EOMI  Neck: Supple, no nuchal rigidity, no palpable LAD  Pulmonary: Sym exp, good air movt, CTAB, no rales, rhonchi, & wheezing  Cardiac: RRR, Nl S1, S2, no Murmurs, rubs or gallops  Vascular: Vessel Right Left  Radial Palpable Palpable  Brachial  Palpable Palpable  Carotid Palpable, without bruit Palpable, without bruit  Aorta Not palpable N/A  Femoral Palpable Palpable  Popliteal Palpable Not palpable  PT Not Palpable Not Palpable  DP Faintly Palpable Palpable   Gastrointestinal: soft, NTND, -G/R, - HSM, - masses, - CVAT B, nearly healed except for small wound L to midline  Musculoskeletal: M/S 5/5 throughout , Extremities without ischemic changes   Neurologic: CN 2-12 intact , Pain and light touch intact in extremities , Motor exam as listed above  Psychiatric: Judgment intact, Mood & affect appropriatefor pt's clinical situation  Dermatologic: See M/S exam for extremity exam, no rashes otherwise noted  Lymph : No Cervical, Axillary, or Inguinal lymphadenopathy   Non-Invasive Vascular Imaging  Outside ABI (Date: 01/15/2014)  R: 0.70, DP: barely bi, PT: occluded, TBI: 0.37  L: 0.89, DP: tri, PT: occluded, TBI: 0.50  Outside Studies/Documentation 6 pages of outside documents were reviewed including: outpatient PCP.  Medical Decision Making  Cynthia Bowen is a 68 y.o. female who presents with: BLE intermittent claudication.   I discussed with the patient the natural history of intermittent claudication: 75% of patients have stable or improved symptoms in a year an only 2% require amputation. Eventually 20% may require intervention in a year.  I discussed in depth with the patient the nature of atherosclerosis, and emphasized the importance of maximal medical management including strict control of blood pressure, blood glucose, and lipid levels, antiplatelet agent, obtaining regular exercise, and cessation of smoking.    The  patient  is aware that without maximal medical management the underlying atherosclerotic disease process will progress, limiting the benefit of any interventions.  I discussed in depth with the patient a walking plan and how to execute such.  The patient is not interested in starting Pletal. The patient is currently not on a statin as not indicated. The patient is currently not on an anti-platelet: due to bleeding risk with recent gastric perforation.  The patient will follow up in 3 months with the following studies BLE ABI.    Thank you for allowing [place] to participate in this patient's care.  Adele Barthel, MD [facility] Office: [phone] Pager: [phone]  01/15/2014, 3:55 PM

## 2014-01-22 ENCOUNTER — Telehealth: Payer: Self-pay

## 2014-01-22 NOTE — Telephone Encounter (Signed)
Patient requesting a refill on her "Oxycodone (Roxicet) 5-325". Pleas call patient when ready to be picked up at [phone]

## 2014-01-24 MED ORDER — OXYCODONE-ACETAMINOPHEN 5-325 MG PO TABS: 1.0000 | ORAL_TABLET | Freq: Three times a day (TID) | ORAL | Status: DC | PRN

## 2014-01-24 NOTE — Telephone Encounter (Signed)
Spoke to pt, she is aware rx ready for p/u

## 2014-01-28 ENCOUNTER — Ambulatory Visit (INDEPENDENT_AMBULATORY_CARE_PROVIDER_SITE_OTHER): Payer: Medicare Other | Admitting: Family Medicine

## 2014-01-28 ENCOUNTER — Ambulatory Visit (INDEPENDENT_AMBULATORY_CARE_PROVIDER_SITE_OTHER): Payer: Medicare Other

## 2014-01-28 VITALS — BP 116/62 | HR 72 | Temp 98.6°F | Resp 18 | Wt 84.0 lb

## 2014-01-28 DIAGNOSIS — R1013 Epigastric pain: Secondary | ICD-10-CM

## 2014-01-28 DIAGNOSIS — R634 Abnormal weight loss: Secondary | ICD-10-CM

## 2014-01-28 DIAGNOSIS — R110 Nausea: Secondary | ICD-10-CM

## 2014-01-28 DIAGNOSIS — I70219 Atherosclerosis of native arteries of extremities with intermittent claudication, unspecified extremity: Secondary | ICD-10-CM

## 2014-01-28 LAB — POCT URINALYSIS DIPSTICK
Bilirubin, UA: NEGATIVE
Glucose, UA: NEGATIVE
KETONES UA: 15
Nitrite, UA: NEGATIVE
Protein, UA: NEGATIVE
RBC UA: NEGATIVE
SPEC GRAV UA: 1.015
UROBILINOGEN UA: 0.2
pH, UA: 6

## 2014-01-28 LAB — POCT UA - MICROSCOPIC ONLY
CRYSTALS, UR, HPF, POC: NEGATIVE
Casts, Ur, LPF, POC: NEGATIVE
MUCUS UA: NEGATIVE
Yeast, UA: NEGATIVE

## 2014-01-28 LAB — POCT CBC
GRANULOCYTE PERCENT: 54.6 % (ref 37–80)
HCT, POC: 45 % (ref 37.7–47.9)
HEMOGLOBIN: 13.8 g/dL (ref 12.2–16.2)
Lymph, poc: 3.2 (ref 0.6–3.4)
MCH: 28.5 pg (ref 27–31.2)
MCHC: 30.7 g/dL — AB (ref 31.8–35.4)
MCV: 92.7 fL (ref 80–97)
MID (cbc): 0.3 (ref 0–0.9)
MPV: 7.3 fL (ref 0–99.8)
POC Granulocyte: 4.3 (ref 2–6.9)
POC LYMPH PERCENT: 41 %L (ref 10–50)
POC MID %: 4.4 % (ref 0–12)
Platelet Count, POC: 328 10*3/uL (ref 142–424)
RBC: 4.86 M/uL (ref 4.04–5.48)
RDW, POC: 17.6 %
WBC: 7.8 10*3/uL (ref 4.6–10.2)

## 2014-01-28 MED ORDER — RANITIDINE HCL 150 MG PO TABS: 150.0000 mg | ORAL_TABLET | Freq: Two times a day (BID) | ORAL | Status: DC

## 2014-01-28 MED ORDER — GI COCKTAIL ~~LOC~~
30.0000 mL | Freq: Once | ORAL | Status: AC
Start: 2014-01-28 — End: 2014-01-28
  Administered 2014-01-28: 30 mL via ORAL

## 2014-01-28 MED ORDER — POLYETHYLENE GLYCOL 3350 17 GM/SCOOP PO POWD: 17.0000 g | Freq: Every day | ORAL | Status: DC

## 2014-01-28 NOTE — Patient Instructions (Signed)
1. Present to emergency department if your pain worsens overnight.  2. We will call you in the morning with the time of CT scan of abdomen.

## 2014-01-28 NOTE — Progress Notes (Signed)
Subjective:  Patient ID: Cynthia Bowen, female DOB: 03/29/1946, 68 y.o. MRN: [id number]  01/28/2014  Abdominal Pain    HPI  HPI  PCP: [name] Koyanagi, MD  HPI Comments:  Cynthia Bowen is a 68 y.o. female, with extensive medical Hx noted below and significant for abd hysterectomy, stomach surgery, gastric fistula repair, exploratory laporatomy, who presents to the [facility] complaining of sudden onset of waxing and waning generalized abd pain with associated abd swelling onset one week ago. Pt describes the pain as a stabbing pain and is in tears during exam due to the pain. Pt also complains of nausea (pt notes that she has nausea at baseline, however, it has been worse since her present abd pain began) and one episode of emesis when her Sx began 7 days ago. Pt reports taking Zofran for her nausea with some relief. Pt also complains that she is having a lot of indigestion. Pt denies any changes in her diet or lifestyle that may explain the sudden onset of the present Sx. Pt denies fever, chills, diaphoresis, constipation, melena, diarrhea, Hx of ulcers or dysuria. Pt denies ever trying a GI cocktail.    Pt further notes that she has been losing a lot of weight lately. Pt reports that she has been trying to eat normally but has been unable to do so due to her abd pain and swelling. Pt provides an example, stating that this morning she tried to eat a croissant sandwich, however, was able to eat only half the sandwich because her abd began to swell up. Pt states that at her thinnest she was 82 lbs.  Pt reports she takes Miralax at baseline and reports she last took a dose of it approximately one week ago. She is not sure if constipation is contributing to her current symptoms.  Denies urinary symptoms at this time. Denies fever/chills/sweats.  She took Percocet for abdominal pain without relief thus has not taken any further medication.  Review of Systems  Constitutional:  Positive for appetite change and unexpected weight change. Negative for fever, chills, diaphoresis and fatigue.  Respiratory: Negative for cough and shortness of breath.   Cardiovascular: Negative for chest pain, palpitations and leg swelling.  Gastrointestinal: Positive for nausea, vomiting, abdominal pain and abdominal distention. Negative for diarrhea, constipation, blood in stool, anal bleeding and rectal pain.  Genitourinary: Negative for dysuria, urgency, frequency, hematuria, vaginal discharge, genital sores and pelvic pain.  Skin: Negative for rash.  Psychiatric/Behavioral: Negative for confusion.     Past Medical History   Diagnosis  Date   .  COPD (chronic obstructive pulmonary disease)    .  Pneumonia  12-2011   .  GERD (gastroesophageal reflux disease)    .  Headache(784.0)    .  Arthritis      osteoarthritis   .  Allergy    .  Depression    .  Neuromuscular disorder    .  Osteoporosis    .  Bronchitis      CURRENTLY AS OF 06/30/12 - HAS COUGH AND FINISHED ANTIBIOTIC FOR BRONCHITIS   .  Fibromyalgia    .  Pain      ABDOMINAL PAIN AND NAUSEA   .  Pain      SOMETIMES PAIN RIGHT EAR AND NECK--STATES CAUSED BY A "LUMP" ON BACK OF EAR--USES KENALOG CREAM TOPICALLY AS NEEDED.   Marland Kitchen  Gastrocutaneous fistula    .  Anemia    .  Anxiety    .  Blood transfusion without reported diagnosis     Past Surgical History   Procedure  Laterality  Date   .  Abdominal hysterectomy     .  Esophagogastroduodenoscopy   04/18/2012     Procedure: ESOPHAGOGASTRODUODENOSCOPY (EGD); Surgeon: Inda Castle, MD; Location: [facility]; Service: Endoscopy; Laterality: N/A;   .  Eus   05/29/2012     Procedure: UPPER ENDOSCOPIC ULTRASOUND (EUS) LINEAR; Surgeon: Milus Banister, MD; Location: [facility]; Service: Endoscopy; Laterality: N/A;   .  Appendectomy     .  Spine surgery       CERVICAL SPINE SURGERY X 2 - INCLUDING FUSION; LUMBAR SURGERY FOR RUPTURED DISC   .  Eye surgery       BILATERAL  CATARACT EXTRACTIONS   .  Laparoscopic abdominal exploration  N/A  07/02/2012     Procedure: converted to laparotomy with gastric biopsy; Surgeon: Imogene Burn. Georgette Dover, MD; Location: [facility]; Service: General; Laterality: N/A; Laparoscopic Gastric Biospy attempted.   .  Laparotomy  N/A  07/07/2012     Procedure: EXPLORATORY LAPAROTOMY repair of gastric perforation with omental graham patch, drainage of abdominal abcess; Surgeon: Imogene Burn. Georgette Dover, MD; Location: [facility]; Service: General; Laterality: N/A;   .  Stomach surgery   07/07/2012     Omental patch of gastric perforation   .  Laparotomy  N/A  07/13/2012     Procedure: EXPLORATORY LAPAROTOMY repair gastric leak; Surgeon: Edward Jolly, MD; Location: [facility]; Service: General; Laterality: N/A;   .  Laparotomy  N/A  11/11/2012     Procedure: EXPLORATORY LAPAROTOMY; Surgeon: Imogene Burn. Georgette Dover, MD; Location: [facility]; Service: General; Laterality: N/A;   .  Bowel resection  N/A  11/11/2012     Procedure: SMALL BOWEL RESECTION; Surgeon: Imogene Burn. Georgette Dover, MD; Location: [facility]; Service: General; Laterality: N/A;   .  Minor application of wound vac  N/A  11/11/2012     Procedure: APPLICATION OF WOUND VAC; Surgeon: Imogene Burn. Georgette Dover, MD; Location: [facility]; Service: General; Laterality: N/A;   .  Jejunostomy  N/A  11/11/2012     Procedure: PLACEMENT OF FEEDING JEJUNOSTOMY TUBE; Surgeon: Imogene Burn. Georgette Dover, MD; Location: [facility]; Service: General; Laterality: N/A;   .  Lysis of adhesion  N/A  11/11/2012     Procedure: LYSIS OF ADHESION; Surgeon: Imogene Burn. Georgette Dover, MD; Location: [facility]; Service: General; Laterality: N/A;   .  Hernia repair     .  Gastric fistula repair       05/16/2013    Allergies   Allergen  Reactions   .  Avelox [Moxifloxacin Hcl In Nacl]  Nausea And Vomiting   .  Betadine [Povidone Iodine]  Itching and Rash   .  Alendronate Sodium  Nausea And Vomiting   .  Alendronate Sodium      Other reaction(s): Dizziness (intolerance)   .  Aspirin  Nausea Only    .  Codeine  Nausea And Vomiting   .  Doxycycline      Pt doesn't remember reaction   .  Fluconazole      Pt doesn't remember reaction   .  Hydrocodone  Nausea And Vomiting     GI distress   .  Hydrocodone-Acetaminophen  Nausea And Vomiting   .  Latex    .  Neurontin [Gabapentin]  Other (See Comments)     Mood changes   .  Nsaids  Other (See Comments)  Severe gastritis & perforation - avoid NSAIDs when possible   .  Quinolones  Itching and Hives   .  Sertraline Hcl  Other (See Comments)     Hallucinations   .  Sertraline Hcl  Nausea And Vomiting   .  Sulfamethoxazole  Itching and Hives   .  Sulfa Antibiotics  Rash    Current Outpatient Prescriptions   Medication  Sig  Dispense  Refill   .  albuterol (PROVENTIL HFA;VENTOLIN HFA) 108 (90 BASE) MCG/ACT inhaler  Inhale 2 puffs into the lungs every 6 (six) hours as needed for wheezing.  1 Inhaler  2   .  diphenhydrAMINE (BENADRYL) 12.5 MG/5ML liquid  Take 12.5 mg by mouth 4 (four) times daily as needed for itching.     Marland Kitchen  EPINEPHrine (EPIPEN) 0.3 mg/0.3 mL IJ SOAJ injection  Inject 0.3 mLs (0.3 mg total) into the muscle daily as needed (anaphylaxis).  1 Device  3   .  fluocinonide cream (LIDEX) 6.16 %  APPLY 1 APPLICATION TOPICALLY 2 (TWO) TIMES DAILY.  30 g  4   .  gabapentin (NEURONTIN) 300 MG capsule  Take 2 capsules (600 mg total) by mouth at bedtime.  60 capsule  5   .  ketoconazole (NIZORAL) 2 % shampoo  Apply 1 application topically 2 (two) times a week.  120 mL  0   .  oxyCODONE-acetaminophen (ROXICET) 5-325 MG per tablet  Take 1 tablet by mouth every 8 (eight) hours as needed for severe pain.  30 tablet  0   .  polyethylene glycol powder (GLYCOLAX/MIRALAX) powder  Take 17 g by mouth daily.  255 g  1   .  clonazePAM (KLONOPIN) 1 MG tablet       No current facility-administered medications for this visit.     Objective:    Triage Vitals: BP 116/62  Pulse 72  Temp(Src) 98.6 F (37 C) (Oral)  Resp 18  Wt 84 lb (38.102  kg)  SpO2 99%  Physical Exam  Results for orders placed in visit on 12/16/13   POCT UA - MICROSCOPIC ONLY   Result  Value  Ref Range    WBC, Ur, HPF, POC  neg     RBC, urine, microscopic  tntc     Bacteria, U Microscopic  1+     Mucus, UA  neg     Epithelial cells, urine per micros  neg     Crystals, Ur, HPF, POC  neg     Casts, Ur, LPF, POC  neg     Yeast, UA  neg    POCT URINALYSIS DIPSTICK   Result  Value  Ref Range    Color, UA  brown     Clarity, UA  turbid     Glucose, UA  neg     Bilirubin, UA  neg     Ketones, UA  neg     Spec Grav, UA  <=1.005     Blood, UA  large     pH, UA  6.0     Protein, UA  100     Urobilinogen, UA  0.2     Nitrite, UA  neg     Leukocytes, UA  Trace     [facility] reading (PRIMARY) by  Dr. Tamala Julian.  AAS: no free air; no obstruction; +ileus           Objective:   Physical Exam  Nursing note and vitals reviewed. Constitutional: She appears well-developed and  well-nourished. No distress.  HENT:  Head: Normocephalic and atraumatic.  Mouth/Throat: Oropharynx is clear and moist.  Eyes: Conjunctivae and EOM are normal. Pupils are equal, round, and reactive to light.  Neck: Normal range of motion. Neck supple. No thyromegaly present.  Cardiovascular: Normal rate, regular rhythm and normal heart sounds.  Exam reveals no gallop and no friction rub.   No murmur heard. Pulmonary/Chest: Effort normal and breath sounds normal. No respiratory distress. She has no wheezes. She has no rales.  Abdominal: Soft. She exhibits no distension and no mass. Bowel sounds are increased. There is no hepatosplenomegaly. There is tenderness (LUQ, RUQ and epigastric tenderness). There is guarding (mild guarding ). There is no rebound and no CVA tenderness.    Hyperactive bowel sounds  Musculoskeletal: Normal range of motion.  Lymphadenopathy:    She has no cervical adenopathy.  Neurological: She is alert.  Skin: Skin is warm and dry. She is not diaphoretic.  Small  pustular area along midline scar of abdomen to L.  No active drainage, erythema, tenderness, fluctuance.  Psychiatric: She has a normal mood and affect. Her behavior is normal.     [facility] reading (PRIMARY) by  Dr. Tamala Julian.  AAS: No free air; no obstruction.   GI COCKTAIL ADMINISTERED; NO IMPROVEMENT IN PAIN.   Assessment & Plan:  1. Abdominal pain, epigastric -New.  Obtain labs including CMET, Lipase, Amylase. Pt refused CT abdomen/pelvis tonight but agreeable to undergo CT tomorrow morning; will arrange CT. Advised to present to ED for acutely worsening pain.  Rx for Zantac provided.  Recommend restarting Miralax daily.  No improvement with GI cocktail to make PUD or gastritis less likely.  - POCT urinalysis dipstick - POCT UA - Microscopic Only - Comprehensive metabolic panel - Amylase - Lipase - DG Abd Acute W/Chest; Future - POCT CBC - gi cocktail (Maalox,Lidocaine,Donnatal); Take 30 mLs by mouth once. - CT Abdomen Pelvis W Contrast; Future - NM Hepato W/Eject Fract; Future  2. Loss of weight -Recurrent issue for patient; warrants close follow-up to avoid further weight loss.  Complicated GI history with multiple surgeries.   - Comprehensive metabolic panel  3. Nausea without vomiting -New.  Obtain labs; continue Zofran PRN.  Obtain CT abdomen/pelvis in a.m. To evaluate further.   - Comprehensive metabolic panel - Amylase - Lipase - gi cocktail (Maalox,Lidocaine,Donnatal); Take 30 mLs by mouth once. - CT Abdomen Pelvis W Contrast; Future - NM Hepato W/Eject Fract; Future  Meds ordered this encounter  Medications  . gi cocktail (Maalox,Lidocaine,Donnatal)    Sig:   . polyethylene glycol powder (GLYCOLAX/MIRALAX) powder    Sig: Take 17 g by mouth daily.    Dispense:  255 g    Refill:  1  . ranitidine (ZANTAC) 150 MG tablet    Sig: Take 1 tablet (150 mg total) by mouth 2 (two) times daily.    Dispense:  60 tablet    Refill:  2    No Follow-up on file.   I personally  performed the services described in this documentation, which was scribed in my presence.  The recorded information has been reviewed and is accurate.  Reginia Forts, M.D.  [facility] [address] [place], [place]  [address] [phone] phone [phone] fax

## 2014-01-29 ENCOUNTER — Ambulatory Visit (HOSPITAL_COMMUNITY)
Admission: RE | Admit: 2014-01-29 | Discharge: 2014-01-29 | Disposition: A | Discharge status: Home / Self Care | Payer: Medicare Other | Source: Ambulatory Visit | Attending: Family Medicine | Admitting: Family Medicine

## 2014-01-29 DIAGNOSIS — R110 Nausea: Secondary | ICD-10-CM | POA: Diagnosis not present

## 2014-01-29 DIAGNOSIS — R1013 Epigastric pain: Secondary | ICD-10-CM | POA: Insufficient documentation

## 2014-01-29 LAB — COMPREHENSIVE METABOLIC PANEL
ALK PHOS: 100 U/L (ref 39–117)
ALT: 14 U/L (ref 0–35)
AST: 20 U/L (ref 0–37)
Albumin: 4.2 g/dL (ref 3.5–5.2)
BILIRUBIN TOTAL: 0.5 mg/dL (ref 0.2–1.2)
BUN: 7 mg/dL (ref 6–23)
CO2: 27 meq/L (ref 19–32)
CREATININE: 0.87 mg/dL (ref 0.50–1.10)
Calcium: 9.9 mg/dL (ref 8.4–10.5)
Chloride: 103 mEq/L (ref 96–112)
Glucose, Bld: 71 mg/dL (ref 70–99)
Potassium: 3.8 mEq/L (ref 3.5–5.3)
SODIUM: 140 meq/L (ref 135–145)
TOTAL PROTEIN: 7.7 g/dL (ref 6.0–8.3)

## 2014-01-29 LAB — LIPASE: Lipase: 44 U/L (ref 0–75)

## 2014-01-29 LAB — AMYLASE: AMYLASE: 40 U/L (ref 0–105)

## 2014-01-29 MED ORDER — IOHEXOL 300 MG/ML  SOLN
50.0000 mL | Freq: Once | INTRAMUSCULAR | Status: AC | PRN
  Administered 2014-01-29: 50 mL via ORAL

## 2014-01-29 MED ORDER — IOHEXOL 300 MG/ML  SOLN
80.0000 mL | Freq: Once | INTRAMUSCULAR | Status: AC | PRN
  Administered 2014-01-29: 80 mL via INTRAVENOUS

## 2014-02-02 ENCOUNTER — Other Ambulatory Visit: Payer: Self-pay | Admitting: Radiology

## 2014-02-02 ENCOUNTER — Other Ambulatory Visit: Payer: Self-pay | Admitting: Family Medicine

## 2014-02-02 DIAGNOSIS — G8929 Other chronic pain: Secondary | ICD-10-CM

## 2014-02-02 DIAGNOSIS — R933 Abnormal findings on diagnostic imaging of other parts of digestive tract: Secondary | ICD-10-CM

## 2014-02-02 DIAGNOSIS — R1013 Epigastric pain: Principal | ICD-10-CM

## 2014-02-03 ENCOUNTER — Telehealth: Payer: Self-pay | Admitting: Gastroenterology

## 2014-02-03 ENCOUNTER — Telehealth: Payer: Self-pay

## 2014-02-03 NOTE — Telephone Encounter (Signed)
Dr. Tamala Julian,   Please advise. Thanks.

## 2014-02-03 NOTE — Telephone Encounter (Signed)
Patient has been seen by her primary care for Dr Sonia Baller for nausea and abdominal cramps. She takes daily Miralax for constipation. She reports she has had abdominal surgery at [facility]. She saw Dr Deatra Ina last 04/2012. She agrees to an appointment Friday.

## 2014-02-03 NOTE — Telephone Encounter (Signed)
Dr. Tamala Julian,   I have scheduled patient for [place] hepato w/eject fract. for 02/12/14 9 am. She is not sure why she is being referred to these places (like gastro and scan at [facility] [place]) and having scans done. She is very frustrated because she is ill and wants to get better. Its not clear whether she will go to these appointments. She said she might not make it to the scan. She says what is she supposed to do then before these appointments. I explained if she is worse she can come in, if she was prescribed anything by you to take her meds. I think she needs more clarification what is going on with her referrals and how we are handling her care- she requested this info and asked. I have tried and tried to encourage her to go, so that she can figure out what is wrong with her. She appeared confused who I was and where I was calling from despite saying repeaditly Dr. [facility] at [facility] in the middle of our conversation. I'm really concerned. What would you like referrals to do??  Patients best #: [phone]

## 2014-02-05 ENCOUNTER — Encounter: Payer: Self-pay | Admitting: Gastroenterology

## 2014-02-05 ENCOUNTER — Ambulatory Visit (INDEPENDENT_AMBULATORY_CARE_PROVIDER_SITE_OTHER): Payer: Medicare Other | Admitting: Gastroenterology

## 2014-02-05 VITALS — BP 94/50 | HR 64 | Ht 64.5 in | Wt 83.0 lb

## 2014-02-05 DIAGNOSIS — Z9889 Other specified postprocedural states: Secondary | ICD-10-CM

## 2014-02-05 DIAGNOSIS — I70219 Atherosclerosis of native arteries of extremities with intermittent claudication, unspecified extremity: Secondary | ICD-10-CM

## 2014-02-05 DIAGNOSIS — R938 Abnormal findings on diagnostic imaging of other specified body structures: Secondary | ICD-10-CM

## 2014-02-05 DIAGNOSIS — R1084 Generalized abdominal pain: Secondary | ICD-10-CM

## 2014-02-05 DIAGNOSIS — R9389 Abnormal findings on diagnostic imaging of other specified body structures: Secondary | ICD-10-CM

## 2014-02-05 MED ORDER — MOVIPREP 100 G PO SOLR: 1.0000 | ORAL | Status: DC

## 2014-02-05 NOTE — Patient Instructions (Signed)

## 2014-02-05 NOTE — Telephone Encounter (Signed)
I personally called patient last week on 01/29/14 with results of CT abdomen/pelvis and to discuss recommendations by her general surgeon at [facility].  Recommended the following:  1.  Liquid diet for 48-72 hours after last visit. To [facility] ED for acute worsening abdominal pain.  2. HIDA scan to evaluate gallbladder further because CT abdomen showed gallbladder dilatation and sludge.  3.  Referral to GI to evaluate abnormalities found in sigmoid colon; pt will need a colonoscopy. ( I see patient did keep GI appointment today.)  Spoke with patient to clarify questions and concerns.  Encouraged her to undergo colonoscopy, HIDA scan.

## 2014-02-08 ENCOUNTER — Encounter: Payer: Self-pay | Admitting: Gastroenterology

## 2014-02-08 ENCOUNTER — Telehealth: Payer: Self-pay | Admitting: *Deleted

## 2014-02-08 DIAGNOSIS — R9389 Abnormal findings on diagnostic imaging of other specified body structures: Secondary | ICD-10-CM | POA: Insufficient documentation

## 2014-02-08 DIAGNOSIS — Z9889 Other specified postprocedural states: Secondary | ICD-10-CM | POA: Insufficient documentation

## 2014-02-08 NOTE — Progress Notes (Signed)
02/08/2014 Cynthia Bowen [id number] Mar 06, 1946   History of Present Illness:  This is a 68 year old female how is previously known to Dr. Deatra Ina.  She was last seen by him in 04/2012 and she has been through multiple abdominal surgeries since that time.  Surgeries started here in 06/2012 with last one on 11/11/2012.  She says that after she was discharged here in 11/2012 that she ended up at [facility] and had more surgeries there, with the last one being around Christmastime and she was discharged in January 2015 (see EPIC and Care Everywhere for extensive details) .  She presents to our office today with complaints of abdominal pain that has been present for about 2 weeks.  It is located in her mid-lower abdomen and is described as being constant.  She admits to nausea and occasional vomiting.  She takes zofran but says that it doesn't help much.  She is moving her bowels successfully with Miralax.  She is passing flatus.  She denies any rectal bleeding.   CT scan of the abdomen and pelvis with contrast was ordered by her PCP and revealed the following:  "IMPRESSION:  Concern for potential neoplastic areas in the mid to distal sigmoid colon. Direct visualization advised.  Focal intussusception in the mid jejunum without obstruction in this area.  Question a degree of colonic ileus. No frank bowel obstruction.  No abscess. No free air.  Extensive atherosclerotic change in aorta. No bowel ischemia appreciable.  Gallbladder distended with apparent sludge.  There is new increased attenuation and a lower pole right renal cyst, consistent with either infection or hemorrhage within this cyst. There are multiple other renal cysts which appear stable.  Prominent liver with hepatic steatosis.  Small pericardial effusion persists."   Recent CBC, CMP, amylase, and lipase were WNL's.  Her last colonoscopy was in    Current Medications, Allergies, Past Medical History, Past Surgical History, Family  History and Social History were reviewed in [employer] record.   Physical Exam: BP 94/50  Pulse 64  Ht 5' 4.5" (1.638 m)  Wt 83 lb (37.649 kg)  BMI 14.03 kg/m2 General:  Very thin black female in no acute distress Head: Normocephalic and atraumatic Eyes:  Sclerae anicteric, conjunctiva pink  Ears: Normal auditory acuity Lungs: Clear throughout to auscultation Heart: Regular rate and rhythm Abdomen: Soft, non-distended.  BS present.  Multiple scars noted on abdomen.  ? Palpable mesh from hernia repair.  Diffuse TTP without R/R/G. Rectal:  Deferred. Musculoskeletal: Symmetrical with no gross deformities  Extremities: No edema  Neurological: Alert oriented x 4, grossly non-focal Psychological:  Alert and cooperative. Normal mood and affect  Assessment and Recommendations: -68 year old female with multiple abdominal surgeries since 06/2012 both here in [place] and at [facility].  Now with complaints of abdominal pain and abnormal CT scan showing a few abnormalities.  First, shows concern for potential neoplastic areas in the mid to distal sigmoid colon for which direct visualization was advised.  Also, showed focal intussusception in the mid jejunum without obstruction. And question of a degree of colonic ileus but no frank obstruction.   -Constipation:  Well-controlled with Miralax.  Will continue.  *We discussed need for colonoscopy to evaluate abnormal findings in the colon.  Will schedule with Dr. Deatra Ina.  The risks, benefits, and alternatives were discussed with the patient and she consents to proceed.  *Will order MR enterography to reassess area of intussusception for resolution and to evaluate  for lead point.  ? If this could be from area of scar tissue.

## 2014-02-08 NOTE — Telephone Encounter (Signed)
I called the patient to inform her of some instructions for her MR Enterography  we scheduled and the colonoscopy we scheduled. She left our office before we had a chance to explain these procedures. She said she needed to leave due to not feeling good.  She only signed one of the forms for the colonoscopy that will be done in the [facility].  I went over her instructions for the MR Enterography scheduled for 02-17-2014 at 3:00 PM . She is to arrive at 12:15 Pm for labs and then to drink the contrast.  When she was here on Friday 02-05-14, she did take the colonoscopy instructions we printed out for her procedure scheduled on 02-22-2014 in the [facility] at 10:30 and her arrival time is 9:30 am .  She is to pick her Moviprep (colonoscopy prep) up at the pharmacy, [address].I told her to call me if she has any questions about the instructions we gave her.

## 2014-02-08 NOTE — Progress Notes (Signed)
Kylie Gros, MPT Pager: [phone]  

## 2014-02-09 ENCOUNTER — Telehealth: Payer: Self-pay

## 2014-02-09 NOTE — Telephone Encounter (Signed)
Recommend patient return to office for re-evaluation (she was not having black stools when I saw her).

## 2014-02-09 NOTE — Telephone Encounter (Signed)
Pt was told by Dr. Tamala Julian to call if she had any changes in her Stool, woke up this morning and her Stool was black Please call pt at [phone]

## 2014-02-09 NOTE — Telephone Encounter (Signed)
Pt will come in to see Dr Tamala Julian only. She was transferred to billing to see if appt available tomorrow

## 2014-02-09 NOTE — Telephone Encounter (Signed)
Spoke with pt. She is still having abdominal pain. Pt noticed the black in her stool yesterday afternoon. It has been in multiple bowel movements.

## 2014-02-12 ENCOUNTER — Encounter (HOSPITAL_COMMUNITY)
Admission: RE | Admit: 2014-02-12 | Discharge: 2014-02-12 | Disposition: A | Discharge status: Home / Self Care | Payer: Medicare Other | Source: Ambulatory Visit | Attending: Family Medicine | Admitting: Family Medicine

## 2014-02-12 DIAGNOSIS — R110 Nausea: Secondary | ICD-10-CM | POA: Diagnosis present

## 2014-02-12 DIAGNOSIS — R1013 Epigastric pain: Secondary | ICD-10-CM | POA: Diagnosis present

## 2014-02-12 MED ORDER — SINCALIDE 5 MCG IJ SOLR
INTRAMUSCULAR | Status: AC
  Administered 2014-02-12: 0.82 ug via INTRAVENOUS
  Filled 2014-02-12: qty 5

## 2014-02-12 MED ORDER — TECHNETIUM TC 99M MEBROFENIN IV KIT
5.0000 | PACK | Freq: Once | INTRAVENOUS | Status: AC | PRN
  Administered 2014-02-12: 5 via INTRAVENOUS

## 2014-02-12 MED ORDER — SINCALIDE 5 MCG IJ SOLR
0.0200 ug/kg | Freq: Once | INTRAMUSCULAR | Status: AC
  Administered 2014-02-12: 0.82 ug via INTRAVENOUS

## 2014-02-15 ENCOUNTER — Telehealth: Payer: Self-pay

## 2014-02-15 MED ORDER — OXYCODONE-ACETAMINOPHEN 5-325 MG PO TABS: 1.0000 | ORAL_TABLET | Freq: Three times a day (TID) | ORAL | Status: DC | PRN

## 2014-02-15 NOTE — Telephone Encounter (Signed)
Pt states she is out of her OXYCODONE 5-325 MGS.  Please call [phone] and pt is come pick up

## 2014-02-16 NOTE — Telephone Encounter (Signed)
Pt notified that this is ready for p/u 

## 2014-02-17 ENCOUNTER — Ambulatory Visit (HOSPITAL_COMMUNITY): Payer: Medicare Other

## 2014-02-17 ENCOUNTER — Ambulatory Visit (HOSPITAL_COMMUNITY): Admission: RE | Admit: 2014-02-17 | Payer: Medicare Other | Source: Ambulatory Visit

## 2014-02-22 ENCOUNTER — Encounter: Payer: Medicare Other | Admitting: Gastroenterology

## 2014-02-25 ENCOUNTER — Ambulatory Visit (HOSPITAL_COMMUNITY)
Admission: RE | Admit: 2014-02-25 | Discharge: 2014-02-25 | Disposition: A | Discharge status: Home / Self Care | Payer: Medicare Other | Source: Ambulatory Visit | Attending: Diagnostic Radiology | Admitting: Diagnostic Radiology

## 2014-02-25 ENCOUNTER — Other Ambulatory Visit: Payer: Self-pay | Admitting: Gastroenterology

## 2014-02-25 ENCOUNTER — Ambulatory Visit (HOSPITAL_COMMUNITY): Payer: Medicare Other

## 2014-02-25 DIAGNOSIS — K8080 Other cholelithiasis without obstruction: Secondary | ICD-10-CM | POA: Diagnosis not present

## 2014-02-25 DIAGNOSIS — Z9889 Other specified postprocedural states: Secondary | ICD-10-CM

## 2014-02-25 DIAGNOSIS — K561 Intussusception: Secondary | ICD-10-CM | POA: Diagnosis not present

## 2014-02-25 DIAGNOSIS — R9389 Abnormal findings on diagnostic imaging of other specified body structures: Secondary | ICD-10-CM

## 2014-02-25 DIAGNOSIS — R1084 Generalized abdominal pain: Secondary | ICD-10-CM

## 2014-02-25 DIAGNOSIS — N281 Cyst of kidney, acquired: Secondary | ICD-10-CM | POA: Insufficient documentation

## 2014-03-01 ENCOUNTER — Ambulatory Visit (INDEPENDENT_AMBULATORY_CARE_PROVIDER_SITE_OTHER): Payer: Medicare Other | Admitting: Internal Medicine

## 2014-03-01 ENCOUNTER — Ambulatory Visit (INDEPENDENT_AMBULATORY_CARE_PROVIDER_SITE_OTHER): Payer: Medicare Other

## 2014-03-01 VITALS — BP 106/58 | HR 58 | Temp 98.1°F | Resp 14 | Ht 64.5 in | Wt 83.0 lb

## 2014-03-01 DIAGNOSIS — R634 Abnormal weight loss: Secondary | ICD-10-CM

## 2014-03-01 DIAGNOSIS — I70219 Atherosclerosis of native arteries of extremities with intermittent claudication, unspecified extremity: Secondary | ICD-10-CM

## 2014-03-01 DIAGNOSIS — R8281 Pyuria: Secondary | ICD-10-CM

## 2014-03-01 DIAGNOSIS — R1011 Right upper quadrant pain: Secondary | ICD-10-CM

## 2014-03-01 DIAGNOSIS — Z87442 Personal history of urinary calculi: Secondary | ICD-10-CM

## 2014-03-01 DIAGNOSIS — R319 Hematuria, unspecified: Secondary | ICD-10-CM

## 2014-03-01 DIAGNOSIS — R935 Abnormal findings on diagnostic imaging of other abdominal regions, including retroperitoneum: Secondary | ICD-10-CM

## 2014-03-01 DIAGNOSIS — Q6102 Congenital multiple renal cysts: Secondary | ICD-10-CM

## 2014-03-01 DIAGNOSIS — N390 Urinary tract infection, site not specified: Secondary | ICD-10-CM

## 2014-03-01 LAB — POCT UA - MICROSCOPIC ONLY
Casts, Ur, LPF, POC: NEGATIVE
Crystals, Ur, HPF, POC: NEGATIVE
Mucus, UA: NEGATIVE
YEAST UA: NEGATIVE

## 2014-03-01 LAB — POCT URINALYSIS DIPSTICK
BILIRUBIN UA: NEGATIVE
Glucose, UA: NEGATIVE
KETONES UA: NEGATIVE
Nitrite, UA: NEGATIVE
PH UA: 5.5
Protein, UA: NEGATIVE
Spec Grav, UA: 1.015
Urobilinogen, UA: 0.2

## 2014-03-01 MED ORDER — CILIDINIUM-CHLORDIAZEPOXIDE 2.5-5 MG PO CAPS: 1.0000 | ORAL_CAPSULE | Freq: Three times a day (TID) | ORAL | Status: DC

## 2014-03-01 MED ORDER — ONDANSETRON HCL 4 MG PO TABS: 4.0000 mg | ORAL_TABLET | Freq: Three times a day (TID) | ORAL | Status: DC | PRN

## 2014-03-01 NOTE — Progress Notes (Addendum)
Subjective:    Patient ID: Cynthia Bowen, female    DOB: 04-23-1946, 68 y.o.   MRN: [id number]  This chart was scribed for [name], MD by Erling Conte, Medical Scribe. This patient was seen in Room 13 and the patient's care was started at 5:34 PM.  Chief Complaint  Patient presents with  . Abdominal Pain    HPI HPI Comments: Cynthia Bowen is a 68 y.o. female who presents to the [facility] complaining of abdominal pain that has been going on for over a month. Pt presented to [facility] 1 month ago, 01/28/14, with the same symptoms. She had blood work done with no acute findings. Also had  abdominal CT that showed no signs of nephrolithiasis but did show some signs of cysts on her kidneys and possibly some abnormal findings of her colon. Pt states that she is having generalized sharp abdominal pain. It is worse with eating. Denies any pain with bowel movements. She was recommended to follow up with a GI specialist. Dr Deatra Ina has scheduled a colonoscopy. She notes some associated nausea. Pt has been taking oxycodone for the pain and said it showed some initial relief but then the pain will come back.  Her weight has been stable for the past month. Lost from 92-83 pounds since July 2015. Pt attributes this to not being able to eat due to the pain/nausea. She denies any emesis, fever, or urinary symptoms.   Note long recovery from gi surgery w/ multiple complivcations Patient Active Problem List   Diagnosis Date Noted  . Abnormal CT scan 02/08/2014  . H/O abdominal surgery 02/08/2014  . Atherosclerosis of native arteries of the extremities with intermittent claudication 01/15/2014  . Nephrolithiasis-asymptomatic/noted on scan 10/08/2013  . Other and unspecified ovarian cyst--noted on scans 2014 2015/asymptomatic 10/08/2013  . Pancreatic lesion-6 mm noted on [facility] at [facility] 10/08/2013  . Femoral artery stenosis-bilateral noted CT at [facility] 2015  10/08/2013  . Abdominal wall abscess 03/19/2013  . Cellulitis and abscess of trunk 02/22/2013  . Abdominal wall cellulitis 02/22/2013  . Protein calorie malnutrition 01/15/2013  . Iron deficiency anemia 01/15/2013  . Jejunostomy tube in situ 01/09/2013  . Acute respiratory failure 11/11/2012  . Altered mental status 11/11/2012  . HTN (hypertension) 11/11/2012  . 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 Hemoptysis 08/06/2012  . PE (pulmonary embolism) 08/05/2012  . Abdominal pain 08/05/2012  . Incarcerated epigastric hernia s/p primary repair 07/02/2012 07/11/2012  . Protein-calorie malnutrition, severe 07/11/2012  . 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 [id number] 07/10/2012  . Loss of weight 06/19/2012  . Anorexia 06/19/2012  . Unspecified gastritis and gastroduodenitis without mention of hemorrhage 04/18/2012  . Pericardial effusion 04/17/2012  . COPD GOLD I 01/29/2012  . Community acquired pneumonia 12/29/2011  . Back pain DDD S/p surgery [place] 1993 12/29/2011  . Depression 04/02/2011  . GERD (gastroesophageal reflux disease) 04/02/2011  . Osteoarthritis 04/02/2011  . Cervical neck pain with evidence of disc disease-s/p surgery [id number] 04/02/2011  . Fibromyalgia-Deveschwar 2004 04/02/2011      Prior to Admission medications   Medication Sig Start Date End Date Taking? Authorizing Provider  albuterol (PROVENTIL HFA;VENTOLIN HFA) 108 (90 BASE) MCG/ACT inhaler Inhale 2 puffs into the lungs every 6 (six) hours as needed for wheezing. 11/17/13  Yes [name] Koyanagi, MD  diphenhydrAMINE (BENADRYL) 12.5 MG/5ML liquid Take 12.5 mg by mouth 4 (four) times daily as needed for itching.    Yes Historical  Provider, MD  EPINEPHrine (EPIPEN) 0.3 mg/0.3 mL IJ SOAJ injection Inject 0.3 mLs (0.3 mg total) into the muscle daily as needed (anaphylaxis). 10/07/13  Yes [name] Koyanagi, MD  fluocinonide cream (LIDEX) 4.62 % APPLY 1 APPLICATION TOPICALLY 2 (TWO)  TIMES DAILY. 12/09/13  Yes [name] Koyanagi, MD  ketoconazole (NIZORAL) 2 % shampoo Apply 1 application topically 2 (two) times a week. 10/28/13  Yes [name] Koyanagi, MD  MOVIPREP 100 G SOLR Take 1 kit (200 g total) by mouth as directed. 02/05/14  Yes Jessica D. Zehr, PA-C  oxyCODONE-acetaminophen (ROXICET) 5-325 MG per tablet Take 1 tablet by mouth every 8 (eight) hours as needed for severe pain. 02/15/14  Yes [name] Koyanagi, MD  polyethylene glycol powder (GLYCOLAX/MIRALAX) powder Take 17 g by mouth daily. 01/28/14  Yes Wardell Honour, MD  ranitidine (ZANTAC) 150 MG tablet Take 1 tablet (150 mg total) by mouth 2 (two) times daily. 01/28/14  Yes Wardell Honour, MD      Review of Systems  Constitutional: Positive for appetite change (due to pain). Negative for fever and chills.  Gastrointestinal: Positive for nausea and abdominal pain. Negative for vomiting, diarrhea and blood in stool.  Genitourinary: Negative for urgency, hematuria and difficulty urinating.  All other systems reviewed and are negative.      Objective:   Physical Exam  Constitutional: She is oriented to person, place, and time. She appears well-developed and well-nourished. No distress.  HENT:  Head: Normocephalic and atraumatic.  Eyes: Conjunctivae and EOM are normal.  Neck: Neck supple.  Cardiovascular: Normal rate.   Pulmonary/Chest: Effort normal. No respiratory distress.  Abdominal: Soft. Normal appearance. There is no hepatomegaly. There is tenderness. No hernia.  Bowel sounds are hyperactive Tender to palp all over abd w/out rebound Scar finally healed  Musculoskeletal: Normal range of motion.  Neurological: She is alert and oriented to person, place, and time.  Skin: Skin is warm and dry.  Psychiatric: She has a normal mood and affect. Her behavior is normal.  Nursing note and vitals reviewed.    Filed Vitals:   03/01/14 1701  BP: 106/58  Pulse: 58  Temp: 98.1 F (36.7 C)  TempSrc: Oral    Resp: 14  Height: 5' 4.5" (1.638 m)  Weight: 83 lb (37.649 kg)  SpO2: 99%   [facility] reading (PRIMARY) by  Dr.Shamyra Farias=there is still a lot of air distending the colon with obstruction like in June  Results for orders placed or performed in visit on 03/01/14  POCT UA - Microscopic Only  Result Value Ref Range   WBC, Ur, HPF, POC 8-12    RBC, urine, microscopic 5-10 See recent res all w/ rbcs   Bacteria, U Microscopic trace    Mucus, UA neg    Epithelial cells, urine per micros 1-4    Crystals, Ur, HPF, POC neg    Casts, Ur, LPF, POC neg    Yeast, UA neg   POCT urinalysis dipstick  Result Value Ref Range   Color, UA yellow    Clarity, UA clear    Glucose, UA neg    Bilirubin, UA neg    Ketones, UA neg    Spec Grav, UA 1.015    Blood, UA moderate    pH, UA 5.5    Protein, UA neg    Urobilinogen, UA 0.2    Nitrite, UA neg    Leukocytes, UA small (1+)        Assessment & Plan:  RUQ pain - Plan: POCT UA - Microscopic Only, POCT urinalysis dipstick, DG Abd 2 Views  Hematuria - Plan: POCT UA - Microscopic Only, Urine culture  Multiple renal cysts  Hx of urinary stone  Loss of weight  Abnormal CT of the abdomen  Pyuria - Plan: Urine culture   Add librax again Proceed w/ colonoscopy Await cult urine--?recent passage stone--see imaging  Meds ordered this encounter  Medications  . clidinium-chlordiazePOXIDE (LIBRAX) 5-2.5 MG per capsule    Sig: Take 1 capsule by mouth 4 (four) times daily -  before meals and at bedtime. As needed for abdominal pain    Dispense:  60 capsule    Refill:  3  . ondansetron (ZOFRAN) 4 MG tablet    Sig: Take 1 tablet (4 mg total) by mouth every 8 (eight) hours as needed for nausea or vomiting.    Dispense:  20 tablet    Refill:  5    I have completed the patient encounter in its entirety as documented by the scribe, with editing by me where necessary. Ronell Boldin P. Laney Pastor, M.D.

## 2014-03-03 LAB — URINE CULTURE
Colony Count: NO GROWTH
Organism ID, Bacteria: NO GROWTH

## 2014-03-15 ENCOUNTER — Encounter: Payer: Self-pay | Admitting: Gastroenterology

## 2014-03-15 ENCOUNTER — Ambulatory Visit (INDEPENDENT_AMBULATORY_CARE_PROVIDER_SITE_OTHER): Payer: Medicare Other | Admitting: Internal Medicine

## 2014-03-15 VITALS — BP 112/56 | HR 86 | Temp 97.4°F | Resp 16 | Ht 63.75 in | Wt 84.6 lb

## 2014-03-15 DIAGNOSIS — I70219 Atherosclerosis of native arteries of extremities with intermittent claudication, unspecified extremity: Secondary | ICD-10-CM

## 2014-03-15 DIAGNOSIS — E46 Unspecified protein-calorie malnutrition: Secondary | ICD-10-CM

## 2014-03-15 DIAGNOSIS — M5442 Lumbago with sciatica, left side: Secondary | ICD-10-CM

## 2014-03-15 DIAGNOSIS — K3189 Other diseases of stomach and duodenum: Secondary | ICD-10-CM

## 2014-03-15 DIAGNOSIS — R109 Unspecified abdominal pain: Secondary | ICD-10-CM

## 2014-03-15 DIAGNOSIS — M5441 Lumbago with sciatica, right side: Secondary | ICD-10-CM

## 2014-03-15 MED ORDER — CILIDINIUM-CHLORDIAZEPOXIDE 2.5-5 MG PO CAPS: 1.0000 | ORAL_CAPSULE | Freq: Three times a day (TID) | ORAL | Status: DC

## 2014-03-15 MED ORDER — OXYCODONE-ACETAMINOPHEN 5-325 MG PO TABS: 1.0000 | ORAL_TABLET | Freq: Three times a day (TID) | ORAL | Status: DC | PRN

## 2014-03-15 NOTE — Progress Notes (Signed)
Subjective:  This chart was scribed for [employer]. Laney Pastor, MD by Terressa Koyanagi, ED Scribe. This patient was seen in room 10 and the patient's care was started at 7:59 PM.     Patient ID: Cynthia Bowen, female    DOB: 06-Aug-1945, 68 y.o.   MRN: [id number] Chief Complaint  Patient presents with  . Fall    this morning gooing to bathroom  . Abdominal Pain    HPI PCP: [name] Koyanagi, MD HPI Comment Cynthia Bowen is a 68 y.o. female, with medical Hx noted below, who presents to the [facility] for her recurrent intermittent abdominal pain.  Pt describes her pain as a  sharp pain in the epigastrium usually following eating. She also has intermittent distention with some lower quadrant abdominal pain that does not cause vomiting that may lead to the need to defecate. Pt reports that the pain continues to be worse with eating, however, the severity of the pain has improved since starting on Librax. She experiences the intermittent abd distension 2-3 x a day, generally following eating. Pt notes that subsequent bowel movements, however, relieves the abd distension.   Diarrhea: Pt also complains of onset of diarrhea yesterday, about 3 stools a day without fever or abdominal pain or blood in the stool.   Last Clinic Visit: For her ongoing problems, she was referred to a GI specialist Dr Deatra Ina for f/u and colonoscopy) there is an uncertain type of abnormality in the distal colon on scan)- however, her appointment is not until 05/2014.   Meds: Pt reports she needs a refill on her pain meds. Her other meds are in good order.  Weight: Pt's weight during triage today is 84 lb. stable since her last visit.  Denials: Pt denies fever, chills, dysuria, other urinary sx.   Patient Active Problem List   Diagnosis Date Noted  . Abnormal CT scan 02/08/2014  . H/O abdominal surgery 02/08/2014  . Atherosclerosis of native arteries of the extremities with intermittent claudication  01/15/2014  . Nephrolithiasis-asymptomatic/noted on scan 10/08/2013  . Other and unspecified ovarian cyst--noted on scans 2014 2015/asymptomatic 10/08/2013  . Pancreatic lesion-6 mm noted on MRI at [facility] 10/08/2013  . Femoral artery stenosis-bilateral noted CT at [facility] 2015 10/08/2013  . Abdominal wall abscess 03/19/2013  . Cellulitis and abscess of trunk 02/22/2013  . Abdominal wall cellulitis 02/22/2013  . Protein calorie malnutrition 01/15/2013  . Iron deficiency anemia 01/15/2013  . Jejunostomy tube in situ 01/09/2013  . Acute respiratory failure 11/11/2012  . Altered mental status 11/11/2012  . HTN (hypertension) 11/11/2012  . 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 Hemoptysis 08/06/2012  . PE (pulmonary embolism) 08/05/2012  . Abdominal pain 08/05/2012  . Incarcerated epigastric hernia s/p primary repair 07/02/2012 07/11/2012  . Protein-calorie malnutrition, severe 07/11/2012  . 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 [id number] 07/10/2012  . Loss of weight 06/19/2012  . Anorexia 06/19/2012  . Unspecified gastritis and gastroduodenitis without mention of hemorrhage 04/18/2012  . Pericardial effusion 04/17/2012  . COPD GOLD I 01/29/2012  . Community acquired pneumonia 12/29/2011  . Back pain DDD S/p surgery [place] 1993 12/29/2011  . Depression 04/02/2011  . GERD (gastroesophageal reflux disease) 04/02/2011  . Osteoarthritis 04/02/2011  . Cervical neck pain with evidence of disc disease-s/p surgery [id number] 04/02/2011  . Fibromyalgia-[place] 2004 04/02/2011    Past Surgical History  Procedure Laterality Date  . Abdominal hysterectomy    . Esophagogastroduodenoscopy  04/18/2012  Procedure: ESOPHAGOGASTRODUODENOSCOPY (EGD);  Surgeon: Inda Castle, MD;  Location: [facility];  Service: Endoscopy;  Laterality: N/A;  . Eus  05/29/2012    Procedure: UPPER ENDOSCOPIC ULTRASOUND (EUS) LINEAR;  Surgeon: Milus Banister,  MD;  Location: [facility];  Service: Endoscopy;  Laterality: N/A;  . Appendectomy    . Spine surgery      CERVICAL SPINE SURGERY X 2 - INCLUDING FUSION; LUMBAR SURGERY FOR RUPTURED DISC  . Eye surgery      BILATERAL CATARACT EXTRACTIONS  . Laparoscopic abdominal exploration N/A 07/02/2012    Procedure: converted to laparotomy with gastric biopsy;  Surgeon: Imogene Burn. Georgette Dover, MD;  Location: [facility];  Service: General;  Laterality: N/A;  Laparoscopic Gastric Biospy attempted.   . Laparotomy N/A 07/07/2012    Procedure: EXPLORATORY LAPAROTOMY repair of gastric perforation with omental graham patch, drainage of abdominal abcess;  Surgeon: Imogene Burn. Georgette Dover, MD;  Location: [facility];  Service: General;  Laterality: N/A;  . Stomach surgery  07/07/2012    Omental patch of gastric perforation  . Laparotomy N/A 07/13/2012    Procedure: EXPLORATORY LAPAROTOMY repair gastric leak;  Surgeon: Edward Jolly, MD;  Location: [facility];  Service: General;  Laterality: N/A;  . Laparotomy N/A 11/11/2012    Procedure: EXPLORATORY LAPAROTOMY;  Surgeon: Imogene Burn. Georgette Dover, MD;  Location: [place];  Service: General;  Laterality: N/A;  . Bowel resection N/A 11/11/2012    Procedure: SMALL BOWEL RESECTION;  Surgeon: Imogene Burn. Georgette Dover, MD;  Location: [place];  Service: General;  Laterality: N/A;  . Minor application of wound vac N/A 11/11/2012    Procedure: APPLICATION OF WOUND VAC;  Surgeon: Imogene Burn. Georgette Dover, MD;  Location: [place];  Service: General;  Laterality: N/A;  . Jejunostomy N/A 11/11/2012    Procedure: PLACEMENT OF FEEDING JEJUNOSTOMY TUBE;  Surgeon: Imogene Burn. Georgette Dover, MD;  Location: [place];  Service: General;  Laterality: N/A;  . Lysis of adhesion N/A 11/11/2012    Procedure: LYSIS OF ADHESION;  Surgeon: Imogene Burn. Georgette Dover, MD;  Location: [place];  Service: General;  Laterality: N/A;  . Hernia repair    . Gastric fistula repair      05/16/2013   Current Outpatient Prescriptions on File Prior to Visit  Medication Sig Dispense  Refill  . albuterol (PROVENTIL HFA;VENTOLIN HFA) 108 (90 BASE) MCG/ACT inhaler Inhale 2 puffs into the lungs every 6 (six) hours as needed for wheezing. 1 Inhaler 2  . diphenhydrAMINE (BENADRYL) 12.5 MG/5ML liquid Take 12.5 mg by mouth 4 (four) times daily as needed for itching.     Marland Kitchen EPINEPHrine (EPIPEN) 0.3 mg/0.3 mL IJ SOAJ injection Inject 0.3 mLs (0.3 mg total) into the muscle daily as needed (anaphylaxis). 1 Device 3  . fluocinonide cream (LIDEX) 2.63 % APPLY 1 APPLICATION TOPICALLY 2 (TWO) TIMES DAILY. 30 g 4  . ketoconazole (NIZORAL) 2 % shampoo Apply 1 application topically 2 (two) times a week. 120 mL 0  . MOVIPREP 100 G SOLR Take 1 kit (200 g total) by mouth as directed. 1 kit 0  . ondansetron (ZOFRAN) 4 MG tablet Take 1 tablet (4 mg total) by mouth every 8 (eight) hours as needed for nausea or vomiting. 20 tablet 5  . polyethylene glycol powder (GLYCOLAX/MIRALAX) powder Take 17 g by mouth daily. 255 g 1  . ranitidine (ZANTAC) 150 MG tablet Take 1 tablet (150 mg total) by mouth 2 (two) times daily. 60 tablet 2   No current facility-administered medications on file  prior to visit.     Review of Systems  Constitutional: Negative for fever and chills.  Gastrointestinal: Positive for abdominal pain and diarrhea.  Genitourinary: Negative for dysuria, urgency, frequency, decreased urine volume and difficulty urinating.  Psychiatric/Behavioral: Negative for confusion.       Objective:   Physical Exam  Constitutional: She is oriented to person, place, and time. She appears well-developed and well-nourished. No distress.  HENT:  Head: Normocephalic and atraumatic.  Eyes: Conjunctivae and EOM are normal.  Neck: Neck supple. No tracheal deviation present.  Cardiovascular: Normal rate.   Pulmonary/Chest: Effort normal. No respiratory distress.  Abdominal: She exhibits distension. She exhibits no mass. There is no tenderness.  Active bowel sounds. Abd slightly distended.     Musculoskeletal: Normal range of motion.  Neurological: She is alert and oriented to person, place, and time.  Skin: Skin is warm and dry.  Psychiatric: She has a normal mood and affect. Her behavior is normal.  Nursing note and vitals reviewed.  BP 112/56 mmHg  Pulse 86  Temp(Src) 97.4 F (36.3 C) (Oral)  Resp 16  Ht 5' 3.75" (1.619 m)  Wt 84 lb 9.6 oz (38.374 kg)  BMI 14.64 kg/m2  SpO2 98%     Assessment & Plan:    Problem #1 chronic recurrent abdominal pain post surgery with multiple complications--recurrent intussusception events/IBS/GERD/renal stones/few gallstones/4m pancreatic lesion/?colon lesion etc  Problem #2 recent CT scan suggesting Abnormality in colon 01/29/14=There are two somewhat circumferential areas of narrowing in the sigmoid colon in the mid to lower thirds, an appearance raising concern for potential neoplasia in the distal colon. Will have Dr KDeatra Inaevaluate  Problem #3 inability to regain presurgical weight--poor intake secondary to abdominal pain  Problem #4 anxiety disorder--complicating interpretation of symptoms  Problem #5 arteriolosclerosis with aortic and femoral lesions suspected-note vascular consult 01/15/14 w/ f/u 12/15  Problem #6 recent hematuria-resolved. Thought secondary to bleeding into a renal cyst that was noted on CT scan or from the presence of renal stones also seen on scan--she remains asymptomatic   Meds ordered this encounter  Medications  . clidinium-chlordiazePOXIDE (LIBRAX) 5-2.5 MG per capsule    Sig: Take 1 capsule by mouth 4 (four) times daily -  before meals and at bedtime. As needed for abdominal pain    Dispense:  120 capsule    Refill:  3  . oxyCODONE-acetaminophen (ROXICET) 5-325 MG per tablet    Sig: Take 1 tablet by mouth every 8 (eight) hours as needed for severe pain.    Dispense:  30 tablet    Refill:  0   Follow-up here 1 month I personally performed the services described in this documentation, which  was scribed in my presence. The recorded information has been reviewed and is accurate.

## 2014-03-16 DIAGNOSIS — R109 Unspecified abdominal pain: Secondary | ICD-10-CM | POA: Insufficient documentation

## 2014-04-01 ENCOUNTER — Other Ambulatory Visit: Payer: Self-pay | Admitting: Internal Medicine

## 2014-04-13 ENCOUNTER — Other Ambulatory Visit: Payer: Self-pay | Admitting: Family Medicine

## 2014-04-29 ENCOUNTER — Encounter: Payer: Self-pay | Admitting: Family

## 2014-04-30 ENCOUNTER — Ambulatory Visit (HOSPITAL_COMMUNITY)
Admission: RE | Admit: 2014-04-30 | Discharge: 2014-04-30 | Disposition: A | Discharge status: Home / Self Care | Payer: Medicare Other | Source: Ambulatory Visit | Attending: Family | Admitting: Family

## 2014-04-30 ENCOUNTER — Other Ambulatory Visit: Payer: Self-pay | Admitting: Internal Medicine

## 2014-04-30 ENCOUNTER — Ambulatory Visit (INDEPENDENT_AMBULATORY_CARE_PROVIDER_SITE_OTHER): Payer: Medicare Other | Admitting: Family

## 2014-04-30 ENCOUNTER — Encounter: Payer: Self-pay | Admitting: Family

## 2014-04-30 VITALS — BP 110/70 | HR 76 | Resp 16 | Ht 64.5 in | Wt 86.9 lb

## 2014-04-30 DIAGNOSIS — I739 Peripheral vascular disease, unspecified: Secondary | ICD-10-CM | POA: Diagnosis not present

## 2014-04-30 DIAGNOSIS — Z8679 Personal history of other diseases of the circulatory system: Secondary | ICD-10-CM | POA: Diagnosis not present

## 2014-04-30 DIAGNOSIS — Z87891 Personal history of nicotine dependence: Secondary | ICD-10-CM | POA: Diagnosis not present

## 2014-04-30 DIAGNOSIS — I70219 Atherosclerosis of native arteries of extremities with intermittent claudication, unspecified extremity: Secondary | ICD-10-CM

## 2014-04-30 NOTE — Progress Notes (Signed)
[facility] [place] HISTORY AND PHYSICAL -PAD  History of Present Illness Cynthia Bowen is a 69 y.o. female patient of Dr. Bridgett Larsson who returns for 3 months evaluation of BLE intermittent claudication. She was last seen by Dr. Bridgett Larsson in September 2015 for initial evaluation.  This is the patient's first evaluation by me.  Onset of symptom occurred after her abdominal surgery. At her September visit her pain was described as cramping, severity 3-5/10, and associated with walking >50 feet. Patient attempted to treat this pain with rest. The patient has no rest pain symptoms also and no leg wounds/ulcers. Atherosclerotic risk factors include: former smoker, quit in 2006.   The patient is currently not on a statin as not indicated.  The patient is currently not on an anti-platelet: due to bleeding risk with recent gastric perforation. Today she has no claudication symptoms with walking over an hour daily; she is faithful at her graduated walking program.  Pt denies any history of stroke or TIA, denies any cardiac problems. Pt states she has a big appetite, she is not able to gain weight, is not losing weight. She denies post prandial abdominal pain but is unable to eat much since so much of her stomach was removed.  The patient denies New Medical or Surgical History.  Pt Diabetic: No   Past Medical History  Diagnosis Date  . COPD (chronic obstructive pulmonary disease)   . Pneumonia 12-2011  . GERD (gastroesophageal reflux disease)   . Headache(784.0)   . Arthritis     osteoarthritis  . Allergy   . Depression   . Neuromuscular disorder   . Osteoporosis   . Bronchitis     CURRENTLY AS OF 06/30/12 - HAS COUGH AND FINISHED ANTIBIOTIC FOR BRONCHITIS  . Fibromyalgia   . Pain     ABDOMINAL PAIN AND NAUSEA  . Pain     SOMETIMES PAIN RIGHT EAR AND NECK--STATES CAUSED BY A "LUMP" ON BACK OF EAR--USES KENALOG CREAM TOPICALLY AS NEEDED.  Marland Kitchen Gastrocutaneous fistula   .  Anemia   . Anxiety   . Blood transfusion without reported diagnosis     Social History History  Substance Use Topics  . Smoking status: Former Smoker -- 0.50 packs/day for 35 years    Types: Cigarettes    Start date: 01/04/2003    Quit date: 04/17/2005  . Smokeless tobacco: Never Used  . Alcohol Use: No    Family History Family History  Problem Relation Age of Onset  . COPD Mother   . Hyperlipidemia Mother   . Hypertension Maternal Grandmother   . Stroke Maternal Grandmother     Past Surgical History  Procedure Laterality Date  . Abdominal hysterectomy    . Esophagogastroduodenoscopy  04/18/2012    Procedure: ESOPHAGOGASTRODUODENOSCOPY (EGD);  Surgeon: Inda Castle, MD;  Location: [facility];  Service: Endoscopy;  Laterality: N/A;  . Eus  05/29/2012    Procedure: UPPER ENDOSCOPIC ULTRASOUND (EUS) LINEAR;  Surgeon: Milus Banister, MD;  [facility];  Service: Endoscopy;  Laterality: N/A;  . Appendectomy    . Spine surgery      CERVICAL SPINE SURGERY X 2 - INCLUDING FUSION; LUMBAR SURGERY FOR RUPTURED DISC  . Eye surgery      BILATERAL CATARACT EXTRACTIONS  . Laparoscopic abdominal exploration N/A 07/02/2012    Procedure: converted to laparotomy with gastric biopsy;  Surgeon: Imogene Burn. Georgette Dover, MD;  Location: [facility];  Service: General;  Laterality: N/A;  Laparoscopic Gastric  Biospy attempted.   . Laparotomy N/A 07/07/2012    Procedure: EXPLORATORY LAPAROTOMY repair of gastric perforation with omental graham patch, drainage of abdominal abcess;  Surgeon: Imogene Burn. Georgette Dover, MD;  Location: [facility];  Service: General;  Laterality: N/A;  . Stomach surgery  07/07/2012    Omental patch of gastric perforation  . Laparotomy N/A 07/13/2012    Procedure: EXPLORATORY LAPAROTOMY repair gastric leak;  Surgeon: Edward Jolly, MD;  Location: [facility];  Service: General;  Laterality: N/A;  . Laparotomy N/A 11/11/2012    Procedure: EXPLORATORY LAPAROTOMY;  Surgeon: Imogene Burn.  Georgette Dover, MD;  Location: [place];  Service: General;  Laterality: N/A;  . Bowel resection N/A 11/11/2012    Procedure: SMALL BOWEL RESECTION;  Surgeon: Imogene Burn. Georgette Dover, MD;  Location: [place];  Service: General;  Laterality: N/A;  . Minor application of wound vac N/A 11/11/2012    Procedure: APPLICATION OF WOUND VAC;  Surgeon: Imogene Burn. Georgette Dover, MD;  Location: [place];  Service: General;  Laterality: N/A;  . Jejunostomy N/A 11/11/2012    Procedure: PLACEMENT OF FEEDING JEJUNOSTOMY TUBE;  Surgeon: Imogene Burn. Georgette Dover, MD;  Location: [place];  Service: General;  Laterality: N/A;  . Lysis of adhesion N/A 11/11/2012    Procedure: LYSIS OF ADHESION;  Surgeon: Imogene Burn. Georgette Dover, MD;  Location: [place];  Service: General;  Laterality: N/A;  . Hernia repair    . Gastric fistula repair      05/16/2013    Allergies  Allergen Reactions  . Avelox [Moxifloxacin Hcl In Nacl] Nausea And Vomiting  . Betadine [Povidone Iodine] Itching and Rash  . Alendronate Sodium Nausea And Vomiting  . Alendronate Sodium     Other reaction(s): Dizziness (intolerance)  . Aspirin Nausea Only  . Codeine Nausea And Vomiting  . Doxycycline     Pt doesn't remember reaction  . Fluconazole     Pt doesn't remember reaction  . Hydrocodone Nausea And Vomiting    GI distress  . Hydrocodone-Acetaminophen Nausea And Vomiting  . Latex   . Neurontin [Gabapentin] Other (See Comments)    Mood changes   . Nsaids Other (See Comments)    Severe gastritis & perforation - avoid NSAIDs when possible  . Quinolones Itching and Hives  . Sertraline Hcl Other (See Comments)    Hallucinations   . Sertraline Hcl Nausea And Vomiting  . Sulfamethoxazole Itching and Hives  . Sulfa Antibiotics Rash    Current Outpatient Prescriptions  Medication Sig Dispense Refill  . albuterol (PROVENTIL HFA;VENTOLIN HFA) 108 (90 BASE) MCG/ACT inhaler Inhale 2 puffs into the lungs every 6 (six) hours as needed for wheezing. 1 Inhaler 2  . clidinium-chlordiazePOXIDE  (LIBRAX) 5-2.5 MG per capsule Take 1 capsule by mouth 4 (four) times daily -  before meals and at bedtime. As needed for abdominal pain 120 capsule 3  . diphenhydrAMINE (BENADRYL) 12.5 MG/5ML liquid Take 12.5 mg by mouth 4 (four) times daily as needed for itching.     Marland Kitchen EPINEPHrine (EPIPEN) 0.3 mg/0.3 mL IJ SOAJ injection Inject 0.3 mLs (0.3 mg total) into the muscle daily as needed (anaphylaxis). 1 Device 3  . fluocinonide cream (LIDEX) 5.91 % APPLY 1 APPLICATION TOPICALLY 2 (TWO) TIMES DAILY. 30 g 4  . gabapentin (NEURONTIN) 300 MG capsule   5  . ketoconazole (NIZORAL) 2 % shampoo Apply 1 application topically 2 (two) times a week. 120 mL 0  . MOVIPREP 100 G SOLR Take 1 kit (200 g total) by mouth  as directed. 1 kit 0  . ondansetron (ZOFRAN) 4 MG tablet Take 1 tablet (4 mg total) by mouth every 8 (eight) hours as needed for nausea or vomiting. 20 tablet 5  . ondansetron (ZOFRAN-ODT) 4 MG disintegrating tablet   1  . oxyCODONE-acetaminophen (ROXICET) 5-325 MG per tablet Take 1 tablet by mouth every 8 (eight) hours as needed for severe pain. 30 tablet 0  . polyethylene glycol powder (GLYCOLAX/MIRALAX) powder Take 17 g by mouth daily. 255 g 1  . polyethylene glycol powder (GLYCOLAX/MIRALAX) powder TAKE 17 G BY MOUTH DAILY. 255 g 5  . ranitidine (ZANTAC) 150 MG tablet TAKE 1 TABLET BY MOUTH TWICE A DAY 60 tablet 4   No current facility-administered medications for this visit.    ROS: See HPI for pertinent positives and negatives.   Physical Examination  Filed Vitals:   04/30/14 1349  BP: 110/70  Pulse: 76  Resp: 16  Height: 5' 4.5" (1.638 m)  Weight: 86 lb 14.4 oz (39.418 kg)  SpO2: 100%   Body mass index is 14.69 kg/(m^2).  General: A&O x 3, WD, Cachectic, voice is husky  Eyes: PERRLA  Pulmonary: Sym exp, good air movt, CTAB, no rales, rhonchi, & wheezing  Cardiac: RRR, Nl S1, S2, no detected murmur  Vascular: Vessel Right Left  Radial 2+Palpable Not Palpable   Brachial 2+Palpable 2+Palpable  Carotid Palpable, without bruit Palpable, without bruit  Aorta Not palpable N/A  Femoral 1+Palpable Faintly Palpable  Popliteal Palpable Not palpable  PT Not Palpable Not Palpable  DP Faintly Palpable Not Palpable   Gastrointestinal: soft, NTND, -G/R, - HSM, - palpable masses, - CVAT B.  Musculoskeletal: M/S 5/5 throughout , Extremities without ischemic changes   Neurologic: CN 2-12 intact , Pain and light touch intact in extremities , Motor exam as listed above  Psychiatric: Judgment intact, Mood & affect appropriatefor pt's clinical situation    Non-Invasive Vascular Imaging: DATE: 04/30/2014 ABI: RIGHT 0.62 (01/15/14, 0.70), Waveforms: monophasic, PT is not detected;  LEFT 0.67 (0.89), Waveforms: monophasic, PT not detected.    ASSESSMENT: Cynthia Bowen is a 69 y.o. female who presents with: BLE intermittent claudication. She is walking an hour plus daily with no claudication symptoms, no tissue loss. Her ABI's have worsened in the last 3 months but her claudication has resolved. Bilateral posterior tibial pulses are not detectable by Doppler, remaining bilateral pulses are monophasic. Face to face time with patient was 30 minutes. Over 50% of this time was spent on counseling and coordination of care.   PLAN:  Continue graduated walking program and maximal medical management of atherosclerotic risk factors.   I discussed in depth with the patient the nature of atherosclerosis, and emphasized the importance of maximal medical management including strict control of blood pressure, blood glucose, and lipid levels, obtaining regular exercise, and continued cessation of smoking.  The patient is aware that without maximal medical management the underlying atherosclerotic disease process will progress, limiting the benefit of any interventions.  Based on the patient's vascular studies and examination, pt will return to clinic in 6  months for ABI's.   The patient was given information about PAD including signs, symptoms, treatment, what symptoms should prompt the patient to seek immediate medical care, and risk reduction measures to take.  Clemon Chambers, RN, MSN, FNP-C [facility] Phone: [phone]  Clinic MD: Bridgett Larsson  04/30/2014 1:41 PM

## 2014-04-30 NOTE — Patient Instructions (Addendum)
Peripheral Vascular Disease Peripheral Vascular Disease (PVD), also called Peripheral Arterial Disease (PAD), is a circulation problem caused by cholesterol (atherosclerotic plaque) deposits in the arteries. PVD commonly occurs in the lower extremities (legs) but it can occur in other areas of the body, such as your arms. The cholesterol buildup in the arteries reduces blood flow which can cause pain and other serious problems. The presence of PVD can place a person at risk for Coronary Artery Disease (CAD).  CAUSES  Causes of PVD can be many. It is usually associated with more than one risk factor such as:   High Cholesterol.  Smoking.  Diabetes.  Lack of exercise or inactivity.  High blood pressure (hypertension).  Obesity.  Family history. SYMPTOMS   When the lower extremities are affected, patients with PVD may experience:  Leg pain with exertion or physical activity. This is called INTERMITTENT CLAUDICATION. This may present as cramping or numbness with physical activity. The location of the pain is associated with the level of blockage. For example, blockage at the abdominal level (distal abdominal aorta) may result in buttock or hip pain. Lower leg arterial blockage may result in calf pain.  As PVD becomes more severe, pain can develop with less physical activity.  In people with severe PVD, leg pain may occur at rest.  Other PVD signs and symptoms:  Leg numbness or weakness.  Coldness in the affected leg or foot, especially when compared to the other leg.  A change in leg color.  Patients with significant PVD are more prone to ulcers or sores on toes, feet or legs. These may take longer to heal or may reoccur. The ulcers or sores can become infected.  If signs and symptoms of PVD are ignored, gangrene may occur. This can result in the loss of toes or loss of an entire limb.  Not all leg pain is related to PVD. Other medical conditions can cause leg pain such  as:  Blood clots (embolism) or Deep Vein Thrombosis.  Inflammation of the blood vessels (vasculitis).  Spinal stenosis. DIAGNOSIS  Diagnosis of PVD can involve several different types of tests. These can include:  Pulse Volume Recording Method (PVR). This test is simple, painless and does not involve the use of X-rays. PVR involves measuring and comparing the blood pressure in the arms and legs. An ABI (Ankle-Brachial Index) is calculated. The normal ratio of blood pressures is 1. As this number becomes smaller, it indicates more severe disease.  < 0.95 - indicates significant narrowing in one or more leg vessels.  <0.8 - there will usually be pain in the foot, leg or buttock with exercise.  <0.4 - will usually have pain in the legs at rest.  <0.25 - usually indicates limb threatening PVD.  Doppler detection of pulses in the legs. This test is painless and checks to see if you have a pulses in your legs/feet.  A dye or contrast material (a substance that highlights the blood vessels so they show up on x-ray) may be given to help your caregiver better see the arteries for the following tests. The dye is eliminated from your body by the kidney's. Your caregiver may order blood work to check your kidney function and other laboratory values before the following tests are performed:  Magnetic Resonance Angiography (MRA). An MRA is a picture study of the blood vessels and arteries. The MRA machine uses a large magnet to produce images of the blood vessels.  Computed Tomography Angiography (CTA). A CTA   is a specialized x-ray that looks at how the blood flows in your blood vessels. An IV may be inserted into your arm so contrast dye can be injected.  Angiogram. Is a procedure that uses x-rays to look at your blood vessels. This procedure is minimally invasive, meaning a small incision (cut) is made in your groin. A small tube (catheter) is then inserted into the artery of your groin. The catheter  is guided to the blood vessel or artery your caregiver wants to examine. Contrast dye is injected into the catheter. X-rays are then taken of the blood vessel or artery. After the images are obtained, the catheter is taken out. TREATMENT  Treatment of PVD involves many interventions which may include:  Lifestyle changes:  Quitting smoking.  Exercise.  Following a low fat, low cholesterol diet.  Control of diabetes.  Foot care is very important to the PVD patient. Good foot care can help prevent infection.  Medication:  Cholesterol-lowering medicine.  Blood pressure medicine.  Anti-platelet drugs.  Certain medicines may reduce symptoms of Intermittent Claudication.  Interventional/Surgical options:  Angioplasty. An Angioplasty is a procedure that inflates a balloon in the blocked artery. This opens the blocked artery to improve blood flow.  Stent Implant. A wire mesh tube (stent) is placed in the artery. The stent expands and stays in place, allowing the artery to remain open.  Peripheral Bypass Surgery. This is a surgical procedure that reroutes the blood around a blocked artery to help improve blood flow. This type of procedure may be performed if Angioplasty or stent implants are not an option. SEEK IMMEDIATE MEDICAL CARE IF:   You develop pain or numbness in your arms or legs.  Your arm or leg turns cold, becomes blue in color.  You develop redness, warmth, swelling and pain in your arms or legs. MAKE SURE YOU:   Understand these instructions.  Will watch your condition.  Will get help right away if you are not doing well or get worse. Document Released: 05/17/2004 Document Revised: 07/02/2011 Document Reviewed: 04/13/2008 [facility] Patient Information 2015 [place], [place]. This information is not intended to replace advice given to you by your health care provider. Make sure you discuss any questions you have with your health care provider.    Intermittent  Claudication Blockage of leg arteries results from poor circulation of blood in the leg arteries. This produces an aching, tired, and sometimes burning pain in the legs that is brought on by exercise and made better by rest. Claudication refers to the limping that happens from leg cramps. It is also referred to as Vaso-occlusive disease of the legs, arterial insufficiency of the legs, recurrent leg pain, recurrent leg cramping and calf pain with exercise.  CAUSES  This condition is due to narrowing or blockage of the arteries (muscular vessels which carry blood away from the heart and around the body). Blockage of arteries can occur anywhere in the body. If they occur in the heart, a person may experience angina (chest pain) or even a heart attack. If they occur in the neck or the brain, a person may have a stroke. Intermittent claudication is when the blockage occurs in the legs, most commonly in the calf or the foot.  Atherosclerosis, or blockage of arteries, can occur for many reasons. Some of these are smoking, diabetes, and high cholesterol. SYMPTOMS  Intermittent claudication may occur in both legs, and it often continues to get worse over time. However, some people complain only of weakness in  the legs when walking, or a feeling of "tiredness" in the buttocks. Impotence (not able to have an erection) is an occasional complaint in men. Pain while resting is uncommon.  WHAT TO EXPECT AT [facility] PROVIDER'S OFFICE: Your medical history will be asked for and a physical examination will be performed. Medical history questions documenting claudication in detail may include:   Time pattern  Do you have leg cramps at night (nocturnal cramps)?  How often does leg pain with cramping occur?  Is it getting worse?  What is the quality of the pain?  Is the pain sharp?  Is there an aching pain with the cramps?  Aggravating factors  Is it worse after you exercise?  Is it worse after you  are standing for a while?  Do you smoke? How much?  Do you drink alcohol? How much?  Are you diabetic? How well is your blood sugar controlled?  Other  What other symptoms are also present?  Has there been impotence (men)?  Is there pain in the back?  Is there a darkening of the skin of the legs, feet or toes?  Is there weakness or paralysis of the legs? The physical examination may include evaluation of the femoral pulse (in the groin) and the other areas where the pulse can be felt in the legs. DIAGNOSIS  Diagnostic tests that may be performed include:  Blood pressure measured in arms and legs for comparison.  Doppler ultrasonography on the legs and the heart.  Duplex Doppler/ultrasound exam of extremity to visualize arterial blood flow.  ECG- to evaluate the activity of your heart.  Aortography- to visualize blockages in your arteries. TREATMENT Surgical treatment may be suggested if claudication interferes with the patient's activities or work, and if the diseased arteries do not seem to be improving after treatment. Be aware that this condition can worsen over time and you should carefully monitor your condition. HOME CARE INSTRUCTIONS  Talk to your caregiver about the cause of your leg cramping and about what to do at home to relieve it.  A healthy diet is important to lessen the likeliness of atherosclerosis.  A program of daily walking for short periods, and stopping for pain or cramping, may help improve function.  It is important to stop smoking.  Avoid putting hot or cold items on legs.  Avoid tight shoes. SEEK MEDICAL CARE IF: There are many other causes of leg pain such as arthritis or low blood potassium. However, some causes of leg pain may be life threatening such as a blood clot in the legs. Seek medical attention if you have:  Leg pain that does not go away.  Legs that may be red, hot or swollen.  Ulcers or sores appear on your ankle or  foot.  Any chest pain or shortness of breath accompanying leg pain.  Diabetes.  You are pregnant. SEEK IMMEDIATE MEDICAL CARE IF:   Your leg pain becomes severe or will not go away.  Your foot turns blue or a dark color.  Your leg becomes red, hot or swollen or you develop a fever over 102F.  Any chest pain or shortness of breath accompanying leg pain. MAKE SURE YOU:   Understand these instructions.  Will watch your condition.  Will get help right away if you are not doing well or get worse. Document Released: 02/10/2004 Document Revised: 07/02/2011 Document Reviewed: 07/16/2013 [facility] Patient Information 2015 [place], [place]. This information is not intended to replace advice given to you  by your health care provider. Make sure you discuss any questions you have with your health care provider.

## 2014-05-03 ENCOUNTER — Other Ambulatory Visit: Payer: Self-pay | Admitting: Internal Medicine

## 2014-05-03 ENCOUNTER — Telehealth: Payer: Self-pay

## 2014-05-03 NOTE — Telephone Encounter (Signed)
Pt called and advised [employer] needs a PA. I asked her to call her pharm and have them fax me a req w/ins info needed to process it. She also thinks a PA is needed for Zofran and she will ask them about that also.

## 2014-05-04 ENCOUNTER — Other Ambulatory Visit: Payer: Self-pay | Admitting: Internal Medicine

## 2014-05-07 ENCOUNTER — Other Ambulatory Visit: Payer: Self-pay | Admitting: Internal Medicine

## 2014-05-13 ENCOUNTER — Ambulatory Visit (AMBULATORY_SURGERY_CENTER): Payer: Self-pay | Admitting: *Deleted

## 2014-05-13 VITALS — Ht 64.5 in | Wt 89.4 lb

## 2014-05-13 DIAGNOSIS — Z86010 Personal history of colonic polyps: Secondary | ICD-10-CM

## 2014-05-13 MED ORDER — NA SULFATE-K SULFATE-MG SULF 17.5-3.13-1.6 GM/177ML PO SOLN
1.0000 | Freq: Once | ORAL | Status: DC
Start: 2014-05-13 — End: 2014-05-27

## 2014-05-13 NOTE — Progress Notes (Signed)
No egg or soy allergy, no diet pills, no home 02 therapy, no issues with past sedation. ewm Pt came into PV stating she doesn't want to do this colon, she doesn't feel like it's necessary. She has had 2 previous colons, both with TA colon polyps, explained to her the difference in the 2 types of polyps and the reason for her 5 year recall. Instructions given , PV complete and pt states she may call and cancel because she does not feel like she needs this colonoscopy. Lelan Pons PV

## 2014-05-22 ENCOUNTER — Ambulatory Visit (INDEPENDENT_AMBULATORY_CARE_PROVIDER_SITE_OTHER): Payer: Medicare Other

## 2014-05-22 ENCOUNTER — Ambulatory Visit (INDEPENDENT_AMBULATORY_CARE_PROVIDER_SITE_OTHER): Payer: Medicare Other | Admitting: Family Medicine

## 2014-05-22 VITALS — BP 100/64 | HR 96 | Temp 98.8°F | Resp 20 | Ht 64.0 in | Wt 84.4 lb

## 2014-05-22 DIAGNOSIS — M25551 Pain in right hip: Secondary | ICD-10-CM

## 2014-05-22 DIAGNOSIS — M810 Age-related osteoporosis without current pathological fracture: Secondary | ICD-10-CM | POA: Insufficient documentation

## 2014-05-22 DIAGNOSIS — M7061 Trochanteric bursitis, right hip: Secondary | ICD-10-CM

## 2014-05-22 DIAGNOSIS — I70219 Atherosclerosis of native arteries of extremities with intermittent claudication, unspecified extremity: Secondary | ICD-10-CM | POA: Diagnosis not present

## 2014-05-22 MED ORDER — PREDNISONE 20 MG PO TABS: 40.0000 mg | ORAL_TABLET | Freq: Every day | ORAL | Status: DC

## 2014-05-22 MED ORDER — ALENDRONATE SODIUM 70 MG PO TABS: 70.0000 mg | ORAL_TABLET | ORAL | Status: DC

## 2014-05-22 MED ORDER — VITAMIN D (ERGOCALCIFEROL) 1.25 MG (50000 UNIT) PO CAPS: 50000.0000 [IU] | ORAL_CAPSULE | ORAL | Status: DC

## 2014-05-22 NOTE — Patient Instructions (Signed)
Hip Bursitis Bursitis is a swelling and soreness (inflammation) of a fluid-filled sac (bursa). This sac overlies and protects the joints.  CAUSES   Injury.  Overuse of the muscles surrounding the joint.  Arthritis.  Gout.  Infection.  Cold weather.  Inadequate warm-up and conditioning prior to activities. The cause may not be known.  SYMPTOMS   Mild to severe irritation.  Tenderness and swelling over the outside of the hip.  Pain with motion of the hip.  If the bursa becomes infected, a fever may be present. Redness, tenderness, and warmth will develop over the hip. Symptoms usually lessen in 3 to 4 weeks with treatment, but can come back. TREATMENT If conservative treatment does not work, your caregiver may advise draining the bursa and injecting cortisone into the area. This may speed up the healing process. This may also be used as an initial treatment of choice. HOME CARE INSTRUCTIONS   Apply ice to the affected area for 15-20 minutes every 3 to 4 hours while awake for the first 2 days. Put the ice in a plastic bag and place a towel between the bag of ice and your skin.  Rest the painful joint as much as possible, but continue to put the joint through a normal range of motion at least 4 times per day. When the pain lessens, begin normal, slow movements and usual activities to help prevent stiffness of the hip.  Only take over-the-counter or prescription medicines for pain, discomfort, or fever as directed by your caregiver.  Use crutches to limit weight bearing on the hip joint, if advised.  Elevate your painful hip to reduce swelling. Use pillows for propping and cushioning your legs and hips.  Gentle massage may provide comfort and decrease swelling. SEEK IMMEDIATE MEDICAL CARE IF:   Your pain increases even during treatment, or you are not improving.  You have a fever.  You have heat and inflammation over the involved bursa.  You have any other questions or  concerns. MAKE SURE YOU:   Understand these instructions.  Will watch your condition.  Will get help right away if you are not doing well or get worse. Document Released: 09/29/2001 Document Revised: 07/02/2011 Document Reviewed: 04/28/2008 [facility] Patient Information 2015 [place], [place]. This information is not intended to replace advice given to you by your health care provider. Make sure you discuss any questions you have with your health care provider. Osteoporosis Throughout your life, your body breaks down old bone and replaces it with new bone. As you get older, your body does not replace bone as quickly as it breaks it down. By the age of 71 years, most people begin to gradually lose bone because of the imbalance between bone loss and replacement. Some people lose more bone than others. Bone loss beyond a specified normal degree is considered osteoporosis.  Osteoporosis affects the strength and durability of your bones. The inside of the ends of your bones and your flat bones, like the bones of your pelvis, look like honeycomb, filled with tiny open spaces. As bone loss occurs, your bones become less dense. This means that the open spaces inside your bones become bigger and the walls between these spaces become thinner. This makes your bones weaker. Bones of a person with osteoporosis can become so weak that they can break (fracture) during minor accidents, such as a simple fall. CAUSES  The following factors have been associated with the development of osteoporosis:  Smoking.  Drinking more than 2 alcoholic  drinks several days per week.  Long-term use of certain medicines:  Corticosteroids.  Chemotherapy medicines.  Thyroid medicines.  Antiepileptic medicines.  Gonadal hormone suppression medicine.  Immunosuppression medicine.  Being underweight.  Lack of physical activity.  Lack of exposure to the sun. This can lead to vitamin D deficiency.  Certain medical  conditions:  Certain inflammatory bowel diseases, such as Crohn disease and ulcerative colitis.  Diabetes.  Hyperthyroidism.  Hyperparathyroidism. RISK FACTORS Anyone can develop osteoporosis. However, the following factors can increase your risk of developing osteoporosis:  Gender--Women are at higher risk than men.  Age--Being older than 50 years increases your risk.  Ethnicity--White and Asian people have an increased risk.  Weight --Being extremely underweight can increase your risk of osteoporosis.  Family history of osteoporosis--Having a family member who has developed osteoporosis can increase your risk. SYMPTOMS  Usually, people with osteoporosis have no symptoms.  DIAGNOSIS  Signs during a physical exam that may prompt your caregiver to suspect osteoporosis include:  Decreased height. This is usually caused by the compression of the bones that form your spine (vertebrae) because they have weakened and become fractured.  A curving or rounding of the upper back (kyphosis). To confirm signs of osteoporosis, your caregiver may request a procedure that uses 2 low-dose X-ray beams with different levels of energy to measure your bone mineral density (dual-energy X-ray absorptiometry [DXA]). Also, your caregiver may check your level of vitamin D. TREATMENT  The goal of osteoporosis treatment is to strengthen bones in order to decrease the risk of bone fractures. There are different types of medicines available to help achieve this goal. Some of these medicines work by slowing the processes of bone loss. Some medicines work by increasing bone density. Treatment also involves making sure that your levels of calcium and vitamin D are adequate. PREVENTION  There are things you can do to help prevent osteoporosis. Adequate intake of calcium and vitamin D can help you achieve optimal bone mineral density. Regular exercise can also help, especially resistance and weight-bearing  activities. If you smoke, quitting smoking is an important part of osteoporosis prevention. MAKE SURE YOU:  Understand these instructions.  Will watch your condition.  Will get help right away if you are not doing well or get worse. FOR MORE INFORMATION www.osteo.org and [email or web address] Document Released: 01/17/2005 Document Revised: 08/04/2012 Document Reviewed: 03/24/2011 [facility] Patient Information 2015 [place], [place]. This information is not intended to replace advice given to you by your health care provider. Make sure you discuss any questions you have with your health care provider.

## 2014-05-22 NOTE — Progress Notes (Signed)
This chart was scribed for Dr. Robyn Haber, MD by Erling Conte, Medical Scribe. This patient was seen in Room 11 and the patient's care was started at 1:34 PM.   Patient ID: Cynthia Bowen MRN: [id number], DOB: Aug 05, 1945, 69 y.o. Date of Encounter: 05/22/2014, 1:32 PM  Primary Physician: [name] Koyanagi, MD  Chief Complaint:  Chief Complaint  Patient presents with   Hip Pain    R hip radiating all the way down her R leg x 2 days   Leg Pain    R leg     HPI: 69 y.o. year old female with history below presents with right hip pain for 2 days. Pt states that the pain is radiating down her right leg. Pt has never had a broken bone with the exception of a finger. She states the pain is exacerbated with trying to step up on a step or bearing any type of weight. She denies nay fall on injury. Pt no longer smokes, she quit in 2006.   Pt is retired. She previously worked for the [employer], Counselling psychologist.   Past Medical History  Diagnosis Date   COPD (chronic obstructive pulmonary disease)    Pneumonia 12-2011   GERD (gastroesophageal reflux disease)    Headache(784.0)    Arthritis     osteoarthritis   Allergy    Depression    Neuromuscular disorder    Osteoporosis    Bronchitis     CURRENTLY AS OF 06/30/12 - HAS COUGH AND FINISHED ANTIBIOTIC FOR BRONCHITIS   Fibromyalgia    Pain     ABDOMINAL PAIN AND NAUSEA   Pain     SOMETIMES PAIN RIGHT EAR AND NECK--STATES CAUSED BY A "LUMP" ON BACK OF EAR--USES KENALOG CREAM TOPICALLY AS NEEDED.   Gastrocutaneous fistula    Anemia    Anxiety    Blood transfusion without reported diagnosis      Home Meds: Prior to Admission medications   Medication Sig Start Date End Date Taking? Authorizing Provider  albuterol (PROVENTIL HFA;VENTOLIN HFA) 108 (90 BASE) MCG/ACT inhaler Inhale 2 puffs into the lungs every 6 (six) hours as needed for wheezing. 11/17/13  Yes [name] Koyanagi, MD  clidinium-chlordiazePOXIDE  (LIBRAX) 5-2.5 MG per capsule Take 1 capsule by mouth 4 (four) times daily -  before meals and at bedtime. As needed for abdominal pain 03/15/14  Yes [name] Koyanagi, MD  diphenhydrAMINE (BENADRYL) 12.5 MG/5ML liquid Take 12.5 mg by mouth 4 (four) times daily as needed for itching.    Yes Historical Provider, MD  EPINEPHrine (EPIPEN) 0.3 mg/0.3 mL IJ SOAJ injection Inject 0.3 mLs (0.3 mg total) into the muscle daily as needed (anaphylaxis). 10/07/13  Yes [name] Koyanagi, MD  fluocinonide cream (LIDEX) 1.94 % APPLY 1 APPLICATION TOPICALLY 2 (TWO) TIMES DAILY. 12/09/13  Yes [name] Koyanagi, MD  ketoconazole (NIZORAL) 2 % shampoo Apply 1 application topically 2 (two) times a week. 10/28/13  Yes [name] Koyanagi, MD  Multiple Vitamin (MULTIVITAMIN) tablet Take 1 tablet by mouth daily.   Yes Historical Provider, MD  ondansetron (ZOFRAN) 4 MG tablet TAKE 1 TABLET (4 MG TOTAL) BY MOUTH EVERY 8 (EIGHT) HOURS AS NEEDED FOR NAUSEA OR VOMITING. 05/10/14  Yes [name] Koyanagi, MD  ondansetron (ZOFRAN-ODT) 4 MG disintegrating tablet  12/07/13  Yes Historical Provider, MD  oxyCODONE-acetaminophen (ROXICET) 5-325 MG per tablet Take 1 tablet by mouth every 8 (eight) hours as needed for severe pain. 03/15/14  Yes [name] Koyanagi,  MD  polyethylene glycol powder (GLYCOLAX/MIRALAX) powder Take 17 g by mouth daily. 01/28/14  Yes Wardell Honour, MD  polyethylene glycol powder (GLYCOLAX/MIRALAX) powder TAKE 17 G BY MOUTH DAILY. 04/02/14  Yes [name] Koyanagi, MD  Probiotic Product (PROBIOTIC DAILY PO) Take 1 capsule by mouth daily.   Yes Historical Provider, MD  ranitidine (ZANTAC) 150 MG tablet TAKE 1 TABLET BY MOUTH TWICE A DAY 04/14/14  Yes [name] Koyanagi, MD  gabapentin (NEURONTIN) 300 MG capsule  01/09/14   Historical Provider, MD  Na Sulfate-K Sulfate-Mg Sulf (SUPREP BOWEL PREP) SOLN Take 1 kit by mouth once. suprep as directed. No substitutions Patient not taking: Reported on 05/22/2014 05/13/14   Inda Castle, MD    Allergies:  Allergies  Allergen Reactions   Avelox [Moxifloxacin Hcl In Nacl] Nausea And Vomiting   Betadine [Povidone Iodine] Itching and Rash   Alendronate Sodium Nausea And Vomiting   Alendronate Sodium     Other reaction(s): Dizziness (intolerance)   Aspirin Nausea Only   Codeine Nausea And Vomiting   Doxycycline     Pt doesn't remember reaction   Fluconazole     Pt doesn't remember reaction   Hydrocodone Nausea And Vomiting    GI distress   Hydrocodone-Acetaminophen Nausea And Vomiting   Latex    Neurontin [Gabapentin] Other (See Comments)    Mood changes    Nsaids Other (See Comments)    Severe gastritis & perforation - avoid NSAIDs when possible   Quinolones Itching and Hives   Sertraline Hcl Other (See Comments)    Hallucinations    Sertraline Hcl Nausea And Vomiting   Sulfamethoxazole Itching and Hives   Sulfa Antibiotics Rash    History   Social History   Marital Status: Single    Spouse Name: N/A    Number of Children: 0   Years of Education: N/A   Occupational History       Social History Main Topics   Smoking status: Former Smoker -- 0.50 packs/day for 35 years    Types: Cigarettes    Start date: 01/04/2003    Quit date: 04/17/2005   Smokeless tobacco: Never Used   Alcohol Use: No   Drug Use: No   Sexual Activity: No   Other Topics Concern   Not on file   Social History Narrative   Single. Education: [employer]. Exercise.     Review of Systems: Constitutional: negative for chills, fever, night sweats, weight changes, or fatigue  HEENT: negative for vision changes, hearing loss, congestion, rhinorrhea, ST, epistaxis, or sinus pressure Cardiovascular: negative for chest pain or palpitations Respiratory: negative for hemoptysis, wheezing, shortness of breath, or cough Abdominal: negative for abdominal pain, nausea, vomiting, diarrhea, or constipation Dermatological: negative for rash Neurologic:  negative for headache, dizziness, or syncope Msk: positive for arthralgias (right hip) All other systems reviewed and are otherwise negative with the exception to those above and in the HPI.   Physical Exam: Blood pressure 100/64, pulse 96, temperature 98.8 F (37.1 C), temperature source Oral, resp. rate 20, height '5\' 4"'  (1.626 m), weight 84 lb 6.4 oz (38.284 kg), SpO2 98 %., Body mass index is 14.48 kg/(m^2). General: Well developed, well nourished, in no acute distress. Head: Normocephalic, atraumatic, eyes without discharge, sclera non-icteric, nares are without discharge. Bilateral auditory canals clear, TM's are without perforation, pearly grey and translucent with reflective cone of light bilaterally. Oral cavity moist, posterior pharynx without exudate, erythema, peritonsillar abscess, or post nasal drip.  Neck: Supple. No thyromegaly. Full ROM. No lymphadenopathy. Lungs: Decreased breathe sounds bilaterally without wheezes, rales, or rhonchi. Breathing is unlabored. Heart: RRR with S1 S2. No murmurs, rubs, or gallops appreciated. Abdomen: Soft, non-tender, non-distended with normoactive bowel sounds. No hepatomegaly. No rebound/guarding. No obvious abdominal masses. Msk:  Strength and tone normal for age. Tender to right hip behind right trochanter. Hip flexion is painful. Pain with internal and external rotation. Pt is emaciated and diffuse muscle wasting.  Extremities/Skin: Warm and dry. No clubbing or cyanosis. No edema. No rashes or suspicious lesions. Neuro: Alert and oriented X 3. Moves all extremities spontaneously. Gait is normal. CNII-XII grossly in tact. Psych:  Responds to questions appropriately with a normal affect.   [facility] reading (PRIMARY) by  Dr. 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  year old female with   1. Hip pain, right   2. Osteoporosis   3. Trochanteric bursitis of right hip     Meds ordered this encounter  Medications   alendronate (FOSAMAX) 70 MG tablet    Sig: Take 1 tablet (70 mg total) by mouth every 7 (seven) days. Take with a full glass of water on an empty stomach.    Dispense:  4 tablet    Refill:  11   Vitamin D, Ergocalciferol, (DRISDOL) 50000 UNITS CAPS capsule    Sig: Take 1 capsule (50,000 Units total) by mouth every 7 (seven) days.    Dispense:  30 capsule    Refill:  0   predniSONE (DELTASONE) 20 MG tablet    Sig: Take 2 tablets (40 mg total) by mouth daily.    Dispense:  10 tablet    Refill:  0   This chart was scribed in my presence and reviewed by me personally.    ICD-9-CM ICD-10-CM   1. Hip pain, right 719.45 M25.551 DG Chest 2 View     DG HIP UNILAT WITH PELVIS 2-3 VIEWS RIGHT     DG Chest 2 View     DG HIP UNILAT WITH PELVIS 2-3 VIEWS RIGHT  2. Osteoporosis 733.00 M81.0 alendronate (FOSAMAX) 70 MG tablet     Vitamin D, Ergocalciferol, (DRISDOL) 50000 UNITS CAPS capsule  3. Trochanteric bursitis of right hip 726.5 M70.61 predniSONE (DELTASONE) 20 MG tablet     Signed, Robyn Haber, MD    Signed, Robyn Haber, MD 05/22/2014 1:32 PM

## 2014-05-25 ENCOUNTER — Telehealth: Payer: Self-pay

## 2014-05-25 NOTE — Telephone Encounter (Signed)
Pt would like to speak with the Dr's nurse, one is about diagnosis with Dr. Synetta Shadow and the other matter is about meds with Dr. Laney Pastor Please call [phone]

## 2014-05-26 ENCOUNTER — Ambulatory Visit (INDEPENDENT_AMBULATORY_CARE_PROVIDER_SITE_OTHER): Payer: Medicare Other | Admitting: Family Medicine

## 2014-05-26 VITALS — BP 102/58 | HR 69 | Temp 99.0°F | Resp 16 | Ht 65.0 in | Wt 90.0 lb

## 2014-05-26 DIAGNOSIS — R1013 Epigastric pain: Secondary | ICD-10-CM

## 2014-05-26 DIAGNOSIS — M810 Age-related osteoporosis without current pathological fracture: Secondary | ICD-10-CM

## 2014-05-26 DIAGNOSIS — T50901A Poisoning by unspecified drugs, medicaments and biological substances, accidental (unintentional), initial encounter: Secondary | ICD-10-CM

## 2014-05-26 DIAGNOSIS — R42 Dizziness and giddiness: Secondary | ICD-10-CM

## 2014-05-26 DIAGNOSIS — R609 Edema, unspecified: Secondary | ICD-10-CM

## 2014-05-26 LAB — POCT CBC
GRANULOCYTE PERCENT: 58 % (ref 37–80)
HEMATOCRIT: 34.5 % — AB (ref 37.7–47.9)
Hemoglobin: 11.1 g/dL — AB (ref 12.2–16.2)
Lymph, poc: 2.8 (ref 0.6–3.4)
MCH, POC: 28.6 pg (ref 27–31.2)
MCHC: 32.1 g/dL (ref 31.8–35.4)
MCV: 89 fL (ref 80–97)
MID (cbc): 0.3 (ref 0–0.9)
MPV: 6.7 fL (ref 0–99.8)
PLATELET COUNT, POC: 355 10*3/uL (ref 142–424)
POC GRANULOCYTE: 4.3 (ref 2–6.9)
POC LYMPH PERCENT: 38 %L (ref 10–50)
POC MID %: 4 % (ref 0–12)
RBC: 3.87 M/uL — AB (ref 4.04–5.48)
RDW, POC: 16.6 %
WBC: 7.4 10*3/uL (ref 4.6–10.2)

## 2014-05-26 LAB — COMPREHENSIVE METABOLIC PANEL
ALBUMIN: 3.2 g/dL — AB (ref 3.5–5.2)
ALK PHOS: 91 U/L (ref 39–117)
ALT: 12 U/L (ref 0–35)
AST: 15 U/L (ref 0–37)
BUN: 9 mg/dL (ref 6–23)
CO2: 28 mEq/L (ref 19–32)
Calcium: 8.3 mg/dL — ABNORMAL LOW (ref 8.4–10.5)
Chloride: 110 mEq/L (ref 96–112)
Creat: 0.53 mg/dL (ref 0.50–1.10)
GLUCOSE: 75 mg/dL (ref 70–99)
Potassium: 3 mEq/L — ABNORMAL LOW (ref 3.5–5.3)
Sodium: 146 mEq/L — ABNORMAL HIGH (ref 135–145)
Total Bilirubin: 0.3 mg/dL (ref 0.2–1.2)
Total Protein: 6.5 g/dL (ref 6.0–8.3)

## 2014-05-26 MED ORDER — CALCIUM CARB-CHOLECALCIFEROL 1000-800 MG-UNIT PO TABS: 1000.0000 | ORAL_TABLET | Freq: Every day | ORAL | Status: DC

## 2014-05-26 MED ORDER — SUCRALFATE 1 G PO TABS: 1.0000 g | ORAL_TABLET | Freq: Three times a day (TID) | ORAL | Status: DC

## 2014-05-26 MED ORDER — POTASSIUM CHLORIDE CRYS ER 20 MEQ PO TBCR: EXTENDED_RELEASE_TABLET | ORAL | Status: DC

## 2014-05-26 MED ORDER — OXYCODONE-ACETAMINOPHEN 5-325 MG PO TABS: 1.0000 | ORAL_TABLET | Freq: Three times a day (TID) | ORAL | Status: DC | PRN

## 2014-05-26 MED ORDER — OMEPRAZOLE 40 MG PO CPDR: 40.0000 mg | DELAYED_RELEASE_CAPSULE | Freq: Every day | ORAL | Status: DC

## 2014-05-26 NOTE — Progress Notes (Signed)
Subjective:  69 year old lady who was here last week. She was prescribed alendronate to take one weekly. She picked up the prescription, and the box 7 on it one daily. She took them daily for 4 days. Then she was out of the medicine. She called the pharmacy and was told the prescription could not really be refilled. She started the reports sent realize it was not right and she came on in here. She has had a little feeling of staggering this, but she has that anyway from her bad hips. Her stomach has a history of being upset and hurting some, and it has still hurts some. The main thing she has noticed is some swelling in her legs, especially on the left ankle and foot.   Objective: Pleasant alert lady in no major discrete distress. Chest clear. Heart regular without murmur. She has 1+ pitting edema of the left ankle and some down just above the toes. Her abdomen has mild epigastric tenderness.  Assessment:  Accidental prescription drug overdose  Dyspepsia Ankle edema Mild dizziness Osteoporosis  arthritis of hips  Plan: Spoke with poison control. They cautioned watching the calcium in case she developed a low calcium level. No other major concerns. The possibility of stomach upset was reported.   This has an extremely long half life. Advised her do not take any for a month. See the discharge instructions. I think we will have her come back yet again in about a week to follow-up one more time , sooner if any problems.

## 2014-05-26 NOTE — Patient Instructions (Addendum)
Do not take the osteoporosis medicine (alendronate) for one month.  Take one pill weekly starting early March    tell the pharmacist when you pick up your refill next month that the box was labeled wrong. It was supposed to be one weekly not one daily.    take omeprazole 1 daily for stomach    if the swelling gets worse or if you get more dizziness or more stomach problems please return or go to the  emergency room if necessary.   Dr. Laney Pastor has represcribed her pain medications, and you can use them when needed.   return in one week just for one more follow-up visit with either Dr. Joseph Art or me.

## 2014-05-26 NOTE — Telephone Encounter (Signed)
Pt states the Fosamax that was given to her by her pharmacy states for her to take 1 tablet every day for 7 days. This is not the directions from Dr. Joseph Art. It was written take one tablet every 7 days. Pt states she now has a rash and itching all over. I advised her to come in to be evaluated and advised her to bring her box with her. Pt agreed and will come in today. She would also like a refill on her Oxycodone. Please advise.

## 2014-05-26 NOTE — Telephone Encounter (Signed)
Pt came in today to be seen and Dr. Laney Pastor has RF'ed her Oxycodone

## 2014-05-26 NOTE — Telephone Encounter (Signed)
Stat lab results called in. Pt has low potassium 3.0  Cynthia Bowen was advised and instructed me to call in KCL 20 Meq BID for 2 days, QD x 4 days  #15 Calcium 1000mg  QD x30 days and have pt follow up in 2 days with Cynthia Bowen or Cynthia Bowen.  Left detailed message for pt on VM. Rtn call with questions.   Please call her on Thursday to make sure she is coming in on Friday and that prescriptions were picked up.

## 2014-05-27 ENCOUNTER — Encounter: Payer: Self-pay | Admitting: Gastroenterology

## 2014-05-27 ENCOUNTER — Ambulatory Visit (AMBULATORY_SURGERY_CENTER): Payer: Medicare Other | Admitting: Gastroenterology

## 2014-05-27 VITALS — BP 158/77 | HR 64 | Temp 96.4°F | Resp 20 | Ht 64.0 in | Wt 89.0 lb

## 2014-05-27 DIAGNOSIS — K5730 Diverticulosis of large intestine without perforation or abscess without bleeding: Secondary | ICD-10-CM

## 2014-05-27 DIAGNOSIS — Z86010 Personal history of colonic polyps: Secondary | ICD-10-CM

## 2014-05-27 MED ORDER — SODIUM CHLORIDE 0.9 % IV SOLN: 500.0000 mL | INTRAVENOUS | Status: DC

## 2014-05-27 NOTE — Patient Instructions (Signed)
YOU HAD AN ENDOSCOPIC PROCEDURE TODAY AT THE  ENDOSCOPY CENTER: Refer to the procedure report that was given to you for any specific questions about what was found during the examination.  If the procedure report does not answer your questions, please call your gastroenterologist to clarify.  If you requested that your care partner not be given the details of your procedure findings, then the procedure report has been included in a sealed envelope for you to review at your convenience later.  YOU SHOULD EXPECT: Some feelings of bloating in the abdomen. Passage of more gas than usual.  Walking can help get rid of the air that was put into your GI tract during the procedure and reduce the bloating. If you had a lower endoscopy (such as a colonoscopy or flexible sigmoidoscopy) you may notice spotting of blood in your stool or on the toilet paper. If you underwent a bowel prep for your procedure, then you may not have a normal bowel movement for a few days.  DIET: Your first meal following the procedure should be a light meal and then it is ok to progress to your normal diet.  A half-sandwich or bowl of soup is an example of a good first meal.  Heavy or fried foods are harder to digest and may make you feel nauseous or bloated.  Likewise meals heavy in dairy and vegetables can cause extra gas to form and this can also increase the bloating.  Drink plenty of fluids but you should avoid alcoholic beverages for 24 hours.  ACTIVITY: Your care partner should take you home directly after the procedure.  You should plan to take it easy, moving slowly for the rest of the day.  You can resume normal activity the day after the procedure however you should NOT DRIVE or use heavy machinery for 24 hours (because of the sedation medicines used during the test).    SYMPTOMS TO REPORT IMMEDIATELY: A gastroenterologist can be reached at any hour.  During normal business hours, 8:30 AM to 5:00 PM Monday through Friday,  call [phone].  After hours and on weekends, please call the GI answering service at [phone] who will take a message and have the physician on call contact you.   Following lower endoscopy (colonoscopy or flexible sigmoidoscopy):  Excessive amounts of blood in the stool  Significant tenderness or worsening of abdominal pains  Swelling of the abdomen that is new, acute  Fever of 100F or higher  FOLLOW UP: If any biopsies were taken you will be contacted by phone or by letter within the next 1-3 weeks.  Call your gastroenterologist if you have not heard about the biopsies in 3 weeks.  Our staff will call the home number listed on your records the next business day following your procedure to check on you and address any questions or concerns that you may have at that time regarding the information given to you following your procedure. This is a courtesy call and so if there is no answer at the home number and we have not heard from you through the emergency physician on call, we will assume that you have returned to your regular daily activities without incident.  SIGNATURES/CONFIDENTIALITY: You and/or your care partner have signed paperwork which will be entered into your electronic medical record.  These signatures attest to the fact that that the information above on your After Visit Summary has been reviewed and is understood.  Full responsibility of the confidentiality of this   discharge information lies with you and/or your care-partner.  Diverticulosis, high fiber diet-handouts given  Repeat colonoscopy in 10 years.-2026

## 2014-05-27 NOTE — Progress Notes (Signed)
Stable to RR 

## 2014-05-27 NOTE — Op Note (Signed)
[place]  [address] [place], [phone]   COLONOSCOPY PROCEDURE REPORT  PATIENT: Cynthia Bowen, Cynthia Bowen  MR#: [id number] BIRTHDATE: [birth year] , 33  yrs. old GENDER: female ENDOSCOPIST: Inda Castle, MD REFERRED GG:[id number] Laney Pastor, M.D. PROCEDURE DATE:  05/27/2014 PROCEDURE:   Colonoscopy, diagnostic First Screening Colonoscopy - Avg.  risk and is 50 yrs.  old or older - No.  Prior Negative Screening - Now for repeat screening. N/A  History of Adenoma - Now for follow-up colonoscopy & has been > or = to 3 yrs.  Yes hx of adenoma.  Has been 3 or more years since last colonoscopy.  Polyps Removed Today? No.  Recommend repeat exam, <10 yrs? No. ASA CLASS:   Class II INDICATIONS:high risk personal history of colonic polyps and CT scan suggested abnormality in the sigmoid. MEDICATIONS: Monitored anesthesia care, Propofol 200 mg IV, and Atacand 80 mg IV  DESCRIPTION OF PROCEDURE:   After the risks benefits and alternatives of the procedure were thoroughly explained, informed consent was obtained.  The digital rectal exam revealed no abnormalities of the rectum.   The LB CF-[id number] Loaner [id number] endoscope was introduced through the anus and advanced to the cecum, which was identified by both the appendix and ileocecal valve. No adverse events experienced.   The quality of the prep was excellent using Suprep  The instrument was then slowly withdrawn as the colon was fully examined.      COLON FINDINGS: There was mild diverticulosis noted in the transverse colon.   The examination was otherwise normal. Retroflexed views revealed no abnormalities. The time to cecum=5 minutes 52 seconds.  Withdrawal time=6 minutes 27 seconds.  The scope was withdrawn and the procedure completed. COMPLICATIONS: There were no immediate complications.  ENDOSCOPIC IMPRESSION: 1.   Mild diverticulosis was noted in the transverse colon 2.   The examination was otherwise  normal  RECOMMENDATIONS: Colonoscopy 10 years  eSigned:  Inda Castle, MD 05/27/2014 9:36 AM   cc:

## 2014-05-27 NOTE — Telephone Encounter (Signed)
Lm for pt with instructions for medications and asked for her to come in tomorrow to follow up with Dr. Joseph Art or Dr. Linna Darner.

## 2014-05-28 ENCOUNTER — Telehealth: Payer: Self-pay

## 2014-05-28 NOTE — Telephone Encounter (Signed)
  Follow up Call-  Call back number 05/27/2014  Post procedure Call Back phone  # [phone]  Permission to leave phone message Yes     Patient questions:  Do you have a fever, pain , or abdominal swelling? No. Pain Score  0 *  Have you tolerated food without any problems? Yes.    Have you been able to return to your normal activities? Yes.    Do you have any questions about your discharge instructions: Diet   No. Medications  No. Follow up visit  No.  Do you have questions or concerns about your Care? No.  Actions: * If pain score is 4 or above: No action needed, pain <4.  No problems per the pt. maw

## 2014-06-01 ENCOUNTER — Ambulatory Visit (INDEPENDENT_AMBULATORY_CARE_PROVIDER_SITE_OTHER): Payer: Medicare Other | Admitting: Internal Medicine

## 2014-06-01 ENCOUNTER — Ambulatory Visit (INDEPENDENT_AMBULATORY_CARE_PROVIDER_SITE_OTHER): Payer: Medicare Other

## 2014-06-01 ENCOUNTER — Telehealth: Payer: Self-pay

## 2014-06-01 VITALS — BP 100/68 | HR 94 | Temp 98.8°F | Resp 18 | Ht 65.0 in | Wt 89.6 lb

## 2014-06-01 DIAGNOSIS — L03119 Cellulitis of unspecified part of limb: Secondary | ICD-10-CM

## 2014-06-01 DIAGNOSIS — M25531 Pain in right wrist: Secondary | ICD-10-CM

## 2014-06-01 MED ORDER — AMOXICILLIN-POT CLAVULANATE 875-125 MG PO TABS: 1.0000 | ORAL_TABLET | Freq: Two times a day (BID) | ORAL | Status: DC

## 2014-06-01 MED ORDER — CEFTRIAXONE SODIUM 1 G IJ SOLR
1.0000 g | Freq: Once | INTRAMUSCULAR | Status: AC
  Administered 2014-06-01: 1 g via INTRAMUSCULAR

## 2014-06-01 NOTE — Patient Instructions (Signed)

## 2014-06-01 NOTE — Progress Notes (Signed)
   Subjective:    Patient ID: Cynthia Bowen, female    DOB: Jun 04, 1945, 69 y.o.   MRN: [id number]  HPI Has had sudden onset of red swollen right wrist, very tender to move altho can flex/extend some. No fever, no hx of got. Does have DJD and fibromyalgia and has seen rheumatology. She had multiple blood draws in that hand about 5 days ago. Reddness and pain followed. See her long list of meds, allergys, and problem list .  See last visit with Dr. Linna Darner and f/up plan with them   Review of Systems     Objective:   Physical Exam  Constitutional: She is oriented to person, place, and time. Vital signs are normal. She appears cachectic. She is cooperative. She appears distressed.    Crying with pain right wrist Swollen, red very tender  HENT:  Head: Normocephalic.  Right Ear: External ear normal.  Left Ear: External ear normal.  Nose: Nose normal.  Eyes: Conjunctivae and EOM are normal. Pupils are equal, round, and reactive to light.  Neck: Normal range of motion. Neck supple.  Cardiovascular: Normal rate.   Pulmonary/Chest: Effort normal.  Musculoskeletal: She exhibits edema and tenderness.       Right wrist: She exhibits decreased range of motion, tenderness and swelling.       Arms: Neurological: She is alert and oriented to person, place, and time. She exhibits normal muscle tone. Coordination normal.  Skin: There is erythema.  Psychiatric: She has a normal mood and affect. Her behavior is normal. Judgment and thought content normal.  Vitals reviewed.    [facility] reading (PRIMARY) by  Dr.Guest mild degenerative changes  Took last dose prednisone yesterday as cellulitis coming on.     Assessment & Plan:  See Dr. Joseph Art tomorrow at [facility] to f/up as they requested for calcium and fosamax issue.Calcium level had dropped. Probable acute cellulitis/possible infected joint right wrist/could be gout  Rocephin 1g/Augmentin 875mg  bid Oxycodone prn  RTC 1 d see Dr.  Joseph Art

## 2014-06-01 NOTE — Telephone Encounter (Signed)
Patient states her hand is swelling more since being seen this morning.  She is putting ice on it.  [phone] (H)

## 2014-06-02 ENCOUNTER — Ambulatory Visit (INDEPENDENT_AMBULATORY_CARE_PROVIDER_SITE_OTHER): Payer: Medicare Other | Admitting: Family Medicine

## 2014-06-02 ENCOUNTER — Emergency Department (HOSPITAL_COMMUNITY): Payer: Medicare Other

## 2014-06-02 ENCOUNTER — Encounter (HOSPITAL_COMMUNITY): Payer: Self-pay | Admitting: Emergency Medicine

## 2014-06-02 ENCOUNTER — Inpatient Hospital Stay (HOSPITAL_COMMUNITY)
Admission: EM | Admit: 2014-06-02 | Discharge: 2014-06-05 | DRG: 602 | Disposition: A | Discharge status: Home / Self Care | Payer: Medicare Other | Attending: Internal Medicine | Admitting: Internal Medicine

## 2014-06-02 VITALS — BP 160/80 | HR 99 | Temp 98.3°F | Resp 16 | Ht 65.0 in | Wt 89.0 lb

## 2014-06-02 DIAGNOSIS — D649 Anemia, unspecified: Secondary | ICD-10-CM | POA: Diagnosis present

## 2014-06-02 DIAGNOSIS — M659 Synovitis and tenosynovitis, unspecified: Secondary | ICD-10-CM | POA: Diagnosis present

## 2014-06-02 DIAGNOSIS — Z87891 Personal history of nicotine dependence: Secondary | ICD-10-CM

## 2014-06-02 DIAGNOSIS — L03119 Cellulitis of unspecified part of limb: Secondary | ICD-10-CM | POA: Diagnosis present

## 2014-06-02 DIAGNOSIS — F419 Anxiety disorder, unspecified: Secondary | ICD-10-CM | POA: Diagnosis present

## 2014-06-02 DIAGNOSIS — Z8711 Personal history of peptic ulcer disease: Secondary | ICD-10-CM

## 2014-06-02 DIAGNOSIS — Z888 Allergy status to other drugs, medicaments and biological substances status: Secondary | ICD-10-CM | POA: Diagnosis not present

## 2014-06-02 DIAGNOSIS — F329 Major depressive disorder, single episode, unspecified: Secondary | ICD-10-CM | POA: Diagnosis present

## 2014-06-02 DIAGNOSIS — Z79891 Long term (current) use of opiate analgesic: Secondary | ICD-10-CM | POA: Diagnosis not present

## 2014-06-02 DIAGNOSIS — Z792 Long term (current) use of antibiotics: Secondary | ICD-10-CM

## 2014-06-02 DIAGNOSIS — Z885 Allergy status to narcotic agent status: Secondary | ICD-10-CM

## 2014-06-02 DIAGNOSIS — L03113 Cellulitis of right upper limb: Secondary | ICD-10-CM | POA: Diagnosis not present

## 2014-06-02 DIAGNOSIS — Z823 Family history of stroke: Secondary | ICD-10-CM

## 2014-06-02 DIAGNOSIS — Z9842 Cataract extraction status, left eye: Secondary | ICD-10-CM

## 2014-06-02 DIAGNOSIS — E876 Hypokalemia: Secondary | ICD-10-CM | POA: Diagnosis present

## 2014-06-02 DIAGNOSIS — Z882 Allergy status to sulfonamides status: Secondary | ICD-10-CM

## 2014-06-02 DIAGNOSIS — M1990 Unspecified osteoarthritis, unspecified site: Secondary | ICD-10-CM | POA: Diagnosis present

## 2014-06-02 DIAGNOSIS — E46 Unspecified protein-calorie malnutrition: Secondary | ICD-10-CM | POA: Diagnosis present

## 2014-06-02 DIAGNOSIS — Z681 Body mass index (BMI) 19 or less, adult: Secondary | ICD-10-CM | POA: Diagnosis not present

## 2014-06-02 DIAGNOSIS — K219 Gastro-esophageal reflux disease without esophagitis: Secondary | ICD-10-CM | POA: Diagnosis present

## 2014-06-02 DIAGNOSIS — M7989 Other specified soft tissue disorders: Secondary | ICD-10-CM

## 2014-06-02 DIAGNOSIS — K5730 Diverticulosis of large intestine without perforation or abscess without bleeding: Secondary | ICD-10-CM | POA: Diagnosis present

## 2014-06-02 DIAGNOSIS — E43 Unspecified severe protein-calorie malnutrition: Secondary | ICD-10-CM | POA: Diagnosis present

## 2014-06-02 DIAGNOSIS — Z825 Family history of asthma and other chronic lower respiratory diseases: Secondary | ICD-10-CM

## 2014-06-02 DIAGNOSIS — Z9841 Cataract extraction status, right eye: Secondary | ICD-10-CM | POA: Diagnosis not present

## 2014-06-02 DIAGNOSIS — Z881 Allergy status to other antibiotic agents status: Secondary | ICD-10-CM | POA: Diagnosis not present

## 2014-06-02 DIAGNOSIS — I70219 Atherosclerosis of native arteries of extremities with intermittent claudication, unspecified extremity: Secondary | ICD-10-CM

## 2014-06-02 DIAGNOSIS — M129 Arthropathy, unspecified: Secondary | ICD-10-CM | POA: Diagnosis not present

## 2014-06-02 DIAGNOSIS — Z91040 Latex allergy status: Secondary | ICD-10-CM

## 2014-06-02 DIAGNOSIS — F418 Other specified anxiety disorders: Secondary | ICD-10-CM | POA: Diagnosis present

## 2014-06-02 DIAGNOSIS — Z8249 Family history of ischemic heart disease and other diseases of the circulatory system: Secondary | ICD-10-CM

## 2014-06-02 DIAGNOSIS — A047 Enterocolitis due to Clostridium difficile: Secondary | ICD-10-CM | POA: Diagnosis present

## 2014-06-02 DIAGNOSIS — M79641 Pain in right hand: Secondary | ICD-10-CM

## 2014-06-02 DIAGNOSIS — Z9049 Acquired absence of other specified parts of digestive tract: Secondary | ICD-10-CM | POA: Diagnosis present

## 2014-06-02 DIAGNOSIS — A0472 Enterocolitis due to Clostridium difficile, not specified as recurrent: Secondary | ICD-10-CM

## 2014-06-02 DIAGNOSIS — K3189 Other diseases of stomach and duodenum: Secondary | ICD-10-CM | POA: Diagnosis present

## 2014-06-02 DIAGNOSIS — R627 Adult failure to thrive: Secondary | ICD-10-CM | POA: Diagnosis present

## 2014-06-02 DIAGNOSIS — J449 Chronic obstructive pulmonary disease, unspecified: Secondary | ICD-10-CM | POA: Diagnosis present

## 2014-06-02 DIAGNOSIS — M25531 Pain in right wrist: Secondary | ICD-10-CM | POA: Diagnosis present

## 2014-06-02 DIAGNOSIS — Z79899 Other long term (current) drug therapy: Secondary | ICD-10-CM

## 2014-06-02 LAB — URINALYSIS, ROUTINE W REFLEX MICROSCOPIC
Bilirubin Urine: NEGATIVE
GLUCOSE, UA: NEGATIVE mg/dL
Ketones, ur: NEGATIVE mg/dL
LEUKOCYTES UA: NEGATIVE
Nitrite: NEGATIVE
PH: 6.5 (ref 5.0–8.0)
Protein, ur: NEGATIVE mg/dL
Specific Gravity, Urine: 1.01 (ref 1.005–1.030)
Urobilinogen, UA: 0.2 mg/dL (ref 0.0–1.0)

## 2014-06-02 LAB — CBC WITH DIFFERENTIAL/PLATELET
BASOS ABS: 0 10*3/uL (ref 0.0–0.1)
Basophils Relative: 0 % (ref 0–1)
EOS PCT: 0 % (ref 0–5)
Eosinophils Absolute: 0 10*3/uL (ref 0.0–0.7)
HCT: 36.8 % (ref 36.0–46.0)
Hemoglobin: 12.3 g/dL (ref 12.0–15.0)
Lymphocytes Relative: 9 % — ABNORMAL LOW (ref 12–46)
Lymphs Abs: 1.4 10*3/uL (ref 0.7–4.0)
MCH: 28.8 pg (ref 26.0–34.0)
MCHC: 33.4 g/dL (ref 30.0–36.0)
MCV: 86.2 fL (ref 78.0–100.0)
Monocytes Absolute: 0.9 10*3/uL (ref 0.1–1.0)
Monocytes Relative: 6 % (ref 3–12)
Neutro Abs: 13 10*3/uL — ABNORMAL HIGH (ref 1.7–7.7)
Neutrophils Relative %: 85 % — ABNORMAL HIGH (ref 43–77)
Platelets: 297 10*3/uL (ref 150–400)
RBC: 4.27 MIL/uL (ref 3.87–5.11)
RDW: 16 % — ABNORMAL HIGH (ref 11.5–15.5)
WBC: 15.4 10*3/uL — ABNORMAL HIGH (ref 4.0–10.5)

## 2014-06-02 LAB — URINE MICROSCOPIC-ADD ON

## 2014-06-02 LAB — BASIC METABOLIC PANEL
Anion gap: 10 (ref 5–15)
Anion gap: 8 (ref 5–15)
BUN: 7 mg/dL (ref 6–23)
BUN: 7 mg/dL (ref 6–23)
CALCIUM: 8.8 mg/dL (ref 8.4–10.5)
CHLORIDE: 101 mmol/L (ref 96–112)
CHLORIDE: 103 mmol/L (ref 96–112)
CO2: 28 mmol/L (ref 19–32)
CO2: 25 mmol/L (ref 19–32)
Calcium: 8.4 mg/dL (ref 8.4–10.5)
Creatinine, Ser: 0.56 mg/dL (ref 0.50–1.10)
Creatinine, Ser: 0.7 mg/dL (ref 0.50–1.10)
GFR calc Af Amer: >90 mL/min (ref >90)
GFR calc Af Amer: >90 mL/min (ref >90)
GFR calc non Af Amer: >90 mL/min (ref >90)
GFR calc non Af Amer: 88 mL/min — ABNORMAL LOW (ref >90)
Glucose, Bld: 90 mg/dL (ref 70–99)
Glucose, Bld: 136 mg/dL — ABNORMAL HIGH (ref 70–99)
Potassium: 2.7 mmol/L — CL (ref 3.5–5.1)
Potassium: 3.9 mmol/L (ref 3.5–5.1)
Sodium: 139 mmol/L (ref 135–145)
Sodium: 136 mmol/L (ref 135–145)

## 2014-06-02 MED ORDER — ONDANSETRON HCL 4 MG/2ML IJ SOLN: 4.0000 mg | Freq: Four times a day (QID) | INTRAMUSCULAR | Status: DC | PRN

## 2014-06-02 MED ORDER — SODIUM CHLORIDE 0.9 % IV SOLN: 250.0000 mL | INTRAVENOUS | Status: DC | PRN

## 2014-06-02 MED ORDER — OXYCODONE HCL 5 MG PO TABS: 5.0000 mg | ORAL_TABLET | ORAL | Status: DC | PRN

## 2014-06-02 MED ORDER — PANTOPRAZOLE SODIUM 40 MG PO TBEC
40.0000 mg | DELAYED_RELEASE_TABLET | Freq: Every day | ORAL | Status: DC
  Administered 2014-06-02 – 2014-06-03 (×2): 40 mg via ORAL
  Filled 2014-06-02 (×3): qty 1

## 2014-06-02 MED ORDER — ALUM & MAG HYDROXIDE-SIMETH 200-200-20 MG/5ML PO SUSP: 30.0000 mL | Freq: Four times a day (QID) | ORAL | Status: DC | PRN

## 2014-06-02 MED ORDER — POLYETHYLENE GLYCOL 3350 17 G PO PACK
17.0000 g | PACK | Freq: Every day | ORAL | Status: DC
  Administered 2014-06-02: 17 g via ORAL
  Filled 2014-06-02 (×2): qty 1

## 2014-06-02 MED ORDER — FENTANYL CITRATE 0.05 MG/ML IJ SOLN
100.0000 ug | Freq: Once | INTRAMUSCULAR | Status: AC
  Administered 2014-06-02: 100 ug via INTRAVENOUS

## 2014-06-02 MED ORDER — OXYCODONE-ACETAMINOPHEN 5-325 MG PO TABS
ORAL_TABLET | ORAL | Status: AC
  Administered 2014-06-02: 1 via ORAL
  Filled 2014-06-02: qty 1

## 2014-06-02 MED ORDER — POTASSIUM CHLORIDE 10 MEQ/100ML IV SOLN
10.0000 meq | Freq: Once | INTRAVENOUS | Status: DC
  Filled 2014-06-02: qty 100

## 2014-06-02 MED ORDER — SODIUM CHLORIDE 0.9 % IJ SOLN: 3.0000 mL | INTRAMUSCULAR | Status: DC | PRN

## 2014-06-02 MED ORDER — OXYCODONE-ACETAMINOPHEN 5-325 MG PO TABS
1.0000 | ORAL_TABLET | Freq: Three times a day (TID) | ORAL | Status: DC | PRN
  Administered 2014-06-02: 1 via ORAL
  Filled 2014-06-02: qty 1

## 2014-06-02 MED ORDER — ENOXAPARIN SODIUM 30 MG/0.3ML ~~LOC~~ SOLN
30.0000 mg | SUBCUTANEOUS | Status: DC
  Filled 2014-06-02: qty 0.3

## 2014-06-02 MED ORDER — ONDANSETRON HCL 4 MG PO TABS: 4.0000 mg | ORAL_TABLET | Freq: Four times a day (QID) | ORAL | Status: DC | PRN

## 2014-06-02 MED ORDER — FENTANYL CITRATE 0.05 MG/ML IJ SOLN
INTRAMUSCULAR | Status: AC
  Filled 2014-06-02: qty 2

## 2014-06-02 MED ORDER — SUCRALFATE 1 G PO TABS
1.0000 g | ORAL_TABLET | Freq: Three times a day (TID) | ORAL | Status: DC
  Administered 2014-06-02 – 2014-06-05 (×12): 1 g via ORAL
  Filled 2014-06-02 (×15): qty 1

## 2014-06-02 MED ORDER — SODIUM CHLORIDE 0.9 % IJ SOLN
3.0000 mL | Freq: Two times a day (BID) | INTRAMUSCULAR | Status: DC
  Administered 2014-06-02 – 2014-06-04 (×5): 3 mL via INTRAVENOUS

## 2014-06-02 MED ORDER — FAMOTIDINE 20 MG PO TABS
20.0000 mg | ORAL_TABLET | Freq: Two times a day (BID) | ORAL | Status: DC
  Administered 2014-06-02 – 2014-06-03 (×3): 20 mg via ORAL
  Filled 2014-06-02 (×4): qty 1

## 2014-06-02 MED ORDER — FENTANYL CITRATE 0.05 MG/ML IJ SOLN
50.0000 ug | INTRAMUSCULAR | Status: DC | PRN
Start: 2014-06-02 — End: 2014-06-05

## 2014-06-02 MED ORDER — MORPHINE SULFATE 2 MG/ML IJ SOLN
2.0000 mg | INTRAMUSCULAR | Status: DC | PRN
Start: 2014-06-02 — End: 2014-06-02

## 2014-06-02 MED ORDER — POTASSIUM CHLORIDE CRYS ER 20 MEQ PO TBCR
20.0000 meq | EXTENDED_RELEASE_TABLET | Freq: Two times a day (BID) | ORAL | Status: DC
  Administered 2014-06-03 – 2014-06-05 (×5): 20 meq via ORAL
  Filled 2014-06-02 (×6): qty 1

## 2014-06-02 MED ORDER — VANCOMYCIN HCL IN DEXTROSE 750-5 MG/150ML-% IV SOLN
750.0000 mg | INTRAVENOUS | Status: DC
  Administered 2014-06-03: 750 mg via INTRAVENOUS
  Filled 2014-06-02 (×2): qty 150

## 2014-06-02 MED ORDER — VANCOMYCIN HCL IN DEXTROSE 1-5 GM/200ML-% IV SOLN: 1000.0000 mg | INTRAVENOUS | Status: DC

## 2014-06-02 MED ORDER — FLUOCINONIDE 0.05 % EX CREA
1.0000 "application " | TOPICAL_CREAM | Freq: Two times a day (BID) | CUTANEOUS | Status: DC | PRN
  Filled 2014-06-02: qty 30

## 2014-06-02 MED ORDER — MAGNESIUM SULFATE 2 GM/50ML IV SOLN
2.0000 g | Freq: Once | INTRAVENOUS | Status: AC
  Administered 2014-06-02: 2 g via INTRAVENOUS
  Filled 2014-06-02: qty 50

## 2014-06-02 MED ORDER — DIPHENHYDRAMINE HCL 12.5 MG/5ML PO ELIX: 12.5000 mg | ORAL_SOLUTION | Freq: Four times a day (QID) | ORAL | Status: DC | PRN

## 2014-06-02 MED ORDER — OXYCODONE-ACETAMINOPHEN 5-325 MG PO TABS
1.0000 | ORAL_TABLET | ORAL | Status: DC | PRN
  Administered 2014-06-02 – 2014-06-03 (×3): 1 via ORAL
  Filled 2014-06-02 (×2): qty 1

## 2014-06-02 MED ORDER — CILIDINIUM-CHLORDIAZEPOXIDE 2.5-5 MG PO CAPS
1.0000 | ORAL_CAPSULE | Freq: Three times a day (TID) | ORAL | Status: DC
  Administered 2014-06-02 (×2): 1 via ORAL
  Filled 2014-06-02 (×3): qty 1

## 2014-06-02 MED ORDER — VANCOMYCIN HCL IN DEXTROSE 1-5 GM/200ML-% IV SOLN
1000.0000 mg | Freq: Once | INTRAVENOUS | Status: AC
  Administered 2014-06-02: 1000 mg via INTRAVENOUS
  Filled 2014-06-02: qty 200

## 2014-06-02 MED ORDER — POTASSIUM CHLORIDE 10 MEQ/100ML IV SOLN
10.0000 meq | INTRAVENOUS | Status: AC
  Administered 2014-06-02 (×4): 10 meq via INTRAVENOUS
  Filled 2014-06-02 (×3): qty 100

## 2014-06-02 MED ORDER — POTASSIUM CHLORIDE CRYS ER 20 MEQ PO TBCR
40.0000 meq | EXTENDED_RELEASE_TABLET | ORAL | Status: AC
  Administered 2014-06-02 (×2): 40 meq via ORAL
  Filled 2014-06-02 (×2): qty 2

## 2014-06-02 NOTE — Telephone Encounter (Signed)
If swelling is worsening, have patient go to ED either tonight or first thing in am

## 2014-06-02 NOTE — H&P (Addendum)
[facility] History and Physical  CIERRAH DACE LZJ:[id number] DOB: 22-Oct-1945 DOA: 06/02/2014   PCP: [name] Koyanagi, MD    Chief Complaint: right hand swelling  HPI: Cynthia Bowen is a 69 y.o. female with PMH of GERD, COPD presenting with swelling of right hand and pain. Pain started 2 days ago and swelling started yesterday. She went to urgent care and received Rocphin and Augmentin but swelling and pain worsened. She is being admitted for cellulitis. She also is hypokalemic. No h/o fevers.    General: The patient denies anorexia, fever, weight loss Cardiac: Denies chest pain, syncope, palpitations, pedal edema  Respiratory: + cough with clear sputum- no shortness of breath, wheezing GI: Denies severe indigestion/heartburn, abdominal pain, nausea, vomiting,+ 3 episodes of diarrhea today- had a colonoscopy last Thursday- apparently no polyps per pateint GU: Denies hematuria, incontinence, + dysuria started on friday Musculoskeletal: Denies arthritis  Skin: Denies suspicious skin lesions Neurologic: Denies focal weakness or numbness, change in vision Psychiatry: Denies depression or anxiety. Hematologic: no easy bruising or bleeding   Past Medical History  Diagnosis Date  . COPD (chronic obstructive pulmonary disease)   . Pneumonia 12-2011  . GERD (gastroesophageal reflux disease)   . Headache(784.0)   . Arthritis     osteoarthritis  . Allergy   . Depression   . Neuromuscular disorder   . Osteoporosis   . Bronchitis     CURRENTLY AS OF 06/30/12 - HAS COUGH AND FINISHED ANTIBIOTIC FOR BRONCHITIS  . Fibromyalgia   . Pain     ABDOMINAL PAIN AND NAUSEA  . Pain     SOMETIMES PAIN RIGHT EAR AND NECK--STATES CAUSED BY A "LUMP" ON BACK OF EAR--USES KENALOG CREAM TOPICALLY AS NEEDED.  Marland Kitchen Gastrocutaneous fistula   . Anemia   . Anxiety   . Blood transfusion without reported diagnosis     Past Surgical History  Procedure Laterality Date  . Abdominal hysterectomy    .  Esophagogastroduodenoscopy  04/18/2012    Procedure: ESOPHAGOGASTRODUODENOSCOPY (EGD);  Surgeon: Inda Castle, MD;  Location: [facility];  Service: Endoscopy;  Laterality: N/A;  . Eus  05/29/2012    Procedure: UPPER ENDOSCOPIC ULTRASOUND (EUS) LINEAR;  Surgeon: Milus Banister, MD;  Location: [facility];  Service: Endoscopy;  Laterality: N/A;  . Appendectomy    . Spine surgery      CERVICAL SPINE SURGERY X 2 - INCLUDING FUSION; LUMBAR SURGERY FOR RUPTURED DISC  . Eye surgery      BILATERAL CATARACT EXTRACTIONS  . Laparoscopic abdominal exploration N/A 07/02/2012    Procedure: converted to laparotomy with gastric biopsy;  Surgeon: Imogene Burn. Georgette Dover, MD;  Location: [facility];  Service: General;  Laterality: N/A;  Laparoscopic Gastric Biospy attempted.   . Laparotomy N/A 07/07/2012    Procedure: EXPLORATORY LAPAROTOMY repair of gastric perforation with omental graham patch, drainage of abdominal abcess;  Surgeon: Imogene Burn. Georgette Dover, MD;  Location: [facility];  Service: General;  Laterality: N/A;  . Stomach surgery  07/07/2012    Omental patch of gastric perforation  . Laparotomy N/A 07/13/2012    Procedure: EXPLORATORY LAPAROTOMY repair gastric leak;  Surgeon: Edward Jolly, MD;  Location: [facility];  Service: General;  Laterality: N/A;  . Laparotomy N/A 11/11/2012    Procedure: EXPLORATORY LAPAROTOMY;  Surgeon: Imogene Burn. Georgette Dover, MD;  Location: [place];  Service: General;  Laterality: N/A;  . Bowel resection N/A 11/11/2012    Procedure: SMALL BOWEL RESECTION;  Surgeon: Imogene Burn. Tsuei, MD;  Location: [facility];  Service: General;  Laterality: N/A;  . Minor application of wound vac N/A 11/11/2012    Procedure: APPLICATION OF WOUND VAC;  Surgeon: Imogene Burn. Georgette Dover, MD;  Location: [place];  Service: General;  Laterality: N/A;  . Jejunostomy N/A 11/11/2012    Procedure: PLACEMENT OF FEEDING JEJUNOSTOMY TUBE;  Surgeon: Imogene Burn. Georgette Dover, MD;  Location: [place];  Service: General;  Laterality: N/A;  . Lysis of adhesion  N/A 11/11/2012    Procedure: LYSIS OF ADHESION;  Surgeon: Imogene Burn. Georgette Dover, MD;  Location: [place];  Service: General;  Laterality: N/A;  . Hernia repair    . Gastric fistula repair      05/16/2013  . Colonoscopy    . Polypectomy    . Upper gastrointestinal endoscopy      Social History: non-smoker, does not drink alcohol, no drug use Lives at home alone    Allergies  Allergen Reactions  . Avelox [Moxifloxacin Hcl In Nacl] Nausea And Vomiting  . Betadine [Povidone Iodine] Itching and Rash  . Alendronate Sodium Nausea And Vomiting  . Alendronate Sodium     Other reaction(s): Dizziness (intolerance)  . Aspirin Nausea Only  . Codeine Nausea And Vomiting  . Doxycycline     Pt doesn't remember reaction  . Fluconazole     Pt doesn't remember reaction  . Hydrocodone Nausea And Vomiting    GI distress  . Hydrocodone-Acetaminophen Nausea And Vomiting  . Latex   . Neurontin [Gabapentin] Other (See Comments)    Mood changes   . Nsaids Other (See Comments)    Severe gastritis & perforation - avoid NSAIDs when possible  . Quinolones Itching and Hives  . Sertraline Hcl Other (See Comments)    Hallucinations   . Sertraline Hcl Nausea And Vomiting  . Sulfamethoxazole Itching and Hives  . Sulfa Antibiotics Rash    Family History  Problem Relation Age of Onset  . COPD Mother   . Hyperlipidemia Mother   . Hypertension Maternal Grandmother   . Stroke Maternal Grandmother   . Colon cancer Neg Hx      Prior to Admission medications   Medication Sig Start Date End Date Taking? Authorizing Provider  albuterol (PROVENTIL HFA;VENTOLIN HFA) 108 (90 BASE) MCG/ACT inhaler Inhale 2 puffs into the lungs every 6 (six) hours as needed for wheezing. 11/17/13  Yes [name] Koyanagi, MD  amoxicillin-clavulanate (AUGMENTIN) 875-125 MG per tablet Take 1 tablet by mouth 2 (two) times daily. 06/01/14  Yes Orma Flaming, MD  CALCIUM PO Take 1 tablet by mouth daily.   Yes Historical Provider, MD   clidinium-chlordiazePOXIDE (LIBRAX) 5-2.5 MG per capsule Take 1 capsule by mouth 4 (four) times daily -  before meals and at bedtime. As needed for abdominal pain 03/15/14  Yes [name] Koyanagi, MD  diphenhydrAMINE (BENADRYL) 12.5 MG/5ML liquid Take 12.5 mg by mouth 4 (four) times daily as needed for itching.    Yes Historical Provider, MD  EPINEPHrine (EPIPEN) 0.3 mg/0.3 mL IJ SOAJ injection Inject 0.3 mLs (0.3 mg total) into the muscle daily as needed (anaphylaxis). 10/07/13  Yes [name] Koyanagi, MD  fluocinonide cream (LIDEX) 1.61 % APPLY 1 APPLICATION TOPICALLY 2 (TWO) TIMES DAILY. Patient taking differently: Apply 1 application topically 2 (two) times daily as needed (rash).  12/09/13  Yes [name] Koyanagi, MD  ketoconazole (NIZORAL) 2 % shampoo Apply 1 application topically 2 (two) times a week. 10/28/13  Yes [name] Koyanagi, MD  Multiple Vitamin (MULTIVITAMIN)  tablet Take 1 tablet by mouth daily.   Yes Historical Provider, MD  omeprazole (PRILOSEC) 40 MG capsule Take 1 capsule (40 mg total) by mouth daily. 05/26/14  Yes Posey Boyer, MD  ondansetron (ZOFRAN) 4 MG tablet TAKE 1 TABLET (4 MG TOTAL) BY MOUTH EVERY 8 (EIGHT) HOURS AS NEEDED FOR NAUSEA OR VOMITING. 05/10/14  Yes [name] Koyanagi, MD  oxyCODONE-acetaminophen (ROXICET) 5-325 MG per tablet Take 1 tablet by mouth every 8 (eight) hours as needed for severe pain. 05/26/14  Yes [name] Koyanagi, MD  polyethylene glycol powder (GLYCOLAX/MIRALAX) powder Take 17 g by mouth daily. 01/28/14  Yes Wardell Honour, MD  potassium chloride SA (K-DUR,KLOR-CON) 20 MEQ tablet Take 1 tablet BID for 2 days, Take 1 tablet once a day for 4 days 05/26/14  Yes Chelle S Jeffery, PA-C  Probiotic Product (PROBIOTIC DAILY PO) Take 1 capsule by mouth daily.   Yes Historical Provider, MD  ranitidine (ZANTAC) 150 MG tablet TAKE 1 TABLET BY MOUTH TWICE A DAY 04/14/14  Yes [name] Koyanagi, MD  sucralfate (CARAFATE) 1 G tablet Take 1 tablet (1 g total) by mouth  4 (four) times daily -  with meals and at bedtime. 05/26/14  Yes Posey Boyer, MD  Vitamin D, Ergocalciferol, (DRISDOL) 50000 UNITS CAPS capsule Take 1 capsule (50,000 Units total) by mouth every 7 (seven) days. 05/22/14  Yes Robyn Haber, MD  alendronate (FOSAMAX) 70 MG tablet Take 1 tablet (70 mg total) by mouth every 7 (seven) days. Take with a full glass of water on an empty stomach. Patient not taking: Reported on 06/01/2014 05/22/14   Robyn Haber, MD  Calcium Carb-Cholecalciferol (CALCIUM 1000 + D) 1000-800 MG-UNIT TABS Take 1,000 tablets by mouth daily. Patient not taking: Reported on 06/02/2014 05/26/14   Fara Chute, PA-C     Physical Exam: Filed Vitals:   06/02/14 1001 06/02/14 1212  BP: 164/79 155/72  Pulse: 90 82  Temp: 98.3 F (36.8 C)   TempSrc: Oral   Resp: 18 16  SpO2: 100% 100%     General: AAO x3 , no distress HEENT: Normocephalic and Atraumatic, Mucous membranes pink- dry oral mucosa                PERRLA; EOM intact; No scleral icterus,                 Nares: Patent, Oropharynx: Clear, Fair Dentition                 Neck: FROM, no cervical lymphadenopathy, thyromegaly, carotid bruit or JVD;  Breasts: deferred CHEST WALL: No tenderness  CHEST: Normal respiration, clear to auscultation bilaterally  HEART: Regular rate and rhythm; no murmurs rubs or gallops  BACK: No kyphosis or scoliosis; no CVA tenderness  GI: Positive Bowel Sounds, soft, non-tender; no masses, no organomegaly Rectal Exam: deferred MSK: No cyanosis, clubbing, or edema of legs- right hand, fingers and wrist swollen, tender and warm to touch Genitalia: not examined  SKIN:  no rash or ulceration  CNS: Alert and Oriented x 4, Nonfocal exam, CN 2-12 intact  Labs on Admission:  Basic Metabolic Panel:  Recent Labs Lab 05/26/14 1451 06/02/14 1128  NA 146* 139  K 3.0* 2.7*  CL 110 101  CO2 28 28  GLUCOSE 75 90  BUN 9 7  CREATININE 0.53 0.56  CALCIUM 8.3* 8.8   Liver Function  Tests:  Recent Labs Lab 05/26/14 1451  AST 15  ALT 12  ALKPHOS 91  BILITOT 0.3  PROT 6.5  ALBUMIN 3.2*   No results for input(s): LIPASE, AMYLASE in the last 168 hours. No results for input(s): AMMONIA in the last 168 hours. CBC:  Recent Labs Lab 05/26/14 1453 06/02/14 1128  WBC 7.4 15.4*  NEUTROABS  --  13.0*  HGB 11.1* 12.3  HCT 34.5* 36.8  MCV 89.0 86.2  PLT  --  297   Cardiac Enzymes: No results for input(s): CKTOTAL, CKMB, CKMBINDEX, TROPONINI in the last 168 hours.  BNP (last 3 results) No results for input(s): BNP in the last 8760 hours.  ProBNP (last 3 results) No results for input(s): PROBNP in the last 8760 hours.  CBG: No results for input(s): GLUCAP in the last 168 hours.  Radiological Exams on Admission: Dg Wrist Complete Right  06/01/2014   CLINICAL DATA:  Sudden onset of redness and swelling of the right wrist with tenderness.  EXAM: RIGHT WRIST - COMPLETE 3+ VIEW  COMPARISON:  None.  FINDINGS: There small cystic degenerative changes in the lunate and triquetrum. There is no joint space narrowing. There are slight degenerative changes of the first metacarpal phalangeal joint.  There is soft tissue swelling around the wrist.  IMPRESSION: Soft tissue swelling around the wrist, nonspecific. Minimal degenerative changes.   Electronically Signed   By: Lorriane Shire M.D.   On: 06/01/2014 11:00      Assessment/Plan Principal Problem:   Cellulitis of hand - failed outpt treatment with Rocephin and Augmentin - started on VAnc and pain medications - she has numerous allergies to antiobiotics- some of which she cannot remember the reaction but meds include Sulfa, Doxy, Quinolones (Avelox)  Active Problems:  Hypokalemia - severe- likely has baseline Hypokalemia as she take K+ tabs at home - this was exacerbated by diarrhea today - replacing aggressively with IV and oral KCL- recheck later today    COPD GOLD I Stable    Gastric perforation with  abscess/peritonitis s/p omental patch repair x2 [id number] - cont PPI, Pepcid and Sucralfate   Consulted:   Code Status: full code Family Communication:   DVT Prophylaxis:Lovenox  Time spent: 76 min  [place], MD [facility]  If 7PM-7AM, please contact night-coverage [email or web address] 06/02/2014, 1:13 PM

## 2014-06-02 NOTE — ED Notes (Signed)
I tried to get blood but was unsuccessful

## 2014-06-02 NOTE — Progress Notes (Signed)
69 yo woman with acute right hand and wrist swelling and pain that began Monday two days ago.  Worse today despite visit yesterday where she was thought to have cellulitis.  No CBC could be obtained.  X-ray nondiagnostic.  Oxycodone not controlling the pain.Marland Kitchenshe was up all night in pain.  No h/o gout but she has seen rheumatology with dx of fibromyalgia  Objective:  Patient in acute distress, crying in pain The right hand and wrist are swollen and exquisitely tender with some erythema, no warmth She can barely move the fingers and hand She is right hand dominant  Assessment:  Several diagnoses possible:  Cellulitis, gout, phlebitis following multiple phlebotomy sticks last week  Plan:  There is little I have to offer.  We can't get her blood for CBC because of her tiny veins.  She is worsening and not responding to the rocephin and augmentin in past 24 hours.  The oxycodone she took is not touching the pain  I will send her to hospital for further evaluation and better pain control.  Robyn Haber, MD

## 2014-06-02 NOTE — ED Notes (Signed)
Pt reports hand pain that started Monday and was seen at Urgent Care. Denies fall or injury. Pt reports swelling started yesterday and made a return trip to Urgent Care today and told to come here. Left Hand is swollen. No other c/o.

## 2014-06-02 NOTE — Patient Instructions (Signed)
Go to the emergency department at [facility]

## 2014-06-02 NOTE — Progress Notes (Signed)
ANTIBIOTIC CONSULT NOTE - INITIAL  Pharmacy Consult for:  Vancomycin Indication:  Cellulitis of right hand  Allergies  Allergen Reactions  . Avelox [Moxifloxacin Hcl In Nacl] Nausea And Vomiting  . Betadine [Povidone Iodine] Itching and Rash  . Alendronate Sodium Nausea And Vomiting  . Alendronate Sodium     Other reaction(s): Dizziness (intolerance)  . Aspirin Nausea Only  . Codeine Nausea And Vomiting  . Doxycycline     Pt doesn't remember reaction  . Fluconazole     Pt doesn't remember reaction  . Hydrocodone Nausea And Vomiting    GI distress  . Hydrocodone-Acetaminophen Nausea And Vomiting  . Latex   . Neurontin [Gabapentin] Other (See Comments)    Mood changes   . Nsaids Other (See Comments)    Severe gastritis & perforation - avoid NSAIDs when possible  . Quinolones Itching and Hives  . Sertraline Hcl Other (See Comments)    Hallucinations   . Sertraline Hcl Nausea And Vomiting  . Sulfamethoxazole Itching and Hives  . Sulfa Antibiotics Rash    Patient Measurements: Height: 5\' 5"  (165.1 cm) Weight: 84 lb 14 oz (38.5 kg) IBW/kg (Calculated) : 57   Vital Signs: Temp: 99.6 F (37.6 C) (02/10 1357) Temp Source: Oral (02/10 1001) BP: 155/72 mmHg (02/10 1212) Pulse Rate: 94 (02/10 1357)  Labs:  Recent Labs  06/02/14 1128  WBC 15.4*  HGB 12.3  PLT 297  CREATININE 0.56   Estimated Creatinine Clearance: 41.5 mL/min (by C-G formula based on Cr of 0.56).    Microbiology: No results found for this or any previous visit (from the past 720 hour(s)).  Medical History: Past Medical History  Diagnosis Date  . COPD (chronic obstructive pulmonary disease)   . Pneumonia 12-2011  . GERD (gastroesophageal reflux disease)   . Headache(784.0)   . Arthritis     osteoarthritis  . Allergy   . Depression   . Neuromuscular disorder   . Osteoporosis   . Bronchitis     CURRENTLY AS OF 06/30/12 - HAS COUGH AND FINISHED ANTIBIOTIC FOR BRONCHITIS  . Fibromyalgia   .  Pain     ABDOMINAL PAIN AND NAUSEA  . Pain     SOMETIMES PAIN RIGHT EAR AND NECK--STATES CAUSED BY A "LUMP" ON BACK OF EAR--USES KENALOG CREAM TOPICALLY AS NEEDED.  Marland Kitchen Gastrocutaneous fistula   . Anemia   . Anxiety   . Blood transfusion without reported diagnosis     Medications:  Scheduled:  . clidinium-chlordiazePOXIDE  1 capsule Oral TID AC & HS  . famotidine  20 mg Oral BID  . magnesium sulfate 1 - 4 g bolus IVPB  2 g Intravenous Once  . pantoprazole  40 mg Oral Daily  . polyethylene glycol  17 g Oral Daily  . potassium chloride  10 mEq Intravenous Q1 Hr x 4  . [START ON 06/03/2014] potassium chloride SA  20 mEq Oral BID  . sodium chloride  3 mL Intravenous Q12H  . sucralfate  1 g Oral TID WC & HS  . [START ON 06/03/2014] vancomycin  750 mg Intravenous Q24H   Assessment:  Asked to assist with Vancomycin therapy for this 69 year-old female with cellulitis of the right hand.  The medical record indicates that Ms. Casa has received outpatient Rocephin and Augmentin, but the swelling and pain have worsened.    Goal of Therapy:   Vancomycin trough levels 10-15 mcg/ml  Dose appropriate for renal function  Eradication of infection  Plan:  Vancomycin 1000 mg IV x 1 dose (given earlier today)  Vancomycin 750 mg IV every 24 hours to begin on 06/03/14  Drug levels as needed  [place].Ph. 06/02/2014,4:07 PM

## 2014-06-02 NOTE — ED Provider Notes (Signed)
CSN: [id number]     Arrival date & time 06/02/14  4562 History   First MD Initiated Contact with Patient 06/02/14 1046     Chief Complaint  Patient presents with  . Hand Pain  . From PCP   . Hand swelling      (Consider location/radiation/quality/duration/timing/severity/associated sxs/prior Treatment) HPI Comments: The patient is a 69 year old female, she has a history of COPD as well as a history of arthritis, and a history of anemia requiring blood transfusion. She presents to the hospital with a swollen right wrist and hand which has been swollen for 3 days. She was seen approximately one week ago in the family doctor's office at [facility] urgent care when she was having abdominal discomfort. A blood draw at that time required multiple IV sticks, ultimately she developed swelling of her hand and was seen at the office yesterday, given Rocephin injection and Augmentin. Despite this she has developed worsening redness swelling and warmth to her right hand and wrist which appears to be spreading proximally up her arm. She denies fevers but has said that she feels chills. Symptoms are progressive, nothing makes this better, worse with range of motion of the wrist. She was sent to the hospital for admission and IV antibiotics as she has multiple allergies to MRSA coverage antibiotics.  Patient is a 69 y.o. female presenting with hand pain. The history is provided by the patient.  Hand Pain    Past Medical History  Diagnosis Date  . COPD (chronic obstructive pulmonary disease)   . Pneumonia 12-2011  . GERD (gastroesophageal reflux disease)   . Headache(784.0)   . Arthritis     osteoarthritis  . Allergy   . Depression   . Neuromuscular disorder   . Osteoporosis   . Bronchitis     CURRENTLY AS OF 06/30/12 - HAS COUGH AND FINISHED ANTIBIOTIC FOR BRONCHITIS  . Fibromyalgia   . Pain     ABDOMINAL PAIN AND NAUSEA  . Pain     SOMETIMES PAIN RIGHT EAR AND NECK--STATES CAUSED BY A "LUMP" ON BACK  OF EAR--USES KENALOG CREAM TOPICALLY AS NEEDED.  Marland Kitchen Gastrocutaneous fistula   . Anemia   . Anxiety   . Blood transfusion without reported diagnosis    Past Surgical History  Procedure Laterality Date  . Abdominal hysterectomy    . Esophagogastroduodenoscopy  04/18/2012    Procedure: ESOPHAGOGASTRODUODENOSCOPY (EGD);  Surgeon: Inda Castle, MD;  Location: [facility];  Service: Endoscopy;  Laterality: N/A;  . Eus  05/29/2012    Procedure: UPPER ENDOSCOPIC ULTRASOUND (EUS) LINEAR;  Surgeon: Milus Banister, MD;  Location: [facility];  Service: Endoscopy;  Laterality: N/A;  . Appendectomy    . Spine surgery      CERVICAL SPINE SURGERY X 2 - INCLUDING FUSION; LUMBAR SURGERY FOR RUPTURED DISC  . Eye surgery      BILATERAL CATARACT EXTRACTIONS  . Laparoscopic abdominal exploration N/A 07/02/2012    Procedure: converted to laparotomy with gastric biopsy;  Surgeon: Imogene Burn. Georgette Dover, MD;  Location: [facility];  Service: General;  Laterality: N/A;  Laparoscopic Gastric Biospy attempted.   . Laparotomy N/A 07/07/2012    Procedure: EXPLORATORY LAPAROTOMY repair of gastric perforation with omental graham patch, drainage of abdominal abcess;  Surgeon: Imogene Burn. Georgette Dover, MD;  Location: [facility];  Service: General;  Laterality: N/A;  . Stomach surgery  07/07/2012    Omental patch of gastric perforation  . Laparotomy N/A 07/13/2012    Procedure: EXPLORATORY LAPAROTOMY repair  gastric leak;  Surgeon: Edward Jolly, MD;  Location: [facility];  Service: General;  Laterality: N/A;  . Laparotomy N/A 11/11/2012    Procedure: EXPLORATORY LAPAROTOMY;  Surgeon: Imogene Burn. Georgette Dover, MD;  Location: [place];  Service: General;  Laterality: N/A;  . Bowel resection N/A 11/11/2012    Procedure: SMALL BOWEL RESECTION;  Surgeon: Imogene Burn. Georgette Dover, MD;  Location: [place];  Service: General;  Laterality: N/A;  . Minor application of wound vac N/A 11/11/2012    Procedure: APPLICATION OF WOUND VAC;  Surgeon: Imogene Burn. Georgette Dover, MD;   Location: [place];  Service: General;  Laterality: N/A;  . Jejunostomy N/A 11/11/2012    Procedure: PLACEMENT OF FEEDING JEJUNOSTOMY TUBE;  Surgeon: Imogene Burn. Georgette Dover, MD;  Location: [place];  Service: General;  Laterality: N/A;  . Lysis of adhesion N/A 11/11/2012    Procedure: LYSIS OF ADHESION;  Surgeon: Imogene Burn. Georgette Dover, MD;  Location: [place];  Service: General;  Laterality: N/A;  . Hernia repair    . Gastric fistula repair      05/16/2013  . Colonoscopy    . Polypectomy    . Upper gastrointestinal endoscopy     Family History  Problem Relation Age of Onset  . COPD Mother   . Hyperlipidemia Mother   . Hypertension Maternal Grandmother   . Stroke Maternal Grandmother   . Colon cancer Neg Hx    History  Substance Use Topics  . Smoking status: Former Smoker -- 0.50 packs/day for 35 years    Types: Cigarettes    Start date: 01/04/2003    Quit date: 04/17/2005  . Smokeless tobacco: Never Used  . Alcohol Use: No   OB History    No data available     Review of Systems  All other systems reviewed and are negative.     Allergies  Avelox; Betadine; Alendronate sodium; Alendronate sodium; Aspirin; Codeine; Doxycycline; Fluconazole; Hydrocodone; Hydrocodone-acetaminophen; Latex; Neurontin; Nsaids; Quinolones; Sertraline hcl; Sertraline hcl; Sulfamethoxazole; and Sulfa antibiotics  Home Medications   Prior to Admission medications   Medication Sig Start Date End Date Taking? Authorizing Provider  albuterol (PROVENTIL HFA;VENTOLIN HFA) 108 (90 BASE) MCG/ACT inhaler Inhale 2 puffs into the lungs every 6 (six) hours as needed for wheezing. Patient not taking: Reported on 06/01/2014 11/17/13   [name] Koyanagi, MD  alendronate (FOSAMAX) 70 MG tablet Take 1 tablet (70 mg total) by mouth every 7 (seven) days. Take with a full glass of water on an empty stomach. Patient not taking: Reported on 06/01/2014 05/22/14   Robyn Haber, MD  amoxicillin-clavulanate (AUGMENTIN) 875-125 MG per tablet  Take 1 tablet by mouth 2 (two) times daily. 06/01/14   Orma Flaming, MD  Calcium Carb-Cholecalciferol (CALCIUM 1000 + D) 1000-800 MG-UNIT TABS Take 1,000 tablets by mouth daily. 05/26/14   Chelle S Jeffery, PA-C  clidinium-chlordiazePOXIDE (LIBRAX) 5-2.5 MG per capsule Take 1 capsule by mouth 4 (four) times daily -  before meals and at bedtime. As needed for abdominal pain 03/15/14   [name] Koyanagi, MD  diphenhydrAMINE (BENADRYL) 12.5 MG/5ML liquid Take 12.5 mg by mouth 4 (four) times daily as needed for itching.     Historical Provider, MD  EPINEPHrine (EPIPEN) 0.3 mg/0.3 mL IJ SOAJ injection Inject 0.3 mLs (0.3 mg total) into the muscle daily as needed (anaphylaxis). 10/07/13   [name] Koyanagi, MD  fluocinonide cream (LIDEX) 1.74 % APPLY 1 APPLICATION TOPICALLY 2 (TWO) TIMES DAILY. 12/09/13   [name] Koyanagi, MD  gabapentin (NEURONTIN) 300 MG capsule  01/09/14   Historical Provider, MD  ketoconazole (NIZORAL) 2 % shampoo Apply 1 application topically 2 (two) times a week. 10/28/13   [name] Koyanagi, MD  Multiple Vitamin (MULTIVITAMIN) tablet Take 1 tablet by mouth daily.    Historical Provider, MD  omeprazole (PRILOSEC) 40 MG capsule Take 1 capsule (40 mg total) by mouth daily. 05/26/14   Posey Boyer, MD  ondansetron (ZOFRAN) 4 MG tablet TAKE 1 TABLET (4 MG TOTAL) BY MOUTH EVERY 8 (EIGHT) HOURS AS NEEDED FOR NAUSEA OR VOMITING. 05/10/14   [name] Koyanagi, MD  oxyCODONE-acetaminophen (ROXICET) 5-325 MG per tablet Take 1 tablet by mouth every 8 (eight) hours as needed for severe pain. 05/26/14   [name] Koyanagi, MD  polyethylene glycol powder (GLYCOLAX/MIRALAX) powder Take 17 g by mouth daily. 01/28/14   Wardell Honour, MD  potassium chloride SA (K-DUR,KLOR-CON) 20 MEQ tablet Take 1 tablet BID for 2 days, Take 1 tablet once a day for 4 days 05/26/14   Fara Chute, PA-C  Probiotic Product (PROBIOTIC DAILY PO) Take 1 capsule by mouth daily.    Historical Provider, MD  ranitidine (ZANTAC) 150  MG tablet TAKE 1 TABLET BY MOUTH TWICE A DAY 04/14/14   [name] Koyanagi, MD  sucralfate (CARAFATE) 1 G tablet Take 1 tablet (1 g total) by mouth 4 (four) times daily -  with meals and at bedtime. 05/26/14   Posey Boyer, MD  Vitamin D, Ergocalciferol, (DRISDOL) 50000 UNITS CAPS capsule Take 1 capsule (50,000 Units total) by mouth every 7 (seven) days. 05/22/14   Robyn Haber, MD   BP 164/79 mmHg  Pulse 90  Temp(Src) 98.3 F (36.8 C) (Oral)  Resp 18  SpO2 100% Physical Exam  Constitutional: She appears well-developed and well-nourished. No distress.  HENT:  Head: Normocephalic and atraumatic.  Mouth/Throat: Oropharynx is clear and moist. No oropharyngeal exudate.  Eyes: Conjunctivae and EOM are normal. Pupils are equal, round, and reactive to light. Right eye exhibits no discharge. Left eye exhibits no discharge. No scleral icterus.  Neck: Normal range of motion. Neck supple. No JVD present. No thyromegaly present.  Cardiovascular: Normal rate, regular rhythm, normal heart sounds and intact distal pulses.  Exam reveals no gallop and no friction rub.   No murmur heard. Pulmonary/Chest: Effort normal and breath sounds normal. No respiratory distress. She has no wheezes. She has no rales.  Abdominal: Soft. Bowel sounds are normal. She exhibits no distension and no mass. There is no tenderness.  Musculoskeletal: She exhibits tenderness ( swelling and redness involving the entire R hand and wrist - redness extends proximally up to the proximal upper arm.  supple elbow joint). She exhibits no edema.  Dec ROM of the R wrist 2/2 pain  Lymphadenopathy:    She has no cervical adenopathy.  Neurological: She is alert. Coordination normal.  Skin: Skin is warm and dry. Rash noted. There is erythema.  Psychiatric: She has a normal mood and affect. Her behavior is normal.  Nursing note and vitals reviewed.   ED Course  Procedures (including critical care time) Labs Review Labs Reviewed  CBC  WITH DIFFERENTIAL/PLATELET  BASIC METABOLIC PANEL    Imaging Review Dg Wrist Complete Right  06/01/2014   CLINICAL DATA:  Sudden onset of redness and swelling of the right wrist with tenderness.  EXAM: RIGHT WRIST - COMPLETE 3+ VIEW  COMPARISON:  None.  FINDINGS: There small cystic degenerative changes in the lunate and  triquetrum. There is no joint space narrowing. There are slight degenerative changes of the first metacarpal phalangeal joint.  There is soft tissue swelling around the wrist.  IMPRESSION: Soft tissue swelling around the wrist, nonspecific. Minimal degenerative changes.   Electronically Signed   By: Lorriane Shire M.D.   On: 06/01/2014 11:00       MDM   Final diagnoses:  None    Patient has cellulitis and significant infection of the hand - doubt septic arthritis without obvious seeding of the joints - this possibly started out as phlebitis and spread as cellulitis.  Unfortunately she has allergies to multiple antibiotics and will need Vancomycin as she has failed outpatient therapy with Rocephin and Augmentin as tx for MSSA.    D/w hospitalist who agrees with admission.    Johnna Acosta, MD 06/03/14 [phone]

## 2014-06-02 NOTE — Telephone Encounter (Signed)
Any other suggestions for pt ?  

## 2014-06-02 NOTE — ED Notes (Signed)
Upon futher questioning pt had 3 hand xrays that were negative yesterday and also treated for possible cellulitis that is worse today.

## 2014-06-03 ENCOUNTER — Inpatient Hospital Stay (HOSPITAL_COMMUNITY): Payer: Medicare Other

## 2014-06-03 DIAGNOSIS — A047 Enterocolitis due to Clostridium difficile: Secondary | ICD-10-CM

## 2014-06-03 DIAGNOSIS — L03119 Cellulitis of unspecified part of limb: Secondary | ICD-10-CM

## 2014-06-03 DIAGNOSIS — A0472 Enterocolitis due to Clostridium difficile, not specified as recurrent: Secondary | ICD-10-CM

## 2014-06-03 DIAGNOSIS — M129 Arthropathy, unspecified: Secondary | ICD-10-CM

## 2014-06-03 LAB — BASIC METABOLIC PANEL
Anion gap: 9 (ref 5–15)
BUN: 8 mg/dL (ref 6–23)
CO2: 26 mmol/L (ref 19–32)
Calcium: 8.6 mg/dL (ref 8.4–10.5)
Chloride: 105 mmol/L (ref 96–112)
Creatinine, Ser: 0.55 mg/dL (ref 0.50–1.10)
GFR calc Af Amer: >90 mL/min (ref >90)
GLUCOSE: 91 mg/dL (ref 70–99)
POTASSIUM: 3.8 mmol/L (ref 3.5–5.1)
Sodium: 140 mmol/L (ref 135–145)

## 2014-06-03 LAB — CLOSTRIDIUM DIFFICILE BY PCR: Toxigenic C. Difficile by PCR: POSITIVE — AB

## 2014-06-03 LAB — CBC
HCT: 35.2 % — ABNORMAL LOW (ref 36.0–46.0)
Hemoglobin: 12.2 g/dL (ref 12.0–15.0)
MCH: 29.6 pg (ref 26.0–34.0)
MCHC: 34.7 g/dL (ref 30.0–36.0)
MCV: 85.4 fL (ref 78.0–100.0)
Platelets: 287 10*3/uL (ref 150–400)
RBC: 4.12 MIL/uL (ref 3.87–5.11)
RDW: 16 % — AB (ref 11.5–15.5)
WBC: 7.8 10*3/uL (ref 4.0–10.5)

## 2014-06-03 LAB — HIV ANTIBODY (ROUTINE TESTING W REFLEX): HIV Screen 4th Generation wRfx: NONREACTIVE

## 2014-06-03 LAB — URIC ACID: URIC ACID, SERUM: 3.8 mg/dL (ref 2.4–7.0)

## 2014-06-03 MED ORDER — KETOROLAC TROMETHAMINE 15 MG/ML IJ SOLN
15.0000 mg | Freq: Three times a day (TID) | INTRAMUSCULAR | Status: AC
  Administered 2014-06-03 – 2014-06-04 (×4): 15 mg via INTRAVENOUS
  Filled 2014-06-03 (×5): qty 1

## 2014-06-03 MED ORDER — METRONIDAZOLE 500 MG PO TABS
500.0000 mg | ORAL_TABLET | Freq: Three times a day (TID) | ORAL | Status: DC
  Administered 2014-06-03 – 2014-06-05 (×6): 500 mg via ORAL
  Filled 2014-06-03 (×9): qty 1

## 2014-06-03 MED ORDER — SACCHAROMYCES BOULARDII 250 MG PO CAPS
250.0000 mg | ORAL_CAPSULE | Freq: Two times a day (BID) | ORAL | Status: DC
  Administered 2014-06-03 – 2014-06-05 (×4): 250 mg via ORAL
  Filled 2014-06-03 (×5): qty 1

## 2014-06-03 MED ORDER — GADOBENATE DIMEGLUMINE 529 MG/ML IV SOLN
10.0000 mL | Freq: Once | INTRAVENOUS | Status: AC | PRN
  Administered 2014-06-03: 7 mL via INTRAVENOUS

## 2014-06-03 MED ORDER — OXYCODONE-ACETAMINOPHEN 5-325 MG PO TABS
1.0000 | ORAL_TABLET | ORAL | Status: DC | PRN
  Administered 2014-06-03 – 2014-06-05 (×8): 1 via ORAL
  Filled 2014-06-03 (×8): qty 1

## 2014-06-03 MED ORDER — FAMOTIDINE 20 MG PO TABS
20.0000 mg | ORAL_TABLET | Freq: Every day | ORAL | Status: DC
  Administered 2014-06-04 – 2014-06-05 (×2): 20 mg via ORAL
  Filled 2014-06-03 (×2): qty 1

## 2014-06-03 NOTE — Consult Note (Signed)
Referring Provider: [facility] Primary Care Physician:  [name] Koyanagi, MD Primary Gastroenterologist:  Dr. Deatra Ina  Reason for Consultation:   diarrhea  HPI: Cynthia Bowen is a 69 y.o. female who was admitted yesterday with swelling and erythema of right hand. Pt has also been having diarrhea since 05/27/14.  [name] has a past medical history significant for GERD, OA, anxiety and depression. and COPD. She has also had an extensive surgical history significant for abdominal hysterectomy, stomach surgery, gastric fistula repair, exploratory laparotomy, jejunostomy  among others. She had a colonoscopy on February 4 which showed mild diverticulosis in the transverse colon. She says since then she has been having diarrhea 4-6 times per day, moderate in volume, mushy in consistency. She has no nocturnal stooling. She has not had incontinence. She has no bright red blood per rectum or melena. She does report that several days ago she developed wrist pain. She went to an urgent care facility and was given Rocephin and Augmentin without relief. She presented to the emergency room on the 10th and was admitted for possible cellulitis. Stool for C. difficile was obtained and was found to be positive earlier today. She has had some nausea but very little vomiting. She states her nausea has been present for 4 days and is worse after she eats. She has some intermittent crampy abdominal pain.   Past Medical History  Diagnosis Date  . COPD (chronic obstructive pulmonary disease)   . Pneumonia 12-2011  . GERD (gastroesophageal reflux disease)   . Headache(784.0)   . Arthritis     osteoarthritis  . Allergy   . Depression   . Neuromuscular disorder   . Osteoporosis   . Bronchitis     CURRENTLY AS OF 06/30/12 - HAS COUGH AND FINISHED ANTIBIOTIC FOR BRONCHITIS  . Fibromyalgia   . Pain     ABDOMINAL PAIN AND NAUSEA  . Pain     SOMETIMES PAIN RIGHT EAR AND NECK--STATES CAUSED BY A "LUMP" ON BACK  OF EAR--USES KENALOG CREAM TOPICALLY AS NEEDED.  Marland Kitchen Gastrocutaneous fistula   . Anemia   . Anxiety   . Blood transfusion without reported diagnosis     Past Surgical History  Procedure Laterality Date  . Abdominal hysterectomy    . Esophagogastroduodenoscopy  04/18/2012    Procedure: ESOPHAGOGASTRODUODENOSCOPY (EGD);  Surgeon: Inda Castle, MD;  Location: [facility];  Service: Endoscopy;  Laterality: N/A;  . Eus  05/29/2012    Procedure: UPPER ENDOSCOPIC ULTRASOUND (EUS) LINEAR;  Surgeon: Milus Banister, MD;  Location: [facility];  Service: Endoscopy;  Laterality: N/A;  . Appendectomy    . Spine surgery      CERVICAL SPINE SURGERY X 2 - INCLUDING FUSION; LUMBAR SURGERY FOR RUPTURED DISC  . Eye surgery      BILATERAL CATARACT EXTRACTIONS  . Laparoscopic abdominal exploration N/A 07/02/2012    Procedure: converted to laparotomy with gastric biopsy;  Surgeon: Imogene Burn. Georgette Dover, MD;  Location: [facility];  Service: General;  Laterality: N/A;  Laparoscopic Gastric Biospy attempted.   . Laparotomy N/A 07/07/2012    Procedure: EXPLORATORY LAPAROTOMY repair of gastric perforation with omental graham patch, drainage of abdominal abcess;  Surgeon: Imogene Burn. Georgette Dover, MD;  Location: [facility];  Service: General;  Laterality: N/A;  . Stomach surgery  07/07/2012    Omental patch of gastric perforation  . Laparotomy N/A 07/13/2012    Procedure: EXPLORATORY LAPAROTOMY repair gastric leak;  Surgeon: Edward Jolly, MD;  Location: [facility];  Service: General;  Laterality: N/A;  . Laparotomy N/A 11/11/2012    Procedure: EXPLORATORY LAPAROTOMY;  Surgeon: Imogene Burn. Georgette Dover, MD;  Location: [place];  Service: General;  Laterality: N/A;  . Bowel resection N/A 11/11/2012    Procedure: SMALL BOWEL RESECTION;  Surgeon: Imogene Burn. Georgette Dover, MD;  Location: [place];  Service: General;  Laterality: N/A;  . Minor application of wound vac N/A 11/11/2012    Procedure: APPLICATION OF WOUND VAC;  Surgeon: Imogene Burn. Georgette Dover, MD;   Location[place];  Service: General;  Laterality: N/A;  . Jejunostomy N/A 11/11/2012    Procedure: PLACEMENT OF FEEDING JEJUNOSTOMY TUBE;  Surgeon: Imogene Burn. Georgette Dover, MD;  Location: [place];  Service: General;  Laterality: N/A;  . Lysis of adhesion N/A 11/11/2012    Procedure: LYSIS OF ADHESION;  Surgeon: Imogene Burn. Georgette Dover, MD;  Location: [place];  Service: General;  Laterality: N/A;  . Hernia repair    . Gastric fistula repair      05/16/2013  . Colonoscopy    . Polypectomy    . Upper gastrointestinal endoscopy      Prior to Admission medications   Medication Sig Start Date End Date Taking? Authorizing Provider  albuterol (PROVENTIL HFA;VENTOLIN HFA) 108 (90 BASE) MCG/ACT inhaler Inhale 2 puffs into the lungs every 6 (six) hours as needed for wheezing. 11/17/13  Yes [name] Koyanagi, MD  amoxicillin-clavulanate (AUGMENTIN) 875-125 MG per tablet Take 1 tablet by mouth 2 (two) times daily. 06/01/14  Yes Orma Flaming, MD  CALCIUM PO Take 1 tablet by mouth daily.   Yes Historical Provider, MD  clidinium-chlordiazePOXIDE (LIBRAX) 5-2.5 MG per capsule Take 1 capsule by mouth 4 (four) times daily -  before meals and at bedtime. As needed for abdominal pain 03/15/14  Yes [name] Koyanagi, MD  diphenhydrAMINE (BENADRYL) 12.5 MG/5ML liquid Take 12.5 mg by mouth 4 (four) times daily as needed for itching.    Yes Historical Provider, MD  EPINEPHrine (EPIPEN) 0.3 mg/0.3 mL IJ SOAJ injection Inject 0.3 mLs (0.3 mg total) into the muscle daily as needed (anaphylaxis). 10/07/13  Yes [name] Koyanagi, MD  fluocinonide cream (LIDEX) 7.82 % APPLY 1 APPLICATION TOPICALLY 2 (TWO) TIMES DAILY. Patient taking differently: Apply 1 application topically 2 (two) times daily as needed (rash).  12/09/13  Yes [name] Koyanagi, MD  ketoconazole (NIZORAL) 2 % shampoo Apply 1 application topically 2 (two) times a week. 10/28/13  Yes [name] Koyanagi, MD  Multiple Vitamin (MULTIVITAMIN) tablet Take 1 tablet by mouth daily.   Yes  Historical Provider, MD  omeprazole (PRILOSEC) 40 MG capsule Take 1 capsule (40 mg total) by mouth daily. 05/26/14  Yes Posey Boyer, MD  ondansetron (ZOFRAN) 4 MG tablet TAKE 1 TABLET (4 MG TOTAL) BY MOUTH EVERY 8 (EIGHT) HOURS AS NEEDED FOR NAUSEA OR VOMITING. 05/10/14  Yes [name] Koyanagi, MD  oxyCODONE-acetaminophen (ROXICET) 5-325 MG per tablet Take 1 tablet by mouth every 8 (eight) hours as needed for severe pain. 05/26/14  Yes [name] Koyanagi, MD  polyethylene glycol powder (GLYCOLAX/MIRALAX) powder Take 17 g by mouth daily. 01/28/14  Yes Wardell Honour, MD  potassium chloride SA (K-DUR,KLOR-CON) 20 MEQ tablet Take 1 tablet BID for 2 days, Take 1 tablet once a day for 4 days 05/26/14  Yes Chelle S Jeffery, PA-C  Probiotic Product (PROBIOTIC DAILY PO) Take 1 capsule by mouth daily.   Yes Historical Provider, MD  ranitidine (ZANTAC) 150 MG tablet TAKE 1 TABLET  BY MOUTH TWICE A DAY 04/14/14  Yes [name] Koyanagi, MD  sucralfate (CARAFATE) 1 G tablet Take 1 tablet (1 g total) by mouth 4 (four) times daily -  with meals and at bedtime. 05/26/14  Yes Posey Boyer, MD  Vitamin D, Ergocalciferol, (DRISDOL) 50000 UNITS CAPS capsule Take 1 capsule (50,000 Units total) by mouth every 7 (seven) days. 05/22/14  Yes Robyn Haber, MD  alendronate (FOSAMAX) 70 MG tablet Take 1 tablet (70 mg total) by mouth every 7 (seven) days. Take with a full glass of water on an empty stomach. Patient not taking: Reported on 06/01/2014 05/22/14   Robyn Haber, MD  Calcium Carb-Cholecalciferol (CALCIUM 1000 + D) 1000-800 MG-UNIT TABS Take 1,000 tablets by mouth daily. Patient not taking: Reported on 06/02/2014 05/26/14   Fara Chute, PA-C    Current Facility-Administered Medications  Medication Dose Route Frequency Provider Last Rate Last Dose  . 0.9 %  sodium chloride infusion  250 mL Intravenous PRN Debbe Odea, MD      . alum & mag hydroxide-simeth (MAALOX/MYLANTA) 200-200-20 MG/5ML suspension 30 mL  30 mL Oral  Q6H PRN Debbe Odea, MD      . clidinium-chlordiazePOXIDE (LIBRAX) 5-2.5 MG per capsule 1 capsule  1 capsule Oral TID AC & HS Debbe Odea, MD   1 capsule at 06/02/14 2053  . diphenhydrAMINE (BENADRYL) 12.5 MG/5ML elixir 12.5 mg  12.5 mg Oral QID PRN Debbe Odea, MD      . Derrill Memo ON 06/04/2014] famotidine (PEPCID) tablet 20 mg  20 mg Oral Daily Modena Jansky, MD      . fentaNYL (SUBLIMAZE) injection 50 mcg  50 mcg Intravenous Q2H PRN Debbe Odea, MD      . fluocinonide cream (LIDEX) 1.66 % 1 application  1 application Topical BID PRN Debbe Odea, MD      . ketorolac (TORADOL) 15 MG/ML injection 15 mg  15 mg Intravenous 3 times per day Modena Jansky, MD   15 mg at 06/03/14 0941  . metroNIDAZOLE (FLAGYL) tablet 500 mg  500 mg Oral 3 times per day Modena Jansky, MD      . ondansetron (ZOFRAN) tablet 4 mg  4 mg Oral Q6H PRN Debbe Odea, MD       Or  . ondansetron (ZOFRAN) injection 4 mg  4 mg Intravenous Q6H PRN Debbe Odea, MD      . oxyCODONE-acetaminophen (PERCOCET/ROXICET) 5-325 MG per tablet 1 tablet  1 tablet Oral Q4H PRN Modena Jansky, MD      . pantoprazole (PROTONIX) EC tablet 40 mg  40 mg Oral Daily Debbe Odea, MD   40 mg at 06/03/14 0941  . potassium chloride SA (K-DUR,KLOR-CON) CR tablet 20 mEq  20 mEq Oral BID Debbe Odea, MD   20 mEq at 06/03/14 0941  . sodium chloride 0.9 % injection 3 mL  3 mL Intravenous Q12H Debbe Odea, MD   3 mL at 06/03/14 0941  . sodium chloride 0.9 % injection 3 mL  3 mL Intravenous PRN Debbe Odea, MD      . sucralfate (CARAFATE) tablet 1 g  1 g Oral TID WC & HS Debbe Odea, MD   1 g at 06/03/14 1157  . vancomycin (VANCOCIN) IVPB 750 mg/150 ml premix  750 mg Intravenous Q24H Posey Rea, RPH   750 mg at 06/03/14 1157    Allergies as of 06/02/2014 - Review Complete 06/02/2014  Allergen Reaction Noted  . Avelox [moxifloxacin hcl in  nacl] Nausea And Vomiting 01/04/2012  . Betadine [povidone iodine] Itching and Rash 09/07/2012  .  Alendronate sodium Nausea And Vomiting 06/12/2011  . Alendronate sodium  01/15/2014  . Aspirin Nausea Only 06/12/2011  . Codeine Nausea And Vomiting 06/12/2011  . Doxycycline  06/12/2011  . Fluconazole  06/12/2011  . Hydrocodone Nausea And Vomiting 05/24/2012  . Hydrocodone-acetaminophen Nausea And Vomiting 01/15/2014  . Latex  06/13/2013  . Neurontin [gabapentin] Other (See Comments) 06/12/2011  . Nsaids Other (See Comments) 07/11/2012  . Quinolones Itching and Hives 01/15/2014  . Sertraline hcl Other (See Comments) 06/12/2011  . Sertraline hcl Nausea And Vomiting 01/15/2014  . Sulfamethoxazole Itching and Hives 01/15/2014  . Sulfa antibiotics Rash 06/12/2011    Family History  Problem Relation Age of Onset  . COPD Mother   . Hyperlipidemia Mother   . Hypertension Maternal Grandmother   . Stroke Maternal Grandmother   . Colon cancer Neg Hx     History   Social History  . Marital Status: Single    Spouse Name: N/A  . Number of Children: 0  . Years of Education: N/A   Occupational History  .     Social History Main Topics  . Smoking status: Former Smoker -- 0.50 packs/day for 35 years    Types: Cigarettes    Start date: 01/04/2003    Quit date: 04/17/2005  . Smokeless tobacco: Never Used  . Alcohol Use: No  . Drug Use: No  . Sexual Activity: No   Other Topics Concern  . Not on file   Social History Narrative   Single. Education: [employer]. Exercise.    Review of Systems: Gen: Denies any fever, chills, sweats, anorexia, fatigue, weakness, malaise, weight loss, and sleep disorder CV: Denies chest pain, angina, palpitations, syncope, orthopnea, PND, peripheral edema, and claudication. Resp: Denies dyspnea at rest, dyspnea with exercise, cough, sputum, wheezing, coughing up blood, and pleurisy. GI: Denies vomiting blood, jaundice, and fecal incontinence.   Denies dysphagia or odynophagia. GU : Denies urinary burning, blood in urine, urinary frequency, urinary  hesitancy, nocturnal urination, and urinary incontinence. MS: Has pain and swelling of right hand and wrist.  Derm: Denies rash, itching, dry skin, hives, moles, warts, or unhealing ulcers.  Psych: Denies depression, anxiety, memory loss, suicidal ideation, hallucinations, paranoia, and confusion. Heme: Denies bruising, bleeding, and enlarged lymph nodes. Neuro:  Denies any headaches, dizziness, paresthesias. Endo:  Denies any problems with DM, thyroid, adrenal function.  Physical Exam: Vital signs in last 24 hours: Temp:  [98.1 F (36.7 C)-99.6 F (37.6 C)] 98.1 F (36.7 C) (02/11 0428) Pulse Rate:  [80-94] 80 (02/11 0428) Resp:  [18] 18 (02/11 0428) BP: (132-136)/(62-79) 136/79 mmHg (02/11 0428) SpO2:  [100 %] 100 % (02/11 0428) Weight:  [84 lb 14 oz (38.5 kg)] 84 lb 14 oz (38.5 kg) (02/10 1357) Last BM Date: 06/02/14 General:   Alert,  Well-developed, well-nourished, pleasant and cooperative in NAD Head:  Normocephalic and atraumatic. Eyes:  Sclera clear, no icterus.   Conjunctiva pink. Ears:  Normal auditory acuity. Nose:  No deformity, discharge,  or lesions. Mouth:  No deformity or lesions.   Neck:  Supple; no masses or thyromegaly. Lungs:  Clear throughout to auscultation.   No wheezes, crackles, or rhonchi.  Heart:  Regular rate and rhythm; no murmurs, clicks, rubs,  or gallops. Abdomen:  Soft,nontender, BS active,nonpalp mass or hsm.  Multiple healed surgical incisions Rectal:  Deferred per pt at this visit Msk:  Symmetrical without gross  deformities. . Pulses:  Normal pulses noted. Extremities: Right wrist swollen, erythematous, limited ROM secondary to pain Neurologic:  Alert and  oriented x4;  grossly normal neurologically. Skin:  Intact without significant lesions or rashes.. Psych:  Alert and cooperative. Normal mood and affect.  Intake/Output from previous day: 02/10 0701 - 02/11 0700 In: 940 [P.O.:480; I.V.:10] Out: -  Intake/Output this shift: Total  I/O In: 240 [P.O.:240] Out: -   Lab Results:  Recent Labs  06/02/14 1128 06/03/14 0440  WBC 15.4* 7.8  HGB 12.3 12.2  HCT 36.8 35.2*  PLT 297 287   BMET  Recent Labs  06/02/14 1128 06/02/14 1900 06/03/14 0440  NA 139 136 140  K 2.7* 3.9 3.8  CL 101 103 105  CO2 28 25 26   GLUCOSE 90 136* 91  BUN 7 7 8   CREATININE 0.56 0.70 0.55  CALCIUM 8.8 8.4 8.6   C diff on 06/02/14 positive  ENDOSCOPIES: Colonoscopy 05/27/14:ENDOSCOPIC IMPRESSION: 1. Mild diverticulosis was noted in the transverse colon 2. The examination was otherwise normal RECOMMENDATIONS: Colonoscopy 10 years  IMPRESSION/PlLAN: #1. Diarrhea. C. difficile positive. She currently on by mouth Flagyl and IV vancomycin. Her PPI is on hold due to her C. difficile and she is currently on famotidine. Would add Florastor twice a day.  #2. Hypokalemia. This was likely due to her diarrhea and has normalized.   #3. History gastric perforation with abscess/peritonitis status post omental patch repair 2 in March 2014. She is currently on Pepcid and sucralfate. She has had Toradol ordered for several doses due to her swelling of her right wrist and pain in her right wrist. Would reinstitute PPI as soon as possible.   Zebbie Ace, Deloris Ping 06/03/2014,  Pager [phone]

## 2014-06-03 NOTE — Progress Notes (Signed)
CRITICAL VALUE ALERT  Critical value received:  C diff +  Date of notification:  06/03/14  Time of notification:  6237  Critical value read back:Yes.    Nurse who received alert:  Laural Benes, Rn  MD notified (1st page):  Dr. Algis Liming  Time of first page:  85  MD notified (2nd page):  Time of second page:  Responding MD:  NA  Time MD responded:  NA

## 2014-06-03 NOTE — Progress Notes (Signed)
PROGRESS NOTE    Cynthia Bowen XLK:[id number] DOB: August 07, 1945 DOA: 06/02/2014 PCP: Cynthia Koyanagi, MD  Primary Gastroenterologist: Dr. Erskine Emery  HPI/Brief narrative 69 year old female s/p OP colonoscopy 05/27/14 which showed mild diverticulosis in transverse colon, GERD, COPD, OA, OP, anxiety & depression, presented to ED on 06/02/14 with 2-3 day history of pain and swelling of right wrist/hand. She went to urgent care and received Rocephin and Augmentin without relief. Post colonoscopy, she complained of daily diarrhea 4-5 times. No fevers. She was admitted for possible cellulitis.   Assessment/Plan:  1. Right wrist/distal forearm cellulitis Vs arthritis: Current clinical picture not entirely convincing for cellulitis. More concerned about some form of acute arthritis in right wrist joint. Patient denies history of trauma or gout. Serum uric acid normal at 3.8. Continue empiric IV vancomycin for now. Trial of a very short course of IV Toradol and monitor response. Elevate extremity. Recent x-ray negative for acute findings. If no improvement or if worsening, consider MRI of right wrist. 2. C. difficile diarrhea: Recent colonoscopy without acute findings. C. difficile PCR +. Contact isolation. Started empiric oral Flagyl. [facility] to consult. 3. Hypokalemia: Secondary to diarrhea. Replaced. 4. COPD: Stable. 5. History of gastric perforation, repaired with omental patch 07/07/12: No reported abdominal pain, nausea or vomiting. Will not use NSAIDs for more than a couple of doses. Monitor. GI to assess regarding need for PPIs in the context of C. difficile. 6. History of anxiety and depression   Code Status: Full Family Communication: None at bedside Disposition Plan: Home when medically improved & stable - possibly in 2-3 days.   Consultants:  [facility]  Procedures:  None  Antibiotics:  IV Vancomycin 2/10 >  PO Metronidazole 2/11>   Subjective: Right wrist pain  10/10, worse with movements-no significant improvement since admission. Continued diarrhea. No nausea, vomiting or abdominal pain.  Objective: Filed Vitals:   06/02/14 1357 06/02/14 1958 06/02/14 2026 06/03/14 0428  BP:   132/62 136/79  Pulse: 94 80 80 80  Temp: 99.6 F (37.6 C)  98.2 F (36.8 C) 98.1 F (36.7 C)  TempSrc:   Oral Oral  Resp: 18  18 18   Height: 5\' 5"  (1.651 m)     Weight: 38.5 kg (84 lb 14 oz)     SpO2: 100%  100% 100%    Intake/Output Summary (Last 24 hours) at 06/03/14 1206 Last data filed at 06/03/14 0915  Gross per 24 hour  Intake   1180 ml  Output      0 ml  Net   1180 ml   Filed Weights   06/02/14 1357  Weight: 38.5 kg (84 lb 14 oz)     Exam:  General exam: Pleasant middle-aged female patient, small built and frail, sitting up at edge of bed this morning with painful distress and tearful. Respiratory system: Clear. No increased work of breathing. Cardiovascular system: S1 & S2 heard, RRR. No JVD, murmurs, gallops, clicks or pedal edema. Gastrointestinal system: Abdomen is nondistended, soft and nontender. Normal bowel sounds heard. Central nervous system: Alert and oriented. No focal neurological deficits. Extremities: Symmetric 5 x 5 power. Right wrist mildly swollen, tender, warm, painful range of movements but no erythema. No open wounds. Good radial pulses. Good movements and color of right fingers. Minimal swelling of dorsum of right hand. No fluctuance or crepitus.   Data Reviewed: Basic Metabolic Panel:  Recent Labs Lab 06/02/14 1128 06/02/14 1900 06/03/14 0440  NA 139 136 140  K  2.7* 3.9 3.8  CL 101 103 105  CO2 28 25 26   GLUCOSE 90 136* 91  BUN 7 7 8   CREATININE 0.56 0.70 0.55  CALCIUM 8.8 8.4 8.6   Liver Function Tests: No results for input(s): AST, ALT, ALKPHOS, BILITOT, PROT, ALBUMIN in the last 168 hours. No results for input(s): LIPASE, AMYLASE in the last 168 hours. No results for input(s): AMMONIA in the last 168  hours. CBC:  Recent Labs Lab 06/02/14 1128 06/03/14 0440  WBC 15.4* 7.8  NEUTROABS 13.0*  --   HGB 12.3 12.2  HCT 36.8 35.2*  MCV 86.2 85.4  PLT 297 287   Cardiac Enzymes: No results for input(s): CKTOTAL, CKMB, CKMBINDEX, TROPONINI in the last 168 hours. BNP (last 3 results) No results for input(s): PROBNP in the last 8760 hours. CBG: No results for input(s): GLUCAP in the last 168 hours.  Recent Results (from the past 240 hour(s))  Clostridium Difficile by PCR     Status: Abnormal   Collection Time: 06/02/14  3:34 PM  Result Value Ref Range Status   C difficile by pcr POSITIVE (A) NEGATIVE Final    Comment: CRITICAL RESULT CALLED TO, READ BACK BY AND VERIFIED WITH: Wilmer Floor RN 11:35 06/03/14 ([email or web address]) Performed at [facility]         Studies: No results found.      Scheduled Meds: . clidinium-chlordiazePOXIDE  1 capsule Oral TID AC & HS  . [START ON 06/04/2014] famotidine  20 mg Oral Daily  . ketorolac  15 mg Intravenous 3 times per day  . metroNIDAZOLE  500 mg Oral 3 times per day  . pantoprazole  40 mg Oral Daily  . polyethylene glycol  17 g Oral Daily  . potassium chloride SA  20 mEq Oral BID  . sodium chloride  3 mL Intravenous Q12H  . sucralfate  1 g Oral TID WC & HS  . vancomycin  750 mg Intravenous Q24H   Continuous Infusions:   Principal Problem:   Cellulitis of hand Active Problems:   Depression with anxiety   COPD GOLD I   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 [id number]   Hypokalemia    Time spent: 66 minutes    Crysten Kaman, MD, FACP, FHM. [facility] Pager [phone]  If 7PM-7AM, please contact night-coverage [email or web address] Password [email or web address] 06/03/2014, 12:06 PM    LOS: 1 day

## 2014-06-03 NOTE — Telephone Encounter (Signed)
I left a detailed message letting pt know to RTC and to call me back if she needed to.

## 2014-06-04 DIAGNOSIS — E43 Unspecified severe protein-calorie malnutrition: Secondary | ICD-10-CM | POA: Insufficient documentation

## 2014-06-04 LAB — BASIC METABOLIC PANEL
ANION GAP: 7 (ref 5–15)
BUN: 8 mg/dL (ref 6–23)
CALCIUM: 8.8 mg/dL (ref 8.4–10.5)
CO2: 25 mmol/L (ref 19–32)
Chloride: 104 mmol/L (ref 96–112)
Creatinine, Ser: 0.61 mg/dL (ref 0.50–1.10)
GFR calc non Af Amer: >90 mL/min (ref >90)
Glucose, Bld: 82 mg/dL (ref 70–99)
Potassium: 3.7 mmol/L (ref 3.5–5.1)
Sodium: 136 mmol/L (ref 135–145)

## 2014-06-04 LAB — CBC
HEMATOCRIT: 33.4 % — AB (ref 36.0–46.0)
HEMOGLOBIN: 11 g/dL — AB (ref 12.0–15.0)
MCH: 28.6 pg (ref 26.0–34.0)
MCHC: 32.9 g/dL (ref 30.0–36.0)
MCV: 86.8 fL (ref 78.0–100.0)
Platelets: 292 10*3/uL (ref 150–400)
RBC: 3.85 MIL/uL — ABNORMAL LOW (ref 3.87–5.11)
RDW: 16.1 % — ABNORMAL HIGH (ref 11.5–15.5)
WBC: 5.8 10*3/uL (ref 4.0–10.5)

## 2014-06-04 LAB — SEDIMENTATION RATE: Sed Rate: 54 mm/hr — ABNORMAL HIGH (ref 0–22)

## 2014-06-04 LAB — C-REACTIVE PROTEIN: CRP: 4 mg/dL — AB (ref <0.60)

## 2014-06-04 MED ORDER — PREDNISONE 20 MG PO TABS
30.0000 mg | ORAL_TABLET | Freq: Every day | ORAL | Status: DC
  Administered 2014-06-04 – 2014-06-05 (×2): 30 mg via ORAL
  Filled 2014-06-04 (×3): qty 1

## 2014-06-04 MED ORDER — BOOST / RESOURCE BREEZE PO LIQD
1.0000 | Freq: Three times a day (TID) | ORAL | Status: DC
Start: 2014-06-04 — End: 2014-06-05
  Administered 2014-06-04 – 2014-06-05 (×4): 1 via ORAL

## 2014-06-04 MED ORDER — PANTOPRAZOLE SODIUM 40 MG PO TBEC
40.0000 mg | DELAYED_RELEASE_TABLET | Freq: Every day | ORAL | Status: DC
  Administered 2014-06-04 – 2014-06-05 (×2): 40 mg via ORAL
  Filled 2014-06-04: qty 1

## 2014-06-04 NOTE — Progress Notes (Signed)
[facility] Progress Note  Subjective:   Less abd discomfort. On flagyl for c diff. No diarrhea today. Wants to go home. Right wrist still hurts.   Objective:  Vital signs in last 24 hours: Temp:  [98 F (36.7 C)-98.3 F (36.8 C)] 98 F (36.7 C) (02/12 0458) Pulse Rate:  [68-76] 72 (02/12 0458) Resp:  [16-18] 16 (02/12 0458) BP: (122-163)/(66-84) 122/66 mmHg (02/12 0458) SpO2:  [100 %] 100 % (02/12 0458) Last BM Date: 06/03/14 General:   Alert,  Well-developed,    in NAD Heart:  Regular rate and rhythm; no murmurs Pulm;lungs clear Abdomen:  Soft, nontender and nondistended. Normal bowel sounds, without guarding, and without rebound.   Extremities:  Without edema. Neurologic:  Alert and  oriented x4;  grossly normal neurologically. Psych:  Alert and cooperative. Normal mood and affect.  Intake/Output from previous day: 02/11 0701 - 02/12 0700 In: 5993 [P.O.:1590; I.V.:30; IV Piggyback:150] Out: -  Intake/Output this shift:    Lab Results:  Recent Labs  06/02/14 1128 06/03/14 0440 06/04/14 0421  WBC 15.4* 7.8 5.8  HGB 12.3 12.2 11.0*  HCT 36.8 35.2* 33.4*  PLT 297 287 292   BMET  Recent Labs  06/02/14 1900 06/03/14 0440 06/04/14 0421  NA 136 140 136  K 3.9 3.8 3.7  CL 103 105 104  CO2 25 26 25   GLUCOSE 136* 91 82  BUN 7 8 8   CREATININE 0.70 0.55 0.61  CALCIUM 8.4 8.6 8.8    Mr Hand Right W Wo Contrast  06/03/2014   CLINICAL DATA:  Swelling of the distal right forearm and right hand through the fingers beginning 06/01/2014 with some redness.  EXAM: MRI OF THE RIGHT HAND WITHOUT AND WITH CONTRAST; MRI OF THE RIGHT FOREARM WITHOUT AND WITH CONTRAST  TECHNIQUE: Multiplanar, multisequence MR imaging was performed both before and after administration of intravenous contrast.  CONTRAST:  7 mL MULTIHANCE GADOBENATE DIMEGLUMINE 529 MG/ML IV SOLN  COMPARISON:  None.  FINDINGS: Subcutaneous edema is seen about the distal forearm and wrist. No focal  fluid collection is identified. Synovium about the carpus demonstrates increased prominence and postcontrast enhancement consistent with synovitis. Similar changes are seen in the distal radioulnar joint. There is some soft tissue enhancement about the flexor and extensor tendons but no notable fluid is identified. No erosion is identified. There is no bone marrow signal abnormality to suggest osteomyelitis. Joint spaces and alignment appear maintained. All visualized tendons are intact. No focal bony lesion is identified.  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 Infection is possible but much less likely.  Findings discussed with Dr.  Algis Liming at the time interpretation.   Electronically Signed   By: Inge Rise M.D.   On: 06/03/2014 15:37   Mr Forearm Right Wo/w Cm  06/03/2014   CLINICAL DATA:  Swelling of the distal right forearm and right hand through the fingers beginning 06/01/2014 with some redness.  EXAM: MRI OF THE RIGHT HAND WITHOUT AND WITH CONTRAST; MRI OF THE RIGHT FOREARM WITHOUT AND WITH CONTRAST  TECHNIQUE: Multiplanar, multisequence MR imaging was performed both before and after administration of intravenous contrast.  CONTRAST:  7 mL MULTIHANCE GADOBENATE DIMEGLUMINE 529 MG/ML IV SOLN  COMPARISON:  None.  FINDINGS: Subcutaneous edema is seen about the distal forearm and wrist. No focal fluid collection is identified. Synovium about the carpus demonstrates increased  prominence and postcontrast enhancement consistent with synovitis. Similar changes are seen in the distal radioulnar joint. There is some soft tissue enhancement about the flexor and extensor tendons but no notable fluid is identified. No erosion is identified. There is no bone marrow signal abnormality to suggest osteomyelitis. Joint spaces and alignment appear maintained. All visualized  tendons are intact. No focal bony lesion is identified.  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 Infection is possible but much less likely.  Findings discussed with Dr.  Algis Liming at the time interpretation.   Electronically Signed   By: Inge Rise M.D.   On: 06/03/2014 15:37    ASSESSMENT/PLAN:  #1. Diarrhea. C. difficile positive. She currently on by mouth Flagyl and IV vancomycin. Her PPI is on hold due to her C. difficile and she is currently on famotidine. On florastor as well/ From GI standpoint, pt can  Complete 10-14 days course of flagyl 500 tid as an outpatient. Should continue florastor 250 bid x 28 days. Pt should continue famotidine. If pt started on prednisone for tenosynovitis, would restart PPI., otherwise would re start PPI  as soon as tests neg for c diff post treatment.Pt will follow up in 2-3 weeks in GI office.      LOS: 2 days   Brach Birdsall, Vita Barley PA-C 06/04/2014, Pager [phone]

## 2014-06-04 NOTE — Plan of Care (Signed)
Problem: Phase III Progression Outcomes Goal: Pain controlled on oral analgesia Outcome: Progressing Patient reports pain has improved since am. Wrist swelling has decreased as well.

## 2014-06-04 NOTE — Progress Notes (Signed)
INITIAL NUTRITION ASSESSMENT  DOCUMENTATION CODES Per approved criteria  -Severe malnutrition in the context of chronic illness -Underweight  Pt meets criteria for severe MALNUTRITION in the context of chronic illness as evidenced by severe muscle and fat depletion.  INTERVENTION: Provide Resource Breeze po TID, each supplement provides 250 kcal and 9 grams of protein Encourage PO intake RD to continue to monitor  NUTRITION DIAGNOSIS: Underweight related to s/p gastric surgery as evidenced by BMI of 14.2.   Goal: Pt to meet >/= 90% of their estimated nutrition needs   Monitor:  PO and supplemental intake, weight, labs, I/O's  Reason for Assessment: Consult for nutritional assessment  Admitting Dx: Cellulitis of hand  ASSESSMENT: 69 y.o. female with PMH of GERD, COPD presenting with swelling of right hand and pain. Pain started 2 days ago and swelling started yesterday.  Pt and family in room report pt is eating well. PO intake: 90%.  Pt's weight has fluctuated between 84-90 lb.  Pt with history of having a j-tube which was removed. Pt has had diarrhea since 2/4, and nausea x 4 days. Pt had gastric surgery in March 2014 which was when her weight started declining.  Pt reports not liking Ensure/Boost supplements d/t lactose intolerance. Pt would like to try Resource Breeze supplements. RD to order.  Nutrition Focused Physical Exam:  Subcutaneous Fat:  Orbital Region: mild depletion Upper Arm Region: severe depletion Thoracic and Lumbar Region: NA  Muscle:  Temple Region: moderate depletion Clavicle Bone Region: severe depletion Clavicle and Acromion Bone Region: severe depletion Scapular Bone Region: severe depletion Dorsal Hand: severe depletion Patellar Region: NA Anterior Thigh Region: NA Posterior Calf Region: NA  Edema: RUE  Labs reviewed: WNL  Height: Ht Readings from Last 1 Encounters:  06/02/14 5\' 5"  (1.651 m)    Weight: Wt Readings from Last 1  Encounters:  06/02/14 84 lb 14 oz (38.5 kg)    Ideal Body Weight:  125 lb  % Ideal Body Weight: 67%  Wt Readings from Last 10 Encounters:  06/02/14 84 lb 14 oz (38.5 kg)  06/02/14 89 lb (40.37 kg)  06/01/14 89 lb 9.6 oz (40.642 kg)  05/27/14 89 lb (40.37 kg)  05/26/14 90 lb (40.824 kg)  05/22/14 84 lb 6.4 oz (38.284 kg)  05/13/14 89 lb 6.4 oz (40.552 kg)  04/30/14 86 lb 14.4 oz (39.418 kg)  03/15/14 84 lb 9.6 oz (38.374 kg)  03/01/14 83 lb (37.649 kg)    Usual Body Weight: 97 lb  % Usual Body Weight: 87%  BMI:  Body mass index is 14.12 kg/(m^2).  Estimated Nutritional Needs: Kcal: 1400-1600 Protein: 55-65g Fluid: 1.5L/day  Skin: cellulitis of right hand  Diet Order: Diet regular  EDUCATION NEEDS: -No education needs identified at this time   Intake/Output Summary (Last 24 hours) at 06/04/14 1058 Last data filed at 06/04/14 0619  Gross per 24 hour  Intake   1530 ml  Output      0 ml  Net   1530 ml    Last BM: 2/11  Labs:   Recent Labs Lab 06/02/14 1900 06/03/14 0440 06/04/14 0421  NA 136 140 136  K 3.9 3.8 3.7  CL 103 105 104  CO2 25 26 25   BUN 7 8 8   CREATININE 0.70 0.55 0.61  CALCIUM 8.4 8.6 8.8  GLUCOSE 136* 91 82    CBG (last 3)  No results for input(s): GLUCAP in the last 72 hours.  Scheduled Meds: . clidinium-chlordiazePOXIDE  1 capsule  Oral TID AC & HS  . famotidine  20 mg Oral Daily  . ketorolac  15 mg Intravenous 3 times per day  . metroNIDAZOLE  500 mg Oral 3 times per day  . potassium chloride SA  20 mEq Oral BID  . predniSONE  30 mg Oral Q breakfast  . saccharomyces boulardii  250 mg Oral BID  . sodium chloride  3 mL Intravenous Q12H  . sucralfate  1 g Oral TID WC & HS    Continuous Infusions:   Past Medical History  Diagnosis Date  . COPD (chronic obstructive pulmonary disease)   . Pneumonia 12-2011  . GERD (gastroesophageal reflux disease)   . Headache(784.0)   . Arthritis     osteoarthritis  . Allergy   .  Depression   . Neuromuscular disorder   . Osteoporosis   . Bronchitis     CURRENTLY AS OF 06/30/12 - HAS COUGH AND FINISHED ANTIBIOTIC FOR BRONCHITIS  . Fibromyalgia   . Pain     ABDOMINAL PAIN AND NAUSEA  . Pain     SOMETIMES PAIN RIGHT EAR AND NECK--STATES CAUSED BY A "LUMP" ON BACK OF EAR--USES KENALOG CREAM TOPICALLY AS NEEDED.  Marland Kitchen Gastrocutaneous fistula   . Anemia   . Anxiety   . Blood transfusion without reported diagnosis     Past Surgical History  Procedure Laterality Date  . Abdominal hysterectomy    . Esophagogastroduodenoscopy  04/18/2012    Procedure: ESOPHAGOGASTRODUODENOSCOPY (EGD);  Surgeon: Inda Castle, MD;  Location: [facility];  Service: Endoscopy;  Laterality: N/A;  . Eus  05/29/2012    Procedure: UPPER ENDOSCOPIC ULTRASOUND (EUS) LINEAR;  Surgeon: Milus Banister, MD;  Location: [facility];  Service: Endoscopy;  Laterality: N/A;  . Appendectomy    . Spine surgery      CERVICAL SPINE SURGERY X 2 - INCLUDING FUSION; LUMBAR SURGERY FOR RUPTURED DISC  . Eye surgery      BILATERAL CATARACT EXTRACTIONS  . Laparoscopic abdominal exploration N/A 07/02/2012    Procedure: converted to laparotomy with gastric biopsy;  Surgeon: Imogene Burn. Georgette Dover, MD;  Location: [facility];  Service: General;  Laterality: N/A;  Laparoscopic Gastric Biospy attempted.   . Laparotomy N/A 07/07/2012    Procedure: EXPLORATORY LAPAROTOMY repair of gastric perforation with omental graham patch, drainage of abdominal abcess;  Surgeon: Imogene Burn. Georgette Dover, MD;  Location: [facility];  Service: General;  Laterality: N/A;  . Stomach surgery  07/07/2012    Omental patch of gastric perforation  . Laparotomy N/A 07/13/2012    Procedure: EXPLORATORY LAPAROTOMY repair gastric leak;  Surgeon: Edward Jolly, MD;  Location: [facility];  Service: General;  Laterality: N/A;  . Laparotomy N/A 11/11/2012    Procedure: EXPLORATORY LAPAROTOMY;  Surgeon: Imogene Burn. Georgette Dover, MD;  Location: [facility];  Service: General;  Laterality:  N/A;  . Bowel resection N/A 11/11/2012    Procedure: SMALL BOWEL RESECTION;  Surgeon: Imogene Burn. Georgette Dover, MD;  Location: [facility];  Service: General;  Laterality: N/A;  . Minor application of wound vac N/A 11/11/2012    Procedure: APPLICATION OF WOUND VAC;  Surgeon: Imogene Burn. Georgette Dover, MD;  Location: [facility];  Service: General;  Laterality: N/A;  . Jejunostomy N/A 11/11/2012    Procedure: PLACEMENT OF FEEDING JEJUNOSTOMY TUBE;  Surgeon: Imogene Burn. Georgette Dover, MD;  Location: [facility];  Service: General;  Laterality: N/A;  . Lysis of adhesion N/A 11/11/2012    Procedure: LYSIS OF ADHESION;  Surgeon: Imogene Burn. Tsuei, MD;  Location: [facility];  Service: General;  Laterality: N/A;  . Hernia repair    . Gastric fistula repair      05/16/2013  . Colonoscopy    . Polypectomy    . Upper gastrointestinal endoscopy      Clayton Bibles, MS, RD, LDN Pager: [phone] After Hours Pager: [phone]

## 2014-06-04 NOTE — Progress Notes (Signed)
PROGRESS NOTE    Cynthia Bowen:[id number] DOB: [birth year] DOA: 06/02/2014 PCP: [name] Koyanagi, MD  Primary Gastroenterologist: Dr. Erskine Emery  HPI/Brief narrative 69 year old female s/p OP colonoscopy 05/27/14 which showed mild diverticulosis in transverse colon, GERD, COPD, OA, OP, anxiety & depression, presented to ED on 06/02/14 with 2-3 day history of pain and swelling of right wrist/hand. She went to urgent care and received Rocephin and Augmentin without relief. Post colonoscopy, she complained of daily diarrhea 4-5 times. No fevers. She was admitted for possible cellulitis.   Assessment/Plan:  1. Right wrist tenosynovitis/arthritis: Current clinical picture was not convincing for cellulitis. More concerned about some form of acute arthritis in right wrist joint. Patient denies history of trauma or gout. Serum uric acid normal at 3.8. Obtain MRI right wrist and discussed findings with radiology on 2/11: Low index of suspicion for infectious process and more suggestive of arthritis. Good response to IV ketorolac >hesitant to continue using secondary to history of gastric perforation. We'll start PO prednisone 30 MG. Check ESR, CRP, ANA & rheumatoid factor. May need outpatient rheumatology consultation & follow-up 2. C. difficile diarrhea: Recent colonoscopy without acute findings. C. difficile PCR +. Contact isolation. Started empiric oral Flagyl. No further diarrhea. Continue metronidazole and probiotics. PPI discontinued by GI. GI input appreciated. 3. Hypokalemia: Secondary to diarrhea. Replaced. 4. COPD: Stable. 5. History of gastric perforation, repaired with omental patch 07/07/12: No reported abdominal pain, nausea or vomiting.  6. History of anxiety and depression 7. Malnutrition/failure to thrive: Nutrition consultation. History of small bowel resection for infarcted bowel. 8. Anemia: Follow CBC in a.m.   Code Status: Full Family Communication: None at  bedside Disposition Plan: Home possibly 06/05/14   Consultants:  [facility]  Procedures:  None  Antibiotics:  IV Vancomycin 2/10 > 2/12  PO Metronidazole 2/11>   Subjective: Right wrist pain has definitely improved. Formed stools yesterday morning and no BM since. Denies nausea, vomiting or abdominal pain.  Objective: Filed Vitals:   06/03/14 1603 06/03/14 2006 06/03/14 2204 06/04/14 0458  BP: 144/67  163/84 122/66  Pulse: 76 68 75 72  Temp: 98.3 F (36.8 C)  98.1 F (36.7 C) 98 F (36.7 C)  TempSrc: Oral  Oral Oral  Resp: _0 Height:      Weight:      SpO2: 100%  100% 100%    Intake/Output Summary (Last 24 hours) at 06/04/14 1036 Last data filed at 06/04/14 3762  Gross per 24 hour  Intake   1530 ml  Output      0 ml  Net   1530 ml   Filed Weights   06/02/14 1357  Weight: 38.5 kg (84 lb 14 oz)     Exam:  General exam: Pleasant middle-aged female patient, small built and frail, sitting up at edge of bed this morning and appears comfortable. Respiratory system: Clear. No increased work of breathing. Cardiovascular system: S1 & S2 heard, RRR. No JVD, murmurs, gallops, clicks or pedal edema. Gastrointestinal system: Abdomen is nondistended, soft and nontender. Normal bowel sounds heard. Central nervous system: Alert and oriented. No focal neurological deficits. Extremities: Symmetric 5 x 5 power. Right wrist findings significantly improved-minimal swelling and tenderness. Able to move much better than yesterday. No other acute signs.  Data Reviewed: Basic Metabolic Panel:  Recent Labs Lab 06/02/14 1128 06/02/14 1900 06/03/14 0440 06/04/14 0421  NA 139 136 140 136  K 2.7* 3.9 3.8 3.7  CL 101 103  105 104  CO2 _0 GLUCOSE 90 136* 91 82  BUN _1 CREATININE 0.56 0.70 0.55 0.61  CALCIUM 8.8 8.4 8.6 8.8   Liver Function Tests: No results for input(s): AST, ALT, ALKPHOS, BILITOT, PROT, ALBUMIN in the last 168 hours. No results for  input(s): LIPASE, AMYLASE in the last 168 hours. No results for input(s): AMMONIA in the last 168 hours. CBC:  Recent Labs Lab 06/02/14 1128 06/03/14 0440 06/04/14 0421  WBC 15.4* 7.8 5.8  NEUTROABS 13.0*  --   --   HGB 12.3 12.2 11.0*  HCT 36.8 35.2* 33.4*  MCV 86.2 85.4 86.8  PLT 297 287 292   Cardiac Enzymes: No results for input(s): CKTOTAL, CKMB, CKMBINDEX, TROPONINI in the last 168 hours. BNP (last 3 results) No results for input(s): PROBNP in the last 8760 hours. CBG: No results for input(s): GLUCAP in the last 168 hours.  Recent Results (from the past 240 hour(s))  Clostridium Difficile by PCR     Status: Abnormal   Collection Time: 06/02/14  3:34 PM  Result Value Ref Range Status   C difficile by pcr POSITIVE (A) NEGATIVE Final    Comment: CRITICAL RESULT CALLED TO, READ BACK BY AND VERIFIED WITH: Wilmer Floor RN 11:35 06/03/14 ([email or web address]) Performed at [facility]         Studies: Mr Hand Right W Wo Contrast  06/03/2014   CLINICAL DATA:  Swelling of the distal right forearm and right hand through the fingers beginning 06/01/2014 with some redness.  EXAM: MRI OF THE RIGHT HAND WITHOUT AND WITH CONTRAST; MRI OF THE RIGHT FOREARM WITHOUT AND WITH CONTRAST  TECHNIQUE: Multiplanar, multisequence MR imaging was performed both before and after administration of intravenous contrast.  CONTRAST:  7 mL MULTIHANCE GADOBENATE DIMEGLUMINE 529 MG/ML IV SOLN  COMPARISON:  None.  FINDINGS: Subcutaneous edema is seen about the distal forearm and wrist. No focal fluid collection is identified. Synovium about the carpus demonstrates increased prominence and postcontrast enhancement consistent with synovitis. Similar changes are seen in the distal radioulnar joint. There is some soft tissue enhancement about the flexor and extensor tendons but no notable fluid is identified. No erosion is identified. There is no bone marrow signal abnormality to suggest osteomyelitis. Joint spaces and  alignment appear maintained. All visualized tendons are intact. No focal bony lesion is identified.  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 Infection is possible but much less likely.  Findings discussed with Dr.  Algis Liming at the time interpretation.   Electronically Signed   By: Inge Rise M.D.   On: 06/03/2014 15:37   Mr Forearm Right Wo/w Cm  06/03/2014   CLINICAL DATA:  Swelling of the distal right forearm and right hand through the fingers beginning 06/01/2014 with some redness.  EXAM: MRI OF THE RIGHT HAND WITHOUT AND WITH CONTRAST; MRI OF THE RIGHT FOREARM WITHOUT AND WITH CONTRAST  TECHNIQUE: Multiplanar, multisequence MR imaging was performed both before and after administration of intravenous contrast.  CONTRAST:  7 mL MULTIHANCE GADOBENATE DIMEGLUMINE 529 MG/ML IV SOLN  COMPARISON:  None.  FINDINGS: Subcutaneous edema is seen about the distal forearm and wrist. No focal fluid collection is identified. Synovium about the carpus demonstrates increased prominence and postcontrast enhancement consistent with synovitis. Similar changes are seen in the distal radioulnar joint. There is some soft tissue enhancement  about the flexor and extensor tendons but no notable fluid is identified. No erosion is identified. There is no bone marrow signal abnormality to suggest osteomyelitis. Joint spaces and alignment appear maintained. All visualized tendons are intact. No focal bony lesion is identified.  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 Infection is possible but much less likely.  Findings discussed with Dr.  Algis Liming at the time interpretation.   Electronically Signed   By: Inge Rise M.D.   On: 06/03/2014 15:37         Scheduled Meds: . clidinium-chlordiazePOXIDE  1 capsule Oral TID AC & HS  . famotidine  20 mg Oral Daily  . ketorolac  15 mg Intravenous 3 times per day  . metroNIDAZOLE  500 mg Oral 3 times per day  . potassium chloride SA  20 mEq Oral BID  . predniSONE  30 mg Oral Q breakfast  . saccharomyces boulardii  250 mg Oral BID  . sodium chloride  3 mL Intravenous Q12H  . sucralfate  1 g Oral TID WC & HS   Continuous Infusions:   Principal Problem:   Cellulitis of hand Active Problems:   Depression with anxiety   COPD GOLD I   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 [id number]   Hypokalemia   Clostridium difficile diarrhea    Time spent: 82 minutes    Jazara Swiney, MD, FACP, FHM. [facility] Pager [phone]  If 7PM-7AM, please contact night-coverage [email or web address] Password [facility] 06/04/2014, 10:36 AM    LOS: 2 days

## 2014-06-04 NOTE — Plan of Care (Signed)
Problem: Phase II Progression Outcomes Goal: Progress activity as tolerated unless otherwise ordered Outcome: Completed/Met Date Met:  06/04/14 Up in room on own         

## 2014-06-04 NOTE — Care Management Note (Signed)
CARE MANAGEMENT NOTE 06/04/2014  Patient:  Cynthia Bowen, Cynthia Bowen   Account Number:  [id number]  Date Initiated:  06/04/2014  Documentation initiated by:  Marney Doctor  Subjective/Objective Assessment:   69 yo admitted with Cellulitis of the hand     Action/Plan:   From home with children   Anticipated DC Date:  06/06/2014   Anticipated DC Plan:  [place]  CM consult      Choice offered to / List presented to:             Status of service:  In process, will continue to follow Medicare Important Message given?   (If response is "NO", the following Medicare IM given date fields will be blank) Date Medicare IM given:   Medicare IM given by:   Date Additional Medicare IM given:   Additional Medicare IM given by:    Discharge Disposition:    Per UR Regulation:  Reviewed for med. necessity/level of care/duration of stay  If discussed at [place] of Stay Meetings, dates discussed:    Comments:  06/04/14 Marney Doctor RN,BSN,NCM [phone] Chart reviewed and no CM needs identified at this time.  CM will continue to follow.

## 2014-06-04 NOTE — Progress Notes (Signed)
Dear Doctor: Algis Liming This patient has been identified as a candidate for PICC for the following reason (s): drug pH or osmolality (causing phlebitis, infiltration in 24 hours) If you agree, please write an order for the indicated device. For any questions contact the Vascular Access Team at [phone] if no answer, please leave a message.  Thank you for supporting the early vascular access assessment program.

## 2014-06-05 LAB — CBC
HEMATOCRIT: 34.1 % — AB (ref 36.0–46.0)
Hemoglobin: 11.4 g/dL — ABNORMAL LOW (ref 12.0–15.0)
MCH: 28.7 pg (ref 26.0–34.0)
MCHC: 33.4 g/dL (ref 30.0–36.0)
MCV: 85.9 fL (ref 78.0–100.0)
Platelets: 317 10*3/uL (ref 150–400)
RBC: 3.97 MIL/uL (ref 3.87–5.11)
RDW: 15.9 % — ABNORMAL HIGH (ref 11.5–15.5)
WBC: 6 10*3/uL (ref 4.0–10.5)

## 2014-06-05 LAB — RHEUMATOID FACTOR: Rhuematoid fact SerPl-aCnc: 16.7 IU/mL — ABNORMAL HIGH (ref 0.0–13.9)

## 2014-06-05 MED ORDER — BOOST / RESOURCE BREEZE PO LIQD: 1.0000 | Freq: Three times a day (TID) | ORAL | Status: AC

## 2014-06-05 MED ORDER — METRONIDAZOLE 500 MG PO TABS: 500.0000 mg | ORAL_TABLET | Freq: Three times a day (TID) | ORAL | Status: DC

## 2014-06-05 MED ORDER — SACCHAROMYCES BOULARDII 250 MG PO CAPS: 250.0000 mg | ORAL_CAPSULE | Freq: Two times a day (BID) | ORAL | Status: DC

## 2014-06-05 MED ORDER — CILIDINIUM-CHLORDIAZEPOXIDE 2.5-5 MG PO CAPS
1.0000 | ORAL_CAPSULE | Freq: Three times a day (TID) | ORAL | Status: DC
  Administered 2014-06-05 (×2): 1 via ORAL
  Filled 2014-06-05 (×5): qty 1

## 2014-06-05 MED ORDER — PREDNISONE 10 MG PO TABS: ORAL_TABLET | ORAL | Status: DC

## 2014-06-05 MED ORDER — FLUOCINONIDE 0.05 % EX CREA: 1.0000 "application " | TOPICAL_CREAM | Freq: Two times a day (BID) | CUTANEOUS | Status: DC | PRN

## 2014-06-05 NOTE — Discharge Instructions (Signed)
Arthritis, Nonspecific °Arthritis is inflammation of a joint. This usually means pain, redness, warmth or swelling are present. One or more joints may be involved. There are a number of types of arthritis. Your caregiver may not be able to tell what type of arthritis you have right away. °CAUSES  °The most common cause of arthritis is the wear and tear on the joint (osteoarthritis). This causes damage to the cartilage, which can break down over time. The knees, hips, back and neck are most often affected by this type of arthritis. °Other types of arthritis and common causes of joint pain include: °· Sprains and other injuries near the joint. Sometimes minor sprains and injuries cause pain and swelling that develop hours later. °· Rheumatoid arthritis. This affects hands, feet and knees. It usually affects both sides of your body at the same time. It is often associated with chronic ailments, fever, weight loss and general weakness. °· Crystal arthritis. Gout and pseudo gout can cause occasional acute severe pain, redness and swelling in the foot, ankle, or knee. °· Infectious arthritis. Bacteria can get into a joint through a break in overlying skin. This can cause infection of the joint. Bacteria and viruses can also spread through the blood and affect your joints. °· Drug, infectious and allergy reactions. Sometimes joints can become mildly painful and slightly swollen with these types of illnesses. °SYMPTOMS  °· Pain is the main symptom. °· Your joint or joints can also be red, swollen and warm or hot to the touch. °· You may have a fever with certain types of arthritis, or even feel overall ill. °· The joint with arthritis will hurt with movement. Stiffness is present with some types of arthritis. °DIAGNOSIS  °Your caregiver will suspect arthritis based on your description of your symptoms and on your exam. Testing may be needed to find the type of arthritis: °· Blood and sometimes urine tests. °· X-ray tests  and sometimes CT or MRI scans. °· Removal of fluid from the joint (arthrocentesis) is done to check for bacteria, crystals or other causes. Your caregiver (or a specialist) will numb the area over the joint with a local anesthetic, and use a needle to remove joint fluid for examination. This procedure is only minimally uncomfortable. °· Even with these tests, your caregiver may not be able to tell what kind of arthritis you have. Consultation with a specialist (rheumatologist) may be helpful. °TREATMENT  °Your caregiver will discuss with you treatment specific to your type of arthritis. If the specific type cannot be determined, then the following general recommendations may apply. °Treatment of severe joint pain includes: °· Rest. °· Elevation. °· Anti-inflammatory medication (for example, ibuprofen) may be prescribed. Avoiding activities that cause increased pain. °· Only take over-the-counter or prescription medicines for pain and discomfort as recommended by your caregiver. °· Cold packs over an inflamed joint may be used for 10 to 15 minutes every hour. Hot packs sometimes feel better, but do not use overnight. Do not use hot packs if you are diabetic without your caregiver's permission. °· A cortisone shot into arthritic joints may help reduce pain and swelling. °· Any acute arthritis that gets worse over the next 1 to 2 days needs to be looked at to be sure there is no joint infection. °Long-term arthritis treatment involves modifying activities and lifestyle to reduce joint stress jarring. This can include weight loss. Also, exercise is needed to nourish the joint cartilage and remove waste. This helps keep the muscles   around the joint strong. HOME CARE INSTRUCTIONS   Do not take aspirin to relieve pain if gout is suspected. This elevates uric acid levels.  Only take over-the-counter or prescription medicines for pain, discomfort or fever as directed by your caregiver.  Rest the joint as much as  possible.  If your joint is swollen, keep it elevated.  Use crutches if the painful joint is in your leg.  Drinking plenty of fluids may help for certain types of arthritis.  Follow your caregiver's dietary instructions.  Try low-impact exercise such as:  Swimming.  Water aerobics.  Biking.  Walking.  Morning stiffness is often relieved by a warm shower.  Put your joints through regular range-of-motion. SEEK MEDICAL CARE IF:   You do not feel better in 24 hours or are getting worse.  You have side effects to medications, or are not getting better with treatment. SEEK IMMEDIATE MEDICAL CARE IF:   You have a fever.  You develop severe joint pain, swelling or redness.  Many joints are involved and become painful and swollen.  There is severe back pain and/or leg weakness.  You have loss of bowel or bladder control. Document Released: 05/17/2004 Document Revised: 07/02/2011 Document Reviewed: 06/02/2008 [facility] Patient Information 2015 [place], [place]. This information is not intended to replace advice given to you by your health care provider. Make sure you discuss any questions you have with your health care provider.  Clostridium Difficile Infection Clostridium difficile (C. difficile) is a bacteria found in the intestinal tract or colon. Under certain conditions, it causes diarrhea and sometimes severe disease. The severe form of the disease is known as pseudomembranous colitis (often called C. difficile colitis). This disease can damage the lining of the colon or cause the colon to become enlarged (toxic megacolon). CAUSES Your colon normally contains many different bacteria, including C. difficile. The balance of bacteria in your colon can change during illness. This is especially true when you take antibiotic medicine. Taking antibiotics may allow the C. difficile to grow, multiply excessively, and make a toxin that then causes illness. The elderly and people with  certain medical conditions have a greater risk of getting C. difficile infections. SYMPTOMS  Watery diarrhea.  Fever.  Fatigue.  Loss of appetite.  Nausea.  Abdominal swelling, pain, or tenderness.  Dehydration. DIAGNOSIS Your symptoms may make your caregiver suspect a C. difficile infection, especially if you have used antibiotics in the preceding weeks. However, there are only 2 ways to know for certain whether you have a C. difficile infection:  A lab test that finds the toxin in your stool.  The specific appearance of an abnormality (pseudomembrane) in your colon. This can only be seen by doing a sigmoidoscopy or colonoscopy. These procedures involve passing an instrument through your rectum to look at the inside of your colon. Your caregiver will help determine if these tests are necessary. TREATMENT  Most people are successfully treated with one of two specific antibiotics, usually given by mouth. Other antibiotics you are receiving are stopped if possible.  Intravenous (IV) fluids and correction of electrolyte imbalance may be necessary.  Rarely, surgery may be needed to remove the infected part of the intestines.  Careful hand washing by you and your caregivers is important to prevent the spread of infection. In the hospital, your caregivers may also put on gowns and gloves to prevent the spread of the C. difficile bacteria. Your room is also cleaned regularly with a solution containing bleach or a product  that is known to kill C. difficile. HOME CARE INSTRUCTIONS  Drink enough fluids to keep your urine clear or pale yellow. Avoid milk, caffeine, and alcohol.  Ask your caregiver for specific rehydration instructions.  Try eating small, frequent meals rather than large meals.  Take your antibiotics as directed. Finish them even if you start to feel better.  Do not use medicines to slow diarrhea. This could delay healing or cause complications.  Wash your hands  thoroughly after using the bathroom and before preparing food.  Make sure people who live with you wash their hands often, too.  Carefully disinfect all surfaces with a product that contains chlorine bleach. SEEK MEDICAL CARE IF:  Diarrhea persists longer than expected or recurs after completing your course of antibiotic treatment for the C. difficile infection.  You have trouble staying hydrated. SEEK IMMEDIATE MEDICAL CARE IF:  You develop a new fever.  You have increasing abdominal pain or tenderness.  There is blood in your stools, or your stools are dark black and tarry.  You cannot hold down food or liquids. MAKE SURE YOU:  Understand these instructions.  Will watch your condition.  Will get help right away if you are not doing well or get worse. Document Released: 01/17/2005 Document Revised: 08/24/2013 Document Reviewed: 09/15/2010 [facility] Patient Information 2015 [place], [place]. This information is not intended to replace advice given to you by your health care provider. Make sure you discuss any questions you have with your health care provider.

## 2014-06-05 NOTE — Progress Notes (Signed)
Patient discharged to home with family via wheelchair, discharge instructions reviewed with patient who verbalized understanding. New RX's given to patient.

## 2014-06-05 NOTE — Discharge Summary (Signed)
Physician Discharge Summary  KAMILIA CAROLLO VZD:[id number] DOB: [birth year] DOA: 06/02/2014  PCP: [name] Koyanagi, MD  Admit date: 06/02/2014 Discharge date: 06/05/2014  Time spent: Less than 30 minutes  Recommendations for Outpatient Follow-up:  1. Dr. [name], PCP in 3 days with repeat labs (CBC & BMP). Please follow-up ANA results that were sent from the hospital. Please consider outpatient rheumatology consultation for evaluation and management of right wrist pain. 2. Cecille Rubin Hvozdovic, PA-C/GI on 06/14/14 at 9:15 AM.  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  Filed Weights   06/02/14 1357  Weight: 38.5 kg (84 lb 14 oz)    History of present illness:  69 year old female s/p OP colonoscopy 05/27/14 which showed mild diverticulosis in transverse colon, GERD, COPD, OA, OP, anxiety & depression, presented to ED on 06/02/14 with 2-3 day history of pain and swelling of right wrist/hand. She went to urgent care and received Rocephin and Augmentin without relief. Post colonoscopy, she complained of daily diarrhea 4-5 times. No fevers. She was admitted for possible cellulitis.  Hospital Course:    Right wrist tenosynovitis/arthritis: Current clinical picture was not convincing for cellulitis. More concerned about some form of acute arthritis in right wrist joint. Patient denies history of trauma or gout. Serum uric acid normal at 3.8. Obtained MRI right wrist and discussed findings with radiology on 2/11: Low index of suspicion for infectious process and more suggestive of arthritis. Good response to brief IV ketorolac >hesitant to continue using secondary to  history of gastric perforation. Started PO prednisone 30 MG with taper over 10 days to treat for inflammatory arthropathy. CRP: 4, ESR 54 and rheumatoid factor mildly elevated at 16.7-unclear significance. ANA pending. Recommend outpatient rheumatology consultation & follow-up. Significantly improved.  C. difficile diarrhea: Recent colonoscopy without acute findings. C. difficile PCR +. Contact isolation. Started empiric oral Flagyl. No further diarrhea. Continue metronidazole and probiotics. Patient is to complete 2 weeks course of oral Flagyl. Since patient has history off gastric perforation and patient will be discharged on steroids, PPI were continued-can be reassessed by GI as outpatient if can be discontinued.  Hypokalemia: Secondary to diarrhea. Replaced.  COPD: Stable.  History of gastric perforation, repaired with omental patch 07/07/12: No reported abdominal pain, nausea or vomiting. On PPI, H2 blocker and Carafate PTA-continued.  History of anxiety and depression  Malnutrition/failure to thrive: Nutrition consultation. History of small bowel resection for infarcted bowel.  Anemia: Stable.  Consultants:  [facility]  Procedures:  None  Discharge Exam:  Complaints: Right wrist pain is "90% better". Had 2 formed BMs yesterday and none since.  Filed Vitals:   06/04/14 1332 06/04/14 1922 06/04/14 2108 06/05/14 0405  BP: 150/82  132/55 146/66  Pulse: 101 78 77 65  Temp: 99.6 F (37.6 C)  98.4 F (36.9 C) 98.1 F (36.7 C)  TempSrc: Oral  Oral Oral  Resp: '18  16 16  ' Height:      Weight:      SpO2: 100%  98% 100%    General exam: Pleasant middle-aged female patient, small built and frail, sitting up in bed eating breakfast this morning. Respiratory system: Clear. No increased work of breathing. Cardiovascular system: S1 &  S2 heard, RRR. No JVD, murmurs, gallops, clicks or pedal edema. Gastrointestinal system: Abdomen is nondistended, soft and nontender. Normal bowel  sounds heard. Central nervous system: Alert and oriented. No focal neurological deficits. Extremities: Symmetric 5 x 5 power. Right wrist findings significantly improved-minimal swelling and tenderness. No other acute signs.  Discharge Instructions      Discharge Instructions    Call MD for:  extreme fatigue    Complete by:  As directed      Call MD for:  persistant dizziness or light-headedness    Complete by:  As directed      Call MD for:  persistant nausea and vomiting    Complete by:  As directed      Call MD for:  severe uncontrolled pain    Complete by:  As directed      Call MD for:  temperature >100.4    Complete by:  As directed      Call MD for:    Complete by:  As directed   Worsening diarrhea.     Diet general    Complete by:  As directed      Increase activity slowly    Complete by:  As directed             Medication List    STOP taking these medications        alendronate 70 MG tablet  Commonly known as:  FOSAMAX     amoxicillin-clavulanate 875-125 MG per tablet  Commonly known as:  AUGMENTIN     Calcium Carb-Cholecalciferol 1000-800 MG-UNIT Tabs  Commonly known as:  CALCIUM 1000 + D     PROBIOTIC DAILY PO      TAKE these medications        albuterol 108 (90 BASE) MCG/ACT inhaler  Commonly known as:  PROVENTIL HFA;VENTOLIN HFA  Inhale 2 puffs into the lungs every 6 (six) hours as needed for wheezing.     CALCIUM PO  Take 1 tablet by mouth daily.     clidinium-chlordiazePOXIDE 5-2.5 MG per capsule  Commonly known as:  LIBRAX  Take 1 capsule by mouth 4 (four) times daily -  before meals and at bedtime. As needed for abdominal pain     diphenhydrAMINE 12.5 MG/5ML liquid  Commonly known as:  BENADRYL  Take 12.5 mg by mouth 4 (four) times daily as needed for itching.     EPINEPHrine 0.3 mg/0.3 mL Soaj injection  Commonly known as:  EPIPEN  Inject 0.3 mLs (0.3 mg total) into the muscle daily as needed (anaphylaxis).     feeding supplement  (RESOURCE BREEZE) Liqd  Take 1 Container by mouth 3 (three) times daily between meals.     fluocinonide cream 0.05 %  Commonly known as:  LIDEX  Apply 1 application topically 2 (two) times daily as needed (rash).     ketoconazole 2 % shampoo  Commonly known as:  NIZORAL  Apply 1 application topically 2 (two) times a week.     metroNIDAZOLE 500 MG tablet  Commonly known as:  FLAGYL  Take 1 tablet (500 mg total) by mouth every 8 (eight) hours.     multivitamin tablet  Take 1 tablet by mouth daily.     omeprazole 40 MG capsule  Commonly known as:  PRILOSEC  Take 1 capsule (40 mg total) by mouth daily.     ondansetron 4 MG tablet  Commonly known as:  ZOFRAN  TAKE 1 TABLET (4 MG TOTAL) BY MOUTH EVERY  8 (EIGHT) HOURS AS NEEDED FOR NAUSEA OR VOMITING.     oxyCODONE-acetaminophen 5-325 MG per tablet  Commonly known as:  ROXICET  Take 1 tablet by mouth every 8 (eight) hours as needed for severe pain.     polyethylene glycol powder powder  Commonly known as:  GLYCOLAX/MIRALAX  Take 17 g by mouth daily.     potassium chloride SA 20 MEQ tablet  Commonly known as:  K-DUR,KLOR-CON  Take 1 tablet BID for 2 days, Take 1 tablet once a day for 4 days     predniSONE 10 MG tablet  Commonly known as:  DELTASONE  Take 3 tabs daily 3 days, then 2 tabs daily 3 days, then 1 tab daily 3 days, then stop.     ranitidine 150 MG tablet  Commonly known as:  ZANTAC  TAKE 1 TABLET BY MOUTH TWICE A DAY     saccharomyces boulardii 250 MG capsule  Commonly known as:  FLORASTOR  Take 1 capsule (250 mg total) by mouth 2 (two) times daily.     sucralfate 1 G tablet  Commonly known as:  CARAFATE  Take 1 tablet (1 g total) by mouth 4 (four) times daily -  with meals and at bedtime.     Vitamin D (Ergocalciferol) 50000 UNITS Caps capsule  Commonly known as:  DRISDOL  Take 1 capsule (50,000 Units total) by mouth every 7 (seven) days.       Follow-up Information    Follow up with Hvozdovic, Vita Barley, PA-C On 06/14/2014.   Specialty:  Physician Assistant   Why:  9:15--please be at office for 9:00 a.m.   Contact information:   [address] [place] [address] [phone]       Follow up with [name], [name], MD. Schedule an appointment as soon as possible for a visit in 3 days.   Specialties:  Internal Medicine, Adolescent Medicine   Why:  To be seen with repeat labs (CBC & BMP). Hospital follow-up. Consider outpatient rheumatology consultation for right wrist pain evaluation.   Contact information:   [place] [address] [phone]        The results of significant diagnostics from this hospitalization (including imaging, microbiology, ancillary and laboratory) are listed below for reference.    Significant Diagnostic Studies: Dg Chest 2 View  05/22/2014   CLINICAL DATA:  Right-sided pain  EXAM: CHEST  2 VIEW  COMPARISON:  01/28/2014  FINDINGS: There is unchanged mild cardiomegaly. Hilar and mediastinal contours are unremarkable and unchanged. There is chronic left lateral base curvilinear scarring. The lungs are otherwise clear. Pulmonary vasculature is normal. There are no pleural effusions.  IMPRESSION: 1. Unchanged mild cardiomegaly 2. Stable curvilinear scarring in the lateral left base   Electronically Signed   By: Andreas Newport M.D.   On: 05/22/2014 16:24   Dg Wrist Complete Right  06/01/2014   CLINICAL DATA:  Sudden onset of redness and swelling of the right wrist with tenderness.  EXAM: RIGHT WRIST - COMPLETE 3+ VIEW  COMPARISON:  None.  FINDINGS: There small cystic degenerative changes in the lunate and triquetrum. There is no joint space narrowing. There are slight degenerative changes of the first metacarpal phalangeal joint.  There is soft tissue swelling around the wrist.  IMPRESSION: Soft tissue swelling around the wrist, nonspecific. Minimal degenerative changes.   Electronically Signed   By: Lorriane Shire M.D.   On: 06/01/2014 11:00    Mr Hand Right W Wo  Contrast  06/03/2014   CLINICAL DATA:  Swelling of the distal right forearm and right hand through the fingers beginning 06/01/2014 with some redness.  EXAM: MRI OF THE RIGHT HAND WITHOUT AND WITH CONTRAST; MRI OF THE RIGHT FOREARM WITHOUT AND WITH CONTRAST  TECHNIQUE: Multiplanar, multisequence MR imaging was performed both before and after administration of intravenous contrast.  CONTRAST:  7 mL MULTIHANCE GADOBENATE DIMEGLUMINE 529 MG/ML IV SOLN  COMPARISON:  None.  FINDINGS: Subcutaneous edema is seen about the distal forearm and wrist. No focal fluid collection is identified. Synovium about the carpus demonstrates increased prominence and postcontrast enhancement consistent with synovitis. Similar changes are seen in the distal radioulnar joint. There is some soft tissue enhancement about the flexor and extensor tendons but no notable fluid is identified. No erosion is identified. There is no bone marrow signal abnormality to suggest osteomyelitis. Joint spaces and alignment appear maintained. All visualized tendons are intact. No focal bony lesion is identified.  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 Infection is possible but much less likely.  Findings discussed with Dr.  Algis Liming at the time interpretation.   Electronically Signed   By: Inge Rise M.D.   On: 06/03/2014 15:37   Mr Forearm Right Wo/w Cm  06/03/2014   CLINICAL DATA:  Swelling of the distal right forearm and right hand through the fingers beginning 06/01/2014 with some redness.  EXAM: MRI OF THE RIGHT HAND WITHOUT AND WITH CONTRAST; MRI OF THE RIGHT FOREARM WITHOUT AND WITH CONTRAST  TECHNIQUE: Multiplanar, multisequence MR imaging was performed both before and after administration of intravenous contrast.  CONTRAST:  7 mL MULTIHANCE GADOBENATE DIMEGLUMINE 529 MG/ML IV  SOLN  COMPARISON:  None.  FINDINGS: Subcutaneous edema is seen about the distal forearm and wrist. No focal fluid collection is identified. Synovium about the carpus demonstrates increased prominence and postcontrast enhancement consistent with synovitis. Similar changes are seen in the distal radioulnar joint. There is some soft tissue enhancement about the flexor and extensor tendons but no notable fluid is identified. No erosion is identified. There is no bone marrow signal abnormality to suggest osteomyelitis. Joint spaces and alignment appear maintained. All visualized tendons are intact. No focal bony lesion is identified.  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 Infection is possible but much less likely.  Findings discussed with Dr.  Algis Liming at the time interpretation.   Electronically Signed   By: Inge Rise M.D.   On: 06/03/2014 15:37   Dg Hip Unilat With Pelvis 2-3 Views Right  05/22/2014   CLINICAL DATA:  Two day history of right hip pain with pain radiating down right leg. No trauma history.  EXAM: DG HIP W/ PELVIS 2-3V*R*  COMPARISON:  None.  FINDINGS: Frontal pelvis as well as frontal and lateral right hip images were obtained. No fracture or dislocation. There is mild symmetric narrowing of both hip joints. No erosive change. There are areas of atherosclerotic calcification in both common iliac arteries.  IMPRESSION: Mild symmetric narrowing of both hip joints. No fracture or dislocation.   Electronically Signed   By: Lowella Grip M.D.   On: 05/22/2014 16:25    Microbiology: Recent Results (from the past 240 hour(s))  Clostridium Difficile by PCR     Status: Abnormal  Collection Time: 06/02/14  3:34 PM  Result Value Ref Range Status   C difficile by pcr POSITIVE (A) NEGATIVE Final    Comment: CRITICAL RESULT CALLED TO, READ BACK BY AND  VERIFIED WITH: Wilmer Floor RN 11:35 06/03/14 ([email or web address]) Performed at [facility]: Basic Metabolic Panel:  Recent Labs Lab 06/02/14 1128 06/02/14 1900 06/03/14 0440 06/04/14 0421  NA 139 136 140 136  K 2.7* 3.9 3.8 3.7  CL 101 103 105 104  CO2 '28 25 26 25  ' GLUCOSE 90 136* 91 82  BUN '7 7 8 8  ' CREATININE 0.56 0.70 0.55 0.61  CALCIUM 8.8 8.4 8.6 8.8   Liver Function Tests: No results for input(s): AST, ALT, ALKPHOS, BILITOT, PROT, ALBUMIN in the last 168 hours. No results for input(s): LIPASE, AMYLASE in the last 168 hours. No results for input(s): AMMONIA in the last 168 hours. CBC:  Recent Labs Lab 06/02/14 1128 06/03/14 0440 06/04/14 0421 06/05/14 0421  WBC 15.4* 7.8 5.8 6.0  NEUTROABS 13.0*  --   --   --   HGB 12.3 12.2 11.0* 11.4*  HCT 36.8 35.2* 33.4* 34.1*  MCV 86.2 85.4 86.8 85.9  PLT 297 287 292 317   Cardiac Enzymes: No results for input(s): CKTOTAL, CKMB, CKMBINDEX, TROPONINI in the last 168 hours. BNP: BNP (last 3 results) No results for input(s): BNP in the last 8760 hours.  ProBNP (last 3 results) No results for input(s): PROBNP in the last 8760 hours.  CBG: No results for input(s): GLUCAP in the last 168 hours.    Signed:  Vernell Leep, MD, FACP, FHM. [facility] Pager [phone]  If 7PM-7AM, please contact night-coverage [email or web address] Password [facility] 06/05/2014, 10:37 AM

## 2014-06-07 LAB — ANA: ANA: NEGATIVE

## 2014-06-08 ENCOUNTER — Ambulatory Visit (INDEPENDENT_AMBULATORY_CARE_PROVIDER_SITE_OTHER): Payer: Medicare Other | Admitting: Internal Medicine

## 2014-06-08 VITALS — BP 160/80 | HR 104 | Temp 98.4°F | Resp 16 | Ht 65.0 in | Wt 87.0 lb

## 2014-06-08 DIAGNOSIS — R030 Elevated blood-pressure reading, without diagnosis of hypertension: Secondary | ICD-10-CM

## 2014-06-08 DIAGNOSIS — E46 Unspecified protein-calorie malnutrition: Secondary | ICD-10-CM | POA: Diagnosis not present

## 2014-06-08 DIAGNOSIS — IMO0001 Reserved for inherently not codable concepts without codable children: Secondary | ICD-10-CM

## 2014-06-08 DIAGNOSIS — M810 Age-related osteoporosis without current pathological fracture: Secondary | ICD-10-CM | POA: Diagnosis not present

## 2014-06-08 DIAGNOSIS — M659 Synovitis and tenosynovitis, unspecified: Secondary | ICD-10-CM | POA: Diagnosis not present

## 2014-06-08 DIAGNOSIS — R109 Unspecified abdominal pain: Secondary | ICD-10-CM | POA: Diagnosis not present

## 2014-06-08 MED ORDER — OXYCODONE-ACETAMINOPHEN 5-325 MG PO TABS: 1.0000 | ORAL_TABLET | Freq: Three times a day (TID) | ORAL | Status: DC | PRN

## 2014-06-08 NOTE — Progress Notes (Addendum)
Subjective:  This chart was scribed for [name], MD by Donato Schultz, Medical Scribe. This patient was seen in Room 5 and the patient's care was started at 11:56 AM.   Patient ID: Cynthia Bowen, female    DOB: 1945/12/06, 69 y.o.   MRN: [id number]  HPI HPI Comments: Cynthia Bowen is a 69 y.o. female who presents to the [facility] for follow-up after being hospitalized on 06/02/2014 for suspected cellulitis of her right hand  Unresponsive to outpatient therapy. She did not respond to IV antibiotic therapy either and was subsequently thought to have a tenosynovitis/acute arthritis based on the MRI of her upper extremity and a slightly positive rheumatoid factor and mildly elevated sedimentation rate. She had a colonoscopy because of a heme positive stool and the presence of anemia which had been there intermittently for several years particularly following her difficult problems after gastrointestinal surgery.  She was also  diagnosed with C-Diff while in the hafter noted to have loose BMs,however she attributed that symptom to using Miralax.  Since her surgeries she has had a chronic problem with constipation and with irritable bowel symptoms which have responded only to Librax and MiraLAX in combination. Her colonoscopy was importantly not revealing any malignancy.   Upon discharge, she was instructed to follow-up for repeat lab work but her discharge labs were actually good enough to not require further testing at this point.  She is still taking the Prednisone taper she was prescribed.  She is still complaining of pain and swelling to her left hand,  but admits that it is greatly improved and she is now able to use it again without pain. She is no longer taking Fosamax.   it is important to note that her current problem, which was unusual given her past history, started shortly after taking a weekly dose of Fosamax mistakenly taken every day for 7 days.   Patient Active  Problem List   Diagnosis Date Noted   Recurrent abdominal pain--multiple causes, status post GI surgery with multiple complications 5956----LOVFIE over the past several months  03/16/2014   Atherosclerosis of native arteries of the extremities with intermittent claudication 01/15/2014   Pancreatic lesion-6 mm noted on MRI at [facility] 10/08/2013   Femoral artery stenosis-bilateral noted CT at [facility] 2015 10/08/2013   Protein calorie malnutrition 01/15/2013   Depression with anxiety 04/02/2011   Clostridium difficile diarrhea 06/03/2014   Cellulitis of hand--- which actually was discovered to be acute tenosynovitis  06/02/2014   Hypokalemia--- resolved with treatment  06/02/2014   Osteoporosis 05/22/2014   Nephrolithiasis-asymptomatic/noted on scan but with frequent hematuria  10/08/2013   Other and unspecified ovarian cyst--noted on scans 2014 2015/asymptomatic 10/08/2013   PE (pulmonary embolism)--stable since this occurrence and not requiring anticoagulation  08/05/2012   Incarcerated epigastric hernia s/p primary repair 07/02/2012 07/11/2012   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 [id number] 07/10/2012   Pericardial effusion--- resolved since hospital discharge after the event  04/17/2012   COPD GOLD I 01/29/2012   Back pain DDD S/p surgery [place] 1993 12/29/2011   GERD (gastroesophageal reflux disease) 04/02/2011   Osteoarthritis--long-standing, resulted in early disability at work with subsequent termination  04/02/2011   Cervical neck pain with evidence of disc disease-s/p surgery [id number] 04/02/2011   Fibromyalgia-[place] 2004--- no therapy in several years  04/02/2011    Past Surgical History  Procedure Laterality Date   Abdominal hysterectomy     Esophagogastroduodenoscopy  04/18/2012  Procedure: ESOPHAGOGASTRODUODENOSCOPY (EGD);  Surgeon: Inda Castle, MD;  Location: [facility];  Service:  Endoscopy;  Laterality: N/A;   [place]  05/29/2012    Procedure: UPPER ENDOSCOPIC ULTRASOUND (EUS) LINEAR;  Surgeon: Milus Banister, MD;  Location: [facility];  Service: Endoscopy;  Laterality: N/A;   Appendectomy     Spine surgery      CERVICAL SPINE SURGERY X 2 - INCLUDING FUSION; LUMBAR SURGERY FOR RUPTURED [place]   Eye surgery      BILATERAL CATARACT EXTRACTIONS   Laparoscopic abdominal exploration N/A 07/02/2012    Procedure: converted to laparotomy with gastric biopsy;  Surgeon: Imogene Burn. Georgette Dover, MD;  Location: [facility];  Service: General;  Laterality: N/A;  Laparoscopic Gastric Biospy attempted.    Laparotomy N/A 07/07/2012    Procedure: EXPLORATORY LAPAROTOMY repair of gastric perforation with omental graham patch, drainage of abdominal abcess;  Surgeon: Imogene Burn. Georgette Dover, MD;  Location: [facility];  Service: General;  Laterality: N/A;   Stomach surgery  07/07/2012    Omental patch of gastric perforation   Laparotomy N/A 07/13/2012    Procedure: EXPLORATORY LAPAROTOMY repair gastric leak;  Surgeon: Edward Jolly, MD;  Location: [facility];  Service: General;  Laterality: N/A;   Laparotomy N/A 11/11/2012    Procedure: EXPLORATORY LAPAROTOMY;  Surgeon: Imogene Burn. Georgette Dover, MD;  Location: [place];  Service: General;  Laterality: N/A;   Bowel resection N/A 11/11/2012    Procedure: SMALL BOWEL RESECTION;  Surgeon: Imogene Burn. Georgette Dover, MD;  Location: [place];  Service: General;  Laterality: N/A;   Minor application of wound vac N/A 11/11/2012    Procedure: APPLICATION OF WOUND VAC;  Surgeon: Imogene Burn. Georgette Dover, MD;  Location: [place];  Service: General;  Laterality: N/A;   Jejunostomy N/A 11/11/2012    Procedure: PLACEMENT OF FEEDING JEJUNOSTOMY TUBE;  Surgeon: Imogene Burn. Georgette Dover, MD;  Location: [place];  Service: General;  Laterality: N/A;   Lysis of adhesion N/A 11/11/2012    Procedure: LYSIS OF ADHESION;  Surgeon: Imogene Burn. Tsuei, MD;  Location: [facility];  Service: General;  Laterality: N/A;   Hernia repair      Gastric fistula repair      05/16/2013   Colonoscopy     Polypectomy     Upper gastrointestinal endoscopy     Family History  Problem Relation Age of Onset   COPD Mother    Hyperlipidemia Mother    Hypertension Maternal Grandmother    Stroke Maternal Grandmother    Colon cancer Neg Hx     Allergies  Allergen Reactions   Avelox [Moxifloxacin Hcl In Nacl] Nausea And Vomiting   Betadine [Povidone Iodine] Itching and Rash   Alendronate Sodium Nausea And Vomiting   Alendronate Sodium     Other reaction(s): Dizziness (intolerance)   Aspirin Nausea Only   Codeine Nausea And Vomiting   Doxycycline     Pt doesn't remember reaction   Fluconazole     Pt doesn't remember reaction   Hydrocodone Nausea And Vomiting    GI distress   Hydrocodone-Acetaminophen Nausea And Vomiting   Latex    Neurontin [Gabapentin] Other (See Comments)    Mood changes    Nsaids Other (See Comments)    Severe gastritis & perforation - avoid NSAIDs when possible   Quinolones Itching and Hives   Sertraline Hcl Other (See Comments)    Hallucinations    Sertraline Hcl Nausea And Vomiting   Sulfamethoxazole Itching and Hives   Sulfa Antibiotics Rash  Review of Systems  Musculoskeletal: Positive for joint swelling and arthralgias.   no dyspnea on exertion or shortness of breath   no chest pain or palpitation No abdominal pain today No diarrhea or loose stools  Objective:  Physical Exam  Constitutional: She is oriented to person, place, and time. She appears well-developed and well-nourished.  HENT:  Head: Normocephalic and atraumatic.  Eyes: EOM are normal.  Neck: Normal range of motion.  Cardiovascular: Normal rate.   Pulmonary/Chest: Effort normal.  Musculoskeletal: Normal range of motion.  Right arm or hand does not exhibit any redness or swelling and she has good range of motion.  Neurological: She is alert and oriented to person, place, and time.  Skin: Skin  is warm and dry.  Psychiatric: She has a normal mood and affect. Her behavior is normal.  Nursing note and vitals reviewed.   BP 160/80 mmHg   Pulse 104   Temp(Src) 98.4 F (36.9 C) (Oral)   Resp 16   Ht 5\' 5"  (1.651 m)   Wt 87 lb (39.463 kg)   BMI 14.48 kg/m2   SpO2 100% Assessment & Plan:   problem #1 acute onset of right upper extremity wrist and hand tenosynovitis/arthritis one week following an overdose of Fosamax with a positive rheumatoid factor at a low level, negative RNA, and a mildly elevated sedimentation rate. She's had those studies in the past and everything was negative. She carries a long-standing diagnosis of osteoarthritis of the extremities and the spine. She needs further evaluation by rheumatology to try and explain her recent episode, and plan for the future.   Problem #2 Clostridium difficile positivity--she has gastrointestinal follow-up. She is currently asymptomatic. It is of interest that she was minimally symptomatic at the time of her diagnosis although she had certainly had recent antibiotic therapy to excess.. Further comment about the importance of this test should be forthcoming with this consult.  Problem #3 protein calorie malnutrition--she has discovered a new supplement by way of a nutrition consult in the hospital which she is tolerating and enjoying very well and is trying to order over the Internet=Breeze.  Problem #4 recurrent abdominal pain-stable for the time being  Problem #5 elevated blood pressure with a past history of hypertension but without needing medication for several years following her gastrointestinal problems with her significant weight loss. We will observe this for now. She does have significant underlying atherosclerotic disease.  I have completed the patient encounter in its entirety as documented by the scribe, with editing by me where necessary. Robert P. Laney Pastor, M.D.

## 2014-06-14 ENCOUNTER — Ambulatory Visit: Payer: Federal, State, Local not specified - PPO | Admitting: Physician Assistant

## 2014-06-15 ENCOUNTER — Telehealth: Payer: Self-pay | Admitting: Internal Medicine

## 2014-06-15 NOTE — Telephone Encounter (Signed)
Patient got out of hospital recently and states that she wants to stop taking the medications that she was given. She states that it is too painful for her to continue these meds. One medication she is taking is Flagyl and the other one she does not know the name of.   [phone]

## 2014-06-16 ENCOUNTER — Other Ambulatory Visit: Payer: Self-pay

## 2014-06-16 MED ORDER — FLUCONAZOLE 150 MG PO TABS: 150.0000 mg | ORAL_TABLET | Freq: Once | ORAL | Status: DC

## 2014-06-16 NOTE — Telephone Encounter (Signed)
Pt wants to stop the Flagyl and Florastor. Pt is feeling very nauseous and continues to have a lot of abdominal pain.  Pt also says the Flagyl gave her a year infection and would like some Diflucan.  Please advise.

## 2014-06-16 NOTE — Telephone Encounter (Signed)
Ok to stop flagyl but continue the probiotic-it won't hurt Ok to call in diflu [id number]

## 2014-06-16 NOTE — Telephone Encounter (Signed)
Spoke to pt and gave her instructions. Also sent in Diflucan for pt. Pt is aware.

## 2014-06-25 ENCOUNTER — Ambulatory Visit (INDEPENDENT_AMBULATORY_CARE_PROVIDER_SITE_OTHER): Payer: Medicare Other | Admitting: Internal Medicine

## 2014-06-25 ENCOUNTER — Ambulatory Visit (INDEPENDENT_AMBULATORY_CARE_PROVIDER_SITE_OTHER): Payer: Medicare Other

## 2014-06-25 VITALS — BP 122/74 | HR 110 | Temp 98.4°F | Resp 17 | Ht 65.0 in | Wt 86.0 lb

## 2014-06-25 DIAGNOSIS — M65949 Unspecified synovitis and tenosynovitis, unspecified hand: Secondary | ICD-10-CM

## 2014-06-25 DIAGNOSIS — R109 Unspecified abdominal pain: Secondary | ICD-10-CM | POA: Diagnosis not present

## 2014-06-25 DIAGNOSIS — R1013 Epigastric pain: Secondary | ICD-10-CM

## 2014-06-25 DIAGNOSIS — I70219 Atherosclerosis of native arteries of extremities with intermittent claudication, unspecified extremity: Secondary | ICD-10-CM

## 2014-06-25 DIAGNOSIS — M659 Synovitis and tenosynovitis, unspecified: Secondary | ICD-10-CM

## 2014-06-25 MED ORDER — SUCRALFATE 1 G PO TABS: 1.0000 g | ORAL_TABLET | Freq: Three times a day (TID) | ORAL | Status: DC

## 2014-06-25 MED ORDER — PREDNISONE 20 MG PO TABS: ORAL_TABLET | ORAL | Status: DC

## 2014-06-25 NOTE — Progress Notes (Signed)
Subjective:    Patient ID: Cynthia Bowen, female    DOB: 03/01/1946, 69 y.o.   MRN: [id number] This chart was scribed for [name], MD by Steva Colder, ED Scribe. The patient was seen in room 9 at 1:55 PM.   Chief Complaint  Patient presents with  . Hand Pain    HPI  HPI Comments: Cynthia Bowen is a 69 y.o. female who presents to the Emergency Department complaining of right hand pain onset 06/02/14. Pt has pain with twisting her right wrist, and she has to use her left hand to start her car. She states that she is having associated symptoms of abdominal cramping, joint swelling, redness on right hand, issues with eating and drinking, abdominal bloating. After eating and drinking the cramping will begin and does not end until she eats again. Pt has been trying to eat soft foods and soups. Pt will wake up in the middle of the night with a sharp pressure feeling to her upper abdomen. She denies numbness, diarrhea, constipation, trouble breathing, frequency, urgency, fever, and any other symptoms. Pt still has her gallbladder.    Patient Active Problem List   Diagnosis Date Noted  . Protein-calorie malnutrition, severe 06/04/2014  . Clostridium difficile diarrhea 06/03/2014  . Cellulitis of hand 06/02/2014  . Hypokalemia 06/02/2014  . Osteoporosis 05/22/2014  . Recurrent abdominal pain--multiple causes, status post GI surgery with multiple complications 9741 [id number]/84/5364  . Abnormal CT scan 02/08/2014  . Atherosclerosis of native arteries of the extremities with intermittent claudication 01/15/2014  . Nephrolithiasis-asymptomatic/noted on scan 10/08/2013  . Other and unspecified ovarian cyst--noted on scans 2014 2015/asymptomatic 10/08/2013  . Pancreatic lesion-6 mm noted on MRI at [facility] 10/08/2013  . Femoral artery stenosis-bilateral noted CT at [facility] 2015 10/08/2013  . Protein calorie malnutrition 01/15/2013  . PE (pulmonary embolism) 08/05/2012  .  Incarcerated epigastric hernia s/p primary repair 07/02/2012 07/11/2012  . 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 [id number] 07/10/2012  . Pericardial effusion 04/17/2012  . COPD GOLD I 01/29/2012  . Back pain DDD S/p surgery [place] 1993 12/29/2011  . Depression with anxiety 04/02/2011  . GERD (gastroesophageal reflux disease) 04/02/2011  . Osteoarthritis 04/02/2011  . Cervical neck pain with evidence of disc disease-s/p surgery [id number] 04/02/2011  . Fibromyalgia-[place] 2004 04/02/2011   Past Medical History  Diagnosis Date  . COPD (chronic obstructive pulmonary disease)   . Pneumonia 12-2011  . GERD (gastroesophageal reflux disease)   . Headache(784.0)   . Arthritis     osteoarthritis  . Allergy   . Depression   . Neuromuscular disorder   . Osteoporosis   . Bronchitis     CURRENTLY AS OF 06/30/12 - HAS COUGH AND FINISHED ANTIBIOTIC FOR BRONCHITIS  . Fibromyalgia   . Pain     ABDOMINAL PAIN AND NAUSEA  . Pain     SOMETIMES PAIN RIGHT EAR AND NECK--STATES CAUSED BY A "LUMP" ON BACK OF EAR--USES KENALOG CREAM TOPICALLY AS NEEDED.  Marland Kitchen Gastrocutaneous fistula   . Anemia   . Anxiety   . Blood transfusion without reported diagnosis    Past Surgical History  Procedure Laterality Date  . Abdominal hysterectomy    . Esophagogastroduodenoscopy  04/18/2012    Procedure: ESOPHAGOGASTRODUODENOSCOPY (EGD);  Surgeon: Inda Castle, MD;  Location: [facility];  Service: Endoscopy;  Laterality: N/A;  . Eus  05/29/2012    Procedure: UPPER ENDOSCOPIC ULTRASOUND (EUS) LINEAR;  Surgeon: Milus Banister, MD;  Location:  [facility];  Service: Endoscopy;  Laterality: N/A;  . Appendectomy    . Spine surgery      CERVICAL SPINE SURGERY X 2 - INCLUDING FUSION; LUMBAR SURGERY FOR RUPTURED DISC  . Eye surgery      BILATERAL CATARACT EXTRACTIONS  . Laparoscopic abdominal exploration N/A 07/02/2012    Procedure: converted to laparotomy with gastric biopsy;   Surgeon: Imogene Burn. Georgette Dover, MD;  Location: [facility];  Service: General;  Laterality: N/A;  Laparoscopic Gastric Biospy attempted.   . Laparotomy N/A 07/07/2012    Procedure: EXPLORATORY LAPAROTOMY repair of gastric perforation with omental graham patch, drainage of abdominal abcess;  Surgeon: Imogene Burn. Georgette Dover, MD;  Location: [facility];  Service: General;  Laterality: N/A;  . Stomach surgery  07/07/2012    Omental patch of gastric perforation  . Laparotomy N/A 07/13/2012    Procedure: EXPLORATORY LAPAROTOMY repair gastric leak;  Surgeon: Edward Jolly, MD;  Location: [facility];  Service: General;  Laterality: N/A;  . Laparotomy N/A 11/11/2012    Procedure: EXPLORATORY LAPAROTOMY;  Surgeon: Imogene Burn. Georgette Dover, MD;  Location: [place];  Service: General;  Laterality: N/A;  . Bowel resection N/A 11/11/2012    Procedure: SMALL BOWEL RESECTION;  Surgeon: Imogene Burn. Georgette Dover, MD;  Location: [place];  Service: General;  Laterality: N/A;  . Minor application of wound vac N/A 11/11/2012    Procedure: APPLICATION OF WOUND VAC;  Surgeon: Imogene Burn. Georgette Dover, MD;  Location: [place];  Service: General;  Laterality: N/A;  . Jejunostomy N/A 11/11/2012    Procedure: PLACEMENT OF FEEDING JEJUNOSTOMY TUBE;  Surgeon: Imogene Burn. Georgette Dover, MD;  Location: [place];  Service: General;  Laterality: N/A;  . Lysis of adhesion N/A 11/11/2012    Procedure: LYSIS OF ADHESION;  Surgeon: Imogene Burn. Georgette Dover, MD;  Location: [place];  Service: General;  Laterality: N/A;  . Hernia repair    . Gastric fistula repair      05/16/2013  . Colonoscopy    . Polypectomy    . Upper gastrointestinal endoscopy     Allergies  Allergen Reactions  . Avelox [Moxifloxacin Hcl In Nacl] Nausea And Vomiting  . Betadine [Povidone Iodine] Itching and Rash  . Alendronate Sodium Nausea And Vomiting  . Alendronate Sodium     Other reaction(s): Dizziness (intolerance)  . Aspirin Nausea Only  . Codeine Nausea And Vomiting  . Doxycycline     Pt doesn't remember reaction  .  Fluconazole     Pt doesn't remember reaction  . Hydrocodone Nausea And Vomiting    GI distress  . Hydrocodone-Acetaminophen Nausea And Vomiting  . Latex   . Neurontin [Gabapentin] Other (See Comments)    Mood changes   . Nsaids Other (See Comments)    Severe gastritis & perforation - avoid NSAIDs when possible  . Quinolones Itching and Hives  . Sertraline Hcl Other (See Comments)    Hallucinations   . Sertraline Hcl Nausea And Vomiting  . Sulfamethoxazole Itching and Hives  . Sulfa Antibiotics Rash   Prior to Admission medications   Medication Sig Start Date End Date Taking? Authorizing Provider  polyethylene glycol powder (GLYCOLAX/MIRALAX) powder Take 17 g by mouth daily. 01/28/14  Yes Wardell Honour, MD  Probiotic Product (PROBIOTIC DAILY PO) Take by mouth.   Yes Historical Provider, MD  ranitidine (ZANTAC) 150 MG tablet TAKE 1 TABLET BY MOUTH TWICE A DAY 04/14/14  Yes [name] Koyanagi, MD  albuterol (PROVENTIL HFA;VENTOLIN HFA) 108 (90 BASE) MCG/ACT inhaler  Inhale 2 puffs into the lungs every 6 (six) hours as needed for wheezing. Patient not taking: Reported on 06/25/2014 11/17/13   [name] Koyanagi, MD  CALCIUM PO Take 1 tablet by mouth daily.    Historical Provider, MD  clidinium-chlordiazePOXIDE (LIBRAX) 5-2.5 MG per capsule Take 1 capsule by mouth 4 (four) times daily -  before meals and at bedtime. As needed for abdominal pain Patient not taking: Reported on 06/25/2014 03/15/14   [name] Koyanagi, MD  diphenhydrAMINE (BENADRYL) 12.5 MG/5ML liquid Take 12.5 mg by mouth 4 (four) times daily as needed for itching.     Historical Provider, MD  EPINEPHrine (EPIPEN) 0.3 mg/0.3 mL IJ SOAJ injection Inject 0.3 mLs (0.3 mg total) into the muscle daily as needed (anaphylaxis). Patient not taking: Reported on 06/25/2014 10/07/13   [name] Koyanagi, MD  feeding supplement, RESOURCE BREEZE, (RESOURCE BREEZE) LIQD Take 1 Container by mouth 3 (three) times daily between meals. Patient not  taking: Reported on 06/25/2014 06/05/14   Modena Jansky, MD  fluconazole (DIFLUCAN) 150 MG tablet Take 1 tablet (150 mg total) by mouth once. Patient not taking: Reported on 06/25/2014 06/16/14   [name] Koyanagi, MD  fluocinonide cream (LIDEX) 1.61 % Apply 1 application topically 2 (two) times daily as needed (rash). Patient not taking: Reported on 06/25/2014 06/05/14   Modena Jansky, MD  ketoconazole (NIZORAL) 2 % shampoo Apply 1 application topically 2 (two) times a week. Patient not taking: Reported on 06/25/2014 10/28/13   [name] Koyanagi, MD  metroNIDAZOLE (FLAGYL) 500 MG tablet Take 1 tablet (500 mg total) by mouth every 8 (eight) hours. Patient not taking: Reported on 06/25/2014 06/05/14   Modena Jansky, MD  Multiple Vitamin (MULTIVITAMIN) tablet Take 1 tablet by mouth daily.    Historical Provider, MD  omeprazole (PRILOSEC) 40 MG capsule Take 1 capsule (40 mg total) by mouth daily. Patient not taking: Reported on 06/25/2014 05/26/14   Posey Boyer, MD  ondansetron (ZOFRAN) 4 MG tablet TAKE 1 TABLET (4 MG TOTAL) BY MOUTH EVERY 8 (EIGHT) HOURS AS NEEDED FOR NAUSEA OR VOMITING. Patient not taking: Reported on 06/25/2014 05/10/14   [name] Koyanagi, MD  oxyCODONE-acetaminophen (ROXICET) 5-325 MG per tablet Take 1 tablet by mouth every 8 (eight) hours as needed for severe pain. Patient not taking: Reported on 06/25/2014 06/08/14   [name] Koyanagi, MD  potassium chloride SA (K-DUR,KLOR-CON) 20 MEQ tablet Take 1 tablet BID for 2 days, Take 1 tablet once a day for 4 days Patient not taking: Reported on 06/25/2014 05/26/14   Chelle S Jeffery, PA-C  predniSONE (DELTASONE) 10 MG tablet Take 3 tabs daily 3 days, then 2 tabs daily 3 days, then 1 tab daily 3 days, then stop. Patient not taking: Reported on 06/25/2014 06/05/14   Modena Jansky, MD  saccharomyces boulardii (FLORASTOR) 250 MG capsule Take 1 capsule (250 mg total) by mouth 2 (two) times daily. Patient not taking: Reported on 06/25/2014 06/05/14    Modena Jansky, MD  sucralfate (CARAFATE) 1 G tablet Take 1 tablet (1 g total) by mouth 4 (four) times daily -  with meals and at bedtime. Patient not taking: Reported on 06/25/2014 05/26/14   Posey Boyer, MD  Vitamin D, Ergocalciferol, (DRISDOL) 50000 UNITS CAPS capsule Take 1 capsule (50,000 Units total) by mouth every 7 (seven) days. Patient not taking: Reported on 06/25/2014 05/22/14   Robyn Haber, MD   IMPRESSION:02/12/14 Normal hepatobiliary patency scan. Normal gallbladder ejection  fraction.  Uric acid 3.8 06/03/14 RF low pos w/ elevated esr 50s same time  Review of Systems  Constitutional: Negative for fever.  Respiratory: Negative for shortness of breath.   Gastrointestinal: Positive for abdominal pain and abdominal distention. Negative for diarrhea and constipation.  Genitourinary: Negative for urgency and frequency.  Musculoskeletal: Positive for joint swelling and arthralgias.  Skin:       Redness to the right wrist area  Neurological: Negative for numbness.       Objective:   Physical Exam  Constitutional: She is oriented to person, place, and time. She appears well-developed and well-nourished. No distress.  HENT:  Head: Normocephalic and atraumatic.  Eyes: EOM are normal.  Neck: Neck supple. No tracheal deviation present.  Cardiovascular: Normal rate, regular rhythm and normal heart sounds.   No murmur heard. Pulmonary/Chest: Effort normal and breath sounds normal. No respiratory distress. She has no wheezes. She has no rales.  Abdominal: She exhibits distension. She exhibits no abdominal bruit and no mass. Bowel sounds are increased. There is tenderness in the epigastric area and periumbilical area. There is guarding. There is no rigidity.  Musculoskeletal: Normal range of motion.       Right elbow: Normal.      Right wrist: She exhibits tenderness and swelling.       Right forearm: Normal.  Swelling on the radial aspect with erythema. Tenderness including  the distal radius, the CMC joint #1, and around the radial aspect in general. Negative pain in the extensor tendon, but has marked discomfort with radial and ulnar deviation and with forced flexion and extension.  The right forearm and elbow are nl.   Neurological: She is alert and oriented to person, place, and time.  Skin: Skin is warm and dry.  Psychiatric: She has a normal mood and affect. Her behavior is normal.  Nursing note and vitals reviewed.        Wt Readings from Last 3 Encounters:  06/25/14 86 lb (39.009 kg)  06/08/14 87 lb (39.463 kg)  06/02/14 84 lb 14 oz (38.5 kg)     BP 122/74 mmHg  Pulse 110  Temp(Src) 98.4 F (36.9 C) (Oral)  Resp 17  Ht '5\' 5"'  (1.651 m)  Wt 86 lb (39.009 kg)  BMI 14.31 kg/m2  SpO2 96%  [facility] reading (PRIMARY) by  Dr. Laney Pastor there are no acute findings in the chest but residual scarring in the left lower lobe at the diet of the diaphragm without obvious effusion. The abdomen appears distended with air but no obvious air-fluid levels to suggest obstruction   Assessment & Plan:  DIAGNOSTIC STUDIES: Oxygen Saturation is 96% on room air, adequate by my interpretation.    COORDINATION OF CARE: 2:09 PM-Discussed treatment plan which includes X-ray of acute abdomen with pt at bedside and pt agreed to plan.   Abdominal pain, epigastric -gaseous distention without obvious obstruction Now the bowel movements are regular and soft she can continue MiraLAX as needed Continue  Librax Add Mylicon Retreat with Carafate Soft diet  Recurrence of acute tenosynovitis of right wrist- Prednisone/follow-up with rheumatology to discover etiology Referred to [facility] for postop follow-up with recurrent abdominal pain  I have completed the patient encounter in its entirety as documented by the scribe, with editing by me where necessary. Imaan Padgett P. Laney Pastor, M.D.

## 2014-06-28 ENCOUNTER — Telehealth: Payer: Self-pay | Admitting: Family Medicine

## 2014-06-28 NOTE — Telephone Encounter (Signed)
Opened in error

## 2014-06-29 ENCOUNTER — Other Ambulatory Visit: Payer: Self-pay

## 2014-06-29 DIAGNOSIS — M810 Age-related osteoporosis without current pathological fracture: Secondary | ICD-10-CM

## 2014-06-29 MED ORDER — VITAMIN D (ERGOCALCIFEROL) 1.25 MG (50000 UNIT) PO CAPS: 50000.0000 [IU] | ORAL_CAPSULE | ORAL | Status: DC

## 2014-06-29 MED ORDER — ALENDRONATE SODIUM 70 MG PO TABS: 70.0000 mg | ORAL_TABLET | ORAL | Status: AC

## 2014-07-01 ENCOUNTER — Other Ambulatory Visit: Payer: Self-pay

## 2014-07-01 MED ORDER — RANITIDINE HCL 150 MG PO TABS: 150.0000 mg | ORAL_TABLET | Freq: Two times a day (BID) | ORAL | Status: DC

## 2014-07-01 MED ORDER — POLYETHYLENE GLYCOL 3350 17 GM/SCOOP PO POWD: 17.0000 g | Freq: Every day | ORAL | Status: DC

## 2014-07-01 MED ORDER — ONDANSETRON HCL 4 MG PO TABS: ORAL_TABLET | ORAL | Status: DC

## 2014-07-08 ENCOUNTER — Telehealth: Payer: Self-pay

## 2014-07-08 NOTE — Telephone Encounter (Addendum)
Patient of Dr. Laney Pastor. Says she is having an increase in abdominal pain. She usually has pain all over, but sometimes it gets really severe. The severe pain comes and goes. She also complains of increasing nausea.Patient states "I can't eat or do anything" cb# [phone].

## 2014-07-12 NOTE — Telephone Encounter (Signed)
Pt thinks the fosamax is making her hurt from head to toe. What would you like for her to do?

## 2014-07-12 NOTE — Telephone Encounter (Signed)
lmom to cb. 

## 2014-07-12 NOTE — Telephone Encounter (Signed)
Patient states she is aching all over from her little toe to her hair line.  She says it is a side effect from the medication Dr. Carlean Jews prescribed her.    [phone]

## 2014-07-13 NOTE — Telephone Encounter (Signed)
Stop the Fosamax.  If pain does not stop by the time next dose is due, please return for evaluation

## 2014-07-13 NOTE — Telephone Encounter (Signed)
Gave pt message.

## 2014-07-16 ENCOUNTER — Ambulatory Visit (INDEPENDENT_AMBULATORY_CARE_PROVIDER_SITE_OTHER): Payer: Medicare Other | Admitting: Family Medicine

## 2014-07-16 ENCOUNTER — Ambulatory Visit (INDEPENDENT_AMBULATORY_CARE_PROVIDER_SITE_OTHER): Payer: Medicare Other

## 2014-07-16 VITALS — BP 124/80 | HR 66 | Temp 97.7°F | Resp 16 | Ht 65.0 in | Wt 82.6 lb

## 2014-07-16 DIAGNOSIS — M2550 Pain in unspecified joint: Secondary | ICD-10-CM

## 2014-07-16 DIAGNOSIS — Z9889 Other specified postprocedural states: Secondary | ICD-10-CM | POA: Diagnosis not present

## 2014-07-16 DIAGNOSIS — E440 Moderate protein-calorie malnutrition: Secondary | ICD-10-CM | POA: Diagnosis not present

## 2014-07-16 DIAGNOSIS — J441 Chronic obstructive pulmonary disease with (acute) exacerbation: Secondary | ICD-10-CM

## 2014-07-16 DIAGNOSIS — Z903 Acquired absence of stomach [part of]: Secondary | ICD-10-CM

## 2014-07-16 DIAGNOSIS — R634 Abnormal weight loss: Secondary | ICD-10-CM

## 2014-07-16 DIAGNOSIS — I70219 Atherosclerosis of native arteries of extremities with intermittent claudication, unspecified extremity: Secondary | ICD-10-CM

## 2014-07-16 LAB — POCT UA - MICROSCOPIC ONLY
Bacteria, U Microscopic: NEGATIVE
Casts, Ur, LPF, POC: NEGATIVE
Crystals, Ur, HPF, POC: NEGATIVE
Mucus, UA: NEGATIVE
RBC, urine, microscopic: NEGATIVE
Yeast, UA: NEGATIVE

## 2014-07-16 LAB — POCT CBC
Granulocyte percent: 64.3 %G (ref 37–80)
HCT, POC: 39.9 % (ref 37.7–47.9)
Hemoglobin: 12.6 g/dL (ref 12.2–16.2)
Lymph, poc: 2.3 (ref 0.6–3.4)
MCH, POC: 28.2 pg (ref 27–31.2)
MCHC: 31.6 g/dL — AB (ref 31.8–35.4)
MCV: 89.2 fL (ref 80–97)
MID (cbc): 0.4 (ref 0–0.9)
MPV: 6.7 fL (ref 0–99.8)
POC Granulocyte: 4.8 (ref 2–6.9)
POC LYMPH PERCENT: 31 %L (ref 10–50)
POC MID %: 4.7 %M (ref 0–12)
Platelet Count, POC: 342 10*3/uL (ref 142–424)
RBC: 4.47 M/uL (ref 4.04–5.48)
RDW, POC: 17.1 %
WBC: 7.5 10*3/uL (ref 4.6–10.2)

## 2014-07-16 LAB — POCT SEDIMENTATION RATE: POCT SED RATE: 20 mm/hr (ref 0–22)

## 2014-07-16 LAB — POCT URINALYSIS DIPSTICK
Bilirubin, UA: NEGATIVE
Blood, UA: NEGATIVE
Glucose, UA: NEGATIVE
Ketones, UA: NEGATIVE
Nitrite, UA: NEGATIVE
Spec Grav, UA: 1.015
Urobilinogen, UA: 0.2
pH, UA: 5

## 2014-07-16 LAB — COMPLETE METABOLIC PANEL WITH GFR
ALT: 13 U/L (ref 0–35)
AST: 18 U/L (ref 0–37)
Albumin: 3.7 g/dL (ref 3.5–5.2)
Alkaline Phosphatase: 83 U/L (ref 39–117)
BUN: 12 mg/dL (ref 6–23)
CO2: 24 mEq/L (ref 19–32)
Calcium: 8.6 mg/dL (ref 8.4–10.5)
Chloride: 110 mEq/L (ref 96–112)
Creat: 0.61 mg/dL (ref 0.50–1.10)
GFR, Est African American: >89 mL/min
GFR, Est Non African American: >89 mL/min
Glucose, Bld: 103 mg/dL — ABNORMAL HIGH (ref 70–99)
Potassium: 4.2 mEq/L (ref 3.5–5.3)
Sodium: 144 mEq/L (ref 135–145)
Total Bilirubin: 0.3 mg/dL (ref 0.2–1.2)
Total Protein: 6.7 g/dL (ref 6.0–8.3)

## 2014-07-16 LAB — VITAMIN B12: Vitamin B-12: 514 pg/mL (ref 211–911)

## 2014-07-16 MED ORDER — METHYLPREDNISOLONE ACETATE 80 MG/ML IJ SUSP
120.0000 mg | Freq: Once | INTRAMUSCULAR | Status: AC
  Administered 2014-07-16: 120 mg via INTRAMUSCULAR

## 2014-07-16 NOTE — Progress Notes (Signed)
Subjective:    Patient ID: Cynthia Bowen, female    DOB: 1946/02/28, 68 y.o.   MRN: [id number] This chart was scribed for Robyn Haber, MD by Marti Sleigh, Medical Scribe. This patient was seen in Room 13 and the patient's care was started a 9:09 AM.  Chief Complaint  Patient presents with   Follow-up    Medication is causing side effects, FOSAMAX    HPI HPI Comments: Cynthia Bowen is a 69 y.o. female with a past hx of PE, COPD, pericardial effusion, GERD, osteopetrosis, recent partial gastrectomy, and and numerous other conditions who presents to [facility] complaining of stiffness in her muscles for the last 1.5 weeks. Pt states her sx started suddenly 1.5 weeks ago. Pt states she has associated weakness. Pt states when she has difficulty getting out of the bed in the morning. Pt states she has been too weak to go on her normal walks.  Pt states she has been consistently losing weight for some time. Pt denies diarrhea or blood in stool.   Pt states she has a persistent mild productive cough, and states that if she bends over when she coughs she produces some clear phlegm.   Pt states she does not smoke currently.    Review of Systems  Constitutional: Negative for fever and chills.  Respiratory: Positive for cough and chest tightness.   Gastrointestinal: Negative for diarrhea and blood in stool.  Musculoskeletal: Positive for myalgias and arthralgias.  Neurological: Positive for weakness.       Objective:   Physical Exam  Constitutional: She is oriented to person, place, and time. She appears well-developed and well-nourished. No distress.  HENT:  Head: Normocephalic and atraumatic.  Eyes: Pupils are equal, round, and reactive to light.  Neck: Neck supple.  Cardiovascular: Normal rate.   Pulmonary/Chest: Effort normal. No respiratory distress.  Rales on left. Decreased breath sounds on right.  Musculoskeletal: Normal range of motion.  Neurological: She is alert and  oriented to person, place, and time. Coordination normal.  Skin: Skin is warm and dry. She is not diaphoretic.  Ventral surgical hernia.  Psychiatric: She has a normal mood and affect. Her behavior is normal.  Nursing note and vitals reviewed.  Results for orders placed or performed in visit on 07/16/14  POCT CBC  Result Value Ref Range   WBC 7.5 4.6 - 10.2 K/uL   Lymph, poc 2.3 0.6 - 3.4   POC LYMPH PERCENT 31.0 10 - 50 %L   MID (cbc) 0.4 0 - 0.9   POC MID % 4.7 0 - 12 %M   POC Granulocyte 4.8 2 - 6.9   Granulocyte percent 64.3 37 - 80 %G   RBC 4.47 4.04 - 5.48 M/uL   Hemoglobin 12.6 12.2 - 16.2 g/dL   HCT, POC 39.9 37.7 - 47.9 %   MCV 89.2 80 - 97 fL   MCH, POC 28.2 27 - 31.2 pg   MCHC 31.6 (A) 31.8 - 35.4 g/dL   RDW, POC 17.1 %   Platelet Count, POC 342 142 - 424 K/uL   MPV 6.7 0 - 99.8 fL  POCT urinalysis dipstick  Result Value Ref Range   Color, UA yellow    Clarity, UA clear    Glucose, UA neg    Bilirubin, UA neg    Ketones, UA neg    Spec Grav, UA 1.015    Blood, UA neg    pH, UA 5.0    Protein, UA trace  Urobilinogen, UA 0.2    Nitrite, UA neg    Leukocytes, UA Trace   POCT UA - Microscopic Only  Result Value Ref Range   WBC, Ur, HPF, POC 0-1    RBC, urine, microscopic neg    Bacteria, U Microscopic neg    Mucus, UA neg    Epithelial cells, urine per micros 0-1    Crystals, Ur, HPF, POC neg    Casts, Ur, LPF, POC neg    Yeast, UA neg    [facility] reading (PRIMARY) by  Dr. Joseph Art:  CXR:  COPD with chronic left pleural scarring.  No acute changes.       Assessment & Plan:   This chart was scribed in my presence and reviewed by me personally.    ICD-9-CM ICD-10-CM   1. Loss of weight 783.21 P53.0 COMPLETE METABOLIC PANEL WITH GFR     POCT CBC     POCT urinalysis dipstick     POCT UA - Microscopic Only     DG Chest 2 View     Vitamin B12     POCT SEDIMENTATION RATE  2. Moderate malnutrition 263.0 Y51.1 COMPLETE METABOLIC PANEL WITH GFR      POCT CBC     POCT urinalysis dipstick     POCT UA - Microscopic Only     DG Chest 2 View     Vitamin B12  3. S/P gastrectomy V45.89 M21.11 COMPLETE METABOLIC PANEL WITH GFR     POCT CBC     POCT urinalysis dipstick     POCT UA - Microscopic Only     DG Chest 2 View     Vitamin B12  4. COPD exacerbation 491.21 N35.6 COMPLETE METABOLIC PANEL WITH GFR     POCT CBC     POCT urinalysis dipstick     POCT UA - Microscopic Only     DG Chest 2 View     POCT SEDIMENTATION RATE  5. Pain, joint, multiple sites 719.49 M25.50    I am concerned that patient is still losing weight with a wasting illness it's most likely secondary to the COPD. I've asked her to come back in 48 hours if the Depo-Medrol is not making a difference.  Signed, Robyn Haber, MD

## 2014-08-02 ENCOUNTER — Other Ambulatory Visit: Payer: Self-pay | Admitting: Internal Medicine

## 2014-08-02 NOTE — Telephone Encounter (Signed)
Pt called in and would like [name] to call in a cheaper medication instead of Librax. She can be reached @ [phone] Thank you

## 2014-08-03 MED ORDER — DICYCLOMINE HCL 20 MG PO TABS: 20.0000 mg | ORAL_TABLET | Freq: Three times a day (TID) | ORAL | Status: DC

## 2014-08-03 NOTE — Telephone Encounter (Signed)
Librax to bentyl $$

## 2014-08-03 NOTE — Addendum Note (Signed)
Addended by: [name] Koyanagi on: 08/03/2014 04:05 PM   Modules accepted: Orders

## 2014-08-04 NOTE — Telephone Encounter (Signed)
Spoke with pt, advised Rx was sent in. Pt already picked up.

## 2014-08-04 NOTE — Telephone Encounter (Signed)
The Librax Rx had printed out also. Advised pt Bentyl was sent in and shredded Librax Rx.

## 2014-08-09 ENCOUNTER — Telehealth: Payer: Self-pay

## 2014-08-09 NOTE — Telephone Encounter (Signed)
Phone call from pt.  Reported increased symptoms with bilat legs.  C/o heaviness in bilateral lower extremities with walking.  Stated it is relieved with rest.  Stated the symptoms have worsened than her last appt. in January of 2016. Reported that her left heel to left arch is darker than the right foot.  Stated she has "leg pain sometimes at rest."  Denies any open sores.  Stated there is "burning" in her feet.  Requesting to move her appt. up from the July follow-up appt.   Advised will call her back tomorrow with an earlier appt. for evaluation.   Agrees with plan.

## 2014-08-10 NOTE — Telephone Encounter (Signed)
I spoke with Cynthia Bowen to schedule for 04/22 @130pm - dpm

## 2014-08-12 ENCOUNTER — Encounter: Payer: Self-pay | Admitting: Family

## 2014-08-13 ENCOUNTER — Ambulatory Visit (HOSPITAL_COMMUNITY)
Admission: RE | Admit: 2014-08-13 | Discharge: 2014-08-13 | Disposition: A | Discharge status: Home / Self Care | Payer: Medicare Other | Source: Ambulatory Visit | Attending: Family | Admitting: Family

## 2014-08-13 ENCOUNTER — Ambulatory Visit (INDEPENDENT_AMBULATORY_CARE_PROVIDER_SITE_OTHER): Payer: Medicare Other | Admitting: Family

## 2014-08-13 ENCOUNTER — Other Ambulatory Visit: Payer: Self-pay

## 2014-08-13 ENCOUNTER — Encounter: Payer: Self-pay | Admitting: Family

## 2014-08-13 VITALS — BP 95/61 | HR 86 | Temp 98.6°F | Resp 14 | Ht 65.0 in | Wt 80.0 lb

## 2014-08-13 DIAGNOSIS — Z8679 Personal history of other diseases of the circulatory system: Secondary | ICD-10-CM

## 2014-08-13 DIAGNOSIS — L819 Disorder of pigmentation, unspecified: Secondary | ICD-10-CM | POA: Insufficient documentation

## 2014-08-13 DIAGNOSIS — I70229 Atherosclerosis of native arteries of extremities with rest pain, unspecified extremity: Secondary | ICD-10-CM

## 2014-08-13 DIAGNOSIS — L97909 Non-pressure chronic ulcer of unspecified part of unspecified lower leg with unspecified severity: Secondary | ICD-10-CM

## 2014-08-13 DIAGNOSIS — R29898 Other symptoms and signs involving the musculoskeletal system: Secondary | ICD-10-CM | POA: Insufficient documentation

## 2014-08-13 DIAGNOSIS — I998 Other disorder of circulatory system: Secondary | ICD-10-CM | POA: Insufficient documentation

## 2014-08-13 DIAGNOSIS — M79672 Pain in left foot: Secondary | ICD-10-CM

## 2014-08-13 DIAGNOSIS — I70299 Other atherosclerosis of native arteries of extremities, unspecified extremity: Secondary | ICD-10-CM

## 2014-08-13 DIAGNOSIS — I739 Peripheral vascular disease, unspecified: Secondary | ICD-10-CM | POA: Insufficient documentation

## 2014-08-13 DIAGNOSIS — I70219 Atherosclerosis of native arteries of extremities with intermittent claudication, unspecified extremity: Secondary | ICD-10-CM

## 2014-08-13 DIAGNOSIS — Z87891 Personal history of nicotine dependence: Secondary | ICD-10-CM | POA: Diagnosis not present

## 2014-08-13 NOTE — Patient Instructions (Signed)

## 2014-08-13 NOTE — Progress Notes (Signed)
[facility] [place] HISTORY AND PHYSICAL -PAD  History of Present Illness Cynthia Bowen is a 69 y.o. female patient of Dr. Bridgett Larsson who returns for 3 months evaluation of BLE intermittent claudication. She was seen by Dr. Bridgett Larsson in September 2015 for initial evaluation.  She returns today for follow up. She was faithful in her graduated walking program until about 3 weeks ago she developed a tight feeling in her waist to her legs and toes of both LE's, this is gradually worsening; this is at rest; if she tries to walk the tightness and pain are worse. Also about 3 weeks ago her left heel became red/cyanotic, small laceration on left heel is not healing.   Onset of symptom occurred after her abdominal surgery. At her September 2015 visit her pain was described as cramping, severity 3-5/10, and associated with walking >50 feet. Patient attempted to treat this pain with rest. Atherosclerotic risk factors include: former smoker, quit in 2006.   The patient is currently not on a statin as not indicated.  The patient is currently not on an anti-platelet: due to bleeding risk with recent gastric perforation.  Pt denies any history of stroke or TIA, denies any cardiac problems. Pt states she has a big appetite, she is not able to gain weight, is not losing weight. She denies post prandial abdominal pain but is unable to eat much since so much of her stomach was removed.  The patient reports New Medical or Surgical History: was hospitalized at [facility] overnight for evaluation of swollen right hand; pt does not know if any etiology was found for this, swelling resolved with medication including prednisone; she has known arthritis.   Pt Diabetic: No Former smoker, quit in 2006, started in her teens.   Past Medical History  Diagnosis Date  . COPD (chronic obstructive pulmonary disease)   . Pneumonia 12-2011  . GERD (gastroesophageal reflux disease)   . Headache(784.0)   .  Arthritis     osteoarthritis  . Allergy   . Depression   . Neuromuscular disorder   . Osteoporosis   . Bronchitis     CURRENTLY AS OF 06/30/12 - HAS COUGH AND FINISHED ANTIBIOTIC FOR BRONCHITIS  . Fibromyalgia   . Pain     ABDOMINAL PAIN AND NAUSEA  . Pain     SOMETIMES PAIN RIGHT EAR AND NECK--STATES CAUSED BY A "LUMP" ON BACK OF EAR--USES KENALOG CREAM TOPICALLY AS NEEDED.  Marland Kitchen Gastrocutaneous fistula   . Anemia   . Anxiety   . Blood transfusion without reported diagnosis     Social History History  Substance Use Topics  . Smoking status: Former Smoker -- 0.50 packs/day for 35 years    Types: Cigarettes    Start date: 01/04/2003    Quit date: 04/17/2005  . Smokeless tobacco: Never Used  . Alcohol Use: No    Family History Family History  Problem Relation Age of Onset  . COPD Mother   . Hyperlipidemia Mother   . Hypertension Maternal Grandmother   . Stroke Maternal Grandmother   . Colon cancer Neg Hx     Past Surgical History  Procedure Laterality Date  . Abdominal hysterectomy    . Esophagogastroduodenoscopy  04/18/2012    Procedure: ESOPHAGOGASTRODUODENOSCOPY (EGD);  Surgeon: Inda Castle, MD;  Location: [facility];  Service: Endoscopy;  Laterality: N/A;  . Eus  05/29/2012    Procedure: UPPER ENDOSCOPIC ULTRASOUND (EUS) LINEAR;  Surgeon: Milus Banister, MD;  Location: [facility];  Service: Endoscopy;  Laterality: N/A;  . Appendectomy    . Spine surgery      CERVICAL SPINE SURGERY X 2 - INCLUDING FUSION; LUMBAR SURGERY FOR RUPTURED DISC  . Eye surgery      BILATERAL CATARACT EXTRACTIONS  . Laparoscopic abdominal exploration N/A 07/02/2012    Procedure: converted to laparotomy with gastric biopsy;  Surgeon: Imogene Burn. Georgette Dover, MD;  Location: [facility];  Service: General;  Laterality: N/A;  Laparoscopic Gastric Biospy attempted.   . Laparotomy N/A 07/07/2012    Procedure: EXPLORATORY LAPAROTOMY repair of gastric perforation with omental graham patch, drainage of  abdominal abcess;  Surgeon: Imogene Burn. Georgette Dover, MD;  Location: [facility];  Service: General;  Laterality: N/A;  . Stomach surgery  07/07/2012    Omental patch of gastric perforation  . Laparotomy N/A 07/13/2012    Procedure: EXPLORATORY LAPAROTOMY repair gastric leak;  Surgeon: Edward Jolly, MD;  Location: [facility];  Service: General;  Laterality: N/A;  . Laparotomy N/A 11/11/2012    Procedure: EXPLORATORY LAPAROTOMY;  Surgeon: Imogene Burn. Georgette Dover, MD;  Location: [place];  Service: General;  Laterality: N/A;  . Bowel resection N/A 11/11/2012    Procedure: SMALL BOWEL RESECTION;  Surgeon: Imogene Burn. Georgette Dover, MD;  Location: [place];  Service: General;  Laterality: N/A;  . Minor application of wound vac N/A 11/11/2012    Procedure: APPLICATION OF WOUND VAC;  Surgeon: Imogene Burn. Georgette Dover, MD;  Location: [place];  Service: General;  Laterality: N/A;  . Jejunostomy N/A 11/11/2012    Procedure: PLACEMENT OF FEEDING JEJUNOSTOMY TUBE;  Surgeon: Imogene Burn. Georgette Dover, MD;  Location: [place];  Service: General;  Laterality: N/A;  . Lysis of adhesion N/A 11/11/2012    Procedure: LYSIS OF ADHESION;  Surgeon: Imogene Burn. Georgette Dover, MD;  Location: [place];  Service: General;  Laterality: N/A;  . Hernia repair    . Gastric fistula repair      05/16/2013  . Colonoscopy    . Polypectomy    . Upper gastrointestinal endoscopy      Allergies  Allergen Reactions  . Avelox [Moxifloxacin Hcl In Nacl] Nausea And Vomiting  . Betadine [Povidone Iodine] Itching and Rash  . Alendronate Sodium Nausea And Vomiting  . Alendronate Sodium     Other reaction(s): Dizziness (intolerance)  . Aspirin Nausea Only  . Codeine Nausea And Vomiting  . Doxycycline     Pt doesn't remember reaction  . Fluconazole     Pt doesn't remember reaction  . Hydrocodone Nausea And Vomiting    GI distress  . Hydrocodone-Acetaminophen Nausea And Vomiting  . Latex   . Neurontin [Gabapentin] Other (See Comments)    Mood changes   . Nsaids Other (See Comments)    Severe  gastritis & perforation - avoid NSAIDs when possible  . Quinolones Itching and Hives  . Sertraline Hcl Other (See Comments)    Hallucinations   . Sertraline Hcl Nausea And Vomiting  . Sulfamethoxazole Itching and Hives  . Sulfa Antibiotics Rash    Current Outpatient Prescriptions  Medication Sig Dispense Refill  . clidinium-chlordiazePOXIDE (LIBRAX) 5-2.5 MG per capsule TAKE ONE CAPSULE BY MOUTH 4 TIMES A DAY BEFORE MEALS AND AT BEDTIME, AS NEEDED FOR ABDOMINAL PAIN 120 capsule 3  . dicyclomine (BENTYL) 20 MG tablet Take 1 tablet (20 mg total) by mouth 4 (four) times daily -  before meals and at bedtime. 60 tablet 5  . fluocinonide cream (LIDEX) 7.12 % Apply 1 application topically 2 (  two) times daily as needed (rash).    . ondansetron (ZOFRAN) 4 MG tablet TAKE 1 TABLET (4 MG TOTAL) BY MOUTH EVERY 8 (EIGHT) HOURS AS NEEDED FOR NAUSEA OR VOMITING. 60 tablet 1  . oxyCODONE-acetaminophen (ROXICET) 5-325 MG per tablet Take 1 tablet by mouth every 8 (eight) hours as needed for severe pain. 30 tablet 0  . polyethylene glycol powder (GLYCOLAX/MIRALAX) powder Take 17 g by mouth daily. 1700 g 1  . albuterol (PROVENTIL HFA;VENTOLIN HFA) 108 (90 BASE) MCG/ACT inhaler Inhale 2 puffs into the lungs every 6 (six) hours as needed for wheezing. (Patient not taking: Reported on 06/25/2014) 1 Inhaler 2  . CALCIUM PO Take 1 tablet by mouth daily.    . diphenhydrAMINE (BENADRYL) 12.5 MG/5ML liquid Take 12.5 mg by mouth 4 (four) times daily as needed for itching.     Marland Kitchen EPINEPHrine (EPIPEN) 0.3 mg/0.3 mL IJ SOAJ injection Inject 0.3 mLs (0.3 mg total) into the muscle daily as needed (anaphylaxis). (Patient not taking: Reported on 08/13/2014) 1 Device 3  . feeding supplement, RESOURCE BREEZE, (RESOURCE BREEZE) LIQD Take 1 Container by mouth 3 (three) times daily between meals. (Patient not taking: Reported on 06/25/2014)    . fluconazole (DIFLUCAN) 150 MG tablet Take 1 tablet (150 mg total) by mouth once. (Patient not  taking: Reported on 06/25/2014) 1 tablet 0  . ketoconazole (NIZORAL) 2 % shampoo Apply 1 application topically 2 (two) times a week. (Patient not taking: Reported on 06/25/2014) 120 mL 0  . metroNIDAZOLE (FLAGYL) 500 MG tablet Take 1 tablet (500 mg total) by mouth every 8 (eight) hours. (Patient not taking: Reported on 06/25/2014) 36 tablet 0  . Multiple Vitamin (MULTIVITAMIN) tablet Take 1 tablet by mouth daily.    Marland Kitchen omeprazole (PRILOSEC) 40 MG capsule Take 1 capsule (40 mg total) by mouth daily. (Patient not taking: Reported on 06/25/2014) 15 capsule 0  . potassium chloride SA (K-DUR,KLOR-CON) 20 MEQ tablet Take 1 tablet BID for 2 days, Take 1 tablet once a day for 4 days (Patient not taking: Reported on 06/25/2014) 15 tablet 0  . predniSONE (DELTASONE) 10 MG tablet Take 3 tabs daily 3 days, then 2 tabs daily 3 days, then 1 tab daily 3 days, then stop. (Patient not taking: Reported on 06/25/2014) 18 tablet 0  . Probiotic Product (PROBIOTIC DAILY PO) Take by mouth.    . ranitidine (ZANTAC) 150 MG tablet Take 1 tablet (150 mg total) by mouth 2 (two) times daily. (Patient not taking: Reported on 08/13/2014) 180 tablet 1  . saccharomyces boulardii (FLORASTOR) 250 MG capsule Take 1 capsule (250 mg total) by mouth 2 (two) times daily. (Patient not taking: Reported on 06/25/2014) 60 capsule 0  . Simethicone (GAS-X EXTRA STRENGTH PO) Take by mouth.    . sucralfate (CARAFATE) 1 G tablet Take 1 tablet (1 g total) by mouth 4 (four) times daily -  with meals and at bedtime. (Patient not taking: Reported on 06/25/2014) 20 tablet 0  . sucralfate (CARAFATE) 1 G tablet Take 1 tablet (1 g total) by mouth 4 (four) times daily -  with meals and at bedtime. (Patient not taking: Reported on 07/16/2014) 28 tablet 0  . Vitamin D, Ergocalciferol, (DRISDOL) 50000 UNITS CAPS capsule Take 1 capsule (50,000 Units total) by mouth every 7 (seven) days. (Patient not taking: Reported on 08/13/2014) 12 capsule 3   No current facility-administered  medications for this visit.    ROS: See HPI for pertinent positives and negatives.   Physical  Examination  Filed Vitals:   08/13/14 1407  BP: 95/61  Pulse: 86  Temp: 98.6 F (37 C)  TempSrc: Oral  Resp: 14  Height: 5\' 5"  (1.651 m)  Weight: 80 lb (36.288 kg)  SpO2: 98%   Body mass index is 13.31 kg/(m^2).  General: A&O x 3, WD, Cachectic, voice is husky  Eyes: PERRLA  Pulmonary: Sym exp, good air movt, CTAB, no rales, rhonchi, & wheezing  Cardiac: RRR, Nl S1, S2, no detected murmur  Vascular: Vessel Right Left  Radial 2+Palpable Not Palpable  Brachial 2+Palpable 2+Palpable  Carotid Palpable, without bruit Palpable, without bruit  Aorta Not palpable N/A  Femoral Not Palpable not Palpable  Popliteal Palpable Not palpable  PT Not Palpable Not Palpable  DP not Palpable Not Palpable   Gastrointestinal: soft, NTND, -G/R, - HSM, - palpable masses, - CVAT B.  Musculoskeletal: M/S 5/5 throughout, left heel is red/slightly cyanotic, left toes are dusky. Shallow small laceration on left heel, no signs of infection.  Neurologic: CN 2-12 intact , Painful to light touch in left lower leg and foot , Motor exam as listed above  Psychiatric: Judgment intact, Mood & affect appropriatefor pt's clinical situation         Non-Invasive Vascular Imaging: DATE: 08/13/2014 ABI: RIGHT: 0.29 (04/30/14, 0.62), Waveforms: none detected and monophasic, TBI: 0.25;  LEFT: none (0.67), Waveforms: not detected. Bilateral common femoral arteries are monophasic.   ASSESSMENT: Cynthia Bowen is a 69 y.o. female who presents with worsening claudication, rest pain from waist to both legs and feet. No Doppler detected pulses in left leg. No Doppler detected pulses in right leg except for monophasic anterior tibial. Significant deterioration in perfusion of both lower extremities in the last three months, critical bilateral lower extremity  ischemia.  PLAN:  I discussed in depth with the patient the nature of atherosclerosis, and emphasized the importance of maximal medical management including strict control of blood pressure, blood glucose, and lipid levels, obtaining regular exercise, and continued cessation of smoking.  The patient is aware that without maximal medical management the underlying atherosclerotic disease process will progress, limiting the benefit of any interventions.  Based on the patient's vascular studies and examination, pt will be scheduled for arteriogram on Monday April 25 with Dr. Bridgett Larsson, possible intervention  The patient was given information about PAD including signs, symptoms, treatment, what symptoms should prompt the patient to seek immediate medical care, and risk reduction measures to take.  Clemon Chambers, RN, MSN, FNP-C [facility] Phone: [phone]  Clinic MD: Scot Dock  08/13/2014 2:24 PM

## 2014-08-16 ENCOUNTER — Telehealth: Payer: Self-pay | Admitting: Radiology

## 2014-08-16 NOTE — Telephone Encounter (Signed)
Referrals need to process this request since they set up the appt.

## 2014-08-16 NOTE — Telephone Encounter (Signed)
[facility] called and they need all records faxed regarding pt's abd pain. She has an appt tomorrow. Fax # is [phone].

## 2014-08-18 MED ORDER — SODIUM CHLORIDE 0.9 % IV SOLN
INTRAVENOUS | Status: DC
  Administered 2014-08-19: 07:00:00 via INTRAVENOUS

## 2014-08-19 ENCOUNTER — Ambulatory Visit (HOSPITAL_COMMUNITY)
Admission: RE | Admit: 2014-08-19 | Discharge: 2014-08-19 | Disposition: A | Discharge status: Home / Self Care | Payer: Medicare Other | Source: Ambulatory Visit | Attending: Vascular Surgery | Admitting: Vascular Surgery

## 2014-08-19 ENCOUNTER — Encounter (HOSPITAL_COMMUNITY)
Admission: RE | Disposition: A | Discharge status: Home / Self Care | Payer: Self-pay | Source: Ambulatory Visit | Attending: Vascular Surgery

## 2014-08-19 ENCOUNTER — Encounter (HOSPITAL_COMMUNITY): Payer: Self-pay | Admitting: Vascular Surgery

## 2014-08-19 DIAGNOSIS — K219 Gastro-esophageal reflux disease without esophagitis: Secondary | ICD-10-CM | POA: Insufficient documentation

## 2014-08-19 DIAGNOSIS — M797 Fibromyalgia: Secondary | ICD-10-CM | POA: Diagnosis not present

## 2014-08-19 DIAGNOSIS — J449 Chronic obstructive pulmonary disease, unspecified: Secondary | ICD-10-CM | POA: Diagnosis not present

## 2014-08-19 DIAGNOSIS — I70219 Atherosclerosis of native arteries of extremities with intermittent claudication, unspecified extremity: Secondary | ICD-10-CM | POA: Diagnosis present

## 2014-08-19 DIAGNOSIS — I998 Other disorder of circulatory system: Secondary | ICD-10-CM | POA: Diagnosis not present

## 2014-08-19 DIAGNOSIS — I70213 Atherosclerosis of native arteries of extremities with intermittent claudication, bilateral legs: Secondary | ICD-10-CM | POA: Insufficient documentation

## 2014-08-19 DIAGNOSIS — Z87891 Personal history of nicotine dependence: Secondary | ICD-10-CM | POA: Diagnosis not present

## 2014-08-19 DIAGNOSIS — I350 Nonrheumatic aortic (valve) stenosis: Secondary | ICD-10-CM | POA: Insufficient documentation

## 2014-08-19 DIAGNOSIS — I7092 Chronic total occlusion of artery of the extremities: Secondary | ICD-10-CM | POA: Diagnosis not present

## 2014-08-19 HISTORY — PX: ABDOMINAL AORTAGRAM: SHX5454

## 2014-08-19 LAB — POCT I-STAT, CHEM 8
BUN: 18 mg/dL (ref 6–23)
CHLORIDE: 106 mmol/L (ref 96–112)
Calcium, Ion: 1.21 mmol/L (ref 1.13–1.30)
Creatinine, Ser: 0.7 mg/dL (ref 0.50–1.10)
GLUCOSE: 90 mg/dL (ref 70–99)
HCT: 48 % — ABNORMAL HIGH (ref 36.0–46.0)
Hemoglobin: 16.3 g/dL — ABNORMAL HIGH (ref 12.0–15.0)
Potassium: 3.6 mmol/L (ref 3.5–5.1)
Sodium: 144 mmol/L (ref 135–145)
TCO2: 23 mmol/L (ref 0–100)

## 2014-08-19 SURGERY — ABDOMINAL AORTAGRAM
Anesthesia: LOCAL

## 2014-08-19 MED ORDER — TRAMADOL HCL 50 MG PO TABS
50.0000 mg | ORAL_TABLET | Freq: Four times a day (QID) | ORAL | Status: DC | PRN
  Filled 2014-08-19: qty 1

## 2014-08-19 MED ORDER — LIDOCAINE HCL (PF) 1 % IJ SOLN
INTRAMUSCULAR | Status: AC
  Filled 2014-08-19: qty 30

## 2014-08-19 MED ORDER — ACETAMINOPHEN 325 MG PO TABS: 650.0000 mg | ORAL_TABLET | ORAL | Status: DC | PRN

## 2014-08-19 MED ORDER — TRAMADOL HCL 50 MG PO TABS: 50.0000 mg | ORAL_TABLET | Freq: Four times a day (QID) | ORAL | Status: DC | PRN

## 2014-08-19 MED ORDER — FENTANYL CITRATE (PF) 100 MCG/2ML IJ SOLN
INTRAMUSCULAR | Status: AC
  Filled 2014-08-19: qty 2

## 2014-08-19 MED ORDER — SODIUM CHLORIDE 0.9 % IV SOLN: 1.0000 mL/kg/h | INTRAVENOUS | Status: DC

## 2014-08-19 MED ORDER — HEPARIN (PORCINE) IN NACL 2-0.9 UNIT/ML-% IJ SOLN
INTRAMUSCULAR | Status: AC
  Filled 2014-08-19: qty 1500

## 2014-08-19 MED ORDER — MIDAZOLAM HCL 2 MG/2ML IJ SOLN
INTRAMUSCULAR | Status: AC
  Filled 2014-08-19: qty 2

## 2014-08-19 NOTE — Op Note (Addendum)
OPERATIVE NOTE   PROCEDURE: 1.  Right common femoral artery cannulation under ultrasound guidance 2.  Placement of catheter in aorta 3.  Aortogram 4.  Bilateral runoff via catheter  PRE-OPERATIVE DIAGNOSIS: Bilateral leg critical limb ischemia   POST-OPERATIVE DIAGNOSIS: same as above , severe aortic stenosis  SURGEON: Adele Barthel, MD  ANESTHESIA: conscious sedation  ESTIMATED BLOOD LOSS: 50 cc  CONTRAST: 110 cc  FINDING(S):  Aorta: Patent with distal 1/3 segment 50-75% stenosis, stenosis just distal to stenosis  Superior mesenteric artery: Patent Celiac artery: Patent  Inferior mesenteric artery: Patent  Right Left  RA Patent Patent  CIA Patent Patent  EIA Patent Patent  IIA Patent Patent  CFA Patent, calcified, ~50% stenosis Patent  SFA Occluded Occluded  PFA Patent Patent  Pop Reconstitutes slightly above knee via profunda collaterals, less well visualized Reconstitutes slightly above knee via profunda collaterals  Trif Occluded Occluded  AT Patent, underfilled Patent  Pero Occluded Occluded  PT Occluded Occluded   SPECIMEN(S):  none  INDICATIONS:   Cynthia Bowen is a 69 y.o. female who presents with bilateral worsening ABI now consistent with critical limb ischemia.  The patient presents for: aortogram, bilateral leg runoff, and possible intervention.  I discussed with the patient the nature of angiographic procedures, especially the limited patencies of any endovascular intervention.  The patient is aware of that the risks of an angiographic procedure include but are not limited to: bleeding, infection, access site complications, renal failure, embolization, rupture of vessel, dissection, possible need for emergent surgical intervention, possible need for surgical procedures to treat the patient's pathology, and stroke and death.  The patient is aware of the risks and agrees to proceed.  DESCRIPTION: After full informed consent was obtained from the patient,  the patient was brought back to the angiography suite.  The patient was placed supine upon the angiography table and connected to monitoring equipment.  The patient was then given conscious sedation, the amounts of which are documented in the patient's chart.  The patient was prepped and drape in the standard fashion for an angiographic procedure.  At this point, attention was turned to the right groin.  Under ultrasound guidance, the subcutaneous tissue surrounding the right common femoral artery was anesthesized with 1% lidocaine with epinephrine.  The artery was then cannulated with a micropuncture needle.  The microwire was advanced into the iliac arterial system.  The needle was exchanged for a microsheath, which was loaded into the common femoral artery over the wire.  The microwire was exchanged for a [facility] wire which was advanced into the aorta.  The microsheath was then exchanged for a 5-Fr sheath which was loaded into the common femoral artery.  The Omniflush catheter was then loaded over the wire up to the level of T12.  The catheter was connected to the power injector circuit.  After de-airring and de-clotting the circuit, a power injector aortogram was completed.  The stenotic aortic segment was ~50-75% on lateral, >90% on AP.  Aortic segment proximal is 9 mm and distal is 10 mm with ~3.5 mm length  I then tried to select the left common femoral artery but the tight distal aorta prevented the Omniflush from fully forming.  Similarly two other crossing catheter failed to fully expand and allow contralateral common iliac artery selection.  I replaced the Omniflush and performed an automated bilateral leg runoff.  The findings are listed above.  Based on the images, I would stent the aortic stenosis with  a covered stent.  Unfortunately, I don't have an adequately large covered stent to treat this stenosis.  I will have to order this stent and arrange for its' placement in the OR, preferrably during  the next week.  COMPLICATIONS: none  CONDITION: stable   Adele Barthel, MD [facility] [place] Office: [phone] Pager: [phone]  08/19/2014, 10:19 AM

## 2014-08-19 NOTE — Interval H&P Note (Signed)
[facility] [place]  History and Physical Update  The patient was interviewed and re-examined.  The patient's previous History and Physical has been reviewed and is unchanged from my NP's consult.  There is no change in the plan of care: aortogram, bilateral leg runoff, and possible intervention.  I discussed with the patient the nature of angiographic procedures, especially the limited patencies of any endovascular intervention.  The patient is aware of that the risks of an angiographic procedure include but are not limited to: bleeding, infection, access site complications, renal failure, embolization, rupture of vessel, dissection, possible need for emergent surgical intervention, possible need for surgical procedures to treat the patient's pathology, anaphylactic reaction to contrast, and stroke and death.  The patient is aware of the risks and agrees to proceed.   Adele Barthel, MD [facility] [place] Office: [phone] Pager: [phone]  08/19/2014, 8:35 AM

## 2014-08-19 NOTE — Progress Notes (Signed)
Site area: RFA Site Prior to Removal:  Level 0 Pressure Applied For:72min Manual:   yes Patient Status During Pull: stable  Post Pull Site:  Level 0 Post Pull Instructions Given:  yes Post Pull Pulses Present: doppler-weak Dressing Applied:   Bedrest begins @ 9163 Comments:only very light pressure required and used

## 2014-08-19 NOTE — H&P (View-Only) (Signed)
[facility] [place] HISTORY AND PHYSICAL -PAD  History of Present Illness Cynthia Bowen is a 69 y.o. female patient of Dr. Bridgett Larsson who returns for 3 months evaluation of BLE intermittent claudication. She was seen by Dr. Bridgett Larsson in September 2015 for initial evaluation.  She returns today for follow up. She was faithful in her graduated walking program until about 3 weeks ago she developed a tight feeling in her waist to her legs and toes of both LE's, this is gradually worsening; this is at rest; if she tries to walk the tightness and pain are worse. Also about 3 weeks ago her left heel became red/cyanotic, small laceration on left heel is not healing.   Onset of symptom occurred after her abdominal surgery. At her September 2015 visit her pain was described as cramping, severity 3-5/10, and associated with walking >50 feet. Patient attempted to treat this pain with rest. Atherosclerotic risk factors include: former smoker, quit in 2006.   The patient is currently not on a statin as not indicated.  The patient is currently not on an anti-platelet: due to bleeding risk with recent gastric perforation.  Pt denies any history of stroke or TIA, denies any cardiac problems. Pt states she has a big appetite, she is not able to gain weight, is not losing weight. She denies post prandial abdominal pain but is unable to eat much since so much of her stomach was removed.  The patient reports New Medical or Surgical History: was hospitalized at [facility] overnight for evaluation of swollen right hand; pt does not know if any etiology was found for this, swelling resolved with medication including prednisone; she has known arthritis.   Pt Diabetic: No Former smoker, quit in 2006, started in her teens.   Past Medical History  Diagnosis Date  . COPD (chronic obstructive pulmonary disease)   . Pneumonia 12-2011  . GERD (gastroesophageal reflux disease)   . Headache(784.0)   .  Arthritis     osteoarthritis  . Allergy   . Depression   . Neuromuscular disorder   . Osteoporosis   . Bronchitis     CURRENTLY AS OF 06/30/12 - HAS COUGH AND FINISHED ANTIBIOTIC FOR BRONCHITIS  . Fibromyalgia   . Pain     ABDOMINAL PAIN AND NAUSEA  . Pain     SOMETIMES PAIN RIGHT EAR AND NECK--STATES CAUSED BY A "LUMP" ON BACK OF EAR--USES KENALOG CREAM TOPICALLY AS NEEDED.  Marland Kitchen Gastrocutaneous fistula   . Anemia   . Anxiety   . Blood transfusion without reported diagnosis     Social History History  Substance Use Topics  . Smoking status: Former Smoker -- 0.50 packs/day for 35 years    Types: Cigarettes    Start date: 01/04/2003    Quit date: 04/17/2005  . Smokeless tobacco: Never Used  . Alcohol Use: No    Family History Family History  Problem Relation Age of Onset  . COPD Mother   . Hyperlipidemia Mother   . Hypertension Maternal Grandmother   . Stroke Maternal Grandmother   . Colon cancer Neg Hx     Past Surgical History  Procedure Laterality Date  . Abdominal hysterectomy    . Esophagogastroduodenoscopy  04/18/2012    Procedure: ESOPHAGOGASTRODUODENOSCOPY (EGD);  Surgeon: Inda Castle, MD;  Location: [facility];  Service: Endoscopy;  Laterality: N/A;  . Eus  05/29/2012    Procedure: UPPER ENDOSCOPIC ULTRASOUND (EUS) LINEAR;  Surgeon: Milus Banister, MD;  Location: [facility];  Service: Endoscopy;  Laterality: N/A;  . Appendectomy    . Spine surgery      CERVICAL SPINE SURGERY X 2 - INCLUDING FUSION; LUMBAR SURGERY FOR RUPTURED DISC  . Eye surgery      BILATERAL CATARACT EXTRACTIONS  . Laparoscopic abdominal exploration N/A 07/02/2012    Procedure: converted to laparotomy with gastric biopsy;  Surgeon: Imogene Burn. Georgette Dover, MD;  Location: [facility];  Service: General;  Laterality: N/A;  Laparoscopic Gastric Biospy attempted.   . Laparotomy N/A 07/07/2012    Procedure: EXPLORATORY LAPAROTOMY repair of gastric perforation with omental graham patch, drainage of  abdominal abcess;  Surgeon: Imogene Burn. Georgette Dover, MD;  Location: [facility];  Service: General;  Laterality: N/A;  . Stomach surgery  07/07/2012    Omental patch of gastric perforation  . Laparotomy N/A 07/13/2012    Procedure: EXPLORATORY LAPAROTOMY repair gastric leak;  Surgeon: Edward Jolly, MD;  Location: [facility];  Service: General;  Laterality: N/A;  . Laparotomy N/A 11/11/2012    Procedure: EXPLORATORY LAPAROTOMY;  Surgeon: Imogene Burn. Georgette Dover, MD;  Location: [place];  Service: General;  Laterality: N/A;  . Bowel resection N/A 11/11/2012    Procedure: SMALL BOWEL RESECTION;  Surgeon: Imogene Burn. Georgette Dover, MD;  Location: [place];  Service: General;  Laterality: N/A;  . Minor application of wound vac N/A 11/11/2012    Procedure: APPLICATION OF WOUND VAC;  Surgeon: Imogene Burn. Georgette Dover, MD;  Location: [place];  Service: General;  Laterality: N/A;  . Jejunostomy N/A 11/11/2012    Procedure: PLACEMENT OF FEEDING JEJUNOSTOMY TUBE;  Surgeon: Imogene Burn. Georgette Dover, MD;  Location: [place];  Service: General;  Laterality: N/A;  . Lysis of adhesion N/A 11/11/2012    Procedure: LYSIS OF ADHESION;  Surgeon: Imogene Burn. Georgette Dover, MD;  Location: [place];  Service: General;  Laterality: N/A;  . Hernia repair    . Gastric fistula repair      05/16/2013  . Colonoscopy    . Polypectomy    . Upper gastrointestinal endoscopy      Allergies  Allergen Reactions  . Avelox [Moxifloxacin Hcl In Nacl] Nausea And Vomiting  . Betadine [Povidone Iodine] Itching and Rash  . Alendronate Sodium Nausea And Vomiting  . Alendronate Sodium     Other reaction(s): Dizziness (intolerance)  . Aspirin Nausea Only  . Codeine Nausea And Vomiting  . Doxycycline     Pt doesn't remember reaction  . Fluconazole     Pt doesn't remember reaction  . Hydrocodone Nausea And Vomiting    GI distress  . Hydrocodone-Acetaminophen Nausea And Vomiting  . Latex   . Neurontin [Gabapentin] Other (See Comments)    Mood changes   . Nsaids Other (See Comments)    Severe  gastritis & perforation - avoid NSAIDs when possible  . Quinolones Itching and Hives  . Sertraline Hcl Other (See Comments)    Hallucinations   . Sertraline Hcl Nausea And Vomiting  . Sulfamethoxazole Itching and Hives  . Sulfa Antibiotics Rash    Current Outpatient Prescriptions  Medication Sig Dispense Refill  . clidinium-chlordiazePOXIDE (LIBRAX) 5-2.5 MG per capsule TAKE ONE CAPSULE BY MOUTH 4 TIMES A DAY BEFORE MEALS AND AT BEDTIME, AS NEEDED FOR ABDOMINAL PAIN 120 capsule 3  . dicyclomine (BENTYL) 20 MG tablet Take 1 tablet (20 mg total) by mouth 4 (four) times daily -  before meals and at bedtime. 60 tablet 5  . fluocinonide cream (LIDEX) 5.36 % Apply 1 application topically 2 (  two) times daily as needed (rash).    . ondansetron (ZOFRAN) 4 MG tablet TAKE 1 TABLET (4 MG TOTAL) BY MOUTH EVERY 8 (EIGHT) HOURS AS NEEDED FOR NAUSEA OR VOMITING. 60 tablet 1  . oxyCODONE-acetaminophen (ROXICET) 5-325 MG per tablet Take 1 tablet by mouth every 8 (eight) hours as needed for severe pain. 30 tablet 0  . polyethylene glycol powder (GLYCOLAX/MIRALAX) powder Take 17 g by mouth daily. 1700 g 1  . albuterol (PROVENTIL HFA;VENTOLIN HFA) 108 (90 BASE) MCG/ACT inhaler Inhale 2 puffs into the lungs every 6 (six) hours as needed for wheezing. (Patient not taking: Reported on 06/25/2014) 1 Inhaler 2  . CALCIUM PO Take 1 tablet by mouth daily.    . diphenhydrAMINE (BENADRYL) 12.5 MG/5ML liquid Take 12.5 mg by mouth 4 (four) times daily as needed for itching.     Marland Kitchen EPINEPHrine (EPIPEN) 0.3 mg/0.3 mL IJ SOAJ injection Inject 0.3 mLs (0.3 mg total) into the muscle daily as needed (anaphylaxis). (Patient not taking: Reported on 08/13/2014) 1 Device 3  . feeding supplement, RESOURCE BREEZE, (RESOURCE BREEZE) LIQD Take 1 Container by mouth 3 (three) times daily between meals. (Patient not taking: Reported on 06/25/2014)    . fluconazole (DIFLUCAN) 150 MG tablet Take 1 tablet (150 mg total) by mouth once. (Patient not  taking: Reported on 06/25/2014) 1 tablet 0  . ketoconazole (NIZORAL) 2 % shampoo Apply 1 application topically 2 (two) times a week. (Patient not taking: Reported on 06/25/2014) 120 mL 0  . metroNIDAZOLE (FLAGYL) 500 MG tablet Take 1 tablet (500 mg total) by mouth every 8 (eight) hours. (Patient not taking: Reported on 06/25/2014) 36 tablet 0  . Multiple Vitamin (MULTIVITAMIN) tablet Take 1 tablet by mouth daily.    Marland Kitchen omeprazole (PRILOSEC) 40 MG capsule Take 1 capsule (40 mg total) by mouth daily. (Patient not taking: Reported on 06/25/2014) 15 capsule 0  . potassium chloride SA (K-DUR,KLOR-CON) 20 MEQ tablet Take 1 tablet BID for 2 days, Take 1 tablet once a day for 4 days (Patient not taking: Reported on 06/25/2014) 15 tablet 0  . predniSONE (DELTASONE) 10 MG tablet Take 3 tabs daily 3 days, then 2 tabs daily 3 days, then 1 tab daily 3 days, then stop. (Patient not taking: Reported on 06/25/2014) 18 tablet 0  . Probiotic Product (PROBIOTIC DAILY PO) Take by mouth.    . ranitidine (ZANTAC) 150 MG tablet Take 1 tablet (150 mg total) by mouth 2 (two) times daily. (Patient not taking: Reported on 08/13/2014) 180 tablet 1  . saccharomyces boulardii (FLORASTOR) 250 MG capsule Take 1 capsule (250 mg total) by mouth 2 (two) times daily. (Patient not taking: Reported on 06/25/2014) 60 capsule 0  . Simethicone (GAS-X EXTRA STRENGTH PO) Take by mouth.    . sucralfate (CARAFATE) 1 G tablet Take 1 tablet (1 g total) by mouth 4 (four) times daily -  with meals and at bedtime. (Patient not taking: Reported on 06/25/2014) 20 tablet 0  . sucralfate (CARAFATE) 1 G tablet Take 1 tablet (1 g total) by mouth 4 (four) times daily -  with meals and at bedtime. (Patient not taking: Reported on 07/16/2014) 28 tablet 0  . Vitamin D, Ergocalciferol, (DRISDOL) 50000 UNITS CAPS capsule Take 1 capsule (50,000 Units total) by mouth every 7 (seven) days. (Patient not taking: Reported on 08/13/2014) 12 capsule 3   No current facility-administered  medications for this visit.    ROS: See HPI for pertinent positives and negatives.   Physical  Examination  Filed Vitals:   08/13/14 1407  BP: 95/61  Pulse: 86  Temp: 98.6 F (37 C)  TempSrc: Oral  Resp: 14  Height: 5\' 5"  (1.651 m)  Weight: 80 lb (36.288 kg)  SpO2: 98%   Body mass index is 13.31 kg/(m^2).  General: A&O x 3, WD, Cachectic, voice is husky  Eyes: PERRLA  Pulmonary: Sym exp, good air movt, CTAB, no rales, rhonchi, & wheezing  Cardiac: RRR, Nl S1, S2, no detected murmur  Vascular: Vessel Right Left  Radial 2+Palpable Not Palpable  Brachial 2+Palpable 2+Palpable  Carotid Palpable, without bruit Palpable, without bruit  Aorta Not palpable N/A  Femoral Not Palpable not Palpable  Popliteal Palpable Not palpable  PT Not Palpable Not Palpable  DP not Palpable Not Palpable   Gastrointestinal: soft, NTND, -G/R, - HSM, - palpable masses, - CVAT B.  Musculoskeletal: M/S 5/5 throughout, left heel is red/slightly cyanotic, left toes are dusky. Shallow small laceration on left heel, no signs of infection.  Neurologic: CN 2-12 intact , Painful to light touch in left lower leg and foot , Motor exam as listed above  Psychiatric: Judgment intact, Mood & affect appropriatefor pt's clinical situation         Non-Invasive Vascular Imaging: DATE: 08/13/2014 ABI: RIGHT: 0.29 (04/30/14, 0.62), Waveforms: none detected and monophasic, TBI: 0.25;  LEFT: none (0.67), Waveforms: not detected. Bilateral common femoral arteries are monophasic.   ASSESSMENT: MARSHAWN NINNEMAN is a 69 y.o. female who presents with worsening claudication, rest pain from waist to both legs and feet. No Doppler detected pulses in left leg. No Doppler detected pulses in right leg except for monophasic anterior tibial. Significant deterioration in perfusion of both lower extremities in the last three months, critical bilateral lower extremity  ischemia.  PLAN:  I discussed in depth with the patient the nature of atherosclerosis, and emphasized the importance of maximal medical management including strict control of blood pressure, blood glucose, and lipid levels, obtaining regular exercise, and continued cessation of smoking.  The patient is aware that without maximal medical management the underlying atherosclerotic disease process will progress, limiting the benefit of any interventions.  Based on the patient's vascular studies and examination, pt will be scheduled for arteriogram on Monday April 25 with Dr. Bridgett Larsson, possible intervention  The patient was given information about PAD including signs, symptoms, treatment, what symptoms should prompt the patient to seek immediate medical care, and risk reduction measures to take.  Cynthia Chambers, RN, MSN, FNP-C [facility] Phone: [phone]  Clinic MD: Scot Dock  08/13/2014 2:24 PM

## 2014-08-19 NOTE — Discharge Instructions (Signed)

## 2014-08-20 ENCOUNTER — Telehealth: Payer: Self-pay

## 2014-08-20 NOTE — Telephone Encounter (Signed)
Pt of Dr. Laney Pastor would like to go over her MRI results, and discuss possible alternatives to having stints put into her legs. Please advise

## 2014-08-20 NOTE — Telephone Encounter (Signed)
Pt had an arteriogram and they advised her that she needed stents. She wants to know if there's any other alternatives.

## 2014-08-23 ENCOUNTER — Other Ambulatory Visit: Payer: Self-pay

## 2014-08-23 NOTE — Telephone Encounter (Signed)
i read her procedure note--yes , stents would be the safest thing to do---only other alternative would be bypass grafts and that is major surgery

## 2014-08-23 NOTE — Telephone Encounter (Signed)
lmom of dr [employer] message and to cb if any ?'s.

## 2014-08-25 ENCOUNTER — Other Ambulatory Visit (HOSPITAL_COMMUNITY): Payer: Self-pay | Admitting: *Deleted

## 2014-08-25 ENCOUNTER — Encounter (HOSPITAL_COMMUNITY): Payer: Self-pay

## 2014-08-25 ENCOUNTER — Encounter (HOSPITAL_COMMUNITY)
Admission: RE | Admit: 2014-08-25 | Discharge: 2014-08-25 | Disposition: A | Discharge status: Home / Self Care | Payer: Medicare Other | Source: Ambulatory Visit | Attending: Vascular Surgery | Admitting: Vascular Surgery

## 2014-08-25 HISTORY — DX: Cardiac murmur, unspecified: R01.1

## 2014-08-25 HISTORY — DX: Personal history of urinary calculi: Z87.442

## 2014-08-25 HISTORY — DX: Peripheral vascular disease, unspecified: I73.9

## 2014-08-25 LAB — URINE MICROSCOPIC-ADD ON

## 2014-08-25 LAB — COMPREHENSIVE METABOLIC PANEL
ALBUMIN: 3.4 g/dL — AB (ref 3.5–5.0)
ALT: 21 U/L (ref 14–54)
AST: 23 U/L (ref 15–41)
Alkaline Phosphatase: 81 U/L (ref 38–126)
Anion gap: 9 (ref 5–15)
BILIRUBIN TOTAL: 0.3 mg/dL (ref 0.3–1.2)
BUN: 9 mg/dL (ref 6–20)
CHLORIDE: 111 mmol/L (ref 101–111)
CO2: 21 mmol/L — ABNORMAL LOW (ref 22–32)
Calcium: 9.2 mg/dL (ref 8.9–10.3)
Creatinine, Ser: 0.66 mg/dL (ref 0.44–1.00)
GFR calc Af Amer: >60 mL/min (ref >60)
GFR calc non Af Amer: >60 mL/min (ref >60)
Glucose, Bld: 128 mg/dL — ABNORMAL HIGH (ref 70–99)
POTASSIUM: 3.5 mmol/L (ref 3.5–5.1)
Sodium: 141 mmol/L (ref 135–145)
Total Protein: 6.6 g/dL (ref 6.5–8.1)

## 2014-08-25 LAB — URINALYSIS, ROUTINE W REFLEX MICROSCOPIC
BILIRUBIN URINE: NEGATIVE
GLUCOSE, UA: NEGATIVE mg/dL
HGB URINE DIPSTICK: NEGATIVE
Ketones, ur: NEGATIVE mg/dL
Nitrite: NEGATIVE
Protein, ur: NEGATIVE mg/dL
SPECIFIC GRAVITY, URINE: 1.014 (ref 1.005–1.030)
UROBILINOGEN UA: 0.2 mg/dL (ref 0.0–1.0)
pH: 5 (ref 5.0–8.0)

## 2014-08-25 LAB — PROTIME-INR
INR: 1.04 (ref 0.00–1.49)
Prothrombin Time: 13.7 seconds (ref 11.6–15.2)

## 2014-08-25 LAB — CBC
HEMATOCRIT: 36.1 % (ref 36.0–46.0)
Hemoglobin: 12.1 g/dL (ref 12.0–15.0)
MCH: 28.7 pg (ref 26.0–34.0)
MCHC: 33.5 g/dL (ref 30.0–36.0)
MCV: 85.7 fL (ref 78.0–100.0)
Platelets: 193 10*3/uL (ref 150–400)
RBC: 4.21 MIL/uL (ref 3.87–5.11)
RDW: 17 % — ABNORMAL HIGH (ref 11.5–15.5)
WBC: 8.8 10*3/uL (ref 4.0–10.5)

## 2014-08-25 LAB — SURGICAL PCR SCREEN
MRSA, PCR: NEGATIVE
Staphylococcus aureus: NEGATIVE

## 2014-08-25 LAB — APTT: aPTT: 26 seconds (ref 24–37)

## 2014-08-25 MED ORDER — SODIUM CHLORIDE 0.9 % IV SOLN: INTRAVENOUS | Status: DC

## 2014-08-25 MED ORDER — DEXTROSE 5 % IV SOLN
1.5000 g | INTRAVENOUS | Status: AC
  Administered 2014-08-26: 1.5 g via INTRAVENOUS
  Filled 2014-08-25: qty 1.5

## 2014-08-25 NOTE — Pre-Procedure Instructions (Addendum)
GIANNY KILLMAN  08/25/2014   Your procedure is scheduled on:  Thursday, Aug 26, 2014 .   Report to [facility] Entrance "A" Admitting Office at 9:30 AM.   Call this number if you have problems the morning of surgery: [phone]   Remember:   Do not eat food or drink liquids after midnight tonight.   Take these medicines the morning of surgery with A SIP OF WATER: inhaler and pain med if needed, zantac   STOP all herbel meds, nsaids (aleve,naproxen,advil,ibuprofen) NOW including vitamins,calcium, probiotic   Do not wear jewelry, make-up or nail polish.  Do not wear lotions, powders, or perfumes. You may NOT wear deodorant.  Do not shave 48 hours prior to surgery.   Do not bring valuables to the hospital.  [facility] is not responsible                  for any belongings or valuables.               Contacts, dentures or bridgework may not be worn into surgery.  Leave suitcase in the car. After surgery it may be brought to your room.  For patients admitted to the hospital, discharge time is determined by your                treatment team.               Special Instructions:  Special Instructions: [place] - Preparing for Surgery  Before surgery, you can play an important role.  Because skin is not sterile, your skin needs to be as free of germs as possible.  You can reduce the number of germs on you skin by washing with CHG (chlorahexidine gluconate) soap before surgery.  CHG is an antiseptic cleaner which kills germs and bonds with the skin to continue killing germs even after washing.  Please DO NOT use if you have an allergy to CHG or antibacterial soaps.  If your skin becomes reddened/irritated stop using the CHG and inform your nurse when you arrive at Short Stay.  Do not shave (including legs and underarms) for at least 48 hours prior to the first CHG shower.  You may shave your face.  Please follow these instructions carefully:   1.  Shower with CHG Soap the night  before surgery and the morning of Surgery.  2.  If you choose to wash your hair, wash your hair first as usual with your normal shampoo.  3.  After you shampoo, rinse your hair and body thoroughly to remove the Shampoo.  4.  Use CHG as you would any other liquid soap.  You can apply chg directly  to the skin and wash gently with scrungie or a clean washcloth.  5.  Apply the CHG Soap to your body ONLY FROM THE NECK DOWN.  Do not use on open wounds or open sores.  Avoid contact with your eyes ears, mouth and genitals (private parts).  Wash genitals (private parts)       with your normal soap.  6.  Wash thoroughly, paying special attention to the area where your surgery will be performed.  7.  Thoroughly rinse your body with warm water from the neck down.  8.  DO NOT shower/wash with your normal soap after using and rinsing off the CHG Soap.  9.  Pat yourself dry with a clean towel.            10.  Wear  clean pajamas.            11.  Place clean sheets on your bed the night of your first shower and do not sleep with pets.  Day of Surgery  Do not apply any lotions/deodorants the morning of surgery.  Please wear clean clothes to the hospital/surgery center.    Please read over the following fact sheets that you were given: Pain Booklet, Coughing and Deep Breathing, Blood Transfusion Information, MRSA Information and Surgical Site Infection Prevention

## 2014-08-26 ENCOUNTER — Encounter (HOSPITAL_COMMUNITY): Payer: Self-pay | Admitting: *Deleted

## 2014-08-26 ENCOUNTER — Inpatient Hospital Stay (HOSPITAL_COMMUNITY)
Admission: RE | Admit: 2014-08-26 | Discharge: 2014-08-28 | DRG: 221 | Disposition: A | Discharge status: Home / Self Care | Payer: Medicare Other | Source: Ambulatory Visit | Attending: Vascular Surgery | Admitting: Vascular Surgery

## 2014-08-26 ENCOUNTER — Inpatient Hospital Stay (HOSPITAL_COMMUNITY): Payer: Medicare Other

## 2014-08-26 ENCOUNTER — Inpatient Hospital Stay (HOSPITAL_COMMUNITY): Payer: Medicare Other | Admitting: Anesthesiology

## 2014-08-26 ENCOUNTER — Encounter (HOSPITAL_COMMUNITY)
Admission: RE | Disposition: A | Discharge status: Home / Self Care | Payer: Self-pay | Source: Ambulatory Visit | Attending: Vascular Surgery

## 2014-08-26 DIAGNOSIS — J449 Chronic obstructive pulmonary disease, unspecified: Secondary | ICD-10-CM | POA: Diagnosis present

## 2014-08-26 DIAGNOSIS — K219 Gastro-esophageal reflux disease without esophagitis: Secondary | ICD-10-CM | POA: Diagnosis present

## 2014-08-26 DIAGNOSIS — I350 Nonrheumatic aortic (valve) stenosis: Secondary | ICD-10-CM | POA: Diagnosis present

## 2014-08-26 DIAGNOSIS — Z87891 Personal history of nicotine dependence: Secondary | ICD-10-CM | POA: Diagnosis not present

## 2014-08-26 DIAGNOSIS — I70213 Atherosclerosis of native arteries of extremities with intermittent claudication, bilateral legs: Secondary | ICD-10-CM | POA: Diagnosis present

## 2014-08-26 DIAGNOSIS — I70209 Unspecified atherosclerosis of native arteries of extremities, unspecified extremity: Secondary | ICD-10-CM | POA: Diagnosis not present

## 2014-08-26 DIAGNOSIS — I998 Other disorder of circulatory system: Secondary | ICD-10-CM | POA: Diagnosis not present

## 2014-08-26 DIAGNOSIS — Z419 Encounter for procedure for purposes other than remedying health state, unspecified: Secondary | ICD-10-CM

## 2014-08-26 DIAGNOSIS — I739 Peripheral vascular disease, unspecified: Secondary | ICD-10-CM | POA: Diagnosis present

## 2014-08-26 HISTORY — PX: ABDOMINAL AORTIC ENDOVASCULAR STENT GRAFT: SHX5707

## 2014-08-26 LAB — CBC
HEMATOCRIT: 36.4 % (ref 36.0–46.0)
Hemoglobin: 12.5 g/dL (ref 12.0–15.0)
MCH: 29.1 pg (ref 26.0–34.0)
MCHC: 34.3 g/dL (ref 30.0–36.0)
MCV: 84.7 fL (ref 78.0–100.0)
Platelets: 205 10*3/uL (ref 150–400)
RBC: 4.3 MIL/uL (ref 3.87–5.11)
RDW: 16.9 % — AB (ref 11.5–15.5)
WBC: 9.4 10*3/uL (ref 4.0–10.5)

## 2014-08-26 LAB — MAGNESIUM: MAGNESIUM: 1.4 mg/dL — AB (ref 1.7–2.4)

## 2014-08-26 LAB — PROTIME-INR
INR: 1.04 (ref 0.00–1.49)
Prothrombin Time: 13.7 seconds (ref 11.6–15.2)

## 2014-08-26 LAB — BLOOD GAS, ARTERIAL
ACID-BASE EXCESS: 0.3 mmol/L (ref 0.0–2.0)
Bicarbonate: 24.5 mEq/L — ABNORMAL HIGH (ref 20.0–24.0)
DRAWN BY: 206361
O2 CONTENT: 2 L/min
O2 SAT: 94.6 %
PATIENT TEMPERATURE: 98.6
PO2 ART: 103 mmHg — AB (ref 80.0–100.0)
TCO2: 25.8 mmol/L (ref 0–100)
pCO2 arterial: 40.8 mmHg (ref 35.0–45.0)
pH, Arterial: 7.397 (ref 7.350–7.450)

## 2014-08-26 LAB — BASIC METABOLIC PANEL
Anion gap: 8 (ref 5–15)
BUN: <5 mg/dL — ABNORMAL LOW (ref 6–20)
CALCIUM: 8.6 mg/dL — AB (ref 8.9–10.3)
CO2: 25 mmol/L (ref 22–32)
Chloride: 107 mmol/L (ref 101–111)
Creatinine, Ser: 0.5 mg/dL (ref 0.44–1.00)
GFR calc non Af Amer: >60 mL/min (ref >60)
Glucose, Bld: 102 mg/dL — ABNORMAL HIGH (ref 70–99)
POTASSIUM: 3.2 mmol/L — AB (ref 3.5–5.1)
Sodium: 140 mmol/L (ref 135–145)

## 2014-08-26 LAB — APTT: APTT: 24 s (ref 24–37)

## 2014-08-26 LAB — TYPE AND SCREEN
ABO/RH(D): B POS
ANTIBODY SCREEN: NEGATIVE

## 2014-08-26 SURGERY — INSERTION, ENDOVASCULAR STENT GRAFT, AORTA, ABDOMINAL
Anesthesia: General | Site: Abdomen

## 2014-08-26 MED ORDER — PROPOFOL 10 MG/ML IV BOLUS
INTRAVENOUS | Status: DC | PRN
  Administered 2014-08-26: 50 mg via INTRAVENOUS
  Administered 2014-08-26: 100 mg via INTRAVENOUS

## 2014-08-26 MED ORDER — NITROGLYCERIN IN D5W 200-5 MCG/ML-% IV SOLN
2.0000 ug/min | INTRAVENOUS | Status: DC
  Filled 2014-08-26 (×2): qty 250

## 2014-08-26 MED ORDER — ONDANSETRON HCL 4 MG PO TABS: 4.0000 mg | ORAL_TABLET | Freq: Three times a day (TID) | ORAL | Status: DC | PRN

## 2014-08-26 MED ORDER — HYDRALAZINE HCL 20 MG/ML IJ SOLN
5.0000 mg | INTRAMUSCULAR | Status: AC | PRN
  Administered 2014-08-26: 5 mg via INTRAVENOUS
  Administered 2014-08-26: 16:00:00 via INTRAVENOUS
  Filled 2014-08-26 (×2): qty 0.25

## 2014-08-26 MED ORDER — ACETAMINOPHEN 650 MG RE SUPP: 325.0000 mg | RECTAL | Status: DC | PRN

## 2014-08-26 MED ORDER — LABETALOL HCL 5 MG/ML IV SOLN
10.0000 mg | INTRAVENOUS | Status: DC | PRN
  Filled 2014-08-26: qty 4

## 2014-08-26 MED ORDER — DIPHENHYDRAMINE HCL 12.5 MG/5ML PO LIQD
12.5000 mg | Freq: Four times a day (QID) | ORAL | Status: DC | PRN
  Filled 2014-08-26: qty 5

## 2014-08-26 MED ORDER — LACTATED RINGERS IV SOLN
INTRAVENOUS | Status: DC
  Administered 2014-08-26 (×2): via INTRAVENOUS

## 2014-08-26 MED ORDER — LACTATED RINGERS IV SOLN
INTRAVENOUS | Status: DC
  Administered 2014-08-26 (×2): via INTRAVENOUS

## 2014-08-26 MED ORDER — FENTANYL CITRATE (PF) 100 MCG/2ML IJ SOLN
INTRAMUSCULAR | Status: AC
  Administered 2014-08-26: 75 ug
  Filled 2014-08-26: qty 2

## 2014-08-26 MED ORDER — POLYETHYLENE GLYCOL 3350 17 GM/SCOOP PO POWD
17.0000 g | Freq: Every day | ORAL | Status: DC
  Administered 2014-08-27 (×2): 17 g via ORAL
  Filled 2014-08-26 (×3): qty 255

## 2014-08-26 MED ORDER — ONDANSETRON HCL 4 MG/2ML IJ SOLN
INTRAMUSCULAR | Status: DC | PRN
  Administered 2014-08-26: 4 mg via INTRAVENOUS

## 2014-08-26 MED ORDER — BISACODYL 10 MG RE SUPP: 10.0000 mg | Freq: Every day | RECTAL | Status: DC | PRN

## 2014-08-26 MED ORDER — FENTANYL CITRATE (PF) 100 MCG/2ML IJ SOLN
INTRAMUSCULAR | Status: DC | PRN
  Administered 2014-08-26: 100 ug via INTRAVENOUS
  Administered 2014-08-26 (×5): 50 ug via INTRAVENOUS

## 2014-08-26 MED ORDER — SENNOSIDES-DOCUSATE SODIUM 8.6-50 MG PO TABS
1.0000 | ORAL_TABLET | Freq: Every evening | ORAL | Status: DC | PRN
  Filled 2014-08-26: qty 1

## 2014-08-26 MED ORDER — ONDANSETRON HCL 4 MG/2ML IJ SOLN: 4.0000 mg | Freq: Once | INTRAMUSCULAR | Status: DC | PRN

## 2014-08-26 MED ORDER — ALUM & MAG HYDROXIDE-SIMETH 200-200-20 MG/5ML PO SUSP: 15.0000 mL | ORAL | Status: DC | PRN

## 2014-08-26 MED ORDER — PANTOPRAZOLE SODIUM 40 MG PO TBEC
40.0000 mg | DELAYED_RELEASE_TABLET | Freq: Every day | ORAL | Status: DC
  Administered 2014-08-26 – 2014-08-28 (×3): 40 mg via ORAL
  Filled 2014-08-26 (×3): qty 1

## 2014-08-26 MED ORDER — ONE-DAILY MULTI VITAMINS PO TABS: 1.0000 | ORAL_TABLET | Freq: Every day | ORAL | Status: DC

## 2014-08-26 MED ORDER — SODIUM CHLORIDE 0.9 % IV SOLN
INTRAVENOUS | Status: DC
  Administered 2014-08-26: 19:00:00 via INTRAVENOUS

## 2014-08-26 MED ORDER — ENOXAPARIN SODIUM 30 MG/0.3ML ~~LOC~~ SOLN
30.0000 mg | SUBCUTANEOUS | Status: DC
  Administered 2014-08-27: 30 mg via SUBCUTANEOUS
  Filled 2014-08-26 (×2): qty 0.3

## 2014-08-26 MED ORDER — CHLORHEXIDINE GLUCONATE CLOTH 2 % EX PADS: 6.0000 | MEDICATED_PAD | Freq: Once | CUTANEOUS | Status: DC

## 2014-08-26 MED ORDER — OXYCODONE-ACETAMINOPHEN 5-325 MG PO TABS
1.0000 | ORAL_TABLET | Freq: Three times a day (TID) | ORAL | Status: DC | PRN
  Administered 2014-08-26 – 2014-08-28 (×4): 1 via ORAL
  Filled 2014-08-26 (×4): qty 1

## 2014-08-26 MED ORDER — VITAMIN D (ERGOCALCIFEROL) 1.25 MG (50000 UNIT) PO CAPS: 50000.0000 [IU] | ORAL_CAPSULE | ORAL | Status: DC

## 2014-08-26 MED ORDER — ALBUTEROL SULFATE (2.5 MG/3ML) 0.083% IN NEBU: 3.0000 mL | INHALATION_SOLUTION | Freq: Four times a day (QID) | RESPIRATORY_TRACT | Status: DC | PRN

## 2014-08-26 MED ORDER — MORPHINE SULFATE 2 MG/ML IJ SOLN
1.0000 mg | INTRAMUSCULAR | Status: DC | PRN
  Administered 2014-08-26 – 2014-08-28 (×10): 2 mg via INTRAVENOUS
  Filled 2014-08-26 (×10): qty 1

## 2014-08-26 MED ORDER — LIDOCAINE HCL (CARDIAC) 20 MG/ML IV SOLN
INTRAVENOUS | Status: DC | PRN
  Administered 2014-08-26: 50 mg via INTRAVENOUS

## 2014-08-26 MED ORDER — HEPARIN SODIUM (PORCINE) 1000 UNIT/ML IJ SOLN
INTRAMUSCULAR | Status: DC | PRN
  Administered 2014-08-26: 4000 [IU] via INTRAVENOUS
  Administered 2014-08-26: 2000 [IU] via INTRAVENOUS

## 2014-08-26 MED ORDER — MIDAZOLAM HCL 5 MG/5ML IJ SOLN
INTRAMUSCULAR | Status: DC | PRN
  Administered 2014-08-26: 2 mg via INTRAVENOUS

## 2014-08-26 MED ORDER — ONDANSETRON HCL 4 MG/2ML IJ SOLN
4.0000 mg | Freq: Four times a day (QID) | INTRAMUSCULAR | Status: DC | PRN
Start: 2014-08-26 — End: 2014-08-28
  Administered 2014-08-27: 4 mg via INTRAVENOUS
  Filled 2014-08-26: qty 2

## 2014-08-26 MED ORDER — SUCRALFATE 1 G PO TABS: 1.0000 g | ORAL_TABLET | Freq: Three times a day (TID) | ORAL | Status: DC

## 2014-08-26 MED ORDER — IODIXANOL 320 MG/ML IV SOLN
INTRAVENOUS | Status: DC | PRN
  Administered 2014-08-26: 5 mL via INTRAVENOUS
  Administered 2014-08-26: 100 mL via INTRAVENOUS

## 2014-08-26 MED ORDER — GUAIFENESIN-DM 100-10 MG/5ML PO SYRP: 15.0000 mL | ORAL_SOLUTION | ORAL | Status: DC | PRN

## 2014-08-26 MED ORDER — SODIUM CHLORIDE 0.9 % IR SOLN
Status: DC | PRN
  Administered 2014-08-26: 13:00:00
  Administered 2014-08-26: 500 mL

## 2014-08-26 MED ORDER — ROCURONIUM BROMIDE 100 MG/10ML IV SOLN
INTRAVENOUS | Status: DC | PRN
  Administered 2014-08-26 (×2): 10 mg via INTRAVENOUS
  Administered 2014-08-26: 35 mg via INTRAVENOUS
  Administered 2014-08-26: 15 mg via INTRAVENOUS
  Administered 2014-08-26: 10 mg via INTRAVENOUS

## 2014-08-26 MED ORDER — ONDANSETRON HCL 4 MG/2ML IJ SOLN
INTRAMUSCULAR | Status: AC
Start: 2014-08-26 — End: 2014-08-26
  Filled 2014-08-26: qty 2

## 2014-08-26 MED ORDER — METOPROLOL TARTRATE 1 MG/ML IV SOLN
2.0000 mg | INTRAVENOUS | Status: DC | PRN
  Filled 2014-08-26: qty 5

## 2014-08-26 MED ORDER — NEOSTIGMINE METHYLSULFATE 10 MG/10ML IV SOLN
INTRAVENOUS | Status: DC | PRN
  Administered 2014-08-26: 2 mg via INTRAVENOUS

## 2014-08-26 MED ORDER — FENTANYL CITRATE (PF) 250 MCG/5ML IJ SOLN
INTRAMUSCULAR | Status: AC
  Filled 2014-08-26: qty 5

## 2014-08-26 MED ORDER — MIDAZOLAM HCL 2 MG/2ML IJ SOLN
INTRAMUSCULAR | Status: AC
  Administered 2014-08-26: 1 mg
  Filled 2014-08-26: qty 2

## 2014-08-26 MED ORDER — PHENOL 1.4 % MT LIQD: 1.0000 | OROMUCOSAL | Status: DC | PRN

## 2014-08-26 MED ORDER — ROCURONIUM BROMIDE 50 MG/5ML IV SOLN
INTRAVENOUS | Status: AC
  Filled 2014-08-26: qty 1

## 2014-08-26 MED ORDER — POTASSIUM CHLORIDE CRYS ER 20 MEQ PO TBCR
20.0000 meq | EXTENDED_RELEASE_TABLET | Freq: Every day | ORAL | Status: AC | PRN
  Administered 2014-08-27: 40 meq via ORAL
  Filled 2014-08-26: qty 2

## 2014-08-26 MED ORDER — GLYCOPYRROLATE 0.2 MG/ML IJ SOLN
INTRAMUSCULAR | Status: DC | PRN
Start: 2014-08-26 — End: 2014-08-26
  Administered 2014-08-26: 0.2 mg via INTRAVENOUS

## 2014-08-26 MED ORDER — PROPOFOL 10 MG/ML IV BOLUS
INTRAVENOUS | Status: AC
  Filled 2014-08-26: qty 20

## 2014-08-26 MED ORDER — DICYCLOMINE HCL 20 MG PO TABS
20.0000 mg | ORAL_TABLET | Freq: Three times a day (TID) | ORAL | Status: DC
  Administered 2014-08-26 – 2014-08-28 (×6): 20 mg via ORAL
  Filled 2014-08-26 (×10): qty 1

## 2014-08-26 MED ORDER — CEFUROXIME SODIUM 1.5 G IJ SOLR
1.5000 g | Freq: Two times a day (BID) | INTRAMUSCULAR | Status: AC
  Administered 2014-08-26 – 2014-08-27 (×2): 1.5 g via INTRAVENOUS
  Filled 2014-08-26 (×2): qty 1.5

## 2014-08-26 MED ORDER — PROTAMINE SULFATE 10 MG/ML IV SOLN
INTRAVENOUS | Status: DC | PRN
  Administered 2014-08-26: 30 mg via INTRAVENOUS

## 2014-08-26 MED ORDER — FLUOCINONIDE 0.05 % EX CREA: 1.0000 "application " | TOPICAL_CREAM | Freq: Two times a day (BID) | CUTANEOUS | Status: DC | PRN

## 2014-08-26 MED ORDER — ADULT MULTIVITAMIN W/MINERALS CH
1.0000 | ORAL_TABLET | Freq: Every day | ORAL | Status: DC
  Administered 2014-08-26 – 2014-08-28 (×3): 1 via ORAL
  Filled 2014-08-26 (×3): qty 1

## 2014-08-26 MED ORDER — 0.9 % SODIUM CHLORIDE (POUR BTL) OPTIME
TOPICAL | Status: DC | PRN
  Administered 2014-08-26: 1000 mL

## 2014-08-26 MED ORDER — ACETAMINOPHEN 325 MG PO TABS: 325.0000 mg | ORAL_TABLET | ORAL | Status: DC | PRN

## 2014-08-26 MED ORDER — SODIUM CHLORIDE 0.9 % IV SOLN: 500.0000 mL | Freq: Once | INTRAVENOUS | Status: AC | PRN

## 2014-08-26 MED ORDER — HYDRALAZINE HCL 20 MG/ML IJ SOLN
INTRAMUSCULAR | Status: AC
  Filled 2014-08-26: qty 1

## 2014-08-26 MED ORDER — DOCUSATE SODIUM 100 MG PO CAPS
100.0000 mg | ORAL_CAPSULE | Freq: Every day | ORAL | Status: DC
  Administered 2014-08-27 – 2014-08-28 (×2): 100 mg via ORAL
  Filled 2014-08-26 (×3): qty 1

## 2014-08-26 MED ORDER — PROTAMINE SULFATE 10 MG/ML IV SOLN
INTRAVENOUS | Status: AC
  Filled 2014-08-26: qty 5

## 2014-08-26 MED ORDER — LIDOCAINE HCL (CARDIAC) 20 MG/ML IV SOLN
INTRAVENOUS | Status: AC
  Filled 2014-08-26: qty 5

## 2014-08-26 MED ORDER — HYDROMORPHONE HCL 1 MG/ML IJ SOLN
INTRAMUSCULAR | Status: AC
  Filled 2014-08-26: qty 1

## 2014-08-26 MED ORDER — FENTANYL CITRATE (PF) 100 MCG/2ML IJ SOLN: 25.0000 ug | INTRAMUSCULAR | Status: DC | PRN

## 2014-08-26 MED ORDER — MIDAZOLAM HCL 2 MG/2ML IJ SOLN
INTRAMUSCULAR | Status: AC
  Filled 2014-08-26: qty 2

## 2014-08-26 MED ORDER — HEPARIN SODIUM (PORCINE) 1000 UNIT/ML IJ SOLN
INTRAMUSCULAR | Status: AC
  Filled 2014-08-26: qty 2

## 2014-08-26 SURGICAL SUPPLY — 80 items
BALLN ATLAS 14X40X75 (BALLOONS) ×3
BALLN MUSTANG 12.0X40 75 (BALLOONS) ×2
BALLN MUSTANG 12.0X40 75CM (BALLOONS) ×1
BALLOON ATLAS 14X40X75 (BALLOONS) ×1 IMPLANT
BALLOON MUSTANG 12.0X40 75 (BALLOONS) ×1 IMPLANT
CANISTER SUCTION 2500CC (MISCELLANEOUS) ×3 IMPLANT
CATH ANGIO 5F BER2 100CM (CATHETERS) ×3 IMPLANT
CATH BEACON 5 .035 65 VANSC3 (CATHETERS) ×3 IMPLANT
CATH BEACON 5.038 65CM KMP-01 (CATHETERS) IMPLANT
CATH CROSS OVER TEMPO 5F (CATHETERS) ×3 IMPLANT
CATH FOLEY LATEX FREE 16FR (CATHETERS) ×2
CATH FOLEY LF 16FR (CATHETERS) ×1 IMPLANT
CATH OMNI FLUSH .035X70CM (CATHETERS) ×3 IMPLANT
CATH SOFT-VU 4F 65 STRAIGHT (CATHETERS) ×1 IMPLANT
CATH SOFT-VU STRAIGHT 4F 65CM (CATHETERS) ×2
CATH VISIONS PV .035 IVUS (CATHETERS) ×3 IMPLANT
CLIP TI MEDIUM 24 (CLIP) ×3 IMPLANT
CLIP TI WIDE RED SMALL 24 (CLIP) ×3 IMPLANT
COVER PROBE W GEL 5X96 (DRAPES) ×6 IMPLANT
DEVICE CLOSURE PERCLS PRGLD 6F (VASCULAR PRODUCTS) IMPLANT
DEVICE TORQUE KENDALL .025-038 (MISCELLANEOUS) ×3 IMPLANT
DRSG TEGADERM 2-3/8X2-3/4 SM (GAUZE/BANDAGES/DRESSINGS) ×3 IMPLANT
DRYSEAL FLEXSHEATH 12FR 33CM (SHEATH) ×2
ELECT CAUTERY BLADE 6.4 (BLADE) ×3 IMPLANT
ELECT REM PT RETURN 9FT ADLT (ELECTROSURGICAL) ×6
ELECTRODE REM PT RTRN 9FT ADLT (ELECTROSURGICAL) ×2 IMPLANT
GAUZE SPONGE 2X2 8PLY STRL LF (GAUZE/BANDAGES/DRESSINGS) ×1 IMPLANT
GLOVE BIO SURGEON STRL SZ7 (GLOVE) ×3 IMPLANT
GLOVE BIOGEL PI IND STRL 6.5 (GLOVE) ×4 IMPLANT
GLOVE BIOGEL PI IND STRL 7.5 (GLOVE) ×1 IMPLANT
GLOVE BIOGEL PI IND STRL 8 (GLOVE) ×1 IMPLANT
GLOVE BIOGEL PI INDICATOR 6.5 (GLOVE) ×8
GLOVE BIOGEL PI INDICATOR 7.5 (GLOVE) ×2
GLOVE BIOGEL PI INDICATOR 8 (GLOVE) ×2
GLOVE SURG SS PI 6.5 STRL IVOR (GLOVE) ×12 IMPLANT
GLOVE SURG SS PI 7.5 STRL IVOR (GLOVE) ×6 IMPLANT
GOWN STRL REUS W/ TWL LRG LVL3 (GOWN DISPOSABLE) ×4 IMPLANT
GOWN STRL REUS W/TWL LRG LVL3 (GOWN DISPOSABLE) ×8
GRAFT BALLN CATH 65CM (STENTS) ×1 IMPLANT
GRAFT EXCLUDER ILIAC (Endovascular Graft) ×3 IMPLANT
GUIDEWIRE ANGLED .035X150CM (WIRE) ×3 IMPLANT
KIT BASIN OR (CUSTOM PROCEDURE TRAY) ×3 IMPLANT
KIT ENCORE 26 ADVANTAGE (KITS) ×3 IMPLANT
KIT ROOM TURNOVER OR (KITS) ×3 IMPLANT
LIQUID BAND (GAUZE/BANDAGES/DRESSINGS) ×3 IMPLANT
LOOP VESSEL MAXI BLUE (MISCELLANEOUS) ×3 IMPLANT
LOOP VESSEL MINI RED (MISCELLANEOUS) ×3 IMPLANT
NEEDLE PERC 18GX7CM (NEEDLE) ×3 IMPLANT
NS IRRIG 1000ML POUR BTL (IV SOLUTION) ×3 IMPLANT
PACK ENDOVASCULAR (PACKS) ×3 IMPLANT
PAD ARMBOARD 7.5X6 YLW CONV (MISCELLANEOUS) ×6 IMPLANT
PENCIL BUTTON HOLSTER BLD 10FT (ELECTRODE) ×3 IMPLANT
PERCLOSE PROGLIDE 6F (VASCULAR PRODUCTS)
SET MICROPUNCTURE 5F STIFF (MISCELLANEOUS) ×3 IMPLANT
SHEATH AVANTI 11CM 5FR (MISCELLANEOUS) ×3 IMPLANT
SHEATH AVANTI 11CM 8FR (MISCELLANEOUS) IMPLANT
SHEATH BRITE TIP 8FR 23CM (MISCELLANEOUS) IMPLANT
SHEATH DRYSEAL FLEX 12FR 33CM (SHEATH) ×1 IMPLANT
SPONGE GAUZE 2X2 STER 10/PKG (GAUZE/BANDAGES/DRESSINGS) ×2
STAPLER VISISTAT 35W (STAPLE) IMPLANT
STENT GENESIS OPTA 7X24X80 (Permanent Stent) ×3 IMPLANT
STENT GRAFT BALLN CATH 65CM (STENTS) ×2
STOPCOCK MORSE 400PSI 3WAY (MISCELLANEOUS) ×3 IMPLANT
SUT ETHILON 3 0 PS 1 (SUTURE) IMPLANT
SUT MNCRL AB 4-0 PS2 18 (SUTURE) ×6 IMPLANT
SUT PROLENE 5 0 C 1 24 (SUTURE) IMPLANT
SUT VIC AB 2-0 CT1 27 (SUTURE) ×2
SUT VIC AB 2-0 CT1 TAPERPNT 27 (SUTURE) ×1 IMPLANT
SUT VIC AB 2-0 CTX 36 (SUTURE) IMPLANT
SUT VIC AB 3-0 SH 27 (SUTURE) ×4
SUT VIC AB 3-0 SH 27X BRD (SUTURE) ×2 IMPLANT
SYR 30ML LL (SYRINGE) ×3 IMPLANT
TRAY FOLEY CATH 16FRSI W/METER (SET/KITS/TRAYS/PACK) ×3 IMPLANT
TUBE CONNECTING 12'X1/4 (SUCTIONS) ×1
TUBE CONNECTING 12X1/4 (SUCTIONS) ×2 IMPLANT
TUBING HIGH PRESSURE 120CM (CONNECTOR) ×3 IMPLANT
WIRE AMPLATZ SS-J .035X180CM (WIRE) ×3 IMPLANT
WIRE BENTSON .035X145CM (WIRE) ×3 IMPLANT
WIRE ROSEN-J .035X260CM (WIRE) ×3 IMPLANT
YANKAUER SUCT BULB TIP NO VENT (SUCTIONS) ×3 IMPLANT

## 2014-08-26 NOTE — Interval H&P Note (Signed)
[facility] [place]  History and Physical Update  The patient was interviewed and re-examined.  The patient's previous History and Physical has been reviewed and is unchanged from my NP's consult except for: interval angiogram.  Based on that angiogram, I felt aortic stenting was going to be necessary with possible need for right iliofemoral endarterectomy and bovine patch angioplasty.  Risks include but are not limited to: bleeding, infection, rupture of aorta requiring immediate open repair, possible bowel ischemia due to coverage of Inferior mesenteric artery, possible iliac artery injury requiring immediate intervention, stroke, myocardial infarction and death.  Adele Barthel, MD [facility] [place] Office: [phone] Pager: [phone]  08/26/2014, 9:45 AM

## 2014-08-26 NOTE — Progress Notes (Signed)
  Day of Surgery Note    Subjective:  C/o of lower back pain  Filed Vitals:   08/26/14 1603  BP: 193/64  Pulse:   Temp:   Resp:     Incisions:   Right groin is c/d/i soft without hematoma; left groin with small hematoma Extremities:  Audible doppler signals bilateral DP; faint right PT signal  Cardiac:  regular Lungs:  Non labored Abdomen:  Soft, non tender, non distended  Assessment/Plan:  This is a 69 y.o. female who is s/p aortic stent graft placement  -pt hypertensive this afternoon with systolic in 117'B.  Bilateral cuff pressures are 567'O systolic.  Pt BP was 141 systolic on arrival to hospital this am.  Will go by cuff pressures for BP management.   -Will start NTG gtt and titrate for systolic < 030 if her cuff pressures increase.   -left groin with small hematoma-will apply pressure dressing -abdomen is soft and non distended -transfer to [place] later this afternoon.   Leontine Locket, PA-C 08/26/2014 4:06 PM

## 2014-08-26 NOTE — H&P (View-Only) (Signed)
[facility] [place] HISTORY AND PHYSICAL -PAD  History of Present Illness Cynthia Bowen is a 69 y.o. female patient of Dr. Bridgett Larsson who returns for 3 months evaluation of BLE intermittent claudication. She was seen by Dr. Bridgett Larsson in September 2015 for initial evaluation.  She returns today for follow up. She was faithful in her graduated walking program until about 3 weeks ago she developed a tight feeling in her waist to her legs and toes of both LE's, this is gradually worsening; this is at rest; if she tries to walk the tightness and pain are worse. Also about 3 weeks ago her left heel became red/cyanotic, small laceration on left heel is not healing.   Onset of symptom occurred after her abdominal surgery. At her September 2015 visit her pain was described as cramping, severity 3-5/10, and associated with walking >50 feet. Patient attempted to treat this pain with rest. Atherosclerotic risk factors include: former smoker, quit in 2006.   The patient is currently not on a statin as not indicated.  The patient is currently not on an anti-platelet: due to bleeding risk with recent gastric perforation.  Pt denies any history of stroke or TIA, denies any cardiac problems. Pt states she has a big appetite, she is not able to gain weight, is not losing weight. She denies post prandial abdominal pain but is unable to eat much since so much of her stomach was removed.  The patient reports New Medical or Surgical History: was hospitalized at [facility] overnight for evaluation of swollen right hand; pt does not know if any etiology was found for this, swelling resolved with medication including prednisone; she has known arthritis.   Pt Diabetic: No Former smoker, quit in 2006, started in her teens.   Past Medical History  Diagnosis Date  . COPD (chronic obstructive pulmonary disease)   . Pneumonia 12-2011  . GERD (gastroesophageal reflux disease)   . Headache(784.0)   .  Arthritis     osteoarthritis  . Allergy   . Depression   . Neuromuscular disorder   . Osteoporosis   . Bronchitis     CURRENTLY AS OF 06/30/12 - HAS COUGH AND FINISHED ANTIBIOTIC FOR BRONCHITIS  . Fibromyalgia   . Pain     ABDOMINAL PAIN AND NAUSEA  . Pain     SOMETIMES PAIN RIGHT EAR AND NECK--STATES CAUSED BY A "LUMP" ON BACK OF EAR--USES KENALOG CREAM TOPICALLY AS NEEDED.  Marland Kitchen Gastrocutaneous fistula   . Anemia   . Anxiety   . Blood transfusion without reported diagnosis     Social History History  Substance Use Topics  . Smoking status: Former Smoker -- 0.50 packs/day for 35 years    Types: Cigarettes    Start date: 01/04/2003    Quit date: 04/17/2005  . Smokeless tobacco: Never Used  . Alcohol Use: No    Family History Family History  Problem Relation Age of Onset  . COPD Mother   . Hyperlipidemia Mother   . Hypertension Maternal Grandmother   . Stroke Maternal Grandmother   . Colon cancer Neg Hx     Past Surgical History  Procedure Laterality Date  . Abdominal hysterectomy    . Esophagogastroduodenoscopy  04/18/2012    Procedure: ESOPHAGOGASTRODUODENOSCOPY (EGD);  Surgeon: Inda Castle, MD;  Location: [facility];  Service: Endoscopy;  Laterality: N/A;  . Eus  05/29/2012    Procedure: UPPER ENDOSCOPIC ULTRASOUND (EUS) LINEAR;  Surgeon: Milus Banister, MD;  Location: [facility];  Service: Endoscopy;  Laterality: N/A;  . Appendectomy    . Spine surgery      CERVICAL SPINE SURGERY X 2 - INCLUDING FUSION; LUMBAR SURGERY FOR RUPTURED DISC  . Eye surgery      BILATERAL CATARACT EXTRACTIONS  . Laparoscopic abdominal exploration N/A 07/02/2012    Procedure: converted to laparotomy with gastric biopsy;  Surgeon: Imogene Burn. Georgette Dover, MD;  Location: [facility];  Service: General;  Laterality: N/A;  Laparoscopic Gastric Biospy attempted.   . Laparotomy N/A 07/07/2012    Procedure: EXPLORATORY LAPAROTOMY repair of gastric perforation with omental graham patch, drainage of  abdominal abcess;  Surgeon: Imogene Burn. Georgette Dover, MD;  Location: [facility];  Service: General;  Laterality: N/A;  . Stomach surgery  07/07/2012    Omental patch of gastric perforation  . Laparotomy N/A 07/13/2012    Procedure: EXPLORATORY LAPAROTOMY repair gastric leak;  Surgeon: Edward Jolly, MD;  Location: [facility];  Service: General;  Laterality: N/A;  . Laparotomy N/A 11/11/2012    Procedure: EXPLORATORY LAPAROTOMY;  Surgeon: Imogene Burn. Georgette Dover, MD;  Location: [place];  Service: General;  Laterality: N/A;  . Bowel resection N/A 11/11/2012    Procedure: SMALL BOWEL RESECTION;  Surgeon: Imogene Burn. Georgette Dover, MD;  Location: [place];  Service: General;  Laterality: N/A;  . Minor application of wound vac N/A 11/11/2012    Procedure: APPLICATION OF WOUND VAC;  Surgeon: Imogene Burn. Georgette Dover, MD;  Location: [place];  Service: General;  Laterality: N/A;  . Jejunostomy N/A 11/11/2012    Procedure: PLACEMENT OF FEEDING JEJUNOSTOMY TUBE;  Surgeon: Imogene Burn. Georgette Dover, MD;  Location: [place];  Service: General;  Laterality: N/A;  . Lysis of adhesion N/A 11/11/2012    Procedure: LYSIS OF ADHESION;  Surgeon: Imogene Burn. Georgette Dover, MD;  Location: [place];  Service: General;  Laterality: N/A;  . Hernia repair    . Gastric fistula repair      05/16/2013  . Colonoscopy    . Polypectomy    . Upper gastrointestinal endoscopy      Allergies  Allergen Reactions  . Avelox [Moxifloxacin Hcl In Nacl] Nausea And Vomiting  . Betadine [Povidone Iodine] Itching and Rash  . Alendronate Sodium Nausea And Vomiting  . Alendronate Sodium     Other reaction(s): Dizziness (intolerance)  . Aspirin Nausea Only  . Codeine Nausea And Vomiting  . Doxycycline     Pt doesn't remember reaction  . Fluconazole     Pt doesn't remember reaction  . Hydrocodone Nausea And Vomiting    GI distress  . Hydrocodone-Acetaminophen Nausea And Vomiting  . Latex   . Neurontin [Gabapentin] Other (See Comments)    Mood changes   . Nsaids Other (See Comments)    Severe  gastritis & perforation - avoid NSAIDs when possible  . Quinolones Itching and Hives  . Sertraline Hcl Other (See Comments)    Hallucinations   . Sertraline Hcl Nausea And Vomiting  . Sulfamethoxazole Itching and Hives  . Sulfa Antibiotics Rash    Current Outpatient Prescriptions  Medication Sig Dispense Refill  . clidinium-chlordiazePOXIDE (LIBRAX) 5-2.5 MG per capsule TAKE ONE CAPSULE BY MOUTH 4 TIMES A DAY BEFORE MEALS AND AT BEDTIME, AS NEEDED FOR ABDOMINAL PAIN 120 capsule 3  . dicyclomine (BENTYL) 20 MG tablet Take 1 tablet (20 mg total) by mouth 4 (four) times daily -  before meals and at bedtime. 60 tablet 5  . fluocinonide cream (LIDEX) 6.30 % Apply 1 application topically 2 (  two) times daily as needed (rash).    . ondansetron (ZOFRAN) 4 MG tablet TAKE 1 TABLET (4 MG TOTAL) BY MOUTH EVERY 8 (EIGHT) HOURS AS NEEDED FOR NAUSEA OR VOMITING. 60 tablet 1  . oxyCODONE-acetaminophen (ROXICET) 5-325 MG per tablet Take 1 tablet by mouth every 8 (eight) hours as needed for severe pain. 30 tablet 0  . polyethylene glycol powder (GLYCOLAX/MIRALAX) powder Take 17 g by mouth daily. 1700 g 1  . albuterol (PROVENTIL HFA;VENTOLIN HFA) 108 (90 BASE) MCG/ACT inhaler Inhale 2 puffs into the lungs every 6 (six) hours as needed for wheezing. (Patient not taking: Reported on 06/25/2014) 1 Inhaler 2  . CALCIUM PO Take 1 tablet by mouth daily.    . diphenhydrAMINE (BENADRYL) 12.5 MG/5ML liquid Take 12.5 mg by mouth 4 (four) times daily as needed for itching.     Marland Kitchen EPINEPHrine (EPIPEN) 0.3 mg/0.3 mL IJ SOAJ injection Inject 0.3 mLs (0.3 mg total) into the muscle daily as needed (anaphylaxis). (Patient not taking: Reported on 08/13/2014) 1 Device 3  . feeding supplement, RESOURCE BREEZE, (RESOURCE BREEZE) LIQD Take 1 Container by mouth 3 (three) times daily between meals. (Patient not taking: Reported on 06/25/2014)    . fluconazole (DIFLUCAN) 150 MG tablet Take 1 tablet (150 mg total) by mouth once. (Patient not  taking: Reported on 06/25/2014) 1 tablet 0  . ketoconazole (NIZORAL) 2 % shampoo Apply 1 application topically 2 (two) times a week. (Patient not taking: Reported on 06/25/2014) 120 mL 0  . metroNIDAZOLE (FLAGYL) 500 MG tablet Take 1 tablet (500 mg total) by mouth every 8 (eight) hours. (Patient not taking: Reported on 06/25/2014) 36 tablet 0  . Multiple Vitamin (MULTIVITAMIN) tablet Take 1 tablet by mouth daily.    Marland Kitchen omeprazole (PRILOSEC) 40 MG capsule Take 1 capsule (40 mg total) by mouth daily. (Patient not taking: Reported on 06/25/2014) 15 capsule 0  . potassium chloride SA (K-DUR,KLOR-CON) 20 MEQ tablet Take 1 tablet BID for 2 days, Take 1 tablet once a day for 4 days (Patient not taking: Reported on 06/25/2014) 15 tablet 0  . predniSONE (DELTASONE) 10 MG tablet Take 3 tabs daily 3 days, then 2 tabs daily 3 days, then 1 tab daily 3 days, then stop. (Patient not taking: Reported on 06/25/2014) 18 tablet 0  . Probiotic Product (PROBIOTIC DAILY PO) Take by mouth.    . ranitidine (ZANTAC) 150 MG tablet Take 1 tablet (150 mg total) by mouth 2 (two) times daily. (Patient not taking: Reported on 08/13/2014) 180 tablet 1  . saccharomyces boulardii (FLORASTOR) 250 MG capsule Take 1 capsule (250 mg total) by mouth 2 (two) times daily. (Patient not taking: Reported on 06/25/2014) 60 capsule 0  . Simethicone (GAS-X EXTRA STRENGTH PO) Take by mouth.    . sucralfate (CARAFATE) 1 G tablet Take 1 tablet (1 g total) by mouth 4 (four) times daily -  with meals and at bedtime. (Patient not taking: Reported on 06/25/2014) 20 tablet 0  . sucralfate (CARAFATE) 1 G tablet Take 1 tablet (1 g total) by mouth 4 (four) times daily -  with meals and at bedtime. (Patient not taking: Reported on 07/16/2014) 28 tablet 0  . Vitamin D, Ergocalciferol, (DRISDOL) 50000 UNITS CAPS capsule Take 1 capsule (50,000 Units total) by mouth every 7 (seven) days. (Patient not taking: Reported on 08/13/2014) 12 capsule 3   No current facility-administered  medications for this visit.    ROS: See HPI for pertinent positives and negatives.   Physical  Examination  Filed Vitals:   08/13/14 1407  BP: 95/61  Pulse: 86  Temp: 98.6 F (37 C)  TempSrc: Oral  Resp: 14  Height: 5\' 5"  (1.651 m)  Weight: 80 lb (36.288 kg)  SpO2: 98%   Body mass index is 13.31 kg/(m^2).  General: A&O x 3, WD, Cachectic, voice is husky  Eyes: PERRLA  Pulmonary: Sym exp, good air movt, CTAB, no rales, rhonchi, & wheezing  Cardiac: RRR, Nl S1, S2, no detected murmur  Vascular: Vessel Right Left  Radial 2+Palpable Not Palpable  Brachial 2+Palpable 2+Palpable  Carotid Palpable, without bruit Palpable, without bruit  Aorta Not palpable N/A  Femoral Not Palpable not Palpable  Popliteal Palpable Not palpable  PT Not Palpable Not Palpable  DP not Palpable Not Palpable   Gastrointestinal: soft, NTND, -G/R, - HSM, - palpable masses, - CVAT B.  Musculoskeletal: M/S 5/5 throughout, left heel is red/slightly cyanotic, left toes are dusky. Shallow small laceration on left heel, no signs of infection.  Neurologic: CN 2-12 intact , Painful to light touch in left lower leg and foot , Motor exam as listed above  Psychiatric: Judgment intact, Mood & affect appropriatefor pt's clinical situation         Non-Invasive Vascular Imaging: DATE: 08/13/2014 ABI: RIGHT: 0.29 (04/30/14, 0.62), Waveforms: none detected and monophasic, TBI: 0.25;  LEFT: none (0.67), Waveforms: not detected. Bilateral common femoral arteries are monophasic.   ASSESSMENT: ZONYA GUDGER is a 69 y.o. female who presents with worsening claudication, rest pain from waist to both legs and feet. No Doppler detected pulses in left leg. No Doppler detected pulses in right leg except for monophasic anterior tibial. Significant deterioration in perfusion of both lower extremities in the last three months, critical bilateral lower extremity  ischemia.  PLAN:  I discussed in depth with the patient the nature of atherosclerosis, and emphasized the importance of maximal medical management including strict control of blood pressure, blood glucose, and lipid levels, obtaining regular exercise, and continued cessation of smoking.  The patient is aware that without maximal medical management the underlying atherosclerotic disease process will progress, limiting the benefit of any interventions.  Based on the patient's vascular studies and examination, pt will be scheduled for arteriogram on Monday April 25 with Dr. Bridgett Larsson, possible intervention  The patient was given information about PAD including signs, symptoms, treatment, what symptoms should prompt the patient to seek immediate medical care, and risk reduction measures to take.  Clemon Chambers, RN, MSN, FNP-C [facility] Phone: [phone]  Clinic MD: Scot Dock  08/13/2014 2:24 PM

## 2014-08-26 NOTE — Anesthesia Postprocedure Evaluation (Signed)
  Anesthesia Post-op Note  Patient: Cynthia Bowen  Procedure(s) Performed: Procedure(s) with comments: AORTIC ENDOVASCULAR  COVERED STENT  (N/A) - IVUS INTRAVASCULAR ULTRASOUND (N/A) - IVUS  Patient Location: PACU  Anesthesia Type:General  Level of Consciousness: awake, alert  and oriented  Airway and Oxygen Therapy: Patient Spontanous Breathing and Patient connected to nasal cannula oxygen  Post-op Pain: mild  Post-op Assessment: Post-op Vital signs reviewed, Patient's Cardiovascular Status Stable, Respiratory Function Stable, Patent Airway and Pain level controlled  Post-op Vital Signs: stable  Last Vitals:  Filed Vitals:   08/26/14 1830  BP:   Pulse: 78  Temp:   Resp: 14    Complications: No apparent anesthesia complications

## 2014-08-26 NOTE — Op Note (Signed)
OPERATIVE NOTE   PROCEDURE: 1. Right femoral artery exposure and repair 2. Open cannulation of right common femoral artery 3. Placement of catheter in aorta x 2 4. Cannulation of left common femoral artery under ultrasound guidance 5. Intravascular ultrasound 6. Endovascular repair of aorta using aorto-aortic tube prosthesis (16 mm x 14 mm x 7 cm) 7. Stenting and angioplasty left common iliac artery (balloon expandable uncovered stent 7 mm x 38 mm)  PRE-OPERATIVE DIAGNOSIS: bilateral critical limb ischemia , severe aortic stenosis   POST-OPERATIVE DIAGNOSIS: same as above   SURGEON: Adele Barthel, MD  ASSISTANT(S): Dr. Gae Gallop; Leontine Locket, [facility]   ANESTHESIA: general  ESTIMATED BLOOD LOSS: 100 cc  FINDING(S): 1.  Pre-intervention on IVUS: proximal infrarenal aorta ~14 mm, distal aorta ~13 mm, maximum stenotic segment: ~2-3 mm with calcific eccentric plaque 2.  Post-intervention on IVUS: ~10 mm lumen at segment of maximum stenosis 3.  Post-stent graft deployment encroachment on left common iliac artery lumen: resolved with stent placement in left common iliac artery  4.  Palpable femoral pulses at the end of the case: previously non-palpable  SPECIMEN(S):  none  INDICATIONS:   Cynthia Bowen is a 69 y.o. female who presents with critical limb ischemia with essentially flat-line waveforms in both feet.  I brought the patient back to the angiography suite for diagnostic study, which demonstrated near occlusion of aortic from a mid-segment stenosis and bilateral superficial femoral artery occlusion with essentially only reconstituted anterior tibial artery flow to both feet.  I felt that this patient would require an attempt at endovascular stenting of the aorta to avoid infrarenal aortic thrombosis.  The patient is aware of the risk, benefits, and alternatives in this patient.  She elects to proceed.   DESCRIPTION: After obtaining full informed written consent, the  patient was brought back to the operating room and placed supine upon the operating table.  The patient received IV antibiotics prior to induction.  After obtaining adequate anesthesia, the patient was prepped and draped in the standard fashion for: endovascular aortic repair.  I turned my attention to the right groin.  I made an incision over the right common femoral artery.  I dissected out the right common femoral artery with electrocautery.  I felt the artery was soft enough to cannulated.  Note, there was no palpable pulse in this femoral artery.  I cannulated the right common femoral artery with a micropuncture needle.  I passed the microwire into the iliac arterial system under fluoroscopy.  I loaded the microsheath over the wire and the removed the dilator and wire.  I loaded a Benson wire into the aorta without any difficulties.  I then exchanged the right sheath for a 5-Fr sheath.  I then placed an end-hole catheter over the wire in the suprarenal segment.  I exchanged the wire for an Amplatz wire.  I The patient was given 4000 units of Heparin intravenously, which was a therapeutic bolus.  In total, 6000 units of Heparin was administrated to achieve and maintain a therapeutic level of anticoagulation.  removed the catheter and then exchanged the right femoral sheath for a 12-Fr Dryseal sheath.  This sheath surprisingly passed without any difficulty.  I then pulled the sheath back into the right common iliac artery.  I loaed the [facility] IVUS catheter over the wire and made measurements of the aorta from the infrarenal segment down to the distal aorta.  Based on the measurements, I selected a Gore iliac  limb (16 mm x 14 mm x 7 cm).  I loaded the iliac limb over the wire and positioned it with adequate overlap proximally and distally.  I deployed the graft.  Unfortunately, the left side strut appeared to tick down rather than up due likely to heavy calcification.  I obtained a Q45 balloon and  molded the proximal and distal segment of this graft before balloon the maximally stenotic segment.  I tried to push the distal extent of the graft more proximally with the Q45 balloon but the graft did not appear to displace off the left common iliac artery lumen.   I tried cross the distal bifurcation with a Vancheis catheter with a Glide wire but I could not cross the bifurcation with the catheter.  At this point, I elected to try to cross the aortic bifurcation from the left groin.  Under ultrasound guidance, I cannulated the left common femoral artery with a micropuncture needle.  I passed the microwire into left iliac system then exchanged the needle for the microsheath.  I placed a Benson wire through the sheath into the left common iliac artery.  The sheath was exchanged for a 6-Fr sheath.  The wire would not track into the aorta, suggesting some degree of occlusion of the left common iliac artery lumen.  I tried multiple angles to image the flow channel in this left common iliac artery.  On all injections, there appeared to be intact flow into the left iliac arterial system.  Eventually, I was able to find the flow channel posteriorly tracking in the right iliac arterial system.  Unfortunately, again the catheter would not track.  I switched sides again to the right.  This time with a Crosser catheter and glidewire, I was able to cross the bifurcation and maneuver the wire into the left external iliac artery.  I was able to track the catheter into the left external iliac artery.  Based on measurements, I selected a 7 mm x 38 mm uncovered balloon expandable stent.  I deployed this stent partial extending into the distal aorta.  I was able to nudge the stent graft proximally and secure a 7 mm flow channel to the left common iliac artery.  I removed the stent delivery device and removed the wire from the left iliac arterial system.  The proximal left common iliac artery stent did not flop into the aortic  stent graft, rather it appeared to maintain the distal bifurcation.  I reloaded a wire in the aorta and then reloaded the IVUS.  It demonstrate good wall apposition throughout but still significant lumen loss at the maximal stenotic segment.  I loaded an Atlas 14 mm x 40 mm balloon and started to inflated the balloon, but its shoulders were so long, I felt it might nudge the left common iliac artery stent out of place, so I deflated it and removed it.  I then replaced this balloon with 12 mm x 40 mm Mustang balloon.  I center this on the area of maximal stenosis and inflated it to 10 atm for 2 minutes.  I deflated the balloon and removed it.  The IVUS was reloaded.  This demonstrated further deployment of the stent graft at the area of maximal stenosis, now with a 10 mm lumen evident.  I exchanged the IVUS catheter for the pigtail and completed one last completion angiogram.  I saw no evident of frank extravasation and no evidence of frank endoleak.  Both iliac limbs were widely patent.  At this point, the [facility] wire was replaced in the pigtail catheter and the crook straightened.  I removed the wire and catheter together.  At this point, I clamped the left common femoral artery proximal and distally.  The proximal clamp was applied just as the sheath was removed.  Due to calcification there was some limited bleeding but I was able to repair this right common femoral artery with a running stitch of 5-0 Prolene.  I gave the patient 30 mg of Protamine to reverse anticoagulation.  There was a palpable pulse in both groins.  Previously there was no pulse in either groin.  At this point, I felt no further immediate intervention were necessary despite the prior angiogram.  I suspect some of the prior study reflects underfilling from the severe aortic stenosis.  Dr. Scot Dock pulled the left femoral sheath and held pressure for 15 minutes.  No immediate hematoma was evident.  The left groin was clean and sterile pressure  bandage applied.  I repaired the right groin with a double layer of 2-0 Vicryl, double layer of 3-0 Vicryl, and finally a running subcuticular in the skin with 4-0 Monocryl.  The right groin was cleaned, dried, reinforced with Dermabond.    At the end of the case, there were palpable bilateral femoral pulses which were not present previously.  COMPLICATIONS: none  CONDITION: stable   Adele Barthel, MD [facility] [place] Office: [phone] Pager: [phone]  08/26/2014, 3:36 PM

## 2014-08-26 NOTE — Transfer of Care (Signed)
Immediate Anesthesia Transfer of Care Note  Patient: Cynthia Bowen  Procedure(s) Performed: Procedure(s) with comments: AORTIC ENDOVASCULAR  COVERED STENT  (N/A) - IVUS INTRAVASCULAR ULTRASOUND (N/A) - IVUS  Patient Location: PACU  Anesthesia Type:General  Level of Consciousness: awake  Airway & Oxygen Therapy: Patient Spontanous Breathing and Patient connected to nasal cannula oxygen  Post-op Assessment: Report given to RN, Post -op Vital signs reviewed and stable and Patient moving all extremities  Post vital signs: Reviewed and stable  Last Vitals:  Filed Vitals:   08/26/14 1120  BP:   Pulse: 60  Temp:   Resp: 14    Complications: No apparent anesthesia complications

## 2014-08-26 NOTE — Progress Notes (Signed)
bp check in both arms per request Sam/PA/   Right 163/55 (adult cuff)  /  Left  158/57 (adult cuff) --->  Plan now to monitor pressures using cuuf, not aline. Pressure drsg to l groin by Vladimir Faster RN, minior doscomfort on behalf pt.

## 2014-08-26 NOTE — Anesthesia Preprocedure Evaluation (Signed)
Anesthesia Evaluation  Patient identified by MRN, date of birth, ID band Patient awake    Reviewed: Allergy & Precautions, NPO status , Patient's Chart, lab work & pertinent test results  Airway Mallampati: II  TM Distance: >3 FB Neck ROM: Full    Dental  (+) Edentulous Upper, Partial Lower, Dental Advisory Given   Pulmonary former smoker,  breath sounds clear to auscultation        Cardiovascular Rhythm:Regular Rate:Normal     Neuro/Psych    GI/Hepatic   Endo/Other    Renal/GU      Musculoskeletal   Abdominal   Peds  Hematology   Anesthesia Other Findings   Reproductive/Obstetrics                             Anesthesia Physical Anesthesia Plan  ASA: III  Anesthesia Plan: General   Post-op Pain Management:    Induction: Intravenous  Airway Management Planned: Oral ETT  Additional Equipment: Arterial line and CVP  Intra-op Plan:   Post-operative Plan: Extubation in OR  Informed Consent: I have reviewed the patients History and Physical, chart, labs and discussed the procedure including the risks, benefits and alternatives for the proposed anesthesia with the patient or authorized representative who has indicated his/her understanding and acceptance.   Dental advisory given  Plan Discussed with: CRNA and Anesthesiologist  Anesthesia Plan Comments: (AAA GERD Fibromyalgia Multiple allergies  Plan GA with oral ETT and CVP, art line  Roberts Gaudy)        Anesthesia Quick Evaluation

## 2014-08-26 NOTE — Anesthesia Procedure Notes (Signed)
Procedure Name: Intubation Performed by: Gean Maidens Pre-anesthesia Checklist: Patient identified, Emergency Drugs available, Suction available, Patient being monitored and Timeout performed Patient Re-evaluated:Patient Re-evaluated prior to inductionOxygen Delivery Method: Circle system utilized Preoxygenation: Pre-oxygenation with 100% oxygen Intubation Type: IV induction Ventilation: Mask ventilation without difficulty Laryngoscope Size: Mac and 3 Grade View: Grade I Tube type: Oral Tube size: 7.0 mm Number of attempts: 1 Placement Confirmation: ETT inserted through vocal cords under direct vision,  positive ETCO2,  CO2 detector and breath sounds checked- equal and bilateral Secured at: 21 cm Tube secured with: Tape Dental Injury: Teeth and Oropharynx as per pre-operative assessment

## 2014-08-26 NOTE — Progress Notes (Signed)
Sam/Vasc PA in to assess pulses with doppler and also noted small/soft  Hematoma r groin / aware sbp stil  190's by aline, after  15mg  hydralazine/ will give another 10 and she is on phone with Dr Bridgett Larsson re: how to progress with bp and make aware of findings

## 2014-08-26 NOTE — Interval H&P Note (Signed)
Pt elects to proceed.  Adele Barthel, MD [facility] [place] Office: [phone] Pager: [phone]  08/26/2014, 9:48 AM

## 2014-08-27 ENCOUNTER — Inpatient Hospital Stay (HOSPITAL_COMMUNITY): Payer: Medicare Other

## 2014-08-27 ENCOUNTER — Telehealth: Payer: Self-pay | Admitting: Vascular Surgery

## 2014-08-27 DIAGNOSIS — I70209 Unspecified atherosclerosis of native arteries of extremities, unspecified extremity: Secondary | ICD-10-CM

## 2014-08-27 LAB — CBC
HEMATOCRIT: 32.3 % — AB (ref 36.0–46.0)
HEMOGLOBIN: 11 g/dL — AB (ref 12.0–15.0)
MCH: 29.3 pg (ref 26.0–34.0)
MCHC: 34.1 g/dL (ref 30.0–36.0)
MCV: 85.9 fL (ref 78.0–100.0)
Platelets: 194 10*3/uL (ref 150–400)
RBC: 3.76 MIL/uL — ABNORMAL LOW (ref 3.87–5.11)
RDW: 17 % — ABNORMAL HIGH (ref 11.5–15.5)
WBC: 9.5 10*3/uL (ref 4.0–10.5)

## 2014-08-27 LAB — BASIC METABOLIC PANEL
Anion gap: 9 (ref 5–15)
BUN: 5 mg/dL — AB (ref 6–20)
CO2: 28 mmol/L (ref 22–32)
Calcium: 8.5 mg/dL — ABNORMAL LOW (ref 8.9–10.3)
Chloride: 103 mmol/L (ref 101–111)
Creatinine, Ser: 0.61 mg/dL (ref 0.44–1.00)
GFR calc Af Amer: >60 mL/min (ref >60)
GFR calc non Af Amer: >60 mL/min (ref >60)
GLUCOSE: 87 mg/dL (ref 70–99)
POTASSIUM: 3.2 mmol/L — AB (ref 3.5–5.1)
Sodium: 140 mmol/L (ref 135–145)

## 2014-08-27 MED ORDER — BOOST / RESOURCE BREEZE PO LIQD
1.0000 | Freq: Three times a day (TID) | ORAL | Status: DC
  Administered 2014-08-27 – 2014-08-28 (×2): 1 via ORAL

## 2014-08-27 MED ORDER — OXYCODONE-ACETAMINOPHEN 5-325 MG PO TABS: 1.0000 | ORAL_TABLET | Freq: Three times a day (TID) | ORAL | Status: DC | PRN

## 2014-08-27 NOTE — Progress Notes (Signed)
Initial Nutrition Assessment  DOCUMENTATION CODES:  Severe malnutrition in context of chronic illness  INTERVENTION:   Boost Breeze PO TID, each supplement provides 250 kcal and 9 grams of protein  Recommend liberalize diet to regular   NUTRITION DIAGNOSIS:  Malnutrition related to chronic illness as evidenced by severe depletion of body fat, severe depletion of muscle mass.   GOAL:  Patient will meet greater than or equal to 90% of their needs   MONITOR:  PO intake, Supplement acceptance, Labs, Weight trends  REASON FOR ASSESSMENT:  Malnutrition Screening Tool    ASSESSMENT:  Patient admitted on 5/5 with BLE intermittent claudication. S/P aortic stent graft placement.  Patient reports that she has lost a lot of weight, "I eat what I can. I only have a half stomach." She usually drinks Boost Breeze supplement at home even though it is expensive. She is in a lot of pain, receiving pain medications. She consumed 90% of breakfast this morning. She is severely underweight with BMI=13.5.  Labs reviewed. Potassium, BUN, and calcium low.  Height:  Ht Readings from Last 1 Encounters:  08/26/14 5\' 5"  (1.651 m)    Weight:  Wt Readings from Last 1 Encounters:  08/26/14 81 lb (36.741 kg)    Ideal Body Weight:  56.8 kg  Wt Readings from Last 10 Encounters:  08/26/14 81 lb (36.741 kg)  08/25/14 81 lb 9.1 oz (37 kg)  08/13/14 80 lb (36.288 kg)  07/16/14 82 lb 9.6 oz (37.467 kg)  06/25/14 86 lb (39.009 kg)  06/08/14 87 lb (39.463 kg)  06/02/14 84 lb 14 oz (38.5 kg)  06/02/14 89 lb (40.37 kg)  06/01/14 89 lb 9.6 oz (40.642 kg)  05/27/14 89 lb (40.37 kg)    BMI:  Body mass index is 13.48 kg/(m^2).  Estimated Nutritional Needs:  Kcal:  1200-1400  Protein:  65-75 gm  Fluid:  1.2 L  Skin:  Reviewed, no issues  Diet Order:  Diet Heart Room service appropriate?: Yes; Fluid consistency:: Thin  EDUCATION NEEDS:  Education needs addressed   Intake/Output  Summary (Last 24 hours) at 08/27/14 0959 Last data filed at 08/27/14 0700  Gross per 24 hour  Intake 3276.67 ml  Output   3035 ml  Net 241.67 ml    Last BM:  unknown   Molli Barrows, RD, LDN, [place] Pager [phone] After Hours Pager [phone]

## 2014-08-27 NOTE — Progress Notes (Signed)
Ankle Brachial Index completed. Preliminary results by tech - severely abnormal ABIs -  Right sided 0.36 and left sided 0.38. Oda Cogan, BS, RDMS, RVT

## 2014-08-27 NOTE — Progress Notes (Signed)
Pt. To transfer to 2W32 report given to receiving nurse Rena, all questions answered at this time.  Pt. VSS with no s/s of distress.  Patient stable at transfer.

## 2014-08-27 NOTE — Telephone Encounter (Signed)
-----   Message from Mena Goes, RN sent at 08/27/2014 11:33 AM EDT ----- Regarding: Schedule   ----- Message -----    From: Gabriel Earing, PA-C    Sent: 08/27/2014   8:34 AM      To: Vvs Charge Pool  S/p Endovascular repair of aorta  F/u with Dr. Bridgett Larsson in 4 weeks.  Does not need CTA at follow up per Dr. Bridgett Larsson.  Thanks, Aldona Bar

## 2014-08-27 NOTE — Care Management Note (Signed)
Case Management Note  Patient Details  Name: Cynthia Bowen MRN: [id number] Date of Birth: 15-Oct-1945  Subjective/Objective:    Pt admitted with PAD                Action/Plan:  CM will continue to monitor for disposition needs.   Expected Discharge Date:  08/27/14               Expected Discharge Plan:  [place]  In-House Referral:     Discharge planning Services  CM Consult  Post Acute Care Choice:    Choice offered to:  Patient  DME Arranged:    DME Agency:     HH Arranged:  PT [place]:  [place]  Status of Service:  In process, will continue to follow  Medicare Important Message Given:    Date Medicare IM Given:    Medicare IM give by:    Date Additional Medicare IM Given:    Additional Medicare Important Message give by:     If discussed at [place] of Stay Meetings, dates discussed:    Disposition: Home with [place]  Additional Comments: 08/27/14 Elenor Quinones RN BSN CM; CM gave pt choice for HHPT.  [facility] chose [facility].  [facility] with referral, referral was accepted.  No additional CM needs at this time, CM will continue to follow for disposition needs.  [name], RN 08/27/2014, 3:39 PM

## 2014-08-27 NOTE — Progress Notes (Signed)
Ur ins review done

## 2014-08-27 NOTE — Progress Notes (Addendum)
[facility] Progress Note  08/27/2014 7:54 AM 1 Day Post-Op  Subjective:  Feeling sore in groins and having numbness in feet that is same as before surgery.   Filed Vitals:   08/27/14 0303  BP: 127/54  Pulse: 72  Temp: 99.5 F (37.5 C)  Resp: 17    Physical Exam: Incisions:  Right groin wound c/d/i. Left groin dressed. No hematoma.  Extremities:  Brisk dorsalis pedis doppler signals bilaterally.   CBC    Component Value Date/Time   WBC 9.4 08/26/2014 1600   WBC 7.5 07/16/2014 0950   WBC 8.2 08/26/2012 1300   RBC 4.30 08/26/2014 1600   RBC 4.47 07/16/2014 0950   RBC 3.73* 08/26/2012 1300   RBC 2.75* 08/06/2012 0800   HGB 12.5 08/26/2014 1600   HGB 12.6 07/16/2014 0950   HGB 10.6* 08/26/2012 1300   HCT 36.4 08/26/2014 1600   HCT 39.9 07/16/2014 0950   HCT 31.2* 08/26/2012 1300   PLT 205 08/26/2014 1600   PLT 516* 08/26/2012 1300   MCV 84.7 08/26/2014 1600   MCV 89.2 07/16/2014 0950   MCV 84 08/26/2012 1300   MCH 29.1 08/26/2014 1600   MCH 28.2 07/16/2014 0950   MCH 28.3 08/26/2012 1300   MCHC 34.3 08/26/2014 1600   MCHC 31.6* 07/16/2014 0950   MCHC 33.9 08/26/2012 1300   RDW 16.9* 08/26/2014 1600   RDW 17.1* 08/26/2012 1300   LYMPHSABS 1.4 06/02/2014 1128   LYMPHSABS 1.4 08/26/2012 1300   MONOABS 0.9 06/02/2014 1128   MONOABS 0.3 08/26/2012 1300   EOSABS 0.0 06/02/2014 1128   EOSABS 0.1 08/26/2012 1300   BASOSABS 0.0 06/02/2014 1128   BASOSABS 0.0 08/26/2012 1300    BMET    Component Value Date/Time   NA 140 08/27/2014 0615   NA 138 08/26/2012 1300   K 3.2* 08/27/2014 0615   K 4.0 08/26/2012 1300   CL 103 08/27/2014 0615   CL 108* 08/26/2012 1300   CO2 28 08/27/2014 0615   CO2 24 08/26/2012 1300   GLUCOSE 87 08/27/2014 0615   GLUCOSE 83 08/26/2012 1300   BUN 5* 08/27/2014 0615   BUN 29* 08/26/2012 1300   BUN 4 08/19/2009   CREATININE 0.61 08/27/2014 0615   CREATININE 0.61 07/16/2014 0948   CREATININE 0.79 08/26/2012 1300     CALCIUM 8.5* 08/27/2014 0615   CALCIUM 9.9 08/26/2012 1300   GFRNONAA >60 08/27/2014 0615   GFRNONAA >89 07/16/2014 0948   GFRNONAA >60 08/26/2012 1300   GFRAA >60 08/27/2014 0615   GFRAA >89 07/16/2014 0948   GFRAA >60 08/26/2012 1300    INR    Component Value Date/Time   INR 1.04 08/26/2014 1600     Intake/Output Summary (Last 24 hours) at 08/27/14 0754 Last data filed at 08/27/14 0600  Gross per 24 hour  Intake 3276.67 ml  Output   2885 ml  Net 391.67 ml     Assessment:  69 y.o. female is s/p:endovascular repair of aorta using aorto-aortic tube prosthesis, IVUS, stenting and angioplasty left common iliac artery  1 Day Post-Op  Plan: -Brisk doppler flow to feet bilaterally. Numbness of feet bilaterally, this is same as before surgery.  -Mobilize today. -Transfer to 2W -Patient lives alone. Case management consult for home health PT.   [name], PA-C [facility] Office: [phone] Pager: [phone] 08/27/2014 7:54 AM  Addendum  I have independently interviewed and examined the patient, and I agree with the physician assistant's findings.  Pt had no next to flatline doppler signals preop.  Now she has easily dopplerable DP signals.  Obtain ABI before d/c.  If pt's H/H stable tomorrow ok to d/c tomorrow  Adele Barthel, MD [facility] [place] Office: [phone] Pager: [phone]  08/27/2014, 9:04 AM

## 2014-08-27 NOTE — Discharge Summary (Signed)
[facility] Discharge Summary   Cynthia Bowen 1945/05/20 69 y.o. female  MRN: [id number]  Admission Date: 08/26/2014  Discharge Date: 08/28/14  Physician: No att. providers found  Admission Diagnosis: Peripheral vascular disease with bilateral lower extremity claudication I70.213; Aortic stenosis    HPI:   This is a 69 y.o. female patient of Dr. Bridgett Larsson who returns for 3 months evaluation of BLE intermittent claudication. She was seen by Dr. Bridgett Larsson in September 2015 for initial evaluation.  She returns today for follow up. She was faithful in her graduated walking program until about 3 weeks ago she developed a tight feeling in her waist to her legs and toes of both LE's, this is gradually worsening; this is at rest; if she tries to walk the tightness and pain are worse. Also about 3 weeks ago her left heel became red/cyanotic, small laceration on left heel is not healing.   Onset of symptom occurred after her abdominal surgery. At her September 2015 visit her pain was described as cramping, severity 3-5/10, and associated with walking >50 feet. Patient attempted to treat this pain with rest. Atherosclerotic risk factors include: former smoker, quit in 2006.   The patient is currently not on a statin as not indicated.  The patient is currently not on an anti-platelet: due to bleeding risk with recent gastric perforation.  Pt denies any history of stroke or TIA, denies any cardiac problems. Pt states she has a big appetite, she is not able to gain weight, is not losing weight. She denies post prandial abdominal pain but is unable to eat much since so much of her stomach was removed.  The patient reports New Medical or Surgical History: was hospitalized at [facility] overnight for evaluation of swollen right hand; pt does not know if any etiology was found for this, swelling resolved with medication including prednisone; she has known arthritis.   Hospital Course:  The patient was admitted  to the hospital and taken to the operating room on 08/26/2014 and underwent:  Right femoral artery exposure and repair  Open cannulation of right common femoral artery  Placement of catheter in aorta x 2  Cannulation of left common femoral artery under ultrasound guidance  Intravascular ultrasound  Endovascular repair of aorta using aorto-aortic tube prosthesis (16 mm x 14 mm x 7 cm)  Stenting and angioplasty left common iliac artery (balloon expandable uncovered stent 7 mm x 38 mm)     The pt tolerated the procedure well and was transported to the PACU in good condition.   In the PACU, her systolic BP was reading 616'W, however, her bilateral cuff pressures were 737'T and her systolic BP on arrival to the hospital was 155mmHg.  Her BP was measured by cuff thereafter.  Her aline did have a slight whip in the waveform.  By POD 1, she did have brisk doppler flow to both feet.  She did have numbness, but this was unchanged from pre operatively.  She was transferred to the telemetry floor that day.  Per Dr. Trula Slade,  Pt had no next to flatline doppler signals preop. Now she has easily dopplerable DP signals. Obtain ABI before d/c. If pt's H/H stable tomorrow ok to d/c tomorrow.  Ankle Brachial Index completed on 08/27/14. Preliminary results by tech - severely abnormal ABIs - Right sided 0.36 and left sided 0.38  By POD 2, the pain in her legs improved and she was feeling better overall.  She does have nausea pretty regularly prior to admission.  Her H/H stable.  Pt is d/c'd home.  The remainder of the hospital course consisted of increasing mobilization and increasing intake of solids without difficulty.  CBC    Component Value Date/Time   WBC 9.5 08/27/2014 0615   WBC 7.5 07/16/2014 0950   WBC 8.2 08/26/2012 1300   RBC 3.76* 08/27/2014 0615   RBC 4.47 07/16/2014 0950   RBC 3.73* 08/26/2012 1300   RBC 2.75* 08/06/2012 0800   HGB 11.5* 08/28/2014 0909   HGB 12.6 07/16/2014 0950    HGB 10.6* 08/26/2012 1300   HCT 34.3* 08/28/2014 0909   HCT 39.9 07/16/2014 0950   HCT 31.2* 08/26/2012 1300   PLT 194 08/27/2014 0615   PLT 516* 08/26/2012 1300   MCV 85.9 08/27/2014 0615   MCV 89.2 07/16/2014 0950   MCV 84 08/26/2012 1300   MCH 29.3 08/27/2014 0615   MCH 28.2 07/16/2014 0950   MCH 28.3 08/26/2012 1300   MCHC 34.1 08/27/2014 0615   MCHC 31.6* 07/16/2014 0950   MCHC 33.9 08/26/2012 1300   RDW 17.0* 08/27/2014 0615   RDW 17.1* 08/26/2012 1300   LYMPHSABS 1.4 06/02/2014 1128   LYMPHSABS 1.4 08/26/2012 1300   MONOABS 0.9 06/02/2014 1128   MONOABS 0.3 08/26/2012 1300   EOSABS 0.0 06/02/2014 1128   EOSABS 0.1 08/26/2012 1300   BASOSABS 0.0 06/02/2014 1128   BASOSABS 0.0 08/26/2012 1300    BMET    Component Value Date/Time   NA 140 08/27/2014 0615   NA 138 08/26/2012 1300   K 3.2* 08/27/2014 0615   K 4.0 08/26/2012 1300   CL 103 08/27/2014 0615   CL 108* 08/26/2012 1300   CO2 28 08/27/2014 0615   CO2 24 08/26/2012 1300   GLUCOSE 87 08/27/2014 0615   GLUCOSE 83 08/26/2012 1300   BUN 5* 08/27/2014 0615   BUN 29* 08/26/2012 1300   BUN 4 08/19/2009   CREATININE 0.61 08/27/2014 0615   CREATININE 0.61 07/16/2014 0948   CREATININE 0.79 08/26/2012 1300   CALCIUM 8.5* 08/27/2014 0615   CALCIUM 9.9 08/26/2012 1300   GFRNONAA >60 08/27/2014 0615   GFRNONAA >89 07/16/2014 0948   GFRNONAA >60 08/26/2012 1300   GFRAA >60 08/27/2014 0615   GFRAA >89 07/16/2014 0948   GFRAA >60 08/26/2012 1300           Discharge Instructions    ABDOMINAL PROCEDURE/ANEURYSM REPAIR/AORTO-BIFEMORAL BYPASS:  Call MD for increased abdominal pain; cramping diarrhea; nausea/vomiting    Complete by:  As directed      Call MD for:  redness, tenderness, or signs of infection (pain, swelling, bleeding, redness, odor or green/yellow discharge around incision site)    Complete by:  As directed      Call MD for:  severe or increased pain, loss or decreased feeling  in affected limb(s)     Complete by:  As directed      Call MD for:  temperature >100.5    Complete by:  As directed      Driving Restrictions    Complete by:  As directed   No driving for 2 weeks     Lifting restrictions    Complete by:  As directed   No lifting for 4 weeks     Resume previous diet    Complete by:  As directed      may wash over wound with mild soap and water    Complete by:  As directed  Discharge Diagnosis:  Peripheral vascular disease with bilateral lower extremity claudication I70.213; Aortic stenosis   Secondary Diagnosis: Patient Active Problem List   Diagnosis Date Noted  . PAD (peripheral artery disease) 08/26/2014  . Discoloration of skin- Left Heel 08/13/2014  . Left foot pain- Heel 08/13/2014  . Leg heaviness-Bilateral 08/13/2014  . Protein-calorie malnutrition, severe 06/04/2014  . Clostridium difficile diarrhea 06/03/2014  . Cellulitis of hand 06/02/2014  . Hypokalemia 06/02/2014  . Osteoporosis 05/22/2014  . Recurrent abdominal pain--multiple causes, status post GI surgery with multiple complications 3220 25/42/7062  . Abnormal CT scan 02/08/2014  . Atherosclerosis of native arteries of extremity with intermittent claudication 01/15/2014  . Nephrolithiasis-asymptomatic/noted on scan 10/08/2013  . Other and unspecified ovarian cyst--noted on scans 2014 2015/asymptomatic 10/08/2013  . Pancreatic lesion-6 mm noted on MRI at [facility] 10/08/2013  . 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 Protein calorie malnutrition 01/15/2013  . PE (pulmonary embolism) 08/05/2012  . Incarcerated epigastric hernia s/p primary repair 07/02/2012 07/11/2012  . 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 [id number] 07/10/2012  . Pericardial effusion 04/17/2012  . COPD GOLD I 01/29/2012  . Back pain DDD S/p surgery [place] 1993 12/29/2011  . Depression with anxiety 04/02/2011  . GERD  (gastroesophageal reflux disease) 04/02/2011  . Osteoarthritis 04/02/2011  . Cervical neck pain with evidence of disc disease-s/p surgery [id number] 04/02/2011  . Fibromyalgia-[place] 2004 04/02/2011   Past Medical History  Diagnosis Date  . COPD (chronic obstructive pulmonary disease)   . Pneumonia 12-2011  . GERD (gastroesophageal reflux disease)   . Headache(784.0)   . Arthritis     osteoarthritis  . Allergy   . Depression   . Neuromuscular disorder   . Osteoporosis   . Bronchitis     CURRENTLY AS OF 06/30/12 - HAS COUGH AND FINISHED ANTIBIOTIC FOR BRONCHITIS  . Fibromyalgia   . Pain     ABDOMINAL PAIN AND NAUSEA  . Pain     SOMETIMES PAIN RIGHT EAR AND NECK--STATES CAUSED BY A "LUMP" ON BACK OF EAR--USES KENALOG CREAM TOPICALLY AS NEEDED.  Marland Kitchen Gastrocutaneous fistula   . Anemia   . Anxiety   . Blood transfusion without reported diagnosis   . Heart murmur     young  . Peripheral vascular disease     hx  ?leg  . History of kidney stones        Medication List    TAKE these medications        albuterol 108 (90 BASE) MCG/ACT inhaler  Commonly known as:  PROVENTIL HFA;VENTOLIN HFA  Inhale 2 puffs into the lungs every 6 (six) hours as needed for wheezing.     CALCIUM PO  Take 1 tablet by mouth daily.     clidinium-chlordiazePOXIDE 5-2.5 MG per capsule  Commonly known as:  LIBRAX  TAKE ONE CAPSULE BY MOUTH 4 TIMES A DAY BEFORE MEALS AND AT BEDTIME, AS NEEDED FOR ABDOMINAL PAIN     dicyclomine 20 MG tablet  Commonly known as:  BENTYL  Take 1 tablet (20 mg total) by mouth 4 (four) times daily -  before meals and at bedtime.     diphenhydrAMINE 12.5 MG/5ML liquid  Commonly known as:  BENADRYL  Take 12.5 mg by mouth 4 (four) times daily as needed for itching.     EPINEPHrine 0.3 mg/0.3 mL Soaj injection  Commonly known as:  EPIPEN  Inject 0.3 mLs (0.3 mg total) into the muscle daily as  needed (anaphylaxis).     feeding supplement (RESOURCE BREEZE) Liqd  Take 1  Container by mouth 3 (three) times daily between meals.     fluconazole 150 MG tablet  Commonly known as:  DIFLUCAN  Take 1 tablet (150 mg total) by mouth once.     fluocinonide cream 0.05 %  Commonly known as:  LIDEX  Apply 1 application topically 2 (two) times daily as needed (rash).     GAS-X EXTRA STRENGTH PO  Take 1 tablet by mouth 3 (three) times daily.     ketoconazole 2 % shampoo  Commonly known as:  NIZORAL  Apply 1 application topically 2 (two) times a week.     metroNIDAZOLE 500 MG tablet  Commonly known as:  FLAGYL  Take 1 tablet (500 mg total) by mouth every 8 (eight) hours.     multivitamin tablet  Take 1 tablet by mouth daily.     omeprazole 40 MG capsule  Commonly known as:  PRILOSEC  Take 1 capsule (40 mg total) by mouth daily.     ondansetron 4 MG tablet  Commonly known as:  ZOFRAN  TAKE 1 TABLET (4 MG TOTAL) BY MOUTH EVERY 8 (EIGHT) HOURS AS NEEDED FOR NAUSEA OR VOMITING.     oxyCODONE-acetaminophen 5-325 MG per tablet  Commonly known as:  ROXICET  Take 1 tablet by mouth every 8 (eight) hours as needed for severe pain.     polyethylene glycol powder powder  Commonly known as:  GLYCOLAX/MIRALAX  Take 17 g by mouth daily.     potassium chloride SA 20 MEQ tablet  Commonly known as:  K-DUR,KLOR-CON  Take 1 tablet BID for 2 days, Take 1 tablet once a day for 4 days     predniSONE 10 MG tablet  Commonly known as:  DELTASONE  Take 3 tabs daily 3 days, then 2 tabs daily 3 days, then 1 tab daily 3 days, then stop.     PROBIOTIC DAILY PO  Take 1 tablet by mouth daily.     ranitidine 150 MG tablet  Commonly known as:  ZANTAC  Take 1 tablet (150 mg total) by mouth 2 (two) times daily.     saccharomyces boulardii 250 MG capsule  Commonly known as:  FLORASTOR  Take 1 capsule (250 mg total) by mouth 2 (two) times daily.     sucralfate 1 G tablet  Commonly known as:  CARAFATE  Take 1 tablet (1 g total) by mouth 4 (four) times daily -  with meals and  at bedtime.     traMADol 50 MG tablet  Commonly known as:  ULTRAM  Take 1 tablet (50 mg total) by mouth every 6 (six) hours as needed.     Vitamin D (Ergocalciferol) 50000 UNITS Caps capsule  Commonly known as:  DRISDOL  Take 1 capsule (50,000 Units total) by mouth every 7 (seven) days.        Prescriptions given: Roxicet#30 No Refill  Instructions: 1.  No heavy lifting x 4 weeks 2.  No driving x 2 weeks  Disposition: home  Patient's condition: is Good  Follow up: 1. Dr. Bridgett Larsson in 4 weeks (Does not need CTA per Dr. Bridgett Larsson)   Leontine Locket, PA-C [facility] [phone] 08/30/2014  9:23 AM  Addendum  I have independently interviewed and examined the patient, and I agree with the physician assistant's discharge summary.  Pt underwent a complicated aortic stenting and left common iliac artery stenting.  The patient was observed  for an extra day post-operatively due to intentional aortic trauma due to stenting and angioplasty of the severely stenotic abdominal aorta.  The patient never develops hemodynamic instability or blood loss.  The patient post-operative course was unremarkable.  Adele Barthel, MD [facility] [place] Office: [phone] Pager: [phone]  08/30/2014, 12:20 PM    - For VQI Registry use --- Instructions: Press F2 to tab through selections.  Delete question if not applicable.   Post-op:  Time to Extubation: [x]  In OR, [ ]  < 12 hrs, [ ]  12-24 hrs, [ ]  >=24 hrs Vasopressors Req. Post-op: No MI: No., [ ]  Troponin only, [ ]  EKG or Clinical New Arrhythmia: No CHF: No ICU Stay: 1 day in stepdown Transfusion: No  If yes, n/a units given  Complications: Resp failure: No., [ ]  Pneumonia, [ ]  Ventilator Chg in renal function: No., [ ]  Inc. Cr > 0.5, [ ]  Temp. Dialysis, [ ]  Permanent dialysis Leg ischemia: No., no Surgery needed, [ ]  Yes, Surgery needed, [ ]  Amputation Bowel ischemia: No., [ ]  Medical Rx, [ ]   Surgical Rx Wound complication: No., [ ]  Superficial separation/infection, [ ]  Return to OR Return to OR: No  Return to OR for bleeding: No Stroke: No., [ ]  Minor, [ ]  Major  Discharge medications: Statin use:  No If No: [ ]  For Medical reasons, [ ]  Non-compliant, [ ]  Not-indicated ASA use:  No  If No: [ ]  For Medical reasons, [ ]  Non-compliant, [ ]  Not-indicated Plavix use:  No If No: [ ]  For Medical reasons, [ ]  Non-compliant, [ ]  Not-indicated Beta blocker use:  No If No: [ ]  For Medical reasons, [ ]  Non-compliant, [ ]  Not-indicated

## 2014-08-27 NOTE — Telephone Encounter (Signed)
LM for pt re appt, dpm °

## 2014-08-28 LAB — HEMOGLOBIN AND HEMATOCRIT, BLOOD
HEMATOCRIT: 34.3 % — AB (ref 36.0–46.0)
HEMOGLOBIN: 11.5 g/dL — AB (ref 12.0–15.0)

## 2014-08-28 NOTE — Care Management Note (Signed)
Case Management Note  Patient Details  Name: Cynthia Bowen MRN: [id number] Date of Birth: 1945/11/20  Subjective/Objective:                    Action/Plan:   Expected Discharge Date:  08/27/14               Expected Discharge Plan:  [place]  In-House Referral:     Discharge planning Services  CM Consult  Post Acute Care Choice:    Choice offered to:  Patient  DME Arranged:  Walker rolling DME Agency:  [place]:  PT [facility] Agency:  [employer]  Status of Service:  Completed, signed off  Medicare Important Message Given:    Date Medicare IM Given:    Medicare IM give by:    Date Additional Medicare IM Given:    Additional Medicare Important Message give by:     If discussed at [place] of Stay Meetings, dates discussed:    Additional Comments: Cm received call from RN stating pt left without walker.  CM called [facility] DME rep, Germaine who states he will ensure walker is ent to pt's home.  Notification of discharge made to [facility] rep, Tiffany.  No other CM needs were communicated.   Dellie Catholic, RN 08/28/2014, 12:35 PM

## 2014-08-28 NOTE — Progress Notes (Addendum)
[facility] Progress Note  08/28/2014 8:41 AM 2 Days Post-Op  Subjective:  Numbness in feet improved.  Filed Vitals:   08/28/14 0615  BP: 126/68  Pulse: 86  Temp: 99 F (37.2 C)  Resp: 18    Physical Exam: Incisions:  Right groin incision healing well. Left groin no hematoma.  Extremities:  Brisk biphasic DP doppler signals bilaterally.   CBC    Component Value Date/Time   WBC 9.5 08/27/2014 0615   WBC 7.5 07/16/2014 0950   WBC 8.2 08/26/2012 1300   RBC 3.76* 08/27/2014 0615   RBC 4.47 07/16/2014 0950   RBC 3.73* 08/26/2012 1300   RBC 2.75* 08/06/2012 0800   HGB 11.0* 08/27/2014 0615   HGB 12.6 07/16/2014 0950   HGB 10.6* 08/26/2012 1300   HCT 32.3* 08/27/2014 0615   HCT 39.9 07/16/2014 0950   HCT 31.2* 08/26/2012 1300   PLT 194 08/27/2014 0615   PLT 516* 08/26/2012 1300   MCV 85.9 08/27/2014 0615   MCV 89.2 07/16/2014 0950   MCV 84 08/26/2012 1300   MCH 29.3 08/27/2014 0615   MCH 28.2 07/16/2014 0950   MCH 28.3 08/26/2012 1300   MCHC 34.1 08/27/2014 0615   MCHC 31.6* 07/16/2014 0950   MCHC 33.9 08/26/2012 1300   RDW 17.0* 08/27/2014 0615   RDW 17.1* 08/26/2012 1300   LYMPHSABS 1.4 06/02/2014 1128   LYMPHSABS 1.4 08/26/2012 1300   MONOABS 0.9 06/02/2014 1128   MONOABS 0.3 08/26/2012 1300   EOSABS 0.0 06/02/2014 1128   EOSABS 0.1 08/26/2012 1300   BASOSABS 0.0 06/02/2014 1128   BASOSABS 0.0 08/26/2012 1300    BMET    Component Value Date/Time   NA 140 08/27/2014 0615   NA 138 08/26/2012 1300   K 3.2* 08/27/2014 0615   K 4.0 08/26/2012 1300   CL 103 08/27/2014 0615   CL 108* 08/26/2012 1300   CO2 28 08/27/2014 0615   CO2 24 08/26/2012 1300   GLUCOSE 87 08/27/2014 0615   GLUCOSE 83 08/26/2012 1300   BUN 5* 08/27/2014 0615   BUN 29* 08/26/2012 1300   BUN 4 08/19/2009   CREATININE 0.61 08/27/2014 0615   CREATININE 0.61 07/16/2014 0948   CREATININE 0.79 08/26/2012 1300   CALCIUM 8.5* 08/27/2014 0615   CALCIUM 9.9 08/26/2012  1300   GFRNONAA >60 08/27/2014 0615   GFRNONAA >89 07/16/2014 0948   GFRNONAA >60 08/26/2012 1300   GFRAA >60 08/27/2014 0615   GFRAA >89 07/16/2014 0948   GFRAA >60 08/26/2012 1300    INR    Component Value Date/Time   INR 1.04 08/26/2014 1600     Intake/Output Summary (Last 24 hours) at 08/28/14 0841 Last data filed at 08/28/14 4742  Gross per 24 hour  Intake    730 ml  Output      0 ml  Net    730 ml     Assessment:  69 y.o. female is s/p: endovascular repair of aorta using aorto-aortic tube prosthesis, IVUS, stenting and angioplasty left common iliac artery  2 Days Post-Op  Plan: -Numbness in feet improved. -Brisk doppler signals bilaterally and waveforms significantly improved from pre-op. ABIs R: 0.36, L 0.38 -Will check H/H today.  -Has ambulated in halls.  -Plan for d/c today if H/H ok.     [name], PA-C [facility] Office: [phone] Pager: [phone] 08/28/2014 8:41 AM     I agree with the above.  I have seen and evaluated the patient.  Bilateral groins look good.  Her legs feel better.  She does have some nausea which she has pretty regularly.  She wants to go home today.  Her hematocrit has increased.  She'll be discharged.  Annamarie Major

## 2014-08-28 NOTE — Progress Notes (Signed)
08/28/2014 1200 D/c avs form, medications already taken today and those due this evening given and explained to patient. Follow up appointments and when to call MD reviewed. RX reviewed. D/c iv. D/c tele. D/c home per orders.  Cynthia Bowen, Arville Lime

## 2014-08-30 ENCOUNTER — Encounter (HOSPITAL_COMMUNITY): Payer: Self-pay | Admitting: Vascular Surgery

## 2014-08-30 NOTE — Discharge Summary (Deleted)
[facility]  Discharge Summary  Cynthia Bowen [birth year] 69 y.o. female  [id number]  Admission Date: 08/26/2014  Discharge Date: 08/28/2014  Physician: Adele Barthel, MD  Admission Diagnosis: Peripheral vascular disease with bilateral lower extremity claudication I70.213; Aortic stenosis   HPI:   This is a 69 y.o. female patient of Dr. Bridgett Larsson who returns for 3 months evaluation of BLE intermittent claudication. She was seen by Dr. Bridgett Larsson in September 2015 for initial evaluation.  She was faithful in her graduated walking program until about 3 weeks ago she developed a tight feeling in her waist to her legs and toes of both LE's, this is gradually worsening; this is at rest; if she tries to walk the tightness and pain are worse. Also about 3 weeks ago her left heel became red/cyanotic, small laceration on left heel is not healing.   Onset of symptom occurred after her abdominal surgery. At her September 2015 visit her pain was described as cramping, severity 3-5/10, and associated with walking >50 feet. Patient attempted to treat this pain with rest. Atherosclerotic risk factors include: former smoker, quit in 2006.  1. The patient is currently not on a statin as not indicated. 2. The patient is currently not on an anti-platelet: due to bleeding risk with recent gastric perforation.  Pt denies any history of stroke or TIA, denies any cardiac problems. Pt states she has a big appetite, she is not able to gain weight, is not losing weight. She denies post prandial abdominal pain but is unable to eat much since so much of her stomach was removed.  The patient reports New Medical or Surgical History: was hospitalized at [facility] overnight for evaluation of swollen right hand; pt does not know if any etiology was found for this, swelling resolved with medication including prednisone; she has known arthritis.   She had significant deterioration in perfusion of both lower  extremities in the last three months no doppler signals detected in the left leg and only a monophasic anterior tibial signal in the right leg.   Hospital Course:  The patient was admitted to the hospital and taken to the operating room on 08/26/2014 and underwent:  1. Right femoral artery exposure and repair 2. Open cannulation of right common femoral artery 3. Placement of catheter in aorta x 2 4. Cannulation of left common femoral artery under ultrasound guidance 5. Intravascular ultrasound 6. Endovascular repair of aorta using aorto-aortic tube prosthesis (16 mm x 14 mm x 7 cm) 7. Stenting and angioplasty left common iliac artery (balloon expandable uncovered stent 7 mm x 38 mm)  At the end of the case, there were palpable bilateral femoral pulses which were not present previously.  The patient tolerated the procedure well and was transported to the PACU in stable condition.   POD 1: She had easily dopplerable DP signals which were not present pre-operatively. In right incision was clean and intact and left groin without hematoma. She was transferred to the floor.   POD 2: Her ABIs revealed: R 0.36 and L 0.38. Her h/h was stable. Her pain was well controlled and ambulating adequately with assistance. She was discharged home on POD 2 in good condition. Home health physical therapy was arranged.    CBC    Component Value Date/Time   WBC 9.5 08/27/2014 0615   WBC 7.5 07/16/2014 0950   WBC 8.2 08/26/2012 1300   RBC 3.76* 08/27/2014 0615   RBC 4.47 07/16/2014 0950   RBC 3.73* 08/26/2012  1300   RBC 2.75* 08/06/2012 0800   HGB 11.5* 08/28/2014 0909   HGB 12.6 07/16/2014 0950   HGB 10.6* 08/26/2012 1300   HCT 34.3* 08/28/2014 0909   HCT 39.9 07/16/2014 0950   HCT 31.2* 08/26/2012 1300   PLT 194 08/27/2014 0615   PLT 516* 08/26/2012 1300   MCV 85.9 08/27/2014 0615   MCV 89.2 07/16/2014 0950   MCV 84 08/26/2012 1300   MCH 29.3 08/27/2014 0615   MCH 28.2 07/16/2014 0950   MCH 28.3  08/26/2012 1300   MCHC 34.1 08/27/2014 0615   MCHC 31.6* 07/16/2014 0950   MCHC 33.9 08/26/2012 1300   RDW 17.0* 08/27/2014 0615   RDW 17.1* 08/26/2012 1300   LYMPHSABS 1.4 06/02/2014 1128   LYMPHSABS 1.4 08/26/2012 1300   MONOABS 0.9 06/02/2014 1128   MONOABS 0.3 08/26/2012 1300   EOSABS 0.0 06/02/2014 1128   EOSABS 0.1 08/26/2012 1300   BASOSABS 0.0 06/02/2014 1128   BASOSABS 0.0 08/26/2012 1300    BMET    Component Value Date/Time   NA 140 08/27/2014 0615   NA 138 08/26/2012 1300   K 3.2* 08/27/2014 0615   K 4.0 08/26/2012 1300   CL 103 08/27/2014 0615   CL 108* 08/26/2012 1300   CO2 28 08/27/2014 0615   CO2 24 08/26/2012 1300   GLUCOSE 87 08/27/2014 0615   GLUCOSE 83 08/26/2012 1300   BUN 5* 08/27/2014 0615   BUN 29* 08/26/2012 1300   BUN 4 08/19/2009   CREATININE 0.61 08/27/2014 0615   CREATININE 0.61 07/16/2014 0948   CREATININE 0.79 08/26/2012 1300   CALCIUM 8.5* 08/27/2014 0615   CALCIUM 9.9 08/26/2012 1300   GFRNONAA >60 08/27/2014 0615   GFRNONAA >89 07/16/2014 0948   GFRNONAA >60 08/26/2012 1300   GFRAA >60 08/27/2014 0615   GFRAA >89 07/16/2014 0948   GFRAA >60 08/26/2012 1300     Discharge Instructions:   The patient is discharged to home with extensive instructions on wound care and progressive ambulation.  They are instructed not to drive or perform any heavy lifting until returning to see the physician in his office.  Discharge Instructions    ABDOMINAL PROCEDURE/ANEURYSM REPAIR/AORTO-BIFEMORAL BYPASS:  Call MD for increased abdominal pain; cramping diarrhea; nausea/vomiting    Complete by:  As directed      Call MD for:  redness, tenderness, or signs of infection (pain, swelling, bleeding, redness, odor or green/yellow discharge around incision site)    Complete by:  As directed      Call MD for:  severe or increased pain, loss or decreased feeling  in affected limb(s)    Complete by:  As directed      Call MD for:  temperature >100.5     Complete by:  As directed      Driving Restrictions    Complete by:  As directed   No driving for 2 weeks     Lifting restrictions    Complete by:  As directed   No lifting for 4 weeks     Resume previous diet    Complete by:  As directed      may wash over wound with mild soap and water    Complete by:  As directed            Discharge Diagnosis:  Peripheral vascular disease with bilateral lower extremity claudication I70.213; Aortic stenosis   Secondary Diagnosis: Patient Active Problem List   Diagnosis Date Noted  . PAD (peripheral  artery disease) 08/26/2014  . Discoloration of skin- Left Heel 08/13/2014  . Left foot pain- Heel 08/13/2014  . Leg heaviness-Bilateral 08/13/2014  . Protein-calorie malnutrition, severe 06/04/2014  . Clostridium difficile diarrhea 06/03/2014  . Cellulitis of hand 06/02/2014  . Hypokalemia 06/02/2014  . Osteoporosis 05/22/2014  . Recurrent abdominal pain--multiple causes, status post GI surgery with multiple complications [id number] [id number]/[id number]  . Abnormal CT scan 02/08/2014  . Atherosclerosis of native arteries of extremity with intermittent claudication 01/15/2014  . Nephrolithiasis-asymptomatic/noted on scan 10/08/2013  . Other and unspecified ovarian cyst--noted on scans 2014 2015/asymptomatic 10/08/2013  . Pancreatic lesion-6 mm noted on MRI at [facility] 10/08/2013  . 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 Protein calorie malnutrition 01/15/2013  . PE (pulmonary embolism) 08/05/2012  . Incarcerated epigastric hernia s/p primary repair 07/02/2012 07/11/2012  . 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 [id number] 07/10/2012  . Pericardial effusion 04/17/2012  . COPD GOLD I 01/29/2012  . Back pain DDD S/p surgery [place] 1993 12/29/2011  . Depression with anxiety 04/02/2011  . GERD (gastroesophageal reflux disease) 04/02/2011  . Osteoarthritis 04/02/2011  .  Cervical neck pain with evidence of disc disease-s/p surgery [id number] 04/02/2011  . Fibromyalgia-[place]war 2004 04/02/2011   Past Medical History  Diagnosis Date  . COPD (chronic obstructive pulmonary disease)   . Pneumonia 12-2011  . GERD (gastroesophageal reflux disease)   . Headache(784.0)   . Arthritis     osteoarthritis  . Allergy   . Depression   . Neuromuscular disorder   . Osteoporosis   . Bronchitis     CURRENTLY AS OF 06/30/12 - HAS COUGH AND FINISHED ANTIBIOTIC FOR BRONCHITIS  . Fibromyalgia   . Pain     ABDOMINAL PAIN AND NAUSEA  . Pain     SOMETIMES PAIN RIGHT EAR AND NECK--STATES CAUSED BY A "LUMP" ON BACK OF EAR--USES KENALOG CREAM TOPICALLY AS NEEDED.  Marland Kitchen Gastrocutaneous fistula   . Anemia   . Anxiety   . Blood transfusion without reported diagnosis   . Heart murmur     young  . Peripheral vascular disease     hx  ?leg  . History of kidney stones        Medication List    TAKE these medications        albuterol 108 (90 BASE) MCG/ACT inhaler  Commonly known as:  PROVENTIL HFA;VENTOLIN HFA  Inhale 2 puffs into the lungs every 6 (six) hours as needed for wheezing.     CALCIUM PO  Take 1 tablet by mouth daily.     clidinium-chlordiazePOXIDE 5-2.5 MG per capsule  Commonly known as:  LIBRAX  TAKE ONE CAPSULE BY MOUTH 4 TIMES A DAY BEFORE MEALS AND AT BEDTIME, AS NEEDED FOR ABDOMINAL PAIN     dicyclomine 20 MG tablet  Commonly known as:  BENTYL  Take 1 tablet (20 mg total) by mouth 4 (four) times daily -  before meals and at bedtime.     diphenhydrAMINE 12.5 MG/5ML liquid  Commonly known as:  BENADRYL  Take 12.5 mg by mouth 4 (four) times daily as needed for itching.     EPINEPHrine 0.3 mg/0.3 mL Soaj injection  Commonly known as:  EPIPEN  Inject 0.3 mLs (0.3 mg total) into the muscle daily as needed (anaphylaxis).     feeding supplement (RESOURCE BREEZE) Liqd  Take 1 Container by mouth 3 (three) times daily between meals.     fluconazole 150  MG  tablet  Commonly known as:  DIFLUCAN  Take 1 tablet (150 mg total) by mouth once.     fluocinonide cream 0.05 %  Commonly known as:  LIDEX  Apply 1 application topically 2 (two) times daily as needed (rash).     GAS-X EXTRA STRENGTH PO  Take 1 tablet by mouth 3 (three) times daily.     ketoconazole 2 % shampoo  Commonly known as:  NIZORAL  Apply 1 application topically 2 (two) times a week.     metroNIDAZOLE 500 MG tablet  Commonly known as:  FLAGYL  Take 1 tablet (500 mg total) by mouth every 8 (eight) hours.     multivitamin tablet  Take 1 tablet by mouth daily.     omeprazole 40 MG capsule  Commonly known as:  PRILOSEC  Take 1 capsule (40 mg total) by mouth daily.     ondansetron 4 MG tablet  Commonly known as:  ZOFRAN  TAKE 1 TABLET (4 MG TOTAL) BY MOUTH EVERY 8 (EIGHT) HOURS AS NEEDED FOR NAUSEA OR VOMITING.     oxyCODONE-acetaminophen 5-325 MG per tablet  Commonly known as:  ROXICET  Take 1 tablet by mouth every 8 (eight) hours as needed for severe pain.     polyethylene glycol powder powder  Commonly known as:  GLYCOLAX/MIRALAX  Take 17 g by mouth daily.     potassium chloride SA 20 MEQ tablet  Commonly known as:  K-DUR,KLOR-CON  Take 1 tablet BID for 2 days, Take 1 tablet once a day for 4 days     predniSONE 10 MG tablet  Commonly known as:  DELTASONE  Take 3 tabs daily 3 days, then 2 tabs daily 3 days, then 1 tab daily 3 days, then stop.     PROBIOTIC DAILY PO  Take 1 tablet by mouth daily.     ranitidine 150 MG tablet  Commonly known as:  ZANTAC  Take 1 tablet (150 mg total) by mouth 2 (two) times daily.     saccharomyces boulardii 250 MG capsule  Commonly known as:  FLORASTOR  Take 1 capsule (250 mg total) by mouth 2 (two) times daily.     sucralfate 1 G tablet  Commonly known as:  CARAFATE  Take 1 tablet (1 g total) by mouth 4 (four) times daily -  with meals and at bedtime.     traMADol 50 MG tablet  Commonly known as:  ULTRAM  Take 1  tablet (50 mg total) by mouth every 6 (six) hours as needed.     Vitamin D (Ergocalciferol) 50000 UNITS Caps capsule  Commonly known as:  DRISDOL  Take 1 capsule (50,000 Units total) by mouth every 7 (seven) days.        Roxicet #30 No Refill  Disposition: Home  Patient's condition: is Good  Follow up: 1. Dr. Bridgett Larsson in 4 weeks   Cynthia Jock, PA-C [facility] [phone] 08/30/2014  9:27 AM   - For VQI Registry use --- Instructions: Press F2 to tab through selections.  Delete question if not applicable.   Post-op:  Time to Extubation: [ x] In OR, [ ]  < 12 hrs, [ ]  12-24 hrs, [ ]  >=24 hrs Vasopressors Req. Post-op: No MI: No., [ ]  Troponin only, [ ]  EKG or Clinical New Arrhythmia: No CHF: No Transfusion: No    Complications: Resp failure: No., [ ]  Pneumonia, [ ]  Ventilator Chg in renal function: No., [ ]  Inc. Cr > 0.5, [ ]  Temp.  Dialysis, [ ]  Permanent dialysis Leg ischemia: No., no Surgery needed, [ ]  Yes, Surgery needed, [ ]  Amputation Bowel ischemia: No., [ ]  Medical Rx, [ ]  Surgical Rx Wound complication: No., [ ]  Superficial separation/infection, [ ]  Return to OR Return to OR: No  Return to OR for bleeding: No Stroke: No., [ ]  Minor, [ ]  Major  Discharge medications: Statin use:  No If No: [ ]  For Medical reasons, [ ]  Non-compliant, [x ] Not-indicated ASA use:  No  If No: [x]  For Medical reasons (allergic), [ ]  Non-compliant, [ ]  Not-indicated Plavix use:  No If No: [ ]  For Medical reasons, [ ]  Non-compliant, [ x] Not-indicated Beta blocker use:  No If No: [ ]  For Medical reasons, [ ]  Non-compliant, [ x] Not-indicated

## 2014-09-02 ENCOUNTER — Telehealth: Payer: Self-pay

## 2014-09-02 DIAGNOSIS — R1909 Other intra-abdominal and pelvic swelling, mass and lump: Secondary | ICD-10-CM

## 2014-09-02 DIAGNOSIS — Z9889 Other specified postprocedural states: Secondary | ICD-10-CM

## 2014-09-02 NOTE — Telephone Encounter (Signed)
Phone call from pt.  Reported increased pain in right groin and left heel.  Stated her right groin region was recently hit when a cabinet drawer rolled into it on Mon., 5/9.  Stated there is increased swelling above and within the right groin incision.  Reported she has had increased pain, since the injury.  Denied any breakdown of the incision; denied any drainage.  Denied pulsatile mass upon palpation.  Denied fever/ chills.  Also reported her left heel ulcer is very tender.  Reported some redness surrounding the open sore.  Stated there has been an open area of the left heel, since prior to the 1st procedure, but now this has become larger in size, and more tender.  Described a dark center, and also calloused tissue over the general area.  Discussed pt's right groin injury and symptoms with Dr. Oneida Alar.  Recommended to schedule ultrasound of right groin to rule out pseudoaneurysm, and to see Dr. Bridgett Larsson tomorrow.  Pt. given appt. for 11:00 AM 5/13.  Agrees with plan.

## 2014-09-03 ENCOUNTER — Encounter (HOSPITAL_COMMUNITY): Payer: Self-pay | Admitting: *Deleted

## 2014-09-03 ENCOUNTER — Encounter: Payer: Self-pay | Admitting: Vascular Surgery

## 2014-09-03 ENCOUNTER — Ambulatory Visit (INDEPENDENT_AMBULATORY_CARE_PROVIDER_SITE_OTHER)
Admission: RE | Admit: 2014-09-03 | Discharge: 2014-09-03 | Disposition: A | Discharge status: Home / Self Care | Payer: Medicare Other | Source: Ambulatory Visit | Attending: Vascular Surgery | Admitting: Vascular Surgery

## 2014-09-03 ENCOUNTER — Other Ambulatory Visit: Payer: Self-pay

## 2014-09-03 ENCOUNTER — Ambulatory Visit (INDEPENDENT_AMBULATORY_CARE_PROVIDER_SITE_OTHER): Payer: Self-pay | Admitting: Vascular Surgery

## 2014-09-03 VITALS — BP 115/67 | HR 84 | Temp 97.1°F | Resp 14 | Ht 65.0 in | Wt 81.0 lb

## 2014-09-03 DIAGNOSIS — M797 Fibromyalgia: Secondary | ICD-10-CM | POA: Diagnosis present

## 2014-09-03 DIAGNOSIS — T82858A Stenosis of vascular prosthetic devices, implants and grafts, initial encounter: Principal | ICD-10-CM | POA: Diagnosis present

## 2014-09-03 DIAGNOSIS — Z5189 Encounter for other specified aftercare: Secondary | ICD-10-CM

## 2014-09-03 DIAGNOSIS — R1909 Other intra-abdominal and pelvic swelling, mass and lump: Secondary | ICD-10-CM

## 2014-09-03 DIAGNOSIS — Q251 Coarctation of aorta: Secondary | ICD-10-CM | POA: Insufficient documentation

## 2014-09-03 DIAGNOSIS — R1900 Intra-abdominal and pelvic swelling, mass and lump, unspecified site: Secondary | ICD-10-CM | POA: Insufficient documentation

## 2014-09-03 DIAGNOSIS — J449 Chronic obstructive pulmonary disease, unspecified: Secondary | ICD-10-CM | POA: Diagnosis present

## 2014-09-03 DIAGNOSIS — E43 Unspecified severe protein-calorie malnutrition: Secondary | ICD-10-CM | POA: Diagnosis present

## 2014-09-03 DIAGNOSIS — I70203 Unspecified atherosclerosis of native arteries of extremities, bilateral legs: Secondary | ICD-10-CM | POA: Diagnosis present

## 2014-09-03 DIAGNOSIS — I70219 Atherosclerosis of native arteries of extremities with intermittent claudication, unspecified extremity: Secondary | ICD-10-CM

## 2014-09-03 DIAGNOSIS — I7092 Chronic total occlusion of artery of the extremities: Secondary | ICD-10-CM | POA: Diagnosis present

## 2014-09-03 DIAGNOSIS — Q253 Supravalvular aortic stenosis: Secondary | ICD-10-CM

## 2014-09-03 DIAGNOSIS — M1990 Unspecified osteoarthritis, unspecified site: Secondary | ICD-10-CM | POA: Diagnosis present

## 2014-09-03 DIAGNOSIS — K219 Gastro-esophageal reflux disease without esophagitis: Secondary | ICD-10-CM | POA: Diagnosis present

## 2014-09-03 DIAGNOSIS — Z9889 Other specified postprocedural states: Secondary | ICD-10-CM

## 2014-09-03 DIAGNOSIS — M79605 Pain in left leg: Secondary | ICD-10-CM | POA: Diagnosis not present

## 2014-09-03 DIAGNOSIS — Z681 Body mass index (BMI) 19 or less, adult: Secondary | ICD-10-CM

## 2014-09-03 DIAGNOSIS — Z87891 Personal history of nicotine dependence: Secondary | ICD-10-CM

## 2014-09-03 NOTE — Progress Notes (Signed)
    Post-operative Aortic Stenting   History of Present Illness  Cynthia Bowen is a 69 y.o. female who presents cc: R groin pain bulge.  Pt underwent on 08/26/14: 1. Intravascular ultrasound 2. Endovascular repair of aorta using aorto-aortic tube prosthesis (16 mm x 14 mm x 7 cm) 3. Stenting and angioplasty left common iliac artery (balloon expandable uncovered stent 7 mm x 38 mm)  The patient recently hit her right groin on a drawer and then notice pain and swelling since then in the right groin.  The patient denies any change in her leg sx.  For VQI Use Only  PRE-ADM LIVING: Home  AMB STATUS: Ambulatory  Physical Examination  Filed Vitals:   09/03/14 1157  BP: 115/67  Pulse: 84  Temp: 97.1 F (36.2 C)  Resp: 14    Vascular:  B palpable femoral pulses, prominent R femoral pulse with swelling in R groin  Non-Invasive Vascular Imaging  R groin duplex (Date: 09/03/2014) No obvious PSA but extraluminal flow noted  Medical Decision Making  Cynthia Bowen is a 69 y.o. female who presents s/p Aortic stenting, L CIA stenting   Not certain to me exactly what happened with recent trauma to R groin, but duplex is somewhat concerning from leak from suture line.  Will plan on exploring R groin, likely R iliofemoral endarterectomy with bovine patch angioplasty, possible hematoma evacuation vs. Pseudoaneurysm repair.  The patient is aware risks include but are not limited to: bleeding, infection, nerve damage, need for additional procedures, embolization and thrombosis of branch vessels.  Adele Barthel, MD [facility] [place] Office: [phone] Pager: [phone]  09/03/2014, 2:32 PM

## 2014-09-04 ENCOUNTER — Inpatient Hospital Stay (HOSPITAL_COMMUNITY)
Admission: EM | Admit: 2014-09-04 | Discharge: 2014-09-09 | DRG: 252 | Disposition: A | Discharge status: Home Health | Payer: Medicare Other | Attending: Vascular Surgery | Admitting: Vascular Surgery

## 2014-09-04 ENCOUNTER — Emergency Department (HOSPITAL_COMMUNITY): Payer: Medicare Other

## 2014-09-04 ENCOUNTER — Emergency Department (HOSPITAL_COMMUNITY)
Admit: 2014-09-04 | Discharge: 2014-09-04 | Disposition: A | Discharge status: Home / Self Care | Payer: Medicare Other | Attending: Emergency Medicine | Admitting: Emergency Medicine

## 2014-09-04 ENCOUNTER — Emergency Department (HOSPITAL_BASED_OUTPATIENT_CLINIC_OR_DEPARTMENT_OTHER): Admit: 2014-09-04 | Discharge: 2014-09-04 | Disposition: A | Discharge status: Home / Self Care | Payer: Medicare Other

## 2014-09-04 ENCOUNTER — Emergency Department (HOSPITAL_COMMUNITY): Payer: Medicare Other | Admitting: Anesthesiology

## 2014-09-04 ENCOUNTER — Encounter (HOSPITAL_COMMUNITY): Payer: Self-pay | Admitting: *Deleted

## 2014-09-04 ENCOUNTER — Emergency Department (HOSPITAL_COMMUNITY): Admit: 2014-09-04 | Discharge: 2014-09-04 | Disposition: A | Discharge status: Home / Self Care | Payer: Medicare Other

## 2014-09-04 ENCOUNTER — Encounter (HOSPITAL_COMMUNITY)
Admission: EM | Disposition: A | Discharge status: Home Health | Payer: Self-pay | Source: Home / Self Care | Attending: Vascular Surgery

## 2014-09-04 DIAGNOSIS — I998 Other disorder of circulatory system: Secondary | ICD-10-CM

## 2014-09-04 DIAGNOSIS — I70209 Unspecified atherosclerosis of native arteries of extremities, unspecified extremity: Secondary | ICD-10-CM

## 2014-09-04 DIAGNOSIS — R109 Unspecified abdominal pain: Secondary | ICD-10-CM

## 2014-09-04 DIAGNOSIS — I739 Peripheral vascular disease, unspecified: Secondary | ICD-10-CM

## 2014-09-04 HISTORY — PX: FEMORAL-FEMORAL BYPASS GRAFT: SHX936

## 2014-09-04 LAB — BASIC METABOLIC PANEL
ANION GAP: 9 (ref 5–15)
BUN: 8 mg/dL (ref 6–20)
CHLORIDE: 107 mmol/L (ref 101–111)
CO2: 24 mmol/L (ref 22–32)
Calcium: 8.5 mg/dL — ABNORMAL LOW (ref 8.9–10.3)
Creatinine, Ser: 0.53 mg/dL (ref 0.44–1.00)
GFR calc non Af Amer: >60 mL/min (ref >60)
Glucose, Bld: 96 mg/dL (ref 65–99)
Potassium: 3.4 mmol/L — ABNORMAL LOW (ref 3.5–5.1)
Sodium: 140 mmol/L (ref 135–145)

## 2014-09-04 LAB — CBC
HCT: 27.5 % — ABNORMAL LOW (ref 36.0–46.0)
Hemoglobin: 9.3 g/dL — ABNORMAL LOW (ref 12.0–15.0)
MCH: 29.1 pg (ref 26.0–34.0)
MCHC: 33.8 g/dL (ref 30.0–36.0)
MCV: 85.9 fL (ref 78.0–100.0)
PLATELETS: 301 10*3/uL (ref 150–400)
RBC: 3.2 MIL/uL — AB (ref 3.87–5.11)
RDW: 16.2 % — ABNORMAL HIGH (ref 11.5–15.5)
WBC: 8.7 10*3/uL (ref 4.0–10.5)

## 2014-09-04 LAB — PREPARE RBC (CROSSMATCH)

## 2014-09-04 SURGERY — CREATION, BYPASS, ARTERIAL, FEMORAL TO FEMORAL, USING GRAFT
Anesthesia: General | Site: Groin | Laterality: Bilateral

## 2014-09-04 MED ORDER — FENTANYL CITRATE (PF) 100 MCG/2ML IJ SOLN
50.0000 ug | Freq: Once | INTRAMUSCULAR | Status: AC
  Administered 2014-09-04: 50 ug via INTRAVENOUS
  Filled 2014-09-04: qty 2

## 2014-09-04 MED ORDER — SUCCINYLCHOLINE CHLORIDE 20 MG/ML IJ SOLN
INTRAMUSCULAR | Status: AC
  Filled 2014-09-04: qty 1

## 2014-09-04 MED ORDER — PROPOFOL 10 MG/ML IV BOLUS
INTRAVENOUS | Status: AC
  Filled 2014-09-04: qty 20

## 2014-09-04 MED ORDER — MIDAZOLAM HCL 2 MG/2ML IJ SOLN
INTRAMUSCULAR | Status: DC | PRN
  Administered 2014-09-04: 2 mg via INTRAVENOUS

## 2014-09-04 MED ORDER — ALBUMIN HUMAN 5 % IV SOLN
INTRAVENOUS | Status: DC | PRN
  Administered 2014-09-04 (×2): via INTRAVENOUS

## 2014-09-04 MED ORDER — ONDANSETRON HCL 4 MG/2ML IJ SOLN: 4.0000 mg | Freq: Once | INTRAMUSCULAR | Status: DC

## 2014-09-04 MED ORDER — ONDANSETRON HCL 4 MG/2ML IJ SOLN
4.0000 mg | Freq: Once | INTRAMUSCULAR | Status: AC
  Administered 2014-09-04: 4 mg via INTRAVENOUS
  Filled 2014-09-04: qty 2

## 2014-09-04 MED ORDER — ROCURONIUM BROMIDE 50 MG/5ML IV SOLN
INTRAVENOUS | Status: AC
  Filled 2014-09-04: qty 1

## 2014-09-04 MED ORDER — LIDOCAINE HCL (CARDIAC) 20 MG/ML IV SOLN
INTRAVENOUS | Status: DC | PRN
  Administered 2014-09-04: 100 mg via INTRAVENOUS

## 2014-09-04 MED ORDER — HYDROMORPHONE HCL 1 MG/ML IJ SOLN: 1.0000 mg | Freq: Once | INTRAMUSCULAR | Status: DC

## 2014-09-04 MED ORDER — FENTANYL CITRATE (PF) 250 MCG/5ML IJ SOLN
INTRAMUSCULAR | Status: AC
  Filled 2014-09-04: qty 5

## 2014-09-04 MED ORDER — LACTATED RINGERS IV SOLN
INTRAVENOUS | Status: DC | PRN
Start: 2014-09-04 — End: 2014-09-05
  Administered 2014-09-04: 22:00:00 via INTRAVENOUS

## 2014-09-04 MED ORDER — SUCCINYLCHOLINE CHLORIDE 20 MG/ML IJ SOLN
INTRAMUSCULAR | Status: DC | PRN
  Administered 2014-09-04: 100 mg via INTRAVENOUS

## 2014-09-04 MED ORDER — PROPOFOL 10 MG/ML IV BOLUS
INTRAVENOUS | Status: DC | PRN
  Administered 2014-09-04: 100 mg via INTRAVENOUS

## 2014-09-04 MED ORDER — SODIUM CHLORIDE 0.9 % IR SOLN
Status: DC | PRN
  Administered 2014-09-04: 500 mL

## 2014-09-04 MED ORDER — FENTANYL CITRATE (PF) 250 MCG/5ML IJ SOLN
INTRAMUSCULAR | Status: DC | PRN
Start: 2014-09-04 — End: 2014-09-05
  Administered 2014-09-04 (×2): 100 ug via INTRAVENOUS
  Administered 2014-09-04 (×3): 50 ug via INTRAVENOUS

## 2014-09-04 MED ORDER — SODIUM CHLORIDE 0.9 % IV SOLN: 10.0000 mL/h | Freq: Once | INTRAVENOUS | Status: DC

## 2014-09-04 MED ORDER — IOHEXOL 350 MG/ML SOLN
80.0000 mL | Freq: Once | INTRAVENOUS | Status: AC | PRN
  Administered 2014-09-04: 80 mL via INTRAVENOUS

## 2014-09-04 MED ORDER — EPHEDRINE SULFATE 50 MG/ML IJ SOLN
INTRAMUSCULAR | Status: AC
  Filled 2014-09-04: qty 1

## 2014-09-04 MED ORDER — LIDOCAINE HCL (CARDIAC) 20 MG/ML IV SOLN
INTRAVENOUS | Status: AC
  Filled 2014-09-04: qty 5

## 2014-09-04 MED ORDER — FENTANYL CITRATE (PF) 250 MCG/5ML IJ SOLN
INTRAMUSCULAR | Status: AC
Start: 2014-09-04 — End: 2014-09-04
  Filled 2014-09-04: qty 5

## 2014-09-04 MED ORDER — ROCURONIUM BROMIDE 100 MG/10ML IV SOLN
INTRAVENOUS | Status: DC | PRN
  Administered 2014-09-04: 30 mg via INTRAVENOUS

## 2014-09-04 MED ORDER — CEFAZOLIN SODIUM 1-5 GM-% IV SOLN
INTRAVENOUS | Status: AC
  Administered 2014-09-04: 1 g via INTRAVENOUS
  Filled 2014-09-04: qty 50

## 2014-09-04 MED ORDER — HEPARIN SODIUM (PORCINE) 1000 UNIT/ML IJ SOLN
INTRAMUSCULAR | Status: DC | PRN
  Administered 2014-09-04: 6000 [IU] via INTRAVENOUS

## 2014-09-04 MED ORDER — MIDAZOLAM HCL 2 MG/2ML IJ SOLN
INTRAMUSCULAR | Status: AC
  Filled 2014-09-04: qty 2

## 2014-09-04 MED ORDER — SODIUM CHLORIDE 0.9 % IJ SOLN
INTRAMUSCULAR | Status: AC
  Filled 2014-09-04: qty 10

## 2014-09-04 MED ORDER — 0.9 % SODIUM CHLORIDE (POUR BTL) OPTIME
TOPICAL | Status: DC | PRN
  Administered 2014-09-04: 1000 mL

## 2014-09-04 SURGICAL SUPPLY — 43 items
BAG ISOLATION DRAPE 18X18 (DRAPES) ×1 IMPLANT
BENZOIN TINCTURE PRP APPL 2/3 (GAUZE/BANDAGES/DRESSINGS) ×3 IMPLANT
CANISTER SUCTION 2500CC (MISCELLANEOUS) ×3 IMPLANT
CANNULA VESSEL 3MM 2 BLNT TIP (CANNULA) ×6 IMPLANT
CLIP LIGATING EXTRA MED SLVR (CLIP) ×3 IMPLANT
CLIP LIGATING EXTRA SM BLUE (MISCELLANEOUS) ×3 IMPLANT
CLOSURE WOUND 1/2 X4 (GAUZE/BANDAGES/DRESSINGS) ×1
DRAIN SNY 10X20 3/4 PERF (WOUND CARE) IMPLANT
DRAPE ISOLATION BAG 18X18 (DRAPES) ×2
DRSG COVADERM 4X10 (GAUZE/BANDAGES/DRESSINGS) IMPLANT
ELECT REM PT RETURN 9FT ADLT (ELECTROSURGICAL) ×3
ELECTRODE REM PT RTRN 9FT ADLT (ELECTROSURGICAL) ×1 IMPLANT
EVACUATOR SILICONE 100CC (DRAIN) IMPLANT
GLOVE BIOGEL PI IND STRL 6.5 (GLOVE) ×6 IMPLANT
GLOVE BIOGEL PI INDICATOR 6.5 (GLOVE) ×12
GLOVE SS BIOGEL STRL SZ 7.5 (GLOVE) ×1 IMPLANT
GLOVE SUPERSENSE BIOGEL SZ 7.5 (GLOVE) ×2
GOWN STRL REUS W/ TWL LRG LVL3 (GOWN DISPOSABLE) ×3 IMPLANT
GOWN STRL REUS W/TWL LRG LVL3 (GOWN DISPOSABLE) ×6
GRAFT HEMASHIELD 8MM (Vascular Products) ×2 IMPLANT
GRAFT VASC STRG 30X8KNIT (Vascular Products) ×1 IMPLANT
INSERT FOGARTY SM (MISCELLANEOUS) ×3 IMPLANT
KIT BASIN OR (CUSTOM PROCEDURE TRAY) ×3 IMPLANT
KIT ROOM TURNOVER OR (KITS) ×3 IMPLANT
LIQUID BAND (GAUZE/BANDAGES/DRESSINGS) ×3 IMPLANT
NS IRRIG 1000ML POUR BTL (IV SOLUTION) ×6 IMPLANT
PACK PERIPHERAL VASCULAR (CUSTOM PROCEDURE TRAY) ×3 IMPLANT
PAD ARMBOARD 7.5X6 YLW CONV (MISCELLANEOUS) ×6 IMPLANT
PATCH HEMASHIELD 8X75 (Vascular Products) ×3 IMPLANT
STAPLER VISISTAT 35W (STAPLE) IMPLANT
STRIP CLOSURE SKIN 1/2X4 (GAUZE/BANDAGES/DRESSINGS) ×2 IMPLANT
SUT ETHILON 3 0 PS 1 (SUTURE) IMPLANT
SUT PROLENE 5 0 C 1 24 (SUTURE) ×6 IMPLANT
SUT PROLENE 6 0 CC (SUTURE) ×12 IMPLANT
SUT SILK 2 0 FS (SUTURE) ×3 IMPLANT
SUT SILK 2 0 SH (SUTURE) IMPLANT
SUT VIC AB 2-0 CTX 36 (SUTURE) ×6 IMPLANT
SUT VIC AB 3-0 SH 27 (SUTURE) ×4
SUT VIC AB 3-0 SH 27X BRD (SUTURE) ×2 IMPLANT
TRAY FOLEY BAG SILVER LF 16FR (SET/KITS/TRAYS/PACK) ×3 IMPLANT
TRAY FOLEY CATH 16FRSI W/METER (SET/KITS/TRAYS/PACK) IMPLANT
UNDERPAD 30X30 INCONTINENT (UNDERPADS AND DIAPERS) ×3 IMPLANT
WATER STERILE IRR 1000ML POUR (IV SOLUTION) ×3 IMPLANT

## 2014-09-04 NOTE — ED Provider Notes (Signed)
CSN: [id number]     Arrival date & time 09/04/14  1522 History   First MD Initiated Contact with Patient 09/04/14 1648     Chief Complaint  Patient presents with  . Post-op Problem     (Consider location/radiation/quality/duration/timing/severity/associated sxs/prior Treatment) HPI  Pt presenting with c/o pain in bilateral lower extremities, left greater than right.  Pt had aortic endograft and left iliac stenting earlier this month.  Pt was seen yesterday by vascular, Dr. Bridgett Larsson and had right groin pain- ultrasound there showed possible disruption of the suture line- she was scheduled for OR in 2 days to explore this.  Pt states today she had pain in left lower extremity that is severe- from groin to toes.  She states she foot feels "like a block of ice".  Pain continues in right groin.  No abdominal pain.  No fever/chills.  There are no other associated systemic symptoms, there are no other alleviating or modifying factors.   Past Medical History  Diagnosis Date  . COPD (chronic obstructive pulmonary disease)   . Pneumonia 12-2011  . GERD (gastroesophageal reflux disease)   . Headache(784.0)   . Arthritis     osteoarthritis  . Allergy   . Depression   . Neuromuscular disorder   . Osteoporosis   . Bronchitis     CURRENTLY AS OF 06/30/12 - HAS COUGH AND FINISHED ANTIBIOTIC FOR BRONCHITIS  . Fibromyalgia   . Pain     ABDOMINAL PAIN AND NAUSEA  . Pain     SOMETIMES PAIN RIGHT EAR AND NECK--STATES CAUSED BY A "LUMP" ON BACK OF EAR--USES KENALOG CREAM TOPICALLY AS NEEDED.  Marland Kitchen Gastrocutaneous fistula   . Anemia   . Anxiety   . Blood transfusion without reported diagnosis   . Heart murmur     young  . Peripheral vascular disease     hx  ?leg  . History of kidney stones    Past Surgical History  Procedure Laterality Date  . Abdominal hysterectomy    . Esophagogastroduodenoscopy  04/18/2012    Procedure: ESOPHAGOGASTRODUODENOSCOPY (EGD);  Surgeon: Inda Castle, MD;  Location: [facility];  Service: Endoscopy;  Laterality: N/A;  . Eus  05/29/2012    Procedure: UPPER ENDOSCOPIC ULTRASOUND (EUS) LINEAR;  Surgeon: Milus Banister, MD;  Location: [facility];  Service: Endoscopy;  Laterality: N/A;  . Appendectomy    . Spine surgery      CERVICAL SPINE SURGERY X 2 - INCLUDING FUSION; LUMBAR SURGERY FOR RUPTURED DISC  . Eye surgery      BILATERAL CATARACT EXTRACTIONS  . Laparoscopic abdominal exploration N/A 07/02/2012    Procedure: converted to laparotomy with gastric biopsy;  Surgeon: Imogene Burn. Georgette Dover, MD;  Location: [facility];  Service: General;  Laterality: N/A;  Laparoscopic Gastric Biospy attempted.   . Laparotomy N/A 07/07/2012    Procedure: EXPLORATORY LAPAROTOMY repair of gastric perforation with omental graham patch, drainage of abdominal abcess;  Surgeon: Imogene Burn. Georgette Dover, MD;  Location: [facility];  Service: General;  Laterality: N/A;  . Stomach surgery  07/07/2012    Omental patch of gastric perforation  . Laparotomy N/A 07/13/2012    Procedure: EXPLORATORY LAPAROTOMY repair gastric leak;  Surgeon: Edward Jolly, MD;  Location: [facility];  Service: General;  Laterality: N/A;  . Laparotomy N/A 11/11/2012    Procedure: EXPLORATORY LAPAROTOMY;  Surgeon: Imogene Burn. Georgette Dover, MD;  Location: [place];  Service: General;  Laterality: N/A;  . Bowel resection N/A 11/11/2012  Procedure: SMALL BOWEL RESECTION;  Surgeon: Imogene Burn. Georgette Dover, MD;  Location: [place];  Service: General;  Laterality: N/A;  . Minor application of wound vac N/A 11/11/2012    Procedure: APPLICATION OF WOUND VAC;  Surgeon: Imogene Burn. Georgette Dover, MD;  Location: [place];  Service: General;  Laterality: N/A;  . Jejunostomy N/A 11/11/2012    Procedure: PLACEMENT OF FEEDING JEJUNOSTOMY TUBE;  Surgeon: Imogene Burn. Georgette Dover, MD;  Location: [place];  Service: General;  Laterality: N/A;  . Lysis of adhesion N/A 11/11/2012    Procedure: LYSIS OF ADHESION;  Surgeon: Imogene Burn. Georgette Dover, MD;  Location: [place];  Service: General;  Laterality:  N/A;  . Hernia repair    . Gastric fistula repair      05/16/2013  . Colonoscopy    . Polypectomy    . Upper gastrointestinal endoscopy    . Abdominal aortagram N/A 08/19/2014    Procedure: ABDOMINAL Maxcine Ham;  Surgeon: Conrad Mokane, MD;  Location: [facility] CATH LAB;  Service: Cardiovascular;  Laterality: N/A;  . Abdominal aortic endovascular stent graft N/A 08/26/2014    Procedure: AORTIC ENDOVASCULAR  COVERED STENT ;  Surgeon: Conrad Dubach, MD;  Location: [facility] OR;  Service: Vascular;  Laterality: N/A;  IVUS   Family History  Problem Relation Age of Onset  . COPD Mother   . Hyperlipidemia Mother   . Hypertension Maternal Grandmother   . Stroke Maternal Grandmother   . Colon cancer Neg Hx    History  Substance Use Topics  . Smoking status: Former Smoker -- 0.50 packs/day for 35 years    Types: Cigarettes    Start date: 01/04/2003    Quit date: 04/17/2005  . Smokeless tobacco: Never Used  . Alcohol Use: No   OB History    No data available     Review of Systems  ROS reviewed and all otherwise negative except for mentioned in HPI    Allergies  Avelox; Betadine; Alendronate sodium; Aspirin; Codeine; Doxycycline; Fluconazole; Hydrocodone; Hydrocodone-acetaminophen; Morphine and related; Neurontin; Nsaids; Quinolones; Sertraline hcl; Sulfamethoxazole; Latex; and Sulfa antibiotics  Home Medications   Prior to Admission medications   Medication Sig Start Date End Date Taking? Authorizing Provider  acetaminophen (TYLENOL) 500 MG tablet Take 1,000 mg by mouth daily as needed (pain).   Yes Historical Provider, MD  CALCIUM PO Take 1 tablet by mouth daily.   Yes Historical Provider, MD  Carboxymeth-Glycerin-Polysorb 0.5-1-0.5 % SOLN Place 2 drops into both eyes 3 (three) times daily.   Yes Historical Provider, MD  dicyclomine (BENTYL) 20 MG tablet Take 1 tablet (20 mg total) by mouth 4 (four) times daily -  before meals and at bedtime. 08/03/14  Yes [name] Koyanagi, MD  diphenhydrAMINE  (BENADRYL) 12.5 MG/5ML liquid Take 25 mg by mouth 4 (four) times daily as needed for itching or allergies.    Yes Historical Provider, MD  EPINEPHrine (EPIPEN) 0.3 mg/0.3 mL IJ SOAJ injection Inject 0.3 mLs (0.3 mg total) into the muscle daily as needed (anaphylaxis). Patient taking differently: Inject 0.3 mg into the muscle as needed (allergic reaction).  10/07/13  Yes [name] Koyanagi, MD  feeding supplement, RESOURCE BREEZE, (RESOURCE BREEZE) LIQD Take 1 Container by mouth 3 (three) times daily between meals. 06/05/14  Yes Modena Jansky, MD  fluocinonide cream (LIDEX) 3.15 % Apply 1 application topically 2 (two) times daily as needed (rash). Patient taking differently: Apply 1 application topically 2 (two) times daily as needed (eczema).  06/05/14  Yes Everlene Farrier  D Hongalgi, MD  ketoconazole (NIZORAL) 2 % shampoo Apply 1 application topically 2 (two) times a week. Patient taking differently: Apply 1 application topically See admin instructions. Use twice a week as needed for eczema 10/28/13  Yes [name] Koyanagi, MD  ketotifen (ZADITOR) 0.025 % ophthalmic solution Place 1 drop into both eyes 2 (two) times daily.   Yes Historical Provider, MD  Multiple Vitamin (MULTIVITAMIN WITH MINERALS) TABS tablet Take 1 tablet by mouth daily.   Yes Historical Provider, MD  ondansetron (ZOFRAN) 4 MG tablet TAKE 1 TABLET (4 MG TOTAL) BY MOUTH EVERY 8 (EIGHT) HOURS AS NEEDED FOR NAUSEA OR VOMITING. Patient taking differently: Take 4 mg by mouth every 8 (eight) hours as needed for nausea or vomiting.  07/01/14  Yes [name] Koyanagi, MD  oxyCODONE-acetaminophen (ROXICET) 5-325 MG per tablet Take 1 tablet by mouth every 8 (eight) hours as needed for severe pain. 08/27/14  Yes Samantha J Rhyne, PA-C  polyethylene glycol powder (GLYCOLAX/MIRALAX) powder Take 17 g by mouth daily. Patient taking differently: Take 17 g by mouth at bedtime. Mix in 8 oz of water and drink 07/01/14  Yes [name] Koyanagi, MD  Probiotic Product  (PROBIOTIC DAILY PO) Take 1 tablet by mouth daily.    Yes Historical Provider, MD  ranitidine (ZANTAC) 150 MG tablet Take 1 tablet (150 mg total) by mouth 2 (two) times daily. 07/01/14  Yes [name] Koyanagi, MD  Simethicone (GAS-X EXTRA STRENGTH PO) Take 1 tablet by mouth 3 (three) times daily after meals.    Yes Historical Provider, MD  Vitamin D, Ergocalciferol, (DRISDOL) 50000 UNITS CAPS capsule Take 1 capsule (50,000 Units total) by mouth every 7 (seven) days. Patient taking differently: Take 50,000 Units by mouth every 7 (seven) days. On Saturday 06/29/14  Yes Robyn Haber, MD  albuterol (PROVENTIL HFA;VENTOLIN HFA) 108 (90 BASE) MCG/ACT inhaler Inhale 2 puffs into the lungs every 6 (six) hours as needed for wheezing. Patient not taking: Reported on 09/04/2014 11/17/13   [name] Koyanagi, MD  clidinium-chlordiazePOXIDE (Muddy) 5-2.5 MG per capsule TAKE ONE CAPSULE BY MOUTH 4 TIMES A DAY BEFORE MEALS AND AT BEDTIME, AS NEEDED FOR ABDOMINAL PAIN Patient not taking: Reported on 09/04/2014 08/03/14   [name] Koyanagi, MD  fluconazole (DIFLUCAN) 150 MG tablet Take 1 tablet (150 mg total) by mouth once. Patient not taking: Reported on 09/04/2014 06/16/14   [name] Koyanagi, MD  metroNIDAZOLE (FLAGYL) 500 MG tablet Take 1 tablet (500 mg total) by mouth every 8 (eight) hours. Patient not taking: Reported on 09/04/2014 06/05/14   Modena Jansky, MD  omeprazole (PRILOSEC) 40 MG capsule Take 1 capsule (40 mg total) by mouth daily. Patient not taking: Reported on 09/04/2014 05/26/14   Posey Boyer, MD  potassium chloride SA (K-DUR,KLOR-CON) 20 MEQ tablet Take 1 tablet BID for 2 days, Take 1 tablet once a day for 4 days Patient not taking: Reported on 09/04/2014 05/26/14   Daphane Shepherd, PA-C  predniSONE (DELTASONE) 10 MG tablet Take 3 tabs daily 3 days, then 2 tabs daily 3 days, then 1 tab daily 3 days, then stop. Patient not taking: Reported on 09/04/2014 06/05/14   Modena Jansky, MD  saccharomyces  boulardii (FLORASTOR) 250 MG capsule Take 1 capsule (250 mg total) by mouth 2 (two) times daily. Patient not taking: Reported on 09/04/2014 06/05/14   Modena Jansky, MD  sucralfate (CARAFATE) 1 G tablet Take 1 tablet (1 g total) by mouth 4 (four) times daily -  with meals and at bedtime. Patient not taking: Reported on 09/04/2014 06/25/14   [name] Koyanagi, MD  traMADol (ULTRAM) 50 MG tablet Take 1 tablet (50 mg total) by mouth every 6 (six) hours as needed. Patient not taking: Reported on 09/04/2014 08/19/14   Conrad [place], MD   BP 154/74 mmHg  Pulse 78  Temp(Src) 98.4 F (36.9 C) (Oral)  Resp 16  Ht 5\' 5"  (1.651 m)  Wt 82 lb 10.8 oz (37.5 kg)  BMI 13.76 kg/m2  SpO2 100%  Vitals reviewed Physical Exam  Physical Examination: General appearance - alert, well appearing, and in no distress Mental status - alert, oriented to person, place, and time Eyes - no conjunctival injection no scleral icterus Chest - clear to auscultation, no wheezes, rales or rhonchi, symmetric air entry Heart - normal rate, regular rhythm, normal S1, S2, no murmurs, rubs, clicks or gallops Abdomen - soft, nontender, nondistended, no masses or organomegaly Neurological - alert, oriented x 3, strength 5/5 in extremities x 4, sensation intact to light touch in lower extremities Musculoskeletal - no joint tenderness, deformity or swelling Extremities - peripheral pulses not palpable- pt states they always need to be dopplered, toes bilateral are cold to touch, brisk cap refilll, no pedal edema, no clubbing or cyanosis Skin - normal coloration and turgor, no rashes  ED Course  Procedures (including critical care time)  7:41 PM d/w doppler tech- ABI was not able to be obtained, she does not hear any flow in left lower extremity (pt stated pulses are never palpable and need to be dopplered)  - d/w Dr. Donnetta Hutching, he has reviewed chart- request CT angio of abdomen/pelvis- he will see patient in the ED.    8:56 PM pt is now  in CT scan  9:17 PM Dr. Donnetta Hutching is here to see patient, he plans to take her to the OR.   Labs Review Labs Reviewed  CBC - Abnormal; Notable for the following:    RBC 3.20 (*)    Hemoglobin 9.3 (*)    HCT 27.5 (*)    RDW 16.2 (*)    All other components within normal limits  BASIC METABOLIC PANEL - Abnormal; Notable for the following:    Potassium 3.4 (*)    Calcium 8.5 (*)    All other components within normal limits  POCT I-STAT 4, (NA,K, GLUC, HGB,HCT) - Abnormal; Notable for the following:    Glucose, Bld 106 (*)    HCT 25.0 (*)    Hemoglobin 8.5 (*)    All other components within normal limits  CBC  BASIC METABOLIC PANEL  TYPE AND SCREEN  PREPARE RBC (CROSSMATCH)  PREPARE RBC (CROSSMATCH)    Imaging Review Ct Angio Abd/pel W/ And/or W/o  09/04/2014   CLINICAL DATA:  Worsening hematoma at region of previous right groin stent placement as discharge 08/28/2014.  EXAM: CTA ABDOMEN AND PELVIS wITHOUT AND WITH CONTRAST  TECHNIQUE: Multidetector CT imaging of the abdomen and pelvis was performed using the standard protocol during bolus administration of intravenous contrast. Multiplanar reconstructed images and MIPs were obtained and reviewed to evaluate the vascular anatomy.  CONTRAST:  60mL OMNIPAQUE IOHEXOL 350 MG/ML SOLN  COMPARISON:  01/29/2014  FINDINGS: Lung bases demonstrate minimal left lower lobe atelectasis/scarring. There is mild cardiomegaly with a small to moderate pericardial effusion without significant change.  There is calcified plaque over the abdominal aorta. The celiac axis and superior mesenteric arteries are patent. There is mild plaque at the origin of  the superior mesenteric artery. There is moderate calcified plaque at the origin of the right renal artery as the renal arteries are otherwise patent. There is a small accessory left renal artery just above the native renal artery. Inferior mesenteric artery is not visualized. Aortic iliac stent graft begins just below  the renal arteries extending to the common iliac arteries. There is complete occlusion at the origin of the left common iliac artery with reconstitution at the iliac bifurcation as this is a new finding.  The left external and internal iliac arteries are patent. Left femoral artery becomes occluded at the bifurcation as the profunda femoral artery remains widely patent. The right common as well as internal and external iliac arteries are patent. There is complete occlusion of the right femoral artery at its origin for approximately 3.5 cm before there is reconstitution via collaterals for a short segment before there is complete occlusion again. The right profunda femoral artery is patent. These findings are unchanged. There is a small hematoma superficial to the right femoral vessels measuring 1.6 x 1.9 cm in AP and transverse dimension.  Abdominal images demonstrate postsurgical changes over the stomach likely related to gastric bypass surgery. The liver, spleen, pancreas, gallbladder and adrenal glands are unremarkable. Kidneys are normal in size and demonstrate bilateral simple cysts in a few small subcentimeter renal cortical hypodensities too small to characterize but likely cysts. Bilateral nephrolithiasis is present. There is no hydronephrosis. There are postsurgical changes over the anterior abdominal wall. No significant free fluid.  Pelvic images demonstrate the bladder and rectum to be within normal.  Degenerative changes of the spine with severe disc disease at the L4-5 level. Mild degenerate change of the hips.  Review of the MIP images confirms the above findings.  IMPRESSION: No evidence of aortic aneurysm or dissection. Evidence of patient's aortic iliac stent graft. 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 Small right groin hematoma superficial to  the femoral vessels measuring 1.6 x 1.9 cm.  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 Patent bilateral profunda femoral arteries.  Bilateral nephrolithiasis and bilateral renal cysts unchanged.  Mild cardiomegaly with small to moderate pericardial effusion unchanged.  Evidence of prior gastric surgery.   Electronically Signed   By: Marin Olp M.D.   On: 09/04/2014 22:00     EKG Interpretation None      MDM   Final diagnoses:  PVD (peripheral vascular disease)  Abdominal pain    Pt presenting with c/o increased pain in left lower extrmety and continued right groin pain after vascular stenting several weeks ago.  ABI not able to be checked on left lower extremity as flow was inaudible.  D/w Dr. Donnetta Hutching- he has seen patient in the ED and requests CT angio which was ordered- he plans to take patient to the OR.  Pt pain treated with fentanyl successfully.      Alfonzo Beers, MD 09/05/14 [phone]

## 2014-09-04 NOTE — ED Notes (Signed)
Pt is for surgery on Monday for repair of leakage located in Rt groin.

## 2014-09-04 NOTE — ED Notes (Signed)
Pt reports had a stent placed in Rt groin ans was D/C on 08-28-14. Pt reports hitting the incision site on WED. Pt now has a hematoma at incision site . Pt was seen at vascular office on 09-03-14 and instructed to go to the hospital if worse. Pt  Now reports Hematoma located at RT groin is larger and new bruising located in Lt groin . Pt also reports new Lt leg pain. Lt leg pain involves whole leg. Pt reports the pulses in legs need a doppler the patient has full range of motion of BLE.

## 2014-09-04 NOTE — Anesthesia Preprocedure Evaluation (Signed)
Anesthesia Evaluation  Patient identified by MRN, date of birth, ID band Patient awake    Reviewed: Allergy & Precautions, NPO status , Patient's Chart, lab work & pertinent test results  History of Anesthesia Complications Negative for: history of anesthetic complications  Airway Mallampati: II  TM Distance: >3 FB Neck ROM: Full    Dental  (+) Edentulous Upper, Edentulous Lower, Dental Advisory Given   Pulmonary COPDformer smoker,    Pulmonary exam normal       Cardiovascular + Peripheral Vascular Disease Normal cardiovascular exam    Neuro/Psych PSYCHIATRIC DISORDERS Anxiety Depression negative neurological ROS     GI/Hepatic Neg liver ROS, GERD-  ,  Endo/Other    Renal/GU negative Renal ROS     Musculoskeletal   Abdominal   Peds  Hematology negative hematology ROS (+)   Anesthesia Other Findings   Reproductive/Obstetrics                             Anesthesia Physical Anesthesia Plan  ASA: III and emergent  Anesthesia Plan: General   Post-op Pain Management:    Induction: Intravenous, Rapid sequence and Cricoid pressure planned  Airway Management Planned: Oral ETT  Additional Equipment:   Intra-op Plan:   Post-operative Plan: Extubation in OR  Informed Consent: I have reviewed the patients History and Physical, chart, labs and discussed the procedure including the risks, benefits and alternatives for the proposed anesthesia with the patient or authorized representative who has indicated his/her understanding and acceptance.   Dental advisory given  Plan Discussed with: CRNA, Anesthesiologist and Surgeon  Anesthesia Plan Comments:         Anesthesia Quick Evaluation

## 2014-09-04 NOTE — H&P (Signed)
Patient name: Cynthia Bowen MRN: [id number] DOB: 1946-03-01 Sex: female   Referred by: EDP  Reason for referral:  Chief Complaint  Patient presents with  . Post-op Problem    HISTORY OF PRESENT ILLNESS: The patient presents to the emergency room this evening with bilateral lower extremity discomfort. She has a complex recent past history regarding her arterial flow. She had severe lower extremity ischemia underwent diagnostic arteriogram at the end of April 2016. 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 She underwent covered stenting of this on 08/26/2014 with Dr. Bridgett Larsson.  She had a cutdown to the right groin and percutaneous access to the left groin. She had a left common iliac artery stent laced the same procedure. She was seen yesterday in the office with concern regarding her right groin. She had developed discomfort there and on duplex had unusual flow with questionable suture line disruption. She was scheduled for exploration of this on Monday, 09/06/2014. She presented to the emergency room this evening with left greater than right leg discomfort. She was found to have an audible flow on the left.  Past Medical History  Diagnosis Date  . COPD (chronic obstructive pulmonary disease)   . Pneumonia 12-2011  . GERD (gastroesophageal reflux disease)   . Headache(784.0)   . Arthritis     osteoarthritis  . Allergy   . Depression   . Neuromuscular disorder   . Osteoporosis   . Bronchitis     CURRENTLY AS OF 06/30/12 - HAS COUGH AND FINISHED ANTIBIOTIC FOR BRONCHITIS  . Fibromyalgia   . Pain     ABDOMINAL PAIN AND NAUSEA  . Pain     SOMETIMES PAIN RIGHT EAR AND NECK--STATES CAUSED BY A "LUMP" ON BACK OF EAR--USES KENALOG CREAM TOPICALLY AS NEEDED.  Marland Kitchen Gastrocutaneous fistula   . Anemia   . Anxiety   . Blood transfusion without reported diagnosis   . Heart murmur     young  . Peripheral vascular disease     hx  ?leg  . History of kidney stones      Past Surgical History  Procedure Laterality Date  . Abdominal hysterectomy    . Esophagogastroduodenoscopy  04/18/2012    Procedure: ESOPHAGOGASTRODUODENOSCOPY (EGD);  Surgeon: Inda Castle, MD;  Location: [facility];  Service: Endoscopy;  Laterality: N/A;  . Eus  05/29/2012    Procedure: UPPER ENDOSCOPIC ULTRASOUND (EUS) LINEAR;  Surgeon: Milus Banister, MD;  Location: [facility];  Service: Endoscopy;  Laterality: N/A;  . Appendectomy    . Spine surgery      CERVICAL SPINE SURGERY X 2 - INCLUDING FUSION; LUMBAR SURGERY FOR RUPTURED DISC  . Eye surgery      BILATERAL CATARACT EXTRACTIONS  . Laparoscopic abdominal exploration N/A 07/02/2012    Procedure: converted to laparotomy with gastric biopsy;  Surgeon: Imogene Burn. Georgette Dover, MD;  Location: [facility];  Service: General;  Laterality: N/A;  Laparoscopic Gastric Biospy attempted.   . Laparotomy N/A 07/07/2012    Procedure: EXPLORATORY LAPAROTOMY repair of gastric perforation with omental graham patch, drainage of abdominal abcess;  Surgeon: Imogene Burn. Georgette Dover, MD;  Location: [facility];  Service: General;  Laterality: N/A;  . Stomach surgery  07/07/2012    Omental patch of gastric perforation  . Laparotomy N/A 07/13/2012    Procedure: EXPLORATORY LAPAROTOMY repair gastric leak;  Surgeon: Edward Jolly, MD;  Location: [facility];  Service: General;  Laterality: N/A;  . Laparotomy N/A 11/11/2012  Procedure: EXPLORATORY LAPAROTOMY;  Surgeon: Imogene Burn. Georgette Dover, MD;  Location: [place];  Service: General;  Laterality: N/A;  . Bowel resection N/A 11/11/2012    Procedure: SMALL BOWEL RESECTION;  Surgeon: Imogene Burn. Georgette Dover, MD;  Location: [place];  Service: General;  Laterality: N/A;  . Minor application of wound vac N/A 11/11/2012    Procedure: APPLICATION OF WOUND VAC;  Surgeon: Imogene Burn. Georgette Dover, MD;  Location: [place];  Service: General;  Laterality: N/A;  . Jejunostomy N/A 11/11/2012    Procedure: PLACEMENT OF FEEDING JEJUNOSTOMY TUBE;  Surgeon: Imogene Burn.  Georgette Dover, MD;  Location: [place];  Service: General;  Laterality: N/A;  . Lysis of adhesion N/A 11/11/2012    Procedure: LYSIS OF ADHESION;  Surgeon: Imogene Burn. Georgette Dover, MD;  Location: [place];  Service: General;  Laterality: N/A;  . Hernia repair    . Gastric fistula repair      05/16/2013  . Colonoscopy    . Polypectomy    . Upper gastrointestinal endoscopy    . Abdominal aortagram N/A 08/19/2014    Procedure: ABDOMINAL Maxcine Ham;  Surgeon: Conrad Lake Buckhorn, MD;  Location: [facility] C[facility];  Service: Cardiovascular;  Laterality: N/A;  . Abdominal aortic endovascular stent graft N/A 08/26/2014    Procedure: AORTIC ENDOVASCULAR  COVERED STENT ;  Surgeon: Conrad [place] Falls, MD;  Location: [place];  Service: Vascular;  Laterality: N/A;  IVUS    History   Social History  . Marital Status: Single    Spouse Name: N/A  . Number of Children: 0  . Years of Education: N/A   Occupational History  .     Social History Main Topics  . Smoking status: Former Smoker -- 0.50 packs/day for 35 years    Types: Cigarettes    Start date: 01/04/2003    Quit date: 04/17/2005  . Smokeless tobacco: Never Used  . Alcohol Use: No  . Drug Use: No  . Sexual Activity: No   Other Topics Concern  . Not on file   Social History Narrative   Single. Education: [employer]. Exercise.    Family History  Problem Relation Age of Onset  . COPD Mother   . Hyperlipidemia Mother   . Hypertension Maternal Grandmother   . Stroke Maternal Grandmother   . Colon cancer Neg Hx     Allergies as of 09/04/2014 - Review Complete 09/04/2014  Allergen Reaction Noted  . Avelox [moxifloxacin hcl in nacl] Nausea And Vomiting 01/04/2012  . Betadine [povidone iodine] Itching and Rash 09/07/2012  . Alendronate sodium Nausea And Vomiting and Other (See Comments) 06/12/2011  . Aspirin Nausea Only 06/12/2011  . Codeine Nausea And Vomiting 06/12/2011  . Doxycycline Nausea And Vomiting 06/12/2011  . Fluconazole Nausea And Vomiting 06/12/2011  .  Hydrocodone Nausea And Vomiting 05/24/2012  . Hydrocodone-acetaminophen Nausea And Vomiting 01/15/2014  . Morphine and related Nausea Only 09/04/2014  . Neurontin [gabapentin] Other (See Comments) 06/12/2011  . Nsaids Other (See Comments) 07/11/2012  . Quinolones Hives and Itching 01/15/2014  . Sertraline hcl Nausea And Vomiting and Other (See Comments) 06/12/2011  . Sulfamethoxazole Hives and Itching 01/15/2014  . Latex Rash 06/13/2013  . Sulfa antibiotics Rash 06/12/2011    No current facility-administered medications on file prior to encounter.   Current Outpatient Prescriptions on File Prior to Encounter  Medication Sig Dispense Refill  . CALCIUM PO Take 1 tablet by mouth daily.    Marland Kitchen dicyclomine (BENTYL) 20 MG tablet Take 1 tablet (20 mg total) by  mouth 4 (four) times daily -  before meals and at bedtime. 60 tablet 5  . diphenhydrAMINE (BENADRYL) 12.5 MG/5ML liquid Take 25 mg by mouth 4 (four) times daily as needed for itching or allergies.     Marland Kitchen EPINEPHrine (EPIPEN) 0.3 mg/0.3 mL IJ SOAJ injection Inject 0.3 mLs (0.3 mg total) into the muscle daily as needed (anaphylaxis). (Patient taking differently: Inject 0.3 mg into the muscle as needed (allergic reaction). ) 1 Device 3  . feeding supplement, RESOURCE BREEZE, (RESOURCE BREEZE) LIQD Take 1 Container by mouth 3 (three) times daily between meals.    . fluocinonide cream (LIDEX) 2.11 % Apply 1 application topically 2 (two) times daily as needed (rash). (Patient taking differently: Apply 1 application topically 2 (two) times daily as needed (eczema). )    . ketoconazole (NIZORAL) 2 % shampoo Apply 1 application topically 2 (two) times a week. (Patient taking differently: Apply 1 application topically See admin instructions. Use twice a week as needed for eczema) 120 mL 0  . ondansetron (ZOFRAN) 4 MG tablet TAKE 1 TABLET (4 MG TOTAL) BY MOUTH EVERY 8 (EIGHT) HOURS AS NEEDED FOR NAUSEA OR VOMITING. (Patient taking differently: Take 4 mg by  mouth every 8 (eight) hours as needed for nausea or vomiting. ) 60 tablet 1  . oxyCODONE-acetaminophen (ROXICET) 5-325 MG per tablet Take 1 tablet by mouth every 8 (eight) hours as needed for severe pain. 30 tablet 0  . polyethylene glycol powder (GLYCOLAX/MIRALAX) powder Take 17 g by mouth daily. (Patient taking differently: Take 17 g by mouth at bedtime. Mix in 8 oz of water and drink) 1700 g 1  . Probiotic Product (PROBIOTIC DAILY PO) Take 1 tablet by mouth daily.     . ranitidine (ZANTAC) 150 MG tablet Take 1 tablet (150 mg total) by mouth 2 (two) times daily. 180 tablet 1  . Simethicone (GAS-X EXTRA STRENGTH PO) Take 1 tablet by mouth 3 (three) times daily after meals.     . Vitamin D, Ergocalciferol, (DRISDOL) 50000 UNITS CAPS capsule Take 1 capsule (50,000 Units total) by mouth every 7 (seven) days. (Patient taking differently: Take 50,000 Units by mouth every 7 (seven) days. On Saturday) 12 capsule 3  . albuterol (PROVENTIL HFA;VENTOLIN HFA) 108 (90 BASE) MCG/ACT inhaler Inhale 2 puffs into the lungs every 6 (six) hours as needed for wheezing. (Patient not taking: Reported on 09/04/2014) 1 Inhaler 2  . clidinium-chlordiazePOXIDE (LIBRAX) 5-2.5 MG per capsule TAKE ONE CAPSULE BY MOUTH 4 TIMES A DAY BEFORE MEALS AND AT BEDTIME, AS NEEDED FOR ABDOMINAL PAIN (Patient not taking: Reported on 09/04/2014) 120 capsule 3  . fluconazole (DIFLUCAN) 150 MG tablet Take 1 tablet (150 mg total) by mouth once. (Patient not taking: Reported on 09/04/2014) 1 tablet 0  . metroNIDAZOLE (FLAGYL) 500 MG tablet Take 1 tablet (500 mg total) by mouth every 8 (eight) hours. (Patient not taking: Reported on 09/04/2014) 36 tablet 0  . omeprazole (PRILOSEC) 40 MG capsule Take 1 capsule (40 mg total) by mouth daily. (Patient not taking: Reported on 09/04/2014) 15 capsule 0  . potassium chloride SA (K-DUR,KLOR-CON) 20 MEQ tablet Take 1 tablet BID for 2 days, Take 1 tablet once a day for 4 days (Patient not taking: Reported on  09/04/2014) 15 tablet 0  . predniSONE (DELTASONE) 10 MG tablet Take 3 tabs daily 3 days, then 2 tabs daily 3 days, then 1 tab daily 3 days, then stop. (Patient not taking: Reported on 09/04/2014) 18 tablet 0  .  saccharomyces boulardii (FLORASTOR) 250 MG capsule Take 1 capsule (250 mg total) by mouth 2 (two) times daily. (Patient not taking: Reported on 09/04/2014) 60 capsule 0  . sucralfate (CARAFATE) 1 G tablet Take 1 tablet (1 g total) by mouth 4 (four) times daily -  with meals and at bedtime. (Patient not taking: Reported on 09/04/2014) 28 tablet 0  . traMADol (ULTRAM) 50 MG tablet Take 1 tablet (50 mg total) by mouth every 6 (six) hours as needed. (Patient not taking: Reported on 09/04/2014) 30 tablet 0     REVIEW OF SYSTEMS:  Negative aside from past medical history  PHYSICAL EXAMINATION:  General: The patient is a well-nourished female, in no acute distress. Very thin Vital signs are BP 137/72 mmHg  Pulse 74  Temp(Src) 98.9 F (37.2 C) (Oral)  Resp 16  Ht 5\' 5"  (1.651 m)  Wt 81 lb (36.741 kg)  BMI 13.48 kg/m2  SpO2 100% Pulmonary: There is a good air exchange Abdomen: Soft and non-tender Musculoskeletal: There are no major deformities.  There is no significant extremity pain. Neurologic: No focal weakness or paresthesias are detected, Skin: There are no ulcer or rashes noted. Psychiatric: The patient has normal affect. Cardiovascular: Palpable radial pulses bilaterally She does have tenderness over her right groin. I do not feel pulses in her right or left groin. She does have motor and sensory function intact in her feet bilaterally    Vascular Lab Studies:  Lower extremity arterial studies this evening showed an audible flow on the left and dampened flow is an ankle arm index of 0.3 on the right  CT angiogram revealed occlusion of her left common iliac stent. She also has occlusion of her right common femoral artery with reconstitution of the superficial femoral and  profundus femoris artery.  Impression and Plan:  Bilateral lower extremity arterial insufficiency. Will be taken to the operating room for treatment. Explained she may need femoral-femoral bypass due to the occlusion of the recently placed left iliac stent.    Curt Jews [facility] Office: [phone]

## 2014-09-04 NOTE — Progress Notes (Signed)
VASCULAR LAB PRELIMINARY  ARTERIAL  ABI completed:Duplex of the right lower extremity reveals no significant arterial blood flow throughout the thigh.  Duplex of the left lower extremity reveals dampened monophasic blood flow in the common femoral artery and no flow noted in the femoral artery. There is some severely dampened monophasic flow noted in the bilateral popliteal and proximal posterior tibial arteries. Hematoma noted in the right groin.    RIGHT    LEFT    PRESSURE WAVEFORM  PRESSURE WAVEFORM  BRACHIAL 135 T BRACHIAL 133 T  DP 45   DM DP inaudible   AT   AT    PT inaudible  PT inaudible   PER   PER    GREAT TOE  NA GREAT TOE  NA    RIGHT LEFT  ABI 0.3 (question if venous flow) inaudible     Cynthia Bowen, RVT 09/04/2014, 7:11 PM

## 2014-09-04 NOTE — Anesthesia Procedure Notes (Signed)
Procedure Name: Intubation Date/Time: 09/04/2014 10:20 PM Performed by: Valetta Fuller Pre-anesthesia Checklist: Patient identified, Emergency Drugs available, Suction available and Patient being monitored Patient Re-evaluated:Patient Re-evaluated prior to inductionOxygen Delivery Method: Circle system utilized Preoxygenation: Pre-oxygenation with 100% oxygen Intubation Type: IV induction, Rapid sequence and Cricoid Pressure applied Laryngoscope Size: Miller and 2 Grade View: Grade II Tube type: Subglottic suction tube Number of attempts: 1 Airway Equipment and Method: Stylet Placement Confirmation: ETT inserted through vocal cords under direct vision,  positive ETCO2 and breath sounds checked- equal and bilateral Secured at: 20 cm Tube secured with: Tape Dental Injury: Teeth and Oropharynx as per pre-operative assessment

## 2014-09-05 DIAGNOSIS — J449 Chronic obstructive pulmonary disease, unspecified: Secondary | ICD-10-CM | POA: Diagnosis present

## 2014-09-05 DIAGNOSIS — K219 Gastro-esophageal reflux disease without esophagitis: Secondary | ICD-10-CM | POA: Diagnosis present

## 2014-09-05 DIAGNOSIS — Z681 Body mass index (BMI) 19 or less, adult: Secondary | ICD-10-CM | POA: Diagnosis not present

## 2014-09-05 DIAGNOSIS — I70203 Unspecified atherosclerosis of native arteries of extremities, bilateral legs: Secondary | ICD-10-CM | POA: Diagnosis present

## 2014-09-05 DIAGNOSIS — M797 Fibromyalgia: Secondary | ICD-10-CM | POA: Diagnosis present

## 2014-09-05 DIAGNOSIS — I998 Other disorder of circulatory system: Secondary | ICD-10-CM | POA: Diagnosis present

## 2014-09-05 DIAGNOSIS — M79605 Pain in left leg: Secondary | ICD-10-CM | POA: Diagnosis present

## 2014-09-05 DIAGNOSIS — E43 Unspecified severe protein-calorie malnutrition: Secondary | ICD-10-CM | POA: Diagnosis present

## 2014-09-05 DIAGNOSIS — I7092 Chronic total occlusion of artery of the extremities: Secondary | ICD-10-CM | POA: Diagnosis present

## 2014-09-05 DIAGNOSIS — M1990 Unspecified osteoarthritis, unspecified site: Secondary | ICD-10-CM | POA: Diagnosis present

## 2014-09-05 DIAGNOSIS — Z87891 Personal history of nicotine dependence: Secondary | ICD-10-CM | POA: Diagnosis not present

## 2014-09-05 DIAGNOSIS — T82858A Stenosis of vascular prosthetic devices, implants and grafts, initial encounter: Secondary | ICD-10-CM | POA: Diagnosis present

## 2014-09-05 LAB — POCT I-STAT 4, (NA,K, GLUC, HGB,HCT)
GLUCOSE: 106 mg/dL — AB (ref 65–99)
HCT: 25 % — ABNORMAL LOW (ref 36.0–46.0)
Hemoglobin: 8.5 g/dL — ABNORMAL LOW (ref 12.0–15.0)
Potassium: 3.6 mmol/L (ref 3.5–5.1)
Sodium: 142 mmol/L (ref 135–145)

## 2014-09-05 MED ORDER — SIMETHICONE 80 MG PO CHEW
125.0000 mg | CHEWABLE_TABLET | Freq: Three times a day (TID) | ORAL | Status: DC
Start: 2014-09-05 — End: 2014-09-09
  Administered 2014-09-05 – 2014-09-09 (×12): 120 mg via ORAL
  Filled 2014-09-05 (×17): qty 2

## 2014-09-05 MED ORDER — HYDROMORPHONE HCL 1 MG/ML IJ SOLN
INTRAMUSCULAR | Status: AC
  Filled 2014-09-05: qty 1

## 2014-09-05 MED ORDER — METOPROLOL TARTRATE 1 MG/ML IV SOLN: 2.0000 mg | INTRAVENOUS | Status: DC | PRN

## 2014-09-05 MED ORDER — DICYCLOMINE HCL 20 MG PO TABS
20.0000 mg | ORAL_TABLET | Freq: Three times a day (TID) | ORAL | Status: DC
  Administered 2014-09-05 – 2014-09-09 (×16): 20 mg via ORAL
  Filled 2014-09-05 (×23): qty 1

## 2014-09-05 MED ORDER — POLYETHYLENE GLYCOL 3350 17 G PO PACK
17.0000 g | PACK | Freq: Every day | ORAL | Status: DC
  Administered 2014-09-05 – 2014-09-08 (×4): 17 g via ORAL
  Filled 2014-09-05 (×6): qty 1

## 2014-09-05 MED ORDER — PHENOL 1.4 % MT LIQD: 1.0000 | OROMUCOSAL | Status: DC | PRN

## 2014-09-05 MED ORDER — KETOTIFEN FUMARATE 0.025 % OP SOLN
1.0000 [drp] | Freq: Two times a day (BID) | OPHTHALMIC | Status: DC
  Administered 2014-09-05 – 2014-09-09 (×9): 1 [drp] via OPHTHALMIC
  Filled 2014-09-05: qty 5

## 2014-09-05 MED ORDER — SODIUM CHLORIDE 0.9 % IV SOLN
INTRAVENOUS | Status: DC
  Administered 2014-09-05: 02:00:00 via INTRAVENOUS

## 2014-09-05 MED ORDER — HYDRALAZINE HCL 20 MG/ML IJ SOLN: 5.0000 mg | INTRAMUSCULAR | Status: DC | PRN

## 2014-09-05 MED ORDER — ADULT MULTIVITAMIN W/MINERALS CH
1.0000 | ORAL_TABLET | Freq: Every day | ORAL | Status: DC
  Administered 2014-09-05 – 2014-09-09 (×5): 1 via ORAL
  Filled 2014-09-05 (×5): qty 1

## 2014-09-05 MED ORDER — POLYETHYLENE GLYCOL 3350 17 GM/SCOOP PO POWD
17.0000 g | Freq: Every day | ORAL | Status: DC
  Filled 2014-09-05: qty 255

## 2014-09-05 MED ORDER — GUAIFENESIN-DM 100-10 MG/5ML PO SYRP
15.0000 mL | ORAL_SOLUTION | ORAL | Status: DC | PRN
  Administered 2014-09-07: 15 mL via ORAL
  Filled 2014-09-05: qty 15

## 2014-09-05 MED ORDER — LABETALOL HCL 5 MG/ML IV SOLN
10.0000 mg | INTRAVENOUS | Status: DC | PRN
  Filled 2014-09-05: qty 4

## 2014-09-05 MED ORDER — PROMETHAZINE HCL 25 MG/ML IJ SOLN: 6.2500 mg | INTRAMUSCULAR | Status: DC | PRN

## 2014-09-05 MED ORDER — VITAMIN D (ERGOCALCIFEROL) 1.25 MG (50000 UNIT) PO CAPS: 50000.0000 [IU] | ORAL_CAPSULE | ORAL | Status: DC

## 2014-09-05 MED ORDER — PROTAMINE SULFATE 10 MG/ML IV SOLN
INTRAVENOUS | Status: AC
  Filled 2014-09-05: qty 5

## 2014-09-05 MED ORDER — DOCUSATE SODIUM 100 MG PO CAPS
100.0000 mg | ORAL_CAPSULE | Freq: Every day | ORAL | Status: DC
  Administered 2014-09-06 – 2014-09-09 (×4): 100 mg via ORAL
  Filled 2014-09-05 (×4): qty 1

## 2014-09-05 MED ORDER — ONDANSETRON HCL 4 MG/2ML IJ SOLN
INTRAMUSCULAR | Status: AC
  Filled 2014-09-05: qty 2

## 2014-09-05 MED ORDER — BISACODYL 10 MG RE SUPP: 10.0000 mg | Freq: Every day | RECTAL | Status: DC | PRN

## 2014-09-05 MED ORDER — HYDROMORPHONE HCL 1 MG/ML IJ SOLN
0.5000 mg | INTRAMUSCULAR | Status: DC | PRN
  Administered 2014-09-05: 1 mg via INTRAVENOUS
  Filled 2014-09-05: qty 1

## 2014-09-05 MED ORDER — ACETAMINOPHEN 500 MG PO TABS
1000.0000 mg | ORAL_TABLET | Freq: Every day | ORAL | Status: DC | PRN
  Administered 2014-09-06: 1000 mg via ORAL
  Filled 2014-09-05: qty 2

## 2014-09-05 MED ORDER — NEOSTIGMINE METHYLSULFATE 10 MG/10ML IV SOLN
INTRAVENOUS | Status: DC | PRN
  Administered 2014-09-05: 4 mg via INTRAVENOUS

## 2014-09-05 MED ORDER — ONDANSETRON HCL 4 MG PO TABS: 4.0000 mg | ORAL_TABLET | Freq: Three times a day (TID) | ORAL | Status: DC | PRN

## 2014-09-05 MED ORDER — ALUM & MAG HYDROXIDE-SIMETH 200-200-20 MG/5ML PO SUSP: 15.0000 mL | ORAL | Status: DC | PRN

## 2014-09-05 MED ORDER — GLYCOPYRROLATE 0.2 MG/ML IJ SOLN
INTRAMUSCULAR | Status: DC | PRN
  Administered 2014-09-05: 0.6 mg via INTRAVENOUS

## 2014-09-05 MED ORDER — HYDROMORPHONE HCL 1 MG/ML IJ SOLN
0.2500 mg | INTRAMUSCULAR | Status: DC | PRN
  Administered 2014-09-05 (×4): 0.5 mg via INTRAVENOUS

## 2014-09-05 MED ORDER — PROTAMINE SULFATE 10 MG/ML IV SOLN
INTRAVENOUS | Status: DC | PRN
  Administered 2014-09-05: 50 mg via INTRAVENOUS

## 2014-09-05 MED ORDER — OXYCODONE-ACETAMINOPHEN 5-325 MG PO TABS
1.0000 | ORAL_TABLET | Freq: Three times a day (TID) | ORAL | Status: DC | PRN
  Administered 2014-09-05 – 2014-09-06 (×3): 1 via ORAL
  Filled 2014-09-05 (×3): qty 1

## 2014-09-05 MED ORDER — ONDANSETRON HCL 4 MG/2ML IJ SOLN
INTRAMUSCULAR | Status: DC | PRN
  Administered 2014-09-05: 4 mg via INTRAVENOUS

## 2014-09-05 MED ORDER — MAGNESIUM SULFATE 2 GM/50ML IV SOLN
2.0000 g | Freq: Every day | INTRAVENOUS | Status: DC | PRN
  Filled 2014-09-05: qty 50

## 2014-09-05 MED ORDER — SODIUM CHLORIDE 0.9 % IV SOLN: 500.0000 mL | Freq: Once | INTRAVENOUS | Status: AC | PRN

## 2014-09-05 MED ORDER — HYPROMELLOSE (GONIOSCOPIC) 2.5 % OP SOLN
2.0000 [drp] | Freq: Three times a day (TID) | OPHTHALMIC | Status: DC
  Administered 2014-09-05 – 2014-09-09 (×13): 2 [drp] via OPHTHALMIC
  Filled 2014-09-05: qty 15

## 2014-09-05 MED ORDER — ONDANSETRON HCL 4 MG/2ML IJ SOLN
4.0000 mg | Freq: Four times a day (QID) | INTRAMUSCULAR | Status: DC | PRN
  Administered 2014-09-05: 4 mg via INTRAVENOUS
  Filled 2014-09-05: qty 2

## 2014-09-05 MED ORDER — NEOSTIGMINE METHYLSULFATE 10 MG/10ML IV SOLN
INTRAVENOUS | Status: AC
  Filled 2014-09-05: qty 1

## 2014-09-05 MED ORDER — GLYCOPYRROLATE 0.2 MG/ML IJ SOLN
INTRAMUSCULAR | Status: AC
  Filled 2014-09-05: qty 3

## 2014-09-05 MED ORDER — POTASSIUM CHLORIDE CRYS ER 20 MEQ PO TBCR
20.0000 meq | EXTENDED_RELEASE_TABLET | Freq: Every day | ORAL | Status: DC | PRN
Start: 2014-09-05 — End: 2014-09-09

## 2014-09-05 MED ORDER — FAMOTIDINE 20 MG PO TABS
20.0000 mg | ORAL_TABLET | Freq: Two times a day (BID) | ORAL | Status: DC
  Administered 2014-09-05 – 2014-09-09 (×9): 20 mg via ORAL
  Filled 2014-09-05 (×10): qty 1

## 2014-09-05 MED ORDER — MAGNESIUM HYDROXIDE 400 MG/5ML PO SUSP: 30.0000 mL | Freq: Every day | ORAL | Status: DC | PRN

## 2014-09-05 MED ORDER — DIPHENHYDRAMINE HCL 12.5 MG/5ML PO LIQD
25.0000 mg | Freq: Four times a day (QID) | ORAL | Status: DC | PRN
  Filled 2014-09-05: qty 10

## 2014-09-05 MED ORDER — HYDROMORPHONE HCL 1 MG/ML IJ SOLN: 0.5000 mg | INTRAMUSCULAR | Status: DC | PRN

## 2014-09-05 MED ORDER — ENOXAPARIN SODIUM 30 MG/0.3ML ~~LOC~~ SOLN
30.0000 mg | SUBCUTANEOUS | Status: DC
  Administered 2014-09-06 – 2014-09-08 (×3): 30 mg via SUBCUTANEOUS
  Filled 2014-09-05 (×4): qty 0.3

## 2014-09-05 MED ORDER — DEXTROSE 5 % IV SOLN
1.5000 g | Freq: Two times a day (BID) | INTRAVENOUS | Status: AC
  Administered 2014-09-05 (×2): 1.5 g via INTRAVENOUS
  Filled 2014-09-05 (×2): qty 1.5

## 2014-09-05 MED ORDER — HYDROMORPHONE HCL 1 MG/ML IJ SOLN
0.5000 mg | INTRAMUSCULAR | Status: DC | PRN
  Administered 2014-09-05 – 2014-09-07 (×9): 1 mg via INTRAVENOUS
  Filled 2014-09-05 (×9): qty 1

## 2014-09-05 MED ORDER — BOOST / RESOURCE BREEZE PO LIQD
1.0000 | Freq: Three times a day (TID) | ORAL | Status: DC
  Administered 2014-09-05 – 2014-09-09 (×11): 1 via ORAL
  Filled 2014-09-05: qty 1

## 2014-09-05 MED ORDER — FLUOCINONIDE 0.05 % EX CREA
1.0000 "application " | TOPICAL_CREAM | Freq: Two times a day (BID) | CUTANEOUS | Status: DC | PRN
  Filled 2014-09-05: qty 30

## 2014-09-05 NOTE — Transfer of Care (Signed)
Immediate Anesthesia Transfer of Care Note  Patient: Cynthia Bowen  Procedure(s) Performed: Procedure(s): Right External Iliac and right comon femoral endarterectomy with patching and right to left BYPASS GRAFT FEMORAL-FEMORAL ARTERY,  (Bilateral)  Patient Location: PACU  Anesthesia Type:General  Level of Consciousness: awake, sedated and patient cooperative  Airway & Oxygen Therapy: Patient connected to nasal cannula oxygen  Post-op Assessment: Report given to RN, Post -op Vital signs reviewed and stable and Patient moving all extremities X 4  Post vital signs: Reviewed and stable  Last Vitals:  Filed Vitals:   09/04/14 2030  BP: 137/72  Pulse: 74  Temp:   Resp:     Complications: No apparent anesthesia complications

## 2014-09-05 NOTE — Evaluation (Signed)
Physical Therapy Evaluation Patient Details Name: Cynthia Bowen MRN: [id number] DOB: Sep 21, 1945 Today's Date: 09/05/2014   History of Present Illness  Pt admitted with bil acute lower extremity arterial insufficiency. Pt s/pR external iliac and common femoral endarterectomy and Dacron pathc angioplasty, right to left femorofemoral bypass with graft.   Clinical Impression  Pt with c/o pain from surgery however was given pain meds prior to PT and patient tolerated ambulation well. Pt functioning at supervision level at this time and suspect she will be safe to d/c home with intermittent supervision once medically cleared.    Follow Up Recommendations No PT follow up;Supervision - Intermittent    Equipment Recommendations  Other (comment) (rollator (pt was given RW but wants rollator instead))    Recommendations for Other Services       Precautions / Restrictions Precautions Precautions: Fall Restrictions Weight Bearing Restrictions: No      Mobility  Bed Mobility Overal bed mobility: Modified Independent             General bed mobility comments: pt able to transfer self to EOB with HOB elevated without difficulty  Transfers Overall transfer level: Needs assistance Equipment used: Rolling walker (2 wheeled) Transfers: Sit to/from Stand Sit to Stand: Supervision         General transfer comment: v/c's for safe hand placement  Ambulation/Gait Ambulation/Gait assistance: Supervision Ambulation Distance (Feet): 150 Feet Assistive device: Rolling walker (2 wheeled) Gait Pattern/deviations: Step-through pattern;Decreased stride length Gait velocity: decreased   General Gait Details: increased bilat UE WBing due to bilat LE pain, fluid gait pattern, no episodes of LOB  Stairs            Wheelchair Mobility    Modified Rankin (Stroke Patients Only)       Balance Overall balance assessment: No apparent balance deficits (not formally assessed)                                            Pertinent Vitals/Pain Pain Assessment: 0-10 Pain Score: 5  Pain Location: bilat groin/hips Pain Descriptors / Indicators: Throbbing Pain Intervention(s): Monitored during session;Premedicated before session    [place] expects to be discharged to:: Private residence Living Arrangements: Alone Available Help at Discharge: Family;Available PRN/intermittently Type of Home: Apartment Home Access: Stairs to enter Entrance Stairs-Rails: None Entrance Stairs-Number of Steps: 1 Home Layout: One level Home Equipment: Cane - single point;Walker - 2 wheels      Prior Function Level of Independence: Independent               Hand Dominance        Extremity/Trunk Assessment   Upper Extremity Assessment: Overall WFL for tasks assessed           Lower Extremity Assessment: Generalized weakness (due bilat LE surgery but able to initiate hip/knee flexion)      Cervical / Trunk Assessment: Normal  Communication   Communication: No difficulties  Cognition Arousal/Alertness: Awake/alert Behavior During Therapy: WFL for tasks assessed/performed Overall Cognitive Status: Within Functional Limits for tasks assessed                      General Comments      Exercises General Exercises - Lower Extremity Ankle Circles/Pumps: AROM;Left;10 reps Long Arc Quad: AROM;Both;5 reps;Seated Heel Slides: AROM;Both;10 reps;Seated      Assessment/Plan  PT Assessment Patient needs continued PT services  PT Diagnosis Difficulty walking;Acute pain   PT Problem List Decreased strength;Decreased activity tolerance;Decreased balance;Decreased mobility  PT Treatment Interventions DME instruction;Gait training;Stair training;Functional mobility training;Therapeutic activities;Therapeutic exercise;Balance training   PT Goals (Current goals can be found in the Care Plan section) Acute Rehab PT Goals Patient  Stated Goal: home PT Goal Formulation: With patient Time For Goal Achievement: 09/12/14    Frequency Min 3X/week   Barriers to discharge Decreased caregiver support alone    Co-evaluation               End of Session Equipment Utilized During Treatment: Gait belt Activity Tolerance: Patient tolerated treatment well Patient left: in chair;with call bell/phone within reach Nurse Communication: Mobility status         Time: 9244-6286 PT Time Calculation (min) (ACUTE ONLY): 23 min   Charges:   PT Evaluation $Initial PT Evaluation Tier I: 1 Procedure PT Treatments $Gait Training: 8-22 mins   PT G CodesKingsley Callander 09/05/2014, 4:12 PM   Kittie Plater, PT, DPT Pager #: [phone] Office #: [phone]

## 2014-09-05 NOTE — Op Note (Signed)
OPERATIVE REPORT  DATE OF SURGERY: 09/05/2014  PATIENT: Cynthia Bowen, 69 y.o. female MRN: [id number]  DOB: 1945-06-27  PRE-OPERATIVE DIAGNOSIS: Bilateral acute lower extremity arterial insufficiency  POST-OPERATIVE DIAGNOSIS:  Same  PROCEDURE: Right external iliac and common femoral endarterectomy and Dacron patch angioplasty, right to left femorofemoral bypass with 8 mm Hemashield graft  SURGEON:  Curt Jews, M.D.  PHYSICIAN ASSISTANT: Samantha Rhyne PA-C  ANESTHESIA:  Gen.  EBL: 100 ml  Total I/O In: 1500 [I.V.:1000; IV Piggyback:500] Out: 350 [Urine:250; Blood:100]  BLOOD ADMINISTERED: None  DRAINS: None  SPECIMEN: None  COUNTS CORRECT:  YES  PLAN OF CARE: PACU stable with audible anterior tibial flow bilaterally   PATIENT DISPOSITION:  PACU - hemodynamically stable  PROCEDURE DETAILS: Patient presented to the emergency room with bilateral lower extremity ischemia. Had had recent treatment with right groin cutdown and covered stent of distal aorta. Also had stenting of left common iliac artery during the same procedure. Patient was seen in the office infarct before this procedure and was found to have some swelling and irregularity in the right groin. She had been scheduled for exploration and possible endarterectomy on 09/06/2014. She presented to the emergency room with bilateral leg discomfort. She was found to have no audible flow in the left leg and ankle arm index of 0.3 in the right. She had no pulses bilaterally. CT angiogram revealed occlusion of her recently placed left common iliac stent. Also had occlusion of her right common femoral artery. She was taken to the operating room for exploration.  The patient placed supine position. The area both groins and both legs were prepped and draped in sterile fashion. Incision was made over the left groin and carried down to isolate the common superficial femoral and profundus femoris arteries. Next separate  incision was made to the prior wound on the right carried down to isolate the common superficial femoral and profunda femoris arteries. There was no pulse present. The external iliac artery was exposed up under the inguinal ligament. Patient was given 6000 units intravenous heparin and after adequate circulation time the external iliac was occluded with a Henley clamp on the right. The common femoral was opened. There was extensive plaque present and clot in the lumen. There was no flow through this area. The arteriotomy was continued onto the superficial femoral and profunda femoris arteries. There was good backbleeding with appropriate from the profunda with no evidence of clot this level. These were reoccluded. The arteriotomy was in continued longitudinally up on the external iliac under the inguinal ligament. This was taken to the prior transverse incision from the recent groin exposure. The sutures from this were removed. Endarterectomy was begun on the inguinal ligament on the external iliac artery and continued down deep femoral artery. Several tacking sutures were used to prevent dissection flap in the deep femoral artery on the right. Due to the endarterectomy doing under the inguinal ligament incision was made to sew Dacron patch from the proximal portion of this. This was done with a running 6-0 Prolene suture. The lower 1-1-1/2 cm was closed with a patch. An 8 mm Hemashield graft was brought onto the field and this was sewn to this the distal portion of the arteriotomy to include the area going down to the deep femoral artery. This was made millimeter Hemashield graft was sewn with 6-0 Prolene suture. This anastomosis completed clamps were placed on the femorofemoral graft and the clamp from the external iliac superficial deep femoral artery  were removed. There was good hemostasis. Next a tunnel was created from the left the right groin. Patient had prior extensive abdominal incisions. This was brought  through the scar in the midline. The graft was brought to the level of the left groin. The left common femoral artery was occluded at the inguinal ligament in the deep and superficial femoral arteries were occluded with blue vessel loops. The common femoral artery was opened with 11 blade incision longitudinally onto the superficial artery with Potts scissors. Superficial artery was chronically occluded. Grafts cut to appropriate length and was sewn end-to-side to the junction of the common superficial femoral artery with a running 5-0 Prolene suture. Prior to completion of the closure the usual flushing maneuvers were undertaken. Anastomosis completed and clamps removed from the graft. There was excellent Doppler flow in the deep femoral artery bilaterally and also audible flow in the anterior tibial bilaterally. The patient was given 50 mg of protamine to reverse the heparin. Wounds irrigated with saline. Hemostasis tablet cautery. Wounds were closed with 2-0 Vicryl in several layers and subcutaneous fat. Skin was closed with 3-0 subcuticular Vicryl suture. Dressing was applied. Patient was transferred to the recovery room in stable condition   Curt Jews, M.D. 09/05/2014 12:45 AM

## 2014-09-05 NOTE — Care Management Note (Signed)
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  Action/Plan:  Return to home when medically stable. CM to f/u with d/c needs. Expected Discharge Date:                  Expected Discharge Plan:  Home/Self Care  In-House Referral:     Discharge planning Services  CM Consult  Post Acute Care Choice:    Choice offered to:     DME Arranged:    DME Agency:     HH Arranged:    HH Agency:     Status of Service:  In process, will continue to follow  Medicare Important Message Given:    Date Medicare IM Given:    Medicare IM give by:    Date Additional Medicare IM Given:    Additional Medicare Important Message give by:     If discussed at [place] of Stay Meetings, dates discussed:    Additional Comments: Emergency contact :  Loraine Leriche (nephew) [phone] , Criselda Peaches (sister) [phone] Sharin Mons, RN 09/05/2014, 8:38 AM

## 2014-09-05 NOTE — Progress Notes (Signed)
UR COMPLETED  

## 2014-09-05 NOTE — Progress Notes (Addendum)
Progress Note    09/05/2014 7:41 AM 1 Day Post-Op  Subjective:  C/o soreness  Afebrile 539'J-673'A systolic  (193'X immediately post op) HR 50's-60's NSR 99% 2LO2NC  Filed Vitals:   09/05/14 0600  BP: 112/60  Pulse: 59  Temp:   Resp: 11    Physical Exam: Cardiac:  regular Lungs:  Non labored Incisions:  C/d/i bilateral groins Extremities:  + audible DP doppler signals AT bilaterally; 2+ palpable graft pulse Abdomen:  Soft, mildly tender  CBC    Component Value Date/Time   WBC 8.7 09/04/2014 1811   WBC 7.5 07/16/2014 0950   WBC 8.2 08/26/2012 1300   RBC 3.20* 09/04/2014 1811   RBC 4.47 07/16/2014 0950   RBC 3.73* 08/26/2012 1300   RBC 2.75* 08/06/2012 0800   HGB 8.5* 09/05/2014 0021   HGB 12.6 07/16/2014 0950   HGB 10.6* 08/26/2012 1300   HCT 25.0* 09/05/2014 0021   HCT 39.9 07/16/2014 0950   HCT 31.2* 08/26/2012 1300   PLT 301 09/04/2014 1811   PLT 516* 08/26/2012 1300   MCV 85.9 09/04/2014 1811   MCV 89.2 07/16/2014 0950   MCV 84 08/26/2012 1300   MCH 29.1 09/04/2014 1811   MCH 28.2 07/16/2014 0950   MCH 28.3 08/26/2012 1300   MCHC 33.8 09/04/2014 1811   MCHC 31.6* 07/16/2014 0950   MCHC 33.9 08/26/2012 1300   RDW 16.2* 09/04/2014 1811   RDW 17.1* 08/26/2012 1300   LYMPHSABS 1.4 06/02/2014 1128   LYMPHSABS 1.4 08/26/2012 1300   MONOABS 0.9 06/02/2014 1128   MONOABS 0.3 08/26/2012 1300   EOSABS 0.0 06/02/2014 1128   EOSABS 0.1 08/26/2012 1300   BASOSABS 0.0 06/02/2014 1128   BASOSABS 0.0 08/26/2012 1300    BMET    Component Value Date/Time   NA 142 09/05/2014 0021   NA 138 08/26/2012 1300   K 3.6 09/05/2014 0021   K 4.0 08/26/2012 1300   CL 107 09/04/2014 1811   CL 108* 08/26/2012 1300   CO2 24 09/04/2014 1811   CO2 24 08/26/2012 1300   GLUCOSE 106* 09/05/2014 0021   GLUCOSE 83 08/26/2012 1300   BUN 8 09/04/2014 1811   BUN 29* 08/26/2012 1300   BUN 4 08/19/2009   CREATININE 0.53 09/04/2014 1811   CREATININE 0.61 07/16/2014 0948   CREATININE 0.79 08/26/2012 1300   CALCIUM 8.5* 09/04/2014 1811   CALCIUM 9.9 08/26/2012 1300   GFRNONAA >60 09/04/2014 1811   GFRNONAA >89 07/16/2014 0948   GFRNONAA >60 08/26/2012 1300   GFRAA >60 09/04/2014 1811   GFRAA >89 07/16/2014 0948   GFRAA >60 08/26/2012 1300    INR    Component Value Date/Time   INR 1.04 08/26/2014 1600     Intake/Output Summary (Last 24 hours) at 09/05/14 0741 Last data filed at 09/05/14 0700  Gross per 24 hour  Intake   2540 ml  Output   1425 ml  Net   1115 ml     Assessment:  69 y.o. female is s/p:  Right external iliac and common femoral endarterectomy and Dacron patch angioplasty, right to left femorofemoral bypass with 8 mm Hemashield graft  1 Day Post-Op  Plan: -pt doing well this am with patent femoral to femoral bypass grafting -leave foley in today and will d/c tomorrow morning -transfer to 2 west -DVT prophylaxis:  Lovenox to start today   Leontine Locket, PA-C [facility] [phone] 09/05/2014 7:41 AM    Palpale fem-fem pulse Bilateral pedal doppler signals  Calf compartments soft Transfer to 2W  WElls Jakob Kimberlin

## 2014-09-05 NOTE — Progress Notes (Signed)
Attempted to call report

## 2014-09-06 ENCOUNTER — Encounter (HOSPITAL_COMMUNITY): Payer: Self-pay | Admitting: Vascular Surgery

## 2014-09-06 ENCOUNTER — Inpatient Hospital Stay (HOSPITAL_COMMUNITY): Admission: RE | Admit: 2014-09-06 | Payer: Medicare Other | Source: Ambulatory Visit | Admitting: Vascular Surgery

## 2014-09-06 ENCOUNTER — Encounter (HOSPITAL_COMMUNITY): Admission: RE | Payer: Self-pay | Source: Ambulatory Visit

## 2014-09-06 LAB — BASIC METABOLIC PANEL
ANION GAP: 8 (ref 5–15)
BUN: <5 mg/dL — ABNORMAL LOW (ref 6–20)
CALCIUM: 8.9 mg/dL (ref 8.9–10.3)
CO2: 26 mmol/L (ref 22–32)
CREATININE: 0.52 mg/dL (ref 0.44–1.00)
Chloride: 101 mmol/L (ref 101–111)
GFR calc non Af Amer: >60 mL/min (ref >60)
Glucose, Bld: 110 mg/dL — ABNORMAL HIGH (ref 65–99)
Potassium: 3.3 mmol/L — ABNORMAL LOW (ref 3.5–5.1)
SODIUM: 135 mmol/L (ref 135–145)

## 2014-09-06 LAB — CBC
HCT: 32.2 % — ABNORMAL LOW (ref 36.0–46.0)
Hemoglobin: 10.8 g/dL — ABNORMAL LOW (ref 12.0–15.0)
MCH: 29 pg (ref 26.0–34.0)
MCHC: 33.5 g/dL (ref 30.0–36.0)
MCV: 86.3 fL (ref 78.0–100.0)
Platelets: 331 10*3/uL (ref 150–400)
RBC: 3.73 MIL/uL — ABNORMAL LOW (ref 3.87–5.11)
RDW: 16.3 % — ABNORMAL HIGH (ref 11.5–15.5)
WBC: 10.1 10*3/uL (ref 4.0–10.5)

## 2014-09-06 SURGERY — WOUND EXPLORATION
Anesthesia: General | Laterality: Right

## 2014-09-06 MED ORDER — OXYCODONE-ACETAMINOPHEN 5-325 MG PO TABS
1.0000 | ORAL_TABLET | ORAL | Status: DC | PRN
  Administered 2014-09-06 – 2014-09-07 (×8): 2 via ORAL
  Administered 2014-09-07 – 2014-09-08 (×4): 1 via ORAL
  Administered 2014-09-08: 2 via ORAL
  Administered 2014-09-09 (×2): 1 via ORAL
  Filled 2014-09-06 (×6): qty 2
  Filled 2014-09-06 (×2): qty 1
  Filled 2014-09-06 (×2): qty 2
  Filled 2014-09-06: qty 1
  Filled 2014-09-06 (×2): qty 2
  Filled 2014-09-06 (×2): qty 1

## 2014-09-06 NOTE — Evaluation (Addendum)
Occupational Therapy Evaluation Patient Details Name: Cynthia Bowen MRN: [id number] DOB: [birth year] Today's Date: 09/06/2014    History of Present Illness Pt admitted with bilateral acute lower extremity arterial insufficiency. Pt s/p R external iliac and common femoral endarterectomy and Dacron pathc angioplasty, right to left femorofemoral bypass with graft.    Clinical Impression   Pt s/p above. Pt independent with ADLs, PTA. Feel pt will benefit from acute OT to increase independence prior to d/c. Pt wanting additional session to review.    Follow Up Recommendations  No OT follow up;Supervision - Intermittent    Equipment Recommendations  3 in 1 bedside comode    Recommendations for Other Services       Precautions / Restrictions Precautions Precautions: Fall Restrictions Weight Bearing Restrictions: No      Mobility Bed Mobility        General bed mobility comments: not assessed  Transfers Overall transfer level: Needs assistance Transfers: Sit to/from Stand Sit to Stand: Supervision         General transfer comment: cue for hand placement when going to sit in chair.    Balance Overall balance assessment: Min guard for simulated tub transfer and supervision for ambulation-balance not formally assessed.                                           ADL Overall ADL's : Needs assistance/impaired                     Lower Body Dressing: Set up;Supervision/safety;Sit to/from stand   Toilet Transfer: RW;Supervision/safety;Ambulation (chair)       Tub/ Shower Transfer: Tub transfer;Min guard;Ambulation;Rolling walker   Functional mobility during ADLs: Supervision/safety;Min guard;Rolling walker (Min guard for simulated tub transfer) General ADL Comments: Educated on safety such as safe footwear, use of bag/basket on walker, sitting for most of LB ADLs, and recommended someone be with pt for tub transfer at least first few times.  Educated on tub transfer techniques and options for shower chair.     Vision  Pt wears glasses.   Perception     Praxis      Pertinent Vitals/Pain Pain Assessment: 0-10 Pain Score: 9  Pain Location: bilateral groins Pain Descriptors / Indicators: Sharp;Tender;Sore Pain Intervention(s): Limited activity within patient's tolerance;Repositioned;Monitored during session     Hand Dominance     Extremity/Trunk Assessment Upper Extremity Assessment Upper Extremity Assessment: Overall WFL for tasks assessed   Lower Extremity Assessment Lower Extremity Assessment: Defer to PT evaluation       Communication Communication Communication: No difficulties   Cognition Arousal/Alertness: Awake/alert Behavior During Therapy: WFL for tasks assessed/performed Overall Cognitive Status: Within Functional Limits for tasks assessed                     General Comments          Shoulder Instructions      Home Living Family/patient expects to be discharged to:: Private residence Living Arrangements: Alone Available Help at Discharge: Family;Available PRN/intermittently Type of Home: Apartment Home Access: Stairs to enter Entrance Stairs-Number of Steps: 1 Entrance Stairs-Rails: None Home Layout: One level     Bathroom Shower/Tub: Teacher, early years/pre: Standard (sink close)     Home Equipment: Cane - single point;Walker - 2 wheels          Prior Functioning/Environment Level  of Independence: Independent             OT Diagnosis: Acute pain   OT Problem List: Decreased range of motion;Decreased knowledge of precautions;Decreased knowledge of use of DME or AE;Pain;Decreased activity tolerance   OT Treatment/Interventions: Self-care/ADL training;DME and/or AE instruction;Therapeutic activities;Patient/family education;Balance training    OT Goals(Current goals can be found in the care plan section) Acute Rehab OT Goals Patient Stated Goal: go  home-get out of here OT Goal Formulation: With patient Time For Goal Achievement: 09/13/14 Potential to Achieve Goals: Good ADL Goals Pt Will Perform Lower Body Dressing: with modified independence;sit to/from stand Pt Will Transfer to Toilet: with modified independence;ambulating;grab bars;regular height toilet Pt Will Perform Tub/Shower Transfer: Tub transfer;with supervision;ambulating;rolling walker (tub DME TBD)  OT Frequency: Min 2X/week   Barriers to D/C:            Co-evaluation              End of Session Equipment Utilized During Treatment: Gait belt;Rolling walker  Activity Tolerance: tolerated treatment well however was tearful towards end of session due to pain Patient left: in chair;with call bell/phone within reach   Time: 0919-0933 OT Time Calculation (min): 14 min Charges:  OT General Charges $OT Visit: 1 Procedure OT Evaluation $Initial OT Evaluation Tier I: 1 Procedure G-CodesBenito Mccreedy OTR/L [id number] 09/06/2014, 9:45 AM

## 2014-09-06 NOTE — Care Management Note (Signed)
Case Management Note  Patient Details  Name: Cynthia Bowen MRN: [id number] Date of Birth: 07-27-1945  Subjective/Objective:     Pt admitted with lower ext. Ischemia s/p fempop              Action/Plan: PTA pt lived at home- was active with [facility] for HH-PT- has RW at home  Expected Discharge Date:                  Expected Discharge Plan:  [place]  In-House Referral:     Discharge planning Services  CM Consult  Post Acute Care Choice:  Durable Medical Equipment Choice offered to:  Patient  DME Arranged:  3-N-1 DME Agency:  [place]:  PT [place]:  [place]  Status of Service:  In process, will continue to follow  Medicare Important Message Given:    Date Medicare IM Given:    Medicare IM give by:    Date Additional Medicare IM Given:    Additional Medicare Important Message give by:     If discussed at [place] of Stay Meetings, dates discussed:    Additional Comments: Referral received for [facility] needs and possible rollator - per conversation with pt - pt states that she had recently gotten a RW at home through [facility] with the HH-PT she would rather have a rollator with 4 wheels- pt states that the HH-PT person is trying to get the RW changed out- explained to pt that it would probably be best to continue to let that person see if they could change it out as she may have to pay out of pocket for the rollator since she just got the RW (per Jermaine with [facility]- rollator cost out of pocket is $120+) pt will f/u with HH-PT regarding the rollator- however pt does state that she would like a 3n1 which would be covered by insurance- spoke with [facility] with [facility] who will bring 3n1 prior to discharge. Order for HH-PT placed and Colletta [place] with [facility] notified of resumption.   Dawayne Patricia, RN 09/06/2014, 12:03 PM

## 2014-09-06 NOTE — Progress Notes (Addendum)
Progress Note    09/06/2014 7:19 AM 2 Days Post-Op  Subjective:  C/o pain in groins and left heel; walked in hallways yesterday  Afebrile 400'Q systolic HR 67'Y NSR 195% RA  Filed Vitals:   09/06/14 0536  BP: 134/57  Pulse: 70  Temp: 98.2 F (36.8 C)  Resp: 16    Physical Exam: Cardiac:  regular Lungs:  Non labored Incisions:  Bilateral groin incisions are c/d/i Extremities:  + audible DP doppler signals; + palpable graft pulse Abdomen:  Soft; tender over graft  CBC    Component Value Date/Time   WBC 10.1 09/06/2014 0454   WBC 7.5 07/16/2014 0950   WBC 8.2 08/26/2012 1300   RBC 3.73* 09/06/2014 0454   RBC 4.47 07/16/2014 0950   RBC 3.73* 08/26/2012 1300   RBC 2.75* 08/06/2012 0800   HGB 10.8* 09/06/2014 0454   HGB 12.6 07/16/2014 0950   HGB 10.6* 08/26/2012 1300   HCT 32.2* 09/06/2014 0454   HCT 39.9 07/16/2014 0950   HCT 31.2* 08/26/2012 1300   PLT 331 09/06/2014 0454   PLT 516* 08/26/2012 1300   MCV 86.3 09/06/2014 0454   MCV 89.2 07/16/2014 0950   MCV 84 08/26/2012 1300   MCH 29.0 09/06/2014 0454   MCH 28.2 07/16/2014 0950   MCH 28.3 08/26/2012 1300   MCHC 33.5 09/06/2014 0454   MCHC 31.6* 07/16/2014 0950   MCHC 33.9 08/26/2012 1300   RDW 16.3* 09/06/2014 0454   RDW 17.1* 08/26/2012 1300   LYMPHSABS 1.4 06/02/2014 1128   LYMPHSABS 1.4 08/26/2012 1300   MONOABS 0.9 06/02/2014 1128   MONOABS 0.3 08/26/2012 1300   EOSABS 0.0 06/02/2014 1128   EOSABS 0.1 08/26/2012 1300   BASOSABS 0.0 06/02/2014 1128   BASOSABS 0.0 08/26/2012 1300    BMET    Component Value Date/Time   NA 135 09/06/2014 0454   NA 138 08/26/2012 1300   K 3.3* 09/06/2014 0454   K 4.0 08/26/2012 1300   CL 101 09/06/2014 0454   CL 108* 08/26/2012 1300   CO2 26 09/06/2014 0454   CO2 24 08/26/2012 1300   GLUCOSE 110* 09/06/2014 0454   GLUCOSE 83 08/26/2012 1300   BUN <5* 09/06/2014 0454   BUN 29* 08/26/2012 1300   BUN 4 08/19/2009   CREATININE 0.52 09/06/2014 0454   CREATININE 0.61 07/16/2014 0948   CREATININE 0.79 08/26/2012 1300   CALCIUM 8.9 09/06/2014 0454   CALCIUM 9.9 08/26/2012 1300   GFRNONAA >60 09/06/2014 0454   GFRNONAA >89 07/16/2014 0948   GFRNONAA >60 08/26/2012 1300   GFRAA >60 09/06/2014 0454   GFRAA >89 07/16/2014 0948   GFRAA >60 08/26/2012 1300    INR    Component Value Date/Time   INR 1.04 08/26/2014 1600     Intake/Output Summary (Last 24 hours) at 09/06/14 0719 Last data filed at 09/06/14 0547  Gross per 24 hour  Intake    460 ml  Output   1150 ml  Net   -690 ml     Assessment:  69 y.o. female is s/p:  Right external iliac and common femoral endarterectomy and Dacron patch angioplasty, right to left femorofemoral bypass with 8 mm Hemashield graft   2 Days Post-Op  Plan: -pt doing well this am with audible doppler signals bilateral DP signals -pt ambulated in hallways yesterday-continue to increase ambulation and out of bed -left heel sore-elevate heels as ordered yesterday -foley removed this am -DVT prophylaxis:  Lovenox   Leontine Locket, PA-C [facility] [phone] 09/06/2014 7:19 AM  Addendum  I have independently interviewed and examined the patient, and I agree with the physician assistant's findings.  Probably home in the next 1-2 days.  Pain control spotty, likely due to tolerance from prior narcotic use as part of her PUD surgery.  Will increased dosage and reduce time between dosing.  Adele Barthel, MD [facility] [place] Office: [phone] Pager: [phone]  09/06/2014, 9:07 AM

## 2014-09-06 NOTE — Progress Notes (Signed)
Physical Therapy Treatment Patient Details Name: Cynthia Bowen MRN: [id number] DOB: 23-Jul-1945 Today's Date: 09/06/2014    History of Present Illness Pt admitted with bil acute lower extremity arterial insufficiency. Pt s/pR external iliac and common femoral endarterectomy and Dacron pathc angioplasty, right to left femorofemoral bypass with graft.     PT Comments    Pt progressing towards physical therapy goals. Was able to demonstrate improved gait pattern and distance without complaints of increased pain or fatigue. Pt was premedicated and overall showed a good rehab effort. Recommending HHPT at d/c for continued strengthening and to improve tolerance for functional activity. Will continue to follow.   Follow Up Recommendations  Home health PT;Supervision - Intermittent     Equipment Recommendations  Other (comment) (rollator (pt was given RW but wants rollator instead))    Recommendations for Other Services       Precautions / Restrictions Precautions Precautions: Fall Restrictions Weight Bearing Restrictions: No    Mobility  Bed Mobility Overal bed mobility: Modified Independent             General bed mobility comments: pt able to transfer self to EOB with HOB elevated without difficulty  Transfers Overall transfer level: Needs assistance Equipment used: Rolling walker (2 wheeled) Transfers: Sit to/from Stand Sit to Stand: Supervision         General transfer comment: v/c's for safe hand placement  Ambulation/Gait Ambulation/Gait assistance: Supervision Ambulation Distance (Feet): 300 Feet Assistive device: Rolling walker (2 wheeled) Gait Pattern/deviations: Step-through pattern;Decreased stride length;Trunk flexed Gait velocity: decreased Gait velocity interpretation: Below normal speed for age/gender General Gait Details: VC's for less UE WB on walker, and pt able to make corrective changes. Overall ambulating well with occasional guarding during  turns.    Stairs Stairs:  (Pt declined stair training. States has a threshold to enter)          Engineer, building services Rankin (Stroke Patients Only)       Balance Overall balance assessment: No apparent balance deficits (not formally assessed)                                  Cognition Arousal/Alertness: Awake/alert Behavior During Therapy: WFL for tasks assessed/performed Overall Cognitive Status: Within Functional Limits for tasks assessed                      Exercises General Exercises - Lower Extremity Ankle Circles/Pumps: 10 reps;Both Long Arc Quad: 15 reps;Both Hip ABduction/ADduction: 5 reps;Right;10 reps;Left    General Comments        Pertinent Vitals/Pain Pain Assessment: 0-10 Pain Score: 8  Pain Location: bilat groin, hips Pain Descriptors / Indicators: Operative site guarding;Throbbing Pain Intervention(s): Limited activity within patient's tolerance;Monitored during session;Repositioned    Home Living                      Prior Function            PT Goals (current goals can now be found in the care plan section) Acute Rehab PT Goals Patient Stated Goal: Home today PT Goal Formulation: With patient Time For Goal Achievement: 09/12/14 Progress towards PT goals: Progressing toward goals    Frequency  Min 3X/week    PT Plan Current plan remains appropriate    Co-evaluation             End of Session  Equipment Utilized During Treatment: Gait belt Activity Tolerance: Patient tolerated treatment well Patient left: in chair;with call bell/phone within reach     Time: 0815-0840 PT Time Calculation (min) (ACUTE ONLY): 25 min  Charges:  $Gait Training: 8-22 mins $Therapeutic Exercise: 8-22 mins                    G Codes:      Rolinda Roan October 02, 2014, 9:01 AM   Rolinda Roan, PT, DPT Acute Rehabilitation Services Pager: [phone]

## 2014-09-06 NOTE — Progress Notes (Signed)
Initial Nutrition Assessment  DOCUMENTATION CODES:  Underweight, Severe malnutrition in context of chronic illness  INTERVENTION:  Boost Breeze  NUTRITION DIAGNOSIS:  Malnutrition related to chronic illness as evidenced by severe depletion of body fat, severe depletion of muscle mass.   GOAL:  Patient will meet greater than or equal to 90% of their needs   MONITOR:  PO intake, Supplement acceptance, Labs, Weight trends  REASON FOR ASSESSMENT:  Malnutrition Screening Tool    ASSESSMENT:  Pt with hx of COPD and partial gastrectomy in 2014 who recently had stenting 08/26/14 for severe lower extremity ischemia.  Pt returned for bilateral lower extremity discomfort now s/p femorofemoral bypass.  Per pt she has lost a lot of weight since 2014 and has trouble gaining weight.  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 She reports intolerance to ensure in the past.  Pt recently was told not to drive. She has a friend that takes her out to eat if she does not prepare something at home.   Nutrition-Focused physical exam completed. Findings are severe fat depletion, severe muscle depletion, and no edema.   Labs reviewed:  Potassium low Medications reviewed and include: colace, MVI, miralax, vitamin D Per pt she has constipation but treats this with miralax.   Pt ate 85% of her Breakfast this am. Lunch was just delivered.   Height:  Ht Readings from Last 1 Encounters:  09/05/14 5\' 5"  (1.651 m)    Weight:  Wt Readings from Last 1 Encounters:  09/05/14 82 lb 10.8 oz (37.5 kg)    Ideal Body Weight:  56.8 kg  Wt Readings from Last 10 Encounters:  09/05/14 82 lb 10.8 oz (37.5 kg)  09/03/14 81 lb (36.741 kg)  08/26/14 81 lb (36.741 kg)  08/25/14 81 lb 9.1 oz (37 kg)  08/13/14 80 lb (36.288 kg)  07/16/14 82 lb 9.6 oz (37.467 kg)  06/25/14 86 lb (39.009 kg)  06/08/14 87 lb (39.463 kg)  06/02/14 84 lb 14 oz (38.5 kg)  06/02/14 89 lb  (40.37 kg)    BMI:  Body mass index is 13.76 kg/(m^2).  Estimated Nutritional Needs:  Kcal:  1200-1400  Protein:  65-75 grams  Fluid:  >/= 1.5 L/day  Skin:  Reviewed, no issues  Diet Order:  Diet Carb Modified Fluid consistency:: Thin; Room service appropriate?: Yes  EDUCATION NEEDS:  No education needs identified at this time   Intake/Output Summary (Last 24 hours) at 09/06/14 1158 Last data filed at 09/06/14 0857  Gross per 24 hour  Intake    600 ml  Output   1000 ml  Net   -400 ml    Last BM:  5/14  [place], [place], [place] Pager [phone] After Hours Pager

## 2014-09-06 NOTE — Anesthesia Postprocedure Evaluation (Signed)
Anesthesia Post Note  Patient: Cynthia Bowen  Procedure(s) Performed: Procedure(s) (LRB): Right External Iliac and right comon femoral endarterectomy with patching and right to left BYPASS GRAFT FEMORAL-FEMORAL ARTERY,  (Bilateral)  Anesthesia type: general  Patient location: PACU  Post pain: Pain level controlled  Post assessment: Patient's Cardiovascular Status Stable  Post vital signs: Reviewed and stable  Level of consciousness: sedated  Complications: No apparent anesthesia complications

## 2014-09-06 NOTE — Progress Notes (Signed)
[facility]  Patient Status: Active (receiving services up to time of hospitalization)  [facility] is providing the following services: PT  If patient discharges after hours, please call [phone].   Cynthia Bowen 09/06/2014, 10:41 AM

## 2014-09-07 ENCOUNTER — Inpatient Hospital Stay (HOSPITAL_COMMUNITY): Payer: Medicare Other

## 2014-09-07 LAB — TYPE AND SCREEN
ABO/RH(D): B POS
ANTIBODY SCREEN: NEGATIVE
Unit division: 0
Unit division: 0

## 2014-09-07 NOTE — Progress Notes (Signed)
   09/07/14 1000  Clinical Encounter Type  Visited With Family  Visit Type Initial;Spiritual support;Social support  Referral From Nurse  Spiritual Encounters  Spiritual Needs Emotional  Stress Factors  Patient Stress Factors Exhausted;Health changes   Chaplain was referred to patient via spiritual care consult. Patient was up in a chair when chaplain visited. Patient said she was doing much better than she had been today. Patient said she thought she was going home but unfortunately cannot go home today because of pain. Patient said she lives alone but has many siblings. Most of her siblings are in [place] but she does have a sister in [place]. Patient said that if she needed any further chaplain support she would make sure to let a staff member know.  Cynthia Bowen, Claudius Sis, Chaplain  10:50 AM

## 2014-09-07 NOTE — Progress Notes (Signed)
VASCULAR LAB PRELIMINARY  ARTERIAL  ABI completed:    RIGHT    LEFT    PRESSURE WAVEFORM  PRESSURE WAVEFORM  BRACHIAL 146 triphasic BRACHIAL 136 triphasic  DP   DP    AT 57 Dampened monophasic AT 72 monophasic  PT 61 Severely dampened monophasic PT  absent  PER  absent PER  absent  GREAT TOE  adequate GREAT TOE  inadequate    RIGHT LEFT  ABI 0.42 0.49     Guinevere Ferrari, RVT 09/07/2014, 9:20 AM

## 2014-09-07 NOTE — Progress Notes (Addendum)
Progress Note    09/07/2014 7:29 AM 3 Days Post-Op  Subjective:  C/o pain in right groin the most, but tender over   Tm 99.3 now afebrile 161'W systolic HR 96'E NSR 454% RA  Filed Vitals:   09/07/14 0435  BP: 125/41  Pulse: 65  Temp: 98.5 F (36.9 C)  Resp: 18    Physical Exam: Cardiac:  regular Lungs:  Non labored Incisions:  Bilateral groin incisions are c/d/i Extremities:  Monophasic DP doppler signal right; biphasic doppler signal left; + palpable graft pulse Abdomen:  Soft, tender over graft site  CBC    Component Value Date/Time   WBC 10.1 09/06/2014 0454   WBC 7.5 07/16/2014 0950   WBC 8.2 08/26/2012 1300   RBC 3.73* 09/06/2014 0454   RBC 4.47 07/16/2014 0950   RBC 3.73* 08/26/2012 1300   RBC 2.75* 08/06/2012 0800   HGB 10.8* 09/06/2014 0454   HGB 12.6 07/16/2014 0950   HGB 10.6* 08/26/2012 1300   HCT 32.2* 09/06/2014 0454   HCT 39.9 07/16/2014 0950   HCT 31.2* 08/26/2012 1300   PLT 331 09/06/2014 0454   PLT 516* 08/26/2012 1300   MCV 86.3 09/06/2014 0454   MCV 89.2 07/16/2014 0950   MCV 84 08/26/2012 1300   MCH 29.0 09/06/2014 0454   MCH 28.2 07/16/2014 0950   MCH 28.3 08/26/2012 1300   MCHC 33.5 09/06/2014 0454   MCHC 31.6* 07/16/2014 0950   MCHC 33.9 08/26/2012 1300   RDW 16.3* 09/06/2014 0454   RDW 17.1* 08/26/2012 1300   LYMPHSABS 1.4 06/02/2014 1128   LYMPHSABS 1.4 08/26/2012 1300   MONOABS 0.9 06/02/2014 1128   MONOABS 0.3 08/26/2012 1300   EOSABS 0.0 06/02/2014 1128   EOSABS 0.1 08/26/2012 1300   BASOSABS 0.0 06/02/2014 1128   BASOSABS 0.0 08/26/2012 1300    BMET    Component Value Date/Time   NA 135 09/06/2014 0454   NA 138 08/26/2012 1300   K 3.3* 09/06/2014 0454   K 4.0 08/26/2012 1300   CL 101 09/06/2014 0454   CL 108* 08/26/2012 1300   CO2 26 09/06/2014 0454   CO2 24 08/26/2012 1300   GLUCOSE 110* 09/06/2014 0454   GLUCOSE 83 08/26/2012 1300   BUN <5* 09/06/2014 0454   BUN 29* 08/26/2012 1300   BUN 4 08/19/2009     CREATININE 0.52 09/06/2014 0454   CREATININE 0.61 07/16/2014 0948   CREATININE 0.79 08/26/2012 1300   CALCIUM 8.9 09/06/2014 0454   CALCIUM 9.9 08/26/2012 1300   GFRNONAA >60 09/06/2014 0454   GFRNONAA >89 07/16/2014 0948   GFRNONAA >60 08/26/2012 1300   GFRAA >60 09/06/2014 0454   GFRAA >89 07/16/2014 0948   GFRAA >60 08/26/2012 1300    INR    Component Value Date/Time   INR 1.04 08/26/2014 1600     Intake/Output Summary (Last 24 hours) at 09/07/14 0729 Last data filed at 09/06/14 1700  Gross per 24 hour  Intake    779 ml  Output      0 ml  Net    779 ml     Assessment:  69 y.o. female is s/p:  Right external iliac and common femoral endarterectomy and Dacron patch angioplasty, right to left femorofemoral bypass with 8 mm Hemashield graft   3 Days Post-Op  Plan: -pt doing well today -still having issues with pain control. -most likely home tomorrow -continue mobilization -DVT prophylaxis:  Lovenox   Leontine Locket, PA-C [facility] [phone] 09/07/2014  7:29 AM    Addendum  I have independently interviewed and examined the patient, and I agree with the physician assistant's findings.    Adele Barthel, MD [facility] [place] Office: [phone] Pager: [phone]  09/07/2014, 10:24 AM

## 2014-09-07 NOTE — Progress Notes (Addendum)
Occupational Therapy Treatment Patient Details Name: Cynthia Bowen MRN: [id number] DOB: Feb 15, 1946 Today's Date: 09/07/2014    History of present illness Pt admitted with bil acute lower extremity arterial insufficiency. Pt s/pR external iliac and common femoral endarterectomy and Dacron pathc angioplasty, right to left femorofemoral bypass with graft.    OT comments  Pt moving well. Feel pt is safe to d/c home, from OT standpoint. OT signing off.  Follow Up Recommendations  No OT follow up;Supervision - Intermittent    Equipment Recommendations  3 in 1 bedside comode    Recommendations for Other Services      Precautions / Restrictions Precautions Precautions: Fall Restrictions Weight Bearing Restrictions: No       Mobility Bed Mobility               General bed mobility comments: not assessed  Transfers Overall transfer level: Modified independent                    Balance  No LOB in session-balance not formally assessed.                                 ADL Overall ADL's : Needs assistance/impaired     Grooming: Wash/dry hands;Standing;Independent (all items at sink needed)               Lower Body Dressing: Sitting/lateral leans; Independent (socks on pt at beginning and she was able to reach to don/doff socks- did not fully doff one sock before putting it back on but would be able to do so)   Toilet Transfer: Ambulation;Supervision/safety;Regular Toilet;RW   Toileting- Clothing Manipulation and Hygiene: Independent;Sit to/from stand   Tub/ Shower Transfer: Tub transfer;Supervision/safety;Ambulation;Rolling walker   Functional mobility during ADLs: Supervision/safety;Rolling walker-cues to relax shoulders General ADL Comments: Educated on options for shower chair and discussed two different uses of 3 in 1. Reviewed safety tips such as safe footwear, use of bag on walker, rugs/items on floor and sitting for most of LB ADLs.  Practiced simulated tub transfer again. Recommended someone be with her for tub transfer.      Vision                     Perception     Praxis      Cognition  Awake/Alert Behavior During Therapy: WFL for tasks assessed/performed Overall Cognitive Status: Within Functional Limits for tasks assessed                       Extremity/Trunk Assessment               Exercises     Shoulder Instructions       General Comments      Pertinent Vitals/ Pain       Pain Assessment: 0-10 Pain Score: 8  Pain Location: bilateral groin areas  Pain Descriptors / Indicators: Aching;Tender;Sore Pain Intervention(s): Monitored during session  Home Living                                          Prior Functioning/Environment              Frequency Min 2X/week     Progress Toward Goals  OT Goals(current goals can now be found in the care plan  section)  Progress towards OT goals: Progressing toward goals (ready for d/c from OT)  Acute Rehab OT Goals Patient Stated Goal: get out of here OT Goal Formulation: With patient Time For Goal Achievement: 09/13/14 Potential to Achieve Goals: Good  Plan Other (comment) (ready for d/c from OT)    Co-evaluation                 End of Session Equipment Utilized During Treatment: Rolling walker   Activity Tolerance Patient tolerated treatment well   Patient Left in chair;with call bell/phone within reach   Nurse Communication          Time: 1478-2956 OT Time Calculation (min): 12 min  Charges: OT General Charges $OT Visit: 1 Procedure OT Treatments $Self Care/Home Management : 8-22 mins  Benito Mccreedy OTR/L [phone] 09/07/2014, 11:33 AM

## 2014-09-08 MED ORDER — FENTANYL 25 MCG/HR TD PT72
25.0000 ug | MEDICATED_PATCH | TRANSDERMAL | Status: DC
  Administered 2014-09-08: 25 ug via TRANSDERMAL
  Filled 2014-09-08: qty 1

## 2014-09-08 NOTE — Progress Notes (Signed)
Physical Therapy Treatment Patient Details Name: Cynthia Bowen MRN: [id number] DOB: Oct 13, 1945 Today's Date: 09/08/2014    History of Present Illness Pt admitted with bil acute lower extremity arterial insufficiency. Pt s/pR external iliac and common femoral endarterectomy and Dacron pathc angioplasty, right to left femorofemoral bypass with graft.     PT Comments    Pt progressing towards physical therapy goals. Expresses her disappointment that she is not going home today, however agrees that she needs to stay until her pain is better controlled. Encouraged pt to arrange family/friends to stay with her the afternoon/evening she arrives home at the very least, with family checking on her frequently in following days. Do not feel that pt requires 24 hour assist at this time but would like to make sure she has help initially as she is occasionally unsteady during ambulation with decreased hip and knee flexion. Will continue to follow.   Follow Up Recommendations  Home health PT;Supervision - Intermittent     Equipment Recommendations  Other (comment) ((rollator (pt was given RW but wants rollator instead))    Recommendations for Other Services       Precautions / Restrictions Precautions Precautions: Fall Restrictions Weight Bearing Restrictions: No    Mobility  Bed Mobility Overal bed mobility: Modified Independent                Transfers Overall transfer level: Modified independent Equipment used: None Transfers: Sit to/from Stand Sit to Stand: Supervision         General transfer comment: cue for hand placement when going to sit in chair.  Ambulation/Gait Ambulation/Gait assistance: Supervision Ambulation Distance (Feet): 450 Feet Assistive device: None Gait Pattern/deviations: Decreased stride length;Decreased step length - left;Decreased weight shift to right;Decreased weight shift to left Gait velocity: decreased Gait velocity interpretation: Below  normal speed for age/gender General Gait Details: Pt was able to ambulate without UE support today. Appears slightly unsteady at times however was able to recover independently. No episodes of significant LOB. Pt demonstrates almost a waddling gait pattern with decreased flexion in hips and knees.    Stairs Stairs:  (Pt declined stair training. States has a threshold to enter)          Engineer, building services [place] (Stroke Patients Only)       Balance Overall balance assessment: Needs assistance Sitting-balance support: Feet supported;No upper extremity supported Sitting balance-Leahy Scale: Normal     Standing balance support: No upper extremity supported;During functional activity Standing balance-Leahy Scale: Fair                      Cognition Arousal/Alertness: Awake/alert Behavior During Therapy: WFL for tasks assessed/performed Overall Cognitive Status: Within Functional Limits for tasks assessed                      Exercises      General Comments General comments (skin integrity, edema, etc.): Reviewed HEP; deferred practice due to 9/10 abdominal pain      Pertinent Vitals/Pain Pain Assessment: 0-10 Pain Score: 9  Pain Location: Abdomen Pain Intervention(s): Limited activity within patient's tolerance;Monitored during session;Repositioned    Home Living                      Prior Function            PT Goals (current goals can now be found in the care plan section) Acute Rehab PT Goals Patient Stated  Goal: get out of here PT Goal Formulation: With patient Time For Goal Achievement: 09/12/14 Progress towards PT goals: Progressing toward goals    Frequency  Min 3X/week    PT Plan Current plan remains appropriate    Co-evaluation             End of Session Equipment Utilized During Treatment: Gait belt Activity Tolerance: Patient limited by pain Patient left: in chair;with call bell/phone within  reach     Time: 0810-0835 PT Time Calculation (min) (ACUTE ONLY): 25 min  Charges:  $Gait Training: 8-22 mins $Therapeutic Activity: 8-22 mins                    G Codes:      Rolinda Roan 2014/09/26, 8:43 AM  Rolinda Roan, PT, DPT Acute Rehabilitation Services Pager: [phone]

## 2014-09-08 NOTE — Care Management Note (Signed)
Case Management Note  Patient Details  Name: Cynthia Bowen MRN: [id number] Date of Birth: [birth year]    Expected Discharge Date:  09/09/14               Expected Discharge Plan:  [place]  In-House Referral:     Discharge planning Services  CM Consult  Post Acute Care Choice:  Durable Medical Equipment Choice offered to:  Patient  DME Arranged:  3-N-1 DME Agency:  [place]:  PT [facility] Agency:  [place]  Status of Service:  In process, will continue to follow  Medicare Important Message Given:  Yes Date Medicare IM Given:  09/08/14 Medicare IM give by:  Marvetta Gibbons Date Additional Medicare IM Given:    Additional Medicare Important Message give by:     If discussed at [place] of Stay Meetings, dates discussed:    Additional Comments:  Dawayne Patricia, RN 09/08/2014, 10:36 AM

## 2014-09-08 NOTE — Progress Notes (Addendum)
Progress Note    09/08/2014 7:44 AM 4 Days Post-Op  Subjective:  Crying c/o pain in her groin and swelling in her inner thighs and wants to go home.  No more pain in legs or feet  Tm 99.9 now afebrile HR 70's NSR 706'C-376'E systolic 831% RA  Filed Vitals:   09/08/14 0533  BP: 149/58  Pulse: 75  Temp: 98.6 F (37 C)  Resp: 18    Physical Exam: Lungs:  Non labored Incisions:  Bilateral groin incisions are healing nicely Extremities:  + palpable graft pulse; tender to palpation; swelling in bilateral inner thighs-soft, doesn't appear to be hematoma.   CBC    Component Value Date/Time   WBC 10.1 09/06/2014 0454   WBC 7.5 07/16/2014 0950   WBC 8.2 08/26/2012 1300   RBC 3.73* 09/06/2014 0454   RBC 4.47 07/16/2014 0950   RBC 3.73* 08/26/2012 1300   RBC 2.75* 08/06/2012 0800   HGB 10.8* 09/06/2014 0454   HGB 12.6 07/16/2014 0950   HGB 10.6* 08/26/2012 1300   HCT 32.2* 09/06/2014 0454   HCT 39.9 07/16/2014 0950   HCT 31.2* 08/26/2012 1300   PLT 331 09/06/2014 0454   PLT 516* 08/26/2012 1300   MCV 86.3 09/06/2014 0454   MCV 89.2 07/16/2014 0950   MCV 84 08/26/2012 1300   MCH 29.0 09/06/2014 0454   MCH 28.2 07/16/2014 0950   MCH 28.3 08/26/2012 1300   MCHC 33.5 09/06/2014 0454   MCHC 31.6* 07/16/2014 0950   MCHC 33.9 08/26/2012 1300   RDW 16.3* 09/06/2014 0454   RDW 17.1* 08/26/2012 1300   LYMPHSABS 1.4 06/02/2014 1128   LYMPHSABS 1.4 08/26/2012 1300   MONOABS 0.9 06/02/2014 1128   MONOABS 0.3 08/26/2012 1300   EOSABS 0.0 06/02/2014 1128   EOSABS 0.1 08/26/2012 1300   BASOSABS 0.0 06/02/2014 1128   BASOSABS 0.0 08/26/2012 1300    BMET    Component Value Date/Time   NA 135 09/06/2014 0454   NA 138 08/26/2012 1300   K 3.3* 09/06/2014 0454   K 4.0 08/26/2012 1300   CL 101 09/06/2014 0454   CL 108* 08/26/2012 1300   CO2 26 09/06/2014 0454   CO2 24 08/26/2012 1300   GLUCOSE 110* 09/06/2014 0454   GLUCOSE 83 08/26/2012 1300   BUN <5* 09/06/2014 0454     BUN 29* 08/26/2012 1300   BUN 4 08/19/2009   CREATININE 0.52 09/06/2014 0454   CREATININE 0.61 07/16/2014 0948   CREATININE 0.79 08/26/2012 1300   CALCIUM 8.9 09/06/2014 0454   CALCIUM 9.9 08/26/2012 1300   GFRNONAA >60 09/06/2014 0454   GFRNONAA >89 07/16/2014 0948   GFRNONAA >60 08/26/2012 1300   GFRAA >60 09/06/2014 0454   GFRAA >89 07/16/2014 0948   GFRAA >60 08/26/2012 1300    INR    Component Value Date/Time   INR 1.04 08/26/2014 1600     Intake/Output Summary (Last 24 hours) at 09/08/14 0744 Last data filed at 09/07/14 1817  Gross per 24 hour  Intake    902 ml  Output      0 ml  Net    902 ml   ABI's 09/07/14:  RIGHT    LEFT    PRESSURE WAVEFORM  PRESSURE WAVEFORM  BRACHIAL 146 triphasic BRACHIAL 136 triphasic  DP   DP    AT 57 Dampened monophasic AT 72 monophasic  PT 61 Severely dampened monophasic PT  absent  PER  absent PER  absent  GREAT TOE  adequate  GREAT TOE  inadequate    RIGHT LEFT  ABI 0.42 0.49         Assessment:  69 y.o. female is s/p:  Right external iliac and common femoral endarterectomy and Dacron patch angioplasty, right to left femorofemoral bypass with 8 mm Hemashield graft   4 Days Post-Op  Plan: -pain is not controlled -will add fentanyl patch 25 mg and start off with one percocet once patch is added.  May add 2nd percocet if needed. -DVT prophylaxis:  Lovenox -home when pain better controlled   Leontine Locket, PA-C [facility] [phone] 09/08/2014 7:44 AM   Addendum  I have independently interviewed and examined the patient, and I agree with the physician assistant's findings.  Pt ambulating through hallways without difficulty.  On exam, some fullness near groin incisions, suspect small hematoma related to increased mobilization.  Check CBC tomorrow.  Home once pain regimen better titrated.  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  I would NOT pursue immediately any fem-tib bypass given she is already at high risk of not healing her fem-femoral bypass incisions due to severe protein malnutrition.  Appreciate Nutrition's documentation of severe protein malnutrition.  I have reiterated the importance of adequate intake to assist her body's healing especially in the setting of placement of a prosthetic bypass.  Adele Barthel, MD [facility] [place] Office: [phone] Pager: [phone]  09/08/2014, 10:27 AM

## 2014-09-09 LAB — URINALYSIS, ROUTINE W REFLEX MICROSCOPIC
BILIRUBIN URINE: NEGATIVE
Glucose, UA: NEGATIVE mg/dL
HGB URINE DIPSTICK: NEGATIVE
Ketones, ur: NEGATIVE mg/dL
NITRITE: NEGATIVE
Protein, ur: NEGATIVE mg/dL
SPECIFIC GRAVITY, URINE: 1.016 (ref 1.005–1.030)
Urobilinogen, UA: 1 mg/dL (ref 0.0–1.0)
pH: 6 (ref 5.0–8.0)

## 2014-09-09 LAB — CBC
HEMATOCRIT: 30.6 % — AB (ref 36.0–46.0)
Hemoglobin: 10 g/dL — ABNORMAL LOW (ref 12.0–15.0)
MCH: 28.6 pg (ref 26.0–34.0)
MCHC: 32.7 g/dL (ref 30.0–36.0)
MCV: 87.4 fL (ref 78.0–100.0)
PLATELETS: 456 10*3/uL — AB (ref 150–400)
RBC: 3.5 MIL/uL — ABNORMAL LOW (ref 3.87–5.11)
RDW: 16.3 % — AB (ref 11.5–15.5)
WBC: 7.5 10*3/uL (ref 4.0–10.5)

## 2014-09-09 LAB — URINE MICROSCOPIC-ADD ON

## 2014-09-09 MED ORDER — OXYCODONE-ACETAMINOPHEN 5-325 MG PO TABS: 1.0000 | ORAL_TABLET | Freq: Three times a day (TID) | ORAL | Status: DC | PRN

## 2014-09-09 MED ORDER — FENTANYL 25 MCG/HR TD PT72: 25.0000 ug | MEDICATED_PATCH | TRANSDERMAL | Status: DC

## 2014-09-09 NOTE — Discharge Summary (Signed)
Discharge Summary     Cynthia Bowen 01-07-1946 69 y.o. female  [id number]  Admission Date: 09/04/2014  Discharge Date: 09/09/14  Physician: Conrad Shipman, MD  Admission Diagnosis: PVD (peripheral vascular disease) [I73.9] Abdominal pain [R10.9]   HPI:   This is a 69 y.o. female patient presents to the emergency room this evening with bilateral lower extremity discomfort. She has a complex recent past history regarding her arterial flow. She had severe lower extremity ischemia underwent diagnostic arteriogram at the end of April 2016. 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 She underwent covered stenting of this on 08/26/2014 with Dr. Bridgett Larsson. She had a cutdown to the right groin and percutaneous access to the left groin. She had a left common iliac artery stent laced the same procedure. She was seen yesterday in the office with concern regarding her right groin. She had developed discomfort there and on duplex had unusual flow with questionable suture line disruption. She was scheduled for exploration of this on Monday, 09/06/2014. She presented to the emergency room this evening with left greater than right leg discomfort. She was found to have an audible flow on the left.  Hospital Course:  The patient was admitted to the hospital and taken to the operating room on 09/04/2014 and underwent: Right external iliac and common femoral endarterectomy and Dacron patch angioplasty, right to left femorofemoral bypass with 8 mm Hemashield graft with Dr. Donnetta Hutching.  The pt tolerated the procedure well and was transported to the PACU in good condition.   By POD 1, she ws doing well and transferred to the telemetry floor.  She has had a palpable graft pulse throughout her hospital stay.  Her compartments were soft.   She does have a small sore on her left heel, which has improved over the course of her hospitalization.  The majority of her hospital stay was pain control.  She  finally was placed on a Duragesic patch and one percocet every 4 hours (she was taking 2 percocet every 4 hours) and this has her pain adequately controlled.  Post operative ABI's on 09/07/14  RIGHT    LEFT    PRESSURE WAVEFORM  PRESSURE WAVEFORM  BRACHIAL 146 triphasic BRACHIAL 136 triphasic  DP   DP    AT 57 Dampened monophasic AT 72 monophasic  PT 61 Severely dampened monophasic PT  absent  PER  absent PER  absent  GREAT TOE  adequate GREAT TOE  inadequate    RIGHT LEFT  ABI 0.42 0.49       She is encouraged to keep up her po intake for nutrition for wound healing.  The remainder of the hospital course consisted of increasing mobilization and increasing intake of solids without difficulty.  CBC    Component Value Date/Time   WBC 10.1 09/06/2014 0454   WBC 7.5 07/16/2014 0950   WBC 8.2 08/26/2012 1300   RBC 3.73* 09/06/2014 0454   RBC 4.47 07/16/2014 0950   RBC 3.73* 08/26/2012 1300   RBC 2.75* 08/06/2012 0800   HGB 10.8* 09/06/2014 0454   HGB 12.6 07/16/2014 0950   HGB 10.6* 08/26/2012 1300   HCT 32.2* 09/06/2014 0454   HCT 39.9 07/16/2014 0950   HCT 31.2* 08/26/2012 1300   PLT 331 09/06/2014 0454   PLT 516* 08/26/2012 1300   MCV 86.3 09/06/2014 0454   MCV 89.2 07/16/2014 0950   MCV 84 08/26/2012 1300   MCH 29.0 09/06/2014 0454   MCH 28.2 07/16/2014 0950  MCH 28.3 08/26/2012 1300   MCHC 33.5 09/06/2014 0454   MCHC 31.6* 07/16/2014 0950   MCHC 33.9 08/26/2012 1300   RDW 16.3* 09/06/2014 0454   RDW 17.1* 08/26/2012 1300   LYMPHSABS 1.4 06/02/2014 1128   LYMPHSABS 1.4 08/26/2012 1300   MONOABS 0.9 06/02/2014 1128   MONOABS 0.3 08/26/2012 1300   EOSABS 0.0 06/02/2014 1128   EOSABS 0.1 08/26/2012 1300   BASOSABS 0.0 06/02/2014 1128   BASOSABS 0.0 08/26/2012 1300    BMET    Component Value Date/Time   NA 135 09/06/2014 0454   NA 138 08/26/2012 1300   K 3.3* 09/06/2014 0454   K 4.0 08/26/2012 1300    CL 101 09/06/2014 0454   CL 108* 08/26/2012 1300   CO2 26 09/06/2014 0454   CO2 24 08/26/2012 1300   GLUCOSE 110* 09/06/2014 0454   GLUCOSE 83 08/26/2012 1300   BUN <5* 09/06/2014 0454   BUN 29* 08/26/2012 1300   BUN 4 08/19/2009   CREATININE 0.52 09/06/2014 0454   CREATININE 0.61 07/16/2014 0948   CREATININE 0.79 08/26/2012 1300   CALCIUM 8.9 09/06/2014 0454   CALCIUM 9.9 08/26/2012 1300   GFRNONAA >60 09/06/2014 0454   GFRNONAA >89 07/16/2014 0948   GFRNONAA >60 08/26/2012 1300   GFRAA >60 09/06/2014 0454   GFRAA >89 07/16/2014 0948   GFRAA >60 08/26/2012 1300       Discharge Instructions    Call MD for:  redness, tenderness, or signs of infection (pain, swelling, bleeding, redness, odor or green/yellow discharge around incision site)    Complete by:  As directed      Call MD for:  severe or increased pain, loss or decreased feeling  in affected limb(s)    Complete by:  As directed      Call MD for:  temperature >100.5    Complete by:  As directed      Driving Restrictions    Complete by:  As directed   No driving for 2 weeks     Lifting restrictions    Complete by:  As directed   No lifting for 4 weeks     Resume previous diet    Complete by:  As directed      may wash over wound with mild soap and water    Complete by:  As directed   Wash the groin wound with soap and water daily and pat dry. (No tub bath-only shower)  Then put a dry gauze or washcloth there to keep this area dry daily and as needed.  Do not use Vaseline or neosporin on your incisions.  Only use soap and water on your incisions and then protect and keep dry.           Discharge Diagnosis:  PVD (peripheral vascular disease) [I73.9] Abdominal pain [R10.9]  Secondary Diagnosis: Patient Active Problem List   Diagnosis Date Noted  . Ischemia of lower extremity 09/05/2014  . Visit for wound check 09/03/2014  . Abdominal aortic stenosis 09/03/2014  . PAD (peripheral artery disease) 08/26/2014  .  Discoloration of skin- Left Heel 08/13/2014  . Left foot pain- Heel 08/13/2014  . Leg heaviness-Bilateral 08/13/2014  . Protein-calorie malnutrition, severe 06/04/2014  . Clostridium difficile diarrhea 06/03/2014  . Cellulitis of hand 06/02/2014  . Hypokalemia 06/02/2014  . Osteoporosis 05/22/2014  . Recurrent abdominal pain--multiple causes, status post GI surgery with multiple complications [id number] 62/83/1517  . Abnormal CT scan 02/08/2014  . Atherosclerosis of native arteries of  extremity with intermittent claudication 01/15/2014  . Nephrolithiasis-asymptomatic/noted on scan 10/08/2013  . Other and unspecified ovarian cyst--noted on scans 2014 2015/asymptomatic 10/08/2013  . Pancreatic lesion-6 mm noted on MRI at [facility] 10/08/2013  . 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 Protein calorie malnutrition 01/15/2013  . PE (pulmonary embolism) 08/05/2012  . Incarcerated epigastric hernia s/p primary repair 07/02/2012 07/11/2012  . 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 [id number] 07/10/2012  . Pericardial effusion 04/17/2012  . COPD GOLD I 01/29/2012  . Back pain DDD S/p surgery [place] 1993 12/29/2011  . Depression with anxiety 04/02/2011  . GERD (gastroesophageal reflux disease) 04/02/2011  . Osteoarthritis 04/02/2011  . Cervical neck pain with evidence of disc disease-s/p surgery [id number] 04/02/2011  . Fibromyalgia-[place] 2004 04/02/2011   Past Medical History  Diagnosis Date  . COPD (chronic obstructive pulmonary disease)   . Pneumonia 12-2011  . GERD (gastroesophageal reflux disease)   . Headache(784.0)   . Arthritis     osteoarthritis  . Allergy   . Depression   . Neuromuscular disorder   . Osteoporosis   . Bronchitis     CURRENTLY AS OF 06/30/12 - HAS COUGH AND FINISHED ANTIBIOTIC FOR BRONCHITIS  . Fibromyalgia   . Pain     ABDOMINAL PAIN AND NAUSEA  . Pain     SOMETIMES PAIN RIGHT  EAR AND NECK--STATES CAUSED BY A "LUMP" ON BACK OF EAR--USES KENALOG CREAM TOPICALLY AS NEEDED.  Marland Kitchen Gastrocutaneous fistula   . Anemia   . Anxiety   . Blood transfusion without reported diagnosis   . Heart murmur     young  . Peripheral vascular disease     hx  ?leg  . History of kidney stones        Medication List    STOP taking these medications        fluconazole 150 MG tablet  Commonly known as:  DIFLUCAN     metroNIDAZOLE 500 MG tablet  Commonly known as:  FLAGYL     traMADol 50 MG tablet  Commonly known as:  ULTRAM      TAKE these medications        acetaminophen 500 MG tablet  Commonly known as:  TYLENOL  Take 1,000 mg by mouth daily as needed (pain).     albuterol 108 (90 BASE) MCG/ACT inhaler  Commonly known as:  PROVENTIL HFA;VENTOLIN HFA  Inhale 2 puffs into the lungs every 6 (six) hours as needed for wheezing.     CALCIUM PO  Take 1 tablet by mouth daily.     Carboxymeth-Glycerin-Polysorb 0.5-1-0.5 % Soln  Place 2 drops into both eyes 3 (three) times daily.     clidinium-chlordiazePOXIDE 5-2.5 MG per capsule  Commonly known as:  LIBRAX  TAKE ONE CAPSULE BY MOUTH 4 TIMES A DAY BEFORE MEALS AND AT BEDTIME, AS NEEDED FOR ABDOMINAL PAIN     dicyclomine 20 MG tablet  Commonly known as:  BENTYL  Take 1 tablet (20 mg total) by mouth 4 (four) times daily -  before meals and at bedtime.     diphenhydrAMINE 12.5 MG/5ML liquid  Commonly known as:  BENADRYL  Take 25 mg by mouth 4 (four) times daily as needed for itching or allergies.     EPINEPHrine 0.3 mg/0.3 mL Soaj injection  Commonly known as:  EPIPEN  Inject 0.3 mLs (0.3 mg total) into the muscle daily as needed (anaphylaxis).     feeding supplement (RESOURCE  BREEZE) Liqd  Take 1 Container by mouth 3 (three) times daily between meals.     fentaNYL 25 MCG/HR patch  Commonly known as:  DURAGESIC - dosed mcg/hr  Place 1 patch (25 mcg total) onto the skin every 3 (three) days.     fluocinonide cream  0.05 %  Commonly known as:  LIDEX  Apply 1 application topically 2 (two) times daily as needed (rash).     GAS-X EXTRA STRENGTH PO  Take 1 tablet by mouth 3 (three) times daily after meals.     ketoconazole 2 % shampoo  Commonly known as:  NIZORAL  Apply 1 application topically 2 (two) times a week.     ketotifen 0.025 % ophthalmic solution  Commonly known as:  ZADITOR  Place 1 drop into both eyes 2 (two) times daily.     multivitamin with minerals Tabs tablet  Take 1 tablet by mouth daily.     omeprazole 40 MG capsule  Commonly known as:  PRILOSEC  Take 1 capsule (40 mg total) by mouth daily.     ondansetron 4 MG tablet  Commonly known as:  ZOFRAN  TAKE 1 TABLET (4 MG TOTAL) BY MOUTH EVERY 8 (EIGHT) HOURS AS NEEDED FOR NAUSEA OR VOMITING.     oxyCODONE-acetaminophen 5-325 MG per tablet  Commonly known as:  ROXICET  Take 1 tablet by mouth every 8 (eight) hours as needed for severe pain.     polyethylene glycol powder powder  Commonly known as:  GLYCOLAX/MIRALAX  Take 17 g by mouth daily.     potassium chloride SA 20 MEQ tablet  Commonly known as:  K-DUR,KLOR-CON  Take 1 tablet BID for 2 days, Take 1 tablet once a day for 4 days     predniSONE 10 MG tablet  Commonly known as:  DELTASONE  Take 3 tabs daily 3 days, then 2 tabs daily 3 days, then 1 tab daily 3 days, then stop.     PROBIOTIC DAILY PO  Take 1 tablet by mouth daily.     ranitidine 150 MG tablet  Commonly known as:  ZANTAC  Take 1 tablet (150 mg total) by mouth 2 (two) times daily.     saccharomyces boulardii 250 MG capsule  Commonly known as:  FLORASTOR  Take 1 capsule (250 mg total) by mouth 2 (two) times daily.     sucralfate 1 G tablet  Commonly known as:  CARAFATE  Take 1 tablet (1 g total) by mouth 4 (four) times daily -  with meals and at bedtime.     Vitamin D (Ergocalciferol) 50000 UNITS Caps capsule  Commonly known as:  DRISDOL  Take 1 capsule (50,000 Units total) by mouth every 7  (seven) days.        Prescriptions given: 1.  Duragesic Patches #5 No Refill 2.  Percocet 5/325 mg one every 8 hrs prn pain #30 (thirty) NR  Instructions: 1.  Wash the groin wound with soap and water daily and pat dry. (No tub bath-only shower)  Then put a dry gauze or washcloth there to keep this area dry daily and as needed.  Do not use Vaseline or neosporin on your incisions.  Only use soap and water on your incisions and then protect and keep dry.  Disposition: home  Patient's condition: is Good  Follow up: 1. Dr. Bridgett Larsson in 09/22/14 weeks   Leontine Locket, PA-C [facility] [phone] 09/09/2014  7:40 AM   Addendum  I have independently interviewed  and examined the patient, and I agree with the physician assistant's discharge summary.    Adele Barthel, MD [facility] [place] Office: [phone] Pager: [phone]  09/16/2014, 2:04 PM   - For VQI Registry use --- Instructions: Press F2 to tab through selections.  Delete question if not applicable.   Post-op:  Wound infection: No  Graft infection: No  Transfusion: No  If yes, n/a units given New Arrhythmia: No Ipsilateral amputation: No, [ ]  Minor, [ ]  BKA, [ ]  AKA Discharge patency: [ x] Primary, [ ]  Primary assisted, [ ]  Secondary, [ ]  Occluded Patency judged by: [ ]  Dopper only, [x ] Palpable graft pulse, [ ]  Palpable distal pulse, [ ]  ABI inc. > 0.15, [ ]  Duplex Discharge ABI: R 0.42, L 0.49 Discharge TBI: R , L  D/C Ambulatory Status: Ambulatory  Complications: MI: No, [ ]  Troponin only, [ ]  EKG or Clinical CHF: No Resp failure:No, [ ]  Pneumonia, [ ]  Ventilator Chg in renal function: No, [ ]  Inc. Cr > 0.5, [ ]  Temp. Dialysis, [ ]  Permanent dialysis Stroke: No, [ ]  Minor, [ ]  Major Return to OR: No  Reason for return to OR: [ ]  Bleeding, [ ]  Infection, [ ]  Thrombosis, [ ]  Revision  Discharge medications: Statin use:  no ASA use:  no Plavix use:  no Beta  blocker use: no Coumadin use: no

## 2014-09-09 NOTE — Progress Notes (Addendum)
Progress Note    09/09/2014 7:28 AM 5 Days Post-Op  Subjective:  Pain better controlled this morning-with the Duragesic patch, she is only requiring 1 percocet.  States she still has pain, but not near like it was.  States "we have to endure some pain". Sore on heel feeling better too.  States her legs don't ache anymore.  Tm 99.7 now afebrile HR 70's NSR 256'L-893'T systolic 342% RA  Filed Vitals:   09/09/14 0546  BP: 118/54  Pulse: 65  Temp: 98.1 F (36.7 C)  Resp: 18    Physical Exam: Cardiac:  regular Lungs:  Non labored Incisions:  Bilateral groin incisions healing nicely   CBC    Component Value Date/Time   WBC 10.1 09/06/2014 0454   WBC 7.5 07/16/2014 0950   WBC 8.2 08/26/2012 1300   RBC 3.73* 09/06/2014 0454   RBC 4.47 07/16/2014 0950   RBC 3.73* 08/26/2012 1300   RBC 2.75* 08/06/2012 0800   HGB 10.8* 09/06/2014 0454   HGB 12.6 07/16/2014 0950   HGB 10.6* 08/26/2012 1300   HCT 32.2* 09/06/2014 0454   HCT 39.9 07/16/2014 0950   HCT 31.2* 08/26/2012 1300   PLT 331 09/06/2014 0454   PLT 516* 08/26/2012 1300   MCV 86.3 09/06/2014 0454   MCV 89.2 07/16/2014 0950   MCV 84 08/26/2012 1300   MCH 29.0 09/06/2014 0454   MCH 28.2 07/16/2014 0950   MCH 28.3 08/26/2012 1300   MCHC 33.5 09/06/2014 0454   MCHC 31.6* 07/16/2014 0950   MCHC 33.9 08/26/2012 1300   RDW 16.3* 09/06/2014 0454   RDW 17.1* 08/26/2012 1300   LYMPHSABS 1.4 06/02/2014 1128   LYMPHSABS 1.4 08/26/2012 1300   MONOABS 0.9 06/02/2014 1128   MONOABS 0.3 08/26/2012 1300   EOSABS 0.0 06/02/2014 1128   EOSABS 0.1 08/26/2012 1300   BASOSABS 0.0 06/02/2014 1128   BASOSABS 0.0 08/26/2012 1300    BMET    Component Value Date/Time   NA 135 09/06/2014 0454   NA 138 08/26/2012 1300   K 3.3* 09/06/2014 0454   K 4.0 08/26/2012 1300   CL 101 09/06/2014 0454   CL 108* 08/26/2012 1300   CO2 26 09/06/2014 0454   CO2 24 08/26/2012 1300   GLUCOSE 110* 09/06/2014 0454   GLUCOSE 83 08/26/2012  1300   BUN <5* 09/06/2014 0454   BUN 29* 08/26/2012 1300   BUN 4 08/19/2009   CREATININE 0.52 09/06/2014 0454   CREATININE 0.61 07/16/2014 0948   CREATININE 0.79 08/26/2012 1300   CALCIUM 8.9 09/06/2014 0454   CALCIUM 9.9 08/26/2012 1300   GFRNONAA >60 09/06/2014 0454   GFRNONAA >89 07/16/2014 0948   GFRNONAA >60 08/26/2012 1300   GFRAA >60 09/06/2014 0454   GFRAA >89 07/16/2014 0948   GFRAA >60 08/26/2012 1300    INR    Component Value Date/Time   INR 1.04 08/26/2014 1600     Intake/Output Summary (Last 24 hours) at 09/09/14 0728 Last data filed at 09/08/14 1806  Gross per 24 hour  Intake    720 ml  Output      0 ml  Net    720 ml     Assessment:  70 y.o. female is s/p:  Right external iliac and common femoral endarterectomy and Dacron patch angioplasty, right to left femorofemoral bypass with 8 mm Hemashield graft  5 Days Post-Op  Plan: -pt doing much better this morning.  She is getting pain relief with the patch and taking only  one percocet now and pain is better controlled. -CBC attempted to be drawn this morning, however, they could not get blood.  May draw H/H instead to ck hematocrit.  -DVT prophylaxis:  Lovenox -home today-f/u with Dr. Bridgett Larsson in 2 weeks.   Leontine Locket, PA-C [facility] [phone] 09/09/2014 7:28 AM    Addendum  I have independently interviewed and examined the patient, and I agree with the physician assistant's findings.  Check CBC and UA.  If ok, D/C today.  If UA +, start short course of abx.  Adele Barthel, MD [facility] [place] Office: [phone] Pager: [phone]  09/09/2014, 7:52 AM

## 2014-09-09 NOTE — Progress Notes (Signed)
RN bedside to speak to pt. Pt is completely dressed, pt inquiring what time would she be discharged. Educated pt that the MD has ordered additional testing to be completed before discharge (CBC, UA). Advised pt I will educate her when discharge orders are received.

## 2014-09-09 NOTE — Discharge Summary (Signed)
Conrad Kensington, MD Physician Signed Vascular Surgery Discharge Planning 09/09/2014 1:10 PM    Expand All Collapse All   Physician Discharge Summary  Patient ID: INIOLUWA BOULAY MRN: [id number] DOB/AGE: 1945/06/08 69 y.o.  Admit date: 09/04/2014 Discharge date: 09/09/2014  Admission Diagnosis: Acute bilateral limb ischemia  Discharge Diagnoses:  Acute bilateral limb ischemia  Secondary Diagnoses: Past Medical History  Diagnosis Date  . COPD (chronic obstructive pulmonary disease)   . Pneumonia 12-2011  . GERD (gastroesophageal reflux disease)   . Headache(784.0)   . Arthritis     osteoarthritis  . Allergy   . Depression   . Neuromuscular disorder   . Osteoporosis   . Bronchitis     CURRENTLY AS OF 06/30/12 - HAS COUGH AND FINISHED ANTIBIOTIC FOR BRONCHITIS  . Fibromyalgia   . Pain     ABDOMINAL PAIN AND NAUSEA  . Pain     SOMETIMES PAIN RIGHT EAR AND NECK--STATES CAUSED BY A "LUMP" ON BACK OF EAR--USES KENALOG CREAM TOPICALLY AS NEEDED.  Marland Kitchen Gastrocutaneous fistula   . Anemia   . Anxiety   . Blood transfusion without reported diagnosis   . Heart murmur     young  . Peripheral vascular disease     hx ?leg  . History of kidney stones     Procedures (09/05/14) 1. R iliofemoral endarterectomy with dacron patch angioplasty 2. R to L fem-fem BPG  Discharged Condition: good  Hospital Course:  PERNELLA ACKERLEY is a 69 y.o. female S/p recent aortic stenting and left common iliac artery stenting presented with bilateral leg ischemia. Patient was seen in the clinic 1-2 days prior to presenting in the ED on 09/04/14 with bilateral leg pain. He exam was consistent with acute limb ischemia. Dr. Donnetta Hutching brought the patient back to the operating room for a right iliofemoral endarterectomy with dacron patch angioplasty with R to L fem-fem BPG. The patient post-operative course was unremarkable except for a  2 unit transfusion of pRBC. She rapidly regained mobility but had some difficulty with pain control felt due to tolerance to narcotics from her recent GI operations. She was discharged home with follow-up in ~2 week for wound checks.  Consults 1. PT 2. OT  Significant Diagnostic Studies: CBC  Labs (Brief)       Component Value Date/Time   WBC 7.5 09/09/2014 0810   WBC 7.5 07/16/2014 0950   WBC 8.2 08/26/2012 1300   RBC 3.50* 09/09/2014 0810   RBC 4.47 07/16/2014 0950   RBC 3.73* 08/26/2012 1300   RBC 2.75* 08/06/2012 0800   HGB 10.0* 09/09/2014 0810   HGB 12.6 07/16/2014 0950   HGB 10.6* 08/26/2012 1300   HCT 30.6* 09/09/2014 0810   HCT 39.9 07/16/2014 0950   HCT 31.2* 08/26/2012 1300   PLT 456* 09/09/2014 0810   PLT 516* 08/26/2012 1300   MCV 87.4 09/09/2014 0810   MCV 89.2 07/16/2014 0950   MCV 84 08/26/2012 1300   MCH 28.6 09/09/2014 0810   MCH 28.2 07/16/2014 0950   MCH 28.3 08/26/2012 1300   MCHC 32.7 09/09/2014 0810   MCHC 31.6* 07/16/2014 0950   MCHC 33.9 08/26/2012 1300   RDW 16.3* 09/09/2014 0810   RDW 17.1* 08/26/2012 1300   LYMPHSABS 1.4 06/02/2014 1128   LYMPHSABS 1.4 08/26/2012 1300   MONOABS 0.9 06/02/2014 1128   MONOABS 0.3 08/26/2012 1300   EOSABS 0.0 06/02/2014 1128   EOSABS 0.1 08/26/2012 1300   BASOSABS 0.0 06/02/2014 1128   BASOSABS  0.0 08/26/2012 1300      BMET  Labs (Brief)       Component Value Date/Time   NA 135 09/06/2014 0454   NA 138 08/26/2012 1300   K 3.3* 09/06/2014 0454   K 4.0 08/26/2012 1300   CL 101 09/06/2014 0454   CL 108* 08/26/2012 1300   CO2 26 09/06/2014 0454   CO2 24 08/26/2012 1300   GLUCOSE 110* 09/06/2014 0454   GLUCOSE 83 08/26/2012 1300   BUN <5* 09/06/2014 0454   BUN 29* 08/26/2012 1300   BUN 4 08/19/2009    CREATININE 0.52 09/06/2014 0454   CREATININE 0.61 07/16/2014 0948   CREATININE 0.79 08/26/2012 1300   CALCIUM 8.9 09/06/2014 0454   CALCIUM 9.9 08/26/2012 1300   GFRNONAA >60 09/06/2014 0454   GFRNONAA >89 07/16/2014 0948   GFRNONAA >60 08/26/2012 1300   GFRAA >60 09/06/2014 0454   GFRAA >89 07/16/2014 0948   GFRAA >60 08/26/2012 1300      COAG  Recent Labs    Lab Results  Component Value Date   INR 1.04 08/26/2014   INR 1.04 08/25/2014   INR 1.85* 02/21/2013      Recent Labs    No results found for: PTT    Disposition: 01-Home or Self Care  Discharge Instructions    Call MD for: redness, tenderness, or signs of infection (pain, swelling, bleeding, redness, odor or green/yellow discharge around incision site)  Complete by: As directed      Call MD for: severe or increased pain, loss or decreased feeling in affected limb(s)  Complete by: As directed      Call MD for: temperature >100.5  Complete by: As directed      Driving Restrictions  Complete by: As directed   No driving for 2 weeks     Lifting restrictions  Complete by: As directed   No lifting for 4 weeks     Resume previous diet  Complete by: As directed      may wash over wound with mild soap and water   Complete by: As directed   Wash the groin wound with soap and water daily and pat dry. (No tub bath-only shower) Then put a dry gauze or washcloth there to keep this area dry daily and as needed. Do not use Vaseline or neosporin on your incisions. Only use soap and water on your incisions and then protect and keep dry.            Medication List    STOP taking these medications       fluconazole 150 MG tablet  Commonly known as: DIFLUCAN     metroNIDAZOLE 500 MG tablet  Commonly known as: FLAGYL     traMADol 50 MG tablet  Commonly known as: ULTRAM       TAKE these medications       acetaminophen 500 MG tablet  Commonly known as: TYLENOL  Take 1,000 mg by mouth daily as needed (pain).     albuterol 108 (90 BASE) MCG/ACT inhaler  Commonly known as: PROVENTIL HFA;VENTOLIN HFA  Inhale 2 puffs into the lungs every 6 (six) hours as needed for wheezing.     CALCIUM PO  Take 1 tablet by mouth daily.     Carboxymeth-Glycerin-Polysorb 0.5-1-0.5 % Soln  Place 2 drops into both eyes 3 (three) times daily.     clidinium-chlordiazePOXIDE 5-2.5 MG per capsule  Commonly known as: LIBRAX  TAKE ONE CAPSULE BY MOUTH 4 TIMES A  DAY BEFORE MEALS AND AT BEDTIME, AS NEEDED FOR ABDOMINAL PAIN     dicyclomine 20 MG tablet  Commonly known as: BENTYL  Take 1 tablet (20 mg total) by mouth 4 (four) times daily - before meals and at bedtime.     diphenhydrAMINE 12.5 MG/5ML liquid  Commonly known as: BENADRYL  Take 25 mg by mouth 4 (four) times daily as needed for itching or allergies.     EPINEPHrine 0.3 mg/0.3 mL Soaj injection  Commonly known as: EPIPEN  Inject 0.3 mLs (0.3 mg total) into the muscle daily as needed (anaphylaxis).     feeding supplement (RESOURCE BREEZE) Liqd  Take 1 Container by mouth 3 (three) times daily between meals.     fentaNYL 25 MCG/HR patch  Commonly known as: DURAGESIC - dosed mcg/hr  Place 1 patch (25 mcg total) onto the skin every 3 (three) days.     fluocinonide cream 0.05 %  Commonly known as: LIDEX  Apply 1 application topically 2 (two) times daily as needed (rash).     GAS-X EXTRA STRENGTH PO  Take 1 tablet by mouth 3 (three) times daily after meals.     ketoconazole 2 % shampoo  Commonly known as: NIZORAL  Apply 1 application topically 2 (two) times a week.     ketotifen 0.025 % ophthalmic solution  Commonly known as: ZADITOR  Place 1 drop into both eyes 2 (two) times daily.     multivitamin with minerals Tabs  tablet  Take 1 tablet by mouth daily.     omeprazole 40 MG capsule  Commonly known as: PRILOSEC  Take 1 capsule (40 mg total) by mouth daily.     ondansetron 4 MG tablet  Commonly known as: ZOFRAN  TAKE 1 TABLET (4 MG TOTAL) BY MOUTH EVERY 8 (EIGHT) HOURS AS NEEDED FOR NAUSEA OR VOMITING.     oxyCODONE-acetaminophen 5-325 MG per tablet  Commonly known as: ROXICET  Take 1 tablet by mouth every 8 (eight) hours as needed for severe pain.     polyethylene glycol powder powder  Commonly known as: GLYCOLAX/MIRALAX  Take 17 g by mouth daily.     potassium chloride SA 20 MEQ tablet  Commonly known as: K-DUR,KLOR-CON  Take 1 tablet BID for 2 days, Take 1 tablet once a day for 4 days     predniSONE 10 MG tablet  Commonly known as: DELTASONE  Take 3 tabs daily 3 days, then 2 tabs daily 3 days, then 1 tab daily 3 days, then stop.     PROBIOTIC DAILY PO  Take 1 tablet by mouth daily.     ranitidine 150 MG tablet  Commonly known as: ZANTAC  Take 1 tablet (150 mg total) by mouth 2 (two) times daily.     saccharomyces boulardii 250 MG capsule  Commonly known as: FLORASTOR  Take 1 capsule (250 mg total) by mouth 2 (two) times daily.     sucralfate 1 G tablet  Commonly known as: CARAFATE  Take 1 tablet (1 g total) by mouth 4 (four) times daily - with meals and at bedtime.     Vitamin D (Ergocalciferol) 50000 UNITS Caps capsule  Commonly known as: DRISDOL  Take 1 capsule (50,000 Units total) by mouth every 7 (seven) days.           Follow-up Information    Follow up with [facility].   Why: resume HH-PT   Contact information:   [address] [place] [address]  [phone]       Follow up with [facility].   Why: 3n1- to be delivered to room prior to discharge   Contact information:   [address] [place]  [address] [phone]       Signed: Adele Barthel 09/09/2014, 1:11 PM

## 2014-09-09 NOTE — Care Management Note (Signed)
Case Management Note  Patient Details  Name: Cynthia Bowen MRN: [id number] Date of Birth: 07/13/1945    Expected Discharge Date:  09/09/14               Expected Discharge Plan:  [place] [place]  In-House Referral:     Discharge planning Services  CM Consult  Post Acute Care Choice:  Durable Medical Equipment Choice offered to:  Patient  DME Arranged:  3-N-1 DME Agency:  [facility]:  PT [facility] [place] Agency:  [place]  Status of Service:  Completed, signed off  Medicare Important Message Given:  Yes Date Medicare IM Given:  09/08/14 Medicare IM give by:  Marvetta Gibbons Date Additional Medicare IM Given:    Additional Medicare Important Message give by:     If discussed at [place] of Stay Meetings, dates discussed:    Additional Comments: Notified Jermaine with [facility] for 3n1 need- to be delivered to room prior to discharge- also alerted Lelan Pons with [facility] of pt's discharge for today  Dawayne Patricia, RN 09/09/2014, 2:17 PM

## 2014-09-09 NOTE — Discharge Planning (Deleted)
Physician Discharge Summary  Patient ID: Cynthia Bowen MRN: [id number] DOB/AGE: Oct 09, 1945 69 y.o.  Admit date: 09/04/2014 Discharge date: 09/09/2014  Admission Diagnosis: Acute bilateral limb ischemia  Discharge Diagnoses:  Acute bilateral limb ischemia  Secondary Diagnoses: Past Medical History  Diagnosis Date  . COPD (chronic obstructive pulmonary disease)   . Pneumonia 12-2011  . GERD (gastroesophageal reflux disease)   . Headache(784.0)   . Arthritis     osteoarthritis  . Allergy   . Depression   . Neuromuscular disorder   . Osteoporosis   . Bronchitis     CURRENTLY AS OF 06/30/12 - HAS COUGH AND FINISHED ANTIBIOTIC FOR BRONCHITIS  . Fibromyalgia   . Pain     ABDOMINAL PAIN AND NAUSEA  . Pain     SOMETIMES PAIN RIGHT EAR AND NECK--STATES CAUSED BY A "LUMP" ON BACK OF EAR--USES KENALOG CREAM TOPICALLY AS NEEDED.  Marland Kitchen Gastrocutaneous fistula   . Anemia   . Anxiety   . Blood transfusion without reported diagnosis   . Heart murmur     young  . Peripheral vascular disease     hx  ?leg  . History of kidney stones     Procedures (09/05/14) 1. R iliofemoral endarterectomy with dacron patch angioplasty 2. R to L fem-fem BPG  Discharged Condition: good  Hospital Course:  Cynthia Bowen is a 69 y.o. female  S/p recent aortic stenting and left common iliac artery stenting presented with bilateral leg ischemia.  Patient was seen in the clinic 1-2 days prior to presenting in the ED on 09/04/14 with bilateral leg pain.  He exam was consistent with acute limb ischemia.  Dr. Donnetta Hutching brought the patient back to the operating room for a right iliofemoral endarterectomy with dacron patch angioplasty with R to L fem-fem BPG.  The patient post-operative course was unremarkable except for a 2 unit transfusion of pRBC.  She rapidly regained mobility but had some difficulty with pain control felt due to tolerance to narcotics from her recent GI operations.  She was discharged home with  follow-up in ~2 week for wound checks.  Consults 1. PT 2. OT  Significant Diagnostic Studies: CBC    Component Value Date/Time   WBC 7.5 09/09/2014 0810   WBC 7.5 07/16/2014 0950   WBC 8.2 08/26/2012 1300   RBC 3.50* 09/09/2014 0810   RBC 4.47 07/16/2014 0950   RBC 3.73* 08/26/2012 1300   RBC 2.75* 08/06/2012 0800   HGB 10.0* 09/09/2014 0810   HGB 12.6 07/16/2014 0950   HGB 10.6* 08/26/2012 1300   HCT 30.6* 09/09/2014 0810   HCT 39.9 07/16/2014 0950   HCT 31.2* 08/26/2012 1300   PLT 456* 09/09/2014 0810   PLT 516* 08/26/2012 1300   MCV 87.4 09/09/2014 0810   MCV 89.2 07/16/2014 0950   MCV 84 08/26/2012 1300   MCH 28.6 09/09/2014 0810   MCH 28.2 07/16/2014 0950   MCH 28.3 08/26/2012 1300   MCHC 32.7 09/09/2014 0810   MCHC 31.6* 07/16/2014 0950   MCHC 33.9 08/26/2012 1300   RDW 16.3* 09/09/2014 0810   RDW 17.1* 08/26/2012 1300   LYMPHSABS 1.4 06/02/2014 1128   LYMPHSABS 1.4 08/26/2012 1300   MONOABS 0.9 06/02/2014 1128   MONOABS 0.3 08/26/2012 1300   EOSABS 0.0 06/02/2014 1128   EOSABS 0.1 08/26/2012 1300   BASOSABS 0.0 06/02/2014 1128   BASOSABS 0.0 08/26/2012 1300    BMET    Component Value Date/Time   NA 135 09/06/2014 0454  NA 138 08/26/2012 1300   K 3.3* 09/06/2014 0454   K 4.0 08/26/2012 1300   CL 101 09/06/2014 0454   CL 108* 08/26/2012 1300   CO2 26 09/06/2014 0454   CO2 24 08/26/2012 1300   GLUCOSE 110* 09/06/2014 0454   GLUCOSE 83 08/26/2012 1300   BUN <5* 09/06/2014 0454   BUN 29* 08/26/2012 1300   BUN 4 08/19/2009   CREATININE 0.52 09/06/2014 0454   CREATININE 0.61 07/16/2014 0948   CREATININE 0.79 08/26/2012 1300   CALCIUM 8.9 09/06/2014 0454   CALCIUM 9.9 08/26/2012 1300   GFRNONAA >60 09/06/2014 0454   GFRNONAA >89 07/16/2014 0948   GFRNONAA >60 08/26/2012 1300   GFRAA >60 09/06/2014 0454   GFRAA >89 07/16/2014 0948   GFRAA >60 08/26/2012 1300    COAG Lab Results  Component Value Date   INR 1.04 08/26/2014   INR 1.04  08/25/2014   INR 1.85* 02/21/2013   No results found for: PTT  Disposition: 01-Home or Self Care  Discharge Instructions    Call MD for:  redness, tenderness, or signs of infection (pain, swelling, bleeding, redness, odor or green/yellow discharge around incision site)    Complete by:  As directed      Call MD for:  severe or increased pain, loss or decreased feeling  in affected limb(s)    Complete by:  As directed      Call MD for:  temperature >100.5    Complete by:  As directed      Driving Restrictions    Complete by:  As directed   No driving for 2 weeks     Lifting restrictions    Complete by:  As directed   No lifting for 4 weeks     Resume previous diet    Complete by:  As directed      may wash over wound with mild soap and water    Complete by:  As directed   Wash the groin wound with soap and water daily and pat dry. (No tub bath-only shower)  Then put a dry gauze or washcloth there to keep this area dry daily and as needed.  Do not use Vaseline or neosporin on your incisions.  Only use soap and water on your incisions and then protect and keep dry.            Medication List    STOP taking these medications        fluconazole 150 MG tablet  Commonly known as:  DIFLUCAN     metroNIDAZOLE 500 MG tablet  Commonly known as:  FLAGYL     traMADol 50 MG tablet  Commonly known as:  ULTRAM      TAKE these medications        acetaminophen 500 MG tablet  Commonly known as:  TYLENOL  Take 1,000 mg by mouth daily as needed (pain).     albuterol 108 (90 BASE) MCG/ACT inhaler  Commonly known as:  PROVENTIL HFA;VENTOLIN HFA  Inhale 2 puffs into the lungs every 6 (six) hours as needed for wheezing.     CALCIUM PO  Take 1 tablet by mouth daily.     Carboxymeth-Glycerin-Polysorb 0.5-1-0.5 % Soln  Place 2 drops into both eyes 3 (three) times daily.     clidinium-chlordiazePOXIDE 5-2.5 MG per capsule  Commonly known as:  LIBRAX  TAKE ONE CAPSULE BY MOUTH 4 TIMES A  DAY BEFORE MEALS AND AT BEDTIME, AS NEEDED FOR ABDOMINAL PAIN  dicyclomine 20 MG tablet  Commonly known as:  BENTYL  Take 1 tablet (20 mg total) by mouth 4 (four) times daily -  before meals and at bedtime.     diphenhydrAMINE 12.5 MG/5ML liquid  Commonly known as:  BENADRYL  Take 25 mg by mouth 4 (four) times daily as needed for itching or allergies.     EPINEPHrine 0.3 mg/0.3 mL Soaj injection  Commonly known as:  EPIPEN  Inject 0.3 mLs (0.3 mg total) into the muscle daily as needed (anaphylaxis).     feeding supplement (RESOURCE BREEZE) Liqd  Take 1 Container by mouth 3 (three) times daily between meals.     fentaNYL 25 MCG/HR patch  Commonly known as:  DURAGESIC - dosed mcg/hr  Place 1 patch (25 mcg total) onto the skin every 3 (three) days.     fluocinonide cream 0.05 %  Commonly known as:  LIDEX  Apply 1 application topically 2 (two) times daily as needed (rash).     GAS-X EXTRA STRENGTH PO  Take 1 tablet by mouth 3 (three) times daily after meals.     ketoconazole 2 % shampoo  Commonly known as:  NIZORAL  Apply 1 application topically 2 (two) times a week.     ketotifen 0.025 % ophthalmic solution  Commonly known as:  ZADITOR  Place 1 drop into both eyes 2 (two) times daily.     multivitamin with minerals Tabs tablet  Take 1 tablet by mouth daily.     omeprazole 40 MG capsule  Commonly known as:  PRILOSEC  Take 1 capsule (40 mg total) by mouth daily.     ondansetron 4 MG tablet  Commonly known as:  ZOFRAN  TAKE 1 TABLET (4 MG TOTAL) BY MOUTH EVERY 8 (EIGHT) HOURS AS NEEDED FOR NAUSEA OR VOMITING.     oxyCODONE-acetaminophen 5-325 MG per tablet  Commonly known as:  ROXICET  Take 1 tablet by mouth every 8 (eight) hours as needed for severe pain.     polyethylene glycol powder powder  Commonly known as:  GLYCOLAX/MIRALAX  Take 17 g by mouth daily.     potassium chloride SA 20 MEQ tablet  Commonly known as:  K-DUR,KLOR-CON  Take 1 tablet BID for 2 days,  Take 1 tablet once a day for 4 days     predniSONE 10 MG tablet  Commonly known as:  DELTASONE  Take 3 tabs daily 3 days, then 2 tabs daily 3 days, then 1 tab daily 3 days, then stop.     PROBIOTIC DAILY PO  Take 1 tablet by mouth daily.     ranitidine 150 MG tablet  Commonly known as:  ZANTAC  Take 1 tablet (150 mg total) by mouth 2 (two) times daily.     saccharomyces boulardii 250 MG capsule  Commonly known as:  FLORASTOR  Take 1 capsule (250 mg total) by mouth 2 (two) times daily.     sucralfate 1 G tablet  Commonly known as:  CARAFATE  Take 1 tablet (1 g total) by mouth 4 (four) times daily -  with meals and at bedtime.     Vitamin D (Ergocalciferol) 50000 UNITS Caps capsule  Commonly known as:  DRISDOL  Take 1 capsule (50,000 Units total) by mouth every 7 (seven) days.           Follow-up Information    Follow up with [facility].   Why:  resume HH-PT   Contact information:   [place]  [place] [address] [phone]       Follow up with [facility].   Why:  3n1- to be delivered to room prior to discharge   Contact information:   [address] [place]  [address] [phone]       Signed: Adele Barthel 09/09/2014, 1:11 PM

## 2014-09-09 NOTE — Progress Notes (Signed)
Discharge education completed by RN. Pt received a copy of discharge paperwork and confirms understanding of follow up appointments and discharge medications. Pt denies any questions at this time. IV removed, site is within normal limits. RN and pt verified that pt belongings retrieved from the hospital safe were present and accounted for. Pt educated that she would need to wait for DME before leaving the unit, pt upset, stated that she had been waiting to long to leave and was not waiting any longer for equipment to come.  Pt will discharge from the unit via wheelchair.

## 2014-09-10 ENCOUNTER — Telehealth: Payer: Self-pay

## 2014-09-10 NOTE — Telephone Encounter (Signed)
Phone call from pt.  Questioned why her Percocet was only ordered q 8hrs prn, instead of q 4 hrs prn as it was in the hospital.  Rated her incisional pain at 8/10.  Stated she was walking with her walker when she 1st got home yesterday, but today reported she has more pain in her legs and having trouble walking.  Reported the pain starts in her back and goes down into her legs.  Denies rest pain.  Reported she has "a little numbness in her feet", when questioned.  Discussed with Dr. Bridgett Larsson.  Recommended that pt. may take the Percocet 1-2 tabs q 4-6 hrs./ prn.  Called pt. and advised of the approval from Dr. Bridgett Larsson to take the Percocet q 4-6 hrs./ prn.  Verb. Understanding.   Pt. Asking about approval for a walker with 4 wheels instead of 2 wheels.  Stated it is hard to use the walker with 2 wheels.  Advised will get recommendation from Dr. Bridgett Larsson and notify her on Mon., 5/23.  Agreed.

## 2014-09-10 NOTE — Telephone Encounter (Signed)
rec'd phone call from [place], [place] @ [facility].  Stated the pt. was to receive a 3 in 1 BS commode, prior to her discharge, but didn't receive it.  Pt. needs a 3-in-1 BC commode, to assist pt. in her recovery process from recent (R) Iliac Art., and Femoral Art. Endarterectomy, and (R)-(L) Fem-Fem BP surgery. (Dx. PAD; ICD 10 # I70.92)  Requesting an order from Dr. Bridgett Larsson for this.  Gave a verbal order per phone to provide this to pt.  Will fax note to [facility] to support this order.

## 2014-09-15 ENCOUNTER — Telehealth: Payer: Self-pay

## 2014-09-15 ENCOUNTER — Other Ambulatory Visit: Payer: Self-pay

## 2014-09-15 ENCOUNTER — Telehealth: Payer: Self-pay | Admitting: Radiology

## 2014-09-15 MED ORDER — CILIDINIUM-CHLORDIAZEPOXIDE 2.5-5 MG PO CAPS
ORAL_CAPSULE | ORAL | Status: DC
Start: 2014-09-15 — End: 2014-11-17

## 2014-09-15 NOTE — Telephone Encounter (Signed)
Will fax rx. Pt notified.

## 2014-09-15 NOTE — Telephone Encounter (Signed)
Phone call from pt.  Reported swelling in bilateral lower extremities from feet to knees; stated right is greater than left.  Denied any increased pain, warmth or redness of lower extremities.  Reported incisions are intact and denied drainage.  Denied fever/ chills.  Stated she is elevating her legs while sitting in her recliner, but not sure if she is raising her legs high enough.  Advised to elevate legs on pillows in her recliner minimum of 4 x/ day.  Encouraged to position her legs higher than her chest.  Also, encouraged to continue walking, and increase her distance, gradually, to maintain and increase her strength.  Encouraged to call office if any worsening of symptoms or concerns.  Verb. Understanding.

## 2014-09-17 ENCOUNTER — Encounter: Payer: Self-pay | Admitting: Vascular Surgery

## 2014-09-22 ENCOUNTER — Ambulatory Visit (INDEPENDENT_AMBULATORY_CARE_PROVIDER_SITE_OTHER): Payer: Self-pay | Admitting: Vascular Surgery

## 2014-09-22 ENCOUNTER — Encounter: Payer: Self-pay | Admitting: Vascular Surgery

## 2014-09-22 VITALS — BP 101/60 | HR 60 | Resp 14 | Ht 65.0 in | Wt 86.0 lb

## 2014-09-22 DIAGNOSIS — Z48812 Encounter for surgical aftercare following surgery on the circulatory system: Secondary | ICD-10-CM

## 2014-09-22 DIAGNOSIS — I70219 Atherosclerosis of native arteries of extremities with intermittent claudication, unspecified extremity: Secondary | ICD-10-CM

## 2014-09-22 MED ORDER — OXYCODONE-ACETAMINOPHEN 5-325 MG PO TABS: 1.0000 | ORAL_TABLET | ORAL | Status: DC | PRN

## 2014-09-22 NOTE — Progress Notes (Signed)
    Postoperative Visit   History of Present Illness  Cynthia Bowen is a 69 y.o. year old female who presents for postoperative follow-up for:  1.  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  R iliofem EA w/ DPA, R to L fem-fem BPG (Dr. Donnetta Hutching) on 09/05/14 for L CIA stent occlusion  The patient's wounds are healing.  The patient notes lower extremity symptoms are improved.  The patient is able to complete their activities of daily living.  The patient's current symptoms are: pain in fem-fem crossover.  For VQI Use Only  PRE-ADM LIVING: Home  AMB STATUS: Ambulatory  Physical Examination  Filed Vitals:   09/22/14 1407  BP: 101/60  Pulse: 60  Resp: 14   BLE: B groin inc c/d/i, healing, fem-fem graft palpable, superficial in depth due to pt's lack of fat, no palpable pedal pulses  Medical Decision Making  Cynthia Bowen is a 69 y.o. year old female who presents s/p R iliofem EA w/ DPA, R to L fem-fem BPG, Aortic stenting for near occlusion, L CIA stent occlusion .  The patient's bypass incisions are healing appropriately with resolution of pre-operative symptoms.  I gave the patient Percocet 5/325 mg 1 po q4 hr prn pain #30 only no refill.  This patient was chronically on narcotics from her gastric ulcer procedures so her pain threshold is higher.  We have discussed weaning herself off the narcotics.  If she is unable, she may need to be referred to Pain Clinic. I discussed in depth with the patient the nature of atherosclerosis, and emphasized the importance of maximal medical management including strict control of blood pressure, blood glucose, and lipid levels, obtaining regular exercise, and cessation of smoking.  The patient is aware that without maximal medical management the underlying atherosclerotic disease process will progress, limiting the benefit of any interventions. The patient's surveillance will included  ABI and Aortoiliac duplex studies which will be completed in: 3 months, at which time the patient will be re-evaluated.   I emphasized the importance of routine surveillance of the patient's bypass, as the vascular surgery literature emphasize the improved patency possible with assisted primary patency procedures versus secondary patency procedures. The patient agrees to participate in their maximal medical care and routine surveillance.  Thank you for allowing [place] to participate in this patient's care.  Adele Barthel, MD [facility] Office: [phone] Pager: [phone]  09/22/2014, 2:47 PM

## 2014-09-22 NOTE — Addendum Note (Signed)
Addended by: Dorthula Rue L on: 09/22/2014 03:56 PM   Modules accepted: Orders

## 2014-09-24 ENCOUNTER — Telehealth: Payer: Self-pay | Admitting: *Deleted

## 2014-09-24 NOTE — Telephone Encounter (Signed)
Patient called to see if she may drive her car. She is s/p EVAR on 08-26-14 by Dr. Bridgett Larsson.  When I reviewed her pain level, she states that she still is on pain medications for postoperative pain as well as chronic pain. I explained to her that she should not drive a vehicle while on opioid meds. She said she is trying to wean off these drugs and will not drive until she is off them completely. Patient voiced understanding and agreement with this plan.

## 2014-10-01 ENCOUNTER — Ambulatory Visit (INDEPENDENT_AMBULATORY_CARE_PROVIDER_SITE_OTHER): Payer: Medicare Other | Admitting: Internal Medicine

## 2014-10-01 VITALS — BP 92/52 | HR 92 | Temp 98.6°F | Resp 16 | Ht 63.0 in | Wt 86.2 lb

## 2014-10-01 DIAGNOSIS — I739 Peripheral vascular disease, unspecified: Secondary | ICD-10-CM | POA: Diagnosis not present

## 2014-10-01 DIAGNOSIS — D649 Anemia, unspecified: Secondary | ICD-10-CM | POA: Diagnosis not present

## 2014-10-01 DIAGNOSIS — I70219 Atherosclerosis of native arteries of extremities with intermittent claudication, unspecified extremity: Secondary | ICD-10-CM

## 2014-10-01 DIAGNOSIS — I70208 Unspecified atherosclerosis of native arteries of extremities, other extremity: Secondary | ICD-10-CM | POA: Diagnosis not present

## 2014-10-01 DIAGNOSIS — R42 Dizziness and giddiness: Secondary | ICD-10-CM | POA: Diagnosis not present

## 2014-10-01 DIAGNOSIS — I70209 Unspecified atherosclerosis of native arteries of extremities, unspecified extremity: Secondary | ICD-10-CM

## 2014-10-01 LAB — POCT CBC
Granulocyte percent: 61.8 %G (ref 37–80)
HEMATOCRIT: 32.8 % — AB (ref 37.7–47.9)
HEMOGLOBIN: 10.6 g/dL — AB (ref 12.2–16.2)
Lymph, poc: 2 (ref 0.6–3.4)
MCH: 28.4 pg (ref 27–31.2)
MCHC: 32.4 g/dL (ref 31.8–35.4)
MCV: 87.6 fL (ref 80–97)
MID (CBC): 0.4 (ref 0–0.9)
MPV: 6.4 fL (ref 0–99.8)
PLATELET COUNT, POC: 398 10*3/uL (ref 142–424)
POC Granulocyte: 3.9 (ref 2–6.9)
POC LYMPH PERCENT: 31.9 %L (ref 10–50)
POC MID %: 6.3 %M (ref 0–12)
RBC: 3.75 M/uL — AB (ref 4.04–5.48)
RDW, POC: 16.3 %
WBC: 6.3 10*3/uL (ref 4.6–10.2)

## 2014-10-01 NOTE — Progress Notes (Signed)
Subjective:  This chart was scribed for [name] MD, by Tamsen Roers, at [facility].  This patient was seen in room 3 and the patient's care was started at 2:04 PM.    Patient ID: Cynthia Bowen, female    DOB: 1946/03/29, 69 y.o.   MRN: [id number] Chief Complaint  Patient presents with  . Follow-up    Post surgery follow up    HPI HPI Comments: Cynthia Bowen is a 69 y.o. female who presents to the [facility] for a post surgery follow up (femoral bypass) where they put a stent on her right leg and connected it to the left.  She has been out of the hospital since the 19th and now feels like she has more strength in her legs and no longer has trouble with swelling. She is currently doing daily exercises to get back good circulation in her legs.  She has diziness when she first stands up so she is trying to get up at a slower pace.  She is trying to eat more but still feels full quickly.  She is currently not using blood pressure medication.  She is improving.  Note recent past history of systems serious gastrointestinal issues complicated by surgery, problems throughout the post procedure time. Her calorie intake is better by frequent small feedings.  Patient Active Problem List   Diagnosis Date Noted  . Ischemia of lower extremity 09/05/2014  . Visit for wound check 09/03/2014  . Abdominal aortic stenosis 09/03/2014  . PAD (peripheral artery disease) 08/26/2014  . Discoloration of skin- Left Heel 08/13/2014  . Left foot pain- Heel 08/13/2014  . Leg heaviness-Bilateral 08/13/2014  . Protein-calorie malnutrition, severe 06/04/2014  . Clostridium difficile diarrhea 06/03/2014  . Cellulitis of hand 06/02/2014  . Hypokalemia 06/02/2014  . Osteoporosis 05/22/2014  . Recurrent abdominal pain--multiple causes, status post GI surgery with multiple complications 2774 12/87/8676  . Abnormal CT scan 02/08/2014  . Atherosclerosis of  native arteries of extremity with intermittent claudication 01/15/2014  . Nephrolithiasis-asymptomatic/noted on scan 10/08/2013  . Other and unspecified ovarian cyst--noted on scans 2014 2015/asymptomatic 10/08/2013  . Pancreatic lesion-6 mm noted on MRI at [facility] 10/08/2013  . Femoral artery stenosis-bilateral noted CT at [facility] 2015 10/08/2013  . Protein calorie malnutrition 01/15/2013  . PE (pulmonary embolism) 08/05/2012  . Incarcerated epigastric hernia s/p primary repair 07/02/2012 07/11/2012  . 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 [id number] 07/10/2012  . Pericardial effusion 04/17/2012  . COPD GOLD I 01/29/2012  . Back pain DDD S/p surgery [place] 1993 12/29/2011  . Depression with anxiety 04/02/2011  . GERD (gastroesophageal reflux disease) 04/02/2011  . Osteoarthritis 04/02/2011  . Cervical neck pain with evidence of disc disease-s/p surgery [id number] 04/02/2011  . Fibromyalgia-Deveschwar 2004 04/02/2011      Prior to Admission medications   Medication Sig Start Date End Date Taking? Authorizing Provider  acetaminophen (TYLENOL) 500 MG tablet Take 1,000 mg by mouth daily as needed (pain).   Yes Historical Provider, MD  CALCIUM PO Take 1 tablet by mouth daily.   Yes Historical Provider, MD  Carboxymeth-Glycerin-Polysorb 0.5-1-0.5 % SOLN Place 2 drops into both eyes 3 (three) times daily.   Yes Historical Provider, MD  dicyclomine (BENTYL) 20 MG tablet Take 1 tablet (20 mg total) by mouth 4 (four) times daily -  before meals and at bedtime. 08/03/14  Yes [name] Koyanagi, MD  diphenhydrAMINE (BENADRYL) 12.5 MG/5ML liquid  Take 25 mg by mouth 4 (four) times daily as needed for itching or allergies.    Yes Historical Provider, MD  EPINEPHrine (EPIPEN) 0.3 mg/0.3 mL IJ SOAJ injection Inject 0.3 mLs (0.3 mg total) into the muscle daily as needed (anaphylaxis). Patient taking differently: Inject 0.3 mg into the muscle as needed  (allergic reaction).  10/07/13  Yes [name] Koyanagi, MD  feeding supplement, RESOURCE BREEZE, (RESOURCE BREEZE) LIQD Take 1 Container by mouth 3 (three) times daily between meals. 06/05/14  Yes Modena Jansky, MD  fluocinonide cream (LIDEX) 9.62 % Apply 1 application topically 2 (two) times daily as needed (rash). Patient taking differently: Apply 1 application topically 2 (two) times daily as needed (eczema).  06/05/14  Yes Modena Jansky, MD  ketoconazole (NIZORAL) 2 % shampoo Apply 1 application topically 2 (two) times a week. Patient taking differently: Apply 1 application topically See admin instructions. Use twice a week as needed for eczema 10/28/13  Yes [name] Koyanagi, MD  ketotifen (ZADITOR) 0.025 % ophthalmic solution Place 1 drop into both eyes 2 (two) times daily.   Yes Historical Provider, MD  Multiple Vitamin (MULTIVITAMIN WITH MINERALS) TABS tablet Take 1 tablet by mouth daily.   Yes Historical Provider, MD  ondansetron (ZOFRAN) 4 MG tablet TAKE 1 TABLET (4 MG TOTAL) BY MOUTH EVERY 8 (EIGHT) HOURS AS NEEDED FOR NAUSEA OR VOMITING. Patient taking differently: Take 4 mg by mouth every 8 (eight) hours as needed for nausea or vomiting.  07/01/14  Yes [name] Koyanagi, MD  oxyCODONE-acetaminophen (PERCOCET/ROXICET) 5-325 MG per tablet Take 1 tablet by mouth every 4 (four) hours as needed for severe pain. 09/22/14  Yes Conrad Kingston, MD  polyethylene glycol powder (GLYCOLAX/MIRALAX) powder Take 17 g by mouth daily. Patient taking differently: Take 17 g by mouth at bedtime. Mix in 8 oz of water and drink 07/01/14  Yes [name] Koyanagi, MD  potassium chloride SA (K-DUR,KLOR-CON) 20 MEQ tablet Take 1 tablet BID for 2 days, Take 1 tablet once a day for 4 days 05/26/14  Yes Chelle Jeffery, PA-C  Probiotic Product (PROBIOTIC DAILY PO) Take 1 tablet by mouth daily.    Yes Historical Provider, MD  ranitidine (ZANTAC) 150 MG tablet Take 1 tablet (150 mg total) by mouth 2 (two) times daily. 07/01/14   Yes [name] Koyanagi, MD  Simethicone (GAS-X EXTRA STRENGTH PO) Take 1 tablet by mouth 3 (three) times daily after meals.    Yes Historical Provider, MD  sucralfate (CARAFATE) 1 G tablet Take 1 tablet (1 g total) by mouth 4 (four) times daily -  with meals and at bedtime. 06/25/14  Yes [name] Koyanagi, MD  Vitamin D, Ergocalciferol, (DRISDOL) 50000 UNITS CAPS capsule Take 1 capsule (50,000 Units total) by mouth every 7 (seven) days. Patient taking differently: Take 50,000 Units by mouth every 7 (seven) days. On Saturday 06/29/14  Yes Robyn Haber, MD  albuterol (PROVENTIL HFA;VENTOLIN HFA) 108 (90 BASE) MCG/ACT inhaler Inhale 2 puffs into the lungs every 6 (six) hours as needed for wheezing. Patient not taking: Reported on 10/01/2014 11/17/13   [name] Koyanagi, MD  clidinium-chlordiazePOXIDE (LIBRAX) 5-2.5 MG per capsule TAKE ONE CAPSULE BY MOUTH 4 TIMES A DAY BEFORE MEALS AND AT BEDTIME, AS NEEDED FOR ABDOMINAL PAIN Patient not taking: Reported on 09/22/2014 09/15/14   Robyn Haber, MD  fentaNYL (DURAGESIC - DOSED MCG/HR) 25 MCG/HR patch Place 1 patch (25 mcg total) onto the skin every 3 (three) days. Patient not  taking: Reported on 10/01/2014 09/09/14   Hulen Shouts Rhyne, PA-C  fluconazole (DIFLUCAN) 150 MG tablet  06/16/14   Historical Provider, MD  omeprazole (PRILOSEC) 40 MG capsule Take 1 capsule (40 mg total) by mouth daily. Patient not taking: Reported on 09/22/2014 05/26/14   Posey Boyer, MD  predniSONE (DELTASONE) 10 MG tablet Take 3 tabs daily 3 days, then 2 tabs daily 3 days, then 1 tab daily 3 days, then stop. Patient not taking: Reported on 09/04/2014 06/05/14   Modena Jansky, MD  predniSONE (DELTASONE) 20 MG tablet  06/25/14   Historical Provider, MD  saccharomyces boulardii (FLORASTOR) 250 MG capsule Take 1 capsule (250 mg total) by mouth 2 (two) times daily. Patient not taking: Reported on 09/04/2014 06/05/14   Modena Jansky, MD      Review of Systems  Constitutional:  Negative for fever, chills, appetite change and unexpected weight change.  Eyes: Negative for visual disturbance.  Respiratory: Negative for chest tightness and shortness of breath.   Cardiovascular: Negative for chest pain, palpitations and leg swelling.  Gastrointestinal: Negative for nausea, vomiting and abdominal pain.  Musculoskeletal: Positive for gait problem. Negative for neck pain and neck stiffness.  Neurological: Positive for dizziness. Negative for tremors, syncope and headaches.  Psychiatric/Behavioral: Negative for dysphoric mood.       Objective:   Physical Exam  Constitutional: She is oriented to person, place, and time. She appears well-developed and well-nourished. No distress.  HENT:  Head: Normocephalic and atraumatic.  Eyes: Conjunctivae and EOM are normal. Pupils are equal, round, and reactive to light.  Neck: Neck supple. No thyromegaly present.  Cardiovascular: Normal rate, regular rhythm, normal heart sounds and intact distal pulses.   No murmur heard. Pulmonary/Chest: Effort normal and breath sounds normal. No respiratory distress.  Lymphadenopathy:    She has no cervical adenopathy.  Neurological: She is alert and oriented to person, place, and time. She has normal reflexes. No cranial nerve deficit. She exhibits normal muscle tone. Coordination normal.  Skin: Skin is warm and dry.  Psychiatric: She has a normal mood and affect. Her behavior is normal.  Nursing note and vitals reviewed.  Filed Vitals:   10/01/14 1351  BP: 92/52  Pulse: 92  Temp: 98.6 F (37 C)  TempSrc: Oral  Resp: 16  Height: 5\' 3"  (1.6 m)  Weight: 86 lb 3.2 oz (39.1 kg)  SpO2: 97%    Results for orders placed or performed in visit on 10/01/14  POCT CBC  Result Value Ref Range   WBC 6.3 4.6 - 10.2 K/uL   Lymph, poc 2.0 0.6 - 3.4   POC LYMPH PERCENT 31.9 10 - 50 %L   MID (cbc) 0.4 0 - 0.9   POC MID % 6.3 0 - 12 %M   POC Granulocyte 3.9 2 - 6.9   Granulocyte percent 61.8 37  - 80 %G   RBC 3.75 (A) 4.04 - 5.48 M/uL   Hemoglobin 10.6 (A) 12.2 - 16.2 g/dL   HCT, POC 32.8 (A) 37.7 - 47.9 %   MCV 87.6 80 - 97 fL   MCH, POC 28.4 27 - 31.2 pg   MCHC 32.4 31.8 - 35.4 g/dL   RDW, POC 16.3 %   Platelet Count, POC 398 142 - 424 K/uL   MPV 6.4 0 - 99.8 fL   anemia is improving       Assessment & Plan:  I have participated in the care of this patient with the Advanced  Practice Provider and agree with Diagnosis and Plan as documented. Bernyce Brimley P. Laney Pastor, M.D.  Femoral artery stenosis-bilateral noted CT at [facility] 2015  Atherosclerosis of native arteries of extremity with intermittent claudication  PAD (peripheral artery disease)  Dizziness- Comprehensive metabolic panel  Anemia, unspecified anemia type - improved   at this point she should continue her physical therapy post procedure and continue to increase her calorie intake without any change in medications   Recheck 2 months

## 2014-10-02 LAB — COMPREHENSIVE METABOLIC PANEL
ALBUMIN: 3.7 g/dL (ref 3.5–5.2)
ALT: 13 U/L (ref 0–35)
AST: 17 U/L (ref 0–37)
Alkaline Phosphatase: 79 U/L (ref 39–117)
BUN: 11 mg/dL (ref 6–23)
CO2: 20 mEq/L (ref 19–32)
Calcium: 9.4 mg/dL (ref 8.4–10.5)
Chloride: 109 mEq/L (ref 96–112)
Creat: 0.65 mg/dL (ref 0.50–1.10)
Glucose, Bld: 84 mg/dL (ref 70–99)
POTASSIUM: 4.1 meq/L (ref 3.5–5.3)
SODIUM: 141 meq/L (ref 135–145)
Total Bilirubin: 0.2 mg/dL (ref 0.2–1.2)
Total Protein: 7.1 g/dL (ref 6.0–8.3)

## 2014-10-03 ENCOUNTER — Encounter: Payer: Self-pay | Admitting: Internal Medicine

## 2014-10-04 ENCOUNTER — Telehealth: Payer: Self-pay | Admitting: *Deleted

## 2014-10-04 NOTE — Telephone Encounter (Signed)
I called patient to follow up on a Patient Questionnaire response where the patient states that she is experiencing some dizziness. Also she would like to know when she will be able to drive.  I called both phone #'s, her cell and home # and did not get an answer. I left a message on each phone to call me so I could discuss these questions with her.

## 2014-10-05 ENCOUNTER — Telehealth: Payer: Self-pay | Admitting: *Deleted

## 2014-10-05 NOTE — Telephone Encounter (Signed)
Called patient and discussed dizziness.  She states that she had left a message to disregard that message. She had seen her PCP and he had prescribed a new medication for her.  She is to call if any questions concerning her previous surgery.

## 2014-10-13 ENCOUNTER — Telehealth: Payer: Self-pay

## 2014-10-13 ENCOUNTER — Encounter: Payer: Self-pay | Admitting: Family

## 2014-10-13 NOTE — Telephone Encounter (Signed)
Phone call from pt.  Requested refill on her pain medication.  C/o pain that starts in right groin and shoots across to the middle area, between the right and left groin.  Stated there is "a little puffiness there."  Denied redness or warmth, but said "the skin looks a little darker."  Denied any drainage from right or left groin incisions.  Stated the incisions are intact.  Denied fever or chills.  Reported that the pain was at a "3-4", and yesterday, started to increase to a "6-7".   Advised will need to schedule an appt. for evaluation, before refill on pain medication can be given.  Appt given for 10:30 AM 10/15/14.  Agrees with plan.

## 2014-10-15 ENCOUNTER — Ambulatory Visit (INDEPENDENT_AMBULATORY_CARE_PROVIDER_SITE_OTHER): Payer: Self-pay | Admitting: Family

## 2014-10-15 ENCOUNTER — Encounter: Payer: Self-pay | Admitting: Family

## 2014-10-15 VITALS — BP 117/71 | HR 90 | Ht 63.0 in | Wt 83.7 lb

## 2014-10-15 DIAGNOSIS — Z9889 Other specified postprocedural states: Secondary | ICD-10-CM

## 2014-10-15 DIAGNOSIS — G894 Chronic pain syndrome: Secondary | ICD-10-CM

## 2014-10-15 DIAGNOSIS — I739 Peripheral vascular disease, unspecified: Secondary | ICD-10-CM

## 2014-10-15 DIAGNOSIS — Z87891 Personal history of nicotine dependence: Secondary | ICD-10-CM

## 2014-10-15 DIAGNOSIS — Z95828 Presence of other vascular implants and grafts: Secondary | ICD-10-CM

## 2014-10-15 MED ORDER — PREGABALIN 50 MG PO CAPS: 50.0000 mg | ORAL_CAPSULE | Freq: Three times a day (TID) | ORAL | Status: DC

## 2014-10-15 NOTE — Progress Notes (Signed)
    Postoperative Visit   History of Present Illness  Cynthia Bowen is a 69 y.o. year old female patient of Dr. Bridgett Larsson who is s/p Aortic stenting, L CIA stenting for L CIA jailing caused by downward tick of aortic stent (08/26/14,  and R iliofem EA w/ DPA, R to L fem-fem BPG (Dr. Donnetta Hutching) on 09/05/14 for L CIA stent occlusion.  She returns today with c/o increased pain in right groin, requesting refill on narcotics. She denies injury, is vague on the history of right groin pain recurrence, denies injury. She denies fever or chills.  She last saw Dr. Bridgett Larsson on 09/22/14. At that time the patient's bypass incisions were healing appropriately with resolution of pre-operative symptoms. Dr. Bridgett Larsson gave the patient Percocet 5/325 mg 1 po q4 hr prn pain #30 only no refill. This patient was chronically on narcotics from her gastric ulcer procedures so her pain threshold is higher. Dr. Bridgett Larsson and pt discussed weaning herself off the narcotics. If she is unable, she may need to be referred to Pain Clinic.  She was seen in a pain management clinic in [place] about a week ago, states she was given neurontin which she did not pick up at her pharmacy as she states neurontin causes nausea and she is allergic to it.  The patient's wounds are healed.  The patient notes resolution of lower extremity symptoms, left heel ulcer is healing well since the last vascular surgery.  The patient is able to complete their activities of daily living.    For VQI Use Only  PRE-ADM LIVING: Home  AMB STATUS: Ambulatory  Physical Examination  Filed Vitals:   10/15/14 1046  BP: 117/71  Pulse: 90  Height: 5\' 3"  (1.6 m)  Weight: 83 lb 11.2 oz (37.966 kg)  SpO2: 99%   Body mass index is 14.83 kg/(m^2).   BLE: B groin inc c/d/i, healing, fem-fem graft palpable, superficial in depth due to pt's lack of fat, no palpable pedal pulses. Pt indicates tenderness to palpation at right groin, no swelling.   Medical Decision  Making  Cynthia Bowen is a 69 y.o. year old female who presents s/p Aortic stenting, L CIA stenting for L CIA jailing caused by downward tick of aortic stent (08/26/14,  and R iliofem EA w/ DPA, R to L fem-fem BPG (Dr. Donnetta Hutching) on 09/05/14 for L CIA stent occlusion.  Dr. Bridgett Larsson spoke with and examined pt. Lyrica 50 mg, 1 tab po tid, disp #90, 1 refill. No additional refills. Further chronic pain management by pain management clinic that pt was in a week ago. Follow up with Dr. Bridgett Larsson as already scheduled on 12/24/14.   The patient's bypass incisions are healing appropriately with resolution of pre-operative symptoms. The patient agrees to participate in their maximal medical care and routine surveillance.  Thank you for allowing [place] to participate in this patient's care.  NICKEL, Sharmon Leyden, RN, MSN, FNP-C [facility] Office: [phone]  10/15/2014, 10:54 AM  Clinic MD: Bridgett Larsson

## 2014-10-26 ENCOUNTER — Telehealth: Payer: Self-pay

## 2014-10-26 NOTE — Telephone Encounter (Signed)
Phone call from pt.  Questioned if she is okay to swim and to drive.  Reported her incisions were completely healed, and she is no longer taking narcotic pain medications.  Advised since she is 6 wks. Post-op, and incisions have healed, then she is okay to swim.  Advised if not on pain medication, she could drive @ this time, and emphasized that if she uses any narcotic pain medication, she should not drive.  Verb. Understanding of recommendations.

## 2014-11-02 ENCOUNTER — Encounter (HOSPITAL_COMMUNITY): Payer: Medicare Other

## 2014-11-02 ENCOUNTER — Ambulatory Visit: Payer: Medicare Other | Admitting: Family

## 2014-11-06 ENCOUNTER — Other Ambulatory Visit: Payer: Self-pay | Admitting: Internal Medicine

## 2014-11-10 ENCOUNTER — Telehealth: Payer: Self-pay

## 2014-11-10 NOTE — Telephone Encounter (Signed)
Phone call from pt.  Reported area on right groin "raised-up like a goose egg" 3-4 days ago.  Stated "I can press - in on it."  Denied drainage, pain, fever/ chills.  Stated left groin is "flat and smooth."   Also c/o legs feeling heavy since yesterday.  Discussed with Dr. Bridgett Larsson.  Recommended to schedule appt. at 1:00 PM 11/12/14; stated no vascular lab needed with initial eval.  Pt. notified of appt. 7/22.  Agrees with plan.

## 2014-11-11 ENCOUNTER — Encounter: Payer: Self-pay | Admitting: Vascular Surgery

## 2014-11-12 ENCOUNTER — Other Ambulatory Visit: Payer: Self-pay | Admitting: Vascular Surgery

## 2014-11-12 ENCOUNTER — Ambulatory Visit (HOSPITAL_COMMUNITY)
Admission: RE | Admit: 2014-11-12 | Discharge: 2014-11-12 | Disposition: A | Discharge status: Home / Self Care | Payer: Medicare Other | Source: Ambulatory Visit | Attending: Vascular Surgery | Admitting: Vascular Surgery

## 2014-11-12 ENCOUNTER — Encounter: Payer: Self-pay | Admitting: Vascular Surgery

## 2014-11-12 ENCOUNTER — Other Ambulatory Visit: Payer: Self-pay | Admitting: Internal Medicine

## 2014-11-12 ENCOUNTER — Ambulatory Visit (INDEPENDENT_AMBULATORY_CARE_PROVIDER_SITE_OTHER): Payer: Self-pay | Admitting: Vascular Surgery

## 2014-11-12 VITALS — BP 97/67 | HR 96 | Temp 97.8°F | Ht 63.0 in | Wt 84.9 lb

## 2014-11-12 DIAGNOSIS — I70219 Atherosclerosis of native arteries of extremities with intermittent claudication, unspecified extremity: Secondary | ICD-10-CM

## 2014-11-12 DIAGNOSIS — R1909 Other intra-abdominal and pelvic swelling, mass and lump: Secondary | ICD-10-CM

## 2014-11-12 MED ORDER — OXYCODONE-ACETAMINOPHEN 5-325 MG PO TABS: 1.0000 | ORAL_TABLET | Freq: Four times a day (QID) | ORAL | Status: DC | PRN

## 2014-11-12 NOTE — Progress Notes (Signed)
    Postoperative Visit   History of Present Illness  Cynthia Bowen is a 69 y.o. (Sep 12, 1945) female  who presents for postoperative follow-up for:  1. 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 R iliofem EA w/ DPA, R to L fem-fem BPG (Dr. Donnetta Hutching) on 09/05/14 for L CIA stent occlusion  Pt notes only 3 days ago of mass in right groin.  Patient is complaining of pain from the right mass.  There has been no drainage so far.  No fever or chills.    For VQI Use Only  PRE-ADM LIVING: Home  AMB STATUS: Ambulatory  Physical Examination  Filed Vitals:   11/12/14 1312  BP: 97/67  Pulse: 96  Temp: 97.8 F (36.6 C)  TempSrc: Oral  Height: 5\' 3"  (1.6 m)  Weight: 84 lb 14.4 oz (38.51 kg)  SpO2: 98%    BLE: B groin healed, fem-fem graft palpable, no palpable pedal pulses, small ballotable mass in right groin in incision line  R groin duplex (11/12/2014) - No obvious anastomotic PSA - No bleeding or leak noted - 2.2 cm x 1.6 cm mass with thick fluid/soft thrombus - possible hematoma   Medical Decision Making  Cynthia Bowen is a 69 y.o. (17-Sep-1945) female  who presents s/p R iliofem EA w/ DPA, R to L fem-fem BPG, Aortic stenting for near occlusion, L CIA stent occlusion, possible R incisional hematoma .  I recommended observation for now, as I think exploration of the mass may result in infection given the proximity to the fem-femoral graft.  Follow up in 2 weeks.  The patient was given #20 Percocet 5/325 mg 1 po q6 hr prn pain to bridge her to her Pain Clinic appointment next week.  Thank you for allowing [place] to participate in this patient's care.  Adele Barthel, MD [facility] [place] Office: [phone] Pager: [phone]  11/12/2014, 3:42 PM

## 2014-11-15 ENCOUNTER — Telehealth: Payer: Self-pay

## 2014-11-15 ENCOUNTER — Ambulatory Visit (INDEPENDENT_AMBULATORY_CARE_PROVIDER_SITE_OTHER): Payer: Self-pay | Admitting: Surgery

## 2014-11-15 ENCOUNTER — Encounter: Payer: Self-pay | Admitting: Surgery

## 2014-11-15 ENCOUNTER — Other Ambulatory Visit: Payer: Self-pay | Admitting: Surgery

## 2014-11-15 VITALS — BP 112/54 | HR 96 | Temp 98.9°F | Resp 18 | Ht 65.0 in | Wt 84.1 lb

## 2014-11-15 DIAGNOSIS — L24A9 Irritant contact dermatitis due friction or contact with other specified body fluids: Secondary | ICD-10-CM

## 2014-11-15 DIAGNOSIS — T148 Other injury of unspecified body region: Secondary | ICD-10-CM

## 2014-11-15 DIAGNOSIS — I70208 Unspecified atherosclerosis of native arteries of extremities, other extremity: Secondary | ICD-10-CM

## 2014-11-15 DIAGNOSIS — T148XXA Other injury of unspecified body region, initial encounter: Secondary | ICD-10-CM

## 2014-11-15 MED ORDER — CEPHALEXIN 500 MG PO CAPS: 500.0000 mg | ORAL_CAPSULE | Freq: Two times a day (BID) | ORAL | Status: DC

## 2014-11-15 MED ORDER — FLUCONAZOLE 150 MG PO TABS: 150.0000 mg | ORAL_TABLET | Freq: Once | ORAL | Status: DC

## 2014-11-15 NOTE — Progress Notes (Signed)
POST OPERATIVE OFFICE NOTE    CC:  F/u for surgery  HPI:  This is a 69 y.o. female who is s/p EVAR for severe aorta occlusive disease.   She states that late last week, she developed a painful mass in her right groin.  She did see Dr. Bridgett Larsson on Friday and the duplex revealed no pseudoaneurysm and a 2x2 x 1.6cm mass with thick fluid/soft thrombus.  Dr. Bridgett Larsson elected observation given close proximity to the graft and increased risk of infection.  She spoke to Dr. Trula Slade over the weekend that her right groin was now draining.  She was told to place gauze to the right groin.   She called the office today stating that her right groin was continuing to drain. She is brought in for evaluation of the groin.  She denies any fevers.    Allergies  Allergen Reactions  . Avelox [Moxifloxacin Hcl In Nacl] Nausea And Vomiting  . Betadine [Povidone Iodine] Itching and Rash  . Alendronate Sodium Nausea And Vomiting and Other (See Comments)    dizziness  . Aspirin Nausea Only  . Codeine Nausea And Vomiting  . Doxycycline Nausea And Vomiting  . Fluconazole Nausea And Vomiting  . Hydrocodone Nausea And Vomiting    GI distress  . Hydrocodone-Acetaminophen Nausea And Vomiting  . Morphine And Related Nausea Only  . Neurontin [Gabapentin] Other (See Comments)    Mood changes   . Nsaids Other (See Comments)    Severe gastritis & perforation - avoid NSAIDs when possible  . Quinolones Hives and Itching  . Sertraline Hcl Nausea And Vomiting and Other (See Comments)    Hallucinations   . Sulfamethoxazole Hives and Itching  . Latex Rash  . Sulfa Antibiotics Rash    Current Outpatient Prescriptions  Medication Sig Dispense Refill  . acetaminophen (TYLENOL) 500 MG tablet Take 1,000 mg by mouth daily as needed (pain).    Marland Kitchen albuterol (PROVENTIL HFA;VENTOLIN HFA) 108 (90 BASE) MCG/ACT inhaler Inhale 2 puffs into the lungs every 6 (six) hours as needed for wheezing. 1 Inhaler 2  . CALCIUM PO Take 1 tablet by  mouth daily.    . Carboxymeth-Glycerin-Polysorb 0.5-1-0.5 % SOLN Place 2 drops into both eyes 3 (three) times daily.    . clidinium-chlordiazePOXIDE (LIBRAX) 5-2.5 MG per capsule TAKE ONE CAPSULE BY MOUTH 4 TIMES A DAY BEFORE MEALS AND AT BEDTIME, AS NEEDED FOR ABDOMINAL PAIN 120 capsule 3  . dicyclomine (BENTYL) 20 MG tablet TAKE 1 TABLET (20 MG TOTAL) BY MOUTH 4 (FOUR) TIMES DAILY - BEFORE MEALS AND AT BEDTIME. 60 tablet 3  . diphenhydrAMINE (BENADRYL) 12.5 MG/5ML liquid Take 25 mg by mouth 4 (four) times daily as needed for itching or allergies.     Marland Kitchen EPINEPHrine (EPIPEN) 0.3 mg/0.3 mL IJ SOAJ injection Inject 0.3 mLs (0.3 mg total) into the muscle daily as needed (anaphylaxis). (Patient taking differently: Inject 0.3 mg into the muscle as needed (allergic reaction). ) 1 Device 3  . feeding supplement, RESOURCE BREEZE, (RESOURCE BREEZE) LIQD Take 1 Container by mouth 3 (three) times daily between meals.    . fentaNYL (DURAGESIC - DOSED MCG/HR) 25 MCG/HR patch Place 1 patch (25 mcg total) onto the skin every 3 (three) days. 5 patch 0  . fluconazole (DIFLUCAN) 150 MG tablet     . fluocinonide cream (LIDEX) 5.78 % Apply 1 application topically 2 (two) times daily as needed (rash). (Patient taking differently: Apply 1 application topically 2 (two) times daily as needed (eczema). )    .  gabapentin (NEURONTIN) 300 MG capsule     . ketoconazole (NIZORAL) 2 % shampoo Apply 1 application topically 2 (two) times a week. (Patient taking differently: Apply 1 application topically See admin instructions. Use twice a week as needed for eczema) 120 mL 0  . ketotifen (ZADITOR) 0.025 % ophthalmic solution Place 1 drop into both eyes 2 (two) times daily.    . Multiple Vitamin (MULTIVITAMIN WITH MINERALS) TABS tablet Take 1 tablet by mouth daily.    Marland Kitchen omeprazole (PRILOSEC) 40 MG capsule Take 1 capsule (40 mg total) by mouth daily. 15 capsule 0  . ondansetron (ZOFRAN) 4 MG tablet TAKE 1 TABLET (4 MG TOTAL) BY MOUTH  EVERY 8 (EIGHT) HOURS AS NEEDED FOR NAUSEA OR VOMITING. (Patient taking differently: Take 4 mg by mouth every 8 (eight) hours as needed for nausea or vomiting. ) 60 tablet 1  . oxyCODONE-acetaminophen (PERCOCET/ROXICET) 5-325 MG per tablet Take 1 tablet by mouth every 6 (six) hours as needed for moderate pain. 20 tablet 0  . polyethylene glycol powder (GLYCOLAX/MIRALAX) powder Take 17 g by mouth daily. (Patient taking differently: Take 17 g by mouth at bedtime. Mix in 8 oz of water and drink) 1700 g 1  . potassium chloride SA (K-DUR,KLOR-CON) 20 MEQ tablet Take 1 tablet BID for 2 days, Take 1 tablet once a day for 4 days 15 tablet 0  . predniSONE (DELTASONE) 10 MG tablet Take 3 tabs daily 3 days, then 2 tabs daily 3 days, then 1 tab daily 3 days, then stop. 18 tablet 0  . predniSONE (DELTASONE) 20 MG tablet     . pregabalin (LYRICA) 50 MG capsule Take 1 capsule (50 mg total) by mouth 3 (three) times daily. 90 capsule 1  . Probiotic Product (PROBIOTIC DAILY PO) Take 1 tablet by mouth daily.     . ranitidine (ZANTAC) 150 MG tablet TAKE 1 TABLET TWICE DAILY 180 tablet 0  . saccharomyces boulardii (FLORASTOR) 250 MG capsule Take 1 capsule (250 mg total) by mouth 2 (two) times daily. 60 capsule 0  . Simethicone (GAS-X EXTRA STRENGTH PO) Take 1 tablet by mouth 3 (three) times daily after meals.     . sucralfate (CARAFATE) 1 G tablet Take 1 tablet (1 g total) by mouth 4 (four) times daily -  with meals and at bedtime. 28 tablet 0  . Vitamin D, Ergocalciferol, (DRISDOL) 50000 UNITS CAPS capsule Take 1 capsule (50,000 Units total) by mouth every 7 (seven) days. (Patient taking differently: Take 50,000 Units by mouth every 7 (seven) days. On Saturday) 12 capsule 3   No current facility-administered medications for this visit.     ROS:  See HPI  Physical Exam:  Filed Vitals:   11/15/14 1512  BP: 112/54  Pulse: 96  Temp: 98.9 F (37.2 C)  Resp: 18    Incision:  Right groin with slight separation  and purulent drainage from wound; left groin wound has healed nicely. Extremities:  Palpable graft pulse within the fem fem bypass graft.   Assessment/Plan:  This is a 69 y.o. female who is s/p: EVAR and subsequent right external iliac and common femoral endarterectomy with dacron patch angioplasty and right to left femorofemoral bypass with 48mm Hemashield graft 08/26/14 & 09/05/14 respectively.  Pt seen last Friday by Dr. Bridgett Larsson  -pt presents today with drainage from right groin.   -on Friday when Dr. Bridgett Larsson saw her, he elected observation and not exploration  -She denies any fevers.   -culture was taken of  the wound -she is given an electronic Rx for keflex 500mg  bid x 10 days.  She is also given diflucan 150mg  po x 1 with 2RF (she has requested this as she states she gets a yeast infection when she takes Abx) -pt is not a candidate for home health and therefore, RN has taught pt how to pack the wound daily.  -she will return to see Dr. Bridgett Larsson on Friday for re-evaluation.    Leontine Locket, PA-C [facility] [phone]  Clinic MD:  Pt seen and examined with Dr. Trula Slade    I agree with the above. The patient presented with an opening in her right femoral incision with some drainage over the weekend.  She was evaluated on Friday with a duplex which showed a fluid collection but no AV fistula or pseudoaneurysm the patient reports clear drainage.   On examination there is no obvious evidence of infection.  I did probe this area with a Q-tip.  It tracked about 5 mm.  No extra fluid was recovered.   A culture was sent.   The patient will be started on anti-biotics.  She was taught how to do wet-to-dry dressing changes.  She will follow-up this Friday for wound check.  She understands the gravity of this possible situation if the graft were to become infected. She is going to be very diligent about follow-up and wound care.  Annamarie Major

## 2014-11-15 NOTE — Telephone Encounter (Signed)
Phone call from pt.  Reported the cyst in right groin burst over the weekend.  Reported she spoke with the MD on call and was told to apply gauze dressings and change dressings as needed.  Stated she continues to have a yellow-green pus-type drainage oozing from right groin.  Reported some redness in the surrounding tissue.   Denied fever /chills.  Stated the swelling has decreased since the area started draining.  Discussed w/ Dr. Trula Slade.  Advised to bring in today for eval.  Appt. given for 3:00 PM today.  Agrees with plan.

## 2014-11-17 ENCOUNTER — Ambulatory Visit (INDEPENDENT_AMBULATORY_CARE_PROVIDER_SITE_OTHER): Payer: Medicare Other | Admitting: Internal Medicine

## 2014-11-17 ENCOUNTER — Other Ambulatory Visit: Payer: Self-pay | Admitting: *Deleted

## 2014-11-17 ENCOUNTER — Encounter: Payer: Self-pay | Admitting: Internal Medicine

## 2014-11-17 VITALS — BP 90/66 | HR 96 | Temp 98.3°F | Resp 16 | Ht 63.5 in | Wt 82.4 lb

## 2014-11-17 DIAGNOSIS — Z1239 Encounter for other screening for malignant neoplasm of breast: Secondary | ICD-10-CM

## 2014-11-17 DIAGNOSIS — Z780 Asymptomatic menopausal state: Secondary | ICD-10-CM | POA: Diagnosis not present

## 2014-11-17 DIAGNOSIS — Z13820 Encounter for screening for osteoporosis: Secondary | ICD-10-CM

## 2014-11-17 DIAGNOSIS — Z1231 Encounter for screening mammogram for malignant neoplasm of breast: Secondary | ICD-10-CM | POA: Diagnosis not present

## 2014-11-17 DIAGNOSIS — I7090 Unspecified atherosclerosis: Secondary | ICD-10-CM | POA: Diagnosis not present

## 2014-11-17 DIAGNOSIS — I729 Aneurysm of unspecified site: Secondary | ICD-10-CM

## 2014-11-17 DIAGNOSIS — Z0000 Encounter for general adult medical examination without abnormal findings: Secondary | ICD-10-CM

## 2014-11-17 DIAGNOSIS — J449 Chronic obstructive pulmonary disease, unspecified: Secondary | ICD-10-CM

## 2014-11-17 DIAGNOSIS — D649 Anemia, unspecified: Secondary | ICD-10-CM | POA: Diagnosis not present

## 2014-11-17 DIAGNOSIS — K862 Cyst of pancreas: Secondary | ICD-10-CM | POA: Diagnosis not present

## 2014-11-17 LAB — POCT URINALYSIS DIPSTICK
Bilirubin, UA: NEGATIVE
GLUCOSE UA: NEGATIVE
Nitrite, UA: NEGATIVE
PROTEIN UA: 100
RBC UA: NEGATIVE
Spec Grav, UA: 1.02
UROBILINOGEN UA: 0.2
pH, UA: 5.5

## 2014-11-17 MED ORDER — ZOSTER VACCINE LIVE 19400 UNT/0.65ML ~~LOC~~ SOLR: 0.6500 mL | Freq: Once | SUBCUTANEOUS | Status: DC

## 2014-11-17 NOTE — Progress Notes (Addendum)
Subjective:    Patient ID: Cynthia Bowen, female    DOB: [birth year], 69 y.o.   MRN: [id number]  HPICPE Recent surgery as noted with 2ndary infection now on Keflex but better from spontaneous drainage Patient Active Problem List   Diagnosis Date Noted  . Recurrent abdominal pain--multiple causes, status post GI surgery with multiple complications 0086 76/19/5093    MUCH IMPROVED--still occas pain BMs regular//able to eat well  . Atherosclerosis of native arteries of extremity with intermittent claudication///Abdominal aortic stenosis// schemia of lower extremity  PAD (peripheral artery disease)  BYPASS GRAFT FEMORAL-FEMORAL ARTERY 09/04/14 AORTIC ENDOVASCULAR  COVERED STENT 08/26/14 with Wound drainage-Right groin current///cult pending on keflex Femoral artery stenosis-bilateral first noted CT at [facility] 2015//prog to int claud this winter-spring leading to surgery   01/15/2014  . Pancreatic lesion-6 mm noted on MRI at [facility] f/u plan///02/25/14 repeat mri here says pancreas ok 10/08/2013  . Protein calorie malnutrition 01/15/2013  . Depression with anxiety 04/02/2011  . Clostridium difficile diarrhea 06/03/2014  . Cellulitis of hand 06/02/2014  . Hypokalemia 06/02/2014  . Osteoporosis 05/22/2014  . Nephrolithiasis-asymptomatic/noted on scan 10/08/2013  . Other and unspecified ovarian cyst--noted on scans 2014 2015/asymptomatic 10/08/2013  . PE (pulmonary embolism) 08/05/2012  . Incarcerated epigastric hernia s/p primary repair 07/02/2012 07/11/2012  . 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 [id number] 07/10/2012  . Pericardial effusion 04/17/2012  . COPD GOLD I---asympto but see pulm notes 01/29/2012  . Back pain DDD S/p surgery [place] 1993 12/29/2011  . GERD (gastroesophageal reflux disease) 04/02/2011  . Osteoarthritis 04/02/2011  . Cervical neck pain with evidence of disc disease-s/p surgery [id number] 04/02/2011  .  Fibromyalgia-[place]war 2004 04/02/2011   Past Surgical History  Procedure Laterality Date  . Abdominal hysterectomy    . Esophagogastroduodenoscopy  04/18/2012    Procedure: ESOPHAGOGASTRODUODENOSCOPY (EGD);  Surgeon: Inda Castle, MD;  Location: [facility];  Service: Endoscopy;  Laterality: N/A;  . Eus  05/29/2012    Procedure: UPPER ENDOSCOPIC ULTRASOUND (EUS) LINEAR;  Surgeon: Milus Banister, MD;  Location: [facility];  Service: Endoscopy;  Laterality: N/A;  . Appendectomy    . Spine surgery      CERVICAL SPINE SURGERY X 2 - INCLUDING FUSION; LUMBAR SURGERY FOR RUPTURED DISC  . Eye surgery      BILATERAL CATARACT EXTRACTIONS  . Laparoscopic abdominal exploration N/A 07/02/2012    Procedure: converted to laparotomy with gastric biopsy;  Surgeon: Imogene Burn. Georgette Dover, MD;  Location: [facility];  Service: General;  Laterality: N/A;  Laparoscopic Gastric Biospy attempted.   . Laparotomy N/A 07/07/2012    Procedure: EXPLORATORY LAPAROTOMY repair of gastric perforation with omental graham patch, drainage of abdominal abcess;  Surgeon: Imogene Burn. Georgette Dover, MD;  Location: [facility];  Service: General;  Laterality: N/A;  . Stomach surgery  07/07/2012    Omental patch of gastric perforation  . Laparotomy N/A 07/13/2012    Procedure: EXPLORATORY LAPAROTOMY repair gastric leak;  Surgeon: Edward Jolly, MD;  Location: [facility];  Service: General;  Laterality: N/A;  . Laparotomy N/A 11/11/2012    Procedure: EXPLORATORY LAPAROTOMY;  Surgeon: Imogene Burn. Georgette Dover, MD;  Location: [place];  Service: General;  Laterality: N/A;  . Bowel resection N/A 11/11/2012    Procedure: SMALL BOWEL RESECTION;  Surgeon: Imogene Burn. Georgette Dover, MD;  Location: [place];  Service: General;  Laterality: N/A;  . Minor application of wound vac N/A 11/11/2012    Procedure: APPLICATION OF WOUND VAC;  Surgeon:  Imogene Burn. Georgette Dover, MD;  Location: [place];  Service: General;  Laterality: N/A;  . Jejunostomy N/A 11/11/2012    Procedure: PLACEMENT OF FEEDING  JEJUNOSTOMY TUBE;  Surgeon: Imogene Burn. Georgette Dover, MD;  Location: [place];  Service: General;  Laterality: N/A;  . Lysis of adhesion N/A 11/11/2012    Procedure: LYSIS OF ADHESION;  Surgeon: Imogene Burn. Georgette Dover, MD;  Location: [place];  Service: General;  Laterality: N/A;  . Hernia repair    . Gastric fistula repair      05/16/2013  . Colonoscopy    . Polypectomy    . Upper gastrointestinal endoscopy    . Abdominal aortagram N/A 08/19/2014    Procedure: ABDOMINAL Maxcine Ham;  Surgeon: Conrad Hampstead, MD;  Location: [facility] CATH LAB;  Service: Cardiovascular;  Laterality: N/A;  . Abdominal aortic endovascular stent graft N/A 08/26/2014    Procedure: AORTIC ENDOVASCULAR  COVERED STENT ;  Surgeon: Conrad Smithville, MD;  Location: [place];  Service: Vascular;  Laterality: N/A;  IVUS  . Femoral-femoral bypass graft Bilateral 09/04/2014    Procedure: Right External Iliac and right comon femoral endarterectomy with patching and right to left BYPASS GRAFT FEMORAL-FEMORAL ARTERY, ;  Surgeon: Rosetta Posner, MD;  Location: [facility] OR;  Service: Vascular;  Laterality: Bilateral;    Current outpatient prescriptions:   .  albuterol (PROVENTIL HFA;VENTOLIN HFA) not needing  .  cephALEXin (KEFLEX) 500 MG capsule, Take 1 capsule (500 mg total) by mouth 2 (two) times daily.,  -   dicyclomine (BENTYL) 20 MG tablet, TAKE 1 TABLET (20 MG TOTAL) BY MOUTH 4 (FOUR) TIMES DAILY - BEFORE MEALS AND AT  BEDTIME.---takes only for cramping//working well  .  ketotifen (ZADITOR) 0.025 % ophthalmic solution, Place 1 drop into both eyes 2 (two) times daily  .  ondansetron (ZOFRAN) 4 MG tablet, TAKE 1 TABLET (4 MG TOTAL) BY MOUTH EVERY 8 (EIGHT) HOURS AS NEEDED FOR NAUSEA  OR VOMITING.--occas only  .  ranitidine (ZANTAC) 150 MG tablet, TAKE 1 TABLET TWICE DAILY---prn  .  Simethicone (GAS-X EXTRA STRENGTH PO), Take 1 tablet by mouth 3 (three) times daily after meals.prn  .  Vitamin D, Ergocalciferol, (DRISDOL) 50000 UNITS CAPS capsule, Take 1 capsule  (50,000 Units total) by mouth every 7 (seven) days. (Patient taking differently: Take 50,000 Units by mouth every 7 (seven) days.--nearing 3 mos rx  IMM utd Time for mammo/dexa Describes herself as the black sheep and her family. A loner. She is about to go to a family reunion and see her sister and others. There are no history of falls Her depression is better than it has been several years  Review of Systems 14 point review of systems does not include any new symptoms not covered in the present illness   -except: Since her recent surgery she tires easily and feels like her legs are heavy Her appetite is changed for the better She still has occasional otalgia eardrops for cream She continues eyedrops or conjunctival irritation There is a history of chronic cough incisions related to certain foods times she feels like choking. Note that she also has COPD Her abdomen abdominal pain is finally disappearing this long distance from her surgery She does feel cold all the time she has lost weight. She occasionally feels dizzy after some meds that she has tried to eliminate as many as possible she has not had any falls Objective:   Physical Exam  Constitutional: She is oriented to person, place, and time. She  appears well-developed and well-nourished. No distress.  HENT:  Head: Normocephalic and atraumatic.  Right Ear: External ear normal.  Left Ear: External ear normal.  Nose: Nose normal.  Mouth/Throat: Oropharynx is clear and moist.  Eyes: Conjunctivae and EOM are normal. Pupils are equal, round, and reactive to light.  Neck: Normal range of motion. Neck supple. No thyromegaly present.  Cardiovascular: Normal rate, regular rhythm, normal heart sounds and intact distal pulses.   No murmur heard. No carotid bruits  Pulmonary/Chest: Effort normal. No respiratory distress. She has no wheezes.  Breath sounds are distant and excursion shallow.  Abdominal: She exhibits no distension and no  mass. There is no tenderness. There is no rebound and no guarding.  Musculoskeletal: Normal range of motion. She exhibits no edema.  Lymphadenopathy:    She has no cervical adenopathy.  Neurological: She is alert and oriented to person, place, and time. She has normal reflexes. No cranial nerve deficit.  Skin: Skin is warm and dry.  Wound not examined  Psychiatric: She has a normal mood and affect. Her behavior is normal. Judgment and thought content normal.  Nursing note and vitals reviewed.  BP 90/66 mmHg  Pulse 96  Temp(Src) 98.3 F (36.8 C) (Oral)  Resp 16  Ht 5' 3.5" (1.613 m)  Wt 82 lb 6.4 oz (37.376 kg)  BMI 14.37 kg/m2  Pulmonary function studies reveal severe obstruction as detailed by pulmonology in the past.      Assessment & Plan:  Annual physical exam -  Pancreatic lesion-6 mm noted on MRI at [facility]/asymptomatic//follow-up exam by MRI negative in [place].  Anemia, unspecified anemia type - Plan: CBC with Differential/Platelet  Arteriosclerosis - Plan: Lipid panel  Screening for osteoporosis - Plan: DG Bone Density Post-menopause - Plan: DG Bone Density  Screening for breast cancer-- Screening for malignant neoplasm of breast Encounter for screening mammogram for malignant neoplasm of breast - Plan: MM Digital Screening  COPD mixed type--even though this is severe she is asymptomatic probably because of a low activity level and she is reluctant use an inhaler every day  Anemia-normocytic stable/b12/folate wml 3/16  To F/u with VS this week  Meds ordered this encounter  Medications  . zoster vaccine live, PF, (ZOSTAVAX) 33295 UNT/0.65ML injection    Sig: Inject 19,400 Units into the skin once. Administer at pharmacy    Dispense:  1 each    Refill:  0   Results for orders placed or performed in visit on 11/17/14  CBC with Differential/Platelet  Result Value Ref Range   WBC 8.2 4.0 - 10.5 K/uL   RBC 3.85 (L) 3.87 - 5.11 MIL/uL   Hemoglobin  10.7 (L) 12.0 - 15.0 g/dL   HCT 32.9 (L) 36.0 - 46.0 %   MCV 85.5 78.0 - 100.0 fL   MCH 27.8 26.0 - 34.0 pg   MCHC 32.5 30.0 - 36.0 g/dL   RDW 15.8 (H) 11.5 - 15.5 %   Platelets 310 150 - 400 K/uL   MPV 9.5 8.6 - 12.4 fL   Neutrophils Relative % 71 43 - 77 %   Neutro Abs 5.8 1.7 - 7.7 K/uL   Lymphocytes Relative 24 12 - 46 %   Lymphs Abs 2.0 0.7 - 4.0 K/uL   Monocytes Relative 5 3 - 12 %   Monocytes Absolute 0.4 0.1 - 1.0 K/uL   Eosinophils Relative 0 0 - 5 %   Eosinophils Absolute 0.0 0.0 - 0.7 K/uL   Basophils Relative 0 0 -  1 %   Basophils Absolute 0.0 0.0 - 0.1 K/uL   Smear Review Criteria for review not met   Lipid panel  Result Value Ref Range   Cholesterol 107 (L) 125 - 200 mg/dL   Triglycerides 75 <150 mg/dL   HDL 60 >=46 mg/dL   Total CHOL/HDL Ratio 1.8 <=5.0 Ratio   VLDL 15 <30 mg/dL   LDL Cholesterol 32 <130 mg/dL  POCT urinalysis dipstick  Result Value Ref Range   Color, UA yellow    Clarity, UA cloudy    Glucose, UA neg    Bilirubin, UA neg    Ketones, UA trace    Spec Grav, UA 1.020    Blood, UA neg    pH, UA 5.5    Protein, UA 100    Urobilinogen, UA 0.2    Nitrite, UA neg    Leukocytes, UA small (1+) (A) Negative

## 2014-11-18 ENCOUNTER — Other Ambulatory Visit: Payer: Self-pay

## 2014-11-18 DIAGNOSIS — E2839 Other primary ovarian failure: Secondary | ICD-10-CM

## 2014-11-18 LAB — WOUND CULTURE
Gram Stain: NONE SEEN
Organism ID, Bacteria: NO GROWTH

## 2014-11-18 LAB — LIPID PANEL
CHOL/HDL RATIO: 1.8 ratio (ref <=5.0)
Cholesterol: 107 mg/dL — ABNORMAL LOW (ref 125–200)
HDL: 60 mg/dL (ref >=46)
LDL Cholesterol: 32 mg/dL (ref <130)
Triglycerides: 75 mg/dL (ref <150)
VLDL: 15 mg/dL (ref <30)

## 2014-11-18 LAB — CBC WITH DIFFERENTIAL/PLATELET
BASOS ABS: 0 10*3/uL (ref 0.0–0.1)
Basophils Relative: 0 % (ref 0–1)
EOS PCT: 0 % (ref 0–5)
Eosinophils Absolute: 0 10*3/uL (ref 0.0–0.7)
HEMATOCRIT: 32.9 % — AB (ref 36.0–46.0)
HEMOGLOBIN: 10.7 g/dL — AB (ref 12.0–15.0)
Lymphocytes Relative: 24 % (ref 12–46)
Lymphs Abs: 2 10*3/uL (ref 0.7–4.0)
MCH: 27.8 pg (ref 26.0–34.0)
MCHC: 32.5 g/dL (ref 30.0–36.0)
MCV: 85.5 fL (ref 78.0–100.0)
MONOS PCT: 5 % (ref 3–12)
MPV: 9.5 fL (ref 8.6–12.4)
Monocytes Absolute: 0.4 10*3/uL (ref 0.1–1.0)
Neutro Abs: 5.8 10*3/uL (ref 1.7–7.7)
Neutrophils Relative %: 71 % (ref 43–77)
PLATELETS: 310 10*3/uL (ref 150–400)
RBC: 3.85 MIL/uL — ABNORMAL LOW (ref 3.87–5.11)
RDW: 15.8 % — AB (ref 11.5–15.5)
WBC: 8.2 10*3/uL (ref 4.0–10.5)

## 2014-11-19 ENCOUNTER — Other Ambulatory Visit: Payer: Self-pay | Admitting: *Deleted

## 2014-11-19 ENCOUNTER — Other Ambulatory Visit: Payer: Self-pay

## 2014-11-19 ENCOUNTER — Encounter: Payer: Self-pay | Admitting: Vascular Surgery

## 2014-11-19 ENCOUNTER — Encounter (HOSPITAL_COMMUNITY): Payer: Medicare Other

## 2014-11-19 ENCOUNTER — Ambulatory Visit (INDEPENDENT_AMBULATORY_CARE_PROVIDER_SITE_OTHER): Payer: Self-pay | Admitting: Vascular Surgery

## 2014-11-19 VITALS — BP 133/81 | HR 88 | Temp 98.6°F | Resp 16 | Ht 63.0 in | Wt 82.0 lb

## 2014-11-19 DIAGNOSIS — I70219 Atherosclerosis of native arteries of extremities with intermittent claudication, unspecified extremity: Secondary | ICD-10-CM

## 2014-11-19 DIAGNOSIS — Z1231 Encounter for screening mammogram for malignant neoplasm of breast: Secondary | ICD-10-CM

## 2014-11-19 DIAGNOSIS — S31109A Unspecified open wound of abdominal wall, unspecified quadrant without penetration into peritoneal cavity, initial encounter: Secondary | ICD-10-CM

## 2014-11-19 NOTE — Progress Notes (Signed)
Postoperative Visit   History of Present Illness  ROSENDA Bowen is a 69 y.o. (07/10/1945) female  who presents for postoperative follow-up for:  1. 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 R iliofem EA w/ DPA, R to L fem-fem BPG (Dr. Donnetta Hutching) on 09/05/14 for L CIA stent occlusion  Pt's rt groin mass drained spontaneously.  Pt has been doing dry dressing to R groin.  She is not clear if blood vs serous drainage occurred.  Current Outpatient Prescriptions  Medication Sig Dispense Refill  . cephALEXin (KEFLEX) 500 MG capsule Take 1 capsule (500 mg total) by mouth 2 (two) times daily. 30 capsule 0  . dicyclomine (BENTYL) 20 MG tablet TAKE 1 TABLET (20 MG TOTAL) BY MOUTH 4 (FOUR) TIMES DAILY - BEFORE MEALS AND AT BEDTIME. 60 tablet 3  . fluconazole (DIFLUCAN) 150 MG tablet Take 1 tablet (150 mg total) by mouth once. 1 tablet 2  . fluocinonide cream (LIDEX) 0.93 % Apply 1 application topically 2 (two) times daily as needed (rash). (Patient taking differently: Apply 1 application topically 2 (two) times daily as needed (eczema). )    . ketotifen (ZADITOR) 0.025 % ophthalmic solution Place 1 drop into both eyes 2 (two) times daily.    . ondansetron (ZOFRAN) 4 MG tablet TAKE 1 TABLET (4 MG TOTAL) BY MOUTH EVERY 8 (EIGHT) HOURS AS NEEDED FOR NAUSEA OR VOMITING. (Patient taking differently: Take 4 mg by mouth every 8 (eight) hours as needed for nausea or vomiting. ) 60 tablet 1  . ranitidine (ZANTAC) 150 MG tablet TAKE 1 TABLET TWICE DAILY 180 tablet 0  . Simethicone (GAS-X EXTRA STRENGTH PO) Take 1 tablet by mouth 3 (three) times daily after meals.     . Vitamin D, Ergocalciferol, (DRISDOL) 50000 UNITS CAPS capsule Take 1 capsule (50,000 Units total) by mouth every 7 (seven) days. (Patient taking differently: Take 50,000 Units by mouth every 7 (seven) days. On Saturday) 12 capsule 3  . clidinium-chlordiazePOXIDE (LIBRAX) 5-2.5 MG per  capsule     . feeding supplement, RESOURCE BREEZE, (RESOURCE BREEZE) LIQD Take 1 Container by mouth 3 (three) times daily between meals.    . fentaNYL (DURAGESIC - DOSED MCG/HR) 25 MCG/HR patch     . LYRICA 50 MG capsule     . oxyCODONE-acetaminophen (PERCOCET/ROXICET) 5-325 MG per tablet     . PROAIR HFA 108 (90 BASE) MCG/ACT inhaler     . saccharomyces boulardii (FLORASTOR) 250 MG capsule Take 1 capsule (250 mg total) by mouth 2 (two) times daily. (Patient not taking: Reported on 11/17/2014) 60 capsule 0  . zoster vaccine live, PF, (ZOSTAVAX) 23557 UNT/0.65ML injection Inject 19,400 Units into the skin once. Administer at pharmacy (Patient not taking: Reported on 11/19/2014) 1 each 0   No current facility-administered medications for this visit.    For VQI Use Only  PRE-ADM LIVING: Home  AMB STATUS: Ambulatory  Physical Examination  Filed Vitals:   11/19/14 1014  BP: 133/81  Pulse: 88  Temp: 98.6 F (37 C)  Resp: 16  Height: 5\' 3"  (1.6 m)  Weight: 82 lb (37.195 kg)   BLE: B groin healed, fem-fem graft palpable, no palpable pedal pulses, ballotable mass gone, small skin defect with ?viable fat evident, no drainage, no erythema, no pus or smell, no graft in wound  Medical Decision Making  Cynthia Bowen is a 69 y.o. (January 02, 1946) female  who presents  s/p R iliofem EA w/ DPA, R to L fem-fem BPG, Aortic stenting for near occlusion, L CIA stent occlusion, spont. 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  Home health wet-to-dry dressing daily  I emphasized the importance of abx as the fem-fem BPG is immediately adjacent to the wound.  i also emphasized the importance of eating, as I suspect part of her difficulty healing has been her malnutrition related to her prior GI surgeries.  Follow up weekly until healed.  Thank you for allowing [place] to participate in this patient's care.  Adele Barthel, MD [facility] [place] Office: [phone] Pager: [phone]  11/19/2014, 10:30 AM

## 2014-11-20 ENCOUNTER — Encounter: Payer: Self-pay | Admitting: Internal Medicine

## 2014-11-24 ENCOUNTER — Ambulatory Visit: Payer: Medicare Other

## 2014-11-24 ENCOUNTER — Inpatient Hospital Stay: Admission: RE | Admit: 2014-11-24 | Payer: Medicare Other | Source: Ambulatory Visit

## 2014-11-25 ENCOUNTER — Encounter: Payer: Self-pay | Admitting: Vascular Surgery

## 2014-11-26 ENCOUNTER — Ambulatory Visit
Admission: RE | Admit: 2014-11-26 | Discharge: 2014-11-26 | Disposition: A | Discharge status: Home / Self Care | Payer: Medicare Other | Source: Ambulatory Visit | Attending: Internal Medicine | Admitting: Internal Medicine

## 2014-11-26 ENCOUNTER — Ambulatory Visit (INDEPENDENT_AMBULATORY_CARE_PROVIDER_SITE_OTHER): Payer: Self-pay | Admitting: Vascular Surgery

## 2014-11-26 ENCOUNTER — Encounter: Payer: Self-pay | Admitting: Vascular Surgery

## 2014-11-26 VITALS — BP 117/71 | HR 79 | Temp 98.6°F | Resp 16 | Ht 64.0 in | Wt 88.1 lb

## 2014-11-26 DIAGNOSIS — Z1231 Encounter for screening mammogram for malignant neoplasm of breast: Secondary | ICD-10-CM

## 2014-11-26 DIAGNOSIS — Q251 Coarctation of aorta: Secondary | ICD-10-CM

## 2014-11-26 DIAGNOSIS — E43 Unspecified severe protein-calorie malnutrition: Secondary | ICD-10-CM

## 2014-11-26 DIAGNOSIS — I70219 Atherosclerosis of native arteries of extremities with intermittent claudication, unspecified extremity: Secondary | ICD-10-CM

## 2014-11-26 DIAGNOSIS — E2839 Other primary ovarian failure: Secondary | ICD-10-CM

## 2014-11-26 DIAGNOSIS — Q253 Supravalvular aortic stenosis: Secondary | ICD-10-CM

## 2014-11-26 MED ORDER — AMOXICILLIN-POT CLAVULANATE 875-125 MG PO TABS: 1.0000 | ORAL_TABLET | Freq: Two times a day (BID) | ORAL | Status: DC

## 2014-11-26 NOTE — Progress Notes (Signed)
Postoperative Visit   History of Present Illness  Cynthia Bowen is a 69 y.o. (1945/12/12) female who presents for postoperative follow-up for:  1. 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 R iliofem EA w/ DPA, R to L fem-fem BPG (Dr. Donnetta Hutching) on 09/05/14 for L CIA stent occlusion  Pt has been doing dry dressing to R groin. She thinks purulent drainage has been occurring. Current Outpatient Prescriptions  Medication Sig Dispense Refill  . cephALEXin (KEFLEX) 500 MG capsule Take 1 capsule (500 mg total) by mouth 2 (two) times daily. 30 capsule 0  . clidinium-chlordiazePOXIDE (LIBRAX) 5-2.5 MG per capsule     . dicyclomine (BENTYL) 20 MG tablet TAKE 1 TABLET (20 MG TOTAL) BY MOUTH 4 (FOUR) TIMES DAILY - BEFORE MEALS AND AT BEDTIME. 60 tablet 3  . feeding supplement, RESOURCE BREEZE, (RESOURCE BREEZE) LIQD Take 1 Container by mouth 3 (three) times daily between meals.    . fentaNYL (DURAGESIC - DOSED MCG/HR) 25 MCG/HR patch     . ketotifen (ZADITOR) 0.025 % ophthalmic solution Place 1 drop into both eyes 2 (two) times daily.    Marland Kitchen LYRICA 50 MG capsule     . oxyCODONE-acetaminophen (PERCOCET/ROXICET) 5-325 MG per tablet     . PROAIR HFA 108 (90 BASE) MCG/ACT inhaler     . ranitidine (ZANTAC) 150 MG tablet TAKE 1 TABLET TWICE DAILY 180 tablet 0  . Simethicone (GAS-X EXTRA STRENGTH PO) Take 1 tablet by mouth 3 (three) times daily after meals.     . Vitamin D, Ergocalciferol, (DRISDOL) 50000 UNITS CAPS capsule Take 1 capsule (50,000 Units total) by mouth every 7 (seven) days. (Patient taking differently: Take 50,000 Units by mouth every 7 (seven) days. On Saturday) 12 capsule 3  . zoster vaccine live, PF, (ZOSTAVAX) 24825 UNT/0.65ML injection Inject 19,400 Units into the skin once. Administer at pharmacy 1 each 0  . amoxicillin-clavulanate (AUGMENTIN) 875-125 MG per tablet Take 1 tablet by mouth every 12 (twelve) hours. 20 tablet 0    No current facility-administered medications for this visit.    For VQI Use Only  PRE-ADM LIVING: Home  AMB STATUS: Ambulatory  Physical Examination  Filed Vitals:   11/26/14 1313  BP: 117/71  Pulse: 79  Temp: 98.6 F (37 C)  TempSrc: Oral  Resp: 16  Height: 5\' 4"  (1.626 m)  Weight: 88 lb 1.6 oz (39.962 kg)  SpO2: 95%     BLE: B groin healed, fem-fem graft palpable, no palpable pedal pulses, pinhole in R groin with some ?fatty necrosis vs purulence, no erythema, no smell, no evidence of infection of fem-fem graft, no TTP  Medical Decision Making  Cynthia Bowen is a 69 y.o. (March 08, 1946) female  who presents s/p R iliofem EA w/ DPA, R to L fem-fem BPG, Aortic stenting for near occlusion, L CIA stent occlusion, spont. drainage of R groin mass   Switching abx to Augmentin DS 1 po BID x 10 days  Home health wet-to-dry dressing daily  I emphasized the importance of abx as the fem-fem BPG is immediately adjacent to the wound.  i also emphasized the importance of eating, as I suspect part of her difficulty healing has been her malnutrition related to her prior GI surgeries.  Follow up weekly until healed.  Unfortunately, if this fem-fem graft gets infected.  Pt may need more drastic options including consideration of a femorofemoral graft with cryovein vs  deep femoral vein vs L ax-fem BPG  Thank you for allowing [place] to participate in this patient's care.  Adele Barthel, MD [facility] [place] Office: [phone] Pager: [phone]  11/26/2014, 1:36 PM

## 2014-11-29 ENCOUNTER — Other Ambulatory Visit: Payer: Self-pay | Admitting: Internal Medicine

## 2014-11-29 DIAGNOSIS — R928 Other abnormal and inconclusive findings on diagnostic imaging of breast: Secondary | ICD-10-CM

## 2014-12-02 ENCOUNTER — Encounter: Payer: Self-pay | Admitting: Vascular Surgery

## 2014-12-03 ENCOUNTER — Ambulatory Visit: Payer: Medicare Other | Admitting: Vascular Surgery

## 2014-12-03 ENCOUNTER — Encounter (HOSPITAL_COMMUNITY): Payer: Medicare Other

## 2014-12-03 ENCOUNTER — Encounter: Payer: Self-pay | Admitting: Vascular Surgery

## 2014-12-03 ENCOUNTER — Ambulatory Visit (INDEPENDENT_AMBULATORY_CARE_PROVIDER_SITE_OTHER): Payer: Self-pay | Admitting: Vascular Surgery

## 2014-12-03 VITALS — BP 107/64 | HR 77 | Temp 98.8°F | Ht 64.0 in | Wt 86.0 lb

## 2014-12-03 DIAGNOSIS — I70219 Atherosclerosis of native arteries of extremities with intermittent claudication, unspecified extremity: Secondary | ICD-10-CM

## 2014-12-03 NOTE — Progress Notes (Signed)
    Postoperative Visit   History of Present Illness  Cynthia Bowen is a 69 y.o. ([birth year]) female  who presents for postoperative follow-up for:  1. 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 R iliofem EA w/ DPA, R to L fem-fem BPG (Dr. Donnetta Hutching) on 09/05/14 for L CIA stent occlusion  Pt has been doing dry dressing to R groin. No further drainage is occurring.  No fever or chills.  She has almost finished her doses of Augmentin.  For VQI Use Only  PRE-ADM LIVING: Home  AMB STATUS: Ambulatory   Physical Examination  Filed Vitals:   12/03/14 1428  BP: 107/64  Pulse: 77  Temp: 98.8 F (37.1 C)  TempSrc: Oral  Height: 5\' 4"  (1.626 m)  Weight: 86 lb (39.009 kg)  SpO2: 98%   BLE: B groin healed, fem-fem graft palpable, no palpable pedal pulses, R groin pinhole healed, no erythema, no smell, no evidence of infection of fem-fem graft, no TTP   Medical Decision Making  Cynthia Bowen is a 69 y.o. ([birth year]) female  who presents s/p R iliofem EA w/ DPA, R to L fem-fem BPG, Aortic stenting for near occlusion, L CIA stent occlusion, resolved spont. drainage of R groin mass   I again emphasized the importance of eating, as I suspect part of her difficulty healing has been her malnutrition related to her prior GI surgeries.  Follow up next week to make certain her groin is healed.    Thank you for allowing [place] to participate in this patient's care.  Adele Barthel, MD [facility] Office: [phone] Pager: [phone]  12/03/2014, 2:52 PM

## 2014-12-06 ENCOUNTER — Ambulatory Visit
Admission: RE | Admit: 2014-12-06 | Discharge: 2014-12-06 | Disposition: A | Discharge status: Home / Self Care | Payer: Medicare Other | Source: Ambulatory Visit | Attending: Internal Medicine | Admitting: Internal Medicine

## 2014-12-06 DIAGNOSIS — R928 Other abnormal and inconclusive findings on diagnostic imaging of breast: Secondary | ICD-10-CM

## 2014-12-09 ENCOUNTER — Encounter: Payer: Self-pay | Admitting: Vascular Surgery

## 2014-12-10 ENCOUNTER — Ambulatory Visit (INDEPENDENT_AMBULATORY_CARE_PROVIDER_SITE_OTHER): Payer: Medicare Other | Admitting: Vascular Surgery

## 2014-12-10 ENCOUNTER — Encounter: Payer: Self-pay | Admitting: Vascular Surgery

## 2014-12-10 VITALS — BP 94/55 | HR 110 | Temp 98.6°F | Ht 64.0 in | Wt 83.1 lb

## 2014-12-10 DIAGNOSIS — I70219 Atherosclerosis of native arteries of extremities with intermittent claudication, unspecified extremity: Secondary | ICD-10-CM

## 2014-12-10 NOTE — Progress Notes (Signed)
    Postoperative Visit   History of Present Illness  Cynthia Bowen is a 69 y.o. (1945/09/30) female who presents for postoperative follow-up for:  1. 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 R iliofem EA w/ DPA, R to L fem-fem BPG (Dr. Donnetta Hutching) on 09/05/14 for L CIA stent occlusion  No further drainage from R groin No fever or chills but new new fluid collection around L groin  For VQI Use Only  PRE-ADM LIVING: Home  AMB STATUS: Ambulatory   Physical Examination  Filed Vitals:   12/10/14 0936  BP: 94/55  Pulse: 110  Temp: 98.6 F (37 C)  TempSrc: Oral  Height: 5\' 4"  (1.626 m)  Weight: 83 lb 1.6 oz (37.694 kg)  SpO2: 100%    BLE: B groin healed, fem-fem graft palpable, no palpable pedal pulses, R groin healed, small ballotable fluid collection inferior to fem-fem graft, ~3 cm medial to left groin anastomosis, no erythema, no TTP   Medical Decision Making  Cynthia Bowen is a 69 y.o. (05-21-1945) female who presents s/p R iliofem EA w/ DPA, R to L fem-fem BPG, Aortic stenting for near occlusion, L CIA stent occlusion, resolved spont. drainage of R groin mass, new mass L groin, protein malnutrition related to poor intake due to GI issues related to gastric ulcer perforation   Pt has new fluid collection adjacent to fem-fem graft.  I wonder if she is developing infection of the fem-fem graft or transudative drainage from the graft due to poor healing.  Again, I am reluctant to drain this fluid collection as the graft is in close proximity.  She has no obvious signs of infection at this point.    I again emphasized the importance of eating, as I suspect part of her difficulty healing has been her malnutrition related to her prior GI surgeries.  Follow up next week to recheck the L side of fem-fem graft.  Thank you for allowing [place] to participate in this patient's care.  Adele Barthel, MD [facility] [place] Office: [phone] Pager: [phone]  12/10/2014, 9:49 AM

## 2014-12-15 DIAGNOSIS — G894 Chronic pain syndrome: Secondary | ICD-10-CM | POA: Insufficient documentation

## 2014-12-16 ENCOUNTER — Encounter: Payer: Self-pay | Admitting: Vascular Surgery

## 2014-12-17 ENCOUNTER — Ambulatory Visit (INDEPENDENT_AMBULATORY_CARE_PROVIDER_SITE_OTHER): Payer: Self-pay | Admitting: Vascular Surgery

## 2014-12-17 ENCOUNTER — Encounter: Payer: Self-pay | Admitting: Vascular Surgery

## 2014-12-17 VITALS — BP 97/79 | HR 102 | Temp 98.5°F | Ht 64.0 in | Wt 83.6 lb

## 2014-12-17 DIAGNOSIS — I70219 Atherosclerosis of native arteries of extremities with intermittent claudication, unspecified extremity: Secondary | ICD-10-CM

## 2014-12-17 NOTE — Progress Notes (Signed)
    Postoperative Visit   History of Present Illness  Cynthia Bowen is a 69 y.o. (1946-01-21) female who presents for postoperative follow-up for:  1. 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 R iliofem EA w/ DPA, R to L fem-fem BPG (Dr. Donnetta Hutching) on 09/05/14 for L CIA stent occlusion  No fever or chills.  Continued small fluid collection overlying L side of fem-fem graft.   For VQI Use Only  PRE-ADM LIVING: Home  AMB STATUS: Ambulatory   Physical Examination  Filed Vitals:   12/17/14 0928  BP: 97/79  Pulse: 102  Temp: 98.5 F (36.9 C)  TempSrc: Oral  Height: 5\' 4"  (1.626 m)  Weight: 83 lb 9.6 oz (37.921 kg)  SpO2: 100%   BLE: B groin healed, fem-fem graft palpable, no palpable pedal pulses, R groin healed, small ballotable fluid collection inferior to fem-fem graft (appears smaller todaY), ~3 cm medial to left groin anastomosis, no erythema, no TTP   Medical Decision Making  Cynthia Bowen is a 69 y.o. (08/06/1945) female who presents s/p R iliofem EA w/ DPA, R to L fem-fem BPG, Aortic stenting for near occlusion, L CIA stent occlusion, resolved spont. drainage of R groin mass, new mass L groin, protein malnutrition related to poor intake due to GI issues related to gastric ulcer perforation   Again, I am reluctant to drain this fluid collection as the graft is in close proximity.  She has no obvious signs of infection at this point.   I again emphasized the importance of eating, as I suspect part of her difficulty healing has been her malnutrition related to her prior GI surgeries.  Follow up next week to recheck the L side of fem-fem graft and for surveillance studies  Thank you for allowing [place] to participate in this patient's care.  Adele Barthel, MD [facility] [place] Office: [phone] Pager: [phone]  12/17/2014, 9:51 AM

## 2014-12-21 ENCOUNTER — Ambulatory Visit (INDEPENDENT_AMBULATORY_CARE_PROVIDER_SITE_OTHER): Payer: Medicare Other | Admitting: Internal Medicine

## 2014-12-21 VITALS — BP 98/60 | HR 86 | Temp 98.8°F | Resp 17 | Ht 63.5 in | Wt 88.6 lb

## 2014-12-21 DIAGNOSIS — Z23 Encounter for immunization: Secondary | ICD-10-CM | POA: Diagnosis not present

## 2014-12-21 DIAGNOSIS — N63 Unspecified lump in unspecified breast: Secondary | ICD-10-CM

## 2014-12-21 NOTE — Progress Notes (Signed)
Subjective:  This chart was scribed for Cynthia Lin, MD by Leandra Kern, Medical Scribe. This patient was seen in Room 11 and the patient's care was started at 4:04 PM.   Patient ID: Cynthia Bowen, female    DOB: [birth year], 69 y.o.   MRN: [id number]  Chief Complaint  Patient presents with  . Follow-up    test results    HPI HPI Comments: Cynthia Bowen is a 69 y.o. female who presents to [facility] for a follow up regarding test results.  Pt is inquiring about the biopsy that she is scheduled to have done. Pt had a Mammogram done on 08/05 that showed that there was some calcifications on the right breast. Pt returned on 08/15 for further testing where they found small groups of calcifications of about 6 mm.  Her main recent problem has been recovery from arterial surgery aortofemoral bypass having wound complications that are ongoing. F/u at [facility]. Pt notes that she is doing well with walking, however she reports that it frustrates her when her legs give out and she is unable to walk.   Pt is undernourished according to consultants. She lost a great deal of weight following her gastrointestinal surgeries and has been unable to eat very much until more recently.  Unfortunately she still has not gained much weight.. She is concerned about taking the calorie booster again due to causing her to experience abdominal complications(pain and bloating, however she notes that she started taking a new protein supplements that she thinks are better for her abdominal symptoms. Pt likes eating peanut butter for snacking.  Wt Readings from Last 3 Encounters:  12/21/14 88 lb 9.6 oz (40.189 kg)  12/17/14 83 lb 9.6 oz (37.921 kg)  12/10/14 83 lb 1.6 oz (37.694 kg)   Pt reports that she seen vascular surgeons every Friday for her swelling on her abdomen, and she notes that has not been finding much relief with the treatments.   Pt is interested in getting a flu shot.     Prior to Admission medications   Medication Sig Start Date End Date Taking? Authorizing Provider  amoxicillin-clavulanate (AUGMENTIN) 875-125 MG per tablet Take 1 tablet by mouth every 12 (twelve) hours. Patient not taking: Reported on 12/10/2014 11/26/14   Conrad Ferry, MD  cephALEXin (KEFLEX) 500 MG capsule Take 1 capsule (500 mg total) by mouth 2 (two) times daily. 11/15/14   Samantha J Rhyne, PA-C  clidinium-chlordiazePOXIDE (LIBRAX) 5-2.5 MG per capsule  09/15/14   Historical Provider, MD  dicyclomine (BENTYL) 20 MG tablet TAKE 1 TABLET (20 MG TOTAL) BY MOUTH 4 (FOUR) TIMES DAILY - BEFORE MEALS AND AT BEDTIME. 11/15/14   [name] Koyanagi, MD  feeding supplement, RESOURCE BREEZE, (RESOURCE BREEZE) LIQD Take 1 Container by mouth 3 (three) times daily between meals. 06/05/14   Modena Jansky, MD  fentaNYL (DURAGESIC - DOSED MCG/HR) 25 MCG/HR patch  09/09/14   Historical Provider, MD  fluocinonide cream (LIDEX) 0.05 %  11/07/14   Historical Provider, MD  ketotifen (ZADITOR) 0.025 % ophthalmic solution Place 1 drop into both eyes 2 (two) times daily.    Historical Provider, MD  LYRICA 50 MG capsule  10/15/14   Historical Provider, MD  ondansetron (ZOFRAN) 4 MG tablet  11/23/14   Historical Provider, MD  oxyCODONE-acetaminophen (PERCOCET/ROXICET) 5-325 MG per tablet  11/12/14   Historical Provider, MD  PROAIR HFA 108 (90 BASE) MCG/ACT inhaler  09/13/14   Historical  Provider, MD  ranitidine (ZANTAC) 150 MG tablet TAKE 1 TABLET TWICE DAILY 11/08/14   [name] Koyanagi, MD  Simethicone (GAS-X EXTRA STRENGTH PO) Take 1 tablet by mouth 3 (three) times daily after meals.     Historical Provider, MD  Vitamin D, Ergocalciferol, (DRISDOL) 50000 UNITS CAPS capsule Take 1 capsule (50,000 Units total) by mouth every 7 (seven) days. Patient taking differently: Take 50,000 Units by mouth every 7 (seven) days. On Saturday 06/29/14   Robyn Haber, MD  zoster vaccine live, PF, (ZOSTAVAX) 16109 UNT/0.65ML injection  Inject 19,400 Units into the skin once. Administer at pharmacy 11/17/14   [name] Koyanagi, MD   Social History   Social History  . Marital Status: Single    Spouse Name: N/A  . Number of Children: 0  . Years of Education: N/A   Occupational History  .     Social History Main Topics  . Smoking status: Former Smoker -- 0.50 packs/day for 35 years    Types: Cigarettes    Start date: 01/04/2003    Quit date: 04/17/2005  . Smokeless tobacco: Never Used  . Alcohol Use: No  . Drug Use: No  . Sexual Activity: No   Other Topics Concern  . Not on file   Social History Narrative   Single. Education: [employer]. Exercise.    Review of Systems noncontrib    Objective:   Physical Exam  Constitutional: She is oriented to person, place, and time. She appears well-developed. No distress.  HENT:  Head: Normocephalic and atraumatic.  Eyes: EOM are normal. Pupils are equal, round, and reactive to light.  Neck: Neck supple.  Cardiovascular: Normal rate.   Pulmonary/Chest: Effort normal.  Neurological: She is alert and oriented to person, place, and time. No cranial nerve deficit.  Skin: Skin is warm and dry.  Psychiatric: She has a normal mood and affect. Her behavior is normal.  Nursing note and vitals reviewed.  BP 98/60 mmHg  Pulse 86  Temp(Src) 98.8 F (37.1 C) (Oral)  Resp 17  Ht 5' 3.5" (1.613 m)  Wt 88 lb 9.6 oz (40.189 kg)  BMI 15.45 kg/m2  SpO2 94%     Assessment & Plan:  I have completed the patient encounter in its entirety as documented by the scribe, with editing by me where necessary. Fed Ceci P. Laney Pastor, M.D.  Breast mass in female Protein calorie malnutrition Recent complications Fem art bypass  Discussed new dietary changes as maybe more palatable(HO given from [facility]) F/u vasc Friday Urged her to do breast bx 9/7 as scheduled

## 2014-12-23 ENCOUNTER — Encounter: Payer: Self-pay | Admitting: Vascular Surgery

## 2014-12-23 ENCOUNTER — Telehealth: Payer: Self-pay

## 2014-12-23 NOTE — Telephone Encounter (Signed)
Phone call from pt.  Reported "the right groin started leaking again this morning."  Reported the drainage is "brownish".  Also stated the raised area in left groin is "getting a little bigger."  Reported that there is some redness in the area of left groin; denied drainage, warmth, or pain.  Denied fever/ chills.  Has appt. tomorrow for surveillance ultrasounds, and office exam.  Stated "I am out of supplies to change the dressing in the groin areas."  Phone call to [facility]; spoke with "Cindy".  Advised that the pt. is out of dressing supplies; requested that a [facility] nurse replenish supplies for pt.  Jenny Reichmann, nurse with [facility], stated she will have a nurse drop off supplies to pt. today, and will order more.

## 2014-12-24 ENCOUNTER — Ambulatory Visit (INDEPENDENT_AMBULATORY_CARE_PROVIDER_SITE_OTHER)
Admission: RE | Admit: 2014-12-24 | Discharge: 2014-12-24 | Disposition: A | Discharge status: Home / Self Care | Payer: Medicare Other | Source: Ambulatory Visit | Attending: Vascular Surgery | Admitting: Vascular Surgery

## 2014-12-24 ENCOUNTER — Ambulatory Visit (HOSPITAL_COMMUNITY)
Admission: RE | Admit: 2014-12-24 | Discharge: 2014-12-24 | Disposition: A | Discharge status: Home / Self Care | Payer: Medicare Other | Source: Ambulatory Visit | Attending: Vascular Surgery | Admitting: Vascular Surgery

## 2014-12-24 ENCOUNTER — Ambulatory Visit (INDEPENDENT_AMBULATORY_CARE_PROVIDER_SITE_OTHER): Payer: Medicare Other | Admitting: Vascular Surgery

## 2014-12-24 ENCOUNTER — Encounter: Payer: Self-pay | Admitting: Vascular Surgery

## 2014-12-24 ENCOUNTER — Other Ambulatory Visit: Payer: Self-pay

## 2014-12-24 VITALS — BP 134/87 | HR 94 | Temp 98.9°F | Ht 63.5 in | Wt 87.1 lb

## 2014-12-24 DIAGNOSIS — Z48812 Encounter for surgical aftercare following surgery on the circulatory system: Secondary | ICD-10-CM | POA: Insufficient documentation

## 2014-12-24 DIAGNOSIS — I70219 Atherosclerosis of native arteries of extremities with intermittent claudication, unspecified extremity: Secondary | ICD-10-CM | POA: Diagnosis present

## 2014-12-24 DIAGNOSIS — I70208 Unspecified atherosclerosis of native arteries of extremities, other extremity: Secondary | ICD-10-CM | POA: Diagnosis not present

## 2014-12-24 NOTE — Progress Notes (Signed)
Established Intermittent Claudication  History of Present Illness  Cynthia Bowen is a 69 y.o. (Aug 27, 1945) female who presents with chief complaint: routine follow-up.  She denies any fever or chills or drainage from left groin.  The patient's legs symptoms have improved.  The patient's symptoms are: intermittent claudication.  Her sx are limited by her limited ambulation.  The patient's treatment regimen currently included: maximal medical management and walking plan.  Patient previous underwent. 1. 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 R iliofem EA w/ DPA, R to L fem-fem [facility] (Dr. Donnetta Hutching) on 09/05/14 for L CIA stent occlusion  Past Medical History  Diagnosis Date  . COPD (chronic obstructive pulmonary disease)   . Pneumonia 12-2011  . GERD (gastroesophageal reflux disease)   . Headache(784.0)   . Arthritis     osteoarthritis  . Allergy   . Depression   . Neuromuscular disorder   . Osteoporosis   . Bronchitis     CURRENTLY AS OF 06/30/12 - HAS COUGH AND FINISHED ANTIBIOTIC FOR BRONCHITIS  . Fibromyalgia   . Pain     ABDOMINAL PAIN AND NAUSEA  . Pain     SOMETIMES PAIN RIGHT EAR AND NECK--STATES CAUSED BY A "LUMP" ON BACK OF EAR--USES KENALOG CREAM TOPICALLY AS NEEDED.  Marland Kitchen Gastrocutaneous fistula   . Anemia   . Anxiety   . Blood transfusion without reported diagnosis   . Heart murmur     young  . Peripheral vascular disease     hx  ?leg  . History of kidney stones     Past Surgical History  Procedure Laterality Date  . Abdominal hysterectomy    . Esophagogastroduodenoscopy  04/18/2012    Procedure: ESOPHAGOGASTRODUODENOSCOPY (EGD);  Surgeon: Inda Castle, MD;  Location: [facility];  Service: Endoscopy;  Laterality: N/A;  . Eus  05/29/2012    Procedure: UPPER ENDOSCOPIC ULTRASOUND (EUS) LINEAR;  Surgeon: Milus Banister, MD;  Location: [facility];  Service: Endoscopy;  Laterality: N/A;  .  Appendectomy    . Spine surgery      CERVICAL SPINE SURGERY X 2 - INCLUDING FUSION; LUMBAR SURGERY FOR RUPTURED DISC  . Eye surgery      BILATERAL CATARACT EXTRACTIONS  . Laparoscopic abdominal exploration N/A 07/02/2012    Procedure: converted to laparotomy with gastric biopsy;  Surgeon: Imogene Burn. Georgette Dover, MD;  Location: [facility];  Service: General;  Laterality: N/A;  Laparoscopic Gastric Biospy attempted.   . Laparotomy N/A 07/07/2012    Procedure: EXPLORATORY LAPAROTOMY repair of gastric perforation with omental graham patch, drainage of abdominal abcess;  Surgeon: Imogene Burn. Georgette Dover, MD;  Location: [facility];  Service: General;  Laterality: N/A;  . Stomach surgery  07/07/2012    Omental patch of gastric perforation  . Laparotomy N/A 07/13/2012    Procedure: EXPLORATORY LAPAROTOMY repair gastric leak;  Surgeon: Edward Jolly, MD;  Location: [facility];  Service: General;  Laterality: N/A;  . Laparotomy N/A 11/11/2012    Procedure: EXPLORATORY LAPAROTOMY;  Surgeon: Imogene Burn. Georgette Dover, MD;  Location: [place];  Service: General;  Laterality: N/A;  . Bowel resection N/A 11/11/2012    Procedure: SMALL BOWEL RESECTION;  Surgeon: Imogene Burn. Georgette Dover, MD;  Location: [place];  Service: General;  Laterality: N/A;  . Minor application of wound vac N/A 11/11/2012    Procedure: APPLICATION OF WOUND VAC;  Surgeon: Imogene Burn. Georgette Dover, MD;  Location: [place];  Service: General;  Laterality: N/A;  . Jejunostomy N/A 11/11/2012    Procedure: PLACEMENT OF FEEDING JEJUNOSTOMY TUBE;  Surgeon: Imogene Burn. Georgette Dover, MD;  Location: [place];  Service: General;  Laterality: N/A;  . Lysis of adhesion N/A 11/11/2012    Procedure: LYSIS OF ADHESION;  Surgeon: Imogene Burn. Georgette Dover, MD;  Location: [place];  Service: General;  Laterality: N/A;  . Hernia repair    . Gastric fistula repair      05/16/2013  . Colonoscopy    . Polypectomy    . Upper gastrointestinal endoscopy    . Abdominal aortagram N/A 08/19/2014    Procedure: ABDOMINAL Maxcine Ham;  Surgeon:  Conrad Hauppauge, MD;  Location: [facility] C[facility];  Service: Cardiovascular;  Laterality: N/A;  . Abdominal aortic endovascular stent graft N/A 08/26/2014    Procedure: AORTIC ENDOVASCULAR  COVERED STENT ;  Surgeon: Conrad , MD;  Location: [place];  Service: Vascular;  Laterality: N/A;  IVUS  . Femoral-femoral bypass graft Bilateral 09/04/2014    Procedure: Right External Iliac and right comon femoral endarterectomy with patching and right to left BYPASS GRAFT FEMORAL-FEMORAL ARTERY, ;  Surgeon: Rosetta Posner, MD;  Location: [facility] OR;  Service: Vascular;  Laterality: Bilateral;    Social History   Social History  . Marital Status: Single    Spouse Name: N/A  . Number of Children: 0  . Years of Education: N/A   Occupational History  .     Social History Main Topics  . Smoking status: Former Smoker -- 0.50 packs/day for 35 years    Types: Cigarettes    Start date: 01/04/2003    Quit date: 04/17/2005  . Smokeless tobacco: Never Used  . Alcohol Use: No  . Drug Use: No  . Sexual Activity: No   Other Topics Concern  . Not on file   Social History Narrative   Single. Education: [employer]. Exercise.    Family History  Problem Relation Age of Onset  . COPD Mother   . Hyperlipidemia Mother   . Hypertension Maternal Grandmother   . Stroke Maternal Grandmother   . Colon cancer Neg Hx     Current Outpatient Prescriptions  Medication Sig Dispense Refill  . cephALEXin (KEFLEX) 500 MG capsule Take 1 capsule (500 mg total) by mouth 2 (two) times daily. 30 capsule 0  . clidinium-chlordiazePOXIDE (LIBRAX) 5-2.5 MG per capsule     . dicyclomine (BENTYL) 20 MG tablet TAKE 1 TABLET (20 MG TOTAL) BY MOUTH 4 (FOUR) TIMES DAILY - BEFORE MEALS AND AT BEDTIME. 60 tablet 3  . feeding supplement, RESOURCE BREEZE, (RESOURCE BREEZE) LIQD Take 1 Container by mouth 3 (three) times daily between meals.    . fentaNYL (DURAGESIC - DOSED MCG/HR) 25 MCG/HR patch     . fluocinonide cream (LIDEX) 0.05 %     . ketotifen  (ZADITOR) 0.025 % ophthalmic solution Place 1 drop into both eyes 2 (two) times daily.    Marland Kitchen LYRICA 50 MG capsule     . ondansetron (ZOFRAN) 4 MG tablet     . oxyCODONE-acetaminophen (PERCOCET/ROXICET) 5-325 MG per tablet     . PROAIR HFA 108 (90 BASE) MCG/ACT inhaler     . ranitidine (ZANTAC) 150 MG tablet TAKE 1 TABLET TWICE DAILY 180 tablet 0  . Simethicone (GAS-X EXTRA STRENGTH PO) Take 1 tablet by mouth 3 (three) times daily after meals.     . Vitamin D, Ergocalciferol, (DRISDOL) 50000 UNITS CAPS capsule Take 1 capsule (50,000 Units  total) by mouth every 7 (seven) days. (Patient taking differently: Take 50,000 Units by mouth every 7 (seven) days. On Saturday) 12 capsule 3  . zoster vaccine live, PF, (ZOSTAVAX) 83151 UNT/0.65ML injection Inject 19,400 Units into the skin once. Administer at pharmacy 1 each 0  . amoxicillin-clavulanate (AUGMENTIN) 875-125 MG per tablet Take 1 tablet by mouth every 12 (twelve) hours. (Patient not taking: Reported on 12/10/2014) 20 tablet 0   No current facility-administered medications for this visit.     Allergies  Allergen Reactions  . Avelox [Moxifloxacin Hcl In Nacl] Nausea And Vomiting  . Betadine [Povidone Iodine] Itching and Rash  . Alendronate Sodium Nausea And Vomiting and Other (See Comments)    dizziness  . Aspirin Nausea Only  . Codeine Nausea And Vomiting  . Doxycycline Nausea And Vomiting  . Fluconazole Nausea And Vomiting  . Hydrocodone Nausea And Vomiting    GI distress  . Hydrocodone-Acetaminophen Nausea And Vomiting  . Morphine And Related Nausea Only  . Neurontin [Gabapentin] Other (See Comments)    Mood changes   . Nsaids Other (See Comments)    Severe gastritis & perforation - avoid NSAIDs when possible  . Quinolones Hives and Itching  . Sertraline Hcl Nausea And Vomiting and Other (See Comments)    Hallucinations   . Sulfamethoxazole Hives and Itching  . Latex Rash  . Sulfa Antibiotics Rash     REVIEW OF SYSTEMS:   (Positives checked otherwise negative)  CARDIOVASCULAR:   [ ]  chest pain,  [ ]  chest pressure,  [ ]  palpitations,  [ ]  shortness of breath when laying flat,  [ ]  shortness of breath with exertion,   [x]  pain in feet when walking,  [ ]  pain in feet when laying flat, [ ]  history of blood clot in veins (DVT),  [ ]  history of phlebitis,  [ ]  swelling in legs,  [ ]  varicose veins  PULMONARY:   [ ]  productive cough,  [ ]  asthma,  [ ]  wheezing  NEUROLOGIC:   [ ]  weakness in arms or legs,  [ ]  numbness in arms or legs,  [ ]  difficulty speaking or slurred speech,  [ ]  temporary loss of vision in one eye,  [ ]  dizziness  HEMATOLOGIC:   [ ]  bleeding problems,  [ ]  problems with blood clotting too easily  MUSCULOSKEL:   [ ]  joint pain, [ ]  joint swelling  GASTROINTEST:   [ ]  vomiting blood,  [ ]  blood in stool     GENITOURINARY:   [ ]  burning with urination,  [ ]  blood in urine  PSYCHIATRIC:   [ ]  history of major depression  INTEGUMENTARY:   [ ]  rashes,  [ ]  ulcers  CONSTITUTIONAL:   [ ]  fever,  [ ]  chills     Physical Examination  Filed Vitals:   12/24/14 1152  BP: 134/87  Pulse: 94  Temp: 98.9 F (37.2 C)  TempSrc: Oral  Height: 5' 3.5" (1.613 m)  Weight: 87 lb 1.6 oz (39.508 kg)  SpO2: 99%   Body mass index is 15.19 kg/(m^2).  General: A&O x 3, WD, Cachectic,   Pulmonary: Sym exp, good air movt, CTAB, no rales, rhonchi, & wheezing  Cardiac: RRR, Nl S1, S2, no Murmurs, rubs or gallops  Vascular: Vessel Right Left  Radial Palpable Palpable  Brachial Palpable Palpable  Carotid Palpable, without bruit Palpable, without bruit  Aorta Not palpable N/A  Femoral Palpable Palpable  Popliteal Not palpable Not palpable  PT Not Palpable Not Palpable  DP Not Palpable Not Palpable   Gastrointestinal: soft, NTND, no G/R, no HSM, no masses, no CVAT B, R groin fluid collection resolved,   Musculoskeletal: M/S 5/5 throughout , Extremities without  ischemic changes. Fem-fem graft: L side enlarging fluid collection with thinning skin  Neurologic: Pain and light touch intact in extremities Motor exam as listed above   Non-Invasive Vascular Imaging ABI (Date: 12/24/2014)  R: 0.62, DP: mono, PT: mono, TBI: 0.36  L: 0.56, DP: mono, PT: mono, TBI: 0.38  Fem-fem Duplex (Date: 12/24/2014)  Patent fem-fem  R side of graft: 0.88 cm x 1.2 cm fluid collection  L side of graft: 3.3 cm x 1.5 cm fluid collection   Medical Decision Making  Cynthia Bowen is a 69 y.o. female who presents with:  bilateral leg intermittent claudication without evidence of critical limb ischemia, s/p aortic stenting, R to L fem-fem bypass   Based on the patient's vascular studies and examination, I have offered the patient: L fem-fem graft exploration, possible removal of fem-fem graft if infected.  I suspect the fem-fem graft will not need to be immediately removed.  If patient requires fem-fem removal, reconstruction via a L ax-fem bypass vs fem-fem bypass with deep vein may need to be considered.  I discussed in depth with the patient the nature of atherosclerosis, and emphasized the importance of maximal medical management including strict control of blood pressure, blood glucose, and lipid levels, antiplatelet agents, obtaining regular exercise, and cessation of smoking.    The patient is aware that without maximal medical management the underlying atherosclerotic disease process will progress, limiting the benefit of any interventions. The patient is currently not on a statin: as not medically indicated. The patient is currently on an anti-platelet: Plavix.  Thank you for allowing [place] to participate in this patient's care.   Adele Barthel, MD [facility] Office: [phone] Pager: [phone]  12/24/2014, 12:55 PM

## 2014-12-25 ENCOUNTER — Other Ambulatory Visit: Payer: Self-pay | Admitting: Internal Medicine

## 2014-12-27 ENCOUNTER — Telehealth: Payer: Self-pay | Admitting: *Deleted

## 2014-12-27 NOTE — Telephone Encounter (Signed)
Patient states constipated for two days would like Miralax sent in?    States she needs 120 pills for Bentyly she takes 4 times a day and is only getting 60 pills can we do this?

## 2014-12-28 MED ORDER — DICYCLOMINE HCL 20 MG PO TABS: ORAL_TABLET | ORAL | Status: DC

## 2014-12-28 MED ORDER — POLYETHYLENE GLYCOL 3350 17 GM/SCOOP PO POWD: 17.0000 g | Freq: Two times a day (BID) | ORAL | Status: DC | PRN

## 2014-12-28 NOTE — Telephone Encounter (Signed)
Meds ordered this encounter  Medications  . polyethylene glycol powder (GLYCOLAX/MIRALAX) powder    Sig: Take 17 g by mouth 2 (two) times daily as needed.    Dispense:  500 g    Refill:  1  . dicyclomine (BENTYL) 20 MG tablet    Sig: TAKE 1 TABLET (20 MG TOTAL) BY MOUTH 4 (FOUR) TIMES DAILY - BEFORE MEALS AND AT BEDTIME.    Dispense:  120 tablet    Refill:  5

## 2014-12-29 ENCOUNTER — Telehealth: Payer: Self-pay

## 2014-12-29 ENCOUNTER — Inpatient Hospital Stay (HOSPITAL_COMMUNITY)
Admission: EM | Admit: 2014-12-29 | Discharge: 2015-01-12 | DRG: 252 | Disposition: A | Discharge status: Other Acute Inpatient Hospital | Payer: Medicare Other | Attending: Vascular Surgery | Admitting: Vascular Surgery

## 2014-12-29 ENCOUNTER — Encounter (HOSPITAL_COMMUNITY): Payer: Self-pay | Admitting: Family Medicine

## 2014-12-29 DIAGNOSIS — Z452 Encounter for adjustment and management of vascular access device: Secondary | ICD-10-CM

## 2014-12-29 DIAGNOSIS — Z825 Family history of asthma and other chronic lower respiratory diseases: Secondary | ICD-10-CM

## 2014-12-29 DIAGNOSIS — Z91040 Latex allergy status: Secondary | ICD-10-CM

## 2014-12-29 DIAGNOSIS — Y838 Other surgical procedures as the cause of abnormal reaction of the patient, or of later complication, without mention of misadventure at the time of the procedure: Secondary | ICD-10-CM | POA: Diagnosis not present

## 2014-12-29 DIAGNOSIS — Z886 Allergy status to analgesic agent status: Secondary | ICD-10-CM

## 2014-12-29 DIAGNOSIS — M810 Age-related osteoporosis without current pathological fracture: Secondary | ICD-10-CM | POA: Diagnosis present

## 2014-12-29 DIAGNOSIS — Z79899 Other long term (current) drug therapy: Secondary | ICD-10-CM

## 2014-12-29 DIAGNOSIS — T827XXA Infection and inflammatory reaction due to other cardiac and vascular devices, implants and grafts, initial encounter: Secondary | ICD-10-CM

## 2014-12-29 DIAGNOSIS — Z79891 Long term (current) use of opiate analgesic: Secondary | ICD-10-CM

## 2014-12-29 DIAGNOSIS — R1031 Right lower quadrant pain: Secondary | ICD-10-CM

## 2014-12-29 DIAGNOSIS — E43 Unspecified severe protein-calorie malnutrition: Secondary | ICD-10-CM | POA: Diagnosis present

## 2014-12-29 DIAGNOSIS — L02214 Cutaneous abscess of groin: Secondary | ICD-10-CM | POA: Diagnosis not present

## 2014-12-29 DIAGNOSIS — I739 Peripheral vascular disease, unspecified: Secondary | ICD-10-CM

## 2014-12-29 DIAGNOSIS — Z885 Allergy status to narcotic agent status: Secondary | ICD-10-CM

## 2014-12-29 DIAGNOSIS — L7621 Postprocedural hemorrhage and hematoma of skin and subcutaneous tissue following a dermatologic procedure: Secondary | ICD-10-CM | POA: Diagnosis not present

## 2014-12-29 DIAGNOSIS — Z681 Body mass index (BMI) 19 or less, adult: Secondary | ICD-10-CM

## 2014-12-29 DIAGNOSIS — K219 Gastro-esophageal reflux disease without esophagitis: Secondary | ICD-10-CM | POA: Diagnosis present

## 2014-12-29 DIAGNOSIS — J449 Chronic obstructive pulmonary disease, unspecified: Secondary | ICD-10-CM | POA: Diagnosis present

## 2014-12-29 DIAGNOSIS — F419 Anxiety disorder, unspecified: Secondary | ICD-10-CM | POA: Diagnosis present

## 2014-12-29 DIAGNOSIS — Z888 Allergy status to other drugs, medicaments and biological substances status: Secondary | ICD-10-CM

## 2014-12-29 DIAGNOSIS — F329 Major depressive disorder, single episode, unspecified: Secondary | ICD-10-CM | POA: Diagnosis present

## 2014-12-29 DIAGNOSIS — Y832 Surgical operation with anastomosis, bypass or graft as the cause of abnormal reaction of the patient, or of later complication, without mention of misadventure at the time of the procedure: Secondary | ICD-10-CM | POA: Diagnosis present

## 2014-12-29 DIAGNOSIS — R1030 Lower abdominal pain, unspecified: Secondary | ICD-10-CM | POA: Diagnosis present

## 2014-12-29 DIAGNOSIS — R64 Cachexia: Secondary | ICD-10-CM | POA: Diagnosis present

## 2014-12-29 DIAGNOSIS — Z419 Encounter for procedure for purposes other than remedying health state, unspecified: Secondary | ICD-10-CM

## 2014-12-29 DIAGNOSIS — Z881 Allergy status to other antibiotic agents status: Secondary | ICD-10-CM

## 2014-12-29 DIAGNOSIS — Z87891 Personal history of nicotine dependence: Secondary | ICD-10-CM

## 2014-12-29 DIAGNOSIS — M797 Fibromyalgia: Secondary | ICD-10-CM | POA: Diagnosis present

## 2014-12-29 LAB — COMPREHENSIVE METABOLIC PANEL
ALBUMIN: 3.1 g/dL — AB (ref 3.5–5.0)
ALT: 15 U/L (ref 14–54)
AST: 22 U/L (ref 15–41)
Alkaline Phosphatase: 90 U/L (ref 38–126)
Anion gap: 7 (ref 5–15)
BILIRUBIN TOTAL: 0.5 mg/dL (ref 0.3–1.2)
BUN: 8 mg/dL (ref 6–20)
CHLORIDE: 106 mmol/L (ref 101–111)
CO2: 27 mmol/L (ref 22–32)
CREATININE: 0.71 mg/dL (ref 0.44–1.00)
Calcium: 9 mg/dL (ref 8.9–10.3)
GFR calc Af Amer: >60 mL/min (ref >60)
GFR calc non Af Amer: >60 mL/min (ref >60)
GLUCOSE: 85 mg/dL (ref 65–99)
POTASSIUM: 4 mmol/L (ref 3.5–5.1)
Sodium: 140 mmol/L (ref 135–145)
Total Protein: 6.8 g/dL (ref 6.5–8.1)

## 2014-12-29 LAB — CBC
HEMATOCRIT: 32.8 % — AB (ref 36.0–46.0)
Hemoglobin: 10.7 g/dL — ABNORMAL LOW (ref 12.0–15.0)
MCH: 27.2 pg (ref 26.0–34.0)
MCHC: 32.6 g/dL (ref 30.0–36.0)
MCV: 83.5 fL (ref 78.0–100.0)
PLATELETS: 266 10*3/uL (ref 150–400)
RBC: 3.93 MIL/uL (ref 3.87–5.11)
RDW: 16.5 % — AB (ref 11.5–15.5)
WBC: 5.3 10*3/uL (ref 4.0–10.5)

## 2014-12-29 LAB — I-STAT BETA HCG BLOOD, ED (MC, WL, AP ONLY): I-stat hCG, quantitative: <5 m[IU]/mL (ref <5)

## 2014-12-29 MED ORDER — CEFAZOLIN SODIUM 1-5 GM-% IV SOLN
1.0000 g | Freq: Once | INTRAVENOUS | Status: AC
  Administered 2014-12-30: 1 g via INTRAVENOUS
  Filled 2014-12-29: qty 50

## 2014-12-29 MED ORDER — OXYCODONE-ACETAMINOPHEN 5-325 MG PO TABS
ORAL_TABLET | ORAL | Status: AC
  Filled 2014-12-29: qty 1

## 2014-12-29 MED ORDER — HYDROMORPHONE HCL 1 MG/ML IJ SOLN
0.5000 mg | INTRAMUSCULAR | Status: DC | PRN
  Administered 2014-12-30 – 2015-01-01 (×6): 0.5 mg via INTRAVENOUS
  Filled 2014-12-29 (×6): qty 1

## 2014-12-29 MED ORDER — ONDANSETRON HCL 4 MG/2ML IJ SOLN
4.0000 mg | Freq: Four times a day (QID) | INTRAMUSCULAR | Status: DC | PRN
  Administered 2014-12-30 (×2): 4 mg via INTRAVENOUS
  Filled 2014-12-29 (×2): qty 2

## 2014-12-29 MED ORDER — OXYCODONE-ACETAMINOPHEN 5-325 MG PO TABS
1.0000 | ORAL_TABLET | Freq: Once | ORAL | Status: AC
  Administered 2014-12-29: 1 via ORAL

## 2014-12-29 NOTE — ED Notes (Signed)
Pt c/o right sided groin pain.  States she is to have surgery here in the morning for an infected stent.

## 2014-12-29 NOTE — Telephone Encounter (Signed)
Rec'd call back from Dr. Bridgett Larsson at approx. 2:30 PM.  Advised that the pt. should go to the ER, due to symptoms of infection.  Called pt. and advised to go to the ER.  Advised not to eat or drink anything at this time.  Verb. Understanding.

## 2014-12-29 NOTE — Progress Notes (Signed)
When I called patient for pre-op call, she stated that "this thing has burst open today and I'm waiting to hear from the doctor's office". She states she called Dr. Lianne Moris office and left a message. I told her I would call her back later if she is not admitted today. I called Dr. Lianne Moris office and spoke with Colletta Maryland and she states that one of the other nurses would be calling pt shortly and triaging her.

## 2014-12-29 NOTE — ED Notes (Signed)
Pt here for bilateral groin pain. sts infection in right groin. sts she had stents placed bilaterally. Sent here by her doctor she sts she was supposed to have surgery tomorrow.

## 2014-12-29 NOTE — Progress Notes (Signed)
Called pt to see if she was going to the ED and she states she's sitting in [facility] right now, has not been registered yet. I told her if she does not get admitted this evening and she goes home, not to eat or drink anything after Midnight tonight and to arrive here at 10:30 AM for surgery tomorrow. She voiced understanding. She stated that the nurse from [facility] felt that she would be admitted because it sounds like she has an infection in her femoral area.

## 2014-12-29 NOTE — ED Provider Notes (Signed)
CSN: [id number]     Arrival date & time 12/29/14  1811 History   First MD Initiated Contact with Patient 12/29/14 2217     Chief Complaint  Patient presents with  . Groin Pain     Patient is a 69 y.o. female presenting with groin pain. The history is provided by the patient. No language interpreter was used.  Groin Pain   Cynthia Bowen presents for evaluation of groin pain. She is scheduled for vascular surgery in the morning but has increased pain in her groin and presents to the emergency department today for further evaluation. She reports ongoing pain in her groin since having surgery back in May. She had swelling in the left groin with a fluid collection in that area. 2 days ago the fluid collection popped and she drained purulent material at home. The swelling and pain have decreased significantly since that time. Over the last week she's had increased pain, swelling, tenderness in the right groin that is similar to how the left groin began. She has intermittent drainage in the right groin but none currently. She denies any fevers, chest pain, shortness of breath. She is not currently on any antibiotics.  Past Medical History  Diagnosis Date  . COPD (chronic obstructive pulmonary disease)   . Pneumonia 12-2011  . GERD (gastroesophageal reflux disease)   . Headache(784.0)   . Arthritis     osteoarthritis  . Allergy   . Depression   . Neuromuscular disorder   . Osteoporosis   . Bronchitis     CURRENTLY AS OF 06/30/12 - HAS COUGH AND FINISHED ANTIBIOTIC FOR BRONCHITIS  . Fibromyalgia   . Pain     ABDOMINAL PAIN AND NAUSEA  . Pain     SOMETIMES PAIN RIGHT EAR AND NECK--STATES CAUSED BY A "LUMP" ON BACK OF EAR--USES KENALOG CREAM TOPICALLY AS NEEDED.  Marland Kitchen Gastrocutaneous fistula   . Anemia   . Anxiety   . Blood transfusion without reported diagnosis   . Heart murmur     young  . Peripheral vascular disease     hx  ?leg  . History of kidney stones    Past Surgical History   Procedure Laterality Date  . Abdominal hysterectomy    . Esophagogastroduodenoscopy  04/18/2012    Procedure: ESOPHAGOGASTRODUODENOSCOPY (EGD);  Surgeon: Inda Castle, MD;  Location: [facility];  Service: Endoscopy;  Laterality: N/A;  . Eus  05/29/2012    Procedure: UPPER ENDOSCOPIC ULTRASOUND (EUS) LINEAR;  Surgeon: Milus Banister, MD;  Location: [facility];  Service: Endoscopy;  Laterality: N/A;  . Appendectomy    . Spine surgery      CERVICAL SPINE SURGERY X 2 - INCLUDING FUSION; LUMBAR SURGERY FOR RUPTURED DISC  . Eye surgery      BILATERAL CATARACT EXTRACTIONS  . Laparoscopic abdominal exploration N/A 07/02/2012    Procedure: converted to laparotomy with gastric biopsy;  Surgeon: Imogene Burn. Georgette Dover, MD;  Location: [facility];  Service: General;  Laterality: N/A;  Laparoscopic Gastric Biospy attempted.   . Laparotomy N/A 07/07/2012    Procedure: EXPLORATORY LAPAROTOMY repair of gastric perforation with omental graham patch, drainage of abdominal abcess;  Surgeon: Imogene Burn. Georgette Dover, MD;  Location: [facility];  Service: General;  Laterality: N/A;  . Stomach surgery  07/07/2012    Omental patch of gastric perforation  . Laparotomy N/A 07/13/2012    Procedure: EXPLORATORY LAPAROTOMY repair gastric leak;  Surgeon: Edward Jolly, MD;  Location: [facility];  Service: General;  Laterality: N/A;  . Laparotomy N/A 11/11/2012    Procedure: EXPLORATORY LAPAROTOMY;  Surgeon: Imogene Burn. Georgette Dover, MD;  Location: [place];  Service: General;  Laterality: N/A;  . Bowel resection N/A 11/11/2012    Procedure: SMALL BOWEL RESECTION;  Surgeon: Imogene Burn. Georgette Dover, MD;  Location: [place];  Service: General;  Laterality: N/A;  . Minor application of wound vac N/A 11/11/2012    Procedure: APPLICATION OF WOUND VAC;  Surgeon: Imogene Burn. Georgette Dover, MD;  Location: [place];  Service: General;  Laterality: N/A;  . Jejunostomy N/A 11/11/2012    Procedure: PLACEMENT OF FEEDING JEJUNOSTOMY TUBE;  Surgeon: Imogene Burn. Georgette Dover, MD;  Location: [place];  Service: General;  Laterality: N/A;  . Lysis of adhesion N/A 11/11/2012    Procedure: LYSIS OF ADHESION;  Surgeon: Imogene Burn. Georgette Dover, MD;  Location: [place];  Service: General;  Laterality: N/A;  . Hernia repair    . Gastric fistula repair      05/16/2013  . Colonoscopy    . Polypectomy    . Upper gastrointestinal endoscopy    . Abdominal aortagram N/A 08/19/2014    Procedure: ABDOMINAL Maxcine Ham;  Surgeon: Conrad Redfield, MD;  Location: [facility] CATH [facility];  Service: Cardiovascular;  Laterality: N/A;  . Abdominal aortic endovascular stent graft N/A 08/26/2014    Procedure: AORTIC ENDOVASCULAR  COVERED STENT ;  Surgeon: Conrad [place], MD;  Location: [place];  Service: Vascular;  Laterality: N/A;  IVUS  . Femoral-femoral bypass graft Bilateral 09/04/2014    Procedure: Right External Iliac and right comon femoral endarterectomy with patching and right to left BYPASS GRAFT FEMORAL-FEMORAL ARTERY, ;  Surgeon: Rosetta Posner, MD;  Location[facility];  Service: Vascular;  Laterality: Bilateral;   Family History  Problem Relation Age of Onset  . COPD Mother   . Hyperlipidemia Mother   . Hypertension Maternal Grandmother   . Stroke Maternal Grandmother   . Colon cancer Neg Hx    Social History  Substance Use Topics  . Smoking status: Former Smoker -- 0.50 packs/day for 35 years    Types: Cigarettes    Start date: 01/04/2003    Quit date: 04/17/2005  . Smokeless tobacco: Never Used  . Alcohol Use: No   OB History    No data available     Review of Systems  All other systems reviewed and are negative.     Allergies  Avelox; Betadine; Alendronate sodium; Aspirin; Codeine; Doxycycline; Fluconazole; Hydrocodone; Hydrocodone-acetaminophen; Morphine and related; Neurontin; Nsaids; Quinolones; Sertraline hcl; Sulfamethoxazole; Latex; and Sulfa antibiotics  Home Medications   Prior to Admission medications   Medication Sig Start Date End Date Taking? Authorizing Provider  amoxicillin-clavulanate  (AUGMENTIN) 875-125 MG per tablet Take 1 tablet by mouth every 12 (twelve) hours. Patient not taking: Reported on 12/10/2014 11/26/14   Conrad , MD  cephALEXin (KEFLEX) 500 MG capsule Take 1 capsule (500 mg total) by mouth 2 (two) times daily. 11/15/14   Samantha J Rhyne, PA-C  clidinium-chlordiazePOXIDE (LIBRAX) 5-2.5 MG per capsule  09/15/14   Historical Provider, MD  dicyclomine (BENTYL) 20 MG tablet TAKE 1 TABLET (20 MG TOTAL) BY MOUTH 4 (FOUR) TIMES DAILY - BEFORE MEALS AND AT BEDTIME. 12/28/14   [name] Koyanagi, MD  feeding supplement, RESOURCE BREEZE, (RESOURCE BREEZE) LIQD Take 1 Container by mouth 3 (three) times daily between meals. 06/05/14   Modena Jansky, MD  fentaNYL (DURAGESIC - DOSED MCG/HR) 25 MCG/HR patch  09/09/14   Historical Provider,  MD  fluocinonide cream (LIDEX) 0.05 %  11/07/14   Historical Provider, MD  ketotifen (ZADITOR) 0.025 % ophthalmic solution Place 1 drop into both eyes 2 (two) times daily.    Historical Provider, MD  LYRICA 50 MG capsule  10/15/14   Historical Provider, MD  ondansetron (ZOFRAN) 4 MG tablet  11/23/14   Historical Provider, MD  oxyCODONE-acetaminophen (PERCOCET/ROXICET) 5-325 MG per tablet  11/12/14   Historical Provider, MD  polyethylene glycol powder (GLYCOLAX/MIRALAX) powder Take 17 g by mouth 2 (two) times daily as needed. 12/28/14   [name] Koyanagi, MD  PROAIR HFA [id number] BASE) MCG/ACT inhaler  09/13/14   Historical Provider, MD  ranitidine (ZANTAC) 150 MG tablet TAKE 1 TABLET TWICE DAILY 11/08/14   [name] Koyanagi, MD  Simethicone (GAS-X EXTRA STRENGTH PO) Take 1 tablet by mouth 3 (three) times daily after meals.     Historical Provider, MD  Vitamin D, Ergocalciferol, (DRISDOL) 50000 UNITS CAPS capsule Take 1 capsule (50,000 Units total) by mouth every 7 (seven) days. Patient taking differently: Take 50,000 Units by mouth every 7 (seven) days. On Saturday 06/29/14   Robyn Haber, MD  zoster vaccine live, PF, (ZOSTAVAX) 10932 UNT/0.65ML  injection Inject 19,400 Units into the skin once. Administer at pharmacy 11/17/14   [name] Koyanagi, MD   BP 131/92 mmHg  Pulse 57  Temp(Src) 97.8 F (36.6 C) (Oral)  Resp 13  SpO2 100% Physical Exam  Constitutional: She is oriented to person, place, and time. She appears well-developed and well-nourished.  HENT:  Head: Normocephalic and atraumatic.  Cardiovascular: Normal rate and regular rhythm.   No murmur heard. Pulmonary/Chest: Effort normal and breath sounds normal. No respiratory distress.  Abdominal: Soft. There is no tenderness. There is no rebound and no guarding.  Musculoskeletal:  2+ femoral pulses bilaterally. There is tenderness along her suprapubic region at the location of her bypass graft. There is a punctate opening with purulent drainage over the left inguinal region. There is mild erythema, tenderness and swelling over the right inguinal region. No active drainage from the right inguinal region. Distal bilateral lower extremities are perfused.  Neurological: She is alert and oriented to person, place, and time.  Skin: Skin is warm and dry.  Psychiatric: She has a normal mood and affect. Her behavior is normal.  Nursing note and vitals reviewed.   ED Course  Procedures (including critical care time) Labs Review Labs Reviewed  COMPREHENSIVE METABOLIC PANEL - Abnormal; Notable for the following:    Albumin 3.1 (*)    All other components within normal limits  CBC - Abnormal; Notable for the following:    Hemoglobin 10.7 (*)    HCT 32.8 (*)    RDW 16.5 (*)    All other components within normal limits  CBC - Abnormal; Notable for the following:    RDW 16.7 (*)    All other components within normal limits  WOUND CULTURE  SURGICAL PCR SCREEN  PROTIME-INR  APTT  COMPREHENSIVE METABOLIC PANEL  URINALYSIS, ROUTINE W REFLEX MICROSCOPIC (NOT AT [facility])  I-STAT BETA HCG BLOOD, ED ([facility], [facility], [facility] ONLY)  TYPE AND SCREEN    Imaging Review No results found. I have  personally reviewed and evaluated these images and lab results as part of my medical decision-making.   EKG Interpretation None      MDM   Final diagnoses:  Groin pain, right    Patient with history of vascular disease here for swelling in her groin. She  is scheduled for surgery tomorrow with Dr. Bridgett Larsson. She is not currently toxic appearing but does have local pain in bilateral inguinal regions, right greater than left. Discussed with Dr. Donnetta Hutching with vascular surgery. He recommends admission for surgery in the morning with pain control and prophylactic antibiotics prior to surgery.     Quintella Reichert, MD 12/30/14 [phone]

## 2014-12-29 NOTE — Telephone Encounter (Signed)
Phone call from pt.  Reported that the left groin started draining a yellow-green drainage last night.  Also, stated there is increased swelling and pain in the right groin, and that the right groin no longer draining.   Denied any fever or chills.  Reported she is scheduled for surgery tomorrow morning.   Advised will make Dr. Bridgett Larsson aware.

## 2014-12-30 ENCOUNTER — Other Ambulatory Visit: Payer: Self-pay

## 2014-12-30 ENCOUNTER — Ambulatory Visit (HOSPITAL_COMMUNITY): Admission: RE | Admit: 2014-12-30 | Payer: Medicare Other | Source: Ambulatory Visit | Admitting: Vascular Surgery

## 2014-12-30 ENCOUNTER — Observation Stay (HOSPITAL_COMMUNITY): Payer: Medicare Other

## 2014-12-30 ENCOUNTER — Observation Stay (HOSPITAL_COMMUNITY): Payer: Medicare Other | Admitting: Anesthesiology

## 2014-12-30 ENCOUNTER — Encounter (HOSPITAL_COMMUNITY): Payer: Self-pay

## 2014-12-30 ENCOUNTER — Encounter (HOSPITAL_COMMUNITY)
Admission: EM | Disposition: A | Discharge status: Nursing Home (Includes ICF) | Payer: Self-pay | Source: Home / Self Care | Attending: Vascular Surgery

## 2014-12-30 DIAGNOSIS — Z825 Family history of asthma and other chronic lower respiratory diseases: Secondary | ICD-10-CM | POA: Diagnosis not present

## 2014-12-30 DIAGNOSIS — I9789 Other postprocedural complications and disorders of the circulatory system, not elsewhere classified: Secondary | ICD-10-CM | POA: Diagnosis not present

## 2014-12-30 DIAGNOSIS — T827XXA Infection and inflammatory reaction due to other cardiac and vascular devices, implants and grafts, initial encounter: Secondary | ICD-10-CM | POA: Diagnosis present

## 2014-12-30 DIAGNOSIS — K219 Gastro-esophageal reflux disease without esophagitis: Secondary | ICD-10-CM | POA: Diagnosis present

## 2014-12-30 DIAGNOSIS — Z888 Allergy status to other drugs, medicaments and biological substances status: Secondary | ICD-10-CM | POA: Diagnosis not present

## 2014-12-30 DIAGNOSIS — J449 Chronic obstructive pulmonary disease, unspecified: Secondary | ICD-10-CM

## 2014-12-30 DIAGNOSIS — Y838 Other surgical procedures as the cause of abnormal reaction of the patient, or of later complication, without mention of misadventure at the time of the procedure: Secondary | ICD-10-CM | POA: Diagnosis not present

## 2014-12-30 DIAGNOSIS — B9689 Other specified bacterial agents as the cause of diseases classified elsewhere: Secondary | ICD-10-CM

## 2014-12-30 DIAGNOSIS — Y839 Surgical procedure, unspecified as the cause of abnormal reaction of the patient, or of later complication, without mention of misadventure at the time of the procedure: Secondary | ICD-10-CM | POA: Diagnosis not present

## 2014-12-30 DIAGNOSIS — F419 Anxiety disorder, unspecified: Secondary | ICD-10-CM | POA: Diagnosis present

## 2014-12-30 DIAGNOSIS — M797 Fibromyalgia: Secondary | ICD-10-CM | POA: Diagnosis present

## 2014-12-30 DIAGNOSIS — I739 Peripheral vascular disease, unspecified: Secondary | ICD-10-CM | POA: Diagnosis present

## 2014-12-30 DIAGNOSIS — Y832 Surgical operation with anastomosis, bypass or graft as the cause of abnormal reaction of the patient, or of later complication, without mention of misadventure at the time of the procedure: Secondary | ICD-10-CM | POA: Diagnosis present

## 2014-12-30 DIAGNOSIS — Z87891 Personal history of nicotine dependence: Secondary | ICD-10-CM | POA: Diagnosis not present

## 2014-12-30 DIAGNOSIS — Z885 Allergy status to narcotic agent status: Secondary | ICD-10-CM | POA: Diagnosis not present

## 2014-12-30 DIAGNOSIS — Z886 Allergy status to analgesic agent status: Secondary | ICD-10-CM | POA: Diagnosis not present

## 2014-12-30 DIAGNOSIS — Z881 Allergy status to other antibiotic agents status: Secondary | ICD-10-CM | POA: Diagnosis not present

## 2014-12-30 DIAGNOSIS — M810 Age-related osteoporosis without current pathological fracture: Secondary | ICD-10-CM | POA: Diagnosis present

## 2014-12-30 DIAGNOSIS — E43 Unspecified severe protein-calorie malnutrition: Secondary | ICD-10-CM | POA: Diagnosis present

## 2014-12-30 DIAGNOSIS — Z91040 Latex allergy status: Secondary | ICD-10-CM | POA: Diagnosis not present

## 2014-12-30 DIAGNOSIS — L02214 Cutaneous abscess of groin: Secondary | ICD-10-CM | POA: Diagnosis present

## 2014-12-30 DIAGNOSIS — R64 Cachexia: Secondary | ICD-10-CM | POA: Diagnosis present

## 2014-12-30 DIAGNOSIS — Z681 Body mass index (BMI) 19 or less, adult: Secondary | ICD-10-CM | POA: Diagnosis not present

## 2014-12-30 DIAGNOSIS — F329 Major depressive disorder, single episode, unspecified: Secondary | ICD-10-CM | POA: Diagnosis present

## 2014-12-30 DIAGNOSIS — L7621 Postprocedural hemorrhage and hematoma of skin and subcutaneous tissue following a dermatologic procedure: Secondary | ICD-10-CM | POA: Diagnosis not present

## 2014-12-30 DIAGNOSIS — Z79891 Long term (current) use of opiate analgesic: Secondary | ICD-10-CM | POA: Diagnosis not present

## 2014-12-30 DIAGNOSIS — Z79899 Other long term (current) drug therapy: Secondary | ICD-10-CM | POA: Diagnosis not present

## 2014-12-30 DIAGNOSIS — M7989 Other specified soft tissue disorders: Secondary | ICD-10-CM | POA: Diagnosis not present

## 2014-12-30 DIAGNOSIS — T82898A Other specified complication of vascular prosthetic devices, implants and grafts, initial encounter: Secondary | ICD-10-CM

## 2014-12-30 HISTORY — PX: WOUND EXPLORATION: SHX6188

## 2014-12-30 HISTORY — PX: APPLICATION OF WOUND VAC: SHX5189

## 2014-12-30 HISTORY — PX: IRRIGATION AND DEBRIDEMENT ABSCESS: SHX5252

## 2014-12-30 LAB — CREATININE, SERUM
CREATININE: 0.58 mg/dL (ref 0.44–1.00)
GFR calc Af Amer: >60 mL/min (ref >60)

## 2014-12-30 LAB — SURGICAL PCR SCREEN
MRSA, PCR: NEGATIVE
Staphylococcus aureus: POSITIVE — AB

## 2014-12-30 LAB — CBC
HCT: 37.1 % (ref 36.0–46.0)
HEMATOCRIT: 38.5 % (ref 36.0–46.0)
HEMOGLOBIN: 12.2 g/dL (ref 12.0–15.0)
Hemoglobin: 13.2 g/dL (ref 12.0–15.0)
MCH: 28.4 pg (ref 26.0–34.0)
MCH: 27.3 pg (ref 26.0–34.0)
MCHC: 34.3 g/dL (ref 30.0–36.0)
MCHC: 32.9 g/dL (ref 30.0–36.0)
MCV: 83 fL (ref 78.0–100.0)
MCV: 83 fL (ref 78.0–100.0)
PLATELETS: 242 10*3/uL (ref 150–400)
Platelets: 279 10*3/uL (ref 150–400)
RBC: 4.64 MIL/uL (ref 3.87–5.11)
RBC: 4.47 MIL/uL (ref 3.87–5.11)
RDW: 16.7 % — AB (ref 11.5–15.5)
RDW: 16.5 % — ABNORMAL HIGH (ref 11.5–15.5)
WBC: 5.9 10*3/uL (ref 4.0–10.5)
WBC: 8.5 10*3/uL (ref 4.0–10.5)

## 2014-12-30 LAB — URINALYSIS, ROUTINE W REFLEX MICROSCOPIC
Bilirubin Urine: NEGATIVE
GLUCOSE, UA: NEGATIVE mg/dL
HGB URINE DIPSTICK: NEGATIVE
Ketones, ur: NEGATIVE mg/dL
Nitrite: NEGATIVE
PH: 5.5 (ref 5.0–8.0)
Protein, ur: NEGATIVE mg/dL
Specific Gravity, Urine: 1.01 (ref 1.005–1.030)
Urobilinogen, UA: 0.2 mg/dL (ref 0.0–1.0)

## 2014-12-30 LAB — URINE MICROSCOPIC-ADD ON

## 2014-12-30 LAB — PROTIME-INR
INR: 1.01 (ref 0.00–1.49)
PROTHROMBIN TIME: 13.5 s (ref 11.6–15.2)

## 2014-12-30 LAB — TYPE AND SCREEN
ABO/RH(D): B POS
Antibody Screen: NEGATIVE

## 2014-12-30 LAB — COMPREHENSIVE METABOLIC PANEL
ALBUMIN: 3.4 g/dL — AB (ref 3.5–5.0)
ALT: 18 U/L (ref 14–54)
AST: 27 U/L (ref 15–41)
Alkaline Phosphatase: 102 U/L (ref 38–126)
Anion gap: 9 (ref 5–15)
BILIRUBIN TOTAL: 0.4 mg/dL (ref 0.3–1.2)
BUN: 8 mg/dL (ref 6–20)
CHLORIDE: 106 mmol/L (ref 101–111)
CO2: 24 mmol/L (ref 22–32)
CREATININE: 0.64 mg/dL (ref 0.44–1.00)
Calcium: 9.2 mg/dL (ref 8.9–10.3)
GFR calc Af Amer: >60 mL/min (ref >60)
GLUCOSE: 71 mg/dL (ref 65–99)
POTASSIUM: 4.1 mmol/L (ref 3.5–5.1)
Sodium: 139 mmol/L (ref 135–145)
Total Protein: 7.2 g/dL (ref 6.5–8.1)

## 2014-12-30 LAB — GLUCOSE, CAPILLARY
GLUCOSE-CAPILLARY: 70 mg/dL (ref 65–99)
Glucose-Capillary: 100 mg/dL — ABNORMAL HIGH (ref 65–99)

## 2014-12-30 LAB — APTT: APTT: 30 s (ref 24–37)

## 2014-12-30 SURGERY — WOUND EXPLORATION
Anesthesia: General | Site: Leg Upper | Laterality: Right

## 2014-12-30 MED ORDER — FENTANYL CITRATE (PF) 100 MCG/2ML IJ SOLN
INTRAMUSCULAR | Status: DC | PRN
  Administered 2014-12-30 (×2): 25 ug via INTRAVENOUS

## 2014-12-30 MED ORDER — OXYCODONE-ACETAMINOPHEN 5-325 MG PO TABS
1.0000 | ORAL_TABLET | Freq: Four times a day (QID) | ORAL | Status: DC | PRN
  Administered 2014-12-30 – 2014-12-31 (×3): 1 via ORAL
  Filled 2014-12-30 (×3): qty 1

## 2014-12-30 MED ORDER — MIDAZOLAM HCL 5 MG/5ML IJ SOLN
INTRAMUSCULAR | Status: DC | PRN
  Administered 2014-12-30: 2 mg via INTRAVENOUS

## 2014-12-30 MED ORDER — PHENOL 1.4 % MT LIQD: 1.0000 | OROMUCOSAL | Status: DC | PRN

## 2014-12-30 MED ORDER — CEFAZOLIN SODIUM 1-5 GM-% IV SOLN
1.0000 g | Freq: Three times a day (TID) | INTRAVENOUS | Status: DC
  Filled 2014-12-30 (×2): qty 50

## 2014-12-30 MED ORDER — ENOXAPARIN SODIUM 30 MG/0.3ML ~~LOC~~ SOLN
30.0000 mg | SUBCUTANEOUS | Status: DC
  Filled 2014-12-30 (×2): qty 0.3

## 2014-12-30 MED ORDER — DICYCLOMINE HCL 20 MG PO TABS
20.0000 mg | ORAL_TABLET | Freq: Three times a day (TID) | ORAL | Status: DC
  Administered 2014-12-30 – 2015-01-12 (×43): 20 mg via ORAL
  Filled 2014-12-30 (×62): qty 1

## 2014-12-30 MED ORDER — VITAMIN D (ERGOCALCIFEROL) 1.25 MG (50000 UNIT) PO CAPS
50000.0000 [IU] | ORAL_CAPSULE | ORAL | Status: DC
  Administered 2015-01-06: 50000 [IU] via ORAL
  Filled 2014-12-30 (×2): qty 1

## 2014-12-30 MED ORDER — METOPROLOL TARTRATE 1 MG/ML IV SOLN
2.0000 mg | INTRAVENOUS | Status: DC | PRN
  Filled 2014-12-30: qty 5

## 2014-12-30 MED ORDER — POLYETHYLENE GLYCOL 3350 17 GM/SCOOP PO POWD
17.0000 g | Freq: Two times a day (BID) | ORAL | Status: DC | PRN
  Filled 2014-12-30: qty 255

## 2014-12-30 MED ORDER — ALBUTEROL SULFATE (2.5 MG/3ML) 0.083% IN NEBU: 2.5000 mg | INHALATION_SOLUTION | RESPIRATORY_TRACT | Status: DC | PRN

## 2014-12-30 MED ORDER — LACTATED RINGERS IV SOLN
INTRAVENOUS | Status: DC | PRN
  Administered 2014-12-30: 12:00:00 via INTRAVENOUS

## 2014-12-30 MED ORDER — PHENYLEPHRINE HCL 10 MG/ML IJ SOLN
INTRAMUSCULAR | Status: DC | PRN
  Administered 2014-12-30: 80 ug via INTRAVENOUS

## 2014-12-30 MED ORDER — DEXTROSE 5 % IV SOLN
2.0000 g | Freq: Two times a day (BID) | INTRAVENOUS | Status: DC
  Administered 2014-12-30 – 2015-01-12 (×24): 2 g via INTRAVENOUS
  Filled 2014-12-30 (×30): qty 2

## 2014-12-30 MED ORDER — BOOST / RESOURCE BREEZE PO LIQD
1.0000 | Freq: Three times a day (TID) | ORAL | Status: DC
  Administered 2014-12-30 – 2015-01-12 (×28): 1 via ORAL

## 2014-12-30 MED ORDER — PREGABALIN 25 MG PO CAPS
50.0000 mg | ORAL_CAPSULE | Freq: Every day | ORAL | Status: DC
  Administered 2014-12-30 – 2015-01-12 (×13): 50 mg via ORAL
  Filled 2014-12-30 (×9): qty 2
  Filled 2014-12-30: qty 1
  Filled 2014-12-30: qty 2
  Filled 2014-12-30: qty 1
  Filled 2014-12-30: qty 2
  Filled 2014-12-30: qty 1

## 2014-12-30 MED ORDER — SODIUM CHLORIDE 0.9 % IV SOLN: 500.0000 mL | Freq: Once | INTRAVENOUS | Status: DC | PRN

## 2014-12-30 MED ORDER — MUPIROCIN 2 % EX OINT: 1.0000 "application " | TOPICAL_OINTMENT | Freq: Once | CUTANEOUS | Status: DC

## 2014-12-30 MED ORDER — PANTOPRAZOLE SODIUM 40 MG PO TBEC
40.0000 mg | DELAYED_RELEASE_TABLET | Freq: Every day | ORAL | Status: DC
  Administered 2014-12-30 – 2015-01-12 (×12): 40 mg via ORAL
  Filled 2014-12-30 (×4): qty 1
  Filled 2014-12-30: qty 2
  Filled 2014-12-30 (×6): qty 1

## 2014-12-30 MED ORDER — LIDOCAINE HCL (CARDIAC) 20 MG/ML IV SOLN
INTRAVENOUS | Status: DC | PRN
  Administered 2014-12-30: 50 mg via INTRAVENOUS

## 2014-12-30 MED ORDER — DIPHENHYDRAMINE HCL 25 MG PO CAPS
25.0000 mg | ORAL_CAPSULE | Freq: Four times a day (QID) | ORAL | Status: AC
  Administered 2014-12-30 (×2): 25 mg via ORAL
  Filled 2014-12-30 (×4): qty 1

## 2014-12-30 MED ORDER — PROMETHAZINE HCL 25 MG/ML IJ SOLN: 6.2500 mg | INTRAMUSCULAR | Status: DC | PRN

## 2014-12-30 MED ORDER — CILIDINIUM-CHLORDIAZEPOXIDE 2.5-5 MG PO CAPS
1.0000 | ORAL_CAPSULE | Freq: Three times a day (TID) | ORAL | Status: DC
  Administered 2014-12-30 – 2015-01-12 (×43): 1 via ORAL
  Filled 2014-12-30 (×63): qty 1

## 2014-12-30 MED ORDER — SODIUM CHLORIDE 0.9 % IR SOLN
Status: DC | PRN
  Administered 2014-12-30: 500 mL

## 2014-12-30 MED ORDER — ONDANSETRON HCL 4 MG PO TABS
4.0000 mg | ORAL_TABLET | Freq: Three times a day (TID) | ORAL | Status: DC | PRN
  Administered 2014-12-30 – 2015-01-08 (×2): 4 mg via ORAL
  Filled 2014-12-30 (×2): qty 1

## 2014-12-30 MED ORDER — IODIXANOL 320 MG/ML IV SOLN
INTRAVENOUS | Status: DC | PRN
  Administered 2014-12-30: 30 mL via INTRAVENOUS

## 2014-12-30 MED ORDER — LACTATED RINGERS IV SOLN
INTRAVENOUS | Status: DC
  Administered 2014-12-30: 12:00:00 via INTRAVENOUS

## 2014-12-30 MED ORDER — SODIUM CHLORIDE 0.9 % IV SOLN
INTRAVENOUS | Status: DC
  Administered 2015-01-03: 17:00:00 via INTRAVENOUS

## 2014-12-30 MED ORDER — THROMBIN 20000 UNITS EX SOLR
CUTANEOUS | Status: AC
  Filled 2014-12-30: qty 20000

## 2014-12-30 MED ORDER — FENTANYL CITRATE (PF) 100 MCG/2ML IJ SOLN
INTRAMUSCULAR | Status: AC
  Administered 2014-12-30: 25 ug via INTRAVENOUS
  Filled 2014-12-30: qty 2

## 2014-12-30 MED ORDER — ACETAMINOPHEN 325 MG RE SUPP
325.0000 mg | RECTAL | Status: DC | PRN
  Filled 2014-12-30: qty 2

## 2014-12-30 MED ORDER — VANCOMYCIN HCL IN DEXTROSE 1-5 GM/200ML-% IV SOLN
1000.0000 mg | INTRAVENOUS | Status: DC
  Administered 2014-12-30 – 2015-01-04 (×5): 1000 mg via INTRAVENOUS
  Filled 2014-12-30 (×9): qty 200

## 2014-12-30 MED ORDER — METHYLPREDNISOLONE SODIUM SUCC 125 MG IJ SOLR
INTRAMUSCULAR | Status: DC | PRN
  Administered 2014-12-30: 125 mg via INTRAVENOUS

## 2014-12-30 MED ORDER — LABETALOL HCL 5 MG/ML IV SOLN
10.0000 mg | INTRAVENOUS | Status: DC | PRN
Start: 2014-12-30 — End: 2015-01-12
  Administered 2015-01-01 (×2): 10 mg via INTRAVENOUS
  Filled 2014-12-30: qty 4

## 2014-12-30 MED ORDER — PROPOFOL 10 MG/ML IV BOLUS
INTRAVENOUS | Status: DC | PRN
  Administered 2014-12-30: 150 mg via INTRAVENOUS

## 2014-12-30 MED ORDER — KETOTIFEN FUMARATE 0.025 % OP SOLN
1.0000 [drp] | Freq: Two times a day (BID) | OPHTHALMIC | Status: DC
  Administered 2014-12-30 – 2015-01-12 (×25): 1 [drp] via OPHTHALMIC
  Filled 2014-12-30 (×6): qty 5

## 2014-12-30 MED ORDER — CHLORHEXIDINE GLUCONATE CLOTH 2 % EX PADS: 6.0000 | MEDICATED_PAD | Freq: Once | CUTANEOUS | Status: DC

## 2014-12-30 MED ORDER — FENTANYL CITRATE (PF) 100 MCG/2ML IJ SOLN
25.0000 ug | INTRAMUSCULAR | Status: DC | PRN
  Administered 2014-12-30: 50 ug via INTRAVENOUS
  Administered 2014-12-30 (×2): 25 ug via INTRAVENOUS

## 2014-12-30 MED ORDER — HYDRALAZINE HCL 20 MG/ML IJ SOLN: 5.0000 mg | INTRAMUSCULAR | Status: DC | PRN

## 2014-12-30 MED ORDER — DEXTROSE 5 % IV SOLN
1.5000 g | INTRAVENOUS | Status: AC
  Administered 2014-12-30: 1.5 g via INTRAVENOUS
  Filled 2014-12-30: qty 1.5

## 2014-12-30 MED ORDER — ACETAMINOPHEN 325 MG PO TABS
325.0000 mg | ORAL_TABLET | ORAL | Status: DC | PRN
  Administered 2014-12-31: 650 mg via ORAL
  Filled 2014-12-30: qty 2

## 2014-12-30 MED ORDER — 0.9 % SODIUM CHLORIDE (POUR BTL) OPTIME
TOPICAL | Status: DC | PRN
Start: 2014-12-30 — End: 2014-12-30
  Administered 2014-12-30: 1000 mL

## 2014-12-30 MED ORDER — FAMOTIDINE IN NACL 20-0.9 MG/50ML-% IV SOLN
20.0000 mg | Freq: Two times a day (BID) | INTRAVENOUS | Status: DC
  Administered 2014-12-30 – 2014-12-31 (×3): 20 mg via INTRAVENOUS
  Filled 2014-12-30 (×4): qty 50

## 2014-12-30 MED ORDER — FAMOTIDINE 20 MG PO TABS
20.0000 mg | ORAL_TABLET | Freq: Two times a day (BID) | ORAL | Status: DC
  Administered 2014-12-30: 20 mg via ORAL
  Filled 2014-12-30 (×3): qty 1

## 2014-12-30 MED ORDER — ARTIFICIAL TEARS OP OINT
TOPICAL_OINTMENT | OPHTHALMIC | Status: DC | PRN
  Administered 2014-12-30: 1 via OPHTHALMIC

## 2014-12-30 MED ORDER — FLUOCINONIDE 0.05 % EX CREA
1.0000 "application " | TOPICAL_CREAM | Freq: Two times a day (BID) | CUTANEOUS | Status: DC | PRN
  Filled 2014-12-30: qty 30

## 2014-12-30 MED ORDER — ONDANSETRON HCL 4 MG/2ML IJ SOLN
4.0000 mg | Freq: Four times a day (QID) | INTRAMUSCULAR | Status: DC | PRN
  Administered 2015-01-09: 4 mg via INTRAVENOUS
  Filled 2014-12-30: qty 2

## 2014-12-30 MED ORDER — POTASSIUM CHLORIDE CRYS ER 20 MEQ PO TBCR
20.0000 meq | EXTENDED_RELEASE_TABLET | Freq: Every day | ORAL | Status: AC | PRN
  Administered 2015-01-02: 40 meq via ORAL
  Filled 2014-12-30: qty 2

## 2014-12-30 MED ORDER — SODIUM CHLORIDE 0.9 % IV SOLN
INTRAVENOUS | Status: DC
  Administered 2014-12-30: 1 mL via INTRAVENOUS

## 2014-12-30 SURGICAL SUPPLY — 54 items
CANISTER SUCTION 2500CC (MISCELLANEOUS) ×7 IMPLANT
CLIP TI MEDIUM 24 (CLIP) IMPLANT
CLIP TI WIDE RED SMALL 24 (CLIP) IMPLANT
COVER DOME SNAP 22 D (MISCELLANEOUS) ×7 IMPLANT
COVER SURGICAL LIGHT HANDLE (MISCELLANEOUS) IMPLANT
DRAIN CHANNEL 15F RND FF W/TCR (WOUND CARE) IMPLANT
DRAPE INCISE 23X17 IOBAN STRL (DRAPES) ×2
DRAPE INCISE IOBAN 23X17 STRL (DRAPES) ×5 IMPLANT
DRSG TEGADERM 2-3/8X2-3/4 SM (GAUZE/BANDAGES/DRESSINGS) ×7 IMPLANT
DRSG VAC ATS SM SENSATRAC (GAUZE/BANDAGES/DRESSINGS) ×7 IMPLANT
ELECT REM PT RETURN 9FT ADLT (ELECTROSURGICAL) ×7
ELECTRODE REM PT RTRN 9FT ADLT (ELECTROSURGICAL) ×5 IMPLANT
EVACUATOR SILICONE 100CC (DRAIN) IMPLANT
GAUZE SPONGE 2X2 8PLY STRL LF (GAUZE/BANDAGES/DRESSINGS) ×5 IMPLANT
GAUZE SPONGE 4X4 16PLY XRAY LF (GAUZE/BANDAGES/DRESSINGS) IMPLANT
GLOVE BIO SURGEON STRL SZ7 (GLOVE) IMPLANT
GLOVE BIOGEL PI IND STRL 6.5 (GLOVE) ×5 IMPLANT
GLOVE BIOGEL PI IND STRL 7.5 (GLOVE) ×5 IMPLANT
GLOVE BIOGEL PI INDICATOR 6.5 (GLOVE) ×2
GLOVE BIOGEL PI INDICATOR 7.5 (GLOVE) ×2
GLOVE SURG SS PI 6.5 STRL IVOR (GLOVE) ×14 IMPLANT
GLOVE SURG SS PI 7.0 STRL IVOR (GLOVE) ×14 IMPLANT
GLOVE SURG SS PI 7.5 STRL IVOR (GLOVE) ×7 IMPLANT
GOWN STRL REUS W/ TWL LRG LVL3 (GOWN DISPOSABLE) ×20 IMPLANT
GOWN STRL REUS W/TWL 2XL LVL3 (GOWN DISPOSABLE) ×7 IMPLANT
GOWN STRL REUS W/TWL LRG LVL3 (GOWN DISPOSABLE) ×8
HANDPIECE INTERPULSE COAX TIP (DISPOSABLE) ×2
INSERT FOGARTY SM (MISCELLANEOUS) IMPLANT
KIT BASIN OR (CUSTOM PROCEDURE TRAY) ×7 IMPLANT
KIT ROOM TURNOVER OR (KITS) ×7 IMPLANT
NS IRRIG 1000ML POUR BTL (IV SOLUTION) ×7 IMPLANT
PACK GENERAL/GYN (CUSTOM PROCEDURE TRAY) IMPLANT
PACK PERIPHERAL VASCULAR (CUSTOM PROCEDURE TRAY) IMPLANT
PAD ARMBOARD 7.5X6 YLW CONV (MISCELLANEOUS) ×14 IMPLANT
SET HNDPC FAN SPRY TIP SCT (DISPOSABLE) ×5 IMPLANT
SET MICROPUNCTURE 5F STIFF (MISCELLANEOUS) ×7 IMPLANT
SPONGE GAUZE 2X2 STER 10/PKG (GAUZE/BANDAGES/DRESSINGS) ×2
SPONGE SURGIFOAM ABS GEL 100 (HEMOSTASIS) IMPLANT
STAPLER VISISTAT 35W (STAPLE) ×7 IMPLANT
STOPCOCK MORSE 400PSI 3WAY (MISCELLANEOUS) ×7 IMPLANT
SUT ETHILON 3 0 PS 1 (SUTURE) ×7 IMPLANT
SUT MNCRL AB 4-0 PS2 18 (SUTURE) ×7 IMPLANT
SUT PROLENE 5 0 C 1 24 (SUTURE) IMPLANT
SUT PROLENE 6 0 BV (SUTURE) ×7 IMPLANT
SUT SILK 2 0 FS (SUTURE) IMPLANT
SUT VIC AB 2-0 CT1 27 (SUTURE) ×2
SUT VIC AB 2-0 CT1 TAPERPNT 27 (SUTURE) ×5 IMPLANT
SUT VIC AB 3-0 SH 27 (SUTURE) ×2
SUT VIC AB 3-0 SH 27X BRD (SUTURE) ×5 IMPLANT
SWAB COLLECTION DEVICE MRSA (MISCELLANEOUS) ×7 IMPLANT
SYR 20CC LL (SYRINGE) ×14 IMPLANT
TRAY FOLEY W/METER SILVER 16FR (SET/KITS/TRAYS/PACK) IMPLANT
UNDERPAD 30X30 INCONTINENT (UNDERPADS AND DIAPERS) IMPLANT
WATER STERILE IRR 1000ML POUR (IV SOLUTION) ×7 IMPLANT

## 2014-12-30 NOTE — Progress Notes (Signed)
*  PRELIMINARY RESULTS* Vascular Ultrasound Lower extremity venous duplex has been completed.  Preliminary findings: negative for DVT. Left upper extremity arterial duplex = Patent with biphasic flow.   Landry Mellow, RDMS, RVT  12/30/2014, 4:16 PM

## 2014-12-30 NOTE — Progress Notes (Signed)
ANTIBIOTIC CONSULT NOTE - INITIAL  Pharmacy Consult for Vancomycin Indication: infected AV graft with abscess  Allergies  Allergen Reactions  . Avelox [Moxifloxacin Hcl In Nacl] Nausea And Vomiting  . Betadine [Povidone Iodine] Itching and Rash  . Alendronate Sodium Nausea And Vomiting and Other (See Comments)    dizziness  . Aspirin Nausea Only  . Codeine Nausea And Vomiting  . Doxycycline Nausea And Vomiting  . Fluconazole Nausea And Vomiting  . Hydrocodone Nausea And Vomiting    GI distress  . Hydrocodone-Acetaminophen Nausea And Vomiting  . Morphine And Related Nausea Only  . Neurontin [Gabapentin] Other (See Comments)    Mood changes   . Nsaids Other (See Comments)    Severe gastritis & perforation - avoid NSAIDs when possible  . Quinolones Hives and Itching  . Sertraline Hcl Nausea And Vomiting and Other (See Comments)    Hallucinations   . Sulfamethoxazole Hives and Itching  . Latex Rash  . Sulfa Antibiotics Rash    Patient Measurements: Weight: 85 lb 15.7 oz (39 kg) Adjusted Body Weight:   Vital Signs: Temp: 98.1 F (36.7 C) (09/08 1535) Temp Source: Oral (09/08 1535) BP: 139/67 mmHg (09/08 1535) Pulse Rate: 62 (09/08 1535) Intake/Output from previous day: 09/07 0701 - 09/08 0700 In: -  Out: 950 [Urine:950] Intake/Output from this shift: Total I/O In: 600 [I.V.:600] Out: 5 [Blood:5]  Labs:  Recent Labs  12/29/14 1857 12/30/14 0123  WBC 5.3 5.9  HGB 10.7* 13.2  PLT 266 242  CREATININE 0.71 0.64   Estimated Creatinine Clearance: 42 mL/min (by C-G formula based on Cr of 0.64). No results for input(s): VANCOTROUGH, VANCOPEAK, VANCORANDOM, GENTTROUGH, GENTPEAK, GENTRANDOM, TOBRATROUGH, TOBRAPEAK, TOBRARND, AMIKACINPEAK, AMIKACINTROU, AMIKACIN in the last 72 hours.   Microbiology: Recent Results (from the past 720 hour(s))  Wound culture     Status: None (Preliminary result)   Collection Time: 12/29/14 11:40 PM  Result Value Ref Range Status   Specimen Description WOUND GROIN  Final   Special Requests NONE  Final   Gram Stain   Final    FEW WBC PRESENT,BOTH PMN AND MONONUCLEAR NO SQUAMOUS EPITHELIAL CELLS SEEN NO ORGANISMS SEEN Performed at [facility]    Culture PENDING  Incomplete   Report Status PENDING  Incomplete  Surgical pcr screen     Status: Abnormal   Collection Time: 12/30/14  5:20 AM  Result Value Ref Range Status   MRSA, PCR NEGATIVE NEGATIVE Final   Staphylococcus aureus POSITIVE (A) NEGATIVE Final    Comment:        The Xpert SA Assay (FDA approved for NASAL specimens in patients over 75 years of age), is one component of a comprehensive surveillance program.  Test performance has been validated by [facility] for patients greater than or equal to 51 year old. It is not intended to diagnose infection nor to guide or monitor treatment.     Medical History: Past Medical History  Diagnosis Date  . COPD (chronic obstructive pulmonary disease)   . Pneumonia 12-2011  . GERD (gastroesophageal reflux disease)   . Headache(784.0)   . Arthritis     osteoarthritis  . Allergy   . Depression   . Neuromuscular disorder   . Osteoporosis   . Bronchitis     CURRENTLY AS OF 06/30/12 - HAS COUGH AND FINISHED ANTIBIOTIC FOR BRONCHITIS  . Fibromyalgia   . Pain     ABDOMINAL PAIN AND NAUSEA  . Pain     SOMETIMES PAIN  RIGHT EAR AND NECK--STATES CAUSED BY A "LUMP" ON BACK OF EAR--USES KENALOG CREAM TOPICALLY AS NEEDED.  Marland Kitchen Gastrocutaneous fistula   . Anemia   . Anxiety   . Blood transfusion without reported diagnosis   . Heart murmur     young  . Peripheral vascular disease     hx  ?leg  . History of kidney stones     Medications:  Prescriptions prior to admission  Medication Sig Dispense Refill Last Dose  . clidinium-chlordiazePOXIDE (LIBRAX) 5-2.5 MG per capsule Take 1 capsule by mouth 4 (four) times daily -  before meals and at bedtime.    12/29/2014 at Unknown time  . dicyclomine (BENTYL) 20  MG tablet TAKE 1 TABLET (20 MG TOTAL) BY MOUTH 4 (FOUR) TIMES DAILY - BEFORE MEALS AND AT BEDTIME. 120 tablet 5 12/29/2014 at Unknown time  . feeding supplement, RESOURCE BREEZE, (RESOURCE BREEZE) LIQD Take 1 Container by mouth 3 (three) times daily between meals.   12/29/2014 at Unknown time  . fluocinonide cream (LIDEX) 5.32 % Apply 1 application topically 2 (two) times daily as needed.    12/29/2014 at Unknown time  . ketotifen (ZADITOR) 0.025 % ophthalmic solution Place 1 drop into both eyes 2 (two) times daily.   12/29/2014 at Unknown time  . LYRICA 50 MG capsule Take 50 mg by mouth daily.    12/29/2014 at Unknown time  . ondansetron (ZOFRAN) 4 MG tablet Take 4 mg by mouth every 8 (eight) hours as needed for nausea or vomiting.    12/29/2014 at Unknown time  . polyethylene glycol powder (GLYCOLAX/MIRALAX) powder Take 17 g by mouth 2 (two) times daily as needed. 500 g 1 12/29/2014 at Unknown time  . PROAIR HFA 108 (90 BASE) MCG/ACT inhaler    Past Week at Unknown time  . ranitidine (ZANTAC) 150 MG tablet TAKE 1 TABLET TWICE DAILY 180 tablet 0 12/29/2014 at Unknown time  . Simethicone (GAS-X EXTRA STRENGTH PO) Take 1 tablet by mouth 3 (three) times daily after meals.    12/29/2014 at Unknown time  . amoxicillin-clavulanate (AUGMENTIN) 875-125 MG per tablet Take 1 tablet by mouth every 12 (twelve) hours. (Patient not taking: Reported on 12/10/2014) 20 tablet 0 Completed Course at Unknown time  . cephALEXin (KEFLEX) 500 MG capsule Take 1 capsule (500 mg total) by mouth 2 (two) times daily. (Patient not taking: Reported on 12/29/2014) 30 capsule 0 Completed Course at Unknown time  . Vitamin D, Ergocalciferol, (DRISDOL) 50000 UNITS CAPS capsule Take 1 capsule (50,000 Units total) by mouth every 7 (seven) days. (Patient taking differently: Take 50,000 Units by mouth every 7 (seven) days. On Saturday) 12 capsule 3 12/25/2014  . zoster vaccine live, PF, (ZOSTAVAX) 99242 UNT/0.65ML injection Inject 19,400 Units into the skin once.  Administer at pharmacy (Patient not taking: Reported on 12/29/2014) 1 each 0 Completed Course at Unknown time   Scheduled:  . cefTAZidime (FORTAZ)  IV  2 g Intravenous Q12H  . clidinium-chlordiazePOXIDE  1 capsule Oral TID AC & HS  . dicyclomine  20 mg Oral TID AC & HS  . diphenhydrAMINE  25 mg Oral Q6H  . [START ON 12/31/2014] enoxaparin (LOVENOX) injection  30 mg Subcutaneous Q24H  . famotidine (PEPCID) IV  20 mg Intravenous Q12H  . famotidine  20 mg Oral BID  . feeding supplement  1 Container Oral TID BM  . ketotifen  1 drop Both Eyes BID  . pantoprazole  40 mg Oral Daily  . pregabalin  50  mg Oral Daily  . Vitamin D (Ergocalciferol)  50,000 Units Oral Q7 days   Infusions:  . sodium chloride 50 mL/hr at 12/30/14 1400   Assessment: 69yo female with history of femorofemoral graft presents with groin pain. Pharmacy is consulted to dose vancomycin for infected AV graft with abscess. Pt is afebrile, WBC wnl, sCr 0.64.  Goal of Therapy:  Vancomycin trough level 15-20 mcg/ml  Plan:  Vancomycin 1g IV q24h Measure antibiotic drug levels at steady state Follow up culture results, renal function and clinical course  Andrey Cota. Diona Foley, PharmD Clinical Pharmacist Pager [phone] 12/30/2014,4:30 PM

## 2014-12-30 NOTE — H&P (View-Only) (Signed)
Established Intermittent Claudication  History of Present Illness  Cynthia Bowen is a 69 y.o. (June 15, 1945) female who presents with chief complaint: routine follow-up.  She denies any fever or chills or drainage from left groin.  The patient's legs symptoms have improved.  The patient's symptoms are: intermittent claudication.  Her sx are limited by her limited ambulation.  The patient's treatment regimen currently included: maximal medical management and walking plan.  Patient previous underwent. 1. 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 R iliofem EA w/ DPA, R to L fem-fem [facility] (Dr. Donnetta Hutching) on 09/05/14 for L CIA stent occlusion  Past Medical History  Diagnosis Date  . COPD (chronic obstructive pulmonary disease)   . Pneumonia 12-2011  . GERD (gastroesophageal reflux disease)   . Headache(784.0)   . Arthritis     osteoarthritis  . Allergy   . Depression   . Neuromuscular disorder   . Osteoporosis   . Bronchitis     CURRENTLY AS OF 06/30/12 - HAS COUGH AND FINISHED ANTIBIOTIC FOR BRONCHITIS  . Fibromyalgia   . Pain     ABDOMINAL PAIN AND NAUSEA  . Pain     SOMETIMES PAIN RIGHT EAR AND NECK--STATES CAUSED BY A "LUMP" ON BACK OF EAR--USES KENALOG CREAM TOPICALLY AS NEEDED.  Marland Kitchen Gastrocutaneous fistula   . Anemia   . Anxiety   . Blood transfusion without reported diagnosis   . Heart murmur     young  . Peripheral vascular disease     hx  ?leg  . History of kidney stones     Past Surgical History  Procedure Laterality Date  . Abdominal hysterectomy    . Esophagogastroduodenoscopy  04/18/2012    Procedure: ESOPHAGOGASTRODUODENOSCOPY (EGD);  Surgeon: Inda Castle, MD;  Location: [facility];  Service: Endoscopy;  Laterality: N/A;  . Eus  05/29/2012    Procedure: UPPER ENDOSCOPIC ULTRASOUND (EUS) LINEAR;  Surgeon: Milus Banister, MD;  Location: [facility];  Service: Endoscopy;  Laterality: N/A;  .  Appendectomy    . Spine surgery      CERVICAL SPINE SURGERY X 2 - INCLUDING FUSION; LUMBAR SURGERY FOR RUPTURED DISC  . Eye surgery      BILATERAL CATARACT EXTRACTIONS  . Laparoscopic abdominal exploration N/A 07/02/2012    Procedure: converted to laparotomy with gastric biopsy;  Surgeon: Imogene Burn. Georgette Dover, MD;  Location: [facility];  Service: General;  Laterality: N/A;  Laparoscopic Gastric Biospy attempted.   . Laparotomy N/A 07/07/2012    Procedure: EXPLORATORY LAPAROTOMY repair of gastric perforation with omental graham patch, drainage of abdominal abcess;  Surgeon: Imogene Burn. Georgette Dover, MD;  Location: [facility];  Service: General;  Laterality: N/A;  . Stomach surgery  07/07/2012    Omental patch of gastric perforation  . Laparotomy N/A 07/13/2012    Procedure: EXPLORATORY LAPAROTOMY repair gastric leak;  Surgeon: Edward Jolly, MD;  Location: [facility];  Service: General;  Laterality: N/A;  . Laparotomy N/A 11/11/2012    Procedure: EXPLORATORY LAPAROTOMY;  Surgeon: Imogene Burn. Georgette Dover, MD;  Location: [place];  Service: General;  Laterality: N/A;  . Bowel resection N/A 11/11/2012    Procedure: SMALL BOWEL RESECTION;  Surgeon: Imogene Burn. Georgette Dover, MD;  Location: [place];  Service: General;  Laterality: N/A;  . Minor application of wound vac N/A 11/11/2012    Procedure: APPLICATION OF WOUND VAC;  Surgeon: Imogene Burn. Georgette Dover, MD;  Location: [place];  Service: General;  Laterality: N/A;  . Jejunostomy N/A 11/11/2012    Procedure: PLACEMENT OF FEEDING JEJUNOSTOMY TUBE;  Surgeon: Imogene Burn. Georgette Dover, MD;  Location: [place];  Service: General;  Laterality: N/A;  . Lysis of adhesion N/A 11/11/2012    Procedure: LYSIS OF ADHESION;  Surgeon: Imogene Burn. Georgette Dover, MD;  Location: [place];  Service: General;  Laterality: N/A;  . Hernia repair    . Gastric fistula repair      05/16/2013  . Colonoscopy    . Polypectomy    . Upper gastrointestinal endoscopy    . Abdominal aortagram N/A 08/19/2014    Procedure: ABDOMINAL Maxcine Ham;  Surgeon:  Conrad Durant, MD;  Location: [facility];  Service: Cardiovascular;  Laterality: N/A;  . Abdominal aortic endovascular stent graft N/A 08/26/2014    Procedure: AORTIC ENDOVASCULAR  COVERED STENT ;  Surgeon: Conrad Hubbell, MD;  Location: [facility];  Service: Vascular;  Laterality: N/A;  IVUS  . Femoral-femoral bypass graft Bilateral 09/04/2014    Procedure: Right External Iliac and right comon femoral endarterectomy with patching and right to left BYPASS GRAFT FEMORAL-FEMORAL ARTERY, ;  Surgeon: Rosetta Posner, MD;  Location: [facility];  Service: Vascular;  Laterality: Bilateral;    Social History   Social History  . Marital Status: Single    Spouse Name: N/A  . Number of Children: 0  . Years of Education: N/A   Occupational History  .     Social History Main Topics  . Smoking status: Former Smoker -- 0.50 packs/day for 35 years    Types: Cigarettes    Start date: 01/04/2003    Quit date: 04/17/2005  . Smokeless tobacco: Never Used  . Alcohol Use: No  . Drug Use: No  . Sexual Activity: No   Other Topics Concern  . Not on file   Social History Narrative   Single. Education: [employer]. Exercise.    Family History  Problem Relation Age of Onset  . COPD Mother   . Hyperlipidemia Mother   . Hypertension Maternal Grandmother   . Stroke Maternal Grandmother   . Colon cancer Neg Hx     Current Outpatient Prescriptions  Medication Sig Dispense Refill  . cephALEXin (KEFLEX) 500 MG capsule Take 1 capsule (500 mg total) by mouth 2 (two) times daily. 30 capsule 0  . clidinium-chlordiazePOXIDE (LIBRAX) 5-2.5 MG per capsule     . dicyclomine (BENTYL) 20 MG tablet TAKE 1 TABLET (20 MG TOTAL) BY MOUTH 4 (FOUR) TIMES DAILY - BEFORE MEALS AND AT BEDTIME. 60 tablet 3  . feeding supplement, RESOURCE BREEZE, (RESOURCE BREEZE) LIQD Take 1 Container by mouth 3 (three) times daily between meals.    . fentaNYL (DURAGESIC - DOSED MCG/HR) 25 MCG/HR patch     . fluocinonide cream (LIDEX) 0.05 %     . ketotifen  (ZADITOR) 0.025 % ophthalmic solution Place 1 drop into both eyes 2 (two) times daily.    Marland Kitchen LYRICA 50 MG capsule     . ondansetron (ZOFRAN) 4 MG tablet     . oxyCODONE-acetaminophen (PERCOCET/ROXICET) 5-325 MG per tablet     . PROAIR HFA 108 (90 BASE) MCG/ACT inhaler     . ranitidine (ZANTAC) 150 MG tablet TAKE 1 TABLET TWICE DAILY 180 tablet 0  . Simethicone (GAS-X EXTRA STRENGTH PO) Take 1 tablet by mouth 3 (three) times daily after meals.     . Vitamin D, Ergocalciferol, (DRISDOL) 50000 UNITS CAPS capsule Take 1 capsule (50,000 Units  total) by mouth every 7 (seven) days. (Patient taking differently: Take 50,000 Units by mouth every 7 (seven) days. On Saturday) 12 capsule 3  . zoster vaccine live, PF, (ZOSTAVAX) 16109 UNT/0.65ML injection Inject 19,400 Units into the skin once. Administer at pharmacy 1 each 0  . amoxicillin-clavulanate (AUGMENTIN) 875-125 MG per tablet Take 1 tablet by mouth every 12 (twelve) hours. (Patient not taking: Reported on 12/10/2014) 20 tablet 0   No current facility-administered medications for this visit.     Allergies  Allergen Reactions  . Avelox [Moxifloxacin Hcl In Nacl] Nausea And Vomiting  . Betadine [Povidone Iodine] Itching and Rash  . Alendronate Sodium Nausea And Vomiting and Other (See Comments)    dizziness  . Aspirin Nausea Only  . Codeine Nausea And Vomiting  . Doxycycline Nausea And Vomiting  . Fluconazole Nausea And Vomiting  . Hydrocodone Nausea And Vomiting    GI distress  . Hydrocodone-Acetaminophen Nausea And Vomiting  . Morphine And Related Nausea Only  . Neurontin [Gabapentin] Other (See Comments)    Mood changes   . Nsaids Other (See Comments)    Severe gastritis & perforation - avoid NSAIDs when possible  . Quinolones Hives and Itching  . Sertraline Hcl Nausea And Vomiting and Other (See Comments)    Hallucinations   . Sulfamethoxazole Hives and Itching  . Latex Rash  . Sulfa Antibiotics Rash     REVIEW OF SYSTEMS:   (Positives checked otherwise negative)  CARDIOVASCULAR:   [ ]  chest pain,  [ ]  chest pressure,  [ ]  palpitations,  [ ]  shortness of breath when laying flat,  [ ]  shortness of breath with exertion,   [x]  pain in feet when walking,  [ ]  pain in feet when laying flat, [ ]  history of blood clot in veins (DVT),  [ ]  history of phlebitis,  [ ]  swelling in legs,  [ ]  varicose veins  PULMONARY:   [ ]  productive cough,  [ ]  asthma,  [ ]  wheezing  NEUROLOGIC:   [ ]  weakness in arms or legs,  [ ]  numbness in arms or legs,  [ ]  difficulty speaking or slurred speech,  [ ]  temporary loss of vision in one eye,  [ ]  dizziness  HEMATOLOGIC:   [ ]  bleeding problems,  [ ]  problems with blood clotting too easily  MUSCULOSKEL:   [ ]  joint pain, [ ]  joint swelling  GASTROINTEST:   [ ]  vomiting blood,  [ ]  blood in stool     GENITOURINARY:   [ ]  burning with urination,  [ ]  blood in urine  PSYCHIATRIC:   [ ]  history of major depression  INTEGUMENTARY:   [ ]  rashes,  [ ]  ulcers  CONSTITUTIONAL:   [ ]  fever,  [ ]  chills     Physical Examination  Filed Vitals:   12/24/14 1152  BP: 134/87  Pulse: 94  Temp: 98.9 F (37.2 C)  TempSrc: Oral  Height: 5' 3.5" (1.613 m)  Weight: 87 lb 1.6 oz (39.508 kg)  SpO2: 99%   Body mass index is 15.19 kg/(m^2).  General: A&O x 3, WD, Cachectic,   Pulmonary: Sym exp, good air movt, CTAB, no rales, rhonchi, & wheezing  Cardiac: RRR, Nl S1, S2, no Murmurs, rubs or gallops  Vascular: Vessel Right Left  Radial Palpable Palpable  Brachial Palpable Palpable  Carotid Palpable, without bruit Palpable, without bruit  Aorta Not palpable N/A  Femoral Palpable Palpable  Popliteal Not palpable Not palpable  PT Not Palpable Not Palpable  DP Not Palpable Not Palpable   Gastrointestinal: soft, NTND, no G/R, no HSM, no masses, no CVAT B, R groin fluid collection resolved,   Musculoskeletal: M/S 5/5 throughout , Extremities without  ischemic changes. Fem-fem graft: L side enlarging fluid collection with thinning skin  Neurologic: Pain and light touch intact in extremities Motor exam as listed above   Non-Invasive Vascular Imaging ABI (Date: 12/24/2014)  R: 0.62, DP: mono, PT: mono, TBI: 0.36  L: 0.56, DP: mono, PT: mono, TBI: 0.38  Fem-fem Duplex (Date: 12/24/2014)  Patent fem-fem  R side of graft: 0.88 cm x 1.2 cm fluid collection  L side of graft: 3.3 cm x 1.5 cm fluid collection   Medical Decision Making  STACY SAILER is a 69 y.o. female who presents with:  bilateral leg intermittent claudication without evidence of critical limb ischemia, s/p aortic stenting, R to L fem-fem bypass   Based on the patient's vascular studies and examination, I have offered the patient: L fem-fem graft exploration, possible removal of fem-fem graft if infected.  I suspect the fem-fem graft will not need to be immediately removed.  If patient requires fem-fem removal, reconstruction via a L ax-fem bypass vs fem-fem bypass with deep vein may need to be considered.  I discussed in depth with the patient the nature of atherosclerosis, and emphasized the importance of maximal medical management including strict control of blood pressure, blood glucose, and lipid levels, antiplatelet agents, obtaining regular exercise, and cessation of smoking.    The patient is aware that without maximal medical management the underlying atherosclerotic disease process will progress, limiting the benefit of any interventions. The patient is currently not on a statin: as not medically indicated. The patient is currently on an anti-platelet: Plavix.  Thank you for allowing [place] to participate in this patient's care.   Adele Barthel, MD [facility] Office: [phone] Pager: [phone]  12/24/2014, 12:55 PM

## 2014-12-30 NOTE — Anesthesia Procedure Notes (Signed)
Procedure Name: LMA Insertion Date/Time: 12/30/2014 12:54 PM Performed by: Neldon Newport Pre-anesthesia Checklist: Timeout performed, Patient being monitored, Suction available, Emergency Drugs available and Patient identified Patient Re-evaluated:Patient Re-evaluated prior to inductionOxygen Delivery Method: Circle system utilized Preoxygenation: Pre-oxygenation with 100% oxygen Intubation Type: IV induction Ventilation: Mask ventilation without difficulty LMA: LMA inserted LMA Size: 4.0 Number of attempts: 1 Placement Confirmation: breath sounds checked- equal and bilateral,  positive ETCO2 and ETT inserted through vocal cords under direct vision Tube secured with: Tape Dental Injury: Teeth and Oropharynx as per pre-operative assessment

## 2014-12-30 NOTE — Interval H&P Note (Signed)
[facility]  History and Physical Update  The patient was interviewed and re-examined.  The patient's previous History and Physical has been reviewed and is unchanged from my consult except for: interval drainage of left groin and reported right groin swelling.  There is no change in the plan of care: exploration of femorofemoral graft.    Adele Barthel, MD [facility] [place] Office: [phone] Pager: [phone]  12/30/2014, 7:19 AM

## 2014-12-30 NOTE — Consult Note (Addendum)
[facility]  Date of Admission:  12/29/2014  Date of Consult:  12/30/2014  Reason for Consult: Infected AV graft Referring Physician: Bridgett Larsson  Impression/Recommendation Infected AV graft with abscess COPD GERD  Would Change her ancef to vanco and ceftaz Await her wound Cx Send BCx  Could consider adding rifampin if prosthetics are in place and not removed.   Comment Would broaden her coverage to include MRSA, gram negatives she may have acquired these in hospital.   Thank you so much for this interesting consult,   Bobby Rumpf (pager) [phone] [email or web address]  KAHLEY LEIB is an 69 y.o. female.  HPI: 69 yo F with hx of vascular disease who has hx of R iliofem endarterectomy and R to L fem-fem bypass on 09-05-14.  Was seen in f/u and was complaining of L and R groun swelling. She was adm on 9-8 and underwent exploration of her graft. She was found to have bilateral groin abscesses. She had vac placement as well.  She is without complaints currently although frustrated by her multiple interventions.    Past Medical History  Diagnosis Date  . COPD (chronic obstructive pulmonary disease)   . Pneumonia 12-2011  . GERD (gastroesophageal reflux disease)   . Headache(784.0)   . Arthritis     osteoarthritis  . Allergy   . Depression   . Neuromuscular disorder   . Osteoporosis   . Bronchitis     CURRENTLY AS OF 06/30/12 - HAS COUGH AND FINISHED ANTIBIOTIC FOR BRONCHITIS  . Fibromyalgia   . Pain     ABDOMINAL PAIN AND NAUSEA  . Pain     SOMETIMES PAIN RIGHT EAR AND NECK--STATES CAUSED BY A "LUMP" ON BACK OF EAR--USES KENALOG CREAM TOPICALLY AS NEEDED.  Marland Kitchen Gastrocutaneous fistula   . Anemia   . Anxiety   . Blood transfusion without reported diagnosis   . Heart murmur     young  . Peripheral vascular disease     hx  ?leg  . History of kidney stones     Past Surgical History  Procedure Laterality Date  . Abdominal hysterectomy      . Esophagogastroduodenoscopy  04/18/2012    Procedure: ESOPHAGOGASTRODUODENOSCOPY (EGD);  Surgeon: Inda Castle, MD;  Location: [facility];  Service: Endoscopy;  Laterality: N/A;  . Eus  05/29/2012    Procedure: UPPER ENDOSCOPIC ULTRASOUND (EUS) LINEAR;  Surgeon: Milus Banister, MD;  Location: [facility];  Service: Endoscopy;  Laterality: N/A;  . Appendectomy    . Spine surgery      CERVICAL SPINE SURGERY X 2 - INCLUDING FUSION; LUMBAR SURGERY FOR RUPTURED DISC  . Eye surgery      BILATERAL CATARACT EXTRACTIONS  . Laparoscopic abdominal exploration N/A 07/02/2012    Procedure: converted to laparotomy with gastric biopsy;  Surgeon: Imogene Burn. Georgette Dover, MD;  Location: [facility];  Service: General;  Laterality: N/A;  Laparoscopic Gastric Biospy attempted.   . Laparotomy N/A 07/07/2012    Procedure: EXPLORATORY LAPAROTOMY repair of gastric perforation with omental graham patch, drainage of abdominal abcess;  Surgeon: Imogene Burn. Georgette Dover, MD;  Location: [facility];  Service: General;  Laterality: N/A;  . Stomach surgery  07/07/2012    Omental patch of gastric perforation  . Laparotomy N/A 07/13/2012    Procedure: EXPLORATORY LAPAROTOMY repair gastric leak;  Surgeon: Edward Jolly, MD;  Location: [facility];  Service: General;  Laterality: N/A;  . Laparotomy N/A 11/11/2012    Procedure: EXPLORATORY LAPAROTOMY;  Surgeon: Imogene Burn. Georgette Dover, MD;  Location: [facility];  Service: General;  Laterality: N/A;  . Bowel resection N/A 11/11/2012    Procedure: SMALL BOWEL RESECTION;  Surgeon: Imogene Burn. Georgette Dover, MD;  Location: [facility];  Service: General;  Laterality: N/A;  . Minor application of wound vac N/A 11/11/2012    Procedure: APPLICATION OF WOUND VAC;  Surgeon: Imogene Burn. Georgette Dover, MD;  Location: [place];  Service: General;  Laterality: N/A;  . Jejunostomy N/A 11/11/2012    Procedure: PLACEMENT OF FEEDING JEJUNOSTOMY TUBE;  Surgeon: Imogene Burn. Georgette Dover, MD;  Location: [facility];  Service: General;  Laterality: N/A;  . Lysis of  adhesion N/A 11/11/2012    Procedure: LYSIS OF ADHESION;  Surgeon: Imogene Burn. Georgette Dover, MD;  Location: [facility];  Service: General;  Laterality: N/A;  . Hernia repair    . Gastric fistula repair      05/16/2013  . Colonoscopy    . Polypectomy    . Upper gastrointestinal endoscopy    . Abdominal aortagram N/A 08/19/2014    Procedure: ABDOMINAL Maxcine Ham;  Surgeon: Conrad Loma, MD;  Location: [facility] CATH [facility];  Service: Cardiovascular;  Laterality: N/A;  . Abdominal aortic endovascular stent graft N/A 08/26/2014    Procedure: AORTIC ENDOVASCULAR  COVERED STENT ;  Surgeon: Conrad Hayes, MD;  Location: [place];  Service: Vascular;  Laterality: N/A;  IVUS  . Femoral-femoral bypass graft Bilateral 09/04/2014    Procedure: Right External Iliac and right comon femoral endarterectomy with patching and right to left BYPASS GRAFT FEMORAL-FEMORAL ARTERY, ;  Surgeon: Rosetta Posner, MD;  Location: [facility] OR;  Service: Vascular;  Laterality: Bilateral;     Allergies  Allergen Reactions  . Avelox [Moxifloxacin Hcl In Nacl] Nausea And Vomiting  . Betadine [Povidone Iodine] Itching and Rash  . Alendronate Sodium Nausea And Vomiting and Other (See Comments)    dizziness  . Aspirin Nausea Only  . Codeine Nausea And Vomiting  . Doxycycline Nausea And Vomiting  . Fluconazole Nausea And Vomiting  . Hydrocodone Nausea And Vomiting    GI distress  . Hydrocodone-Acetaminophen Nausea And Vomiting  . Morphine And Related Nausea Only  . Neurontin [Gabapentin] Other (See Comments)    Mood changes   . Nsaids Other (See Comments)    Severe gastritis & perforation - avoid NSAIDs when possible  . Quinolones Hives and Itching  . Sertraline Hcl Nausea And Vomiting and Other (See Comments)    Hallucinations   . Sulfamethoxazole Hives and Itching  . Latex Rash  . Sulfa Antibiotics Rash    Medications:  Scheduled: .  ceFAZolin (ANCEF) IV  1 g Intravenous 3 times per day  . clidinium-chlordiazePOXIDE  1 capsule Oral TID AC & HS  .  dicyclomine  20 mg Oral TID AC & HS  . diphenhydrAMINE  25 mg Oral Q6H  . [START ON 12/31/2014] enoxaparin (LOVENOX) injection  30 mg Subcutaneous Q24H  . famotidine (PEPCID) IV  20 mg Intravenous Q12H  . famotidine  20 mg Oral BID  . feeding supplement  1 Container Oral TID BM  . ketotifen  1 drop Both Eyes BID  . pantoprazole  40 mg Oral Daily  . pregabalin  50 mg Oral Daily  . Vitamin D (Ergocalciferol)  50,000 Units Oral Q7 days    Abtx:  Anti-infectives    Start     Dose/Rate Route Frequency Ordered Stop   12/30/14 2100  ceFAZolin (ANCEF) IVPB 1 g/50 mL premix  1 g 100 mL/hr over 30 Minutes Intravenous 3 times per day 12/30/14 1525     12/30/14 1100  cefUROXime (ZINACEF) 1.5 g in dextrose 5 % 50 mL IVPB     1.5 g 100 mL/hr over 30 Minutes Intravenous To Short Stay 12/30/14 0038 12/30/14 1258   12/29/14 2315  ceFAZolin (ANCEF) IVPB 1 g/50 mL premix     1 g 100 mL/hr over 30 Minutes Intravenous  Once 12/29/14 2307 12/30/14 0030      Total days of antibiotics: 0 ancef          Social History:  reports that she quit smoking about 9 years ago. Her smoking use included Cigarettes. She started smoking about 11 years ago. She has a 17.5 pack-year smoking history. She has never used smokeless tobacco. She reports that she does not drink alcohol or use illicit drugs.  Family History  Problem Relation Age of Onset  . COPD Mother   . Hyperlipidemia Mother   . Hypertension Maternal Grandmother   . Stroke Maternal Grandmother   . Colon cancer Neg Hx     General ROS: hungry. normal BM, normal urination. c/o groin pain. see HPI, 12 point ROS o/w (-).   Blood pressure 139/67, pulse 62, temperature 98.1 F (36.7 C), temperature source Oral, resp. rate 18, weight 39 kg (85 lb 15.7 oz), SpO2 98 %. General appearance: alert, cooperative and no distress Eyes: negative findings: pupils equal, round, reactive to light and accomodation Throat: normal findings: oropharynx pink & moist  without lesions or evidence of thrush Neck: no adenopathy and supple, symmetrical, trachea midline Lungs: clear to auscultation bilaterally Heart: regular rate and rhythm Abdomen: normal findings: bowel sounds normal and soft, non-tender Extremities: edema none Skin: groin wounds are clean, dressings in place. no bleeding. vac in place in R groin.    Results for orders placed or performed during the hospital encounter of 12/29/14 (from the past 48 hour(s))  Comprehensive metabolic panel     Status: Abnormal   Collection Time: 12/29/14  6:57 PM  Result Value Ref Range   Sodium 140 135 - 145 mmol/L   Potassium 4.0 3.5 - 5.1 mmol/L   Chloride 106 101 - 111 mmol/L   CO2 27 22 - 32 mmol/L   Glucose, Bld 85 65 - 99 mg/dL   BUN 8 6 - 20 mg/dL   Creatinine, Ser 0.71 0.44 - 1.00 mg/dL   Calcium 9.0 8.9 - 10.3 mg/dL   Total Protein 6.8 6.5 - 8.1 g/dL   Albumin 3.1 (L) 3.5 - 5.0 g/dL   AST 22 15 - 41 U/L   ALT 15 14 - 54 U/L   Alkaline Phosphatase 90 38 - 126 U/L   Total Bilirubin 0.5 0.3 - 1.2 mg/dL   GFR calc non Af Amer >60 >60 mL/min   GFR calc Af Amer >60 >60 mL/min    Comment: (NOTE) The eGFR has been calculated using the CKD EPI equation. This calculation has not been validated in all clinical situations. eGFR's persistently <60 mL/min signify possible Chronic Kidney Disease.    Anion gap 7 5 - 15  CBC     Status: Abnormal   Collection Time: 12/29/14  6:57 PM  Result Value Ref Range   WBC 5.3 4.0 - 10.5 K/uL   RBC 3.93 3.87 - 5.11 MIL/uL   Hemoglobin 10.7 (L) 12.0 - 15.0 g/dL   HCT 32.8 (L) 36.0 - 46.0 %   MCV 83.5 78.0 - 100.0 fL  MCH 27.2 26.0 - 34.0 pg   MCHC 32.6 30.0 - 36.0 g/dL   RDW 16.5 (H) 11.5 - 15.5 %   Platelets 266 150 - 400 K/uL  I-Stat beta hCG blood, ED ([facility], [facility], [facility] only)     Status: None   Collection Time: 12/29/14  7:15 PM  Result Value Ref Range   I-stat hCG, quantitative <5.0 <5 mIU/mL   Comment 3            Comment:   GEST. AGE      CONC.   (mIU/mL)   <=1 WEEK        5 - 50     2 WEEKS       50 - 500     3 WEEKS       100 - 10,000     4 WEEKS     1,000 - 30,000        FEMALE AND NON-PREGNANT FEMALE:     LESS THAN 5 mIU/mL   Type and screen     Status: None   Collection Time: 12/29/14 11:33 PM  Result Value Ref Range   ABO/RH(D) B POS    Antibody Screen NEG    Sample Expiration 01/01/2015   Protime-INR     Status: None   Collection Time: 12/29/14 11:34 PM  Result Value Ref Range   Prothrombin Time 13.5 11.6 - 15.2 seconds   INR 1.01 0.00 - 1.49  Wound culture     Status: None (Preliminary result)   Collection Time: 12/29/14 11:40 PM  Result Value Ref Range   Specimen Description WOUND GROIN    Special Requests NONE    Gram Stain      FEW WBC PRESENT,BOTH PMN AND MONONUCLEAR NO SQUAMOUS EPITHELIAL CELLS SEEN NO ORGANISMS SEEN Performed at [facility]    Culture PENDING    Report Status PENDING   APTT     Status: None   Collection Time: 12/30/14  1:23 AM  Result Value Ref Range   aPTT 30 24 - 37 seconds  CBC     Status: Abnormal   Collection Time: 12/30/14  1:23 AM  Result Value Ref Range   WBC 5.9 4.0 - 10.5 K/uL   RBC 4.64 3.87 - 5.11 MIL/uL   Hemoglobin 13.2 12.0 - 15.0 g/dL   HCT 38.5 36.0 - 46.0 %   MCV 83.0 78.0 - 100.0 fL   MCH 28.4 26.0 - 34.0 pg   MCHC 34.3 30.0 - 36.0 g/dL   RDW 16.7 (H) 11.5 - 15.5 %   Platelets 242 150 - 400 K/uL  Comprehensive metabolic panel     Status: Abnormal   Collection Time: 12/30/14  1:23 AM  Result Value Ref Range   Sodium 139 135 - 145 mmol/L   Potassium 4.1 3.5 - 5.1 mmol/L   Chloride 106 101 - 111 mmol/L   CO2 24 22 - 32 mmol/L   Glucose, Bld 71 65 - 99 mg/dL   BUN 8 6 - 20 mg/dL   Creatinine, Ser 0.64 0.44 - 1.00 mg/dL   Calcium 9.2 8.9 - 10.3 mg/dL   Total Protein 7.2 6.5 - 8.1 g/dL   Albumin 3.4 (L) 3.5 - 5.0 g/dL   AST 27 15 - 41 U/L   ALT 18 14 - 54 U/L   Alkaline Phosphatase 102 38 - 126 U/L   Total Bilirubin 0.4 0.3 - 1.2 mg/dL   GFR  calc non Af Amer >60 >60  mL/min   GFR calc Af Amer >60 >60 mL/min    Comment: (NOTE) The eGFR has been calculated using the CKD EPI equation. This calculation has not been validated in all clinical situations. eGFR's persistently <60 mL/min signify possible Chronic Kidney Disease.    Anion gap 9 5 - 15  Surgical pcr screen     Status: Abnormal   Collection Time: 12/30/14  5:20 AM  Result Value Ref Range   MRSA, PCR NEGATIVE NEGATIVE   Staphylococcus aureus POSITIVE (A) NEGATIVE    Comment:        The Xpert SA Assay (FDA approved for NASAL specimens in patients over 47 years of age), is one component of a comprehensive surveillance program.  Test performance has been validated by [facility] for patients greater than or equal to 49 year old. It is not intended to diagnose infection nor to guide or monitor treatment.   Urinalysis, Routine w reflex microscopic (not at [facility])     Status: Abnormal   Collection Time: 12/30/14  5:43 AM  Result Value Ref Range   Color, Urine YELLOW YELLOW   APPearance CLEAR CLEAR   Specific Gravity, Urine 1.010 1.005 - 1.030   pH 5.5 5.0 - 8.0   Glucose, UA NEGATIVE NEGATIVE mg/dL   Hgb urine dipstick NEGATIVE NEGATIVE   Bilirubin Urine NEGATIVE NEGATIVE   Ketones, ur NEGATIVE NEGATIVE mg/dL   Protein, ur NEGATIVE NEGATIVE mg/dL   Urobilinogen, UA 0.2 0.0 - 1.0 mg/dL   Nitrite NEGATIVE NEGATIVE   Leukocytes, UA SMALL (A) NEGATIVE  Urine microscopic-add on     Status: None   Collection Time: 12/30/14  5:43 AM  Result Value Ref Range   Squamous Epithelial / LPF RARE RARE   WBC, UA 7-10 <3 WBC/hpf   RBC / HPF 0-2 <3 RBC/hpf   Bacteria, UA RARE RARE  Glucose, capillary     Status: None   Collection Time: 12/30/14  2:02 PM  Result Value Ref Range   Glucose-Capillary 70 65 - 99 mg/dL   Comment 1 Notify RN   Glucose, capillary     Status: Abnormal   Collection Time: 12/30/14  2:52 PM  Result Value Ref Range   Glucose-Capillary 100 (H) 65 - 99  mg/dL   Comment 1 Notify RN    Comment 2 Document in Chart       Component Value Date/Time   SDES WOUND GROIN 12/29/2014 2340   SPECREQUEST NONE 12/29/2014 2340   CULT PENDING 12/29/2014 2340   REPTSTATUS PENDING 12/29/2014 2340   No results found. Recent Results (from the past 240 hour(s))  Wound culture     Status: None (Preliminary result)   Collection Time: 12/29/14 11:40 PM  Result Value Ref Range Status   Specimen Description WOUND GROIN  Final   Special Requests NONE  Final   Gram Stain   Final    FEW WBC PRESENT,BOTH PMN AND MONONUCLEAR NO SQUAMOUS EPITHELIAL CELLS SEEN NO ORGANISMS SEEN Performed at [facility]    Culture PENDING  Incomplete   Report Status PENDING  Incomplete  Surgical pcr screen     Status: Abnormal   Collection Time: 12/30/14  5:20 AM  Result Value Ref Range Status   MRSA, PCR NEGATIVE NEGATIVE Final   Staphylococcus aureus POSITIVE (A) NEGATIVE Final    Comment:        The Xpert SA Assay (FDA approved for NASAL specimens in patients over 49 years of age), is one component of  a comprehensive surveillance program.  Test performance has been validated by [facility] for patients greater than or equal to 46 year old. It is not intended to diagnose infection nor to guide or monitor treatment.       12/30/2014, 4:03 PM     LOS: 0 days    Records and images were personally reviewed where available.

## 2014-12-30 NOTE — Transfer of Care (Signed)
Immediate Anesthesia Transfer of Care Note  Patient: Cynthia Bowen  Procedure(s) Performed: Procedure(s): WOUND EXPLORATION (Left) IRRIGATION AND DEBRIDEMENT of bilateral groin ABSCESSes (Bilateral) APPLICATION OF WOUND VAC to right groin (Right) INTRAOPERATIVE ANGIOGRAM OF LEFT LOWER EXTREMITY (Left)  Patient Location: PACU  Anesthesia Type:General  Level of Consciousness: awake, alert  and oriented  Airway & Oxygen Therapy: Patient Spontanous Breathing and Patient connected to nasal cannula oxygen  Post-op Assessment: Report given to RN, Post -op Vital signs reviewed and stable and Patient moving all extremities X 4  Post vital signs: Reviewed and stable  Last Vitals:  Filed Vitals:   12/30/14 1155  BP: 144/71  Pulse: 61  Temp:   Resp: 20    Complications: No apparent anesthesia complications

## 2014-12-30 NOTE — Progress Notes (Addendum)
Initial Nutrition Assessment  DOCUMENTATION CODES:   Underweight, Severe malnutrition in context of chronic illness  INTERVENTION:   Advance diet as medically appropriate, add Boost Breeze supplements when able  NUTRITION DIAGNOSIS:   Increased nutrient needs related to chronic illness as evidenced by estimated needs  GOAL:   Patient will meet greater than or equal to 90% of their needs  MONITOR:   Diet advancement, PO intake, Labs, Weight trends, I & O's  REASON FOR ASSESSMENT:   Malnutrition Screening Tool, Consult  ASSESSMENT:   69 y.o. Female with PMH of COPD, PNA, PVD, fibromyalgia; presented for evaluation of groin pain.  Pt known to Clinical Nutrition during previous admissions.  Pt with hx of malnutrition which is ongoing.  Reports to this RD she eats "what she can".  States she cannot drink milk and therefore does not drink Ensure and/or Boost supplements.  Does like Boost Breeze.  Currently NPO for surgical procedure.  RD to add once/as able.  Nutrition-Focused physical exam completed. Findings are severe fat depletion, severe muscle depletion, and no edema.   Diet Order:  Diet NPO time specified Except for: Sips with Meds Diet clear liquid Room service appropriate?: Yes; Fluid consistency:: Thin  Skin:  Reviewed, no issues  Last BM:  N/A  Height:   Ht Readings from Last 1 Encounters:  12/24/14 5' 3.5" (1.613 m)    Weight:   Wt Readings from Last 1 Encounters:  12/30/14 85 lb 15.7 oz (39 kg)    Ideal Body Weight:  56.8 kg  BMI:  Body mass index is 14.99 kg/(m^2).  Estimated Nutritional Needs:   Kcal:  1200-1400  Protein:  65-75 gm  Fluid:  >/= 1.5 L  EDUCATION NEEDS:   No education needs identified at this time  Arthur Holms, RD, LDN Pager #: [phone] After-Hours Pager #: [phone]

## 2014-12-30 NOTE — Anesthesia Preprocedure Evaluation (Addendum)
Anesthesia Evaluation  Patient identified by MRN, date of birth, ID band Patient awake    Reviewed: Allergy & Precautions, H&P , NPO status , Patient's Chart, lab work & pertinent test results  Airway Mallampati: II  TM Distance: >3 FB Neck ROM: Full    Dental no notable dental hx. (+) Dental Advidsory Given   Pulmonary pneumonia, resolved, COPD, former smoker,    Pulmonary exam normal breath sounds clear to auscultation       Cardiovascular + Peripheral Vascular Disease  Normal cardiovascular exam+ Valvular Problems/Murmurs  Rhythm:Regular Rate:Normal     Neuro/Psych  Headaches, PSYCHIATRIC DISORDERS  Neuromuscular disease negative neurological ROS  negative psych ROS   GI/Hepatic negative GI ROS, Neg liver ROS, GERD  Medicated and Controlled,  Endo/Other  negative endocrine ROS  Renal/GU negative Renal ROS  negative genitourinary   Musculoskeletal negative musculoskeletal ROS (+)   Abdominal   Peds negative pediatric ROS (+)  Hematology  (+) anemia ,   Anesthesia Other Findings   Reproductive/Obstetrics negative OB ROS                            Anesthesia Physical Anesthesia Plan  ASA: III  Anesthesia Plan: General   Post-op Pain Management:    Induction: Intravenous  Airway Management Planned: Oral ETT  Additional Equipment:   Intra-op Plan:   Post-operative Plan: Extubation in OR  Informed Consent: I have reviewed the patients History and Physical, chart, labs and discussed the procedure including the risks, benefits and alternatives for the proposed anesthesia with the patient or authorized representative who has indicated his/her understanding and acceptance.   Dental advisory given and Dental Advisory Given  Plan Discussed with: CRNA, Surgeon and Anesthesiologist  Anesthesia Plan Comments:        Anesthesia Quick Evaluation

## 2014-12-30 NOTE — Op Note (Signed)
OPERATIVE NOTE   PROCEDURE: 1. Incision and drainage bilateral groin abscesses 2. Right groin negative pressure dressing placement 3. Percutaneous cannulation of femorofemoral graft 4. Left leg runoff   PRE-OPERATIVE DIAGNOSIS: possible abscesses adjacent to femorofemoral graft  POST-OPERATIVE DIAGNOSIS: same as above   SURGEON: Adele Barthel, MD  ASSISTANT(S): Leontine Locket, PAC   ANESTHESIA: general  ESTIMATED BLOOD LOSS: 30 cc  FINDING(S): 1.  Frank pus overlying left side of femorofemoral graft 2.  Abscess cavity overlying portion of right femorofemoral graft: graft evident at base of cavity 3.  Chronically occluded left superficial femoral artery 4.  Patent left profunda femoral artery with reconstitution of popliteal artery at knee level  SPECIMEN(S):  Right and left aerobic swabs of abscess cavities  INDICATIONS:   Cynthia Bowen is a 69 y.o. female who presents with drainage from left groin adjacent to femorofemoral bypass and sudden onset of fluid accumulation in right groin.  I recommended going to the operating room to drain the abscesses and determining if the graft was involved.  We discussed holding off on excision of the femorofemoral bypass if infected until a definite plan was determined.     DESCRIPTION: After obtaining full informed written consent, the patient was brought back to the operating room and placed supine upon the operating table.  The patient received IV antibiotics prior to induction.  After obtaining adequate anesthesia, the patient was prepped and draped in the standard fashion for: bilateral groin exploration and possible abscess drainage.  I first turned my attention to the right groin.  There was a new fluid collection just inferior to the femorofemoral bypass graft.  I made a skin incision in this fluid collection.  This drained ~10 cc frank pus.  I took a wound culture with an aerobic swab.  I then washed out this wound with 1500 cc of  sterile normal saline with a Pulsavac.    I then turned my attention to the left fluid collection.  It looked partial drainaged, consistent with the clinical history.  The opening was more superior than expected as the fluid collection on this side was previously inferior to the left side of the femorofemoral graft.  I made incision with a 11-blade and extended this slightly superiorly.  There was frank pus in this fluid collection.  The femorofemoral graft was immediately evident in the wound.  I washed out this wound with 500 cc of sterile normal saline with a Pulsavac.  At this point, I repaired this incision with interrupted 4-0 Nylon and placed a sterile bandage.  I turned my attention to the right groin and fashioned a small VAC sponge for this groin.  I secured this with an adhesive panel.  I cut a hole for the lilypad and the VAC tubing was connected to the VAC pump.  The dressing became adherent.  At this point, I felt that determining the runoff in the left thigh was going to be necessary to determine her reconstruction options.  I cannulated the femorofemoral graft on the right side with a micropuncture needle.  I loaded a microwire through the needle and exchanged the needle for a microsheath.  At this point, I did hand injections to image the left femoral bifurcation and proximal thigh.  Based on these images, this patient is NOT a candidate for an left axillopopliteal bypass as the popliteal artery does not reconstitute until the knee level.    Additional studies will be obtained to determine her reconstruction options.  COMPLICATIONS: none  CONDITION: guarded   Adele Barthel, MD [facility] Office: [phone] Pager: [phone]  12/30/2014, 1:52 PM

## 2014-12-30 NOTE — Progress Notes (Signed)
Pt arrived on unit. VSS. Complained of pain and nausea address with medication. Will continue to monitor.   Angus Seller

## 2014-12-30 NOTE — Progress Notes (Signed)
Pt refused to sign consent until she spoke with vascular surgeon.  MD stated he will get consent in pre-op area and to send pt down when transport arrives.

## 2014-12-31 ENCOUNTER — Encounter (HOSPITAL_COMMUNITY)
Admission: EM | Disposition: A | Discharge status: Nursing Home (Includes ICF) | Payer: Self-pay | Source: Home / Self Care | Attending: Vascular Surgery

## 2014-12-31 ENCOUNTER — Inpatient Hospital Stay (HOSPITAL_COMMUNITY): Payer: Medicare Other

## 2014-12-31 ENCOUNTER — Inpatient Hospital Stay (HOSPITAL_COMMUNITY): Payer: Medicare Other | Admitting: Certified Registered Nurse Anesthetist

## 2014-12-31 ENCOUNTER — Encounter (HOSPITAL_COMMUNITY): Payer: Self-pay | Admitting: Vascular Surgery

## 2014-12-31 DIAGNOSIS — I739 Peripheral vascular disease, unspecified: Secondary | ICD-10-CM

## 2014-12-31 DIAGNOSIS — T82898A Other specified complication of vascular prosthetic devices, implants and grafts, initial encounter: Secondary | ICD-10-CM

## 2014-12-31 DIAGNOSIS — R928 Other abnormal and inconclusive findings on diagnostic imaging of breast: Secondary | ICD-10-CM | POA: Insufficient documentation

## 2014-12-31 HISTORY — PX: PATCH ANGIOPLASTY: SHX6230

## 2014-12-31 HISTORY — PX: AXILLARY-FEMORAL BYPASS GRAFT: SHX894

## 2014-12-31 HISTORY — PX: FEMORAL-FEMORAL BYPASS GRAFT: SHX936

## 2014-12-31 HISTORY — PX: INTRAOPERATIVE ARTERIOGRAM: SHX5157

## 2014-12-31 LAB — CBC
HCT: 35 % — ABNORMAL LOW (ref 36.0–46.0)
Hemoglobin: 11.8 g/dL — ABNORMAL LOW (ref 12.0–15.0)
MCH: 28 pg (ref 26.0–34.0)
MCHC: 33.7 g/dL (ref 30.0–36.0)
MCV: 83.1 fL (ref 78.0–100.0)
PLATELETS: 258 10*3/uL (ref 150–400)
RBC: 4.21 MIL/uL (ref 3.87–5.11)
RDW: 16.4 % — ABNORMAL HIGH (ref 11.5–15.5)
WBC: 5.2 10*3/uL (ref 4.0–10.5)

## 2014-12-31 LAB — COMPREHENSIVE METABOLIC PANEL
ALBUMIN: 2.7 g/dL — AB (ref 3.5–5.0)
ALT: 12 U/L — AB (ref 14–54)
AST: 18 U/L (ref 15–41)
Alkaline Phosphatase: 84 U/L (ref 38–126)
Anion gap: 8 (ref 5–15)
CHLORIDE: 104 mmol/L (ref 101–111)
CO2: 26 mmol/L (ref 22–32)
CREATININE: 0.55 mg/dL (ref 0.44–1.00)
Calcium: 8.8 mg/dL — ABNORMAL LOW (ref 8.9–10.3)
GFR calc non Af Amer: >60 mL/min (ref >60)
Glucose, Bld: 98 mg/dL (ref 65–99)
Potassium: 3.8 mmol/L (ref 3.5–5.1)
SODIUM: 138 mmol/L (ref 135–145)
Total Bilirubin: 0.4 mg/dL (ref 0.3–1.2)
Total Protein: 6.6 g/dL (ref 6.5–8.1)

## 2014-12-31 LAB — C-REACTIVE PROTEIN: CRP: 0.8 mg/dL (ref <1.0)

## 2014-12-31 LAB — PREALBUMIN: PREALBUMIN: 15.1 mg/dL — AB (ref 18–38)

## 2014-12-31 SURGERY — CREATION, BYPASS, ARTERIAL, FEMORAL TO FEMORAL, USING GRAFT
Anesthesia: General | Site: Leg Lower | Laterality: Right

## 2014-12-31 MED ORDER — LACTATED RINGERS IV SOLN: INTRAVENOUS | Status: DC

## 2014-12-31 MED ORDER — THROMBIN 20000 UNITS EX SOLR
CUTANEOUS | Status: AC
  Filled 2014-12-31: qty 20000

## 2014-12-31 MED ORDER — 0.9 % SODIUM CHLORIDE (POUR BTL) OPTIME
TOPICAL | Status: DC | PRN
  Administered 2014-12-31: 2000 mL
  Administered 2014-12-31 (×2): 1000 mL

## 2014-12-31 MED ORDER — LACTATED RINGERS IV SOLN
INTRAVENOUS | Status: DC
  Administered 2014-12-31: 14:00:00 via INTRAVENOUS

## 2014-12-31 MED ORDER — DEXTROSE 5 % IV SOLN
INTRAVENOUS | Status: AC
  Filled 2014-12-31: qty 1.5

## 2014-12-31 MED ORDER — PROPOFOL 10 MG/ML IV BOLUS
INTRAVENOUS | Status: DC | PRN
  Administered 2014-12-31: 120 mg via INTRAVENOUS
  Administered 2014-12-31: 10 mg via INTRAVENOUS
  Administered 2014-12-31: 20 mg via INTRAVENOUS
  Administered 2014-12-31 (×2): 10 mg via INTRAVENOUS
  Administered 2014-12-31: 20 mg via INTRAVENOUS
  Administered 2014-12-31: 10 mg via INTRAVENOUS

## 2014-12-31 MED ORDER — HYDROMORPHONE HCL 1 MG/ML IJ SOLN
0.2500 mg | INTRAMUSCULAR | Status: DC | PRN
  Administered 2014-12-31 (×3): 0.5 mg via INTRAVENOUS

## 2014-12-31 MED ORDER — PROTAMINE SULFATE 10 MG/ML IV SOLN
INTRAVENOUS | Status: DC | PRN
  Administered 2014-12-31: 30 mg via INTRAVENOUS

## 2014-12-31 MED ORDER — DEXTROSE 5 % IV SOLN
1.5000 g | Freq: Two times a day (BID) | INTRAVENOUS | Status: DC
  Filled 2014-12-31: qty 1.5

## 2014-12-31 MED ORDER — LIDOCAINE HCL (CARDIAC) 20 MG/ML IV SOLN
INTRAVENOUS | Status: DC | PRN
  Administered 2014-12-31: 60 mg via INTRAVENOUS

## 2014-12-31 MED ORDER — MIDAZOLAM HCL 2 MG/2ML IJ SOLN
INTRAMUSCULAR | Status: AC
  Filled 2014-12-31: qty 2

## 2014-12-31 MED ORDER — FENTANYL CITRATE (PF) 100 MCG/2ML IJ SOLN
INTRAMUSCULAR | Status: DC | PRN
  Administered 2014-12-31 (×2): 25 ug via INTRAVENOUS
  Administered 2014-12-31: 100 ug via INTRAVENOUS
  Administered 2014-12-31 (×3): 25 ug via INTRAVENOUS
  Administered 2014-12-31 (×2): 50 ug via INTRAVENOUS
  Administered 2014-12-31 (×2): 25 ug via INTRAVENOUS
  Administered 2014-12-31 (×2): 50 ug via INTRAVENOUS
  Administered 2014-12-31: 25 ug via INTRAVENOUS
  Administered 2014-12-31: 50 ug via INTRAVENOUS

## 2014-12-31 MED ORDER — THROMBIN 20000 UNITS EX SOLR
CUTANEOUS | Status: AC
  Filled 2014-12-31: qty 40000

## 2014-12-31 MED ORDER — LACTATED RINGERS IV SOLN
INTRAVENOUS | Status: DC | PRN
  Administered 2014-12-31 (×2): via INTRAVENOUS

## 2014-12-31 MED ORDER — HEPARIN SODIUM (PORCINE) 1000 UNIT/ML IJ SOLN
INTRAMUSCULAR | Status: DC | PRN
  Administered 2014-12-31: 4000 [IU] via INTRAVENOUS
  Administered 2014-12-31: 2000 [IU] via INTRAVENOUS

## 2014-12-31 MED ORDER — DEXTROSE 5 % IV SOLN
1.5000 g | Freq: Once | INTRAVENOUS | Status: AC
  Administered 2014-12-31: 1.5 g via INTRAVENOUS
  Filled 2014-12-31: qty 1.5

## 2014-12-31 MED ORDER — HYDROMORPHONE HCL 1 MG/ML IJ SOLN
INTRAMUSCULAR | Status: AC
  Administered 2014-12-31: 0.5 mg via INTRAVENOUS
  Filled 2014-12-31: qty 1

## 2014-12-31 MED ORDER — PAPAVERINE HCL 30 MG/ML IJ SOLN
INTRAMUSCULAR | Status: AC
  Filled 2014-12-31: qty 2

## 2014-12-31 MED ORDER — HYDROMORPHONE HCL 1 MG/ML IJ SOLN
INTRAMUSCULAR | Status: AC
  Filled 2014-12-31: qty 1

## 2014-12-31 MED ORDER — CEFAZOLIN SODIUM-DEXTROSE 2-3 GM-% IV SOLR
INTRAVENOUS | Status: DC | PRN
  Administered 2014-12-31: 2 g via INTRAVENOUS

## 2014-12-31 MED ORDER — FENTANYL CITRATE (PF) 100 MCG/2ML IJ SOLN
INTRAMUSCULAR | Status: AC
  Filled 2014-12-31: qty 2

## 2014-12-31 MED ORDER — ONDANSETRON HCL 4 MG/2ML IJ SOLN
INTRAMUSCULAR | Status: DC | PRN
  Administered 2014-12-31: 4 mg via INTRAVENOUS

## 2014-12-31 MED ORDER — PAPAVERINE HCL 30 MG/ML IJ SOLN
INTRAMUSCULAR | Status: DC | PRN
  Administered 2014-12-31: 2 mL

## 2014-12-31 MED ORDER — SODIUM CHLORIDE 0.9 % IV SOLN
INTRAVENOUS | Status: DC | PRN
  Administered 2014-12-31: 50 mL

## 2014-12-31 MED ORDER — ENOXAPARIN SODIUM 30 MG/0.3ML ~~LOC~~ SOLN
30.0000 mg | SUBCUTANEOUS | Status: DC
Start: 2015-01-01 — End: 2015-01-03
  Administered 2015-01-01 – 2015-01-02 (×2): 30 mg via SUBCUTANEOUS
  Filled 2014-12-31 (×5): qty 0.3

## 2014-12-31 MED ORDER — SODIUM CHLORIDE 0.9 % IR SOLN
Status: DC | PRN
  Administered 2014-12-31: 3000 mL

## 2014-12-31 MED ORDER — ROCURONIUM BROMIDE 100 MG/10ML IV SOLN
INTRAVENOUS | Status: DC | PRN
  Administered 2014-12-31: 25 mg via INTRAVENOUS
  Administered 2014-12-31: 15 mg via INTRAVENOUS

## 2014-12-31 MED ORDER — ARTIFICIAL TEARS OP OINT
TOPICAL_OINTMENT | OPHTHALMIC | Status: DC | PRN
Start: 2014-12-31 — End: 2014-12-31
  Administered 2014-12-31: 1 via OPHTHALMIC

## 2014-12-31 MED ORDER — MAGNESIUM SULFATE 2 GM/50ML IV SOLN: 2.0000 g | Freq: Every day | INTRAVENOUS | Status: DC | PRN

## 2014-12-31 MED ORDER — DEXAMETHASONE SODIUM PHOSPHATE 4 MG/ML IJ SOLN
INTRAMUSCULAR | Status: DC | PRN
  Administered 2014-12-31: 4 mg via INTRAVENOUS

## 2014-12-31 MED ORDER — THROMBIN 20000 UNITS EX SOLR
CUTANEOUS | Status: DC | PRN
  Administered 2014-12-31 (×4): 20 mL via TOPICAL

## 2014-12-31 MED ORDER — SODIUM CHLORIDE 0.9 % IR SOLN
Status: DC | PRN
  Administered 2014-12-31: 500 mL

## 2014-12-31 SURGICAL SUPPLY — 73 items
BLADE SURG 10 STRL SS (BLADE) ×6 IMPLANT
BLADE SURG 11 STRL SS (BLADE) ×6 IMPLANT
CANISTER SUCTION 2500CC (MISCELLANEOUS) ×6 IMPLANT
CLIP TI MEDIUM 24 (CLIP) ×6 IMPLANT
CLIP TI WIDE RED SMALL 24 (CLIP) ×12 IMPLANT
CONT SPEC 4OZ CLIKSEAL STRL BL (MISCELLANEOUS) ×12 IMPLANT
COVER BACK TABLE 60X90IN (DRAPES) ×6 IMPLANT
COVER MAYO STAND STRL (DRAPES) ×6 IMPLANT
DRAIN CHANNEL 15F RND FF W/TCR (WOUND CARE) IMPLANT
DRAIN PENROSE 1/2X12 LTX STRL (WOUND CARE) ×6 IMPLANT
DRAPE C-ARM 42X72 X-RAY (DRAPES) ×6 IMPLANT
DRSG OPSITE POSTOP 4X6 (GAUZE/BANDAGES/DRESSINGS) ×6 IMPLANT
DRSG PAD ABDOMINAL 8X10 ST (GAUZE/BANDAGES/DRESSINGS) ×12 IMPLANT
DRSG TEGADERM 4X4.75 (GAUZE/BANDAGES/DRESSINGS) ×12 IMPLANT
ELECT REM PT RETURN 9FT ADLT (ELECTROSURGICAL) ×6
ELECTRODE REM PT RTRN 9FT ADLT (ELECTROSURGICAL) ×4 IMPLANT
EVACUATOR SILICONE 100CC (DRAIN) IMPLANT
GAUZE SPONGE 2X2 8PLY STRL LF (GAUZE/BANDAGES/DRESSINGS) ×4 IMPLANT
GAUZE SPONGE 4X4 12PLY STRL (GAUZE/BANDAGES/DRESSINGS) ×6 IMPLANT
GAUZE SPONGE 4X4 16PLY XRAY LF (GAUZE/BANDAGES/DRESSINGS) ×12 IMPLANT
GLOVE BIO SURGEON STRL SZ7 (GLOVE) IMPLANT
GLOVE BIOGEL PI IND STRL 6.5 (GLOVE) ×36 IMPLANT
GLOVE BIOGEL PI IND STRL 7.5 (GLOVE) ×4 IMPLANT
GLOVE BIOGEL PI IND STRL 8 (GLOVE) ×4 IMPLANT
GLOVE BIOGEL PI INDICATOR 6.5 (GLOVE) ×18
GLOVE BIOGEL PI INDICATOR 7.5 (GLOVE) ×2
GLOVE BIOGEL PI INDICATOR 8 (GLOVE) ×2
GLOVE SURG SS PI 6.5 STRL IVOR (GLOVE) ×78 IMPLANT
GLOVE SURG SS PI 7.0 STRL IVOR (GLOVE) ×6 IMPLANT
GLOVE SURG SS PI 7.5 STRL IVOR (GLOVE) ×6 IMPLANT
GOWN STRL REUS W/ TWL LRG LVL3 (GOWN DISPOSABLE) ×12 IMPLANT
GOWN STRL REUS W/TWL LRG LVL3 (GOWN DISPOSABLE) ×6
GRAFT PROPATEN W/RING 8X80X70 (Vascular Products) ×6 IMPLANT
HANDPIECE INTERPULSE COAX TIP (DISPOSABLE) ×2
INSERT FOGARTY SM (MISCELLANEOUS) ×6 IMPLANT
KIT BASIN OR (CUSTOM PROCEDURE TRAY) ×6 IMPLANT
KIT ROOM TURNOVER OR (KITS) ×6 IMPLANT
LIQUID BAND (GAUZE/BANDAGES/DRESSINGS) ×12 IMPLANT
LOOP VESSEL MINI RED (MISCELLANEOUS) ×6 IMPLANT
NS IRRIG 1000ML POUR BTL (IV SOLUTION) ×12 IMPLANT
PACK PERIPHERAL VASCULAR (CUSTOM PROCEDURE TRAY) ×6 IMPLANT
PAD ARMBOARD 7.5X6 YLW CONV (MISCELLANEOUS) ×12 IMPLANT
PATCH VASC XENOSURE 1CMX6CM (Vascular Products) ×2 IMPLANT
PATCH VASC XENOSURE 1X6 (Vascular Products) ×4 IMPLANT
SET COLLECT BLD 21X3/4 12 PB (MISCELLANEOUS) ×6 IMPLANT
SET HNDPC FAN SPRY TIP SCT (DISPOSABLE) ×4 IMPLANT
SPONGE GAUZE 2X2 STER 10/PKG (GAUZE/BANDAGES/DRESSINGS) ×2
SPONGE GAUZE 4X4 12PLY STER LF (GAUZE/BANDAGES/DRESSINGS) ×12 IMPLANT
SPONGE LAP 18X18 X RAY DECT (DISPOSABLE) ×6 IMPLANT
SPONGE SURGIFOAM ABS GEL 100 (HEMOSTASIS) ×24 IMPLANT
STAPLER VISISTAT 35W (STAPLE) ×6 IMPLANT
STOPCOCK 4 WAY LG BORE MALE ST (IV SETS) ×6 IMPLANT
SUT MNCRL AB 4-0 PS2 18 (SUTURE) ×30 IMPLANT
SUT PROLENE 5 0 C 1 24 (SUTURE) ×36 IMPLANT
SUT PROLENE 6 0 BV (SUTURE) ×18 IMPLANT
SUT PROLENE 6 0 C 1 24 (SUTURE) ×6 IMPLANT
SUT SILK 2 0 FS (SUTURE) IMPLANT
SUT VIC AB 2-0 CT1 27 (SUTURE) ×10
SUT VIC AB 2-0 CT1 TAPERPNT 27 (SUTURE) ×20 IMPLANT
SUT VIC AB 2-0 CTB1 (SUTURE) ×6 IMPLANT
SUT VIC AB 3-0 SH 27 (SUTURE) ×12
SUT VIC AB 3-0 SH 27X BRD (SUTURE) ×24 IMPLANT
SUT VICRYL 4-0 PS2 18IN ABS (SUTURE) ×6 IMPLANT
SWAB COLLECTION DEVICE MRSA (MISCELLANEOUS) ×6 IMPLANT
SYR 30ML LL (SYRINGE) ×6 IMPLANT
SYR BULB IRRIGATION 50ML (SYRINGE) ×6 IMPLANT
TAPE CLOTH SURG 4X10 WHT LF (GAUZE/BANDAGES/DRESSINGS) ×6 IMPLANT
TRAY FOLEY W/METER SILVER 16FR (SET/KITS/TRAYS/PACK) IMPLANT
TUBE CONNECTING 20'X1/4 (TUBING) ×1
TUBE CONNECTING 20X1/4 (TUBING) ×5 IMPLANT
TUBING EXTENTION W/L.L. (IV SETS) ×6 IMPLANT
UNDERPAD 30X30 INCONTINENT (UNDERPADS AND DIAPERS) ×6 IMPLANT
WATER STERILE IRR 1000ML POUR (IV SOLUTION) ×6 IMPLANT

## 2014-12-31 NOTE — Op Note (Signed)
    NAME: Cynthia Bowen   MRN: [id number] DOB: 11/29/1945    DATE OF OPERATION: 12/31/2014  PREOP DIAGNOSIS: Infected femorofemoral bypass graft  POSTOP DIAGNOSIS: same  PROCEDURE: left axilla deep femoral artery bypass with 8 mm PTFE graft  CO-SURGEON's: Judeth Cornfield. Scot Dock, MD, Adele Barthel, MD  ASSIST: Leontine Locket, PA  ANESTHESIA: Gen.   EBL: per anesthesia records  TECHNIQUE: the axillary artery had been dissected free by Dr. Bridgett Larsson as dictated separately. In addition he had dissected out the deep femoral artery lateral to the sartorius. In order to limit ischemic time while the infected graft was being removed I performed the left axillary deep femoral artery bypass with 8 mm PTFE externally supported graft. A tunnel was created from the groin incision to the axillary incision. Her tissue was difficult to tunnel through it the chest level and therefore made one counterincision in the mid chest nor to allow safe tunneling. The 8 mm graft was tunneled from the groin to the axillary incision. The patient was then heparinized. The counterincision was closed with a deep layer of 3-0 Vicryl and the skin closed with 4-0 Vicryl. Liquid band was applied. A sterile dressing was applied.  Next attention was turned to the axillary anastomosis. The axillary artery was clamped proximally and distally and a longitudinal arteriotomy was made. The 7 mm graft was slightly spatulated and sewn in to side to the axillary artery using continuous 50 proline suture. Prior to completing the anastomosis the artery was backbled and flushed and the anastomosis completed. Gelfoam and thrombin was applied for hemostasis. The grafts and the appropriate length for anastomosis to the deep femoral artery. The deep femoral artery was clamped proximally and distally and a longitudinal arteriotomy was made. I excised the externally supported rings from the graft and the graft was cut to appropriate length and sewn end to side  to the deep femoral artery using continuous 50 proline suture. At the completion was a good Doppler signal in the deep femoral artery proximal and distal to the anastomosis. Hemostasis was obtained using Gelfoam and thrombin. The incision at the deep femoral artery level was closed with 2 deep layers of 2-0 Vicryl and the skin closed with 4-0 Vicryl. Liquid band was applied. Dressing was applied.  Next attention was turned to the axillary incision. After hemostasis had been obtained and this wound was closed with a deep layer of 3-0 Vicryl and the skin closed with 4-0 Vicryl. Liquid bandage was applied. A sterile dressing was applied. The remainder of the dictation is as dictated by Dr. Adele Barthel.   Deitra Mayo, MD, FACS [facility]  DATE OF DICTATION:   12/31/2014

## 2014-12-31 NOTE — Op Note (Signed)
OPERATIVE NOTE   PROCEDURE: 1. Extended repeat bilateral femoral artery exposure 2. Excision of infected femorofemoral graft 3. Bovine patch angioplasty of bilateral common femoral artery  4. Left leg angiogram  (To be dictated by Dr. Scot Dock) 4.  Left axilloprofunda bypass  PRE-OPERATIVE DIAGNOSIS: infected femorofemoral graft  POST-OPERATIVE DIAGNOSIS: same as above   COSURGEONS: Adele Barthel, MD; Gae Gallop, MD  ASSISTANT(S): Leontine Locket, [facility]   ANESTHESIA: general  ESTIMATED BLOOD LOSS: 100 cc  FINDING(S): 1.  Frank pus tracking around left side of femorofemoral graft 2.  No obvious infection of either arterial anastomosis 3.  No obvious infection of dacron patched right iliofemoral artery 4.  Palpable pulse in both femoral arteries at end of case 5.  Dopplerable right anterior tibial artery  6.  No dopplerable left pedal pulse: felt due to vasospasm  7.  Left runoff demonstrates intact profunda flow to reconstituted at-the-knee popliteal artery with runoff via anterior tibial artery  8.  Likely vasospasm in the distal calf but evidence of collateral flow to left foot   SPECIMEN(S):  Aerobic cultures of pus surrounding graft, wound culture of graft   INDICATIONS:   Cynthia Bowen is a 69 y.o. female who presents with evidence of infection of the her femorofemoral graft on exploration of both groins.  I discussed with the patient proceeding with excision of femorofemoral bypass and axillopopliteal vs axilloprofunda bypass.  The risk, benefits, and alternative for bypass operations were discussed with the patient.  The patient is aware the risks include but are not limited to: bleeding, infection, myocardial infarction, stroke, limb loss, nerve damage, need for additional procedures in the future, wound complications, and inability to complete the bypass.  The patient is aware of these risks and agreed to proceed.   DESCRIPTION: After obtaining full informed  written consent, the patient was brought back to the operating room and placed supine upon the operating table.  The patient received IV antibiotics prior to induction.  After obtaining adequate anesthesia, the patient was prepped and draped in the standard fashion for: bilateral femorofemoral excision and left axillopopliteal bypass.    I turned my attention first to determining if there was an adequate distal target for an axilloprofunda bypass.  I made an incision lateral to the sartorius muscle distally in the proximal 1/3 thigh I dissected down to the muscle and mobilized it medially.  I then dissected through the subcutaneous tissue until I identified nerves, veins, and perforating arterial branches.  I followed the branches to the main profunda femoral artery.  I appeared to be 3 mm in diameter and suitable for a distal target.  I packed this wound with a wet lap pad.  I turned my attention to the left chest.  I made an incision one finger breath inferior to the mid-segment of the left clavicle.  I dissected down to the pectoralis major.  I bluntly separated the fibers.  I dissected the underlying fascia with electrocautery.  I then identified a venous branch which lead to the subclavian vein.  I dissected this vein free and then mobilized the vein superiorly.  I dissected out the adjacent left axillary artery.  I dissected out the artery for ~5 cm to obtain a mobile segment.  I placed a vessel loop around the left axillary artery.  I packed this wound with a wet lap pad.  At this point, I revaluated the profunda artery.  Upon further dissection, I had some question if the profunda  artery visualized was already extending into the femoral bifurcation, thus potentially connecting the left groin to this profunda artery exposure.  At this point, sealed both the profunda artery exposure and axillary artery exposure with Tegraderm.    At this point, I made an incision over the prior left femoral artery  exposure.  With great difficulty, I dissected out the left femorofemoral anastomosis and the native common femoral artery, profunda femoral artery, superficial femoral, and left external iliac artery.  In this process, I found the patient had encapsulated the left limb of this graft.  Upon opening this capsule, frank pus was drained.  I took an aerobic culture of such.   Eventually, I fully dissected this left end of the femorofemoral graft and the native artery, excising scar tissue and infected capsule in the process.  This wound exposure did not appear to extend into the profunda femoral artery artery exposure, so I felt it was ok to proceed with the axilloprofunda bypass.  At this point, Dr. Scot Dock scrubbed in to complete that bypass.  A co-surgeon technique was utilized to minimize ischemia time in this left leg.  See Dr. Nicole Cella dictation for details on that bypass.  Meanwhile, I turned my attention to the right groin.  I made an incision over the prior femoral exposure.  I dissected out the graft down to the right common femoral artery.  With great difficulty, I dissected out the profunda femoral artery, superficial femoral artery, and the dacron patched right external iliac artery.  I tried to dissect out the proximal external iliac artery but in this process, the retroperitoneum was densely adherent to the patch.  I had concerns with dissection that I had entered into the peritoneum due protrusion of fat and some serous fluid, so I repaired the rent with a running stitch of 3-0 Vicryl.  No further fluid or fat was protruding at this point.  I felt that leaving this dacron patch on the right external iliac artery was going to be necessary as I could not safely dissect out the segment that adherent to the iliac tunnel.    By this point, Dr. Scot Dock had given 4000 units of Heparin intravenously.  In total, 6000 units of Heparin had been given to obtain and maintain anticoagulation.  I clamped the  right external iliac artery, distal common femoral artery, and femorofemoral graft.  I sharply transected the graft.  I cut the residual graft down the barrel.  I then sharply transected the suture line.  I then sharply removed all of the graft off this artery.  This left a Dacron patch proximally on the iliofemoral artery.  I identified loose suture ends from the Dacron patch.  I obtained a bovine pericardial patch.  I sewed the patch to the common femoral artery with a running stitch of 5-0 Prolene.  In this process, I tied securing stitches to the old sutures on both sides of the Dacron patch with 6-0 Prolene.  These sutures were tied to the 5-0 Prolene as it was sewed around the common femoral artery.  I sharply fashioned the proximal end of the patch to meet the geometry of the Dacron patch.  I finished the anastomosis by carrying it across the two patches.  I backbled all arteries prior to completing the patch angioplasty.  There was no thrombus backbled from either end.  I released all clamps and immediately there was a pulse in this right common femoral artery/  At this point, Dr. Scot Dock had  completed the left axilloprofunda bypass and closed the incisions.  All incisions were reinforced with Dermabond and then covered with sterile bandages.  I then removed the tegraderm dressing off the left groin.  I bluntly dissected the femorofemoral graft and pulled it out the tunnel.  I then sharply transected the graft 5 cm distal to the left arterial anastomosis.  There was a pulse in the left common femoral artery artery at this point.  I cut off a segment of the infected graft and passed it off as a culture specimen.  I then completed my dissection of the capsule surround the graft.  I finished dissecting out the left external iliac artery.  I clamped the left external iliac artery and distal common femoral artery.  I cut down the barrel of the graft and then transected the visualized suture line.  I sharply  removed the graft off the left common femoral artery.  I fashioned a bovine patch for the geometry of this left common femoral artery arteriotomy.  I sewed the patch in place with a running stitch of 5-0 Prolene.  Prior to completing this patch angioplasty, I backbled the profunda femoral artery and external iliac artery.  No thrombus was present.  I released all clamped and there was resumption of a pulse in this artery.    At this point, I washed out each groin with 1.5 L of normal saline with a Pulsavac.  I then packed both groins with thrombin and gelfoam.  Distally, there was dopplerable right anterior tibial artery signal on the right.  I could not find any tibial signals though there was a popliteal signal present.  I felt that evaluation of the tibial arteries was going to be necessary.  I cannulated the left bovine patch on the left common femoral artery with a micropuncture needle.  I passed a microwire into the left profunda femoral artery.  The needle was exchanged for a microsheath.  I connected the sheath to the IV tubing.  I did hand injection to image the left leg.  This demonstrate intact profunda femoral artery collaterals that reconstitute the at-the-knee popliteal artery.  There was evidence of runoff via a left anterior tibial artery.  On the distal tibial injection, only vasospasm was evident.  I felt no further intervention was necessary.  I expect this vasospasm to resolve once she warm up.    At this point, the patient was given 30 mg of Protamine.  Dr. Scot Dock repaired the right groin with a layer of 2-0 Vicryl and a layer of 3-0 Vicryl.  The right groin incision was left open due to the previous infection.  This incision was packed with a wet-to-dry dressing.  I then sharply fenestrated a Penrose drain and placed the drain into the femorofemoral tunnel.  I pull the external segment of the drain out the left groin wound.  I reapproximated the left groin incisions with a double layer of  2-0 Vicryl and a single layer of 3-0 Vicryl.  I packed this groin with a wet-to-dry dressing.  I then wrapped the Penrose drain in a dry sterile dressing.  Sterile dressing were applied to both groin and affixed with tape.  The lateral chest incision was dressed with sterile dressing and affixed with a tegraderm.   COMPLICATIONS: none  CONDITION: stable   Adele Barthel, MD [facility] [place] Office: [phone] Pager: [phone]  12/31/2014, 8:36 PM

## 2014-12-31 NOTE — Anesthesia Procedure Notes (Signed)
Procedure Name: Intubation Date/Time: 12/31/2014 2:27 PM Performed by: Merdis Delay Pre-anesthesia Checklist: Patient identified, Patient being monitored, Emergency Drugs available, Timeout performed and Suction available Patient Re-evaluated:Patient Re-evaluated prior to inductionOxygen Delivery Method: Circle system utilized Preoxygenation: Pre-oxygenation with 100% oxygen Intubation Type: IV induction Ventilation: Mask ventilation without difficulty and Oral airway inserted - appropriate to patient size Laryngoscope Size: Mac and 3 Grade View: Grade I Tube type: Oral Tube size: 7.0 mm Number of attempts: 1 Airway Equipment and Method: Stylet Placement Confirmation: ETT inserted through vocal cords under direct vision,  breath sounds checked- equal and bilateral,  positive ETCO2 and CO2 detector Secured at: 22 cm Tube secured with: Tape Dental Injury: Teeth and Oropharynx as per pre-operative assessment

## 2014-12-31 NOTE — Transfer of Care (Signed)
Immediate Anesthesia Transfer of Care Note  Patient: Cynthia Bowen  Procedure(s) Performed: Procedure(s): REMOVAL INFECTED RIGHT FEMORAL ARTERY TO LEFT FEMORAL ARTERY BYPASS GRAFT (Bilateral) LEFT  AXILLA RY ARTERY TO LEFT FEMORAL ARTERY BYPASS GRAFT (Left) RIGHT FEMORAL ARTERY PATCH ANGIOPLASTY USING XENOSURE BIOLOGIC PATCH (Right) INTRA OPERATIVE ARTERIOGRAM (Left)  Patient Location: PACU  Anesthesia Type:General  Level of Consciousness: awake and patient cooperative  Airway & Oxygen Therapy: Patient Spontanous Breathing and Patient connected to nasal cannula oxygen  Post-op Assessment: Report given to RN, Post -op Vital signs reviewed and stable, Patient moving all extremities, Patient moving all extremities X 4 and Patient able to stick tongue midline  Post vital signs: reviewed and stable   Last Vitals:  Filed Vitals:   12/31/14 0511  BP: 136/67  Pulse: 58  Temp: 36.8 C  Resp: 18    Complications: No apparent anesthesia complications

## 2014-12-31 NOTE — Anesthesia Preprocedure Evaluation (Addendum)
Anesthesia Evaluation  Patient identified by MRN, date of birth, ID band Patient awake    Reviewed: Allergy & Precautions, H&P , NPO status , Patient's Chart, lab work & pertinent test results  Airway Mallampati: II  TM Distance: >3 FB Neck ROM: Full    Dental no notable dental hx. (+) Dental Advidsory Given, Edentulous Upper, Missing, Poor Dentition, Upper Dentures   Pulmonary pneumonia, resolved, COPD, former smoker,    Pulmonary exam normal breath sounds clear to auscultation       Cardiovascular + Peripheral Vascular Disease  Normal cardiovascular exam+ Valvular Problems/Murmurs  Rhythm:Regular Rate:Normal     Neuro/Psych  Headaches, PSYCHIATRIC DISORDERS  Neuromuscular disease negative neurological ROS  negative psych ROS   GI/Hepatic negative GI ROS, Neg liver ROS, GERD  Medicated and Controlled,  Endo/Other  negative endocrine ROS  Renal/GU      Musculoskeletal   Abdominal   Peds  Hematology  (+) anemia ,   Anesthesia Other Findings   Reproductive/Obstetrics negative OB ROS                            Anesthesia Physical Anesthesia Plan  ASA: III  Anesthesia Plan: General ETT   Post-op Pain Management:    Induction: Intravenous  Airway Management Planned: Oral ETT  Additional Equipment:   Intra-op Plan:   Post-operative Plan: Extubation in OR  Informed Consent: I have reviewed the patients History and Physical, chart, labs and discussed the procedure including the risks, benefits and alternatives for the proposed anesthesia with the patient or authorized representative who has indicated his/her understanding and acceptance.   Dental advisory given  Plan Discussed with: CRNA and Surgeon  Anesthesia Plan Comments:         Anesthesia Quick Evaluation

## 2014-12-31 NOTE — Progress Notes (Addendum)
Daily Progress Note  Assessment/Planning: POD #1 s/p I&D B groin abscess, L leg runoff   Abscesses extend to graft bilaterally.  Fem-fem BPG likely infected in this patient.  Appreciate ID assistance with abx regimen  Wound c&s pending  Patient will need: Excision of femorofemoral bypass, bovine patch angioplasty of bilateral common femoral artery, and reconstruction via left axilloprofunda vs axillopopliteal bypass. The risk, benefits, and alternative for bypass operations were discussed with the patient.   The patient is aware the risks include but are not limited to: bleeding, infection, myocardial infarction, stroke, limb loss, nerve damage, need for additional procedures in the future, wound complications, and inability to complete the bypass.   I have discussed with the patient that she is at very high risk of needing an amputation if the reconstruction cannot be done. 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 The patient is aware of these risks and agreed to proceed. We discussed starting TPN postop to maximizing her healing potential.   Subjective  - 1 Day Post-Op  No appetitie  Objective Filed Vitals:   12/30/14 1535 12/30/14 1959 12/31/14 0511 12/31/14 0720  BP: 139/67 139/64 136/67   Pulse: 62 53 58   Temp: 98.1 F (36.7 C) 97.4 F (36.3 C) 98.3 F (36.8 C)   TempSrc: Oral Oral Oral   Resp: 18 18 18    Height:    5\' 5"  (1.651 m)  Weight:    85 lb 15.7 oz (39 kg)  SpO2:  97% 100%     Intake/Output Summary (Last 24 hours) at 12/31/14 0759 Last data filed at 12/31/14 0009  Gross per 24 hour  Intake    600 ml  Output    905 ml  Net   -305 ml    PULM  CTAB CV  RRR GI  soft, NTND VASC  No bleeding from R VAC or L bandaged I&D site  Laboratory CBC    Component Value Date/Time   WBC 5.2 12/31/2014 0510   WBC 6.3 10/01/2014 1434   WBC 8.2 08/26/2012 1300   HGB 11.8* 12/31/2014 0510   HGB 10.6* 10/01/2014 1434   HGB 10.6* 08/26/2012 1300   HCT 35.0* 12/31/2014 0510   HCT 32.8* 10/01/2014 1434   HCT 31.2* 08/26/2012 1300   PLT 258 12/31/2014 0510   PLT 516* 08/26/2012 1300    BMET    Component Value Date/Time   NA 138 12/31/2014 0510   NA 138 08/26/2012 1300   K 3.8 12/31/2014 0510   K 4.0 08/26/2012 1300   CL 104 12/31/2014 0510   CL 108* 08/26/2012 1300   CO2 26 12/31/2014 0510   CO2 24 08/26/2012 1300   GLUCOSE 98 12/31/2014 0510   GLUCOSE 83 08/26/2012 1300   BUN <5* 12/31/2014 0510   BUN 29* 08/26/2012 1300   BUN 4 08/19/2009   CREATININE 0.55 12/31/2014 0510   CREATININE 0.65 10/01/2014 1420   CREATININE 0.79 08/26/2012 1300   CALCIUM 8.8* 12/31/2014 0510   CALCIUM 9.9 08/26/2012 1300   GFRNONAA >60 12/31/2014 0510   GFRNONAA >89 07/16/2014 0948   GFRNONAA >60 08/26/2012 1300   GFRAA >60 12/31/2014 0510   GFRAA >89 07/16/2014 0948   GFRAA >60 08/26/2012 [place], MD [facility] [place] Office: [phone] Pager: [phone]  12/31/2014,  7:59 AM

## 2014-12-31 NOTE — Anesthesia Postprocedure Evaluation (Signed)
  Anesthesia Post-op Note  Patient: Cynthia Bowen  Procedure(s) Performed: Procedure(s) (LRB): WOUND EXPLORATION (Left) IRRIGATION AND DEBRIDEMENT of bilateral groin ABSCESSes (Bilateral) APPLICATION OF WOUND VAC to right groin (Right) INTRAOPERATIVE ANGIOGRAM OF LEFT LOWER EXTREMITY (Left)  Patient Location: PACU  Anesthesia Type: General  Level of Consciousness: awake and alert   Airway and Oxygen Therapy: Patient Spontanous Breathing  Post-op Pain: mild  Post-op Assessment: Post-op Vital signs reviewed, Patient's Cardiovascular Status Stable, Respiratory Function Stable, Patent Airway and No signs of Nausea or vomiting  Last Vitals:  Filed Vitals:   12/31/14 0511  BP: 136/67  Pulse: 58  Temp: 36.8 C  Resp: 18    Post-op Vital Signs: stable   Complications: No apparent anesthesia complications

## 2014-12-31 NOTE — Anesthesia Postprocedure Evaluation (Signed)
  Anesthesia Post-op Note  Patient: Cynthia Bowen  Procedure(s) Performed: Procedure(s): REMOVAL INFECTED RIGHT FEMORAL ARTERY TO LEFT FEMORAL ARTERY BYPASS GRAFT (Bilateral) LEFT  AXILLA RY ARTERY TO LEFT FEMORAL ARTERY BYPASS GRAFT (Left) RIGHT FEMORAL ARTERY PATCH ANGIOPLASTY USING XENOSURE BIOLOGIC PATCH (Right) INTRA OPERATIVE ARTERIOGRAM (Left)  Patient Location: PACU  Anesthesia Type:General  Level of Consciousness: awake and alert   Airway and Oxygen Therapy: Patient Spontanous Breathing  Post-op Pain: Controlled  Post-op Assessment: Post-op Vital signs reviewed, Patient's Cardiovascular Status Stable and Respiratory Function Stable  Post-op Vital Signs: Reviewed  Filed Vitals:   12/31/14 2135  BP: 145/75  Pulse: 79  Temp: 37.3 C  Resp: 10    Complications: No apparent anesthesia complications

## 2014-12-31 NOTE — Progress Notes (Signed)
INFECTIOUS DISEASE PROGRESS NOTE  ID: Cynthia Bowen is a 69 y.o. female with  Active Problems:   Groin pain   Groin abscess  Subjective: Feels poorly, can't explain exactly why. Just received analgesia.   Abtx:  Anti-infectives    Start     Dose/Rate Route Frequency Ordered Stop   12/30/14 2200  cefTAZidime (FORTAZ) 2 g in dextrose 5 % 50 mL IVPB     2 g 100 mL/hr over 30 Minutes Intravenous Every 12 hours 12/30/14 1619     12/30/14 2100  ceFAZolin (ANCEF) IVPB 1 g/50 mL premix  Status:  Discontinued     1 g 100 mL/hr over 30 Minutes Intravenous 3 times per day 12/30/14 1525 12/30/14 1619   12/30/14 1645  vancomycin (VANCOCIN) IVPB 1000 mg/200 mL premix     1,000 mg 200 mL/hr over 60 Minutes Intravenous Every 24 hours 12/30/14 1635     12/30/14 1100  cefUROXime (ZINACEF) 1.5 g in dextrose 5 % 50 mL IVPB     1.5 g 100 mL/hr over 30 Minutes Intravenous To Short Stay 12/30/14 0038 12/30/14 1258   12/29/14 2315  ceFAZolin (ANCEF) IVPB 1 g/50 mL premix     1 g 100 mL/hr over 30 Minutes Intravenous  Once 12/29/14 2307 12/30/14 0030      Medications:  Scheduled: . cefTAZidime (FORTAZ)  IV  2 g Intravenous Q12H  . clidinium-chlordiazePOXIDE  1 capsule Oral TID AC & HS  . dicyclomine  20 mg Oral TID AC & HS  . diphenhydrAMINE  25 mg Oral Q6H  . enoxaparin (LOVENOX) injection  30 mg Subcutaneous Q24H  . famotidine (PEPCID) IV  20 mg Intravenous Q12H  . famotidine  20 mg Oral BID  . feeding supplement  1 Container Oral TID BM  . ketotifen  1 drop Both Eyes BID  . pantoprazole  40 mg Oral Daily  . pregabalin  50 mg Oral Daily  . vancomycin  1,000 mg Intravenous Q24H  . Vitamin D (Ergocalciferol)  50,000 Units Oral Q7 days    Objective: Vital signs in last 24 hours: Temp:  [97.4 F (36.3 C)-98.3 F (36.8 C)] 98.3 F (36.8 C) (09/09 0511) Pulse Rate:  [53-83] 58 (09/09 0511) Resp:  [16-20] 18 (09/09 0511) BP: (136-161)/(62-79) 136/67 mmHg (09/09 0511) SpO2:  [97 %-100  %] 100 % (09/09 0511) Weight:  [39 kg (85 lb 15.7 oz)] 39 kg (85 lb 15.7 oz) (09/09 0720)   General appearance: alert, cooperative and no distress Resp: clear to auscultation bilaterally Cardio: regular rate and rhythm GI: normal findings: bowel sounds normal and soft, non-tender Extremities: BLE- no edema. warm.   Lab Results  Recent Labs  12/30/14 0123 12/30/14 1715 12/31/14 0510  WBC 5.9 8.5 5.2  HGB 13.2 12.2 11.8*  HCT 38.5 37.1 35.0*  NA 139  --  138  K 4.1  --  3.8  CL 106  --  104  CO2 24  --  26  BUN 8  --  <5*  CREATININE 0.64 0.58 0.55   Liver Panel  Recent Labs  12/30/14 0123 12/31/14 0510  PROT 7.2 6.6  ALBUMIN 3.4* 2.7*  AST 27 18  ALT 18 12*  ALKPHOS 102 84  BILITOT 0.4 0.4   Sedimentation Rate No results for input(s): ESRSEDRATE in the last 72 hours. C-Reactive Protein  Recent Labs  12/31/14 0620  CRP 0.8    Microbiology: Recent Results (from the past 240 hour(s))  Wound culture  Status: None (Preliminary result)   Collection Time: 12/29/14 11:40 PM  Result Value Ref Range Status   Specimen Description WOUND GROIN  Final   Special Requests NONE  Final   Gram Stain   Final    FEW WBC PRESENT,BOTH PMN AND MONONUCLEAR NO SQUAMOUS EPITHELIAL CELLS SEEN NO ORGANISMS SEEN Performed at [facility]    Culture   Final    Culture reincubated for better growth Performed at [facility]    Report Status PENDING  Incomplete  Surgical pcr screen     Status: Abnormal   Collection Time: 12/30/14  5:20 AM  Result Value Ref Range Status   MRSA, PCR NEGATIVE NEGATIVE Final   Staphylococcus aureus POSITIVE (A) NEGATIVE Final    Comment:        The Xpert SA Assay (FDA approved for NASAL specimens in patients over 68 years of age), is one component of a comprehensive surveillance program.  Test performance has been validated by [facility] for patients greater than or equal to 80 year old. It is not intended to diagnose  infection nor to guide or monitor treatment.   Wound culture     Status: None (Preliminary result)   Collection Time: 12/30/14  1:11 PM  Result Value Ref Range Status   Specimen Description WOUND RIGHT GROIN  Final   Special Requests PATIENT ON FOLLOWING ZENOCEF PART A  Final   Gram Stain   Final    ABUNDANT WBC PRESENT,BOTH PMN AND MONONUCLEAR NO SQUAMOUS EPITHELIAL CELLS SEEN NO ORGANISMS SEEN Performed at [facility]    Culture NO GROWTH Performed at [facility]   Final   Report Status PENDING  Incomplete  Wound culture     Status: None (Preliminary result)   Collection Time: 12/30/14  1:11 PM  Result Value Ref Range Status   Specimen Description WOUND LEFT GROIN  Final   Special Requests PATIENT ON FOLLOWING ZENOCEF PART B  Final   Gram Stain   Final    ABUNDANT WBC PRESENT,BOTH PMN AND MONONUCLEAR NO SQUAMOUS EPITHELIAL CELLS SEEN NO ORGANISMS SEEN Performed at [facility]    Culture NO GROWTH Performed at [facility]   Final   Report Status PENDING  Incomplete    Studies/Results: No results found.   Assessment/Plan: PVD Infected Fem-Fem, abscess  Await Cynthia Bowen Cx  No change in Cynthia Bowen anbx for now.  For removal of the bypass with bovine patch.   Therapeutic Drug monitoring Cr is stable, will monitor Cynthia Bowen vanco levels with pharmacy  Total days of antibiotics: 2 vanco/ceftaz         Bobby Rumpf Infectious Diseases (pager) [phone] [email or web address] 12/31/2014, 9:10 AM  LOS: 1 day

## 2014-12-31 NOTE — Care Management Note (Signed)
Case Management Note Marvetta Gibbons RN, BSN Unit 2W-Case Manager [phone]  Patient Details  Name: Cynthia Bowen MRN: [id number] Date of Birth: 02-23-1946  Subjective/Objective:   Pt admitted with infected groin s/p I&D, may need BPG revision                 Action/Plan: PTA pt lived at home- per conversation with pt at bedside- pt states that she has all needed DME at home-  And is active with Arville Go for [facility] services- call made to [facility] with Arville Go and confirmed [facility] services of RN/PT- pt will need resumption order for [facility] at time of discharge. NCM to continue to follow for any further needs  Expected Discharge Date:                  Expected Discharge Plan:  [place]  In-House Referral:     Discharge planning Services  CM Consult  Post Acute Care Choice:  Resumption of Svcs/PTA Provider Choice offered to:  Patient  DME Arranged:  N/A DME Agency:  NA  HH Arranged:  RN, PT HH Agency:  [place]  Status of Service:  In process, will continue to follow  Medicare Important Message Given:    Date Medicare IM Given:    Medicare IM give by:    Date Additional Medicare IM Given:    Additional Medicare Important Message give by:     If discussed at [place] of Stay Meetings, dates discussed:    Additional Comments:  Dawayne Patricia, RN 12/31/2014, 11:24 AM

## 2014-12-31 NOTE — Clinical Documentation Improvement (Signed)
Vascular Surgery  Based on the clinical findings below, please document any associated diagnoses/conditions the patient has or may have.  Severe malnutrition, in context of chronic illness as documented by registered dietician  Other  Clinically Undetermined  Supporting Information: Registered Dietician documented "underweight, severe malnutrition in context of chronic illness"; height documented as 5 feet 3.5 inches, weight 85 lb 15.7 ounces, BMI 14.99.  Please document in your future progress notes and discharge summary if you agree with the assessment of severe malnutrition by the registered dietitian.      Please exercise your independent, professional judgment when responding. A specific answer is not anticipated or expected.  Thank you, Mateo Flow, RN [phone] Clinical Documentation Specialist

## 2014-12-31 NOTE — Interval H&P Note (Signed)
[facility] [place]  History and Physical Update  The patient was interviewed and re-examined.  The patient's previous History and Physical has been reviewed and is unchanged from my prior consult except for: interval bilateral groin exploration.  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  She will need excision of the right to leg femorofemoral graft, bovine patch angioplasty bilateral femoral arteries, and possible revascularization of left leg with axilloprofunda vs. Axillopopliteal bypass.  I discussed with the patient the nature of angiographic procedures, especially the limited patencies of any endovascular intervention.  The patient is aware of that the risks of an angiographic procedure include but are not limited to: bleeding, infection, access site complications, renal failure, embolization, rupture of vessel, dissection, possible need for emergent surgical intervention, possible need for surgical procedures to treat the patient's pathology, anaphylactic reaction to contrast, and stroke and death.  The patient is aware of the risks and agrees to proceed.  The patient is aware she is at high risk of limb loss in this case.  Adele Barthel, MD [facility] [place] Office: [phone] Pager: [phone]  12/31/2014, 1:28 PM

## 2015-01-01 LAB — URINE CULTURE: CULTURE: NO GROWTH

## 2015-01-01 LAB — BASIC METABOLIC PANEL
ANION GAP: 7 (ref 5–15)
BUN: <5 mg/dL — ABNORMAL LOW (ref 6–20)
CO2: 28 mmol/L (ref 22–32)
Calcium: 8.1 mg/dL — ABNORMAL LOW (ref 8.9–10.3)
Chloride: 103 mmol/L (ref 101–111)
Creatinine, Ser: 0.63 mg/dL (ref 0.44–1.00)
GFR calc Af Amer: >60 mL/min (ref >60)
GFR calc non Af Amer: >60 mL/min (ref >60)
GLUCOSE: 115 mg/dL — AB (ref 65–99)
POTASSIUM: 3.4 mmol/L — AB (ref 3.5–5.1)
Sodium: 138 mmol/L (ref 135–145)

## 2015-01-01 LAB — CBC
HCT: 31.5 % — ABNORMAL LOW (ref 36.0–46.0)
HEMOGLOBIN: 10.7 g/dL — AB (ref 12.0–15.0)
MCH: 28.3 pg (ref 26.0–34.0)
MCHC: 34 g/dL (ref 30.0–36.0)
MCV: 83.3 fL (ref 78.0–100.0)
PLATELETS: 227 10*3/uL (ref 150–400)
RBC: 3.78 MIL/uL — AB (ref 3.87–5.11)
RDW: 16.7 % — ABNORMAL HIGH (ref 11.5–15.5)
WBC: 12.9 10*3/uL — AB (ref 4.0–10.5)

## 2015-01-01 LAB — WOUND CULTURE

## 2015-01-01 LAB — MRSA PCR SCREENING: MRSA by PCR: NEGATIVE

## 2015-01-01 MED ORDER — DIPHENHYDRAMINE HCL 50 MG/ML IJ SOLN
12.5000 mg | Freq: Four times a day (QID) | INTRAMUSCULAR | Status: DC | PRN
  Administered 2015-01-09: 12.5 mg via INTRAVENOUS
  Filled 2015-01-01: qty 1

## 2015-01-01 MED ORDER — DIPHENHYDRAMINE HCL 12.5 MG/5ML PO ELIX
12.5000 mg | ORAL_SOLUTION | Freq: Four times a day (QID) | ORAL | Status: DC | PRN
  Administered 2015-01-07 – 2015-01-10 (×2): 12.5 mg via ORAL
  Filled 2015-01-01 (×2): qty 10
  Filled 2015-01-01: qty 5
  Filled 2015-01-01: qty 10

## 2015-01-01 MED ORDER — POTASSIUM CHLORIDE 10 MEQ/100ML IV SOLN
10.0000 meq | INTRAVENOUS | Status: AC
  Administered 2015-01-01 (×4): 10 meq via INTRAVENOUS
  Filled 2015-01-01 (×2): qty 100

## 2015-01-01 MED ORDER — HYDROMORPHONE HCL 1 MG/ML IJ SOLN
0.5000 mg | INTRAMUSCULAR | Status: AC
  Administered 2015-01-01: 0.5 mg via INTRAVENOUS

## 2015-01-01 MED ORDER — HYDROMORPHONE HCL 1 MG/ML IJ SOLN
INTRAMUSCULAR | Status: AC
  Administered 2015-01-01: 0.5 mg
  Filled 2015-01-01: qty 1

## 2015-01-01 MED ORDER — HYDROMORPHONE 0.3 MG/ML IV SOLN
INTRAVENOUS | Status: DC
  Administered 2015-01-01: 10:00:00 via INTRAVENOUS
  Administered 2015-01-01: 1.99 mg via INTRAVENOUS
  Administered 2015-01-01: 0.4 mg via INTRAVENOUS
  Administered 2015-01-01: 0.9 mg via INTRAVENOUS
  Administered 2015-01-02: 0.6 mg via INTRAVENOUS
  Administered 2015-01-02: 0.999 mg via INTRAVENOUS
  Administered 2015-01-02: 1.06 mg via INTRAVENOUS
  Administered 2015-01-02: 1.39 mg via INTRAVENOUS
  Administered 2015-01-02: 09:00:00 via INTRAVENOUS
  Administered 2015-01-02: 1.59 mg via INTRAVENOUS
  Administered 2015-01-02: 1.999 mg via INTRAVENOUS
  Administered 2015-01-03: 1.39 mg via INTRAVENOUS
  Administered 2015-01-03: 10:00:00 via INTRAVENOUS
  Administered 2015-01-03: 2.99 mg via INTRAVENOUS
  Administered 2015-01-03: 0.6 mg via INTRAVENOUS
  Administered 2015-01-03: 0.399 mg via INTRAVENOUS
  Administered 2015-01-03: 0.799 mg via INTRAVENOUS
  Administered 2015-01-04: 22:00:00 via INTRAVENOUS
  Administered 2015-01-04: 0.4 mg via INTRAVENOUS
  Administered 2015-01-04: 1.99 mg via INTRAVENOUS
  Administered 2015-01-04: 3.59 mg via INTRAVENOUS
  Administered 2015-01-04: 2.55 mg via INTRAVENOUS
  Administered 2015-01-05: 12:00:00 via INTRAVENOUS
  Administered 2015-01-05: 0.67 mL via INTRAVENOUS
  Administered 2015-01-05: 2.19 mg via INTRAVENOUS
  Administered 2015-01-06: 1.99 mg via INTRAVENOUS
  Administered 2015-01-06 (×2): via INTRAVENOUS
  Administered 2015-01-06: 1.19 mg via INTRAVENOUS
  Administered 2015-01-06: 3.79 mg via INTRAVENOUS
  Administered 2015-01-07: 0.7 mg via INTRAVENOUS
  Administered 2015-01-07: 0.3 mg via INTRAVENOUS
  Administered 2015-01-07: 2.79 mg via INTRAVENOUS
  Administered 2015-01-07: 1.39 mg via INTRAVENOUS
  Administered 2015-01-07: 3.59 mg via INTRAVENOUS
  Administered 2015-01-07: 23:00:00 via INTRAVENOUS
  Administered 2015-01-08: 0.799 mg via INTRAVENOUS
  Administered 2015-01-08: 3.79 mg via INTRAVENOUS
  Administered 2015-01-08: 0.799 mg via INTRAVENOUS
  Administered 2015-01-09 (×2): 3.3 mg via INTRAVENOUS
  Administered 2015-01-09: 10:00:00 via INTRAVENOUS
  Administered 2015-01-10: 2.99 mg via INTRAVENOUS
  Administered 2015-01-10: via INTRAVENOUS
  Filled 2015-01-01 (×3): qty 25
  Filled 2015-01-01: qty 625
  Filled 2015-01-01: qty 25
  Filled 2015-01-01 (×2): qty 625
  Filled 2015-01-01: qty 25
  Filled 2015-01-01 (×3): qty 625
  Filled 2015-01-01: qty 25
  Filled 2015-01-01: qty 625
  Filled 2015-01-01: qty 25

## 2015-01-01 MED ORDER — NALOXONE HCL 0.4 MG/ML IJ SOLN: 0.4000 mg | INTRAMUSCULAR | Status: DC | PRN

## 2015-01-01 MED ORDER — SODIUM CHLORIDE 0.9 % IJ SOLN: 9.0000 mL | INTRAMUSCULAR | Status: DC | PRN

## 2015-01-01 MED ORDER — MEGESTROL ACETATE 40 MG/ML PO SUSP
800.0000 mg | Freq: Every day | ORAL | Status: DC
  Administered 2015-01-01 – 2015-01-12 (×12): 800 mg via ORAL
  Filled 2015-01-01 (×12): qty 20

## 2015-01-01 NOTE — Progress Notes (Addendum)
Progress Note    01/01/2015 7:59 AM 1 Day Post-Op  Subjective:  C/o soreness  Tm 99.1 now afebrile HR  70's-80's NSR 696'V-893'Y systolic 10% 1BP1WC  Filed Vitals:   01/01/15 0700  BP:   Pulse:   Temp: 98.1 F (36.7 C)  Resp:     Physical Exam: Cardiac:  regular Lungs:  Non labored Incisions:  Left chest and left lateral thigh incisions are covered and bandages clean.  Wound beds in bilateral groins are clean Extremities:  Audible DP doppler signals bilaterally; audible ax/fem bypass graft signal Abdomen:  Soft, NT  CBC    Component Value Date/Time   WBC 12.9* 01/01/2015 0241   WBC 6.3 10/01/2014 1434   WBC 8.2 08/26/2012 1300   RBC 3.78* 01/01/2015 0241   RBC 3.75* 10/01/2014 1434   RBC 3.73* 08/26/2012 1300   RBC 2.75* 08/06/2012 0800   HGB 10.7* 01/01/2015 0241   HGB 10.6* 10/01/2014 1434   HGB 10.6* 08/26/2012 1300   HCT 31.5* 01/01/2015 0241   HCT 32.8* 10/01/2014 1434   HCT 31.2* 08/26/2012 1300   PLT 227 01/01/2015 0241   PLT 516* 08/26/2012 1300   MCV 83.3 01/01/2015 0241   MCV 87.6 10/01/2014 1434   MCV 84 08/26/2012 1300   MCH 28.3 01/01/2015 0241   MCH 28.4 10/01/2014 1434   MCH 28.3 08/26/2012 1300   MCHC 34.0 01/01/2015 0241   MCHC 32.4 10/01/2014 1434   MCHC 33.9 08/26/2012 1300   RDW 16.7* 01/01/2015 0241   RDW 17.1* 08/26/2012 1300   LYMPHSABS 2.0 11/17/2014 1621   LYMPHSABS 1.4 08/26/2012 1300   MONOABS 0.4 11/17/2014 1621   MONOABS 0.3 08/26/2012 1300   EOSABS 0.0 11/17/2014 1621   EOSABS 0.1 08/26/2012 1300   BASOSABS 0.0 11/17/2014 1621   BASOSABS 0.0 08/26/2012 1300    BMET    Component Value Date/Time   NA 138 01/01/2015 0241   NA 138 08/26/2012 1300   K 3.4* 01/01/2015 0241   K 4.0 08/26/2012 1300   CL 103 01/01/2015 0241   CL 108* 08/26/2012 1300   CO2 28 01/01/2015 0241   CO2 24 08/26/2012 1300   GLUCOSE 115* 01/01/2015 0241   GLUCOSE 83 08/26/2012 1300   BUN <5* 01/01/2015 0241   BUN 29* 08/26/2012 1300   BUN 4 08/19/2009   CREATININE 0.63 01/01/2015 0241   CREATININE 0.65 10/01/2014 1420   CREATININE 0.79 08/26/2012 1300   CALCIUM 8.1* 01/01/2015 0241   CALCIUM 9.9 08/26/2012 1300   GFRNONAA >60 01/01/2015 0241   GFRNONAA >89 07/16/2014 0948   GFRNONAA >60 08/26/2012 1300   GFRAA >60 01/01/2015 0241   GFRAA >89 07/16/2014 0948   GFRAA >60 08/26/2012 1300    INR    Component Value Date/Time   INR 1.01 12/29/2014 2334     Intake/Output Summary (Last 24 hours) at 01/01/15 0759 Last data filed at 01/01/15 0700  Gross per 24 hour  Intake 4664.17 ml  Output   3275 ml  Net 1389.17 ml     Assessment:  69 y.o. female is s/p:  left axilla deep femoral artery bypass with 8 mm PTFE graft and  1. Extended repeat bilateral femoral artery exposure 2. Excision of infected femorofemoral graft 3. Bovine patch angioplasty of bilateral common femoral artery  4. Left leg angiogram 1 Day Post-Op  Plan: -pt with audible doppler signals in bilateral DP's as well as ax/fem bypass -intraoperative wound cultures taken -bilateral wound beds are clean-will most likely  place wound vac on left groin on Monday -leukocytosis-continue IV ABx per ID -DVT prophylaxis:  Lovenox -give one time dose of dilaudid now-will start dilaudid PCA -transfer to [place], [place] [facility] [phone] 01/01/2015 7:59 AM   Addendum  I have independently interviewed and examined the patient, and I agree with the physician assistant's findings.  Appreciate ID's input.  I suspect long-term abx will be needed.  There is some residual dacron in the R iliofemoral artery that cannot safely be removed as the retroperitoneum is densely adherent.  Additionally, the aortic stent in place is a covered (PTFE) stent so that might be contaminated if blood cultures come back positive.  Fortunately, she had an adequate target in the L PFA for the axilloprofunda, so the L leg could be  salvaged.  This also is graft so vulnerable for infection.  Will need to start anticoagulation during this admission as a patency adjunct for the axilloprofunda bypass.  This bypass has to last a minimum of 1 year before any consideration of a redo femorofemoral bypass with autologous tissue.  Adele Barthel, MD [facility] [place] Office: [phone] Pager: [phone]  01/01/2015, 9:38 AM

## 2015-01-01 NOTE — Progress Notes (Signed)
INFECTIOUS DISEASE PROGRESS NOTE  ID: Cynthia Bowen is a 69 y.o. female with  Active Problems:   Groin pain   Groin abscess  Subjective: C/o swelling in her wounds.   Abtx:  Anti-infectives    Start     Dose/Rate Route Frequency Ordered Stop   12/31/14 2300  cefUROXime (ZINACEF) 1.5 g in dextrose 5 % 50 mL IVPB     1.5 g 100 mL/hr over 30 Minutes Intravenous  Once 12/31/14 2251 12/31/14 2337   12/31/14 2215  cefUROXime (ZINACEF) 1.5 g in dextrose 5 % 50 mL IVPB  Status:  Discontinued     1.5 g 100 mL/hr over 30 Minutes Intravenous Every 12 hours 12/31/14 2214 12/31/14 2246   12/31/14 1409  dextrose 5 % with cefUROXime (ZINACEF) ADS Med    Comments:  Forte, Lindsi   : cabinet override      12/31/14 1409 01/01/15 0214   12/30/14 2200  cefTAZidime (FORTAZ) 2 g in dextrose 5 % 50 mL IVPB     2 g 100 mL/hr over 30 Minutes Intravenous Every 12 hours 12/30/14 1619     12/30/14 2100  ceFAZolin (ANCEF) IVPB 1 g/50 mL premix  Status:  Discontinued     1 g 100 mL/hr over 30 Minutes Intravenous 3 times per day 12/30/14 1525 12/30/14 1619   12/30/14 1645  vancomycin (VANCOCIN) IVPB 1000 mg/200 mL premix     1,000 mg 200 mL/hr over 60 Minutes Intravenous Every 24 hours 12/30/14 1635     12/30/14 1100  cefUROXime (ZINACEF) 1.5 g in dextrose 5 % 50 mL IVPB     1.5 g 100 mL/hr over 30 Minutes Intravenous To Short Stay 12/30/14 0038 12/30/14 1258   12/29/14 2315  ceFAZolin (ANCEF) IVPB 1 g/50 mL premix     1 g 100 mL/hr over 30 Minutes Intravenous  Once 12/29/14 2307 12/30/14 0030      Medications:  Scheduled: . cefTAZidime (FORTAZ)  IV  2 g Intravenous Q12H  . clidinium-chlordiazePOXIDE  1 capsule Oral TID AC & HS  . dicyclomine  20 mg Oral TID AC & HS  . enoxaparin (LOVENOX) injection  30 mg Subcutaneous Q24H  . feeding supplement  1 Container Oral TID BM  . HYDROmorphone PCA 0.3 mg/mL   Intravenous 6 times per day  . ketotifen  1 drop Both Eyes BID  . pantoprazole  40 mg Oral  Daily  . pregabalin  50 mg Oral Daily  . vancomycin  1,000 mg Intravenous Q24H  . Vitamin D (Ergocalciferol)  50,000 Units Oral Q7 days    Objective: Vital signs in last 24 hours: Temp:  [97.9 F (36.6 C)-99.1 F (37.3 C)] 98.9 F (37.2 C) (09/10 1100) Pulse Rate:  [66-100] 82 (09/10 1200) Resp:  [8-22] 16 (09/10 1200) BP: (106-187)/(60-89) 115/66 mmHg (09/10 1200) SpO2:  [94 %-100 %] 96 % (09/10 1200)   General appearance: alert, cooperative and no distress Resp: clear to auscultation bilaterally Cardio: regular rate and rhythm GI: normal findings: bowel sounds normal and soft, non-tender Extremities: R groin wound dressed.   Lab Results  Recent Labs  12/31/14 0510 01/01/15 0241  WBC 5.2 12.9*  HGB 11.8* 10.7*  HCT 35.0* 31.5*  NA 138 138  K 3.8 3.4*  CL 104 103  CO2 26 28  BUN <5* <5*  CREATININE 0.55 0.63   Liver Panel  Recent Labs  12/30/14 0123 12/31/14 0510  PROT 7.2 6.6  ALBUMIN 3.4* 2.7*  AST  27 18  ALT 18 12*  ALKPHOS 102 84  BILITOT 0.4 0.4   Sedimentation Rate No results for input(s): ESRSEDRATE in the last 72 hours. C-Reactive Protein  Recent Labs  12/31/14 0620  CRP 0.8    Microbiology: Recent Results (from the past 240 hour(s))  Wound culture     Status: None   Collection Time: 12/29/14 11:40 PM  Result Value Ref Range Status   Specimen Description WOUND GROIN  Final   Special Requests NONE  Final   Gram Stain   Final    FEW WBC PRESENT,BOTH PMN AND MONONUCLEAR NO SQUAMOUS EPITHELIAL CELLS SEEN NO ORGANISMS SEEN Performed at [facility]    Culture   Final    MULTIPLE ORGANISMS PRESENT, NONE PREDOMINANT Note: NO STAPHYLOCOCCUS AUREUS ISOLATED NO GROUP A STREP (S.PYOGENES) ISOLATED Performed at [facility]    Report Status 01/01/2015 FINAL  Final  Surgical pcr screen     Status: Abnormal   Collection Time: 12/30/14  5:20 AM  Result Value Ref Range Status   MRSA, PCR NEGATIVE NEGATIVE Final    Staphylococcus aureus POSITIVE (A) NEGATIVE Final    Comment:        The Xpert SA Assay (FDA approved for NASAL specimens in patients over 75 years of age), is one component of a comprehensive surveillance program.  Test performance has been validated by [facility] for patients greater than or equal to 40 year old. It is not intended to diagnose infection nor to guide or monitor treatment.   Wound culture     Status: None (Preliminary result)   Collection Time: 12/30/14  1:11 PM  Result Value Ref Range Status   Specimen Description WOUND RIGHT GROIN  Final   Special Requests PATIENT ON FOLLOWING ZENOCEF PART A  Final   Gram Stain   Final    ABUNDANT WBC PRESENT,BOTH PMN AND MONONUCLEAR NO SQUAMOUS EPITHELIAL CELLS SEEN NO ORGANISMS SEEN Performed at [facility]    Culture   Final    NO GROWTH 1 DAY Performed at [facility]    Report Status PENDING  Incomplete  Wound culture     Status: None (Preliminary result)   Collection Time: 12/30/14  1:11 PM  Result Value Ref Range Status   Specimen Description WOUND LEFT GROIN  Final   Special Requests PATIENT ON FOLLOWING ZENOCEF PART B  Final   Gram Stain   Final    ABUNDANT WBC PRESENT,BOTH PMN AND MONONUCLEAR NO SQUAMOUS EPITHELIAL CELLS SEEN NO ORGANISMS SEEN Performed at [facility]    Culture   Final    NO GROWTH 1 DAY Performed at [facility]    Report Status PENDING  Incomplete  Culture, blood (single)     Status: None (Preliminary result)   Collection Time: 12/30/14  5:15 PM  Result Value Ref Range Status   Specimen Description BLOOD RIGHT HAND  Final   Special Requests IN PEDIATRIC BOTTLE 2CC  Final   Culture NO GROWTH 2 DAYS  Final   Report Status PENDING  Incomplete  Culture, blood (routine x 2)     Status: None (Preliminary result)   Collection Time: 12/30/14  7:16 PM  Result Value Ref Range Status   Specimen Description BLOOD LEFT ARM  Final   Special Requests BOTTLES  DRAWN AEROBIC AND ANAEROBIC 5CC   Final   Culture NO GROWTH 2 DAYS  Final   Report Status PENDING  Incomplete  Urine culture  Status: None   Collection Time: 12/31/14 12:17 AM  Result Value Ref Range Status   Specimen Description URINE, CLEAN CATCH  Final   Special Requests NONE  Final   Culture NO GROWTH 1 DAY  Final   Report Status 01/01/2015 FINAL  Final  Culture, routine-abscess     Status: None (Preliminary result)   Collection Time: 12/31/14  4:19 PM  Result Value Ref Range Status   Specimen Description ABSCESS LEFT GROIN  Final   Special Requests PATIENT ON FOLLOWING ANCEF  Final   Gram Stain   Final    MODERATE WBC PRESENT,BOTH PMN AND MONONUCLEAR NO SQUAMOUS EPITHELIAL CELLS SEEN NO ORGANISMS SEEN Performed at [facility]    Culture PENDING  Incomplete   Report Status PENDING  Incomplete  MRSA PCR Screening     Status: None   Collection Time: 12/31/14 10:32 PM  Result Value Ref Range Status   MRSA by PCR NEGATIVE NEGATIVE Final    Comment:        The GeneXpert MRSA Assay (FDA approved for NASAL specimens only), is one component of a comprehensive MRSA colonization surveillance program. It is not intended to diagnose MRSA infection nor to guide or monitor treatment for MRSA infections.     Studies/Results: Dg Tibia/fibula Left  12/31/2014   CLINICAL DATA:  Left lower extremity angiogram.  EXAM: DG C-ARM 61-120 MIN; LEFT TIBIA AND FIBULA - 2 VIEW  COMPARISON:  None.  FLUOROSCOPY TIME:  45 seconds.  FINDINGS: Two intraoperative fluoroscopic images were obtained of the left lower leg. These demonstrate no fracture or dislocation. Contrast filling of portions of the anterior and posterior tibial arteries are noted, as well as some filling of the distal portion of the peroneal artery.  IMPRESSION: See above.   Electronically Signed   By: Marijo Conception, M.D.   On: 12/31/2014 20:04   Dg C-arm 1-60 Min  12/31/2014   CLINICAL DATA:  Left lower extremity  angiogram.  EXAM: DG C-ARM 61-120 MIN; LEFT TIBIA AND FIBULA - 2 VIEW  COMPARISON:  None.  FLUOROSCOPY TIME:  45 seconds.  FINDINGS: Two intraoperative fluoroscopic images were obtained of the left lower leg. These demonstrate no fracture or dislocation. Contrast filling of portions of the anterior and posterior tibial arteries are noted, as well as some filling of the distal portion of the peroneal artery.  IMPRESSION: See above.   Electronically Signed   By: Marijo Conception, M.D.   On: 12/31/2014 20:04     Assessment/Plan: PVD Infected Fem-Fem, abscess  Await her Cx - initial was polymicrobial No change in her anbx for now.  Repeat debridement on 9-9, re-routing of arterial bypass graft.  Suspect her wbc is from surgeries.   Therapeutic Drug monitoring Cr is stable, will monitor her vanco levels with pharmacy  Total days of antibiotics: 3 vanco/ceftaz     available as needed on 9-11      [place] (pager) [phone] [email or web address] 01/01/2015, 12:46 PM  LOS: 2 days

## 2015-01-01 NOTE — Plan of Care (Signed)
Problem: Phase I Progression Outcomes Goal: Graft patent without s/s of occlusion Outcome: Progressing Able to dopple bilateral dorsalis pedis pulses, skin warm to touch.

## 2015-01-01 NOTE — Evaluation (Signed)
Physical Therapy Evaluation Patient Details Name: Cynthia Bowen MRN: [id number] DOB: 1945/11/22 Today's Date: 01/01/2015   History of Present Illness  Adm 9/7 with infected fem-fem BPG; 9/8 I& D bil groin with removal of graft; 9/9 underwent Lt axillary-femoral BPG  Clinical Impression  Patient is s/p above surgery resulting in functional limitations due to the deficits listed below (see PT Problem List). Pt currently limited by pain and demonstrated decr safety with her use of RW. Patient will benefit from skilled PT to increase their independence and safety with mobility to allow discharge to the venue listed below.       Follow Up Recommendations Home health PT;Supervision - Intermittent (pt wants to resume HHPT on d/c (goal to walk without RW))    Equipment Recommendations  None recommended by PT    Recommendations for Other Services       Precautions / Restrictions Precautions Precautions: Fall Restrictions Weight Bearing Restrictions: No      Mobility  Bed Mobility Overal bed mobility: Needs Assistance;+ 2 for safety/equipment Bed Mobility: Supine to Sit     Supine to sit: Mod assist;+2 for physical assistance;+2 for safety/equipment;HOB elevated     General bed mobility comments: due to abd pain, allowed HOB elevated and pivot to EOB with assist  Transfers Overall transfer level: Needs assistance Equipment used: Rolling walker (2 wheeled) Transfers: Sit to/from Stand Sit to Stand: Min guard;+2 safety/equipment         General transfer comment: vc for safe use RW;   Ambulation/Gait Ambulation/Gait assistance: Min assist;+2 safety/equipment Ambulation Distance (Feet): 45 Feet Assistive device: Rolling walker (2 wheeled) Gait Pattern/deviations: Step-through pattern;Decreased stride length;Shuffle;Antalgic Gait velocity: decreased Gait velocity interpretation: Below normal speed for age/gender General Gait Details: after 5 feet pt with partial buckling of  bil knees and quickly recovered to upright; stated she "had a pain"; close guarding by 2nd person and pt with 2 more episodes; chair brought to pt  Stairs            Wheelchair Mobility    Modified Rankin (Stroke Patients Only)       Balance           Standing balance support: Bilateral upper extremity supported Standing balance-Leahy Scale: Poor                               Pertinent Vitals/Pain Pain Assessment: 0-10 Pain Score: 3  Pain Location: abd Pain Descriptors / Indicators: Operative site guarding Pain Intervention(s): Limited activity within patient's tolerance;Monitored during session;Premedicated before session;Repositioned (deferred use of gait belt (pt stated we couldnt use also))    Home Living Family/patient expects to be discharged to:: Private residence Living Arrangements: Alone Available Help at Discharge: Family;Available PRN/intermittently Type of Home: Apartment Home Access: Stairs to enter Entrance Stairs-Rails: None Entrance Stairs-Number of Steps: 1 Home Layout: One level Home Equipment: Cane - single point;Walker - 2 wheels      Prior Function Level of Independence: Independent with assistive device(s)         Comments: mobilizing with RW; receiving HHPT     Hand Dominance        Extremity/Trunk Assessment   Upper Extremity Assessment: Overall WFL for tasks assessed           Lower Extremity Assessment: Generalized weakness (pain with hip flexion)      Cervical / Trunk Assessment: Normal  Communication   Communication: No difficulties  Cognition Arousal/Alertness:  Awake/alert Behavior During Therapy: WFL for tasks assessed/performed Overall Cognitive Status: Within Functional Limits for tasks assessed                      General Comments      Exercises General Exercises - Lower Extremity Ankle Circles/Pumps: AROM;10 reps;Both      Assessment/Plan    PT Assessment Patient needs  continued PT services  PT Diagnosis Difficulty walking;Acute pain   PT Problem List Decreased activity tolerance;Decreased balance;Decreased mobility;Decreased knowledge of use of DME;Pain  PT Treatment Interventions DME instruction;Gait training;Functional mobility training;Therapeutic activities;Therapeutic exercise;Balance training;Patient/family education   PT Goals (Current goals can be found in the Care Plan section) Acute Rehab PT Goals Patient Stated Goal: get out of here PT Goal Formulation: With patient Time For Goal Achievement: 01/08/15 Potential to Achieve Goals: Good    Frequency Min 3X/week   Barriers to discharge Decreased caregiver support      Co-evaluation               End of Session Equipment Utilized During Treatment:  (no belt due to abd pain/ax-fem BPG) Activity Tolerance: Patient limited by pain Patient left: in chair;with call bell/phone within reach Nurse Communication: Mobility status;Precautions         Time: 2542-7062 PT Time Calculation (min) (ACUTE ONLY): 18 min   Charges:   PT Evaluation $Initial PT Evaluation Tier I: 1 Procedure     PT G Codes:        Norina Cowper 2015/01/23, 11:15 AM Pager [phone]

## 2015-01-01 NOTE — Progress Notes (Signed)
K+= 3.4and creat= 0.63 w/ urine o/p > 30cc/hr; VVS KCL protocol initiated with 10 mEq KCL in 100cc IV x 4, each over one hour.

## 2015-01-02 LAB — WOUND CULTURE
CULTURE: NO GROWTH
Culture: NO GROWTH

## 2015-01-02 LAB — GLUCOSE, CAPILLARY: Glucose-Capillary: 89 mg/dL (ref 65–99)

## 2015-01-02 LAB — CBC
HCT: 27.2 % — ABNORMAL LOW (ref 36.0–46.0)
HEMOGLOBIN: 9 g/dL — AB (ref 12.0–15.0)
MCH: 27.4 pg (ref 26.0–34.0)
MCHC: 33.1 g/dL (ref 30.0–36.0)
MCV: 82.7 fL (ref 78.0–100.0)
PLATELETS: 172 10*3/uL (ref 150–400)
RBC: 3.29 MIL/uL — ABNORMAL LOW (ref 3.87–5.11)
RDW: 16.5 % — AB (ref 11.5–15.5)
WBC: 8.8 10*3/uL (ref 4.0–10.5)

## 2015-01-02 NOTE — Progress Notes (Signed)
UR COMPLETED  

## 2015-01-02 NOTE — Progress Notes (Signed)
ANTIBIOTIC CONSULT NOTE - Follow Up Consult  Pharmacy Consult for Vancomycin Indication: infected AV graft with abscess  Allergies  Allergen Reactions  . Avelox [Moxifloxacin Hcl In Nacl] Nausea And Vomiting  . Betadine [Povidone Iodine] Itching and Rash  . Alendronate Sodium Nausea And Vomiting and Other (See Comments)    dizziness  . Aspirin Nausea Only  . Codeine Nausea And Vomiting  . Doxycycline Nausea And Vomiting  . Fluconazole Nausea And Vomiting  . Hydrocodone Nausea And Vomiting    GI distress  . Hydrocodone-Acetaminophen Nausea And Vomiting  . Morphine And Related Nausea Only  . Neurontin [Gabapentin] Other (See Comments)    Mood changes   . Nsaids Other (See Comments)    Severe gastritis & perforation - avoid NSAIDs when possible  . Quinolones Hives and Itching  . Sertraline Hcl Nausea And Vomiting and Other (See Comments)    Hallucinations   . Sulfamethoxazole Hives and Itching  . Latex Rash  . Sulfa Antibiotics Rash    Patient Measurements: Height: 5\' 5"  (165.1 cm) Weight: 85 lb 15.7 oz (39 kg) IBW/kg (Calculated) : 57  Vital Signs: Temp: 98.3 F (36.8 C) (09/11 0717) Temp Source: Oral (09/11 0717) BP: 140/82 mmHg (09/11 0900) Pulse Rate: 95 (09/11 0900) Intake/Output from previous day: 09/10 0701 - 09/11 0700 In: 2780 [P.O.:1080; I.V.:1200; IV Piggyback:500] Out: 1005 [Urine:1005] Intake/Output from this shift: Total I/O In: 100 [I.V.:100] Out: -   Labs:  Recent Labs  12/30/14 1715 12/31/14 0510 01/01/15 0241 01/02/15 0228  WBC 8.5 5.2 12.9* 8.8  HGB 12.2 11.8* 10.7* 9.0*  PLT 279 258 227 172  CREATININE 0.58 0.55 0.63  --    Estimated Creatinine Clearance: 42 mL/min (by C-G formula based on Cr of 0.63).  Assessment: 68yof continues on vancomycin and ceftazidime for infected left groin graft/abscess s/p I&D 9/8 and excision of infected graft and placement of new graft on 9/9. Planning wound VAC tomorrow. Cultures remain negative.  Renal function has been stable.  9/8 Vancomycin>> 9/8 Ceftazidime per MD>> 9/7 groin wound>> negative 9/8 groin wound (on zinacef)>> negative 9/8 blood x2>> ngtd 9/9 urine>> negative 9/9 groin abscess (on cefazolin)>> ngtd 9/9 groin wound>> ngtd  Goal of Therapy:  Vancomycin trough level 15-20 mcg/ml  Plan:  1) Continue vancomycin 1g IV q24 - get trough soon 2) Continue ceftazidime 2g IV q12 per MD  Nena Jordan, PharmD, BCPS 01/02/2015,11:06 AM

## 2015-01-02 NOTE — Progress Notes (Signed)
   Daily Progress Note  Assessment/Planning: POD #2 s/p Excision of infected fem-fem BPG, L ax-PFA bypass   Continue abx.  Waiting wound c&S  PICC tomorrow with IR  No appetite despite Megace  Start TPN once PICC in place for severe protein malnutrition related to multiple intraabdominal surgeries caused by gastric perforation  VAC to L groin after Penrose removed tomorrow  Dry dressing to R groin at this point.  PT/OT c/s pending  Suspect she will need SNF placement   Subjective  - 2 Days Post-Op  Pain better controlled  Objective Filed Vitals:   01/02/15 0700 01/02/15 0717 01/02/15 0800 01/02/15 0900  BP: 122/60  122/66 140/82  Pulse: 74  72 95  Temp:  98.3 F (36.8 C)    TempSrc:  Oral    Resp: 15  16 14   Height:      Weight:      SpO2: 99%  98% 96%    Intake/Output Summary (Last 24 hours) at 01/02/15 1035 Last data filed at 01/02/15 0900  Gross per 24 hour  Intake   2360 ml  Output    785 ml  Net   1575 ml    PULM  CTAB, improved inspirations today CV  RRR GI  soft, NTND VASC  Dressing on ax and lateral incisions and L chest, R groin incision clean without smell or pus, L groin with some serous drainage, nothing draining from penrose drain, both feet viable with AT signals  Laboratory CBC    Component Value Date/Time   WBC 8.8 01/02/2015 0228   WBC 6.3 10/01/2014 1434   WBC 8.2 08/26/2012 1300   HGB 9.0* 01/02/2015 0228   HGB 10.6* 10/01/2014 1434   HGB 10.6* 08/26/2012 1300   HCT 27.2* 01/02/2015 0228   HCT 32.8* 10/01/2014 1434   HCT 31.2* 08/26/2012 1300   PLT 172 01/02/2015 0228   PLT 516* 08/26/2012 1300    BMET    Component Value Date/Time   NA 138 01/01/2015 0241   NA 138 08/26/2012 1300   K 3.4* 01/01/2015 0241   K 4.0 08/26/2012 1300   CL 103 01/01/2015 0241   CL 108* 08/26/2012 1300   CO2 28 01/01/2015 0241   CO2 24 08/26/2012 1300   GLUCOSE 115* 01/01/2015 0241   GLUCOSE 83 08/26/2012 1300   BUN <5* 01/01/2015 0241   BUN 29* 08/26/2012 1300   BUN 4 08/19/2009   CREATININE 0.63 01/01/2015 0241   CREATININE 0.65 10/01/2014 1420   CREATININE 0.79 08/26/2012 1300   CALCIUM 8.1* 01/01/2015 0241   CALCIUM 9.9 08/26/2012 1300   GFRNONAA >60 01/01/2015 0241   GFRNONAA >89 07/16/2014 0948   GFRNONAA >60 08/26/2012 1300   GFRAA >60 01/01/2015 0241   GFRAA >89 07/16/2014 0948   GFRAA >60 08/26/2012 [place], MD [facility] Office: [phone] Pager: [phone]  01/02/2015, 10:35 AM

## 2015-01-03 ENCOUNTER — Encounter (HOSPITAL_COMMUNITY): Payer: Medicare Other

## 2015-01-03 ENCOUNTER — Inpatient Hospital Stay (HOSPITAL_COMMUNITY): Payer: Medicare Other

## 2015-01-03 ENCOUNTER — Encounter (HOSPITAL_COMMUNITY): Payer: Self-pay | Admitting: Vascular Surgery

## 2015-01-03 LAB — CBC
HEMATOCRIT: 26.6 % — AB (ref 36.0–46.0)
HEMOGLOBIN: 8.8 g/dL — AB (ref 12.0–15.0)
MCH: 27.6 pg (ref 26.0–34.0)
MCHC: 33.1 g/dL (ref 30.0–36.0)
MCV: 83.4 fL (ref 78.0–100.0)
Platelets: 175 10*3/uL (ref 150–400)
RBC: 3.19 MIL/uL — AB (ref 3.87–5.11)
RDW: 16.7 % — ABNORMAL HIGH (ref 11.5–15.5)
WBC: 8.5 10*3/uL (ref 4.0–10.5)

## 2015-01-03 LAB — WOUND CULTURE: CULTURE: NO GROWTH

## 2015-01-03 LAB — GLUCOSE, CAPILLARY
GLUCOSE-CAPILLARY: 115 mg/dL — AB (ref 65–99)
GLUCOSE-CAPILLARY: 111 mg/dL — AB (ref 65–99)
Glucose-Capillary: 151 mg/dL — ABNORMAL HIGH (ref 65–99)
Glucose-Capillary: 112 mg/dL — ABNORMAL HIGH (ref 65–99)

## 2015-01-03 MED ORDER — TRACE MINERALS CR-CU-MN-SE-ZN 10-1000-500-60 MCG/ML IV SOLN
INTRAVENOUS | Status: AC
  Administered 2015-01-03: 18:00:00 via INTRAVENOUS
  Filled 2015-01-03: qty 720

## 2015-01-03 MED ORDER — LIDOCAINE HCL 1 % IJ SOLN
INTRAMUSCULAR | Status: AC
  Filled 2015-01-03: qty 20

## 2015-01-03 MED ORDER — FAT EMULSION 20 % IV EMUL
240.0000 mL | INTRAVENOUS | Status: AC
  Administered 2015-01-03: 240 mL via INTRAVENOUS
  Filled 2015-01-03: qty 250

## 2015-01-03 MED ORDER — ENOXAPARIN SODIUM 30 MG/0.3ML ~~LOC~~ SOLN
20.0000 mg | SUBCUTANEOUS | Status: DC
  Administered 2015-01-03 – 2015-01-04 (×2): 20 mg via SUBCUTANEOUS
  Filled 2015-01-03: qty 0.2
  Filled 2015-01-03: qty 0.3
  Filled 2015-01-03 (×2): qty 0.2

## 2015-01-03 MED ORDER — INSULIN ASPART 100 UNIT/ML ~~LOC~~ SOLN
0.0000 [IU] | SUBCUTANEOUS | Status: DC
  Administered 2015-01-03: 2 [IU] via SUBCUTANEOUS
  Administered 2015-01-04 (×2): 1 [IU] via SUBCUTANEOUS
  Administered 2015-01-05: 2 [IU] via SUBCUTANEOUS
  Administered 2015-01-06: 1 [IU] via SUBCUTANEOUS

## 2015-01-03 NOTE — Progress Notes (Signed)
INFECTIOUS DISEASE PROGRESS NOTE  ID: Cynthia Bowen is a 69 y.o. female with  Active Problems:   Groin pain   Groin abscess  Subjective: Without complaints.   Abtx:  Anti-infectives    Start     Dose/Rate Route Frequency Ordered Stop   12/31/14 2300  cefUROXime (ZINACEF) 1.5 g in dextrose 5 % 50 mL IVPB     1.5 g 100 mL/hr over 30 Minutes Intravenous  Once 12/31/14 2251 12/31/14 2337   12/31/14 2215  cefUROXime (ZINACEF) 1.5 g in dextrose 5 % 50 mL IVPB  Status:  Discontinued     1.5 g 100 mL/hr over 30 Minutes Intravenous Every 12 hours 12/31/14 2214 12/31/14 2246   12/31/14 1409  dextrose 5 % with cefUROXime (ZINACEF) ADS Med    Comments:  Forte, Lindsi   : cabinet override      12/31/14 1409 01/01/15 0214   12/30/14 2200  cefTAZidime (FORTAZ) 2 g in dextrose 5 % 50 mL IVPB     2 g 100 mL/hr over 30 Minutes Intravenous Every 12 hours 12/30/14 1619     12/30/14 2100  ceFAZolin (ANCEF) IVPB 1 g/50 mL premix  Status:  Discontinued     1 g 100 mL/hr over 30 Minutes Intravenous 3 times per day 12/30/14 1525 12/30/14 1619   12/30/14 1645  vancomycin (VANCOCIN) IVPB 1000 mg/200 mL premix     1,000 mg 200 mL/hr over 60 Minutes Intravenous Every 24 hours 12/30/14 1635     12/30/14 1100  cefUROXime (ZINACEF) 1.5 g in dextrose 5 % 50 mL IVPB     1.5 g 100 mL/hr over 30 Minutes Intravenous To Short Stay 12/30/14 0038 12/30/14 1258   12/29/14 2315  ceFAZolin (ANCEF) IVPB 1 g/50 mL premix     1 g 100 mL/hr over 30 Minutes Intravenous  Once 12/29/14 2307 12/30/14 0030      Medications:  Scheduled: . cefTAZidime (FORTAZ)  IV  2 g Intravenous Q12H  . clidinium-chlordiazePOXIDE  1 capsule Oral TID AC & HS  . dicyclomine  20 mg Oral TID AC & HS  . enoxaparin (LOVENOX) injection  20 mg Subcutaneous Q24H  . feeding supplement  1 Container Oral TID BM  . HYDROmorphone PCA 0.3 mg/mL   Intravenous 6 times per day  . insulin aspart  0-9 Units Subcutaneous 6 times per day  . ketotifen   1 drop Both Eyes BID  . megestrol  800 mg Oral Daily  . pantoprazole  40 mg Oral Daily  . pregabalin  50 mg Oral Daily  . vancomycin  1,000 mg Intravenous Q24H  . Vitamin D (Ergocalciferol)  50,000 Units Oral Q7 days    Objective: Vital signs in last 24 hours: Temp:  [97.4 F (36.3 C)-99.2 F (37.3 C)] 98.3 F (36.8 C) (09/12 0751) Pulse Rate:  [35-76] 66 (09/11 2352) Resp:  [12-21] 19 (09/12 0400) BP: (105-133)/(46-83) 133/69 mmHg (09/12 0400) SpO2:  [96 %-100 %] 100 % (09/12 0400)   General appearance: alert, cooperative and no distress Resp: clear to auscultation bilaterally Cardio: regular rate and rhythm GI: normal findings: bowel sounds normal and soft, non-tender Extremities: wounds are clean. no erythema, dressings clean.   Lab Results  Recent Labs  01/01/15 0241 01/02/15 0228 01/03/15 0250  WBC 12.9* 8.8 8.5  HGB 10.7* 9.0* 8.8*  HCT 31.5* 27.2* 26.6*  NA 138  --   --   K 3.4*  --   --   CL 103  --   --  CO2 28  --   --   BUN <5*  --   --   CREATININE 0.63  --   --    Liver Panel No results for input(s): PROT, ALBUMIN, AST, ALT, ALKPHOS, BILITOT, BILIDIR, IBILI in the last 72 hours. Sedimentation Rate No results for input(s): ESRSEDRATE in the last 72 hours. C-Reactive Protein No results for input(s): CRP in the last 72 hours.  Microbiology: Recent Results (from the past 240 hour(s))  Wound culture     Status: None   Collection Time: 12/29/14 11:40 PM  Result Value Ref Range Status   Specimen Description WOUND GROIN  Final   Special Requests NONE  Final   Gram Stain   Final    FEW WBC PRESENT,BOTH PMN AND MONONUCLEAR NO SQUAMOUS EPITHELIAL CELLS SEEN NO ORGANISMS SEEN Performed at [facility]    Culture   Final    MULTIPLE ORGANISMS PRESENT, NONE PREDOMINANT Note: NO STAPHYLOCOCCUS AUREUS ISOLATED NO GROUP A STREP (S.PYOGENES) ISOLATED Performed at [facility]    Report Status 01/01/2015 FINAL  Final  Surgical pcr screen      Status: Abnormal   Collection Time: 12/30/14  5:20 AM  Result Value Ref Range Status   MRSA, PCR NEGATIVE NEGATIVE Final   Staphylococcus aureus POSITIVE (A) NEGATIVE Final    Comment:        The Xpert SA Assay (FDA approved for NASAL specimens in patients over 83 years of age), is one component of a comprehensive surveillance program.  Test performance has been validated by [facility] for patients greater than or equal to 54 year old. It is not intended to diagnose infection nor to guide or monitor treatment.   Wound culture     Status: None   Collection Time: 12/30/14  1:11 PM  Result Value Ref Range Status   Specimen Description WOUND RIGHT GROIN  Final   Special Requests PATIENT ON FOLLOWING ZENOCEF PART A  Final   Gram Stain   Final    ABUNDANT WBC PRESENT,BOTH PMN AND MONONUCLEAR NO SQUAMOUS EPITHELIAL CELLS SEEN NO ORGANISMS SEEN Performed at [facility]    Culture   Final    NO GROWTH 2 DAYS Performed at [facility]    Report Status 01/02/2015 FINAL  Final  Wound culture     Status: None   Collection Time: 12/30/14  1:11 PM  Result Value Ref Range Status   Specimen Description WOUND LEFT GROIN  Final   Special Requests PATIENT ON FOLLOWING ZENOCEF PART B  Final   Gram Stain   Final    ABUNDANT WBC PRESENT,BOTH PMN AND MONONUCLEAR NO SQUAMOUS EPITHELIAL CELLS SEEN NO ORGANISMS SEEN Performed at [facility]    Culture   Final    NO GROWTH 2 DAYS Performed at [facility]    Report Status 01/02/2015 FINAL  Final  Culture, blood (single)     Status: None (Preliminary result)   Collection Time: 12/30/14  5:15 PM  Result Value Ref Range Status   Specimen Description BLOOD RIGHT HAND  Final   Special Requests IN PEDIATRIC BOTTLE 2CC  Final   Culture NO GROWTH 3 DAYS  Final   Report Status PENDING  Incomplete  Culture, blood (routine x 2)     Status: None (Preliminary result)   Collection Time: 12/30/14  7:16 PM    Result Value Ref Range Status   Specimen Description BLOOD LEFT ARM  Final   Special Requests  BOTTLES DRAWN AEROBIC AND ANAEROBIC 5CC   Final   Culture NO GROWTH 3 DAYS  Final   Report Status PENDING  Incomplete  Urine culture     Status: None   Collection Time: 12/31/14 12:17 AM  Result Value Ref Range Status   Specimen Description URINE, CLEAN CATCH  Final   Special Requests NONE  Final   Culture NO GROWTH 1 DAY  Final   Report Status 01/01/2015 FINAL  Final  Culture, routine-abscess     Status: None (Preliminary result)   Collection Time: 12/31/14  4:19 PM  Result Value Ref Range Status   Specimen Description ABSCESS LEFT GROIN  Final   Special Requests PATIENT ON FOLLOWING ANCEF  Final   Gram Stain   Final    MODERATE WBC PRESENT,BOTH PMN AND MONONUCLEAR NO SQUAMOUS EPITHELIAL CELLS SEEN NO ORGANISMS SEEN Performed at [facility]    Culture   Final    NO GROWTH 2 DAYS Performed at [facility]    Report Status PENDING  Incomplete  Wound culture     Status: None (Preliminary result)   Collection Time: 12/31/14  6:50 PM  Result Value Ref Range Status   Specimen Description WOUND  Final   Special Requests LEFT FEMORAL ARTERY BYPASS GRAFT  Final   Gram Stain PENDING  Incomplete   Culture   Final    NO GROWTH 2 DAYS Performed at [facility]    Report Status PENDING  Incomplete  MRSA PCR Screening     Status: None   Collection Time: 12/31/14 10:32 PM  Result Value Ref Range Status   MRSA by PCR NEGATIVE NEGATIVE Final    Comment:        The GeneXpert MRSA Assay (FDA approved for NASAL specimens only), is one component of a comprehensive MRSA colonization surveillance program. It is not intended to diagnose MRSA infection nor to guide or monitor treatment for MRSA infections.     Studies/Results: No results found.   Assessment/Plan: PVD Infected Fem-Fem, abscess  Her Cx are not revealing. Polymicrobial.  Will continue her  current anbx for 1 month.  Her WBC has improved.   Therapeutic Drug monitoring Cr is stable, will monitor her vanco levels with pharmacy  Total days of antibiotics: 5 vanco/ceftaz         Available as needed.    Bobby Rumpf Infectious Diseases (pager) [phone] [email or web address] 01/03/2015, 11:01 AM  LOS: 4 days

## 2015-01-03 NOTE — Procedures (Signed)
Successful placement of right upper extremity approach dual lumen PICC line with tip at the superior caval-atrial junction.   The PICC line is ready for immediate use.  Ronny Bacon, MD Pager #: [phone]

## 2015-01-03 NOTE — Progress Notes (Signed)
Physical Therapy Treatment Patient Details Name: Cynthia Bowen MRN: [id number] DOB: 04/05/1946 Today's Date: 01/03/2015    History of Present Illness Adm 9/7 with infected fem-fem BPG; 9/8 I& D bil groin with removal of graft; 9/9 underwent Lt axillary-femoral BPG    PT Comments    Progressing well, much more stable gait and able to significant vary speed.  [name] a bit and still needs the RW.  Follow Up Recommendations  Home health PT;Supervision - Intermittent     Equipment Recommendations  None recommended by PT    Recommendations for Other Services       Precautions / Restrictions Precautions Precautions: Fall    Mobility  Bed Mobility Overal bed mobility: Needs Assistance Bed Mobility: Supine to Sit     Supine to sit: Min assist (HOB flat)     General bed mobility comments: some struggle from flat bed  Transfers Overall transfer level: Needs assistance Equipment used: Rolling walker (2 wheeled) Transfers: Sit to/from Stand Sit to Stand: Min guard         General transfer comment: vc for hand placement  Ambulation/Gait Ambulation/Gait assistance: Min guard Ambulation Distance (Feet): 660 Feet Assistive device: Rolling walker (2 wheeled) Gait Pattern/deviations: Step-through pattern;Drifts right/left Gait velocity: can vary to age appropriate speed   General Gait Details: pt with much smoother gait, ability to vary speed significantly   Stairs            Wheelchair Mobility    Modified Rankin (Stroke Patients Only)       Balance   Sitting-balance support: No upper extremity supported Sitting balance-Leahy Scale: Normal       Standing balance-Leahy Scale: Fair                      Cognition Arousal/Alertness: Awake/alert Behavior During Therapy: WFL for tasks assessed/performed Overall Cognitive Status: Within Functional Limits for tasks assessed                      Exercises      General Comments        Pertinent Vitals/Pain Pain Assessment: Faces Faces Pain Scale: No hurt    Home Living                      Prior Function            PT Goals (current goals can now be found in the care plan section) Acute Rehab PT Goals PT Goal Formulation: With patient Time For Goal Achievement: 01/08/15 Potential to Achieve Goals: Good Progress towards PT goals: Progressing toward goals    Frequency  Min 3X/week    PT Plan Current plan remains appropriate    Co-evaluation             End of Session   Activity Tolerance: Patient tolerated treatment well Patient left: in chair;with call bell/phone within reach     Time: 3235-5732 PT Time Calculation (min) (ACUTE ONLY): 19 min  Charges:  $Gait Training: 8-22 mins                    G Codes:      Jillyn Stacey, Tessie Fass 01/03/2015, 4:52 PM 01/03/2015  Donnella Sham, PT [phone]  (pager)

## 2015-01-03 NOTE — Evaluation (Signed)
Occupational Therapy Evaluation Patient Details Name: Cynthia Bowen MRN: [id number] DOB: December 14, 1945 Today's Date: 01/03/2015    History of Present Illness Admitted 9/7 with infected fem-fem BPG; 9/8 I& D bil groin with removal of graft; 9/9 underwent Lt axillary-femoral BPG. Wound vac placed today.   Clinical Impression   Pt s/p above. Pt independent with ADLs, PTA. Feel pt will benefit from acute OT to increase independence prior to d/c.     Follow Up Recommendations  Home health OT;Supervision/Assistance - 24 hour    Equipment Recommendations  None recommended by OT    Recommendations for Other Services       Precautions / Restrictions Precautions Precautions: Fall Restrictions Weight Bearing Restrictions: No      Mobility Bed Mobility Overal bed mobility: Needs Assistance Bed Mobility: Supine to Sit;Sit to Supine     Supine to sit: Supervision Sit to supine: Supervision      Transfers Overall transfer level: Needs assistance   Transfers: Sit to/from Stand Sit to Stand: Min guard              Balance  Pt holding to things when ambulating in room- Min guard.                                          ADL Overall ADL's : Needs assistance/impaired                     Lower Body Dressing: Min guard;Sit to/from stand   Toilet Transfer: Min guard;Ambulation;Stand-pivot;BSC   Toileting- Water quality scientist and Hygiene: Min guard;Sit to/from stand       Functional mobility during ADLs: Min guard General ADL Comments: Pt transferred from bed to [facility] and urinated.     Vision     Perception     Praxis      Pertinent Vitals/Pain Pain Assessment: 0-10 Pain Score: 3  Pain Location: groin Pain Intervention(s): Monitored during session;Other (comment) (using PCA)     Hand Dominance     Extremity/Trunk Assessment Upper Extremity Assessment Upper Extremity Assessment: Generalized weakness;Overall [facility] for tasks  assessed   Lower Extremity Assessment Lower Extremity Assessment: Defer to PT evaluation       Communication Communication Communication: No difficulties   Cognition Arousal/Alertness: Awake/alert Behavior During Therapy: WFL for tasks assessed/performed Overall Cognitive Status: No family/caregiver present to determine baseline cognitive functioning       Memory: Decreased short-term memory             General Comments       Exercises       Shoulder Instructions      Home Living Family/patient expects to be discharged to:: Private residence Living Arrangements: Alone Available Help at Discharge: Family;Available PRN/intermittently Type of Home: Apartment Home Access: Stairs to enter Entrance Stairs-Number of Steps: 1 Entrance Stairs-Rails: None Home Layout: One level     Bathroom Shower/Tub: Teacher, early years/pre: Standard     Home Equipment: Cane - single point;Walker - 2 wheels;Bedside commode;Shower seat          Prior Functioning/Environment Level of Independence: Independent with assistive device(s)        Comments: mobilizing with RW; receiving HHPT    OT Diagnosis: Acute pain   OT Problem List: Decreased strength;Impaired balance (sitting and/or standing);Pain;Decreased safety awareness;Decreased cognition;Decreased knowledge of use of DME or AE;Decreased knowledge of precautions  OT Treatment/Interventions: Self-care/ADL training;DME and/or AE instruction;Therapeutic activities;Patient/family education;Balance training;Cognitive remediation/compensation;Therapeutic exercise    OT Goals(Current goals can be found in the care plan section) Acute Rehab OT Goals Patient Stated Goal: not stated OT Goal Formulation: With patient Time For Goal Achievement: 01/10/15 Potential to Achieve Goals: Good ADL Goals Pt Will Perform Lower Body Bathing: sit to/from stand;with supervision (including gathering items) Pt Will Perform Lower  Body Dressing: sit to/from stand;with supervision (including gathering items) Pt Will Transfer to Toilet: with modified independence;ambulating (3 in 1 over commode) Pt Will Perform Toileting - Clothing Manipulation and hygiene: sit to/from stand;with modified independence Pt Will Perform Tub/Shower Transfer: Tub transfer;with supervision;ambulating;shower seat;rolling walker  OT Frequency: Min 2X/week   Barriers to D/C:            Co-evaluation              End of Session Equipment Utilized During Treatment: Gait belt;Oxygen (Oxygen for part of session) Nurse Communication: Mobility status;Other (comment) (cognition; bed alarm set)  Activity Tolerance: Patient tolerated treatment well Patient left: in bed;with call bell/phone within reach;with SCD's reapplied;with bed alarm set   Time: 7124-5809 OT Time Calculation (min): 15 min Charges:  OT General Charges $OT Visit: 1 Procedure OT Evaluation $Initial OT Evaluation Tier I: 1 Procedure G-CodesBenito Mccreedy OTR/L [phone] 01/03/2015, 10:11 AM

## 2015-01-03 NOTE — Consult Note (Signed)
WOC wound consult note Reason for Consult: place NPWT VAC dressing left groin Wound type: surgical  Measurement: 4.5cm x 5.0cm x 0.5cm  Wound bed: 100% clean, early granulation  Drainage (amount, consistency, odor) serous/lymph fluid pools when dressing off Periwound: intact, closed incision lateral from the open groin wound Dressing procedure/placement/frequency: 1pc of black foam used to fill wound bed, draped and sealed at 180mmHG.  Pt tolerated well.  WOC will follow along and support VAC dressing changes as needed. [place], [place]

## 2015-01-03 NOTE — Care Management Important Message (Signed)
Important Message  Patient Details  Name: Cynthia Bowen MRN: [id number] Date of Birth: November 19, 1945   Medicare Important Message Given:  [facility] notification given    Nathen May 01/03/2015, 12:44 [place] Message  Patient Details  Name: Cynthia Bowen MRN: [id number] Date of Birth: 1945-08-18   Medicare Important Message Given:  Yes-second notification given    Nathen May 01/03/2015, 12:43 PM

## 2015-01-03 NOTE — Progress Notes (Addendum)
[facility] [place]  Subjective  - Doing well over all no new complaints.   Objective 133/69 66 98.2 F (36.8 C) (Oral) 19 100%  Intake/Output Summary (Last 24 hours) at 01/03/15 0809 Last data filed at 01/03/15 3361  Gross per 24 hour  Intake   1510 ml  Output   2200 ml  Net   -690 ml    Right groin clean and dry Left groin beefy red base SS drainage Right doppler AT signal Left DP signal Feet feel warm to touch and active range of motion is intact Lungs non labored breathing Heart RRR  Assessment/Planning: POD #3 Excision of infected fem-fem BPG, L ax-PFA bypass  Continue IV antibiotics Cultures pending  PICC line to be placed today and start TPN today nutritional consult placed  Place wound vac on left groin today 125 continuous Dry dressing to right groin PT/OT  Laurence Slate [facility] 01/03/2015 8:09 AM --  Laboratory Lab Results:  Recent Labs  01/02/15 0228 01/03/15 0250  WBC 8.8 8.5  HGB 9.0* 8.8*  HCT 27.2* 26.6*  PLT 172 175   BMET  Recent Labs  01/01/15 0241  NA 138  K 3.4*  CL 103  CO2 28  GLUCOSE 115*  BUN <5*  CREATININE 0.63  CALCIUM 8.1*    COAG Lab Results  Component Value Date   INR 1.01 12/29/2014   INR 1.04 08/26/2014   INR 1.04 08/25/2014   No results found for: PTT    Addendum  I have independently interviewed and examined the patient, and I agree with the physician assistant's findings.  R groin sealing: dry dressing for now.  L groin clean, penrose without pus: VAC dressing.  PICC for TPN.  Nutrition consult for TPN. Cont abx.    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

## 2015-01-03 NOTE — Progress Notes (Signed)
PARENTERAL NUTRITION CONSULT NOTE - INITIAL  Pharmacy Consult for TPN Indication: poor NPO intake and severe protein malnutrition  Allergies  Allergen Reactions  . Avelox [Moxifloxacin Hcl In Nacl] Nausea And Vomiting  . Betadine [Povidone Iodine] Itching and Rash  . Alendronate Sodium Nausea And Vomiting and Other (See Comments)    dizziness  . Aspirin Nausea Only  . Codeine Nausea And Vomiting  . Doxycycline Nausea And Vomiting  . Fluconazole Nausea And Vomiting  . Hydrocodone Nausea And Vomiting    GI distress  . Hydrocodone-Acetaminophen Nausea And Vomiting  . Morphine And Related Nausea Only  . Neurontin [Gabapentin] Other (See Comments)    Mood changes   . Nsaids Other (See Comments)    Severe gastritis & perforation - avoid NSAIDs when possible  . Quinolones Hives and Itching  . Sertraline Hcl Nausea And Vomiting and Other (See Comments)    Hallucinations   . Sulfamethoxazole Hives and Itching  . Latex Rash  . Sulfa Antibiotics Rash    Patient Measurements: Height: 5\' 5"  (165.1 cm) Weight: 85 lb 15.7 oz (39 kg) IBW/kg (Calculated) : 57   Vital Signs: Temp: 98.2 F (36.8 C) (09/12 0400) Temp Source: Oral (09/12 0400) BP: 133/69 mmHg (09/12 0400) Pulse Rate: 66 (09/11 2352) Intake/Output from previous day: 09/11 0701 - 09/12 0700 In: 1560 [P.O.:360; I.V.:1150; IV Piggyback:50] Out: 2200 [Urine:2200] Intake/Output from this shift:    Labs:  Recent Labs  01/01/15 0241 01/02/15 0228 01/03/15 0250  WBC 12.9* 8.8 8.5  HGB 10.7* 9.0* 8.8*  HCT 31.5* 27.2* 26.6*  PLT 227 172 175     Recent Labs  01/01/15 0241  NA 138  K 3.4*  CL 103  CO2 28  GLUCOSE 115*  BUN <5*  CREATININE 0.63  CALCIUM 8.1*   Estimated Creatinine Clearance: 42 mL/min (by C-G formula based on Cr of 0.63).    Recent Labs  01/02/15 1526  GLUCAP 89    Medical History: Past Medical History  Diagnosis Date  . COPD (chronic obstructive pulmonary disease)   .  Pneumonia 12-2011  . GERD (gastroesophageal reflux disease)   . Headache(784.0)   . Arthritis     osteoarthritis  . Allergy   . Depression   . Neuromuscular disorder   . Osteoporosis   . Bronchitis     CURRENTLY AS OF 06/30/12 - HAS COUGH AND FINISHED ANTIBIOTIC FOR BRONCHITIS  . Fibromyalgia   . Pain     ABDOMINAL PAIN AND NAUSEA  . Pain     SOMETIMES PAIN RIGHT EAR AND NECK--STATES CAUSED BY A "LUMP" ON BACK OF EAR--USES KENALOG CREAM TOPICALLY AS NEEDED.  Marland Kitchen Gastrocutaneous fistula   . Anemia   . Anxiety   . Blood transfusion without reported diagnosis   . Heart murmur     young  . Peripheral vascular disease     hx  ?leg  . History of kidney stones    Insulin Requirements in the past 24 hours:  None ordered  Nutritional Goals:  1200-1400 kCal, 65-75 grams of protein per day per RD recommendations on 9/8  Current Nutrition:  Regular diet- has 165mL of breakfast this morning, as well as lunch and dinner yesterday Resource Breeze 1 container TID between meals  Assessment: 47 YOF admitted 9/7 with bilateral groin pain- she had stents placed bilaterally in May and was having drainage from the right groin site after a fluid collection popped 2 days PTA.    Surgeries/Procedures: excision of infected fem-fem  BPG, L ax-PFA bypass on 9/9. No abdominal surgeries notes  GI: prealbumin low at 15. Noted RD assessment from 9/8 states patient is malnourished. Has been receiving Megace without an increase in diet, though has been on a regular diet. Per Dr. Bridgett Larsson, her gut is not able to be used for an NG or OG. Noted from [place] she has had prior gastric surgeries including ex lap repair of gastric perforation 07/02/2012, omental patch of perforation and gastric leak repair 07/07/2012, repeat ex laps for leak repair in March 2014, small bowel resection, placement of jejunostomy and lysis of adhesions 11/11/2012. Per nutritional assessment, she eats what she can at home. She is currently on Bentyl  and Librax along with PO PPI.  Endo: no history of DM  Lytes: last labs from 9/10- K was low at 3.4 (was given 4 IV runs), CorCa ~8.9  Renal: SCr 0.63, CrCl ~40-71mL/min  Pulm: 100/1L   Cards: VSS, though HR can be brady. No meds.  Hepatobil: LFTs from 9/9. nml except for low albumin at 2.7, and low ALT at 12.  Neuro: no acute issues  ID:   Vancomycin and ceftazidime for infected left groin with abscess. Vac in place. WBC   Vanc 9/8>> Ceftaz 9/8>>  9/7 groin wound:negative 9/8 groin wound (on zinacef): negative 9/8 blood x2: ngtd 9/9 urine: negative 9/9 groin abscess (on cefazolin): ngtd 9/9 groin wound: ngtd  Best Practices: Lovenox  TPN Access: IR to place PICC 9/12 TPN start date: 9/12>>   Plan:  -start Clinimix E 5/15 at 48mL/hr (goal rate will be 94mL/hr- will provide 65g protein and 1330kcal without lipids) -start IVFE 20% at 71mL/hr. This combined with the Clinimix E 5/15 will provide 36g protein and 991kcal. Will likely need to decrease amount of lipid provision as Clinimix rate increases so as not to overfeed patient -CBGs and SSI q4h -decrease NS to 66mL/hr starting at 1800 tonight when TPN is hung -full TPN labs in the morning   Ethanael Veith D. Adilynn Bessey, PharmD, BCPS Clinical Pharmacist Pager: [phone] 01/03/2015 10:03 AM

## 2015-01-03 NOTE — Progress Notes (Signed)
Nutrition Follow-up  DOCUMENTATION CODES:   Underweight, Severe malnutrition in context of chronic illness  INTERVENTION:    TPN dosing per Pharmacy to meet nutrition needs.  Continue regular diet and Boost Breeze supplement PO TID.  NUTRITION DIAGNOSIS:   Increased nutrient needs related to chronic illness as evidenced by estimated needs.  Ongoing  GOAL:   Patient will meet greater than or equal to 90% of their needs  Unmet  MONITOR:   Diet advancement, PO intake, Labs, Weight trends, I & O's  REASON FOR ASSESSMENT:   Consult New TPN/TNA  ASSESSMENT:   69 y.o. Female with PMH of COPD, PNA, PVD, fibromyalgia; presented for evaluation of groin pain.  Labs reviewed: potassium is low.   Currently on a regular diet with Resource Breeze TID. Consumed 75% of breakfast and a Boost Breeze supplement this AM per discussion with RN. Per progress notes, patient's gut is not able to be used for NG for enteral nutrition. Hx of multiple gastric surgeries in 2014. ? Malabsorption vs inadequate oral intake. Patient with severe PCM.  Patient to begin TPN today with Clinimix E 5/15 at 30 ml/h (goal 55 ml/h) and 20% IVFE at 10 ml/h.    Diet Order:  Diet regular Room service appropriate?: Yes; Fluid consistency:: Thin TPN (CLINIMIX-E) Adult  Skin:  Reviewed, no issues  Last BM:  9/9  Height:   Ht Readings from Last 1 Encounters:  12/31/14 5\' 5"  (1.651 m)    Weight:   Wt Readings from Last 1 Encounters:  12/31/14 85 lb 15.7 oz (39 kg)    Ideal Body Weight:  56.8 kg  BMI:  Body mass index is 14.31 kg/(m^2).  Estimated Nutritional Needs:   Kcal:  1200-1400  Protein:  65-75 gm  Fluid:  >/= 1.5 L  EDUCATION NEEDS:   No education needs identified at this time  Molli Barrows, [place], [place], [place] Pager [phone] After Hours Pager [phone]

## 2015-01-03 NOTE — Progress Notes (Signed)
OT Cancellation Note  Patient Details Name: Cynthia Bowen MRN: [id number] DOB: March 24, 1946   Cancelled Treatment:    Reason Eval/Treat Not Completed: Other (comment) Pt getting wound vac placed at this time.   Leondre Taul L OTR/L 01/03/2015, 9:07 AM

## 2015-01-03 NOTE — Progress Notes (Signed)
Upon assessment pt's left groin wound dressing was falling apart and had to be changed notice that penrose drain appeared to pulled out to far spoke with Dr. Bridgett Larsson no new orders give will continue to monitor.

## 2015-01-03 NOTE — Care Management (Signed)
This patient is currently active with Arville Go with RN PT [facility]. I will follow for [facility] needs at discharge. Thank you!  Christa See RN BSN [place] [phone]

## 2015-01-04 ENCOUNTER — Inpatient Hospital Stay (HOSPITAL_COMMUNITY): Payer: Medicare Other

## 2015-01-04 DIAGNOSIS — M7989 Other specified soft tissue disorders: Secondary | ICD-10-CM

## 2015-01-04 LAB — CBC
HEMATOCRIT: 26.6 % — AB (ref 36.0–46.0)
HEMOGLOBIN: 8.7 g/dL — AB (ref 12.0–15.0)
MCH: 27.3 pg (ref 26.0–34.0)
MCHC: 32.7 g/dL (ref 30.0–36.0)
MCV: 83.4 fL (ref 78.0–100.0)
Platelets: 182 10*3/uL (ref 150–400)
RBC: 3.19 MIL/uL — ABNORMAL LOW (ref 3.87–5.11)
RDW: 16.7 % — AB (ref 11.5–15.5)
WBC: 8.1 10*3/uL (ref 4.0–10.5)

## 2015-01-04 LAB — COMPREHENSIVE METABOLIC PANEL WITH GFR
ALT: 9 U/L — ABNORMAL LOW (ref 14–54)
AST: 15 U/L (ref 15–41)
Albumin: 2.5 g/dL — ABNORMAL LOW (ref 3.5–5.0)
Alkaline Phosphatase: 60 U/L (ref 38–126)
Anion gap: 5 (ref 5–15)
BUN: 8 mg/dL (ref 6–20)
CO2: 29 mmol/L (ref 22–32)
Calcium: 8.8 mg/dL — ABNORMAL LOW (ref 8.9–10.3)
Chloride: 104 mmol/L (ref 101–111)
Creatinine, Ser: 0.58 mg/dL (ref 0.44–1.00)
GFR calc Af Amer: >60 mL/min
GFR calc non Af Amer: >60 mL/min
Glucose, Bld: 106 mg/dL — ABNORMAL HIGH (ref 65–99)
Potassium: 3.9 mmol/L (ref 3.5–5.1)
Sodium: 138 mmol/L (ref 135–145)
Total Bilirubin: 0.3 mg/dL (ref 0.3–1.2)
Total Protein: 5.7 g/dL — ABNORMAL LOW (ref 6.5–8.1)

## 2015-01-04 LAB — MAGNESIUM: Magnesium: 1.9 mg/dL (ref 1.7–2.4)

## 2015-01-04 LAB — GLUCOSE, CAPILLARY
GLUCOSE-CAPILLARY: 100 mg/dL — AB (ref 65–99)
GLUCOSE-CAPILLARY: 102 mg/dL — AB (ref 65–99)
GLUCOSE-CAPILLARY: 113 mg/dL — AB (ref 65–99)
GLUCOSE-CAPILLARY: 135 mg/dL — AB (ref 65–99)
Glucose-Capillary: 113 mg/dL — ABNORMAL HIGH (ref 65–99)
Glucose-Capillary: 132 mg/dL — ABNORMAL HIGH (ref 65–99)

## 2015-01-04 LAB — CULTURE, BLOOD (ROUTINE X 2): CULTURE: NO GROWTH

## 2015-01-04 LAB — TRIGLYCERIDES: TRIGLYCERIDES: 59 mg/dL (ref <150)

## 2015-01-04 LAB — CULTURE, ROUTINE-ABSCESS: CULTURE: NO GROWTH

## 2015-01-04 LAB — CULTURE, BLOOD (SINGLE): Culture: NO GROWTH

## 2015-01-04 LAB — VANCOMYCIN, TROUGH: Vancomycin Tr: 6 ug/mL — ABNORMAL LOW (ref 10.0–20.0)

## 2015-01-04 LAB — DIFFERENTIAL
Basophils Absolute: 0 K/uL (ref 0.0–0.1)
Basophils Relative: 0 % (ref 0–1)
Eosinophils Absolute: 0.1 K/uL (ref 0.0–0.7)
Eosinophils Relative: 1 % (ref 0–5)
Lymphocytes Relative: 13 % (ref 12–46)
Lymphs Abs: 1 K/uL (ref 0.7–4.0)
Monocytes Absolute: 0.6 K/uL (ref 0.1–1.0)
Monocytes Relative: 7 % (ref 3–12)
Neutro Abs: 6.4 K/uL (ref 1.7–7.7)
Neutrophils Relative %: 79 % — ABNORMAL HIGH (ref 43–77)

## 2015-01-04 LAB — PHOSPHORUS: PHOSPHORUS: 3.1 mg/dL (ref 2.5–4.6)

## 2015-01-04 LAB — PREALBUMIN: PREALBUMIN: 14.5 mg/dL — AB (ref 18–38)

## 2015-01-04 MED ORDER — CLINIMIX E/DEXTROSE (5/15) 5 % IV SOLN
INTRAVENOUS | Status: AC
Start: 2015-01-04 — End: 2015-01-05
  Administered 2015-01-04: 18:00:00 via INTRAVENOUS
  Filled 2015-01-04: qty 960

## 2015-01-04 MED ORDER — SODIUM CHLORIDE 0.9 % IV SOLN
INTRAVENOUS | Status: DC
  Administered 2015-01-04: 10 mL/h via INTRAVENOUS
  Administered 2015-01-05: 19:00:00 via INTRAVENOUS
  Administered 2015-01-10: 10 mL/h via INTRAVENOUS

## 2015-01-04 MED ORDER — FAT EMULSION 20 % IV EMUL
240.0000 mL | INTRAVENOUS | Status: AC
  Administered 2015-01-04: 240 mL via INTRAVENOUS
  Filled 2015-01-04: qty 250

## 2015-01-04 MED ORDER — VANCOMYCIN HCL IN DEXTROSE 750-5 MG/150ML-% IV SOLN
750.0000 mg | Freq: Two times a day (BID) | INTRAVENOUS | Status: DC
  Administered 2015-01-05 – 2015-01-12 (×15): 750 mg via INTRAVENOUS
  Filled 2015-01-04 (×17): qty 150

## 2015-01-04 NOTE — Progress Notes (Signed)
Pt admitted from 3S to 2W18. She was oriented to unit and unit routines and reminded to use the call light to make her needs known. She verbalized understanding. TPN and lipids infusing via central line. Dilaudid PCA also infusing.

## 2015-01-04 NOTE — Progress Notes (Signed)
ANTIBIOTIC CONSULT NOTE - Follow Up Consult  Pharmacy Consult for Vancomycin Indication: infected AV graft with abscess  Allergies  Allergen Reactions  . Avelox [Moxifloxacin Hcl In Nacl] Nausea And Vomiting  . Betadine [Povidone Iodine] Itching and Rash  . Alendronate Sodium Nausea And Vomiting and Other (See Comments)    dizziness  . Aspirin Nausea Only  . Codeine Nausea And Vomiting  . Doxycycline Nausea And Vomiting  . Fluconazole Nausea And Vomiting  . Hydrocodone Nausea And Vomiting    GI distress  . Hydrocodone-Acetaminophen Nausea And Vomiting  . Morphine And Related Nausea Only  . Neurontin [Gabapentin] Other (See Comments)    Mood changes   . Nsaids Other (See Comments)    Severe gastritis & perforation - avoid NSAIDs when possible  . Quinolones Hives and Itching  . Sertraline Hcl Nausea And Vomiting and Other (See Comments)    Hallucinations   . Sulfamethoxazole Hives and Itching  . Latex Rash  . Sulfa Antibiotics Rash    Patient Measurements: Height: 5\' 5"  (165.1 cm) Weight: 85 lb 15.7 oz (39 kg) IBW/kg (Calculated) : 57  Vital Signs: Temp: 99.4 F (37.4 C) (09/13 2011) Temp Source: Oral (09/13 2011) BP: 131/63 mmHg (09/13 2011) Pulse Rate: 89 (09/13 2011) Intake/Output from previous day: 09/12 0701 - 09/13 0700 In: 2569.3 [P.O.:680; I.V.:1250; IV Piggyback:100; TPN:539.3] Out: 1900 [Urine:1850; Drains:50] Intake/Output from this shift:    Labs:  Recent Labs  01/02/15 0228 01/03/15 0250 01/04/15 0525  WBC 8.8 8.5 8.1  HGB 9.0* 8.8* 8.7*  PLT 172 175 182  CREATININE  --   --  0.58   Estimated Creatinine Clearance: 42 mL/min (by C-G formula based on Cr of 0.58).  Assessment: 68yof continues on vancomycin and ceftazidime for infected left groin graft/abscess s/p I&D 9/8 and excision of infected graft and placement of new graft on 9/9. Planning wound VAC tomorrow. Cultures remain negative. Renal function has been stable.  9/8  Vancomycin>> 9/8 Ceftazidime per MD>> 9/7 groin wound - negative  9/8 groin wound x 2 (left and right groin wounds, on zinacef) - negative  9/8 blood x2 - ng x 3 days so far  9/9 urine - negative  9/9 groin abscess (on cefazolin) - ng x 2 days so far  9/9 groin wound - ng x 2 days so far  9/9 MRSA screen neg  9/8 MRSA screen POS for Staph but neg for MRSA  VT is subtherapeutic at 6 mcg/mL on vancomycin 1g q24h. Pt is afebrile, WBC 8.1, sCr 0.58.  Goal of Therapy:  Vancomycin trough level 15-20 mcg/ml  Plan:  Increase vancomycin to 750mg  IV q12h Continue ceftazidime 2g IV q12 per MD  Andrey Cota. Diona Foley, PharmD Clinical Pharmacist Pager [phone] 01/04/2015,11:28 PM

## 2015-01-04 NOTE — Progress Notes (Addendum)
Progress Note    01/04/2015 7:35 AM 4 Days Post-Op  Subjective:  No complaints  Tm 99.6 now afebrile HR 24'M-01'U NSR 272'Z systolic 36% [id number]  Filed Vitals:   01/04/15 0402  BP: 126/66  Pulse: 71  Temp:   Resp: 14    Physical Exam: Cardiac:  regular Lungs:  Non labored Incisions:  All incisions look good.  The left groin wound with wound vac in place with good seal Extremities:  Biphasic left DP signal; left foot is warm.   CBC    Component Value Date/Time   WBC 8.1 01/04/2015 0525   WBC 6.3 10/01/2014 1434   WBC 8.2 08/26/2012 1300   RBC 3.19* 01/04/2015 0525   RBC 3.75* 10/01/2014 1434   RBC 3.73* 08/26/2012 1300   RBC 2.75* 08/06/2012 0800   HGB 8.7* 01/04/2015 0525   HGB 10.6* 10/01/2014 1434   HGB 10.6* 08/26/2012 1300   HCT 26.6* 01/04/2015 0525   HCT 32.8* 10/01/2014 1434   HCT 31.2* 08/26/2012 1300   PLT 182 01/04/2015 0525   PLT 516* 08/26/2012 1300   MCV 83.4 01/04/2015 0525   MCV 87.6 10/01/2014 1434   MCV 84 08/26/2012 1300   MCH 27.3 01/04/2015 0525   MCH 28.4 10/01/2014 1434   MCH 28.3 08/26/2012 1300   MCHC 32.7 01/04/2015 0525   MCHC 32.4 10/01/2014 1434   MCHC 33.9 08/26/2012 1300   RDW 16.7* 01/04/2015 0525   RDW 17.1* 08/26/2012 1300   LYMPHSABS 1.0 01/04/2015 0525   LYMPHSABS 1.4 08/26/2012 1300   MONOABS 0.6 01/04/2015 0525   MONOABS 0.3 08/26/2012 1300   EOSABS 0.1 01/04/2015 0525   EOSABS 0.1 08/26/2012 1300   BASOSABS 0.0 01/04/2015 0525   BASOSABS 0.0 08/26/2012 1300    BMET    Component Value Date/Time   NA 138 01/04/2015 0525   NA 138 08/26/2012 1300   K 3.9 01/04/2015 0525   K 4.0 08/26/2012 1300   CL 104 01/04/2015 0525   CL 108* 08/26/2012 1300   CO2 29 01/04/2015 0525   CO2 24 08/26/2012 1300   GLUCOSE 106* 01/04/2015 0525   GLUCOSE 83 08/26/2012 1300   BUN 8 01/04/2015 0525   BUN 29* 08/26/2012 1300   BUN 4 08/19/2009   CREATININE 0.58 01/04/2015 0525   CREATININE 0.65 10/01/2014 1420   CREATININE 0.79 08/26/2012 1300   CALCIUM 8.8* 01/04/2015 0525   CALCIUM 9.9 08/26/2012 1300   GFRNONAA >60 01/04/2015 0525   GFRNONAA >89 07/16/2014 0948   GFRNONAA >60 08/26/2012 1300   GFRAA >60 01/04/2015 0525   GFRAA >89 07/16/2014 0948   GFRAA >60 08/26/2012 1300    INR    Component Value Date/Time   INR 1.01 12/29/2014 2334     Intake/Output Summary (Last 24 hours) at 01/04/15 0735 Last data filed at 01/04/15 0600  Gross per 24 hour  Intake 2479.33 ml  Output   1900 ml  Net 579.33 ml   Wound Cx:  No growth x 2 days Urine Cx:  No growth x 1 day  Assessment:  69 y.o. female is s/p:  1. Extended repeat bilateral femoral artery exposure 2. Excision of infected femorofemoral graft 3. Bovine patch angioplasty of bilateral common femoral artery  Left leg angiogram  4 Days Post-Op  Plan: -pt with patent bypass graft with biphasic left DP -wounds healing nicely so far.  Good seal on the left groin wound vac -continue TPN for nutrition as well as oral supplements to enhance  healing -continue ABx per ID -DVT prophylaxis:  Lovenox -will transfer to [place], [place] [facility] [phone] 01/04/2015 7:35 AM  Addendum  I have independently interviewed and examined the patient, and I agree with the physician assistant's findings.  All incisions look clean, without odor and no signs of residual infection.  R incision is healing: dry dressing, L incision: VAC M-W-F, L ax-PFA bypass incisions appear without hematoma or infection.  Dopplerable AT signals in both feet.  Pt asx for now.  Cont TPN and IV Abx.  Nothing growing so far on wound cultures.  - Ideally would like 2 weeks of TPN minimally, may need 4 weeks to heal up her incisions - I will keep her in hospital until her initial run of abx are completed.   - Will need to see what ID thinks about an extended period of abx will be need and whether chronic suppression will  be needed due to the covered aortic stent that may have been contaminated by the infected femorofemoral bypass - At some point, the patient may need to consider a SB follow through study to determine why she has just significant malabsorption   Adele Barthel, MD [facility] [place]: [phone] Pager: [phone]  01/04/2015, 9:13 AM

## 2015-01-04 NOTE — Progress Notes (Signed)
Physical Therapy Treatment Patient Details Name: LIZANN EDELMAN MRN: [id number] DOB: April 11, 1946 Today's Date: 01/04/2015    History of Present Illness Adm 9/7 with infected fem-fem BPG; 9/8 I& D bil groin with removal of graft; 9/9 underwent Lt axillary-femoral BPG    PT Comments    Progressing well. Emphasis on gait stability and activity tolerance.   Follow Up Recommendations  Other (comment);Home health PT;Supervision/Assistance - 24 hour (talk of needing LTAC for Abx and nutrition)     Equipment Recommendations  Other (comment) (wants a rollator)    Recommendations for Other Services       Precautions / Restrictions Precautions Precautions: Fall    Mobility  Bed Mobility Overal bed mobility: Needs Assistance Bed Mobility: Supine to Sit     Supine to sit: Supervision     General bed mobility comments: up with little effort  Transfers Overall transfer level: Needs assistance Equipment used: Rolling walker (2 wheeled) Transfers: Sit to/from Stand Sit to Stand: Supervision         General transfer comment: vc for hand placement  Ambulation/Gait Ambulation/Gait assistance: Supervision Ambulation Distance (Feet): 1000 Feet Assistive device: Rolling walker (2 wheeled) Gait Pattern/deviations: Step-through pattern Gait velocity: can vary to age appropriate speed   General Gait Details: steady gait and ability to increase speed noticeably to cuing   Stairs            Wheelchair Mobility    Modified Rankin (Stroke Patients Only)       Balance Overall balance assessment: No apparent balance deficits (not formally assessed)   Sitting balance-Leahy Scale: Normal       Standing balance-Leahy Scale: Fair                      Cognition Arousal/Alertness: Awake/alert Behavior During Therapy: WFL for tasks assessed/performed Overall Cognitive Status: Within Functional Limits for tasks assessed                      Exercises       General Comments        Pertinent Vitals/Pain Pain Assessment: No/denies pain    Home Living                      Prior Function            PT Goals (current goals can now be found in the care plan section) Acute Rehab PT Goals Patient Stated Goal: not stated PT Goal Formulation: With patient Time For Goal Achievement: 01/08/15 Potential to Achieve Goals: Good Progress towards PT goals: Progressing toward goals    Frequency  Min 3X/week    PT Plan Current plan remains appropriate    Co-evaluation             End of Session   Activity Tolerance: Patient tolerated treatment well Patient left: in chair;with call bell/phone within reach     Time: 1600-1630 PT Time Calculation (min) (ACUTE ONLY): 30 min  Charges:  $Gait Training: 23-37 mins                    G Codes:      Delores Thelen, Tessie Fass 01/04/2015, 4:46 PM  01/04/2015  Donnella Sham, PT [phone]  (pager)

## 2015-01-04 NOTE — Progress Notes (Signed)
[facility] NOTE  Pharmacy Consult for TPN Indication: poor NPO intake and severe protein malnutrition s/p multiple GI surgeries in the past  Allergies  Allergen Reactions  . Avelox [Moxifloxacin Hcl In Nacl] Nausea And Vomiting  . Betadine [Povidone Iodine] Itching and Rash  . Alendronate Sodium Nausea And Vomiting and Other (See Comments)    dizziness  . Aspirin Nausea Only  . Codeine Nausea And Vomiting  . Doxycycline Nausea And Vomiting  . Fluconazole Nausea And Vomiting  . Hydrocodone Nausea And Vomiting    GI distress  . Hydrocodone-Acetaminophen Nausea And Vomiting  . Morphine And Related Nausea Only  . Neurontin [Gabapentin] Other (See Comments)    Mood changes   . Nsaids Other (See Comments)    Severe gastritis & perforation - avoid NSAIDs when possible  . Quinolones Hives and Itching  . Sertraline Hcl Nausea And Vomiting and Other (See Comments)    Hallucinations   . Sulfamethoxazole Hives and Itching  . Latex Rash  . Sulfa Antibiotics Rash    Patient Measurements: Height: 5\' 5"  (165.1 cm) Weight: 85 lb 15.7 oz (39 kg) IBW/kg (Calculated) : 57   Vital Signs: Temp: 98.3 F (36.8 C) (09/13 0702) Temp Source: Oral (09/13 0702) BP: 126/66 mmHg (09/13 0402) Pulse Rate: 71 (09/13 0402) Intake/Output from previous day: 09/12 0701 - 09/13 0700 In: 2479.3 [P.O.:680; I.V.:1200; IV Piggyback:100; TPN:499.3] Out: 1900 [Urine:1850; Drains:50] Intake/Output from this shift: Total I/O In: 200 [P.O.:200] Out: -   Labs:  Recent Labs  01/02/15 0228 01/03/15 0250 01/04/15 0525  WBC 8.8 8.5 8.1  HGB 9.0* 8.8* 8.7*  HCT 27.2* 26.6* 26.6*  PLT 172 175 182     Recent Labs  01/04/15 0525  NA 138  K 3.9  CL 104  CO2 29  GLUCOSE 106*  BUN 8  CREATININE 0.58  CALCIUM 8.8*  MG 1.9  PHOS 3.1  PROT 5.7*  ALBUMIN 2.5*  AST 15  ALT 9*  ALKPHOS 60  BILITOT 0.3  PREALBUMIN 14.5*  TRIG 59   Estimated Creatinine Clearance: 42 mL/min (by  C-G formula based on Cr of 0.58).    Recent Labs  01/03/15 2315 01/04/15 0351 01/04/15 0759  GLUCAP 111* 100* 113*   Insulin Requirements in the past 24 hours:  2 units SSI  Nutritional Goals:  1200-1400 kCal, 65-75 grams of protein per day per RD recommendations on 9/8  Current Nutrition:  Clinimix E 5/15 at 48mL/hr + IVFE 20% at 3mL/hr- provides 36g protein and 871.5kcal Resource Breeze 1 container TID between meals Regular diet- ate 50% of breakfast this morning  Assessment: 49 YOF admitted 9/7 with bilateral groin pain- she had stents placed bilaterally in May and was having drainage from the right groin site after a fluid collection popped 2 days PTA.    Surgeries/Procedures: excision of infected fem-fem BPG, L ax-PFA bypass on 9/9. Multiple abdominal surgeries noted in 2014 as per PMH- ex lap repair of gastric perforation 07/02/2012, omental patch of perforation and gastric leak repair 07/07/2012, repeat ex laps for leak repair in March 2014, small bowel resection, placement of jejunostomy and lysis of adhesions 11/11/2012.  GI: prealbumin low at 15. Noted RD assessment from 9/8 states patient is malnourished. Has been receiving Megace without an increase in diet, though has been on a regular diet. Per nutritional assessment, she eats what she can at home. Prealbumin of 14.5 indicates poor nutritional status. She is currently on Bentyl and Librax along with PO PPI.  Last BM charted 9/9.   Endo: no history of DM. CBGs 100-115 since starting TPN last evening  Lytes: Na/Cl nml, K 3.9, Phos 3.1, Mag 1.9, Corca ~9.8- no evidence of refeeding on this morning's labs  Renal: SCr 0.58, CrCl ~40-1mL/min  Pulm: 100/1L [place]  Cards: VSS, though HR can be brady. No meds.  Hepatobil: LFTs wnl except low ALT at 9. Albumin low at 2.5, baseline trigs 59.  Neuro: no acute issues  ID:   Vancomycin and ceftazidime for infected left groin with abscess. Vac in place. WBC 8.1, afebrile. Plans to  continue antibiotics x1 month per ID note yesterday  Vanc 9/8>> Ceftaz 9/8>>  9/7 groin wound: multiple organisms, none predominant 9/8 L&R groin wound (on zinacef): neg 9/8 blood x2: ngtd 9/9 urine: neg 9/9 L groin abscess (on cefazolin): neg 9/9 L groin wound: neg  Best Practices: Lovenox  TPN Access: IR placed PICC 9/12 TPN start date: 9/12>>   Plan:  -increase Clinimix E 5/15 to 72mL/hr + IVFE 20% at 37mL/hr- this will provide 48g protein and 1162kcal. *goal rate will be Clinimix 5/15 at 21mL/hr + IVFE 20% at 51mL/hr- will provide 66g protein and 1417kcal -continue multivitamin and trace elements in TPN -continue CBGs and SSI q4h -decrease NS to KVO starting at 1800 tonight when new TPN is hung -BMET, mag and phos in the morning  Lanice Folden D. Khian Remo, PharmD, BCPS Clinical Pharmacist Pager: [phone] 01/04/2015 10:24 AM

## 2015-01-04 NOTE — Progress Notes (Signed)
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     RIGHT    LEFT    PRESSURE WAVEFORM  PRESSURE WAVEFORM  BRACHIAL N/A IV Triphasic BRACHIAL 133 Triphaisc  DP 77 monophasic DP 70 Monophasic  PT 0 absent PT 0 absent    RIGHT LEFT  ABI 0.58 0.53     Hiroshi Krummel D, RVT 01/04/2015, 12:07 PM      Ramirez Fullbright, Bonnye Fava, RVT 01/04/2015, 12:06 PM

## 2015-01-04 NOTE — Progress Notes (Signed)
CM received consult for LTAC. CM spoke to pt regarding consultation for LTAC, referral made with Andrea([facility]) @ [phone] and Brian([facility]) @[phone].  CM to f/u with disposition needs. Whitman Hero RN,BSN,CM [phone]

## 2015-01-04 NOTE — Progress Notes (Signed)
Report given to [name], RN on 2W. Patient to be transferred to 2W18 on tele via bed by Burman Nieves, RN and Gari Crown, RN. Belongings sent with patient. Family updated on patient's transfer.

## 2015-01-05 LAB — GLUCOSE, CAPILLARY
GLUCOSE-CAPILLARY: 112 mg/dL — AB (ref 65–99)
GLUCOSE-CAPILLARY: 102 mg/dL — AB (ref 65–99)
Glucose-Capillary: 115 mg/dL — ABNORMAL HIGH (ref 65–99)
Glucose-Capillary: 88 mg/dL (ref 65–99)
Glucose-Capillary: 167 mg/dL — ABNORMAL HIGH (ref 65–99)

## 2015-01-05 LAB — BASIC METABOLIC PANEL
ANION GAP: 7 (ref 5–15)
BUN: 12 mg/dL (ref 6–20)
CALCIUM: 9.1 mg/dL (ref 8.9–10.3)
CO2: 30 mmol/L (ref 22–32)
Chloride: 101 mmol/L (ref 101–111)
Creatinine, Ser: 0.58 mg/dL (ref 0.44–1.00)
GFR calc Af Amer: >60 mL/min (ref >60)
GFR calc non Af Amer: >60 mL/min (ref >60)
GLUCOSE: 115 mg/dL — AB (ref 65–99)
POTASSIUM: 4.2 mmol/L (ref 3.5–5.1)
Sodium: 138 mmol/L (ref 135–145)

## 2015-01-05 LAB — CBC
HEMATOCRIT: 26.5 % — AB (ref 36.0–46.0)
Hemoglobin: 8.7 g/dL — ABNORMAL LOW (ref 12.0–15.0)
MCH: 27.4 pg (ref 26.0–34.0)
MCHC: 32.8 g/dL (ref 30.0–36.0)
MCV: 83.3 fL (ref 78.0–100.0)
Platelets: 215 10*3/uL (ref 150–400)
RBC: 3.18 MIL/uL — ABNORMAL LOW (ref 3.87–5.11)
RDW: 16.5 % — AB (ref 11.5–15.5)
WBC: 8.1 10*3/uL (ref 4.0–10.5)

## 2015-01-05 LAB — MAGNESIUM: Magnesium: 1.8 mg/dL (ref 1.7–2.4)

## 2015-01-05 LAB — PHOSPHORUS: Phosphorus: 3.7 mg/dL (ref 2.5–4.6)

## 2015-01-05 MED ORDER — WARFARIN - PHARMACIST DOSING INPATIENT
Freq: Every day | Status: DC
  Administered 2015-01-05 – 2015-01-11 (×3)

## 2015-01-05 MED ORDER — MAGNESIUM SULFATE IN D5W 10-5 MG/ML-% IV SOLN
1.0000 g | Freq: Once | INTRAVENOUS | Status: AC
  Administered 2015-01-05: 1 g via INTRAVENOUS
  Filled 2015-01-05: qty 100

## 2015-01-05 MED ORDER — WARFARIN SODIUM 4 MG PO TABS
4.0000 mg | ORAL_TABLET | Freq: Once | ORAL | Status: AC
  Administered 2015-01-05: 4 mg via ORAL
  Filled 2015-01-05: qty 1

## 2015-01-05 MED ORDER — FAT EMULSION 20 % IV EMUL
240.0000 mL | INTRAVENOUS | Status: AC
  Administered 2015-01-05: 240 mL via INTRAVENOUS
  Filled 2015-01-05: qty 250

## 2015-01-05 MED ORDER — TRACE MINERALS CR-CU-MN-SE-ZN 10-1000-500-60 MCG/ML IV SOLN
INTRAVENOUS | Status: AC
  Administered 2015-01-05: 18:00:00 via INTRAVENOUS
  Filled 2015-01-05: qty 1320

## 2015-01-05 MED ORDER — ENOXAPARIN SODIUM 40 MG/0.4ML ~~LOC~~ SOLN
40.0000 mg | Freq: Two times a day (BID) | SUBCUTANEOUS | Status: DC
  Administered 2015-01-05 – 2015-01-10 (×10): 40 mg via SUBCUTANEOUS
  Filled 2015-01-05 (×12): qty 0.4

## 2015-01-05 NOTE — Progress Notes (Signed)
ANTICOAGULATION CONSULT NOTE - Initial Consult  Pharmacy Consult for Lovenox and Coumadin Indication: tenuous blood flow to left leg via axilloprofunda bypass, anticoagulation as an adjunct  Patient Measurements: Height: 5\' 5"  (165.1 cm) Weight: 85 lb 15.7 oz (39 kg) IBW/kg (Calculated) : 57  Labs:  Recent Labs  01/03/15 0250 01/04/15 0525 01/05/15 0440  HGB 8.8* 8.7* 8.7*  HCT 26.6* 26.6* 26.5*  PLT 175 182 215  CREATININE  --  0.58 0.58    Estimated Creatinine Clearance: 42 mL/min (by C-G formula based on Cr of 0.58).  Assessment:   Pharmacy is assisting with TPN management and Vancomycin dosing.  Now to begin full-dose anticoagulation as adjunct tonight for tenuous blood flow to left leg via axilloprofunda bypass.  Has been on Lovenox 20 mg sq q24hrs at 10pm, ~ 0.5 mg/kg/day. Weight 39 kg. May be sensitive to Coumadin doses due to low body weight, albumin 2.5 and varialbe PO intake.  Goal of Therapy:  INR 2-3 Anti-Xa level 0.6-1 units/ml 4hrs after LMWH dose given Monitor platelets by anticoagulation protocol: Yes   Plan:   Adjust Lovenox to 40 mg sq q12hrs (~ 1 mg/kg/dose).  Coumadin 4 mg x 1 tonight.  Daily PT/INR.  Intermittent CBC.  Arty Baumgartner, [place] Pager: [phone] 01/05/2015,7:13 PM

## 2015-01-05 NOTE — Care Management (Signed)
CM assessed pt and educated on the safety risk with going home without 24 hour support.  In addition: [employer] also met with pt at bedside and discussed the importance of a safe discharge home plan, at this time resource agrees with [facility] placement due to the complexity of care needed post discharge.  Both [facility] liaison met with pt 01/04/15 while pt was on 3S.  Pt informed CM that she is agreeable to discharge to [facility].  CM contacted [facility] liaison and was informed that bed is available as of today 01/05/15 and that pt has been accepted based on; TPN, wound vac and IV antibotics.  CM contacted MD and informed of bed acceptance at [facility].  Per MD; pt is at a high risk of rupture related to graft from surgery and wound vac, therefore pt will need to remain at facility for wound stability. CM informed [facility], bed availability/appropriatness will have to be redetermined when pt is medically ready for discharge.

## 2015-01-05 NOTE — Progress Notes (Signed)
Occupational Therapy Treatment Patient Details Name: Cynthia Bowen MRN: [id number] DOB: [birth year] Today's Date: 01/05/2015    History of present illness Adm 9/7 with infected fem-fem BPG; 9/8 I& D bil groin with removal of graft; 9/9 underwent Lt axillary-femoral BPG   OT comments  Patient progressing towards OT goals, continue OT plan of care for now. Pt with multiple lines, which seems to decrease her dynamic standing balance and independence/safety with mobility/ambulation. Pt aware to call for all needs and assistance prior to getting up.    Follow Up Recommendations  Supervision/Assistance - 24 hour;LTACH;SNF (according to chart, pt to transfer to SNF or LTACH; if going home pt will need HHOT)    Equipment Recommendations  None recommended by OT    Recommendations for Other Services  None at this time   Precautions / Restrictions Precautions Precautions: Fall Restrictions Weight Bearing Restrictions: No    Mobility Bed Mobility Overal bed mobility: Needs Assistance Bed Mobility: Supine to Sit     Supine to sit: Supervision     General bed mobility comments: HOB slightly elevated and minimal use of bed rails  Transfers Overall transfer level: Needs assistance Equipment used: None;1 person hand held assist Transfers: Sit to/from Stand Sit to Stand: Min guard         General transfer comment: Min guard for safety and due to multiple lines    Balance Overall balance assessment: Needs assistance Sitting-balance support: No upper extremity supported;Feet supported Sitting balance-Leahy Scale: Normal     Standing balance support: No upper extremity supported;During functional activity Standing balance-Leahy Scale: Fair Standing balance comment: Probably due to multiple lines   ADL Overall ADL's : Needs assistance/impaired General ADL Comments: Pt overall supervision>min guard for ADLs and functional mobility. Pt limited due to multiple lines. Pt able to cross  BLEs for LB ADLs. Pt engaged in bed mobility, sat EOB, and stood without AD. Pt then ambulated from EOB>BR for toilet transfer onto standard height toilet seat. Pt requested to stay up in recliner at end of session.      Cognition   Behavior During Therapy: WFL for tasks assessed/performed Overall Cognitive Status: Within Functional Limits for tasks assessed                 Pertinent Vitals/ Pain       Pain Assessment: Faces Faces Pain Scale: Hurts a little bit Pain Location: groin Pain Descriptors / Indicators: Discomfort;Guarding Pain Intervention(s): Monitored during session;Repositioned;Other (comment) (using PCA)   Frequency Min 2X/week     Progress Toward Goals  OT Goals(current goals can now befound in the care plan section)  Progress towards OT goals: Progressing toward goals     Plan Discharge plan needs to be updated    End of Session Equipment Utilized During Treatment: Oxygen   Activity Tolerance Patient tolerated treatment well   Patient Left with call bell/phone within reach;in chair    Time: 0263-7858 OT Time Calculation (min): 22 min  Charges: OT General Charges $OT Visit: 1 Procedure OT Treatments $Self Care/Home Management : 8-22 mins  Cynthia Bowen , MS, OTR/L, CLT Pager: [phone]  01/05/2015, 2:04 PM

## 2015-01-05 NOTE — Care Management Note (Signed)
Case Management Note  Patient Details  Name: Cynthia Bowen MRN: [id number] Date of Birth: 20-Sep-1945  Subjective/Objective:      Pt admitted with groin pain.    5 days s/p left axilla deep femoral artery bypass with 8 mm PTFE graft and  1. Extended repeat bilateral femoral artery exposure 2. Excision of infected femorofemoral graft 3. Bovine patch angioplasty of bilateral common femoral artery  Left leg angiogram    Action/Plan:  Pt is from home, active with [place].  CM received consult for LTAC.   CM assessed pt and educated on the safety risk with going home without 24 hour support. In addition: [employer] also met with pt at bedside and discussed the importance of a safe discharge home plan, at this time resource agrees with [facility] placement due to the complexity of care needed post discharge. Both [facility] liaison met with pt 01/04/15 while pt was on 3S. Pt informed CM that she is agreeable to discharge to [facility]. CM contacted [facility] liaison and was informed that bed is available as of today 01/05/15 and that pt has been accepted based on; TPN, wound vac and IV antibotics. CM contacted MD and informed of bed acceptance at [facility]. Per MD; pt is at a high risk of rupture related to graft from surgery and wound vac, therefore pt will need to remain at facility for wound stability. CM informed [facility], bed availability/appropriatness will have to be redetermined when pt is medically ready for discharge.   Expected Discharge Date:                  Expected Discharge Plan:  Long Term Acute Care (LTAC) [facility]   In-House Referral:     Discharge planning Services  CM Consult  Post Acute Care Choice:    Choice offered to:  Patient  DME Arranged:  N/A DME Agency:  NA  HH Arranged:    [place] Agency:     Status of Service:  In process, will continue to follow  Medicare Important Message Given:  Yes-second notification given Date Medicare IM Given:     Medicare IM give by:    Date Additional Medicare IM Given:    Additional Medicare Important Message give by:     If discussed at [place] of Stay Meetings, dates discussed:    Additional Comments:  [name], RN 01/05/2015, 10:59 AM

## 2015-01-05 NOTE — Progress Notes (Signed)
Daily Progress Note  Assessment/Planning: POD #5 s/p Excision of fem-fem graft, L ax-PFA bypass  Active medical issues:  Infected fem-fem BPG: continue abx for 1 month per ID, R groin healing: dry dressing, L groin: no granulation, serous drainage from fem-fem tract; continue VAC in L groin to drain the L groin and the fem-fem tract  Chronic protein malnutrition due to sequalae from her multiple abdominal surg.: continue TPN for 2-4 weeks, continue PO intake also, pt's prior PO intake has obviously been inadequate leading to incomplete healing from the fem-fem BPG leading to the graft infection  Bilateral PAD: known bilateral SFA occlusion with collateral flow to feet via profunda femoral artery collaterals.  Left leg now dependent on L ax-PFA bypass.  This bypass has limited patencies, so will proceed with anticoagulation and load of coumadin to therapeutic prior to bypass to improve her chances of keeping her left leg.  She is at risk for left hip dysarticulation if the ax-PFA graft occludes.  Overall, will need another 3-5 days to load coumadin.  If the left groin demonstrates some granulation by then, ok to trsf to SNF/LTAC setting  Transition to PO pain rx  PT/OT continue  Subjective  - 5 Days Post-Op  No complaints, no appetite  Objective Filed Vitals:   01/05/15 0422 01/05/15 0754 01/05/15 1014 01/05/15 1159  BP: 117/49  145/57   Pulse: 75  75   Temp: 98.7 F (37.1 C)  98.4 F (36.9 C)   TempSrc: Oral  Oral   Resp: 10 11 11 16   Height:      Weight:      SpO2: 100%  100% 98%    Intake/Output Summary (Last 24 hours) at 01/05/15 1210 Last data filed at 01/05/15 0811  Gross per 24 hour  Intake    680 ml  Output    800 ml  Net   -120 ml    PULM  CTAB CV  RRR GI  soft, NTND VASC  R groin: clean, incision healing; L groin: clean, serous drainage from fem-fem tract, no granulation; both feet viable with doppler signals  Laboratory CBC    Component Value  Date/Time   WBC 8.1 01/05/2015 0440   WBC 6.3 10/01/2014 1434   WBC 8.2 08/26/2012 1300   HGB 8.7* 01/05/2015 0440   HGB 10.6* 10/01/2014 1434   HGB 10.6* 08/26/2012 1300   HCT 26.5* 01/05/2015 0440   HCT 32.8* 10/01/2014 1434   HCT 31.2* 08/26/2012 1300   PLT 215 01/05/2015 0440   PLT 516* 08/26/2012 1300    BMET    Component Value Date/Time   NA 138 01/05/2015 0440   NA 138 08/26/2012 1300   K 4.2 01/05/2015 0440   K 4.0 08/26/2012 1300   CL 101 01/05/2015 0440   CL 108* 08/26/2012 1300   CO2 30 01/05/2015 0440   CO2 24 08/26/2012 1300   GLUCOSE 115* 01/05/2015 0440   GLUCOSE 83 08/26/2012 1300   BUN 12 01/05/2015 0440   BUN 29* 08/26/2012 1300   BUN 4 08/19/2009   CREATININE 0.58 01/05/2015 0440   CREATININE 0.65 10/01/2014 1420   CREATININE 0.79 08/26/2012 1300   CALCIUM 9.1 01/05/2015 0440   CALCIUM 9.9 08/26/2012 1300   GFRNONAA >60 01/05/2015 0440   GFRNONAA >89 07/16/2014 0948   GFRNONAA >60 08/26/2012 1300   GFRAA >60 01/05/2015 0440   GFRAA >89 07/16/2014 0948   GFRAA >60 08/26/2012 [place], MD [facility] [place] Office: [phone] Pager: [phone]  01/05/2015, 12:10 PM

## 2015-01-05 NOTE — Consult Note (Addendum)
WOC wound follow up Wound type: surgical  Measurement: see previous notes Wound EGB:TDVVO, early granulation Drainage (amount, consistency, odor) active lymphatic leak during dressing change, canister 1/4 full with clear yellow fluid Periwound:intact Dressing procedure/placement/frequency: 1pc of black foam used to cover the wound bed, draped, then second "mushroom" of black foam used to accommodate TRAC pad. Sealed at 142mmHG.   WOC will follow along with you for support with VAC dressing changes as needed.  Olyvia Gopal Liane Comber RN,CWOCN [phone]  Addendum: contacted around 1230 that vascular staff had been in to assess wound and removed VAC dressing just applied by [facility] nurse. Second dressing applied with same procedure as above noted. Marica Otter RN,CWOCN

## 2015-01-05 NOTE — Progress Notes (Signed)
[place] NOTE  Pharmacy Consult for TPN Indication: poor NPO intake and severe protein malnutrition s/p multiple GI surgeries in the past  Allergies  Allergen Reactions  . Avelox [Moxifloxacin Hcl In Nacl] Nausea And Vomiting  . Betadine [Povidone Iodine] Itching and Rash  . Alendronate Sodium Nausea And Vomiting and Other (See Comments)    dizziness  . Aspirin Nausea Only  . Codeine Nausea And Vomiting  . Doxycycline Nausea And Vomiting  . Fluconazole Nausea And Vomiting  . Hydrocodone Nausea And Vomiting    GI distress  . Hydrocodone-Acetaminophen Nausea And Vomiting  . Morphine And Related Nausea Only  . Neurontin [Gabapentin] Other (See Comments)    Mood changes   . Nsaids Other (See Comments)    Severe gastritis & perforation - avoid NSAIDs when possible  . Quinolones Hives and Itching  . Sertraline Hcl Nausea And Vomiting and Other (See Comments)    Hallucinations   . Sulfamethoxazole Hives and Itching  . Latex Rash  . Sulfa Antibiotics Rash    Patient Measurements: Height: 5\' 5"  (165.1 cm) Weight: 85 lb 15.7 oz (39 kg) IBW/kg (Calculated) : 57   Vital Signs: Temp: 98.7 F (37.1 C) (09/14 0422) Temp Source: Oral (09/14 0422) BP: 117/49 mmHg (09/14 0422) Pulse Rate: 75 (09/14 0422) Intake/Output from previous day: 09/13 0701 - 09/14 0700 In: 1180 [P.O.:560; I.V.:210; IV Piggyback:50; TPN:360] Out: 1625 [Urine:1600; Drains:25] Intake/Output from this shift:    Labs:  Recent Labs  01/03/15 0250 01/04/15 0525 01/05/15 0440  WBC 8.5 8.1 8.1  HGB 8.8* 8.7* 8.7*  HCT 26.6* 26.6* 26.5*  PLT 175 182 215     Recent Labs  01/04/15 0525 01/05/15 0440  NA 138 138  K 3.9 4.2  CL 104 101  CO2 29 30  GLUCOSE 106* 115*  BUN 8 12  CREATININE 0.58 0.58  CALCIUM 8.8* 9.1  MG 1.9 1.8  PHOS 3.1 3.7  PROT 5.7*  --   ALBUMIN 2.5*  --   AST 15  --   ALT 9*  --   ALKPHOS 60  --   BILITOT 0.3  --   PREALBUMIN 14.5*  --   TRIG 59  --     Estimated Creatinine Clearance: 42 mL/min (by C-G formula based on Cr of 0.58).    Recent Labs  01/04/15 2004 01/04/15 2354 01/05/15 0418  GLUCAP 135* 102* 115*   Insulin Requirements in the past 24 hours:  2 units SSI  Nutritional Goals:  1200-1400 kCal, 65-75 grams of protein per day per RD recommendations on 9/8  Current Nutrition:  Clinimix E 5/15 at 57mL/hr + IVFE 20% at 19mL/hr- provides 48g protein and 1162kcal Resource Breeze 1 container TID between meals Regular diet- ate 50% of breakfast, 100% of lunch and 75% of dinner yesterday, nothing charted yet for today.   Assessment: 61 YOF admitted 9/7 with bilateral groin pain- she had stents placed bilaterally in May and was having drainage from the right groin site after a fluid collection popped 2 days PTA.    Surgeries/Procedures: excision of infected fem-fem BPG, L ax-PFA bypass on 9/9. Multiple abdominal surgeries noted in 2014 as per PMH- ex lap repair of gastric perforation 07/02/2012, omental patch of perforation and gastric leak repair 07/07/2012, repeat ex laps for leak repair in March 2014, small bowel resection, placement of jejunostomy and lysis of adhesions 11/11/2012.  GI:  Noted RD assessment from 9/8 states patient is malnourished. Has been receiving Megace without  an increase in diet, though she has remained on a regular diet with decent intake. Per nutritional assessment, she eats what she can at home. Prealbumin of 14.5 indicates poor nutritional status. She is currently on Bentyl and Librax along with PO PPI. Last BM charted 9/12. Per Dr. Lianne Moris note yesterday, would like to continue TPN for at least 2 weeks to help with incision healing. She may need a follow through study to determine why she has malabsorption issues.  Endo: no history of DM. CBGs 102-132 since starting TPN last evening  Lytes: Na/Cl nml, K 4.2, Phos 3.7, Mag 1.8, Corca ~10.1- no evidence of refeeding. Phos x ca product <55  Renal: SCr  0.58, CrCl ~40-36mL/min  Pulm: 100/1L [place]  Cards: VSS, though HR can be brady. No meds.  Hepatobil: LFTs wnl except low ALT at 9. Albumin low at 2.5, baseline trigs 59.  Neuro: no acute issues  ID:   Vancomycin and ceftazidime for infected left groin with abscess. Vac in place. WBC 8.1, afebrile. Plans to continue antibiotics x1 month per ID recommendations. Vanc trough was low overnight- dose increased to 750mg  IV q12h  Vanc 9/8>> Ceftaz 9/8>>  9/7 groin wound: multiple organisms, none predominant 9/8 L&R groin wound (on zinacef): neg 9/8 blood x2: ngtd 9/9 urine: neg 9/9 L groin abscess (on cefazolin): neg 9/9 L groin wound: neg  Best Practices: Lovenox  TPN Access: IR placed PICC 9/12 TPN start date: 9/12>>   Plan:  -give magnesium sulfate 1g IV x1 to ensure level stays in range -increase Clinimix E 5/15 to goal rate of 68mL/hr + IVFE 20% at 16mL/hr- will provide 66g protein and 1417kcal which meets 100% of needs -continue full multivitamin and trace elements in TPN -continue CBGs and SSI q4h -continue MIVF at [facility] -full TPN labs in the morning as per protocol  Kweku Stankey D. Tiera Mensinger, PharmD, BCPS Clinical Pharmacist Pager: [phone] 01/05/2015 8:02 AM

## 2015-01-06 LAB — COMPREHENSIVE METABOLIC PANEL
ALBUMIN: 2.2 g/dL — AB (ref 3.5–5.0)
ALK PHOS: 50 U/L (ref 38–126)
ALT: 13 U/L — ABNORMAL LOW (ref 14–54)
ANION GAP: 7 (ref 5–15)
AST: 21 U/L (ref 15–41)
BUN: 16 mg/dL (ref 6–20)
CALCIUM: 8.8 mg/dL — AB (ref 8.9–10.3)
CO2: 29 mmol/L (ref 22–32)
Chloride: 102 mmol/L (ref 101–111)
Creatinine, Ser: 0.59 mg/dL (ref 0.44–1.00)
GFR calc non Af Amer: >60 mL/min (ref >60)
GLUCOSE: 118 mg/dL — AB (ref 65–99)
POTASSIUM: 4 mmol/L (ref 3.5–5.1)
SODIUM: 138 mmol/L (ref 135–145)
Total Bilirubin: 0.4 mg/dL (ref 0.3–1.2)
Total Protein: 5.6 g/dL — ABNORMAL LOW (ref 6.5–8.1)

## 2015-01-06 LAB — CBC
HCT: 25.4 % — ABNORMAL LOW (ref 36.0–46.0)
HEMOGLOBIN: 8.3 g/dL — AB (ref 12.0–15.0)
MCH: 27.2 pg (ref 26.0–34.0)
MCHC: 32.7 g/dL (ref 30.0–36.0)
MCV: 83.3 fL (ref 78.0–100.0)
PLATELETS: 235 10*3/uL (ref 150–400)
RBC: 3.05 MIL/uL — ABNORMAL LOW (ref 3.87–5.11)
RDW: 16.4 % — AB (ref 11.5–15.5)
WBC: 8.9 10*3/uL (ref 4.0–10.5)

## 2015-01-06 LAB — MAGNESIUM: Magnesium: 1.7 mg/dL (ref 1.7–2.4)

## 2015-01-06 LAB — GLUCOSE, CAPILLARY
GLUCOSE-CAPILLARY: 97 mg/dL (ref 65–99)
Glucose-Capillary: 120 mg/dL — ABNORMAL HIGH (ref 65–99)
Glucose-Capillary: 123 mg/dL — ABNORMAL HIGH (ref 65–99)
Glucose-Capillary: 83 mg/dL (ref 65–99)
Glucose-Capillary: 97 mg/dL (ref 65–99)
Glucose-Capillary: 98 mg/dL (ref 65–99)

## 2015-01-06 LAB — PHOSPHORUS: PHOSPHORUS: 3.9 mg/dL (ref 2.5–4.6)

## 2015-01-06 LAB — PROTIME-INR
INR: 1.12 (ref 0.00–1.49)
PROTHROMBIN TIME: 14.6 s (ref 11.6–15.2)

## 2015-01-06 MED ORDER — MAGNESIUM SULFATE 2 GM/50ML IV SOLN
2.0000 g | Freq: Once | INTRAVENOUS | Status: AC
  Administered 2015-01-06: 2 g via INTRAVENOUS
  Filled 2015-01-06: qty 50

## 2015-01-06 MED ORDER — TRACE MINERALS CR-CU-MN-SE-ZN 10-1000-500-60 MCG/ML IV SOLN
INTRAVENOUS | Status: AC
  Administered 2015-01-06: 18:00:00 via INTRAVENOUS
  Filled 2015-01-06: qty 1320

## 2015-01-06 MED ORDER — WARFARIN SODIUM 4 MG PO TABS
4.0000 mg | ORAL_TABLET | Freq: Once | ORAL | Status: AC
  Administered 2015-01-06: 4 mg via ORAL
  Filled 2015-01-06: qty 1

## 2015-01-06 MED ORDER — FAT EMULSION 20 % IV EMUL
240.0000 mL | INTRAVENOUS | Status: AC
Start: 2015-01-06 — End: 2015-01-07
  Administered 2015-01-06: 240 mL via INTRAVENOUS
  Filled 2015-01-06: qty 250

## 2015-01-06 NOTE — Progress Notes (Signed)
Physical Therapy Treatment Patient Details Name: Cynthia Bowen MRN: [id number] DOB: 07-22-1945 Today's Date: 01/06/2015    History of Present Illness Adm 9/7 with infected fem-fem BPG; 9/8 I& D bil groin with removal of graft; 9/9 underwent Lt axillary-femoral BPG    PT Comments    Patient did not use an assistive device PTA. Doing better with ambulation and balance, however did not feel secure/strong enough to attempt ambulation without UE support (although did only use support of IV pole in one hand).    Follow Up Recommendations  Other (comment);Home health PT;Supervision for mobility/OOB (talk of needing LTAC for Abx and nutrition)     Equipment Recommendations  Rolling walker with 5" wheels    Recommendations for Other Services       Precautions / Restrictions Precautions Precautions: Fall    Mobility  Bed Mobility                  Transfers Overall transfer level: Needs assistance Equipment used: None Transfers: Sit to/from Stand Sit to Stand: Supervision         General transfer comment: pt feels uneasy standing without UE support; reaching for surfaces to support herself  Ambulation/Gait Ambulation/Gait assistance: Supervision Ambulation Distance (Feet): 600 Feet Assistive device:  (push IV pole) Gait Pattern/deviations: Step-through pattern;Decreased stride length     General Gait Details: Patient able to push IV pole with decr stride length and velocity. She did not feel secure enough to attempt walking with no device.   Stairs            Wheelchair Mobility    Modified Rankin (Stroke Patients Only)       Balance Overall balance assessment: Needs assistance   Sitting balance-Leahy Scale: Normal     Standing balance support: No upper extremity supported Standing balance-Leahy Scale: Good Standing balance comment: see standing ex's           Rhomberg - Eyes Closed: 30 (close supervision; normal sway and reactions)         Cognition Arousal/Alertness: Awake/alert Behavior During Therapy: WFL for tasks assessed/performed Overall Cognitive Status: Within Functional Limits for tasks assessed                      Exercises General Exercises - Lower Extremity Hip ABduction/ADduction: AROM;Both;5 reps;Standing (alternating for incr balance challenge) Hip Flexion/Marching: AROM;Both;10 reps;Standing Toe Raises: AROM;Both;5 reps;Standing Heel Raises: AROM;Both;5 reps;Standing Mini-Sqauts: AROM;Both;10 reps;Standing    General Comments General comments (skin integrity, edema, etc.): No unsteadiness with standing exercises with pt gaining confidence (completed after ambulation). Agreed to attempt walking without device next visit      Pertinent Vitals/Pain Pain Assessment: 0-10 Pain Score: 4  Pain Location: Lt groin Pain Intervention(s): Limited activity within patient's tolerance;Monitored during session;Repositioned    Home Living                      Prior Function            PT Goals (current goals can now be found in the care plan section) Acute Rehab PT Goals Patient Stated Goal: not stated PT Goal Formulation: With patient Time For Goal Achievement: 01/08/15 Potential to Achieve Goals: Good Progress towards PT goals: Progressing toward goals    Frequency  Min 3X/week    PT Plan Current plan remains appropriate    Co-evaluation             End of Session   Activity Tolerance:  Patient tolerated treatment well Patient left: in chair;with call bell/phone within reach     Time: 1447-1517 PT Time Calculation (min) (ACUTE ONLY): 30 min  Charges:  $Gait Training: 8-22 mins $Therapeutic Activity: 8-22 mins                    G Codes:      Nilton Lave 01/16/15, 4:31 PM  Pager [phone]

## 2015-01-06 NOTE — Progress Notes (Signed)
[place] NOTE  Pharmacy Consult for TPN Indication: poor NPO intake and severe protein malnutrition s/p multiple GI surgeries in the past  Allergies  Allergen Reactions  . Avelox [Moxifloxacin Hcl In Nacl] Nausea And Vomiting  . Betadine [Povidone Iodine] Itching and Rash  . Alendronate Sodium Nausea And Vomiting and Other (See Comments)    dizziness  . Aspirin Nausea Only  . Codeine Nausea And Vomiting  . Doxycycline Nausea And Vomiting  . Fluconazole Nausea And Vomiting  . Hydrocodone Nausea And Vomiting    GI distress  . Hydrocodone-Acetaminophen Nausea And Vomiting  . Morphine And Related Nausea Only  . Neurontin [Gabapentin] Other (See Comments)    Mood changes   . Nsaids Other (See Comments)    Severe gastritis & perforation - avoid NSAIDs when possible  . Quinolones Hives and Itching  . Sertraline Hcl Nausea And Vomiting and Other (See Comments)    Hallucinations   . Sulfamethoxazole Hives and Itching  . Latex Rash  . Sulfa Antibiotics Rash    Patient Measurements: Height: 5\' 5"  (165.1 cm) Weight: 85 lb 15.7 oz (39 kg) IBW/kg (Calculated) : 57   Vital Signs: Temp: 98.2 F (36.8 C) (09/15 0506) Temp Source: Oral (09/15 0506) BP: 110/47 mmHg (09/15 0506) Pulse Rate: 79 (09/15 0506) Intake/Output from previous day: 09/14 0701 - 09/15 0700 In: 2438.4 [P.O.:480; I.V.:262; IV Piggyback:350; TPN:1346.4] Out: 2700 [Urine:2700] Intake/Output from this shift:    Labs:  Recent Labs  01/04/15 0525 01/05/15 0440 01/06/15 0536  WBC 8.1 8.1 8.9  HGB 8.7* 8.7* 8.3*  HCT 26.6* 26.5* 25.4*  PLT 182 215 235  INR  --   --  1.12     Recent Labs  01/04/15 0525 01/05/15 0440 01/06/15 0536  NA 138 138 138  K 3.9 4.2 4.0  CL 104 101 102  CO2 29 30 29   GLUCOSE 106* 115* 118*  BUN 8 12 16   CREATININE 0.58 0.58 0.59  CALCIUM 8.8* 9.1 8.8*  MG 1.9 1.8 1.7  PHOS 3.1 3.7 3.9  PROT 5.7*  --  5.6*  ALBUMIN 2.5*  --  2.2*  AST 15  --  21   ALT 9*  --  13*  ALKPHOS 60  --  50  BILITOT 0.3  --  0.4  PREALBUMIN 14.5*  --   --   TRIG 59  --   --    Estimated Creatinine Clearance: 42 mL/min (by C-G formula based on Cr of 0.59).    Recent Labs  01/06/15 0021 01/06/15 0408 01/06/15 0807  GLUCAP 83 97 123*   Insulin Requirements in the past 24 hours:  3 units SSI  Nutritional Goals:  1200-1400 kCal, 65-75 grams of protein per day per RD recommendations on 9/8  Current Nutrition:  -Clinimix E 5/15 to goal rate of 85mL/hr + IVFE 20% at 69mL/hr- will provide 66g protein and 1417kcal which meets 100% of needs -Resource Breeze 1 container TID between meals -Regular diet- ate 25% of breakfast, 50% of lunch and 75% of dinner yesterday, nothing charted yet for today.   Assessment: 45 YOF admitted 9/7 with bilateral groin pain- she had stents placed bilaterally in May and was having drainage from the right groin site after a fluid collection popped 2 days PTA.    Surgeries/Procedures: excision of infected fem-fem BPG, L ax-PFA bypass on 9/9. Multiple abdominal surgeries noted in 2014 as per PMH- ex lap repair of gastric perforation 07/02/2012, omental patch of perforation and  gastric leak repair 07/07/2012, repeat ex laps for leak repair in March 2014, small bowel resection, placement of jejunostomy and lysis of adhesions 11/11/2012. During this admission, she had excision of fem-fem graft done 12/31/2014  GI:  Noted RD assessment from 9/8 states patient is malnourished. Has been receiving Megace without an increase in diet, though she has remained on a regular diet with decent intake. Per nutritional assessment, she eats what she can at home. Prealbumin of 14.5 indicates poor nutritional status. She is currently on Bentyl and Librax along with PO PPI. Last BM charted 9/12. Per Dr. Lianne Moris note would like to continue TPN for 2-4 weeks to help with incision healing. She may need a follow through study to determine why she has malabsorption  issues.  Endo: no history of DM. CBGs/24h 83-167  Lytes: Na/Cl nml, K 4, Phos 3.9, Mag 1.7 (dropped slightly despite repletion yesterday), Corca ~10.1. Phos x ca product <55. She has had no evidence of refeeding.   Renal: SCr 0.59, CrCl ~40-47mL/min- stable. MIVF at [place]: 100/RA  Cards: VSS, though HR can be brady. No meds.  Hepatobil: LFTs wnl except low ALT at 13. Albumin low at 2.2, baseline trigs 59.  Neuro: no acute issues  ID:   Vancomycin and ceftazidime for infected left groin with abscess. Vac in place. WBC 8.9, afebrile. Plans to continue antibiotics x1 month per ID recommendations.   Vanc 9/8>> Ceftaz 9/8>>  9/13 VT = 6 on 1g q24h>changed to 750mg  q12h  9/7 groin wound: multiple organisms, none predominant 9/8 L&R groin wound (on zinacef): neg 9/8 blood x2: ngeg 9/9 urine: neg 9/9 L groin abscess (on cefazolin): neg 9/9 L groin wound: neg  AC/Heme: stared on full dose Lovenox + warfarin for tenuous perfusion of left leg via bypass- using as a patency adjunct. INR 1.12, Lovenox 40mg  q12h  Best Practices: Lovenox + warfarin  TPN Access: IR placed PICC 9/12 TPN start date: 9/12>>   Plan:  -magnesium sulfate 2g IV x1 to keep a normal level -continue Clinimix E 5/15 at 69mL/hr + IVFE 20% at 64mL/hr- provides 66g protein and 1417kcal which meets 100% of needs -continue full multivitamin and trace elements in TPN -discontinue CBGs and SSI as CBGs have been <180 even when on TPN -continue MIVF at [facility] -BMET, mag and phos in the morning- if remains normal, can likely go to 2x weekly full TPN labs  Kerry Odonohue D. Gabryella Murfin, PharmD, BCPS Clinical Pharmacist Pager: [phone] 01/06/2015 8:23 AM

## 2015-01-06 NOTE — Progress Notes (Signed)
   Daily Progress Note  Assessment/Planning: POD #6 s/p Exc infected fem-fem Bypass, L ax-PFA bypass   Coumadin with Lovenox bridge as patency adjunct  Continue IV abx  Check L groin tomorrow at [facility] change  Cont TPN  Suspect will be here until next week loading coumadin and for wound care.   Once INR therapeutic and some evidence of granulation in L groin, ok with trsf to long-term care for rest of one month of IV abx and IV TPN  Subjective  - 6 Days Post-Op  No complaints  Objective Filed Vitals:   01/06/15 0800 01/06/15 1053 01/06/15 1200 01/06/15 1237  BP:  101/50    Pulse:  70    Temp:  98 F (36.7 C)    TempSrc:  Oral    Resp: 13 13 17 17   Height:      Weight:      SpO2: 100% 100% 98% 98%    Intake/Output Summary (Last 24 hours) at 01/06/15 1242 Last data filed at 01/06/15 1236  Gross per 24 hour  Intake 2438.35 ml  Output   2700 ml  Net -261.65 ml    PULM  CTAB CV  RRR GI  soft, NTND VASC  L groin VAC adherent, R groin clean, no drainage, viable feet   Laboratory CBC    Component Value Date/Time   WBC 8.9 01/06/2015 0536   WBC 6.3 10/01/2014 1434   WBC 8.2 08/26/2012 1300   HGB 8.3* 01/06/2015 0536   HGB 10.6* 10/01/2014 1434   HGB 10.6* 08/26/2012 1300   HCT 25.4* 01/06/2015 0536   HCT 32.8* 10/01/2014 1434   HCT 31.2* 08/26/2012 1300   PLT 235 01/06/2015 0536   PLT 516* 08/26/2012 1300    BMET    Component Value Date/Time   NA 138 01/06/2015 0536   NA 138 08/26/2012 1300   K 4.0 01/06/2015 0536   K 4.0 08/26/2012 1300   CL 102 01/06/2015 0536   CL 108* 08/26/2012 1300   CO2 29 01/06/2015 0536   CO2 24 08/26/2012 1300   GLUCOSE 118* 01/06/2015 0536   GLUCOSE 83 08/26/2012 1300   BUN 16 01/06/2015 0536   BUN 29* 08/26/2012 1300   BUN 4 08/19/2009   CREATININE 0.59 01/06/2015 0536   CREATININE 0.65 10/01/2014 1420   CREATININE 0.79 08/26/2012 1300   CALCIUM 8.8* 01/06/2015 0536   CALCIUM 9.9 08/26/2012 1300   GFRNONAA >60  01/06/2015 0536   GFRNONAA >89 07/16/2014 0948   GFRNONAA >60 08/26/2012 1300   GFRAA >60 01/06/2015 0536   GFRAA >89 07/16/2014 0948   GFRAA >60 08/26/2012 McDonald, MD [facility] Office: [phone] Pager: [phone]  01/06/2015, 12:42 PM

## 2015-01-06 NOTE — Progress Notes (Signed)
ANTICOAGULATION CONSULT NOTE - Follow-up  Pharmacy Consult for Lovenox and Coumadin Indication: tenuous blood flow to left leg via axilloprofunda bypass, anticoagulation as an adjunct  Patient Measurements: Height: 5\' 5"  (165.1 cm) Weight: 85 lb 15.7 oz (39 kg) IBW/kg (Calculated) : 57  Labs:  Recent Labs  01/04/15 0525 01/05/15 0440 01/06/15 0536  HGB 8.7* 8.7* 8.3*  HCT 26.6* 26.5* 25.4*  PLT 182 215 235  LABPROT  --   --  14.6  INR  --   --  1.12  CREATININE 0.58 0.58 0.59    Estimated Creatinine Clearance: 42 mL/min (by C-G formula based on Cr of 0.59).  Assessment: 11 yof continues on lovenox + warfarin as adjunct tonight for tenuous blood flow to left leg via axilloprofunda bypass. INR after 1 dose of warfarin is 1.12. H/H low and platelets are WNL. Her lovenox dose remains appropriate. No bleeding noted.   Goal of Therapy:  INR 2-3 Anti-Xa level 0.6-1 units/ml 4hrs after LMWH dose given Monitor platelets by anticoagulation protocol: Yes   Plan:  - Continue lovenox 40mg  SQ Q12H - Repeat warfarin 4mg  PO x 1 tonight - Daily INR, CBC  Salome Arnt, PharmD, BCPS Pager # [phone] 01/06/2015 12:11 PM

## 2015-01-07 LAB — CBC
HEMATOCRIT: 28.4 % — AB (ref 36.0–46.0)
HEMOGLOBIN: 9.2 g/dL — AB (ref 12.0–15.0)
MCH: 27 pg (ref 26.0–34.0)
MCHC: 32.4 g/dL (ref 30.0–36.0)
MCV: 83.3 fL (ref 78.0–100.0)
Platelets: 320 10*3/uL (ref 150–400)
RBC: 3.41 MIL/uL — AB (ref 3.87–5.11)
RDW: 16.6 % — AB (ref 11.5–15.5)
WBC: 10.1 10*3/uL (ref 4.0–10.5)

## 2015-01-07 LAB — BASIC METABOLIC PANEL
ANION GAP: 9 (ref 5–15)
BUN: 18 mg/dL (ref 6–20)
CALCIUM: 9.1 mg/dL (ref 8.9–10.3)
CO2: 27 mmol/L (ref 22–32)
CREATININE: 0.65 mg/dL (ref 0.44–1.00)
Chloride: 101 mmol/L (ref 101–111)
GFR calc non Af Amer: >60 mL/min (ref >60)
GLUCOSE: 88 mg/dL (ref 65–99)
Potassium: 4.6 mmol/L (ref 3.5–5.1)
SODIUM: 137 mmol/L (ref 135–145)

## 2015-01-07 LAB — PHOSPHORUS: Phosphorus: 3.9 mg/dL (ref 2.5–4.6)

## 2015-01-07 LAB — GLUCOSE, CAPILLARY
GLUCOSE-CAPILLARY: 70 mg/dL (ref 65–99)
GLUCOSE-CAPILLARY: 114 mg/dL — AB (ref 65–99)
Glucose-Capillary: 93 mg/dL (ref 65–99)
Glucose-Capillary: 45 mg/dL — ABNORMAL LOW (ref 65–99)
Glucose-Capillary: 105 mg/dL — ABNORMAL HIGH (ref 65–99)
Glucose-Capillary: 114 mg/dL — ABNORMAL HIGH (ref 65–99)

## 2015-01-07 LAB — PROTIME-INR
INR: 1.14 (ref 0.00–1.49)
Prothrombin Time: 14.8 seconds (ref 11.6–15.2)

## 2015-01-07 LAB — VANCOMYCIN, TROUGH: VANCOMYCIN TR: 15 ug/mL (ref 10.0–20.0)

## 2015-01-07 LAB — MAGNESIUM: Magnesium: 1.9 mg/dL (ref 1.7–2.4)

## 2015-01-07 MED ORDER — WARFARIN SODIUM 5 MG PO TABS: 6.0000 mg | ORAL_TABLET | Freq: Once | ORAL | Status: DC

## 2015-01-07 MED ORDER — TRACE MINERALS CR-CU-MN-SE-ZN 10-1000-500-60 MCG/ML IV SOLN
INTRAVENOUS | Status: AC
  Administered 2015-01-07: 17:00:00 via INTRAVENOUS
  Filled 2015-01-07: qty 1300

## 2015-01-07 MED ORDER — FAT EMULSION 20 % IV EMUL
240.0000 mL | INTRAVENOUS | Status: AC
  Administered 2015-01-07: 240 mL via INTRAVENOUS
  Filled 2015-01-07: qty 250

## 2015-01-07 MED ORDER — INSULIN ASPART 100 UNIT/ML ~~LOC~~ SOLN
0.0000 [IU] | SUBCUTANEOUS | Status: DC
  Administered 2015-01-08: 7 [IU] via SUBCUTANEOUS
  Administered 2015-01-08: 1 [IU] via SUBCUTANEOUS

## 2015-01-07 MED ORDER — SODIUM CHLORIDE 0.9 % IJ SOLN
10.0000 mL | INTRAMUSCULAR | Status: DC | PRN
Start: 2015-01-07 — End: 2015-01-12
  Administered 2015-01-09 – 2015-01-10 (×2): 20 mL
  Administered 2015-01-11: 10 mL
  Filled 2015-01-07 (×3): qty 40

## 2015-01-07 NOTE — Care Management Important Message (Signed)
Important Message  Patient Details  Name: Cynthia Bowen MRN: [id number] Date of Birth: [birth year]   Medicare Important Message Given:  Yes-third notification given    Nathen May 01/07/2015, 11:21 AM

## 2015-01-07 NOTE — Progress Notes (Signed)
[place] NOTE  Pharmacy Consult for TPN Indication: poor NPO intake and severe protein malnutrition s/p multiple GI surgeries in the past  Allergies  Allergen Reactions  . Avelox [Moxifloxacin Hcl In Nacl] Nausea And Vomiting  . Betadine [Povidone Iodine] Itching and Rash  . Alendronate Sodium Nausea And Vomiting and Other (See Comments)    dizziness  . Aspirin Nausea Only  . Codeine Nausea And Vomiting  . Doxycycline Nausea And Vomiting  . Fluconazole Nausea And Vomiting  . Hydrocodone Nausea And Vomiting    GI distress  . Hydrocodone-Acetaminophen Nausea And Vomiting  . Morphine And Related Nausea Only  . Neurontin [Gabapentin] Other (See Comments)    Mood changes   . Nsaids Other (See Comments)    Severe gastritis & perforation - avoid NSAIDs when possible  . Quinolones Hives and Itching  . Sertraline Hcl Nausea And Vomiting and Other (See Comments)    Hallucinations   . Sulfamethoxazole Hives and Itching  . Latex Rash  . Sulfa Antibiotics Rash    Patient Measurements: Height: 5\' 5"  (165.1 cm) Weight: 85 lb 15.7 oz (39 kg) IBW/kg (Calculated) : 57   Vital Signs: Temp: 99.2 F (37.3 C) (09/16 0419) Temp Source: Oral (09/16 0419) BP: 95/46 mmHg (09/16 0419) Pulse Rate: 68 (09/16 0419) Intake/Output from previous day: 09/15 0701 - 09/16 0700 In: 240 [P.O.:240] Out: 2600 [Urine:2600] Intake/Output from this shift: Total I/O In: 120 [P.O.:120] Out: -   Labs:  Recent Labs  01/05/15 0440 01/06/15 0536 01/07/15 0630  WBC 8.1 8.9 10.1  HGB 8.7* 8.3* 9.2*  HCT 26.5* 25.4* 28.4*  PLT 215 235 320  INR  --  1.12 1.14     Recent Labs  01/05/15 0440 01/06/15 0536 01/07/15 0630  NA 138 138 137  K 4.2 4.0 4.6  CL 101 102 101  CO2 30 29 27   GLUCOSE 115* 118* 88  BUN 12 16 18   CREATININE 0.58 0.59 0.65  CALCIUM 9.1 8.8* 9.1  MG 1.8 1.7 1.9  PHOS 3.7 3.9 3.9  PROT  --  5.6*  --   ALBUMIN  --  2.2*  --   AST  --  21  --   ALT  --   13*  --   ALKPHOS  --  50  --   BILITOT  --  0.4  --    Estimated Creatinine Clearance: 42 mL/min (by C-G formula based on Cr of 0.65).    Recent Labs  01/07/15 0414 01/07/15 0817 01/07/15 0856  GLUCAP 93 45* 70   Insulin Requirements in the past 24 hours:  none  Nutritional Goals:  1200-1400 kCal, 65-75 grams of protein per day per RD recommendations on 9/8  Current Nutrition:  -Clinimix E 5/15 to goal rate of 45mL/hr + IVFE 20% at 23mL/hr- will provide 66g protein and 1417kcal which meets 100% of needs -Resource Breeze 1 container TID between meals -Regular diet- ate 10% of breakfast today, 50% of lunch and 75% of dinner yesterday  Assessment: 34 YOF admitted 9/7 with bilateral groin pain- she had stents placed bilaterally in May and was having drainage from the right groin site after a fluid collection popped 2 days PTA.    Surgeries/Procedures: excision of infected fem-fem BPG, L ax-PFA bypass on 9/9. Multiple abdominal surgeries noted in 2014 as per PMH- ex lap repair of gastric perforation 07/02/2012, omental patch of perforation and gastric leak repair 07/07/2012, repeat ex laps for leak repair in March 2014,  small bowel resection, placement of jejunostomy and lysis of adhesions 11/11/2012. During this admission, she had excision of fem-fem graft done 12/31/2014  GI:  Noted RD assessment from 9/8 states patient is malnourished. Has been receiving Megace without an increase in diet, though she has remained on a regular diet with decent intake. Per nutritional assessment, she eats what she can at home. Prealbumin of 14.5 indicates poor nutritional status. She is currently on Bentyl and Librax along with PO PPI. Last BM charted 9/12. Per Dr. Lianne Moris note would like to continue TPN for 2-4 weeks to help with incision healing. She may need a follow through study to determine why she has malabsorption issues.  Endo: no history of DM. CBGs/24h 70-97  Lytes: Na/Cl nml, K 4.6, Phos 3.9,  Mag 1.9 (stable after repletion), CorCa ~10.5. Phos x ca product <55. She has had no evidence of refeeding.   Renal: SCr 0.65, CrCl ~40-18mL/min- stable. MIVF at [place]: 100/RA  Cards: VSS, though HR can be brady. No meds.  Hepatobil: LFTs wnl except low ALT at 13. Albumin low at 2.2, baseline trigs 59.  Neuro: no acute issues  ID:   Vancomycin and ceftazidime for infected left groin with abscess. Vac in place. WBC 10.1, afebrile. Plans to continue antibiotics x1 month per ID recommendations.   Vanc 9/8>> Ceftaz 9/8>>  9/13 VT = 6 on 1g q24h>changed to 750mg  q12h  9/7 groin wound: multiple organisms, none predominant 9/8 L&R groin wound (on zinacef): neg 9/8 blood x2: neg 9/9 urine: neg 9/9 L groin abscess (on cefazolin): neg 9/9 L groin wound: neg  AC/Heme: stared on full dose Lovenox + warfarin for tenuous perfusion of left leg via bypass- using as a patency adjunct. INR 1.14, Lovenox 40mg  q12h  Best Practices: Lovenox + warfarin  TPN Access: IR placed PICC 9/12 TPN start date: 9/12>>   Plan:  Change to cyclic TPN in anticipation of long-term TPN provisions.  Will give 18 hour cycle today - if tolerates, proceed to 12 hour cycle on 9/17. Clinimix E 5/15 1300 ml over 18 hours + 20% IVFE 240 ml over 18 hours.  This provides 1403 kcal and 65 g protein to meet 100% of her estimated needs. Continue full multivitamin and trace elements in TPN Will restart CBG and SSI with cyclic TPN tonight to ensure she tolerates from glycemic control perspective.  CBG and SSI times are intentionally scheduled for cyclic TPN monitoring. Continue M[facility]F at [facility], mag and phos in the morning  Legrand Como, Pharm.D., BCPS, AAHIVP Clinical Pharmacist Phone: [phone] or [phone] 01/07/2015, 10:26 AM

## 2015-01-07 NOTE — Consult Note (Addendum)
WOC follow-up: Dressing kit in room has been used; patient states that Dr Geryl Councilman was in early this am to remove dressing, assess wound, and apply new Vac dressing.  Bedside nurse confirms that dressing was changed earlier.  Black sponge intact to left groin with good seal to 118m cont suction.  Mod amt yellow drainage in the cannister.  Plan for dressing change Mon if patient still in the hospital at that time. DJulien GirtMSN, RN, [place]Br[place]

## 2015-01-07 NOTE — Progress Notes (Signed)
   Daily Progress Note  Assessment/Planning: POD #7 s/p Exc infected fem-fem BPG, L ax-PFA bypass   Coumadin load in progress with Lovenox bridge  L groin with some granulation today: continue VAC sponge  Cont IV abx and TPN for 1 month total  Pt's PO intake remains limited.  Will recheck LFT per TPN protocol.  Recheck nutrition labs in 1-2 weeks.  Suspect patient will want to go by the time the coumadin is loaded.  Can start looking at placement for the early part of this coming week  Subjective  - 7 Days Post-Op  Getting frustrated with being in hospital  Objective Filed Vitals:   01/07/15 0314 01/07/15 0419 01/07/15 0536 01/07/15 0745  BP:  95/46    Pulse:  68    Temp:  99.2 F (37.3 C)    TempSrc:  Oral    Resp: 12 16  98  Height:      Weight:      SpO2: 100%  100% 96%    Intake/Output Summary (Last 24 hours) at 01/07/15 1200 Last data filed at 01/07/15 0943  Gross per 24 hour  Intake    360 ml  Output   2600 ml  Net  -2240 ml    PULM  CTAB CV  RRR GI  soft, NTND VASC  R groin: incision clean, healing, L groin: clean, some amount of granulation, +serous drainage, both legs viable  Laboratory CBC    Component Value Date/Time   WBC 10.1 01/07/2015 0630   WBC 6.3 10/01/2014 1434   WBC 8.2 08/26/2012 1300   HGB 9.2* 01/07/2015 0630   HGB 10.6* 10/01/2014 1434   HGB 10.6* 08/26/2012 1300   HCT 28.4* 01/07/2015 0630   HCT 32.8* 10/01/2014 1434   HCT 31.2* 08/26/2012 1300   PLT 320 01/07/2015 0630   PLT 516* 08/26/2012 1300    BMET    Component Value Date/Time   NA 137 01/07/2015 0630   NA 138 08/26/2012 1300   K 4.6 01/07/2015 0630   K 4.0 08/26/2012 1300   CL 101 01/07/2015 0630   CL 108* 08/26/2012 1300   CO2 27 01/07/2015 0630   CO2 24 08/26/2012 1300   GLUCOSE 88 01/07/2015 0630   GLUCOSE 83 08/26/2012 1300   BUN 18 01/07/2015 0630   BUN 29* 08/26/2012 1300   BUN 4 08/19/2009   CREATININE 0.65 01/07/2015 0630   CREATININE 0.65  10/01/2014 1420   CREATININE 0.79 08/26/2012 1300   CALCIUM 9.1 01/07/2015 0630   CALCIUM 9.9 08/26/2012 1300   GFRNONAA >60 01/07/2015 0630   GFRNONAA >89 07/16/2014 0948   GFRNONAA >60 08/26/2012 1300   GFRAA >60 01/07/2015 0630   GFRAA >89 07/16/2014 0948   GFRAA >60 08/26/2012 [place], MD [facility]: [phone] Pager: [phone]  01/07/2015, 12:00 PM

## 2015-01-07 NOTE — Progress Notes (Signed)
ANTICOAGULATION and ANTIBIOTIC CONSULT NOTE - Follow-up  Pharmacy Consult for Lovenox and Coumadin; Vancomycin Indication: tenuous blood flow to left leg via axilloprofunda bypass, anticoagulation as an adjunct; wound infection  Patient Measurements: Height: 5\' 5"  (165.1 cm) Weight: 85 lb 15.7 oz (39 kg) IBW/kg (Calculated) : 57  Labs:  Recent Labs  01/05/15 0440 01/06/15 0536 01/07/15 0630  HGB 8.7* 8.3* 9.2*  HCT 26.5* 25.4* 28.4*  PLT 215 235 320  LABPROT  --  14.6 14.8  INR  --  1.12 1.14  CREATININE 0.58 0.59 0.65    Estimated Creatinine Clearance: 42 mL/min (by C-G formula based on Cr of 0.65).  Assessment:   Full-dose Lovenox and Coumadin begun 9/14 pm as adjunct tonight for tenuous blood flow to left leg via axilloprofunda bypass.  Weight 39 kg. Expected sensitivity to Coumadin doses due to low body weight, albumin 2.5 and varialbe PO intake, but INR is only 1.14 after Coumadin 4 mg daily x 2 doses.      Day # 9 Vancomycin and Ceftazidime for infected graft, removed 12/31/14. Has wound VAC. Cultures negative. Planning about 1 month of antibiotics per ID 01/03/15, signed off.  Vancomycin regimen adjusted on 9/13 after low trough level of 6 mcg/ml. Trough level today is 15 mcg/ml, at goal.   Goal of Therapy:  INR 2-3 Anti-Xa level 0.6-1 units/ml 4hrs after LMWH dose given Vancomycin trough levels 15-20 mcg/ml Monitor platelets by anticoagulation protocol: Yes   Plan:   Continue Lovenox to 40 mg sq q12hrs (~ 1 mg/kg/dose).  Coumadin 6 mg x 1 tonight.  Daily PT/INR.  Intermittent CBC and bmet.  Has TPN labs on Mondays and Thursdays.  Continue Vancomycin 750 mg IV q12hrs.  Also on Ceftazidime 2 grams IV q12hrs.  Arty Baumgartner, [place] Pager: [phone] 01/07/2015,1:16 PM

## 2015-01-08 LAB — CBC
HEMATOCRIT: 25.7 % — AB (ref 36.0–46.0)
HEMOGLOBIN: 8.3 g/dL — AB (ref 12.0–15.0)
MCH: 27 pg (ref 26.0–34.0)
MCHC: 32.3 g/dL (ref 30.0–36.0)
MCV: 83.7 fL (ref 78.0–100.0)
Platelets: 298 10*3/uL (ref 150–400)
RBC: 3.07 MIL/uL — ABNORMAL LOW (ref 3.87–5.11)
RDW: 16.7 % — AB (ref 11.5–15.5)
WBC: 9.9 10*3/uL (ref 4.0–10.5)

## 2015-01-08 LAB — COMPREHENSIVE METABOLIC PANEL
ALT: 13 U/L — AB (ref 14–54)
AST: 19 U/L (ref 15–41)
Albumin: 2.7 g/dL — ABNORMAL LOW (ref 3.5–5.0)
Alkaline Phosphatase: 55 U/L (ref 38–126)
Anion gap: 6 (ref 5–15)
BILIRUBIN TOTAL: 0.3 mg/dL (ref 0.3–1.2)
BUN: 18 mg/dL (ref 6–20)
CO2: 28 mmol/L (ref 22–32)
CREATININE: 0.62 mg/dL (ref 0.44–1.00)
Calcium: 9.3 mg/dL (ref 8.9–10.3)
Chloride: 105 mmol/L (ref 101–111)
Glucose, Bld: 99 mg/dL (ref 65–99)
Potassium: 4.4 mmol/L (ref 3.5–5.1)
Sodium: 139 mmol/L (ref 135–145)
TOTAL PROTEIN: 6.3 g/dL — AB (ref 6.5–8.1)

## 2015-01-08 LAB — GLUCOSE, CAPILLARY
GLUCOSE-CAPILLARY: 304 mg/dL — AB (ref 65–99)
GLUCOSE-CAPILLARY: 532 mg/dL — AB (ref 65–99)
GLUCOSE-CAPILLARY: 129 mg/dL — AB (ref 65–99)
Glucose-Capillary: 110 mg/dL — ABNORMAL HIGH (ref 65–99)
Glucose-Capillary: 110 mg/dL — ABNORMAL HIGH (ref 65–99)
Glucose-Capillary: 93 mg/dL (ref 65–99)
Glucose-Capillary: 97 mg/dL (ref 65–99)

## 2015-01-08 LAB — PROTIME-INR
INR: 1.21 (ref 0.00–1.49)
PROTHROMBIN TIME: 15.5 s — AB (ref 11.6–15.2)

## 2015-01-08 LAB — MAGNESIUM: MAGNESIUM: 1.9 mg/dL (ref 1.7–2.4)

## 2015-01-08 LAB — PHOSPHORUS: PHOSPHORUS: 4 mg/dL (ref 2.5–4.6)

## 2015-01-08 MED ORDER — TRACE MINERALS CR-CU-MN-SE-ZN 10-1000-500-60 MCG/ML IV SOLN
INTRAVENOUS | Status: AC
  Administered 2015-01-08: 18:00:00 via INTRAVENOUS
  Filled 2015-01-08: qty 1300

## 2015-01-08 MED ORDER — FAT EMULSION 20 % IV EMUL
240.0000 mL | INTRAVENOUS | Status: AC
  Administered 2015-01-08: 240 mL via INTRAVENOUS
  Filled 2015-01-08: qty 250

## 2015-01-08 MED ORDER — WARFARIN SODIUM 5 MG PO TABS
6.0000 mg | ORAL_TABLET | Freq: Once | ORAL | Status: AC
  Administered 2015-01-08: 6 mg via ORAL
  Filled 2015-01-08 (×2): qty 1

## 2015-01-08 MED ORDER — OXYCODONE-ACETAMINOPHEN 5-325 MG PO TABS
1.0000 | ORAL_TABLET | ORAL | Status: DC | PRN
  Administered 2015-01-08 – 2015-01-10 (×2): 2 via ORAL
  Administered 2015-01-10: 1 via ORAL
  Administered 2015-01-10 – 2015-01-12 (×6): 2 via ORAL
  Administered 2015-01-12: 1 via ORAL
  Administered 2015-01-12: 2 via ORAL
  Filled 2015-01-08 (×9): qty 2
  Filled 2015-01-08: qty 1
  Filled 2015-01-08: qty 2

## 2015-01-08 NOTE — Progress Notes (Signed)
[place] NOTE  Pharmacy Consult for TPN Indication: poor NPO intake and severe protein malnutrition s/p multiple GI surgeries in the past  Allergies  Allergen Reactions  . Avelox [Moxifloxacin Hcl In Nacl] Nausea And Vomiting  . Betadine [Povidone Iodine] Itching and Rash  . Alendronate Sodium Nausea And Vomiting and Other (See Comments)    dizziness  . Aspirin Nausea Only  . Codeine Nausea And Vomiting  . Doxycycline Nausea And Vomiting  . Fluconazole Nausea And Vomiting  . Hydrocodone Nausea And Vomiting    GI distress  . Hydrocodone-Acetaminophen Nausea And Vomiting  . Morphine And Related Nausea Only  . Neurontin [Gabapentin] Other (See Comments)    Mood changes   . Nsaids Other (See Comments)    Severe gastritis & perforation - avoid NSAIDs when possible  . Quinolones Hives and Itching  . Sertraline Hcl Nausea And Vomiting and Other (See Comments)    Hallucinations   . Sulfamethoxazole Hives and Itching  . Latex Rash  . Sulfa Antibiotics Rash    Patient Measurements: Height: 5\' 5"  (165.1 cm) Weight: 85 lb 15.7 oz (39 kg) IBW/kg (Calculated) : 57   Vital Signs: Temp: 98.7 F (37.1 C) (09/17 0431) Temp Source: Oral (09/17 0431) BP: 91/52 mmHg (09/17 0431) Pulse Rate: 63 (09/17 0431) Intake/Output from previous day: 09/16 0701 - 09/17 0700 In: 360 [P.O.:360] Out: 2200 [Urine:2200] Intake/Output from this shift:    Labs:  Recent Labs  01/06/15 0536 01/07/15 0630 01/08/15 0410  WBC 8.9 10.1 9.9  HGB 8.3* 9.2* 8.3*  HCT 25.4* 28.4* 25.7*  PLT 235 320 298  INR 1.12 1.14 1.Cynthia     Recent Labs  01/06/15 0536 01/07/15 0630 01/08/15 0410  NA 138 137 139  K 4.0 4.6 4.4  CL 102 101 105  CO2 29 27 28   GLUCOSE 118* 88 99  BUN 16 18 18   CREATININE 0.59 0.65 0.62  CALCIUM 8.8* 9.1 9.3  MG 1.7 1.9 1.9  PHOS 3.9 3.9 4.0  PROT 5.6*  --  6.3*  ALBUMIN 2.2*  --  2.7*  AST Cynthia  --  19  ALT 13*  --  13*  ALKPHOS 50  --  55  BILITOT  0.4  --  0.3   Estimated Creatinine Clearance: 42 mL/min (by C-G formula based on Cr of 0.62).    Recent Labs  01/07/15 2107 01/07/15 2359 01/08/15 0636  GLUCAP 114* 110* 97   Insulin Requirements in the past 24 hours:  none  Nutritional Goals:  1200-1400 kCal, 65-75 grams of protein per day per RD recommendations on 9/8  Current Nutrition:  -Clinimix E 5/15 1300 ml over 18 hours + IVFE 20% 240 ml over 18 hours - provides 65g protein and 1403kcal which meets 100% of needs -Resource Breeze 1 container TID between meals -Regular diet- ate ~50% of meals yesterday  Assessment: Cynthia Bowen admitted 9/7 with bilateral groin pain- she had stents placed bilaterally in May and was having drainage from the right groin site after a fluid collection popped 2 days PTA.    Surgeries/Procedures: excision of infected fem-fem BPG, L ax-PFA bypass on 9/9. Multiple abdominal surgeries noted in 2014 as per PMH- ex lap repair of gastric perforation 07/02/2012, omental patch of perforation and gastric leak repair 07/07/2012, repeat ex laps for leak repair in March 2014, small bowel resection, placement of jejunostomy and lysis of adhesions 11/11/2012. During this admission, she had excision of fem-fem graft done 12/31/2014  GI:  Noted RD assessment from 9/8 states patient is malnourished. Has been receiving Megace without an increase in diet, though she has remained on a regular diet with decent intake. Per nutritional assessment, she eats what she can at home. Prealbumin of 14.5 indicates poor nutritional status. She is currently on Bentyl and Librax along with PO PPI. Last BM charted 9/14. Per Dr. Lianne Moris note would like to continue TPN for 2-4 weeks to help with incision healing. She may need a follow through study to determine why she has malabsorption issues.  Endo: no history of DM. CBGs/24h 97-110 - no issues with glycemic control with change to cyclic TPN  Lytes: K 4.4, Phos 4, Mag 1.9 (stable after  repletion), CorCa ~10.3. Phos x ca product <55. She has had no evidence of refeeding.   Renal: SCr 0.62, CrCl ~40-42mL/min- stable. MIVF at [place]: 100/RA  Cards: VSS, though HR can be brady. No meds.  Hepatobil: LFTs wnl except low ALT at 13. Albumin low at 2.7, baseline trigs 59.  Neuro: no acute issues  ID:   Vancomycin and ceftazidime for infected left groin with abscess. Vac in place. WBC 9.9, afebrile. Plans to continue antibiotics x1 month per ID recommendations.   Vanc 9/8>> Ceftaz 9/8>>  9/13 VT = 6 on 1g q24h>changed to 750mg  q12h 9/16 VT 15 on 750 q12h  9/7 groin wound: multiple organisms, none predominant 9/8 L&R groin wound (on zinacef): neg 9/8 blood x2: neg 9/9 urine: neg 9/9 L groin abscess (on cefazolin): neg 9/9 L groin wound: neg  AC/Heme: stared on full dose Lovenox + warfarin for tenuous perfusion of left leg via bypass- using as a patency adjunct. INR 1.Cynthia, Lovenox 40mg  q12h  Best Practices: Lovenox + warfarin  TPN Access: IR placed PICC 9/12 TPN start date: 9/12>>   Plan:  Change to 12 hour cyclic TPN in anticipation of long-term TPN provisions.   Clinimix E 5/15 1300 ml over 12 hours + 20% IVFE 240 ml over 12 hours.  This provides 1403 kcal and 65 g protein to meet 100% of her estimated needs. Continue full multivitamin and trace elements in TPN Continue CBG and SSI with cyclic TPN tonight to ensure she tolerates from glycemic control perspective.  CBG and SSI times are intentionally scheduled for cyclic TPN monitoring.  If she continues to tolerate will dc CBG checks and SSI. Continue MIVF at [place], mag and phos in the morning TPN labs on Monday  Legrand Como, [place].D., BCPS, AAHIVP Clinical Pharmacist Phone: [phone] or [phone] 01/08/2015, 7:40 AM

## 2015-01-08 NOTE — Progress Notes (Signed)
ANTICOAGULATION and ANTIBIOTIC CONSULT NOTE - Follow-up  Pharmacy Consult for Lovenox and Coumadin; Vancomycin Indication: tenuous blood flow to left leg via axilloprofunda bypass, anticoagulation as an adjunct; wound infection  Patient Measurements: Height: 5\' 5"  (165.1 cm) Weight: 85 lb 15.7 oz (39 kg) IBW/kg (Calculated) : 57  Labs:  Recent Labs  01/06/15 0536 01/07/15 0630 01/08/15 0410  HGB 8.3* 9.2* 8.3*  HCT 25.4* 28.4* 25.7*  PLT 235 320 298  LABPROT 14.6 14.8 15.5*  INR 1.12 1.14 1.21  CREATININE 0.59 0.65 0.62    Estimated Creatinine Clearance: 42 mL/min (by C-G formula based on Cr of 0.62).  Assessment:   Full-dose Lovenox and Coumadin begun 9/14 pm as adjunct tonight for tenuous blood flow to left leg via axilloprofunda bypass.  Weight 39 kg. Expected sensitivity to Coumadin doses due to low body weight, albumin 2.5, and variable PO intake. But INR is only 1.21 after Coumadin 4 mg daily x 2 doses, then none.  Coumadin 6 mg ordered for 9/16 but dose not charted. On TPN and Boost TID.    Day # 10 Vancomycin and Ceftazidime for infected graft, removed 12/31/14. Has wound VAC. Cultures negative. Planning about 1 month of antibiotics per ID 01/03/15, signed off.  Vancomycin regimen adjusted on 9/13 after low trough level of 6 mcg/ml.    Trough level 15 mcg/ml, at goal, on 9/16.  Goal of Therapy:  INR 2-3 Anti-Xa level 0.6-1 units/ml 4hrs after LMWH dose given Vancomycin trough levels 15-20 mcg/ml Monitor platelets by anticoagulation protocol: Yes   Plan:   Continue Lovenox to 40 mg sq q12hrs (~ 1 mg/kg/dose).  Coumadin 6 mg x 1 today.  Daily PT/INR.   Intermittent CBC and bmet.  Has TPN labs on Mondays and Thursdays.  Continue Vancomycin 750 mg IV q12hrs.  Check Vanc trough weekly, planning ~ 1 month of antibiotics.  Also on Ceftazidime 2 grams IV q12hrs.  Arty Baumgartner, [place] Pager: [phone] 01/08/2015,1:09 PM

## 2015-01-08 NOTE — Progress Notes (Addendum)
[facility]  Subjective  - Just not sure about everything and wants to go home as soon as possible.  Pain in the left thigh using PCA.   Objective 91/52 63 98.7 F (37.1 C) (Oral) 18 95%  Intake/Output Summary (Last 24 hours) at 01/08/15 0653 Last data filed at 01/08/15 0431  Gross per 24 hour  Intake    360 ml  Output   2200 ml  Net  -1840 ml    Left groin wound vac intact Feet bilaterally warm and well perfused Lungs non labored breathing Heart RRR  Assessment/Planning: POD # 8 s/p Exc infected fem-fem BPG, L ax-PFA bypass  Coumadin load in progress with Lovenox bridge.  INR 1.21  L groin with some granulation today: continue VAC sponge  Cont IV abx and TPN for 1 month total nutritional consult for sever malnutrition. Recheck nutrition labs in 1-2 weeks. Pending SNF early this next week   [facility] 01/08/2015 6:53 AM --  Laboratory Lab Results:  Recent Labs  01/07/15 0630 01/08/15 0410  WBC 10.1 9.9  HGB 9.2* 8.3*  HCT 28.4* 25.7*  PLT 320 298   BMET  Recent Labs  01/07/15 0630 01/08/15 0410  NA 137 139  K 4.6 4.4  CL 101 105  CO2 27 28  GLUCOSE 88 99  BUN 18 18  CREATININE 0.65 0.62  CALCIUM 9.1 9.3    COAG Lab Results  Component Value Date   INR 1.21 01/08/2015   INR 1.14 01/07/2015   INR 1.12 01/06/2015   No results found for: PTT    I agree with the above.  I have seen and examined the patient.  She has a palpable graft pulse.  Incision ok.  Vac in place. INR 1.2.  Annamarie Major

## 2015-01-09 ENCOUNTER — Inpatient Hospital Stay (HOSPITAL_COMMUNITY): Payer: Medicare Other

## 2015-01-09 DIAGNOSIS — I739 Peripheral vascular disease, unspecified: Secondary | ICD-10-CM

## 2015-01-09 LAB — CBC
HCT: 24.6 % — ABNORMAL LOW (ref 36.0–46.0)
Hemoglobin: 8.2 g/dL — ABNORMAL LOW (ref 12.0–15.0)
MCH: 28.2 pg (ref 26.0–34.0)
MCHC: 33.3 g/dL (ref 30.0–36.0)
MCV: 84.5 fL (ref 78.0–100.0)
PLATELETS: 289 10*3/uL (ref 150–400)
RBC: 2.91 MIL/uL — AB (ref 3.87–5.11)
RDW: 17.3 % — AB (ref 11.5–15.5)
WBC: 11.5 10*3/uL — ABNORMAL HIGH (ref 4.0–10.5)

## 2015-01-09 LAB — GLUCOSE, CAPILLARY
GLUCOSE-CAPILLARY: 100 mg/dL — AB (ref 65–99)
GLUCOSE-CAPILLARY: 120 mg/dL — AB (ref 65–99)
Glucose-Capillary: 89 mg/dL (ref 65–99)
Glucose-Capillary: 114 mg/dL — ABNORMAL HIGH (ref 65–99)
Glucose-Capillary: 89 mg/dL (ref 65–99)

## 2015-01-09 LAB — COMPREHENSIVE METABOLIC PANEL
ALK PHOS: 59 U/L (ref 38–126)
ALT: 13 U/L — AB (ref 14–54)
AST: 21 U/L (ref 15–41)
Albumin: 2.6 g/dL — ABNORMAL LOW (ref 3.5–5.0)
Anion gap: 7 (ref 5–15)
BILIRUBIN TOTAL: 0.5 mg/dL (ref 0.3–1.2)
BUN: 24 mg/dL — AB (ref 6–20)
CALCIUM: 9.3 mg/dL (ref 8.9–10.3)
CO2: 27 mmol/L (ref 22–32)
CREATININE: 0.59 mg/dL (ref 0.44–1.00)
Chloride: 104 mmol/L (ref 101–111)
Glucose, Bld: 90 mg/dL (ref 65–99)
Potassium: 4.7 mmol/L (ref 3.5–5.1)
Sodium: 138 mmol/L (ref 135–145)
Total Protein: 6 g/dL — ABNORMAL LOW (ref 6.5–8.1)

## 2015-01-09 LAB — PHOSPHORUS: Phosphorus: 4.7 mg/dL — ABNORMAL HIGH (ref 2.5–4.6)

## 2015-01-09 LAB — MAGNESIUM: Magnesium: 1.8 mg/dL (ref 1.7–2.4)

## 2015-01-09 LAB — PROTIME-INR
INR: 1.29 (ref 0.00–1.49)
PROTHROMBIN TIME: 16.2 s — AB (ref 11.6–15.2)

## 2015-01-09 MED ORDER — WARFARIN SODIUM 5 MG PO TABS
6.0000 mg | ORAL_TABLET | Freq: Once | ORAL | Status: AC
  Administered 2015-01-09: 6 mg via ORAL
  Filled 2015-01-09 (×2): qty 1

## 2015-01-09 MED ORDER — FAT EMULSION 20 % IV EMUL
240.0000 mL | INTRAVENOUS | Status: AC
  Administered 2015-01-09: 240 mL via INTRAVENOUS
  Filled 2015-01-09: qty 250

## 2015-01-09 MED ORDER — TRACE MINERALS CR-CU-MN-SE-ZN 10-1000-500-60 MCG/ML IV SOLN
INTRAVENOUS | Status: AC
  Administered 2015-01-09: 18:00:00 via INTRAVENOUS
  Filled 2015-01-09: qty 1300

## 2015-01-09 NOTE — Progress Notes (Signed)
Patient complaining of nausea and is vomiting clear liquid. Gave Zofran, repositioned patient call light within reach.

## 2015-01-09 NOTE — Progress Notes (Signed)
[facility] NOTE  Pharmacy Consult for TPN Indication: poor NPO intake and severe protein malnutrition s/p multiple GI surgeries in the past  Allergies  Allergen Reactions  . Avelox [Moxifloxacin Hcl In Nacl] Nausea And Vomiting  . Betadine [Povidone Iodine] Itching and Rash  . Alendronate Sodium Nausea And Vomiting and Other (See Comments)    dizziness  . Aspirin Nausea Only  . Codeine Nausea And Vomiting  . Doxycycline Nausea And Vomiting  . Fluconazole Nausea And Vomiting  . Hydrocodone Nausea And Vomiting    GI distress  . Hydrocodone-Acetaminophen Nausea And Vomiting  . Morphine And Related Nausea Only  . Neurontin [Gabapentin] Other (See Comments)    Mood changes   . Nsaids Other (See Comments)    Severe gastritis & perforation - avoid NSAIDs when possible  . Quinolones Hives and Itching  . Sertraline Hcl Nausea And Vomiting and Other (See Comments)    Hallucinations   . Sulfamethoxazole Hives and Itching  . Latex Rash  . Sulfa Antibiotics Rash    Patient Measurements: Height: 5\' 5"  (165.1 cm) Weight: 85 lb 15.7 oz (39 kg) IBW/kg (Calculated) : 57   Vital Signs: Temp: 98.4 F (36.9 C) (09/18 0450) Temp Source: Oral (09/18 0450) BP: 117/50 mmHg (09/18 0450) Pulse Rate: 100 (09/18 0450) Intake/Output from previous day: 09/17 0701 - 09/18 0700 In: 4299.9 [P.O.:480; IV Piggyback:1200; TPN:2619.9] Out: 2560 [Urine:2450; Drains:110] Intake/Output from this shift:    Labs:  Recent Labs  01/07/15 0630 01/08/15 0410 01/09/15 0608  WBC 10.1 9.9 11.5*  HGB 9.2* 8.3* 8.2*  HCT 28.4* 25.7* 24.6*  PLT 320 298 289  INR 1.14 1.21 1.29     Recent Labs  01/07/15 0630 01/08/15 0410 01/09/15 0608  NA 137 139 138  K 4.6 4.4 4.7  CL 101 105 104  CO2 27 28 27   GLUCOSE 88 99 90  BUN 18 18 24*  CREATININE 0.65 0.62 0.59  CALCIUM 9.1 9.3 9.3  MG 1.9 1.9 1.8  PHOS 3.9 4.0 4.7*  PROT  --  6.3* 6.0*  ALBUMIN  --  2.7* 2.6*  AST  --  19 21   ALT  --  13* 13*  ALKPHOS  --  55 59  BILITOT  --  0.3 0.5   Estimated Creatinine Clearance: 42 mL/min (by C-G formula based on Cr of 0.59).    Recent Labs  01/08/15 2057 01/09/15 0023 01/09/15 0626  GLUCAP 129* 120* 89   Insulin Requirements in the past 24 hours:  none  Nutritional Goals:  1200-1400 kCal, 65-75 grams of protein per day per RD recommendations on 9/8  Current Nutrition:  -Clinimix E 5/15 1300 ml over 12 hours + IVFE 20% 240 ml over 12 hours - provides 65g protein and 1403kcal which meets 100% of needs -Resource Breeze 1 container TID between meals -Regular diet- ate ~50-100% of meals yesterday  Assessment: 89 YOF admitted 9/7 with bilateral groin pain- she had stents placed bilaterally in May and was having drainage from the right groin site after a fluid collection popped 2 days PTA.    Surgeries/Procedures: excision of infected fem-fem BPG, L ax-PFA bypass on 9/9. Multiple abdominal surgeries noted in 2014 as per PMH- ex lap repair of gastric perforation 07/02/2012, omental patch of perforation and gastric leak repair 07/07/2012, repeat ex laps for leak repair in March 2014, small bowel resection, placement of jejunostomy and lysis of adhesions 11/11/2012. During this admission, she had excision of fem-fem graft done  12/31/2014  GI:  Noted RD assessment from 9/8 states patient is malnourished. Has been receiving Megace without an increase in diet, though she has remained on a regular diet with decent intake. Per nutritional assessment, she eats what she can at home. Prealbumin of 14.5 indicates poor nutritional status. She is currently on Bentyl and Librax along with PO PPI. Last BM charted 9/14. Per Dr. Lianne Moris note would like to continue TPN for 2-4 weeks to help with incision healing. She may need a follow through study to determine why she has malabsorption issues.  Endo: no history of DM. CBGs/24h 89-129 - no issues with glycemic control with change to cyclic TPN.   Note there are 2 CBGs listed in her chart that may be inaccurate - 532 and 304 obtained around 11am yesterday.  Lytes: K 4.7, Phos 4.7 - up a little today, Mag 1.8 (stable after repletion), CorCa ~10.4. Ca x Phos product <55. She has had no evidence of refeeding.   Renal: SCr 0.59, CrCl ~40-56mL/min- stable. MIVF at [place]: 100/RA  Cards: VSS, though HR can be brady. No meds.  Hepatobil: LFTs wnl except low ALT at 13. Albumin low at 2.6, baseline trigs 59.  Neuro: no acute issues  ID:   Vancomycin and ceftazidime for infected left groin with abscess. Vac in place. WBC 11.5, afebrile. Plans to continue antibiotics x1 month per ID recommendations.   Vanc 9/8>> Ceftaz 9/8>>  9/13 VT = 6 on 1g q24h>changed to 750mg  q12h 9/16 VT 15 on 750 q12h  9/7 groin wound: multiple organisms, none predominant 9/8 L&R groin wound (on zinacef): neg 9/8 blood x2: neg 9/9 urine: neg 9/9 L groin abscess (on cefazolin): neg 9/9 L groin wound: neg  AC/Heme: stared on full dose Lovenox + warfarin for tenuous perfusion of left leg via bypass- using as a patency adjunct. INR 1.29, Lovenox 40mg  q12h  Best Practices: Lovenox + warfarin  TPN Access: IR placed PICC 9/12 TPN start date: 9/12>>   Plan:  Continue 12 hour cyclic TPN in anticipation of long-term TPN provisions.   Clinimix E 5/15 1300 ml over 12 hours + 20% IVFE 240 ml over 12 hours.  This provides 1403 kcal and 65 g protein to meet 100% of her estimated needs. Continue full multivitamin and trace elements in TPN Continue CBG and SSI with cyclic TPN tonight to ensure she tolerates from glycemic control perspective. CBG and SSI times are intentionally scheduled for cyclic TPN monitoring. If she continues to tolerate will d/c CBG checks and SSI. Continue MIVF at [place] labs on Monday  Legrand Como, [place].D., BCPS, AAHIVP Clinical Pharmacist Phone: [phone] or [phone] 01/09/2015, 7:42 AM

## 2015-01-09 NOTE — Progress Notes (Signed)
VASCULAR LAB PRELIMINARY  PRELIMINARY  PRELIMINARY  PRELIMINARY  Left upper extremity ultrasound to evaluate for pseudoaneurysm completed.    Preliminary report:  There is no obvious evidence of pseudoaneurysm noted in the left clavicular area.    KANADY, CANDACE, RVT 01/09/2015, 6:43 PM

## 2015-01-09 NOTE — Progress Notes (Signed)
ANTICOAGULATION and ANTIBIOTIC CONSULT NOTE - Follow-up  Pharmacy Consult for Lovenox and Coumadin; Vancomycin Indication: tenuous blood flow to left leg via axilloprofunda bypass, anticoagulation as an adjunct; wound infection  Patient Measurements: Height: 5\' 5"  (165.1 cm) Weight: 85 lb 15.7 oz (39 kg) IBW/kg (Calculated) : 57  Labs:  Recent Labs  01/07/15 0630 01/08/15 0410 01/09/15 0608  HGB 9.2* 8.3* 8.2*  HCT 28.4* 25.7* 24.6*  PLT 320 298 289  LABPROT 14.8 15.5* 16.2*  INR 1.14 1.21 1.29  CREATININE 0.65 0.62 0.59    Estimated Creatinine Clearance: 42 mL/min (by C-G formula based on Cr of 0.59).  Assessment:   Full-dose Lovenox and Coumadin begun 9/14 pm as adjunct tonight for tenuous blood flow to left leg via axilloprofunda bypass.  Weight 39 kg. Expected sensitivity to Coumadin doses due to low body weight, albumin 2.5, and variable PO intake. But INR is only 1.29 after Coumadin 4 mg daily x 2 doses, then none (missed dose 9/16), then 6 mg on 01/08/15.  On cyclicTPN and Boost TID.    Day # 11 Vancomycin and Ceftazidime for infected graft, removed 12/31/14. Has wound VAC. Cultures negative. Planning about 1 month of antibiotics per ID 01/03/15, signed off.  Vancomycin regimen adjusted on 9/13 after low trough level of 6 mcg/ml.    Trough level 15 mcg/ml, at goal, on 9/16.  Goal of Therapy:  INR 2-3 Anti-Xa level 0.6-1 units/ml 4hrs after LMWH dose given Vancomycin trough levels 15-20 mcg/ml Monitor platelets by anticoagulation protocol: Yes   Plan:   Continue Lovenox to 40 mg sq q12hrs (~ 1 mg/kg/dose).  Coumadin 6 mg x 1 again today. Hesitant to increase today, but will consider increase on 9/19 if INR not rising after 6 mg daily x 2 doses.  Daily PT/INR.   Intermittent CBC and bmet.  Has TPN labs on Mondays and Thursdays.  Continue Vancomycin 750 mg IV q12hrs.  Check Vanc trough weekly, planning ~ 1 month of antibiotics.  Next vanc trough by 01/14/15.  Also on  Ceftazidime 2 grams IV q12hrs.  Arty Baumgartner, [place] Pager: [phone] 01/09/2015,11:57 AM

## 2015-01-09 NOTE — Progress Notes (Signed)
Patient states she feels much better and was able to take her medication. No distress or pain at this time.  Call light within reach.

## 2015-01-09 NOTE — Progress Notes (Signed)
Subjective  - POD #9  C/o pain in let groin and new swelling in left shoulder   Physical Exam:  Palpable pulse in graft Wound vacs in place  New buldge in left infraclavicular area which is soft and not pulsatile and without erythema       Assessment/Plan:  POD #9  Will check u/s to r/o pseudoaneurysm in left infraclavicular incision Continue nutritional support Continue anticoagulation, INR 1.29  Brabham, [place] 01/09/2015 4:31 PM --  Filed Vitals:   01/09/15 1516  BP: 98/76  Pulse: 95  Temp: 99 F (37.2 C)  Resp: 18    Intake/Output Summary (Last 24 hours) at 01/09/15 1631 Last data filed at 01/09/15 1200  Gross per 24 hour  Intake 2859.92 ml  Output   3510 ml  Net -650.08 ml     Laboratory CBC    Component Value Date/Time   WBC 11.5* 01/09/2015 0608   WBC 6.3 10/01/2014 1434   WBC 8.2 08/26/2012 1300   HGB 8.2* 01/09/2015 0608   HGB 10.6* 10/01/2014 1434   HGB 10.6* 08/26/2012 1300   HCT 24.6* 01/09/2015 0608   HCT 32.8* 10/01/2014 1434   HCT 31.2* 08/26/2012 1300   PLT 289 01/09/2015 0608   PLT 516* 08/26/2012 1300    BMET    Component Value Date/Time   NA 138 01/09/2015 0608   NA 138 08/26/2012 1300   K 4.7 01/09/2015 0608   K 4.0 08/26/2012 1300   CL 104 01/09/2015 0608   CL 108* 08/26/2012 1300   CO2 27 01/09/2015 0608   CO2 24 08/26/2012 1300   GLUCOSE 90 01/09/2015 0608   GLUCOSE 83 08/26/2012 1300   BUN 24* 01/09/2015 0608   BUN 29* 08/26/2012 1300   BUN 4 08/19/2009   CREATININE 0.59 01/09/2015 0608   CREATININE 0.65 10/01/2014 1420   CREATININE 0.79 08/26/2012 1300   CALCIUM 9.3 01/09/2015 0608   CALCIUM 9.9 08/26/2012 1300   GFRNONAA >60 01/09/2015 0608   GFRNONAA >89 07/16/2014 0948   GFRNONAA >60 08/26/2012 1300   GFRAA >60 01/09/2015 0608   GFRAA >89 07/16/2014 0948   GFRAA >60 08/26/2012 1300    COAG Lab Results  Component Value Date   INR 1.29 01/09/2015   INR 1.21 01/08/2015   INR 1.14 01/07/2015    No results found for: PTT  Antibiotics Anti-infectives    Start     Dose/Rate Route Frequency Ordered Stop   01/05/15 0000  vancomycin (VANCOCIN) IVPB 750 mg/150 ml premix     750 mg 150 mL/hr over 60 Minutes Intravenous Every 12 hours 01/04/15 2339     12/31/14 2300  cefUROXime (ZINACEF) 1.5 g in dextrose 5 % 50 mL IVPB     1.5 g 100 mL/hr over 30 Minutes Intravenous  Once 12/31/14 2251 12/31/14 2337   12/31/14 2215  cefUROXime (ZINACEF) 1.5 g in dextrose 5 % 50 mL IVPB  Status:  Discontinued     1.5 g 100 mL/hr over 30 Minutes Intravenous Every 12 hours 12/31/14 2214 12/31/14 2246   12/31/14 1409  dextrose 5 % with cefUROXime (ZINACEF) ADS Med    Comments:  Forte, Lindsi   : cabinet override      12/31/14 1409 01/01/15 0214   12/30/14 2200  cefTAZidime (FORTAZ) 2 g in dextrose 5 % 50 mL IVPB     2 g 100 mL/hr over 30 Minutes Intravenous Every 12 hours 12/30/14 1619     12/30/14 2100  ceFAZolin (  ANCEF) IVPB 1 g/50 mL premix  Status:  Discontinued     1 g 100 mL/hr over 30 Minutes Intravenous 3 times per day 12/30/14 1525 12/30/14 1619   12/30/14 1645  vancomycin (VANCOCIN) IVPB 1000 mg/200 mL premix  Status:  Discontinued     1,000 mg 200 mL/hr over 60 Minutes Intravenous Every 24 hours 12/30/14 1635 01/04/15 2338   12/30/14 1100  cefUROXime (ZINACEF) 1.5 g in dextrose 5 % 50 mL IVPB     1.5 g 100 mL/hr over 30 Minutes Intravenous To Short Stay 12/30/14 0038 12/30/14 1258   12/29/14 2315  ceFAZolin (ANCEF) IVPB 1 g/50 mL premix     1 g 100 mL/hr over 30 Minutes Intravenous  Once 12/29/14 2307 12/30/14 0030       V. Leia Alf, M.D. [facility] [place] Office: [phone] Pager:  [phone]

## 2015-01-10 LAB — COMPREHENSIVE METABOLIC PANEL
ALBUMIN: 2.9 g/dL — AB (ref 3.5–5.0)
ALT: 17 U/L (ref 14–54)
AST: 21 U/L (ref 15–41)
Alkaline Phosphatase: 58 U/L (ref 38–126)
Anion gap: 8 (ref 5–15)
BILIRUBIN TOTAL: 0.3 mg/dL (ref 0.3–1.2)
BUN: 30 mg/dL — AB (ref 6–20)
CHLORIDE: 104 mmol/L (ref 101–111)
CO2: 25 mmol/L (ref 22–32)
Calcium: 9.4 mg/dL (ref 8.9–10.3)
Creatinine, Ser: 0.75 mg/dL (ref 0.44–1.00)
GFR calc Af Amer: >60 mL/min (ref >60)
GFR calc non Af Amer: >60 mL/min (ref >60)
GLUCOSE: 86 mg/dL (ref 65–99)
POTASSIUM: 5 mmol/L (ref 3.5–5.1)
SODIUM: 137 mmol/L (ref 135–145)
Total Protein: 6.6 g/dL (ref 6.5–8.1)

## 2015-01-10 LAB — GLUCOSE, CAPILLARY
GLUCOSE-CAPILLARY: 132 mg/dL — AB (ref 65–99)
Glucose-Capillary: 131 mg/dL — ABNORMAL HIGH (ref 65–99)
Glucose-Capillary: 137 mg/dL — ABNORMAL HIGH (ref 65–99)

## 2015-01-10 LAB — DIFFERENTIAL
BASOS ABS: 0 10*3/uL (ref 0.0–0.1)
Basophils Relative: 0 %
Eosinophils Absolute: 0.2 10*3/uL (ref 0.0–0.7)
Eosinophils Relative: 2 %
LYMPHS ABS: 2.3 10*3/uL (ref 0.7–4.0)
LYMPHS PCT: 19 %
MONOS PCT: 9 %
Monocytes Absolute: 1.1 10*3/uL — ABNORMAL HIGH (ref 0.1–1.0)
NEUTROS ABS: 8.4 10*3/uL — AB (ref 1.7–7.7)
Neutrophils Relative %: 70 %

## 2015-01-10 LAB — CBC
HEMATOCRIT: 25.8 % — AB (ref 36.0–46.0)
Hemoglobin: 8.7 g/dL — ABNORMAL LOW (ref 12.0–15.0)
MCH: 28.5 pg (ref 26.0–34.0)
MCHC: 33.7 g/dL (ref 30.0–36.0)
MCV: 84.6 fL (ref 78.0–100.0)
PLATELETS: 328 10*3/uL (ref 150–400)
RBC: 3.05 MIL/uL — ABNORMAL LOW (ref 3.87–5.11)
RDW: 17.3 % — AB (ref 11.5–15.5)
WBC: 12.1 10*3/uL — AB (ref 4.0–10.5)

## 2015-01-10 LAB — PROTIME-INR
INR: 1.36 (ref 0.00–1.49)
PROTHROMBIN TIME: 16.8 s — AB (ref 11.6–15.2)

## 2015-01-10 LAB — MAGNESIUM: Magnesium: 1.9 mg/dL (ref 1.7–2.4)

## 2015-01-10 LAB — PREALBUMIN: Prealbumin: 31.8 mg/dL (ref 18–38)

## 2015-01-10 LAB — PHOSPHORUS: Phosphorus: 4.7 mg/dL — ABNORMAL HIGH (ref 2.5–4.6)

## 2015-01-10 LAB — TRIGLYCERIDES: TRIGLYCERIDES: 66 mg/dL (ref <150)

## 2015-01-10 MED ORDER — FAT EMULSION 20 % IV EMUL: 240.0000 mL | INTRAVENOUS | Status: DC

## 2015-01-10 MED ORDER — WARFARIN SODIUM 5 MG PO TABS
6.0000 mg | ORAL_TABLET | Freq: Once | ORAL | Status: AC
  Administered 2015-01-10: 6 mg via ORAL
  Filled 2015-01-10 (×2): qty 1

## 2015-01-10 MED ORDER — FENTANYL 12 MCG/HR TD PT72
12.5000 ug | MEDICATED_PATCH | TRANSDERMAL | Status: DC
  Administered 2015-01-10: 12.5 ug via TRANSDERMAL
  Filled 2015-01-10: qty 1

## 2015-01-10 MED ORDER — FAT EMULSION 20 % IV EMUL
240.0000 mL | INTRAVENOUS | Status: DC
  Filled 2015-01-10: qty 250

## 2015-01-10 MED ORDER — CLINIMIX E/DEXTROSE (5/15) 5 % IV SOLN
INTRAVENOUS | Status: DC
  Filled 2015-01-10 (×2): qty 1000

## 2015-01-10 MED ORDER — ADULT MULTIVITAMIN W/MINERALS CH
1.0000 | ORAL_TABLET | Freq: Every day | ORAL | Status: DC
  Administered 2015-01-10 – 2015-01-12 (×3): 1 via ORAL
  Filled 2015-01-10 (×3): qty 1

## 2015-01-10 MED ORDER — FAT EMULSION 20 % IV EMUL
240.0000 mL | INTRAVENOUS | Status: AC
  Administered 2015-01-10: 240 mL via INTRAVENOUS
  Filled 2015-01-10: qty 250

## 2015-01-10 MED ORDER — TRACE MINERALS CR-CU-MN-SE-ZN 10-1000-500-60 MCG/ML IV SOLN: INTRAVENOUS | Status: DC

## 2015-01-10 MED ORDER — OXYCODONE-ACETAMINOPHEN 5-325 MG PO TABS
1.0000 | ORAL_TABLET | Freq: Four times a day (QID) | ORAL | Status: DC | PRN
Start: 2015-01-10 — End: 2015-01-12

## 2015-01-10 MED ORDER — CLINIMIX E/DEXTROSE (5/15) 5 % IV SOLN
INTRAVENOUS | Status: AC
  Administered 2015-01-10: 18:00:00 via INTRAVENOUS
  Filled 2015-01-10 (×2): qty 1000

## 2015-01-10 NOTE — Progress Notes (Signed)
OT Cancellation Note  Patient Details Name: ADDALEE KAVANAGH MRN: [id number] DOB: [birth year]   Cancelled Treatment:    Reason Eval/Treat Not Completed: Other (comment) (Pt wanting to eat at this time.)  Benito Mccreedy OTR/L [phone] 01/10/2015, 2:31 PM

## 2015-01-10 NOTE — Progress Notes (Signed)
Pt still on TPN, wound vac, IV antibiotics- per [facility] pt is still appropriate for LTACH- due to high level of needs would not be candidate for SNF at this time.  CSW signing off at this time- Please reconsult if pt is unable to go to [facility] at time of DC.  Domenica Reamer, [place] Social Worker [phone]

## 2015-01-10 NOTE — Progress Notes (Signed)
Pt heart rate elevated and anxious due to inability to find cell phone. RN and NT searched entire room. I called patient's sister to let her know and she said cell phone was on end of bed before they left today.  Patient was assisted to chair after family left and student nurse changed linen. NT Elmyra Ricks spoke with patient and family and called laundry to look for phone. Patient aware that office of patient satisfaction would be notified. Will follow up tomorrow.  Unable to get patient to relax and take IV metoprolol for elevated heart rate.  Family left and patient left in room alone to settle down.  Heart rate returned to 100's. Pt resting with call bell within reach.  Will continue to monitor. Payton Emerald, RN

## 2015-01-10 NOTE — Progress Notes (Signed)
[place] NOTE  Pharmacy Consult for TPN Indication: poor NPO intake and severe protein malnutrition s/p multiple GI surgeries in the past  Allergies  Allergen Reactions  . Avelox [Moxifloxacin Hcl In Nacl] Nausea And Vomiting  . Betadine [Povidone Iodine] Itching and Rash  . Alendronate Sodium Nausea And Vomiting and Other (See Comments)    dizziness  . Aspirin Nausea Only  . Codeine Nausea And Vomiting  . Doxycycline Nausea And Vomiting  . Fluconazole Nausea And Vomiting  . Hydrocodone Nausea And Vomiting    GI distress  . Hydrocodone-Acetaminophen Nausea And Vomiting  . Morphine And Related Nausea Only  . Neurontin [Gabapentin] Other (See Comments)    Mood changes   . Nsaids Other (See Comments)    Severe gastritis & perforation - avoid NSAIDs when possible  . Quinolones Hives and Itching  . Sertraline Hcl Nausea And Vomiting and Other (See Comments)    Hallucinations   . Sulfamethoxazole Hives and Itching  . Latex Rash  . Sulfa Antibiotics Rash    Patient Measurements: Height: 5\' 5"  (165.1 cm) Weight: 85 lb 15.7 oz (39 kg) IBW/kg (Calculated) : 57   Vital Signs: Temp: 98.4 F (36.9 C) (09/19 0839) Temp Source: Oral (09/19 0839) BP: 112/57 mmHg (09/19 0839) Pulse Rate: 83 (09/19 0839) Intake/Output from previous day: 09/18 0701 - 09/19 0700 In: 240 [P.O.:240] Out: 2950 [Urine:2900; Drains:50] Intake/Output from this shift: Total I/O In: 120 [P.O.:120] Out: -   Labs:  Recent Labs  01/08/15 0410 01/09/15 0608 01/10/15 0516  WBC 9.9 11.5* 12.1*  HGB 8.3* 8.2* 8.7*  HCT 25.7* 24.6* 25.8*  PLT 298 289 328  INR 1.21 1.29 1.36     Recent Labs  01/08/15 0410 01/09/15 0608 01/10/15 0516  NA 139 138 137  K 4.4 4.7 5.0  CL 105 104 104  CO2 28 27 25   GLUCOSE 99 90 86  BUN 18 24* 30*  CREATININE 0.62 0.59 0.75  CALCIUM 9.3 9.3 9.4  MG 1.9 1.8 1.9  PHOS 4.0 4.7* 4.7*  PROT 6.3* 6.0* 6.6  ALBUMIN 2.7* 2.6* 2.9*  AST 19 21 21    ALT 13* 13* 17  ALKPHOS 55 59 58  BILITOT 0.3 0.5 0.3  PREALBUMIN  --   --  31.8  TRIG  --   --  66   Estimated Creatinine Clearance: 42 mL/min (by C-G formula based on Cr of 0.75).    Recent Labs  01/09/15 2028 01/10/15 0020 01/10/15 0432  GLUCAP 100* 137* 131*   Insulin Requirements in the past 24 hours:  none  Nutritional Goals:  1200-1400 kCal, 65-75 grams of protein per day per RD recommendations on 9/8  Current Nutrition:  -Clinimix E 5/15 1300 ml over 12 hours + IVFE 20% 240 ml over 12 hours - provides 65g protein and 1403kcal which meets 100% of needs -Resource Breeze 1 container TID between meals -Regular diet- ate ~50-100% of meals yesterday, took 2 of 3 breeze supplements yesterday  Assessment: 31 YOF admitted 9/7 with bilateral groin pain- she had stents placed bilaterally in May and was having drainage from the right groin site after a fluid collection popped 2 days PTA.    Surgeries/Procedures: excision of infected fem-fem BPG, L ax-PFA bypass on 9/9. Multiple abdominal surgeries noted in 2014 as per PMH- ex lap repair of gastric perforation 07/02/2012, omental patch of perforation and gastric leak repair 07/07/2012, repeat ex laps for leak repair in March 2014, small bowel resection, placement of  jejunostomy and lysis of adhesions 11/11/2012. During this admission, she had excision of fem-fem graft done 12/31/2014  GI:  Noted RD assessment from 9/8 states patient is malnourished. Has been receiving Megace without an increase in diet, though she has remained on a regular diet with decent intake. Per nutritional assessment, she eats what she can at home. Prealbumin on 9/13 of 14.5 indicates poor nutritional status. Today prealbumin is up to 31.8. Albumin up to 2.9 She is currently on Bentyl and Librax along with PO PPI. Last BM charted 9/18. Per Dr. Lianne Moris note would like to continue TPN for 1 month to help with incision healing. She may need a follow through study to  determine why she has malabsorption issues.  Endo: no history of DM. CBGs/24h 100-137 - no issues with glycemic control with change to cyclic TPN.  Will stop SSI/CBGs  Lytes: K 5, Phos 4.7,  Mag 1.9 CoCa ~10.28. Ca x Phos product <55. She has had no evidence of refeeding.   Renal: SCr stable  MIVF at [place]: 100/RA  Cards:  No meds.  Hepatobil: LFTs wnl . Albumin 2.9, baseline trigs 59, 66 on 9/19.  Neuro: no acute issues  ID:   Vancomycin and ceftazidime for infected left groin with abscess. Vac in place. WBC 12.1, afebrile. Plans to continue antibiotics x1 month per ID recommendations.   Vanc 9/8>> Ceftaz 9/8>>  9/13 VT = 6 on 1g q24h>changed to 750mg  q12h 9/16 VT 15 on 750 q12h  9/7 groin wound: multiple organisms, none predominant 9/8 L&R groin wound (on zinacef): neg 9/8 blood x2: neg 9/9 urine: neg 9/9 L groin abscess (on cefazolin): neg 9/9 L groin wound: neg  AC/Heme: stared on full dose Lovenox + warfarin for tenuous perfusion of left leg via bypass- using as a patency adjunct. INR 1.36, Lovenox 40mg  q12h  Best Practices: Lovenox + warfarin  TPN Access: IR placed PICC 9/12 TPN start date: 9/12>>   Plan:  Continue 12 hour cyclic TPN in anticipation of long-term TPN provisions.   Decrease Clinimix E 5/15 to 1000 ml over 12 hours + 20% IVFE 240 ml over 12 hours.  This provides 1190 kcal and 50 g protein to meet ~ 77 % of protein and ~ 99 % kcal needs -DC MVI and trace elements in TPN - give PO - d/c CBG checks and SSI. Continue MIVF at [place] labs on Thursday Eudelia Bunch, [place].D. [phone] 01/10/2015 10:09 AM

## 2015-01-10 NOTE — Progress Notes (Addendum)
[facility] [place]  Subjective  - Doing ok over all states the right leg burns.   Objective 115/42 73 98.2 F (36.8 C) (Oral) 17 100%  Intake/Output Summary (Last 24 hours) at 01/10/15 0730 Last data filed at 01/10/15 4627  Gross per 24 hour  Intake    240 ml  Output   2950 ml  Net  -2710 ml   Left groin wound vac intact Feet bilaterally warm and well perfused, AROM intact Lungs non labored breathing Heart RRR   Assessment/Planning: POD # 10 s/p Exc infected fem-fem BPG, L ax-PFA bypass; chronic severe protein malnutrition due to chronic medical illness and poor PO intake with poor PO absorption  Coumadin load in progress with Lovenox bridge. INR 1.36  L groin with some granulation today: continue VAC sponge  Cont IV abx and TPN for 1 month total nutritional consult for sever malnutrition. Recheck nutrition labs in 1-2 weeks. Pending SNF early this next week    Laurence Slate [facility] 01/10/2015 7:30 AM --  Laboratory Lab Results:  Recent Labs  01/09/15 0608 01/10/15 0516  WBC 11.5* 12.1*  HGB 8.2* 8.7*  HCT 24.6* 25.8*  PLT 289 328   BMET  Recent Labs  01/09/15 0608 01/10/15 0516  NA 138 137  K 4.7 5.0  CL 104 104  CO2 27 25  GLUCOSE 90 86  BUN 24* 30*  CREATININE 0.59 0.75  CALCIUM 9.3 9.4    COAG Lab Results  Component Value Date   INR 1.36 01/10/2015   INR 1.29 01/09/2015   INR 1.21 01/08/2015   No results found for: PTT    Addendum  I have independently interviewed and examined the patient, and I agree with the physician assistant's findings. R groin had healed. Over weekend, pt developed some swelling at axillary exposure.  Non-invasive study suggests no pseudoaneurysm so likely seroma.  Given size and patient's history of repeated infection of fluid collections, favor drainage in operating room.  Will try to schedule for tomorrow.  Chest incision healed.  Left PFA exposure incisions healed.  Both feet  viable.  Hepatic Function Latest Ref Rng 01/10/2015 01/09/2015 01/08/2015  Total Protein 6.5 - 8.1 g/dL 6.6 6.0(L) 6.3(L)  Albumin 3.5 - 5.0 g/dL 2.9(L) 2.6(L) 2.7(L)  AST 15 - 41 U/L '21 21 19  ' ALT 14 - 54 U/L 17 13(L) 13(L)  Alk Phosphatase 38 - 126 U/L 58 59 55  Total Bilirubin 0.3 - 1.2 mg/dL 0.3 0.5 0.3  Bilirubin, Direct 0.0 - 0.3 mg/dL - - -     - continue IV and abx and TPN - marked - continue VAC dressings: M-W-F.  Will change today to check wound. - NPO.  Axillary exploration and drainage tomorrow. - Suspect will be ready for transfer to long-term care facility sometime this week for full month of Abx and TPN - Fentanyl patch.  D/C PCA  Adele Barthel, MD [facility] [place] Office: [phone] Pager: [phone]  01/10/2015, 9:11 AM

## 2015-01-10 NOTE — Addendum Note (Signed)
Encounter addended by: [name] Koyanagi, MD on: 01/10/2015  7:45 PM<BR>     Documentation filed: BPA Follow-up Actions

## 2015-01-10 NOTE — Progress Notes (Signed)
Patient resting at this time, no needs expressed, call light within reach

## 2015-01-10 NOTE — Progress Notes (Addendum)
Nutrition Follow-up  DOCUMENTATION CODES:   Underweight, Severe malnutrition in context of chronic illness  INTERVENTION:    TPN per Pharmacy  NUTRITION DIAGNOSIS:   Increased nutrient needs related to chronic illness as evidenced by estimated needs, ongoing  GOAL:   Patient will meet greater than or equal to 90% of their needs, met  MONITOR:   PO intake, Supplement acceptance, Labs, Weight trends, Skin, I & O's  ASSESSMENT:   69 y.o. Female with PMH of COPD, PNA, PVD, fibromyalgia; presented for evaluation of groin pain.   Pt remains on a Regular diet.  PO intake 50% per flowsheet records.  Drinking her Boost Breeze supplements.  On Megace and MVI daily.  CWOCN notes reviewed.  Patient is currently receiving cyclic TPN with Clinimix E 5/15 + 20% IVFE 240 ml x 12 hours.  Provides 1190 kcal and 50 grams protein per day. Meets 99% minimum estimated energy needs and 77% minimum estimated protein needs.    Disposition: LTACH for ABX and TPN provisions.  Diet Order:  Diet regular Room service appropriate?: Yes; Fluid consistency:: Thin Diet NPO time specified Except for: Sips with Meds .TPN (CLINIMIX-E) Adult  Skin:  L groin wound VAC  Last BM:  9/18  Height:   Ht Readings from Last 1 Encounters:  12/31/14 '5\' 5"'  (1.651 m)    Weight:   Wt Readings from Last 1 Encounters:  12/31/14 85 lb 15.7 oz (39 kg)    Ideal Body Weight:  56.8 kg  BMI:  Body mass index is 14.31 kg/(m^2).  Estimated Nutritional Needs:   Kcal:  1200-1400  Protein:  65-75 gm  Fluid:  >/= 1.5 L  EDUCATION NEEDS:   No education needs identified at this time  Arthur Holms, RD, LDN Pager #: [phone] After-Hours Pager #: [phone]

## 2015-01-10 NOTE — Progress Notes (Signed)
Physical Therapy Treatment Patient Details Name: Cynthia Bowen MRN: [id number] DOB: [birth year] Today's Date: 01/10/2015    History of Present Illness Adm 9/7 with infected fem-fem BPG; 9/8 I& D bil groin with removal of graft; 9/9 underwent Lt axillary-femoral BPG    PT Comments    Pt progressing towards physical therapy goals. Focus of session was to get pt sitting up for lunch. Gowns and linen required changing due to profuse sweating. Student nurse present for assist with managing lines, including wound vac, and pt was able to transition to the chair fairly well with no physical assistance. Continues to appear unsteady without UE support. Will continue to follow.   Follow Up Recommendations  Other (comment);Home health PT;Supervision for mobility/OOB (talk of needing LTAC for Abx and nutrition.)     Equipment Recommendations  Rolling walker with 5" wheels    Recommendations for Other Services       Precautions / Restrictions Precautions Precautions: Fall Precaution Comments: Wound vac in groin Restrictions Weight Bearing Restrictions: No    Mobility  Bed Mobility Overal bed mobility: Modified Independent Bed Mobility: Supine to Sit           General bed mobility comments: Pt in long sitting in bed when PT arrived. Required no assistance to transition fully to EOB.   Transfers Overall transfer level: Needs assistance Equipment used: None Transfers: Sit to/from [facility] Sit to Stand: Supervision Stand pivot transfers: Supervision       General transfer comment: Bed rails used for support, and pt appears unsteady when taking steps bed to chair. Pt was reaching for arm rests of chair for support as she transitioned to recliner.   Ambulation/Gait             General Gait Details: Declined as she was wanting to eat her lunch.   Stairs            Wheelchair Mobility    Modified Rankin (Stroke Patients Only)       Balance  Overall balance assessment: Needs assistance Sitting-balance support: Feet supported;No upper extremity supported Sitting balance-Leahy Scale: Normal     Standing balance support: No upper extremity supported;During functional activity Standing balance-Leahy Scale: Good                      Cognition Arousal/Alertness: Awake/alert Behavior During Therapy: WFL for tasks assessed/performed Overall Cognitive Status: Within Functional Limits for tasks assessed                      Exercises      General Comments        Pertinent Vitals/Pain Pain Assessment: No/denies pain    Home Living                      Prior Function            PT Goals (current goals can now be found in the care plan section) Acute Rehab PT Goals Patient Stated Goal: not stated PT Goal Formulation: With patient Time For Goal Achievement: 01/15/15 Potential to Achieve Goals: Good Progress towards PT goals: Progressing toward goals    Frequency  Min 3X/week    PT Plan Current plan remains appropriate    Co-evaluation             End of Session   Activity Tolerance: Patient tolerated treatment well Patient left: in chair;with call bell/phone within reach;with nursing/sitter in room  Time: 0814-4818 PT Time Calculation (min) (ACUTE ONLY): 14 min  Charges:  $Therapeutic Activity: 8-22 mins                    G Codes:      Rolinda Roan 02/04/15, 3:05 PM   Rolinda Roan, PT, DPT Acute Rehabilitation Services Pager: [phone]

## 2015-01-10 NOTE — Progress Notes (Signed)
ANTICOAGULATION CONSULT NOTE - Follow Up Consult  Pharmacy Consult for Coumadin and Lovenox Indication: Tenuous circulation to LLE via bypass, anticoag as adjunct  Allergies  Allergen Reactions  . Avelox [Moxifloxacin Hcl In Nacl] Nausea And Vomiting  . Betadine [Povidone Iodine] Itching and Rash  . Alendronate Sodium Nausea And Vomiting and Other (See Comments)    dizziness  . Aspirin Nausea Only  . Codeine Nausea And Vomiting  . Doxycycline Nausea And Vomiting  . Fluconazole Nausea And Vomiting  . Hydrocodone Nausea And Vomiting    GI distress  . Hydrocodone-Acetaminophen Nausea And Vomiting  . Morphine And Related Nausea Only  . Neurontin [Gabapentin] Other (See Comments)    Mood changes   . Nsaids Other (See Comments)    Severe gastritis & perforation - avoid NSAIDs when possible  . Quinolones Hives and Itching  . Sertraline Hcl Nausea And Vomiting and Other (See Comments)    Hallucinations   . Sulfamethoxazole Hives and Itching  . Latex Rash  . Sulfa Antibiotics Rash    Patient Measurements: Height: 5\' 5"  (165.1 cm) Weight: 85 lb 15.7 oz (39 kg) IBW/kg (Calculated) : 57 Heparin Dosing Weight:   Vital Signs: Temp: 98.4 F (36.9 C) (09/19 0839) Temp Source: Oral (09/19 0839) BP: 112/57 mmHg (09/19 0839) Pulse Rate: 83 (09/19 0839)  Labs:  Recent Labs  01/08/15 0410 01/09/15 0608 01/10/15 0516  HGB 8.3* 8.2* 8.7*  HCT 25.7* 24.6* 25.8*  PLT 298 289 328  LABPROT 15.5* 16.2* 16.8*  INR 1.21 1.29 1.36  CREATININE 0.62 0.59 0.75    Estimated Creatinine Clearance: 42 mL/min (by C-G formula based on Cr of 0.75).   Medications:  Scheduled:  . cefTAZidime (FORTAZ)  IV  2 g Intravenous Q12H  . clidinium-chlordiazePOXIDE  1 capsule Oral TID AC & HS  . dicyclomine  20 mg Oral TID AC & HS  . enoxaparin (LOVENOX) injection  40 mg Subcutaneous Q12H  . feeding supplement  1 Container Oral TID BM  . fentaNYL  12.5 mcg Transdermal Q72H  . ketotifen  1 drop Both  Eyes BID  . megestrol  800 mg Oral Daily  . multivitamin with minerals  1 tablet Oral Daily  . pantoprazole  40 mg Oral Daily  . pregabalin  50 mg Oral Daily  . vancomycin  750 mg Intravenous Q12H  . Vitamin D (Ergocalciferol)  50,000 Units Oral Q7 days  . Warfarin - Pharmacist Dosing Inpatient   Does not apply q1800    Assessment: 69yo female with tenuous blood flow to LLE via axilloprofunda bypass.  She is on full dose Lovenox bridge to Coumadin.  INR 1.36 this AM, continued upward trend despite missed dose on 9/16 (ordered, not given/charted).  Hg is stable and pltc wnl.  No bleeding problems noted.  Goal of Therapy:  INR 2-3 Anti-Xa level 0.6-1 units/ml 4hrs after LMWH dose given Monitor platelets by anticoagulation protocol: Yes   Plan:  Continue Lovenox 40mg  SQ q12 (full dose) Coumadin 6mg  po x 1  Daily INR Watch for s/s of bleeding  Gracy Bruins, PharmD [facility]

## 2015-01-11 ENCOUNTER — Inpatient Hospital Stay (HOSPITAL_COMMUNITY): Payer: Medicare Other | Admitting: Anesthesiology

## 2015-01-11 ENCOUNTER — Encounter (HOSPITAL_COMMUNITY)
Admission: EM | Disposition: A | Discharge status: Nursing Home (Includes ICF) | Payer: Self-pay | Source: Home / Self Care | Attending: Vascular Surgery

## 2015-01-11 DIAGNOSIS — I9789 Other postprocedural complications and disorders of the circulatory system, not elsewhere classified: Secondary | ICD-10-CM

## 2015-01-11 HISTORY — PX: APPLICATION OF WOUND VAC: SHX5189

## 2015-01-11 HISTORY — PX: WOUND EXPLORATION: SHX6188

## 2015-01-11 LAB — BASIC METABOLIC PANEL
Anion gap: 6 (ref 5–15)
BUN: 31 mg/dL — ABNORMAL HIGH (ref 6–20)
CALCIUM: 8.9 mg/dL (ref 8.9–10.3)
CO2: 26 mmol/L (ref 22–32)
CREATININE: 0.78 mg/dL (ref 0.44–1.00)
Chloride: 102 mmol/L (ref 101–111)
GFR calc non Af Amer: >60 mL/min (ref >60)
Glucose, Bld: 90 mg/dL (ref 65–99)
Potassium: 4.7 mmol/L (ref 3.5–5.1)
Sodium: 134 mmol/L — ABNORMAL LOW (ref 135–145)

## 2015-01-11 LAB — CBC
HCT: 26.8 % — ABNORMAL LOW (ref 36.0–46.0)
Hemoglobin: 8.9 g/dL — ABNORMAL LOW (ref 12.0–15.0)
MCH: 27.8 pg (ref 26.0–34.0)
MCHC: 33.2 g/dL (ref 30.0–36.0)
MCV: 83.8 fL (ref 78.0–100.0)
PLATELETS: 367 10*3/uL (ref 150–400)
RBC: 3.2 MIL/uL — AB (ref 3.87–5.11)
RDW: 17.6 % — ABNORMAL HIGH (ref 11.5–15.5)
WBC: 14.7 10*3/uL — AB (ref 4.0–10.5)

## 2015-01-11 LAB — PROTIME-INR
INR: 1.52 — AB (ref 0.00–1.49)
PROTHROMBIN TIME: 18.4 s — AB (ref 11.6–15.2)

## 2015-01-11 SURGERY — WOUND EXPLORATION
Anesthesia: General | Laterality: Left

## 2015-01-11 MED ORDER — PROMETHAZINE HCL 25 MG/ML IJ SOLN: 6.2500 mg | INTRAMUSCULAR | Status: DC | PRN

## 2015-01-11 MED ORDER — HYDROMORPHONE HCL 1 MG/ML IJ SOLN
0.2500 mg | INTRAMUSCULAR | Status: DC | PRN
  Administered 2015-01-11: 0.25 mg via INTRAVENOUS

## 2015-01-11 MED ORDER — CLINIMIX E/DEXTROSE (5/15) 5 % IV SOLN
INTRAVENOUS | Status: AC
  Administered 2015-01-11: 18:00:00 via INTRAVENOUS
  Filled 2015-01-11 (×2): qty 1000

## 2015-01-11 MED ORDER — FENTANYL CITRATE (PF) 100 MCG/2ML IJ SOLN
INTRAMUSCULAR | Status: DC | PRN
  Administered 2015-01-11: 50 ug via INTRAVENOUS

## 2015-01-11 MED ORDER — WARFARIN SODIUM 5 MG PO TABS
6.0000 mg | ORAL_TABLET | Freq: Once | ORAL | Status: AC
  Administered 2015-01-11: 6 mg via ORAL
  Filled 2015-01-11 (×2): qty 1

## 2015-01-11 MED ORDER — HYDROMORPHONE HCL 1 MG/ML IJ SOLN
INTRAMUSCULAR | Status: AC
  Filled 2015-01-11: qty 1

## 2015-01-11 MED ORDER — CEFAZOLIN SODIUM-DEXTROSE 2-3 GM-% IV SOLR
INTRAVENOUS | Status: AC
  Filled 2015-01-11: qty 50

## 2015-01-11 MED ORDER — 0.9 % SODIUM CHLORIDE (POUR BTL) OPTIME
TOPICAL | Status: DC | PRN
  Administered 2015-01-11: 1000 mL

## 2015-01-11 MED ORDER — LACTATED RINGERS IV SOLN
INTRAVENOUS | Status: DC
  Administered 2015-01-11: 50 mL/h via INTRAVENOUS

## 2015-01-11 MED ORDER — LACTATED RINGERS IV SOLN
INTRAVENOUS | Status: DC | PRN
  Administered 2015-01-11: 11:00:00 via INTRAVENOUS

## 2015-01-11 MED ORDER — MIDAZOLAM HCL 2 MG/2ML IJ SOLN
INTRAMUSCULAR | Status: AC
  Filled 2015-01-11: qty 2

## 2015-01-11 MED ORDER — CEFAZOLIN SODIUM-DEXTROSE 2-3 GM-% IV SOLR
2.0000 g | INTRAVENOUS | Status: AC
  Administered 2015-01-11: 2 g via INTRAVENOUS

## 2015-01-11 MED ORDER — LIDOCAINE-EPINEPHRINE (PF) 1 %-1:200000 IJ SOLN
INTRAMUSCULAR | Status: DC | PRN
  Administered 2015-01-11: 20 mL

## 2015-01-11 MED ORDER — PROPOFOL INFUSION 10 MG/ML OPTIME
INTRAVENOUS | Status: DC | PRN
  Administered 2015-01-11: 100 ug/kg/min via INTRAVENOUS

## 2015-01-11 MED ORDER — LIDOCAINE-EPINEPHRINE (PF) 1 %-1:200000 IJ SOLN
INTRAMUSCULAR | Status: AC
  Filled 2015-01-11: qty 30

## 2015-01-11 MED ORDER — FAT EMULSION 20 % IV EMUL
240.0000 mL | INTRAVENOUS | Status: AC
  Administered 2015-01-11: 240 mL via INTRAVENOUS
  Filled 2015-01-11: qty 250

## 2015-01-11 MED ORDER — FENTANYL CITRATE (PF) 250 MCG/5ML IJ SOLN
INTRAMUSCULAR | Status: AC
  Filled 2015-01-11: qty 5

## 2015-01-11 SURGICAL SUPPLY — 23 items
CANISTER SUCTION 2500CC (MISCELLANEOUS) ×3 IMPLANT
CANISTER WOUND CARE 500ML ATS (WOUND CARE) ×3 IMPLANT
COVER SURGICAL LIGHT HANDLE (MISCELLANEOUS) ×3 IMPLANT
DRSG VAC ATS SM SENSATRAC (GAUZE/BANDAGES/DRESSINGS) ×3 IMPLANT
ELECT REM PT RETURN 9FT ADLT (ELECTROSURGICAL) ×3
ELECTRODE REM PT RTRN 9FT ADLT (ELECTROSURGICAL) ×1 IMPLANT
GAUZE SPONGE 4X4 16PLY XRAY LF (GAUZE/BANDAGES/DRESSINGS) ×3 IMPLANT
GLOVE BIOGEL PI IND STRL 7.5 (GLOVE) ×1 IMPLANT
GLOVE BIOGEL PI INDICATOR 7.5 (GLOVE) ×2
GLOVE SURG SS PI 7.5 STRL IVOR (GLOVE) ×3 IMPLANT
GOWN STRL REUS W/ TWL LRG LVL3 (GOWN DISPOSABLE) ×3 IMPLANT
GOWN STRL REUS W/TWL 2XL LVL3 (GOWN DISPOSABLE) ×3 IMPLANT
GOWN STRL REUS W/TWL LRG LVL3 (GOWN DISPOSABLE) ×6
KIT BASIN OR (CUSTOM PROCEDURE TRAY) ×3 IMPLANT
KIT ROOM TURNOVER OR (KITS) ×3 IMPLANT
NEEDLE HYPO 25GX1X1/2 BEV (NEEDLE) ×3 IMPLANT
NS IRRIG 1000ML POUR BTL (IV SOLUTION) ×3 IMPLANT
PACK GENERAL/GYN (CUSTOM PROCEDURE TRAY) IMPLANT
PACK PERIPHERAL VASCULAR (CUSTOM PROCEDURE TRAY) ×3 IMPLANT
PAD ARMBOARD 7.5X6 YLW CONV (MISCELLANEOUS) ×6 IMPLANT
PAD NEG PRESSURE SENSATRAC (MISCELLANEOUS) ×3 IMPLANT
SYR CONTROL 10ML LL (SYRINGE) ×3 IMPLANT
WATER STERILE IRR 1000ML POUR (IV SOLUTION) IMPLANT

## 2015-01-11 NOTE — Consult Note (Addendum)
WOC follow-up: Pt went to the OR on 9/20 for groin wound.  Please re-consult when first post-op dressing change is desired if Vac is re-applied. Please re-consult if further assistance is needed.  Thank-you,  Julien Girt MSN, [place], [place], [place], [place]

## 2015-01-11 NOTE — Interval H&P Note (Signed)
[facility] [place]  History and Physical Update  The patient was interviewed and re-examined.  The patient's previous History and Physical has been reviewed and is unchanged from my consult except for: interval excision of fem-fem BPG, L Ax-PFA bypass.  The patient subsequently developed a seroma at the axillary artery exposure.  The plan is to explore this wound and evacuate the seroma, VAC dressing will be applied afterward.     Adele Barthel, MD [facility] [place] Office: [phone] Pager: [phone]  01/11/2015, 8:42 AM

## 2015-01-11 NOTE — Progress Notes (Signed)
OT treatment Note   01/11/15 1142  OT Visit Information  Last OT Received On 01/11/15  Assistance Needed +1  History of Present Illness Admitted 9/7 with infected fem-fem BPG; 9/8 I& D bil groin with removal of graft; 9/9 underwent Lt axillary-femoral BPG  OT Time Calculation  OT Start Time (ACUTE ONLY) 0923  OT Stop Time (ACUTE ONLY) 0938  OT Time Calculation (min) 15 min  Precautions  Precautions Fall  Precaution Comments Wound vac in groin  Pain Assessment  Pain Assessment 0-10  Pain Score 10  Pain Location left chest area and groin  Pain Intervention(s) Monitored during session;Repositioned  Cognition  Arousal/Alertness Awake/alert  Behavior During Therapy WFL for tasks assessed/performed  Overall Cognitive Status Within Functional Limits for tasks assessed  ADL  Overall ADL's  Needs assistance/impaired  Grooming Wash/dry hands;Wash/dry face;Applying deodorant;Set up;Supervision/safety;Standing;Sitting  Lower Body Bathing Set up;Supervison/ safety (sitting and standing)  Upper Body Dressing Details (indicate cue type and reason) OT assisted with gown; pt with lines; feel she could manage a shirt with no IV  Toilet Transfer Supervision/safety;Min guard;Ambulation (Min guard-ambulation; supervision-sit to stand)  Functional mobility during ADLs Min guard  General ADL Comments Discussed tub transfer technique.   Bed Mobility  Overal bed mobility Modified Independent  Bed Mobility Supine to Sit  Supine to sit Modified independent (Device/Increase time)  Restrictions  Weight Bearing Restrictions No  Transfers  Overall transfer level Needs assistance  Transfers Sit to/from Stand  Sit to Stand Supervision  OT - End of Session  Equipment Utilized During Treatment Gait belt  Activity Tolerance Patient tolerated treatment well  Patient left in chair;with call bell/phone within reach  Nurse Communication Mobility status  OT Assessment/Plan  OT Plan Discharge plan needs to be  updated  OT Frequency (ACUTE ONLY) Min 2X/week  Follow Up Recommendations Home health OT;Supervision - Intermittent (talk of needing LTAC for Abx and nutrition.)  OT Equipment None recommended by OT  OT Goal Progression  Progress towards OT goals Progressing toward goals-updated goals  Acute Rehab OT Goals  Patient Stated Goal go home  OT Goal Formulation With patient  Time For Goal Achievement 01/18/15  Potential to Achieve Goals Good  OT General Charges  $OT Visit 1 Procedure  OT Treatments  $Self Care/Home Management  8-22 mins   Roseanne Reno, OTR/L [phone]

## 2015-01-11 NOTE — Anesthesia Postprocedure Evaluation (Signed)
  Anesthesia Post-op Note  Patient: Cynthia Bowen  Procedure(s) Performed: Procedure(s) (LRB): LEFT AXILLARY WOUND EXPLORATION AND DRAINAGE OF SEROMA (Left) APPLICATION OF NEGATIVE PRESSURE WOUND DRESSING TO LEFT AXILLA  AND LEFT GROIN (Left)  Patient Location: PACU  Anesthesia Type: MAC  Level of Consciousness: awake and alert   Airway and Oxygen Therapy: Patient Spontanous Breathing  Post-op Pain: mild  Post-op Assessment: Post-op Vital signs reviewed, Patient's Cardiovascular Status Stable, Respiratory Function Stable, Patent Airway and No signs of Nausea or vomiting  Last Vitals:  Filed Vitals:   01/11/15 1215  BP: 127/51  Pulse: 104  Temp: 36.2 C  Resp: 16    Post-op Vital Signs: stable   Complications: No apparent anesthesia complications

## 2015-01-11 NOTE — Care Management Important Message (Signed)
Important Message  Patient Details  Name: Cynthia Bowen MRN: [id number] Date of Birth: October 29, 1945   Medicare Important Message Given:  Yes-fourth notification given    Nathen May 01/11/2015, 11:29 AM

## 2015-01-11 NOTE — Anesthesia Preprocedure Evaluation (Addendum)
Anesthesia Evaluation  Patient identified by MRN, date of birth, ID band Patient awake    Reviewed: Allergy & Precautions, NPO status , Patient's Chart, lab work & pertinent test results  Airway Mallampati: II  TM Distance: >3 FB Neck ROM: Full    Dental no notable dental hx.    Pulmonary COPD, former smoker,    Pulmonary exam normal breath sounds clear to auscultation       Cardiovascular + Peripheral Vascular Disease  Normal cardiovascular exam Rhythm:Regular Rate:Normal     Neuro/Psych negative neurological ROS  negative psych ROS   GI/Hepatic Neg liver ROS, GERD  Medicated,  Endo/Other  negative endocrine ROS  Renal/GU negative Renal ROS  negative genitourinary   Musculoskeletal negative musculoskeletal ROS (+)   Abdominal   Peds negative pediatric ROS (+)  Hematology  (+) anemia ,   Anesthesia Other Findings   Reproductive/Obstetrics negative OB ROS                           Anesthesia Physical Anesthesia Plan  ASA: III  Anesthesia Plan: MAC   Post-op Pain Management:    Induction: Intravenous  Airway Management Planned: Simple Face Mask  Additional Equipment:   Intra-op Plan:   Post-operative Plan:   Informed Consent: I have reviewed the patients History and Physical, chart, labs and discussed the procedure including the risks, benefits and alternatives for the proposed anesthesia with the patient or authorized representative who has indicated his/her understanding and acceptance.   Dental advisory given  Plan Discussed with: CRNA and Surgeon  Anesthesia Plan Comments:       Anesthesia Quick Evaluation

## 2015-01-11 NOTE — Progress Notes (Signed)
[facility] NOTE  Pharmacy Consult for TPN Indication: poor NPO intake and severe protein malnutrition s/p multiple GI surgeries in the past  Allergies  Allergen Reactions  . Avelox [Moxifloxacin Hcl In Nacl] Nausea And Vomiting  . Betadine [Povidone Iodine] Itching and Rash  . Alendronate Sodium Nausea And Vomiting and Other (See Comments)    dizziness  . Aspirin Nausea Only  . Codeine Nausea And Vomiting  . Doxycycline Nausea And Vomiting  . Fluconazole Nausea And Vomiting  . Hydrocodone Nausea And Vomiting    GI distress  . Hydrocodone-Acetaminophen Nausea And Vomiting  . Morphine And Related Nausea Only  . Neurontin [Gabapentin] Other (See Comments)    Mood changes   . Nsaids Other (See Comments)    Severe gastritis & perforation - avoid NSAIDs when possible  . Quinolones Hives and Itching  . Sertraline Hcl Nausea And Vomiting and Other (See Comments)    Hallucinations   . Sulfamethoxazole Hives and Itching  . Latex Rash  . Sulfa Antibiotics Rash    Patient Measurements: Height: 5\' 5"  (165.1 cm) Weight: 85 lb 15.7 oz (39 kg) IBW/kg (Calculated) : 57   Vital Signs: Temp: 98.8 F (37.1 C) (09/20 0400) Temp Source: Oral (09/20 0400) BP: 110/57 mmHg (09/20 0400) Pulse Rate: 85 (09/20 0400) Intake/Output from previous day: 09/19 0701 - 09/20 0700 In: 900 [P.O.:880; I.V.:20] Out: 3400 [Urine:3400] Intake/Output from this shift:    Labs:  Recent Labs  01/09/15 0608 01/10/15 0516 01/11/15 0530  WBC 11.5* 12.1* 14.7*  HGB 8.2* 8.7* 8.9*  HCT 24.6* 25.8* 26.8*  PLT 289 328 367  INR 1.29 1.36 1.52*     Recent Labs  01/09/15 0608 01/10/15 0516 01/11/15 0530  NA 138 137 134*  K 4.7 5.0 4.7  CL 104 104 102  CO2 27 25 26   GLUCOSE 90 86 90  BUN 24* 30* 31*  CREATININE 0.59 0.75 0.78  CALCIUM 9.3 9.4 8.9  MG 1.8 1.9  --   PHOS 4.7* 4.7*  --   PROT 6.0* 6.6  --   ALBUMIN 2.6* 2.9*  --   AST 21 21  --   ALT 13* 17  --   ALKPHOS 59  58  --   BILITOT 0.5 0.3  --   PREALBUMIN  --  31.8  --   TRIG  --  66  --    Estimated Creatinine Clearance: 42 mL/min (by C-G formula based on Cr of 0.78).    Recent Labs  01/10/15 0020 01/10/15 0432 01/10/15 1516  GLUCAP 137* 131* 132*   Insulin Requirements in the past 24 hours:  none  Nutritional Goals:  1200-1400 kCal, 65-75 grams of protein per day per RD recommendations on 9/8  Current Nutrition:  -Clinimix E 5/15 1000 ml over 12 hours + IVFE 20% 240 ml over 12 hours - provides 50 g protein and 1190 kcal which meets 77% of protein and 99% of kcal needs -[employer] 1 container TID between meals -Regular diet- ate ~50-of meals yesterday, took all f 3 breeze supplements yesterday  Assessment: 34 YOF admitted 9/7 with bilateral groin pain- she had stents placed bilaterally in May and was having drainage from the right groin site after a fluid collection popped 2 days PTA.    Surgeries/Procedures: excision of infected fem-fem BPG, L ax-PFA bypass on 9/9. Multiple abdominal surgeries noted in 2014 as per PMH- ex lap repair of gastric perforation 07/02/2012, omental patch of perforation and gastric leak  repair 07/07/2012, repeat ex laps for leak repair in March 2014, small bowel resection, placement of jejunostomy and lysis of adhesions 11/11/2012. During this admission, she had excision of fem-fem graft done 12/31/2014  GI:  Noted RD assessment from 9/8 states patient is malnourished. Has been receiving Megace without an increase in diet, though she has remained on a regular diet with decent intake. Per nutritional assessment, she eats what she can at home. Prealbumin on 9/13 of 14.5 indicates poor nutritional status.  On 9/19 prealbumin up to 31.8. Albumin up to 2.9 She is currently on Bentyl and Librax along with PO PPI. Last BM charted 9/18. Per Dr. Lianne Moris note would like to continue TPN for 1 month to help with incision healing. She may need a follow through study to determine  why she has malabsorption issues.  Endo: no history of DM. no issues with glycemic control with change to cyclic TPN.  SSI/CBGs stopped on 9/19  Lytes: K 4.7,  1.9 CoCa ~9.78. Ca x Phos product <55. She has had no evidence of refeeding.   Renal: SCr stable  MIVF at [facility]: 100/RA  Cards:  No meds.  Hepatobil: LFTs wnl . Albumin 2.9, baseline trigs 59, 66 on 9/19.  Neuro: no acute issues  ID:   Vancomycin and ceftazidime for infected left groin with abscess. Vac in place. WBC 14.7, afebrile. Plans to continue antibiotics x1 month per ID recommendations.   Vanc 9/8>> Ceftaz 9/8>>  9/13 VT = 6 on 1g q24h>changed to 750mg  q12h 9/16 VT 15 on 750 q12h  9/7 groin wound: multiple organisms, none predominant 9/8 L&R groin wound (on zinacef): neg 9/8 blood x2: neg 9/9 urine: neg 9/9 L groin abscess (on cefazolin): neg 9/9 L groin wound: neg  AC/Heme: stared on full dose Lovenox + warfarin for tenuous perfusion of left leg via bypass- using as a patency adjunct. INR 1.52, Lovenox 40mg  q12h  Best Practices: Lovenox + warfarin  TPN Access: IR placed PICC 9/12 TPN start date: 9/12>>   Plan:  Continue 12 hour cyclic TPN in anticipation of long-term TPN provisions.   continue Clinimix E 5/15 at 1000 ml over 12 hours + 20% IVFE 240 ml over 12 hours.  This provides 1190 kcal and 50 g protein to meet ~ 77 % of protein and ~ 99 % kcal needs - PO MVI TPN labs on Thursday Eudelia Bunch, Pharm.D. [phone] 01/11/2015 7:15 AM

## 2015-01-11 NOTE — Progress Notes (Signed)
ANTICOAGULATION CONSULT NOTE - Follow Up Consult  Pharmacy Consult for Coumadin and Lovenox Indication: Tenuous circulation to LLE via bypass, anticoag as adjunct  Allergies  Allergen Reactions  . Avelox [Moxifloxacin Hcl In Nacl] Nausea And Vomiting  . Betadine [Povidone Iodine] Itching and Rash  . Alendronate Sodium Nausea And Vomiting and Other (See Comments)    dizziness  . Aspirin Nausea Only  . Codeine Nausea And Vomiting  . Doxycycline Nausea And Vomiting  . Fluconazole Nausea And Vomiting  . Hydrocodone Nausea And Vomiting    GI distress  . Hydrocodone-Acetaminophen Nausea And Vomiting  . Morphine And Related Nausea Only  . Neurontin [Gabapentin] Other (See Comments)    Mood changes   . Nsaids Other (See Comments)    Severe gastritis & perforation - avoid NSAIDs when possible  . Quinolones Hives and Itching  . Sertraline Hcl Nausea And Vomiting and Other (See Comments)    Hallucinations   . Sulfamethoxazole Hives and Itching  . Latex Rash  . Sulfa Antibiotics Rash    Patient Measurements: Height: 5\' 5"  (165.1 cm) Weight: 85 lb 15.7 oz (39 kg) IBW/kg (Calculated) : 57 Heparin Dosing Weight:   Vital Signs: Temp: 98.8 F (37.1 C) (09/20 0400) Temp Source: Oral (09/20 0400) BP: 110/57 mmHg (09/20 0400) Pulse Rate: 85 (09/20 0400)  Labs:  Recent Labs  01/09/15 0608 01/10/15 0516 01/11/15 0530  HGB 8.2* 8.7* 8.9*  HCT 24.6* 25.8* 26.8*  PLT 289 328 367  LABPROT 16.2* 16.8* 18.4*  INR 1.29 1.36 1.52*  CREATININE 0.59 0.75 0.78    Estimated Creatinine Clearance: 42 mL/min (by C-G formula based on Cr of 0.78).   Medications:  Scheduled:  . cefTAZidime (FORTAZ)  IV  2 g Intravenous Q12H  . clidinium-chlordiazePOXIDE  1 capsule Oral TID AC & HS  . dicyclomine  20 mg Oral TID AC & HS  . enoxaparin (LOVENOX) injection  40 mg Subcutaneous Q12H  . feeding supplement  1 Container Oral TID BM  . fentaNYL  12.5 mcg Transdermal Q72H  . ketotifen  1 drop  Both Eyes BID  . megestrol  800 mg Oral Daily  . multivitamin with minerals  1 tablet Oral Daily  . pantoprazole  40 mg Oral Daily  . pregabalin  50 mg Oral Daily  . vancomycin  750 mg Intravenous Q12H  . Vitamin D (Ergocalciferol)  50,000 Units Oral Q7 days  . Warfarin - Pharmacist Dosing Inpatient   Does not apply q1800    Assessment: 69yo female with tenuous blood flow to LLE via axilloprofunda bypass.  She is on full dose Lovenox bridge to Coumadin.  INR up to 1.52 this AM, continued upward trend despite missed dose on 9/16 (ordered, not given/charted).  Hg is stable and pltc wnl.  No bleeding problems noted.  Goal of Therapy:  INR 2-3 Anti-Xa level 0.6-1 units/ml 4hrs after LMWH dose given Monitor platelets by anticoagulation protocol: Yes   Plan:  Continue Lovenox 40mg  SQ q12 (full dose) Coumadin 6mg  po x 1  Daily INR Watch for s/s of bleeding  Albertina Parr, PharmD., BCPS Clinical Pharmacist Phone [phone]

## 2015-01-11 NOTE — Op Note (Signed)
    OPERATIVE NOTE   PROCEDURE: 1.  Exploration of axillary artery exposure 2.  Drainage of seroma 3.  Placement of negative pressure dressing  PRE-OPERATIVE DIAGNOSIS: Left axillary exposure seroma  POST-OPERATIVE DIAGNOSIS: same as above   SURGEON: Adele Barthel, MD  ANESTHESIA: local and MAC  ESTIMATED BLOOD LOSS: 30 cc  FINDING(S): 1.  Seroma without evidence of graft exposure 2.  Clean left groin wound with evidence of granulation   SPECIMEN(S):  none  INDICATIONS:   Cynthia Bowen is a 69 y.o. female who presents with s/p fem-fem BPG removal and L ax-PFA bypass.  The patient recently developed a fluid collection at the axillary exposure.  This patient has repeated infected fluid collections, so I felt exploration and drainage of the fluid was going to be necessary.  The patient is aware that repeated VAC changes will be needed and the graft is at risk for infection..  DESCRIPTION: After obtaining full informed written consent, the patient was brought back to the operating room and placed supine upon the operating table.  The patient received IV antibiotics prior to induction.  After obtaining adequate anesthesia, the patient was prepped and draped in the standard fashion for: left infraclavicular incision exploration.  I removed the Dermabond at the skin level and then sharply opened the lateral 4 cm of the infraclavicular incision through the prior incision.  I opened the subcutaneous tissue and immediately serous fluid decompressed.  I sucked out 50 cc of serous fluid, which was consisted with a seroma.  There did not appear to be an organized seroma.  I could not see the graft from this limited exposure.  The subcutaneous tissue demonstrated evidence of healing.  I did no feel extending the exposure was necessary.  I fashioned a VAC sponge for the geometry of this wound and then affixed the sponge with adhesive strips.  I cut a hole in the adhesive and affixed the lilypad.  The  VAC tubing was connected to the pump at 125 continuous.  The sponge became adherent.  At this point, I took down the drapes.  The left groin VAC dressing needed to be changed due leaking.  I removed the dressing and removed the sponge.  The right wound demonstrated evidence of granulation.  I fashioned a sponge for this wound.  I affixed the sponge with adhesive strips.  I cut a hole in the adhesive and affixed the lilypad.  The VAC tubing was connected to the pump at 125 continuous.  The sponge became adherent.   COMPLICATIONS: none  CONDITION: stable   Adele Barthel, MD [facility] Office: [phone] Pager: [phone]  01/11/2015, 12:11 PM

## 2015-01-11 NOTE — Transfer of Care (Signed)
Immediate Anesthesia Transfer of Care Note  Patient: Cynthia Bowen  Procedure(s) Performed: Procedure(s): LEFT AXILLARY WOUND EXPLORATION AND DRAINAGE OF SEROMA (Left) APPLICATION OF NEGATIVE PRESSURE WOUND DRESSING TO LEFT AXILLA  AND LEFT GROIN (Left)  Patient Location: PACU  Anesthesia Type:MAC  Level of Consciousness: awake  Airway & Oxygen Therapy: Patient Spontanous Breathing  Post-op Assessment: Report given to RN  Post vital signs: Reviewed and stable  Last Vitals:  Filed Vitals:   01/11/15 0400  BP: 110/57  Pulse: 85  Temp: 37.1 C  Resp: 18    Complications: No apparent anesthesia complications

## 2015-01-11 NOTE — Progress Notes (Signed)
UR Completed. Samantha Claxton, RN, BSN.  [phone] 

## 2015-01-12 ENCOUNTER — Encounter (HOSPITAL_COMMUNITY): Payer: Self-pay | Admitting: Vascular Surgery

## 2015-01-12 ENCOUNTER — Telehealth: Payer: Self-pay | Admitting: Vascular Surgery

## 2015-01-12 ENCOUNTER — Telehealth: Payer: Self-pay | Admitting: Emergency Medicine

## 2015-01-12 LAB — PROTIME-INR
INR: 1.99 — ABNORMAL HIGH (ref 0.00–1.49)
Prothrombin Time: 22.5 seconds — ABNORMAL HIGH (ref 11.6–15.2)

## 2015-01-12 LAB — CBC
HEMATOCRIT: 27 % — AB (ref 36.0–46.0)
HEMOGLOBIN: 8.8 g/dL — AB (ref 12.0–15.0)
MCH: 27.2 pg (ref 26.0–34.0)
MCHC: 32.6 g/dL (ref 30.0–36.0)
MCV: 83.6 fL (ref 78.0–100.0)
Platelets: 335 10*3/uL (ref 150–400)
RBC: 3.23 MIL/uL — AB (ref 3.87–5.11)
RDW: 17.7 % — ABNORMAL HIGH (ref 11.5–15.5)
WBC: 12.2 10*3/uL — ABNORMAL HIGH (ref 4.0–10.5)

## 2015-01-12 MED ORDER — OXYCODONE-ACETAMINOPHEN 5-325 MG PO TABS: 1.0000 | ORAL_TABLET | Freq: Four times a day (QID) | ORAL | Status: DC | PRN

## 2015-01-12 MED ORDER — WARFARIN SODIUM 4 MG PO TABS: 4.0000 mg | ORAL_TABLET | Freq: Every day | ORAL | Status: DC

## 2015-01-12 MED ORDER — HEPARIN SOD (PORK) LOCK FLUSH 100 UNIT/ML IV SOLN
250.0000 [IU] | INTRAVENOUS | Status: DC | PRN
  Filled 2015-01-12: qty 3

## 2015-01-12 MED ORDER — FENTANYL 12 MCG/HR TD PT72: 12.5000 ug | MEDICATED_PATCH | TRANSDERMAL | Status: DC

## 2015-01-12 MED ORDER — WARFARIN SODIUM 4 MG PO TABS: 4.0000 mg | ORAL_TABLET | Freq: Once | ORAL | Status: DC

## 2015-01-12 MED ORDER — ADULT MULTIVITAMIN W/MINERALS CH: 1.0000 | ORAL_TABLET | Freq: Every day | ORAL | Status: AC

## 2015-01-12 MED ORDER — VANCOMYCIN HCL IN DEXTROSE 750-5 MG/150ML-% IV SOLN: 750.0000 mg | Freq: Two times a day (BID) | INTRAVENOUS | Status: DC

## 2015-01-12 MED ORDER — MEGESTROL ACETATE 40 MG/ML PO SUSP: 800.0000 mg | Freq: Every day | ORAL | Status: DC

## 2015-01-12 MED ORDER — DEXTROSE 5 % IV SOLN: 2.0000 g | Freq: Two times a day (BID) | INTRAVENOUS | Status: DC

## 2015-01-12 MED ORDER — FAT EMULSION 20 % IV EMUL
240.0000 mL | INTRAVENOUS | Status: DC
  Filled 2015-01-12: qty 250

## 2015-01-12 MED ORDER — CLINIMIX E/DEXTROSE (5/15) 5 % IV SOLN
INTRAVENOUS | Status: DC
  Filled 2015-01-12 (×2): qty 1000

## 2015-01-12 MED ORDER — HEPARIN SOD (PORK) LOCK FLUSH 100 UNIT/ML IV SOLN
250.0000 [IU] | Freq: Every day | INTRAVENOUS | Status: DC
  Filled 2015-01-12: qty 3

## 2015-01-12 NOTE — Progress Notes (Signed)
Pt stable for discharge to [facility] facility. She will be discharged to [facility]. She is waiting for ambulance to transport her. Report called to [place], RN at [facility]. Wound vacs to left groin and left chest areas removed and wet to dry dressings applied at both sites per MD's orders. Wound vac to be restarted at outside facility.

## 2015-01-12 NOTE — Telephone Encounter (Signed)
Still in hsp- wcb at a later time

## 2015-01-12 NOTE — Telephone Encounter (Signed)
-----   Message from Gabriel Earing, [place] sent at 01/12/2015 10:35 AM EDT ----- S/p removal of fem fem bypass and left axilla deep femoral artery bypass with 8 mm PTFE graft.  Pt has wound vacs on the left chest and left groin.  She needs f/u with Dr. Bridgett Larsson on 01/21/15 and weekly thereafter until Dr. Bridgett Larsson states otherwise.  Thanks, Aldona Bar

## 2015-01-12 NOTE — Consult Note (Addendum)
WOC follow-up: Pt states Vac machine has alarmed all night.  Dressing falling off left chest wound at this time; appears to have been pulled loose.  Full thickness post-op wound .8X2X.2cm, beefy red with exposed bone visible.  Small amt yellow drainage in the cannister, no odor.  Applied one piece of black sponge, and another larger piece over the site for the track pad. Pt tolerated with minimal amt discomfort.   Left groin vac dressing changed; full thickness wound 3X.2X.2cm, beefy red with large amt yellow drainage in cannister, no odor. Applied one piece of black sponge, and another larger piece over the site for the track pad. Pt tolerated with minimal amt discomfort.  Plan for dressing change to both sites on Friday. Each Vac is on a separate machine at this time.  Please advise if patient will need 2 machines upon discharge when completing vac approval paperwork. Julien Girt MSN, RN, [place], [place], [place]

## 2015-01-12 NOTE — Telephone Encounter (Signed)
Call the patient and let her know we do not have any control over her inpatient care. We are happy to see her once she leaves the hospital.

## 2015-01-12 NOTE — Discharge Instructions (Signed)
Information on my medicine - Coumadin   (Warfarin)  This medication education was reviewed with me or my healthcare representative as part of my discharge preparation.  The pharmacist that spoke with me during my hospital stay was:  Sheral Apley.   Why was Coumadin prescribed for you? Coumadin was prescribed for you because you have a blood clot or a medical condition that can cause an increased risk of forming blood clots. Blood clots can cause serious health problems by blocking the flow of blood to the heart, lung, or brain. Coumadin can prevent harmful blood clots from forming. As a reminder your indication for Coumadin is:   Deep Vein Thrombosis Treatment  What test will check on my response to Coumadin? While on Coumadin (warfarin) you will need to have an INR test regularly to ensure that your dose is keeping you in the desired range. The INR (international normalized ratio) number is calculated from the result of the laboratory test called prothrombin time (PT).  If an INR APPOINTMENT HAS NOT ALREADY BEEN MADE FOR YOU please schedule an appointment to have this lab work done by your health care provider within 7 days. Your INR goal is usually a number between:  2 to 3 or your provider may give you a more narrow range like 2-2.5.  Ask your health care provider during an office visit what your goal INR is.  What  do you need to  know  About  COUMADIN? Take Coumadin (warfarin) exactly as prescribed by your healthcare provider about the same time each day.  DO NOT stop taking without talking to the doctor who prescribed the medication.  Stopping without other blood clot prevention medication to take the place of Coumadin may increase your risk of developing a new clot or stroke.  Get refills before you run out.  What do you do if you miss a dose? If you miss a dose, take it as soon as you remember on the same day then continue your regularly scheduled regimen the next day.  Do not take two doses of  Coumadin at the same time.  Important Safety Information A possible side effect of Coumadin (Warfarin) is an increased risk of bleeding. You should call your healthcare provider right away if you experience any of the following: ? Bleeding from an injury or your nose that does not stop. ? Unusual colored urine (red or dark brown) or unusual colored stools (red or black). ? Unusual bruising for unknown reasons. ? A serious fall or if you hit your head (even if there is no bleeding).  Some foods or medicines interact with Coumadin (warfarin) and might alter your response to warfarin. To help avoid this: ? Eat a balanced diet, maintaining a consistent amount of Vitamin K. ? Notify your provider about major diet changes you plan to make. ? Avoid alcohol or limit your intake to 1 drink for women and 2 drinks for men per day. (1 drink is 5 oz. wine, 12 oz. beer, or 1.5 oz. liquor.)  Make sure that ANY health care provider who prescribes medication for you knows that you are taking Coumadin (warfarin).  Also make sure the healthcare provider who is monitoring your Coumadin knows when you have started a new medication including herbals and non-prescription products.  Coumadin (Warfarin)  Major Drug Interactions  Increased Warfarin Effect Decreased Warfarin Effect  Alcohol (large quantities) Antibiotics (esp. Septra/Bactrim, Flagyl, Cipro) Amiodarone (Cordarone) Aspirin (ASA) Cimetidine (Tagamet) Megestrol (Megace) NSAIDs (ibuprofen, naproxen, etc.) Piroxicam (  Feldene) °Propafenone (Rythmol SR) °Propranolol (Inderal) °Isoniazid (INH) °Posaconazole (Noxafil) Barbiturates (Phenobarbital) °Carbamazepine (Tegretol) °Chlordiazepoxide (Librium) °Cholestyramine (Questran) °Griseofulvin °Oral Contraceptives °Rifampin °Sucralfate (Carafate) °Vitamin K  ° °Coumadin® (Warfarin) Major Herbal Interactions  °Increased Warfarin Effect Decreased Warfarin Effect  °Garlic °Ginseng °Ginkgo biloba Coenzyme Q10 °Green  tea °St. John’s wort   ° °Coumadin® (Warfarin) FOOD Interactions  °Eat a consistent number of servings per week of foods HIGH in Vitamin K °(1 serving = ½ cup)  °Collards (cooked, or boiled & drained) °Kale (cooked, or boiled & drained) °Mustard greens (cooked, or boiled & drained) °Parsley *serving size only = ¼ cup °Spinach (cooked, or boiled & drained) °Swiss chard (cooked, or boiled & drained) °Turnip greens (cooked, or boiled & drained)  °Eat a consistent number of servings per week of foods MEDIUM-HIGH in Vitamin K °(1 serving = 1 cup)  °Asparagus (cooked, or boiled & drained) °Broccoli (cooked, boiled & drained, or raw & chopped) °Brussel sprouts (cooked, or boiled & drained) *serving size only = ½ cup °Lettuce, raw (green leaf, endive, romaine) °Spinach, raw °Turnip greens, raw & chopped  ° °These websites have more information on Coumadin (warfarin):  www.coumadin.com; °www.ahrq.gov/consumer/coumadin.htm; ° ° °

## 2015-01-12 NOTE — Care Management Note (Addendum)
Case Management Note  Patient Details  Name: Cynthia Bowen MRN: [id number] Date of Birth: 02-16-1946  Subjective/Objective:      Pt admitted with groin pain.    5 days s/p left axilla deep femoral artery bypass with 8 mm PTFE graft and  1. Extended repeat bilateral femoral artery exposure 2. Excision of infected femorofemoral graft 3. Bovine patch angioplasty of bilateral common femoral artery  Left leg angiogram    Action/Plan:  Pt is from home, active with [place].  CM received consult for LTAC.   CM assessed pt and educated on the safety risk with going home without 24 hour support. In addition: [employer] also met with pt at bedside and discussed the importance of a safe discharge home plan, at this time resource agrees with [facility] placement due to the complexity of care needed post discharge. Both [facility] and [facility] liaison met with pt 01/04/15 while pt was on 3S. Pt informed CM that she is agreeable to discharge to [facility]. CM contacted [facility] liaison and was informed that bed is available as of today 01/05/15 and that pt has been accepted based on; TPN, wound vac and IV antibotics. CM contacted MD and informed of bed acceptance at [facility]. Per MD; pt is at a high risk of rupture related to graft from surgery and wound vac, therefore pt will need to remain at facility for wound stability. CM informed [facility], bed availability/appropriatness will have to be redetermined when pt is medically ready for discharge.    Addendum 01/12/2015:  Pt had additional wound vac placed 01/11/15  Expected Discharge Date:                  Expected Discharge Plan:  [facility] ([facility]   In-House Referral:     Discharge planning Services  CM Consult  Post Acute Care Choice:    Choice offered to:  Patient  DME Arranged:  N/A DME Agency:  NA  HH Arranged:    [facility] Agency:     Status of Service:  Complete, will sign off  Disposition: [facility]  Medicare  Important Message Given:  Yes-fourth notification given Date Medicare IM Given:    Medicare IM give by:    Date Additional Medicare IM Given:    Additional Medicare Important Message give by:     If discussed at [place] of Stay Meetings, dates discussed:    Additional Comments: 01/12/2015  Pt signed COBRA form for transport to facility; consent obtained by bedside nurse [facility].  Pt brother in law/sister notified that pt will transport to [facility] today.  Pt contacted [facility] prior to discharge order being written, post MD assessment this am. CMA contacted [facility] and informed discharge order not yet written, case closed by [facility].  At the time case was called into [facility],  CM was not aware of pending discharge.  CM assessed pt; pt is tearful stating she is being kicked out,   Pt had multiple complaints with staff and equipment and also stated "I don't want to be here".  CM assured pt that she was not being kicked out; pt was medically ready to discharge to [name] per MD. Discharge plan was for pt to discharge to [facility] (facility of choice for pt) when medically ready.  Pt currently has the following intensity; IV antibotics, 2 wound vacs, TPN.  Pt would be alone 24 hours a day if discharged home, current [facility] voiced concerns to pt and CM  regarding resuming services upon pt returning home with current intensity.   Brother in Sports coach and sister are in town but pt stays alone, safe disposition plan determined to be post acute facility in agreement with pt and family on multiple occasions.  CM reminded pt that discharge disposition is ultimately up to pts discretion, however CM reiterated concerns with pt returning home with current intensity.  CM reminded pt multiple times of prior conversations of safe discharge planning, also explained to pt that  if she goes home she would risk serious complications, this was also communicated to pt by MD, brother in law/sister, [facility] and nursing staff;  CM  spoke with sister/brother in law on the phone and communicated that pt will likely be discharged to [facility] today pending discharge.  Cynthia Bowen Chief Operating Officer; met with pt bedside with CM in the room, pt  is agreeable to discharge  to  [facility] today.  Pt relayed her concerns to liaison with facility, liaison reassured pt and provided options for pt is care was not as expected. Pt is not interested with pursuing other LTAC/SNF options at this time, stated that if she did not go to [facility], she could just go home and take care of her self.  Discharge orders received post lengthy discusssion with pt, [facility] is actively working on transport to facility.  Cynthia Labrador, RN 01/12/2015, 11:03 AM

## 2015-01-12 NOTE — Progress Notes (Signed)
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  Continue TPN until IV abx discontinued  Continue Megace   Continue encouraging PO intact  Bilateral chronic SFA occlusion  Maximal medical management  Pt is not a candidate for fem-pop bypasses in setting of 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2 C)  TempSrc:  Oral Oral Oral  Resp: 18 18 18 18   Height:      Weight:      SpO2: 99% 100% 100% 100%    Intake/Output Summary (Last 24 hours) at 01/12/15 1023 Last data filed at 01/12/15 0342  Gross per 24 hour  Intake    460 ml  Output   1720 ml  Net  -1260 ml   PULM  CTAB CV  RRR GI  soft, NTND VASC  Both feet viable, L groin clean with some  granulation yesterday, L axillary exposure did not demonstrate chronic seroma cavity and only serous fluid in pocket, palpable L ax-PFA graft pulse  Laboratory CBC    Component Value Date/Time   WBC 12.2* 01/12/2015 0603   WBC 6.3 10/01/2014 1434   WBC 8.2 08/26/2012 1300   HGB 8.8* 01/12/2015 0603   HGB 10.6* 10/01/2014 1434   HGB 10.6* 08/26/2012 1300   HCT 27.0* 01/12/2015 0603   HCT 32.8* 10/01/2014 1434   HCT 31.2* 08/26/2012 1300   PLT 335 01/12/2015 0603   PLT 516* 08/26/2012 1300    BMET    Component Value Date/Time   NA 134* 01/11/2015 0530   NA 138 08/26/2012 1300   K 4.7 01/11/2015 0530   K 4.0 08/26/2012 1300   CL 102 01/11/2015 0530   CL 108* 08/26/2012 1300   CO2 26 01/11/2015 0530   CO2 24 08/26/2012 1300   GLUCOSE 90 01/11/2015 0530   GLUCOSE 83 08/26/2012 1300   BUN 31* 01/11/2015 0530   BUN 29* 08/26/2012 1300   BUN 4 08/19/2009   CREATININE 0.78 01/11/2015 0530   CREATININE 0.65 10/01/2014 1420   CREATININE 0.79 08/26/2012 1300   CALCIUM 8.9 01/11/2015 0530   CALCIUM 9.9 08/26/2012 1300   GFRNONAA >60 01/11/2015 0530   GFRNONAA >89 07/16/2014 0948   GFRNONAA >60 08/26/2012 1300   GFRAA >60 01/11/2015 0530   GFRAA >89 07/16/2014 9937  GFRAA >60 08/26/2012 [place], MD [facility] Office: [phone] Pager: [phone]  01/12/2015, 10:23 AM

## 2015-01-12 NOTE — Telephone Encounter (Signed)
Patient states that she is currently in the hospital and they are not treating her right. She wants Dr. Everlene Farrier or some other provider to do something about it so she can get out of the hospital today. Is there anything we can do?  [phone]

## 2015-01-12 NOTE — Discharge Summary (Signed)
Discharge Summary     Cynthia Bowen [birth year] 69 y.o. 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 female who presents with chief complaint: routine follow-up. She denies any fever or chills or drainage from left groin. The patient's legs symptoms have improved. The patient's symptoms are: intermittent claudication. Her sx are limited by her limited ambulation. The patient's treatment regimen currently included: maximal medical management and walking plan.  Patient previous underwent. 1. 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 R iliofem EA w/ DPA, R to L fem-fem BPG (Dr. Donnetta Hutching) on 09/05/14 for L CIA stent occlusion  Hospital Course:  The patient was admitted to the hospital and taken to the operating room on 12/29/2014 and underwent:  Incision and drainage bilateral groin abscesses  Right groin negative pressure dressing placement  Percutaneous cannulation of femorofemoral graft Left leg runoff  On 12/31/14:   left axilla deep femoral artery bypass with 8 mm PTFE graft  And Extended repeat bilateral femoral artery exposure Excision of infected femorofemoral graft Bovine patch angioplasty of bilateral common femoral artery  Left leg angiogram  On 01/11/15: Exploration of axillary artery exposure Drainage of seroma Placement of negative pressure dressing   Infectious disease was consulted.  Her abx were changed from ancef to vancomycin and Ceftaz.    On 12/30/14, She did have LE duplex with findings of DVT.  She also had a left upper extremity arterial duplex revealed patent biphasic flow.  She was then taken to the OR on 12/29/14 (see  details above).  Dr. Bridgett Larsson did d/w with her that she is at very high risk of needing an amputation if the reconstruction cannot be done.  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  She was aware of this and agreed with proceeding.  She was started on TPN for severe malnutrition after placement of PICC to help with wound healing.  She will have TPN for a total of a month.  At some point, the patient may need to consider a SB follow through study to determine why she has just significant malabsorption   Dr. Bridgett Larsson suspects long-term abx will be needed. There is some residual dacron in the R iliofemoral artery that cannot safely be removed as the retroperitoneum is densely adherent. Additionally, the aortic stent in place is a covered (PTFE) stent so that might be contaminated if blood cultures come back positive. Fortunately, she had an adequate target in the L PFA for the axilloprofunda, so the L leg could be salvaged. This also is graft so vulnerable for infection. Will need to start anticoagulation during this admission as a patency adjunct for the axilloprofunda bypass. This bypass has to last a minimum of 1 year before any consideration of a redo femorofemoral bypass with autologous tissue.  Post operative ABI's on 01/04/15:  RIGHT    LEFT    PRESSURE WAVEFORM  PRESSURE WAVEFORM  BRACHIAL N/A IV Triphasic BRACHIAL 133 Triphaisc  DP 77 monophasic DP 70 Monophasic  PT 0 absent PT 0  absent    RIGHT LEFT  ABI 0.58 0.53       She does have bilateral PAD known bilateral SFA occlusion with collateral flow to feet via profunda femoral artery collaterals. Left leg now dependent on L ax-PFA bypass. This bypass has limited patencies, so will proceed with  anticoagulation and load of coumadin to therapeutic prior to bypass to improve her chances of keeping her left leg. She is at risk for left hip dysarticulation if the ax-PFA graft occludes.  She was started on coumadin for tenuous blood flow to left leg via axilloprofunda bypass with anticoagulation as an adjunct.  Her target INR is 2.0-2.5.  Her Lovenox is discontinued on discharge as her INR is 1.99.  On 01/07/15, her left groin wound did have some granulation tissue present.  On 01/09/15, she did have a new buldge in the left infraclavicular area, which was soft and not pulsatile and without erythema.  An u/s was done and no obvious evidence of pseudoaneurysm noted in the left clavicular area. On 01/11/15, she was taken back to the OR (see above for details).  She was found to have a seroma without evidence of graft exposure.  The left groin was clean with evidence of granulation.  By 01/12/15, her INR was essentially therapeutic at 1.99.  Her leukocytosis has improved.  Fem-fem BPG Excision  Continue IV abx (Fortaz, Vancomycin) x 1 full month per ID (another 18 days)  Will likely need some degree of chronic suppression given persistent Dacron patch in R groin and covered stent in aorta (for subtotal aortic stenosis)  Right groin: essential healed, keep clean bandage on incision  Left groin: continue VAC dressing: M-W-F 125 continuous  Left axillary exposure: continue VAC dressing: M-W-F 125 continuous  Severe protein malnutrition due to prior GI fistula and chronic medical illness  Continue TPN until IV abx discontinued  Continue Megace   Continue encouraging PO intact  Bilateral chronic SFA occlusion  Maximal medical management  Pt is not a candidate for fem-pop bypasses in setting of infected fem-fem BPG with compromised common femoral arteries  Possible depression  Pt's affect and mood have deteriorated over the extended hospitalization  I suspect she is have  difficulties dealing with extend period of illness (prior gastric perforation and extended period of recovery) and current period (subtotal aortic stenosis with complications of infected fem-fem BPG)  Evaluation by Psychiatry recommended  The remainder of the hospital course consisted of increasing mobilization and increasing intake of solids without difficulty.  CBC    Component Value Date/Time   WBC 12.2* 01/12/2015 0603   WBC 6.3 10/01/2014 1434   WBC 8.2 08/26/2012 1300   RBC 3.23* 01/12/2015 0603   RBC 3.75* 10/01/2014 1434   RBC 3.73* 08/26/2012 1300   RBC 2.75* 08/06/2012 0800   HGB 8.8* 01/12/2015 0603   HGB 10.6* 10/01/2014 1434   HGB 10.6* 08/26/2012 1300   HCT 27.0* 01/12/2015 0603   HCT 32.8* 10/01/2014 1434   HCT 31.2* 08/26/2012 1300   PLT 335 01/12/2015 0603   PLT 516* 08/26/2012 1300   MCV 83.6 01/12/2015 0603   MCV 87.6 10/01/2014 1434   MCV 84 08/26/2012 1300   MCH 27.2 01/12/2015 0603   MCH 28.4 10/01/2014 1434   MCH 28.3 08/26/2012 1300   MCHC 32.6 01/12/2015 0603   MCHC 32.4 10/01/2014 1434   MCHC 33.9 08/26/2012 1300   RDW 17.7* 01/12/2015 0603   RDW 17.1* 08/26/2012 1300   LYMPHSABS 2.3 01/10/2015 0516  LYMPHSABS 1.4 08/26/2012 1300   MONOABS 1.1* 01/10/2015 0516   MONOABS 0.3 08/26/2012 1300   EOSABS 0.2 01/10/2015 0516   EOSABS 0.1 08/26/2012 1300   BASOSABS 0.0 01/10/2015 0516   BASOSABS 0.0 08/26/2012 1300    BMET    Component Value Date/Time   NA 134* 01/11/2015 0530   NA 138 08/26/2012 1300   K 4.7 01/11/2015 0530   K 4.0 08/26/2012 1300   CL 102 01/11/2015 0530   CL 108* 08/26/2012 1300   CO2 26 01/11/2015 0530   CO2 24 08/26/2012 1300   GLUCOSE 90 01/11/2015 0530   GLUCOSE 83 08/26/2012 1300   BUN 31* 01/11/2015 0530   BUN 29* 08/26/2012 1300   BUN 4 08/19/2009   CREATININE 0.78 01/11/2015 0530   CREATININE 0.65 10/01/2014 1420   CREATININE 0.79 08/26/2012 1300   CALCIUM 8.9 01/11/2015 0530   CALCIUM 9.9 08/26/2012 1300     GFRNONAA >60 01/11/2015 0530   GFRNONAA >89 07/16/2014 0948   GFRNONAA >60 08/26/2012 1300   GFRAA >60 01/11/2015 0530   GFRAA >89 07/16/2014 0948   GFRAA >60 08/26/2012 1300       Discharge Instructions    Call MD for:  redness, tenderness, or signs of infection (pain, swelling, bleeding, redness, odor or green/yellow discharge around incision site)    Complete by:  As directed      Call MD for:  severe or increased pain, loss or decreased feeling  in affected limb(s)    Complete by:  As directed      Call MD for:  temperature >100.5    Complete by:  As directed      Change dressing (specify)    Complete by:  As directed   Dry dressing to right groin daily.     Diet general    Complete by:  As directed      Discharge instructions    Complete by:  As directed   TPN:  Continue 12 hour cyclic TPN in anticipation of long-term TPN provisions.  continue Clinimix E 5/15 at 1000 ml over 12 hours + 20% IVFE 240 ml over 12 hours. This provides 1190 kcal and 50 g protein to meet ~ 77 % of protein and ~ 99 % kcal needs - PO MVI TPN labs on Thursday     Discharge instructions    Complete by:  As directed   Coumadin dosing to change pending INR.  INR goal is 2.0-2.5.     Discharge instructions    Complete by:  As directed   Antibiotics and TPN to be continued for a total of one month with last dosing on 01/30/15.     Discharge wound care:    Complete by:  As directed   Wound vac to left lateral chest incision and left groin at 150mmHg continuous to be changed T/T/S. Also, Dry dressing to right groin.  Change daily.     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 Groin abscess 12/30/2014  . Groin pain 12/29/2014  . Wound drainage-Right groin  11/15/2014  . Ischemia of lower extremity 09/05/2014  . Visit for wound check 09/03/2014  . Abdominal aortic stenosis 09/03/2014  . PAD (peripheral artery disease) 08/26/2014  . Discoloration of skin- Left Heel 08/13/2014  . Left foot pain- Heel 08/13/2014  . Leg heaviness-Bilateral 08/13/2014  . Protein-calorie malnutrition, severe 06/04/2014  . Clostridium difficile diarrhea 06/03/2014  . Cellulitis of hand 06/02/2014  . Hypokalemia 06/02/2014  . Osteoporosis 05/22/2014  . Recurrent abdominal pain--multiple causes, status post GI surgery with multiple complications 1610 96/07/5407  . Abnormal CT scan 02/08/2014  . Atherosclerosis of native arteries of extremity with intermittent claudication 01/15/2014  . Nephrolithiasis-asymptomatic/noted on scan 10/08/2013  . Other and unspecified ovarian cyst--noted on scans 2014 2015/asymptomatic 10/08/2013  . Pancreatic lesion-6 mm noted on [facility] at [facility] 10/08/2013  . Femoral artery stenosis-bilateral noted CT at [facility] 2015 10/08/2013  . Protein calorie malnutrition 01/15/2013  . PE (pulmonary embolism) 08/05/2012  . Incarcerated epigastric hernia s/p primary repair 07/02/2012 07/11/2012  . 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 [id number] 07/10/2012  . Pericardial effusion 04/17/2012  . COPD GOLD I 01/29/2012  . Back pain DDD S/p surgery [place] 1993 12/29/2011  . Depression with anxiety 04/02/2011  . GERD (gastroesophageal reflux disease) 04/02/2011  . Osteoarthritis 04/02/2011  . Cervical neck pain with evidence of disc disease-s/p surgery [id number] 04/02/2011  . Fibromyalgia-[place] 2004 04/02/2011   Past Medical History  Diagnosis Date  . COPD (chronic obstructive pulmonary disease)   . Pneumonia 12-2011  . GERD (gastroesophageal reflux disease)   .  Headache(784.0)   . Arthritis     osteoarthritis  . Allergy   . Depression   . Neuromuscular disorder   . Osteoporosis   . Bronchitis     CURRENTLY AS OF 06/30/12 - HAS COUGH AND FINISHED ANTIBIOTIC FOR BRONCHITIS  . Fibromyalgia   . Pain     ABDOMINAL PAIN AND NAUSEA  . Pain     SOMETIMES PAIN RIGHT EAR AND NECK--STATES CAUSED BY A "LUMP" ON BACK OF EAR--USES KENALOG CREAM TOPICALLY AS NEEDED.  Marland Kitchen Gastrocutaneous fistula   . Anemia   . Anxiety   . Blood transfusion without reported diagnosis   . Heart murmur     young  . Peripheral vascular disease     hx  ?leg  . History of kidney stones        Medication List    STOP taking these medications        amoxicillin-clavulanate 875-125 MG per tablet  Commonly known as:  AUGMENTIN     cephALEXin 500 MG capsule  Commonly known as:  KEFLEX     zoster vaccine live (PF) 19400 UNT/0.65ML injection  Commonly known as:  ZOSTAVAX      TAKE these medications        cefTAZidime 2 g in dextrose 5 % 50 mL  Inject 2 g into the vein every 12 (twelve) hours.     clidinium-chlordiazePOXIDE 5-2.5 MG per capsule  Commonly known as:  LIBRAX  Take 1 capsule by mouth 4 (four) times daily -  before meals and at bedtime.     dicyclomine 20 MG tablet  Commonly known as:  BENTYL  TAKE 1 TABLET (20 MG TOTAL) BY MOUTH 4 (FOUR) TIMES DAILY - BEFORE MEALS AND AT BEDTIME.     feeding supplement Liqd  Take 1 Container by mouth 3 (three) times daily between meals.     fentaNYL 12 MCG/HR  Commonly known as:  Albia - dosed mcg/hr  Place 1 patch (12.5 mcg total) onto the skin every 3 (three) days.     fluocinonide cream 0.05 %  Commonly known as:  LIDEX  Apply 1 application topically 2 (two) times daily as needed.     GAS-X EXTRA STRENGTH PO  Take 1 tablet by mouth 3 (three) times daily after meals.     ketotifen 0.025 % ophthalmic solution  Commonly known as:  ZADITOR  Place 1 drop into both eyes 2 (two) times daily.     LYRICA 50  MG capsule  Generic drug:  pregabalin  Take 50 mg by mouth daily.     megestrol 40 MG/ML suspension  Commonly known as:  MEGACE  Take 20 mLs (800 mg total) by mouth daily.     multivitamin with minerals Tabs tablet  Take 1 tablet by mouth daily.     ondansetron 4 MG tablet  Commonly known as:  ZOFRAN  Take 4 mg by mouth every 8 (eight) hours as needed for nausea or vomiting.     oxyCODONE-acetaminophen 5-325 MG per tablet  Commonly known as:  PERCOCET/ROXICET  Take 1-2 tablets by mouth every 6 (six) hours as needed for moderate pain.     polyethylene glycol powder powder  Commonly known as:  GLYCOLAX/MIRALAX  Take 17 g by mouth 2 (two) times daily as needed.     PROAIR HFA 108 (90 BASE) MCG/ACT inhaler  Generic drug:  albuterol     ranitidine 150 MG tablet  Commonly known as:  ZANTAC  TAKE 1 TABLET TWICE DAILY     Vancomycin 750 MG/150ML Soln  Commonly known as:  VANCOCIN  Inject 150 mLs (750 mg total) into the vein every 12 (twelve) hours.     Vitamin D (Ergocalciferol) 50000 UNITS Caps capsule  Commonly known as:  DRISDOL  Take 1 capsule (50,000 Units total) by mouth every 7 (seven) days.     warfarin 4 MG tablet  Commonly known as:  COUMADIN  Take 1 tablet (4 mg total) by mouth daily at 6 PM.        Prescriptions given: 1.  No prescriptions given as she will be going to [place], but there are new meds added to her regimen.  Instructions: 1.  Coumadin 4mg  daily-dose may vary pending INR for target 2.0-2.5 2.  TPN:  Continue 12 hour cyclic TPN in anticipation of long-term TPN provisions.  continue Clinimix E 5/15 at 1000 ml over 12 hours + 20% IVFE 240 ml over 12 hours. This provides 1190 kcal and 50 g protein to meet ~ 77 % of protein and ~ 99 % kcal needs - PO MVI TPN labs on Thursday (continue TPN for a total on one month to end on 01/30/15) 3. Continue IV ABx for a total of one month with last dosing on 01/30/15.  Pt should follow up with Dr.  Johnnye Sima prior to this date as pt may need long term Abx due to persistent Dacron patch in R groin and covered stent in aorta (for subtotal aortic stenosis) 4.  Wound vac to left chest and left groin at 139mmHg continuous to be changed T/T/S 5.  Monitor Vancomycin troughs and change dose accordingly 6.  Continue PT/OT therapy   Disposition: LTACH  Patient's condition: is Fair  Follow up: 1. Dr. Bridgett Larsson in 1 week on 01/21/15 and weekly thereafter. 2. Dr. Johnnye Sima (ID) in 1-2 weeks-pt needs f/u for graft infection and possible long term abx for  residual dacron patch (see above).   Leontine Locket, PA-C [facility] [phone] 01/12/2015  10:39 AM   Addendum  I have independently interviewed and examined the patient, and I agree with the physician assistant's discharge summary.  This patient had an infected femorofemoral graft excised this admission and left leg revascularization with a left axilloprofunda bypass.  She is at high risk of graft infection, so long-term antibiotics was recommended by ID.  Pt is getting wound VAC to Left axillary exposure and left groin wound to try to get them to heal.  This patient has been extremely slow to heal due chronic malnutrition due to poor intake and possible malabsorption.  I have repeated discussed with her that her nutrition was essential to healing her prior femorofemoral graft.  I believe that her malnutrition greatly contributed to her femorofemoral graft infection.   The patient will continue with a total of one month's worth of antibiotics and TPN.  Her wound will be re-evaluated weekly in the office.  She will also need to follow up with Dr. Johnnye Sima, ID, in another 1-2 weeks.  Adele Barthel, MD [facility] [place] Office: [phone] Pager: [phone]  01/12/2015, 2:29 PM   - For VQI Registry use --- Instructions: Press F2 to tab through selections.  Delete question if not applicable.   Post-op:    Wound infection: No  Graft infection: No  Transfusion: No  If yes, n/a units given New Arrhythmia: No Ipsilateral amputation: No, [ ]  Minor, [ ]  BKA, [ ]  AKA Discharge patency: [ x] Primary, [ ]  Primary assisted, [ ]  Secondary, [ ]  Occluded Patency judged by: [ x] Dopper only, [x ] Palpable graft pulse, [ ]  Palpable distal pulse, [ ]  ABI inc. > 0.15, [ ]  Duplex Discharge ABI: R 0.58, L 0.53 Discharge TBI: R , L  D/C Ambulatory Status: Ambulatory  Complications: MI: No, [ ]  Troponin only, [ ]  EKG or Clinical CHF: No Resp failure:No, [ ]  Pneumonia, [ ]  Ventilator Chg in renal function: No, [ ]  Inc. Cr > 0.5, [ ]  Temp. Dialysis, [ ]  Permanent dialysis Stroke: No, [ ]  Minor, [ ]  Major Return to OR: Yes (Exploration of axillary artery exposure with wound vac, exp left groin and placement of wound vac) Reason for return to OR:see above  Discharge medications: Statin use:  no ASA use:  no Plavix use:  no Beta blocker use: no Coumadin use: yes

## 2015-01-12 NOTE — Progress Notes (Signed)
ANTICOAGULATION CONSULT NOTE - Follow Up Consult  Pharmacy Consult for Coumadin and Lovenox Indication: Tenuous circulation to LLE via bypass, anticoag as adjunct  Allergies  Allergen Reactions  . Avelox [Moxifloxacin Hcl In Nacl] Nausea And Vomiting  . Betadine [Povidone Iodine] Itching and Rash  . Alendronate Sodium Nausea And Vomiting and Other (See Comments)    dizziness  . Aspirin Nausea Only  . Codeine Nausea And Vomiting  . Doxycycline Nausea And Vomiting  . Fluconazole Nausea And Vomiting  . Hydrocodone Nausea And Vomiting    GI distress  . Hydrocodone-Acetaminophen Nausea And Vomiting  . Morphine And Related Nausea Only  . Neurontin [Gabapentin] Other (See Comments)    Mood changes   . Nsaids Other (See Comments)    Severe gastritis & perforation - avoid NSAIDs when possible  . Quinolones Hives and Itching  . Sertraline Hcl Nausea And Vomiting and Other (See Comments)    Hallucinations   . Sulfamethoxazole Hives and Itching  . Latex Rash  . Sulfa Antibiotics Rash    Patient Measurements: Height: 5\' 5"  (165.1 cm) Weight: 85 lb 15.7 oz (39 kg) IBW/kg (Calculated) : 57 Heparin Dosing Weight:   Vital Signs: Temp: 98.9 F (37.2 C) (09/21 0341) Temp Source: Oral (09/21 0341) BP: 125/68 mmHg (09/21 0341) Pulse Rate: 96 (09/21 0341)  Labs:  Recent Labs  01/10/15 0516 01/11/15 0530 01/12/15 0603  HGB 8.7* 8.9* 8.8*  HCT 25.8* 26.8* 27.0*  PLT 328 367 335  LABPROT 16.8* 18.4* 22.5*  INR 1.36 1.52* 1.99*  CREATININE 0.75 0.78  --     Estimated Creatinine Clearance: 42 mL/min (by C-G formula based on Cr of 0.78).   Medications:  Scheduled:  . cefTAZidime (FORTAZ)  IV  2 g Intravenous Q12H  . clidinium-chlordiazePOXIDE  1 capsule Oral TID AC & HS  . dicyclomine  20 mg Oral TID AC & HS  . enoxaparin (LOVENOX) injection  40 mg Subcutaneous Q12H  . feeding supplement  1 Container Oral TID BM  . fentaNYL  12.5 mcg Transdermal Q72H  . ketotifen  1 drop  Both Eyes BID  . megestrol  800 mg Oral Daily  . multivitamin with minerals  1 tablet Oral Daily  . pantoprazole  40 mg Oral Daily  . pregabalin  50 mg Oral Daily  . vancomycin  750 mg Intravenous Q12H  . Vitamin D (Ergocalciferol)  50,000 Units Oral Q7 days  . Warfarin - Pharmacist Dosing Inpatient   Does not apply q1800    Assessment: 69yo female with tenuous blood flow to LLE via axilloprofunda bypass.  She is on D#5 of full dose Lovenox bridge with Coumadin.  INR up to 1.99 this AM, continued upward trend despite missed dose on 9/16 (ordered, not given/charted).  Hg is stable and pltc wnl.  No bleeding problems noted. Pt refused Lovenox yesterday.   Goal of Therapy:  INR 2-3 Anti-Xa level 0.6-1 units/ml 4hrs after LMWH dose given Monitor platelets by anticoagulation protocol: Yes   Plan:  Continue Lovenox 40mg  SQ q12 (full dose). D/c Lovenox tomorrow if INR > 2  Reduce Coumadin to 4mg  po x 1  Daily INR Watch for s/s of bleeding Will have a conversation with patient today and educate her on current anticoagulation regimen   Albertina Parr, PharmD., BCPS Clinical Pharmacist Phone [phone]

## 2015-01-12 NOTE — Progress Notes (Signed)
[facility] NOTE  Pharmacy Consult for TPN Indication: poor NPO intake and severe protein malnutrition s/p multiple GI surgeries in the past  Allergies  Allergen Reactions  . Avelox [Moxifloxacin Hcl In Nacl] Nausea And Vomiting  . Betadine [Povidone Iodine] Itching and Rash  . Alendronate Sodium Nausea And Vomiting and Other (See Comments)    dizziness  . Aspirin Nausea Only  . Codeine Nausea And Vomiting  . Doxycycline Nausea And Vomiting  . Fluconazole Nausea And Vomiting  . Hydrocodone Nausea And Vomiting    GI distress  . Hydrocodone-Acetaminophen Nausea And Vomiting  . Morphine And Related Nausea Only  . Neurontin [Gabapentin] Other (See Comments)    Mood changes   . Nsaids Other (See Comments)    Severe gastritis & perforation - avoid NSAIDs when possible  . Quinolones Hives and Itching  . Sertraline Hcl Nausea And Vomiting and Other (See Comments)    Hallucinations   . Sulfamethoxazole Hives and Itching  . Latex Rash  . Sulfa Antibiotics Rash    Patient Measurements: Height: 5\' 5"  (165.1 cm) Weight: 85 lb 15.7 oz (39 kg) IBW/kg (Calculated) : 57   Vital Signs: Temp: 98.9 F (37.2 C) (09/21 0341) Temp Source: Oral (09/21 0341) BP: 125/68 mmHg (09/21 0341) Pulse Rate: 96 (09/21 0341) Intake/Output from previous day: 09/20 0701 - 09/21 0700 In: 71 [P.O.:360; I.V.:100] Out: 2170 [Urine:2100; Drains:50; Blood:20] Intake/Output from this shift:    Labs:  Recent Labs  01/10/15 0516 01/11/15 0530 01/12/15 0603  WBC 12.1* 14.7* 12.2*  HGB 8.7* 8.9* 8.8*  HCT 25.8* 26.8* 27.0*  PLT 328 367 335  INR 1.36 1.52* 1.99*     Recent Labs  01/10/15 0516 01/11/15 0530  NA 137 134*  K 5.0 4.7  CL 104 102  CO2 25 26  GLUCOSE 86 90  BUN 30* 31*  CREATININE 0.75 0.78  CALCIUM 9.4 8.9  MG 1.9  --   PHOS 4.7*  --   PROT 6.6  --   ALBUMIN 2.9*  --   AST 21  --   ALT 17  --   ALKPHOS 58  --   BILITOT 0.3  --   PREALBUMIN 31.8  --    TRIG 66  --    Estimated Creatinine Clearance: 42 mL/min (by C-G formula based on Cr of 0.78).    Recent Labs  01/10/15 0020 01/10/15 0432 01/10/15 1516  GLUCAP 137* 131* 132*   Insulin Requirements in the past 24 hours:  None, SSI/CBGs dc'd 9/19  Nutritional Goals:  1200-1400 kCal, 65-75 grams of protein per day per RD recommendations on 9/8  Current Nutrition:  -Clinimix E 5/15 1000 ml over 12 hours + IVFE 20% 240 ml over 12 hours - provides 50 g protein and 1190 kcal which meets 77% of protein and 99% of kcal needs -[employer] 1 container TID between meals -Regular diet- ate ~100 % of 1 meal yesterday, was NPO for VAC change, taking breeze supplements   Assessment: 27 YOF admitted 9/7 with bilateral groin pain- she had stents placed bilaterally in May and was having drainage from the right groin site after a fluid collection popped 2 days PTA.    Surgeries/Procedures: excision of infected fem-fem BPG, L ax-PFA bypass on 9/9. Multiple abdominal surgeries noted in 2014 as per PMH- ex lap repair of gastric perforation 07/02/2012, omental patch of perforation and gastric leak repair 07/07/2012, repeat ex laps for leak repair in March 2014, small bowel resection,  placement of jejunostomy and lysis of adhesions 11/11/2012. During this admission, she had excision of fem-fem graft done 12/31/2014  GI:  Noted RD assessment from 9/8 states patient is malnourished. Has been receiving Megace without an increase in diet, though she has remained on a regular diet with decent intake. Per nutritional assessment, she eats what she can at home. Prealbumin on 9/13 of 14.5 indicates poor nutritional status.  On 9/19 prealbumin up to 31.8. Albumin up to 2.9 She is currently on Bentyl and Librax along with PO PPI. Last BM charted 9/18. Per Dr. Lianne Moris note would like to continue TPN for 1 month to help with incision healing. She may need a follow through study to determine why she has malabsorption  issues. VAC changed 9/20.   Endo: no history of DM. no issues with glycemic control with change to cyclic TPN.  SSI/CBGs stopped on 9/19  Lytes:no lytes today, yesterday: K 4.7,  1.9 CoCa ~9.78. Ca x Phos product <55. She has had no evidence of refeeding.   Renal: SCr stable  MIVF at [facility]: 100/RA  Cards:  No meds.  Hepatobil: LFTs wnl . Albumin 2.9, baseline trigs 59, 66 on 9/19.  Neuro: no acute issues  ID:   Vancomycin and ceftazidime for infected left groin with abscess. Vac in place. WBC 12.2, afebrile. Plans to continue antibiotics x1 month per ID recommendations.   Vanc 9/8>> Ceftaz 9/8>>  9/13 VT = 6 on 1g q24h>changed to 750mg  q12h 9/16 VT 15 on 750 q12h  9/7 groin wound: multiple organisms, none predominant 9/8 L&R groin wound (on zinacef): neg 9/8 blood x2: neg 9/9 urine: neg 9/9 L groin abscess (on cefazolin): neg 9/9 L groin wound: neg  AC/Heme: stared on full dose Lovenox + warfarin for tenuous perfusion of left leg via bypass- using as a patency adjunct. INR 1.99, Lovenox 40mg  q12h  Best Practices: Lovenox + warfarin  TPN Access: IR placed PICC 9/12 TPN start date: 9/12>>   Plan:  Continue 12 hour cyclic TPN in anticipation of long-term TPN provisions.   continue Clinimix E 5/15 at 1000 ml over 12 hours + 20% IVFE 240 ml over 12 hours.  This provides 1190 kcal and 50 g protein to meet ~ 77 % of protein and ~ 99 % kcal needs - PO MVI TPN labs on Thursday Eudelia Bunch, Pharm.D. [phone] 01/12/2015 7:38 AM

## 2015-01-12 NOTE — Progress Notes (Signed)
PT Cancellation Note  Patient Details Name: Cynthia Bowen MRN: [id number] DOB: 03-01-1946   Cancelled Treatment:    Reason Eval/Treat Not Completed: Per chart review pt is to d/c to LTAC today. If d/c is delayed will continue to follow acutely.    Rolinda Roan 01/12/2015, 1:49 PM   Rolinda Roan, PT, DPT Acute Rehabilitation Services Pager: [phone]

## 2015-01-13 NOTE — Telephone Encounter (Signed)
Tried to call the patient at the number that was listed in previous message but was unable to get through, no voicemail.

## 2015-01-19 ENCOUNTER — Encounter: Payer: Self-pay | Admitting: Vascular Surgery

## 2015-01-21 ENCOUNTER — Ambulatory Visit (INDEPENDENT_AMBULATORY_CARE_PROVIDER_SITE_OTHER): Payer: Self-pay | Admitting: Vascular Surgery

## 2015-01-21 ENCOUNTER — Encounter: Payer: Self-pay | Admitting: Vascular Surgery

## 2015-01-21 ENCOUNTER — Telehealth: Payer: Self-pay | Admitting: Family Medicine

## 2015-01-21 VITALS — BP 108/70 | HR 108 | Temp 100.0°F | Ht 65.0 in | Wt 85.0 lb

## 2015-01-21 DIAGNOSIS — I70219 Atherosclerosis of native arteries of extremities with intermittent claudication, unspecified extremity: Secondary | ICD-10-CM

## 2015-01-21 NOTE — Telephone Encounter (Signed)
lmom of patient new appt date with Laney Pastor with is Friday 02/18/15 at 11:15

## 2015-01-21 NOTE — Progress Notes (Signed)
    Postoperative Visit   History of Present Illness  Cynthia Bowen is a 69 y.o. year old female who presents for postoperative follow-up for: 1.  Excision of infected fem-fem BPG, L Ax-PFA bypass (Date: 12/31/14). 2.  Left axillary incision exploration and drainage of seroma (01/11/15).  The patient's wounds are healing.  The patient complains of numbness in left leg.  The patient is unwilling to complete her activities of daily living.  She is still in a SNF for continued IV Abx and TPN  For VQI Use Only  PRE-ADM LIVING: Nursing home  AMB STATUS: Ambulatory with Assistance  Physical Examination  Filed Vitals:   01/21/15 0829  BP: 108/70  Pulse: 108  Temp: 100 F (37.8 C)   R groin: near healed L groin: greatly contract, +granulation, no drainage; some fluid laterally over graft, palpable graft L chest: healed L ax exposure: near healed L ax-PFA route: some fluid pockets around graft L foot: dopplerable DP  Medical Decision Making  Cynthia Bowen is a 70 y.o. year old female who presents s/p exc infected fem-fem BPG, L ax-PFA bypass, Ax seroma drainage, chronic malnutrition, depression sx . The patient's bypass incisions are healing appropriately. Cont wet-to-dry dressing to open incisions BID Will watch fluid collections around graft.  Likely seromas Follow up with Dr. Algis Downs office for IV abx termination date and transition to PO rx Continue TPN until IV abx terminated Once IV abx and TPN stopped, ok to transition patient back home with wound care. Follow up next week. Patient should be evaluated by Psychiatry for depression.  Thank you for allowing [place] to participate in this patient's care.  Adele Barthel, MD [facility] Office: [phone] Pager: [phone]  01/21/2015, 10:14 AM

## 2015-01-26 ENCOUNTER — Encounter: Payer: Self-pay | Admitting: Vascular Surgery

## 2015-01-28 ENCOUNTER — Encounter: Payer: Self-pay | Admitting: Vascular Surgery

## 2015-01-28 ENCOUNTER — Ambulatory Visit (INDEPENDENT_AMBULATORY_CARE_PROVIDER_SITE_OTHER): Payer: Medicare Other | Admitting: Vascular Surgery

## 2015-01-28 VITALS — BP 113/60 | HR 101 | Temp 98.8°F | Resp 16 | Ht 64.0 in | Wt 91.2 lb

## 2015-01-28 DIAGNOSIS — I70212 Atherosclerosis of native arteries of extremities with intermittent claudication, left leg: Secondary | ICD-10-CM

## 2015-01-28 NOTE — Progress Notes (Signed)
    Postoperative Visit   History of Present Illness  Cynthia Bowen is a 69 y.o. (1946/01/27) female  who presents for postoperative follow-up for: 1. Excision of infected fem-fem BPG, L Ax-PFA bypass (Date: 12/31/14). 2. Left axillary incision exploration and drainage of seroma (01/11/15).  The patient's wounds are nearly healed.  Fluid around L ax-PFA bypass is decreasing.  The patient complains of numbness in left leg. Mobility is improving as is her PO intake.  TPN has been discontinued.  She is nearly the end of her month of abx.  For VQI Use Only  PRE-ADM LIVING: Nursing home  AMB STATUS: Ambulatory with Assistance   Physical Examination  Filed Vitals:   01/28/15 0842  BP: 113/60  Pulse: 101  Temp: 98.8 F (37.1 C)  Resp: 16  Height: 5\' 4"  (1.626 m)  Weight: 91 lb 3.2 oz (41.368 kg)  SpO2: 100%    R groin: healed L groin: nearly healed, granulation at level of skin, fluid lateral greatly decreased, palpable graft L chest: healed, some ballotable fluid, decreased from previously L ax exposure: healed, decreased fluid around graft L ax-PFA route: some fluid pockets around graft, all decreased in volume L foot: warm, viable foot with intact motor and sensation  Medical Decision Making  Cynthia Bowen is a 69 y.o. ([birth year]) female who presents s/p exc infected fem-fem BPG, L ax-PFA bypass, Ax seroma drainage, chronic malnutrition, depression sx .  The patient's bypass incisions are nearly completely healed.  Will watch fluid collections around graft, likely seromas.  Volume already decreasing spontaneously.  I doubt any advantage to actively trying to drain the collections.  IV abx finish on 01/30/15.  Continued on Keflex 500 mg PO tid x 1 month.  Follow up with Dr. Johnnye Sima on 02/16/15.  Will defer to him the question of possible chronic suppression, given continued graft presence in R groin (patch angioplasty) and covered stent in aorta.  Follow up in 4  weeks for wound check.  Again, I feel patient should be evaluated by Psychiatry for depression.  Thank you for allowing [place] to participate in this patient's care.  Adele Barthel, MD [facility] Office: [phone] Pager: [phone]  01/28/2015, 9:35 AM

## 2015-02-03 ENCOUNTER — Ambulatory Visit (INDEPENDENT_AMBULATORY_CARE_PROVIDER_SITE_OTHER): Payer: Medicare Other | Admitting: Family Medicine

## 2015-02-03 ENCOUNTER — Ambulatory Visit (INDEPENDENT_AMBULATORY_CARE_PROVIDER_SITE_OTHER): Payer: Medicare Other

## 2015-02-03 VITALS — BP 116/60 | HR 110 | Temp 98.3°F | Resp 16 | Ht 64.0 in | Wt 93.0 lb

## 2015-02-03 DIAGNOSIS — M25571 Pain in right ankle and joints of right foot: Secondary | ICD-10-CM

## 2015-02-03 DIAGNOSIS — S99911A Unspecified injury of right ankle, initial encounter: Secondary | ICD-10-CM

## 2015-02-03 DIAGNOSIS — S82891A Other fracture of right lower leg, initial encounter for closed fracture: Secondary | ICD-10-CM | POA: Insufficient documentation

## 2015-02-03 DIAGNOSIS — A047 Enterocolitis due to Clostridium difficile: Secondary | ICD-10-CM | POA: Diagnosis not present

## 2015-02-03 DIAGNOSIS — M25474 Effusion, right foot: Secondary | ICD-10-CM | POA: Diagnosis not present

## 2015-02-03 DIAGNOSIS — L02214 Cutaneous abscess of groin: Secondary | ICD-10-CM | POA: Diagnosis present

## 2015-02-03 DIAGNOSIS — I70209 Unspecified atherosclerosis of native arteries of extremities, unspecified extremity: Secondary | ICD-10-CM

## 2015-02-03 DIAGNOSIS — S82401A Unspecified fracture of shaft of right fibula, initial encounter for closed fracture: Secondary | ICD-10-CM | POA: Diagnosis not present

## 2015-02-03 NOTE — Progress Notes (Signed)
Chief Complaint:  Chief Complaint  Patient presents with  . Ankle Injury    fell down stairs on 10/11, injuried right ankle    HPI: Cynthia Bowen is a 69 y.o. female who reports to [facility] today complaining of misstepped 2 days ago on steps at [facility] , after hsopitalization for recent surgery, she stepped off 1 step , has pain and swelling, more pain than yesterday, has tried ice and elevation. She has no pain in other places. The nurse was  With her. She ahs OA and also osteoporois. She ahs no pain in other places. She is under pain management and is already on pain meds. Pain is severe.   Past Medical History  Diagnosis Date  . COPD (chronic obstructive pulmonary disease) ([place])   . Pneumonia 12-2011  . GERD (gastroesophageal reflux disease)   . Headache(784.0)   . Arthritis     osteoarthritis  . Allergy   . Depression   . Neuromuscular disorder ([place])   . Osteoporosis   . Bronchitis     CURRENTLY AS OF 06/30/12 - HAS COUGH AND FINISHED ANTIBIOTIC FOR BRONCHITIS  . Fibromyalgia   . Pain     ABDOMINAL PAIN AND NAUSEA  . Pain     SOMETIMES PAIN RIGHT EAR AND NECK--STATES CAUSED BY A "LUMP" ON BACK OF EAR--USES KENALOG CREAM TOPICALLY AS NEEDED.  Marland Kitchen Gastrocutaneous fistula   . Anemia   . Anxiety   . Blood transfusion without reported diagnosis   . Heart murmur     young  . Peripheral vascular disease (H[facility])     hx  ?leg  . History of kidney stones    Past Surgical History  Procedure Laterality Date  . Abdominal hysterectomy    . Esophagogastroduodenoscopy  04/18/2012    Procedure: ESOPHAGOGASTRODUODENOSCOPY (EGD);  Surgeon: Inda Castle, MD;  Location: [facility];  Service: Endoscopy;  Laterality: N/A;  . Eus  05/29/2012    Procedure: UPPER ENDOSCOPIC ULTRASOUND (EUS) LINEAR;  Surgeon: Milus Banister, MD;  Location: [facility];  Service: Endoscopy;  Laterality: N/A;  . Appendectomy    . Spine surgery      CERVICAL SPINE SURGERY X 2 - INCLUDING FUSION;  LUMBAR SURGERY FOR RUPTURED DISC  . Eye surgery      BILATERAL CATARACT EXTRACTIONS  . Laparoscopic abdominal exploration N/A 07/02/2012    Procedure: converted to laparotomy with gastric biopsy;  Surgeon: Imogene Burn. Georgette Dover, MD;  Location: [facility];  Service: General;  Laterality: N/A;  Laparoscopic Gastric Biospy attempted.   . Laparotomy N/A 07/07/2012    Procedure: EXPLORATORY LAPAROTOMY repair of gastric perforation with omental graham patch, drainage of abdominal abcess;  Surgeon: Imogene Burn. Georgette Dover, MD;  Location: [facility];  Service: General;  Laterality: N/A;  . Stomach surgery  07/07/2012    Omental patch of gastric perforation  . Laparotomy N/A 07/13/2012    Procedure: EXPLORATORY LAPAROTOMY repair gastric leak;  Surgeon: Edward Jolly, MD;  Location: [facility];  Service: General;  Laterality: N/A;  . Laparotomy N/A 11/11/2012    Procedure: EXPLORATORY LAPAROTOMY;  Surgeon: Imogene Burn. Georgette Dover, MD;  Location: [facility];  Service: General;  Laterality: N/A;  . Bowel resection N/A 11/11/2012    Procedure: SMALL BOWEL RESECTION;  Surgeon: Imogene Burn. Georgette Dover, MD;  Location: [facility];  Service: General;  Laterality: N/A;  . Minor application of wound vac N/A 11/11/2012    Procedure: APPLICATION OF WOUND VAC;  Surgeon: Imogene Burn.  Georgette Dover, MD;  Location: [facility];  Service: General;  Laterality: N/A;  . Jejunostomy N/A 11/11/2012    Procedure: PLACEMENT OF FEEDING JEJUNOSTOMY TUBE;  Surgeon: Imogene Burn. Georgette Dover, MD;  Location: [facility];  Service: General;  Laterality: N/A;  . Lysis of adhesion N/A 11/11/2012    Procedure: LYSIS OF ADHESION;  Surgeon: Imogene Burn. Georgette Dover, MD;  Location: [facility];  Service: General;  Laterality: N/A;  . Hernia repair    . Gastric fistula repair      05/16/2013  . Colonoscopy    . Polypectomy    . Upper gastrointestinal endoscopy    . Abdominal aortagram N/A 08/19/2014    Procedure: ABDOMINAL Maxcine Ham;  Surgeon: Conrad Golden, MD;  [facility] C[facility];  Service: Cardiovascular;  Laterality: N/A;    . Abdominal aortic endovascular stent graft N/A 08/26/2014    Procedure: AORTIC ENDOVASCULAR  COVERED STENT ;  Surgeon: Conrad Glidden, MD;  Location: [facility];  Service: Vascular;  Laterality: N/A;  IVUS  . Femoral-femoral bypass graft Bilateral 09/04/2014    Procedure: Right External Iliac and right comon femoral endarterectomy with patching and right to left BYPASS GRAFT FEMORAL-FEMORAL ARTERY, ;  Surgeon: Rosetta Posner, MD;  Location: [facility];  Service: Vascular;  Laterality: Bilateral;  . Wound exploration Left 12/30/2014    Procedure: WOUND EXPLORATION;  Surgeon: Conrad Norcross, MD;  Location: [facility];  Service: Vascular;  Laterality: Left;  . Irrigation and debridement abscess Bilateral 12/30/2014    Procedure: IRRIGATION AND DEBRIDEMENT of bilateral groin ABSCESSes;  Surgeon: Conrad [place], MD;  Location: [facility];  Service: Vascular;  Laterality: Bilateral;  . Application of wound vac Right 12/30/2014    Procedure: APPLICATION OF WOUND VAC to right groin;  Surgeon: Conrad [place], MD;  Location: [facility];  Service: Vascular;  Laterality: Right;  . Femoral-femoral bypass graft Bilateral 12/31/2014    Procedure: REMOVAL INFECTED RIGHT FEMORAL ARTERY TO LEFT FEMORAL ARTERY BYPASS GRAFT;  Surgeon: Conrad [place], MD;  Location: [facility];  Service: Vascular;  Laterality: Bilateral;  . Axillary-femoral bypass graft Left 12/31/2014    Procedure: LEFT  AXILLA RY ARTERY TO LEFT FEMORAL ARTERY BYPASS GRAFT;  Surgeon: Conrad [place], MD;  Location: [facility];  Service: Vascular;  Laterality: Left;  . Patch angioplasty Right 12/31/2014    Procedure: RIGHT FEMORAL ARTERY PATCH ANGIOPLASTY USING Rueben Bash BIOLOGIC PATCH;  Surgeon: Conrad [place], MD;  Location: [facility];  Service: Vascular;  Laterality: Right;  . Intraoperative arteriogram Left 12/31/2014    Procedure: INTRA OPERATIVE ARTERIOGRAM;  Surgeon: Conrad [place], MD;  Location: [facility];  Service: Vascular;  Laterality: Left;  . Wound exploration Left 01/11/2015    Procedure: LEFT AXILLARY WOUND  EXPLORATION AND DRAINAGE OF SEROMA;  Surgeon: Conrad [place], MD;  Location: [facility];  Service: Vascular;  Laterality: Left;  . Application of wound vac Left 01/11/2015    Procedure: APPLICATION OF NEGATIVE PRESSURE WOUND DRESSING TO LEFT AXILLA  AND LEFT GROIN;  Surgeon: Conrad , MD;  Location: [facility];  Service: Vascular;  Laterality: Left;   Social History   Social History  . Marital Status: Single    Spouse Name: N/A  . Number of Children: 0  . Years of Education: N/A   Occupational History  .     Social History Main Topics  . Smoking status: Former Smoker -- 0.50 packs/day for 35 years    Types: Cigarettes    Start  date: 01/04/2003    Quit date: 04/17/2005  . Smokeless tobacco: Never Used  . Alcohol Use: No  . Drug Use: No  . Sexual Activity: No   Other Topics Concern  . None   Social History Narrative   Single. Education: [employer]. Exercise.   Family History  Problem Relation Age of Onset  . COPD Mother   . Hyperlipidemia Mother   . Hypertension Maternal Grandmother   . Stroke Maternal Grandmother   . Colon cancer Neg Hx    Allergies  Allergen Reactions  . Avelox [Moxifloxacin Hcl In Nacl] Nausea And Vomiting  . Betadine [Povidone Iodine] Itching and Rash  . Alendronate Sodium Nausea And Vomiting and Other (See Comments)    dizziness  . Aspirin Nausea Only  . Codeine Nausea And Vomiting  . Doxycycline Nausea And Vomiting  . Fluconazole Nausea And Vomiting  . Hydrocodone Nausea And Vomiting    GI distress  . Hydrocodone-Acetaminophen Nausea And Vomiting  . Morphine And Related Nausea Only  . Neurontin [Gabapentin] Other (See Comments)    Mood changes   . Nsaids Other (See Comments)    Severe gastritis & perforation - avoid NSAIDs when possible  . Quinolones Hives and Itching  . Sertraline Hcl Nausea And Vomiting and Other (See Comments)    Hallucinations   . Sulfamethoxazole Hives and Itching  . Latex Rash  . Sulfa Antibiotics Rash   Prior to  Admission medications   Medication Sig Start Date End Date Taking? Authorizing Provider  amoxicillin-clavulanate (AUGMENTIN) 875-125 MG tablet  11/26/14  Yes Historical Provider, MD  cefTAZidime 2 g in dextrose 5 % 50 mL Inject 2 g into the vein every 12 (twelve) hours. 01/12/15  Yes Samantha J Rhyne, PA-C  cephALEXin (KEFLEX) 500 MG capsule  11/15/14  Yes Historical Provider, MD  clidinium-chlordiazePOXIDE (LIBRAX) 5-2.5 MG per capsule Take 1 capsule by mouth 4 (four) times daily -  before meals and at bedtime.  09/15/14  Yes Historical Provider, MD  dicyclomine (BENTYL) 20 MG tablet TAKE 1 TABLET (20 MG TOTAL) BY MOUTH 4 (FOUR) TIMES DAILY - BEFORE MEALS AND AT BEDTIME. 12/28/14  Yes [name] Koyanagi, MD  feeding supplement, RESOURCE BREEZE, (RESOURCE BREEZE) LIQD Take 1 Container by mouth 3 (three) times daily between meals. 06/05/14  Yes Modena Jansky, MD  fentaNYL (DURAGESIC - DOSED MCG/HR) 12 MCG/HR Place 1 patch (12.5 mcg total) onto the skin every 3 (three) days. 01/12/15  Yes Samantha J Rhyne, PA-C  fluocinonide cream (LIDEX) 5.63 % Apply 1 application topically 2 (two) times daily as needed.  11/07/14  Yes Historical Provider, MD  ketotifen (ZADITOR) 0.025 % ophthalmic solution Place 1 drop into both eyes 2 (two) times daily.   Yes Historical Provider, MD  LYRICA 50 MG capsule Take 50 mg by mouth daily.  10/15/14  Yes Historical Provider, MD  Multiple Vitamin (MULTIVITAMIN WITH MINERALS) TABS tablet Take 1 tablet by mouth daily. 01/12/15  Yes Samantha J Rhyne, PA-C  ondansetron (ZOFRAN) 4 MG tablet Take 4 mg by mouth every 8 (eight) hours as needed for nausea or vomiting.  11/23/14  Yes Historical Provider, MD  oxyCODONE-acetaminophen (PERCOCET/ROXICET) 5-325 MG per tablet Take 1-2 tablets by mouth every 6 (six) hours as needed for moderate pain. 01/12/15  Yes Samantha J Rhyne, PA-C  polyethylene glycol powder (GLYCOLAX/MIRALAX) powder Take 17 g by mouth 2 (two) times daily as needed. 12/28/14  Yes  [name] Koyanagi, MD  PROAIR HFA 108 [phone] BASE) MCG/ACT  inhaler  09/13/14  Yes Historical Provider, MD  ranitidine (ZANTAC) 150 MG tablet TAKE 1 TABLET TWICE DAILY 11/08/14  Yes [name] Koyanagi, MD  Simethicone (GAS-X EXTRA STRENGTH PO) Take 1 tablet by mouth 3 (three) times daily after meals.    Yes Historical Provider, MD  Vancomycin (VANCOCIN) 750 MG/150ML SOLN Inject 150 mLs (750 mg total) into the vein every 12 (twelve) hours. 01/12/15  Yes Samantha J Rhyne, PA-C  Vitamin D, Ergocalciferol, (DRISDOL) 50000 UNITS CAPS capsule Take 1 capsule (50,000 Units total) by mouth every 7 (seven) days. Patient taking differently: Take 50,000 Units by mouth every 7 (seven) days. On Saturday 06/29/14  Yes Robyn Haber, MD  megestrol (MEGACE) 40 MG/ML suspension Take 20 mLs (800 mg total) by mouth daily. Patient not taking: Reported on 02/03/2015 01/12/15   Hulen Shouts Rhyne, PA-C  warfarin (COUMADIN) 4 MG tablet Take 1 tablet (4 mg total) by mouth daily at 6 PM. Patient not taking: Reported on 02/03/2015 01/12/15   Samantha J Rhyne, PA-C     ROS: The patient denies fevers, chills, night sweats, unintentional weight loss, chest pain, palpitations, wheezing, dyspnea on exertion, nausea, vomiting, abdominal pain, dysuria, hematuria, melena  All other systems have been reviewed and were otherwise negative with the exception of those mentioned in the HPI and as above.    PHYSICAL EXAM: Filed Vitals:   02/03/15 1250  BP: 116/60  Pulse: 110  Temp: 98.3 F (36.8 C)  Resp: 16   Body mass index is 15.96 kg/(m^2).   General: Alert, no acute distress HEENT:  Normocephalic, atraumatic, oropharynx patent. EOMI, PERRLA Cardiovascular:  Regular rate and rhythm, no rubs murmurs or gallops.  No Carotid bruits, radial pulse intact. No pedal edema.  Respiratory: Clear to auscultation bilaterally.  No wheezes, rales, or rhonchi.  No cyanosis, no use of accessory musculature Abdominal: No organomegaly, abdomen is  soft and non-tender, positive bowel sounds. No masses. Skin: No rashes. Neurologic: Facial musculature symmetric. Psychiatric: Patient acts appropriately throughout our interaction. Lymphatic: No cervical or submandibular lymphadenopathy Musculoskeletal: Gait antalgic with cane .  + right ankle edema, tenderness, + echymsis, + DP Neg knee pain   LABS:    EKG/XRAY:   Primary read interpreted by Dr. Marin Comment at [facility]. Distal fibula fracture Normal foot   ASSESSMENT/PLAN: Encounter Diagnoses  Name Primary?  . Right ankle injury, initial encounter   . Pain in joint, ankle and foot, right   . Swelling of foot joint, right   . Closed fibular fracture, right, initial encounter Yes   Needs ortho referal Splint and crutches given, nonweightbearing Fu prn  She decline any pain meds since already on it. IF needs anymore pain meds need to go to pain managment  Gross sideeffects, risk and benefits, and alternatives of medications d/w patient. Patient is aware that all medications have potential sideeffects and we are unable to predict every sideeffect or drug-drug interaction that may occur.  Rennae Ferraiolo DO  02/05/2015 7:47 AM

## 2015-02-04 ENCOUNTER — Telehealth: Payer: Self-pay

## 2015-02-04 MED ORDER — CYCLOBENZAPRINE HCL 5 MG PO TABS: ORAL_TABLET | ORAL | Status: DC

## 2015-02-04 NOTE — Telephone Encounter (Signed)
On pain meds, will not rx pain med since has pain management, will give low dose flexeril with precautions. Advise patient about risks. Sh eshould stay off her feet. ORtho referral still pending

## 2015-02-04 NOTE — Telephone Encounter (Signed)
[facility] a verbal order skilled nursing, disease management, PT & Occ Therapy [phone]

## 2015-02-04 NOTE — Telephone Encounter (Signed)
Patient is requesting pain medication - [facility] on [address].   [phone]

## 2015-02-08 ENCOUNTER — Encounter: Payer: Self-pay | Admitting: Physician Assistant

## 2015-02-08 ENCOUNTER — Inpatient Hospital Stay: Payer: Medicare Other | Admitting: Family Medicine

## 2015-02-08 ENCOUNTER — Telehealth: Payer: Self-pay | Admitting: *Deleted

## 2015-02-08 ENCOUNTER — Ambulatory Visit (INDEPENDENT_AMBULATORY_CARE_PROVIDER_SITE_OTHER): Payer: Medicare Other | Admitting: Physician Assistant

## 2015-02-08 VITALS — BP 128/68 | HR 82 | Temp 98.3°F | Resp 16 | Wt 94.4 lb

## 2015-02-08 DIAGNOSIS — E43 Unspecified severe protein-calorie malnutrition: Secondary | ICD-10-CM

## 2015-02-08 DIAGNOSIS — R899 Unspecified abnormal finding in specimens from other organs, systems and tissues: Secondary | ICD-10-CM

## 2015-02-08 DIAGNOSIS — I70209 Unspecified atherosclerosis of native arteries of extremities, unspecified extremity: Secondary | ICD-10-CM

## 2015-02-08 DIAGNOSIS — I70212 Atherosclerosis of native arteries of extremities with intermittent claudication, left leg: Secondary | ICD-10-CM

## 2015-02-08 DIAGNOSIS — D649 Anemia, unspecified: Secondary | ICD-10-CM

## 2015-02-08 DIAGNOSIS — R739 Hyperglycemia, unspecified: Secondary | ICD-10-CM | POA: Diagnosis not present

## 2015-02-08 DIAGNOSIS — Z09 Encounter for follow-up examination after completed treatment for conditions other than malignant neoplasm: Secondary | ICD-10-CM

## 2015-02-08 DIAGNOSIS — F418 Other specified anxiety disorders: Secondary | ICD-10-CM | POA: Diagnosis not present

## 2015-02-08 DIAGNOSIS — E46 Unspecified protein-calorie malnutrition: Secondary | ICD-10-CM

## 2015-02-08 LAB — CBC WITH DIFFERENTIAL/PLATELET
BASOS ABS: 0 10*3/uL (ref 0.0–0.1)
Basophils Relative: 0 % (ref 0–1)
Eosinophils Absolute: 0.1 10*3/uL (ref 0.0–0.7)
Eosinophils Relative: 1 % (ref 0–5)
HEMATOCRIT: 30.4 % — AB (ref 36.0–46.0)
HEMOGLOBIN: 9.5 g/dL — AB (ref 12.0–15.0)
LYMPHS ABS: 1.5 10*3/uL (ref 0.7–4.0)
LYMPHS PCT: 18 % (ref 12–46)
MCH: 25.9 pg — ABNORMAL LOW (ref 26.0–34.0)
MCHC: 31.3 g/dL (ref 30.0–36.0)
MCV: 82.8 fL (ref 78.0–100.0)
MPV: 9.9 fL (ref 8.6–12.4)
Monocytes Absolute: 0.6 10*3/uL (ref 0.1–1.0)
Monocytes Relative: 7 % (ref 3–12)
NEUTROS ABS: 6.4 10*3/uL (ref 1.7–7.7)
Neutrophils Relative %: 74 % (ref 43–77)
Platelets: 485 10*3/uL — ABNORMAL HIGH (ref 150–400)
RBC: 3.67 MIL/uL — AB (ref 3.87–5.11)
RDW: 16.2 % — ABNORMAL HIGH (ref 11.5–15.5)
WBC: 8.6 10*3/uL (ref 4.0–10.5)

## 2015-02-08 MED ORDER — ENOXAPARIN SODIUM 80 MG/0.8ML ~~LOC~~ SOLN: 80.0000 mg | SUBCUTANEOUS | Status: DC

## 2015-02-08 NOTE — Progress Notes (Signed)
Cynthia Bowen  MRN: [id number] DOB: April 27, 1945  Subjective:  Pt presents to clinic for hospital f/u.  She was released from [facility] on 10/11 and the same day she fell and broke her foot.  She is having a lot of foot pain and she had an appt with [facility] today at 1:45pm.  She is glad she is out of the hospital but frustrated about her care while she was there.  She has been taking her medications daily.  She states that she was not send home with coumadin and was not told she needed to continue it.    Eating solid food and boost 2x/day (dairy free) and yogurt (activa) - eating about a meal a day when she can.  She states her weight is about 91 lbs prior to release from hospital and she thinks it is slightly elevated today due to cast on her foot.  She was placed on crutches but she has been using a walker because she was really unstable and she is much more comfortable on the walker.  She has a nurse coming to her home for wound check and the nurse states the wounds seem to be healing nicely.  She is not complaining of pain at her incision sites - she states the pain in her foot is the worst.  She is at home and with her sister.    Patient Active Problem List   Diagnosis Date Noted  . Abnormal finding on mammography 12/31/2014  . Groin abscess 12/30/2014  . Groin pain 12/29/2014  . Chronic pain associated with significant psychosocial dysfunction 12/15/2014  . Wound drainage-Right groin 11/15/2014  . Ischemia of lower extremity 09/05/2014  . Abdominal aortic stenosis 09/03/2014  . PAD (peripheral artery disease) ([place]) 08/26/2014  . Discoloration of skin- Left Heel 08/13/2014  . Left foot pain- Heel 08/13/2014  . Leg heaviness-Bilateral 08/13/2014  . Protein-calorie malnutrition, severe ([place]) 06/04/2014  . Cellulitis of hand 06/02/2014  . Hypokalemia 06/02/2014  . Osteoporosis 05/22/2014  . Recurrent abdominal pain--multiple causes, status post GI surgery with multiple  complications 1749 [id number]  . Abnormal CT scan 02/08/2014  . Atherosclerosis of native arteries of extremity with intermittent claudication ([place]) 01/15/2014  . Nephrolithiasis-asymptomatic/noted on scan 10/08/2013  . Other and unspecified ovarian cyst--noted on scans 2014 2015/asymptomatic 10/08/2013  . Pancreatic lesion-6 mm noted on MRI at [facility] 10/08/2013  . Femoral artery stenosis-bilateral noted CT at [facility] 2015 10/08/2013  . History of pulmonary embolism 08/05/2012  . Incarcerated epigastric hernia s/p primary repair 07/02/2012 07/11/2012  . 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 [id number] 07/10/2012  . Pericardial effusion 04/17/2012  . COPD GOLD I 01/29/2012  . Back pain DDD S/p surgery [place] 1993 12/29/2011  . Depression with anxiety 04/02/2011  . GERD (gastroesophageal reflux disease) 04/02/2011  . Osteoarthritis 04/02/2011  . Cervical neck pain with evidence of disc disease-s/p surgery [id number] 04/02/2011  . Fibromyalgia-[place] 2004 04/02/2011    Current Outpatient Prescriptions on File Prior to Visit  Medication Sig Dispense Refill  . amoxicillin-clavulanate (AUGMENTIN) 875-125 MG tablet     . cephALEXin (KEFLEX) 500 MG capsule     . clidinium-chlordiazePOXIDE (LIBRAX) 5-2.5 MG per capsule Take 1 capsule by mouth 4 (four) times daily -  before meals and at bedtime.     . cyclobenzaprine (FLEXERIL) 5 MG tablet Take 1/2 to 1 tab po qhs prn, monitor for drowsiness 20 tablet 0  . dicyclomine (BENTYL) 20 MG tablet TAKE 1  TABLET (20 MG TOTAL) BY MOUTH 4 (FOUR) TIMES DAILY - BEFORE MEALS AND AT BEDTIME. 120 tablet 5  . feeding supplement, RESOURCE BREEZE, (RESOURCE BREEZE) LIQD Take 1 Container by mouth 3 (three) times daily between meals.    . fluocinonide cream (LIDEX) 2.72 % Apply 1 application topically 2 (two) times daily as needed.     Marland Kitchen ketotifen (ZADITOR) 0.025 % ophthalmic solution Place 1 drop into both  eyes 2 (two) times daily.    Marland Kitchen LYRICA 50 MG capsule Take 50 mg by mouth daily.     . megestrol (MEGACE) 40 MG/ML suspension Take 20 mLs (800 mg total) by mouth daily. 240 mL 0  . Multiple Vitamin (MULTIVITAMIN WITH MINERALS) TABS tablet Take 1 tablet by mouth daily.    . ondansetron (ZOFRAN) 4 MG tablet Take 4 mg by mouth every 8 (eight) hours as needed for nausea or vomiting.     Marland Kitchen oxyCODONE-acetaminophen (PERCOCET/ROXICET) 5-325 MG per tablet Take 1-2 tablets by mouth every 6 (six) hours as needed for moderate pain. 30 tablet 0  . polyethylene glycol powder (GLYCOLAX/MIRALAX) powder Take 17 g by mouth 2 (two) times daily as needed. 500 g 1  . PROAIR HFA 108 (90 BASE) MCG/ACT inhaler     . ranitidine (ZANTAC) 150 MG tablet TAKE 1 TABLET TWICE DAILY 180 tablet 0  . Simethicone (GAS-X EXTRA STRENGTH PO) Take 1 tablet by mouth 3 (three) times daily after meals.     . Vitamin D, Ergocalciferol, (DRISDOL) 50000 UNITS CAPS capsule Take 1 capsule (50,000 Units total) by mouth every 7 (seven) days. (Patient taking differently: Take 50,000 Units by mouth every 7 (seven) days. On Saturday) 12 capsule 3   No current facility-administered medications on file prior to visit.    Allergies  Allergen Reactions  . Avelox [Moxifloxacin Hcl In Nacl] Nausea And Vomiting  . Betadine [Povidone Iodine] Itching and Rash  . Alendronate Sodium Nausea And Vomiting and Other (See Comments)    dizziness  . Aspirin Nausea Only  . Codeine Nausea And Vomiting  . Doxycycline Nausea And Vomiting  . Fluconazole Nausea And Vomiting  . Hydrocodone Nausea And Vomiting    GI distress  . Hydrocodone-Acetaminophen Nausea And Vomiting  . Morphine And Related Nausea Only  . Neurontin [Gabapentin] Other (See Comments)    Mood changes   . Nsaids Other (See Comments)    Severe gastritis & perforation - avoid NSAIDs when possible  . Quinolones Hives and Itching  . Sertraline Hcl Nausea And Vomiting and Other (See Comments)     Hallucinations   . Sulfamethoxazole Hives and Itching  . Latex Rash  . Sulfa Antibiotics Rash    Review of Systems  Musculoskeletal: Positive for gait problem (2nd to broken foot).   Objective:  BP 128/68 mmHg  Pulse 82  Temp(Src) 98.3 F (36.8 C) (Oral)  Resp 16  Wt 94 lb 6.4 oz (42.82 kg)  Physical Exam  Constitutional: She is oriented to person, place, and time. She appears malnourished. She appears unhealthy.  HENT:  Head: Normocephalic and atraumatic.  Right Ear: Hearing and external ear normal.  Left Ear: Hearing and external ear normal.  Eyes: Conjunctivae and EOM are normal. Pupils are equal, round, and reactive to light. No scleral icterus.  Neck: Normal range of motion.  Cardiovascular: Normal rate, regular rhythm and normal heart sounds.   No murmur heard. Pulmonary/Chest: Effort normal.  Neurological: She is alert and oriented to person, place, and time. Gait normal.  Skin: Skin is warm and dry.  Psychiatric: Mood, memory, affect and judgment normal.  Vitals reviewed.   Assessment and Plan :  Anemia, unspecified anemia type - Plan: CBC with Differential/Platelet  Hyperglycemia - Plan: Hemoglobin A1c  Protein calorie malnutrition (HCC)  Depression with anxiety  Hospital discharge follow-up  Abnormal laboratory test  Protein-calorie malnutrition, severe (HCC)  Atherosclerosis of native artery of left lower extremity with intermittent claudication ([place]) - Plan: enoxaparin (LOVENOX) 80 MG/0.8ML injection   Checks labs.  Reviewed her hospital records and noticed that she was release from [facility] on coumadin but she is not taking any since her d/c from [place].  I called and spoke with Zigmund Daniel from Dr Lianne Moris office - need PT/INR target 2-2.5 on Coumadin 4mg  daily at time of discharge was in the discharge summary - they do not monitor PT/INR at their office - she plans to talk with Dr Bridgett Larsson whether she needs to be on Lovenox until her coumadin can be restarted - she will  contact [place] about whether she was on it then and what they f/u plan was to be regarding this upon her discharge.  Lay called back and the patient was discharged from [facility] without coumadin and she needs anti-coagulation so we will restart her Lovenox today at 80mg .  Zigmund Daniel will work on getting her into the [place] coumadin clinic because we are not set up to do that here.  She will work on increase in her nutrition and really work on getting in her 3 boosts a day as this will help her healing - She will work on more than one full meal a day and try for 2 meals a day.  We will recheck her labs as she was anemic in the hospital which likely was from blood loss in addition to normal anemia from her malnutrition.  She has had multiple elevated glucose readings so we checked a A1C today.  She is not interested in treating her depression - she just wants to get well from her medication problems.    Spent 45 mins with patient with >50% spent in reviewing her chart and coordinating her care with Dr Lianne Moris office in regards to her Coumadin use.  Windell Hummingbird PA-C  [facility] 02/08/2015 4:59 PM

## 2015-02-08 NOTE — Patient Instructions (Addendum)
Greek yogurt for protein Really try hard to get 3 nutritional shakes in - getting in enough calories will help with your healing

## 2015-02-08 NOTE — Telephone Encounter (Signed)
Received a call from [place] regarding Mrs. Null' Coumadin follow up from her hospital stay. According to the patient, per Judson Roch, she has not had any Coumadin since leaving [facility] on 02-01-15. I called [facility] and spoke to Juluis Pitch, Director of Case Management, and she looked at the patient's chart for discharge plan for Coumadin follow-up. Per Mariann Laster, pt was on 6 mg of Coumadin at discharge but she did not see any follow up scheduled. She asked if she could call me back after researching this further. Mariann Laster called back and said" she called Dr. Marcellus Scott and he said that they did not leave her on Coumadin because he put her on Flagyl and Flagyl tends to drive up the INR but she can be put back on it if needed". Mariann Laster reports that they did do bloodwork on 02-01-15 and PT 23.6 and INR 2.31 with patient being on Coumadin 6mg  daily. She regretfully said "that the case manager did not do her job and arrange any follow up for this patient". I told her that Dr. Bridgett Larsson had wanted Mrs. Vandeven to stay on Coumadin because she has multiple vascular grafts and is at risk for occlusion.   I called Windell Hummingbird, PA and told her what I had found out from [place]. She is going to call Mrs. Noxon and explain that we need her to stay on anticoagulation. She will start the Lovenox bridge today and wants [place] to refer the patient to the Coumadin Clinic for follow up (Evidently, her office can not refer to Coumadin Clinic). I will place order for referral to Coumadin Clinic and left Dr. Bridgett Larsson be aware of this incident with [facility].  I sent Dr. Bridgett Larsson a staff message to inform him.

## 2015-02-09 LAB — HEMOGLOBIN A1C
Hgb A1c MFr Bld: 5 % (ref <5.7)
MEAN PLASMA GLUCOSE: 97 mg/dL (ref <117)

## 2015-02-09 NOTE — Telephone Encounter (Signed)
Received a call from [place] at [facility], she spoke to Dr. Elijio Miles and he said that he will do Coumadin management for Mrs. Doyle Askew.   His office is Investment banker, corporate at [employer] in [place].  Christie at his office will contact patient and make sure this is monitored. I also called Windell Hummingbird, PA and gave her this information. She started patient on 10 day bridge of Lovenox to cover her until seen at [facility].

## 2015-02-09 NOTE — Telephone Encounter (Signed)
Pt called here for verification on below message. Spoke with pt and she understands that she is to stay on Lovenox until her appt on Fri at which time they will start her on Coumadin

## 2015-02-09 NOTE — Telephone Encounter (Signed)
I have tried numerous [place] [facility], [place] but they won't see unestablished patients. I called Juluis Pitch at [facility] to see if she could get involved with this since they did the last PT / INR. I informed her that Windell Hummingbird, PA was going to start the patient on a Lovenox bridge until we can get Mrs. Clodfelter established somewhere. Mariann Laster is calling Dr. Marcellus Scott to see if he will do the Coumadin management on this patient. She will call me back asap.

## 2015-02-11 ENCOUNTER — Telehealth: Payer: Self-pay

## 2015-02-11 NOTE — Telephone Encounter (Signed)
Phone call from pt.  Reported she fell on 02/01/15 at [facility], during discharge.  Stated "my right leg looks awful."  Reported she has several dark, soft blisters that she just noticed today.  Also reported there is redness surrounding the blisters.  Denied any bleeding or drainage.  Reported she is taking Lovenox and Coumadin.  Stated she wanted Dr. Bridgett Larsson to be made aware of her right leg condition.  Stated she has an appt. Today with Dr. Elijio Miles from [facility] for a hospital f/u.  Advised she should make Dr. Elijio Miles aware of the changes in her right leg, at the appt. today.  Verb. Understanding.  Advised will relay information to Dr. Bridgett Larsson.

## 2015-02-14 ENCOUNTER — Telehealth: Payer: Self-pay | Admitting: Internal Medicine

## 2015-02-14 NOTE — Telephone Encounter (Signed)
Patient does not understand why she has to get blood drawn at a location that she goes to in [place] and at our office as well. She states that one place should be enough. Can someone explain this to her?   [phone]

## 2015-02-15 NOTE — Telephone Encounter (Signed)
We do not take care of the levels needed for coumadin and that needs regular blood work.

## 2015-02-16 ENCOUNTER — Encounter: Payer: Self-pay | Admitting: Infectious Diseases

## 2015-02-16 ENCOUNTER — Ambulatory Visit: Payer: Self-pay | Admitting: Internal Medicine

## 2015-02-16 ENCOUNTER — Ambulatory Visit (INDEPENDENT_AMBULATORY_CARE_PROVIDER_SITE_OTHER): Payer: Medicare Other | Admitting: Infectious Diseases

## 2015-02-16 VITALS — BP 112/70 | HR 112 | Temp 98.0°F | Ht 64.0 in | Wt 93.0 lb

## 2015-02-16 DIAGNOSIS — I70209 Unspecified atherosclerosis of native arteries of extremities, unspecified extremity: Secondary | ICD-10-CM | POA: Diagnosis not present

## 2015-02-16 DIAGNOSIS — A0472 Enterocolitis due to Clostridium difficile, not specified as recurrent: Secondary | ICD-10-CM

## 2015-02-16 DIAGNOSIS — L02214 Cutaneous abscess of groin: Secondary | ICD-10-CM | POA: Diagnosis not present

## 2015-02-16 DIAGNOSIS — A047 Enterocolitis due to Clostridium difficile: Secondary | ICD-10-CM

## 2015-02-16 DIAGNOSIS — S82891A Other fracture of right lower leg, initial encounter for closed fracture: Secondary | ICD-10-CM | POA: Diagnosis not present

## 2015-02-16 MED ORDER — VANCOMYCIN 50 MG/ML ORAL SOLUTION: 125.0000 mg | Freq: Two times a day (BID) | ORAL | Status: AC

## 2015-02-16 NOTE — Assessment & Plan Note (Signed)
She is quite uncomfortable Will f/u with Dr Durward Fortes on 10-28

## 2015-02-16 NOTE — Telephone Encounter (Signed)
Spoke with pt, advised message from [place]. Pt understood. 

## 2015-02-16 NOTE — Progress Notes (Signed)
   Subjective:    Patient ID: Cynthia Bowen, female    DOB: 1946-02-23, 69 y.o.   MRN: [id number]  HPI 69 yo F with hx of vascular disease who has hx of R iliofem endarterectomy and R to L fem-fem bypass on 09-05-14.  Was seen in f/u and was complaining of L and R groun swelling. She was adm on 9-7 and underwent exploration of her graft. She was found to have bilateral groin abscesses. She had vac placement as well. Her Cx was polymicrobial.  She returned to OR on 9-9 and underwent repeat debridement, repeat bypass grafting.  Her course was further complicated by a L axillary seroma which was debrided on 9-20.  He was d/c on 9-21 on vanco/ceftaz with planned end date of 10-9. She has been transferred to po (her med list shows both keflex and augmentin).  Her course was further complicated by stepping off a curb and breaking her R ankle.   States her graft has been doing well. Wounds are well healed. No drainage from wounds.   In room she begins crying due to pain of her R ankle boot. Has f/u appt to see Dr Durward Fortes on 10-28.   States she was started on flagyl/bentyl recently for C diff (there is no record of this in the chart).    Review of Systems  Constitutional: Negative for fever, chills, appetite change and unexpected weight change.  Gastrointestinal: Negative for diarrhea and constipation.  Genitourinary: Negative for difficulty urinating.  Musculoskeletal: Positive for myalgias and arthralgias.  Please see HPI. 12 point ROS o/w (-)     Objective:   Physical Exam  Constitutional: She appears well-developed and well-nourished.  HENT:  Mouth/Throat: No oropharyngeal exudate.  Eyes: EOM are normal. Pupils are equal, round, and reactive to light.  Neck: Neck supple.  Cardiovascular: Normal rate, regular rhythm and normal heart sounds.   Pulmonary/Chest: Effort normal and breath sounds normal.  Abdominal: Soft. Bowel sounds are normal. There is no tenderness. There is no  rebound.  Musculoskeletal:       Arms:      Legs: Lymphadenopathy:    She has no cervical adenopathy.      Assessment & Plan:

## 2015-02-16 NOTE — Assessment & Plan Note (Signed)
By her hx.  There are no records of this in the computer.  Will stop her flagyl, change her to 1 month taper of vanco liquid.

## 2015-02-16 NOTE — Assessment & Plan Note (Signed)
Will stop keflex, continue augmentin indefinitely.  Will give her vanco taper to try and prevent C diff relapse.  Will see her back prn, defer to Dr Bridgett Larsson.

## 2015-02-18 ENCOUNTER — Ambulatory Visit: Payer: Medicare Other | Admitting: Internal Medicine

## 2015-02-23 ENCOUNTER — Ambulatory Visit (INDEPENDENT_AMBULATORY_CARE_PROVIDER_SITE_OTHER): Payer: Medicare Other | Admitting: Internal Medicine

## 2015-02-23 ENCOUNTER — Other Ambulatory Visit: Payer: Self-pay | Admitting: Family Medicine

## 2015-02-23 ENCOUNTER — Encounter: Payer: Self-pay | Admitting: Internal Medicine

## 2015-02-23 VITALS — BP 100/70 | HR 128 | Temp 99.6°F | Resp 18 | Ht 64.0 in | Wt 93.0 lb

## 2015-02-23 DIAGNOSIS — F418 Other specified anxiety disorders: Secondary | ICD-10-CM | POA: Diagnosis not present

## 2015-02-23 DIAGNOSIS — D649 Anemia, unspecified: Secondary | ICD-10-CM | POA: Diagnosis not present

## 2015-02-23 DIAGNOSIS — S99911D Unspecified injury of right ankle, subsequent encounter: Secondary | ICD-10-CM

## 2015-02-23 DIAGNOSIS — I70209 Unspecified atherosclerosis of native arteries of extremities, unspecified extremity: Secondary | ICD-10-CM | POA: Diagnosis not present

## 2015-02-23 DIAGNOSIS — M25571 Pain in right ankle and joints of right foot: Secondary | ICD-10-CM

## 2015-02-23 DIAGNOSIS — E46 Unspecified protein-calorie malnutrition: Secondary | ICD-10-CM | POA: Diagnosis not present

## 2015-02-23 DIAGNOSIS — R109 Unspecified abdominal pain: Secondary | ICD-10-CM | POA: Diagnosis not present

## 2015-02-23 MED ORDER — DICYCLOMINE HCL 20 MG PO TABS: ORAL_TABLET | ORAL | Status: DC

## 2015-02-24 NOTE — Progress Notes (Signed)
   Subjective:    Patient ID: Cynthia Bowen, female    DOB: 1945/07/11, 69 y.o.   MRN: [id number]  HPIf/u Feeling better except pain in R ankle in boot from pressure and friction anteriorly See recnt ND Fx Fib///Dr Durward Fortes  Vascular surgery because of femoral artery stenosis is finally stable multiple complications. Abdominal aorta stenosis was noted by Dr. Bridgett Larsson. She continues on Coumadin monitored by the [facility]  Her recurrent abdominal pain is currently quiet. She continues to get relief with dicyclomine and needs a refill. She is weaning vancomycin status post CDifF  She is not depressed today but just angry that her foot hurts  Review of Systems No additional symptoms    Objective:   Physical Exam Wt Readings from Last 3 Encounters:  02/23/15 93 lb (42.185 kg)  02/16/15 93 lb (42.185 kg)  02/08/15 94 lb 6.4 oz (42.82 kg)   in no distress BP 100/70 mmHg  Pulse 128  Temp(Src) 99.6 F (37.6 C)  Resp 18  Ht 5\' 4"  (1.626 m)  Wt 93 lb (42.185 kg)  BMI 15.96 kg/m2 Pulse is actually 90 exam Her right leg and foot have mild swelling along the distal fibula which is still tender. Anteriorly at the ankle is an area of abrasion with some purulence (she is on antibiotics) and several areas that are scabbed. This is tender but without cellulitis.      Assessment & Plan:  Recurrent abdominal pain--multiple causes, status post GI surgery with multiple complications 7672  Depression with anxiety--- with all the problems she's had over the last 4 years there is no wonder that she has mood issues, however she is stable today.  Pain in joint, ankle and foot, right--- an Ace wrap with Telfa padding issues to relieve the pressure anteriorly from her Cam Walker//she will continue to scrub this area and finish ab from Dr. Durward Fortes  Anemia, unspecified anemia type--she will follow-up in one month to have this rechecked  Protein calorie malnutrition (HCC)--at least she's had no  weight loss over the past month and is now prepared to increase her calories again  Right ankle injury, subsequent encounter--follow-up with Dr. Durward Fortes for fracture  Meds ordered this encounter  Medications  . dicyclomine (BENTYL) 20 MG tablet    Sig: TAKE 1 TABLET (20 MG TOTAL) BY MOUTH 4 (FOUR) TIMES DAILY - BEFORE MEALS AND AT BEDTIME.    Dispense:  120 tablet    Refill:  5   94mo

## 2015-02-25 NOTE — Telephone Encounter (Signed)
Faxed

## 2015-03-08 ENCOUNTER — Encounter: Payer: Self-pay | Admitting: Vascular Surgery

## 2015-03-11 ENCOUNTER — Encounter: Payer: Self-pay | Admitting: Vascular Surgery

## 2015-03-11 ENCOUNTER — Ambulatory Visit (INDEPENDENT_AMBULATORY_CARE_PROVIDER_SITE_OTHER): Payer: Medicare Other | Admitting: Vascular Surgery

## 2015-03-11 ENCOUNTER — Ambulatory Visit: Payer: Medicare Other | Admitting: Vascular Surgery

## 2015-03-11 VITALS — BP 131/71 | HR 114 | Ht 64.0 in | Wt 89.0 lb

## 2015-03-11 DIAGNOSIS — I70212 Atherosclerosis of native arteries of extremities with intermittent claudication, left leg: Secondary | ICD-10-CM

## 2015-03-11 NOTE — Addendum Note (Signed)
Addended by: Dorthula Rue L on: 03/11/2015 03:01 PM   Modules accepted: Orders

## 2015-03-11 NOTE — Progress Notes (Signed)
    Postoperative Visit   History of Present Illness  Cynthia Bowen is a 69 y.o. (07-09-1945) female  who presents for postoperative follow-up for: 1. Excision of infected fem-fem BPG, L Ax-PFA bypass (Date: 12/31/14). 2. Left axillary incision exploration and drainage of seroma (01/11/15).  The patient's post-surgical wounds are healed.  Fluid around the graft resolved.  She is having neuropathic pain in L thigh. Mobility is improving as is her PO intake. Per ID, she is continuing on Augmentin indefinitely.  Recently she fell and reported fx her R fib.  Reportedly the brace resulted in abrasion in the R ankle which lead to skin breakdown.  For VQI Use Only  PRE-ADM LIVING: Home  AMB STATUS: Ambulatory with Assistance   Physical Examination  Filed Vitals:   03/11/15 0927  BP: 131/71  Pulse: 114  Height: 5\' 4"  (1.626 m)  Weight: 89 lb (40.37 kg)  SpO2: 99%    R groin: healed L groin: healed, palpable pulse in L ax-PFA bypass L chest: healed, no fluid,  L ax exposure: healed, no fluid L ax-PFA route: healed, no fluid L foot: warm, viable foot with intact motor and sensation R foot: suspected full thickness skin necrosis with black eschar pre-tibial overlying the ankle    Medical Decision Making  Cynthia Bowen is a 69 y.o. (12/04/1945) female who presents s/p exc infected fem-fem BPG, L ax-PFA bypass, Ax seroma drainage, chronic malnutrition, depression sx, R pre-tibial full thickness skin necrosis .  Pt has healed all of her surgical incisions.  Unclear to me what exactly occurred in regards to this reported fibular fx, as I don't see any records.  Regardless, she has a significant wound beyond my capability to treat.  I am referring this patient ASAP to Plastic Surgery for evaluation and treatment.  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    In the last 6 months, she has not undergone TWO surgical exposures of the right common femoral artery  including the most recent on completed in the setting of infection.  Another exposure of the R CFA for a R fem-BK pop bypass might be technically impossible.  Could consider a R PFA to BK pop bypass but would have to be done with prosthetic graft, which is not a great idea in the setting of possibly chronically infected prosthetic patch in R groin and possibly infected aortic stent  Thank you for allowing [place] to participate in this patient's care.  Adele Barthel, MD [facility] Office: [phone] Pager: [phone]  03/11/2015, 2:07 PM

## 2015-03-14 ENCOUNTER — Inpatient Hospital Stay: Payer: Medicare Other | Admitting: Family Medicine

## 2015-03-16 ENCOUNTER — Telehealth: Payer: Self-pay

## 2015-03-16 NOTE — Telephone Encounter (Signed)
Phone call from pt.  Reported new area of swelling under the left arm, at level of left breast.  Stated the area of swelling is approx,. pea-sized.  C/o tenderness and warmth. Denied any redness or drainage. Denied fever/ chills. Stated it hurts if her arm brushes across the area of swelling.  Advised that there is no provider in office today, and with clinic closed Thurs. and Fri., she would have to be evaluated in the ER.  Verb. Understanding.  Stated she will continue to monitor it, and will go to the ER, if symptoms worsen.  Appt. offered for 03/23/15 with NP, to have area checked.  Pt. agreed.

## 2015-03-21 ENCOUNTER — Encounter: Payer: Self-pay | Admitting: Internal Medicine

## 2015-03-21 ENCOUNTER — Encounter: Payer: Self-pay | Admitting: Family

## 2015-03-23 ENCOUNTER — Encounter: Payer: Self-pay | Admitting: Family

## 2015-03-23 ENCOUNTER — Ambulatory Visit (INDEPENDENT_AMBULATORY_CARE_PROVIDER_SITE_OTHER): Payer: Medicare Other | Admitting: Family

## 2015-03-23 VITALS — BP 133/78 | HR 88 | Temp 98.5°F | Resp 16 | Ht 64.0 in | Wt 89.0 lb

## 2015-03-23 DIAGNOSIS — Z95828 Presence of other vascular implants and grafts: Secondary | ICD-10-CM

## 2015-03-23 DIAGNOSIS — I739 Peripheral vascular disease, unspecified: Secondary | ICD-10-CM

## 2015-03-23 DIAGNOSIS — Z48812 Encounter for surgical aftercare following surgery on the circulatory system: Secondary | ICD-10-CM

## 2015-03-23 DIAGNOSIS — L97312 Non-pressure chronic ulcer of right ankle with fat layer exposed: Secondary | ICD-10-CM

## 2015-03-23 DIAGNOSIS — Z87891 Personal history of nicotine dependence: Secondary | ICD-10-CM

## 2015-03-23 MED ORDER — AMOXICILLIN-POT CLAVULANATE 875-125 MG PO TABS: 1.0000 | ORAL_TABLET | Freq: Two times a day (BID) | ORAL | Status: DC

## 2015-03-23 NOTE — Progress Notes (Signed)
Postoperative Visit   History of Present Illness  Cynthia Bowen is a 69 y.o. year old female patient of Dr. Bridgett Larsson who is s/p Excision of infected fem-fem BPG, L Ax-PFA bypass (Date: 12/31/14). and Left axillary incision exploration and drainage of seroma (01/11/15). She is also s/p Aortic stenting, L CIA stenting for L CIA jailing caused by downward tick of aortic stent (08/26/14,  and R iliofem EA w/ DPA, R to L fem-fem BPG (Dr. Donnetta Hutching) on 09/05/14 for L CIA stent occlusion.   The patient's post-surgical wounds are healed. Fluid around the graft resolved. She is having ischemic pain in right ankle and foot. Per ID, she is continuing on Augmentin indefinitely; however, pt states that one of her providers stopped the last antibiotic she was taking some time ago.  Recently she fell and reported fx her R fib. Reportedly the brace resulted in abrasion in the R ankle which lead to skin breakdown.  Dr. Bridgett Larsson last saw pt on 03/11/15. At that time:  R groin: healed L groin: healed, palpable pulse in L ax-PFA bypass L chest: healed, no fluid,  L ax exposure: healed, no fluid L ax-PFA route: healed, no fluid L foot: warm, viable foot with intact motor and sensation R foot: suspected full thickness skin necrosis with black eschar pre-tibial overlying the ankle  The pt is s/p exc infected fem-fem BPG, L ax-PFA bypass, Ax seroma drainage, chronic malnutrition, depression sx, R pre-tibial full thickness skin necrosis   Pt has healed all of her surgical incisions.  Unclear to Dr. Bridgett Larsson what exactly occurred in regards to this reported fibular fx, as he did not see any records.  Regardless, she has a significant wound beyond Dr. Bridgett Larsson capability to treat.  Dr. Bridgett Larsson referred patient to Plastic Surgery for evaluation and treatment.  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   In the last 6 months, she has not undergone TWO surgical exposures of the right common femoral artery including  the most recent on completed in the setting of infection.  Another exposure of the R CFA for a R fem-BK pop bypass might be technically impossible. Could consider a R PFA to BK pop bypass but would have to be done with prosthetic graft, which is not a great idea in the setting of possibly chronically infected prosthetic patch in R groin and possibly infected aortic stent  The pt returns today with c/o new area of swelling under the left arm, at level of left breast. Stated the area of swelling is approx,. pea-sized. C/o tenderness and warmth. Denied any redness or drainage. Denied fever/ chills. Stated it hurts if her arm brushes across the area of swelling. Advised that there is no provider in office on 03/16/15 when she called and spoke with our triage nurse, and with clinic closed Thurs. and Fri., she would have to be evaluated in the ER. Verb. Understanding. Stated she will continue to monitor it, and will go to the ER, if symptoms worsen. Appt. offered for 03/23/15 with NP, to have area checked. Pt. agreed.    For VQI Use Only  PRE-ADM LIVING: Home  AMB STATUS: Ambulatory with rolling seated walker  Physical Examination  Filed Vitals:   03/23/15 1423  BP: 133/78  Pulse: 88  Temp: 98.5 F (36.9 C)  TempSrc: Oral  Resp: 16  Height: 5\' 4"  (1.626 m)  Weight: 89 lb (40.37 kg)  SpO2: 100%   Body mass index is 15.27 kg/(m^2).  Doppler signals present at left ax-fem graft, right PT and DP, left DP only. Left ax-fem graft site is well healed. Right anterior ankle open wound with eschar and fibrinous exudate, measures about 3 cm x 5 cm; mild erythema surrounding the open wound.  Medical Decision Making  Cynthia Bowen is a 69 y.o. year old female who presents s/p  excision of infected fem-fem BPG, L Ax-PFA bypass (Date: 12/31/14). and Left axillary incision exploration and drainage of seroma (01/11/15). She is also s/p Aortic stenting, L CIA stenting for L CIA jailing caused  by downward tick of aortic stent (08/26/14,  and R iliofem EA w/ DPA, R to L fem-fem BPG (Dr. Donnetta Hutching) on 09/05/14 for L CIA stent occlusion.    The patient's bypass incisions are healed.  Dr. Bridgett Larsson spoke with and examined pt. He does not think left ax-fem graft is infected.  She does have mild erythema surrounding the right anterior aspect ankle wound. Augmentin [id number], 1 tablet po twice daily x 10 days, 0 refills.  Pt has an appointment with Dr. Theodoro Kos, plastic surgeon, in 2 days, to address the right ankle non healing wound. She is currently receiving home health dry dressing changes once/week and the pt changes the dressing every other day with non adherent dressing and kerlex.              Follow up with Dr. Bridgett Larsson as already scheduled on 04/08/15 for right foot wound check.    Cynthia Bowen, Sharmon Leyden, RN, MSN, FNP-C [facility] Office: [phone]  03/23/2015, 2:33 PM  Clinic MD: Bridgett Larsson

## 2015-04-01 ENCOUNTER — Encounter: Payer: Self-pay | Admitting: Vascular Surgery

## 2015-04-05 ENCOUNTER — Telehealth: Payer: Self-pay

## 2015-04-05 ENCOUNTER — Other Ambulatory Visit: Payer: Self-pay | Admitting: Plastic Surgery

## 2015-04-05 DIAGNOSIS — S91001A Unspecified open wound, right ankle, initial encounter: Secondary | ICD-10-CM

## 2015-04-05 NOTE — Telephone Encounter (Signed)
Cynthia Bowen with [facility] and she needs to get a verbal order to continue care   Beat number [phone]

## 2015-04-05 NOTE — Telephone Encounter (Signed)
Ok to give them ok for whatever but see if we can get a list of what we are agreeing to

## 2015-04-06 ENCOUNTER — Ambulatory Visit (INDEPENDENT_AMBULATORY_CARE_PROVIDER_SITE_OTHER): Payer: Medicare Other | Admitting: Internal Medicine

## 2015-04-06 ENCOUNTER — Telehealth: Payer: Self-pay

## 2015-04-06 ENCOUNTER — Encounter: Payer: Self-pay | Admitting: Internal Medicine

## 2015-04-06 VITALS — BP 130/70 | HR 92 | Temp 98.6°F | Resp 16 | Ht 64.0 in | Wt 90.0 lb

## 2015-04-06 DIAGNOSIS — D649 Anemia, unspecified: Secondary | ICD-10-CM | POA: Diagnosis not present

## 2015-04-06 DIAGNOSIS — F418 Other specified anxiety disorders: Secondary | ICD-10-CM | POA: Diagnosis not present

## 2015-04-06 DIAGNOSIS — S99911D Unspecified injury of right ankle, subsequent encounter: Secondary | ICD-10-CM

## 2015-04-06 DIAGNOSIS — I70209 Unspecified atherosclerosis of native arteries of extremities, unspecified extremity: Secondary | ICD-10-CM | POA: Diagnosis not present

## 2015-04-06 DIAGNOSIS — E46 Unspecified protein-calorie malnutrition: Secondary | ICD-10-CM

## 2015-04-06 DIAGNOSIS — R109 Unspecified abdominal pain: Secondary | ICD-10-CM | POA: Diagnosis not present

## 2015-04-06 NOTE — Telephone Encounter (Signed)
1 time a week  2 times a week 2 PRN visits for wound dressing  Left detailed verbal orders for patient on voicemail.

## 2015-04-06 NOTE — Telephone Encounter (Signed)
Dr. Laney Pastor was the last physician to see her in our practice and is listed as her PCP. I will forward this to him. Please let me know if this is not correct, Dr. Laney Pastor and send it back to our PA-Pool if we need to route it elsewhere. Thank you!

## 2015-04-06 NOTE — Progress Notes (Signed)
   Subjective:    Patient ID: Cynthia Bowen, female    DOB: 1945/08/15, 69 y.o.   MRN: [id number]  HPI Patient Active Problem List   Diagnosis Date Noted  . Recurrent abdominal pain--multiple causes, status post GI surgery with multiple complications [id number]    Priority: High Stable--eating better--2lb wt gain  . Atherosclerosis of native arteries of extremity with intermittent claudication ([place]) 01/15/2014    Priority: Medium  . Pancreatic lesion-6 mm noted on MRI at [facility] 10/08/2013    Priority: Medium  . Femoral artery stenosis-bilateral noted CT at [facility] 2015 10/08/2013    Priority: Medium  . Depression with anxiety 04/02/2011    Priority: Medium Much better now that health is improving  . Enteritis due to Clostridium difficile 02/16/2015  . Ankle fracture, right---fx site feel s much better Dr Whitfield////but boot caused nonhealing pressure sore anteriorly and she is scheduled for plastics debridement in 1 week 02/03/2015  . Abnormal finding on mammography 12/31/2014  . Groin abscess 12/30/2014  . Groin pain 12/29/2014  . Chronic pain associated with significant psychosocial dysfunction 12/15/2014  . Wound drainage-Right groin 11/15/2014  . Ischemia of lower extremity 09/05/2014  . Abdominal aortic stenosis 09/03/2014  . PAD (peripheral artery disease) ([place]) 08/26/2014  . Discoloration of skin- Left Heel 08/13/2014  . Left foot pain- Heel 08/13/2014  . Leg heaviness-Bilateral 08/13/2014  . Protein-calorie malnutrition, severe ([place]) 06/04/2014  . Cellulitis of hand 06/02/2014  . Hypokalemia 06/02/2014  . Osteoporosis 05/22/2014  . Nephrolithiasis-asymptomatic/noted on scan 10/08/2013  . Other and unspecified ovarian cyst--noted on scans 2014 2015/asymptomatic 10/08/2013  . History of pulmonary embolism 08/05/2012  . Incarcerated epigastric hernia s/p primary repair 07/02/2012 07/11/2012  . Gastric perforation with abscess/peritonitis s/p  omental patch repair x2 [id number] 07/10/2012  . Pericardial effusion 04/17/2012  . COPD GOLD I 01/29/2012  . Back pain DDD S/p surgery [place] 1993 12/29/2011  . GERD (gastroesophageal reflux disease) 04/02/2011  . Osteoarthritis 04/02/2011  . Cervical neck pain with evidence of disc disease-s/p surgery [id number] 04/02/2011  . Fibromyalgia-[place] 2004 04/02/2011   Her overall demeanor is greatly improved compared to her last office visit. Has a lot to do with resolution of much of her arteriosclerotic disease complications. Her stomach is also better and she is able. She is not requiring as much pain medicine.   Review of Systems No fever chills or night sweats Fatigue improving Weight improving No chest pain or shortness of breath    Objective:   Physical Exam BP 130/70 mmHg  Pulse 92  Temp(Src) 98.6 F (37 C) (Oral)  Resp 16  Ht 5\' 4"  (1.626 m)  Wt 90 lb (40.824 kg)  BMI 15.44 kg/m2  SpO2 98% HEENT clear Heart regular without murmur Extremities with nonhealing pressure ulcer right anterior ankle Her mood is good today and affect appropriate Thought content normal Ankle under bandage     Assessment & Plan:  Recurrent abdominal pain--multiple causes, status post GI surgery with multiple complications [id number] --finally stable  Right ankle injury leading to upcoming surgery  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

## 2015-04-06 NOTE — Telephone Encounter (Signed)
Estill Bamberg called stated she would like to continue seeing patient for home health. Nursing 1 time a week for this week, 2 times a week for next week, and 2 PRN visits for wound dressing malfunction.  Please advise Estill Bamberg if you have any more questions  [phone]

## 2015-04-06 NOTE — Telephone Encounter (Signed)
Left message for pt to call back  °

## 2015-04-06 NOTE — Telephone Encounter (Signed)
Give approval

## 2015-04-06 NOTE — Telephone Encounter (Signed)
Spoke to Bethany today.

## 2015-04-06 NOTE — Pre-Procedure Instructions (Signed)
Cynthia Bowen  04/06/2015      [facility] - [place], [place] - [address] [place] [place] [address] Phone: [phone] Fax: [phone]  [id number] [address] [place] [address] Phone: [phone] Fax: [phone]    Your procedure is scheduled on Wed, Dec 21 @ 12:30 PM  Report to [facility] Admitting at 10:30 AM  Call this number if you have problems the morning of surgery:  [phone]   Remember:  Do not eat food or drink liquids after midnight.  Take these medicines the morning of surgery with A SIP OF WATER Lyrica(Pregabalin),Zofran(Ondansetron-if needed),Pain Pill(if needed),ProAir<Bring Your Inhaler With You>, and Zantac(Ranitidine)                No Goody's,BC's,Aleve,Aspirin,Ibuprofen,Advil,Motrin,Fish Oil,or any Herbal Medications.    Do not wear jewelry, make-up or nail polish.  Do not wear lotions, powders, or perfumes.  You may wear deodorant.  Do not shave 48 hours prior to surgery.    Do not bring valuables to the hospital.  [facility] is not responsible for any belongings or valuables.  Contacts, dentures or bridgework may not be worn into surgery.  Leave your suitcase in the car.  After surgery it may be brought to your room.  For patients admitted to the hospital, discharge time will be determined by your treatment team.  Patients discharged the day of surgery will not be allowed to drive home.    Special instructions:  [facility] - Preparing for Surgery  Before surgery, you can play an important role.  Because skin is not sterile, your skin needs to be as free of germs as possible.  You can reduce the number of germs on you skin by washing with CHG (chlorahexidine gluconate) soap before surgery.  CHG is an antiseptic cleaner which kills germs and bonds with the skin to continue killing germs even after washing.  Please DO NOT  use if you have an allergy to CHG or antibacterial soaps.  If your skin becomes reddened/irritated stop using the CHG and inform your nurse when you arrive at Short Stay.  Do not shave (including legs and underarms) for at least 48 hours prior to the first CHG shower.  You may shave your face.  Please follow these instructions carefully:   1.  Shower with CHG Soap the night before surgery and the                                morning of Surgery.  2.  If you choose to wash your hair, wash your hair first as usual with your       normal shampoo.  3.  After you shampoo, rinse your hair and body thoroughly to remove the                      Shampoo.  4.  Use CHG as you would any other liquid soap.  You can apply chg directly       to the skin and wash gently with scrungie or a clean washcloth.  5.  Apply the CHG Soap to your body ONLY FROM THE NECK DOWN.        Do not use on open wounds or open sores.  Avoid contact with your eyes,  ears, mouth and genitals (private parts).  Wash genitals (private parts)       with your normal soap.  6.  Wash thoroughly, paying special attention to the area where your surgery        will be performed.  7.  Thoroughly rinse your body with warm water from the neck down.  8.  DO NOT shower/wash with your normal soap after using and rinsing off       the CHG Soap.  9.  Pat yourself dry with a clean towel.            10.  Wear clean pajamas.            11.  Place clean sheets on your bed the night of your first shower and do not        sleep with pets.  Day of Surgery  Do not apply any lotions/deoderants the morning of surgery.  Please wear clean clothes to the hospital/surgery center.    Please read over the following fact sheets that you were given. Pain Booklet, Coughing and Deep Breathing and Surgical Site Infection Prevention

## 2015-04-06 NOTE — Telephone Encounter (Signed)
Not sure which provider this needs to be sent too

## 2015-04-07 ENCOUNTER — Encounter (HOSPITAL_COMMUNITY): Payer: Self-pay

## 2015-04-07 ENCOUNTER — Encounter (HOSPITAL_COMMUNITY)
Admission: RE | Admit: 2015-04-07 | Discharge: 2015-04-07 | Disposition: A | Discharge status: Home / Self Care | Payer: Medicare Other | Source: Ambulatory Visit | Attending: Plastic Surgery | Admitting: Plastic Surgery

## 2015-04-07 VITALS — BP 108/59 | HR 95 | Temp 98.2°F | Resp 18 | Ht 64.0 in | Wt 89.7 lb

## 2015-04-07 DIAGNOSIS — Z86711 Personal history of pulmonary embolism: Secondary | ICD-10-CM | POA: Insufficient documentation

## 2015-04-07 DIAGNOSIS — J449 Chronic obstructive pulmonary disease, unspecified: Secondary | ICD-10-CM | POA: Insufficient documentation

## 2015-04-07 DIAGNOSIS — Z01818 Encounter for other preprocedural examination: Secondary | ICD-10-CM | POA: Insufficient documentation

## 2015-04-07 DIAGNOSIS — Z79899 Other long term (current) drug therapy: Secondary | ICD-10-CM | POA: Diagnosis not present

## 2015-04-07 DIAGNOSIS — Z7901 Long term (current) use of anticoagulants: Secondary | ICD-10-CM | POA: Diagnosis not present

## 2015-04-07 DIAGNOSIS — I739 Peripheral vascular disease, unspecified: Secondary | ICD-10-CM | POA: Insufficient documentation

## 2015-04-07 DIAGNOSIS — K219 Gastro-esophageal reflux disease without esophagitis: Secondary | ICD-10-CM | POA: Insufficient documentation

## 2015-04-07 DIAGNOSIS — Z01812 Encounter for preprocedural laboratory examination: Secondary | ICD-10-CM | POA: Diagnosis not present

## 2015-04-07 DIAGNOSIS — X58XXXA Exposure to other specified factors, initial encounter: Secondary | ICD-10-CM | POA: Diagnosis not present

## 2015-04-07 DIAGNOSIS — Z87891 Personal history of nicotine dependence: Secondary | ICD-10-CM | POA: Insufficient documentation

## 2015-04-07 DIAGNOSIS — S91001A Unspecified open wound, right ankle, initial encounter: Secondary | ICD-10-CM | POA: Insufficient documentation

## 2015-04-07 HISTORY — DX: Other pulmonary embolism without acute cor pulmonale: I26.99

## 2015-04-07 LAB — BASIC METABOLIC PANEL
ANION GAP: 9 (ref 5–15)
BUN: 9 mg/dL (ref 6–20)
CALCIUM: 9.5 mg/dL (ref 8.9–10.3)
CO2: 25 mmol/L (ref 22–32)
Chloride: 108 mmol/L (ref 101–111)
Creatinine, Ser: 0.65 mg/dL (ref 0.44–1.00)
Glucose, Bld: 100 mg/dL — ABNORMAL HIGH (ref 65–99)
Potassium: 2.9 mmol/L — ABNORMAL LOW (ref 3.5–5.1)
SODIUM: 142 mmol/L (ref 135–145)

## 2015-04-07 LAB — CBC
HCT: 38.9 % (ref 36.0–46.0)
HEMOGLOBIN: 12.5 g/dL (ref 12.0–15.0)
MCH: 25.7 pg — ABNORMAL LOW (ref 26.0–34.0)
MCHC: 32.1 g/dL (ref 30.0–36.0)
MCV: 80 fL (ref 78.0–100.0)
Platelets: 254 10*3/uL (ref 150–400)
RBC: 4.86 MIL/uL (ref 3.87–5.11)
RDW: 20.3 % — ABNORMAL HIGH (ref 11.5–15.5)
WBC: 6.3 10*3/uL (ref 4.0–10.5)

## 2015-04-07 LAB — SURGICAL PCR SCREEN
MRSA, PCR: NEGATIVE
STAPHYLOCOCCUS AUREUS: NEGATIVE

## 2015-04-07 NOTE — Progress Notes (Signed)
Called Dr. Leafy Ro office re: patient's coumadin.  Left message for nurse.

## 2015-04-08 ENCOUNTER — Telehealth: Payer: Self-pay | Admitting: Physician Assistant

## 2015-04-08 ENCOUNTER — Ambulatory Visit (INDEPENDENT_AMBULATORY_CARE_PROVIDER_SITE_OTHER): Payer: Medicare Other | Admitting: Vascular Surgery

## 2015-04-08 ENCOUNTER — Encounter (HOSPITAL_COMMUNITY): Payer: Self-pay | Admitting: Emergency Medicine

## 2015-04-08 ENCOUNTER — Encounter: Payer: Self-pay | Admitting: Vascular Surgery

## 2015-04-08 VITALS — BP 129/52 | HR 71 | Temp 99.0°F | Resp 16 | Ht 65.0 in | Wt 90.0 lb

## 2015-04-08 DIAGNOSIS — Q253 Supravalvular aortic stenosis: Secondary | ICD-10-CM

## 2015-04-08 DIAGNOSIS — I998 Other disorder of circulatory system: Secondary | ICD-10-CM

## 2015-04-08 DIAGNOSIS — Q251 Coarctation of aorta: Secondary | ICD-10-CM

## 2015-04-08 DIAGNOSIS — I70212 Atherosclerosis of native arteries of extremities with intermittent claudication, left leg: Secondary | ICD-10-CM

## 2015-04-08 NOTE — Progress Notes (Signed)
Anesthesia Chart Review:  Pt is 69 year old female scheduled for R ankle debridement with possible acell placement and vac on 04/13/2015 with Dr. Marla Roe.   PMH includes:  PVD (s/p multiple PVD surgeries, most recently removal infected fem-fem BG, L axillary artery to L fem BG, R fem artery patch angioplasty 12/31/14), PE, COPD, anemia, heart murmur (when young), GERD. Former smoker. BMI 15. S/p small bowel resection 2014. S/p aortic endovascular stent 08/26/14.   Medications include: augmentin, lovenox, dilaudid, flagyl, albuterol, coumadin.   Preoperative labs reviewed.  K 2.9.   1 view CXR 08/26/14: Right jugular central venous catheter with the tip projecting over the SVC. There is stable left costophrenic angle pleural thickening. There is no focal consolidation or pneumothorax. The heart and mediastinal contours are unremarkable. The osseous structures are unremarkable.  EKG 12/30/14: Sinus bradycardia (56 bpm). Low voltage QRS  Echo 10/21/13:  - Left ventricle: The cavity size was normal. Systolic function was vigorous. The estimated ejection fraction was in the range of 65% to 70%. Wall motion was normal; there were no regional wall motion abnormalities. - Pericardium, extracardiac: A small pericardial effusion was identified circumferential to the heart. There was no evidence of hemodynamic compromise.  It is unclear when/if coumadin has been stopped. Spoke with Maudie Mercury in Dr. Eusebio Friendly office, asked about plan for coumadin and notified about low K.  Will recheck K DOS.   Willeen Cass, FNP-BC [facility] Short Stay Surgical Center/Anesthesiology Phone: [phone] 04/08/2015 4:53 PM

## 2015-04-08 NOTE — Progress Notes (Signed)
    Postoperative Visit   History of Present Illness  Cynthia Bowen is a 69 y.o. (08/12/1945) female  who presents for postoperative follow-up for: 1. Excision of infected fem-fem BPG, L Ax-PFA bypass (Date: 12/31/14). 2. Left axillary incision exploration and drainage of seroma (01/11/15).  All surgical incisions are healed.  The patient is scheduled for debridement with Dr. Marla Roe this coming week.  Pt thinks her wound looks better  For VQI Use Only  PRE-ADM LIVING: Home  AMB STATUS: Ambulatory with Assistance   Physical Examination  Filed Vitals:   04/08/15 1056  BP: 129/52  Pulse: 71  Temp: 99 F (37.2 C)  Resp: 16  Height: 5\' 5"  (1.651 m)  Weight: 90 lb (40.824 kg)  SpO2: 100%    R groin: healed L groin: healed, palpable pulse in L ax-PFA bypass L chest: healed, no fluid,  L ax exposure: healed, no fluid L ax-PFA route: healed, no fluid L foot: warm, viable foot with intact motor and sensation R foot: eschar separated with clean base, subcutaneous tissue appear yellow and desiccated  Medical Decision Making  Cynthia Bowen is a 69 y.o. (November 30, 1945) female  who presents s/p Aortic stenting, s/p exc infected fem-fem BPG, L ax-PFA bypass, Ax seroma drainage, chronic malnutrition, depression sx, R pre-tibial full thickness skin necrosis .  Awaiting outcomes of surgical debridement.    If the patient fails to heal with wound care maneuver, could consider a R PFA to BK pop bypass but would have to be done with prosthetic graft, which is not a great idea in the setting of possibly chronically infected prosthetic patch in R groin and possibly infected aortic stent  Thank you for allowing [place] to participate in this patient's care.  Adele Barthel, MD [facility] Office: [phone] Pager: [phone]  04/08/2015, 11:24 AM

## 2015-04-08 NOTE — Telephone Encounter (Deleted)
Patient was scheduled for surgery next week, but is very hypokalemic. Called patient about abnormal labs and recommended she contact the PCP concerning the low potassium level.  I discussed with Dr. Marla Roe and she has recommended cancelling surgery at this point and rescheduling for early January.   Jenie Parish,PA-C Plastic Surgery [phone]

## 2015-04-09 ENCOUNTER — Other Ambulatory Visit: Payer: Medicare Other | Admitting: Internal Medicine

## 2015-04-09 ENCOUNTER — Telehealth: Payer: Self-pay | Admitting: Family Medicine

## 2015-04-09 ENCOUNTER — Other Ambulatory Visit (INDEPENDENT_AMBULATORY_CARE_PROVIDER_SITE_OTHER): Payer: Medicare Other | Admitting: *Deleted

## 2015-04-09 ENCOUNTER — Telehealth: Payer: Self-pay | Admitting: Physician Assistant

## 2015-04-09 DIAGNOSIS — E876 Hypokalemia: Secondary | ICD-10-CM | POA: Diagnosis not present

## 2015-04-09 LAB — BASIC METABOLIC PANEL
BUN: 11 mg/dL (ref 7–25)
CHLORIDE: 111 mmol/L — AB (ref 98–110)
CO2: 29 mmol/L (ref 20–31)
Calcium: 8.9 mg/dL (ref 8.6–10.4)
Creat: 0.64 mg/dL (ref 0.50–0.99)
Glucose, Bld: 82 mg/dL (ref 65–99)
POTASSIUM: 3.8 mmol/L (ref 3.5–5.3)
SODIUM: 143 mmol/L (ref 135–146)

## 2015-04-09 NOTE — Progress Notes (Signed)
See repeat labs---2.9 must have been lab error as we didn't do anything to replace her potassium

## 2015-04-09 NOTE — Telephone Encounter (Signed)
I called and spoke with patient - we will not call in the potassium for her.

## 2015-04-09 NOTE — Telephone Encounter (Signed)
Received a call from the answering service regarding stat labs- this was to recheck her K level.  It is ok. Called pt and let her know.  She complained to me that "I had to go through all that" but I explained that sometimes K levels can be inaccurate and that is why we re-check them.   Results for orders placed or performed in visit on [id number]  Basic metabolic panel  Result Value Ref Range   Sodium 143 135 - 146 mmol/L   Potassium 3.8 3.5 - 5.3 mmol/L   Chloride 111 (H) 98 - 110 mmol/L   CO2 29 20 - 31 mmol/L   Glucose, Bld 82 65 - 99 mg/dL   BUN 11 7 - 25 mg/dL   Creat 0.64 0.50 - 0.99 mg/dL   Calcium 8.9 8.6 - 10.4 mg/dL

## 2015-04-09 NOTE — Telephone Encounter (Signed)
Called and spoke with the patient.  She will come into the clinic this afternoon for a stat K level.  She is feeling fine and having no diarrhea from the abx.  I have ordered a stat K and if it is still low we will start KCl 69meq bid until she is seen on Monday by [name] at 102.

## 2015-04-11 NOTE — Telephone Encounter (Signed)
Low K was likely lab error

## 2015-04-13 ENCOUNTER — Encounter (HOSPITAL_COMMUNITY): Admission: RE | Payer: Self-pay | Source: Ambulatory Visit

## 2015-04-13 SURGERY — IRRIGATION AND DEBRIDEMENT EXTREMITY
Anesthesia: General | Site: Ankle | Laterality: Right

## 2015-04-16 ENCOUNTER — Ambulatory Visit (INDEPENDENT_AMBULATORY_CARE_PROVIDER_SITE_OTHER): Payer: Medicare Other | Admitting: Internal Medicine

## 2015-04-16 VITALS — BP 120/68 | HR 89 | Temp 98.0°F | Resp 16 | Ht 64.25 in | Wt 89.4 lb

## 2015-04-16 DIAGNOSIS — I70209 Unspecified atherosclerosis of native arteries of extremities, unspecified extremity: Secondary | ICD-10-CM

## 2015-04-16 DIAGNOSIS — S91001D Unspecified open wound, right ankle, subsequent encounter: Secondary | ICD-10-CM | POA: Diagnosis not present

## 2015-04-16 NOTE — Progress Notes (Signed)
Subjective:  By signing my name below, I, Rawaa Al Rifaie, attest that this documentation has been prepared under the direction and in the presence of [name], MD.  Royann Shivers Rifaie, Medical Scribe. 04/16/2015.  11:32 AM.  I have completed the patient encounter in its entirety as documented by the scribe, with editing by me where necessary. Enzley Kitchens P. Laney Pastor, M.D.    Patient ID: Cynthia Bowen, female    DOB: 05/31/1945, 69 y.o.   MRN: [id number]  Chief Complaint  Patient presents with  . medication consult    warfarin    HPI HPI Comments: Cynthia Bowen is a 69 y.o. female who presents to [facility] for a medication consult regarding her warfarin. She is confused about what to do. Pt reports that she has not received her blood work results taken  [place] phone consult with rehab physician at [facility] weekly. Although they told her to take 5mg  coumadin daily this week.  Question seems to be how to change coumadin for surgery per Dr Migdalia Dk.   Pt notes that the wound on her right ankle is slowly healing, and indicates that it has decreased in circumference, however the length is still the same. She notes that it is still experiencing difficulty standing up secondary to the pain. She states that she is keeping the wound area clean and has taken antibiotics for it. She is not clear for surgery due to being on warfarin, and the surgical staff need to her blood work results prior to setting a date for that. Pt notes that she has been on coumadin since the day of her last surgery on 09/07.       Allergies  Allergen Reactions  . Avelox [Moxifloxacin Hcl In Nacl] Nausea And Vomiting  . Betadine [Povidone Iodine] Itching and Rash  . Alendronate Sodium Nausea And Vomiting and Other (See Comments)    dizziness  . Aspirin Nausea Only  . Codeine Nausea And Vomiting  . Doxycycline Nausea And Vomiting  . Fluconazole Nausea And Vomiting  .  Hydrocodone Nausea And Vomiting    GI distress  . Hydrocodone-Acetaminophen Nausea And Vomiting  . Morphine And Related Nausea Only  . Neurontin [Gabapentin] Other (See Comments)    Mood changes   . Nsaids Other (See Comments)    Severe gastritis & perforation - avoid NSAIDs when possible  . Quinolones Hives and Itching  . Sertraline Hcl Nausea And Vomiting and Other (See Comments)    Hallucinations   . Sulfamethoxazole Hives and Itching  . Latex Rash  . Sulfa Antibiotics Rash   Prior to Admission medications   Medication Sig Start Date End Date Taking? Authorizing Provider  acetaminophen (TYLENOL) 500 MG tablet Take 1,000 mg by mouth every 8 (eight) hours as needed for mild pain or moderate pain.   Yes Historical Provider, MD  dicyclomine (BENTYL) 20 MG tablet Take 20 mg by mouth 4 (four) times daily -  before meals and at bedtime. Reported on 04/16/2015   Yes Historical Provider, MD  fluocinonide cream (LIDEX) [id number] % Apply 1 application topically 2 (two) times daily as needed. Reported on 04/06/2015 11/07/14  Yes Historical Provider, MD  ketotifen (ZADITOR) 0.025 % ophthalmic solution Place 1 drop into both eyes 2 (two) times daily.   Yes Historical Provider, MD  Multiple Vitamin (MULTIVITAMIN WITH MINERALS) TABS tablet Take 1 tablet by mouth daily. 01/12/15  Yes Samantha J Rhyne, PA-C  ondansetron (ZOFRAN) 4 MG  tablet Take 4 mg by mouth every 8 (eight) hours as needed for nausea or vomiting.  11/23/14  Yes Historical Provider, MD  pregabalin (LYRICA) 50 MG capsule Take 50-100 mg by mouth 3 (three) times daily. 1 capsule twice daily, and 2 capsules at bedtime   Yes Historical Provider, MD  PROAIR HFA 108 (90 BASE) MCG/ACT inhaler Inhale 1-2 puffs into the lungs every 4 (four) hours as needed for wheezing or shortness of breath. Reported on 04/16/2015 09/13/14  Yes Historical Provider, MD  Probiotic Product (PROBIOTIC DAILY PO) Take 1 capsule by mouth daily.   Yes Historical Provider, MD    ranitidine (ZANTAC) 150 MG tablet TAKE 1 TABLET TWICE DAILY 11/08/14  Yes [name] Koyanagi, MD  Simethicone (GAS-X EXTRA STRENGTH PO) Take 1 tablet by mouth 3 (three) times daily after meals.    Yes Historical Provider, MD  vitamin C (ASCORBIC ACID) 500 MG tablet Take 500 mg by mouth daily.   Yes Historical Provider, MD  Vitamin D, Ergocalciferol, (DRISDOL) 50000 UNITS CAPS capsule Take 1 capsule (50,000 Units total) by mouth every 7 (seven) days. Patient taking differently: Take 50,000 Units by mouth every 7 (seven) days. On Saturday 06/29/14  Yes Robyn Haber, MD  warfarin (COUMADIN) 1 MG tablet  04/02/15  Yes Historical Provider, MD  warfarin (COUMADIN) 4 MG tablet  03/23/15  Yes Historical Provider, MD  warfarin (COUMADIN) 5 MG tablet  03/16/15  Yes Historical Provider, MD  zinc gluconate 50 MG tablet Take 50 mg by mouth daily.   Yes Historical Provider, MD  amoxicillin-clavulanate (AUGMENTIN) 875-125 MG tablet Reported on 04/16/2015 03/23/15   Historical Provider, MD  cephALEXin (KEFLEX) 500 MG capsule Reported on 04/16/2015 02/01/15   Historical Provider, MD  cyclobenzaprine (FLEXERIL) 5 MG tablet Reported on 04/16/2015 02/04/15   Historical Provider, MD  enoxaparin (LOVENOX) 80 MG/0.8ML injection Reported on 04/16/2015 02/08/15   Historical Provider, MD  feeding supplement, RESOURCE BREEZE, (RESOURCE BREEZE) LIQD Take 1 Container by mouth 3 (three) times daily between meals. Patient not taking: Reported on 04/16/2015 06/05/14   Modena Jansky, MD  HYDROmorphone (DILAUDID) 2 MG tablet Reported on 04/16/2015 02/01/15   Historical Provider, MD  LORazepam (ATIVAN) 1 MG tablet Reported on 04/16/2015 02/01/15   Historical Provider, MD  metroNIDAZOLE (FLAGYL) 500 MG tablet Reported on 04/16/2015 02/01/15   Historical Provider, MD  oxyCODONE-acetaminophen (PERCOCET/ROXICET) 5-325 MG tablet Reported on 04/16/2015 03/17/15   Historical Provider, MD  warfarin (COUMADIN) 6 MG tablet Take 6 mg by  mouth daily. Reported on 04/16/2015 02/09/15   Historical Provider, MD     Review of Systems  Musculoskeletal: Positive for arthralgias.  Skin: Positive for wound.      Objective:   Physical Exam  Constitutional: She is oriented to person, place, and time. She appears well-developed and well-nourished. No distress.  HENT:  Head: Normocephalic.  Cardiovascular: Normal rate.   Pulmonary/Chest: Effort normal.  Neurological: She is alert and oriented to person, place, and time.  Skin:  Ankle wound right is dry without cellulitis--central eschar is golden, dry  Nursing note and vitals reviewed. has distal pulses on R-toes warm   BP 120/68 mmHg  Pulse 89  Temp(Src) 98 F (36.7 C) (Oral)  Resp 16  Ht 5' 4.25" (1.632 m)  Wt 89 lb 6.4 oz (40.552 kg)  BMI 15.23 kg/m2  SpO2 99%      Assessment & Plan:  Wound of right ankle, subsequent encounter  No changes made today Will change coumadin  monitoring back to [place] thru [facility] as before when home health stops next week Will check with Dr Leafy Ro group about plans for surgery and how they want to manage coumadin---or do they want to wait and watch wound a bit longer since slowly improving----her PA Shawn [place]

## 2015-04-21 ENCOUNTER — Telehealth: Payer: Self-pay | Admitting: Physician Assistant

## 2015-04-21 NOTE — Telephone Encounter (Signed)
"  If it's healing, great. We could wait."  Either they can bridge her with lovenox, or the coumadin could be stopped. She hasn't seen the wound in about 2 weeks.

## 2015-04-22 ENCOUNTER — Ambulatory Visit (HOSPITAL_COMMUNITY): Admission: RE | Admit: 2015-04-22 | Payer: Medicare Other | Source: Ambulatory Visit | Admitting: Plastic Surgery

## 2015-04-29 ENCOUNTER — Telehealth: Payer: Self-pay

## 2015-04-29 DIAGNOSIS — I7090 Unspecified atherosclerosis: Secondary | ICD-10-CM

## 2015-04-29 DIAGNOSIS — I708 Atherosclerosis of other arteries: Secondary | ICD-10-CM

## 2015-04-29 NOTE — Telephone Encounter (Signed)
Patient requesting a referral to a cardiologist.  Also,  Extreme pain in foot not getting treatment elsewhere.   [phone] Jerilynn Mages)

## 2015-04-29 NOTE — Telephone Encounter (Signed)
She needs to come in? DO you agree Dr. Laney Pastor.

## 2015-04-29 NOTE — Telephone Encounter (Signed)
Needs cardiac eval to see if her atherosclerosis needs further eval from their perspective--she is asking for this  Regarding foot--we are at point waiting to see if area heals or needs surgery---we can see her at any time for this if she is concerned--can refill meds if needed

## 2015-05-02 NOTE — Telephone Encounter (Signed)
Spoke with pt, advised message from Dr. Laney Pastor. Pt agreed. She states the cardiologist already called her.

## 2015-05-03 ENCOUNTER — Ambulatory Visit (INDEPENDENT_AMBULATORY_CARE_PROVIDER_SITE_OTHER): Payer: Medicare Other | Admitting: Pharmacist Clinician (PhC)/ Clinical Pharmacy Specialist

## 2015-05-03 ENCOUNTER — Ambulatory Visit (INDEPENDENT_AMBULATORY_CARE_PROVIDER_SITE_OTHER): Payer: Medicare Other | Admitting: Internal Medicine

## 2015-05-03 ENCOUNTER — Encounter: Payer: Self-pay | Admitting: Internal Medicine

## 2015-05-03 VITALS — BP 100/68 | HR 68 | Ht 65.0 in | Wt 90.7 lb

## 2015-05-03 DIAGNOSIS — Z86711 Personal history of pulmonary embolism: Secondary | ICD-10-CM

## 2015-05-03 DIAGNOSIS — E43 Unspecified severe protein-calorie malnutrition: Secondary | ICD-10-CM | POA: Diagnosis not present

## 2015-05-03 DIAGNOSIS — I70213 Atherosclerosis of native arteries of extremities with intermittent claudication, bilateral legs: Secondary | ICD-10-CM

## 2015-05-03 DIAGNOSIS — I739 Peripheral vascular disease, unspecified: Secondary | ICD-10-CM | POA: Diagnosis not present

## 2015-05-03 DIAGNOSIS — Z7901 Long term (current) use of anticoagulants: Secondary | ICD-10-CM

## 2015-05-03 LAB — POCT INR: INR: 2.8

## 2015-05-03 NOTE — Patient Instructions (Signed)
Medication Instructions:  Your physician recommends that you continue on your current medications as directed. Please refer to the Current Medication list given to you today.   Labwork: none  Testing/Procedures: none  Follow-Up: Follow up with Dr. Hilty as needed.   Any Other Special Instructions Will Be Listed Below (If Applicable).     If you need a refill on your cardiac medications before your next appointment, please call your pharmacy.   

## 2015-05-03 NOTE — Progress Notes (Signed)
OFFICE NOTE  Chief Complaint:  Establish anticoagulation follow-up, evaluate "atherosclerosis"  Primary Care Physician: [name] Koyanagi, MD  HPI:  Cynthia Bowen  Is a 70 year old female with significant peripheral arterial disease. She is required femoral-popliteal bypass grafting which was apparently infected and ultimately she had left axillary femoral bypass surgery. She is significantly underweight with protein calorie malnutrition and has been hospitalized [facility] period of time. She was apparently here for evaluation of atherosclerosis, although she denies any chest pain or shortness of breath. She says she had had a remote stress test which was an exercise test in the past. She says she's never seen a cardiologist before. When asked why she was on warfarin, there was no  Understanding. I've reviewed the chart and it does indicate there was a filling defect on CT in 2014 consistent with PE.   She probably completed a six-month course of anticoagulation for this. In addition, she had a note by her vascular surgeon Dr. Bridgett Larsson in September 2016 during a hospitalization where she was noted to have very poor collateral flow in her peripheral extremities and was felt that she was at high risk for thrombosis , therefore she was started on warfarin.  This had been managed by a provider in [place] however she says that her INRs have been up and down and that she wishes to have her warfarin followed in [place].  PMHx:  Past Medical History  Diagnosis Date  . COPD (chronic obstructive pulmonary disease) ([place])   . Pneumonia 12-2011  . GERD (gastroesophageal reflux disease)   . Headache(784.0)   . Arthritis     osteoarthritis  . Allergy   . Depression   . Neuromuscular disorder ([place])   . Osteoporosis   . Bronchitis     CURRENTLY AS OF 06/30/12 - HAS COUGH AND FINISHED ANTIBIOTIC FOR BRONCHITIS  . Fibromyalgia   . Pain     ABDOMINAL PAIN AND NAUSEA  . Pain      SOMETIMES PAIN RIGHT EAR AND NECK--STATES CAUSED BY A "LUMP" ON BACK OF EAR--USES KENALOG CREAM TOPICALLY AS NEEDED.  Marland Kitchen Gastrocutaneous fistula   . Anemia   . Anxiety   . Blood transfusion without reported diagnosis   . Heart murmur     young  . Peripheral vascular disease (HCC)     hx  ?leg  . History of kidney stones   . Pulmonary embolism [facility])     Past Surgical History  Procedure Laterality Date  . Abdominal hysterectomy    . Esophagogastroduodenoscopy  04/18/2012    Procedure: ESOPHAGOGASTRODUODENOSCOPY (EGD);  Surgeon: Inda Castle, MD;  Location: [facility];  Service: Endoscopy;  Laterality: N/A;  . Eus  05/29/2012    Procedure: UPPER ENDOSCOPIC ULTRASOUND (EUS) LINEAR;  Surgeon: Milus Banister, MD;  Location: [facility];  Service: Endoscopy;  Laterality: N/A;  . Appendectomy    . Spine surgery      CERVICAL SPINE SURGERY X 2 - INCLUDING FUSION; LUMBAR SURGERY FOR RUPTURED DISC  . Eye surgery      BILATERAL CATARACT EXTRACTIONS  . Laparoscopic abdominal exploration N/A 07/02/2012    Procedure: converted to laparotomy with gastric biopsy;  Surgeon: Imogene Burn. Georgette Dover, MD;  Location: [facility];  Service: General;  Laterality: N/A;  Laparoscopic Gastric Biospy attempted.   . Laparotomy N/A 07/07/2012    Procedure: EXPLORATORY LAPAROTOMY repair of gastric perforation with omental graham patch, drainage of abdominal abcess;  Surgeon: Imogene Burn.  Georgette Dover, MD;  Location: [facility];  Service: General;  Laterality: N/A;  . Stomach surgery  07/07/2012    Omental patch of gastric perforation  . Laparotomy N/A 07/13/2012    Procedure: EXPLORATORY LAPAROTOMY repair gastric leak;  Surgeon: Edward Jolly, MD;  Location: [facility];  Service: General;  Laterality: N/A;  . Laparotomy N/A 11/11/2012    Procedure: EXPLORATORY LAPAROTOMY;  Surgeon: Imogene Burn. Georgette Dover, MD;  Location: [place];  Service: General;  Laterality: N/A;  . Bowel resection N/A 11/11/2012    Procedure: SMALL BOWEL RESECTION;   Surgeon: Imogene Burn. Georgette Dover, MD;  Location: [place];  Service: General;  Laterality: N/A;  . Minor application of wound vac N/A 11/11/2012    Procedure: APPLICATION OF WOUND VAC;  Surgeon: Imogene Burn. Georgette Dover, MD;  Location: [place];  Service: General;  Laterality: N/A;  . Jejunostomy N/A 11/11/2012    Procedure: PLACEMENT OF FEEDING JEJUNOSTOMY TUBE;  Surgeon: Imogene Burn. Georgette Dover, MD;  Location: [place];  Service: General;  Laterality: N/A;  . Lysis of adhesion N/A 11/11/2012    Procedure: LYSIS OF ADHESION;  Surgeon: Imogene Burn. Georgette Dover, MD;  Location: [place];  Service: General;  Laterality: N/A;  . Hernia repair    . Gastric fistula repair      05/16/2013  . Colonoscopy    . Polypectomy    . Upper gastrointestinal endoscopy    . Abdominal aortagram N/A 08/19/2014    Procedure: ABDOMINAL Maxcine Ham;  Surgeon: Conrad Woodstock, MD;  Location[facility];  Service: Cardiovascular;  Laterality: N/A;  . Abdominal aortic endovascular stent graft N/A 08/26/2014    Procedure: AORTIC ENDOVASCULAR  COVERED STENT ;  Surgeon: Conrad Millfield, MD;  Location: [place];  Service: Vascular;  Laterality: N/A;  IVUS  . Femoral-femoral bypass graft Bilateral 09/04/2014    Procedure: Right External Iliac and right comon femoral endarterectomy with patching and right to left BYPASS GRAFT FEMORAL-FEMORAL ARTERY, ;  Surgeon: Rosetta Posner, MD;  Location: [place];  Service: Vascular;  Laterality: Bilateral;  . Wound exploration Left 12/30/2014    Procedure: WOUND EXPLORATION;  Surgeon: Conrad Irwin, MD;  Location: [place];  Service: Vascular;  Laterality: Left;  . Irrigation and debridement abscess Bilateral 12/30/2014    Procedure: IRRIGATION AND DEBRIDEMENT of bilateral groin ABSCESSes;  Surgeon: Conrad Lattimer, MD;  Location: [place];  Service: Vascular;  Laterality: Bilateral;  . Application of wound vac Right 12/30/2014    Procedure: APPLICATION OF WOUND VAC to right groin;  Surgeon: Conrad [place], MD;  Location: [place];  Service: Vascular;  Laterality: Right;   . Femoral-femoral bypass graft Bilateral 12/31/2014    Procedure: REMOVAL INFECTED RIGHT FEMORAL ARTERY TO LEFT FEMORAL ARTERY BYPASS GRAFT;  Surgeon: Conrad [place], MD;  Location: [place];  Service: Vascular;  Laterality: Bilateral;  . Axillary-femoral bypass graft Left 12/31/2014    Procedure: LEFT  AXILLA RY ARTERY TO LEFT FEMORAL ARTERY BYPASS GRAFT;  Surgeon: Conrad Alba, MD;  Location: [place];  Service: Vascular;  Laterality: Left;  . Patch angioplasty Right 12/31/2014    Procedure: RIGHT FEMORAL ARTERY PATCH ANGIOPLASTY USING Rueben Bash BIOLOGIC PATCH;  Surgeon: Conrad La [place], MD;  Location: [place];  Service: Vascular;  Laterality: Right;  . Intraoperative arteriogram Left 12/31/2014    Procedure: INTRA OPERATIVE ARTERIOGRAM;  Surgeon: Conrad May, MD;  Location: [place];  Service: Vascular;  Laterality: Left;  . Wound exploration Left 01/11/2015  Procedure: LEFT AXILLARY WOUND EXPLORATION AND DRAINAGE OF SEROMA;  Surgeon: Conrad Landover, MD;  Location: [place];  Service: Vascular;  Laterality: Left;  . Application of wound vac Left 01/11/2015    Procedure: APPLICATION OF NEGATIVE PRESSURE WOUND DRESSING TO LEFT AXILLA  AND LEFT GROIN;  Surgeon: Conrad [place], MD;  Location: [place];  Service: Vascular;  Laterality: Left;  . Ovarian cyst removal      FAMHx:  Family History  Problem Relation Age of Onset  . COPD Mother   . Hyperlipidemia Mother   . Hypertension Maternal Grandmother   . Stroke Maternal Grandmother   . Colon cancer Neg Hx     SOCHx:   reports that she quit smoking about 10 years ago. Her smoking use included Cigarettes. She started smoking about 12 years ago. She has a 17.5 pack-year smoking history. She has never used smokeless tobacco. She reports that she does not drink alcohol or use illicit drugs.  ALLERGIES:  Allergies  Allergen Reactions  . Avelox [Moxifloxacin Hcl In Nacl] Nausea And Vomiting  . Betadine [Povidone Iodine] Itching and Rash  . Alendronate Sodium Nausea And  Vomiting and Other (See Comments)    dizziness  . Aspirin Nausea Only  . Codeine Nausea And Vomiting  . Doxycycline Nausea And Vomiting  . Fluconazole Nausea And Vomiting  . Hydrocodone Nausea And Vomiting    GI distress  . Hydrocodone-Acetaminophen Nausea And Vomiting  . Morphine And Related Nausea Only  . Neurontin [Gabapentin] Other (See Comments)    Mood changes   . Nsaids Other (See Comments)    Severe gastritis & perforation - avoid NSAIDs when possible  . Quinolones Hives and Itching  . Sertraline Hcl Nausea And Vomiting and Other (See Comments)    Hallucinations   . Sulfamethoxazole Hives and Itching  . Latex Rash  . Sulfa Antibiotics Rash    ROS: A comprehensive review of systems was negative.  HOME MEDS: Current Outpatient Prescriptions  Medication Sig Dispense Refill  . acetaminophen (TYLENOL) 500 MG tablet Take 1,000 mg by mouth every 8 (eight) hours as needed for mild pain or moderate pain.    . cephALEXin (KEFLEX) 500 MG capsule Reported on 04/16/2015    . dicyclomine (BENTYL) 20 MG tablet Take 20 mg by mouth 4 (four) times daily -  before meals and at bedtime. Reported on 04/16/2015    . feeding supplement, RESOURCE BREEZE, (RESOURCE BREEZE) LIQD Take 1 Container by mouth 3 (three) times daily between meals.    . fluocinonide cream (LIDEX) [id number] % Apply 1 application topically 2 (two) times daily as needed. Reported on 04/06/2015    . ketotifen (ZADITOR) 0.025 % ophthalmic solution Place 1 drop into both eyes 2 (two) times daily.    Marland Kitchen LORazepam (ATIVAN) 1 MG tablet Reported on 04/16/2015    . Multiple Vitamin (MULTIVITAMIN WITH MINERALS) TABS tablet Take 1 tablet by mouth daily.    . ondansetron (ZOFRAN) 4 MG tablet Take 4 mg by mouth every 8 (eight) hours as needed for nausea or vomiting.     . pregabalin (LYRICA) 50 MG capsule Take 50-100 mg by mouth 3 (three) times daily. 1 capsule twice daily, and 2 capsules at bedtime    . Probiotic Product (PROBIOTIC DAILY  PO) Take 1 capsule by mouth daily.    . ranitidine (ZANTAC) 150 MG tablet TAKE 1 TABLET TWICE DAILY 180 tablet 0  . Simethicone (GAS-X EXTRA STRENGTH PO) Take 1 tablet by mouth 3 (  three) times daily after meals.     . vitamin C (ASCORBIC ACID) 500 MG tablet Take 500 mg by mouth daily.    . Vitamin D, Ergocalciferol, (DRISDOL) 50000 UNITS CAPS capsule Take 1 capsule (50,000 Units total) by mouth every 7 (seven) days. (Patient taking differently: Take 50,000 Units by mouth every 7 (seven) days. On Saturday) 12 capsule 3  . warfarin (COUMADIN) 1 MG tablet     . warfarin (COUMADIN) 4 MG tablet     . warfarin (COUMADIN) 5 MG tablet     . warfarin (COUMADIN) 6 MG tablet Take 6 mg by mouth daily. Reported on 04/16/2015  0  . zinc gluconate 50 MG tablet Take 50 mg by mouth daily.    Marland Kitchen amoxicillin-clavulanate (AUGMENTIN) 875-125 MG tablet Reported on 05/03/2015    . cyclobenzaprine (FLEXERIL) 5 MG tablet Reported on 05/03/2015    . enoxaparin (LOVENOX) 80 MG/0.8ML injection Reported on 05/03/2015    . HYDROmorphone (DILAUDID) 2 MG tablet Reported on 05/03/2015    . metroNIDAZOLE (FLAGYL) 500 MG tablet Reported on 05/03/2015    . oxyCODONE-acetaminophen (PERCOCET/ROXICET) 5-325 MG tablet Reported on 05/03/2015    . PROAIR HFA 108 (90 BASE) MCG/ACT inhaler Inhale 1-2 puffs into the lungs every 4 (four) hours as needed for wheezing or shortness of breath. Reported on 05/03/2015     No current facility-administered medications for this visit.    LABS/IMAGING: No results found for this or any previous visit (from the past 48 hour(s)). No results found.  WEIGHTS: Wt Readings from Last 3 Encounters:  05/03/15 90 lb 11.2 oz (41.141 kg)  04/16/15 89 lb 6.4 oz (40.552 kg)  04/08/15 90 lb (40.824 kg)    VITALS: BP 100/68 mmHg  Pulse 68  Ht 5\' 5"  (1.651 m)  Wt 90 lb 11.2 oz (41.141 kg)  BMI 15.09 kg/m2  EXAM: General appearance: alert, cachectic and no distress Neck: no carotid bruit and no JVD Lungs:  clear to auscultation bilaterally Heart: regular rate and rhythm, S1, S2 normal, no murmur, click, rub or gallop Abdomen: soft, non-tender; bowel sounds normal; no masses,  no organomegaly Extremities: extremities normal, atraumatic, no cyanosis or edema Pulses: Faint distal pulses, positive thrill and left axillary femoral graft Skin: Skin color, texture, turgor normal. No rashes or lesions Neurologic: Grossly normal Psych: Pleasant  EKG: Normal sinus rhythm at 84, RAE  ASSESSMENT: 1. PAD status post failed bifemoral bypass and repeat surgery for left axillofemoral bypass 2. Poor peripheral collateral flow on warfarin 3. History of pulmonary embolus by CT 2014  PLAN: 1.   Ms. Scragg has significant PAD but no known coronary artery disease. She denies any chest pain or worsening shortness of breath. She certainly is at increased risk for coronary disease although as she is asymptomatic at this time and manage her medically. She wishes to have her warfarin followed here. There is no guideline indication for using direct oral anticoagulant such as Xarelto or Eliquis for PAD. She'll have to remain on warfarin. We can follow her INRs in the office and they can be drawn by home health. She is intolerant to aspirin but has not been on Plavix. I be concerned about using both given the increased bleeding risk without clear indication.  Plan follow-up monthly in our Coumadin clinic. She can see me on an as-needed basis.  Pixie Casino, MD, [facility] Attending Cardiologist Edie C Hilty 05/03/2015, 11:54 AM

## 2015-05-05 ENCOUNTER — Ambulatory Visit (INDEPENDENT_AMBULATORY_CARE_PROVIDER_SITE_OTHER): Payer: Medicare Other | Admitting: Family Medicine

## 2015-05-05 ENCOUNTER — Telehealth: Payer: Self-pay

## 2015-05-05 VITALS — BP 128/76 | HR 80 | Temp 98.0°F | Resp 18 | Ht 65.0 in | Wt 89.0 lb

## 2015-05-05 DIAGNOSIS — L97912 Non-pressure chronic ulcer of unspecified part of right lower leg with fat layer exposed: Secondary | ICD-10-CM

## 2015-05-05 DIAGNOSIS — I70213 Atherosclerosis of native arteries of extremities with intermittent claudication, bilateral legs: Secondary | ICD-10-CM

## 2015-05-05 MED ORDER — MUPIROCIN 2 % EX OINT: 1.0000 "application " | TOPICAL_OINTMENT | Freq: Two times a day (BID) | CUTANEOUS | Status: AC

## 2015-05-05 NOTE — Telephone Encounter (Signed)
Patient called back in to ask Dr. Joseph Art for  pain medication for the wound on her foot. He mentioned it in the visit earlier but nothing was sent in. Please call patient [phone]

## 2015-05-05 NOTE — Progress Notes (Signed)
By signing my name below, I, Cynthia Bowen, attest that this documentation has been prepared under the direction and in the presence of Robyn Haber, MD. Electronically Signed: Moises Bowen, [place]. 05/05/2015 , 2:59 PM .  Patient was seen in room 9 .   Patient ID: Cynthia Bowen MRN: [id number], DOB: Dec 29, 1945, 70 y.o. Date of Encounter: 05/05/2015  Primary Physician: [name] Koyanagi, MD  Chief Complaint:  Chief Complaint  Patient presents with  . Ankle Pain    shooting up leg onset 1-2 weeks    HPI:  Cynthia Bowen is a 70 y.o. female who presents to [facility] complaining of ankle pain that's shooting up her leg. She fell while leaving the hospital, date of injury 02/01/2015. They tried to put a splint over the area. She states that there's plenty of pain when walking on it. She applies new dressing over the area daily. She was supposed to have surgery done 04/13/2015 but she denies hearing anything back about this.   She lives by herself with a nurse that comes to check on her twice a week.   Past Medical History  Diagnosis Date  . COPD (chronic obstructive pulmonary disease) ([place])   . Pneumonia 12-2011  . GERD (gastroesophageal reflux disease)   . Headache(784.0)   . Arthritis     osteoarthritis  . Allergy   . Depression   . Neuromuscular disorder ([place])   . Osteoporosis   . Bronchitis     CURRENTLY AS OF 06/30/12 - HAS COUGH AND FINISHED ANTIBIOTIC FOR BRONCHITIS  . Fibromyalgia   . Pain     ABDOMINAL PAIN AND NAUSEA  . Pain     SOMETIMES PAIN RIGHT EAR AND NECK--STATES CAUSED BY A "LUMP" ON BACK OF EAR--USES KENALOG CREAM TOPICALLY AS NEEDED.  Marland Kitchen Gastrocutaneous fistula   . Anemia   . Anxiety   . Bowen transfusion without reported diagnosis   . Heart murmur     young  . Peripheral vascular disease (HCC)     hx  ?leg  . History of kidney stones   . Pulmonary embolism ([place])      Home Meds: Prior to Admission medications     Medication Sig Start Date End Date Taking? Authorizing Provider  acetaminophen (TYLENOL) 500 MG tablet Take 1,000 mg by mouth every 8 (eight) hours as needed for mild pain or moderate pain.    Historical Provider, MD  amoxicillin-clavulanate (AUGMENTIN) 875-125 MG tablet Reported on 05/03/2015 03/23/15   Historical Provider, MD  cephALEXin (KEFLEX) 500 MG capsule Reported on 04/16/2015 02/01/15   Historical Provider, MD  cyclobenzaprine (FLEXERIL) 5 MG tablet Reported on 05/03/2015 02/04/15   Historical Provider, MD  dicyclomine (BENTYL) 20 MG tablet Take 20 mg by mouth 4 (four) times daily -  before meals and at bedtime. Reported on 04/16/2015    Historical Provider, MD  enoxaparin (LOVENOX) 80 MG/0.8ML injection Reported on 05/03/2015 02/08/15   Historical Provider, MD  feeding supplement, RESOURCE BREEZE, (RESOURCE BREEZE) LIQD Take 1 Container by mouth 3 (three) times daily between meals. 06/05/14   Modena Jansky, MD  fluocinonide cream (LIDEX) [id number] % Apply 1 application topically 2 (two) times daily as needed. Reported on 04/06/2015 11/07/14   Historical Provider, MD  HYDROmorphone (DILAUDID) 2 MG tablet Reported on 05/03/2015 02/01/15   Historical Provider, MD  ketotifen (ZADITOR) 0.025 % ophthalmic solution Place 1 drop into both eyes 2 (two) times daily.    Historical Provider, MD  LORazepam (ATIVAN) 1 MG tablet Reported on 04/16/2015 02/01/15   Historical Provider, MD  metroNIDAZOLE (FLAGYL) 500 MG tablet Reported on 05/03/2015 02/01/15   Historical Provider, MD  Multiple Vitamin (MULTIVITAMIN WITH MINERALS) TABS tablet Take 1 tablet by mouth daily. 01/12/15   Samantha J Rhyne, PA-C  ondansetron (ZOFRAN) 4 MG tablet Take 4 mg by mouth every 8 (eight) hours as needed for nausea or vomiting.  11/23/14   Historical Provider, MD  oxyCODONE-acetaminophen (PERCOCET/ROXICET) 5-325 MG tablet Reported on 05/03/2015 03/17/15   Historical Provider, MD  pregabalin (LYRICA) 50 MG capsule Take 50-100 mg by  mouth 3 (three) times daily. 1 capsule twice daily, and 2 capsules at bedtime    Historical Provider, MD  PROAIR HFA 108 (90 BASE) MCG/ACT inhaler Inhale 1-2 puffs into the lungs every 4 (four) hours as needed for wheezing or shortness of breath. Reported on 05/03/2015 09/13/14   Historical Provider, MD  Probiotic Product (PROBIOTIC DAILY PO) Take 1 capsule by mouth daily.    Historical Provider, MD  ranitidine (ZANTAC) 150 MG tablet TAKE 1 TABLET TWICE DAILY 11/08/14   [name] Koyanagi, MD  Simethicone (GAS-X EXTRA STRENGTH PO) Take 1 tablet by mouth 3 (three) times daily after meals.     Historical Provider, MD  vitamin C (ASCORBIC ACID) 500 MG tablet Take 500 mg by mouth daily.    Historical Provider, MD  Vitamin D, Ergocalciferol, (DRISDOL) 50000 UNITS CAPS capsule Take 1 capsule (50,000 Units total) by mouth every 7 (seven) days. Patient taking differently: Take 50,000 Units by mouth every 7 (seven) days. On Saturday 06/29/14   Robyn Haber, MD  warfarin (COUMADIN) 1 MG tablet  04/02/15   Historical Provider, MD  warfarin (COUMADIN) 4 MG tablet  03/23/15   Historical Provider, MD  warfarin (COUMADIN) 5 MG tablet  03/16/15   Historical Provider, MD  warfarin (COUMADIN) 6 MG tablet Take 6 mg by mouth daily. Reported on 04/16/2015 02/09/15   Historical Provider, MD  zinc gluconate 50 MG tablet Take 50 mg by mouth daily.    Historical Provider, MD    Allergies:  Allergies  Allergen Reactions  . Avelox [Moxifloxacin Hcl In Nacl] Nausea And Vomiting  . Betadine [Povidone Iodine] Itching and Rash  . Alendronate Sodium Nausea And Vomiting and Other (See Comments)    dizziness  . Aspirin Nausea Only  . Codeine Nausea And Vomiting  . Doxycycline Nausea And Vomiting  . Fluconazole Nausea And Vomiting  . Hydrocodone Nausea And Vomiting    GI distress  . Hydrocodone-Acetaminophen Nausea And Vomiting  . Morphine And Related Nausea Only  . Neurontin [Gabapentin] Other (See Comments)    Mood  changes   . Nsaids Other (See Comments)    Severe gastritis & perforation - avoid NSAIDs when possible  . Quinolones Hives and Itching  . Sertraline Hcl Nausea And Vomiting and Other (See Comments)    Hallucinations   . Sulfamethoxazole Hives and Itching  . Latex Rash  . Sulfa Antibiotics Rash    Social History   Social History  . Marital Status: Single    Spouse Name: N/A  . Number of Children: 0  . Years of Education: N/A   Occupational History  .     Social History Main Topics  . Smoking status: Former Smoker -- 0.50 packs/day for 35 years    Types: Cigarettes    Start date: 01/04/2003    Quit date: 04/17/2005  . Smokeless tobacco: Never Used  . Alcohol  Use: No  . Drug Use: No  . Sexual Activity: No   Other Topics Concern  . Not on file   Social History Narrative   Single. Education: [employer]. Exercise.     Review of Systems: Constitutional: negative for fever, chills, night sweats, weight changes, or fatigue  HEENT: negative for vision changes, hearing loss, congestion, rhinorrhea, ST, epistaxis, or sinus pressure Cardiovascular: negative for chest pain or palpitations Respiratory: negative for hemoptysis, wheezing, shortness of breath, or cough Abdominal: negative for abdominal pain, nausea, vomiting, diarrhea, or constipation Dermatological: negative for rash; positive for wound (right ankle) Musc: positive for arthralgia (right ankle)  Neurologic: negative for headache, dizziness, or syncope All other systems reviewed and are otherwise negative with the exception to those above and in the HPI.  Physical Exam: Bowen pressure 128/76, pulse 80, temperature 98 F (36.7 C), temperature source Oral, resp. rate 18, height 5\' 5"  (1.651 m), weight 89 lb (40.37 kg), SpO2 98 %., Body mass index is 14.81 kg/(m^2). General: Well developed, well nourished, in no acute distress. Head: Normocephalic, atraumatic, eyes without discharge, sclera non-icteric, nares are  without discharge. Bilateral auditory canals clear, TM's are without perforation, pearly grey and translucent with reflective cone of light bilaterally. Oral cavity moist, posterior pharynx without exudate, erythema, peritonsillar abscess, or post nasal drip. Neck: Supple. No thyromegaly. Full ROM. No lymphadenopathy. Lungs: Clear bilaterally to auscultation without wheezes, rales, or rhonchi. Breathing is unlabored. Heart: RRR with S1 S2. No murmurs, rubs, or gallops appreciated. Msk:  Strength and tone normal for age. Extremities/Skin: Warm and dry. 6x3cm deep leg ulcer on right just above ankle with granulation tissue with margins, exposed fashion in center Neuro: Alert and oriented X 3. Moves all extremities spontaneously. Gait is normal. CNII-XII grossly in tact. Psych:  Responds to questions appropriately with a normal affect.    ASSESSMENT AND PLAN:  70 y.o. year old female with deep pressure type ulcer on anterior lower right shin. This chart was scribed in my presence and reviewed by me personally.    ICD-9-CM ICD-10-CM   1. Leg ulcer, right, with fat layer exposed ([place]) 707.10 [id number] Ambulatory referral to Pain Clinic     mupirocin ointment (BACTROBAN) 2 %      Signed, Robyn Haber, MD 05/05/2015 2:59 PM

## 2015-05-05 NOTE — Patient Instructions (Signed)
We are arranging a consultation with the Wound Clinic at [facility].  In the meantime, change the dressing twice a day with the new antibiotic ointment, telfa covering and gauze protection

## 2015-05-06 ENCOUNTER — Encounter: Payer: Self-pay | Admitting: Vascular Surgery

## 2015-05-06 ENCOUNTER — Ambulatory Visit (INDEPENDENT_AMBULATORY_CARE_PROVIDER_SITE_OTHER): Payer: Medicare Other | Admitting: Pharmacist Clinician (PhC)/ Clinical Pharmacy Specialist

## 2015-05-06 ENCOUNTER — Telehealth: Payer: Self-pay | Admitting: Internal Medicine

## 2015-05-06 DIAGNOSIS — I739 Peripheral vascular disease, unspecified: Secondary | ICD-10-CM

## 2015-05-06 DIAGNOSIS — Z7901 Long term (current) use of anticoagulants: Secondary | ICD-10-CM

## 2015-05-06 LAB — POCT INR: INR: 2.7

## 2015-05-06 NOTE — Telephone Encounter (Signed)
See anticoag note

## 2015-05-06 NOTE — Telephone Encounter (Signed)
INR 2.7 PT 32.2   Janilah needs to know when to recheck. Call her at [phone]

## 2015-05-13 ENCOUNTER — Encounter: Payer: Self-pay | Admitting: Vascular Surgery

## 2015-05-13 ENCOUNTER — Telehealth: Payer: Self-pay | Admitting: Internal Medicine

## 2015-05-13 ENCOUNTER — Ambulatory Visit (INDEPENDENT_AMBULATORY_CARE_PROVIDER_SITE_OTHER): Payer: Medicare Other | Admitting: Pharmacist Clinician (PhC)/ Clinical Pharmacy Specialist

## 2015-05-13 ENCOUNTER — Ambulatory Visit (INDEPENDENT_AMBULATORY_CARE_PROVIDER_SITE_OTHER): Payer: Medicare Other | Admitting: Vascular Surgery

## 2015-05-13 VITALS — BP 112/71 | HR 119 | Ht 65.0 in | Wt 89.4 lb

## 2015-05-13 DIAGNOSIS — Z7901 Long term (current) use of anticoagulants: Secondary | ICD-10-CM

## 2015-05-13 DIAGNOSIS — I70213 Atherosclerosis of native arteries of extremities with intermittent claudication, bilateral legs: Secondary | ICD-10-CM

## 2015-05-13 DIAGNOSIS — I739 Peripheral vascular disease, unspecified: Secondary | ICD-10-CM

## 2015-05-13 LAB — POCT INR: INR: 3

## 2015-05-13 MED ORDER — OXYCODONE-ACETAMINOPHEN 5-325 MG PO TABS: 1.0000 | ORAL_TABLET | ORAL | Status: DC | PRN

## 2015-05-13 NOTE — Progress Notes (Signed)
    Postoperative Visit   History of Present Illness  Cynthia Bowen is a 70 y.o. (1946-02-21) female  who presents for postoperative follow-up for: 1. Excision of infected fem-fem BPG, L Ax-PFA bypass (Date: 12/31/14). 2. Left axillary incision exploration and drainage of seroma (01/11/15).  All surgical incisions are healed. The patient did not have her ulcer debridement due to therapeutic INR on the day of the procedure.  She has not been rescheduled.  Pt still has neuropathic sx in the L thigh.   For VQI Use Only  PRE-ADM LIVING: Home  AMB STATUS: Ambulatory with Assistance   Physical Examination  Filed Vitals:   05/13/15 1042 05/13/15 1044  BP: 114/110 112/71  Pulse: 119   Height: 5\' 5"  (1.651 m)   Weight: 89 lb 6.4 oz (40.552 kg)   SpO2: 97%    R groin: healed L groin: healed, palpable pulse in L ax-PFA bypass L chest: healed L ax exposure: healed L ax-PFA route: healed L foot: warm, viable foot with intact motor and sensation R foot: viable rim of tissue with exposed tendon in middle of ulcer, TTP around the ulcer, no obvious cellulitis  Medical Decision Making  Cynthia Bowen is a 70 y.o. ([birth year]) female  who presents s/p Aortic stenting, s/p exc infected fem-fem BPG, L ax-PFA bypass, Ax seroma drainage, chronic malnutrition, depression sx, R pre-tibial full thickness skin necrosis .  Still awaiting outcomes of surgical debridement.   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  An PFA exposure might be possible but again technically very difficult.    The patient refuses to consider amputation at this point.  My office has contact Dr. Sela Hua office  to try to get this patient rescheduled.  I gave this patient another rx for Percocet 5/325 mg 1 po q4 hr prin severe pain #30 no refill as a bridge to further intervention.  Adele Barthel, MD [facility] Office: [phone] Pager: [phone]  04/08/2015, 11:24 AM

## 2015-05-13 NOTE — Telephone Encounter (Signed)
She takes warfarin for PAD and previous PE.  Are we okay to hold for 5 days for surgery (wound debridement I think) and restart same day or next day?  She doesn't fit into our standard protocol.

## 2015-05-13 NOTE — Telephone Encounter (Signed)
Calling in a PT/INR .Marland Kitchen

## 2015-05-13 NOTE — Telephone Encounter (Signed)
Cynthia Bowen is calling

## 2015-05-13 NOTE — Telephone Encounter (Signed)
See anticoag note

## 2015-05-13 NOTE — Telephone Encounter (Signed)
Spoke with kim, surgery coordinator for dr Migdalia Dk, they are wanting to get the pt back on the schedule for surgery and they want to know the plan in regards to her warfarin pre and post op. Will forward to kristin, pharm md

## 2015-05-16 NOTE — Telephone Encounter (Signed)
Kristin/Debra-  Ok to hold warfarin for 5 days - but given history of PE and significant peripheral grafting to warrant anticoagulation by vascular surgery, I think we should bridge her with lovenox. Will check with Dr. Bridgett Larsson on this. Can you arrange?  Dr. Debara Pickett

## 2015-05-20 ENCOUNTER — Ambulatory Visit (INDEPENDENT_AMBULATORY_CARE_PROVIDER_SITE_OTHER): Payer: Medicare Other | Admitting: Pharmacist Clinician (PhC)/ Clinical Pharmacy Specialist

## 2015-05-20 ENCOUNTER — Encounter: Payer: Self-pay | Admitting: Internal Medicine

## 2015-05-20 ENCOUNTER — Ambulatory Visit (INDEPENDENT_AMBULATORY_CARE_PROVIDER_SITE_OTHER): Payer: Medicare Other | Admitting: Internal Medicine

## 2015-05-20 VITALS — BP 158/79 | HR 91 | Temp 99.1°F | Resp 16 | Ht 64.25 in | Wt 87.2 lb

## 2015-05-20 DIAGNOSIS — R109 Unspecified abdominal pain: Secondary | ICD-10-CM

## 2015-05-20 DIAGNOSIS — Z7901 Long term (current) use of anticoagulants: Secondary | ICD-10-CM

## 2015-05-20 DIAGNOSIS — R634 Abnormal weight loss: Secondary | ICD-10-CM

## 2015-05-20 DIAGNOSIS — F418 Other specified anxiety disorders: Secondary | ICD-10-CM

## 2015-05-20 DIAGNOSIS — Z1159 Encounter for screening for other viral diseases: Secondary | ICD-10-CM

## 2015-05-20 DIAGNOSIS — R636 Underweight: Secondary | ICD-10-CM | POA: Diagnosis not present

## 2015-05-20 DIAGNOSIS — I70213 Atherosclerosis of native arteries of extremities with intermittent claudication, bilateral legs: Secondary | ICD-10-CM | POA: Diagnosis not present

## 2015-05-20 DIAGNOSIS — I739 Peripheral vascular disease, unspecified: Secondary | ICD-10-CM

## 2015-05-20 LAB — COMPREHENSIVE METABOLIC PANEL
ALBUMIN: 3.8 g/dL (ref 3.6–5.1)
ALK PHOS: 110 U/L (ref 33–130)
ALT: 22 U/L (ref 6–29)
AST: 28 U/L (ref 10–35)
BILIRUBIN TOTAL: 0.3 mg/dL (ref 0.2–1.2)
BUN: 13 mg/dL (ref 7–25)
CALCIUM: 9.5 mg/dL (ref 8.6–10.4)
CO2: 28 mmol/L (ref 20–31)
CREATININE: 0.7 mg/dL (ref 0.50–0.99)
Chloride: 104 mmol/L (ref 98–110)
Glucose, Bld: 80 mg/dL (ref 65–99)
Potassium: 3.9 mmol/L (ref 3.5–5.3)
SODIUM: 137 mmol/L (ref 135–146)
TOTAL PROTEIN: 7.5 g/dL (ref 6.1–8.1)

## 2015-05-20 LAB — T4, FREE: FREE T4: 1.06 ng/dL (ref 0.80–1.80)

## 2015-05-20 LAB — POCT INR: INR: 4.9

## 2015-05-20 NOTE — Telephone Encounter (Signed)
Spoke with [facility] RN and patient, they understand that we need 7-10 days notice to plan lovenox bridge.

## 2015-05-20 NOTE — Progress Notes (Signed)
Subjective:    Patient ID: Cynthia Bowen, female    DOB: 07-19-1945, 70 y.o.   MRN: [id number] By signing my name below, I, Zola Button, attest that this documentation has been prepared under the direction and in the presence of [name], MD.  Electronically Signed: Zola Button, Medical Scribe. 05/20/2015. 1:12 PM.  HPI HPI Comments: Cynthia Bowen is a 70 y.o. female who presents to the [facility] for a follow-up.  Patient has a deep pressure type ulcer on anterior lower right shin which has not healed yet. She still has pain to the area.  She states her stomach has been doing fine. She is doing well with her current medications. Patient sometimes wakes up with nausea, which she believes is due to what she eats. She is trying to gain weight and has been eating more sweets to try to gain weight. She sometimes drinks Boost Breeze.  Patient notes that her handicap card will run out next week.  Review of Systems     Objective:   Physical Exam  Constitutional: She is oriented to person, place, and time. She appears well-developed and well-nourished. No distress.  HENT:  Head: Normocephalic and atraumatic.  Mouth/Throat: Oropharynx is clear and moist. No oropharyngeal exudate.  Eyes: Pupils are equal, round, and reactive to light.  Neck: Neck supple.  Cardiovascular: Normal rate.   Pulmonary/Chest: Effort normal.  Musculoskeletal: She exhibits no edema.  Neurological: She is alert and oriented to person, place, and time. No cranial nerve deficit.  Skin: Skin is warm and dry. No rash noted.  Psychiatric: She has a normal mood and affect. Her behavior is normal.  Nursing note and vitals reviewed.         Assessment & Plan:  Recurrent abdominal pain--multiple causes, status post GI surgery with multiple complications [id number] --- Stable  Depression with anxiety --- Stable  Underweight - Plan: CBC with Differential/Platelet, Comprehensive metabolic  panel --- Has not responded to attempts to include increased caloric intake  Need for hepatitis C screening test - Plan: Hepatitis C antibody  Abnormal weight loss - Plan: T4, free  She is here for repeat labs to prove there is no persistent  hypokalemia and that she is recovered from her anemia  I have completed the patient encounter in its entirety as documented by the scribe, with editing by me where necessary. Chalsea Darko P. Laney Pastor, M.D.  Addendum labs Results for orders placed or performed in visit on 05/20/15  CBC with Differential/Platelet  Result Value Ref Range   WBC 7.9 4.0 - 10.5 K/uL   RBC 4.87 3.87 - 5.11 MIL/uL   Hemoglobin 12.9 12.0 - 15.0 g/dL   HCT 39.4 36.0 - 46.0 %   MCV 80.9 78.0 - 100.0 fL   MCH 26.5 26.0 - 34.0 pg   MCHC 32.7 30.0 - 36.0 g/dL   RDW 24.8 (H) 11.5 - 15.5 %   Platelets 214 150 - 400 K/uL   MPV Not Performed 8.6 - 12.4 fL   Neutrophils Relative % 61 43 - 77 %   Neutro Abs 4.8 1.7 - 7.7 K/uL   Lymphocytes Relative 30 12 - 46 %   Lymphs Abs 2.4 0.7 - 4.0 K/uL   Monocytes Relative 9 3 - 12 %   Monocytes Absolute 0.7 0.1 - 1.0 K/uL   Eosinophils Relative 0 0 - 5 %   Eosinophils Absolute 0.0 0.0 - 0.7 K/uL   Basophils Relative 0 0 -  1 %   Basophils Absolute 0.0 0.0 - 0.1 K/uL   Smear Review SEE NOTE   Comprehensive metabolic panel  Result Value Ref Range   Sodium 137 135 - 146 mmol/L   Potassium 3.9 3.5 - 5.3 mmol/L   Chloride 104 98 - 110 mmol/L   CO2 28 20 - 31 mmol/L   Glucose, Bld 80 65 - 99 mg/dL   BUN 13 7 - 25 mg/dL   Creat 0.70 0.50 - 0.99 mg/dL   Total Bilirubin 0.3 0.2 - 1.2 mg/dL   Alkaline Phosphatase 110 33 - 130 U/L   AST 28 10 - 35 U/L   ALT 22 6 - 29 U/L   Total Protein 7.5 6.1 - 8.1 g/dL   Albumin 3.8 3.6 - 5.1 g/dL   Calcium 9.5 8.6 - 10.4 mg/dL  Hepatitis C antibody  Result Value Ref Range   HCV Ab NEGATIVE NEGATIVE  T4, free  Result Value Ref Range   Free T4 1.06 0.80 - 1.80 ng/dL   She will follow-up with  plastic surgery regarding wound care which is significantly painful at this point She will follow-up with Dr.Chen regarding her vascular status Follow-up here one to 2 months

## 2015-05-21 ENCOUNTER — Other Ambulatory Visit: Payer: Self-pay | Admitting: Internal Medicine

## 2015-05-21 LAB — CBC WITH DIFFERENTIAL/PLATELET
BASOS PCT: 0 % (ref 0–1)
Basophils Absolute: 0 10*3/uL (ref 0.0–0.1)
Eosinophils Absolute: 0 10*3/uL (ref 0.0–0.7)
Eosinophils Relative: 0 % (ref 0–5)
HEMATOCRIT: 39.4 % (ref 36.0–46.0)
HEMOGLOBIN: 12.9 g/dL (ref 12.0–15.0)
LYMPHS PCT: 30 % (ref 12–46)
Lymphs Abs: 2.4 10*3/uL (ref 0.7–4.0)
MCH: 26.5 pg (ref 26.0–34.0)
MCHC: 32.7 g/dL (ref 30.0–36.0)
MCV: 80.9 fL (ref 78.0–100.0)
Monocytes Absolute: 0.7 10*3/uL (ref 0.1–1.0)
Monocytes Relative: 9 % (ref 3–12)
NEUTROS ABS: 4.8 10*3/uL (ref 1.7–7.7)
NEUTROS PCT: 61 % (ref 43–77)
Platelets: 214 10*3/uL (ref 150–400)
RBC: 4.87 MIL/uL (ref 3.87–5.11)
RDW: 24.8 % — ABNORMAL HIGH (ref 11.5–15.5)
WBC: 7.9 10*3/uL (ref 4.0–10.5)

## 2015-05-21 LAB — HEPATITIS C ANTIBODY: HCV AB: NEGATIVE

## 2015-05-24 NOTE — Telephone Encounter (Signed)
Patient was just seen is she continuing her miralax

## 2015-05-24 NOTE — Telephone Encounter (Signed)
Is patient continuing Miralax?

## 2015-05-27 ENCOUNTER — Telehealth: Payer: Self-pay | Admitting: Internal Medicine

## 2015-05-27 ENCOUNTER — Ambulatory Visit (INDEPENDENT_AMBULATORY_CARE_PROVIDER_SITE_OTHER): Payer: Medicare Other | Admitting: Pharmacist Clinician (PhC)/ Clinical Pharmacy Specialist

## 2015-05-27 DIAGNOSIS — I739 Peripheral vascular disease, unspecified: Secondary | ICD-10-CM

## 2015-05-27 DIAGNOSIS — Z7901 Long term (current) use of anticoagulants: Secondary | ICD-10-CM

## 2015-05-27 LAB — POCT INR: INR: 2.4

## 2015-05-27 NOTE — Telephone Encounter (Signed)
New Message  Cynthia Bowen was calling to give INR results

## 2015-05-27 NOTE — Telephone Encounter (Signed)
Left message to call.

## 2015-05-27 NOTE — Addendum Note (Signed)
Addended by: Diana Eves on: 05/27/2015 02:49 PM   Modules accepted: Orders

## 2015-05-27 NOTE — Telephone Encounter (Signed)
Cynthia Bowen is calling about some medications that they were asking about for [name]. Please call  Thanks

## 2015-06-01 ENCOUNTER — Telehealth: Payer: Self-pay | Admitting: Internal Medicine

## 2015-06-01 ENCOUNTER — Telehealth: Payer: Self-pay | Admitting: Vascular Surgery

## 2015-06-01 NOTE — Telephone Encounter (Signed)
Returned patient's call and let her know Kristin's recommendations & that I have been in communication w/ Maudie Mercury at Dr. Eusebio Friendly office. Advised to call them to see when proc can be scheduled. Pt states understanding.

## 2015-06-01 NOTE — Telephone Encounter (Signed)
[name] tentatively setting pt up for March appt. Will call back once scheduled.

## 2015-06-01 NOTE — Telephone Encounter (Signed)
Returned call and left message w/ instructions for [name] at Dr. Marla Roe [facility]) office.

## 2015-06-01 NOTE — Telephone Encounter (Signed)
Follow up     Patient calling back to speak with nurse regarding surgery

## 2015-06-01 NOTE — Telephone Encounter (Signed)
Returned [name]'s call. Dr. Marla Roe is trying to reschedule a procedure for a right ankle debridement & possible A-cell placement. This was originally scheduled for Dec 21st but anasthesia cancelled d/t pt having abnormal labs.  Since then they have been trying to reschedule but need a plan in place for the patient's coumadin, bridging if required.  I did review chart during discussion and brought it to [name]'s attention that as I understand it, Dr. Bridgett Larsson is also trying to arrange a vascular procedure for patient. She stated this would probably take priority. Regardless, would like to know what to do w/ coumadin.  Maudie Mercury is aware I have spoken w/ Erasmo Downer about this and will send to her to address.

## 2015-06-01 NOTE — Telephone Encounter (Signed)
Follow Up   Still waiting to discuss Coumadin. Pt is rather upset. She states that it has been over a week and she has not heard back from a nurse please assist

## 2015-06-01 NOTE — Telephone Encounter (Signed)
Left message to call. Call goes to VM for surgical coordinator. Left message advising that if surgical clearance is needed, to please leave info with operator.  Calls last week went unreturned.

## 2015-06-01 NOTE — Telephone Encounter (Signed)
Cynthia Bowen called our triage nurse regarding her referral to Cynthia Marla Roe with [facility]. She stated that she had not heard back from them since December 21st. She was told that they needed more information from our Bowen.  I placed a call to [place] and spoke with Cynthia Bowen. Per Cynthia Bowen, Cynthia Bowen was scheduled for surgery on 04/13/2015. Her surgery was canceled by anesthesia due to anticoagulation questions. Cynthia Bowen stated that Cynthia Bowen could not recall who managed her coumadin. Their Bowen contacted her PCP who directed them to Cynthia Debara Pickett. Cynthia Bowen at [facility] Bowen is working with Cynthia Bowen and the RN at [employer] to come up with a bridge for her blood thinner prior to surgery. Cynthia Bowen will contact the patients sister to see if she can assist with Cynthia Bowen understanding, as they have spoken with Cynthia Bowen multiple times about this situation AND Cynthia Bowen has called them several times as well to discuss. Once they have the instructions and OK from Cynthia Bowen, they will reschedule her surgical appointment.  When I spoke with Cynthia Bowen to inform of the information above, she stated that no one had called her and that she wasn't sure she wanted to "fool with or waste her time" with the [place] [facility]en. She then went on to say that Cynthia Bowen Bowen said they are waiting to hear back from Cynthia Bowen regarding her surgical date before they can take her off of the blood thinner. I advised Cynthia Bowen to contact Cynthia Bowen to discuss, as they are already in contact with Cynthia Debara Pickett. Cynthia Bowen stated she may decline the appointment, as she is not going to "waste her time".

## 2015-06-01 NOTE — Telephone Encounter (Signed)
Follow up      Returning a call to the nurse.  She would not leave a message with the operator.  She said she needed to talk to the nurse.  Please call after 10am

## 2015-06-01 NOTE — Telephone Encounter (Signed)
Reviewed with Dr. Debara Pickett - see telephone encounter of 05-13-15.  Patient is high risk for future clots, will need to be bridged with lovenox when warfarin is held.  Patient has [place] seeing her now, so we can work with them to be sure she is appropriately bridged.  Just need a date.  Please give [place] at least 7 days notice so that we can arrange for [facility] to have everything in place.

## 2015-06-03 ENCOUNTER — Telehealth: Payer: Self-pay | Admitting: Internal Medicine

## 2015-06-03 ENCOUNTER — Ambulatory Visit (INDEPENDENT_AMBULATORY_CARE_PROVIDER_SITE_OTHER): Payer: Medicare Other | Admitting: Pharmacist Clinician (PhC)/ Clinical Pharmacy Specialist

## 2015-06-03 DIAGNOSIS — I739 Peripheral vascular disease, unspecified: Secondary | ICD-10-CM

## 2015-06-03 DIAGNOSIS — Z7901 Long term (current) use of anticoagulants: Secondary | ICD-10-CM

## 2015-06-03 LAB — POCT INR: INR: 2.3

## 2015-06-03 MED ORDER — ENOXAPARIN SODIUM 40 MG/0.4ML ~~LOC~~ SOLN: 40.0000 mg | Freq: Two times a day (BID) | SUBCUTANEOUS | Status: DC

## 2015-06-03 NOTE — Telephone Encounter (Signed)
Faxed signed ordered r/t PT/INR to [facility]

## 2015-06-03 NOTE — Telephone Encounter (Signed)
Follow Up   Pt returned called

## 2015-06-03 NOTE — Telephone Encounter (Signed)
See anticoag note.  Pt aware of lovenox bridge

## 2015-06-03 NOTE — Telephone Encounter (Signed)
New message      Calling to talk to [facility]

## 2015-06-03 NOTE — Telephone Encounter (Signed)
[name] calling back about bridging coumadin for pt's procedure (see recent telephone notes). i left her msg to call back - gave recommendation to get in touch w/ Erasmo Downer about this. Will put to her attn.

## 2015-06-03 NOTE — Patient Instructions (Signed)
Enoxaprin Dosing Schedule  Enoxparin dose:  Date  Warfarin Dose (evenings) Enoxaprin Dose  2-09 6 5 mg (yesterday)   2-10 5 0   2-11 4 0            8 pm  2-12 3 0 8 am   8 pm  2-13 2 0 8 am   8 pm  2-14 1 0 8 am  2-15 Procedure 0   2-16 1 5  mg (1 tab) 8 am   8 pm  2-17 2 5  mg (1 tab) 8 am   8 pm  2-18 3 5  mg (1 tab) 8 am   8 pm  2-19 4 5  mg (1 tab) 8 am   8 pm  2-20 5 Repeat INR 8 am   8 pm  2-21 6

## 2015-06-03 NOTE — Telephone Encounter (Signed)
LMOM for Cynthia Bowen to call back, we just need date for procedure.  Advised we like 7-10 days in order to arrange bridging

## 2015-06-06 ENCOUNTER — Emergency Department (HOSPITAL_COMMUNITY)
Admission: EM | Admit: 2015-06-06 | Discharge: 2015-06-06 | Disposition: A | Discharge status: Home / Self Care | Payer: Medicare Other | Source: Home / Self Care

## 2015-06-06 ENCOUNTER — Telehealth: Payer: Self-pay | Admitting: Internal Medicine

## 2015-06-06 ENCOUNTER — Ambulatory Visit (INDEPENDENT_AMBULATORY_CARE_PROVIDER_SITE_OTHER): Payer: Medicare Other | Admitting: Family Medicine

## 2015-06-06 ENCOUNTER — Encounter (HOSPITAL_COMMUNITY): Payer: Self-pay | Admitting: *Deleted

## 2015-06-06 VITALS — BP 138/88 | HR 67 | Temp 98.5°F | Resp 17 | Ht 65.0 in | Wt 85.0 lb

## 2015-06-06 DIAGNOSIS — E43 Unspecified severe protein-calorie malnutrition: Secondary | ICD-10-CM | POA: Diagnosis present

## 2015-06-06 DIAGNOSIS — I739 Peripheral vascular disease, unspecified: Secondary | ICD-10-CM | POA: Diagnosis present

## 2015-06-06 DIAGNOSIS — R011 Cardiac murmur, unspecified: Secondary | ICD-10-CM

## 2015-06-06 DIAGNOSIS — M4327 Fusion of spine, lumbosacral region: Secondary | ICD-10-CM | POA: Diagnosis present

## 2015-06-06 DIAGNOSIS — N281 Cyst of kidney, acquired: Secondary | ICD-10-CM | POA: Diagnosis present

## 2015-06-06 DIAGNOSIS — R112 Nausea with vomiting, unspecified: Secondary | ICD-10-CM

## 2015-06-06 DIAGNOSIS — J449 Chronic obstructive pulmonary disease, unspecified: Secondary | ICD-10-CM | POA: Insufficient documentation

## 2015-06-06 DIAGNOSIS — Z903 Acquired absence of stomach [part of]: Secondary | ICD-10-CM

## 2015-06-06 DIAGNOSIS — R109 Unspecified abdominal pain: Secondary | ICD-10-CM

## 2015-06-06 DIAGNOSIS — K561 Intussusception: Principal | ICD-10-CM | POA: Diagnosis present

## 2015-06-06 DIAGNOSIS — K255 Chronic or unspecified gastric ulcer with perforation: Secondary | ICD-10-CM | POA: Diagnosis present

## 2015-06-06 DIAGNOSIS — R928 Other abnormal and inconclusive findings on diagnostic imaging of breast: Secondary | ICD-10-CM | POA: Diagnosis present

## 2015-06-06 DIAGNOSIS — Z86711 Personal history of pulmonary embolism: Secondary | ICD-10-CM

## 2015-06-06 DIAGNOSIS — E870 Hyperosmolality and hypernatremia: Secondary | ICD-10-CM | POA: Diagnosis not present

## 2015-06-06 DIAGNOSIS — S81801A Unspecified open wound, right lower leg, initial encounter: Secondary | ICD-10-CM | POA: Diagnosis present

## 2015-06-06 DIAGNOSIS — R1110 Vomiting, unspecified: Secondary | ICD-10-CM

## 2015-06-06 DIAGNOSIS — Z87891 Personal history of nicotine dependence: Secondary | ICD-10-CM

## 2015-06-06 DIAGNOSIS — N2889 Other specified disorders of kidney and ureter: Secondary | ICD-10-CM | POA: Diagnosis present

## 2015-06-06 DIAGNOSIS — Z7901 Long term (current) use of anticoagulants: Secondary | ICD-10-CM

## 2015-06-06 DIAGNOSIS — E876 Hypokalemia: Secondary | ICD-10-CM | POA: Diagnosis present

## 2015-06-06 DIAGNOSIS — J441 Chronic obstructive pulmonary disease with (acute) exacerbation: Secondary | ICD-10-CM | POA: Diagnosis present

## 2015-06-06 DIAGNOSIS — M810 Age-related osteoporosis without current pathological fracture: Secondary | ICD-10-CM | POA: Diagnosis present

## 2015-06-06 DIAGNOSIS — Z825 Family history of asthma and other chronic lower respiratory diseases: Secondary | ICD-10-CM

## 2015-06-06 DIAGNOSIS — I70213 Atherosclerosis of native arteries of extremities with intermittent claudication, bilateral legs: Secondary | ICD-10-CM

## 2015-06-06 DIAGNOSIS — Z79899 Other long term (current) drug therapy: Secondary | ICD-10-CM

## 2015-06-06 DIAGNOSIS — I10 Essential (primary) hypertension: Secondary | ICD-10-CM | POA: Diagnosis present

## 2015-06-06 DIAGNOSIS — G8929 Other chronic pain: Secondary | ICD-10-CM | POA: Diagnosis present

## 2015-06-06 DIAGNOSIS — Z681 Body mass index (BMI) 19 or less, adult: Secondary | ICD-10-CM

## 2015-06-06 DIAGNOSIS — Z8711 Personal history of peptic ulcer disease: Secondary | ICD-10-CM

## 2015-06-06 DIAGNOSIS — K219 Gastro-esophageal reflux disease without esophagitis: Secondary | ICD-10-CM | POA: Diagnosis present

## 2015-06-06 DIAGNOSIS — M797 Fibromyalgia: Secondary | ICD-10-CM | POA: Diagnosis present

## 2015-06-06 LAB — URINALYSIS, ROUTINE W REFLEX MICROSCOPIC
BILIRUBIN URINE: NEGATIVE
Glucose, UA: NEGATIVE mg/dL
Ketones, ur: NEGATIVE mg/dL
NITRITE: NEGATIVE
PROTEIN: NEGATIVE mg/dL
Specific Gravity, Urine: 1.013 (ref 1.005–1.030)
pH: 6 (ref 5.0–8.0)

## 2015-06-06 LAB — POCT URINALYSIS DIP (MANUAL ENTRY)
Glucose, UA: NEGATIVE
Nitrite, UA: NEGATIVE
Protein Ur, POC: >=300 — AB
Spec Grav, UA: 1.015
Urobilinogen, UA: 0.2
pH, UA: 5.5

## 2015-06-06 LAB — COMPREHENSIVE METABOLIC PANEL
ALK PHOS: 93 U/L (ref 38–126)
ALT: 20 U/L (ref 14–54)
AST: 26 U/L (ref 15–41)
Albumin: 3.6 g/dL (ref 3.5–5.0)
Anion gap: 8 (ref 5–15)
BILIRUBIN TOTAL: 0.5 mg/dL (ref 0.3–1.2)
BUN: 19 mg/dL (ref 6–20)
CALCIUM: 8.7 mg/dL — AB (ref 8.9–10.3)
CO2: 26 mmol/L (ref 22–32)
CREATININE: 0.69 mg/dL (ref 0.44–1.00)
Chloride: 107 mmol/L (ref 101–111)
GFR calc non Af Amer: >60 mL/min (ref >60)
GLUCOSE: 115 mg/dL — AB (ref 65–99)
Potassium: 3.3 mmol/L — ABNORMAL LOW (ref 3.5–5.1)
SODIUM: 141 mmol/L (ref 135–145)
TOTAL PROTEIN: 7.4 g/dL (ref 6.5–8.1)

## 2015-06-06 LAB — POCT CBC
Granulocyte percent: 68 %G (ref 37–80)
HCT, POC: 43.3 % (ref 37.7–47.9)
Hemoglobin: 14.5 g/dL (ref 12.2–16.2)
Lymph, poc: 2.5 (ref 0.6–3.4)
MCH, POC: 27.9 pg (ref 27–31.2)
MCHC: 33.5 g/dL (ref 31.8–35.4)
MCV: 83.2 fL (ref 80–97)
MID (cbc): 2.4 — AB (ref 0–0.9)
MPV: 8.2 fL (ref 0–99.8)
POC Granulocyte: 10.3 — AB (ref 2–6.9)
POC LYMPH PERCENT: 16.3 %L (ref 10–50)
POC MID %: 15.7 %M — AB (ref 0–12)
Platelet Count, POC: 321 10*3/uL (ref 142–424)
RBC: 5.2 M/uL (ref 4.04–5.48)
RDW, POC: 24.1 %
WBC: 15.2 10*3/uL — AB (ref 4.6–10.2)

## 2015-06-06 LAB — COMPLETE METABOLIC PANEL WITH GFR
ALT: 21 U/L (ref 6–29)
AST: 29 U/L (ref 10–35)
Albumin: 4 g/dL (ref 3.6–5.1)
Alkaline Phosphatase: 97 U/L (ref 33–130)
BUN: 18 mg/dL (ref 7–25)
CO2: 27 mmol/L (ref 20–31)
Calcium: 9.7 mg/dL (ref 8.6–10.4)
Chloride: 104 mmol/L (ref 98–110)
Creat: 0.66 mg/dL (ref 0.50–0.99)
GFR, Est African American: >89 mL/min (ref >=60)
GFR, Est Non African American: >89 mL/min (ref >=60)
Glucose, Bld: 113 mg/dL — ABNORMAL HIGH (ref 65–99)
Potassium: 3.8 mmol/L (ref 3.5–5.3)
Sodium: 141 mmol/L (ref 135–146)
Total Bilirubin: 0.4 mg/dL (ref 0.2–1.2)
Total Protein: 7.7 g/dL (ref 6.1–8.1)

## 2015-06-06 LAB — CBC
HCT: 38.6 % (ref 36.0–46.0)
Hemoglobin: 13 g/dL (ref 12.0–15.0)
MCH: 28.1 pg (ref 26.0–34.0)
MCHC: 33.7 g/dL (ref 30.0–36.0)
MCV: 83.4 fL (ref 78.0–100.0)
PLATELETS: 247 10*3/uL (ref 150–400)
RBC: 4.63 MIL/uL (ref 3.87–5.11)
RDW: 22 % — ABNORMAL HIGH (ref 11.5–15.5)
WBC: 15.1 10*3/uL — ABNORMAL HIGH (ref 4.0–10.5)

## 2015-06-06 LAB — POC MICROSCOPIC URINALYSIS (UMFC)
Hyaline Cast: POSITIVE
Mucus: ABSENT

## 2015-06-06 LAB — URINE MICROSCOPIC-ADD ON

## 2015-06-06 LAB — LIPASE, BLOOD: Lipase: 17 U/L (ref 11–51)

## 2015-06-06 MED ORDER — ONDANSETRON HCL 4 MG/2ML IJ SOLN: 2.0000 mg | Freq: Once | INTRAMUSCULAR | Status: DC

## 2015-06-06 NOTE — ED Notes (Signed)
Pt called several times without answer.

## 2015-06-06 NOTE — ED Notes (Signed)
Pt called, for room, no answer

## 2015-06-06 NOTE — ED Notes (Addendum)
Called for triage x3 with no response 

## 2015-06-06 NOTE — Telephone Encounter (Signed)
Faxed signed orders r/t PT/INR to [facility]

## 2015-06-06 NOTE — Progress Notes (Addendum)
Subjective:    Patient ID: Cynthia Bowen, female    DOB: 02-24-1946, 70 y.o.   MRN: [id number] By signing my name below, I, Zola Button, attest that this documentation has been prepared under the direction and in the presence of Robyn Haber, MD.  Electronically Signed: Zola Button, Medical Scribe. 06/06/2015. 1:49 PM.  HPI HPI Comments: Cynthia Bowen is a 70 y.o. female with a history of gastric perforation with abscess/peritonitis s/p omental patch repair x 2 (March 2014), GERD, and enteritis due to Clostridium difficile who presents to the [facility] complaining of abdominal pain that started yesterday. Patient has had associated vomiting and burping. She has had diff She has stopped the warfarin and is currently taking Lovenox. Patient denies diarrhea.   ftid belching and vomitus which is brown and foul-smelling   patient has a long history of atherosclerotic disease which has been surgically approached in the past.  Wt Readings from Last 3 Encounters:  06/06/15 85 lb (38.556 kg)  05/20/15 87 lb 3.2 oz (39.554 kg)  05/13/15 89 lb 6.4 oz (40.552 kg)     Review of Systems  Gastrointestinal: Positive for vomiting and abdominal pain. Negative for diarrhea.       Objective:   Physical Exam  Appears chronically ill and walks bent over holding abdomen in pain CONSTITUTIONAL:  Cachectic elderly woman in moderate distress HEAD: Normocephalic/atraumatic  EYES: EOM/PERRL; no icterus ENMT: Mucous membranes dry NECK: supple no meningeal signs SPINE: entire spine nontender CV: S1/S2 noted, no murmurs/rubs/gallops noted; regular rhythm with tachycardia LUNGS: Lungs are clear to auscultation bilaterally, no apparent distress ABDOMEN: multiple healed surgical scars in midline, soft with some diffuse tenderness (no rebound) on palpation, no bruit heard GU: no cva tenderness NEURO: Pt is awake/alert, moves all extremitiesx4 EXTREMITIES: pulses normal, full  ROM SKIN: warm, color normal PSYCH: no abnormalities of mood noted  Results for orders placed or performed in visit on 06/06/15  POCT CBC  Result Value Ref Range   WBC 15.2 (A) 4.6 - 10.2 K/uL   Lymph, poc 2.5 0.6 - 3.4   POC LYMPH PERCENT 16.3 10 - 50 %L   MID (cbc) 2.4 (A) 0 - 0.9   POC MID % 15.7 (A) 0 - 12 %M   POC Granulocyte 10.3 (A) 2 - 6.9   Granulocyte percent 68.0 37 - 80 %G   RBC 5.20 4.04 - 5.48 M/uL   Hemoglobin 14.5 12.2 - 16.2 g/dL   HCT, POC 43.3 37.7 - 47.9 %   MCV 83.2 80 - 97 fL   MCH, POC 27.9 27 - 31.2 pg   MCHC 33.5 31.8 - 35.4 g/dL   RDW, POC 24.1 %   Platelet Count, POC 321 142 - 424 K/uL   MPV 8.2 0 - 99.8 fL  POCT urinalysis dipstick  Result Value Ref Range   Color, UA yellow yellow   Clarity, UA cloudy (A) clear   Glucose, UA negative negative   Bilirubin, UA small (A) negative   Ketones, POC UA trace (5) (A) negative   Spec Grav, UA 1.015    Blood, UA large (A) negative   pH, UA 5.5    Protein Ur, POC >=300 (A) negative   Urobilinogen, UA 0.2    Nitrite, UA Negative Negative   Leukocytes, UA Trace (A) Negative  POCT Microscopic Urinalysis ([facility])  Result Value Ref Range   WBC,UR,HPF,POC Many (A) None WBC/hpf   RBC,UR,HPF,POC Too numerous to count  (  A) None RBC/hpf   Bacteria Moderate (A) None, Too numerous to count   Mucus Absent Absent   Epithelial Cells, UR Per Microscopy Few (A) None, Too numerous to count cells/hpf   Hyaline Cast positive         Assessment & Plan:   This chart was scribed in my presence and reviewed by me personally.    ICD-9-CM ICD-10-CM   1. Intractable vomiting with nausea, vomiting of unspecified type 536.2 R11.10 POCT CBC     POCT urinalysis dipstick     POCT Microscopic Urinalysis ([facility])     COMPLETE METABOLIC PANEL WITH GFR     DG Abd Acute W/Chest     ondansetron (ZOFRAN) injection 2 mg    Patient is obviously chronically, ill and acutely ill with what appears to be intestinal obstruction. She shows  chronic wasting with an acute abdominal pain.   It's hard to know whether she has intestinal angina whether this obstruction from adhesions.   For these reasons I'm transferring her to the emergency department.  Signed, Robyn Haber, MD

## 2015-06-06 NOTE — ED Notes (Signed)
Pt did not answer.

## 2015-06-06 NOTE — ED Notes (Addendum)
Per EMS, Pt reports abd pain with n/v since yesterday.  [facility] the [facility].  NSR.  EMS reports pt reported to them that she started to come to the ED but saw that there was a long wait so she left and went to the [facility].

## 2015-06-07 ENCOUNTER — Encounter (HOSPITAL_COMMUNITY): Payer: Self-pay

## 2015-06-07 ENCOUNTER — Encounter (HOSPITAL_COMMUNITY)
Admission: EM | Disposition: A | Discharge status: Home Health | Payer: Self-pay | Source: Home / Self Care | Attending: Internal Medicine

## 2015-06-07 ENCOUNTER — Inpatient Hospital Stay (HOSPITAL_COMMUNITY)
Admission: EM | Admit: 2015-06-07 | Discharge: 2015-06-14 | DRG: 329 | Disposition: A | Discharge status: Home Health | Payer: Medicare Other | Attending: Internal Medicine | Admitting: Internal Medicine

## 2015-06-07 ENCOUNTER — Other Ambulatory Visit: Payer: Self-pay

## 2015-06-07 ENCOUNTER — Inpatient Hospital Stay (HOSPITAL_COMMUNITY): Payer: Medicare Other | Admitting: Certified Registered Nurse Anesthetist

## 2015-06-07 ENCOUNTER — Emergency Department (HOSPITAL_COMMUNITY): Payer: Medicare Other

## 2015-06-07 DIAGNOSIS — I739 Peripheral vascular disease, unspecified: Secondary | ICD-10-CM | POA: Diagnosis present

## 2015-06-07 DIAGNOSIS — K56609 Unspecified intestinal obstruction, unspecified as to partial versus complete obstruction: Secondary | ICD-10-CM | POA: Diagnosis present

## 2015-06-07 DIAGNOSIS — N2889 Other specified disorders of kidney and ureter: Secondary | ICD-10-CM | POA: Diagnosis present

## 2015-06-07 DIAGNOSIS — Z681 Body mass index (BMI) 19 or less, adult: Secondary | ICD-10-CM | POA: Diagnosis not present

## 2015-06-07 DIAGNOSIS — K3189 Other diseases of stomach and duodenum: Secondary | ICD-10-CM | POA: Diagnosis present

## 2015-06-07 DIAGNOSIS — Z79899 Other long term (current) drug therapy: Secondary | ICD-10-CM | POA: Diagnosis not present

## 2015-06-07 DIAGNOSIS — Z86711 Personal history of pulmonary embolism: Secondary | ICD-10-CM | POA: Diagnosis not present

## 2015-06-07 DIAGNOSIS — G8929 Other chronic pain: Secondary | ICD-10-CM | POA: Diagnosis present

## 2015-06-07 DIAGNOSIS — Z903 Acquired absence of stomach [part of]: Secondary | ICD-10-CM | POA: Diagnosis not present

## 2015-06-07 DIAGNOSIS — E43 Unspecified severe protein-calorie malnutrition: Secondary | ICD-10-CM | POA: Diagnosis present

## 2015-06-07 DIAGNOSIS — E870 Hyperosmolality and hypernatremia: Secondary | ICD-10-CM | POA: Diagnosis not present

## 2015-06-07 DIAGNOSIS — Z7901 Long term (current) use of anticoagulants: Secondary | ICD-10-CM | POA: Diagnosis not present

## 2015-06-07 DIAGNOSIS — Z8711 Personal history of peptic ulcer disease: Secondary | ICD-10-CM | POA: Diagnosis not present

## 2015-06-07 DIAGNOSIS — K255 Chronic or unspecified gastric ulcer with perforation: Secondary | ICD-10-CM | POA: Diagnosis present

## 2015-06-07 DIAGNOSIS — K219 Gastro-esophageal reflux disease without esophagitis: Secondary | ICD-10-CM | POA: Diagnosis present

## 2015-06-07 DIAGNOSIS — I10 Essential (primary) hypertension: Secondary | ICD-10-CM | POA: Diagnosis present

## 2015-06-07 DIAGNOSIS — R0602 Shortness of breath: Secondary | ICD-10-CM

## 2015-06-07 DIAGNOSIS — Z87891 Personal history of nicotine dependence: Secondary | ICD-10-CM | POA: Diagnosis not present

## 2015-06-07 DIAGNOSIS — E876 Hypokalemia: Secondary | ICD-10-CM | POA: Diagnosis present

## 2015-06-07 DIAGNOSIS — J441 Chronic obstructive pulmonary disease with (acute) exacerbation: Secondary | ICD-10-CM | POA: Diagnosis present

## 2015-06-07 DIAGNOSIS — K561 Intussusception: Secondary | ICD-10-CM | POA: Diagnosis present

## 2015-06-07 DIAGNOSIS — Z825 Family history of asthma and other chronic lower respiratory diseases: Secondary | ICD-10-CM | POA: Diagnosis not present

## 2015-06-07 DIAGNOSIS — M810 Age-related osteoporosis without current pathological fracture: Secondary | ICD-10-CM | POA: Diagnosis present

## 2015-06-07 DIAGNOSIS — S81801A Unspecified open wound, right lower leg, initial encounter: Secondary | ICD-10-CM | POA: Diagnosis present

## 2015-06-07 DIAGNOSIS — M4327 Fusion of spine, lumbosacral region: Secondary | ICD-10-CM | POA: Diagnosis present

## 2015-06-07 DIAGNOSIS — R112 Nausea with vomiting, unspecified: Secondary | ICD-10-CM | POA: Diagnosis present

## 2015-06-07 DIAGNOSIS — R928 Other abnormal and inconclusive findings on diagnostic imaging of breast: Secondary | ICD-10-CM | POA: Diagnosis present

## 2015-06-07 DIAGNOSIS — D72829 Elevated white blood cell count, unspecified: Secondary | ICD-10-CM | POA: Diagnosis present

## 2015-06-07 DIAGNOSIS — K5669 Other intestinal obstruction: Secondary | ICD-10-CM | POA: Diagnosis not present

## 2015-06-07 DIAGNOSIS — N281 Cyst of kidney, acquired: Secondary | ICD-10-CM | POA: Diagnosis present

## 2015-06-07 DIAGNOSIS — M797 Fibromyalgia: Secondary | ICD-10-CM | POA: Diagnosis present

## 2015-06-07 HISTORY — PX: LAPAROTOMY: SHX154

## 2015-06-07 LAB — URINALYSIS, ROUTINE W REFLEX MICROSCOPIC
Bilirubin Urine: NEGATIVE
Glucose, UA: NEGATIVE mg/dL
KETONES UR: NEGATIVE mg/dL
LEUKOCYTES UA: NEGATIVE
NITRITE: NEGATIVE
PH: 6 (ref 5.0–8.0)
Protein, ur: NEGATIVE mg/dL
Specific Gravity, Urine: 1.038 — ABNORMAL HIGH (ref 1.005–1.030)

## 2015-06-07 LAB — COMPREHENSIVE METABOLIC PANEL
ALK PHOS: 90 U/L (ref 38–126)
ALT: 23 U/L (ref 14–54)
AST: 32 U/L (ref 15–41)
Albumin: 4.1 g/dL (ref 3.5–5.0)
Anion gap: 11 (ref 5–15)
BILIRUBIN TOTAL: 0.5 mg/dL (ref 0.3–1.2)
BUN: 19 mg/dL (ref 6–20)
CALCIUM: 9.1 mg/dL (ref 8.9–10.3)
CHLORIDE: 104 mmol/L (ref 101–111)
CO2: 27 mmol/L (ref 22–32)
CREATININE: 0.76 mg/dL (ref 0.44–1.00)
Glucose, Bld: 97 mg/dL (ref 65–99)
Potassium: 3.4 mmol/L — ABNORMAL LOW (ref 3.5–5.1)
Sodium: 142 mmol/L (ref 135–145)
TOTAL PROTEIN: 7.7 g/dL (ref 6.5–8.1)

## 2015-06-07 LAB — CBC WITH DIFFERENTIAL/PLATELET
BASOS PCT: 0 %
Basophils Absolute: 0 10*3/uL (ref 0.0–0.1)
EOS PCT: 0 %
Eosinophils Absolute: 0 10*3/uL (ref 0.0–0.7)
HEMATOCRIT: 40 % (ref 36.0–46.0)
HEMOGLOBIN: 13.4 g/dL (ref 12.0–15.0)
Lymphocytes Relative: 18 %
Lymphs Abs: 2.1 10*3/uL (ref 0.7–4.0)
MCH: 28.1 pg (ref 26.0–34.0)
MCHC: 33.5 g/dL (ref 30.0–36.0)
MCV: 83.9 fL (ref 78.0–100.0)
MONOS PCT: 6 %
Monocytes Absolute: 0.7 10*3/uL (ref 0.1–1.0)
NEUTROS PCT: 76 %
Neutro Abs: 8.9 10*3/uL — ABNORMAL HIGH (ref 1.7–7.7)
Platelets: 243 10*3/uL (ref 150–400)
RBC: 4.77 MIL/uL (ref 3.87–5.11)
RDW: 21.5 % — ABNORMAL HIGH (ref 11.5–15.5)
WBC: 11.7 10*3/uL — AB (ref 4.0–10.5)

## 2015-06-07 LAB — URINE MICROSCOPIC-ADD ON

## 2015-06-07 LAB — PROTIME-INR
INR: 0.98 (ref 0.00–1.49)
PROTHROMBIN TIME: 13.2 s (ref 11.6–15.2)

## 2015-06-07 SURGERY — LAPAROTOMY, EXPLORATORY
Anesthesia: General | Site: Abdomen

## 2015-06-07 MED ORDER — CEFOTETAN DISODIUM-DEXTROSE 2-2.08 GM-% IV SOLR
INTRAVENOUS | Status: AC
  Filled 2015-06-07: qty 50

## 2015-06-07 MED ORDER — FENTANYL CITRATE (PF) 100 MCG/2ML IJ SOLN
25.0000 ug | INTRAMUSCULAR | Status: DC | PRN
  Administered 2015-06-07 (×2): 50 ug via INTRAVENOUS

## 2015-06-07 MED ORDER — DEXTROSE 5 % IV SOLN
2.0000 g | INTRAVENOUS | Status: DC | PRN
  Administered 2015-06-07: 2 g via INTRAVENOUS

## 2015-06-07 MED ORDER — ONDANSETRON HCL 4 MG/2ML IJ SOLN
4.0000 mg | Freq: Four times a day (QID) | INTRAMUSCULAR | Status: DC | PRN
  Administered 2015-06-08 – 2015-06-09 (×2): 4 mg via INTRAVENOUS
  Filled 2015-06-07 (×2): qty 2

## 2015-06-07 MED ORDER — ACETAMINOPHEN 325 MG PO TABS
650.0000 mg | ORAL_TABLET | Freq: Four times a day (QID) | ORAL | Status: DC | PRN
  Administered 2015-06-11: 650 mg via ORAL
  Filled 2015-06-07: qty 2

## 2015-06-07 MED ORDER — ROCURONIUM BROMIDE 100 MG/10ML IV SOLN
INTRAVENOUS | Status: AC
Start: 2015-06-07 — End: 2015-06-07
  Filled 2015-06-07: qty 1

## 2015-06-07 MED ORDER — DEXAMETHASONE SODIUM PHOSPHATE 10 MG/ML IJ SOLN
INTRAMUSCULAR | Status: AC
Start: 2015-06-07 — End: 2015-06-07
  Filled 2015-06-07: qty 1

## 2015-06-07 MED ORDER — FENTANYL CITRATE (PF) 100 MCG/2ML IJ SOLN
INTRAMUSCULAR | Status: DC | PRN
  Administered 2015-06-07 (×3): 50 ug via INTRAVENOUS
  Administered 2015-06-07: 100 ug via INTRAVENOUS

## 2015-06-07 MED ORDER — ONDANSETRON HCL 4 MG/2ML IJ SOLN
INTRAMUSCULAR | Status: DC | PRN
  Administered 2015-06-07: 4 mg via INTRAVENOUS

## 2015-06-07 MED ORDER — DEXAMETHASONE SODIUM PHOSPHATE 10 MG/ML IJ SOLN
INTRAMUSCULAR | Status: DC | PRN
  Administered 2015-06-07: 10 mg via INTRAVENOUS

## 2015-06-07 MED ORDER — HYDROMORPHONE HCL 1 MG/ML IJ SOLN: 1.0000 mg | INTRAMUSCULAR | Status: DC | PRN

## 2015-06-07 MED ORDER — SUGAMMADEX SODIUM 200 MG/2ML IV SOLN
INTRAVENOUS | Status: AC
  Filled 2015-06-07: qty 2

## 2015-06-07 MED ORDER — ROCURONIUM BROMIDE 100 MG/10ML IV SOLN
INTRAVENOUS | Status: DC | PRN
  Administered 2015-06-07: 10 mg via INTRAVENOUS
  Administered 2015-06-07: 50 mg via INTRAVENOUS

## 2015-06-07 MED ORDER — FENTANYL CITRATE (PF) 100 MCG/2ML IJ SOLN
INTRAMUSCULAR | Status: AC
  Filled 2015-06-07: qty 2

## 2015-06-07 MED ORDER — FENTANYL CITRATE (PF) 100 MCG/2ML IJ SOLN
50.0000 ug | Freq: Once | INTRAMUSCULAR | Status: AC
  Administered 2015-06-07: 50 ug via INTRAVENOUS
  Filled 2015-06-07: qty 2

## 2015-06-07 MED ORDER — HYDRALAZINE HCL 20 MG/ML IJ SOLN
10.0000 mg | Freq: Once | INTRAMUSCULAR | Status: AC
  Administered 2015-06-07: 10 mg via INTRAVENOUS
  Filled 2015-06-07: qty 1

## 2015-06-07 MED ORDER — PROPOFOL 10 MG/ML IV BOLUS
INTRAVENOUS | Status: DC | PRN
  Administered 2015-06-07: 110 mg via INTRAVENOUS

## 2015-06-07 MED ORDER — PROMETHAZINE HCL 25 MG/ML IJ SOLN: 6.2500 mg | INTRAMUSCULAR | Status: DC | PRN

## 2015-06-07 MED ORDER — PROTAMINE SULFATE 10 MG/ML IV SOLN
40.0000 mg | Freq: Once | INTRAVENOUS | Status: DC
  Filled 2015-06-07: qty 4

## 2015-06-07 MED ORDER — HYDROMORPHONE HCL 1 MG/ML IJ SOLN
INTRAMUSCULAR | Status: DC | PRN
  Administered 2015-06-07 (×2): 1 mg via INTRAVENOUS

## 2015-06-07 MED ORDER — LIDOCAINE HCL (CARDIAC) 20 MG/ML IV SOLN
INTRAVENOUS | Status: AC
  Filled 2015-06-07: qty 5

## 2015-06-07 MED ORDER — FENTANYL CITRATE (PF) 250 MCG/5ML IJ SOLN
INTRAMUSCULAR | Status: AC
  Filled 2015-06-07: qty 5

## 2015-06-07 MED ORDER — HYDROMORPHONE HCL 1 MG/ML IJ SOLN
1.0000 mg | INTRAMUSCULAR | Status: DC | PRN
Start: 2015-06-07 — End: 2015-06-14
  Administered 2015-06-07 – 2015-06-13 (×30): 1 mg via INTRAVENOUS
  Filled 2015-06-07 (×30): qty 1

## 2015-06-07 MED ORDER — SODIUM CHLORIDE 0.9 % IJ SOLN
INTRAMUSCULAR | Status: AC
  Filled 2015-06-07: qty 10

## 2015-06-07 MED ORDER — IOHEXOL 350 MG/ML SOLN
100.0000 mL | Freq: Once | INTRAVENOUS | Status: AC | PRN
  Administered 2015-06-07: 100 mL via INTRAVENOUS

## 2015-06-07 MED ORDER — ONDANSETRON HCL 4 MG/2ML IJ SOLN: 4.0000 mg | Freq: Four times a day (QID) | INTRAMUSCULAR | Status: DC | PRN

## 2015-06-07 MED ORDER — MIDAZOLAM HCL 2 MG/2ML IJ SOLN
INTRAMUSCULAR | Status: AC
  Filled 2015-06-07: qty 2

## 2015-06-07 MED ORDER — ONDANSETRON 4 MG PO TBDP
4.0000 mg | ORAL_TABLET | Freq: Four times a day (QID) | ORAL | Status: DC | PRN
  Filled 2015-06-07: qty 1

## 2015-06-07 MED ORDER — PROPOFOL 10 MG/ML IV BOLUS
INTRAVENOUS | Status: AC
Start: 2015-06-07 — End: 2015-06-07
  Filled 2015-06-07: qty 20

## 2015-06-07 MED ORDER — EPHEDRINE SULFATE 50 MG/ML IJ SOLN
INTRAMUSCULAR | Status: AC
  Filled 2015-06-07: qty 1

## 2015-06-07 MED ORDER — IOHEXOL 300 MG/ML  SOLN: 50.0000 mL | Freq: Once | INTRAMUSCULAR | Status: DC | PRN

## 2015-06-07 MED ORDER — SUGAMMADEX SODIUM 200 MG/2ML IV SOLN
INTRAVENOUS | Status: DC | PRN
  Administered 2015-06-07: 100 mg via INTRAVENOUS

## 2015-06-07 MED ORDER — ONDANSETRON HCL 4 MG/2ML IJ SOLN
4.0000 mg | Freq: Once | INTRAMUSCULAR | Status: AC
  Administered 2015-06-07: 4 mg via INTRAVENOUS
  Filled 2015-06-07: qty 2

## 2015-06-07 MED ORDER — POTASSIUM CHLORIDE IN NACL 20-0.9 MEQ/L-% IV SOLN: INTRAVENOUS | Status: DC

## 2015-06-07 MED ORDER — MEPERIDINE HCL 50 MG/ML IJ SOLN: 6.2500 mg | INTRAMUSCULAR | Status: DC | PRN

## 2015-06-07 MED ORDER — HEPARIN SODIUM (PORCINE) 5000 UNIT/ML IJ SOLN
5000.0000 [IU] | Freq: Three times a day (TID) | INTRAMUSCULAR | Status: DC
  Administered 2015-06-07 – 2015-06-10 (×8): 5000 [IU] via SUBCUTANEOUS
  Filled 2015-06-07 (×13): qty 1

## 2015-06-07 MED ORDER — 0.9 % SODIUM CHLORIDE (POUR BTL) OPTIME
TOPICAL | Status: DC | PRN
  Administered 2015-06-07: 2000 mL

## 2015-06-07 MED ORDER — CEFOTETAN DISODIUM-DEXTROSE 2-2.08 GM-% IV SOLR: 2.0000 g | INTRAVENOUS | Status: DC

## 2015-06-07 MED ORDER — HYDROMORPHONE HCL 1 MG/ML IJ SOLN
0.5000 mg | Freq: Once | INTRAMUSCULAR | Status: AC
  Administered 2015-06-07: 0.5 mg via INTRAVENOUS
  Filled 2015-06-07: qty 1

## 2015-06-07 MED ORDER — ONDANSETRON HCL 4 MG/2ML IJ SOLN
INTRAMUSCULAR | Status: AC
  Filled 2015-06-07: qty 2

## 2015-06-07 MED ORDER — LACTATED RINGERS IV SOLN
INTRAVENOUS | Status: DC | PRN
  Administered 2015-06-07: 14:00:00 via INTRAVENOUS

## 2015-06-07 MED ORDER — ACETAMINOPHEN 650 MG RE SUPP
650.0000 mg | Freq: Four times a day (QID) | RECTAL | Status: DC | PRN
  Filled 2015-06-07: qty 1

## 2015-06-07 MED ORDER — SODIUM CHLORIDE 0.9 % IV SOLN
Freq: Once | INTRAVENOUS | Status: AC
  Administered 2015-06-07: 10:00:00 via INTRAVENOUS

## 2015-06-07 MED ORDER — POTASSIUM CHLORIDE IN NACL 20-0.9 MEQ/L-% IV SOLN
INTRAVENOUS | Status: DC
  Administered 2015-06-07 – 2015-06-09 (×4): via INTRAVENOUS
  Filled 2015-06-07 (×6): qty 1000

## 2015-06-07 MED ORDER — MIDAZOLAM HCL 5 MG/5ML IJ SOLN
INTRAMUSCULAR | Status: DC | PRN
  Administered 2015-06-07 (×2): 1 mg via INTRAVENOUS

## 2015-06-07 MED ORDER — DEXTROSE 5 % IV SOLN
INTRAVENOUS | Status: AC
  Filled 2015-06-07 (×2): qty 2

## 2015-06-07 MED ORDER — ONDANSETRON 4 MG PO TBDP
4.0000 mg | ORAL_TABLET | Freq: Once | ORAL | Status: DC | PRN
  Filled 2015-06-07: qty 1

## 2015-06-07 MED ORDER — LIDOCAINE HCL (CARDIAC) 20 MG/ML IV SOLN
INTRAVENOUS | Status: DC | PRN
  Administered 2015-06-07: 30 mg via INTRAVENOUS

## 2015-06-07 MED ORDER — ONDANSETRON HCL 4 MG PO TABS
4.0000 mg | ORAL_TABLET | Freq: Four times a day (QID) | ORAL | Status: DC | PRN
  Filled 2015-06-07: qty 1

## 2015-06-07 MED ORDER — HYDROMORPHONE HCL 2 MG/ML IJ SOLN
INTRAMUSCULAR | Status: AC
  Filled 2015-06-07: qty 1

## 2015-06-07 SURGICAL SUPPLY — 43 items
CHLORAPREP W/TINT 26ML (MISCELLANEOUS) ×3 IMPLANT
COVER MAYO STAND STRL (DRAPES) ×3 IMPLANT
COVER SURGICAL LIGHT HANDLE (MISCELLANEOUS) ×3 IMPLANT
DRAPE LAPAROSCOPIC ABDOMINAL (DRAPES) ×3 IMPLANT
DRAPE UTILITY XL STRL (DRAPES) ×3 IMPLANT
DRAPE WARM FLUID 44X44 (DRAPE) ×3 IMPLANT
DRSG OPSITE POSTOP 4X10 (GAUZE/BANDAGES/DRESSINGS) IMPLANT
DRSG OPSITE POSTOP 4X12 (GAUZE/BANDAGES/DRESSINGS) ×3 IMPLANT
DRSG OPSITE POSTOP 4X6 (GAUZE/BANDAGES/DRESSINGS) IMPLANT
DRSG OPSITE POSTOP 4X8 (GAUZE/BANDAGES/DRESSINGS) IMPLANT
ELECT BLADE 6.5 EXT (BLADE) IMPLANT
ELECT CAUTERY BLADE 6.4 (BLADE) ×3 IMPLANT
ELECT REM PT RETURN 9FT ADLT (ELECTROSURGICAL) ×3
ELECTRODE REM PT RTRN 9FT ADLT (ELECTROSURGICAL) ×1 IMPLANT
GLOVE BIOGEL PI IND STRL 7.0 (GLOVE) ×1 IMPLANT
GLOVE BIOGEL PI INDICATOR 7.0 (GLOVE) ×2
GLOVE SURG SS PI 7.0 STRL IVOR (GLOVE) ×3 IMPLANT
GLOVE SURG SS PI 8.0 STRL IVOR (GLOVE) ×9 IMPLANT
GOWN STRL REUS W/TWL LRG LVL3 (GOWN DISPOSABLE) ×3 IMPLANT
GOWN STRL REUS W/TWL XL LVL3 (GOWN DISPOSABLE) ×3 IMPLANT
HANDLE SUCTION POOLE (INSTRUMENTS) ×1 IMPLANT
KIT BASIN OR (CUSTOM PROCEDURE TRAY) ×3 IMPLANT
LIGASURE IMPACT 36 18CM CVD LR (INSTRUMENTS) ×3 IMPLANT
PACK GENERAL/GYN (CUSTOM PROCEDURE TRAY) ×3 IMPLANT
RELOAD PROXIMATE 100 BLUE (MISCELLANEOUS) IMPLANT
RELOAD PROXIMATE 100MM BLUE (MISCELLANEOUS)
RELOAD PROXIMATE 75MM BLUE (ENDOMECHANICALS) ×9 IMPLANT
SPONGE LAP 18X18 X RAY DECT (DISPOSABLE) IMPLANT
STAPLER PROXIMATE 100MM BLUE (MISCELLANEOUS) IMPLANT
STAPLER PROXIMATE 75MM BLUE (STAPLE) ×3 IMPLANT
STAPLER VISISTAT 35W (STAPLE) IMPLANT
SUCTION POOLE HANDLE (INSTRUMENTS) ×3
SUT PDS AB 0 CT1 36 (SUTURE) ×6 IMPLANT
SUT SILK 2 0 (SUTURE) ×2
SUT SILK 2 0 SH CR/8 (SUTURE) ×3 IMPLANT
SUT SILK 2-0 18XBRD TIE 12 (SUTURE) ×1 IMPLANT
SUT SILK 3 0 (SUTURE) ×2
SUT SILK 3 0 SH CR/8 (SUTURE) ×9 IMPLANT
SUT SILK 3-0 18XBRD TIE 12 (SUTURE) ×1 IMPLANT
TOWEL OR 17X26 10 PK STRL BLUE (TOWEL DISPOSABLE) ×3 IMPLANT
TOWEL OR NON WOVEN STRL DISP B (DISPOSABLE) ×3 IMPLANT
TRAY FOLEY BAG SILVER LF 16FR (SET/KITS/TRAYS/PACK) ×3 IMPLANT
YANKAUER SUCT BULB TIP NO VENT (SUCTIONS) ×3 IMPLANT

## 2015-06-07 NOTE — Anesthesia Procedure Notes (Signed)
Procedure Name: Intubation Date/Time: 06/07/2015 2:46 PM Performed by: Lajuana Carry E Pre-anesthesia Checklist: Patient identified, Emergency Drugs available, Suction available and Patient being monitored Patient Re-evaluated:Patient Re-evaluated prior to inductionOxygen Delivery Method: Circle System Utilized Preoxygenation: Pre-oxygenation with 100% oxygen Intubation Type: IV induction, Cricoid Pressure applied and Rapid sequence Laryngoscope Size: Mac and 3 Grade View: Grade I Tube type: Subglottic suction tube Tube size: 7.0 mm Number of attempts: 1 Airway Equipment and Method: Stylet Placement Confirmation: ETT inserted through vocal cords under direct vision,  positive ETCO2 and breath sounds checked- equal and bilateral Secured at: 20 cm Tube secured with: Tape Dental Injury: Teeth and Oropharynx as per pre-operative assessment

## 2015-06-07 NOTE — Consult Note (Signed)
Reason for Consult:  Enteroenteric intussusception at the mid small bowel with secondary small bowel obstruction. Referring Physician:  Dr. Curly Rim CC  Abdominal pain nausea and vomiting 10/10 pain  PCP:  [name] Koyanagi, MD    Cynthia Bowen is an 70 y.o. female.   HPI: Pt with hx of multiple abdominal surgeries, who was appaently seen at Urgent Care and sent to the ED with an intestinal obstruction.  She was in the ED,but apparently not seen.  Today she came by EMS with 10/10 pain, brown colored emesis, no flatus, last BM yesterday. Work up shows she is afebrile, VSS.  Labs show WBC is up but better than yesterday 11.7 today, remainder of the CBC is normal, K+ 3.4 otherwise negative.  CT scan shows Dilated small bowel up to 7.6 cm diameter  Mid small bowel obstruction identified secondary to an entero-enteric intussusception in the central pelvis ; wall thickening at the distal intussusceptum suspicious for mass. Distal small bowel loops and colon appear decompressed. Extensive atherosclerotic disease post aortoiliac stent graft placement and LEFT axillofemoral bypass grafting.  BILATERAL nonobstructing renal calculi and renal cysts. 1.5 x 1.0 cm diameter potential in neoplasm at upper pole LEFT kidney.  Past Medical History  Diagnosis Date  . COPD (chronic obstructive pulmonary disease) ([place])   . Pneumonia 12-2011  . GERD (gastroesophageal reflux disease)   . Headache(784.0)   . Arthritis     osteoarthritis  . Allergy   . Depression   . Neuromuscular disorder ([place])   . Osteoporosis   . Bronchitis     CURRENTLY AS OF 06/30/12 - HAS COUGH AND FINISHED ANTIBIOTIC FOR BRONCHITIS  . Fibromyalgia   . Pain     ABDOMINAL PAIN AND NAUSEA  . Pain     SOMETIMES PAIN RIGHT EAR AND NECK--STATES CAUSED BY A "LUMP" ON BACK OF EAR--USES KENALOG CREAM TOPICALLY AS NEEDED.  Marland Kitchen Gastrocutaneous fistula   . Anemia   . Anxiety   . Blood transfusion without reported diagnosis   . Heart murmur      young  . Peripheral vascular disease (HCC)     hx  ?leg  . History of kidney stones    Multiple medical allergies   . Pulmonary embolism [facility])     Past Surgical History  Procedure Laterality Date   Partial gastrectomy for EC fistula 04/15/13 at [facility] in [place].              . Abdominal hysterectomy    . Esophagogastroduodenoscopy  04/18/2012    Procedure: ESOPHAGOGASTRODUODENOSCOPY (EGD);  Surgeon: Inda Castle, MD;  Location: [facility];  Service: Endoscopy;  Laterality: N/A;  . Eus  05/29/2012    Procedure: UPPER ENDOSCOPIC ULTRASOUND (EUS) LINEAR;  Surgeon: Milus Banister, MD;  Location: [facility];  Service: Endoscopy;  Laterality: N/A;  . Appendectomy    . Spine surgery      CERVICAL SPINE SURGERY X 2 - INCLUDING FUSION; LUMBAR SURGERY FOR RUPTURED DISC  . Eye surgery      BILATERAL CATARACT EXTRACTIONS  . Laparoscopic abdominal exploration N/A 07/02/2012    Procedure: converted to laparotomy with gastric biopsy;  Surgeon: Imogene Burn. Georgette Dover, MD;  Location: [facility];  Service: General;  Laterality: N/A;  Laparoscopic Gastric Biospy attempted.   . Laparotomy N/A 07/07/2012    Procedure: EXPLORATORY LAPAROTOMY repair of gastric perforation with omental graham patch, drainage of abdominal abcess;  Surgeon: Imogene Burn. Georgette Dover, MD;  Location: [facility];  Service: General;  Laterality: N/A;  . Stomach surgery  07/07/2012    Omental patch of gastric perforation  . Laparotomy N/A 07/13/2012    Procedure: EXPLORATORY LAPAROTOMY repair gastric leak;  Surgeon: Edward Jolly, MD;  Location: [facility];  Service: General;  Laterality: N/A;  . Laparotomy N/A 11/11/2012    Procedure: EXPLORATORY LAPAROTOMY;  Surgeon: Imogene Burn. Georgette Dover, MD;  Location: [place];  Service: General;  Laterality: N/A;  . Bowel resection N/A 11/11/2012    Procedure: SMALL BOWEL RESECTION;  Surgeon: Imogene Burn. Georgette Dover, MD;  Location: [place];  Service: General;  Laterality: N/A;  . Minor  application of wound vac N/A 11/11/2012    Procedure: APPLICATION OF WOUND VAC;  Surgeon: Imogene Burn. Georgette Dover, MD;  Location: [place];  Service: General;  Laterality: N/A;  . Jejunostomy N/A 11/11/2012    Procedure: PLACEMENT OF FEEDING JEJUNOSTOMY TUBE;  Surgeon: Imogene Burn. Georgette Dover, MD;  Location: [place];  Service: General;  Laterality: N/A;  . Lysis of adhesion N/A 11/11/2012    Procedure: LYSIS OF ADHESION;  Surgeon: Imogene Burn. Georgette Dover, MD;  Location: [place];  Service: General;  Laterality: N/A;  . Hernia repair    . Gastric fistula repair      05/16/2013  . Colonoscopy    . Polypectomy    . Upper gastrointestinal endoscopy    . Abdominal aortagram N/A 08/19/2014    Procedure: ABDOMINAL Maxcine Ham;  Surgeon: Conrad Geyser, MD;  Location: [facility] CATH LAB;  Service: Cardiovascular;  Laterality: N/A;  . Abdominal aortic endovascular stent graft N/A 08/26/2014    Procedure: AORTIC ENDOVASCULAR  COVERED STENT ;  Surgeon: Conrad Francis, MD;  Location: [place];  Service: Vascular;  Laterality: N/A;  IVUS  . Femoral-femoral bypass graft Bilateral 09/04/2014    Procedure: Right External Iliac and right comon femoral endarterectomy with patching and right to left BYPASS GRAFT FEMORAL-FEMORAL ARTERY, ;  Surgeon: Rosetta Posner, MD;  Location: [place];  Service: Vascular;  Laterality: Bilateral;  . Wound exploration Left 12/30/2014    Procedure: WOUND EXPLORATION;  Surgeon: Conrad Billings, MD;  Location: [place];  Service: Vascular;  Laterality: Left;  . Irrigation and debridement abscess Bilateral 12/30/2014    Procedure: IRRIGATION AND DEBRIDEMENT of bilateral groin ABSCESSes;  Surgeon: Conrad Henry, MD;  Location: [place];  Service: Vascular;  Laterality: Bilateral;  . Application of wound vac Right 12/30/2014    Procedure: APPLICATION OF WOUND VAC to right groin;  Surgeon: Conrad Cecil, MD;  Location: [place];  Service: Vascular;  Laterality: Right;  . Femoral-femoral bypass graft Bilateral 12/31/2014    Procedure: REMOVAL INFECTED RIGHT  FEMORAL ARTERY TO LEFT FEMORAL ARTERY BYPASS GRAFT;  Surgeon: Conrad [place], MD;  Location: [place];  Service: Vascular;  Laterality: Bilateral;  . Axillary-femoral bypass graft Left 12/31/2014    Procedure: LEFT  AXILLA RY ARTERY TO LEFT FEMORAL ARTERY BYPASS GRAFT;  Surgeon: Conrad Capron, MD;  Location: [place];  Service: Vascular;  Laterality: Left;  . Patch angioplasty Right 12/31/2014    Procedure: RIGHT FEMORAL ARTERY PATCH ANGIOPLASTY USING Rueben Bash BIOLOGIC PATCH;  Surgeon: Conrad [place], MD;  Location: [place];  Service: Vascular;  Laterality: Right;  . Intraoperative arteriogram Left 12/31/2014    Procedure: INTRA OPERATIVE ARTERIOGRAM;  Surgeon: Conrad Haleiwa, MD;  Location: [place];  Service: Vascular;  Laterality: Left;  . Wound exploration Left 01/11/2015    Procedure: LEFT AXILLARY WOUND EXPLORATION  AND DRAINAGE OF SEROMA;  Surgeon: Conrad Mansfield, MD;  Location: [place];  Service: Vascular;  Laterality: Left;  . Application of wound vac Left 01/11/2015    Procedure: APPLICATION OF NEGATIVE PRESSURE WOUND DRESSING TO LEFT AXILLA  AND LEFT GROIN;  Surgeon: Conrad [place]field, MD;  Location: [place];  Service: Vascular;  Laterality: Left;  . Ovarian cyst removal      Family History  Problem Relation Age of Onset  . COPD Mother   . Hyperlipidemia Mother   . Hypertension Maternal Grandmother   . Stroke Maternal Grandmother   . Colon cancer Neg Hx     Social History:  reports that she quit smoking about 10 years ago. Her smoking use included Cigarettes. She started smoking about 12 years ago. She has a 17.5 pack-year smoking history. She has never used smokeless tobacco. She reports that she does not drink alcohol or use illicit drugs. Tobacco:  40+ years, quit 10 years ago, < 1PPD ETOH:  Social  None for 8 years Drugs:  None Lives alone     Allergies:  Allergies  Allergen Reactions  . Avelox [Moxifloxacin Hcl In Nacl] Nausea And Vomiting  . Betadine [Povidone Iodine] Itching and Rash  . Alendronate  Sodium Nausea And Vomiting and Other (See Comments)    dizziness  . Aspirin Nausea Only  . Codeine Nausea And Vomiting  . Doxycycline Nausea And Vomiting  . Fluconazole Nausea And Vomiting  . Hydrocodone Nausea And Vomiting    GI distress  . Hydrocodone-Acetaminophen Nausea And Vomiting  . Morphine And Related Nausea Only  . Neurontin [Gabapentin] Other (See Comments)    Mood changes   . Nsaids Other (See Comments)    Severe gastritis & perforation - avoid NSAIDs when possible  . Quinolones Hives and Itching  . Sertraline Hcl Nausea And Vomiting and Other (See Comments)    Hallucinations   . Sulfamethoxazole Hives and Itching  . Latex Rash  . Sulfa Antibiotics Rash    Prior to Admission medications   Medication Sig Start Date End Date Taking? Authorizing Provider  acetaminophen (TYLENOL) 500 MG tablet Take 1,000 mg by mouth every 8 (eight) hours as needed for mild pain or moderate pain. Reported on 05/20/2015   Yes Historical Provider, MD  dicyclomine (BENTYL) 20 MG tablet Take 20 mg by mouth 4 (four) times daily -  before meals and at bedtime. Reported on 04/16/2015   Yes Historical Provider, MD  enoxaparin (LOVENOX) 40 MG/0.4ML injection Inject 0.4 mLs (40 mg total) into the skin every 12 (twelve) hours. 06/03/15  Yes Pixie Casino, MD  feeding supplement, RESOURCE BREEZE, (RESOURCE BREEZE) LIQD Take 1 Container by mouth 3 (three) times daily between meals. 06/05/14  Yes Modena Jansky, MD  fluocinonide cream (LIDEX) 7.62 % Apply 1 application topically 2 (two) times daily as needed. Reported on 05/05/2015 11/07/14  Yes Historical Provider, MD  HYDROmorphone (DILAUDID) 2 MG tablet Take 2 mg by mouth every 6 (six) hours as needed for severe pain. Reported on 05/20/2015 02/01/15  Yes Historical Provider, MD  ketotifen (ZADITOR) 0.025 % ophthalmic solution Place 1 drop into both eyes 2 (two) times daily.   Yes Historical Provider, MD  LORazepam (ATIVAN) 1 MG tablet Take 1 mg by mouth 2  (two) times daily. Reported on 05/20/2015 02/01/15  Yes Historical Provider, MD  Multiple Vitamin (MULTIVITAMIN WITH MINERALS) TABS tablet Take 1 tablet by mouth daily. 01/12/15  Yes Samantha J Rhyne, PA-C  mupirocin  ointment (BACTROBAN) 2 % Place 1 application into the nose 2 (two) times daily. 05/05/15  Yes Robyn Haber, MD  ondansetron (ZOFRAN) 4 MG tablet Take 4 mg by mouth every 8 (eight) hours as needed for nausea or vomiting.   Yes Historical Provider, MD  polyethylene glycol powder (GLYCOLAX/MIRALAX) powder MIX 17 G (1 CAPFUL) IN 8 OZ OF LIQUID 2 TIMES A DAY AS NEEDED 05/24/15  Yes [name] Koyanagi, MD  pregabalin (LYRICA) 50 MG capsule Take 50 mg by mouth 3 (three) times daily. 1 capsule twice daily, and 2 capsules at bedtime   Yes Historical Provider, MD  Probiotic Product (PROBIOTIC DAILY PO) Take 1 capsule by mouth daily.   Yes Historical Provider, MD  ranitidine (ZANTAC) 150 MG tablet TAKE 1 TABLET TWICE DAILY 11/08/14  Yes [name] Koyanagi, MD  Simethicone (GAS-X EXTRA STRENGTH PO) Take 1 tablet by mouth 3 (three) times daily after meals.    Yes Historical Provider, MD  vitamin C (ASCORBIC ACID) 500 MG tablet Take 500 mg by mouth daily.   Yes Historical Provider, MD  Vitamin D, Ergocalciferol, (DRISDOL) 50000 UNITS CAPS capsule Take 1 capsule (50,000 Units total) by mouth every 7 (seven) days. Patient taking differently: Take 50,000 Units by mouth every 7 (seven) days. On Saturday 06/29/14  Yes Robyn Haber, MD  zinc gluconate 50 MG tablet Take 50 mg by mouth daily.   Yes Historical Provider, MD  oxyCODONE-acetaminophen (PERCOCET/ROXICET) 5-325 MG tablet Take 1 tablet by mouth every 4 (four) hours as needed for severe pain. 05/13/15   Conrad [place], MD  warfarin (COUMADIN) 5 MG tablet Take 2.5-5 mg by mouth. Monday and Friday take 2.59m and every other day take 525mdaily 03/16/15   Historical Provider, MD     Results for orders placed or performed during the hospital encounter of  06/07/15 (from the past 48 hour(s))  CBC with Differential/Platelet     Status: Abnormal   Collection Time: 06/07/15  9:55 AM  Result Value Ref Range   WBC 11.7 (H) 4.0 - 10.5 K/uL   RBC 4.77 3.87 - 5.11 MIL/uL   Hemoglobin 13.4 12.0 - 15.0 g/dL   HCT 40.0 36.0 - 46.0 %   MCV 83.9 78.0 - 100.0 fL   MCH 28.1 26.0 - 34.0 pg   MCHC 33.5 30.0 - 36.0 g/dL   RDW 21.5 (H) 11.5 - 15.5 %   Platelets 243 150 - 400 K/uL   Neutrophils Relative % 76 %   Lymphocytes Relative 18 %   Monocytes Relative 6 %   Eosinophils Relative 0 %   Basophils Relative 0 %   Neutro Abs 8.9 (H) 1.7 - 7.7 K/uL   Lymphs Abs 2.1 0.7 - 4.0 K/uL   Monocytes Absolute 0.7 0.1 - 1.0 K/uL   Eosinophils Absolute 0.0 0.0 - 0.7 K/uL   Basophils Absolute 0.0 0.0 - 0.1 K/uL   RBC Morphology TARGET CELLS   Comprehensive metabolic panel     Status: Abnormal   Collection Time: 06/07/15  9:55 AM  Result Value Ref Range   Sodium 142 135 - 145 mmol/L   Potassium 3.4 (L) 3.5 - 5.1 mmol/L   Chloride 104 101 - 111 mmol/L   CO2 27 22 - 32 mmol/L   Glucose, Bld 97 65 - 99 mg/dL   BUN 19 6 - 20 mg/dL   Creatinine, Ser 0.76 0.44 - 1.00 mg/dL   Calcium 9.1 8.9 - 10.3 mg/dL   Total Protein 7.7 6.5 -  8.1 g/dL   Albumin 4.1 3.5 - 5.0 g/dL   AST 32 15 - 41 U/L   ALT 23 14 - 54 U/L   Alkaline Phosphatase 90 38 - 126 U/L   Total Bilirubin 0.5 0.3 - 1.2 mg/dL   GFR calc non Af Amer >60 >60 mL/min   GFR calc Af Amer >60 >60 mL/min    Comment: (NOTE) The eGFR has been calculated using the CKD EPI equation. This calculation has not been validated in all clinical situations. eGFR's persistently <60 mL/min signify possible Chronic Kidney Disease.    Anion gap 11 5 - 15  Protime-INR     Status: None   Collection Time: 06/07/15  9:55 AM  Result Value Ref Range   Prothrombin Time 13.2 11.6 - 15.2 seconds   INR 0.98 0.00 - 1.49  Urinalysis, Routine w reflex microscopic (not at [facility])     Status: Abnormal   Collection Time: 06/07/15 10:45  AM  Result Value Ref Range   Color, Urine YELLOW YELLOW   APPearance CLEAR CLEAR   Specific Gravity, Urine 1.038 (H) 1.005 - 1.030   pH 6.0 5.0 - 8.0   Glucose, UA NEGATIVE NEGATIVE mg/dL   Hgb urine dipstick MODERATE (A) NEGATIVE   Bilirubin Urine NEGATIVE NEGATIVE   Ketones, ur NEGATIVE NEGATIVE mg/dL   Protein, ur NEGATIVE NEGATIVE mg/dL   Nitrite NEGATIVE NEGATIVE   Leukocytes, UA NEGATIVE NEGATIVE  Urine microscopic-add on     Status: Abnormal   Collection Time: 06/07/15 10:45 AM  Result Value Ref Range   Squamous Epithelial / LPF 0-5 (A) NONE SEEN   WBC, UA 6-30 0 - 5 WBC/hpf   RBC / HPF TOO NUMEROUS TO COUNT 0 - 5 RBC/hpf   Bacteria, UA RARE (A) NONE SEEN   Urine-Other MUCOUS PRESENT     Ct Cta Abd/pel W/cm &/or W/o Cm  06/07/2015  CLINICAL DATA:  Lower abdominal pain since yesterday morning, nausea, vomiting, emesis coffee brown in color, 20 pound weight loss over 2 years, diffuse abdominal tenderness, history peripheral arterial disease post abdominal aortic endovascular stent graft placement, COPD, former smoker EXAM: CTA ABDOMEN AND PELVIS wITHOUT AND WITH CONTRAST TECHNIQUE: Multidetector CT imaging of the abdomen and pelvis was performed using the standard protocol during bolus administration of intravenous contrast. Multiplanar reconstructed images and MIPs were obtained and reviewed to evaluate the vascular anatomy. CONTRAST:  18m OMNIPAQUE IOHEXOL 350 MG/ML SOLN COMPARISON:  None. FINDINGS: Emphysematous changes at lung bases with LEFT basilar scarring. Small pericardial effusion. BILATERAL renal cysts. 15 x 10 mm nodule at posterior aspect upper pole LEFT kidney, concerning for renal neoplasm. Multiple BILATERAL nonobstructing renal calculi largest 10 mm diameter RIGHT kidney image 41. Liver, gallbladder, spleen, pancreas, kidneys, and adrenal glands otherwise unremarkable. Post antrectomy and gastrojejunostomy. Dilated small bowel up to 7.6 cm diameter. Mid small bowel  obstruction identified secondary to an entero-enteric intussusception in the central pelvis ; wall thickening at the distal intussusceptum suspicious for mass. Distal small bowel loops and colon appear decompressed. Small amount of nonspecific free intraperitoneal fluid. Focal fluid attenuation collection in RIGHT pelvis 2.7 x 2.1 cm image 79 question ovarian cyst. Uterus surgically absent. Bones demineralized with degenerative disc disease changes at L4-L5. Extensive atherosclerotic calcifications. Prior endoluminal stenting of the abdominal aorta extending into common iliac arteries. Thrombus is seen within the stent at the distal aorta extending into the common iliac arteries bilaterally. Marked narrowing of RIGHT common iliac artery at its origin. Significant thrombus  within LEFT common and external iliac arteries with lack of contrast opacification of the LEFT external iliac artery consistent with thrombosis. Patent LEFT axillofemoral bypass graft. LEFT internal iliac artery appears occluded though with distal filling of branches by collaterals. Significant narrowing of the RIGHT common femoral artery with suspected occlusion of the proximal RIGHT SFA. Occluded LEFT common femoral artery. No evidence of periaortic hemorrhage. Review of the MIP images confirms the above findings. IMPRESSION: Enteroenteric intussusception at the mid small bowel with secondary small bowel obstruction. Suspected lead point/mass within small bowel as eitiology. No evidence of bowel perforation. Extensive atherosclerotic disease post aortoiliac stent graft placement and LEFT axillofemoral bypass grafting. BILATERAL nonobstructing renal calculi and renal cysts. 1.5 x 1.0 cm diameter potential in neoplasm at upper pole LEFT kidney. Small pericardial effusion. Findings called to Dr. Kathrynn Humble on 06/07/2015 at 1119 hours. Electronically Signed   By: Lavonia Dana M.D.   On: 06/07/2015 11:22    Review of Systems  Constitutional: Positive  for weight loss (20 pounds, over uncertain time period). Negative for fever.  HENT: Negative.   Eyes:       Glasses  Respiratory: Negative.   Cardiovascular: Negative.   Gastrointestinal: Positive for heartburn, nausea, vomiting and abdominal pain (acute and chronic abdominal pain). Negative for diarrhea, constipation, blood in stool and melena.  Genitourinary: Negative.   Musculoskeletal: Negative.   Skin: Negative.   Neurological: Negative.   Endo/Heme/Allergies: Bruises/bleeds easily (on Coumadin and Lovenox  ).  Psychiatric/Behavioral: Positive for depression.       Chronic abdominal pain.   Blood pressure 154/70, pulse 84, temperature 98.2 F (36.8 C), temperature source Oral, resp. rate 18, SpO2 100 %. Physical Exam  Constitutional: She is oriented to person, place, and time.  Frail chronically ill woman having significant pain.    HENT:  Head: Normocephalic.  NG placed in right nares  Eyes: Right eye exhibits no discharge. Left eye exhibits no discharge. No scleral icterus.  Neck: Neck supple. No JVD present. No tracheal deviation present. No thyromegaly present.  Cardiovascular: Regular rhythm and normal heart sounds.   In SR, tachycardic, no distal pulses felt.  She has bruits over the femorals and the ax feb graft.    Respiratory: Effort normal and breath sounds normal. No respiratory distress. She has no wheezes. She has no rales. She exhibits no tenderness.  GI: Soft. She exhibits distension (mild). She exhibits no mass. There is tenderness (pain and tender all over.). There is no rebound and no guarding.  Few BS  Musculoskeletal: She exhibits no edema.  Lymphadenopathy:    She has no cervical adenopathy.  Neurological: She is alert and oriented to person, place, and time. No cranial nerve deficit.  Skin: Skin is warm and dry. No rash noted. No erythema. No pallor.  She has an ulcer RLE, which I did not take down dressing.  I will look at it after she has surgery.   Psychiatric: She has a normal mood and affect. Her behavior is normal. Judgment and thought content normal.  She is very stressed and having allot of pain but seems to understand what is occuring.    Assessment/Plan: Abdominal pain, nausea and vomiting with enteroenteric intussusception of the small bowel with SBO Multiple abdominal surgeries  Gastric biopsy with perforation, and multiple surgeries, including partial gastrectomy for EC fistula 04/15/13. Chronic abdominal pain/fibromyalgia PE and significant vascular disease on chronic anticoagulation  (currently on Lovenox bridging last dose this AM) COPD/ 40 year hx of  tobacco use, quit  10 years ago. PVD with AAA endovascular stent and left to right Axillary to fem-fem bypass, 12/31/14, removal of of old Fem-Fem bypass Bilateral non obstructing renal calculi Left 1.5 x 1.0 renal mass, potential neoplasm Multiple medicine allergies (16 listed) Non healing ulcer RLE for skin grafting tomorrow with plastics   Plan:  Medicine has admitted.  We have seen her and plan to take her to OR for exploratory laparotomy, and resection of the small bowel/mass to relieve the SBO.  She has been seen and examined by Dr. Kieth Brightly.  He has explained the risk and benefits of the surgery and she is agreeable to it.  NG was placed in the Ed with brown colored fluid coming from the NG.    ECHO 10/21/13 shows EF 65-70% and otherwise normal. Rolanda Campa 06/07/2015, 12:42 PM

## 2015-06-07 NOTE — Transfer of Care (Signed)
Immediate Anesthesia Transfer of Care Note  Patient: Cynthia Bowen  Procedure(s) Performed: Procedure(s): EXPLORATORY LAPAROTOMY, LYSIS OF ADHESIONS,  WITH BOWEL RESECTION OFR SMALL BOWEL INTUSSUSCEPTION (N/A)  Patient Location: PACU  Anesthesia Type:General  Level of Consciousness: awake, alert , oriented and patient cooperative  Airway & Oxygen Therapy: Patient Spontanous Breathing and Patient connected to face mask oxygen  Post-op Assessment: Report given to RN, Post -op Vital signs reviewed and stable and Patient moving all extremities X 4  Post vital signs: Reviewed and stable  Last Vitals:  Filed Vitals:   06/07/15 1137 06/07/15 1243  BP: 154/70 153/77  Pulse: 84 82  Temp:    Resp: 18 18    Complications: No apparent anesthesia complications

## 2015-06-07 NOTE — Op Note (Signed)
Preoperative diagnosis: intussuception  Postoperative diagnosis: intussuception of small bowel   Procedure: small bowel resection with anastomosis, lysis of adhesions  Surgeon: Gurney Maxin, M.D.  Asst: Jackolyn Confer, M.D.  Anesthesia: General  Indications for procedure: Cynthia Bowen is a 70 y.o. year old female with symptoms of abdominal pain, nausea, vomiting for 3 days. Work up showed a >8cm portion of intussuception of the distal small bowel.  Description of procedure: The patient was brought into the operative suite. Anesthesia was administered with General endotracheal anesthesia. WHO checklist was applied. The patient was then placed in supine. The area was prepped and draped in the usual sterile fashion.  Next a midline incision was made and cautery was used to enter the peritoneal cavity in the superior aspect to avoid injury to scar tissue in the previous surgical sites. No injury was made on entry and the remainder of the scar tissue was incised. Next the small bowel was run beginning with the most obvious area and moving distally in doing this we found 2 previous anastomoses 1 going up to a retrocolic gastrojejunostomy and 1 forming a Roux limb going back to the ligament of Treitz. Multiple filmy adhesions were taken down from the abdominal wall. There is one area which appeared to be previous jejunostomy site was released from the abdominal wall as well.   Just distal to this Roux anastomosis was a 10 cm area of intussusception. Distal portion was marked with stitch and slowly worked to reduce. Full reduction was able to be accomplished in there appeared some thickened edematous area as a lead point. Small bowel was resected using 2 75 mm GIA staplers and a LigaSure to resect the mesentery. An anastomosis was made with a 75 GIA blue load between 2 small bowel and the enterotomy is closed with one additional 75 mm GIA stapler. The mesentery was closed with multiple interrupted 3-0  silk stitches. Anastomosis was patent and appeared to have good blood supply and had no tension.  Again ran the anastomosis from ligament of Treitz down to the terminal ileum the loop GJ appeared put a strange kink in the intestine however since this was chronic we left it alone at this time. The previous J-tube June ostomy tube take down had some damage to the serosa therefore multiple lumbar 3-0 silk stitches were put in place to reinforce this. After this the intestines were put back within the abdominal cavity a natural occurring position, 1 L of irrigation was used, and the fascia was closed using a 0 PDS in running fashion. Due to the lack of subcutaneous in the middle of the wound the PDS was skived and deeper area to grab fascia. The knots were put in places with large amounts of subcutaneous tissue and healthier fascia to improve the likelihood of healing. Staples were used to close the skin dressing was put in place patient then awoke from anesthesia and brought to PACU in stable condition.  Findings: Large portion of distal small bowel intussusception, loop GJ reconstruction with additional Roux-en-Y reconstruction more distally  Specimen: Intussusception of small bowel  Implant: None  Blood loss: less than 159ml  Local anesthesia: 0 ml 0.25% marcaine with epinephrine  Complications: none  Gurney Maxin, M.D. General, Bariatric, & Minimally Invasive Surgery [facility]

## 2015-06-07 NOTE — ED Provider Notes (Signed)
CSN: [id number]     Arrival date & time 06/07/15  [id number] History   First MD Initiated Contact with Patient 06/07/15 [phone]     Chief Complaint  Patient presents with  . Abdominal Pain  . Emesis     (Consider location/radiation/quality/duration/timing/severity/associated sxs/prior Treatment) HPI Comments: Pt comes in with cc of abd pain. She has hx of peripheral arterial disease s/p femoral-popliteal bypass grafting (which was apparently infected) and ultimately needed left axillary femoral bypass surgery. She also has hx of gastric perforation, PE and is on coumadin. Pt started having severe abd pain yday morning, pain is constant, sharp and with that she has nausea and emesis - emesis x 7 which is brown in color. Pt is not passing flatus, last BM was yday.   ROS 10 Systems reviewed and are negative for acute change except as noted in the HPI.      Patient is a 70 y.o. female presenting with abdominal pain and vomiting. The history is provided by the patient.  Abdominal Pain Associated symptoms: vomiting   Emesis Associated symptoms: abdominal pain     Past Medical History  Diagnosis Date  . COPD (chronic obstructive pulmonary disease) ([place])   . Pneumonia 12-2011  . GERD (gastroesophageal reflux disease)   . Headache(784.0)   . Arthritis     osteoarthritis  . Allergy   . Depression   . Neuromuscular disorder ([place])   . Osteoporosis   . Bronchitis     CURRENTLY AS OF 06/30/12 - HAS COUGH AND FINISHED ANTIBIOTIC FOR BRONCHITIS  . Fibromyalgia   . Pain     ABDOMINAL PAIN AND NAUSEA  . Pain     SOMETIMES PAIN RIGHT EAR AND NECK--STATES CAUSED BY A "LUMP" ON BACK OF EAR--USES KENALOG CREAM TOPICALLY AS NEEDED.  Marland Kitchen Gastrocutaneous fistula   . Anemia   . Anxiety   . Blood transfusion without reported diagnosis   . Heart murmur     young  . Peripheral vascular disease (HCC)     hx  ?leg  . History of kidney stones   . Pulmonary embolism [facility])    Past Surgical History   Procedure Laterality Date  . Abdominal hysterectomy    . Esophagogastroduodenoscopy  04/18/2012    Procedure: ESOPHAGOGASTRODUODENOSCOPY (EGD);  Surgeon: Inda Castle, MD;  Location: [facility];  Service: Endoscopy;  Laterality: N/A;  . Eus  05/29/2012    Procedure: UPPER ENDOSCOPIC ULTRASOUND (EUS) LINEAR;  Surgeon: Milus Banister, MD;  Location: [facility];  Service: Endoscopy;  Laterality: N/A;  . Appendectomy    . Spine surgery      CERVICAL SPINE SURGERY X 2 - INCLUDING FUSION; LUMBAR SURGERY FOR RUPTURED DISC  . Eye surgery      BILATERAL CATARACT EXTRACTIONS  . Laparoscopic abdominal exploration N/A 07/02/2012    Procedure: converted to laparotomy with gastric biopsy;  Surgeon: Imogene Burn. Georgette Dover, MD;  Location: [facility];  Service: General;  Laterality: N/A;  Laparoscopic Gastric Biospy attempted.   . Laparotomy N/A 07/07/2012    Procedure: EXPLORATORY LAPAROTOMY repair of gastric perforation with omental graham patch, drainage of abdominal abcess;  Surgeon: Imogene Burn. Georgette Dover, MD;  Location: [facility];  Service: General;  Laterality: N/A;  . Stomach surgery  07/07/2012    Omental patch of gastric perforation  . Laparotomy N/A 07/13/2012    Procedure: EXPLORATORY LAPAROTOMY repair gastric leak;  Surgeon: Edward Jolly, MD;  Location: [facility];  Service: General;  Laterality: N/A;  . Laparotomy  N/A 11/11/2012    Procedure: EXPLORATORY LAPAROTOMY;  Surgeon: Imogene Burn. Georgette Dover, MD;  Location: [place];  Service: General;  Laterality: N/A;  . Bowel resection N/A 11/11/2012    Procedure: SMALL BOWEL RESECTION;  Surgeon: Imogene Burn. Georgette Dover, MD;  Location: [place];  Service: General;  Laterality: N/A;  . Minor application of wound vac N/A 11/11/2012    Procedure: APPLICATION OF WOUND VAC;  Surgeon: Imogene Burn. Georgette Dover, MD;  Location: [place];  Service: General;  Laterality: N/A;  . Jejunostomy N/A 11/11/2012    Procedure: PLACEMENT OF FEEDING JEJUNOSTOMY TUBE;  Surgeon: Imogene Burn. Georgette Dover, MD;  Location: [place];  Service: General;  Laterality: N/A;  . Lysis of adhesion N/A 11/11/2012    Procedure: LYSIS OF ADHESION;  Surgeon: Imogene Burn. Georgette Dover, MD;  Location: [place];  Service: General;  Laterality: N/A;  . Hernia repair    . Gastric fistula repair      05/16/2013  . Colonoscopy    . Polypectomy    . Upper gastrointestinal endoscopy    . Abdominal aortagram N/A 08/19/2014    Procedure: ABDOMINAL Maxcine Ham;  Surgeon: Conrad Rutherford, MD;  Location: [facility] CATH LAB;  Service: Cardiovascular;  Laterality: N/A;  . Abdominal aortic endovascular stent graft N/A 08/26/2014    Procedure: AORTIC ENDOVASCULAR  COVERED STENT ;  Surgeon: Conrad P[place]burg, MD;  Location: [place];  Service: Vascular;  Laterality: N/A;  IVUS  . Femoral-femoral bypass graft Bilateral 09/04/2014    Procedure: Right External Iliac and right comon femoral endarterectomy with patching and right to left BYPASS GRAFT FEMORAL-FEMORAL ARTERY, ;  Surgeon: Rosetta Posner, MD;  Location: [place];  Service: Vascular;  Laterality: Bilateral;  . Wound exploration Left 12/30/2014    Procedure: WOUND EXPLORATION;  Surgeon: Conrad [place], MD;  Location: [place];  Service: Vascular;  Laterality: Left;  . Irrigation and debridement abscess Bilateral 12/30/2014    Procedure: IRRIGATION AND DEBRIDEMENT of bilateral groin ABSCESSes;  Surgeon: Conrad [place], MD;  Location: [place];  Service: Vascular;  Laterality: Bilateral;  . Application of wound vac Right 12/30/2014    Procedure: APPLICATION OF WOUND VAC to right groin;  Surgeon: Conrad [place], MD;  Location: [place];  Service: Vascular;  Laterality: Right;  . Femoral-femoral bypass graft Bilateral 12/31/2014    Procedure: REMOVAL INFECTED RIGHT FEMORAL ARTERY TO LEFT FEMORAL ARTERY BYPASS GRAFT;  Surgeon: Conrad Costilla, MD;  Location: [place];  Service: Vascular;  Laterality: Bilateral;  . Axillary-femoral bypass graft Left 12/31/2014    Procedure: LEFT  AXILLA RY ARTERY TO LEFT FEMORAL ARTERY BYPASS GRAFT;  Surgeon: Conrad Comanche, MD;   Location: [place];  Service: Vascular;  Laterality: Left;  . Patch angioplasty Right 12/31/2014    Procedure: RIGHT FEMORAL ARTERY PATCH ANGIOPLASTY USING Rueben Bash BIOLOGIC PATCH;  Surgeon: Conrad [place], MD;  Location: [place];  Service: Vascular;  Laterality: Right;  . Intraoperative arteriogram Left 12/31/2014    Procedure: INTRA OPERATIVE ARTERIOGRAM;  Surgeon: Conrad Th[place], MD;  Location: [place];  Service: Vascular;  Laterality: Left;  . Wound exploration Left 01/11/2015    Procedure: LEFT AXILLARY WOUND EXPLORATION AND DRAINAGE OF SEROMA;  Surgeon: Conrad Clendenin, MD;  Location: [place];  Service: Vascular;  Laterality: Left;  . Application of wound vac Left 01/11/2015    Procedure: APPLICATION OF NEGATIVE PRESSURE WOUND DRESSING TO LEFT AXILLA  AND LEFT GROIN;  Surgeon: Conrad , MD;  Location: [facility]  OR;  Service: Vascular;  Laterality: Left;  . Ovarian cyst removal     Family History  Problem Relation Age of Onset  . COPD Mother   . Hyperlipidemia Mother   . Hypertension Maternal Grandmother   . Stroke Maternal Grandmother   . Colon cancer Neg Hx    Social History  Substance Use Topics  . Smoking status: Former Smoker -- 0.50 packs/day for 35 years    Types: Cigarettes    Start date: 01/04/2003    Quit date: 04/17/2005  . Smokeless tobacco: Never Used  . Alcohol Use: No   OB History    No data available     Review of Systems  Gastrointestinal: Positive for vomiting and abdominal pain.      Allergies  Avelox; Betadine; Alendronate sodium; Aspirin; Codeine; Doxycycline; Fluconazole; Hydrocodone; Hydrocodone-acetaminophen; Morphine and related; Neurontin; Nsaids; Quinolones; Sertraline hcl; Sulfamethoxazole; Latex; and Sulfa antibiotics  Home Medications   Prior to Admission medications   Medication Sig Start Date End Date Taking? Authorizing Provider  acetaminophen (TYLENOL) 500 MG tablet Take 1,000 mg by mouth every 8 (eight) hours as needed for mild pain or moderate pain.  Reported on 05/20/2015    Historical Provider, MD  dicyclomine (BENTYL) 20 MG tablet Take 20 mg by mouth 4 (four) times daily -  before meals and at bedtime. Reported on 04/16/2015    Historical Provider, MD  enoxaparin (LOVENOX) 40 MG/0.4ML injection Inject 0.4 mLs (40 mg total) into the skin every 12 (twelve) hours. 06/03/15   Pixie Casino, MD  feeding supplement, RESOURCE BREEZE, (RESOURCE BREEZE) LIQD Take 1 Container by mouth 3 (three) times daily between meals. 06/05/14   Modena Jansky, MD  fluocinonide cream (LIDEX) [id number] % Apply 1 application topically 2 (two) times daily as needed. Reported on 05/05/2015 11/07/14   Historical Provider, MD  HYDROmorphone (DILAUDID) 2 MG tablet Reported on 05/20/2015 02/01/15   Historical Provider, MD  ketotifen (ZADITOR) 0.025 % ophthalmic solution Place 1 drop into both eyes 2 (two) times daily.    Historical Provider, MD  LORazepam (ATIVAN) 1 MG tablet Reported on 05/20/2015 02/01/15   Historical Provider, MD  Multiple Vitamin (MULTIVITAMIN WITH MINERALS) TABS tablet Take 1 tablet by mouth daily. 01/12/15   Samantha J Rhyne, PA-C  mupirocin ointment (BACTROBAN) 2 % Place 1 application into the nose 2 (two) times daily. 05/05/15   Robyn Haber, MD  ondansetron (ZOFRAN) 4 MG tablet Take 4 mg by mouth every 8 (eight) hours as needed for nausea or vomiting.  11/23/14   Historical Provider, MD  oxyCODONE-acetaminophen (PERCOCET/ROXICET) 5-325 MG tablet Take 1 tablet by mouth every 4 (four) hours as needed for severe pain. 05/13/15   Conrad [place], MD  polyethylene glycol powder (GLYCOLAX/MIRALAX) powder MIX 17 G (1 CAPFUL) IN 8 OZ OF LIQUID 2 TIMES A DAY AS NEEDED 05/24/15   [name] Koyanagi, MD  pregabalin (LYRICA) 50 MG capsule Take 50-100 mg by mouth 3 (three) times daily. 1 capsule twice daily, and 2 capsules at bedtime    Historical Provider, MD  Probiotic Product (PROBIOTIC DAILY PO) Take 1 capsule by mouth daily.    Historical Provider, MD  ranitidine (ZANTAC)  150 MG tablet TAKE 1 TABLET TWICE DAILY 11/08/14   [name] Koyanagi, MD  Simethicone (GAS-X EXTRA STRENGTH PO) Take 1 tablet by mouth 3 (three) times daily after meals.     Historical Provider, MD  vitamin C (ASCORBIC ACID) 500 MG tablet Take 500 mg by  mouth daily.    Historical Provider, MD  Vitamin D, Ergocalciferol, (DRISDOL) 50000 UNITS CAPS capsule Take 1 capsule (50,000 Units total) by mouth every 7 (seven) days. Patient taking differently: Take 50,000 Units by mouth every 7 (seven) days. On Saturday 06/29/14   Robyn Haber, MD  warfarin (COUMADIN) 1 MG tablet Reported on 05/20/2015 04/02/15   Historical Provider, MD  warfarin (COUMADIN) 4 MG tablet Reported on 05/20/2015 03/23/15   Historical Provider, MD  warfarin (COUMADIN) 5 MG tablet  03/16/15   Historical Provider, MD  warfarin (COUMADIN) 6 MG tablet Take 6 mg by mouth daily. Reported on 05/20/2015 02/09/15   Historical Provider, MD  zinc gluconate 50 MG tablet Take 50 mg by mouth daily.    Historical Provider, MD   BP 132/80 mmHg  Pulse 109  Temp(Src) 98.2 F (36.8 C) (Oral)  Resp 18  SpO2 100% Physical Exam  Constitutional: She is oriented to person, place, and time. She appears well-developed.  HENT:  Head: Normocephalic and atraumatic.  Eyes: Conjunctivae and EOM are normal. Pupils are equal, round, and reactive to light.  Neck: Normal range of motion. Neck supple.  Cardiovascular: Normal rate, regular rhythm and normal heart sounds.   Pulmonary/Chest: Effort normal and breath sounds normal. No respiratory distress.  Abdominal: Soft. Bowel sounds are normal. She exhibits no distension. There is tenderness. There is guarding. There is no rebound.  Diffuse tenderness, abd is soft  Neurological: She is alert and oriented to person, place, and time.  Skin: Skin is warm and dry.  Nursing note and vitals reviewed.   ED Course  Procedures (including critical care time) Labs Review Labs Reviewed  CBC WITH  DIFFERENTIAL/PLATELET  COMPREHENSIVE METABOLIC PANEL  PROTIME-INR  URINALYSIS, ROUTINE W REFLEX MICROSCOPIC (NOT AT [facility])  I-STAT CG4 LACTIC ACID, ED    Imaging Review No results found. I have personally reviewed and evaluated these images and lab results as part of my medical decision-making.   EKG Interpretation None      MDM   Final diagnoses:  None    Pt with hx of abd surgery and severe PAD comes in with abd pain with nausea and emesis.  Clinical concerns for SBO and mesenteric ischemia - as pain is out of proportion. The former is favored however with the emesis abnd no flatus. Will get labs. CT ordered.     Varney Biles, MD 06/07/15 [phone]

## 2015-06-07 NOTE — ED Notes (Signed)
Sister will pick up belongings

## 2015-06-07 NOTE — ED Notes (Signed)
Surgery at bedside.

## 2015-06-07 NOTE — ED Notes (Signed)
Pt c/o generalized abdominal pain and emesis x 1 day.  Pain score 10/10.  Pt has not taken anything for pain.  Pt was brought in by EMS yesterday, but left without seen.

## 2015-06-07 NOTE — H&P (Addendum)
[facility] History and Physical  Cynthia Bowen ZJI:[id number] DOB: Feb 05, 1946 DOA: 06/07/2015   PCP: [name] Koyanagi, MD  Specialists: Patient is followed by Dr. Vallarie Mare with vascular surgery. Dr. Theodoro Kos is Psychiatric nurse. She's also followed by Dr. Debara Pickett with cardiology.  Chief Complaint: Abdominal pain with nausea and vomiting sport past 2 days  HPI: Cynthia Bowen is a 70 y.o. female with a past medical history of peripheral vascular disease, right lower extremity chronic wound, history of pulmonary embolism on warfarin, previous history of gastric perforation requiring surgery and omental patch, who presented to the hospital with complaints of nausea, vomiting and abdominal pain ongoing for 2 days. She has vomited innumerable number of times. She is vomiting brown, black liquid, which is extremely foul-smelling. Not sure of any blood in the emesis. Pain is located mainly in the upper abdomen. 10 out of 10 in intensity, constant for the most part, occasionally feels as if it's cramping. No radiation. She had her last bowel movement 2 days ago without any bleeding. No fever, some chills. He is not passing any gas from below. She was supposed to undergo wound closure surgery to her right ankle tomorrow by Dr. Migdalia Dk. She has been on Lovenox as a bridging treatment for the last 4 days. She was taken off of her warfarin on Friday. Patient also tells me that she's lost about 20 pounds in the last 2 years. She had a colonoscopy last year which was unremarkable. She had a mammogram in August, which was read as abnormal and she was supposed to follow-up for the same. However, due to vascular surgery that she underwent September, she has not been able to do so.  In the emergency department, she underwent a CT scan of her abdomen, pelvis, which revealed small bowel obstruction with possible small bowel intussusception. Due to her medical problemswe were called for admission. General surgery  was consulted.  Home Medications: Prior to Admission medications   Medication Sig Start Date End Date Taking? Authorizing Provider  acetaminophen (TYLENOL) 500 MG tablet Take 1,000 mg by mouth every 8 (eight) hours as needed for mild pain or moderate pain. Reported on 05/20/2015   Yes Historical Provider, MD  dicyclomine (BENTYL) 20 MG tablet Take 20 mg by mouth 4 (four) times daily -  before meals and at bedtime. Reported on 04/16/2015   Yes Historical Provider, MD  enoxaparin (LOVENOX) 40 MG/0.4ML injection Inject 0.4 mLs (40 mg total) into the skin every 12 (twelve) hours. 06/03/15  Yes Pixie Casino, MD  feeding supplement, RESOURCE BREEZE, (RESOURCE BREEZE) LIQD Take 1 Container by mouth 3 (three) times daily between meals. 06/05/14  Yes Modena Jansky, MD  fluocinonide cream (LIDEX) 1.75 % Apply 1 application topically 2 (two) times daily as needed. Reported on 05/05/2015 11/07/14  Yes Historical Provider, MD  HYDROmorphone (DILAUDID) 2 MG tablet Take 2 mg by mouth every 6 (six) hours as needed for severe pain. Reported on 05/20/2015 02/01/15  Yes Historical Provider, MD  ketotifen (ZADITOR) 0.025 % ophthalmic solution Place 1 drop into both eyes 2 (two) times daily.   Yes Historical Provider, MD  LORazepam (ATIVAN) 1 MG tablet Take 1 mg by mouth 2 (two) times daily. Reported on 05/20/2015 02/01/15  Yes Historical Provider, MD  Multiple Vitamin (MULTIVITAMIN WITH MINERALS) TABS tablet Take 1 tablet by mouth daily. 01/12/15  Yes Samantha J Rhyne, PA-C  mupirocin ointment (BACTROBAN) 2 % Place 1 application into the nose 2 (two) times  daily. 05/05/15  Yes Robyn Haber, MD  ondansetron (ZOFRAN) 4 MG tablet Take 4 mg by mouth every 8 (eight) hours as needed for nausea or vomiting.   Yes Historical Provider, MD  polyethylene glycol powder (GLYCOLAX/MIRALAX) powder MIX 17 G (1 CAPFUL) IN 8 OZ OF LIQUID 2 TIMES A DAY AS NEEDED 05/24/15  Yes [name] Koyanagi, MD  pregabalin (LYRICA) 50 MG capsule Take  50 mg by mouth 3 (three) times daily. 1 capsule twice daily, and 2 capsules at bedtime   Yes Historical Provider, MD  Probiotic Product (PROBIOTIC DAILY PO) Take 1 capsule by mouth daily.   Yes Historical Provider, MD  ranitidine (ZANTAC) 150 MG tablet TAKE 1 TABLET TWICE DAILY 11/08/14  Yes [name] Koyanagi, MD  Simethicone (GAS-X EXTRA STRENGTH PO) Take 1 tablet by mouth 3 (three) times daily after meals.    Yes Historical Provider, MD  vitamin C (ASCORBIC ACID) 500 MG tablet Take 500 mg by mouth daily.   Yes Historical Provider, MD  Vitamin D, Ergocalciferol, (DRISDOL) 50000 UNITS CAPS capsule Take 1 capsule (50,000 Units total) by mouth every 7 (seven) days. Patient taking differently: Take 50,000 Units by mouth every 7 (seven) days. On Saturday 06/29/14  Yes Robyn Haber, MD  zinc gluconate 50 MG tablet Take 50 mg by mouth daily.   Yes Historical Provider, MD  oxyCODONE-acetaminophen (PERCOCET/ROXICET) 5-325 MG tablet Take 1 tablet by mouth every 4 (four) hours as needed for severe pain. 05/13/15   Conrad Chicot, MD  warfarin (COUMADIN) 5 MG tablet Take 2.5-5 mg by mouth. Monday and Friday take 2.2m and every other day take 520mdaily 03/16/15   Historical Provider, MD    Allergies:  Allergies  Allergen Reactions  . Avelox [Moxifloxacin Hcl In Nacl] Nausea And Vomiting  . Betadine [Povidone Iodine] Itching and Rash  . Alendronate Sodium Nausea And Vomiting and Other (See Comments)    dizziness  . Aspirin Nausea Only  . Codeine Nausea And Vomiting  . Doxycycline Nausea And Vomiting  . Fluconazole Nausea And Vomiting  . Hydrocodone Nausea And Vomiting    GI distress  . Hydrocodone-Acetaminophen Nausea And Vomiting  . Morphine And Related Nausea Only  . Neurontin [Gabapentin] Other (See Comments)    Mood changes   . Nsaids Other (See Comments)    Severe gastritis & perforation - avoid NSAIDs when possible  . Quinolones Hives and Itching  . Sertraline Hcl Nausea And Vomiting and  Other (See Comments)    Hallucinations   . Sulfamethoxazole Hives and Itching  . Latex Rash  . Sulfa Antibiotics Rash    Past Medical History: Past Medical History  Diagnosis Date  . COPD (chronic obstructive pulmonary disease) ([place]  . Pneumonia 12-2011  . GERD (gastroesophageal reflux disease)   . Headache(784.0)   . Arthritis     osteoarthritis  . Allergy   . Depression   . Neuromuscular disorder ([facility]  . Osteoporosis   . Bronchitis     CURRENTLY AS OF 06/30/12 - HAS COUGH AND FINISHED ANTIBIOTIC FOR BRONCHITIS  . Fibromyalgia   . Pain     ABDOMINAL PAIN AND NAUSEA  . Pain     SOMETIMES PAIN RIGHT EAR AND NECK--STATES CAUSED BY A "LUMP" ON BACK OF EAR--USES KENALOG CREAM TOPICALLY AS NEEDED.  . Marland Kitchenastrocutaneous fistula   . Anemia   . Anxiety   . Blood transfusion without reported diagnosis   . Heart murmur     young  .  Peripheral vascular disease (HCC)     hx  ?leg  . History of kidney stones   . Pulmonary embolism [facility])     Past Surgical History  Procedure Laterality Date  . Abdominal hysterectomy    . Esophagogastroduodenoscopy  04/18/2012    Procedure: ESOPHAGOGASTRODUODENOSCOPY (EGD);  Surgeon: Inda Castle, MD;  [facility];  Service: Endoscopy;  Laterality: N/A;  . Eus  05/29/2012    Procedure: UPPER ENDOSCOPIC ULTRASOUND (EUS) LINEAR;  Surgeon: Milus Banister, MD;  Location: [facility];  Service: Endoscopy;  Laterality: N/A;  . Appendectomy    . Spine surgery      CERVICAL SPINE SURGERY X 2 - INCLUDING FUSION; LUMBAR SURGERY FOR RUPTURED DISC  . Eye surgery      BILATERAL CATARACT EXTRACTIONS  . Laparoscopic abdominal exploration N/A 07/02/2012    Procedure: converted to laparotomy with gastric biopsy;  Surgeon: Imogene Burn. Georgette [place], MD;  Location: [facility];  Service: General;  Laterality: N/A;  Laparoscopic Gastric Biospy attempted.   . Laparotomy N/A 07/07/2012    Procedure: EXPLORATORY LAPAROTOMY repair of gastric perforation with omental  graham patch, drainage of abdominal abcess;  Surgeon: Imogene Burn. Georgette [place], MD;  Location: [facility];  Service: General;  Laterality: N/A;  . Stomach surgery  07/07/2012    Omental patch of gastric perforation  . Laparotomy N/A 07/13/2012    Procedure: EXPLORATORY LAPAROTOMY repair gastric leak;  Surgeon: Edward Jolly, MD;  Location: [facility];  Service: General;  Laterality: N/A;  . Laparotomy N/A 11/11/2012    Procedure: EXPLORATORY LAPAROTOMY;  Surgeon: Imogene Burn. Georgette [place], MD;  Location: [place];  Service: General;  Laterality: N/A;  . Bowel resection N/A 11/11/2012    Procedure: SMALL BOWEL RESECTION;  Surgeon: Imogene Burn. Georgette [place], MD;  Location: [place];  Service: General;  Laterality: N/A;  . Minor application of wound vac N/A 11/11/2012    Procedure: APPLICATION OF WOUND VAC;  Surgeon: Imogene Burn. Georgette [place], MD;  Location: [place];  Service: General;  Laterality: N/A;  . Jejunostomy N/A 11/11/2012    Procedure: PLACEMENT OF FEEDING JEJUNOSTOMY TUBE;  Surgeon: Imogene Burn. Georgette [place], MD;  Location: [place];  Service: General;  Laterality: N/A;  . Lysis of adhesion N/A 11/11/2012    Procedure: LYSIS OF ADHESION;  Surgeon: Imogene Burn. Georgette [place], MD;  Location: [place];  Service: General;  Laterality: N/A;  . Hernia repair    . Gastric fistula repair      05/16/2013  . Colonoscopy    . Polypectomy    . Upper gastrointestinal endoscopy    . Abdominal aortagram N/A 08/19/2014    Procedure: ABDOMINAL Maxcine Ham;  Surgeon: Conrad [place], MD;  Location: [facility] LAB;  Service: Cardiovascular;  Laterality: N/A;  . Abdominal aortic endovascular stent graft N/A 08/26/2014    Procedure: AORTIC ENDOVASCULAR  COVERED STENT ;  Surgeon: Conrad Kissee Mills, MD;  Location: [place];  Service: Vascular;  Laterality: N/A;  IVUS  . Femoral-femoral bypass graft Bilateral 09/04/2014    Procedure: Right External Iliac and right comon femoral endarterectomy with patching and right to left BYPASS GRAFT FEMORAL-FEMORAL ARTERY, ;  Surgeon: Rosetta Posner, MD;  Location:  [place];  Service: Vascular;  Laterality: Bilateral;  . Wound exploration Left 12/30/2014    Procedure: WOUND EXPLORATION;  Surgeon: Conrad , MD;  Location: [place];  Service: Vascular;  Laterality: Left;  . Irrigation and debridement abscess Bilateral 12/30/2014    Procedure: IRRIGATION AND DEBRIDEMENT of bilateral  groin ABSCESSes;  Surgeon: Conrad Mangham, MD;  Location: [place];  Service: Vascular;  Laterality: Bilateral;  . Application of wound vac Right 12/30/2014    Procedure: APPLICATION OF WOUND VAC to right groin;  Surgeon: Conrad Waltham, MD;  Location: [place];  Service: Vascular;  Laterality: Right;  . Femoral-femoral bypass graft Bilateral 12/31/2014    Procedure: REMOVAL INFECTED RIGHT FEMORAL ARTERY TO LEFT FEMORAL ARTERY BYPASS GRAFT;  Surgeon: Conrad Sinton, MD;  Location: [place];  Service: Vascular;  Laterality: Bilateral;  . Axillary-femoral bypass graft Left 12/31/2014    Procedure: LEFT  AXILLA RY ARTERY TO LEFT FEMORAL ARTERY BYPASS GRAFT;  Surgeon: Conrad Wildwood, MD;  Location: [place];  Service: Vascular;  Laterality: Left;  . Patch angioplasty Right 12/31/2014    Procedure: RIGHT FEMORAL ARTERY PATCH ANGIOPLASTY USING Rueben Bash BIOLOGIC PATCH;  Surgeon: Conrad [place], MD;  Location: [place];  Service: Vascular;  Laterality: Right;  . Intraoperative arteriogram Left 12/31/2014    Procedure: INTRA OPERATIVE ARTERIOGRAM;  Surgeon: Conrad Thermal, MD;  Location: [place];  Service: Vascular;  Laterality: Left;  . Wound exploration Left 01/11/2015    Procedure: LEFT AXILLARY WOUND EXPLORATION AND DRAINAGE OF SEROMA;  Surgeon: Conrad Kenne[place], MD;  Location: [place];  Service: Vascular;  Laterality: Left;  . Application of wound vac Left 01/11/2015    Procedure: APPLICATION OF NEGATIVE PRESSURE WOUND DRESSING TO LEFT AXILLA  AND LEFT GROIN;  Surgeon: Conrad , MD;  Location: [place];  Service: Vascular;  Laterality: Left;  . Ovarian cyst removal      Social History: Patient does lives in [place] by herself.  Denies any alcohol use. She quit smoking 10 years ago. Denies any illicit drug use. Uses a walker to ambulate.  Family History:  Family History  Problem Relation Age of Onset  . COPD Mother   . Hyperlipidemia Mother   . Hypertension Maternal Grandmother   . Stroke Maternal Grandmother   . Colon cancer Neg Hx      Review of Systems - History obtained from the patient General ROS: positive for  - fatigue Psychological ROS: positive for - anxiety Ophthalmic ROS: negative ENT ROS: negative Allergy and Immunology ROS: negative Hematological and Lymphatic ROS: negative Endocrine ROS: negative Respiratory ROS: no cough, shortness of breath, or wheezing Cardiovascular ROS: no chest pain or dyspnea on exertion Gastrointestinal ROS: as in hpi Genito-Urinary ROS: no dysuria, trouble voiding, or hematuria Musculoskeletal ROS: negative Neurological ROS: no TIA or stroke symptoms Dermatological ROS: negative  Physical Examination  Filed Vitals:   06/07/15 0914 06/07/15 1137  BP: 132/80 154/70  Pulse: 109 84  Temp: 98.2 F (36.8 C)   TempSrc: Oral   Resp: 18 18  SpO2: 100% 100%    BP 154/70 mmHg  Pulse 84  Temp(Src) 98.2 F (36.8 C) (Oral)  Resp 18  SpO2 100%  General appearance: alert, cooperative, appears stated age and no distress Head: Normocephalic, without obvious abnormality, atraumatic Eyes: conjunctivae/corneas clear. PERRL, EOM's intact.  Throat: Dry mucous membranes Neck: no adenopathy, no carotid bruit, no JVD, supple, symmetrical, trachea midline and thyroid not enlarged, symmetric, no tenderness/mass/nodules Resp: clear to auscultation bilaterally Cardio: regular rate and rhythm, S1, S2 normal, no murmur, click, rub or gallop GI: Abdomen is soft, tender diffusely without any rebound, rigidity or guarding. No masses or organomegaly. Bowel sounds are sluggish Extremities: Right foot covered in dressing Pulses: Poorly palpable but present Skin: Skin color,  texture, turgor normal. No rashes or lesions Lymph nodes: Cervical, supraclavicular, and axillary nodes normal. Neurologic: Awake and alert. Oriented 3. Cranial nerves II through XII intact. Motor strength equal bilateral upper and lower extremities.  Laboratory Data: Results for orders placed or performed during the hospital encounter of 06/07/15 (from the past 48 hour(s))  CBC with Differential/Platelet     Status: Abnormal   Collection Time: 06/07/15  9:55 AM  Result Value Ref Range   WBC 11.7 (H) 4.0 - 10.5 K/uL   RBC 4.77 3.87 - 5.11 MIL/uL   Hemoglobin 13.4 12.0 - 15.0 g/dL   HCT 40.0 36.0 - 46.0 %   MCV 83.9 78.0 - 100.0 fL   MCH 28.1 26.0 - 34.0 pg   MCHC 33.5 30.0 - 36.0 g/dL   RDW 21.5 (H) 11.5 - 15.5 %   Platelets 243 150 - 400 K/uL   Neutrophils Relative % 76 %   Lymphocytes Relative 18 %   Monocytes Relative 6 %   Eosinophils Relative 0 %   Basophils Relative 0 %   Neutro Abs 8.9 (H) 1.7 - 7.7 K/uL   Lymphs Abs 2.1 0.7 - 4.0 K/uL   Monocytes Absolute 0.7 0.1 - 1.0 K/uL   Eosinophils Absolute 0.0 0.0 - 0.7 K/uL   Basophils Absolute 0.0 0.0 - 0.1 K/uL   RBC Morphology TARGET CELLS   Comprehensive metabolic panel     Status: Abnormal   Collection Time: 06/07/15  9:55 AM  Result Value Ref Range   Sodium 142 135 - 145 mmol/L   Potassium 3.4 (L) 3.5 - 5.1 mmol/L   Chloride 104 101 - 111 mmol/L   CO2 27 22 - 32 mmol/L   Glucose, Bld 97 65 - 99 mg/dL   BUN 19 6 - 20 mg/dL   Creatinine, Ser 0.76 0.44 - 1.00 mg/dL   Calcium 9.1 8.9 - 10.3 mg/dL   Total Protein 7.7 6.5 - 8.1 g/dL   Albumin 4.1 3.5 - 5.0 g/dL   AST 32 15 - 41 U/L   ALT 23 14 - 54 U/L   Alkaline Phosphatase 90 38 - 126 U/L   Total Bilirubin 0.5 0.3 - 1.2 mg/dL   GFR calc non Af Amer >60 >60 mL/min   GFR calc Af Amer >60 >60 mL/min    Comment: (NOTE) The eGFR has been calculated using the CKD EPI equation. This calculation has not been validated in all clinical situations. eGFR's persistently <60  mL/min signify possible Chronic Kidney Disease.    Anion gap 11 5 - 15  Protime-INR     Status: None   Collection Time: 06/07/15  9:55 AM  Result Value Ref Range   Prothrombin Time 13.2 11.6 - 15.2 seconds   INR 0.98 0.00 - 1.49  Urinalysis, Routine w reflex microscopic (not at [facility])     Status: Abnormal   Collection Time: 06/07/15 10:45 AM  Result Value Ref Range   Color, Urine YELLOW YELLOW   APPearance CLEAR CLEAR   Specific Gravity, Urine 1.038 (H) 1.005 - 1.030   pH 6.0 5.0 - 8.0   Glucose, UA NEGATIVE NEGATIVE mg/dL   Hgb urine dipstick MODERATE (A) NEGATIVE   Bilirubin Urine NEGATIVE NEGATIVE   Ketones, ur NEGATIVE NEGATIVE mg/dL   Protein, ur NEGATIVE NEGATIVE mg/dL   Nitrite NEGATIVE NEGATIVE   Leukocytes, UA NEGATIVE NEGATIVE  Urine microscopic-add on     Status: Abnormal   Collection Time: 06/07/15 10:45 AM  Result Value Ref Range  Squamous Epithelial / LPF 0-5 (A) NONE SEEN   WBC, UA 6-30 0 - 5 WBC/hpf   RBC / HPF TOO NUMEROUS TO COUNT 0 - 5 RBC/hpf   Bacteria, UA RARE (A) NONE SEEN   Urine-Other MUCOUS PRESENT     Radiology Reports: Ct Cta Abd/pel W/cm &/or W/o Cm  06/07/2015  CLINICAL DATA:  Lower abdominal pain since yesterday morning, nausea, vomiting, emesis coffee brown in color, 20 pound weight loss over 2 years, diffuse abdominal tenderness, history peripheral arterial disease post abdominal aortic endovascular stent graft placement, COPD, former smoker EXAM: CTA ABDOMEN AND PELVIS wITHOUT AND WITH CONTRAST TECHNIQUE: Multidetector CT imaging of the abdomen and pelvis was performed using the standard protocol during bolus administration of intravenous contrast. Multiplanar reconstructed images and MIPs were obtained and reviewed to evaluate the vascular anatomy. CONTRAST:  156m OMNIPAQUE IOHEXOL 350 MG/ML SOLN COMPARISON:  None. FINDINGS: Emphysematous changes at lung bases with LEFT basilar scarring. Small pericardial effusion. BILATERAL renal cysts. 15 x 10  mm nodule at posterior aspect upper pole LEFT kidney, concerning for renal neoplasm. Multiple BILATERAL nonobstructing renal calculi largest 10 mm diameter RIGHT kidney image 41. Liver, gallbladder, spleen, pancreas, kidneys, and adrenal glands otherwise unremarkable. Post antrectomy and gastrojejunostomy. Dilated small bowel up to 7.6 cm diameter. Mid small bowel obstruction identified secondary to an entero-enteric intussusception in the central pelvis ; wall thickening at the distal intussusceptum suspicious for mass. Distal small bowel loops and colon appear decompressed. Small amount of nonspecific free intraperitoneal fluid. Focal fluid attenuation collection in RIGHT pelvis 2.7 x 2.1 cm image 79 question ovarian cyst. Uterus surgically absent. Bones demineralized with degenerative disc disease changes at L4-L5. Extensive atherosclerotic calcifications. Prior endoluminal stenting of the abdominal aorta extending into common iliac arteries. Thrombus is seen within the stent at the distal aorta extending into the common iliac arteries bilaterally. Marked narrowing of RIGHT common iliac artery at its origin. Significant thrombus within LEFT common and external iliac arteries with lack of contrast opacification of the LEFT external iliac artery consistent with thrombosis. Patent LEFT axillofemoral bypass graft. LEFT internal iliac artery appears occluded though with distal filling of branches by collaterals. Significant narrowing of the RIGHT common femoral artery with suspected occlusion of the proximal RIGHT SFA. Occluded LEFT common femoral artery. No evidence of periaortic hemorrhage. Review of the MIP images confirms the above findings. IMPRESSION: Enteroenteric intussusception at the mid small bowel with secondary small bowel obstruction. Suspected lead point/mass within small bowel as eitiology. No evidence of bowel perforation. Extensive atherosclerotic disease post aortoiliac stent graft placement and  LEFT axillofemoral bypass grafting. BILATERAL nonobstructing renal calculi and renal cysts. 1.5 x 1.0 cm diameter potential in neoplasm at upper pole LEFT kidney. Small pericardial effusion. Findings called to Dr. NKathrynn Humbleon 06/07/2015 at 1119 hours. Electronically Signed   By: MLavonia DanaM.D.   On: 06/07/2015 11:22    My interpretation of Electrocardiogram: EKG is pending  Problem List  Principal Problem:   SBO (small bowel obstruction) (HCC) Active Problems:   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 [id number]  History of pulmonary embolism   PAD (peripheral artery disease) (HCC)   Abnormal finding on mammography   Long term (current) use of anticoagulants [Z79.01]   Assessment: This is a 70year old African-American female with past medical history as stated earlier, who presents with nausea, vomiting and abdominal pain. CT scan reveals enteroenteric intussusception with small bowel obstruction. There is a concern for leading mass.  Plan: #1 Small bowel obstruction with evidence for enteroenteric intussusception: Patient will be admitted to the hospital. General surgery has been consulted by ED physician. We will defer further management to them. They plan to take her to the OR today.  #2 History of peripheral vascular disease: She is followed closely by vascular surgery. She does have arterial insufficiency in the right lower extremity for which intervention is being planned in near the future. She had an extensive surgery back in September 2016 for infection of her femoral-femoral graft. She underwent bovine patch angioplasty of bilateral common femoral arteries. She underwent left axilloprofunda bypass.  #3 right ankle wound: She was supposed to undergo surgery tomorrow by Dr. Migdalia Dk. Called Dr. Migdalia Dk and left a message. Wound care nurse will be consulted.  #4 history of pulmonary embolism on warfarin: Her PE was back in 2014. However, she was  continued on warfarin due to her severe PAD. She is currently getting Lovenox injections as a bridging treatment till she undergo surgery tomorrow. However, considering these new issues that surgery will most likely be postponed. Patient may require IV heparin bridging, while she is in the hospital. General surgery plans to take her to OR. They will let us know when it will be ok to start IV heparin for bridging purposes.   #5 abnormal mammogram: Mammogram in August revealed some calcifications in the right breast. She was supposed to follow-up for the same in September. However, due to her vascular surgery, this did happen. This will need outpatient follow-up. No obvious masses felt on examination today.  Bilateral renal cysts and possible left renal mass noted on CT. Will need outpatient monitoring.  DVT Prophylaxis: She received 40 mg of further Lovenox as part of for bridging treatment this morning. We will need to decide on IV heparin for bridging purposes based on input from general surgery after surgery.  Code Status: Full code Family Communication: Discussed with the patient  Disposition Plan: Admit to MedSurg   Further management decisions will depend on results of further testing and patient's response to treatment.   [facility]  [facility] Pager [phone]  If 7PM-7AM, please contact night-coverage [email or web address] Password [facility]  06/07/2015, 12:34 PM

## 2015-06-07 NOTE — Anesthesia Preprocedure Evaluation (Signed)
Anesthesia Evaluation  Patient identified by MRN, date of birth, ID band Patient awake    Reviewed: Allergy & Precautions, NPO status , Patient's Chart, lab work & pertinent test results  Airway Mallampati: II  TM Distance: >3 FB Neck ROM: Full    Dental no notable dental hx.    Pulmonary COPD, former smoker,    Pulmonary exam normal breath sounds clear to auscultation       Cardiovascular + Peripheral Vascular Disease  Normal cardiovascular exam Rhythm:Regular Rate:Normal     Neuro/Psych negative neurological ROS  negative psych ROS   GI/Hepatic Neg liver ROS, GERD  Medicated,  Endo/Other  negative endocrine ROS  Renal/GU negative Renal ROS  negative genitourinary   Musculoskeletal  (+) Fibromyalgia -  Abdominal   Peds negative pediatric ROS (+)  Hematology  (+) anemia ,   Anesthesia Other Findings   Reproductive/Obstetrics negative OB ROS                             Anesthesia Physical  Anesthesia Plan  ASA: III and emergent  Anesthesia Plan: General   Post-op Pain Management:    Induction: Intravenous, Rapid sequence and Cricoid pressure planned  Airway Management Planned: Oral ETT  Additional Equipment:   Intra-op Plan:   Post-operative Plan:   Informed Consent: I have reviewed the patients History and Physical, chart, labs and discussed the procedure including the risks, benefits and alternatives for the proposed anesthesia with the patient or authorized representative who has indicated his/her understanding and acceptance.   Dental advisory given  Plan Discussed with: CRNA and Surgeon  Anesthesia Plan Comments:         Anesthesia Quick Evaluation

## 2015-06-08 ENCOUNTER — Ambulatory Visit (HOSPITAL_COMMUNITY): Admit: 2015-06-08 | Payer: Medicare Other | Admitting: Plastic Surgery

## 2015-06-08 ENCOUNTER — Encounter (HOSPITAL_COMMUNITY): Payer: Self-pay | Admitting: General Surgery

## 2015-06-08 ENCOUNTER — Encounter (HOSPITAL_COMMUNITY): Payer: Self-pay

## 2015-06-08 DIAGNOSIS — K5669 Other intestinal obstruction: Secondary | ICD-10-CM

## 2015-06-08 DIAGNOSIS — D72829 Elevated white blood cell count, unspecified: Secondary | ICD-10-CM | POA: Diagnosis present

## 2015-06-08 LAB — COMPREHENSIVE METABOLIC PANEL
ALT: 20 U/L (ref 14–54)
ANION GAP: 10 (ref 5–15)
AST: 25 U/L (ref 15–41)
Albumin: 3.1 g/dL — ABNORMAL LOW (ref 3.5–5.0)
Alkaline Phosphatase: 67 U/L (ref 38–126)
BUN: 14 mg/dL (ref 6–20)
CHLORIDE: 109 mmol/L (ref 101–111)
CO2: 24 mmol/L (ref 22–32)
Calcium: 8.3 mg/dL — ABNORMAL LOW (ref 8.9–10.3)
Creatinine, Ser: 0.49 mg/dL (ref 0.44–1.00)
GLUCOSE: 109 mg/dL — AB (ref 65–99)
POTASSIUM: 3.1 mmol/L — AB (ref 3.5–5.1)
Sodium: 143 mmol/L (ref 135–145)
TOTAL PROTEIN: 6.3 g/dL — AB (ref 6.5–8.1)
Total Bilirubin: 0.7 mg/dL (ref 0.3–1.2)

## 2015-06-08 LAB — CBC
HCT: 33.7 % — ABNORMAL LOW (ref 36.0–46.0)
HEMOGLOBIN: 11.3 g/dL — AB (ref 12.0–15.0)
MCH: 28 pg (ref 26.0–34.0)
MCHC: 33.5 g/dL (ref 30.0–36.0)
MCV: 83.6 fL (ref 78.0–100.0)
PLATELETS: 158 10*3/uL (ref 150–400)
RBC: 4.03 MIL/uL (ref 3.87–5.11)
RDW: 21.3 % — ABNORMAL HIGH (ref 11.5–15.5)
WBC: 20.1 10*3/uL — ABNORMAL HIGH (ref 4.0–10.5)

## 2015-06-08 LAB — TYPE AND SCREEN
ABO/RH(D): B POS
Antibody Screen: NEGATIVE

## 2015-06-08 LAB — MRSA PCR SCREENING: MRSA by PCR: NEGATIVE

## 2015-06-08 LAB — MAGNESIUM: MAGNESIUM: 1.5 mg/dL — AB (ref 1.7–2.4)

## 2015-06-08 LAB — PHOSPHORUS: PHOSPHORUS: 3.1 mg/dL (ref 2.5–4.6)

## 2015-06-08 SURGERY — IRRIGATION AND DEBRIDEMENT WOUND
Anesthesia: General | Laterality: Right

## 2015-06-08 MED ORDER — PANTOPRAZOLE SODIUM 40 MG IV SOLR
40.0000 mg | INTRAVENOUS | Status: DC
  Administered 2015-06-08 – 2015-06-14 (×7): 40 mg via INTRAVENOUS
  Filled 2015-06-08 (×7): qty 40

## 2015-06-08 MED ORDER — POTASSIUM CHLORIDE 10 MEQ/100ML IV SOLN
10.0000 meq | INTRAVENOUS | Status: AC
  Administered 2015-06-08 (×3): 10 meq via INTRAVENOUS
  Filled 2015-06-08 (×3): qty 100

## 2015-06-08 NOTE — Progress Notes (Signed)
Date: June 08, 2015 Chart reviewed for concurrent status and case management needs. Will continue to follow patient for changes and needs: sbo with intussusception, exp lap [id number]=20.1 Velva Harman, BSN, [place], [place]   [phone]

## 2015-06-08 NOTE — Consult Note (Signed)
WOC wound consult note Reason for Consult: Nonhealing ulcer to right dorsal foot with exposed tendon.  Skin graft planned for 2//16/17.  Will defer to surgery at this time. Will continue with vaseline gauze to the wound to promote moisture.   Wound type:Chronic nonhealing wound Pressure Ulcer POA: N/A Measurement: 4 cm x 1.5 cm x 0.2 cm with exposed tendon.  Wound [id number] pink with exposed tendon.  Drainage (amount, consistency, odor) Scant serous drainage.  No odor.  Periwound:Intact Dressing procedure/placement/frequency: Cleanse wound with NS and pat gently dry.  Apply vaseline gauze to wound bed.  COver with kerlix and tape.  Change daily.  Graft to be applied 06/09/15.  Will defer to surgery at that time.  Will not follow at this time.  Please re-consult if needed.  Domenic Moras RN BSN [place] Pager [phone]

## 2015-06-08 NOTE — Progress Notes (Signed)
Initial Nutrition Assessment  DOCUMENTATION CODES:   Severe malnutrition in context of chronic illness, Underweight  INTERVENTION:  -RD to continue to monitor for needs   NUTRITION DIAGNOSIS:   Malnutrition related to chronic illness as evidenced by severe depletion of muscle mass, severe depletion of body fat.  GOAL:   Patient will meet greater than or equal to 90% of their needs  MONITOR:   I & O's, Diet advancement, Labs, Supplement acceptance, Skin  REASON FOR ASSESSMENT:   Malnutrition Screening Tool    ASSESSMENT:   Cynthia Bowen is a 70 y.o. female with a past medical history of peripheral vascular disease, right lower extremity chronic wound, history of pulmonary embolism on warfarin, previous history of gastric perforation requiring surgery and omental patch, who presented to the hospital with complaints of nausea, vomiting and abdominal pain ongoing for 2 days  Spoke with pt briefly at bedside. She did not talk a lot but was able to answer questions.  Stated that she hasn't eaten much in 1-2 months related to general weakness/malaise and abdominal pain, poor appetite.  According to MD note, she lost 20# in 2 years. Per chart, her weight has been relatively stable over he past 2 months.  Nutrition-Focused physical exam completed. Findings are severe fat depletion, severe muscle depletion, and no edema.   Labs:  CBGs 100-137, K 3.1, Ca 8.3 Medications reviewed.  Diet Order:  Diet NPO time specified  Skin:   Closed wounds to RLE and Abdomen.  Last BM:  PTA  Height:   Ht Readings from Last 1 Encounters:  06/07/15 5\' 5"  (1.651 m)    Weight:   Wt Readings from Last 1 Encounters:  06/07/15 88 lb 6.5 oz (40.1 kg)    Ideal Body Weight:  56.81 kg  BMI:  Body mass index is 14.71 kg/(m^2).  Estimated Nutritional Needs:   Kcal:  1400-1700 calories  Protein:  60-70 grams  Fluid:  >/= 1.4L  EDUCATION NEEDS:   No education needs identified at this  time  Satira Anis. Vito Beg, MS, RD LDN After Hours/Weekend Pager [phone]

## 2015-06-08 NOTE — Progress Notes (Signed)
1 Day Post-Op  Subjective: She looks fine, complains of pain, but is obviously better.  She has chronic pain along with her acute issues.  No BS, NG working, still has that very dark colored drainage.  i flushed it and it seems to clear.  I took down the RLE wound, and it is an open clean wound, down to muscle and ligament.  Will start dressing changes today, I have done one this AM.  Objective: Vital signs in last 24 hours: Temp:  [97.7 F (36.5 C)-98.2 F (36.8 C)] 98.1 F (36.7 C) (02/15 0349) Pulse Rate:  [34-109] 87 (02/15 0730) Resp:  [8-22] 20 (02/15 0730) BP: (132-197)/(59-92) 159/82 mmHg (02/15 0730) SpO2:  [97 %-100 %] 97 % (02/15 0730) Weight:  [40.1 kg (88 lb 6.5 oz)] 40.1 kg (88 lb 6.5 oz) (02/14 2039)   Urine 1175 Emesis:  100 Afebrile, VSS K+ 3.1, Mag 1.5 WBC 20.1 Intake/Output from previous day: 02/14 0701 - 02/15 0700 In: 2793.3 [I.V.:2543.3; IV Piggyback:250] Out: [id number]; Emesis/NG output:100; Blood:75] Intake/Output this shift:    General appearance: alert, cooperative and no distress Resp: clear to auscultation bilaterally GI: soft sore, dressing in place, no Bowel sounds. Skin: open wound RLE, I did not measure but its about 10 x 4 cm with open muscle and ligament visible.  it is clean, no cellulitis.  Lab Results:   Recent Labs  06/07/15 0955 06/08/15 0308  WBC 11.7* 20.1*  HGB 13.4 11.3*  HCT 40.0 33.7*  PLT 243 158    BMET  Recent Labs  06/07/15 0955 06/08/15 0308  NA 142 143  K 3.4* 3.1*  CL 104 109  CO2 27 24  GLUCOSE 97 109*  BUN 19 14  CREATININE 0.76 0.49  CALCIUM 9.1 8.3*   PT/INR  Recent Labs  06/07/15 0955  LABPROT 13.2  INR 0.98     Recent Labs Lab 06/06/15 1419 06/06/15 1527 06/07/15 0955 06/08/15 0308  AST 29 26 32 25  ALT 21 20 23 20   ALKPHOS 97 93 90 67  BILITOT 0.4 0.5 0.5 0.7  PROT 7.7 7.4 7.7 6.3*  ALBUMIN 4.0 3.6 4.1 3.1*     Lipase     Component Value Date/Time   LIPASE 17  06/06/2015 1527     Studies/Results: Ct Cta Abd/pel W/cm &/or W/o Cm  06/07/2015  CLINICAL DATA:  Lower abdominal pain since yesterday morning, nausea, vomiting, emesis coffee brown in color, 20 pound weight loss over 2 years, diffuse abdominal tenderness, history peripheral arterial disease post abdominal aortic endovascular stent graft placement, COPD, former smoker EXAM: CTA ABDOMEN AND PELVIS wITHOUT AND WITH CONTRAST TECHNIQUE: Multidetector CT imaging of the abdomen and pelvis was performed using the standard protocol during bolus administration of intravenous contrast. Multiplanar reconstructed images and MIPs were obtained and reviewed to evaluate the vascular anatomy. CONTRAST:  138mL OMNIPAQUE IOHEXOL 350 MG/ML SOLN COMPARISON:  None. FINDINGS: Emphysematous changes at lung bases with LEFT basilar scarring. Small pericardial effusion. BILATERAL renal cysts. 15 x 10 mm nodule at posterior aspect upper pole LEFT kidney, concerning for renal neoplasm. Multiple BILATERAL nonobstructing renal calculi largest 10 mm diameter RIGHT kidney image 41. Liver, gallbladder, spleen, pancreas, kidneys, and adrenal glands otherwise unremarkable. Post antrectomy and gastrojejunostomy. Dilated small bowel up to 7.6 cm diameter. Mid small bowel obstruction identified secondary to an entero-enteric intussusception in the central pelvis ; wall thickening at the distal intussusceptum suspicious for mass. Distal small bowel loops and colon appear decompressed. Small  amount of nonspecific free intraperitoneal fluid. Focal fluid attenuation collection in RIGHT pelvis 2.7 x 2.1 cm image 79 question ovarian cyst. Uterus surgically absent. Bones demineralized with degenerative disc disease changes at L4-L5. Extensive atherosclerotic calcifications. Prior endoluminal stenting of the abdominal aorta extending into common iliac arteries. Thrombus is seen within the stent at the distal aorta extending into the common iliac arteries  bilaterally. Marked narrowing of RIGHT common iliac artery at its origin. Significant thrombus within LEFT common and external iliac arteries with lack of contrast opacification of the LEFT external iliac artery consistent with thrombosis. Patent LEFT axillofemoral bypass graft. LEFT internal iliac artery appears occluded though with distal filling of branches by collaterals. Significant narrowing of the RIGHT common femoral artery with suspected occlusion of the proximal RIGHT SFA. Occluded LEFT common femoral artery. No evidence of periaortic hemorrhage. Review of the MIP images confirms the above findings. IMPRESSION: Enteroenteric intussusception at the mid small bowel with secondary small bowel obstruction. Suspected lead point/mass within small bowel as eitiology. No evidence of bowel perforation. Extensive atherosclerotic disease post aortoiliac stent graft placement and LEFT axillofemoral bypass grafting. BILATERAL nonobstructing renal calculi and renal cysts. 1.5 x 1.0 cm diameter potential in neoplasm at upper pole LEFT kidney. Small pericardial effusion. Findings called to Dr. Kathrynn Humble on 06/07/2015 at 1119 hours. Electronically Signed   By: Lavonia Dana M.D.   On: 06/07/2015 11:22    Medications: . heparin  5,000 Units Subcutaneous 3 times per day  . potassium chloride  10 mEq Intravenous Q1 Hr x 3    Assessment/Plan Intussuception of small bowel  Small bowel resection with anastomosis, lysis of adhesions, 06/07/15, Dr. 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 Chronic abdominal pain/fibromyalgia PE and significant vascular disease on chronic anticoagulation (currently on Lovenox bridging last dose this AM) COPD/ 40 year hx of tobacco use, quit 10 years ago. PVD with AAA endovascular stent and left to right Axillary to fem-fem bypass, 12/31/14, removal of of old Fem-Fem  bypass Bilateral non obstructing renal calculi Left 1.5 x 1.0 renal mass, potential neoplasm Multiple medicine allergies (16 listed) Non healing ulcer RLE for skin grafting tomorrow with plastics Antibiotics:  None DVT:  Heparin/SCD/ will check on when we can fully anticoagulate, probably in AM.   Daily labs ordered   Plan:  OOB to chair today, continue NG till bowel function returns, IS, start dressing changes, I will check on when we can resume full anticoagulation.        LOS: 1 day    Trayshawn Durkin 06/08/2015

## 2015-06-08 NOTE — Anesthesia Postprocedure Evaluation (Signed)
Anesthesia Post Note  Patient: Cynthia Bowen  Procedure(s) Performed: Procedure(s) (LRB): EXPLORATORY LAPAROTOMY, LYSIS OF ADHESIONS,  WITH BOWEL RESECTION OFR SMALL BOWEL INTUSSUSCEPTION (N/A)  Patient location during evaluation: PACU Anesthesia Type: General Level of consciousness: awake and alert Pain management: pain level controlled Vital Signs Assessment: post-procedure vital signs reviewed and stable Respiratory status: spontaneous breathing, nonlabored ventilation, respiratory function stable and patient connected to nasal cannula oxygen Cardiovascular status: blood pressure returned to baseline and stable Postop Assessment: no signs of nausea or vomiting Anesthetic complications: no    Last Vitals:  Filed Vitals:   06/08/15 1157 06/08/15 1200  BP: 158/73   Pulse: 107   Temp:  37 C  Resp: 19     Last Pain:  Filed Vitals:   06/08/15 1235  PainSc: 8                  Montez Hageman

## 2015-06-08 NOTE — Progress Notes (Signed)
[facility] PROGRESS NOTE  Cynthia Bowen [id number] DOB: 15-Mar-1946 DOA: 06/07/2015 PCP: [name] Koyanagi, MD  Summary 06/08/15: I have seen and examined Cynthia Bowen at bedside and reviewed her chart. Appreciate general surgery. Spoke with Dr. Kieth Brightly.Cynthia Bowen is a pleasant 70 year old female with history of PE/anticoagulation on hold perioperatively, who was admitted with intussusception status post bowel resection with reanastomosis on 06/07/15. She complains of pain and wonders if she can have something to drink. Not passing gas yet. Her white count is elevated and this is concerning, hopefully all reactionary. Will have low threshold to add antibiotics if suggestion of infection. Will add Protonix meanwhile, transfer to telemetry when bed becomes available and defer rest of management to general surgery.  Plan SBO (small bowel obstruction) (HCC)/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 March 2014/Intussusception intestine (HCC)/Leukocytosis  Has NG tube/nothing by mouth  Defer management to general surgery  Low threshold for antibiotics if evidence of infection History of pulmonary embolism/Long term (current) use of anticoagulants [Z79.01]  Anticoagulation on hold perioperatively  Resume Lovenox bridge when it's okay with general surgery PAD (peripheral artery disease) (HCC)/Abnormal finding on mammography  No acute changes  Monitor Hypokalemia  Replenish potassium as necessary Code Status: Full code Family Communication: None at bedside Disposition Plan: Eventual discharge plan to be decided depending on clinical progress, transfer to telemetry when bed becomes available   Consultants:  Gen. surgery  Procedures:  Bowel resection as is with reanastomosis on 06/07/15  Antibiotics:  None  HPI/Subjective: Complains of abdominal pain, would like to eat, no bowel movement or passing gas  Objective: Filed Vitals:   06/08/15 1200 06/08/15 1300  BP:  146/69  Pulse:  84  Temp: 98.6 F (37 C) 98.8 F (37.1 C)  Resp:  20    Intake/Output Summary (Last 24 hours) at 06/08/15 1822 Last data filed at 06/08/15 1712  Gross per 24 hour  Intake 1743.33 ml  Output   2045 ml  Net -301.67 ml   Filed Weights   06/07/15 2039  Weight: 40.1 kg (88 lb 6.5 oz)    Exam:   General:  Comfortable at rest. NG tube in place  Cardiovascular: S1-S2 normal. No murmurs. Pulse regular.  Respiratory: Good air entry bilaterally. No rhonchi or rales.  Abdomen: Fresh surgical scar, no bleed.  Musculoskeletal: No pedal edema   Neurological: Intact  Data Reviewed: Basic Metabolic Panel:  Recent Labs Lab 06/06/15 1419 06/06/15 1527 06/07/15 0955 06/08/15 0308  NA 141 141 142 143  K 3.8 3.3* 3.4* 3.1*  CL 104 107 104 109  CO2 27 26 27 24   GLUCOSE 113* 115* 97 109*  BUN 18 19 19 14   CREATININE 0.66 0.69 0.76 0.49  CALCIUM 9.7 8.7* 9.1 8.3*  MG  --   --   --  1.5*  PHOS  --   --   --  3.1   Liver Function Tests:  Recent Labs Lab 06/06/15 1419 06/06/15 1527 06/07/15 0955 06/08/15 0308  AST 29 26 32 25  ALT 21 20 23 20   ALKPHOS 97 93 90 67  BILITOT 0.4 0.5 0.5 0.7  PROT 7.7 7.4 7.7 6.3*  ALBUMIN 4.0 3.6 4.1 3.1*    Recent Labs Lab 06/06/15 1527  LIPASE 17   No results for input(s): AMMONIA in the last 168 hours. CBC:  Recent Labs Lab 06/06/15 1423 06/06/15 1527 06/07/15 0955 06/08/15 0308  WBC 15.2* 15.1* 11.7* 20.1*  NEUTROABS  --   --  8.9*  --   HGB 14.5 13.0 13.4 11.3*  HCT 43.3 38.6 40.0 33.7*  MCV 83.2 83.4 83.9 83.6  PLT  --  247 243 158   Cardiac Enzymes: No results for input(s): CKTOTAL, CKMB, CKMBINDEX, TROPONINI in the last 168 hours. BNP (last 3 results) No results for input(s): BNP in the last 8760 hours.  ProBNP (last 3 results) No results for input(s): PROBNP in the last 8760 hours.  CBG: No results for input(s): GLUCAP in the last 168 hours.  Recent  Results (from the past 240 hour(s))  MRSA PCR Screening     Status: None   Collection Time: 06/07/15  9:00 PM  Result Value Ref Range Status   MRSA by PCR NEGATIVE NEGATIVE Final    Comment:        The GeneXpert MRSA Assay (FDA approved for NASAL specimens only), is one component of a comprehensive MRSA colonization surveillance program. It is not intended to diagnose MRSA infection nor to guide or monitor treatment for MRSA infections.      Studies: Ct Cta Abd/pel W/cm &/or W/o Cm  06/07/2015  CLINICAL DATA:  Lower abdominal pain since yesterday morning, nausea, vomiting, emesis coffee brown in color, 20 pound weight loss over 2 years, diffuse abdominal tenderness, history peripheral arterial disease post abdominal aortic endovascular stent graft placement, COPD, former smoker EXAM: CTA ABDOMEN AND PELVIS wITHOUT AND WITH CONTRAST TECHNIQUE: Multidetector CT imaging of the abdomen and pelvis was performed using the standard protocol during bolus administration of intravenous contrast. Multiplanar reconstructed images and MIPs were obtained and reviewed to evaluate the vascular anatomy. CONTRAST:  165mL OMNIPAQUE IOHEXOL 350 MG/ML SOLN COMPARISON:  None. FINDINGS: Emphysematous changes at lung bases with LEFT basilar scarring. Small pericardial effusion. BILATERAL renal cysts. 15 x 10 mm nodule at posterior aspect upper pole LEFT kidney, concerning for renal neoplasm. Multiple BILATERAL nonobstructing renal calculi largest 10 mm diameter RIGHT kidney image 41. Liver, gallbladder, spleen, pancreas, kidneys, and adrenal glands otherwise unremarkable. Post antrectomy and gastrojejunostomy. Dilated small bowel up to 7.6 cm diameter. Mid small bowel obstruction identified secondary to an entero-enteric intussusception in the central pelvis ; wall thickening at the distal intussusceptum suspicious for mass. Distal small bowel loops and colon appear decompressed. Small amount of nonspecific free  intraperitoneal fluid. Focal fluid attenuation collection in RIGHT pelvis 2.7 x 2.1 cm image 79 question ovarian cyst. Uterus surgically absent. Bones demineralized with degenerative disc disease changes at L4-L5. Extensive atherosclerotic calcifications. Prior endoluminal stenting of the abdominal aorta extending into common iliac arteries. Thrombus is seen within the stent at the distal aorta extending into the common iliac arteries bilaterally. Marked narrowing of RIGHT common iliac artery at its origin. Significant thrombus within LEFT common and external iliac arteries with lack of contrast opacification of the LEFT external iliac artery consistent with thrombosis. Patent LEFT axillofemoral bypass graft. LEFT internal iliac artery appears occluded though with distal filling of branches by collaterals. Significant narrowing of the RIGHT common femoral artery with suspected occlusion of the proximal RIGHT SFA. Occluded LEFT common femoral artery. No evidence of periaortic hemorrhage. Review of the MIP images confirms the above findings. IMPRESSION: Enteroenteric intussusception at the mid small bowel with secondary small bowel obstruction. Suspected lead point/mass within small bowel as eitiology. No evidence of bowel perforation. Extensive atherosclerotic disease post aortoiliac stent graft placement and LEFT axillofemoral bypass grafting. BILATERAL nonobstructing renal calculi and renal cysts. 1.5 x 1.0 cm diameter potential in neoplasm at upper pole LEFT  kidney. Small pericardial effusion. Findings called to Dr. Kathrynn Humble on 06/07/2015 at 1119 hours. Electronically Signed   By: Lavonia Dana M.D.   On: 06/07/2015 11:22    Scheduled Meds: . heparin  5,000 Units Subcutaneous 3 times per day  . pantoprazole (PROTONIX) IV  40 mg Intravenous Q24H   Continuous Infusions: . 0.9 % NaCl with KCl 20 mEq / L 100 mL/hr at 06/08/15 0758     Time spent: 25 minutes    Kitti Mcclish  [facility] Pager  [phone]. If 7PM-7AM, please contact night-coverage at [email or web address], password [facility] 06/08/2015, 6:22 PM  LOS: 1 day

## 2015-06-08 NOTE — Care Management Note (Signed)
Case Management Note  Patient Details  Name: REDELL BERTHELOT MRN: [id number] Date of Birth: 1945/11/09  Subjective/Objective:       sbo with surgery and complicated post op             Action/Plan:Date: June 08, 2015 Chart reviewed for concurrent status and case management needs. Will continue to follow patient for changes and needs: Velva Harman, BSN, RN, [place]   [phone]   Expected Discharge Date:   (UNKNOWN)               Expected Discharge Plan:  Home/Self Care  In-House Referral:  NA  Discharge planning Services  CM Consult  Post Acute Care Choice:  NA Choice offered to:  NA  DME Arranged:    DME Agency:     HH Arranged:    [place] Agency:     Status of Service:  Completed, signed off  Medicare Important Message Given:  N/A - LOS <3 / Initial given by admissions Date Medicare IM Given:    Medicare IM give by:    Date Additional Medicare IM Given:    Additional Medicare Important Message give by:     If discussed at [place] of Stay Meetings, dates discussed:    Additional Comments:  Leeroy Cha, RN 06/08/2015, 8:48 AM

## 2015-06-09 ENCOUNTER — Inpatient Hospital Stay (HOSPITAL_COMMUNITY): Payer: Medicare Other

## 2015-06-09 DIAGNOSIS — J441 Chronic obstructive pulmonary disease with (acute) exacerbation: Secondary | ICD-10-CM | POA: Diagnosis present

## 2015-06-09 LAB — BASIC METABOLIC PANEL
Anion gap: 15 (ref 5–15)
BUN: 12 mg/dL (ref 6–20)
CHLORIDE: 114 mmol/L — AB (ref 101–111)
CO2: 20 mmol/L — ABNORMAL LOW (ref 22–32)
Calcium: 8.6 mg/dL — ABNORMAL LOW (ref 8.9–10.3)
Creatinine, Ser: 0.47 mg/dL (ref 0.44–1.00)
GFR calc Af Amer: >60 mL/min (ref >60)
GFR calc non Af Amer: >60 mL/min (ref >60)
GLUCOSE: 82 mg/dL (ref 65–99)
POTASSIUM: 3.7 mmol/L (ref 3.5–5.1)
Sodium: 149 mmol/L — ABNORMAL HIGH (ref 135–145)

## 2015-06-09 LAB — CBC
HEMATOCRIT: 30 % — AB (ref 36.0–46.0)
HEMOGLOBIN: 9.7 g/dL — AB (ref 12.0–15.0)
MCH: 27.9 pg (ref 26.0–34.0)
MCHC: 32.3 g/dL (ref 30.0–36.0)
MCV: 86.2 fL (ref 78.0–100.0)
Platelets: 188 10*3/uL (ref 150–400)
RBC: 3.48 MIL/uL — AB (ref 3.87–5.11)
RDW: 22.1 % — ABNORMAL HIGH (ref 11.5–15.5)
WBC: 13.2 10*3/uL — ABNORMAL HIGH (ref 4.0–10.5)

## 2015-06-09 LAB — MAGNESIUM: Magnesium: 2 mg/dL (ref 1.7–2.4)

## 2015-06-09 LAB — URINE CULTURE
CULTURE: NO GROWTH
SPECIAL REQUESTS: NORMAL

## 2015-06-09 LAB — PROTIME-INR
INR: 1.05 (ref 0.00–1.49)
PROTHROMBIN TIME: 13.9 s (ref 11.6–15.2)

## 2015-06-09 LAB — PHOSPHORUS: PHOSPHORUS: 1.9 mg/dL — AB (ref 2.5–4.6)

## 2015-06-09 MED ORDER — DEXTROSE 5 % IV SOLN
1.0000 g | Freq: Every day | INTRAVENOUS | Status: DC
  Administered 2015-06-09 – 2015-06-11 (×3): 1 g via INTRAVENOUS
  Filled 2015-06-09 (×3): qty 10

## 2015-06-09 MED ORDER — KCL IN DEXTROSE-NACL 30-5-0.45 MEQ/L-%-% IV SOLN
INTRAVENOUS | Status: DC
  Administered 2015-06-09 – 2015-06-13 (×9): via INTRAVENOUS
  Filled 2015-06-09 (×12): qty 1000

## 2015-06-09 MED ORDER — DEXTROSE 5 % IV SOLN
500.0000 mg | INTRAVENOUS | Status: AC
  Administered 2015-06-09 – 2015-06-11 (×3): 500 mg via INTRAVENOUS
  Filled 2015-06-09 (×3): qty 500

## 2015-06-09 MED ORDER — ALBUTEROL SULFATE (2.5 MG/3ML) 0.083% IN NEBU: 3.0000 mL | INHALATION_SOLUTION | RESPIRATORY_TRACT | Status: DC | PRN

## 2015-06-09 NOTE — Evaluation (Signed)
Physical Therapy Evaluation Patient Details Name: Cynthia Bowen MRN: [id number] DOB: July 27, 1945 Today's Date: 06/09/2015   History of Present Illness  70 y.o. female with a past medical history of peripheral vascular disease, right lower extremity chronic wound, history of pulmonary embolism on warfarin, previous history of gastric perforation requiring surgery and omental patch, who presented to the hospital with complaints of nausea, vomiting and abdominal pain and s/p small bowel resection with anastomosis, lysis of adhesions on 06/07/15  Clinical Impression  Pt admitted with above diagnosis. Pt currently with functional limitations due to the deficits listed below (see PT Problem List).  Pt will benefit from skilled PT to increase their independence and safety with mobility to allow discharge to the venue listed below.   Pt mobilizing well POD #2 however fatigues quickly.  Pt reports she provides care to her chronic wound R LE and mobilizes with RW.  Pt also reports plan for graft surgery.      Follow Up Recommendations Home health PT;Supervision for mobility/OOB    Equipment Recommendations  None recommended by PT    Recommendations for Other Services       Precautions / Restrictions Precautions Precautions: Fall Precaution Comments: Nonhealing ulcer to right dorsal foot with exposed tendon      Mobility  Bed Mobility Overal bed mobility: Needs Assistance Bed Mobility: Sit to Supine       Sit to supine: Supervision   General bed mobility comments: supervision for lines  Transfers Overall transfer level: Needs assistance Equipment used: Rolling walker (2 wheeled) Transfers: Sit to/from Stand Sit to Stand: Min guard         General transfer comment: verbal cues for safe technique  Ambulation/Gait Ambulation/Gait assistance: Min guard Ambulation Distance (Feet): 120 Feet Assistive device: Rolling walker (2 wheeled) Gait Pattern/deviations: Step-through  pattern;Decreased stride length;Trunk flexed     General Gait Details: verbal cues for safe use of RW, fatigues quickly  Stairs            Wheelchair Mobility    Modified Rankin (Stroke Patients Only)       Balance                                             Pertinent Vitals/Pain Pain Assessment: Faces Faces Pain Scale: Hurts even more Pain Location: abdomen Pain Descriptors / Indicators: Discomfort Pain Intervention(s): Monitored during session;Repositioned    Home Living Family/patient expects to be discharged to:: Private residence Living Arrangements: Alone Available Help at Discharge: Family;Available PRN/intermittently Type of Home: Apartment Home Access: Stairs to enter Entrance Stairs-Rails: None Entrance Stairs-Number of Steps: 1 Home Layout: One level Home Equipment: Walker - 2 wheels      Prior Function Level of Independence: Independent with assistive device(s)         Comments: mobilizing with RW     Hand Dominance        Extremity/Trunk Assessment               Lower Extremity Assessment: Generalized weakness (diffuse muscle atrophy)         Communication   Communication: No difficulties  Cognition Arousal/Alertness: Awake/alert Behavior During Therapy: Flat affect Overall Cognitive Status: Within Functional Limits for tasks assessed                      General Comments  Exercises        Assessment/Plan    PT Assessment Patient needs continued PT services  PT Diagnosis Difficulty walking;Acute pain   PT Problem List Decreased activity tolerance;Decreased balance;Decreased mobility;Decreased knowledge of use of DME;Pain;Decreased strength  PT Treatment Interventions DME instruction;Gait training;Functional mobility training;Therapeutic activities;Therapeutic exercise;Balance training;Patient/family education   PT Goals (Current goals can be found in the Care Plan section) Acute  Rehab PT Goals PT Goal Formulation: With patient Time For Goal Achievement: 06/23/15 Potential to Achieve Goals: Good    Frequency Min 3X/week   Barriers to discharge        Co-evaluation               End of Session   Activity Tolerance: Patient tolerated treatment well Patient left: with call bell/phone within reach;in bed;with bed alarm set           Time: [id number] PT Time Calculation (min) (ACUTE ONLY): 11 min   Charges:   PT Evaluation $PT Eval Low Complexity: 1 Procedure     PT G Codes:        Ivanka Kirshner,KATHrine E 06/09/2015, 2:31 PM Carmelia Bake, PT, DPT 06/09/2015 Pager: [phone]

## 2015-06-09 NOTE — Progress Notes (Signed)
2 Days Post-Op  Subjective: She is up in the chair and seems pretty happy.  Talked to someone on the phone and then ask for pain medicine.  Dressing is fine, not much in the way of BS, but she says she is having some flatus.    Objective: Vital signs in last 24 hours: Temp:  [98.5 F (36.9 C)-98.8 F (37.1 C)] 98.5 F (36.9 C) (02/15 2140) Pulse Rate:  [84-107] 85 (02/15 2140) Resp:  [19-20] 20 (02/15 2140) BP: (146-158)/(60-73) 150/60 mmHg (02/15 2140) SpO2:  [98 %-100 %] 98 % (02/15 2140)  NPO Urine 1620 NO VS since 1900 last PM,  NA 149 wBC coming down Intake/Output from previous day: 02/15 0701 - 02/16 0700 In: 1700 [I.V.:1600; IV Piggyback:100] Out: 1620 [Urine:1620] Intake/Output this shift:    General appearance: alert, cooperative and no distress Resp: clear to auscultation bilaterally GI: soft, up in chair, no distension, almost no BS.  No BM, + flatus Extremities: extremities normal, atraumatic, no cyanosis or edema  Lab Results:   Recent Labs  06/08/15 0308 06/09/15 0448  WBC 20.1* 13.2*  HGB 11.3* 9.7*  HCT 33.7* 30.0*  PLT 158 188    BMET  Recent Labs  06/08/15 0308 06/09/15 0448  NA 143 149*  K 3.1* 3.7  CL 109 114*  CO2 24 20*  GLUCOSE 109* 82  BUN 14 12  CREATININE 0.49 0.47  CALCIUM 8.3* 8.6*   PT/INR  Recent Labs  06/07/15 0955 06/09/15 0448  LABPROT 13.2 13.9  INR 0.98 1.05     Recent Labs Lab 06/06/15 1419 06/06/15 1527 06/07/15 0955 06/08/15 0308  AST 29 26 32 25  ALT 21 20 23 20   ALKPHOS 97 93 90 67  BILITOT 0.4 0.5 0.5 0.7  PROT 7.7 7.4 7.7 6.3*  ALBUMIN 4.0 3.6 4.1 3.1*     Lipase     Component Value Date/Time   LIPASE 17 06/06/2015 1527     Studies/Results: Ct Cta Abd/pel W/cm &/or W/o Cm  06/07/2015  CLINICAL DATA:  Lower abdominal pain since yesterday morning, nausea, vomiting, emesis coffee brown in color, 20 pound weight loss over 2 years, diffuse abdominal tenderness, history peripheral arterial  disease post abdominal aortic endovascular stent graft placement, COPD, former smoker EXAM: CTA ABDOMEN AND PELVIS wITHOUT AND WITH CONTRAST TECHNIQUE: Multidetector CT imaging of the abdomen and pelvis was performed using the standard protocol during bolus administration of intravenous contrast. Multiplanar reconstructed images and MIPs were obtained and reviewed to evaluate the vascular anatomy. CONTRAST:  159mL OMNIPAQUE IOHEXOL 350 MG/ML SOLN COMPARISON:  None. FINDINGS: Emphysematous changes at lung bases with LEFT basilar scarring. Small pericardial effusion. BILATERAL renal cysts. 15 x 10 mm nodule at posterior aspect upper pole LEFT kidney, concerning for renal neoplasm. Multiple BILATERAL nonobstructing renal calculi largest 10 mm diameter RIGHT kidney image 41. Liver, gallbladder, spleen, pancreas, kidneys, and adrenal glands otherwise unremarkable. Post antrectomy and gastrojejunostomy. Dilated small bowel up to 7.6 cm diameter. Mid small bowel obstruction identified secondary to an entero-enteric intussusception in the central pelvis ; wall thickening at the distal intussusceptum suspicious for mass. Distal small bowel loops and colon appear decompressed. Small amount of nonspecific free intraperitoneal fluid. Focal fluid attenuation collection in RIGHT pelvis 2.7 x 2.1 cm image 79 question ovarian cyst. Uterus surgically absent. Bones demineralized with degenerative disc disease changes at L4-L5. Extensive atherosclerotic calcifications. Prior endoluminal stenting of the abdominal aorta extending into common iliac arteries. Thrombus is seen within the stent  at the distal aorta extending into the common iliac arteries bilaterally. Marked narrowing of RIGHT common iliac artery at its origin. Significant thrombus within LEFT common and external iliac arteries with lack of contrast opacification of the LEFT external iliac artery consistent with thrombosis. Patent LEFT axillofemoral bypass graft. LEFT  internal iliac artery appears occluded though with distal filling of branches by collaterals. Significant narrowing of the RIGHT common femoral artery with suspected occlusion of the proximal RIGHT SFA. Occluded LEFT common femoral artery. No evidence of periaortic hemorrhage. Review of the MIP images confirms the above findings. IMPRESSION: Enteroenteric intussusception at the mid small bowel with secondary small bowel obstruction. Suspected lead point/mass within small bowel as eitiology. No evidence of bowel perforation. Extensive atherosclerotic disease post aortoiliac stent graft placement and LEFT axillofemoral bypass grafting. BILATERAL nonobstructing renal calculi and renal cysts. 1.5 x 1.0 cm diameter potential in neoplasm at upper pole LEFT kidney. Small pericardial effusion. Findings called to Dr. Kathrynn Humble on 06/07/2015 at 1119 hours. Electronically Signed   By: Lavonia Dana M.D.   On: 06/07/2015 11:22    Medications: . heparin  5,000 Units Subcutaneous 3 times per day  . pantoprazole (PROTONIX) IV  40 mg Intravenous Q24H   . 0.9 % NaCl with KCl 20 mEq / L 100 mL/hr at 06/09/15 0500   Assessment/Plan Intussuception of small bowel  Small bowel resection with anastomosis, lysis of adhesions, 06/07/15, Dr. 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 Chronic abdominal pain/fibromyalgia PE and significant vascular disease on chronic anticoagulation (currently on Lovenox bridging last dose this AM) COPD/ 40 year hx of tobacco use, quit 10 years ago. 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  Daily labs ordered    Plan:  Change IV fluids, mobilize more, and give her ice chips today.   LOS: 2 days    Kohan Azizi 06/09/2015

## 2015-06-09 NOTE — Progress Notes (Addendum)
PT found with NG tube taken out. Pt states she does not know why she took it out. MD notified. Will continue to monitor.  K. Schorr verbalized to allow general surgery to assess pt during morning rounds prior to reinserting NG tube.

## 2015-06-09 NOTE — Progress Notes (Signed)
[facility] PROGRESS NOTE  [name] [id number] DOB: 12-Jul-1945 DOA: 06/07/2015 PCP: [name] Koyanagi, MD  Summary 06/08/15: I have seen and examined [name] at bedside and reviewed her chart. Appreciate general surgery. Spoke with Dr. Kieth Brightly.Cynthia Bowen is a pleasant 70 year old female with history of PE/anticoagulation on hold perioperatively, who was admitted with intussusception status post bowel resection with reanastomosis on 06/07/15. She complains of pain and wonders if she can have something to drink. Not passing gas yet. Her white count is elevated and this is concerning, hopefully all reactionary. Will have low threshold to add antibiotics if suggestion of infection. Will add Protonix meanwhile, transfer to telemetry when bed becomes available and defer rest of management to general surgery.  06/09/15: Appreciate gen surgery. Patient had NGT removed today. She appears to have acute COPD Exacerbation with cough productive of brown sputum; CXR shows no infiltrate. Will start Ceftriaxone/Zithromax/Albuterol, and defer post op  Surgical management to gen surgery team. Patient will resume anticoagulation when ok from surgical stand point. Plan SBO (small bowel obstruction) (HCC)/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 March 2014/Intussusception intestine (HCC)/Leukocytosis  Has NG tube/nothing by mouth  Defer management to general surgery  Low threshold for antibiotics if evidence of infection History of pulmonary embolism/Long term (current) use of anticoagulants [Z79.01]  Anticoagulation on hold perioperatively  Resume Lovenox bridge when it's okay with general surgery PAD (peripheral artery disease) (HCC)/Abnormal finding on mammography  No acute changes  Monitor Hypokalemia  Replenish potassium as necessary COPD Exacerbation  Ceftriaxone/Zithromax/Albuterol Code Status: Full code Family Communication: None at  bedside Disposition Plan: Eventual discharge plan to be decided depending on clinical progress,   Consultants:  Gen. surgery  Procedures:  Bowel resection with reanastomosis on 06/07/15  Antibiotics:  Ceftriaxone 06/09/15>>  Zithromax 06/09/15>>  HPI/Subjective: Coughing, has abdominal pain.  Objective: Filed Vitals:   06/08/15 2140 06/09/15 1257  BP: 150/60 151/68  Pulse: 85 80  Temp: 98.5 F (36.9 C) 97.9 F (36.6 C)  Resp: 20 18    Intake/Output Summary (Last 24 hours) at 06/09/15 1840 Last data filed at 06/09/15 1100  Gross per 24 hour  Intake   1100 ml  Output   1400 ml  Net   -300 ml   Filed Weights   06/07/15 2039  Weight: 40.1 kg (88 lb 6.5 oz)    Exam:   General:  Comfortable at rest.  Cardiovascular: S1-S2 normal. No murmurs. Pulse regular.  Respiratory: Good air entry bilaterally. No rhonchi or rales.  Abdomen: Soft, tender. Normal bowel sounds. No organomegaly. No bleed from surgical scar.  Musculoskeletal: No pedal edema   Neurological: Intact  Data Reviewed: Basic Metabolic Panel:  Recent Labs Lab 06/06/15 1419 06/06/15 1527 06/07/15 0955 06/08/15 0308 06/09/15 0448  NA 141 141 142 143 149*  K 3.8 3.3* 3.4* 3.1* 3.7  CL 104 107 104 109 114*  CO2 27 26 27 24  20*  GLUCOSE 113* 115* 97 109* 82  BUN 18 19 19 14 12   CREATININE 0.66 0.69 0.76 0.49 0.47  CALCIUM 9.7 8.7* 9.1 8.3* 8.6*  MG  --   --   --  1.5* 2.0  PHOS  --   --   --  3.1 1.9*   Liver Function Tests:  Recent Labs Lab 06/06/15 1419 06/06/15 1527 06/07/15 0955 06/08/15 0308  AST 29 26 32 25  ALT 21 20 23 20   ALKPHOS 97 93 90 67  BILITOT 0.4 0.5 0.5 0.7  PROT 7.7 7.4 7.7 6.3*  ALBUMIN 4.0 3.6 4.1 3.1*    Recent Labs Lab 06/06/15 1527  LIPASE 17   No results for input(s): AMMONIA in the last 168 hours. CBC:  Recent Labs Lab 06/06/15 1423 06/06/15 1527 06/07/15 0955 06/08/15 0308 06/09/15 0448  WBC 15.2* 15.1* 11.7* 20.1* 13.2*  NEUTROABS  --    --  8.9*  --   --   HGB 14.5 13.0 13.4 11.3* 9.7*  HCT 43.3 38.6 40.0 33.7* 30.0*  MCV 83.2 83.4 83.9 83.6 86.2  PLT  --  247 243 158 188   Cardiac Enzymes: No results for input(s): CKTOTAL, CKMB, CKMBINDEX, TROPONINI in the last 168 hours. BNP (last 3 results) No results for input(s): BNP in the last 8760 hours.  ProBNP (last 3 results) No results for input(s): PROBNP in the last 8760 hours.  CBG: No results for input(s): GLUCAP in the last 168 hours.  Recent Results (from the past 240 hour(s))  MRSA PCR Screening     Status: None   Collection Time: 06/07/15  9:00 PM  Result Value Ref Range Status   MRSA by PCR NEGATIVE NEGATIVE Final    Comment:        The GeneXpert MRSA Assay (FDA approved for NASAL specimens only), is one component of a comprehensive MRSA colonization surveillance program. It is not intended to diagnose MRSA infection nor to guide or monitor treatment for MRSA infections.   Culture, Urine     Status: None   Collection Time: 06/08/15  4:55 AM  Result Value Ref Range Status   Specimen Description URINE, CATHETERIZED  Final   Special Requests Normal  Final   Culture   Final    NO GROWTH 1 DAY Performed at [facility]    Report Status 06/09/2015 FINAL  Final     Studies: No results found.  Scheduled Meds: . azithromycin  500 mg Intravenous Q24H  . cefTRIAXone (ROCEPHIN)  IV  1 g Intravenous Q24H  . heparin  5,000 Units Subcutaneous 3 times per day  . pantoprazole (PROTONIX) IV  40 mg Intravenous Q24H   Continuous Infusions: . dexrose 5 % and 0.45 % NaCl with KCl 30 mEq/L 100 mL/hr at 06/09/15 1051     Time spent: 25 minutes    Joahan Swatzell  [facility] Pager [phone]. If 7PM-7AM, please contact night-coverage at [email or web address], password [facility] 06/09/2015, 6:40 PM  LOS: 2 days

## 2015-06-10 ENCOUNTER — Encounter: Payer: Self-pay | Admitting: Vascular Surgery

## 2015-06-10 LAB — CBC WITH DIFFERENTIAL/PLATELET
BASOS PCT: 0 %
Basophils Absolute: 0 10*3/uL (ref 0.0–0.1)
EOS PCT: 1 %
Eosinophils Absolute: 0.1 10*3/uL (ref 0.0–0.7)
HCT: 31 % — ABNORMAL LOW (ref 36.0–46.0)
Hemoglobin: 10.2 g/dL — ABNORMAL LOW (ref 12.0–15.0)
Lymphocytes Relative: 15 %
Lymphs Abs: 1.6 10*3/uL (ref 0.7–4.0)
MCH: 28.5 pg (ref 26.0–34.0)
MCHC: 32.9 g/dL (ref 30.0–36.0)
MCV: 86.6 fL (ref 78.0–100.0)
MONO ABS: 0.7 10*3/uL (ref 0.1–1.0)
Monocytes Relative: 7 %
NEUTROS PCT: 77 %
Neutro Abs: 8.1 10*3/uL — ABNORMAL HIGH (ref 1.7–7.7)
PLATELETS: 249 10*3/uL (ref 150–400)
RBC: 3.58 MIL/uL — ABNORMAL LOW (ref 3.87–5.11)
RDW: 21.5 % — ABNORMAL HIGH (ref 11.5–15.5)
WBC: 10.5 10*3/uL (ref 4.0–10.5)

## 2015-06-10 LAB — COMPREHENSIVE METABOLIC PANEL
ALK PHOS: 58 U/L (ref 38–126)
ALT: 22 U/L (ref 14–54)
AST: 20 U/L (ref 15–41)
Albumin: 3.1 g/dL — ABNORMAL LOW (ref 3.5–5.0)
Anion gap: 9 (ref 5–15)
BUN: 6 mg/dL (ref 6–20)
CALCIUM: 8.7 mg/dL — AB (ref 8.9–10.3)
CHLORIDE: 103 mmol/L (ref 101–111)
CO2: 28 mmol/L (ref 22–32)
CREATININE: 0.43 mg/dL — AB (ref 0.44–1.00)
Glucose, Bld: 113 mg/dL — ABNORMAL HIGH (ref 65–99)
Potassium: 3.5 mmol/L (ref 3.5–5.1)
Sodium: 140 mmol/L (ref 135–145)
TOTAL PROTEIN: 6.4 g/dL — AB (ref 6.5–8.1)
Total Bilirubin: 0.5 mg/dL (ref 0.3–1.2)

## 2015-06-10 MED ORDER — ENOXAPARIN SODIUM 40 MG/0.4ML ~~LOC~~ SOLN
40.0000 mg | Freq: Two times a day (BID) | SUBCUTANEOUS | Status: DC
  Administered 2015-06-11 – 2015-06-14 (×8): 40 mg via SUBCUTANEOUS
  Filled 2015-06-10 (×8): qty 0.4

## 2015-06-10 MED ORDER — ENOXAPARIN SODIUM 40 MG/0.4ML ~~LOC~~ SOLN: 40.0000 mg | Freq: Two times a day (BID) | SUBCUTANEOUS | Status: AC

## 2015-06-10 NOTE — Clinical Documentation Improvement (Signed)
Internal Medicine  Nutritional Consult notes Severe Malnutrition evidenced by severe depletion of muscle mass, severe depletion of body fat. BMI is 14.71.  Please assess Consult dated 06/08/15 and render an opinion in next progress note NOT in BPA drop down box. Thanks.   Other  Clinically Undetermined  Supporting Information:  Please exercise your independent, professional judgment when responding. A specific answer is not anticipated or expected.  Thank You, Zoila Shutter RN, BSN, [place] [phone]; Cell: [phone]

## 2015-06-10 NOTE — Progress Notes (Signed)
[facility] PROGRESS NOTE  Cynthia Bowen [id number] DOB: 1946/03/01 DOA: 06/07/2015 PCP: [name] Koyanagi, MD  Summary 06/08/15: I have seen and examined [name] at bedside and reviewed her chart. Appreciate general surgery. Spoke with Dr. Kieth Brightly.[name] is a pleasant 70 year old female with history of PE/anticoagulation on hold perioperatively, who was admitted with intussusception status post bowel resection with reanastomosis on 06/07/15. She complains of pain and wonders if she can have something to drink. Not passing gas yet. Her white count is elevated and this is concerning, hopefully all reactionary. Will have low threshold to add antibiotics if suggestion of infection. Will add Protonix meanwhile, transfer to telemetry when bed becomes available and defer rest of management to general surgery.  06/09/15: Appreciate gen surgery. Patient had NGT removed today. She appears to have acute COPD Exacerbation with cough productive of brown sputum; CXR shows no infiltrate. Will start Ceftriaxone/Zithromax/Albuterol, and defer post opsurgical management to gen surgery team. Patient will resume anticoagulation when ok from surgical stand point. 06/10/15: Appreciate gen surgery. Patient taking sips. White count has improved 20.100>>13.200>>10.500. Patient will start anticoagulation-Lovenox for now. Appreciate pharmacy. Patient complaints of abdominal pain, less cough. Plan SBO (small bowel obstruction) (HCC)/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 March 2014/Intussusception intestine (HCC)/Leukocytosis  On ice chips/sips  Defer management to general surgery History of pulmonary embolism/Long term (current) use of anticoagulants [Z79.01]  Start Lovenox bridge when it's okay with general surgery PAD (peripheral artery disease) (HCC)/Abnormal finding on mammography  No acute changes  Monitor Hypokalemia/Severe malnutrition  Replenish potassium  as necessary  Nutritional supplements when patient can take oral. COPD Exacerbation  Day 2 Ceftriaxone/Zithromax  Continue Albuterol Code Status: Full code Family Communication: None at bedside Disposition Plan: Eventual discharge plan to be decided depending on clinical progress,   Consultants:  Gen. surgery  Procedures:  Bowel resection with reanastomosis on 06/07/15  Antibiotics:  Ceftriaxone 06/09/15>>  Zithromax 06/09/15>>  HPI/Subjective: Abdominal pain, less cough.  Objective: Filed Vitals:   06/10/15 1445 06/10/15 1952  BP: 141/65 127/63  Pulse: 65 67  Temp: 99.1 F (37.3 C) 98.2 F (36.8 C)  Resp: 20 20    Intake/Output Summary (Last 24 hours) at 06/10/15 2041 Last data filed at 06/10/15 1857  Gross per 24 hour  Intake   1240 ml  Output   3220 ml  Net  -1980 ml   Filed Weights   06/07/15 2039  Weight: 40.1 kg (88 lb 6.5 oz)    Exam:   General:  Comfortable at rest.  Cardiovascular: S1-S2 normal. No murmurs. Pulse regular.  Respiratory: Good air entry bilaterally. No rhonchi or rales.  Abdomen: Soft but tender. No bleed. No organomegaly.  Musculoskeletal: No pedal edema   Neurological: Intact  Data Reviewed: Basic Metabolic Panel:  Recent Labs Lab 06/06/15 1527 06/07/15 0955 06/08/15 0308 06/09/15 0448 06/10/15 0456  NA 141 142 143 149* 140  K 3.3* 3.4* 3.1* 3.7 3.5  CL 107 104 109 114* 103  CO2 26 27 24  20* 28  GLUCOSE 115* 97 109* 82 113*  BUN 19 19 14 12 6   CREATININE 0.69 0.76 0.49 0.47 0.43*  CALCIUM 8.7* 9.1 8.3* 8.6* 8.7*  MG  --   --  1.5* 2.0  --   PHOS  --   --  3.1 1.9*  --    Liver Function Tests:  Recent Labs Lab 06/06/15 1419 06/06/15 1527 06/07/15 0955 06/08/15 0308 06/10/15 0456  AST 29 26 32 25  20  ALT 21 20 23 20 22   ALKPHOS 97 93 90 67 58  BILITOT 0.4 0.5 0.5 0.7 0.5  PROT 7.7 7.4 7.7 6.3* 6.4*  ALBUMIN 4.0 3.6 4.1 3.1* 3.1*    Recent Labs Lab 06/06/15 1527  LIPASE 17   No results  for input(s): AMMONIA in the last 168 hours. CBC:  Recent Labs Lab 06/06/15 1527 06/07/15 0955 06/08/15 0308 06/09/15 0448 06/10/15 0456  WBC 15.1* 11.7* 20.1* 13.2* 10.5  NEUTROABS  --  8.9*  --   --  8.1*  HGB 13.0 13.4 11.3* 9.7* 10.2*  HCT 38.6 40.0 33.7* 30.0* 31.0*  MCV 83.4 83.9 83.6 86.2 86.6  PLT 247 243 158 188 249   Cardiac Enzymes: No results for input(s): CKTOTAL, CKMB, CKMBINDEX, TROPONINI in the last 168 hours. BNP (last 3 results) No results for input(s): BNP in the last 8760 hours.  ProBNP (last 3 results) No results for input(s): PROBNP in the last 8760 hours.  CBG: No results for input(s): GLUCAP in the last 168 hours.  Recent Results (from the past 240 hour(s))  MRSA PCR Screening     Status: None   Collection Time: 06/07/15  9:00 PM  Result Value Ref Range Status   MRSA by PCR NEGATIVE NEGATIVE Final    Comment:        The GeneXpert MRSA Assay (FDA approved for NASAL specimens only), is one component of a comprehensive MRSA colonization surveillance program. It is not intended to diagnose MRSA infection nor to guide or monitor treatment for MRSA infections.   Culture, Urine     Status: None   Collection Time: 06/08/15  4:55 AM  Result Value Ref Range Status   Specimen Description URINE, CATHETERIZED  Final   Special Requests Normal  Final   Culture   Final    NO GROWTH 1 DAY Performed at [facility]    Report Status 06/09/2015 FINAL  Final     Studies: Dg Chest Port 1 View  06/09/2015  CLINICAL DATA:  70 year old female with shortness of breath EXAM: PORTABLE CHEST 1 VIEW COMPARISON:  Radiograph dated 08/26/2014 FINDINGS: For single-view of the chest demonstrates emphysematous changes of the lungs. There is no focal consolidation, pleural effusion, or pneumothorax. There is persistent left costophrenic opacity, likely chronic and related to scarring. There is osteopenia. Cervical fixation plate and screws is partially visualized.  The cardiac silhouette is within normal limits. No acute osseous pathology. IMPRESSION: No active disease. Electronically Signed   By: Anner Crete M.D.   On: 06/09/2015 19:26    Scheduled Meds: . azithromycin  500 mg Intravenous Q24H  . cefTRIAXone (ROCEPHIN)  IV  1 g Intravenous QPC supper  . enoxaparin (LOVENOX) injection  40 mg Subcutaneous Q12H  . [START ON 06/11/2015] enoxaparin (LOVENOX) injection  40 mg Subcutaneous Q12H  . pantoprazole (PROTONIX) IV  40 mg Intravenous Q24H   Continuous Infusions: . dexrose 5 % and 0.45 % NaCl with KCl 30 mEq/L 100 mL/hr at 06/10/15 1941     Time spent: 25 minutes    Juanmanuel Marohl  [facility] Pager [phone]. If 7PM-7AM, please contact night-coverage at [email or web address], password [facility] 06/10/2015, 8:41 PM  LOS: 3 days

## 2015-06-10 NOTE — Progress Notes (Signed)
Patient ID: Cynthia Bowen, female   DOB: 06/23/1945, 70 y.o.   MRN: [id number]     [address] [place], [place] [address]    Phone: [phone] FAX: [phone]     Subjective: Pt tearful, c/o pain.  No dilaudid since last night ~2200. Nausea, no vomiting.  No flatus. Mobilizing. No IS Afebrile.   Objective:  Vital signs:  Filed Vitals:   06/08/15 2140 06/09/15 1257 06/09/15 2113 06/10/15 0553  BP: 150/60 151/68 151/58 146/61  Pulse: 85 80 63 73  Temp: 98.5 F (36.9 C) 97.9 F (36.6 C) 98.9 F (37.2 C) 98.5 F (36.9 C)  TempSrc: Oral Oral Oral Oral  Resp: '20 18 18 18  ' Height:      Weight:      SpO2: 98% 97% 98% 100%       Intake/Output   Yesterday:  02/16 0701 - 02/17 0700 In: 1055 [P.O.:340; I.V.:715] Out: 4000 [Urine:4000] This shift:    I/O last 3 completed shifts: In: 2155 [P.O.:340; I.V.:1815] Out: 5320 [Urine:4550]    Physical Exam: General: Pt awake/alert/oriented x4 in no acute distress Abdomen: Soft.  Nondistended.  *Mildly tender at incisions only.  Dressing removed, midline incision with approximated wound edges no erythema or drainage. Staples in place. No evidence of peritonitis.  No incarcerated hernias.    Problem List:   Principal Problem:   SBO (small bowel obstruction) (HCC) Active Problems:   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 [id number]   History of pulmonary embolism   PAD (peripheral artery disease) (HCC)   Abnormal finding on mammography   Long term (current) use of anticoagulants [Z79.01]   Intussusception intestine (HCC)   Leukocytosis   COPD exacerbation (HCC)    Results:   Labs: Results for orders placed or performed during the hospital encounter of 06/07/15 (from the past 48 hour(s))  CBC     Status: Abnormal   Collection Time: 06/09/15  4:48 AM  Result Value Ref Range   WBC 13.2 (H) 4.0 - 10.5 K/uL   RBC  3.48 (L) 3.87 - 5.11 MIL/uL   Hemoglobin 9.7 (L) 12.0 - 15.0 g/dL   HCT 30.0 (L) 36.0 - 46.0 %   MCV 86.2 78.0 - 100.0 fL   MCH 27.9 26.0 - 34.0 pg   MCHC 32.3 30.0 - 36.0 g/dL   RDW 22.1 (H) 11.5 - 15.5 %   Platelets 188 150 - 400 K/uL  Basic metabolic panel     Status: Abnormal   Collection Time: 06/09/15  4:48 AM  Result Value Ref Range   Sodium 149 (H) 135 - 145 mmol/L   Potassium 3.7 3.5 - 5.1 mmol/L   Chloride 114 (H) 101 - 111 mmol/L   CO2 20 (L) 22 - 32 mmol/L   Glucose, Bld 82 65 - 99 mg/dL   BUN 12 6 - 20 mg/dL   Creatinine, Ser 0.47 0.44 - 1.00 mg/dL   Calcium 8.6 (L) 8.9 - 10.3 mg/dL   GFR calc non Af Amer >60 >60 mL/min   GFR calc Af Amer >60 >60 mL/min    Comment: (NOTE) The eGFR has been calculated using the CKD EPI equation. This calculation has not been validated in all clinical situations. eGFR's persistently <60 mL/min signify possible Chronic Kidney Disease.    Anion gap 15 5 - 15  Protime-INR     Status: None   Collection Time:  06/09/15  4:48 AM  Result Value Ref Range   Prothrombin Time 13.9 11.6 - 15.2 seconds   INR 1.05 0.00 - 1.49  Magnesium     Status: None   Collection Time: 06/09/15  4:48 AM  Result Value Ref Range   Magnesium 2.0 1.7 - 2.4 mg/dL  Phosphorus     Status: Abnormal   Collection Time: 06/09/15  4:48 AM  Result Value Ref Range   Phosphorus 1.9 (L) 2.5 - 4.6 mg/dL  CBC with Differential/Platelet     Status: Abnormal   Collection Time: 06/10/15  4:56 AM  Result Value Ref Range   WBC 10.5 4.0 - 10.5 K/uL   RBC 3.58 (L) 3.87 - 5.11 MIL/uL   Hemoglobin 10.2 (L) 12.0 - 15.0 g/dL   HCT 31.0 (L) 36.0 - 46.0 %   MCV 86.6 78.0 - 100.0 fL   MCH 28.5 26.0 - 34.0 pg   MCHC 32.9 30.0 - 36.0 g/dL   RDW 21.5 (H) 11.5 - 15.5 %   Platelets 249 150 - 400 K/uL   Neutrophils Relative % 77 %   Lymphocytes Relative 15 %   Monocytes Relative 7 %   Eosinophils Relative 1 %   Basophils Relative 0 %   Neutro Abs 8.1 (H) 1.7 - 7.7 K/uL   Lymphs  Abs 1.6 0.7 - 4.0 K/uL   Monocytes Absolute 0.7 0.1 - 1.0 K/uL   Eosinophils Absolute 0.1 0.0 - 0.7 K/uL   Basophils Absolute 0.0 0.0 - 0.1 K/uL   RBC Morphology TARGET CELLS   Comprehensive metabolic panel     Status: Abnormal   Collection Time: 06/10/15  4:56 AM  Result Value Ref Range   Sodium 140 135 - 145 mmol/L    Comment: DELTA CHECK NOTED REPEATED TO VERIFY    Potassium 3.5 3.5 - 5.1 mmol/L   Chloride 103 101 - 111 mmol/L   CO2 28 22 - 32 mmol/L   Glucose, Bld 113 (H) 65 - 99 mg/dL   BUN 6 6 - 20 mg/dL   Creatinine, Ser 0.43 (L) 0.44 - 1.00 mg/dL   Calcium 8.7 (L) 8.9 - 10.3 mg/dL   Total Protein 6.4 (L) 6.5 - 8.1 g/dL   Albumin 3.1 (L) 3.5 - 5.0 g/dL   AST 20 15 - 41 U/L   ALT 22 14 - 54 U/L   Alkaline Phosphatase 58 38 - 126 U/L   Total Bilirubin 0.5 0.3 - 1.2 mg/dL   GFR calc non Af Amer >60 >60 mL/min   GFR calc Af Amer >60 >60 mL/min    Comment: (NOTE) The eGFR has been calculated using the CKD EPI equation. This calculation has not been validated in all clinical situations. eGFR's persistently <60 mL/min signify possible Chronic Kidney Disease.    Anion gap 9 5 - 15    Imaging / Studies: Dg Chest Port 1 View  06/09/2015  CLINICAL DATA:  70 year old female with shortness of breath EXAM: PORTABLE CHEST 1 VIEW COMPARISON:  Radiograph dated 08/26/2014 FINDINGS: For single-view of the chest demonstrates emphysematous changes of the lungs. There is no focal consolidation, pleural effusion, or pneumothorax. There is persistent left costophrenic opacity, likely chronic and related to scarring. There is osteopenia. Cervical fixation plate and screws is partially visualized. The cardiac silhouette is within normal limits. No acute osseous pathology. IMPRESSION: No active disease. Electronically Signed   By: Anner Crete M.D.   On: 06/09/2015 19:26    Medications / Allergies:  Scheduled  Meds: . azithromycin  500 mg Intravenous Q24H  . cefTRIAXone (ROCEPHIN)  IV  1 g  Intravenous QPC supper  . heparin  5,000 Units Subcutaneous 3 times per day  . pantoprazole (PROTONIX) IV  40 mg Intravenous Q24H   Continuous Infusions: . dexrose 5 % and 0.45 % NaCl with KCl 30 mEq/L 100 mL/hr at 06/09/15 1051   PRN Meds:.acetaminophen **OR** acetaminophen, albuterol, HYDROmorphone (DILAUDID) injection, ondansetron **OR** ondansetron (ZOFRAN) IV  Antibiotics: Anti-infectives    Start     Dose/Rate Route Frequency Ordered Stop   06/09/15 2000  azithromycin (ZITHROMAX) 500 mg in dextrose 5 % 250 mL IVPB     500 mg 250 mL/hr over 60 Minutes Intravenous Every 24 hours 06/09/15 1839 06/12/15 1959   06/09/15 1900  cefTRIAXone (ROCEPHIN) 1 g in dextrose 5 % 50 mL IVPB     1 g 100 mL/hr over 30 Minutes Intravenous Daily after supper 06/09/15 1839 06/16/15 1759   06/08/15 0600  cefoTEtan in Dextrose 5% (CEFOTAN) IVPB 2 g  Status:  Discontinued     2 g Intravenous On call to O.R. 06/07/15 1414 06/07/15 2019        Assessment/Plan intussusception of small bowel  POD#3 exploratory laparotomy, LOA, SBR---Dr. Kieth Brightly 06/07/15 -continue with sips and await bowel function -mobilize, needs IS at bedside and teaching, d/w nursing -needs better pain control, will give dilaudid or ordered ABLA-mild, stable VTE prophylaxis-SCD/heparin FEN-sips, IVF Dispo-ileus  Erby Pian, ANP-BC [facility] Pager [phone]([id number])   06/10/2015 9:19 AM

## 2015-06-11 LAB — COMPREHENSIVE METABOLIC PANEL
ALT: 20 U/L (ref 14–54)
ANION GAP: 9 (ref 5–15)
AST: 28 U/L (ref 15–41)
Albumin: 3 g/dL — ABNORMAL LOW (ref 3.5–5.0)
Alkaline Phosphatase: 61 U/L (ref 38–126)
BUN: 6 mg/dL (ref 6–20)
CHLORIDE: 105 mmol/L (ref 101–111)
CO2: 24 mmol/L (ref 22–32)
CREATININE: 0.58 mg/dL (ref 0.44–1.00)
Calcium: 9 mg/dL (ref 8.9–10.3)
Glucose, Bld: 115 mg/dL — ABNORMAL HIGH (ref 65–99)
POTASSIUM: 5 mmol/L (ref 3.5–5.1)
SODIUM: 138 mmol/L (ref 135–145)
Total Bilirubin: 0.8 mg/dL (ref 0.3–1.2)
Total Protein: 6.5 g/dL (ref 6.5–8.1)

## 2015-06-11 LAB — CBC
HCT: 32.4 % — ABNORMAL LOW (ref 36.0–46.0)
Hemoglobin: 10.8 g/dL — ABNORMAL LOW (ref 12.0–15.0)
MCH: 28.2 pg (ref 26.0–34.0)
MCHC: 33.3 g/dL (ref 30.0–36.0)
MCV: 84.6 fL (ref 78.0–100.0)
PLATELETS: 241 10*3/uL (ref 150–400)
RBC: 3.83 MIL/uL — AB (ref 3.87–5.11)
RDW: 20.8 % — AB (ref 11.5–15.5)
WBC: 8.1 10*3/uL (ref 4.0–10.5)

## 2015-06-11 NOTE — Progress Notes (Signed)
Utilization review completed.  

## 2015-06-11 NOTE — Progress Notes (Signed)
[facility] PROGRESS NOTE  DANIYA HERMANCE [id number] DOB: 05-27-1945 DOA: 06/07/2015 PCP: [name] Koyanagi, MD  Summary 06/08/15: I have seen and examined Ms. Ivins at bedside and reviewed her chart. Appreciate general surgery. Spoke with Dr. Kieth Brightly.Shayley Kilber is a pleasant 71 year old female with history of PE/anticoagulation on hold perioperatively, who was admitted with intussusception status post bowel resection with reanastomosis on 06/07/15. She complains of pain and wonders if she can have something to drink. Not passing gas yet. Her white count is elevated and this is concerning, hopefully all reactionary. Will have low threshold to add antibiotics if suggestion of infection. Will add Protonix meanwhile, transfer to telemetry when bed becomes available and defer rest of management to general surgery.  06/09/15: Appreciate gen surgery. Patient had NGT removed today. She appears to have acute COPD Exacerbation with cough productive of brown sputum; CXR shows no infiltrate. Will start Ceftriaxone/Zithromax/Albuterol, and defer post opsurgical management to gen surgery team. Patient will resume anticoagulation when ok from surgical stand point. 06/10/15: Appreciate gen surgery. Patient taking sips. White count has improved 20.100>>13.200>>10.500. Patient will start anticoagulation-Lovenox for now. Appreciate pharmacy. Patient complaints of abdominal pain, less cough. 06/11/15: Appreciate general surgery. Diet advance to clear liquids. White count continues to improve 20.100>>13.200>>10.500>>8.100. Hemoglobin stable. No evidence of bleeding. Continue antibiotics for COPD exacerbation, therapeutic doses of Lovenox with plans to resume Coumadin when patient can take oral more consistently, and adjust diet per general surgery. Patient has no complaints. Plan SBO (small bowel obstruction) (HCC)/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 March  2014/Intussusception intestine (HCC)/Leukocytosis  Diet advanced to clear liquid  Defer management to general surgery History of pulmonary embolism/Long term (current) use of anticoagulants [Z79.01]  On Lovenox bridge to start Coumadin when patient can take oral consistently. Appreciate pharmacy. PAD (peripheral artery disease) (HCC)/Abnormal finding on mammography  No acute changes  Monitor Hypokalemia/Severe malnutrition  Replenish potassium as necessary  Nutritional supplements when patient can take oral. COPD Exacerbation  Day 3 Ceftriaxone/Zithromax  Continue Albuterol Code Status: Full code Family Communication: None at bedside Disposition Plan: Prefers to go home, probably mid next week   Consultants:  Gen. surgery  Procedures:  Bowel resection with reanastomosis on 06/07/15  Antibiotics:  Ceftriaxone 06/09/15>>  Zithromax 06/09/15>>  HPI/Subjective: No complaints  Objective: Filed Vitals:   06/11/15 0555 06/11/15 1400  BP: 127/78 144/64  Pulse: 69 67  Temp: 98.8 F (37.1 C) 97.6 F (36.4 C)  Resp: 20 20    Intake/Output Summary (Last 24 hours) at 06/11/15 1654 Last data filed at 06/11/15 1455  Gross per 24 hour  Intake   2160 ml  Output   2525 ml  Net   -365 ml   Filed Weights   06/07/15 2039  Weight: 40.1 kg (88 lb 6.5 oz)    Exam:   General:  Comfortable at rest.  Cardiovascular: S1-S2 normal. No murmurs. Pulse regular.  Respiratory: Good air entry bilaterally. No rhonchi or rales.  Abdomen: Soft and nontender. Normal bowel sounds. No organomegaly.  Musculoskeletal: No pedal edema   Neurological: Intact  Data Reviewed: Basic Metabolic Panel:  Recent Labs Lab 06/07/15 0955 06/08/15 0308 06/09/15 0448 06/10/15 0456 06/11/15 0541  NA 142 143 149* 140 138  K 3.4* 3.1* 3.7 3.5 5.0  CL 104 109 114* 103 105  CO2 27 24 20* 28 24  GLUCOSE 97 109* 82 113* 115*  BUN 19 14 12 6 6   CREATININE 0.76 0.49 0.47 0.43* 0.58  CALCIUM 9.1 8.3* 8.6* 8.7* 9.0  MG  --  1.5* 2.0  --   --   PHOS  --  3.1 1.9*  --   --    Liver Function Tests:  Recent Labs Lab 06/06/15 1527 06/07/15 0955 06/08/15 0308 06/10/15 0456 06/11/15 0541  AST 26 32 25 20 28   ALT 20 23 20 22 20   ALKPHOS 93 90 67 58 61  BILITOT 0.5 0.5 0.7 0.5 0.8  PROT 7.4 7.7 6.3* 6.4* 6.5  ALBUMIN 3.6 4.1 3.1* 3.1* 3.0*    Recent Labs Lab 06/06/15 1527  LIPASE 17   No results for input(s): AMMONIA in the last 168 hours. CBC:  Recent Labs Lab 06/07/15 0955 06/08/15 0308 06/09/15 0448 06/10/15 0456 06/11/15 0541  WBC 11.7* 20.1* 13.2* 10.5 8.1  NEUTROABS 8.9*  --   --  8.1*  --   HGB 13.4 11.3* 9.7* 10.2* 10.8*  HCT 40.0 33.7* 30.0* 31.0* 32.4*  MCV 83.9 83.6 86.2 86.6 84.6  PLT 243 158 188 249 241   Cardiac Enzymes: No results for input(s): CKTOTAL, CKMB, CKMBINDEX, TROPONINI in the last 168 hours. BNP (last 3 results) No results for input(s): BNP in the last 8760 hours.  ProBNP (last 3 results) No results for input(s): PROBNP in the last 8760 hours.  CBG: No results for input(s): GLUCAP in the last 168 hours.  Recent Results (from the past 240 hour(s))  MRSA PCR Screening     Status: None   Collection Time: 06/07/15  9:00 PM  Result Value Ref Range Status   MRSA by PCR NEGATIVE NEGATIVE Final    Comment:        The GeneXpert MRSA Assay (FDA approved for NASAL specimens only), is one component of a comprehensive MRSA colonization surveillance program. It is not intended to diagnose MRSA infection nor to guide or monitor treatment for MRSA infections.   Culture, Urine     Status: None   Collection Time: 06/08/15  4:55 AM  Result Value Ref Range Status   Specimen Description URINE, CATHETERIZED  Final   Special Requests Normal  Final   Culture   Final    NO GROWTH 1 DAY Performed at [facility]    Report Status 06/09/2015 FINAL  Final     Studies: Dg Chest Port 1 View  06/09/2015  CLINICAL DATA:   70 year old female with shortness of breath EXAM: PORTABLE CHEST 1 VIEW COMPARISON:  Radiograph dated 08/26/2014 FINDINGS: For single-view of the chest demonstrates emphysematous changes of the lungs. There is no focal consolidation, pleural effusion, or pneumothorax. There is persistent left costophrenic opacity, likely chronic and related to scarring. There is osteopenia. Cervical fixation plate and screws is partially visualized. The cardiac silhouette is within normal limits. No acute osseous pathology. IMPRESSION: No active disease. Electronically Signed   By: Anner Crete M.D.   On: 06/09/2015 19:26    Scheduled Meds: . azithromycin  500 mg Intravenous Q24H  . cefTRIAXone (ROCEPHIN)  IV  1 g Intravenous QPC supper  . enoxaparin (LOVENOX) injection  40 mg Subcutaneous Q12H  . pantoprazole (PROTONIX) IV  40 mg Intravenous Q24H   Continuous Infusions: . dexrose 5 % and 0.45 % NaCl with KCl 30 mEq/L 100 mL/hr at 06/11/15 1533     Time spent: 25 minutes    Alyha Marines  [facility] Pager [phone]. If 7PM-7AM, please contact night-coverage at [email or web address], password [facility] 06/11/2015, 4:54 PM  LOS: 4 days

## 2015-06-11 NOTE — Progress Notes (Signed)
4 Days Post-Op  Subjective: Reports she tolerated sips well. +flatus. Some burping but reports more flatus than burping. Denies n.   Objective: Vital signs in last 24 hours: Temp:  [98.2 F (36.8 C)-99.1 F (37.3 C)] 98.8 F (37.1 C) (02/18 0555) Pulse Rate:  [65-69] 69 (02/18 0555) Resp:  [20] 20 (02/18 0555) BP: (127-141)/(63-78) 127/78 mmHg (02/18 0555) SpO2:  [99 %-100 %] 99 % (02/18 0555) Last BM Date: 06/10/15  Intake/Output from previous day: 02/17 0701 - 02/18 0700 In: 2600 [I.V.:2300; IV Piggyback:300] Out: 2120 [Urine:2120] Intake/Output this shift:    Nad, nontoxic cta Soft, nd, incision c/d/i; approp TTP. +BS  Lab Results:   Recent Labs  06/10/15 0456 06/11/15 0541  WBC 10.5 8.1  HGB 10.2* 10.8*  HCT 31.0* 32.4*  PLT 249 241   BMET  Recent Labs  06/10/15 0456 06/11/15 0541  NA 140 138  K 3.5 5.0  CL 103 105  CO2 28 24  GLUCOSE 113* 115*  BUN 6 6  CREATININE 0.43* 0.58  CALCIUM 8.7* 9.0   PT/INR  Recent Labs  06/09/15 0448  LABPROT 13.9  INR 1.05   ABG No results for input(s): PHART, HCO3 in the last 72 hours.  Invalid input(s): PCO2, PO2  Studies/Results: Dg Chest Port 1 View  06/09/2015  CLINICAL DATA:  70 year old female with shortness of breath EXAM: PORTABLE CHEST 1 VIEW COMPARISON:  Radiograph dated 08/26/2014 FINDINGS: For single-view of the chest demonstrates emphysematous changes of the lungs. There is no focal consolidation, pleural effusion, or pneumothorax. There is persistent left costophrenic opacity, likely chronic and related to scarring. There is osteopenia. Cervical fixation plate and screws is partially visualized. The cardiac silhouette is within normal limits. No acute osseous pathology. IMPRESSION: No active disease. Electronically Signed   By: Anner Crete M.D.   On: 06/09/2015 19:26    Anti-infectives: Anti-infectives    Start     Dose/Rate Route Frequency Ordered Stop   06/09/15 2000  azithromycin  (ZITHROMAX) 500 mg in dextrose 5 % 250 mL IVPB     500 mg 250 mL/hr over 60 Minutes Intravenous Every 24 hours 06/09/15 1839 06/12/15 1959   06/09/15 1900  cefTRIAXone (ROCEPHIN) 1 g in dextrose 5 % 50 mL IVPB     1 g 100 mL/hr over 30 Minutes Intravenous Daily after supper 06/09/15 1839 06/16/15 1759   06/08/15 0600  cefoTEtan in Dextrose 5% (CEFOTAN) IVPB 2 g  Status:  Discontinued     2 g Intravenous On call to O.R. 06/07/15 1414 06/07/15 2019      Assessment/Plan: s/p Procedure(s): EXPLORATORY LAPAROTOMY, LYSIS OF ADHESIONS,  WITH BOWEL RESECTION OFR SMALL BOWEL INTUSSUSCEPTION (N/A) intussusception of small bowel  POD#4 exploratory laparotomy, LOA, SBR---Dr. Kieth Brightly 06/07/15  Adv to clear liquid diet - reminded pt to take slow pulm toilet, is, ambulate vte prophylaxis  Leighton Ruff. Redmond Pulling, MD, FACS General, Bariatric, & Minimally Invasive Surgery [facility], [place]   LOS: 4 days    Gayland Curry 06/11/2015

## 2015-06-12 DIAGNOSIS — I10 Essential (primary) hypertension: Secondary | ICD-10-CM | POA: Diagnosis present

## 2015-06-12 LAB — CBC
HEMATOCRIT: 33.8 % — AB (ref 36.0–46.0)
Hemoglobin: 11.6 g/dL — ABNORMAL LOW (ref 12.0–15.0)
MCH: 28.5 pg (ref 26.0–34.0)
MCHC: 34.3 g/dL (ref 30.0–36.0)
MCV: 83 fL (ref 78.0–100.0)
Platelets: 335 10*3/uL (ref 150–400)
RBC: 4.07 MIL/uL (ref 3.87–5.11)
RDW: 20.3 % — AB (ref 11.5–15.5)
WBC: 6.3 10*3/uL (ref 4.0–10.5)

## 2015-06-12 LAB — BASIC METABOLIC PANEL
Anion gap: 10 (ref 5–15)
BUN: 5 mg/dL — AB (ref 6–20)
CALCIUM: 9.1 mg/dL (ref 8.9–10.3)
CO2: 23 mmol/L (ref 22–32)
CREATININE: 0.5 mg/dL (ref 0.44–1.00)
Chloride: 102 mmol/L (ref 101–111)
GFR calc non Af Amer: >60 mL/min (ref >60)
GLUCOSE: 111 mg/dL — AB (ref 65–99)
Potassium: 4.5 mmol/L (ref 3.5–5.1)
Sodium: 135 mmol/L (ref 135–145)

## 2015-06-12 MED ORDER — HYDROMORPHONE HCL 1 MG/ML IJ SOLN
1.0000 mg | INTRAMUSCULAR | Status: DC | PRN
Start: 2015-06-12 — End: 2015-06-13
  Administered 2015-06-12: 1 mg via INTRAMUSCULAR
  Filled 2015-06-12: qty 1

## 2015-06-12 MED ORDER — AMLODIPINE BESYLATE 5 MG PO TABS
5.0000 mg | ORAL_TABLET | Freq: Every day | ORAL | Status: DC
Start: 2015-06-12 — End: 2015-06-14
  Administered 2015-06-12 – 2015-06-14 (×3): 5 mg via ORAL
  Filled 2015-06-12 (×3): qty 1

## 2015-06-12 MED ORDER — AMOXICILLIN-POT CLAVULANATE 875-125 MG PO TABS
1.0000 | ORAL_TABLET | Freq: Two times a day (BID) | ORAL | Status: DC
  Administered 2015-06-12 – 2015-06-14 (×5): 1 via ORAL
  Filled 2015-06-12 (×5): qty 1

## 2015-06-12 MED ORDER — OXYCODONE-ACETAMINOPHEN 5-325 MG PO TABS
1.0000 | ORAL_TABLET | ORAL | Status: DC | PRN
  Administered 2015-06-12 – 2015-06-14 (×9): 2 via ORAL
  Filled 2015-06-12 (×9): qty 2

## 2015-06-12 NOTE — Progress Notes (Addendum)
[facility] PROGRESS NOTE  KERYN TEUTSCH [id number] DOB: 17-Oct-1945 DOA: 06/07/2015 PCP: [name] Koyanagi, MD  Summary 06/08/15: I have seen and examined Ms. Valliant at bedside and reviewed her chart. Appreciate general surgery. Spoke with Dr. Kieth Brightly.Chasie Finefrock is a pleasant 70 year old female with history of PE/anticoagulation on hold perioperatively, who was admitted with intussusception status post bowel resection with reanastomosis on 06/07/15. She complains of pain and wonders if she can have something to drink. Not passing gas yet. Her white count is elevated and this is concerning, hopefully all reactionary. Will have low threshold to add antibiotics if suggestion of infection. Will add Protonix meanwhile, transfer to telemetry when bed becomes available and defer rest of management to general surgery.  06/09/15: Appreciate gen surgery. Patient had NGT removed today. She appears to have acute COPD Exacerbation with cough productive of brown sputum; CXR shows no infiltrate. Will start Ceftriaxone/Zithromax/Albuterol, and defer post opsurgical management to gen surgery team. Patient will resume anticoagulation when ok from surgical stand point. 06/10/15: Appreciate gen surgery. Patient taking sips. White count has improved 20.100>>13.200>>10.500. Patient will start anticoagulation-Lovenox for now. Appreciate pharmacy. Patient complaints of abdominal pain, less cough. 06/11/15: Appreciate general surgery. Diet advanced to clear liquids. White count continues to improve 20.100>>13.200>>10.500>>8.100. Hemoglobin stable. No evidence of bleeding. Continue antibiotics for COPD exacerbation, therapeutic doses of Lovenox with plans to resume Coumadin when patient can take oral more consistently, and adjust diet per general surgery. Patient has no complaints. 06/12/15: Appreciate general surgery. Patient lost IV access(IV team called to replace access). White count continues to improve. BP  uncontrolled. Will change antibiotics to Augmentin, start Norvasc and continue rest of management per general surgery. Patient complains of abdominal pain. Plan SBO (small bowel obstruction) (HCC)/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 March 2014/Intussusception intestine (HCC)/Leukocytosis  On clear liquid diet  Defer management to general surgery History of pulmonary embolism/Long term (current) use of anticoagulants [Z79.01]  On Lovenox bridge to start Coumadin when patient can take oral consistently. Appreciate pharmacy. PAD (peripheral artery disease) (HCC)/Abnormal finding on mammography/essential hypertension  Norvasc Hypokalemia/Severe malnutrition  Replenish potassium as necessary  Nutritional supplements when patient can take oral. COPD Exacerbation  D/c Ceftriaxone  Augmentin  Continue Albuterol Code Status: Full code Family Communication: None at bedside Disposition Plan: Prefers to go home, probably mid this week   Consultants:  Gen. surgery  Procedures:  Bowel resection with reanastomosis on 06/07/15  Antibiotics:  Ceftriaxone 06/09/15>>06/12/15  Zithromax 06/09/15>>06/11/15  Augmentin 06/12/15  HPI/Subjective: Complains of abdominal pain.  Objective: Filed Vitals:   06/11/15 2036 06/12/15 0419  BP: 149/78 162/64  Pulse: 97 77  Temp: 98.2 F (36.8 C) 98.3 F (36.8 C)  Resp: 20 20    Intake/Output Summary (Last 24 hours) at 06/12/15 1028 Last data filed at 06/12/15 0933  Gross per 24 hour  Intake    360 ml  Output   3175 ml  Net  -2815 ml   Filed Weights   06/07/15 2039  Weight: 40.1 kg (88 lb 6.5 oz)    Exam:   General:  Comfortable at rest.  Cardiovascular: S1-S2 normal. No murmurs. Pulse regular.  Respiratory: Good air entry bilaterally. No rhonchi or rales.  Abdomen: Soft and nontender. Normal bowel sounds. No organomegaly.  Musculoskeletal: No pedal edema   Neurological: Intact  Data  Reviewed: Basic Metabolic Panel:  Recent Labs Lab 06/08/15 0308 06/09/15 0448 06/10/15 0456 06/11/15 0541 06/12/15 0551  NA 143 149* 140 138 135  K 3.1* 3.7 3.5 5.0 4.5  CL 109 114* 103 105 102  CO2 24 20* 28 24 23   GLUCOSE 109* 82 113* 115* 111*  BUN 14 12 6 6  5*  CREATININE 0.49 0.47 0.43* 0.58 0.50  CALCIUM 8.3* 8.6* 8.7* 9.0 9.1  MG 1.5* 2.0  --   --   --   PHOS 3.1 1.9*  --   --   --    Liver Function Tests:  Recent Labs Lab 06/06/15 1527 06/07/15 0955 06/08/15 0308 06/10/15 0456 06/11/15 0541  AST 26 32 25 20 28   ALT 20 23 20 22 20   ALKPHOS 93 90 67 58 61  BILITOT 0.5 0.5 0.7 0.5 0.8  PROT 7.4 7.7 6.3* 6.4* 6.5  ALBUMIN 3.6 4.1 3.1* 3.1* 3.0*    Recent Labs Lab 06/06/15 1527  LIPASE 17   No results for input(s): AMMONIA in the last 168 hours. CBC:  Recent Labs Lab 06/07/15 0955 06/08/15 0308 06/09/15 0448 06/10/15 0456 06/11/15 0541 06/12/15 0551  WBC 11.7* 20.1* 13.2* 10.5 8.1 6.3  NEUTROABS 8.9*  --   --  8.1*  --   --   HGB 13.4 11.3* 9.7* 10.2* 10.8* 11.6*  HCT 40.0 33.7* 30.0* 31.0* 32.4* 33.8*  MCV 83.9 83.6 86.2 86.6 84.6 83.0  PLT 243 158 188 249 241 335   Cardiac Enzymes: No results for input(s): CKTOTAL, CKMB, CKMBINDEX, TROPONINI in the last 168 hours. BNP (last 3 results) No results for input(s): BNP in the last 8760 hours.  ProBNP (last 3 results) No results for input(s): PROBNP in the last 8760 hours.  CBG: No results for input(s): GLUCAP in the last 168 hours.  Recent Results (from the past 240 hour(s))  MRSA PCR Screening     Status: None   Collection Time: 06/07/15  9:00 PM  Result Value Ref Range Status   MRSA by PCR NEGATIVE NEGATIVE Final    Comment:        The GeneXpert MRSA Assay (FDA approved for NASAL specimens only), is one component of a comprehensive MRSA colonization surveillance program. It is not intended to diagnose MRSA infection nor to guide or monitor treatment for MRSA infections.    Culture, Urine     Status: None   Collection Time: 06/08/15  4:55 AM  Result Value Ref Range Status   Specimen Description URINE, CATHETERIZED  Final   Special Requests Normal  Final   Culture   Final    NO GROWTH 1 DAY Performed at [facility]    Report Status 06/09/2015 FINAL  Final     Studies: No results found.  Scheduled Meds: . amoxicillin-clavulanate  1 tablet Oral Q12H  . enoxaparin (LOVENOX) injection  40 mg Subcutaneous Q12H  . pantoprazole (PROTONIX) IV  40 mg Intravenous Q24H   Continuous Infusions: . dexrose 5 % and 0.45 % NaCl with KCl 30 mEq/L Stopped (06/12/15 0800)     Time spent: 15 minutes    Vicktoria Muckey  [facility] Pager [phone]. If 7PM-7AM, please contact night-coverage at [email or web address], password [facility] 06/12/2015, 10:28 AM  LOS: 5 days

## 2015-06-12 NOTE — Progress Notes (Signed)
5 Days Post-Op  Subjective: Pt in tears. IV came out. Hasn't had any pain meds. Having gas pain. States she had lots of flatus yesterday and tolerated diet. No n/v. Min burping.   Objective: Vital signs in last 24 hours: Temp:  [97.6 F (36.4 C)-98.3 F (36.8 C)] 98.3 F (36.8 C) (02/19 0419) Pulse Rate:  [67-97] 77 (02/19 0419) Resp:  [20] 20 (02/19 0419) BP: (144-162)/(64-78) 162/64 mmHg (02/19 0419) SpO2:  [97 %-100 %] 98 % (02/19 0419) Last BM Date: 06/10/15  Intake/Output from previous day: 02/18 0701 - 02/19 0700 In: 360 [P.O.:360] Out: 2475 [Urine:2475] Intake/Output this shift: Total I/O In: -  Out: 450 [Urine:450]  Tearful, non toxic Soft, nd, mild ttp throughout, incision c/d/i; +BS  Lab Results:   Recent Labs  06/11/15 0541 06/12/15 0551  WBC 8.1 6.3  HGB 10.8* 11.6*  HCT 32.4* 33.8*  PLT 241 335   BMET  Recent Labs  06/11/15 0541 06/12/15 0551  NA 138 135  K 5.0 4.5  CL 105 102  CO2 24 23  GLUCOSE 115* 111*  BUN 6 5*  CREATININE 0.58 0.50  CALCIUM 9.0 9.1   PT/INR No results for input(s): LABPROT, INR in the last 72 hours. ABG No results for input(s): PHART, HCO3 in the last 72 hours.  Invalid input(s): PCO2, PO2  Studies/Results: No results found.  Anti-infectives: Anti-infectives    Start     Dose/Rate Route Frequency Ordered Stop   06/09/15 2000  azithromycin (ZITHROMAX) 500 mg in dextrose 5 % 250 mL IVPB     500 mg 250 mL/hr over 60 Minutes Intravenous Every 24 hours 06/09/15 1839 06/11/15 2310   06/09/15 1900  cefTRIAXone (ROCEPHIN) 1 g in dextrose 5 % 50 mL IVPB     1 g 100 mL/hr over 30 Minutes Intravenous Daily after supper 06/09/15 1839 06/16/15 1759   06/08/15 0600  cefoTEtan in Dextrose 5% (CEFOTAN) IVPB 2 g  Status:  Discontinued     2 g Intravenous On call to O.R. 06/07/15 1414 06/07/15 2019      Assessment/Plan: s/p Procedure(s): EXPLORATORY LAPAROTOMY, LYSIS OF ADHESIONS,  WITH BOWEL RESECTION OFR SMALL BOWEL  INTUSSUSCEPTION (N/A) intussusception of small bowel  POD#5 exploratory laparotomy, LOA, SBR---Dr. Kieth Brightly 06/07/15  Change diliaudid to IM until IV reinserted Add percocet since tolerated clears Will keep on clears today - want to take her a little slow given her extensive bowel history Oob, pulm toilet  Leighton Ruff. Redmond Pulling, MD, FACS General, Bariatric, & Minimally Invasive Surgery [facility], [place]    LOS: 5 days    Gayland Curry 06/12/2015

## 2015-06-13 LAB — CBC
HCT: 38.2 % (ref 36.0–46.0)
HEMOGLOBIN: 12.5 g/dL (ref 12.0–15.0)
MCH: 28 pg (ref 26.0–34.0)
MCHC: 32.7 g/dL (ref 30.0–36.0)
MCV: 85.7 fL (ref 78.0–100.0)
Platelets: 270 10*3/uL (ref 150–400)
RBC: 4.46 MIL/uL (ref 3.87–5.11)
RDW: 20.3 % — AB (ref 11.5–15.5)
WBC: 5.4 10*3/uL (ref 4.0–10.5)

## 2015-06-13 LAB — BASIC METABOLIC PANEL
Anion gap: 8 (ref 5–15)
CALCIUM: 9.3 mg/dL (ref 8.9–10.3)
CHLORIDE: 104 mmol/L (ref 101–111)
CO2: 25 mmol/L (ref 22–32)
CREATININE: 0.6 mg/dL (ref 0.44–1.00)
GFR calc Af Amer: >60 mL/min (ref >60)
GFR calc non Af Amer: >60 mL/min (ref >60)
Glucose, Bld: 102 mg/dL — ABNORMAL HIGH (ref 65–99)
Potassium: 4.7 mmol/L (ref 3.5–5.1)
SODIUM: 137 mmol/L (ref 135–145)

## 2015-06-13 MED ORDER — POLYETHYLENE GLYCOL 3350 17 G PO PACK
17.0000 g | PACK | Freq: Every day | ORAL | Status: DC
  Administered 2015-06-13: 17 g via ORAL
  Filled 2015-06-13 (×2): qty 1

## 2015-06-13 MED ORDER — DOCUSATE SODIUM 100 MG PO CAPS
100.0000 mg | ORAL_CAPSULE | Freq: Two times a day (BID) | ORAL | Status: DC
Start: 2015-06-13 — End: 2015-06-14
  Administered 2015-06-13 – 2015-06-14 (×3): 100 mg via ORAL
  Filled 2015-06-13 (×3): qty 1

## 2015-06-13 MED ORDER — SODIUM CHLORIDE 0.9% FLUSH: 3.0000 mL | INTRAVENOUS | Status: DC | PRN

## 2015-06-13 MED ORDER — WARFARIN - PHARMACIST DOSING INPATIENT: Freq: Every day | Status: DC

## 2015-06-13 MED ORDER — SODIUM CHLORIDE 0.9 % IV SOLN: 250.0000 mL | INTRAVENOUS | Status: DC | PRN

## 2015-06-13 MED ORDER — SODIUM CHLORIDE 0.9% FLUSH
3.0000 mL | Freq: Two times a day (BID) | INTRAVENOUS | Status: DC
  Administered 2015-06-13 – 2015-06-14 (×3): 3 mL via INTRAVENOUS

## 2015-06-13 MED ORDER — ENSURE ENLIVE PO LIQD
237.0000 mL | Freq: Two times a day (BID) | ORAL | Status: DC
  Administered 2015-06-13 – 2015-06-14 (×2): 237 mL via ORAL

## 2015-06-13 MED ORDER — WARFARIN SODIUM 5 MG PO TABS
7.5000 mg | ORAL_TABLET | Freq: Once | ORAL | Status: AC
  Administered 2015-06-13: 7.5 mg via ORAL
  Filled 2015-06-13: qty 1

## 2015-06-13 NOTE — Progress Notes (Signed)
Patient ID: Cynthia Bowen, female   DOB: 1945/05/18, 70 y.o.   MRN: [id number]     [facility]      [address] [place], [place] [address]    Phone: [phone] FAX: [phone]     Subjective: Sore.  No n/v.  Flatus.  No BM. VSS. Afebrile. Ambulating.   Objective:  Vital signs:  Filed Vitals:   06/12/15 1500 06/12/15 2130 06/13/15 0512 06/13/15 0906  BP: 133/69 127/62 108/57 105/58  Pulse: 89 86 83   Temp: 98.3 F (36.8 C) 98.5 F (36.9 C) 98 F (36.7 C)   TempSrc: Oral Oral Oral   Resp: '18 18 18   ' Height:      Weight:      SpO2: 95% 97% 100%     Last BM Date: 06/10/15  Intake/Output   Yesterday:  02/19 0701 - 02/20 0700 In: 360 [P.O.:360] Out: 2650 [Urine:2650] This shift:    I/O last 3 completed shifts: In: 360 [P.O.:360] Out: 4050 [Urine:4050]     Physical Exam: General: Pt awake/alert/oriented x4 in no acute distress Abdomen: Soft. Nondistended. *Mildly tender at incision only. midline incision with approximated wound edges no erythema or drainage. Staples in place. No evidence of peritonitis. No incarcerated hernias.   Problem List:   Principal Problem:   SBO (small bowel obstruction) (HCC) Active Problems:   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 [id number]   History of pulmonary embolism   Protein-calorie malnutrition, severe (HCC)   PAD (peripheral artery disease) (HCC)   Abnormal finding on mammography   Long term (current) use of anticoagulants [Z79.01]   Intussusception intestine (HCC)   Leukocytosis   COPD exacerbation ([place])   Essential hypertension    Results:   Labs: Results for orders placed or performed during the hospital encounter of 06/07/15 (from the past 48 hour(s))  CBC     Status: Abnormal   Collection Time: 06/12/15  5:51 AM  Result Value Ref Range   WBC 6.3 4.0 - 10.5 K/uL   RBC 4.07 3.87 - 5.11 MIL/uL   Hemoglobin 11.6 (L)  12.0 - 15.0 g/dL   HCT 33.8 (L) 36.0 - 46.0 %   MCV 83.0 78.0 - 100.0 fL   MCH 28.5 26.0 - 34.0 pg   MCHC 34.3 30.0 - 36.0 g/dL   RDW 20.3 (H) 11.5 - 15.5 %   Platelets 335 150 - 400 K/uL  Basic metabolic panel     Status: Abnormal   Collection Time: 06/12/15  5:51 AM  Result Value Ref Range   Sodium 135 135 - 145 mmol/L   Potassium 4.5 3.5 - 5.1 mmol/L   Chloride 102 101 - 111 mmol/L   CO2 23 22 - 32 mmol/L   Glucose, Bld 111 (H) 65 - 99 mg/dL   BUN 5 (L) 6 - 20 mg/dL   Creatinine, Ser 0.50 0.44 - 1.00 mg/dL   Calcium 9.1 8.9 - 10.3 mg/dL   GFR calc non Af Amer >60 >60 mL/min   GFR calc Af Amer >60 >60 mL/min    Comment: (NOTE) The eGFR has been calculated using the CKD EPI equation. This calculation has not been validated in all clinical situations. eGFR's persistently <60 mL/min signify possible Chronic Kidney Disease.    Anion gap 10 5 - 15  CBC     Status: Abnormal   Collection Time: 06/13/15  5:19 AM  Result Value Ref Range   WBC  5.4 4.0 - 10.5 K/uL   RBC 4.46 3.87 - 5.11 MIL/uL   Hemoglobin 12.5 12.0 - 15.0 g/dL   HCT 38.2 36.0 - 46.0 %   MCV 85.7 78.0 - 100.0 fL   MCH 28.0 26.0 - 34.0 pg   MCHC 32.7 30.0 - 36.0 g/dL   RDW 20.3 (H) 11.5 - 15.5 %   Platelets 270 150 - 400 K/uL  Basic metabolic panel     Status: Abnormal   Collection Time: 06/13/15  5:19 AM  Result Value Ref Range   Sodium 137 135 - 145 mmol/L   Potassium 4.7 3.5 - 5.1 mmol/L   Chloride 104 101 - 111 mmol/L   CO2 25 22 - 32 mmol/L   Glucose, Bld 102 (H) 65 - 99 mg/dL   BUN <5 (L) 6 - 20 mg/dL   Creatinine, Ser 0.60 0.44 - 1.00 mg/dL   Calcium 9.3 8.9 - 10.3 mg/dL   GFR calc non Af Amer >60 >60 mL/min   GFR calc Af Amer >60 >60 mL/min    Comment: (NOTE) The eGFR has been calculated using the CKD EPI equation. This calculation has not been validated in all clinical situations. eGFR's persistently <60 mL/min signify possible Chronic Kidney Disease.    Anion gap 8 5 - 15    Imaging /  Studies: No results found.  Medications / Allergies:  Scheduled Meds: . amLODipine  5 mg Oral Daily  . amoxicillin-clavulanate  1 tablet Oral Q12H  . enoxaparin (LOVENOX) injection  40 mg Subcutaneous Q12H  . pantoprazole (PROTONIX) IV  40 mg Intravenous Q24H   Continuous Infusions: . dexrose 5 % and 0.45 % NaCl with KCl 30 mEq/L 100 mL/hr at 06/13/15 0651   PRN Meds:.acetaminophen **OR** acetaminophen, albuterol, HYDROmorphone (DILAUDID) injection, ondansetron **OR** ondansetron (ZOFRAN) IV, oxyCODONE-acetaminophen  Antibiotics: Anti-infectives    Start     Dose/Rate Route Frequency Ordered Stop   06/12/15 1000  amoxicillin-clavulanate (AUGMENTIN) 875-125 MG per tablet 1 tablet     1 tablet Oral Every 12 hours 06/12/15 0943 06/15/15 0959   06/09/15 2000  azithromycin (ZITHROMAX) 500 mg in dextrose 5 % 250 mL IVPB     500 mg 250 mL/hr over 60 Minutes Intravenous Every 24 hours 06/09/15 1839 06/11/15 2310   06/09/15 1900  cefTRIAXone (ROCEPHIN) 1 g in dextrose 5 % 50 mL IVPB  Status:  Discontinued     1 g 100 mL/hr over 30 Minutes Intravenous Daily after supper 06/09/15 1839 06/12/15 0943   06/08/15 0600  cefoTEtan in Dextrose 5% (CEFOTAN) IVPB 2 g  Status:  Discontinued     2 g Intravenous On call to O.R. 06/07/15 1414 06/07/15 2019          Assessment/Plan intussusception of small bowel  POD#6 exploratory laparotomy, LOA, SBR---Dr. Kieth Brightly 06/07/15 -advance to fulls -mobilize, IS VTE prophylaxis-SCD/heparin FEN-fulls, SLIV.  Add colace/miralax. Dispo-anticipate DC 24-28h   Erby Pian, [facility] Pager [phone](7A-4:30P)   06/13/2015 9:07 AM

## 2015-06-13 NOTE — Progress Notes (Signed)
[facility] PROGRESS NOTE  Cynthia Bowen [id number] DOB: June 28, 1945 DOA: 06/07/2015 PCP: Cynthia Koyanagi, MD  Summary 06/08/15: I have seen and examined Cynthia Bowen at bedside and reviewed her chart. Appreciate general surgery. Spoke with Dr. Kieth Brightly.Cynthia Bowen is a pleasant 70 year old female with history of PE/anticoagulation on hold perioperatively, who was admitted with intussusception status post bowel resection with reanastomosis on 06/07/15. She complains of pain and wonders if she can have something to drink. Not passing gas yet. Her white count is elevated and this is concerning, hopefully all reactionary. Will have low threshold to add antibiotics if suggestion of infection. Will add Protonix meanwhile, transfer to telemetry when bed becomes available and defer rest of management to general surgery.  06/09/15: Appreciate gen surgery. Cynthia Bowen had NGT removed today. She appears to have acute COPD Exacerbation with cough productive of brown sputum; CXR shows no infiltrate. Will start Ceftriaxone/Zithromax/Albuterol, and defer post opsurgical management to gen surgery team. Cynthia Bowen will resume anticoagulation when ok from surgical stand point. 06/10/15: Appreciate gen surgery. Cynthia Bowen taking sips. White count has improved 20.100>>13.200>>10.500. Cynthia Bowen will start anticoagulation-Lovenox for now. Appreciate pharmacy. Cynthia Bowen complaints of abdominal pain, less cough. 06/11/15: Appreciate general surgery. Diet advanced to clear liquids. White count continues to improve 20.100>>13.200>>10.500>>8.100. Hemoglobin stable. No evidence of bleeding. Continue antibiotics for COPD exacerbation, therapeutic doses of Lovenox with plans to resume Coumadin when Cynthia Bowen can take oral more consistently, and adjust diet per general surgery. Cynthia Bowen has no complaints. 06/12/15: Appreciate general surgery. Cynthia Bowen lost IV access(IV team called to replace access). White count continues to improve. BP  uncontrolled. Will change antibiotics to Augmentin, start Norvasc and continue rest of management per general surgery. Cynthia Bowen complains of abdominal pain. 06/13/15: Appreciate general surgery. Cynthia Bowen can spell and will get laxatives today. There is no evidence of bleeding therefore will add Coumadin for target INR 2-3 per pharmacy. Plan SBO (small bowel obstruction) (HCC)/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 March 2014/Intussusception intestine (HCC)/Leukocytosis  On full liquid diet  Defer management to general surgery History of pulmonary embolism/Long term (current) use of anticoagulants [Z79.01]  On Lovenox bridge  Start Coumadin per pharmacy for target INR 2-3 PAD (peripheral artery disease) (HCC)/Abnormal finding on mammography/essential hypertension  Norvasc Hypokalemia/Severe malnutrition  Replenish potassium as necessary  Nutritional supplements when Cynthia Bowen can take oral. COPD Exacerbation  Continue Augmentin  Continue Albuterol Code Status: Full code Family Communication: None at bedside Disposition Plan: Prefers to go home, probably mid this week   Consultants:  Gen. surgery  Procedures:  Bowel resection with reanastomosis on 06/07/15  Antibiotics:  Ceftriaxone 06/09/15>>06/12/15  Zithromax 06/09/15>>06/11/15  Augmentin 06/12/15>>  HPI/Subjective: Feels better, complains of constipation  Objective: Filed Vitals:   06/13/15 0512 06/13/15 0906  BP: 108/57 105/58  Pulse: 83   Temp: 98 F (36.7 C)   Resp: 18     Intake/Output Summary (Last 24 hours) at 06/13/15 1321 Last data filed at 06/13/15 1002  Gross per 24 hour  Intake    340 ml  Output   2050 ml  Net  -1710 ml   Filed Weights   06/07/15 2039  Weight: 40.1 kg (88 lb 6.5 oz)    Exam:   General:  Comfortable at rest.  Cardiovascular: S1-S2 normal. No murmurs. Pulse regular.  Respiratory: Good air entry bilaterally. No rhonchi or  rales.  Abdomen: Soft and nontender. Normal bowel sounds. No organomegaly.  Musculoskeletal: No pedal edema   Neurological: Intact  Data Reviewed: Basic Metabolic  Panel:  Recent Labs Lab 06/08/15 0308 06/09/15 0448 06/10/15 0456 06/11/15 0541 06/12/15 0551 06/13/15 0519  NA 143 149* 140 138 135 137  K 3.1* 3.7 3.5 5.0 4.5 4.7  CL 109 114* 103 105 102 104  CO2 24 20* 28 24 23 25   GLUCOSE 109* 82 113* 115* 111* 102*  BUN 14 12 6 6  5* <5*  CREATININE 0.49 0.47 0.43* 0.58 0.50 0.60  CALCIUM 8.3* 8.6* 8.7* 9.0 9.1 9.3  MG 1.5* 2.0  --   --   --   --   PHOS 3.1 1.9*  --   --   --   --    Liver Function Tests:  Recent Labs Lab 06/06/15 1527 06/07/15 0955 06/08/15 0308 06/10/15 0456 06/11/15 0541  AST 26 32 25 20 28   ALT 20 23 20 22 20   ALKPHOS 93 90 67 58 61  BILITOT 0.5 0.5 0.7 0.5 0.8  PROT 7.4 7.7 6.3* 6.4* 6.5  ALBUMIN 3.6 4.1 3.1* 3.1* 3.0*    Recent Labs Lab 06/06/15 1527  LIPASE 17   No results for input(s): AMMONIA in the last 168 hours. CBC:  Recent Labs Lab 06/07/15 0955  06/09/15 0448 06/10/15 0456 06/11/15 0541 06/12/15 0551 06/13/15 0519  WBC 11.7*  < > 13.2* 10.5 8.1 6.3 5.4  NEUTROABS 8.9*  --   --  8.1*  --   --   --   HGB 13.4  < > 9.7* 10.2* 10.8* 11.6* 12.5  HCT 40.0  < > 30.0* 31.0* 32.4* 33.8* 38.2  MCV 83.9  < > 86.2 86.6 84.6 83.0 85.7  PLT 243  < > 188 249 241 335 270  < > = values in this interval not displayed. Cardiac Enzymes: No results for input(s): CKTOTAL, CKMB, CKMBINDEX, TROPONINI in the last 168 hours. BNP (last 3 results) No results for input(s): BNP in the last 8760 hours.  ProBNP (last 3 results) No results for input(s): PROBNP in the last 8760 hours.  CBG: No results for input(s): GLUCAP in the last 168 hours.  Recent Results (from the past 240 hour(s))  MRSA PCR Screening     Status: None   Collection Time: 06/07/15  9:00 PM  Result Value Ref Range Status   MRSA by PCR NEGATIVE NEGATIVE Final     Comment:        The GeneXpert MRSA Assay (FDA approved for NASAL specimens only), is one component of a comprehensive MRSA colonization surveillance program. It is not intended to diagnose MRSA infection nor to guide or monitor treatment for MRSA infections.   Culture, Urine     Status: None   Collection Time: 06/08/15  4:55 AM  Result Value Ref Range Status   Specimen Description URINE, CATHETERIZED  Final   Special Requests Normal  Final   Culture   Final    NO GROWTH 1 DAY Performed at [facility]    Report Status 06/09/2015 FINAL  Final     Studies: No results found.  Scheduled Meds: . amLODipine  5 mg Oral Daily  . amoxicillin-clavulanate  1 tablet Oral Q12H  . docusate sodium  100 mg Oral BID  . enoxaparin (LOVENOX) injection  40 mg Subcutaneous Q12H  . feeding supplement (ENSURE ENLIVE)  237 mL Oral BID BM  . pantoprazole (PROTONIX) IV  40 mg Intravenous Q24H  . polyethylene glycol  17 g Oral Daily  . sodium chloride flush  3 mL Intravenous Q12H   Continuous Infusions:  Time spent: 25 minutes    Shellie Rogoff  [facility] Pager [phone]. If 7PM-7AM, please contact night-coverage at [email or web address], password [facility] 06/13/2015, 1:21 PM  LOS: 6 days

## 2015-06-13 NOTE — Care Management Important Message (Signed)
Important Message  Patient Details  Name: SHEMIAH DEERY MRN: [id number] Date of Birth: 1945/05/14   Medicare Important Message Given:  Yes    Camillo Flaming 06/13/2015, 3:02 [place] Message  Patient Details  Name: VALLI RIDENER MRN: [id number] Date of Birth: [birth year]   Medicare Important Message Given:  Yes    Camillo Flaming 06/13/2015, 3:02 PM

## 2015-06-13 NOTE — Progress Notes (Signed)
ANTICOAGULATION CONSULT NOTE - Initial Consult  Pharmacy Consult for Warfarin Indication: h/o PE  Allergies  Allergen Reactions  . Avelox [Moxifloxacin Hcl In Nacl] Nausea And Vomiting  . Betadine [Povidone Iodine] Itching and Rash  . Alendronate Sodium Nausea And Vomiting and Other (See Comments)    dizziness  . Aspirin Nausea Only  . Codeine Nausea And Vomiting  . Doxycycline Nausea And Vomiting  . Fluconazole Nausea And Vomiting  . Hydrocodone Nausea And Vomiting    GI distress  . Hydrocodone-Acetaminophen Nausea And Vomiting  . Morphine And Related Nausea Only  . Neurontin [Gabapentin] Other (See Comments)    Mood changes   . Nsaids Other (See Comments)    Severe gastritis & perforation - avoid NSAIDs when possible  . Quinolones Hives and Itching  . Sertraline Hcl Nausea And Vomiting and Other (See Comments)    Hallucinations   . Sulfamethoxazole Hives and Itching  . Latex Rash  . Sulfa Antibiotics Rash    Patient Measurements: Height: 5\' 5"  (165.1 cm) Weight: 88 lb 6.5 oz (40.1 kg) IBW/kg (Calculated) : 57 Heparin Dosing Weight:   Vital Signs: Temp: 98 F (36.7 C) (02/20 0512) Temp Source: Oral (02/20 0512) BP: 105/58 mmHg (02/20 0906) Pulse Rate: 83 (02/20 0512)  Labs:  Recent Labs  06/11/15 0541 06/12/15 0551 06/13/15 0519  HGB 10.8* 11.6* 12.5  HCT 32.4* 33.8* 38.2  PLT 241 335 270  CREATININE 0.58 0.50 0.60    Estimated Creatinine Clearance: 42.6 mL/min (by C-G formula based on Cr of 0.6).   Medical History: Past Medical History  Diagnosis Date  . COPD (chronic obstructive pulmonary disease) ([place])   . Pneumonia 12-2011  . GERD (gastroesophageal reflux disease)   . Headache(784.0)   . Arthritis     osteoarthritis  . Allergy   . Depression   . Neuromuscular disorder ([place])   . Osteoporosis   . Bronchitis     CURRENTLY AS OF 06/30/12 - HAS COUGH AND FINISHED ANTIBIOTIC FOR BRONCHITIS  . Fibromyalgia   . Pain     ABDOMINAL PAIN AND  NAUSEA  . Pain     SOMETIMES PAIN RIGHT EAR AND NECK--STATES CAUSED BY A "LUMP" ON BACK OF EAR--USES KENALOG CREAM TOPICALLY AS NEEDED.  Marland Kitchen Gastrocutaneous fistula   . Anemia   . Anxiety   . Blood transfusion without reported diagnosis   . Heart murmur     young  . Peripheral vascular disease (HCC)     hx  ?leg  . History of kidney stones   . Pulmonary embolism (HCC)      Assessment: Cynthia Bowen on warfarin PTA for h/o PE which was bridged to Lovenox in anticipation of surgery.  On 2/14 pt underwent small bowel resection for intussusception.  Beginning the evening of POD0, SQ heparin was initiated then Lovenox 40 mg SQ q12h was resumed 2/18 (POD4).  Pharmacy consulted to resume warfarin 2/20.  PTA warfarin dose 5 mg daily except 2.5 on Mondays and Fridays per clinic notes  INR 1.05 on 2/16 Hgb, plts WNL SCr stable, CrCl~42 ml/min Advanced to FLD  Goal of Therapy:  INR 2-3 Monitor platelets by anticoagulation protocol: Yes   Plan:  Warfarin 7.5 mg once today then plan to resume home dosing tomorrow. Continue Lovenox 40 mg SQ q12h while INR subtherapeutic. Daily INR.  Cynthia Bowen 06/13/2015,2:00 PM

## 2015-06-13 NOTE — Progress Notes (Signed)
Nutrition Follow-up  DOCUMENTATION CODES:   Severe malnutrition in context of chronic illness, Underweight  INTERVENTION:  - Will order Ensure Enlive po BID, each supplement provides 350 kcal and 20 grams of protein - Continue diet advancement as tolerated/medically feasible - RD will continue to monitor for needs  NUTRITION DIAGNOSIS:   Malnutrition related to chronic illness as evidenced by severe depletion of muscle mass, severe depletion of body fat -ongoing  GOAL:   Patient will meet greater than or equal to 90% of their needs -unmet with advancing diet  MONITOR:   PO intake, Supplement acceptance, Diet advancement, Weight trends, Labs, Skin, I & O's  ASSESSMENT:   GWENNETTE Bowen is a 70 y.o. female with a past medical history of peripheral vascular disease, right lower extremity chronic wound, history of pulmonary embolism on warfarin, previous history of gastric perforation requiring surgery and omental patch, who presented to the hospital with complaints of nausea, vomiting and abdominal pain ongoing for 2 days  2/20 Diet advancement as follows:  2/16 @ 0903: NPO 2/18 @ 0923: CLD 2/20 @ 0907: FLD  Per chart review, pt consumed 0% of breakfast and lunch 2/18, 10% of breakfast and 0% of lunch yesterday (2/19). Pt on the phone with [facility] to order lunch at time of RD visit. She states that she has been tolerating CLD without issue, no N/V or abdominal pain. She states that lunch will be her first trial with FLD and she has no concerns surrounding this.  Pt not meeting needs. Will order Ensure to supplement and adjust as needed. Medications reviewed. Labs reviewed; BUN <5 mg/dL.    2/15 - Pt stated that she has not eaten much in 1-2 months related to general weakness/malaise and abdominal pain, poor appetite. - According to MD note, she lost 20# in 2 years. - Per chart, her weight has been relatively stable over he past 2 months. - Nutrition-Focused physical exam  completed. Findings are severe fat depletion, severe muscle depletion, and no edema.   Diet Order:  Diet full liquid Room service appropriate?: Yes; Fluid consistency:: Thin  Skin:   Abdominal incision from bowel resection and reanastomosis 06/07/15  Last BM:  2/17  Height:   Ht Readings from Last 1 Encounters:  06/07/15 5\' 5"  (1.651 m)    Weight:   Wt Readings from Last 1 Encounters:  06/07/15 88 lb 6.5 oz (40.1 kg)    Ideal Body Weight:  56.81 kg  BMI:  Body mass index is 14.71 kg/(m^2).  Estimated Nutritional Needs:   Kcal:  1400-1700 calories  Protein:  60-70 grams  Fluid:  >/= 1.4L  EDUCATION NEEDS:   No education needs identified at this time     Jarome Matin, RD, LDN Inpatient Clinical Dietitian Pager # [phone] After hours/weekend pager # [phone]

## 2015-06-14 LAB — PROTIME-INR
INR: 0.94 (ref 0.00–1.49)
Prothrombin Time: 12.8 seconds (ref 11.6–15.2)

## 2015-06-14 LAB — BASIC METABOLIC PANEL
ANION GAP: 10 (ref 5–15)
BUN: 5 mg/dL — AB (ref 6–20)
CHLORIDE: 102 mmol/L (ref 101–111)
CO2: 26 mmol/L (ref 22–32)
Calcium: 9.7 mg/dL (ref 8.9–10.3)
Creatinine, Ser: 0.77 mg/dL (ref 0.44–1.00)
GFR calc Af Amer: >60 mL/min (ref >60)
GLUCOSE: 73 mg/dL (ref 65–99)
POTASSIUM: 4.1 mmol/L (ref 3.5–5.1)
Sodium: 138 mmol/L (ref 135–145)

## 2015-06-14 LAB — CBC
HEMATOCRIT: 38.1 % (ref 36.0–46.0)
HEMOGLOBIN: 12.8 g/dL (ref 12.0–15.0)
MCH: 28.5 pg (ref 26.0–34.0)
MCHC: 33.6 g/dL (ref 30.0–36.0)
MCV: 84.9 fL (ref 78.0–100.0)
PLATELETS: 384 10*3/uL (ref 150–400)
RBC: 4.49 MIL/uL (ref 3.87–5.11)
RDW: 19.9 % — ABNORMAL HIGH (ref 11.5–15.5)
WBC: 5.2 10*3/uL (ref 4.0–10.5)

## 2015-06-14 LAB — APTT: aPTT: 23 seconds — ABNORMAL LOW (ref 24–37)

## 2015-06-14 MED ORDER — TRAMADOL HCL 50 MG PO TABS: 100.0000 mg | ORAL_TABLET | Freq: Four times a day (QID) | ORAL | Status: DC | PRN

## 2015-06-14 MED ORDER — ACETAMINOPHEN 500 MG PO TABS: 500.0000 mg | ORAL_TABLET | Freq: Three times a day (TID) | ORAL | Status: AC | PRN

## 2015-06-14 MED ORDER — WARFARIN SODIUM 5 MG PO TABS
5.0000 mg | ORAL_TABLET | Freq: Once | ORAL | Status: AC
  Administered 2015-06-14: 5 mg via ORAL
  Filled 2015-06-14: qty 1

## 2015-06-14 MED ORDER — AMOXICILLIN-POT CLAVULANATE 875-125 MG PO TABS: 1.0000 | ORAL_TABLET | Freq: Two times a day (BID) | ORAL | Status: DC

## 2015-06-14 MED ORDER — DOCUSATE SODIUM 100 MG PO CAPS: 100.0000 mg | ORAL_CAPSULE | Freq: Two times a day (BID) | ORAL | Status: AC

## 2015-06-14 MED ORDER — AMLODIPINE BESYLATE 5 MG PO TABS
5.0000 mg | ORAL_TABLET | Freq: Every day | ORAL | Status: DC
Start: 2015-06-14 — End: 2015-07-29

## 2015-06-14 NOTE — Progress Notes (Signed)
ANTICOAGULATION CONSULT NOTE - Initial Consult  Pharmacy Consult for Warfarin Indication: h/o PE  Allergies  Allergen Reactions  . Avelox [Moxifloxacin Hcl In Nacl] Nausea And Vomiting  . Betadine [Povidone Iodine] Itching and Rash  . Alendronate Sodium Nausea And Vomiting and Other (See Comments)    dizziness  . Aspirin Nausea Only  . Codeine Nausea And Vomiting  . Doxycycline Nausea And Vomiting  . Fluconazole Nausea And Vomiting  . Hydrocodone Nausea And Vomiting    GI distress  . Hydrocodone-Acetaminophen Nausea And Vomiting  . Morphine And Related Nausea Only  . Neurontin [Gabapentin] Other (See Comments)    Mood changes   . Nsaids Other (See Comments)    Severe gastritis & perforation - avoid NSAIDs when possible  . Quinolones Hives and Itching  . Sertraline Hcl Nausea And Vomiting and Other (See Comments)    Hallucinations   . Sulfamethoxazole Hives and Itching  . Latex Rash  . Sulfa Antibiotics Rash    Patient Measurements: Height: 5\' 5"  (165.1 cm) Weight: 88 lb 6.5 oz (40.1 kg) IBW/kg (Calculated) : 57 Heparin Dosing Weight:   Vital Signs: Temp: 98.1 F (36.7 C) (02/21 0625) Temp Source: Oral (02/21 0625) BP: 118/56 mmHg (02/21 0625) Pulse Rate: 75 (02/21 0625)  Labs:  Recent Labs  06/12/15 0551 06/13/15 0519 06/14/15 0522  HGB 11.6* 12.5 12.8  HCT 33.8* 38.2 38.1  PLT 335 270 384  APTT  --   --  23*  LABPROT  --   --  12.8  INR  --   --  0.94  CREATININE 0.50 0.60 0.77    Estimated Creatinine Clearance: 42.6 mL/min (by C-G formula based on Cr of 0.77).   Medical History: Past Medical History  Diagnosis Date  . COPD (chronic obstructive pulmonary disease) ([place])   . Pneumonia 12-2011  . GERD (gastroesophageal reflux disease)   . Headache(784.0)   . Arthritis     osteoarthritis  . Allergy   . Depression   . Neuromuscular disorder ([place])   . Osteoporosis   . Bronchitis     CURRENTLY AS OF 06/30/12 - HAS COUGH AND FINISHED ANTIBIOTIC FOR  BRONCHITIS  . Fibromyalgia   . Pain     ABDOMINAL PAIN AND NAUSEA  . Pain     SOMETIMES PAIN RIGHT EAR AND NECK--STATES CAUSED BY A "LUMP" ON BACK OF EAR--USES KENALOG CREAM TOPICALLY AS NEEDED.  Marland Kitchen Gastrocutaneous fistula   . Anemia   . Anxiety   . Blood transfusion without reported diagnosis   . Heart murmur     young  . Peripheral vascular disease (HCC)     hx  ?leg  . History of kidney stones   . Pulmonary embolism (HCC)      Assessment: 80 yoF on warfarin PTA for h/o PE which was bridged to Lovenox in anticipation of surgery.  On 2/14 pt underwent small bowel resection for intussusception.  Beginning the evening of POD0, SQ heparin was initiated then Lovenox 40 mg SQ q12h was resumed 2/18 (POD4).  Pharmacy consulted to resume warfarin 2/20.  PTA warfarin dose 5 mg daily except 2.5 on Mondays and Fridays per clinic notes  INR 0.94 on 2/21 Hgb, plts WNL SCr stable, CrCl~42 ml/min Advanced to soft diet  Goal of Therapy:  INR 2-3 Monitor platelets by anticoagulation protocol: Yes   Plan:  Warfarin 5mg  at 1800, resume home warfarin dosing as above. Continue Lovenox 40 mg SQ q12h while INR subtherapeutic. Monitor for s/s of  bleeding Daily INR.  Darvin Neighbours, [facility] PharmD Candidate 06/14/2015,12:44 PM

## 2015-06-14 NOTE — Progress Notes (Signed)
Patient ID: Cynthia Bowen, female   DOB: 1945-11-14, 70 y.o.   MRN: [id number]     [address] [place], [place] [address]    Phone: [phone] FAX: [phone]     Subjective: Had 2 BMs.  Afebrile.  VSS.  Ambulating. Pain controlled.   Objective:  Vital signs:  Filed Vitals:   06/13/15 0512 06/13/15 0906 06/13/15 1437 06/14/15 0625  BP: 108/57 105/58 138/54 118/56  Pulse: 83  84 75  Temp: 98 F (36.7 C)  98 F (36.7 C) 98.1 F (36.7 C)  TempSrc: Oral  Oral Oral  Resp: '18  18 16  ' Height:      Weight:      SpO2: 100%  100% 99%    Last BM Date: 06/14/15  Intake/Output   Yesterday:  02/20 0701 - 02/21 0700 In: 340 [P.O.:340] Out: 1150 [Urine:1150] This shift:      Physical Exam: General: Pt awake/alert/oriented x4 in no acute distress Abdomen: Soft. Nondistended. Mildly tender at incision only. midline incision with approximated wound edges no erythema or drainage. Staples in place. No evidence of peritonitis. No incarcerated hernias.  Problem List:   Principal Problem:   SBO (small bowel obstruction) (HCC) Active Problems:   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 [id number]   History of pulmonary embolism   Protein-calorie malnutrition, severe (HCC)   PAD (peripheral artery disease) (HCC)   Abnormal finding on mammography   Long term (current) use of anticoagulants [Z79.01]   Intussusception intestine (HCC)   Leukocytosis   COPD exacerbation ([place])   Essential hypertension    Results:   Labs: Results for orders placed or performed during the hospital encounter of 06/07/15 (from the past 48 hour(s))  CBC     Status: Abnormal   Collection Time: 06/13/15  5:19 AM  Result Value Ref Range   WBC 5.4 4.0 - 10.5 K/uL   RBC 4.46 3.87 - 5.11 MIL/uL   Hemoglobin 12.5 12.0 - 15.0 g/dL   HCT 38.2 36.0 - 46.0 %   MCV 85.7 78.0 - 100.0 fL   MCH 28.0 26.0  - 34.0 pg   MCHC 32.7 30.0 - 36.0 g/dL   RDW 20.3 (H) 11.5 - 15.5 %   Platelets 270 150 - 400 K/uL  Basic metabolic panel     Status: Abnormal   Collection Time: 06/13/15  5:19 AM  Result Value Ref Range   Sodium 137 135 - 145 mmol/L   Potassium 4.7 3.5 - 5.1 mmol/L   Chloride 104 101 - 111 mmol/L   CO2 25 22 - 32 mmol/L   Glucose, Bld 102 (H) 65 - 99 mg/dL   BUN <5 (L) 6 - 20 mg/dL   Creatinine, Ser 0.60 0.44 - 1.00 mg/dL   Calcium 9.3 8.9 - 10.3 mg/dL   GFR calc non Af Amer >60 >60 mL/min   GFR calc Af Amer >60 >60 mL/min    Comment: (NOTE) The eGFR has been calculated using the CKD EPI equation. This calculation has not been validated in all clinical situations. eGFR's persistently <60 mL/min signify possible Chronic Kidney Disease.    Anion gap 8 5 - 15  CBC     Status: Abnormal   Collection Time: 06/14/15  5:22 AM  Result Value Ref Range   WBC 5.2 4.0 - 10.5 K/uL   RBC 4.49 3.87 - 5.11 MIL/uL   Hemoglobin 12.8  12.0 - 15.0 g/dL   HCT 38.1 36.0 - 46.0 %   MCV 84.9 78.0 - 100.0 fL   MCH 28.5 26.0 - 34.0 pg   MCHC 33.6 30.0 - 36.0 g/dL   RDW 19.9 (H) 11.5 - 15.5 %   Platelets 384 150 - 400 K/uL  Basic metabolic panel     Status: Abnormal   Collection Time: 06/14/15  5:22 AM  Result Value Ref Range   Sodium 138 135 - 145 mmol/L   Potassium 4.1 3.5 - 5.1 mmol/L   Chloride 102 101 - 111 mmol/L   CO2 26 22 - 32 mmol/L   Glucose, Bld 73 65 - 99 mg/dL   BUN 5 (L) 6 - 20 mg/dL   Creatinine, Ser 0.77 0.44 - 1.00 mg/dL   Calcium 9.7 8.9 - 10.3 mg/dL   GFR calc non Af Amer >60 >60 mL/min   GFR calc Af Amer >60 >60 mL/min    Comment: (NOTE) The eGFR has been calculated using the CKD EPI equation. This calculation has not been validated in all clinical situations. eGFR's persistently <60 mL/min signify possible Chronic Kidney Disease.    Anion gap 10 5 - 15  APTT     Status: Abnormal   Collection Time: 06/14/15  5:22 AM  Result Value Ref Range   aPTT 23 (L) 24 - 37  seconds  Protime-INR     Status: None   Collection Time: 06/14/15  5:22 AM  Result Value Ref Range   Prothrombin Time 12.8 11.6 - 15.2 seconds   INR 0.94 0.00 - 1.49    Imaging / Studies: No results found.  Medications / Allergies:  Scheduled Meds: . amLODipine  5 mg Oral Daily  . amoxicillin-clavulanate  1 tablet Oral Q12H  . docusate sodium  100 mg Oral BID  . enoxaparin (LOVENOX) injection  40 mg Subcutaneous Q12H  . feeding supplement (ENSURE ENLIVE)  237 mL Oral BID BM  . pantoprazole (PROTONIX) IV  40 mg Intravenous Q24H  . polyethylene glycol  17 g Oral Daily  . sodium chloride flush  3 mL Intravenous Q12H  . Warfarin - Pharmacist Dosing Inpatient   Does not apply q1800   Continuous Infusions:  PRN Meds:.sodium chloride, acetaminophen **OR** acetaminophen, albuterol, HYDROmorphone (DILAUDID) injection, ondansetron **OR** ondansetron (ZOFRAN) IV, oxyCODONE-acetaminophen, sodium chloride flush  Antibiotics: Anti-infectives    Start     Dose/Rate Route Frequency Ordered Stop   06/12/15 1000  amoxicillin-clavulanate (AUGMENTIN) 875-125 MG per tablet 1 tablet     1 tablet Oral Every 12 hours 06/12/15 0943 06/15/15 0959   06/09/15 2000  azithromycin (ZITHROMAX) 500 mg in dextrose 5 % 250 mL IVPB     500 mg 250 mL/hr over 60 Minutes Intravenous Every 24 hours 06/09/15 1839 06/11/15 2310   06/09/15 1900  cefTRIAXone (ROCEPHIN) 1 g in dextrose 5 % 50 mL IVPB  Status:  Discontinued     1 g 100 mL/hr over 30 Minutes Intravenous Daily after supper 06/09/15 1839 06/12/15 0943   06/08/15 0600  cefoTEtan in Dextrose 5% (CEFOTAN) IVPB 2 g  Status:  Discontinued     2 g Intravenous On call to O.R. 06/07/15 1414 06/07/15 2019          Assessment/Plan intussusception of small bowel  POD#7 exploratory laparotomy, LOA, SBR---Dr. Kieth Brightly 06/07/15 -advance to soft diet, boost  -mobilize, IS VTE prophylaxis-SCD/heparin FEN-solids, colace/miralax. Dispo-can DC today if able to  tolerate soft diet. Will arrange f/u with our office  for staples removal and post op check  Erby Pian, [facility] Surgery Pager [phone])   06/14/2015 9:03 AM

## 2015-06-14 NOTE — Discharge Instructions (Signed)

## 2015-06-14 NOTE — Discharge Summary (Addendum)
Cynthia Bowen, is a 70 y.o. female  DOB [birth year]  MRN [id number].  Admission date:  06/07/2015  Admitting Physician  Bonnielee Haff, MD  Discharge Date:  06/14/2015   Primary MD  [name] Koyanagi, MD  Recommendations for primary care physician for things to follow:  Please transition patient back to Coumadin when appropriate  Admission Diagnosis   SBO (small bowel obstruction) ([place]) [K56.69] Renal mass [N28.89]   Discharge Diagnosis  SBO (small bowel obstruction) ([place]) [K56.69] Renal mass [N28.89]   Principal Problem:   SBO (small bowel obstruction) ([place]) Active Problems:   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 [id number]   Intussusception intestine ([place])   Leukocytosis   History of pulmonary embolism   Long term (current) use of anticoagulants [Z79.01]   Protein-calorie malnutrition, severe (HCC)   PAD (peripheral artery disease) (HCC)   Abnormal finding on mammography   COPD exacerbation ([place])   Essential hypertension      Summary Cynthia Bowen is a pleasant 70 year old female with history of PE/anticoagulation on hold perioperatively(for pending ankle surgery), who was admitted with intussusception and is now status post bowel resection with reanastomosis on 06/07/15 by Dr Kieth Brightly. She was also found to have acute exacerbation of COPD therefore was given bronchodilators and antibiotics. She had postop recovery as expected and is eager to go home. She will complete 2 more days of Augmentin for COPD exacerbation, and will continue Lovenox until ankle surgery done. Cynthia Bowen will follow with her primary care physician on the 24th to continue logistics regarding ankle surgery. Will defer decision regarding transitioning patient back to Coumadin to her PCP. She is  discharged in stable condition to follow with general surgery as scheduled. Please refer to the daily progress notes below for details of patient's hospital stay;  Hospital Course  06/08/15: I have seen and examined Cynthia Bowen at bedside and reviewed her chart. Appreciate general surgery. Spoke with Dr. Kieth Brightly.Cynthia Bowen is a pleasant 70 year old female with history of PE/anticoagulation on hold perioperatively, who was admitted with intussusception status post bowel resection with reanastomosis on 06/07/15. She complains of pain and wonders if she can have something to drink. Not passing gas yet. Her white count is elevated and this is concerning, hopefully all reactionary. Will have low threshold to add antibiotics if suggestion of infection. Will add Protonix meanwhile, transfer to telemetry when bed becomes available and defer rest of management to general surgery.  06/09/15: Appreciate gen surgery. Patient had NGT removed today. She appears to have acute COPD Exacerbation with cough productive of brown sputum; CXR shows no infiltrate. Will start Ceftriaxone/Zithromax/Albuterol, and defer post opsurgical management to gen surgery team. Patient will resume anticoagulation when ok from surgical stand point. 06/10/15: Appreciate gen surgery. Patient taking sips. White count has improved 20.100>>13.200>>10.500. Patient will start anticoagulation-Lovenox for now. Appreciate pharmacy. Patient complaints of abdominal pain, less cough. 06/11/15: Appreciate general surgery. Diet advanced to clear liquids. White count continues to improve 20.100>>13.200>>10.500>>8.100. Hemoglobin stable. No evidence of bleeding. Continue  antibiotics for COPD exacerbation, therapeutic doses of Lovenox with plans to resume Coumadin when patient can take oral more consistently, and adjust diet per general surgery. Patient has no complaints. 06/12/15: Appreciate general surgery. Patient lost IV access(IV team called to replace access).  White count continues to improve. BP uncontrolled. Will change antibiotics to Augmentin, start Norvasc and continue rest of management per general surgery. Patient complains of abdominal pain. 06/13/15: Appreciate general surgery. Patient can spell and will get laxatives today. There is no evidence of bleeding therefore will add Coumadin for target INR 2-3 per pharmacy. 06/14/15: Tolerating feeding and is here to go home. Will d/c today.  Discharge Condition Stable.  Consults obtained  General surgery  Follow UP Follow-up Information    Follow up with Mickeal Skinner, MD On 06/29/2015.   Specialty:  General Surgery   Why:  arrive by 11:15AM for a 11:45AM post op check with your surgeon   Contact information:   [place] [address] [phone]       Follow up with [facility] On 06/21/2015.   Specialty:  General Surgery   Why:  appt time: 10AM with Dr. Amie [place] nurse to have your staples removed.    Contact information:   [address] [address] [phone]         Discharge Instructions  and  Discharge Medications      Discharge Instructions    Diet - low sodium heart healthy    Complete by:  As directed      Discharge instructions    Complete by:  As directed   Per general surgery Please Follow with your primary care regarding long term anticoagulation     Increase activity slowly    Complete by:  As directed             Medication List    STOP taking these medications        oxyCODONE-acetaminophen 5-325 MG tablet  Commonly known as:  PERCOCET/ROXICET     warfarin 5 MG tablet  Commonly known as:  COUMADIN      TAKE these medications        acetaminophen 500 MG tablet  Commonly known as:  TYLENOL  Take 1 tablet (500 mg total) by mouth every 8 (eight) hours as needed for mild pain or moderate pain. Reported on 05/20/2015     amLODipine 5 MG tablet  Commonly known as:  NORVASC  Take 1  tablet (5 mg total) by mouth daily.     amoxicillin-clavulanate 875-125 MG tablet  Commonly known as:  AUGMENTIN  Take 1 tablet by mouth every 12 (twelve) hours.     dicyclomine 20 MG tablet  Commonly known as:  BENTYL  Take 20 mg by mouth 4 (four) times daily -  before meals and at bedtime. Reported on 04/16/2015     docusate sodium 100 MG capsule  Commonly known as:  COLACE  Take 1 capsule (100 mg total) by mouth 2 (two) times daily.     enoxaparin 40 MG/0.4ML injection  Commonly known as:  LOVENOX  Inject 0.4 mLs (40 mg total) into the skin every 12 (twelve) hours.     feeding supplement Liqd  Take 1 Container by mouth 3 (three) times daily between meals.     fluocinonide cream 0.05 %  Commonly known as:  LIDEX  Apply 1 application topically 2 (two) times daily as needed. Reported on 05/05/2015  GAS-X EXTRA STRENGTH PO  Take 1 tablet by mouth 3 (three) times daily after meals.     HYDROmorphone 2 MG tablet  Commonly known as:  DILAUDID  Take 2 mg by mouth every 6 (six) hours as needed for severe pain. Reported on 05/20/2015     ketotifen 0.025 % ophthalmic solution  Commonly known as:  ZADITOR  Place 1 drop into both eyes 2 (two) times daily.     LORazepam 1 MG tablet  Commonly known as:  ATIVAN  Take 1 mg by mouth 2 (two) times daily. Reported on 05/20/2015     multivitamin with minerals Tabs tablet  Take 1 tablet by mouth daily.     mupirocin ointment 2 %  Commonly known as:  BACTROBAN  Place 1 application into the nose 2 (two) times daily.     ondansetron 4 MG tablet  Commonly known as:  ZOFRAN  Take 4 mg by mouth every 8 (eight) hours as needed for nausea or vomiting.     polyethylene glycol powder powder  Commonly known as:  GLYCOLAX/MIRALAX  MIX 17 G (1 CAPFUL) IN 8 OZ OF LIQUID 2 TIMES A DAY AS NEEDED     pregabalin 50 MG capsule  Commonly known as:  LYRICA  Take 50 mg by mouth 3 (three) times daily. 1 capsule twice daily, and 2 capsules at bedtime       PROBIOTIC DAILY PO  Take 1 capsule by mouth daily.     ranitidine 150 MG tablet  Commonly known as:  ZANTAC  TAKE 1 TABLET TWICE DAILY     traMADol 50 MG tablet  Commonly known as:  ULTRAM  Take 2 tablets (100 mg total) by mouth every 6 (six) hours as needed.     vitamin C 500 MG tablet  Commonly known as:  ASCORBIC ACID  Take 500 mg by mouth daily.     Vitamin D (Ergocalciferol) 50000 units Caps capsule  Commonly known as:  DRISDOL  Take 1 capsule (50,000 Units total) by mouth every 7 (seven) days.     zinc gluconate 50 MG tablet  Take 50 mg by mouth daily.        Diet and Activity recommendation: See Discharge Instructions above  Major procedures and Radiology Reports - PLEASE review detailed and final reports for all details, in brief -    Dg Chest Port 1 View  06/09/2015  CLINICAL DATA:  70 year old female with shortness of breath EXAM: PORTABLE CHEST 1 VIEW COMPARISON:  Radiograph dated 08/26/2014 FINDINGS: For single-view of the chest demonstrates emphysematous changes of the lungs. There is no focal consolidation, pleural effusion, or pneumothorax. There is persistent left costophrenic opacity, likely chronic and related to scarring. There is osteopenia. Cervical fixation plate and screws is partially visualized. The cardiac silhouette is within normal limits. No acute osseous pathology. IMPRESSION: No active disease. Electronically Signed   By: Anner Crete M.D.   On: 06/09/2015 19:26   Ct Cta Abd/pel W/cm &/or W/o Cm  06/07/2015  CLINICAL DATA:  Lower abdominal pain since yesterday morning, nausea, vomiting, emesis coffee brown in color, 20 pound weight loss over 2 years, diffuse abdominal tenderness, history peripheral arterial disease post abdominal aortic endovascular stent graft placement, COPD, former smoker EXAM: CTA ABDOMEN AND PELVIS wITHOUT AND WITH CONTRAST TECHNIQUE: Multidetector CT imaging of the abdomen and pelvis was performed using the standard  protocol during bolus administration of intravenous contrast. Multiplanar reconstructed images and MIPs were obtained and reviewed to  evaluate the vascular anatomy. CONTRAST:  125mL OMNIPAQUE IOHEXOL 350 MG/ML SOLN COMPARISON:  None. FINDINGS: Emphysematous changes at lung bases with LEFT basilar scarring. Small pericardial effusion. BILATERAL renal cysts. 15 x 10 mm nodule at posterior aspect upper pole LEFT kidney, concerning for renal neoplasm. Multiple BILATERAL nonobstructing renal calculi largest 10 mm diameter RIGHT kidney image 41. Liver, gallbladder, spleen, pancreas, kidneys, and adrenal glands otherwise unremarkable. Post antrectomy and gastrojejunostomy. Dilated small bowel up to 7.6 cm diameter. Mid small bowel obstruction identified secondary to an entero-enteric intussusception in the central pelvis ; wall thickening at the distal intussusceptum suspicious for mass. Distal small bowel loops and colon appear decompressed. Small amount of nonspecific free intraperitoneal fluid. Focal fluid attenuation collection in RIGHT pelvis 2.7 x 2.1 cm image 79 question ovarian cyst. Uterus surgically absent. Bones demineralized with degenerative disc disease changes at L4-L5. Extensive atherosclerotic calcifications. Prior endoluminal stenting of the abdominal aorta extending into common iliac arteries. Thrombus is seen within the stent at the distal aorta extending into the common iliac arteries bilaterally. Marked narrowing of RIGHT common iliac artery at its origin. Significant thrombus within LEFT common and external iliac arteries with lack of contrast opacification of the LEFT external iliac artery consistent with thrombosis. Patent LEFT axillofemoral bypass graft. LEFT internal iliac artery appears occluded though with distal filling of branches by collaterals. Significant narrowing of the RIGHT common femoral artery with suspected occlusion of the proximal RIGHT SFA. Occluded LEFT common femoral artery. No  evidence of periaortic hemorrhage. Review of the MIP images confirms the above findings. IMPRESSION: Enteroenteric intussusception at the mid small bowel with secondary small bowel obstruction. Suspected lead point/mass within small bowel as eitiology. No evidence of bowel perforation. Extensive atherosclerotic disease post aortoiliac stent graft placement and LEFT axillofemoral bypass grafting. BILATERAL nonobstructing renal calculi and renal cysts. 1.5 x 1.0 cm diameter potential in neoplasm at upper pole LEFT kidney. Small pericardial effusion. Findings called to Dr. Kathrynn Humble on 06/07/2015 at 1119 hours. Electronically Signed   By: Lavonia Dana M.D.   On: 06/07/2015 11:22    Micro Results   Recent Results (from the past 240 hour(s))  MRSA PCR Screening     Status: None   Collection Time: 06/07/15  9:00 PM  Result Value Ref Range Status   MRSA by PCR NEGATIVE NEGATIVE Final    Comment:        The GeneXpert MRSA Assay (FDA approved for NASAL specimens only), is one component of a comprehensive MRSA colonization surveillance program. It is not intended to diagnose MRSA infection nor to guide or monitor treatment for MRSA infections.   Culture, Urine     Status: None   Collection Time: 06/08/15  4:55 AM  Result Value Ref Range Status   Specimen Description URINE, CATHETERIZED  Final   Special Requests Normal  Final   Culture   Final    NO GROWTH 1 DAY Performed at [facility]    Report Status 06/09/2015 FINAL  Final       Today   Subjective:   Cynthia Bowen denies any complaints..   Objective:   Blood pressure 118/56, pulse 75, temperature 98.1 F (36.7 C), temperature source Oral, resp. rate 16, height 5\' 5"  (1.651 m), weight 40.1 kg (88 lb 6.5 oz), SpO2 99 %.   Intake/Output Summary (Last 24 hours) at 06/14/15 1701 Last data filed at 06/14/15 0502  Gross per 24 hour  Intake      0 ml  Output  850 ml  Net   -850 ml    Exam   Data Review   CBC w  Diff:  Lab Results  Component Value Date   WBC 5.2 06/14/2015   WBC 15.2* 06/06/2015   WBC 8.2 08/26/2012   HGB 12.8 06/14/2015   HGB 14.5 06/06/2015   HGB 10.6* 08/26/2012   HCT 38.1 06/14/2015   HCT 43.3 06/06/2015   HCT 31.2* 08/26/2012   PLT 384 06/14/2015   PLT 516* 08/26/2012   LYMPHOPCT 15 06/10/2015   LYMPHOPCT 16.8 08/26/2012   MONOPCT 7 06/10/2015   MONOPCT 3.9 08/26/2012   EOSPCT 1 06/10/2015   EOSPCT 1.5 08/26/2012   BASOPCT 0 06/10/2015   BASOPCT 0.5 08/26/2012    CMP:  Lab Results  Component Value Date   NA 138 06/14/2015   NA 138 08/26/2012   K 4.1 06/14/2015   K 4.0 08/26/2012   CL 102 06/14/2015   CL 108* 08/26/2012   CO2 26 06/14/2015   CO2 24 08/26/2012   BUN 5* 06/14/2015   BUN 29* 08/26/2012   BUN 4 08/19/2009   CREATININE 0.77 06/14/2015   CREATININE 0.66 06/06/2015   CREATININE 0.79 08/26/2012   GLU 101 09/27/2008   PROT 6.5 06/11/2015   PROT 9.1* 08/26/2012   ALBUMIN 3.0* 06/11/2015   ALBUMIN 3.2* 08/26/2012   BILITOT 0.8 06/11/2015   BILITOT 0.2 08/26/2012   ALKPHOS 61 06/11/2015   ALKPHOS 127 08/26/2012   AST 28 06/11/2015   AST 20 08/26/2012   ALT 20 06/11/2015   ALT 17 08/26/2012  .   Total Time in preparing paper work, data evaluation and todays exam - 25 minutes  Geralene Afshar M.D on 06/14/2015 at 5:01 PM  [facility] Office  [phone]

## 2015-06-14 NOTE — Progress Notes (Signed)
Physical Therapy Treatment and Discharge from Acute PT Patient Details Name: Cynthia Bowen MRN: [id number] DOB: [birth year] Today's Date: 07/02/2015    History of Present Illness 70 y.o. female with a past medical history of peripheral vascular disease, right lower extremity chronic wound, history of pulmonary embolism on warfarin, previous history of gastric perforation requiring surgery and omental patch, who presented to the hospital with complaints of nausea, vomiting and abdominal pain and s/p small bowel resection with anastomosis, lysis of adhesions on 06/07/15    PT Comments    Pt ambulated in hallway and mobilizing well.  Pt appears in better spirit today and anticipates d/c home soon.  Recommended pt continue ambulating in hallway with staff.  Pt has met acute PT goals and will d/c from PT at this time.    Follow Up Recommendations  Supervision - Intermittent     Equipment Recommendations  None recommended by PT    Recommendations for Other Services       Precautions / Restrictions Precautions Precautions: Fall Precaution Comments: Nonhealing ulcer to right dorsal foot with exposed tendon    Mobility  Bed Mobility Overal bed mobility: Modified Independent                Transfers Overall transfer level: Modified independent                  Ambulation/Gait Ambulation/Gait assistance: Supervision Ambulation Distance (Feet): 400 Feet Assistive device: Rolling walker (2 wheeled) Gait Pattern/deviations: Step-through pattern;Trunk flexed     General Gait Details: verbal cues for safe use of RW (used to using rollator), HR elevated to 150 during gait (RN aware)   Stairs            Wheelchair Mobility    Modified Rankin (Stroke Patients Only)       Balance                                    Cognition Arousal/Alertness: Awake/alert Behavior During Therapy: WFL for tasks assessed/performed Overall Cognitive Status:  Within Functional Limits for tasks assessed                      Exercises      General Comments        Pertinent Vitals/Pain Pain Assessment: No/denies pain    Home Living                      Prior Function            PT Goals (current goals can now be found in the care plan section) Progress towards PT goals: Goals met/education completed, patient discharged from PT    Frequency       PT Plan  (d/c from acute PT)    Co-evaluation             End of Session   Activity Tolerance: Patient tolerated treatment well Patient left: with call bell/phone within reach;in chair     Time: 0948-1000 PT Time Calculation (min) (ACUTE ONLY): 12 min  Charges:  $Gait Training: 8-22 mins                    G Codes:      Keithan Dileonardo,KATHrine E 2015-07-02, 12:53 PM Carmelia Bake, PT, DPT 07/02/2015 Pager: [phone]

## 2015-06-15 ENCOUNTER — Telehealth: Payer: Self-pay | Admitting: Internal Medicine

## 2015-06-15 NOTE — Telephone Encounter (Signed)
RN did not call patient.

## 2015-06-15 NOTE — Telephone Encounter (Signed)
Cynthia Bowen did not call patient either

## 2015-06-15 NOTE — Telephone Encounter (Signed)
Pt says she was returning a call,did not know who called from Dr [facility] office and did not know when the call was made.

## 2015-06-15 NOTE — Telephone Encounter (Signed)
Returned call to patient she stated she was returning some ones call.Advised I will send message to Dr.Hilty's nurse and to our pharmacist Erasmo Downer.

## 2015-06-17 ENCOUNTER — Ambulatory Visit (INDEPENDENT_AMBULATORY_CARE_PROVIDER_SITE_OTHER)
Admission: RE | Admit: 2015-06-17 | Discharge: 2015-06-17 | Disposition: A | Discharge status: Home / Self Care | Payer: Medicare Other | Source: Ambulatory Visit | Attending: Vascular Surgery | Admitting: Vascular Surgery

## 2015-06-17 ENCOUNTER — Ambulatory Visit (HOSPITAL_COMMUNITY)
Admission: RE | Admit: 2015-06-17 | Discharge: 2015-06-17 | Disposition: A | Discharge status: Home / Self Care | Payer: Medicare Other | Source: Ambulatory Visit | Attending: Vascular Surgery | Admitting: Vascular Surgery

## 2015-06-17 ENCOUNTER — Encounter: Payer: Self-pay | Admitting: Vascular Surgery

## 2015-06-17 ENCOUNTER — Ambulatory Visit (INDEPENDENT_AMBULATORY_CARE_PROVIDER_SITE_OTHER): Payer: Medicare Other | Admitting: Vascular Surgery

## 2015-06-17 VITALS — BP 125/71 | HR 81 | Temp 97.0°F | Resp 16 | Ht 64.0 in | Wt 83.0 lb

## 2015-06-17 DIAGNOSIS — R0989 Other specified symptoms and signs involving the circulatory and respiratory systems: Secondary | ICD-10-CM | POA: Diagnosis present

## 2015-06-17 DIAGNOSIS — I70213 Atherosclerosis of native arteries of extremities with intermittent claudication, bilateral legs: Secondary | ICD-10-CM

## 2015-06-17 DIAGNOSIS — I7025 Atherosclerosis of native arteries of other extremities with ulceration: Secondary | ICD-10-CM | POA: Diagnosis not present

## 2015-06-17 NOTE — Progress Notes (Signed)
Established Critical Limb Ischemia Patient  History of Present Illness  Cynthia Bowen is a 70 y.o. (25-Dec-1945) female who presents with chief complaint: abdominal pain s/p recent SBR for intussception.  This patient is well known to me from initial BLE intermittent claudication.  Her angiogram demonstrate severe aortic stenosis that was likely to result in bilateral leg ischemia.  She has undergone:  1.  08/26/14 Aortic stenting with endograft, PTA+S L CIA 2.  09/05/14 R EIA and CFA EA w/ Dacron patch angioplasty, R to L fem-fem BPG 3.  12/30/14  I&D B groin abscesses 4.  12/31/14 Exc infected fem-fem BPG, B CFA BPA, L ax-PFA BPG 5.  01/11/15 Drainage L axilla seroma  The patient develop an ulcer on the anterior right shin that has been degenerating.  She was scheduled for surgical debridement with Plastic surgery but has had to be canceled twice: once due to elevated INR and once due to the recent intussception.  The patient currently has limited ambulation so she does not have intermittent claudication.  She also does not have rest pain at this point.  The patient's PMH, PSH, SH, and FamHx are unchanged from 05/13/15.  Current Outpatient Prescriptions  Medication Sig Dispense Refill  . acetaminophen (TYLENOL) 500 MG tablet Take 1 tablet (500 mg total) by mouth every 8 (eight) hours as needed for mild pain or moderate pain. Reported on 05/20/2015 30 tablet 0  . amLODipine (NORVASC) 5 MG tablet Take 1 tablet (5 mg total) by mouth daily. 30 tablet 0  . amoxicillin-clavulanate (AUGMENTIN) 875-125 MG tablet Take 1 tablet by mouth every 12 (twelve) hours. 4 tablet 0  . dicyclomine (BENTYL) 20 MG tablet Take 20 mg by mouth 4 (four) times daily -  before meals and at bedtime. Reported on 04/16/2015    . docusate sodium (COLACE) 100 MG capsule Take 1 capsule (100 mg total) by mouth 2 (two) times daily. 10 capsule 0  . feeding supplement, RESOURCE BREEZE, (RESOURCE BREEZE) LIQD Take 1 Container by mouth  3 (three) times daily between meals.    . fluocinonide cream (LIDEX) [id number] % Apply 1 application topically 2 (two) times daily as needed. Reported on 05/05/2015    . ketotifen (ZADITOR) 0.025 % ophthalmic solution Place 1 drop into both eyes 2 (two) times daily.    Marland Kitchen LORazepam (ATIVAN) 1 MG tablet Take 1 mg by mouth 2 (two) times daily. Reported on 05/20/2015    . Multiple Vitamin (MULTIVITAMIN WITH MINERALS) TABS tablet Take 1 tablet by mouth daily.    . mupirocin ointment (BACTROBAN) 2 % Place 1 application into the nose 2 (two) times daily. 30 g 3  . ondansetron (ZOFRAN) 4 MG tablet Take 4 mg by mouth every 8 (eight) hours as needed for nausea or vomiting.    . polyethylene glycol powder (GLYCOLAX/MIRALAX) powder MIX 17 G (1 CAPFUL) IN 8 OZ OF LIQUID 2 TIMES A DAY AS NEEDED 527 g 1  . pregabalin (LYRICA) 50 MG capsule Take 50 mg by mouth 3 (three) times daily. 1 capsule twice daily, and 2 capsules at bedtime    . Probiotic Product (PROBIOTIC DAILY PO) Take 1 capsule by mouth daily.    . ranitidine (ZANTAC) 150 MG tablet TAKE 1 TABLET TWICE DAILY 180 tablet 0  . Simethicone (GAS-X EXTRA STRENGTH PO) Take 1 tablet by mouth 3 (three) times daily after meals.     . traMADol (ULTRAM) 50 MG tablet Take 2 tablets (100 mg total) by  mouth every 6 (six) hours as needed. 30 tablet 0  . vitamin C (ASCORBIC ACID) 500 MG tablet Take 500 mg by mouth daily.    . Vitamin D, Ergocalciferol, (DRISDOL) 50000 UNITS CAPS capsule Take 1 capsule (50,000 Units total) by mouth every 7 (seven) days. (Patient taking differently: Take 50,000 Units by mouth every 7 (seven) days. On Saturday) 12 capsule 3  . zinc gluconate 50 MG tablet Take 50 mg by mouth daily.    Marland Kitchen enoxaparin (LOVENOX) 40 MG/0.4ML injection Inject 0.4 mLs (40 mg total) into the skin every 12 (twelve) hours. (Patient not taking: Reported on 06/17/2015) 20 Syringe 0  . HYDROmorphone (DILAUDID) 2 MG tablet Take 2 mg by mouth every 6 (six) hours as needed for severe  pain. Reported on 06/17/2015     No current facility-administered medications for this visit.    Allergies  Allergen Reactions  . Avelox [Moxifloxacin Hcl In Nacl] Nausea And Vomiting  . Betadine [Povidone Iodine] Itching and Rash  . Alendronate Sodium Nausea And Vomiting and Other (See Comments)    dizziness  . Aspirin Nausea Only  . Codeine Nausea And Vomiting  . Doxycycline Nausea And Vomiting  . Fluconazole Nausea And Vomiting  . Hydrocodone Nausea And Vomiting    GI distress  . Hydrocodone-Acetaminophen Nausea And Vomiting  . Morphine And Related Nausea Only  . Neurontin [Gabapentin] Other (See Comments)    Mood changes   . Nsaids Other (See Comments)    Severe gastritis & perforation - avoid NSAIDs when possible  . Quinolones Hives and Itching  . Sertraline Hcl Nausea And Vomiting and Other (See Comments)    Hallucinations   . Sulfamethoxazole Hives and Itching  . Latex Rash  . Sulfa Antibiotics Rash    On ROS today: recent surgery on bowels, continued abd pain   Physical Examination  Filed Vitals:   06/17/15 1225  BP: 125/71  Pulse: 81  Temp: 97 F (36.1 C)  Resp: 16  Height: 5\' 4"  (1.626 m)  Weight: 83 lb (37.649 kg)   Body mass index is 14.24 kg/(m^2).  General: A&O x 3, WD, Cachectic, Ill appear   Eyes: PERRLA, EOMI  Pulmonary: Sym exp, good air movt, CTAB, no rales, rhonchi, & wheezing  Cardiac: RRR, Nl S1, S2, no Murmurs, rubs or gallops  Vascular: Vessel Right Left  Radial Palpable Palpable  Brachial Palpable Palpable  Carotid Palpable, without bruit Palpable, without bruit  Aorta Not palpable N/A  Femoral Palpable Not Palpable  Popliteal Not palpable Not palpable  PT Not Palpable Not Palpable  DP Not Palpable Not Palpable   Gastrointestinal: soft, appropriately TTP, staples in the mid-line, -G/R  Musculoskeletal: M/S 5/5 throughout , Exposed tendon in R shin ulcer with some granulation  Neurologic: Pain and light touch intact in  extremities , Motor exam as listed above  Non-Invasive Vascular Imaging ABI (Date: 06/17/2015)  R: 0.45 (0.62), DP: mono, PT: mono  L: 0.63 (0.56), DP: mono, PT: mono  BLE arterial duplex (06/17/2015)  R: mono CFA and PFA, occluded SFA, DM tibial  L: patent axillo-PFA graft, with intact collateral flow to mono pop, mono AT, DM PT  Medical Decision Making  Cynthia Bowen is a 70 y.o. female who presents with: RLE critical limb ischemia with anterior shin ulcer   I don't have much to offer this patient at this point.  Her right groin has been explored twice already, so a redo-redo-redo exposure is likely to be very hostile.  She also recently required an additional bowel process in the setting of multiple prior procedures.  Unfortunately, she is on a downward spiral, so I suspect further aggressive interventions will likely hasten that.  Pt will follow up with Dr. Marla Roe soon in early March.  If she can debride the patient and get her to heal with an Apligraft, this would be best.  I'm not certain if the patient would even consider further surgery at this point.  I discussed in depth with the patient the nature of atherosclerosis, and emphasized the importance of maximal medical management including strict control of blood pressure, blood glucose, and lipid levels, antiplatelet agents, obtaining regular exercise, and cessation of smoking.    The patient is aware that without maximal medical management the underlying atherosclerotic disease process will progress, limiting the benefit of any interventions. The patient is currently not on a statin: as not indicated.   The patient is currently not on an anti-platelet.  The patient needs to be on ASA 81 mg PO daily minimally  Thank you for allowing [place] to participate in this patient's care.   Adele Barthel, MD [facility] Office: [phone] Pager: [phone]  06/17/2015, 5:45 PM

## 2015-06-20 ENCOUNTER — Telehealth: Payer: Self-pay | Admitting: Family Medicine

## 2015-06-20 ENCOUNTER — Ambulatory Visit (INDEPENDENT_AMBULATORY_CARE_PROVIDER_SITE_OTHER): Payer: Medicare Other | Admitting: Physician Assistant

## 2015-06-20 VITALS — BP 102/64 | HR 82 | Temp 98.5°F | Resp 16 | Ht 64.0 in | Wt 84.2 lb

## 2015-06-20 DIAGNOSIS — I70213 Atherosclerosis of native arteries of extremities with intermittent claudication, bilateral legs: Secondary | ICD-10-CM

## 2015-06-20 DIAGNOSIS — Z7901 Long term (current) use of anticoagulants: Secondary | ICD-10-CM | POA: Diagnosis not present

## 2015-06-20 DIAGNOSIS — L98499 Non-pressure chronic ulcer of skin of other sites with unspecified severity: Secondary | ICD-10-CM

## 2015-06-20 DIAGNOSIS — Z5181 Encounter for therapeutic drug level monitoring: Secondary | ICD-10-CM

## 2015-06-20 LAB — POCT CBC
Granulocyte percent: 71.5 %G (ref 37–80)
HCT, POC: 34.4 % — AB (ref 37.7–47.9)
HEMOGLOBIN: 11.7 g/dL — AB (ref 12.2–16.2)
LYMPH, POC: 2.2 (ref 0.6–3.4)
MCH, POC: 29.5 pg (ref 27–31.2)
MCHC: 34.2 g/dL (ref 31.8–35.4)
MCV: 86.4 fL (ref 80–97)
MID (CBC): 0.4 (ref 0–0.9)
MPV: 7.3 fL (ref 0–99.8)
POC Granulocyte: 6.3 (ref 2–6.9)
POC LYMPH %: 24.5 % (ref 10–50)
POC MID %: 4 % (ref 0–12)
Platelet Count, POC: 311 10*3/uL (ref 142–424)
RBC: 3.98 M/uL — AB (ref 4.04–5.48)
RDW, POC: 20.4 %
WBC: 8.8 10*3/uL (ref 4.6–10.2)

## 2015-06-20 LAB — PROTIME-INR
INR: 1.73 — ABNORMAL HIGH (ref <1.50)
Prothrombin Time: 20.5 seconds — ABNORMAL HIGH (ref 11.6–15.2)

## 2015-06-20 MED ORDER — TRAMADOL HCL 50 MG PO TABS: 50.0000 mg | ORAL_TABLET | Freq: Three times a day (TID) | ORAL | Status: DC | PRN

## 2015-06-20 NOTE — Telephone Encounter (Signed)
Willis Modena, nurse with [facility], called regarding orders to tx. Need to know if continue same tx, how to tx, and wound care orders. She was d/c from [facility] 06/14/15 dx: SBO, renal mass.

## 2015-06-20 NOTE — Telephone Encounter (Signed)
Her anticoagulation has been managed by the vascular surgeons with their Coumadin clinic///she would need follow-up today with [place] for her other issues but we can arrange referral back to the vascular surgeons for this I believe

## 2015-06-20 NOTE — Progress Notes (Signed)
06/20/2015 3:18 PM   DOB: 08/05/1945 / MRN: [id number]  SUBJECTIVE:  Cynthia Bowen is a 70 y.o. female presenting for for a PT/INR check and an old wound on the right anterior ankle.  This is an old wound and she has a follow up with vascular surgery next week for repair and they have also been managing the wound.  She denies tenderness of the area today, and states "it has looked about the same since November."    She would like her PT/INR checked.  She is on Coumadin prescribed by Dr. Debara Pickett in cardiology.    She is allergic to avelox; betadine; alendronate sodium; aspirin; codeine; doxycycline; fluconazole; hydrocodone; hydrocodone-acetaminophen; morphine and related; neurontin; nsaids; quinolones; sertraline hcl; sulfamethoxazole; latex; and sulfa antibiotics.   She  has a past medical history of COPD (chronic obstructive pulmonary disease) ([place]); Pneumonia (12-2011); GERD (gastroesophageal reflux disease); Headache(784.0); Arthritis; Allergy; Depression; Neuromuscular disorder ([place]); Osteoporosis; Bronchitis; Fibromyalgia; Pain; Pain; Gastrocutaneous fistula; Anemia; Anxiety; Blood transfusion without reported diagnosis; Heart murmur; Peripheral vascular disease ([place]); History of kidney stones; and Pulmonary embolism ([place]).    She  reports that she quit smoking about 10 years ago. Her smoking use included Cigarettes. She started smoking about 12 years ago. She has a 17.5 pack-year smoking history. She has never used smokeless tobacco. She reports that she does not drink alcohol or use illicit drugs. She  reports that she does not engage in sexual activity. The patient  has past surgical history that includes Abdominal hysterectomy; Esophagogastroduodenoscopy (04/18/2012); EUS (05/29/2012); Appendectomy; Spine surgery; Eye surgery; Laparoscopic abdominal exploration (N/A, 07/02/2012); laparotomy (N/A, 07/07/2012); Stomach surgery (07/07/2012); laparotomy (N/A, 07/13/2012); laparotomy (N/A, 11/11/2012);  Bowel resection (N/A, 11/11/2012); Minor application of wound vac (N/A, 11/11/2012); Jejunostomy (N/A, 11/11/2012); Lysis of adhesion (N/A, 11/11/2012); Hernia repair; gastric fistula repair; Colonoscopy; Polypectomy; Upper gastrointestinal endoscopy; abdominal aortagram (N/A, 08/19/2014); Abdominal aortic endovascular stent graft (N/A, 08/26/2014); Femoral-femoral Bypass Graft (Bilateral, 09/04/2014); Wound exploration (Left, 12/30/2014); Irrigation and debridement abscess (Bilateral, 12/30/2014); Application if wound vac (Right, 12/30/2014); Femoral-femoral Bypass Graft (Bilateral, 12/31/2014); Axillary-femoral Bypass Graft (Left, 12/31/2014); Patch angioplasty (Right, 12/31/2014); Intraoperative arteriogram (Left, 12/31/2014); Wound exploration (Left, 01/11/2015); Application if wound vac (Left, 01/11/2015); Ovarian cyst removal; and laparotomy (N/A, 06/07/2015).  Her family history includes COPD in her mother; Hyperlipidemia in her mother; Hypertension in her maternal grandmother; Stroke in her maternal grandmother. There is no history of Colon cancer.  Review of Systems  Constitutional: Negative for fever and chills.  Eyes: Negative for blurred vision.  Respiratory: Negative for cough and shortness of breath.   Cardiovascular: Negative for chest pain.  Gastrointestinal: Negative for nausea and abdominal pain.  Genitourinary: Negative for dysuria, urgency and frequency.  Musculoskeletal: Negative for myalgias.  Skin: Negative for rash.  Neurological: Negative for dizziness, tingling and headaches.  Psychiatric/Behavioral: Negative for depression. The patient is not nervous/anxious.     Problem list and medications reviewed and updated by myself where necessary, and exist elsewhere in the encounter.   OBJECTIVE:      BP 102/64 mmHg  Pulse 82  Temp(Src) 98.5 F (36.9 C) (Oral)  Resp 16  Ht 5\' 4"  (1.626 m)  Wt 84 lb 3.2 oz (38.193 kg)  BMI 14.45 kg/m2  SpO2 98%  Physical Exam  Constitutional: She is  oriented to person, place, and time.  HENT:  Right Ear: External ear normal.  Left Ear: External ear normal.  Nose: Mucosal edema present. Right sinus exhibits no maxillary sinus tenderness and no  frontal sinus tenderness. Left sinus exhibits no maxillary sinus tenderness and no frontal sinus tenderness.  Mouth/Throat: Oropharynx is clear and moist. No oropharyngeal exudate.  Eyes: Conjunctivae are normal. Pupils are equal, round, and reactive to light.  Cardiovascular: Regular rhythm and normal heart sounds.   Pulmonary/Chest: Effort normal and breath sounds normal. She has no rales.  Neurological: She is alert and oriented to person, place, and time.  Skin: Skin is warm and dry. No rash noted. She is not diaphoretic. No erythema.  Psychiatric: Her behavior is normal.   Lab Results  Component Value Date   INR 0.94 06/14/2015   INR 1.05 06/09/2015   INR 0.98 06/07/2015     No results found for this or any previous visit (from the past 72 hour(s)).  No results found.  ASSESSMENT AND PLAN  Cynthia Bowen was seen today for follow-up, right ankle wound, abdominal wound and check pt/inr.  Diagnoses and all orders for this visit:  Ulcer, skin, chronic, with unspecified severity [facility]): She is followed closely by vascular surgery.  Advised she return to her wound care specialist or here if the wound worsens.   -     Cancel: CBC -     POCT CBC  Anticoagulated on Coumadin: -     Protime-INR    The patient was advised to call or return to clinic if she does not see an improvement in symptoms or to seek the care of the closest emergency department if she worsens with the above plan.   Philis Fendt, MHS, PA-C [facility] 06/20/2015 3:18 PM

## 2015-06-20 NOTE — Telephone Encounter (Signed)
error 

## 2015-06-20 NOTE — Telephone Encounter (Signed)
Wound care for R ankle and post-op abdominal wound from bowel resection with reanastomosis. Also on coumadin. While she was in hospital she was on coumadin and lovenox. She has both at home but only taking Coumadin 5mg  1 tab po qd except for Mon and Fri takes 1/2 tab. They will also need continuous orders . Right now they have home health aid 2 x week x 9 weeks. She will need PT and OT.   After reading d/c summary she needs to RTC today for hospital f/u, check PT/INR, clarify medications, assess wound or wounds and determine tx orders. Pam and patient notified and voiced understanding. Patient states she will come in today.  D/C summary states transfer patient back to coumadin when appropriate. Coumadin had been on hold for pending ankle surgery, however she was admitted with intussusception, and now post bowel resection with reanastomosis. Will continue Lovenox until ankle surgery.   Ms. Bohac told Pam she was only taking Coumadin and once she left hospital stop lovenox.

## 2015-06-21 ENCOUNTER — Telehealth: Payer: Self-pay | Admitting: Family Medicine

## 2015-06-21 NOTE — Telephone Encounter (Signed)
Spoke with patient to confirm dosage she has been taking of Coumadin. She takes Coumadin 5mg  1 tab(5mg ) Tues-Thurs, Fri 1/2 tab(2.5mg ), Sat & Sun 1 tab(5mg ), Mon 1/2 tab(2.5mg )

## 2015-06-22 ENCOUNTER — Encounter: Payer: Medicare Other | Admitting: Internal Medicine

## 2015-06-22 NOTE — Telephone Encounter (Signed)
Hi [name],  Could you please call and advise we need to recheck INR in 5 days from today? Please place orders only encounter and I will cosign. Thank you.   Cynthia Bowen

## 2015-06-22 NOTE — Telephone Encounter (Signed)
Legrand Como she is here seeing Dr. Laney Pastor today.

## 2015-06-23 ENCOUNTER — Telehealth: Payer: Self-pay

## 2015-06-23 NOTE — Telephone Encounter (Signed)
Patient dropped off a form for Dr. Laney Pastor to fill out. The form has been placed in the [place] station box.

## 2015-06-23 NOTE — Progress Notes (Signed)
Chose not to wait to be seen Feels she is doing well enough after hospital that there is nothing to be done that is new Need sf/u re wound as noted last OV This encounter was created in error - please disregard.

## 2015-06-24 NOTE — Telephone Encounter (Signed)
Please advise 

## 2015-06-27 ENCOUNTER — Telehealth: Payer: Self-pay | Admitting: Internal Medicine

## 2015-06-27 NOTE — Telephone Encounter (Signed)
New message       Calling to see when pt is due to have another PT/INR check

## 2015-06-27 NOTE — Telephone Encounter (Signed)
LMOM for next INR draw to be this week

## 2015-06-28 ENCOUNTER — Ambulatory Visit (INDEPENDENT_AMBULATORY_CARE_PROVIDER_SITE_OTHER): Payer: Medicare Other | Admitting: Pharmacist Clinician (PhC)/ Clinical Pharmacy Specialist

## 2015-06-28 DIAGNOSIS — Z7901 Long term (current) use of anticoagulants: Secondary | ICD-10-CM

## 2015-06-28 DIAGNOSIS — I739 Peripheral vascular disease, unspecified: Secondary | ICD-10-CM

## 2015-06-28 LAB — POCT INR: INR: 2.5

## 2015-06-28 NOTE — Telephone Encounter (Signed)
I don't see a form in the Nurse's box or in Dr. Ninfa Meeker box.  Anyone seen this?

## 2015-07-05 ENCOUNTER — Ambulatory Visit (INDEPENDENT_AMBULATORY_CARE_PROVIDER_SITE_OTHER): Payer: Medicare Other | Admitting: Pharmacist Clinician (PhC)/ Clinical Pharmacy Specialist

## 2015-07-05 DIAGNOSIS — I739 Peripheral vascular disease, unspecified: Secondary | ICD-10-CM

## 2015-07-05 DIAGNOSIS — Z7901 Long term (current) use of anticoagulants: Secondary | ICD-10-CM

## 2015-07-05 LAB — POCT INR: INR: 2.7

## 2015-07-11 ENCOUNTER — Encounter: Payer: Self-pay | Admitting: Vascular Surgery

## 2015-07-12 ENCOUNTER — Ambulatory Visit (INDEPENDENT_AMBULATORY_CARE_PROVIDER_SITE_OTHER): Payer: Medicare Other | Admitting: Family Medicine

## 2015-07-12 VITALS — BP 120/60 | HR 82 | Temp 98.3°F | Resp 16 | Ht 65.0 in | Wt 83.6 lb

## 2015-07-12 DIAGNOSIS — N3091 Cystitis, unspecified with hematuria: Secondary | ICD-10-CM

## 2015-07-12 DIAGNOSIS — R300 Dysuria: Secondary | ICD-10-CM

## 2015-07-12 DIAGNOSIS — R21 Rash and other nonspecific skin eruption: Secondary | ICD-10-CM

## 2015-07-12 DIAGNOSIS — R319 Hematuria, unspecified: Secondary | ICD-10-CM

## 2015-07-12 DIAGNOSIS — Z7901 Long term (current) use of anticoagulants: Secondary | ICD-10-CM

## 2015-07-12 DIAGNOSIS — I70213 Atherosclerosis of native arteries of extremities with intermittent claudication, bilateral legs: Secondary | ICD-10-CM | POA: Diagnosis not present

## 2015-07-12 LAB — POCT URINALYSIS DIP (MANUAL ENTRY)
BILIRUBIN UA: NEGATIVE
GLUCOSE UA: NEGATIVE
Nitrite, UA: NEGATIVE
Protein Ur, POC: 30 — AB
SPEC GRAV UA: 1.015
Urobilinogen, UA: 0.2
pH, UA: 6.5

## 2015-07-12 LAB — POC MICROSCOPIC URINALYSIS (UMFC): MUCUS RE: ABSENT

## 2015-07-12 MED ORDER — CEPHALEXIN 500 MG PO CAPS: 500.0000 mg | ORAL_CAPSULE | Freq: Two times a day (BID) | ORAL | Status: DC

## 2015-07-12 NOTE — Progress Notes (Addendum)
Subjective:  By signing my name below, I, Cynthia Bowen, attest that this documentation has been prepared under the direction and in the presence of Cynthia Ray, MD. Electronically Signed: Moises Bowen, [place]. 07/12/2015 , 3:28 PM .  Patient was seen in Room 5 .   Patient ID: Cynthia Bowen, female    DOB: 1946/02/17, 70 y.o.   MRN: [id number] Chief Complaint  Patient presents with  . Hematuria    painful   . Rash   HPI SELEN FESS is a 70 y.o. female Here for 2 complaints today, hematuria and rash.  PCP is [name], [name], MD  Hematuria Pt takes coumadin. H/o peripheral artery disease.  Her last INR was in March 14, which 2.7.  Her previous INR was 2.5 1 week prior.   She's been having hematuria with burning and frequency first noticed 2 days ago. She has not tried any medications to alleviate this.   Rash She noticed this rash over her left arm a few days prior to her urinary symptoms. Her rash was initially itching but not anymore. Her rash hasn't gone away but not worsened. She denies any new creams or lotions. She denies going outside or come into known contact with poison ivy. She denies Bowen in stool.   She has past abdomen surgery due to blocked intestines.   Patient Active Problem List   Diagnosis Date Noted  . Atherosclerosis of native arteries of the extremities with ulceration ([place]) 06/17/2015  . Essential hypertension 06/12/2015  . COPD exacerbation ([place]) 06/09/2015  . Leukocytosis 06/08/2015  . SBO (small bowel obstruction) ([place]) 06/07/2015  . Intussusception intestine ([place]) 06/07/2015  . Long term (current) use of anticoagulants [Z79.01] 05/03/2015  . Enteritis due to Clostridium difficile 02/16/2015  . Ankle fracture, right 02/03/2015  . Abnormal finding on mammography 12/31/2014  . Groin abscess 12/30/2014  . Groin pain 12/29/2014  . Chronic pain associated with significant psychosocial dysfunction 12/15/2014  . Wound drainage-Right groin  11/15/2014  . Ischemia of lower extremity 09/05/2014  . Abdominal aortic stenosis 09/03/2014  . PAD (peripheral artery disease) ([place]) 08/26/2014  . Discoloration of skin- Left Heel 08/13/2014  . Left foot pain- Heel 08/13/2014  . Leg heaviness-Bilateral 08/13/2014  . Protein-calorie malnutrition, severe ([place]) 06/04/2014  . Cellulitis of hand 06/02/2014  . Hypokalemia 06/02/2014  . Osteoporosis 05/22/2014  . Recurrent abdominal pain--multiple causes, status post GI surgery with multiple complications [id number]  . Abnormal CT scan 02/08/2014  . Atherosclerosis of native arteries of extremity with intermittent claudication ([place]) 01/15/2014  . Nephrolithiasis-asymptomatic/noted on scan 10/08/2013  . Other and unspecified ovarian cyst--noted on scans 2014 2015/asymptomatic 10/08/2013  . Pancreatic lesion-6 mm noted on MRI at [facility] 10/08/2013  . Femoral artery stenosis-bilateral noted CT at [facility] 2015 10/08/2013  . History of pulmonary embolism 08/05/2012  . Incarcerated epigastric hernia s/p primary repair 07/02/2012 07/11/2012  . 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 [id number] 07/10/2012  . Pericardial effusion 04/17/2012  . COPD GOLD I 01/29/2012  . Back pain DDD S/p surgery [place] 1993 12/29/2011  . Depression with anxiety 04/02/2011  . GERD (gastroesophageal reflux disease) 04/02/2011  . Osteoarthritis 04/02/2011  . Cervical neck pain with evidence of disc disease-s/p surgery [id number] 04/02/2011  . Fibromyalgia-[place] 2004 04/02/2011   Past Medical History  Diagnosis Date  . COPD (chronic obstructive pulmonary disease) ([place])   . Pneumonia 12-2011  . GERD (gastroesophageal reflux disease)   . Headache(784.0)   .  Arthritis     osteoarthritis  . Allergy   . Depression   . Neuromuscular disorder ([place])   . Osteoporosis   . Bronchitis     CURRENTLY AS OF 06/30/12 - HAS COUGH AND FINISHED ANTIBIOTIC FOR BRONCHITIS    . Fibromyalgia   . Pain     ABDOMINAL PAIN AND NAUSEA  . Pain     SOMETIMES PAIN RIGHT EAR AND NECK--STATES CAUSED BY A "LUMP" ON BACK OF EAR--USES KENALOG CREAM TOPICALLY AS NEEDED.  Marland Kitchen Gastrocutaneous fistula   . Anemia   . Anxiety   . Bowen transfusion without reported diagnosis   . Heart murmur     young  . Peripheral vascular disease (HCC)     hx  ?leg  . History of kidney stones   . Pulmonary embolism [facility])    Past Surgical History  Procedure Laterality Date  . Abdominal hysterectomy    . Esophagogastroduodenoscopy  04/18/2012    Procedure: ESOPHAGOGASTRODUODENOSCOPY (EGD);  Surgeon: Inda Castle, MD;  Location[facility];  Service: Endoscopy;  Laterality: N/A;  . Eus  05/29/2012    Procedure: UPPER ENDOSCOPIC ULTRASOUND (EUS) LINEAR;  Surgeon: Milus Banister, MD;  Location: [facility];  Service: Endoscopy;  Laterality: N/A;  . Appendectomy    . Spine surgery      CERVICAL SPINE SURGERY X 2 - INCLUDING FUSION; LUMBAR SURGERY FOR RUPTURED DISC  . Eye surgery      BILATERAL CATARACT EXTRACTIONS  . Laparoscopic abdominal exploration N/A 07/02/2012    Procedure: converted to laparotomy with gastric biopsy;  Surgeon: Imogene Burn. Georgette [place], MD;  Location: [facility];  Service: General;  Laterality: N/A;  Laparoscopic Gastric Biospy attempted.   . Laparotomy N/A 07/07/2012    Procedure: EXPLORATORY LAPAROTOMY repair of gastric perforation with omental graham patch, drainage of abdominal abcess;  Surgeon: Imogene Burn. Georgette [place], MD;  Location: [facility];  Service: General;  Laterality: N/A;  . Stomach surgery  07/07/2012    Omental patch of gastric perforation  . Laparotomy N/A 07/13/2012    Procedure: EXPLORATORY LAPAROTOMY repair gastric leak;  Surgeon: Edward Jolly, MD;  [facility]: [facility];  Service: General;  Laterality: N/A;  . Laparotomy N/A 11/11/2012    Procedure: EXPLORATORY LAPAROTOMY;  Surgeon: Imogene Burn. Georgette [place], MD;  Location: [place];  Service: General;  Laterality: N/A;  . Bowel  resection N/A 11/11/2012    Procedure: SMALL BOWEL RESECTION;  Surgeon: Imogene Burn. Georgette [place], MD;  Location: [place];  Service: General;  Laterality: N/A;  . Minor application of wound vac N/A 11/11/2012    Procedure: APPLICATION OF WOUND VAC;  Surgeon: Imogene Burn. Georgette [place], MD;  Location: [place];  Service: General;  Laterality: N/A;  . Jejunostomy N/A 11/11/2012    Procedure: PLACEMENT OF FEEDING JEJUNOSTOMY TUBE;  Surgeon: Imogene Burn. Georgette [place], MD;  Location: [place];  Service: General;  Laterality: N/A;  . Lysis of adhesion N/A 11/11/2012    Procedure: LYSIS OF ADHESION;  Surgeon: Imogene Burn. Georgette [place], MD;  Location: [place];  Service: General;  Laterality: N/A;  . Hernia repair    . Gastric fistula repair      05/16/2013  . Colonoscopy    . Polypectomy    . Upper gastrointestinal endoscopy    . Abdominal aortagram N/A 08/19/2014    Procedure: ABDOMINAL Maxcine Ham;  Surgeon: Conrad [place], MD;  Location: [facility] C[facility];  Service: Cardiovascular;  Laterality: N/A;  . Abdominal aortic endovascular stent graft N/A 08/26/2014  Procedure: AORTIC ENDOVASCULAR  COVERED STENT ;  Surgeon: Conrad [place], MD;  Location: [place];  Service: Vascular;  Laterality: N/A;  IVUS  . Femoral-femoral bypass graft Bilateral 09/04/2014    Procedure: Right External Iliac and right comon femoral endarterectomy with patching and right to left BYPASS GRAFT FEMORAL-FEMORAL ARTERY, ;  Surgeon: Rosetta Posner, MD;  Location: [place];  Service: Vascular;  Laterality: Bilateral;  . Wound exploration Left 12/30/2014    Procedure: WOUND EXPLORATION;  Surgeon: Conrad [place], MD;  Location: [place];  Service: Vascular;  Laterality: Left;  . Irrigation and debridement abscess Bilateral 12/30/2014    Procedure: IRRIGATION AND DEBRIDEMENT of bilateral groin ABSCESSes;  Surgeon: Conrad [place], MD;  Location: [place];  Service: Vascular;  Laterality: Bilateral;  . Application of wound vac Right 12/30/2014    Procedure: APPLICATION OF WOUND VAC to right groin;  Surgeon: Conrad [place], MD;  Location: [place];  Service: Vascular;  Laterality: Right;  . Femoral-femoral bypass graft Bilateral 12/31/2014    Procedure: REMOVAL INFECTED RIGHT FEMORAL ARTERY TO LEFT FEMORAL ARTERY BYPASS GRAFT;  Surgeon: Conrad [place], MD;  Location: [place];  Service: Vascular;  Laterality: Bilateral;  . Axillary-femoral bypass graft Left 12/31/2014    Procedure: LEFT  AXILLA RY ARTERY TO LEFT FEMORAL ARTERY BYPASS GRAFT;  Surgeon: Conrad [place], MD;  Location: [place];  Service: Vascular;  Laterality: Left;  . Patch angioplasty Right 12/31/2014    Procedure: RIGHT FEMORAL ARTERY PATCH ANGIOPLASTY USING Rueben Bash BIOLOGIC PATCH;  Surgeon: Conrad [place], MD;  Location: [place];  Service: Vascular;  Laterality: Right;  . Intraoperative arteriogram Left 12/31/2014    Procedure: INTRA OPERATIVE ARTERIOGRAM;  Surgeon: Conrad [place], MD;  Location: [place];  Service: Vascular;  Laterality: Left;  . Wound exploration Left 01/11/2015    Procedure: LEFT AXILLARY WOUND EXPLORATION AND DRAINAGE OF SEROMA;  Surgeon: Conrad Brooks, MD;  Location: [place];  Service: Vascular;  Laterality: Left;  . Application of wound vac Left 01/11/2015    Procedure: APPLICATION OF NEGATIVE PRESSURE WOUND DRESSING TO LEFT AXILLA  AND LEFT GROIN;  Surgeon: Conrad [place], MD;  Location: [place];  Service: Vascular;  Laterality: Left;  . Ovarian cyst removal    . Laparotomy N/A 06/07/2015    Procedure: EXPLORATORY LAPAROTOMY, LYSIS OF ADHESIONS,  WITH BOWEL RESECTION OFR SMALL BOWEL INTUSSUSCEPTION;  Surgeon: Arta Bruce Kinsinger, MD;  [facility];  Service: General;  Laterality: N/A;   Allergies  Allergen Reactions  . Avelox [Moxifloxacin Hcl In Nacl] Nausea And Vomiting  . Betadine [Povidone Iodine] Itching and Rash  . Alendronate Sodium Nausea And Vomiting and Other (See Comments)    dizziness  . Aspirin Nausea Only  . Codeine Nausea And Vomiting  . Doxycycline Nausea And Vomiting  . Fluconazole Nausea And Vomiting  . Hydrocodone Nausea And  Vomiting    GI distress  . Hydrocodone-Acetaminophen Nausea And Vomiting  . Morphine And Related Nausea Only  . Neurontin [Gabapentin] Other (See Comments)    Mood changes   . Nsaids Other (See Comments)    Severe gastritis & perforation - avoid NSAIDs when possible  . Quinolones Hives and Itching  . Sertraline Hcl Nausea And Vomiting and Other (See Comments)    Hallucinations   . Sulfamethoxazole Hives and Itching  . Latex Rash  . Sulfa Antibiotics Rash   Prior to Admission medications   Medication Sig Start Date End Date Taking?  Authorizing Provider  acetaminophen (TYLENOL) 500 MG tablet Take 1 tablet (500 mg total) by mouth every 8 (eight) hours as needed for mild pain or moderate pain. Reported on 05/20/2015 Patient not taking: Reported on 07/12/2015 06/14/15   Simbiso Ranga, MD  amLODipine (NORVASC) 5 MG tablet Take 1 tablet (5 mg total) by mouth daily. 06/14/15  Yes Simbiso Ranga, MD  dicyclomine (BENTYL) 20 MG tablet Take 20 mg by mouth 4 (four) times daily -  before meals and at bedtime. Reported on 04/16/2015   Yes Historical Provider, MD  docusate sodium (COLACE) 100 MG capsule Take 1 capsule (100 mg total) by mouth 2 (two) times daily. Patient not taking: Reported on 07/12/2015 06/14/15   Simbiso Ranga, MD  feeding supplement, RESOURCE BREEZE, (RESOURCE BREEZE) LIQD Take 1 Container by mouth 3 (three) times daily between meals. 06/05/14  Yes Modena Jansky, MD  fluocinonide cream (LIDEX) [id number] % Apply 1 application topically 2 (two) times daily as needed. Reported on 05/05/2015 11/07/14  Yes Historical Provider, MD  ketotifen (ZADITOR) 0.025 % ophthalmic solution Place 1 drop into both eyes 2 (two) times daily.   Yes Historical Provider, MD  LORazepam (ATIVAN) 1 MG tablet Take 1 mg by mouth 2 (two) times daily. Reported on 05/20/2015 02/01/15  Yes Historical Provider, MD  Multiple Vitamin (MULTIVITAMIN WITH MINERALS) TABS tablet Take 1 tablet by mouth daily. Patient not taking:  Reported on 07/12/2015 01/12/15   Hulen Shouts Rhyne, PA-C  mupirocin ointment (BACTROBAN) 2 % Place 1 application into the nose 2 (two) times daily. Patient not taking: Reported on 07/12/2015 05/05/15   Robyn Haber, MD  ondansetron (ZOFRAN) 4 MG tablet Take 4 mg by mouth every 8 (eight) hours as needed for nausea or vomiting.   Yes Historical Provider, MD  polyethylene glycol powder (GLYCOLAX/MIRALAX) powder MIX 17 G (1 CAPFUL) IN 8 OZ OF LIQUID 2 TIMES A DAY AS NEEDED 05/24/15  Yes [name] Koyanagi, MD  pregabalin (LYRICA) 50 MG capsule Take 50 mg by mouth 3 (three) times daily. Reported on 07/12/2015    Historical Provider, MD  Probiotic Product (PROBIOTIC DAILY PO) Take 1 capsule by mouth daily.   Yes Historical Provider, MD  ranitidine (ZANTAC) 150 MG tablet TAKE 1 TABLET TWICE DAILY 11/08/14  Yes [name] Koyanagi, MD  Simethicone (GAS-X EXTRA STRENGTH PO) Take 1 tablet by mouth 3 (three) times daily after meals. Reported on 07/12/2015    Historical Provider, MD  traMADol (ULTRAM) 50 MG tablet Take 2 tablets (100 mg total) by mouth every 6 (six) hours as needed. 06/14/15  Yes Simbiso Ranga, MD  vitamin C (ASCORBIC ACID) 500 MG tablet Take 500 mg by mouth daily. Reported on 07/12/2015    Historical Provider, MD  Vitamin D, Ergocalciferol, (DRISDOL) 50000 UNITS CAPS capsule Take 1 capsule (50,000 Units total) by mouth every 7 (seven) days. Patient not taking: Reported on 07/12/2015 06/29/14   Robyn Haber, MD  zinc gluconate 50 MG tablet Take 50 mg by mouth daily.   Yes Historical Provider, MD   Social History   Social History  . Marital Status: Single    Spouse Name: N/A  . Number of Children: 0  . Years of Education: N/A   Occupational History  .     Social History Main Topics  . Smoking status: Former Smoker -- 0.50 packs/day for 35 years    Types: Cigarettes    Start date: 01/04/2003    Quit date: 04/17/2005  . Smokeless tobacco:  Never Used  . Alcohol Use: No  . Drug Use: No  .  Sexual Activity: No   Other Topics Concern  . Not on file   Social History Narrative   Single. Education: [employer]. Exercise.   Review of Systems  Constitutional: Negative for fever, chills and fatigue.  Gastrointestinal: Negative for nausea, vomiting, abdominal pain, diarrhea and Bowen in stool.  Genitourinary: Positive for dysuria, frequency and hematuria. Negative for urgency.  Musculoskeletal: Negative for back pain.  Skin: Positive for rash.      Objective:   Physical Exam  Constitutional: She is oriented to person, place, and time. She appears well-developed and well-nourished. No distress.  HENT:  Head: Normocephalic and atraumatic.  Eyes: EOM are normal. Pupils are equal, round, and reactive to light.  Neck: Neck supple.  Cardiovascular: Normal rate.   Pulmonary/Chest: Effort normal. No respiratory distress.  Abdominal: She exhibits no distension. There is tenderness (minimal) in the suprapubic area. There is no CVA tenderness.  healing scar mid abdomen, vertical scar mid abdomen, no discharge from wound  Musculoskeletal: Normal range of motion.  Neurological: She is alert and oriented to person, place, and time.  Skin: Skin is warm and dry.  Multiple slight hyperpigmentation, minimal excoriated patches over her left forearm to the left cubicle area, no warmth to the area, no other areas with rash  Psychiatric: She has a normal mood and affect. Her behavior is normal.  Nursing note and vitals reviewed.   Filed Vitals:   07/12/15 1456 07/12/15 1506  BP: 152/72 120/60  Pulse: 82   Temp: 98.3 F (36.8 C)   TempSrc: Oral   Resp: 16   Height: 5\' 5"  (1.651 m)   Weight: 83 lb 9.6 oz (37.921 kg)    Results for orders placed or performed in visit on 07/12/15  POCT urinalysis dipstick  Result Value Ref Range   Color, UA yellow yellow   Clarity, UA cloudy (A) clear   Glucose, UA negative negative   Bilirubin, UA negative negative   Ketones, POC UA trace (5) (A)  negative   Spec Grav, UA 1.015    Bowen, UA large (A) negative   pH, UA 6.5    Protein Ur, POC =30 (A) negative   Urobilinogen, UA 0.2    Nitrite, UA Negative Negative   Leukocytes, UA Trace (A) Negative  POCT Microscopic Urinalysis ([facility])  Result Value Ref Range   WBC,UR,HPF,POC Few (A) None WBC/hpf   RBC,UR,HPF,POC Too numerous to count  (A) None RBC/hpf   Bacteria Few (A) None, Too numerous to count   Mucus Absent Absent   Epithelial Cells, UR Per Microscopy Few (A) None, Too numerous to count cells/hpf      Assessment & Plan:     ROSCHELLE BATTA is a 70 y.o. female Dysuria - Plan: POCT urinalysis dipstick, POCT Microscopic Urinalysis ([facility]), Urine culture  Hematuria - Plan: Urine culture  Long term current use of anticoagulant therapy  Rash and nonspecific skin eruption  Hemorrhagic cystitis - Plan: Urine culture, cephALEXin (KEFLEX) 500 MG capsule   Suspected early hemorrhagic cystitis versus cystitis and bleeding from her underlying Coumadin use. Will check urine culture, multiple medication allergies were noted. Started on Keflex and will follow urine culture closely. RTC precautions if not improving within the next few days, or fever or worsening symptoms sooner. Advised to talk to her Coumadin provider to advise she is on antibiotics as this may affect her monitoring level and frequency.  Left arm  rash is improving. Initial pleuritic nature indicates possible insect bite versus contact dermatitis. As this is improving, no further testing needed at this time or creams at this time unless it is itching, can use over-the-counter Cortaid. RTC precautions if any worsening.  Patient walking out prior to end of visit and review of lab work. Did ask her return from the waiting room to discuss her labs and plan in private. She appeared to be frustrated with the wait time, and I apologized for the wait time but did reinforce the need to review her lab work and plan with her.  Understanding expressed.  Meds ordered this encounter  Medications  . cephALEXin (KEFLEX) 500 MG capsule    Sig: Take 1 capsule (500 mg total) by mouth 2 (two) times daily.    Dispense:  14 capsule    Refill:  0   Patient Instructions  As the rash has stopped itching, I suspect it will continue to improve into next week. This could've been either insect bite or possible contact dermatitis. If it does itch, can use some over-the-counter Cortaid steroid cream, but if any increase in size, more redness, or spread of the rash, return for recheck and look into other causes.  You appear to have an early urinary tract infection. Start Keflex one pill twice per day. We'll check a urine culture to make sure this is the correct antibiotic, but if you're not improving the next 4-5 days, or any worsening sooner, recheck here or other care provider. Make sure your Coumadin provider knows you're taking this to make sure they do not need to check your levels more frequently while you are on the antibiotic.   Urinary Tract Infection Urinary tract infections (UTIs) can develop anywhere along your urinary tract. Your urinary tract is your body's drainage system for removing wastes and extra water. Your urinary tract includes two kidneys, two ureters, a bladder, and a urethra. Your kidneys are a pair of bean-shaped organs. Each kidney is about the size of your fist. They are located below your ribs, one on each side of your spine. CAUSES Infections are caused by microbes, which are microscopic organisms, including fungi, viruses, and bacteria. These organisms are so small that they can only be seen through a microscope. Bacteria are the microbes that most commonly cause UTIs. SYMPTOMS  Symptoms of UTIs may vary by age and gender of the patient and by the location of the infection. Symptoms in young women typically include a frequent and intense urge to urinate and a painful, burning feeling in the bladder or  urethra during urination. Older women and men are more likely to be tired, shaky, and weak and have muscle aches and abdominal pain. A fever may mean the infection is in your kidneys. Other symptoms of a kidney infection include pain in your back or sides below the ribs, nausea, and vomiting. DIAGNOSIS To diagnose a UTI, your caregiver will ask you about your symptoms. Your caregiver will also ask you to provide a urine sample. The urine sample will be tested for bacteria and white Bowen cells. White Bowen cells are made by your body to help fight infection. TREATMENT  Typically, UTIs can be treated with medication. Because most UTIs are caused by a bacterial infection, they usually can be treated with the use of antibiotics. The choice of antibiotic and length of treatment depend on your symptoms and the type of bacteria causing your infection. HOME CARE INSTRUCTIONS  If you were prescribed antibiotics,  take them exactly as your caregiver instructs you. Finish the medication even if you feel better after you have only taken some of the medication.  Drink enough water and fluids to keep your urine clear or pale yellow.  Avoid caffeine, tea, and carbonated beverages. They tend to irritate your bladder.  Empty your bladder often. Avoid holding urine for long periods of time.  Empty your bladder before and after sexual intercourse.  After a bowel movement, women should cleanse from front to back. Use each tissue only once. SEEK MEDICAL CARE IF:   You have back pain.  You develop a fever.  Your symptoms do not begin to resolve within 3 days. SEEK IMMEDIATE MEDICAL CARE IF:   You have severe back pain or lower abdominal pain.  You develop chills.  You have nausea or vomiting.  You have continued burning or discomfort with urination. MAKE SURE YOU:   Understand these instructions.  Will watch your condition.  Will get help right away if you are not doing well or get worse.   This  information is not intended to replace advice given to you by your health care provider. Make sure you discuss any questions you have with your health care provider.   Document Released: 01/17/2005 Document Revised: 12/29/2014 Document Reviewed: 05/18/2011 Elsevier Interactive Patient Education 2016 Else[employer] Inc[employer]   Hematuria, Adult Hematuria is Bowen in your urine. It can be caused by a bladder infection, kidney infection, prostate infection, kidney stone, or cancer of your urinary tract. Infections can usually be treated with medicine, and a kidney stone usually will pass through your urine. If neither of these is the cause of your hematuria, further workup to find out the reason may be needed. It is very important that you tell your health care provider about any Bowen you see in your urine, even if the Bowen stops without treatment or happens without causing pain. Bowen in your urine that happens and then stops and then happens again can be a symptom of a very serious condition. Also, pain is not a symptom in the initial stages of many urinary cancers. HOME CARE INSTRUCTIONS   Drink lots of fluid, 3-4 quarts a day. If you have been diagnosed with an infection, cranberry juice is especially recommended, in addition to large amounts of water.  Avoid caffeine, tea, and carbonated beverages because they tend to irritate the bladder.  Avoid alcohol because it may irritate the prostate.  Take all medicines as directed by your health care provider.  If you were prescribed an antibiotic medicine, finish it all even if you start to feel better.  If you have been diagnosed with a kidney stone, follow your health care provider's instructions regarding straining your urine to catch the stone.  Empty your bladder often. Avoid holding urine for long periods of time.  After a bowel movement, women should cleanse front to back. Use each tissue only once.  Empty your bladder before and after sexual  intercourse if you are a female. SEEK MEDICAL CARE IF:  You develop back pain.  You have a fever.  You have a feeling of sickness in your stomach (nausea) or vomiting.  Your symptoms are not better in 3 days. Return sooner if you are getting worse. SEEK IMMEDIATE MEDICAL CARE IF:   You develop severe vomiting and are unable to keep the medicine down.  You develop severe back or abdominal pain despite taking your medicines.  You begin passing a large amount of Bowen  or clots in your urine.  You feel extremely weak or faint, or you pass out. MAKE SURE YOU:   Understand these instructions.  Will watch your condition.  Will get help right away if you are not doing well or get worse.   This information is not intended to replace advice given to you by your health care provider. Make sure you discuss any questions you have with your health care provider.   Document Released: 04/09/2005 Document Revised: 04/30/2014 Document Reviewed: 12/08/2012 Elsevier Interactive Patient Education 2016 [employer] Rash A rash is a change in the color or texture of the skin. There are many different types of rashes. You may have other problems that accompany your rash. CAUSES   Infections.  Allergic reactions. This can include allergies to pets or foods.  Certain medicines.  Exposure to certain chemicals, soaps, or cosmetics.  Heat.  Exposure to poisonous plants.  Tumors, both cancerous and noncancerous. SYMPTOMS   Redness.  Scaly skin.  Itchy skin.  Dry or cracked skin.  Bumps.  Blisters.  Pain. DIAGNOSIS  Your caregiver may do a physical exam to determine what type of rash you have. A skin sample (biopsy) may be taken and examined under a microscope. TREATMENT  Treatment depends on the type of rash you have. Your caregiver may prescribe certain medicines. For serious conditions, you may need to see a skin doctor (dermatologist). HOME CARE INSTRUCTIONS   Avoid the  substance that caused your rash.  Do not scratch your rash. This can cause infection.  You may take cool baths to help stop itching.  Only take over-the-counter or prescription medicines as directed by your caregiver.  Keep all follow-up appointments as directed by your caregiver. SEEK IMMEDIATE MEDICAL CARE IF:  You have increasing pain, swelling, or redness.  You have a fever.  You have new or severe symptoms.  You have body aches, diarrhea, or vomiting.  Your rash is not better after 3 days. MAKE SURE YOU:  Understand these instructions.  Will watch your condition.  Will get help right away if you are not doing well or get worse.   This information is not intended to replace advice given to you by your health care provider. Make sure you discuss any questions you have with your health care provider.   Document Released: 03/30/2002 Document Revised: 04/30/2014 Document Reviewed: 08/25/2014 Elsevier Interactive Patient Education [employer].     I personally performed the services described in this documentation, which was scribed in my presence. The recorded information has been reviewed and considered, and addended by me as needed.

## 2015-07-12 NOTE — Patient Instructions (Addendum)
As the rash has stopped itching, I suspect it will continue to improve into next week. This could've been either insect bite or possible contact dermatitis. If it does itch, can use some over-the-counter Cortaid steroid cream, but if any increase in size, more redness, or spread of the rash, return for recheck and look into other causes.  You appear to have an early urinary tract infection. Start Keflex one pill twice per day. We'll check a urine culture to make sure this is the correct antibiotic, but if you're not improving the next 4-5 days, or any worsening sooner, recheck here or other care provider. Make sure your Coumadin provider knows you're taking this to make sure they do not need to check your levels more frequently while you are on the antibiotic.   Urinary Tract Infection Urinary tract infections (UTIs) can develop anywhere along your urinary tract. Your urinary tract is your body's drainage system for removing wastes and extra water. Your urinary tract includes two kidneys, two ureters, a bladder, and a urethra. Your kidneys are a pair of bean-shaped organs. Each kidney is about the size of your fist. They are located below your ribs, one on each side of your spine. CAUSES Infections are caused by microbes, which are microscopic organisms, including fungi, viruses, and bacteria. These organisms are so small that they can only be seen through a microscope. Bacteria are the microbes that most commonly cause UTIs. SYMPTOMS  Symptoms of UTIs may vary by age and gender of the patient and by the location of the infection. Symptoms in young women typically include a frequent and intense urge to urinate and a painful, burning feeling in the bladder or urethra during urination. Older women and men are more likely to be tired, shaky, and weak and have muscle aches and abdominal pain. A fever may mean the infection is in your kidneys. Other symptoms of a kidney infection include pain in your back or  sides below the ribs, nausea, and vomiting. DIAGNOSIS To diagnose a UTI, your caregiver will ask you about your symptoms. Your caregiver will also ask you to provide a urine sample. The urine sample will be tested for bacteria and white blood cells. White blood cells are made by your body to help fight infection. TREATMENT  Typically, UTIs can be treated with medication. Because most UTIs are caused by a bacterial infection, they usually can be treated with the use of antibiotics. The choice of antibiotic and length of treatment depend on your symptoms and the type of bacteria causing your infection. HOME CARE INSTRUCTIONS  If you were prescribed antibiotics, take them exactly as your caregiver instructs you. Finish the medication even if you feel better after you have only taken some of the medication.  Drink enough water and fluids to keep your urine clear or pale yellow.  Avoid caffeine, tea, and carbonated beverages. They tend to irritate your bladder.  Empty your bladder often. Avoid holding urine for long periods of time.  Empty your bladder before and after sexual intercourse.  After a bowel movement, women should cleanse from front to back. Use each tissue only once. SEEK MEDICAL CARE IF:   You have back pain.  You develop a fever.  Your symptoms do not begin to resolve within 3 days. SEEK IMMEDIATE MEDICAL CARE IF:   You have severe back pain or lower abdominal pain.  You develop chills.  You have nausea or vomiting.  You have continued burning or discomfort with urination. MAKE  SURE YOU:   Understand these instructions.  Will watch your condition.  Will get help right away if you are not doing well or get worse.   This information is not intended to replace advice given to you by your health care provider. Make sure you discuss any questions you have with your health care provider.   Document Released: 01/17/2005 Document Revised: 12/29/2014 Document Reviewed:  05/18/2011 Elsevier Interactive Patient Education 2016 [employer]   Hematuria, Adult Hematuria is blood in your urine. It can be caused by a bladder infection, kidney infection, prostate infection, kidney stone, or cancer of your urinary tract. Infections can usually be treated with medicine, and a kidney stone usually will pass through your urine. If neither of these is the cause of your hematuria, further workup to find out the reason may be needed. It is very important that you tell your health care provider about any blood you see in your urine, even if the blood stops without treatment or happens without causing pain. Blood in your urine that happens and then stops and then happens again can be a symptom of a very serious condition. Also, pain is not a symptom in the initial stages of many urinary cancers. HOME CARE INSTRUCTIONS   Drink lots of fluid, 3-4 quarts a day. If you have been diagnosed with an infection, cranberry juice is especially recommended, in addition to large amounts of water.  Avoid caffeine, tea, and carbonated beverages because they tend to irritate the bladder.  Avoid alcohol because it may irritate the prostate.  Take all medicines as directed by your health care provider.  If you were prescribed an antibiotic medicine, finish it all even if you start to feel better.  If you have been diagnosed with a kidney stone, follow your health care provider's instructions regarding straining your urine to catch the stone.  Empty your bladder often. Avoid holding urine for long periods of time.  After a bowel movement, women should cleanse front to back. Use each tissue only once.  Empty your bladder before and after sexual intercourse if you are a female. SEEK MEDICAL CARE IF:  You develop back pain.  You have a fever.  You have a feeling of sickness in your stomach (nausea) or vomiting.  Your symptoms are not better in 3 days. Return sooner if you are getting  worse. SEEK IMMEDIATE MEDICAL CARE IF:   You develop severe vomiting and are unable to keep the medicine down.  You develop severe back or abdominal pain despite taking your medicines.  You begin passing a large amount of blood or clots in your urine.  You feel extremely weak or faint, or you pass out. MAKE SURE YOU:   Understand these instructions.  Will watch your condition.  Will get help right away if you are not doing well or get worse.   This information is not intended to replace advice given to you by your health care provider. Make sure you discuss any questions you have with your health care provider.   Document Released: 04/09/2005 Document Revised: 04/30/2014 Document Reviewed: 12/08/2012 Elsevier Interactive Patient Education 2016 [employer] Rash A rash is a change in the color or texture of the skin. There are many different types of rashes. You may have other problems that accompany your rash. CAUSES   Infections.  Allergic reactions. This can include allergies to pets or foods.  Certain medicines.  Exposure to certain chemicals, soaps, or cosmetics.  Heat.  Exposure  to poisonous plants.  Tumors, both cancerous and noncancerous. SYMPTOMS   Redness.  Scaly skin.  Itchy skin.  Dry or cracked skin.  Bumps.  Blisters.  Pain. DIAGNOSIS  Your caregiver may do a physical exam to determine what type of rash you have. A skin sample (biopsy) may be taken and examined under a microscope. TREATMENT  Treatment depends on the type of rash you have. Your caregiver may prescribe certain medicines. For serious conditions, you may need to see a skin doctor (dermatologist). HOME CARE INSTRUCTIONS   Avoid the substance that caused your rash.  Do not scratch your rash. This can cause infection.  You may take cool baths to help stop itching.  Only take over-the-counter or prescription medicines as directed by your caregiver.  Keep all follow-up  appointments as directed by your caregiver. SEEK IMMEDIATE MEDICAL CARE IF:  You have increasing pain, swelling, or redness.  You have a fever.  You have new or severe symptoms.  You have body aches, diarrhea, or vomiting.  Your rash is not better after 3 days. MAKE SURE YOU:  Understand these instructions.  Will watch your condition.  Will get help right away if you are not doing well or get worse.   This information is not intended to replace advice given to you by your health care provider. Make sure you discuss any questions you have with your health care provider.   Document Released: 03/30/2002 Document Revised: 04/30/2014 Document Reviewed: 08/25/2014 Elsevier Interactive Patient Education [employer].

## 2015-07-15 ENCOUNTER — Encounter: Payer: Self-pay | Admitting: Vascular Surgery

## 2015-07-15 ENCOUNTER — Other Ambulatory Visit: Payer: Self-pay | Admitting: *Deleted

## 2015-07-15 ENCOUNTER — Ambulatory Visit (INDEPENDENT_AMBULATORY_CARE_PROVIDER_SITE_OTHER): Payer: Medicare Other | Admitting: Vascular Surgery

## 2015-07-15 ENCOUNTER — Telehealth: Payer: Self-pay | Admitting: Internal Medicine

## 2015-07-15 VITALS — BP 120/69 | HR 86 | Temp 98.3°F | Ht 65.0 in | Wt 84.1 lb

## 2015-07-15 DIAGNOSIS — I7025 Atherosclerosis of native arteries of other extremities with ulceration: Secondary | ICD-10-CM

## 2015-07-15 DIAGNOSIS — I70213 Atherosclerosis of native arteries of extremities with intermittent claudication, bilateral legs: Secondary | ICD-10-CM | POA: Diagnosis not present

## 2015-07-15 DIAGNOSIS — I739 Peripheral vascular disease, unspecified: Secondary | ICD-10-CM

## 2015-07-15 MED ORDER — WARFARIN SODIUM 5 MG PO TABS: ORAL_TABLET | ORAL | Status: AC

## 2015-07-15 MED ORDER — OXYCODONE-ACETAMINOPHEN 5-325 MG PO TABS: 1.0000 | ORAL_TABLET | ORAL | Status: DC | PRN

## 2015-07-15 NOTE — Progress Notes (Addendum)
Established Critical Limb Ischemia Patient  History of Present Illness  Cynthia Bowen is a 70 y.o. (28-Jan-1946) female who presents with chief complaint: F/U plastic surgery appointment.  This patient has been released from Plastics as they have nothing to offer her.  She was sent to Wound care at [facility] and they don't think she will heal.  1. 5/5/16Aortic stenting with endograft, PTA+S L CIA 2. 5/15/16R EIA and CFA EA w/ Dacron patch angioplasty, R to L fem-fem BPG 3. 12/30/14 I&D B groin abscesses 4. 9/9/16Exc infected fem-fem BPG, B CFA BPA, L ax-PFA BPG 5. 9/20/16Drainage L axilla seroma  The ulcer in right shin continues to grow in size.  The patient currently has limited ambulation so she does not have intermittent claudication. She also does not have rest pain at this point.  She is healing from her recent abdominal surgery for intussception.  Past Medical History  Diagnosis Date  . COPD (chronic obstructive pulmonary disease) ([place])   . Pneumonia 12-2011  . GERD (gastroesophageal reflux disease)   . Headache(784.0)   . Arthritis     osteoarthritis  . Allergy   . Depression   . Neuromuscular disorder ([place])   . Osteoporosis   . Bronchitis     CURRENTLY AS OF 06/30/12 - HAS COUGH AND FINISHED ANTIBIOTIC FOR BRONCHITIS  . Fibromyalgia   . Pain     ABDOMINAL PAIN AND NAUSEA  . Pain     SOMETIMES PAIN RIGHT EAR AND NECK--STATES CAUSED BY A "LUMP" ON BACK OF EAR--USES KENALOG CREAM TOPICALLY AS NEEDED.  Marland Kitchen Gastrocutaneous fistula   . Anemia   . Anxiety   . Blood transfusion without reported diagnosis   . Heart murmur     young  . Peripheral vascular disease (HCC)     hx  ?leg  . History of kidney stones   . Pulmonary embolism [facility])     Past Surgical History  Procedure Laterality Date  . Abdominal hysterectomy    . Esophagogastroduodenoscopy  04/18/2012    Procedure: ESOPHAGOGASTRODUODENOSCOPY (EGD);  Surgeon: Inda Castle, MD;  Location[facility];  Service: Endoscopy;  Laterality: N/A;  . Eus  05/29/2012    Procedure: UPPER ENDOSCOPIC ULTRASOUND (EUS) LINEAR;  Surgeon: Milus Banister, MD;  Location: [facility];  Service: Endoscopy;  Laterality: N/A;  . Appendectomy    . Spine surgery      CERVICAL SPINE SURGERY X 2 - INCLUDING FUSION; LUMBAR SURGERY FOR RUPTURED DISC  . Eye surgery      BILATERAL CATARACT EXTRACTIONS  . Laparoscopic abdominal exploration N/A 07/02/2012    Procedure: converted to laparotomy with gastric biopsy;  Surgeon: Imogene Burn. Georgette Dover, MD;  Location: [facility];  Service: General;  Laterality: N/A;  Laparoscopic Gastric Biospy attempted.   . Laparotomy N/A 07/07/2012    Procedure: EXPLORATORY LAPAROTOMY repair of gastric perforation with omental graham patch, drainage of abdominal abcess;  Surgeon: Imogene Burn. Georgette Dover, MD;  Location: [facility];  Service: General;  Laterality: N/A;  . Stomach surgery  07/07/2012    Omental patch of gastric perforation  . Laparotomy N/A 07/13/2012    Procedure: EXPLORATORY LAPAROTOMY repair gastric leak;  Surgeon: Edward Jolly, MD;  Location: [facility];  Service: General;  Laterality: N/A;  . Laparotomy N/A 11/11/2012    Procedure: EXPLORATORY LAPAROTOMY;  Surgeon: Imogene Burn. Georgette Dover, MD;  Location: [place];  Service: General;  Laterality: N/A;  . Bowel resection N/A 11/11/2012    Procedure: SMALL  BOWEL RESECTION;  Surgeon: Imogene Burn. Georgette Dover, MD;  Location: [place];  Service: General;  Laterality: N/A;  . Minor application of wound vac N/A 11/11/2012    Procedure: APPLICATION OF WOUND VAC;  Surgeon: Imogene Burn. Georgette Dover, MD;  Location: [place];  Service: General;  Laterality: N/A;  . Jejunostomy N/A 11/11/2012    Procedure: PLACEMENT OF FEEDING JEJUNOSTOMY TUBE;  Surgeon: Imogene Burn. Georgette Dover, MD;  Location: [place];  Service: General;  Laterality: N/A;  . Lysis of adhesion N/A 11/11/2012    Procedure: LYSIS OF ADHESION;  Surgeon: Imogene Burn. Georgette Dover, MD;  Location: [place];  Service:  General;  Laterality: N/A;  . Hernia repair    . Gastric fistula repair      05/16/2013  . Colonoscopy    . Polypectomy    . Upper gastrointestinal endoscopy    . Abdominal aortagram N/A 08/19/2014    Procedure: ABDOMINAL Maxcine Ham;  Surgeon: Conrad Carrabelle, MD;  Location: [facility] CATH LAB;  Service: Cardiovascular;  Laterality: N/A;  . Abdominal aortic endovascular stent graft N/A 08/26/2014    Procedure: AORTIC ENDOVASCULAR  COVERED STENT ;  Surgeon: Conrad Clayton, MD;  Location: [place];  Service: Vascular;  Laterality: N/A;  IVUS  . Femoral-femoral bypass graft Bilateral 09/04/2014    Procedure: Right External Iliac and right comon femoral endarterectomy with patching and right to left BYPASS GRAFT FEMORAL-FEMORAL ARTERY, ;  Surgeon: Rosetta Posner, MD;  Location: [place];  Service: Vascular;  Laterality: Bilateral;  . Wound exploration Left 12/30/2014    Procedure: WOUND EXPLORATION;  Surgeon: Conrad Glenrock, MD;  Location: [place];  Service: Vascular;  Laterality: Left;  . Irrigation and debridement abscess Bilateral 12/30/2014    Procedure: IRRIGATION AND DEBRIDEMENT of bilateral groin ABSCESSes;  Surgeon: Conrad Brownfields, MD;  Location: [place];  Service: Vascular;  Laterality: Bilateral;  . Application of wound vac Right 12/30/2014    Procedure: APPLICATION OF WOUND VAC to right groin;  Surgeon: Conrad Craigmont, MD;  Location: [place];  Service: Vascular;  Laterality: Right;  . Femoral-femoral bypass graft Bilateral 12/31/2014    Procedure: REMOVAL INFECTED RIGHT FEMORAL ARTERY TO LEFT FEMORAL ARTERY BYPASS GRAFT;  Surgeon: Conrad St. Joseph, MD;  Location: [place];  Service: Vascular;  Laterality: Bilateral;  . Axillary-femoral bypass graft Left 12/31/2014    Procedure: LEFT  AXILLA RY ARTERY TO LEFT FEMORAL ARTERY BYPASS GRAFT;  Surgeon: Conrad Pancoastburg, MD;  Location: [place];  Service: Vascular;  Laterality: Left;  . Patch angioplasty Right 12/31/2014    Procedure: RIGHT FEMORAL ARTERY PATCH ANGIOPLASTY USING Rueben Bash BIOLOGIC PATCH;   Surgeon: Conrad Bear Creek, MD;  Location: [place];  Service: Vascular;  Laterality: Right;  . Intraoperative arteriogram Left 12/31/2014    Procedure: INTRA OPERATIVE ARTERIOGRAM;  Surgeon: Conrad Kingston, MD;  Location: [place];  Service: Vascular;  Laterality: Left;  . Wound exploration Left 01/11/2015    Procedure: LEFT AXILLARY WOUND EXPLORATION AND DRAINAGE OF SEROMA;  Surgeon: Conrad Lovingston, MD;  Location: [place];  Service: Vascular;  Laterality: Left;  . Application of wound vac Left 01/11/2015    Procedure: APPLICATION OF NEGATIVE PRESSURE WOUND DRESSING TO LEFT AXILLA  AND LEFT GROIN;  Surgeon: Conrad Livingston, MD;  Location: [place];  Service: Vascular;  Laterality: Left;  . Ovarian cyst removal    . Laparotomy N/A 06/07/2015    Procedure: EXPLORATORY LAPAROTOMY, LYSIS OF ADHESIONS,  WITH BOWEL RESECTION OFR SMALL BOWEL  INTUSSUSCEPTION;  Surgeon: Arta Bruce Kinsinger, MD;  Location[facility];  Service: General;  Laterality: N/A;    Social History   Social History  . Marital Status: Single    Spouse Name: N/A  . Number of Children: 0  . Years of Education: N/A   Occupational History  .     Social History Main Topics  . Smoking status: Former Smoker -- 0.50 packs/day for 35 years    Types: Cigarettes    Start date: 01/04/2003    Quit date: 04/17/2005  . Smokeless tobacco: Never Used  . Alcohol Use: No  . Drug Use: No  . Sexual Activity: No   Other Topics Concern  . Not on file   Social History Narrative   Single. Education: [employer]. Exercise.    Family History  Problem Relation Age of Onset  . COPD Mother   . Hyperlipidemia Mother   . Hypertension Maternal Grandmother   . Stroke Maternal Grandmother   . Colon cancer Neg Hx      Current Outpatient Prescriptions  Medication Sig Dispense Refill  . amLODipine (NORVASC) 5 MG tablet Take 1 tablet (5 mg total) by mouth daily. 30 tablet 0  . cephALEXin (KEFLEX) 500 MG capsule Take 1 capsule (500 mg total) by mouth 2 (two) times daily. 14  capsule 0  . dicyclomine (BENTYL) 20 MG tablet Take 20 mg by mouth 4 (four) times daily -  before meals and at bedtime. Reported on 04/16/2015    . feeding supplement, RESOURCE BREEZE, (RESOURCE BREEZE) LIQD Take 1 Container by mouth 3 (three) times daily between meals.    . fluocinonide cream (LIDEX) [id number] % Apply 1 application topically 2 (two) times daily as needed. Reported on 05/05/2015    . ketotifen (ZADITOR) 0.025 % ophthalmic solution Place 1 drop into both eyes 2 (two) times daily.    Marland Kitchen LORazepam (ATIVAN) 1 MG tablet Take 1 mg by mouth 2 (two) times daily. Reported on 05/20/2015    . ondansetron (ZOFRAN) 4 MG tablet Take 4 mg by mouth every 8 (eight) hours as needed for nausea or vomiting.    . polyethylene glycol powder (GLYCOLAX/MIRALAX) powder MIX 17 G (1 CAPFUL) IN 8 OZ OF LIQUID 2 TIMES A DAY AS NEEDED 527 g 1  . pregabalin (LYRICA) 50 MG capsule Take 50 mg by mouth 3 (three) times daily. Reported on 07/12/2015    . Probiotic Product (PROBIOTIC DAILY PO) Take 1 capsule by mouth daily.    . ranitidine (ZANTAC) 150 MG tablet TAKE 1 TABLET TWICE DAILY 180 tablet 0  . Simethicone (GAS-X EXTRA STRENGTH PO) Take 1 tablet by mouth 3 (three) times daily after meals. Reported on 07/12/2015    . traMADol (ULTRAM) 50 MG tablet Take 2 tablets (100 mg total) by mouth every 6 (six) hours as needed. 30 tablet 0  . vitamin C (ASCORBIC ACID) 500 MG tablet Take 500 mg by mouth daily. Reported on 07/12/2015    . zinc gluconate 50 MG tablet Take 50 mg by mouth daily.    Marland Kitchen acetaminophen (TYLENOL) 500 MG tablet Take 1 tablet (500 mg total) by mouth every 8 (eight) hours as needed for mild pain or moderate pain. Reported on 05/20/2015 (Patient not taking: Reported on 07/12/2015) 30 tablet 0  . docusate sodium (COLACE) 100 MG capsule Take 1 capsule (100 mg total) by mouth 2 (two) times daily. (Patient not taking: Reported on 07/12/2015) 10 capsule 0  . Multiple Vitamin (MULTIVITAMIN WITH MINERALS) TABS  tablet Take 1  tablet by mouth daily. (Patient not taking: Reported on 07/12/2015)    . mupirocin ointment (BACTROBAN) 2 % Place 1 application into the nose 2 (two) times daily. (Patient not taking: Reported on 07/12/2015) 30 g 3  . Vitamin D, Ergocalciferol, (DRISDOL) 50000 UNITS CAPS capsule Take 1 capsule (50,000 Units total) by mouth every 7 (seven) days. (Patient not taking: Reported on 07/12/2015) 12 capsule 3   No current facility-administered medications for this visit.     Allergies  Allergen Reactions  . Avelox [Moxifloxacin Hcl In Nacl] Nausea And Vomiting  . Betadine [Povidone Iodine] Itching and Rash  . Alendronate Sodium Nausea And Vomiting and Other (See Comments)    dizziness  . Aspirin Nausea Only  . Codeine Nausea And Vomiting  . Doxycycline Nausea And Vomiting  . Fluconazole Nausea And Vomiting  . Hydrocodone Nausea And Vomiting    GI distress  . Hydrocodone-Acetaminophen Nausea And Vomiting  . Morphine And Related Nausea Only  . Neurontin [Gabapentin] Other (See Comments)    Mood changes   . Nsaids Other (See Comments)    Severe gastritis & perforation - avoid NSAIDs when possible  . Quinolones Hives and Itching  . Sertraline Hcl Nausea And Vomiting and Other (See Comments)    Hallucinations   . Sulfamethoxazole Hives and Itching  . Latex Rash  . Sulfa Antibiotics Rash    REVIEW OF SYSTEMS:  (Positives checked otherwise negative)  CARDIOVASCULAR:   [ ]  chest pain,  [ ]  chest pressure,  [ ]  palpitations,  [ ]  shortness of breath when laying flat,  [ ]  shortness of breath with exertion,   [ ]  pain in feet when walking,  [ ]  pain in feet when laying flat, [ ]  history of blood clot in veins (DVT),  [ ]  history of phlebitis,  [ ]  swelling in legs,  [ ]  varicose veins  PULMONARY:   [ ]  productive cough,  [ ]  asthma,  [ ]  wheezing  NEUROLOGIC:   [ ]  weakness in arms or legs,  [ ]  numbness in arms or legs,  [ ]  difficulty speaking or slurred speech,  [ ]  temporary  loss of vision in one eye,  [ ]  dizziness  HEMATOLOGIC:   [ ]  bleeding problems,  [ ]  problems with blood clotting too easily  MUSCULOSKEL:   [ ]  joint pain, [ ]  joint swelling  GASTROINTEST:   [ ]  vomiting blood,  [ ]  blood in stool     GENITOURINARY:   [ ]  burning with urination,  [ ]  blood in urine  PSYCHIATRIC:   [ ]  history of major depression  INTEGUMENTARY:   [ ]  rashes,  [ ]  ulcers  CONSTITUTIONAL:   [ ]  fever,  [ ]  chills   Physical Examination  Filed Vitals:   07/15/15 1050  BP: 120/69  Pulse: 86  Temp: 98.3 F (36.8 C)  TempSrc: Oral  Height: 5\' 5"  (1.651 m)  Weight: 84 lb 1.6 oz (38.148 kg)  SpO2: 97%   Body mass index is 14 kg/(m^2).   General: A&O x 3, WD, Cachectic, Ill appearing  Eyes: PERRLA, EOMI  Pulmonary: Sym exp, good air movt, CTAB, no rales, rhonchi, & wheezing  Cardiac: RRR, Nl S1, S2, no Murmurs, rubs or gallops  Vascular: Vessel Right Left  Radial Palpable Palpable  Brachial Palpable Palpable  Carotid Palpable, without bruit Palpable, without bruit  Aorta Not palpable N/A  Femoral Palpable Not Palpable  Popliteal  Not palpable Not palpable  PT Not Palpable Not Palpable  DP Not Palpable Not Palpable   Gastrointestinal: soft, appropriately TTP, incision healing, -G/R  Musculoskeletal: M/S 5/5 throughout , Exposed tendon in R shin ulcer with some granulation, more exposed tendon then last time  Neurologic: Pain and light touch intact in extremities , Motor exam as listed above  Non-Invasive Vascular Imaging ABI (Date: 06/17/2015)  R: 0.45 (0.62), DP: mono, PT: mono  L: 0.63 (0.56), DP: mono, PT: mono  BLE arterial duplex (06/17/2015)  R: mono CFA and PFA, occluded SFA, DM tibial  L: patent axillo-PFA graft, with intact collateral flow to mono pop, mono AT, DM PT   Medical Decision Making  ADAI DITMORE is a 70 y.o. (09/04/1945) female  who presents with: RLE critical limb ischemia  with anterior shin ulcer, s/p multiple revascularization procedures listed above   At this point, this patient has to consider R BKA vs AKA.  As a last ditch effort, I will offer her: redo-redo-redo R CFA exposure, R CFA to BK popliteal bypass with composite graft, debridement of right shin ulcer.  Her BK popliteal artery is essentially a glorified tibial artery likely due to intraluminal disease.  I have explain to the patient she is at high risk for limb loss and the procedure is likely to have poor long term patency due to lack of conduit and inadequate distal target. The risk, benefits, and alternative for bypass operations were discussed with the patient.   The patient is aware the risks include but are not limited to: bleeding, infection, myocardial infarction, stroke, limb loss, nerve damage, need for additional procedures in the future, wound complications, and inability to complete the bypass.   The patient is aware of these risks and agreed to proceed.  I have scheduled her 26 APR 17 to give her more time to heal up from her recent abdominal surgery.  I am concerned that with her downward spiral that she is at high risk for mortality in the next year.  Thank you for allowing [place] to participate in this patient's care.   Adele Barthel, MD [facility] [place] Office: [phone] Pager: [phone]  06/17/2015, 5:45 PM  Addendum  Prescribed her: percocet 5/325 mg 1-2 po q6 hr prin pain #50 no refill.  She is scheduled for surgery for atherosclerosis with ulceration as documented above.  Adele Barthel, MD [facility] [place] Office: [phone] Pager: [phone]  07/15/2015, 11:50 AM

## 2015-07-15 NOTE — Addendum Note (Signed)
Addended by: Conrad St. Bonaventure on: 07/15/2015 11:51 AM   Modules accepted: Orders

## 2015-07-15 NOTE — Telephone Encounter (Signed)
NewMessage  Pt requested to speak w/ rN- concerning her beginning an antibiotic. Please call back and discuss.

## 2015-07-15 NOTE — Telephone Encounter (Signed)
Patient started on Keflex 500 mg twice a day 3 days ago Was advised to let cardiologist know since she was on Warfarin Ok to take Keflex and Warfarin together PT/INR to be done Tuesday 3/28 by [facility] and will send RF to pharmacy as requested  Claiborne Billings D aware

## 2015-07-18 ENCOUNTER — Ambulatory Visit (INDEPENDENT_AMBULATORY_CARE_PROVIDER_SITE_OTHER): Payer: Medicare Other | Admitting: Physician Assistant

## 2015-07-18 VITALS — BP 100/60 | HR 81 | Temp 99.3°F | Resp 16 | Ht 65.0 in

## 2015-07-18 DIAGNOSIS — N2889 Other specified disorders of kidney and ureter: Secondary | ICD-10-CM | POA: Diagnosis not present

## 2015-07-18 DIAGNOSIS — N200 Calculus of kidney: Secondary | ICD-10-CM | POA: Diagnosis not present

## 2015-07-18 DIAGNOSIS — R319 Hematuria, unspecified: Secondary | ICD-10-CM

## 2015-07-18 DIAGNOSIS — I70213 Atherosclerosis of native arteries of extremities with intermittent claudication, bilateral legs: Secondary | ICD-10-CM | POA: Diagnosis not present

## 2015-07-18 LAB — POC MICROSCOPIC URINALYSIS (UMFC): Mucus: ABSENT

## 2015-07-18 LAB — POCT URINALYSIS DIP (MANUAL ENTRY)
GLUCOSE UA: NEGATIVE
NITRITE UA: NEGATIVE
Protein Ur, POC: 100 — AB
Spec Grav, UA: 1.015
UROBILINOGEN UA: 1
pH, UA: 5.5

## 2015-07-18 LAB — POCT CBC
GRANULOCYTE PERCENT: 64.1 % (ref 37–80)
HEMATOCRIT: 34.2 % — AB (ref 37.7–47.9)
HEMOGLOBIN: 12 g/dL — AB (ref 12.2–16.2)
LYMPH, POC: 2.3 (ref 0.6–3.4)
MCH, POC: 30.3 pg (ref 27–31.2)
MCHC: 35.1 g/dL (ref 31.8–35.4)
MCV: 86.4 fL (ref 80–97)
MID (cbc): 0.3 (ref 0–0.9)
MPV: 7 fL (ref 0–99.8)
POC GRANULOCYTE: 4.7 (ref 2–6.9)
POC LYMPH PERCENT: 31.3 %L (ref 10–50)
POC MID %: 4.6 % (ref 0–12)
Platelet Count, POC: 225 10*3/uL (ref 142–424)
RBC: 3.96 M/uL — AB (ref 4.04–5.48)
RDW, POC: 17.5 %
WBC: 7.4 10*3/uL (ref 4.6–10.2)

## 2015-07-18 MED ORDER — NITROFURANTOIN MONOHYD MACRO 100 MG PO CAPS: 100.0000 mg | ORAL_CAPSULE | Freq: Two times a day (BID) | ORAL | Status: AC

## 2015-07-18 NOTE — Patient Instructions (Addendum)
macrobid twice a day for 7 days. Drink plenty of water I will call you with your lab results. You will get a phone call to make appt with urologist.    IF you received an x-ray today, you will receive an invoice from [facility]. Please contact [facility] at [phone] with questions or concerns regarding your invoice.   IF you received labwork today, you will receive an invoice from [employer]. Please contact [facility] at [phone] with questions or concerns regarding your invoice.   Our billing staff will not be able to assist you with questions regarding bills from these companies.  You will be contacted with the lab results as soon as they are available. The fastest way to get your results is to activate your My Chart account. Instructions are located on the last page of this paperwork. If you have not heard from [place] regarding the results in 2 weeks, please contact this office.

## 2015-07-18 NOTE — Progress Notes (Signed)
[facility] [address] [place] [address] [phone]  Date:  07/18/2015   Name:  Cynthia Bowen   DOB:  18-Jan-1946   MRN:  [id number]  PCP:  [name] Koyanagi, MD    Chief Complaint: Follow-up   History of Present Illness:  This is a 70 y.o. female with PMH COPD, HTN, CAD, hx SBO from intussusception, hx PE on coumadin, PAD with nonhealing venous ulcer on right foot who is presenting with hematuria. She was here 1 week ago on 3/21 and seen by Dr. Carlota Raspberry. UA at that time with few WBC, TNTC rbc, few bacteria, neg nitrites. She was complaining of vaginal itching, dysuria and urinary freq at that time. She was prescribed keflex 500 mg bid x 7 days. Urine culture was ordered but apparently was never collected. She states she took all keflex. 2 days ago was feeling completely better. This morning she woke with hematuria again, more than before. Toilet bowl looks red after urinating. She is not having any dysuria or vaginal itching. Was having a little urinary freq last night but not today. Denies fever, chills, n/v, abdominal pain, back pain, vaginal discharge. She is not sexually active.  Pt was in the hospital 06/06/15 - 06/14/15 d/t acute small bowel obstruction d/t intussusception s/p bowel resection with reanastomosis by Dr. Kieth Brightly. CT abdomen that diagnosed the bowel obstruction incidentally noted:  "BILATERAL renal cysts. 15 x 10 mm nodule at posterior aspect upper pole LEFT kidney, concerning for renal neoplasm. Multiple BILATERAL nonobstructing renal calculi largest 10 mm diameter RIGHT kidney"  There was no discussion in discharge summary about this neoplasm found. No plan for follow up. When asked pt about this, pt states she never was told anything about a kidney mass. She does not currently have a urologist. She states she has been told before that she has kidney stones, apparently noted on scans before, but as far as she knows she has never had  problems with them.  No recent wt loss, fever, chills. She is quite malnourished and underweight but this has been an ongoing issue for patient for several years, ever since a surgery complication in [id number].  Pt sees a vascular surgeon for a nonhealing wound on right foot/ankle. She last saw 1 week ago. She states he wants to amputate her foot.  Last INR 3/14 therapeutic at 2.7. She gets her INR checked again tomorrow, 3/28.  Review of Systems:  Review of Systems See HPI  Patient Active Problem List   Diagnosis Date Noted  . Atherosclerosis of native arteries of the extremities with ulceration ([place]) 06/17/2015  . Essential hypertension 06/12/2015  . COPD exacerbation ([place]) 06/09/2015  . Leukocytosis 06/08/2015  . SBO (small bowel obstruction) ([place]) 06/07/2015  . Intussusception intestine ([place]) 06/07/2015  . Long term (current) use of anticoagulants [Z79.01] 05/03/2015  . Enteritis due to Clostridium difficile 02/16/2015  . Ankle fracture, right 02/03/2015  . Abnormal finding on mammography 12/31/2014  . Groin abscess 12/30/2014  . Groin pain 12/29/2014  . Chronic pain associated with significant psychosocial dysfunction 12/15/2014  . Wound drainage-Right groin 11/15/2014  . Ischemia of lower extremity 09/05/2014  . Abdominal aortic stenosis 09/03/2014  . PAD (peripheral artery disease) ([place]) 08/26/2014  . Discoloration of skin- Left Heel 08/13/2014  . Left foot pain- Heel 08/13/2014  . Leg heaviness-Bilateral 08/13/2014  . Protein-calorie malnutrition, severe ([place]) 06/04/2014  . Cellulitis of hand 06/02/2014  . Hypokalemia 06/02/2014  . Osteoporosis 05/22/2014  .  Recurrent abdominal pain--multiple causes, status post GI surgery with multiple complications [id number]  . Abnormal CT scan 02/08/2014  . Atherosclerosis of native arteries of extremity with intermittent claudication ([place]) 01/15/2014  . Nephrolithiasis-asymptomatic/noted on scan 10/08/2013  . Other and  unspecified ovarian cyst--noted on scans 2014 2015/asymptomatic 10/08/2013  . Pancreatic lesion-6 mm noted on MRI at [facility] 10/08/2013  . Femoral artery stenosis-bilateral noted CT at [facility] 2015 10/08/2013  . History of pulmonary embolism 08/05/2012  . Incarcerated epigastric hernia s/p primary repair 07/02/2012 07/11/2012  . 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 [id number] 07/10/2012  . Pericardial effusion 04/17/2012  . COPD GOLD I 01/29/2012  . Back pain DDD S/p surgery [place] 1993 12/29/2011  . Depression with anxiety 04/02/2011  . GERD (gastroesophageal reflux disease) 04/02/2011  . Osteoarthritis 04/02/2011  . Cervical neck pain with evidence of disc disease-s/p surgery [id number] 04/02/2011  . Fibromyalgia-[place] 2004 04/02/2011    Prior to Admission medications   Medication Sig Start Date End Date Taking? Authorizing Provider  amLODipine (NORVASC) 5 MG tablet Take 1 tablet (5 mg total) by mouth daily. 06/14/15  Yes Simbiso Ranga, MD  cephALEXin (KEFLEX) 500 MG capsule Take 1 capsule (500 mg total) by mouth 2 (two) times daily. 07/12/15  Yes Wendie Agreste, MD  dicyclomine (BENTYL) 20 MG tablet Take 20 mg by mouth 4 (four) times daily -  before meals and at bedtime. Reported on 04/16/2015   Yes Historical Provider, MD  feeding supplement, RESOURCE BREEZE, (RESOURCE BREEZE) LIQD Take 1 Container by mouth 3 (three) times daily between meals. 06/05/14  Yes Modena Jansky, MD  fluocinonide cream (LIDEX) [id number] % Apply 1 application topically 2 (two) times daily as needed. Reported on 05/05/2015 11/07/14  Yes Historical Provider, MD  ketotifen (ZADITOR) 0.025 % ophthalmic solution Place 1 drop into both eyes 2 (two) times daily.   Yes Historical Provider, MD  LORazepam (ATIVAN) 1 MG tablet Take 1 mg by mouth 2 (two) times daily. Reported on 05/20/2015 02/01/15  Yes Historical Provider, MD  ondansetron (ZOFRAN) 4 MG tablet Take 4 mg by  mouth every 8 (eight) hours as needed for nausea or vomiting.   Yes Historical Provider, MD  oxyCODONE-acetaminophen (PERCOCET/ROXICET) 5-325 MG tablet Take 1-2 tablets by mouth every 4 (four) hours as needed for moderate pain. 07/15/15  Yes Conrad [place], MD  polyethylene glycol powder (GLYCOLAX/MIRALAX) powder MIX 17 G (1 CAPFUL) IN 8 OZ OF LIQUID 2 TIMES A DAY AS NEEDED 05/24/15  Yes [name] Koyanagi, MD  pregabalin (LYRICA) 50 MG capsule Take 50 mg by mouth 3 (three) times daily. Reported on 07/12/2015   Yes Historical Provider, MD  Probiotic Product (PROBIOTIC DAILY PO) Take 1 capsule by mouth daily.   Yes Historical Provider, MD  ranitidine (ZANTAC) 150 MG tablet TAKE 1 TABLET TWICE DAILY 11/08/14  Yes [name] Koyanagi, MD  Simethicone (GAS-X EXTRA STRENGTH PO) Take 1 tablet by mouth 3 (three) times daily after meals. Reported on 07/12/2015   Yes Historical Provider, MD  traMADol (ULTRAM) 50 MG tablet Take 2 tablets (100 mg total) by mouth every 6 (six) hours as needed. 06/14/15  Yes Simbiso Ranga, MD  vitamin C (ASCORBIC ACID) 500 MG tablet Take 500 mg by mouth daily. Reported on 07/12/2015   Yes Historical Provider, MD  warfarin (COUMADIN) 5 MG tablet Take one tablet by mouth daily except 1/2 tablet on Mon. & Fri. Or as directed by the [facility] 07/15/15  Yes Pixie Casino, MD  zinc gluconate 50 MG tablet Take 50 mg by mouth daily.   Yes Historical Provider, MD       Nat Math, MD       Nat Math, MD       Gabriel Earing, PA-C       Robyn Haber, MD       Robyn Haber, MD    Allergies  Allergen Reactions  . Avelox [Moxifloxacin Hcl In Nacl] Nausea And Vomiting  . Betadine [Povidone Iodine] Itching and Rash  . Alendronate Sodium Nausea And Vomiting and Other (See Comments)    dizziness  . Aspirin Nausea Only  . Codeine Nausea And Vomiting  . Doxycycline Nausea And Vomiting  . Fluconazole Nausea And Vomiting  . Hydrocodone Nausea And Vomiting    GI distress  .  Hydrocodone-Acetaminophen Nausea And Vomiting  . Morphine And Related Nausea Only  . Neurontin [Gabapentin] Other (See Comments)    Mood changes   . Nsaids Other (See Comments)    Severe gastritis & perforation - avoid NSAIDs when possible  . Quinolones Hives and Itching  . Sertraline Hcl Nausea And Vomiting and Other (See Comments)    Hallucinations   . Sulfamethoxazole Hives and Itching  . Latex Rash  . Sulfa Antibiotics Rash    Past Surgical History  Procedure Laterality Date  . Abdominal hysterectomy    . Esophagogastroduodenoscopy  04/18/2012    Procedure: ESOPHAGOGASTRODUODENOSCOPY (EGD);  Surgeon: Inda Castle, MD;  Location: [facility];  Service: Endoscopy;  Laterality: N/A;  . Eus  05/29/2012    Procedure: UPPER ENDOSCOPIC ULTRASOUND (EUS) LINEAR;  Surgeon: Milus Banister, MD;  Location: [facility];  Service: Endoscopy;  Laterality: N/A;  . Appendectomy    . Spine surgery      CERVICAL SPINE SURGERY X 2 - INCLUDING FUSION; LUMBAR SURGERY FOR RUPTURED DISC  . Eye surgery      BILATERAL CATARACT EXTRACTIONS  . Laparoscopic abdominal exploration N/A 07/02/2012    Procedure: converted to laparotomy with gastric biopsy;  Surgeon: Imogene Burn. Georgette Dover, MD;  Location: [facility];  Service: General;  Laterality: N/A;  Laparoscopic Gastric Biospy attempted.   . Laparotomy N/A 07/07/2012    Procedure: EXPLORATORY LAPAROTOMY repair of gastric perforation with omental graham patch, drainage of abdominal abcess;  Surgeon: Imogene Burn. Georgette Dover, MD;  Location: [facility];  Service: General;  Laterality: N/A;  . Stomach surgery  07/07/2012    Omental patch of gastric perforation  . Laparotomy N/A 07/13/2012    Procedure: EXPLORATORY LAPAROTOMY repair gastric leak;  Surgeon: Edward Jolly, MD;  Location: [facility];  Service: General;  Laterality: N/A;  . Laparotomy N/A 11/11/2012    Procedure: EXPLORATORY LAPAROTOMY;  Surgeon: Imogene Burn. Georgette Dover, MD;  Location: [place];  Service: General;  Laterality:  N/A;  . Bowel resection N/A 11/11/2012    Procedure: SMALL BOWEL RESECTION;  Surgeon: Imogene Burn. Georgette Dover, MD;  Location: [place];  Service: General;  Laterality: N/A;  . Minor application of wound vac N/A 11/11/2012    Procedure: APPLICATION OF WOUND VAC;  Surgeon: Imogene Burn. Georgette Dover, MD;  Location: [place];  Service: General;  Laterality: N/A;  . Jejunostomy N/A 11/11/2012    Procedure: PLACEMENT OF FEEDING JEJUNOSTOMY TUBE;  Surgeon: Imogene Burn. Georgette Dover, MD;  Location: [place];  Service: General;  Laterality: N/A;  . Lysis of adhesion N/A 11/11/2012    Procedure: LYSIS OF ADHESION;  Surgeon: Imogene Burn. Tsuei, MD;  Location: [facility];  Service: General;  Laterality: N/A;  . Hernia repair    . Gastric fistula repair      05/16/2013  . Colonoscopy    . Polypectomy    . Upper gastrointestinal endoscopy    . Abdominal aortagram N/A 08/19/2014    Procedure: ABDOMINAL Maxcine Ham;  Surgeon: Conrad [place], MD;  Location: [facility] C[facility] LAB;  Service: Cardiovascular;  Laterality: N/A;  . Abdominal aortic endovascular stent graft N/A 08/26/2014    Procedure: AORTIC ENDOVASCULAR  COVERED STENT ;  Surgeon: Conrad Vincent, MD;  Location: [place];  Service: Vascular;  Laterality: N/A;  IVUS  . Femoral-femoral bypass graft Bilateral 09/04/2014    Procedure: Right External Iliac and right comon femoral endarterectomy with patching and right to left BYPASS GRAFT FEMORAL-FEMORAL ARTERY, ;  Surgeon: Rosetta Posner, MD;  Location: [place];  Service: Vascular;  Laterality: Bilateral;  . Wound exploration Left 12/30/2014    Procedure: WOUND EXPLORATION;  Surgeon: Conrad T[place], MD;  Location: [place];  Service: Vascular;  Laterality: Left;  . Irrigation and debridement abscess Bilateral 12/30/2014    Procedure: IRRIGATION AND DEBRIDEMENT of bilateral groin ABSCESSes;  Surgeon: Conrad [place], MD;  Location: [place];  Service: Vascular;  Laterality: Bilateral;  . Application of wound vac Right 12/30/2014    Procedure: APPLICATION OF WOUND VAC to right groin;   Surgeon: Conrad [place], MD;  Location: [place];  Service: Vascular;  Laterality: Right;  . Femoral-femoral bypass graft Bilateral 12/31/2014    Procedure: REMOVAL INFECTED RIGHT FEMORAL ARTERY TO LEFT FEMORAL ARTERY BYPASS GRAFT;  Surgeon: Conrad [place], MD;  Location: [place];  Service: Vascular;  Laterality: Bilateral;  . Axillary-femoral bypass graft Left 12/31/2014    Procedure: LEFT  AXILLA RY ARTERY TO LEFT FEMORAL ARTERY BYPASS GRAFT;  Surgeon: Conrad [place], MD;  Location: [place];  Service: Vascular;  Laterality: Left;  . Patch angioplasty Right 12/31/2014    Procedure: RIGHT FEMORAL ARTERY PATCH ANGIOPLASTY USING Rueben Bash BIOLOGIC PATCH;  Surgeon: Conrad Inman, MD;  Location: [place];  Service: Vascular;  Laterality: Right;  . Intraoperative arteriogram Left 12/31/2014    Procedure: INTRA OPERATIVE ARTERIOGRAM;  Surgeon: Conrad [place], MD;  Location: [place];  Service: Vascular;  Laterality: Left;  . Wound exploration Left 01/11/2015    Procedure: LEFT AXILLARY WOUND EXPLORATION AND DRAINAGE OF SEROMA;  Surgeon: Conrad [place], MD;  Location: [place];  Service: Vascular;  Laterality: Left;  . Application of wound vac Left 01/11/2015    Procedure: APPLICATION OF NEGATIVE PRESSURE WOUND DRESSING TO LEFT AXILLA  AND LEFT GROIN;  Surgeon: Conrad Nerstrand, MD;  Location: [place];  Service: Vascular;  Laterality: Left;  . Ovarian cyst removal    . Laparotomy N/A 06/07/2015    Procedure: EXPLORATORY LAPAROTOMY, LYSIS OF ADHESIONS,  WITH BOWEL RESECTION OFR SMALL BOWEL INTUSSUSCEPTION;  Surgeon: Arta Bruce Kinsinger, MD;  Location: [facility];  Service: General;  Laterality: N/A;    Social History  Substance Use Topics  . Smoking status: Former Smoker -- 0.50 packs/day for 35 years    Types: Cigarettes    Start date: 01/04/2003    Quit date: 04/17/2005  . Smokeless tobacco: Never Used  . Alcohol Use: No    Family History  Problem Relation Age of Onset  . COPD Mother   . Hyperlipidemia Mother   . Hypertension Maternal  Grandmother   . Stroke Maternal Grandmother   . Colon  cancer Neg Hx     Medication list has been reviewed and updated.  Physical Examination:  Physical Exam  Constitutional: She is oriented to person, place, and time. She appears well-developed and well-nourished. No distress.  HENT:  Head: Normocephalic and atraumatic.  Right Ear: Hearing normal.  Left Ear: Hearing normal.  Nose: Nose normal.  Eyes: Conjunctivae and lids are normal. Right eye exhibits no discharge. Left eye exhibits no discharge. No scleral icterus.  Cardiovascular: Normal rate, regular rhythm, normal heart sounds and normal pulses.   No murmur heard. Pulmonary/Chest: Effort normal and breath sounds normal. No respiratory distress. She has no wheezes. She has no rhonchi. She has no rales.  Abdominal: Soft. Normal appearance. There is no tenderness. There is no CVA tenderness.  Musculoskeletal: Normal range of motion.  Lymphadenopathy:       Head (right side): No submental, no submandibular and no tonsillar adenopathy present.       Head (left side): No submental, no submandibular and no tonsillar adenopathy present.    She has no cervical adenopathy.  Neurological: She is alert and oriented to person, place, and time.  Skin: Skin is warm, dry and intact. No lesion and no rash noted.  Psychiatric: She has a normal mood and affect. Her speech is normal and behavior is normal. Thought content normal.   BP 100/60 mmHg  Pulse 81  Temp(Src) 99.3 F (37.4 C) (Oral)  Resp 16  Ht 5\' 5"  (1.651 m)  SpO2 98%  Results for orders placed or performed in visit on 07/18/15  POCT Microscopic Urinalysis ([facility])  Result Value Ref Range   WBC,UR,HPF,POC Few (A) None WBC/hpf   RBC,UR,HPF,POC Too numerous to count  (A) None RBC/hpf   Bacteria None None, Too numerous to count   Mucus Absent Absent   Epithelial Cells, UR Per Microscopy Few (A) None, Too numerous to count cells/hpf  POCT urinalysis dipstick  Result Value Ref  Range   Color, UA brown (A) yellow   Clarity, UA cloudy (A) clear   Glucose, UA negative negative   Bilirubin, UA small (A) negative   Ketones, POC UA trace (5) (A) negative   Spec Grav, UA 1.015    Blood, UA large (A) negative   pH, UA 5.5    Protein Ur, POC =100 (A) negative   Urobilinogen, UA 1.0    Nitrite, UA Negative Negative   Leukocytes, UA Trace (A) Negative  POCT CBC  Result Value Ref Range   WBC 7.4 4.6 - 10.2 K/uL   Lymph, poc 2.3 0.6 - 3.4   POC LYMPH PERCENT 31.3 10 - 50 %L   MID (cbc) 0.3 0 - 0.9   POC MID % 4.6 0 - 12 %M   POC Granulocyte 4.7 2 - 6.9   Granulocyte percent 64.1 37 - 80 %G   RBC 3.96 (A) 4.04 - 5.48 M/uL   Hemoglobin 12.0 (A) 12.2 - 16.2 g/dL   HCT, POC 34.2 (A) 37.7 - 47.9 %   MCV 86.4 80 - 97 fL   MCH, POC 30.3 27 - 31.2 pg   MCHC 35.1 31.8 - 35.4 g/dL   RDW, POC 17.5 %   Platelet Count, POC 225 142 - 424 K/uL   MPV 7.0 0 - 99.8 fL   Assessment and Plan:  1. Hematuria 2. Blood in urine 3. Kidney mass 4. Nephrolithiasis  Will treat with macrobid pending urine culture. CBC wnl. CMP pending. Found that there was a left kidney mass,  suspected neoplasm, found incidentally on CT abdomen while she was in hospital for intussusception 05/2015. Also large bilateral kidney stones noted on CT. There was nothing is discharge summary that discussed this mass or follow up. I am referring to urology for further evaluation. - nitrofurantoin, macrocrystal-monohydrate, (MACROBID) 100 MG capsule; Take 1 capsule (100 mg total) by mouth 2 (two) times daily.  Dispense: 14 capsule; Refill: 0 - Ambulatory referral to Urology - POCT Microscopic Urinalysis ([facility]) - POCT urinalysis dipstick - POCT CBC - Urine culture - Comprehensive metabolic panel  Benjaman Pott. Drenda Freeze, MHS [facility]  07/19/2015

## 2015-07-19 LAB — COMPREHENSIVE METABOLIC PANEL
ALBUMIN: 3.8 g/dL (ref 3.6–5.1)
ALK PHOS: 75 U/L (ref 33–130)
ALT: 11 U/L (ref 6–29)
AST: 17 U/L (ref 10–35)
BILIRUBIN TOTAL: 0.2 mg/dL (ref 0.2–1.2)
BUN: 11 mg/dL (ref 7–25)
CHLORIDE: 105 mmol/L (ref 98–110)
CO2: 28 mmol/L (ref 20–31)
CREATININE: 0.89 mg/dL (ref 0.50–0.99)
Calcium: 9.1 mg/dL (ref 8.6–10.4)
Glucose, Bld: 88 mg/dL (ref 65–99)
Potassium: 3.6 mmol/L (ref 3.5–5.3)
SODIUM: 139 mmol/L (ref 135–146)
TOTAL PROTEIN: 6.8 g/dL (ref 6.1–8.1)

## 2015-07-19 LAB — POCT INR: INR: 1.9

## 2015-07-20 LAB — URINE CULTURE

## 2015-07-21 ENCOUNTER — Ambulatory Visit (INDEPENDENT_AMBULATORY_CARE_PROVIDER_SITE_OTHER): Payer: Federal, State, Local not specified - PPO | Admitting: Pharmacist Clinician (PhC)/ Clinical Pharmacy Specialist

## 2015-07-21 DIAGNOSIS — I739 Peripheral vascular disease, unspecified: Secondary | ICD-10-CM

## 2015-07-21 DIAGNOSIS — Z7901 Long term (current) use of anticoagulants: Secondary | ICD-10-CM

## 2015-07-26 ENCOUNTER — Other Ambulatory Visit (HOSPITAL_COMMUNITY): Payer: Self-pay | Admitting: Urology

## 2015-07-26 ENCOUNTER — Telehealth: Payer: Self-pay | Admitting: Internal Medicine

## 2015-07-26 ENCOUNTER — Other Ambulatory Visit: Payer: Self-pay | Admitting: *Deleted

## 2015-07-26 ENCOUNTER — Other Ambulatory Visit: Payer: Self-pay | Admitting: Internal Medicine

## 2015-07-26 ENCOUNTER — Other Ambulatory Visit: Payer: Self-pay | Admitting: Family Medicine

## 2015-07-26 DIAGNOSIS — M810 Age-related osteoporosis without current pathological fracture: Secondary | ICD-10-CM

## 2015-07-26 DIAGNOSIS — D49512 Neoplasm of unspecified behavior of left kidney: Secondary | ICD-10-CM

## 2015-07-26 NOTE — Telephone Encounter (Addendum)
Patient came in to be seen but after being told of the wait time she got very upset.  She did not want to have to wait 3 hours for refills on her meds.  She wanted a refill on Lyrica and vitamin D.  She stated that she been to a pain clinic and does not like them.  I called the pharmacy and the last refill she had of Lyrica was on 05/21/15 for 50mg  #120 by Catalina Pizza at the pain clinic ([facility]).  I tried calling today to get info her but they were closed.  Can you refill this for her or would you like for [place] to call the pain clinic or just tell her to get it from them?

## 2015-07-26 NOTE — Telephone Encounter (Signed)
Will forward for dr hilty's review 

## 2015-07-26 NOTE — Telephone Encounter (Signed)
Ok to refill both for 1 month but let her know she will need the pain clinic to keep up with her pain meds as i retire and so does dr Joseph Art

## 2015-07-26 NOTE — Telephone Encounter (Signed)
Request for surgical clearance:  1. What type of surgery is being performed? Rt Ureteroscopy   2. When is this surgery scheduled? Have not scheduled yet   3. Are there any medications that need to be held prior to surgery and how long?Would like to stop his Coumadin 5 days prior to surgery   4. Name of physician performing surgery? Dr Kathie Rhodes  5. What is your office phone and fax number? [phone] and Fax#[phone]  6.

## 2015-07-26 NOTE — Telephone Encounter (Signed)
I refer to pt as his Coumadin,the pt is a female. It should be stop her Coumadin.

## 2015-07-27 LAB — POCT INR: INR: 2.9

## 2015-07-27 MED ORDER — VITAMIN D (ERGOCALCIFEROL) 1.25 MG (50000 UNIT) PO CAPS: 50000.0000 [IU] | ORAL_CAPSULE | ORAL | Status: AC

## 2015-07-27 MED ORDER — PREGABALIN 50 MG PO CAPS: 50.0000 mg | ORAL_CAPSULE | Freq: Three times a day (TID) | ORAL | Status: DC

## 2015-07-27 NOTE — Telephone Encounter (Signed)
Rxs sent

## 2015-07-28 ENCOUNTER — Ambulatory Visit (INDEPENDENT_AMBULATORY_CARE_PROVIDER_SITE_OTHER): Payer: Medicare Other | Admitting: Pharmacist Clinician (PhC)/ Clinical Pharmacy Specialist

## 2015-07-28 ENCOUNTER — Other Ambulatory Visit: Payer: Self-pay

## 2015-07-28 ENCOUNTER — Telehealth: Payer: Self-pay | Admitting: Internal Medicine

## 2015-07-28 DIAGNOSIS — I739 Peripheral vascular disease, unspecified: Secondary | ICD-10-CM

## 2015-07-28 DIAGNOSIS — Z7901 Long term (current) use of anticoagulants: Secondary | ICD-10-CM

## 2015-07-28 NOTE — Telephone Encounter (Signed)
Patient was advised to have Urology call us once date is set for procedure.

## 2015-07-28 NOTE — Telephone Encounter (Signed)
Will route to Erasmo Downer, PharmD for review. Called and LM with Pam at Dr Simone Curia office that Dr Debara Pickett woud recommend lovenox bridge.

## 2015-07-28 NOTE — Telephone Encounter (Signed)
Request for surgical clearance:  1. What type of surgery is being performed? Fem-Pop Bypass   2. When is this surgery scheduled? May 3,2017  3. Are there any medications that need to be held prior to surgery and how long?Need to stop Coumadin 5 days prior to surgery-last dose on 08-18-15   4. Name of physician performing surgery? Dr Adele Barthel   5. What is your office phone and fax number? [phone] and Fax#-[phone]   6.

## 2015-07-28 NOTE — Telephone Encounter (Signed)
She is on warfarin for severe PAD (followed by Dr. Bridgett Larsson) and history of PE in the past. From my standpoint, she could hold warfarin for 5 days, but would recommend lovenox bridge - hopefully Erasmo Downer can help arrange this.  Dr. Debara Pickett

## 2015-07-28 NOTE — Telephone Encounter (Signed)
Routing to Dr Debara Pickett for advice.

## 2015-07-29 ENCOUNTER — Ambulatory Visit (INDEPENDENT_AMBULATORY_CARE_PROVIDER_SITE_OTHER): Payer: Medicare Other | Admitting: Internal Medicine

## 2015-07-29 ENCOUNTER — Ambulatory Visit (HOSPITAL_COMMUNITY)
Admission: RE | Admit: 2015-07-29 | Discharge: 2015-07-29 | Disposition: A | Discharge status: Home / Self Care | Payer: Medicare Other | Source: Ambulatory Visit | Attending: Vascular Surgery | Admitting: Vascular Surgery

## 2015-07-29 ENCOUNTER — Encounter: Payer: Self-pay | Admitting: Internal Medicine

## 2015-07-29 ENCOUNTER — Other Ambulatory Visit: Payer: Self-pay | Admitting: Urology

## 2015-07-29 VITALS — BP 150/80 | HR 81 | Temp 97.6°F | Resp 16 | Ht 65.0 in | Wt 86.0 lb

## 2015-07-29 DIAGNOSIS — I739 Peripheral vascular disease, unspecified: Secondary | ICD-10-CM

## 2015-07-29 DIAGNOSIS — I70213 Atherosclerosis of native arteries of extremities with intermittent claudication, bilateral legs: Secondary | ICD-10-CM

## 2015-07-29 DIAGNOSIS — Z01818 Encounter for other preprocedural examination: Secondary | ICD-10-CM | POA: Diagnosis not present

## 2015-07-29 DIAGNOSIS — K219 Gastro-esophageal reflux disease without esophagitis: Secondary | ICD-10-CM | POA: Diagnosis not present

## 2015-07-29 DIAGNOSIS — E876 Hypokalemia: Secondary | ICD-10-CM | POA: Diagnosis not present

## 2015-07-29 LAB — BASIC METABOLIC PANEL
BUN: 9 mg/dL (ref 7–25)
CALCIUM: 9.1 mg/dL (ref 8.6–10.4)
CO2: 23 mmol/L (ref 20–31)
CREATININE: 0.64 mg/dL (ref 0.50–0.99)
Chloride: 110 mmol/L (ref 98–110)
Glucose, Bld: 60 mg/dL — ABNORMAL LOW (ref 65–99)
Potassium: 3.8 mmol/L (ref 3.5–5.3)
Sodium: 142 mmol/L (ref 135–146)

## 2015-07-29 MED ORDER — PREGABALIN 50 MG PO CAPS: 50.0000 mg | ORAL_CAPSULE | Freq: Three times a day (TID) | ORAL | Status: AC

## 2015-07-29 MED ORDER — RANITIDINE HCL 150 MG PO TABS: 150.0000 mg | ORAL_TABLET | Freq: Two times a day (BID) | ORAL | Status: AC

## 2015-07-29 MED ORDER — OXYCODONE-ACETAMINOPHEN 5-325 MG PO TABS: 1.0000 | ORAL_TABLET | ORAL | Status: DC | PRN

## 2015-07-29 NOTE — Telephone Encounter (Signed)
Spoke with Pam at Dr. Simone Curia office.  Patient is scheduled for procedure on 08/19/15 @ 1130am at [facility] This procedure was ordered as patient has gross hematuria.  The patient also has an MRI scheduled on 4/12  Patient also has procedure with Dr. Bridgett Larsson on 08/24/15

## 2015-07-29 NOTE — Patient Instructions (Signed)
     IF you received an x-ray today, you will receive an invoice from [facility]. Please contact [facility] at [phone] with questions or concerns regarding your invoice.   IF you received labwork today, you will receive an invoice from [facility]. Please contact [facility] at [phone] with questions or concerns regarding your invoice.   Our billing staff will not be able to assist you with questions regarding bills from these companies.  You will be contacted with the lab results as soon as they are available. The fastest way to get your results is to activate your My Chart account. Instructions are located on the last page of this paperwork. If you have not heard from us regarding the results in 2 weeks, please contact this office.      

## 2015-07-29 NOTE — Telephone Encounter (Signed)
LM for Pam with Dr. Simone Curia office to call back with surgery date.

## 2015-07-29 NOTE — Progress Notes (Addendum)
Subjective:  By signing my name below, I, Raven Small, attest that this documentation has been prepared under the direction and in the presence of [name], MD.  Electronically Signed: Thea Alken, ED Scribe. 07/29/2015. 3:29 PM.   Patient ID: Cynthia Bowen, female    DOB: 1945/05/07, 70 y.o.   MRN: [id number]  HPI Chief Complaint  Patient presents with  . lab    pt says needs to check  potassium    HPI Comments: Cynthia Bowen is a 70 y.o. female who presents to the [facility] to have her potassium checked. Pt states she had blood work done at [facility] 2 weeks ago. She received a call yesterday and was told her potassium is low. Pt states she tries to eat a banana everyday. Not on diuretic. Is malnourished/low calorie intake. Asymptomatic gi now.  Pt states she has blood in her urine. She states she was seen 2 weeks ago at [facility] and was told she it was coming from her right kidney. She states she has been treated with 2 abx. She has ureteroscopy scheduled for 4/28 with Dr. Karsten Ro. She denies dysuria.   Ulcer to right lower leg- she is scheduled for a graft 5/3 with Dr. Bridgett Larsson. Is having lots of pain and is out of meds per Dr Bridgett Larsson 2 w ago. Pt would also like a medication refill of zantac    Patient Active Problem List   Diagnosis Date Noted  . Atherosclerosis of native arteries of the extremities with ulceration ([place]) 06/17/2015  . Essential hypertension 06/12/2015  . COPD exacerbation ([place]) 06/09/2015  . Leukocytosis 06/08/2015  . SBO (small bowel obstruction) ([place]) 06/07/2015  . Intussusception intestine ([place]) 06/07/2015  . Long term (current) use of anticoagulants [Z79.01] 05/03/2015  . Enteritis due to Clostridium difficile 02/16/2015  . Ankle fracture, right 02/03/2015  . Abnormal finding on mammography 12/31/2014  . Groin abscess 12/30/2014  . Groin pain 12/29/2014  . Chronic pain associated with significant psychosocial dysfunction  12/15/2014  . Wound drainage-Right groin 11/15/2014  . Ischemia of lower extremity 09/05/2014  . Abdominal aortic stenosis 09/03/2014  . PAD (peripheral artery disease) ([place]) 08/26/2014  . Discoloration of skin- Left Heel 08/13/2014  . Left foot pain- Heel 08/13/2014  . Leg heaviness-Bilateral 08/13/2014  . Protein-calorie malnutrition, severe ([place]) 06/04/2014  . Cellulitis of hand 06/02/2014  . Hypokalemia 06/02/2014  . Osteoporosis 05/22/2014  . Recurrent abdominal pain--multiple causes, status post GI surgery with multiple complications [id number]  . Abnormal CT scan 02/08/2014  . Atherosclerosis of native arteries of extremity with intermittent claudication ([place]) 01/15/2014  . Nephrolithiasis-asymptomatic/noted on scan 10/08/2013  . Other and unspecified ovarian cyst--noted on scans 2014 2015/asymptomatic 10/08/2013  . Pancreatic lesion-6 mm noted on [facility] at [facility] 10/08/2013  . Femoral artery stenosis-bilateral noted CT at [facility] 2015 10/08/2013  . History of pulmonary embolism 08/05/2012  . Incarcerated epigastric hernia s/p primary repair 07/02/2012 07/11/2012  . 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 [id number] 07/10/2012  . Pericardial effusion 04/17/2012  . COPD GOLD I 01/29/2012  . Back pain DDD S/p surgery [place] 1993 12/29/2011  . Depression with anxiety 04/02/2011  . GERD (gastroesophageal reflux disease) 04/02/2011  . Osteoarthritis 04/02/2011  . Cervical neck pain with evidence of disc disease-s/p surgery [id number] 04/02/2011  . Fibromyalgia-[place] 2004 04/02/2011   Past Medical History  Diagnosis Date  . COPD (chronic obstructive pulmonary disease) ([place])   . Pneumonia  12-2011  . GERD (gastroesophageal reflux disease)   . Headache(784.0)   . Arthritis     osteoarthritis  . Allergy   . Depression   . Neuromuscular disorder ([place])   . Osteoporosis   . Bronchitis     CURRENTLY AS OF 06/30/12 - HAS  COUGH AND FINISHED ANTIBIOTIC FOR BRONCHITIS  . Fibromyalgia   . Pain     ABDOMINAL PAIN AND NAUSEA  . Pain     SOMETIMES PAIN RIGHT EAR AND NECK--STATES CAUSED BY A "LUMP" ON BACK OF EAR--USES KENALOG CREAM TOPICALLY AS NEEDED.  Marland Kitchen Gastrocutaneous fistula   . Anemia   . Anxiety   . Blood transfusion without reported diagnosis   . Heart murmur     young  . Peripheral vascular disease (HCC)     hx  ?leg  . History of kidney stones   . Pulmonary embolism [facility])    Past Surgical History  Procedure Laterality Date  . Abdominal hysterectomy    . Esophagogastroduodenoscopy  04/18/2012    Procedure: ESOPHAGOGASTRODUODENOSCOPY (EGD);  Surgeon: Inda Castle, MD;  [facility];  Service: Endoscopy;  Laterality: N/A;  . Eus  05/29/2012    Procedure: UPPER ENDOSCOPIC ULTRASOUND (EUS) LINEAR;  Surgeon: Milus Banister, MD;  Location: [facility];  Service: Endoscopy;  Laterality: N/A;  . Appendectomy    . Spine surgery      CERVICAL SPINE SURGERY X 2 - INCLUDING FUSION; LUMBAR SURGERY FOR RUPTURED DISC  . Eye surgery      BILATERAL CATARACT EXTRACTIONS  . Laparoscopic abdominal exploration N/A 07/02/2012    Procedure: converted to laparotomy with gastric biopsy;  Surgeon: Imogene Burn. Georgette Dover, MD;  Location: [facility];  Service: General;  Laterality: N/A;  Laparoscopic Gastric Biospy attempted.   . Laparotomy N/A 07/07/2012    Procedure: EXPLORATORY LAPAROTOMY repair of gastric perforation with omental graham patch, drainage of abdominal abcess;  Surgeon: Imogene Burn. Georgette Dover, MD;  [facility];  Service: General;  Laterality: N/A;  . Stomach surgery  07/07/2012    Omental patch of gastric perforation  . Laparotomy N/A 07/13/2012    Procedure: EXPLORATORY LAPAROTOMY repair gastric leak;  Surgeon: Edward Jolly, MD;  [facility];  Service: General;  Laterality: N/A;  . Laparotomy N/A 11/11/2012    Procedure: EXPLORATORY LAPAROTOMY;  Surgeon: Imogene Burn. Georgette Dover, MD;  Location: [place];   Service: General;  Laterality: N/A;  . Bowel resection N/A 11/11/2012    Procedure: SMALL BOWEL RESECTION;  Surgeon: Imogene Burn. Georgette Dover, MD;  Location: [place];  Service: General;  Laterality: N/A;  . Minor application of wound vac N/A 11/11/2012    Procedure: APPLICATION OF WOUND VAC;  Surgeon: Imogene Burn. Georgette Dover, MD;  Location: [place];  Service: General;  Laterality: N/A;  . Jejunostomy N/A 11/11/2012    Procedure: PLACEMENT OF FEEDING JEJUNOSTOMY TUBE;  Surgeon: Imogene Burn. Georgette Dover, MD;  Location: [place];  Service: General;  Laterality: N/A;  . Lysis of adhesion N/A 11/11/2012    Procedure: LYSIS OF ADHESION;  Surgeon: Imogene Burn. Georgette Dover, MD;  Location: [place];  Service: General;  Laterality: N/A;  . Hernia repair    . Gastric fistula repair      05/16/2013  . Colonoscopy    . Polypectomy    . Upper gastrointestinal endoscopy    . Abdominal aortagram N/A 08/19/2014    Procedure: ABDOMINAL Maxcine Ham;  Surgeon: Conrad [place], MD;  Location: [facility] CATH LAB;  Service: Cardiovascular;  Laterality:  N/A;  . Abdominal aortic endovascular stent graft N/A 08/26/2014    Procedure: AORTIC ENDOVASCULAR  COVERED STENT ;  Surgeon: Conrad Dayton, MD;  Location: [place];  Service: Vascular;  Laterality: N/A;  IVUS  . Femoral-femoral bypass graft Bilateral 09/04/2014    Procedure: Right External Iliac and right comon femoral endarterectomy with patching and right to left BYPASS GRAFT FEMORAL-FEMORAL ARTERY, ;  Surgeon: Rosetta Posner, MD;  Location: [place];  Service: Vascular;  Laterality: Bilateral;  . Wound exploration Left 12/30/2014    Procedure: WOUND EXPLORATION;  Surgeon: Conrad [place], MD;  Location: [place];  Service: Vascular;  Laterality: Left;  . Irrigation and debridement abscess Bilateral 12/30/2014    Procedure: IRRIGATION AND DEBRIDEMENT of bilateral groin ABSCESSes;  Surgeon: Conrad Collinston, MD;  Location: [place];  Service: Vascular;  Laterality: Bilateral;  . Application of wound vac Right 12/30/2014    Procedure: APPLICATION OF  WOUND VAC to right groin;  Surgeon: Conrad [place], MD;  Location: [place];  Service: Vascular;  Laterality: Right;  . Femoral-femoral bypass graft Bilateral 12/31/2014    Procedure: REMOVAL INFECTED RIGHT FEMORAL ARTERY TO LEFT FEMORAL ARTERY BYPASS GRAFT;  Surgeon: Conrad Willey, MD;  Location: [place];  Service: Vascular;  Laterality: Bilateral;  . Axillary-femoral bypass graft Left 12/31/2014    Procedure: LEFT  AXILLA RY ARTERY TO LEFT FEMORAL ARTERY BYPASS GRAFT;  Surgeon: Conrad [place], MD;  Location: [place];  Service: Vascular;  Laterality: Left;  . Patch angioplasty Right 12/31/2014    Procedure: RIGHT FEMORAL ARTERY PATCH ANGIOPLASTY USING Rueben Bash BIOLOGIC PATCH;  Surgeon: Conrad Huguley, MD;  Location: [place];  Service: Vascular;  Laterality: Right;  . Intraoperative arteriogram Left 12/31/2014    Procedure: INTRA OPERATIVE ARTERIOGRAM;  Surgeon: Conrad Gambier, MD;  Location: [place];  Service: Vascular;  Laterality: Left;  . Wound exploration Left 01/11/2015    Procedure: LEFT AXILLARY WOUND EXPLORATION AND DRAINAGE OF SEROMA;  Surgeon: Conrad Upton, MD;  Location: [place];  Service: Vascular;  Laterality: Left;  . Application of wound vac Left 01/11/2015    Procedure: APPLICATION OF NEGATIVE PRESSURE WOUND DRESSING TO LEFT AXILLA  AND LEFT GROIN;  Surgeon: Conrad , MD;  Location: [place];  Service: Vascular;  Laterality: Left;  . Ovarian cyst removal    . Laparotomy N/A 06/07/2015    Procedure: EXPLORATORY LAPAROTOMY, LYSIS OF ADHESIONS,  WITH BOWEL RESECTION OFR SMALL BOWEL INTUSSUSCEPTION;  Surgeon: Arta Bruce Kinsinger, MD;  [facility];  Service: General;  Laterality: N/A;   Allergies  Allergen Reactions  . Avelox [Moxifloxacin Hcl In Nacl] Nausea And Vomiting  . Betadine [Povidone Iodine] Itching and Rash  . Alendronate Sodium Nausea And Vomiting and Other (See Comments)    dizziness  . Aspirin Nausea Only  . Codeine Nausea And Vomiting  . Doxycycline Nausea And Vomiting  . Fluconazole  Nausea And Vomiting  . Hydrocodone Nausea And Vomiting    GI distress  . Hydrocodone-Acetaminophen Nausea And Vomiting  . Morphine And Related Nausea Only  . Neurontin [Gabapentin] Other (See Comments)    Mood changes   . Nsaids Other (See Comments)    Severe gastritis & perforation - avoid NSAIDs when possible  . Quinolones Hives and Itching  . Sertraline Hcl Nausea And Vomiting and Other (See Comments)    Hallucinations   . Sulfamethoxazole Hives and Itching  . Latex Rash  . Sulfa Antibiotics Rash  Prior to Admission medications   Medication Sig Start Date End Date Taking? Authorizing Provider  acetaminophen (TYLENOL) 500 MG tablet Take 1 tablet (500 mg total) by mouth every 8 (eight) hours as needed for mild pain or moderate pain. Reported on 05/20/2015 06/14/15  Yes Simbiso Ranga, MD  dicyclomine (BENTYL) 10 MG capsule TAKE TWO CAPSULES (20MG ) 4 TIMES A DAY (BEFORE MEALS AND AT BEDTIME) 07/28/15  Yes [name] Koyanagi, MD  dicyclomine (BENTYL) 20 MG tablet Take 20 mg by mouth 4 (four) times daily -  before meals and at bedtime. Reported on 04/16/2015   Yes Historical Provider, MD  docusate sodium (COLACE) 100 MG capsule Take 1 capsule (100 mg total) by mouth 2 (two) times daily. 06/14/15  Yes Simbiso Ranga, MD  feeding supplement, RESOURCE BREEZE, (RESOURCE BREEZE) LIQD Take 1 Container by mouth 3 (three) times daily between meals. 06/05/14  Yes Modena Jansky, MD  fluocinonide cream (LIDEX) [id number] % Apply 1 application topically 2 (two) times daily as needed. Reported on 05/05/2015 11/07/14  Yes Historical Provider, MD  ketotifen (ZADITOR) 0.025 % ophthalmic solution Place 1 drop into both eyes 2 (two) times daily.   Yes Historical Provider, MD  Multiple Vitamin (MULTIVITAMIN WITH MINERALS) TABS tablet Take 1 tablet by mouth daily. 01/12/15  Yes Samantha J Rhyne, PA-C  mupirocin ointment (BACTROBAN) 2 % Place 1 application into the nose 2 (two) times daily. 05/05/15  Yes Robyn Haber, MD    ondansetron (ZOFRAN) 4 MG tablet Take 4 mg by mouth every 8 (eight) hours as needed for nausea or vomiting.   Yes Historical Provider, MD  oxyCODONE-acetaminophen (PERCOCET/ROXICET) 5-325 MG tablet Take 1-2 tablets by mouth every 4 (four) hours as needed for moderate pain. 07/15/15  Yes Conrad [place]ou, MD  polyethylene glycol powder (GLYCOLAX/MIRALAX) powder MIX 17 G (1 CAPFUL) IN 8 OZ OF LIQUID 2 TIMES A DAY AS NEEDED 05/24/15  Yes [name] Koyanagi, MD  pregabalin (LYRICA) 50 MG capsule Take 1 capsule (50 mg total) by mouth 3 (three) times daily. Reported on 07/12/2015 07/27/15  Yes [name] Koyanagi, MD  Probiotic Product (PROBIOTIC DAILY PO) Take 1 capsule by mouth daily.   Yes Historical Provider, MD  ranitidine (ZANTAC) 150 MG tablet TAKE 1 TABLET TWICE DAILY 11/08/14  Yes [name] Koyanagi, MD  vitamin C (ASCORBIC ACID) 500 MG tablet Take 500 mg by mouth daily. Reported on 07/12/2015   Yes Historical Provider, MD  Vitamin D, Ergocalciferol, (DRISDOL) 50000 units CAPS capsule Take 1 capsule (50,000 Units total) by mouth every 7 (seven) days. 07/27/15  Yes [name] Koyanagi, MD  warfarin (COUMADIN) 5 MG tablet Take one tablet by mouth daily except 1/2 tablet on Mon. & Fri. Or as directed by the [facility] 07/15/15  Yes Pixie Casino, MD  zinc gluconate 50 MG tablet Take 50 mg by mouth daily.   Yes Historical Provider, MD   Social History   Social History  . Marital Status: Single    Spouse Name: N/A  . Number of Children: 0  . Years of Education: N/A   Occupational History  .     Social History Main Topics  . Smoking status: Former Smoker -- 0.50 packs/day for 35 years    Types: Cigarettes    Start date: 01/04/2003    Quit date: 04/17/2005  . Smokeless tobacco: Never Used  . Alcohol Use: No  . Drug Use: No  . Sexual Activity: No   Other Topics Concern  .  Not on file   Social History Narrative   Single. Education: [employer]. Exercise.   Review of Systems  Constitutional:  Negative for fever and chills.  Genitourinary: Positive for hematuria.    Objective:   Physical Exam  Constitutional: She is oriented to person, place, and time. She appears well-developed and well-nourished. No distress.  HENT:  Head: Normocephalic and atraumatic.  Eyes: Pupils are equal, round, and reactive to light.  Neck: Normal range of motion.  Cardiovascular: Normal rate and regular rhythm.   Pulmonary/Chest: Effort normal. No respiratory distress.  Musculoskeletal: Normal range of motion.  Neurological: She is alert and oriented to person, place, and time.  Skin: Skin is warm and dry.  Psychiatric: She has a normal mood and affect. Her behavior is normal.  Nursing note and vitals reviewed.     Filed Vitals:   07/29/15 1455  BP: 150/80  Pulse: 81  Temp: 97.6 F (36.4 C)  TempSrc: Oral  Resp: 16  Height: 5\' 5"  (1.651 m)  Weight: 86 lb (39.009 kg)  SpO2: 98%    Assessment & Plan:  Hypokalemia - Plan: Basic metabolic panel  PAD (peripheral artery disease) (HCC) w/ vasc ulcer needing graft--pain secondary  Hematuria eval ongoing  Meds ordered this encounter  Medications  . oxyCODONE-acetaminophen (PERCOCET/ROXICET) 5-325 MG tablet    Sig: Take 1-2 tablets by mouth every 4 (four) hours as needed for moderate pain.    Dispense:  60 tablet    Refill:  0  . pregabalin (LYRICA) 50 MG capsule    Sig: Take 1 capsule (50 mg total) by mouth 3 (three) times daily. Reported on 07/12/2015    Dispense:  90 capsule    Refill:  5  . ranitidine (ZANTAC) 150 MG tablet    Sig: Take 1 tablet (150 mg total) by mouth 2 (two) times daily.    Dispense:  180 tablet    Refill:  3    I have completed the patient encounter in its entirety as documented by the scribe, with editing by me where necessary. Ariya Bohannon P. Laney Pastor, M.D.

## 2015-07-29 NOTE — Telephone Encounter (Signed)
Cleared for surgery - intermediate risk. Continue lovenox between procedures as per discuss with Erasmo Downer, our anticoagulation pharmacist.  Dr. Lemmie Evens

## 2015-07-29 NOTE — Telephone Encounter (Signed)
Clearance sent to Dr. Karsten Ro by Dr. Debara Pickett via EPIC.

## 2015-07-29 NOTE — Telephone Encounter (Signed)
Surgical clearance routed to Dr. Bridgett Larsson.  Routed via [facility] fax

## 2015-08-02 NOTE — Patient Instructions (Addendum)
Cynthia Bowen  08/02/2015   Your procedure is scheduled on: Friday 08/19/2015  Report to [facility]  Entrance take [place]  elevators to 3rd floor to  [place] at  0930  AM.  Call this number if you have problems the morning of surgery [phone]   Remember: ONLY 1 PERSON MAY GO WITH YOU TO SHORT STAY TO GET  READY MORNING OF [place].   Do not eat food or drink liquids :After Midnight.     Take these medicines the morning of surgery with A SIP OF WATER: Lyrica, Zantac, Percocet if needed                                  You may not have any metal on your body including hair pins and              piercings  Do not wear jewelry, make-up, lotions, powders or perfumes, deodorant             Do not wear nail polish.  Do not shave  48 hours prior to surgery.              Men may shave face and neck.   Do not bring valuables to the hospital. [place].  Contacts, dentures or bridgework may not be worn into surgery.  Leave suitcase in the car. After surgery it may be brought to your room.     Patients discharged the day of surgery will not be allowed to drive home.  Name and phone number of your driver:  Special Instructions: N/A              Please read over the following fact sheets you were given: _____________________________________________________________________             [facility] - Preparing for Surgery Before surgery, you can play an important role.  Because skin is not sterile, your skin needs to be as free of germs as possible.  You can reduce the number of germs on your skin by washing with CHG (chlorahexidine gluconate) soap before surgery.  CHG is an antiseptic cleaner which kills germs and bonds with the skin to continue killing germs even after washing. Please DO NOT use if you have an allergy to CHG or antibacterial soaps.  If your skin becomes reddened/irritated stop  using the CHG and inform your nurse when you arrive at Short Stay. Do not shave (including legs and underarms) for at least 48 hours prior to the first CHG shower.  You may shave your face/neck. Please follow these instructions carefully:  1.  Shower with CHG Soap the night before surgery and the  morning of Surgery.  2.  If you choose to wash your hair, wash your hair first as usual with your  normal  shampoo.  3.  After you shampoo, rinse your hair and body thoroughly to remove the  shampoo.                           4.  Use CHG as you would any other liquid soap.  You can apply chg directly  to the skin and wash  Gently with a scrungie or clean washcloth.  5.  Apply the CHG Soap to your body ONLY FROM THE NECK DOWN.   Do not use on face/ open                           Wound or open sores. Avoid contact with eyes, ears mouth and genitals (private parts).                       Wash face,  Genitals (private parts) with your normal soap.             6.  Wash thoroughly, paying special attention to the area where your surgery  will be performed.  7.  Thoroughly rinse your body with warm water from the neck down.  8.  DO NOT shower/wash with your normal soap after using and rinsing off  the CHG Soap.                9.  Pat yourself dry with a clean towel.            10.  Wear clean pajamas.            11.  Place clean sheets on your bed the night of your first shower and do not  sleep with pets. Day of Surgery : Do not apply any lotions/deodorants the morning of surgery.  Please wear clean clothes to the hospital/surgery center.  FAILURE TO FOLLOW THESE INSTRUCTIONS MAY RESULT IN THE CANCELLATION OF YOUR SURGERY PATIENT SIGNATURE_________________________________  NURSE SIGNATURE__________________________________  ________________________________________________________________________

## 2015-08-03 ENCOUNTER — Encounter (HOSPITAL_COMMUNITY): Payer: Self-pay

## 2015-08-03 ENCOUNTER — Encounter (HOSPITAL_COMMUNITY)
Admission: RE | Admit: 2015-08-03 | Discharge: 2015-08-03 | Disposition: A | Discharge status: Home / Self Care | Payer: Medicare Other | Source: Ambulatory Visit | Attending: Urology | Admitting: Urology

## 2015-08-03 ENCOUNTER — Telehealth: Payer: Self-pay | Admitting: Internal Medicine

## 2015-08-03 ENCOUNTER — Ambulatory Visit (HOSPITAL_COMMUNITY)
Admission: RE | Admit: 2015-08-03 | Discharge: 2015-08-03 | Disposition: A | Discharge status: Home / Self Care | Payer: Medicare Other | Source: Ambulatory Visit | Attending: Urology | Admitting: Urology

## 2015-08-03 DIAGNOSIS — Z9889 Other specified postprocedural states: Secondary | ICD-10-CM | POA: Insufficient documentation

## 2015-08-03 DIAGNOSIS — N281 Cyst of kidney, acquired: Secondary | ICD-10-CM | POA: Insufficient documentation

## 2015-08-03 DIAGNOSIS — D49512 Neoplasm of unspecified behavior of left kidney: Secondary | ICD-10-CM | POA: Insufficient documentation

## 2015-08-03 DIAGNOSIS — N838 Other noninflammatory disorders of ovary, fallopian tube and broad ligament: Secondary | ICD-10-CM | POA: Insufficient documentation

## 2015-08-03 DIAGNOSIS — N2889 Other specified disorders of kidney and ureter: Secondary | ICD-10-CM

## 2015-08-03 DIAGNOSIS — K8020 Calculus of gallbladder without cholecystitis without obstruction: Secondary | ICD-10-CM

## 2015-08-03 LAB — CBC
HEMATOCRIT: 35.6 % — AB (ref 36.0–46.0)
Hemoglobin: 12.4 g/dL (ref 12.0–15.0)
MCH: 30.2 pg (ref 26.0–34.0)
MCHC: 34.8 g/dL (ref 30.0–36.0)
MCV: 86.6 fL (ref 78.0–100.0)
Platelets: 185 10*3/uL (ref 150–400)
RBC: 4.11 MIL/uL (ref 3.87–5.11)
RDW: 17.6 % — AB (ref 11.5–15.5)
WBC: 7.2 10*3/uL (ref 4.0–10.5)

## 2015-08-03 LAB — BASIC METABOLIC PANEL
ANION GAP: 8 (ref 5–15)
BUN: 10 mg/dL (ref 6–20)
CO2: 25 mmol/L (ref 22–32)
Calcium: 9.2 mg/dL (ref 8.9–10.3)
Chloride: 109 mmol/L (ref 101–111)
Creatinine, Ser: 0.6 mg/dL (ref 0.44–1.00)
GFR calc Af Amer: >60 mL/min (ref >60)
Glucose, Bld: 116 mg/dL — ABNORMAL HIGH (ref 65–99)
POTASSIUM: 3.9 mmol/L (ref 3.5–5.1)
SODIUM: 142 mmol/L (ref 135–145)

## 2015-08-03 MED ORDER — GADOBENATE DIMEGLUMINE 529 MG/ML IV SOLN
10.0000 mL | Freq: Once | INTRAVENOUS | Status: AC | PRN
  Administered 2015-08-03: 7 mL via INTRAVENOUS

## 2015-08-03 NOTE — Progress Notes (Signed)
06/07/2015-Noted in EPIC-EKG and CT abd./pelvis . 06/09/2015- noted in EPIC- 1 view Chest x-ray

## 2015-08-03 NOTE — Telephone Encounter (Signed)
pt having surgery 08-19-15 and needs to go off Warfarin 5 days prior-pls call [phone] pt to let her know

## 2015-08-04 ENCOUNTER — Ambulatory Visit (INDEPENDENT_AMBULATORY_CARE_PROVIDER_SITE_OTHER): Payer: Medicare Other | Admitting: Pharmacist Clinician (PhC)/ Clinical Pharmacy Specialist

## 2015-08-04 DIAGNOSIS — Z7901 Long term (current) use of anticoagulants: Secondary | ICD-10-CM

## 2015-08-04 DIAGNOSIS — I739 Peripheral vascular disease, unspecified: Secondary | ICD-10-CM

## 2015-08-04 LAB — POCT INR: INR: 4.3

## 2015-08-04 NOTE — Telephone Encounter (Signed)
Patient aware- see anticoag note

## 2015-08-05 ENCOUNTER — Inpatient Hospital Stay (HOSPITAL_COMMUNITY)
Admission: EM | Admit: 2015-08-05 | Discharge: 2015-08-13 | DRG: 270 | Disposition: A | Discharge status: Hospice | Payer: Medicare Other | Attending: Vascular Surgery | Admitting: Vascular Surgery

## 2015-08-05 ENCOUNTER — Encounter (HOSPITAL_COMMUNITY)
Admission: EM | Disposition: A | Discharge status: Hospice | Payer: Self-pay | Source: Home / Self Care | Attending: Surgery

## 2015-08-05 ENCOUNTER — Encounter: Payer: Self-pay | Admitting: Internal Medicine

## 2015-08-05 ENCOUNTER — Emergency Department (HOSPITAL_COMMUNITY): Payer: Medicare Other | Admitting: Anesthesiology

## 2015-08-05 ENCOUNTER — Encounter (HOSPITAL_COMMUNITY): Payer: Self-pay | Admitting: Emergency Medicine

## 2015-08-05 DIAGNOSIS — Z515 Encounter for palliative care: Secondary | ICD-10-CM | POA: Diagnosis not present

## 2015-08-05 DIAGNOSIS — G629 Polyneuropathy, unspecified: Secondary | ICD-10-CM | POA: Diagnosis present

## 2015-08-05 DIAGNOSIS — E43 Unspecified severe protein-calorie malnutrition: Secondary | ICD-10-CM | POA: Diagnosis present

## 2015-08-05 DIAGNOSIS — I97638 Postprocedural hematoma of a circulatory system organ or structure following other circulatory system procedure: Secondary | ICD-10-CM | POA: Diagnosis not present

## 2015-08-05 DIAGNOSIS — Z978 Presence of other specified devices: Secondary | ICD-10-CM

## 2015-08-05 DIAGNOSIS — D638 Anemia in other chronic diseases classified elsewhere: Secondary | ICD-10-CM | POA: Diagnosis present

## 2015-08-05 DIAGNOSIS — Z87891 Personal history of nicotine dependence: Secondary | ICD-10-CM | POA: Diagnosis not present

## 2015-08-05 DIAGNOSIS — M797 Fibromyalgia: Secondary | ICD-10-CM | POA: Diagnosis present

## 2015-08-05 DIAGNOSIS — F329 Major depressive disorder, single episode, unspecified: Secondary | ICD-10-CM | POA: Diagnosis present

## 2015-08-05 DIAGNOSIS — K219 Gastro-esophageal reflux disease without esophagitis: Secondary | ICD-10-CM | POA: Diagnosis present

## 2015-08-05 DIAGNOSIS — L97519 Non-pressure chronic ulcer of other part of right foot with unspecified severity: Secondary | ICD-10-CM | POA: Diagnosis present

## 2015-08-05 DIAGNOSIS — Z9842 Cataract extraction status, left eye: Secondary | ICD-10-CM | POA: Diagnosis not present

## 2015-08-05 DIAGNOSIS — Z86711 Personal history of pulmonary embolism: Secondary | ICD-10-CM

## 2015-08-05 DIAGNOSIS — F419 Anxiety disorder, unspecified: Secondary | ICD-10-CM | POA: Diagnosis present

## 2015-08-05 DIAGNOSIS — J449 Chronic obstructive pulmonary disease, unspecified: Secondary | ICD-10-CM

## 2015-08-05 DIAGNOSIS — M810 Age-related osteoporosis without current pathological fracture: Secondary | ICD-10-CM | POA: Diagnosis present

## 2015-08-05 DIAGNOSIS — Z9841 Cataract extraction status, right eye: Secondary | ICD-10-CM | POA: Diagnosis not present

## 2015-08-05 DIAGNOSIS — J95821 Acute postprocedural respiratory failure: Secondary | ICD-10-CM | POA: Diagnosis not present

## 2015-08-05 DIAGNOSIS — Z419 Encounter for procedure for purposes other than remedying health state, unspecified: Secondary | ICD-10-CM

## 2015-08-05 DIAGNOSIS — M79605 Pain in left leg: Secondary | ICD-10-CM | POA: Diagnosis not present

## 2015-08-05 DIAGNOSIS — Z66 Do not resuscitate: Secondary | ICD-10-CM | POA: Diagnosis present

## 2015-08-05 DIAGNOSIS — Z981 Arthrodesis status: Secondary | ICD-10-CM

## 2015-08-05 DIAGNOSIS — Z7901 Long term (current) use of anticoagulants: Secondary | ICD-10-CM

## 2015-08-05 DIAGNOSIS — J988 Other specified respiratory disorders: Secondary | ICD-10-CM | POA: Diagnosis not present

## 2015-08-05 DIAGNOSIS — M1990 Unspecified osteoarthritis, unspecified site: Secondary | ICD-10-CM | POA: Diagnosis present

## 2015-08-05 DIAGNOSIS — Z888 Allergy status to other drugs, medicaments and biological substances status: Secondary | ICD-10-CM | POA: Diagnosis not present

## 2015-08-05 DIAGNOSIS — Y848 Other medical procedures as the cause of abnormal reaction of the patient, or of later complication, without mention of misadventure at the time of the procedure: Secondary | ICD-10-CM | POA: Diagnosis not present

## 2015-08-05 DIAGNOSIS — I998 Other disorder of circulatory system: Secondary | ICD-10-CM

## 2015-08-05 DIAGNOSIS — Z681 Body mass index (BMI) 19 or less, adult: Secondary | ICD-10-CM

## 2015-08-05 DIAGNOSIS — T82898A Other specified complication of vascular prosthetic devices, implants and grafts, initial encounter: Secondary | ICD-10-CM | POA: Diagnosis present

## 2015-08-05 DIAGNOSIS — M6282 Rhabdomyolysis: Secondary | ICD-10-CM | POA: Diagnosis not present

## 2015-08-05 DIAGNOSIS — Z79899 Other long term (current) drug therapy: Secondary | ICD-10-CM | POA: Diagnosis not present

## 2015-08-05 DIAGNOSIS — Y832 Surgical operation with anastomosis, bypass or graft as the cause of abnormal reaction of the patient, or of later complication, without mention of misadventure at the time of the procedure: Secondary | ICD-10-CM | POA: Diagnosis not present

## 2015-08-05 DIAGNOSIS — R739 Hyperglycemia, unspecified: Secondary | ICD-10-CM | POA: Diagnosis present

## 2015-08-05 DIAGNOSIS — T82868A Thrombosis of vascular prosthetic devices, implants and grafts, initial encounter: Principal | ICD-10-CM | POA: Diagnosis present

## 2015-08-05 DIAGNOSIS — I739 Peripheral vascular disease, unspecified: Secondary | ICD-10-CM | POA: Diagnosis present

## 2015-08-05 DIAGNOSIS — Z9911 Dependence on respirator [ventilator] status: Secondary | ICD-10-CM | POA: Diagnosis not present

## 2015-08-05 HISTORY — PX: AXILLARY-FEMORAL BYPASS GRAFT: SHX894

## 2015-08-05 LAB — CBC WITH DIFFERENTIAL/PLATELET
BASOS ABS: 0 10*3/uL (ref 0.0–0.1)
BASOS PCT: 0 %
EOS ABS: 0 10*3/uL (ref 0.0–0.7)
EOS PCT: 0 %
HEMATOCRIT: 40.2 % (ref 36.0–46.0)
Hemoglobin: 13.9 g/dL (ref 12.0–15.0)
LYMPHS ABS: 1 10*3/uL (ref 0.7–4.0)
LYMPHS PCT: 9 %
MCH: 29.8 pg (ref 26.0–34.0)
MCHC: 34.6 g/dL (ref 30.0–36.0)
MCV: 86.1 fL (ref 78.0–100.0)
MONO ABS: 0.5 10*3/uL (ref 0.1–1.0)
Monocytes Relative: 4 %
NEUTROS PCT: 87 %
Neutro Abs: 9.9 10*3/uL — ABNORMAL HIGH (ref 1.7–7.7)
PLATELETS: 156 10*3/uL (ref 150–400)
RBC: 4.67 MIL/uL (ref 3.87–5.11)
RDW: 17.5 % — AB (ref 11.5–15.5)
WBC: 11.4 10*3/uL — ABNORMAL HIGH (ref 4.0–10.5)

## 2015-08-05 LAB — URINALYSIS, ROUTINE W REFLEX MICROSCOPIC
Bilirubin Urine: NEGATIVE
GLUCOSE, UA: 100 mg/dL — AB
Ketones, ur: NEGATIVE mg/dL
LEUKOCYTES UA: NEGATIVE
Nitrite: NEGATIVE
Protein, ur: 100 mg/dL — AB
SPECIFIC GRAVITY, URINE: 1.01 (ref 1.005–1.030)
pH: 7 (ref 5.0–8.0)

## 2015-08-05 LAB — COMPREHENSIVE METABOLIC PANEL
ALBUMIN: 4.3 g/dL (ref 3.5–5.0)
ALT: 23 U/L (ref 14–54)
AST: 34 U/L (ref 15–41)
Alkaline Phosphatase: 97 U/L (ref 38–126)
Anion gap: 14 (ref 5–15)
BUN: 9 mg/dL (ref 6–20)
CHLORIDE: 106 mmol/L (ref 101–111)
CO2: 24 mmol/L (ref 22–32)
Calcium: 9.3 mg/dL (ref 8.9–10.3)
Creatinine, Ser: 0.61 mg/dL (ref 0.44–1.00)
GFR calc Af Amer: >60 mL/min (ref >60)
GFR calc non Af Amer: >60 mL/min (ref >60)
GLUCOSE: 142 mg/dL — AB (ref 65–99)
POTASSIUM: 3.1 mmol/L — AB (ref 3.5–5.1)
SODIUM: 144 mmol/L (ref 135–145)
Total Bilirubin: 0.6 mg/dL (ref 0.3–1.2)
Total Protein: 8.4 g/dL — ABNORMAL HIGH (ref 6.5–8.1)

## 2015-08-05 LAB — URINE MICROSCOPIC-ADD ON

## 2015-08-05 LAB — PROTIME-INR
INR: 2.06 — ABNORMAL HIGH (ref 0.00–1.49)
Prothrombin Time: 23.1 seconds — ABNORMAL HIGH (ref 11.6–15.2)

## 2015-08-05 SURGERY — CREATION, BYPASS, ARTERIAL, AXILLARY TO BILATERAL FEMORAL, USING GRAFT
Anesthesia: General | Laterality: Left

## 2015-08-05 MED ORDER — MIDAZOLAM HCL 2 MG/2ML IJ SOLN
INTRAMUSCULAR | Status: AC
  Filled 2015-08-05: qty 2

## 2015-08-05 MED ORDER — HEPARIN SODIUM (PORCINE) 5000 UNIT/ML IJ SOLN
INTRAMUSCULAR | Status: DC | PRN
  Administered 2015-08-05: 500 mL

## 2015-08-05 MED ORDER — 0.9 % SODIUM CHLORIDE (POUR BTL) OPTIME
TOPICAL | Status: DC | PRN
  Administered 2015-08-05: 2000 mL

## 2015-08-05 MED ORDER — ONDANSETRON HCL 4 MG/2ML IJ SOLN
INTRAMUSCULAR | Status: AC
  Filled 2015-08-05: qty 2

## 2015-08-05 MED ORDER — LIDOCAINE HCL (CARDIAC) 20 MG/ML IV SOLN
INTRAVENOUS | Status: DC | PRN
  Administered 2015-08-05: 50 mg via INTRAVENOUS

## 2015-08-05 MED ORDER — ROCURONIUM BROMIDE 100 MG/10ML IV SOLN
INTRAVENOUS | Status: DC | PRN
  Administered 2015-08-05 (×2): 25 mg via INTRAVENOUS
  Administered 2015-08-05: 50 mg via INTRAVENOUS

## 2015-08-05 MED ORDER — ONDANSETRON HCL 4 MG/2ML IJ SOLN
INTRAMUSCULAR | Status: DC | PRN
  Administered 2015-08-05: 4 mg via INTRAVENOUS

## 2015-08-05 MED ORDER — SODIUM CHLORIDE 0.9 % IV SOLN
1000.0000 mg | INTRAVENOUS | Status: DC | PRN
  Administered 2015-08-05: 500 mg via INTRAVENOUS

## 2015-08-05 MED ORDER — MIDAZOLAM HCL 5 MG/5ML IJ SOLN
INTRAMUSCULAR | Status: DC | PRN
  Administered 2015-08-05: 2 mg via INTRAVENOUS

## 2015-08-05 MED ORDER — ONDANSETRON HCL 4 MG/2ML IJ SOLN
4.0000 mg | Freq: Once | INTRAMUSCULAR | Status: AC
  Administered 2015-08-05: 4 mg via INTRAVENOUS
  Filled 2015-08-05: qty 2

## 2015-08-05 MED ORDER — HEPARIN (PORCINE) IN NACL 100-0.45 UNIT/ML-% IJ SOLN
1000.0000 [IU]/h | INTRAMUSCULAR | Status: DC
  Administered 2015-08-05: 1000 [IU]/h via INTRAVENOUS
  Filled 2015-08-05 (×2): qty 250

## 2015-08-05 MED ORDER — LACTATED RINGERS IV SOLN
INTRAVENOUS | Status: DC | PRN
  Administered 2015-08-05 (×2): via INTRAVENOUS

## 2015-08-05 MED ORDER — SODIUM CHLORIDE 0.9 % IJ SOLN
INTRAMUSCULAR | Status: AC
  Filled 2015-08-05: qty 10

## 2015-08-05 MED ORDER — SUCCINYLCHOLINE CHLORIDE 20 MG/ML IJ SOLN
INTRAMUSCULAR | Status: AC
  Filled 2015-08-05: qty 1

## 2015-08-05 MED ORDER — HEPARIN SODIUM (PORCINE) 1000 UNIT/ML IJ SOLN
INTRAMUSCULAR | Status: DC | PRN
  Administered 2015-08-05: 3000 [IU] via INTRAVENOUS

## 2015-08-05 MED ORDER — PHENYLEPHRINE HCL 10 MG/ML IJ SOLN
10.0000 mg | INTRAVENOUS | Status: DC | PRN
  Administered 2015-08-05: 10 ug/min via INTRAVENOUS

## 2015-08-05 MED ORDER — HEPARIN SODIUM (PORCINE) 1000 UNIT/ML IJ SOLN
INTRAMUSCULAR | Status: AC
  Filled 2015-08-05: qty 1

## 2015-08-05 MED ORDER — IOPAMIDOL (ISOVUE-300) INJECTION 61%
INTRAVENOUS | Status: AC
  Filled 2015-08-05: qty 50

## 2015-08-05 MED ORDER — PROTAMINE SULFATE 10 MG/ML IV SOLN
INTRAVENOUS | Status: DC | PRN
  Administered 2015-08-05: 20 mg via INTRAVENOUS
  Administered 2015-08-05: 10 mg via INTRAVENOUS
  Administered 2015-08-05: 20 mg via INTRAVENOUS

## 2015-08-05 MED ORDER — ROCURONIUM BROMIDE 50 MG/5ML IV SOLN
INTRAVENOUS | Status: AC
Start: 2015-08-05 — End: 2015-08-05
  Filled 2015-08-05: qty 1

## 2015-08-05 MED ORDER — PROPOFOL 10 MG/ML IV BOLUS
INTRAVENOUS | Status: DC | PRN
  Administered 2015-08-05: 100 mg via INTRAVENOUS

## 2015-08-05 MED ORDER — PROPOFOL 10 MG/ML IV BOLUS
INTRAVENOUS | Status: AC
  Filled 2015-08-05: qty 20

## 2015-08-05 MED ORDER — HYDROMORPHONE HCL 1 MG/ML IJ SOLN
1.0000 mg | Freq: Once | INTRAMUSCULAR | Status: AC
  Administered 2015-08-05: 1 mg via INTRAVENOUS
  Filled 2015-08-05: qty 1

## 2015-08-05 MED ORDER — PROPOFOL 1000 MG/100ML IV EMUL
5.0000 ug/kg/min | INTRAVENOUS | Status: DC
  Administered 2015-08-06: 50 ug/kg/min via INTRAVENOUS
  Filled 2015-08-05 (×3): qty 100

## 2015-08-05 MED ORDER — ROCURONIUM BROMIDE 50 MG/5ML IV SOLN
INTRAVENOUS | Status: AC
  Filled 2015-08-05: qty 1

## 2015-08-05 MED ORDER — HEMOSTATIC AGENTS (NO CHARGE) OPTIME
TOPICAL | Status: DC | PRN
  Administered 2015-08-05: 1 via TOPICAL

## 2015-08-05 MED ORDER — HEPARIN BOLUS VIA INFUSION
4000.0000 [IU] | Freq: Once | INTRAVENOUS | Status: AC
  Administered 2015-08-05: 4000 [IU] via INTRAVENOUS
  Filled 2015-08-05: qty 4000

## 2015-08-05 MED ORDER — SUFENTANIL CITRATE 50 MCG/ML IV SOLN
INTRAVENOUS | Status: AC
  Filled 2015-08-05: qty 1

## 2015-08-05 MED ORDER — SUFENTANIL CITRATE 50 MCG/ML IV SOLN
INTRAVENOUS | Status: DC | PRN
  Administered 2015-08-05: 5 ug via INTRAVENOUS
  Administered 2015-08-05: 10 ug via INTRAVENOUS
  Administered 2015-08-05: 5 ug via INTRAVENOUS
  Administered 2015-08-05: 10 ug via INTRAVENOUS

## 2015-08-05 MED ORDER — VANCOMYCIN HCL IN DEXTROSE 1-5 GM/200ML-% IV SOLN
INTRAVENOUS | Status: AC
  Filled 2015-08-05: qty 200

## 2015-08-05 MED ORDER — SUCCINYLCHOLINE CHLORIDE 20 MG/ML IJ SOLN
INTRAMUSCULAR | Status: DC | PRN
  Administered 2015-08-05: 100 mg via INTRAVENOUS

## 2015-08-05 SURGICAL SUPPLY — 61 items
CANISTER SUCTION 2500CC (MISCELLANEOUS) ×3 IMPLANT
CATH EMB 3FR 40CM (CATHETERS) ×3 IMPLANT
CATH EMB 4FR 80CM (CATHETERS) ×3 IMPLANT
CLIP TI MEDIUM 24 (CLIP) ×3 IMPLANT
CLIP TI WIDE RED SMALL 24 (CLIP) ×3 IMPLANT
DRAIN SNY 10X20 3/4 PERF (WOUND CARE) IMPLANT
DRAPE INCISE IOBAN 66X45 STRL (DRAPES) IMPLANT
DRAPE X-RAY CASS 24X20 (DRAPES) IMPLANT
DRSG COVADERM 4X10 (GAUZE/BANDAGES/DRESSINGS) IMPLANT
DRSG COVADERM 4X8 (GAUZE/BANDAGES/DRESSINGS) ×3 IMPLANT
ELECT REM PT RETURN 9FT ADLT (ELECTROSURGICAL) ×3
ELECTRODE REM PT RTRN 9FT ADLT (ELECTROSURGICAL) ×1 IMPLANT
EVACUATOR SILICONE 100CC (DRAIN) IMPLANT
FELT TEFLON 1X6 (MISCELLANEOUS) ×3 IMPLANT
GAUZE SPONGE 4X4 16PLY XRAY LF (GAUZE/BANDAGES/DRESSINGS) IMPLANT
GLOVE BIOGEL PI IND STRL 6.5 (GLOVE) ×3 IMPLANT
GLOVE BIOGEL PI IND STRL 7.0 (GLOVE) ×2 IMPLANT
GLOVE BIOGEL PI IND STRL 7.5 (GLOVE) ×1 IMPLANT
GLOVE BIOGEL PI INDICATOR 6.5 (GLOVE) ×6
GLOVE BIOGEL PI INDICATOR 7.0 (GLOVE) ×4
GLOVE BIOGEL PI INDICATOR 7.5 (GLOVE) ×2
GLOVE SS BIOGEL STRL SZ 6 (GLOVE) ×2 IMPLANT
GLOVE SS BIOGEL STRL SZ 6.5 (GLOVE) ×1 IMPLANT
GLOVE SUPERSENSE BIOGEL SZ 6 (GLOVE) ×4
GLOVE SUPERSENSE BIOGEL SZ 6.5 (GLOVE) ×2
GLOVE SURG SS PI 7.5 STRL IVOR (GLOVE) ×3 IMPLANT
GOWN STRL REUS W/ TWL LRG LVL3 (GOWN DISPOSABLE) ×2 IMPLANT
GOWN STRL REUS W/ TWL XL LVL3 (GOWN DISPOSABLE) ×1 IMPLANT
GOWN STRL REUS W/TWL LRG LVL3 (GOWN DISPOSABLE) ×4
GOWN STRL REUS W/TWL XL LVL3 (GOWN DISPOSABLE) ×2
GRAFT GORETEX STRETCH 8MX10CM (Vascular Products) ×3 IMPLANT
HEMOSTAT SNOW SURGICEL 2X4 (HEMOSTASIS) IMPLANT
INSERT FOGARTY SM (MISCELLANEOUS) IMPLANT
KIT BASIN OR (CUSTOM PROCEDURE TRAY) ×3 IMPLANT
KIT ROOM TURNOVER OR (KITS) ×3 IMPLANT
LIQUID BAND (GAUZE/BANDAGES/DRESSINGS) ×3 IMPLANT
NS IRRIG 1000ML POUR BTL (IV SOLUTION) ×6 IMPLANT
PACK PERIPHERAL VASCULAR (CUSTOM PROCEDURE TRAY) ×3 IMPLANT
PAD ARMBOARD 7.5X6 YLW CONV (MISCELLANEOUS) ×6 IMPLANT
SET COLLECT BLD 21X3/4 12 (NEEDLE) IMPLANT
STAPLER VISISTAT 35W (STAPLE) ×3 IMPLANT
STOPCOCK 4 WAY LG BORE MALE ST (IV SETS) IMPLANT
SUT GORETEX 5 0 TT13 24 (SUTURE) IMPLANT
SUT GORETEX 6.0 TT9 (SUTURE) ×15 IMPLANT
SUT PROLENE 5 0 C 1 36 (SUTURE) ×3 IMPLANT
SUT PROLENE 6 0 BV (SUTURE) ×3 IMPLANT
SUT PROLENE 6 0 CC (SUTURE) IMPLANT
SUT SILK 2 0 SH (SUTURE) IMPLANT
SUT VIC AB 2-0 CT1 27 (SUTURE) ×4
SUT VIC AB 2-0 CT1 TAPERPNT 27 (SUTURE) ×2 IMPLANT
SUT VIC AB 3-0 SH 27 (SUTURE) ×4
SUT VIC AB 3-0 SH 27X BRD (SUTURE) ×2 IMPLANT
SUT VICRYL 4-0 PS2 18IN ABS (SUTURE) ×6 IMPLANT
SWAB COLLECTION DEVICE MRSA (MISCELLANEOUS) ×3 IMPLANT
SYR 3ML LL SCALE MARK (SYRINGE) ×6 IMPLANT
SYR TB 1ML LUER SLIP (SYRINGE) ×3 IMPLANT
TAPE UMBILICAL COTTON 1/8X30 (MISCELLANEOUS) IMPLANT
TRAY FOLEY W/METER SILVER 16FR (SET/KITS/TRAYS/PACK) IMPLANT
TUBE ANAEROBIC SPECIMEN COL (MISCELLANEOUS) ×3 IMPLANT
TUBING EXTENTION W/L.L. (IV SETS) IMPLANT
WATER STERILE IRR 1000ML POUR (IV SOLUTION) ×3 IMPLANT

## 2015-08-05 NOTE — Anesthesia Procedure Notes (Signed)
Procedure Name: Intubation Date/Time: 08/05/2015 9:41 PM Performed by: Mosie Epstein Pre-anesthesia Checklist: Emergency Drugs available, Patient identified, Timeout performed, Suction available and Patient being monitored Patient Re-evaluated:Patient Re-evaluated prior to inductionOxygen Delivery Method: Circle system utilized Preoxygenation: Pre-oxygenation with 100% oxygen Intubation Type: IV induction Ventilation: Mask ventilation without difficulty Laryngoscope Size: Mac and 3 Grade View: Grade I Tube type: Oral Tube size: 7.5 mm Number of attempts: 1 Airway Equipment and Method: Stylet Placement Confirmation: ETT inserted through vocal cords under direct vision,  positive ETCO2 and breath sounds checked- equal and bilateral Secured at: 22 cm Tube secured with: Tape Dental Injury: Teeth and Oropharynx as per pre-operative assessment

## 2015-08-05 NOTE — H&P (Signed)
Consult Note  Patient name: Cynthia Bowen MRN: [id number] DOB: April 09, 1946 Sex: female She denies numbness or tingling in the leg.  The patient has a history of COPD.  She is a former smoker Consulting Physician:  Dr. Maryan Rued  Reason for Consult:  Chief Complaint  Patient presents with  . Leg Pain    HISTORY OF PRESENT ILLNESS: This is a 70 year old female who is a patient of Dr. Bridgett Larsson.  She has had the following procedures: 1. 5/5/16Aortic stenting with endograft, PTA+S L CIA 2. 5/15/16R EIA and CFA EA w/ Dacron patch angioplasty, R to L fem-fem BPG 3. 12/30/14 I&D B groin abscesses 4. 9/9/16Exc infected fem-fem BPG, B CFA BPA, L ax-PFA BPG 5. 9/20/16Drainage L axilla seroma  She also has a chronic right foot ulcer and has been scheduled for a right femoral to below-knee popliteal artery bypass graft later this month.  Unfortunately around 5:00 this evening she began complaining of pain in her left groin and left leg.  She came straight to the emergency department.  Of note she is on Coumadin and her last INR was 4 and therefore her Coumadin has been on hold.  Her INR was 2.0 in the emergency department.  The patient suffers from COPD.  She is a former smoker.  In February of this year she underwent exploratory laparotomy for small bowel obstruction.  She is found to have intussusception and underwent resection.  Past Medical History  Diagnosis Date  . COPD (chronic obstructive pulmonary disease) ([place])   . Pneumonia 12-2011  . GERD (gastroesophageal reflux disease)   . Headache(784.0)   . Arthritis     osteoarthritis  . Allergy   . Depression   . Neuromuscular disorder ([place])   . Osteoporosis   . Bronchitis     CURRENTLY AS OF 06/30/12 - HAS COUGH AND FINISHED ANTIBIOTIC FOR BRONCHITIS  . Fibromyalgia   . Pain     ABDOMINAL PAIN AND NAUSEA  . Pain     SOMETIMES PAIN RIGHT EAR AND NECK--STATES CAUSED BY A "LUMP" ON BACK OF  EAR--USES KENALOG CREAM TOPICALLY AS NEEDED.  Marland Kitchen Gastrocutaneous fistula   . Anemia   . Anxiety   . Blood transfusion without reported diagnosis   . Heart murmur     young  . Peripheral vascular disease (HCC)     hx  ?leg  . History of kidney stones   . Pulmonary embolism [place])     Past Surgical History  Procedure Laterality Date  . Abdominal hysterectomy    . Esophagogastroduodenoscopy  04/18/2012    Procedure: ESOPHAGOGASTRODUODENOSCOPY (EGD);  Surgeon: Inda Castle, MD;  Location: [facility];  Service: Endoscopy;  Laterality: N/A;  . Eus  05/29/2012    Procedure: UPPER ENDOSCOPIC ULTRASOUND (EUS) LINEAR;  Surgeon: Milus Banister, MD;  Location: [facility];  Service: Endoscopy;  Laterality: N/A;  . Appendectomy    . Spine surgery      CERVICAL SPINE SURGERY X 2 - INCLUDING FUSION; LUMBAR SURGERY FOR RUPTURED DISC  . Eye surgery      BILATERAL CATARACT EXTRACTIONS  . Laparoscopic abdominal exploration N/A 07/02/2012    Procedure: converted to laparotomy with gastric biopsy;  Surgeon: Imogene Burn. Georgette Dover, MD;  Location: [facility];  Service: General;  Laterality: N/A;  Laparoscopic Gastric Biospy attempted.   . Laparotomy N/A 07/07/2012    Procedure: EXPLORATORY LAPAROTOMY repair of gastric perforation with omental graham patch, drainage of abdominal abcess;  Surgeon: Imogene Burn. Georgette Dover, MD;  Location: [facility];  Service: General;  Laterality: N/A;  . Stomach surgery  07/07/2012    Omental patch of gastric perforation  . Laparotomy N/A 07/13/2012    Procedure: EXPLORATORY LAPAROTOMY repair gastric leak;  Surgeon: Edward Jolly, MD;  Location: [facility];  Service: General;  Laterality: N/A;  . Laparotomy N/A 11/11/2012    Procedure: EXPLORATORY LAPAROTOMY;  Surgeon: Imogene Burn. Georgette Dover, MD;  Location: [place];  Service: General;  Laterality: N/A;  . Bowel resection N/A 11/11/2012    Procedure: SMALL BOWEL RESECTION;  Surgeon: Imogene Burn. Georgette Dover, MD;  Location: [place];  Service: General;   Laterality: N/A;  . Minor application of wound vac N/A 11/11/2012    Procedure: APPLICATION OF WOUND VAC;  Surgeon: Imogene Burn. Georgette Dover, MD;  Location: [place];  Service: General;  Laterality: N/A;  . Jejunostomy N/A 11/11/2012    Procedure: PLACEMENT OF FEEDING JEJUNOSTOMY TUBE;  Surgeon: Imogene Burn. Georgette Dover, MD;  Location: [place];  Service: General;  Laterality: N/A;  . Lysis of adhesion N/A 11/11/2012    Procedure: LYSIS OF ADHESION;  Surgeon: Imogene Burn. Georgette Dover, MD;  Location: [place];  Service: General;  Laterality: N/A;  . Hernia repair    . Gastric fistula repair      05/16/2013  . Colonoscopy    . Polypectomy    . Upper gastrointestinal endoscopy    . Abdominal aortagram N/A 08/19/2014    Procedure: ABDOMINAL Maxcine Ham;  Surgeon: Conrad Franklin, MD;  Location: [facility];  Service: Cardiovascular;  Laterality: N/A;  . Abdominal aortic endovascular stent graft N/A 08/26/2014    Procedure: AORTIC ENDOVASCULAR  COVERED STENT ;  Surgeon: Conrad [place], MD;  Location: [place];  Service: Vascular;  Laterality: N/A;  IVUS  . Femoral-femoral bypass graft Bilateral 09/04/2014    Procedure: Right External Iliac and right comon femoral endarterectomy with patching and right to left BYPASS GRAFT FEMORAL-FEMORAL ARTERY, ;  Surgeon: Rosetta Posner, MD;  Location: [place];  Service: Vascular;  Laterality: Bilateral;  . Wound exploration Left 12/30/2014    Procedure: WOUND EXPLORATION;  Surgeon: Conrad Hanover, MD;  Location: [place];  Service: Vascular;  Laterality: Left;  . Irrigation and debridement abscess Bilateral 12/30/2014    Procedure: IRRIGATION AND DEBRIDEMENT of bilateral groin ABSCESSes;  Surgeon: Conrad [place], MD;  Location: [place];  Service: Vascular;  Laterality: Bilateral;  . Application of wound vac Right 12/30/2014    Procedure: APPLICATION OF WOUND VAC to right groin;  Surgeon: Conrad [place], MD;  Location: [place];  Service: Vascular;  Laterality: Right;  . Femoral-femoral bypass graft Bilateral 12/31/2014    Procedure:  REMOVAL INFECTED RIGHT FEMORAL ARTERY TO LEFT FEMORAL ARTERY BYPASS GRAFT;  Surgeon: Conrad Lacombe, MD;  Location: [place];  Service: Vascular;  Laterality: Bilateral;  . Axillary-femoral bypass graft Left 12/31/2014    Procedure: LEFT  AXILLA RY ARTERY TO LEFT FEMORAL ARTERY BYPASS GRAFT;  Surgeon: Conrad Livingston, MD;  Location: [place];  Service: Vascular;  Laterality: Left;  . Patch angioplasty Right 12/31/2014    Procedure: RIGHT FEMORAL ARTERY PATCH ANGIOPLASTY USING Rueben Bash BIOLOGIC PATCH;  Surgeon: Conrad Lodge, MD;  Location: [place];  Service: Vascular;  Laterality: Right;  . Intraoperative arteriogram Left 12/31/2014    Procedure: INTRA OPERATIVE ARTERIOGRAM;  Surgeon: Conrad , MD;  Location: [facility];  Service: Vascular;  Laterality: Left;  . Wound exploration Left 01/11/2015  Procedure: LEFT AXILLARY WOUND EXPLORATION AND DRAINAGE OF SEROMA;  Surgeon: Conrad [place], MD;  Location: [place];  Service: Vascular;  Laterality: Left;  . Application of wound vac Left 01/11/2015    Procedure: APPLICATION OF NEGATIVE PRESSURE WOUND DRESSING TO LEFT AXILLA  AND LEFT GROIN;  Surgeon: Conrad Wooster, MD;  Location: [place];  Service: Vascular;  Laterality: Left;  . Ovarian cyst removal    . Laparotomy N/A 06/07/2015    Procedure: EXPLORATORY LAPAROTOMY, LYSIS OF ADHESIONS,  WITH BOWEL RESECTION OFR SMALL BOWEL INTUSSUSCEPTION;  Surgeon: Arta Bruce Kinsinger, MD;  Location: [facility];  Service: General;  Laterality: N/A;    Social History   Social History  . Marital Status: Single    Spouse Name: N/A  . Number of Children: 0  . Years of Education: N/A   Occupational History  .     Social History Main Topics  . Smoking status: Former Smoker -- 0.50 packs/day for 35 years    Types: Cigarettes    Start date: 01/04/2003    Quit date: 04/17/2005  . Smokeless tobacco: Never Used  . Alcohol Use: No  . Drug Use: No  . Sexual Activity: No   Other Topics Concern  . Not on file   Social History Narrative    Single. Education: [employer]. Exercise.    Family History  Problem Relation Age of Onset  . COPD Mother   . Hyperlipidemia Mother   . Hypertension Maternal Grandmother   . Stroke Maternal Grandmother   . Colon cancer Neg Hx     Allergies as of 08/05/2015 - Review Complete 08/05/2015  Allergen Reaction Noted  . Avelox [moxifloxacin hcl in nacl] Nausea And Vomiting 01/04/2012  . Betadine [povidone iodine] Itching and Rash 09/07/2012  . Alendronate sodium Nausea And Vomiting and Other (See Comments) 06/12/2011  . Aspirin Nausea Only 06/12/2011  . Codeine Nausea And Vomiting 06/12/2011  . Doxycycline Nausea And Vomiting 06/12/2011  . Fluconazole Nausea And Vomiting 06/12/2011  . Hydrocodone Nausea And Vomiting 05/24/2012  . Hydrocodone-acetaminophen Nausea And Vomiting 01/15/2014  . Morphine and related Nausea Only 09/04/2014  . Neurontin [gabapentin] Other (See Comments) 06/12/2011  . Nsaids Other (See Comments) 07/11/2012  . Quinolones Hives and Itching 01/15/2014  . Sertraline hcl Nausea And Vomiting and Other (See Comments) 06/12/2011  . Sulfamethoxazole Hives and Itching 01/15/2014  . Latex Rash 06/13/2013  . Sulfa antibiotics Rash 06/12/2011    No current facility-administered medications on file prior to encounter.   Current Outpatient Prescriptions on File Prior to Encounter  Medication Sig Dispense Refill  . acetaminophen (TYLENOL) 500 MG tablet Take 1 tablet (500 mg total) by mouth every 8 (eight) hours as needed for mild pain or moderate pain. Reported on 05/20/2015 30 tablet 0  . calcium gluconate 500 MG tablet Take 1 tablet by mouth 3 (three) times daily.    Marland Kitchen dicyclomine (BENTYL) 10 MG capsule TAKE TWO CAPSULES (20MG ) 4 TIMES A DAY (BEFORE MEALS AND AT BEDTIME) 240 capsule 0  . feeding supplement, RESOURCE BREEZE, (RESOURCE BREEZE) LIQD Take 1 Container by mouth 3 (three) times daily between meals.    Marland Kitchen ketotifen (ZADITOR) 0.025 % ophthalmic solution Place 1 drop into  both eyes 2 (two) times daily.    . Multiple Vitamin (MULTIVITAMIN WITH MINERALS) TABS tablet Take 1 tablet by mouth daily.    Marland Kitchen oxyCODONE-acetaminophen (PERCOCET/ROXICET) 5-325 MG tablet Take 1-2 tablets by mouth every 4 (four) hours as needed for moderate pain. [place]  tablet 0  . pregabalin (LYRICA) 50 MG capsule Take 1 capsule (50 mg total) by mouth 3 (three) times daily. Reported on 07/12/2015 90 capsule 5  . Probiotic Product (PROBIOTIC DAILY PO) Take 1 capsule by mouth daily.    . ranitidine (ZANTAC) 150 MG tablet Take 1 tablet (150 mg total) by mouth 2 (two) times daily. 180 tablet 3  . vitamin C (ASCORBIC ACID) 500 MG tablet Take 500 mg by mouth daily. Reported on 07/12/2015    . Vitamin D, Ergocalciferol, (DRISDOL) 50000 units CAPS capsule Take 1 capsule (50,000 Units total) by mouth every 7 (seven) days. 4 capsule 0  . zinc gluconate 50 MG tablet Take 50 mg by mouth daily.    Marland Kitchen docusate sodium (COLACE) 100 MG capsule Take 1 capsule (100 mg total) by mouth 2 (two) times daily. 10 capsule 0  . fluocinonide cream (LIDEX) [id number] % Apply 1 application topically 2 (two) times daily as needed. Reported on 05/05/2015    . mupirocin ointment (BACTROBAN) 2 % Place 1 application into the nose 2 (two) times daily. (Patient not taking: Reported on 08/02/2015) 30 g 3  . ondansetron (ZOFRAN) 4 MG tablet Take 4 mg by mouth every 8 (eight) hours as needed for nausea or vomiting.    . polyethylene glycol powder (GLYCOLAX/MIRALAX) powder MIX 17 G (1 CAPFUL) IN 8 OZ OF LIQUID 2 TIMES A DAY AS NEEDED (Patient taking differently: MIX 17 G (1 CAPFUL) IN 8 OZ OF LIQUID 2 TIMES A DAY AS NEEDED FOR CONSTIPATION.) 527 g 1  . warfarin (COUMADIN) 5 MG tablet Take one tablet by mouth daily except 1/2 tablet on Mon. & Fri. Or as directed by the Coumad[facility] Clinic (Patient taking differently: Take 2.5-5 mg by mouth daily. Take 1 tablet (5 mg) by mouth daily on Sun, Tues, Thurs and Sat and Take 0.5 tablet (2.5 mg) on Mon, Wed and Fri.)  90 tablet 0     REVIEW OF SYSTEMS: Cardiovascular: Pain in left leg Pulmonary: No productive cough, asthma or wheezing. Neurologic: No weakness, paresthesias, aphasia, or amaurosis. No dizziness. Hematologic: No bleeding problems or clotting disorders. Musculoskeletal: No joint pain or joint swelling. Gastrointestinal: No blood in stool or hematemesis Genitourinary: No dysuria or hematuria. Psychiatric:: No history of major depression. Integumentary: No rashes or ulcers. Constitutional: No fever or chills.  PHYSICAL EXAMINATION: General: The patient appears their stated age.  Vital signs are BP 186/89 mmHg  Pulse 72  Temp(Src) 98.5 F (36.9 C) (Oral)  Resp 17  SpO2 97% Pulmonary: Respirations are non-labored HEENT:  No gross abnormalities Abdomen: Soft and non-tender  Musculoskeletal: There are no major deformities.   Neurologic: Sensation and motor function are intact in the left leg  Skin: There are no ulcer or rashes noted. Psychiatric: The patient has normal affect. Cardiovascular: There is a regular rate and rhythm without significant murmur appreciated.  No pulse or Doppler signal within left axillary profunda bypass  Diagnostic Studies: None   Assessment:  Ischemic left leg Plan: I discussed with the patient and her family that her left femoral profunda bypass graft has occluded.  This is the etiology of her symptoms.  It is unclear at this time as to why this occluded.  This will have to be determined in the operating room and the ultimate operative plan will be determined on Y this graft failed.  We discussed the possibility of needing an outflow procedure (left femoral-popliteal bypass).  We also discussed the possibility of reexploration of  her axillary incision and may be even redo axillary profunda bypass.  We also discussed the possibility of needing 4 compartment fasciotomies     V. Leia Alf, M.D. [facility] Office:  [phone] Pager:  [phone]

## 2015-08-05 NOTE — Anesthesia Preprocedure Evaluation (Addendum)
Anesthesia Evaluation  Patient identified by MRN, date of birth, ID band Patient awake    Reviewed: Allergy & Precautions, NPO status , Patient's Chart, lab work & pertinent test results  History of Anesthesia Complications Negative for: history of anesthetic complications  Airway Mallampati: I   Neck ROM: Full    Dental  (+) Edentulous Upper, Edentulous Lower, Dental Advisory Given, Poor Dentition, Loose,    Pulmonary former smoker,     + decreased breath sounds      Cardiovascular + Peripheral Vascular Disease   Rhythm:Regular Rate:Normal     Neuro/Psych    GI/Hepatic GERD  Medicated,  Endo/Other    Renal/GU      Musculoskeletal  (+) Fibromyalgia -  Abdominal   Peds  Hematology  (+) Sickle cell trait ,   Anesthesia Other Findings   Reproductive/Obstetrics                         Anesthesia Physical Anesthesia Plan  ASA: III and emergent  Anesthesia Plan: General   Post-op Pain Management:    Induction: Intravenous  Airway Management Planned: Oral ETT  Additional Equipment: CVP  Intra-op Plan:   Post-operative Plan: Extubation in OR  Informed Consent: I have reviewed the patients History and Physical, chart, labs and discussed the procedure including the risks, benefits and alternatives for the proposed anesthesia with the patient or authorized representative who has indicated his/her understanding and acceptance.   Dental advisory given  Plan Discussed with: CRNA, Anesthesiologist and Surgeon  Anesthesia Plan Comments:        Anesthesia Quick Evaluation

## 2015-08-05 NOTE — ED Notes (Signed)
EXTRA BLOOD AND  ONE SET OF BLOOD CULTURES IN THE LAB

## 2015-08-05 NOTE — ED Provider Notes (Signed)
CSN: [id number]     Arrival date & time 08/05/15  1741 History   First MD Initiated Contact with Patient 08/05/15 1807     Chief Complaint  Patient presents with  . Leg Pain    HPI Comments: 70 year old female presents with acute onset of pain at about 5pm on her left side. 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 She has been following Dr. Bridgett Larsson with Vascular Surgery. She states she was walking at home when she had a sudden onset of pain which has been constant. After that she was unable to ambulate. Rating pain 10/10.        Patient is a 69 y.o. female presenting with leg pain.  Leg Pain Associated symptoms: no fever     Past Medical History  Diagnosis Date  . COPD (chronic obstructive pulmonary disease) ([place])   . Pneumonia 12-2011  . GERD (gastroesophageal reflux disease)   . Headache(784.0)   . Arthritis     osteoarthritis  . Allergy   . Depression   . Neuromuscular disorder ([place])   . Osteoporosis   . Bronchitis     CURRENTLY AS OF 06/30/12 - HAS COUGH AND FINISHED ANTIBIOTIC FOR BRONCHITIS  . Fibromyalgia   . Pain     ABDOMINAL PAIN AND NAUSEA  . Pain     SOMETIMES PAIN RIGHT EAR AND NECK--STATES CAUSED BY A "LUMP" ON BACK OF EAR--USES KENALOG CREAM TOPICALLY AS NEEDED.  Marland Kitchen Gastrocutaneous fistula   . Anemia   . Anxiety   . Blood transfusion without reported diagnosis   . Heart murmur     young  . Peripheral vascular disease (HCC)     hx  ?leg  . History of kidney stones   . Pulmonary embolism [facility])    Past Surgical History  Procedure Laterality Date  . Abdominal hysterectomy    . Esophagogastroduodenoscopy  04/18/2012    Procedure: ESOPHAGOGASTRODUODENOSCOPY (EGD);  Surgeon: Inda Castle, MD;  Location: [facility];  Service: Endoscopy;  Laterality: N/A;  . Eus  05/29/2012    Procedure: UPPER  ENDOSCOPIC ULTRASOUND (EUS) LINEAR;  Surgeon: Milus Banister, MD;  Location: [facility];  Service: Endoscopy;  Laterality: N/A;  . Appendectomy    . Spine surgery      CERVICAL SPINE SURGERY X 2 - INCLUDING FUSION; LUMBAR SURGERY FOR RUPTURED DISC  . Eye surgery      BILATERAL CATARACT EXTRACTIONS  . Laparoscopic abdominal exploration N/A 07/02/2012    Procedure: converted to laparotomy with gastric biopsy;  Surgeon: Imogene Burn. Georgette Dover, MD;  Location: [facility];  Service: General;  Laterality: N/A;  Laparoscopic Gastric Biospy attempted.   . Laparotomy N/A 07/07/2012    Procedure: EXPLORATORY LAPAROTOMY repair of gastric perforation with omental graham patch, drainage of abdominal abcess;  Surgeon: Imogene Burn. Georgette Dover, MD;  [facility]: [facility];  Service: General;  Laterality: N/A;  . Stomach surgery  07/07/2012    Omental patch of gastric perforation  . Laparotomy N/A 07/13/2012    Procedure: EXPLORATORY LAPAROTOMY repair gastric leak;  Surgeon: Edward Jolly, MD;  [facility]: [facility];  Service: General;  Laterality: N/A;  . Laparotomy N/A 11/11/2012    Procedure: EXPLORATORY LAPAROTOMY;  Surgeon: Imogene Burn. Georgette Dover, MD;  Location: [place];  Service: General;  Laterality: N/A;  . Bowel resection  N/A 11/11/2012    Procedure: SMALL BOWEL RESECTION;  Surgeon: Imogene Burn. Georgette Dover, MD;  Location: [place];  Service: General;  Laterality: N/A;  . Minor application of wound vac N/A 11/11/2012    Procedure: APPLICATION OF WOUND VAC;  Surgeon: Imogene Burn. Georgette Dover, MD;  Location: [place];  Service: General;  Laterality: N/A;  . Jejunostomy N/A 11/11/2012    Procedure: PLACEMENT OF FEEDING JEJUNOSTOMY TUBE;  Surgeon: Imogene Burn. Georgette Dover, MD;  Location: [place];  Service: General;  Laterality: N/A;  . Lysis of adhesion N/A 11/11/2012    Procedure: LYSIS OF ADHESION;  Surgeon: Imogene Burn. Georgette Dover, MD;  Location: [place];  Service: General;  Laterality: N/A;  . Hernia repair    . Gastric fistula repair      05/16/2013  . Colonoscopy    .  Polypectomy    . Upper gastrointestinal endoscopy    . Abdominal aortagram N/A 08/19/2014    Procedure: ABDOMINAL Maxcine Ham;  Surgeon: Conrad [place], MD;  Location: [facility] CATH LAB;  Service: Cardiovascular;  Laterality: N/A;  . Abdominal aortic endovascular stent graft N/A 08/26/2014    Procedure: AORTIC ENDOVASCULAR  COVERED STENT ;  Surgeon: Conrad [place], MD;  Location: [place];  Service: Vascular;  Laterality: N/A;  IVUS  . Femoral-femoral bypass graft Bilateral 09/04/2014    Procedure: Right External Iliac and right comon femoral endarterectomy with patching and right to left BYPASS GRAFT FEMORAL-FEMORAL ARTERY, ;  Surgeon: Rosetta Posner, MD;  Location: [place];  Service: Vascular;  Laterality: Bilateral;  . Wound exploration Left 12/30/2014    Procedure: WOUND EXPLORATION;  Surgeon: Conrad [place], MD;  Location: [place];  Service: Vascular;  Laterality: Left;  . Irrigation and debridement abscess Bilateral 12/30/2014    Procedure: IRRIGATION AND DEBRIDEMENT of bilateral groin ABSCESSes;  Surgeon: Conrad Heath, MD;  Location: [place];  Service: Vascular;  Laterality: Bilateral;  . Application of wound vac Right 12/30/2014    Procedure: APPLICATION OF WOUND VAC to right groin;  Surgeon: Conrad [place], MD;  Location: [place];  Service: Vascular;  Laterality: Right;  . Femoral-femoral bypass graft Bilateral 12/31/2014    Procedure: REMOVAL INFECTED RIGHT FEMORAL ARTERY TO LEFT FEMORAL ARTERY BYPASS GRAFT;  Surgeon: Conrad [place], MD;  Location: [place];  Service: Vascular;  Laterality: Bilateral;  . Axillary-femoral bypass graft Left 12/31/2014    Procedure: LEFT  AXILLA RY ARTERY TO LEFT FEMORAL ARTERY BYPASS GRAFT;  Surgeon: Conrad [place], MD;  Location: [place];  Service: Vascular;  Laterality: Left;  . Patch angioplasty Right 12/31/2014    Procedure: RIGHT FEMORAL ARTERY PATCH ANGIOPLASTY USING Rueben Bash BIOLOGIC PATCH;  Surgeon: Conrad [place], MD;  Location: [place];  Service: Vascular;  Laterality: Right;  . Intraoperative  arteriogram Left 12/31/2014    Procedure: INTRA OPERATIVE ARTERIOGRAM;  Surgeon: Conrad [place], MD;  Location: [place];  Service: Vascular;  Laterality: Left;  . Wound exploration Left 01/11/2015    Procedure: LEFT AXILLARY WOUND EXPLORATION AND DRAINAGE OF SEROMA;  Surgeon: Conrad [place], MD;  Location: [place];  Service: Vascular;  Laterality: Left;  . Application of wound vac Left 01/11/2015    Procedure: APPLICATION OF NEGATIVE PRESSURE WOUND DRESSING TO LEFT AXILLA  AND LEFT GROIN;  Surgeon: Conrad [place], MD;  Location: [place];  Service: Vascular;  Laterality: Left;  . Ovarian cyst removal    . Laparotomy N/A 06/07/2015    Procedure: EXPLORATORY LAPAROTOMY, LYSIS OF ADHESIONS,  WITH BOWEL RESECTION OFR SMALL BOWEL INTUSSUSCEPTION;  Surgeon: Arta Bruce Kinsinger, MD;  [facility];  Service: General;  Laterality: N/A;   Family History  Problem Relation Age of Onset  . COPD Mother   . Hyperlipidemia Mother   . Hypertension Maternal Grandmother   . Stroke Maternal Grandmother   . Colon cancer Neg Hx    Social History  Substance Use Topics  . Smoking status: Former Smoker -- 0.50 packs/day for 35 years    Types: Cigarettes    Start date: 01/04/2003    Quit date: 04/17/2005  . Smokeless tobacco: Never Used  . Alcohol Use: No   OB History    No data available     Review of Systems  Constitutional: Negative for fever.  Musculoskeletal: Positive for arthralgias and gait problem.       Pain over graft  All other systems reviewed and are negative.   Allergies  Avelox; Betadine; Alendronate sodium; Aspirin; Codeine; Doxycycline; Fluconazole; Hydrocodone; Hydrocodone-acetaminophen; Morphine and related; Neurontin; Nsaids; Quinolones; Sertraline hcl; Sulfamethoxazole; Latex; and Sulfa antibiotics  Home Medications   Prior to Admission medications   Medication Sig Start Date End Date Taking? Authorizing Provider  acetaminophen (TYLENOL) 500 MG tablet Take 1 tablet (500 mg total) by mouth  every 8 (eight) hours as needed for mild pain or moderate pain. Reported on 05/20/2015 06/14/15  Yes Simbiso Ranga, MD  calcium gluconate 500 MG tablet Take 1 tablet by mouth 3 (three) times daily.   Yes Historical Provider, MD  dicyclomine (BENTYL) 10 MG capsule TAKE TWO CAPSULES (20MG ) 4 TIMES A DAY (BEFORE MEALS AND AT BEDTIME) 07/28/15  Yes [name] Koyanagi, MD  feeding supplement, RESOURCE BREEZE, (RESOURCE BREEZE) LIQD Take 1 Container by mouth 3 (three) times daily between meals. 06/05/14  Yes Modena Jansky, MD  ketotifen (ZADITOR) 0.025 % ophthalmic solution Place 1 drop into both eyes 2 (two) times daily.   Yes Historical Provider, MD  Multiple Vitamin (MULTIVITAMIN WITH MINERALS) TABS tablet Take 1 tablet by mouth daily. 01/12/15  Yes Samantha J Rhyne, PA-C  oxyCODONE-acetaminophen (PERCOCET/ROXICET) 5-325 MG tablet Take 1-2 tablets by mouth every 4 (four) hours as needed for moderate pain. 07/29/15  Yes [name] Koyanagi, MD  pregabalin (LYRICA) 50 MG capsule Take 1 capsule (50 mg total) by mouth 3 (three) times daily. Reported on 07/12/2015 07/29/15  Yes [name] Koyanagi, MD  Probiotic Product (PROBIOTIC DAILY PO) Take 1 capsule by mouth daily.   Yes Historical Provider, MD  ranitidine (ZANTAC) 150 MG tablet Take 1 tablet (150 mg total) by mouth 2 (two) times daily. 07/29/15  Yes [name] Koyanagi, MD  vitamin C (ASCORBIC ACID) 500 MG tablet Take 500 mg by mouth daily. Reported on 07/12/2015   Yes Historical Provider, MD  Vitamin D, Ergocalciferol, (DRISDOL) 50000 units CAPS capsule Take 1 capsule (50,000 Units total) by mouth every 7 (seven) days. 07/27/15  Yes [name] Koyanagi, MD  zinc gluconate 50 MG tablet Take 50 mg by mouth daily.   Yes Historical Provider, MD  docusate sodium (COLACE) 100 MG capsule Take 1 capsule (100 mg total) by mouth 2 (two) times daily. 06/14/15   Simbiso Ranga, MD  fluocinonide cream (LIDEX) [id number] % Apply 1 application topically 2 (two) times daily as needed.  Reported on 05/05/2015 11/07/14   Historical Provider, MD  mupirocin ointment (BACTROBAN) 2 % Place 1 application into the nose 2 (two) times daily. Patient not taking: Reported on 08/02/2015 05/05/15  Robyn Haber, MD  ondansetron (ZOFRAN) 4 MG tablet Take 4 mg by mouth every 8 (eight) hours as needed for nausea or vomiting.    Historical Provider, MD  polyethylene glycol powder (GLYCOLAX/MIRALAX) powder MIX 17 G (1 CAPFUL) IN 8 OZ OF LIQUID 2 TIMES A DAY AS NEEDED Patient taking differently: MIX 17 G (1 CAPFUL) IN 8 OZ OF LIQUID 2 TIMES A DAY AS NEEDED FOR CONSTIPATION. 05/24/15   [name] Koyanagi, MD  warfarin (COUMADIN) 5 MG tablet Take one tablet by mouth daily except 1/2 tablet on Mon. & Fri. Or as directed by the [facility] Patient taking differently: Take 2.5-5 mg by mouth daily. Take 1 tablet (5 mg) by mouth daily on Sun, Tues, Thurs and Sat and Take 0.5 tablet (2.5 mg) on Mon, Wed and Fri. 07/15/15   Pixie Casino, MD   BP 166/106 mmHg  Pulse 72  Temp(Src) 98.4 F (36.9 C) (Oral)  Resp 26  SpO2 100%   Physical Exam  Constitutional: She is oriented to person, place, and time. She appears distressed.  Cachetic, writhing in bed from pain  HENT:  Head: Normocephalic and atraumatic.  Eyes: Conjunctivae are normal. Pupils are equal, round, and reactive to light. Right eye exhibits no discharge. Left eye exhibits no discharge. No scleral icterus.  Neck: Normal range of motion.  Cardiovascular: Normal rate and regular rhythm.  Exam reveals no gallop and no friction rub.   No murmur heard. Pulses:      Dorsalis pedis pulses are 2+ on the right side, and 0 on the left side.       Posterior tibial pulses are 2+ on the right side, and 0 on the left side.  Pulmonary/Chest: Effort normal and breath sounds normal. No respiratory distress. She has no wheezes. She has no rales. She exhibits no tenderness.  Abdominal: Soft. Bowel sounds are normal. She exhibits no distension and no mass.  There is no tenderness. There is no rebound and no guarding.  Well-healed horizontal scar over lower abdomen with tenderness to palpation  Neurological: She is alert and oriented to person, place, and time.  Skin: Skin is warm and dry.  Vascular graft on L side from underarm to pelvis with tenderness to palpation  Psychiatric: She has a normal mood and affect.    ED Course  Procedures (including critical care time) Labs Review Labs Reviewed  CBC WITH DIFFERENTIAL/PLATELET - Abnormal; Notable for the following:    WBC 11.4 (*)    RDW 17.5 (*)    Neutro Abs 9.9 (*)    All other components within normal limits  COMPREHENSIVE METABOLIC PANEL - Abnormal; Notable for the following:    Potassium 3.1 (*)    Glucose, Bld 142 (*)    Total Protein 8.4 (*)    All other components within normal limits  URINALYSIS, ROUTINE W REFLEX MICROSCOPIC (NOT AT [facility]) - Abnormal; Notable for the following:    Glucose, UA 100 (*)    Hgb urine dipstick LARGE (*)    Protein, ur 100 (*)    All other components within normal limits  PROTIME-INR - Abnormal; Notable for the following:    Prothrombin Time 23.1 (*)    INR 2.06 (*)    All other components within normal limits  URINE MICROSCOPIC-ADD ON - Abnormal; Notable for the following:    Squamous Epithelial / LPF 0-5 (*)    Bacteria, UA RARE (*)    All other components within normal limits  Imaging Review No results found. I have personally reviewed and evaluated these images and lab results as part of my medical decision-making.   EKG Interpretation None      MDM   Final diagnoses:  Limb ischemia   Patient with complicated medical history is in significant amount of pain. 2mg  Dilaudid given with some relief. Doppler has shown absence of pulse on the right side. Patient's symptoms are consistent with development of ischemic limb. Shared visit with Dr. Maryan Rued  who spoke with Vascular surgery. She will be transferred to [facility].  Recardo Evangelist, PA-C 08/05/15 2132  Blanchie Dessert, MD 08/06/15 [phone]

## 2015-08-05 NOTE — ED Notes (Signed)
Dr Brabham at bedside. 

## 2015-08-05 NOTE — ED Notes (Signed)
Per EMS pt complaint of left leg pain onset 1700 08/04/2012; sensation and pulse present.

## 2015-08-05 NOTE — ED Notes (Signed)
Pt to OR with Dr. Russella Dar and this RN, bedside report given to OR staff.

## 2015-08-05 NOTE — ED Notes (Signed)
Received report from [place], pt alertx4, family at bedside. MD has been notified of patient's arrival.

## 2015-08-06 ENCOUNTER — Inpatient Hospital Stay (HOSPITAL_COMMUNITY): Payer: Medicare Other

## 2015-08-06 DIAGNOSIS — M79605 Pain in left leg: Secondary | ICD-10-CM

## 2015-08-06 DIAGNOSIS — I998 Other disorder of circulatory system: Secondary | ICD-10-CM | POA: Diagnosis present

## 2015-08-06 DIAGNOSIS — J988 Other specified respiratory disorders: Secondary | ICD-10-CM

## 2015-08-06 DIAGNOSIS — Z9911 Dependence on respirator [ventilator] status: Secondary | ICD-10-CM

## 2015-08-06 LAB — BASIC METABOLIC PANEL
Anion gap: 9 (ref 5–15)
BUN: 7 mg/dL (ref 6–20)
CALCIUM: 7.8 mg/dL — AB (ref 8.9–10.3)
CHLORIDE: 108 mmol/L (ref 101–111)
CO2: 22 mmol/L (ref 22–32)
CREATININE: 0.35 mg/dL — AB (ref 0.44–1.00)
GFR calc non Af Amer: >60 mL/min (ref >60)
GLUCOSE: 171 mg/dL — AB (ref 65–99)
Potassium: 3.8 mmol/L (ref 3.5–5.1)
Sodium: 139 mmol/L (ref 135–145)

## 2015-08-06 LAB — GLUCOSE, CAPILLARY
GLUCOSE-CAPILLARY: 118 mg/dL — AB (ref 65–99)
GLUCOSE-CAPILLARY: 103 mg/dL — AB (ref 65–99)
Glucose-Capillary: 97 mg/dL (ref 65–99)

## 2015-08-06 LAB — POCT I-STAT 3, ART BLOOD GAS (G3+)
BICARBONATE: 25 meq/L — AB (ref 20.0–24.0)
O2 SAT: 100 %
PCO2 ART: 40.6 mmHg (ref 35.0–45.0)
PO2 ART: 348 mmHg — AB (ref 80.0–100.0)
Patient temperature: 36.7
TCO2: 26 mmol/L (ref 0–100)
pH, Arterial: 7.396 (ref 7.350–7.450)

## 2015-08-06 LAB — CBC
HEMATOCRIT: 27 % — AB (ref 36.0–46.0)
HEMOGLOBIN: 9.1 g/dL — AB (ref 12.0–15.0)
MCH: 29 pg (ref 26.0–34.0)
MCHC: 33.7 g/dL (ref 30.0–36.0)
MCV: 86 fL (ref 78.0–100.0)
Platelets: 112 10*3/uL — ABNORMAL LOW (ref 150–400)
RBC: 3.14 MIL/uL — ABNORMAL LOW (ref 3.87–5.11)
RDW: 17.4 % — AB (ref 11.5–15.5)
WBC: 15 10*3/uL — ABNORMAL HIGH (ref 4.0–10.5)

## 2015-08-06 LAB — PROTIME-INR
INR: 2.71 — AB (ref 0.00–1.49)
Prothrombin Time: 28.4 seconds — ABNORMAL HIGH (ref 11.6–15.2)

## 2015-08-06 LAB — TRIGLYCERIDES: TRIGLYCERIDES: 176 mg/dL — AB (ref <150)

## 2015-08-06 LAB — MRSA PCR SCREENING: MRSA BY PCR: NEGATIVE

## 2015-08-06 MED ORDER — ONDANSETRON HCL 4 MG/2ML IJ SOLN: 4.0000 mg | Freq: Once | INTRAMUSCULAR | Status: DC | PRN

## 2015-08-06 MED ORDER — PHENOL 1.4 % MT LIQD: 1.0000 | OROMUCOSAL | Status: DC | PRN

## 2015-08-06 MED ORDER — MAGNESIUM SULFATE 2 GM/50ML IV SOLN: 2.0000 g | Freq: Every day | INTRAVENOUS | Status: DC | PRN

## 2015-08-06 MED ORDER — FENTANYL CITRATE (PF) 100 MCG/2ML IJ SOLN
50.0000 ug | INTRAMUSCULAR | Status: AC | PRN
  Administered 2015-08-06 (×3): 50 ug via INTRAVENOUS
  Filled 2015-08-06: qty 2

## 2015-08-06 MED ORDER — HYDROMORPHONE HCL 1 MG/ML IJ SOLN
1.0000 mg | INTRAMUSCULAR | Status: DC | PRN
  Administered 2015-08-06 – 2015-08-09 (×21): 2 mg via INTRAVENOUS
  Administered 2015-08-09: 1 mg via INTRAVENOUS
  Filled 2015-08-06 (×3): qty 2
  Filled 2015-08-06: qty 1
  Filled 2015-08-06 (×10): qty 2
  Filled 2015-08-06: qty 1
  Filled 2015-08-06 (×8): qty 2

## 2015-08-06 MED ORDER — MORPHINE SULFATE (PF) 2 MG/ML IV SOLN: 2.0000 mg | INTRAVENOUS | Status: DC | PRN

## 2015-08-06 MED ORDER — POTASSIUM CHLORIDE CRYS ER 20 MEQ PO TBCR: 20.0000 meq | EXTENDED_RELEASE_TABLET | Freq: Every day | ORAL | Status: DC | PRN

## 2015-08-06 MED ORDER — FENTANYL CITRATE (PF) 100 MCG/2ML IJ SOLN
50.0000 ug | INTRAMUSCULAR | Status: DC | PRN
  Administered 2015-08-06: 50 ug via INTRAVENOUS
  Filled 2015-08-06 (×3): qty 2

## 2015-08-06 MED ORDER — ALUM & MAG HYDROXIDE-SIMETH 200-200-20 MG/5ML PO SUSP: 15.0000 mL | ORAL | Status: DC | PRN

## 2015-08-06 MED ORDER — CHLORHEXIDINE GLUCONATE 0.12% ORAL RINSE (MEDLINE KIT)
15.0000 mL | Freq: Two times a day (BID) | OROMUCOSAL | Status: DC
  Administered 2015-08-06 (×2): 15 mL via OROMUCOSAL

## 2015-08-06 MED ORDER — FENTANYL CITRATE (PF) 100 MCG/2ML IJ SOLN
25.0000 ug | INTRAMUSCULAR | Status: DC | PRN
Start: 2015-08-06 — End: 2015-08-09
  Administered 2015-08-06 – 2015-08-08 (×3): 50 ug via INTRAVENOUS
  Filled 2015-08-06 (×3): qty 2

## 2015-08-06 MED ORDER — METOPROLOL TARTRATE 1 MG/ML IV SOLN
2.0000 mg | INTRAVENOUS | Status: AC | PRN
  Administered 2015-08-08: 5 mg via INTRAVENOUS
  Administered 2015-08-08: 3 mg via INTRAVENOUS
  Filled 2015-08-06 (×2): qty 5

## 2015-08-06 MED ORDER — CETYLPYRIDINIUM CHLORIDE 0.05 % MT LIQD
7.0000 mL | Freq: Two times a day (BID) | OROMUCOSAL | Status: DC
  Administered 2015-08-06 – 2015-08-12 (×13): 7 mL via OROMUCOSAL

## 2015-08-06 MED ORDER — VANCOMYCIN HCL IN DEXTROSE 750-5 MG/150ML-% IV SOLN
750.0000 mg | Freq: Two times a day (BID) | INTRAVENOUS | Status: DC
  Administered 2015-08-06 – 2015-08-07 (×3): 750 mg via INTRAVENOUS
  Filled 2015-08-06 (×4): qty 150

## 2015-08-06 MED ORDER — ANTISEPTIC ORAL RINSE SOLUTION (CORINZ)
7.0000 mL | Freq: Four times a day (QID) | OROMUCOSAL | Status: DC
  Administered 2015-08-06 (×3): 7 mL via OROMUCOSAL

## 2015-08-06 MED ORDER — FENTANYL CITRATE (PF) 100 MCG/2ML IJ SOLN: 25.0000 ug | INTRAMUSCULAR | Status: DC | PRN

## 2015-08-06 MED ORDER — LABETALOL HCL 5 MG/ML IV SOLN
10.0000 mg | INTRAVENOUS | Status: AC | PRN
  Administered 2015-08-06 (×4): 10 mg via INTRAVENOUS
  Filled 2015-08-06 (×4): qty 4

## 2015-08-06 MED ORDER — KCL IN DEXTROSE-NACL 20-5-0.45 MEQ/L-%-% IV SOLN
INTRAVENOUS | Status: DC
  Administered 2015-08-06: 100 mL/h via INTRAVENOUS
  Administered 2015-08-06 – 2015-08-08 (×4): via INTRAVENOUS
  Filled 2015-08-06 (×8): qty 1000

## 2015-08-06 MED ORDER — ONDANSETRON HCL 4 MG/2ML IJ SOLN
4.0000 mg | Freq: Four times a day (QID) | INTRAMUSCULAR | Status: DC | PRN
  Administered 2015-08-06 – 2015-08-08 (×2): 4 mg via INTRAVENOUS
  Filled 2015-08-06 (×2): qty 2

## 2015-08-06 MED ORDER — SODIUM CHLORIDE 0.9 % IV SOLN
500.0000 mL | Freq: Once | INTRAVENOUS | Status: AC | PRN
  Administered 2015-08-06: 500 mL via INTRAVENOUS

## 2015-08-06 MED ORDER — PANTOPRAZOLE SODIUM 40 MG IV SOLR: 40.0000 mg | Freq: Every day | INTRAVENOUS | Status: DC

## 2015-08-06 MED ORDER — PANTOPRAZOLE SODIUM 40 MG PO PACK: 40.0000 mg | PACK | Freq: Every day | ORAL | Status: DC

## 2015-08-06 MED ORDER — INSULIN ASPART 100 UNIT/ML ~~LOC~~ SOLN: 0.0000 [IU] | Freq: Every day | SUBCUTANEOUS | Status: DC

## 2015-08-06 MED ORDER — PANTOPRAZOLE SODIUM 40 MG PO TBEC: 40.0000 mg | DELAYED_RELEASE_TABLET | Freq: Every day | ORAL | Status: DC

## 2015-08-06 MED ORDER — INSULIN ASPART 100 UNIT/ML ~~LOC~~ SOLN
0.0000 [IU] | Freq: Three times a day (TID) | SUBCUTANEOUS | Status: DC
  Administered 2015-08-07 – 2015-08-08 (×2): 2 [IU] via SUBCUTANEOUS
  Administered 2015-08-09: 1 [IU] via SUBCUTANEOUS

## 2015-08-06 MED ORDER — GUAIFENESIN-DM 100-10 MG/5ML PO SYRP: 15.0000 mL | ORAL_SOLUTION | ORAL | Status: DC | PRN

## 2015-08-06 MED ORDER — LORAZEPAM 2 MG/ML IJ SOLN
1.0000 mg | Freq: Four times a day (QID) | INTRAMUSCULAR | Status: DC | PRN
  Administered 2015-08-06: 1 mg via INTRAVENOUS
  Administered 2015-08-08: 0.25 mg via INTRAVENOUS
  Administered 2015-08-08 – 2015-08-13 (×4): 1 mg via INTRAVENOUS
  Filled 2015-08-06 (×7): qty 1

## 2015-08-06 MED ORDER — HYDRALAZINE HCL 20 MG/ML IJ SOLN
5.0000 mg | INTRAMUSCULAR | Status: AC | PRN
  Administered 2015-08-06 (×2): 5 mg via INTRAVENOUS
  Filled 2015-08-06 (×2): qty 1

## 2015-08-06 MED ORDER — PROPOFOL 500 MG/50ML IV EMUL
INTRAVENOUS | Status: DC | PRN
  Administered 2015-08-06: 25 ug/kg/min via INTRAVENOUS

## 2015-08-06 MED ORDER — IPRATROPIUM-ALBUTEROL 0.5-2.5 (3) MG/3ML IN SOLN: 3.0000 mL | RESPIRATORY_TRACT | Status: DC | PRN

## 2015-08-06 NOTE — Op Note (Signed)
Patient name: Cynthia Bowen MRN: [id number] DOB: 03/19/1946 Sex: female  08/05/2015 - 08/06/2015 Pre-operative Diagnosis: Occluded left axillary profunda bypass Post-operative diagnosis:  Same Surgeon:  Annamarie Major Assistants:  Silva Bandy Procedure:   #1: Redo left femoral artery exposure and #2: Thrombectomy of left axillary profunda bypass Anesthesia:  Gen. Blood Loss:  See anesthesia record Specimens:  Graft and swabs of the graft were sent to microbiology for culture  Findings:  The axillary profunda bypass graft was not incorporated suggesting infection.  There is also a kink within the graft.  A 4 cm portion of the graft was resected and a 8 mm interposition graft was placed.  Indications:  The patient presented to the emergency department with approximately a 3 hour history of pain in her left leg.  There was no pulse within her axillary profunda bypass graft.  It was presumed occluded, and therefore she was taken emergently to the operating room.  Risks and benefits were discussed with the patient and her family and they wished to proceed  Procedure:  The patient was identified in the holding area and taken to [place] [facility]  The patient was then placed supine on the table. general anesthesia was administered.  The patient was prepped and draped in the usual sterile fashion.  A time out was called and antibiotics were administered.  The patient's previous lateral groin incision was opened with a 10 blade.  Cautery was used to divide the subcutaneous tissue.  The graft was easily identified as it was not incorporated at this level.  I dissected out the graft which was tunneled medially.  There was approximately a 90 turn within the graft which was redundant.  It was very difficult to gain exposure to the profunda femoral artery but with traction I was able to dissect out the profunda anastomosis.  Again, the graft was not incorporated at this level.  Culture swabs of the graft were sent  for microanalysis.  The patient's INR was 2.0 at the start of the operation.  I bolused her with 4000 units of heparin.  Once the heparin circulated I transected the graft.  A #4 Fogarty catheter was advanced up towards the axillary anastomosis and a significant amount thrombus was evacuated.  Multiple passes were performed until no further clot was evacuated and there was excellent pulsatile flow.  The graft was flushed with heparin saline and then reoccluded.  I then passed a #4 and a #3 Fogarty catheter into the profunda femoral artery and evacuated clot.  There was excellent backbleeding.  This also was flushed with heparin saline and reoccluded.  I initially planned to perform a primary end to end anastomosis, however the suture tore through the graft.  I therefore selected a new 8 mm Gore-Tex graft to perform an interposition graft.  I did the distal anastomosis first with running CV 6 suture.  Once this was done there was still good backbleeding.  It was again flushed and reoccluded.  I used CoSeal around the anastomosis.  The 8 mm interposition graft was then cut the appropriate length and a end to end anastomosis was then completed between the new graft and old graft with running CV 6 suture.  Prior to completion, appropriate flushing maneuvers were performed the anastomosis was completed.  Several pledgeted repair sutures were required for hemostasis.  The patient had excellent Doppler signal and a profunda femoral artery.  I could not get a Doppler signal in the pedal vessels  or the popliteal artery.  At this point I did not feel a outflow procedure was appropriate given that there is a high risk that the graft was infected.  I felt the best thing to do was good hemostasis and closed the wound.  This was done by reapproximating the tissue with 2-0 Vicryl, 3-0 Vicryl, and staples on the skin.  The patient had an excellent graft pulse.  The patient had an inadvertent cannulation of the carotid artery  during central line placement by anesthesia.  She did have a hematoma here and possible tracheal deviation, therefore we decided to keep her intubated and returned to the ICU.  The hematoma was not expanding and did appear stable.  She was hemodynamically stable.   Disposition:  To PACU in stable condition.   Theotis Burrow, M.D. [facility] [place] Office: [phone] Pager:  [phone]

## 2015-08-06 NOTE — Anesthesia Postprocedure Evaluation (Signed)
Anesthesia Post Note  Patient: Cynthia Bowen  Procedure(s) Performed: Procedure(s) (LRB): Thrombectomy Left Axillary-Bi-Femoral Bypass Graft; Redo Femoral Exposure (Left)  Patient location during evaluation: SICU Anesthesia Type: General Level of consciousness: patient remains intubated per anesthesia plan Pain management: pain level controlled Vital Signs Assessment: post-procedure vital signs reviewed and stable Respiratory status: patient on ventilator - see flowsheet for VS and patient remains intubated per anesthesia plan Cardiovascular status: blood pressure returned to baseline Anesthetic complications: yes Comments: Patient developed a r. neck hematoma following an attempted central line insetrion. See progress note      Last Vitals:  Filed Vitals:   08/06/15 0115 08/06/15 0130  BP: 82/66 113/77  Pulse:    Temp:    Resp: 16 16    Last Pain:  Filed Vitals:   08/06/15 0142  PainSc: 10-Worst pain ever                 Tysen Roesler COKER

## 2015-08-06 NOTE — Brief Op Note (Signed)
08/05/2015 - 08/06/2015  12:43 AM  PATIENT:  Cynthia Bowen  70 y.o. female  PRE-OPERATIVE DIAGNOSIS:  ischemic left leg  POST-OPERATIVE DIAGNOSIS:  Ischemic Left Leg  PROCEDURE:  Procedure(s): Thrombectomy Left Axillary-Bi-Femoral Bypass Graft; Redo Femoral Exposure (Left)  SURGEON:  Surgeon(s) and Role:    Serafina Mitchell, MD - Primary  PHYSICIAN ASSISTANT:   ASSISTANTS: Silva Bandy   ANESTHESIA:   general  EBL:  Total I/O In: 2000 [I.V.:2000] Out: 500 [Urine:250; Blood:250]  BLOOD ADMINISTERED:none  DRAINS: none   LOCAL MEDICATIONS USED:  NONE  SPECIMEN:  Source of Specimen:  Bypass graft culture to micro  DISPOSITION OF SPECIMEN:  Micro  COUNTS:  YES  TOURNIQUET:  * No tourniquets in log *  DICTATION: .Dragon Dictation  PLAN OF CARE: Admit to inpatient   PATIENT DISPOSITION:  ICU - intubated and hemodynamically stable.   Delay start of Pharmacological VTE agent (>24hrs) due to surgical blood loss or risk of bleeding: yes

## 2015-08-06 NOTE — Progress Notes (Signed)
Subjective  - POD #1, status post thrombectomy of left axillary profunda bypass  Patient remained intubated overnight given inadvertent carotid cannulation at the time of central line placement.   Physical Exam:  Cardiac: Tachycardic but regular Pulmonary: Remains intubated Neuro: Arousable but does not follow commands Vascular: The right neck remained soft with no further expansion of the hematoma.  There is no palpable pulse within the left axillary femoral bypass       Assessment/Plan:  POD #1  The patient's left axillary profunda bypass graft has reoccluded.  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  In addition she most likely occluded her bypass graft because of poor outflow.  I would not want to proceed with a outflow procedure in the setting of possible infection.  She has a high risk for needing amputation of the left leg.  We will continue to let anesthesia and narcotics were off with plans for extubation and clinical evaluation of her left leg.  I will not restart her anticoagulation at this time, as she will likely need another surgery in the immediate future.  Annamarie Major 08/06/2015 7:57 AM --  Filed Vitals:   08/06/15 0700 08/06/15 0736  BP: 132/89 168/48  Pulse:    Temp:    Resp: 21     Intake/Output Summary (Last 24 hours) at 08/06/15 0757 Last data filed at 08/06/15 0600  Gross per 24 hour  Intake 2516.25 ml  Output    575 ml  Net 1941.25 ml     Laboratory CBC    Component Value Date/Time   WBC 15.0* 08/06/2015 0655   WBC 7.4 07/18/2015 1811   WBC 8.2 08/26/2012 1300   HGB 9.1* 08/06/2015 0655   HGB 12.0* 07/18/2015 1811   HGB 10.6* 08/26/2012 1300   HCT 27.0* 08/06/2015 0655   HCT 34.2* 07/18/2015 1811   HCT 31.2* 08/26/2012 1300   PLT PENDING 08/06/2015 0655   PLT 516* 08/26/2012 1300    BMET    Component Value Date/Time   NA 144  08/05/2015 1832   NA 138 08/26/2012 1300   K 3.1* 08/05/2015 1832   K 4.0 08/26/2012 1300   CL 106 08/05/2015 1832   CL 108* 08/26/2012 1300   CO2 24 08/05/2015 1832   CO2 24 08/26/2012 1300   GLUCOSE 142* 08/05/2015 1832   GLUCOSE 83 08/26/2012 1300   BUN 9 08/05/2015 1832   BUN 29* 08/26/2012 1300   BUN 4 08/19/2009   CREATININE 0.61 08/05/2015 1832   CREATININE 0.64 07/29/2015 1555   CREATININE 0.79 08/26/2012 1300   CALCIUM 9.3 08/05/2015 1832   CALCIUM 9.9 08/26/2012 1300   GFRNONAA >60 08/05/2015 1832   GFRNONAA >89 06/06/2015 1419   GFRNONAA >60 08/26/2012 1300   GFRAA >60 08/05/2015 1832   GFRAA >89 06/06/2015 1419   GFRAA >60 08/26/2012 1300    COAG Lab Results  Component Value Date   INR 2.71* 08/06/2015   INR 2.06* 08/05/2015   INR 4.3 08/04/2015   No results found for: PTT  Antibiotics Anti-infectives    Start     Dose/Rate Route Frequency Ordered Stop   08/06/15 0900  vancomycin (VANCOCIN) IVPB 750 mg/150 ml premix     750 mg 150 mL/hr over 60 Minutes Intravenous Every 12 hours 08/06/15 0054     08/05/15 2123  vancomycin (VANCOCIN) 1 GM/200ML IVPB    Comments:  Mcphail,  Nancy   : cabinet override      08/05/15 2123 08/06/15 0929       V. Leia Alf, M.D. [facility] [place] Office: [phone] Pager:  [phone]

## 2015-08-06 NOTE — Procedures (Signed)
Extubation Procedure Note  Patient Details:   Name: Cynthia Bowen DOB: [birth year] MRN: [id number]   Airway Documentation:     Evaluation  O2 sats: stable throughout Complications: No apparent complications Patient did tolerate procedure well. Bilateral Breath Sounds: Clear, Diminished   No   Pt extubated to 3L Venturia per MD order. Pt unable/unwilling to speak or cough and is not following commands post extubation. RT will closely monitor.   Jesse Sans 08/06/2015, 9:01 AM

## 2015-08-06 NOTE — Progress Notes (Signed)
eLink Physician-Brief Progress Note Patient Name: Cynthia Bowen DOB: 1945/12/04 MRN: [id number]   Date of Service  08/06/2015  HPI/Events of Note  Patient s/p left lower extremity thrombectomy and graft revision.  Remains intubated post-procedure.  HD stable  eICU Interventions  Vent and sedation orders entered F/U on PCXR and ABG PPI for stress ulcer propy Anticoagulation per primary attending MD     Intervention Category Major Interventions: Other:  Loy Mccartt 08/06/2015, 12:46 AM

## 2015-08-06 NOTE — Progress Notes (Signed)
Anesthesiology Note:  70 year old female with an extensive history of PVD who was brought to the [facility] with acute L. Leg ischemia. She has had a previous infected Fem -Fem BPG with removal of the graft and insertion of L. Axillary-Femoral BPG on 12/31/14. She has been  on coumadin with PT/INR 23.1/2.06 and she was started on heparin tonight at [facility] ER.  Following induction of general endotracheal anesthesia, a R. Internal jugular central line was placed. On the first attempt using ultrasound guidance, the right  Carotid artery was punctured with an 18 g vascular access needle. The R. IJ was then successfully cannulated. However, despite holding pressure for 5 minutes, she developed a hematoma in her right neck.   She then underwent thrombectomy of the L. Ax-Fem BPG and revision of the distal segment by Dr. Trula Slade. Following surgery, it was elected to leave her intubated due to the R. Neck hematoma which is not tense but has caused some degree of tracheal deviation.  Plan: 1. Keep head of bed elevated 30 degrees 2. Hold coumadin 3. Observe hematoma 4. Sedation with propofol 5. Consult CCM for assistance with vent management  Roberts Gaudy

## 2015-08-06 NOTE — Progress Notes (Signed)
Pharmacy Antibiotic Note Cynthia Bowen is a 70 y.o. female admitted on 08/05/2015 with ischemic left leg that is s/p left lower extremity thrombectomy and graft revision. Pharmacy has been consulted for vancomycin dosing for possible left ax-fem graft infection.  Plan: 1. Vancomycin 1 gram x 1 given in OR per discussion with RN; will start maintenance dose of 750 mg every 12 hours starting 4/15 at 0900 2. If continued will obtain level at [facility]; goal trough 15-20 3. Defer addition gram negative coverage to primary team 4. Await px micro data and adjust abx as needed    Temp (24hrs), Avg:98.5 F (36.9 C), Min:98.4 F (36.9 C), Max:98.5 F (36.9 C)   Recent Labs Lab 08/03/15 1130 08/05/15 1832  WBC 7.2 11.4*  CREATININE 0.60 0.61    Estimated Creatinine Clearance: 40.3 mL/min (by C-G formula based on Cr of 0.61).    Antimicrobials this admission: 4/14 Vancomycin  >>   Dose adjustments this admission: n/a  Microbiology results: 4/14 OP cx: px  Thank you for allowing pharmacy to be a part of this patient's care.  Vincenza Hews, PharmD, BCPS 08/06/2015, 1:08 AM Pager: [phone]

## 2015-08-06 NOTE — Progress Notes (Signed)
NUTRITION NOTE  Pulled to pt for new vent. Pt extubated at ~0900 this AM with sedation turned off. Pt with bilateral mitten restraints in place. Pt unable/unwilling to talk at time of brief visit and RN in the room. Pt's diet changed from NPO to Carb Modified to start at 1400 today. Pt did not screen for any other nutrition-related indicators at this time; RD will continue to monitor for nutrition-related needs. Please consult RD should nutrition-related needs arise.    Jarome Matin, RD, LDN Inpatient Clinical Dietitian Pager # [phone] After hours/weekend pager # [phone]

## 2015-08-06 NOTE — Progress Notes (Signed)
Patient's family at bedside.  She is currently extubated and complaining of significant pain throughout the left leg.  Decreased motor function of the left leg is now present.  She does state that she can still feel her foot.  She has difficulty moving her toes.  I had an extensive conversation with the patient and her family.  I discussed that I feel her bypass occluded for 2 reasons.  #1, because the patient has a history of removal of a femoral-femoral bypass graft for infection at the time the left axillary profunda graft was placed, and with the intraoperative finding that the graft was not incorporated makes me believe that this graft was infected as part of the reason for its failure.  #2, I think the patient has very poor runoff which also contributed to bypass graft failure.  i do not think the patient is a candidate for a repeat exploration.  I do not think removing the axillary profunda graft and placing another conduitin addition to a below knee popliteal bypass graft is a reasonable alternative.  I think it would carry a high morbidity and likely would have a similar outcome.  I have reviewed her intraoperative images from over a year ago which are the only images of the left leg I can locate.  Her superficial femoral and above-knee popliteal artery are occluded.  There is a below knee popliteal artery.  Runoff of these images was very restricted it appears to be a very diseased single-vessel. She is likely had progression of her tibial disease which would potentially make her unreconstructable.  I discussed with the patient and family that I think she is going to require an amputation.  Unfortunately this  Has the possibility of being a hip disarticulation, as she has having pain all the way up to her groin.  Before doing this, she will need to have her anticoagulation corrected.  We will focus on pain management as this is a major issue currently.  I'm also going to consult palliative care to  help facilitate some of these challenging issues.  Annamarie Major

## 2015-08-06 NOTE — Consult Note (Signed)
PULMONARY / CRITICAL CARE MEDICINE   Name: Cynthia Bowen MRN: [id number] DOB: 1945/11/22    ADMISSION DATE:  08/05/2015 CONSULTATION DATE:  08/06/2015  REFERRING MD:  Trula Slade  CHIEF COMPLAINT: vent management     HISTORY OF PRESENT ILLNESS:    70 yo with history of PVD, COPD, smoking, history of SBO with ex-lap, OA, history of PE, HTN  with a stent that goes from her left axilla down in her groin- who came in with sudden onset severe pain in the left groin at 5 PM. She has history of multiple grafts (5/5/16Aortic stenting with endograft, PTA+S L CIA, 09/05/14 R EIA and CFA EA w/ Dacron patch angioplasty, R to L fem-fem BPG), some which have been infected in the past. She has been following Dr. Bridgett Larsson with Vascular Surgery. She states she was walking at home when she had a sudden onset of pain which has been constant. Pt takes coumadin and her INR was 4 on Wednesday and was asked to hold the coumadin for 2 days by the clinic. She was started on heparin and sent to here.   There were no palpable pulses in lower extremities, but the pulses were dopplerable o n the left, so dopplers showed no pulse on the right side.  She was taken to the OR by Dr. Kevin Fenton, and had a thrombectomy done   We are asked to consult on this patient for vent management.   PAST MEDICAL HISTORY :  She  has a past medical history of COPD (chronic obstructive pulmonary disease) ([place]); Pneumonia (12-2011); GERD (gastroesophageal reflux disease); Headache(784.0); Arthritis; Allergy; Depression; Neuromuscular disorder ([place]); Osteoporosis; Bronchitis; Fibromyalgia; Pain; Pain; Gastrocutaneous fistula; Anemia; Anxiety; Blood transfusion without reported diagnosis; Heart murmur; Peripheral vascular disease ([place]); History of kidney stones; and Pulmonary embolism ([place]).  PAST SURGICAL HISTORY: She  has past surgical history that includes Abdominal hysterectomy; Esophagogastroduodenoscopy (04/18/2012); EUS (05/29/2012); Appendectomy;  Spine surgery; Eye surgery; Laparoscopic abdominal exploration (N/A, 07/02/2012); laparotomy (N/A, 07/07/2012); Stomach surgery (07/07/2012); laparotomy (N/A, 07/13/2012); laparotomy (N/A, 11/11/2012); Bowel resection (N/A, 11/11/2012); Minor application of wound vac (N/A, 11/11/2012); Jejunostomy (N/A, 11/11/2012); Lysis of adhesion (N/A, 11/11/2012); Hernia repair; gastric fistula repair; Colonoscopy; Polypectomy; Upper gastrointestinal endoscopy; abdominal aortagram (N/A, 08/19/2014); Abdominal aortic endovascular stent graft (N/A, 08/26/2014); Femoral-femoral Bypass Graft (Bilateral, 09/04/2014); Wound exploration (Left, 12/30/2014); Irrigation and debridement abscess (Bilateral, 12/30/2014); Application if wound vac (Right, 12/30/2014); Femoral-femoral Bypass Graft (Bilateral, 12/31/2014); Axillary-femoral Bypass Graft (Left, 12/31/2014); Patch angioplasty (Right, 12/31/2014); Intraoperative arteriogram (Left, 12/31/2014); Wound exploration (Left, 01/11/2015); Application if wound vac (Left, 01/11/2015); Ovarian cyst removal; and laparotomy (N/A, 06/07/2015).  Allergies  Allergen Reactions  . Avelox [Moxifloxacin Hcl In Nacl] Nausea And Vomiting  . Betadine [Povidone Iodine] Itching and Rash  . Alendronate Sodium Nausea And Vomiting and Other (See Comments)    dizziness  . Aspirin Nausea Only  . Codeine Nausea And Vomiting  . Doxycycline Nausea And Vomiting  . Fluconazole Nausea And Vomiting  . Hydrocodone Nausea And Vomiting    GI distress  . Hydrocodone-Acetaminophen Nausea And Vomiting  . Morphine And Related Nausea Only  . Neurontin [Gabapentin] Other (See Comments)    Mood changes   . Nsaids Other (See Comments)    Severe gastritis & perforation - avoid NSAIDs when possible  . Quinolones Hives and Itching  . Sertraline Hcl Nausea And Vomiting and Other (See Comments)    Hallucinations   . Sulfamethoxazole Hives and Itching  . Latex Rash  . Sulfa Antibiotics Rash  No current facility-administered  medications on file prior to encounter.   Current Outpatient Prescriptions on File Prior to Encounter  Medication Sig  . acetaminophen (TYLENOL) 500 MG tablet Take 1 tablet (500 mg total) by mouth every 8 (eight) hours as needed for mild pain or moderate pain. Reported on 05/20/2015  . calcium gluconate 500 MG tablet Take 1 tablet by mouth 3 (three) times daily.  Marland Kitchen dicyclomine (BENTYL) 10 MG capsule TAKE TWO CAPSULES (20MG ) 4 TIMES A DAY (BEFORE MEALS AND AT BEDTIME)  . feeding supplement, RESOURCE BREEZE, (RESOURCE BREEZE) LIQD Take 1 Container by mouth 3 (three) times daily between meals.  Marland Kitchen ketotifen (ZADITOR) 0.025 % ophthalmic solution Place 1 drop into both eyes 2 (two) times daily.  . Multiple Vitamin (MULTIVITAMIN WITH MINERALS) TABS tablet Take 1 tablet by mouth daily.  Marland Kitchen oxyCODONE-acetaminophen (PERCOCET/ROXICET) 5-325 MG tablet Take 1-2 tablets by mouth every 4 (four) hours as needed for moderate pain.  . pregabalin (LYRICA) 50 MG capsule Take 1 capsule (50 mg total) by mouth 3 (three) times daily. Reported on 07/12/2015  . Probiotic Product (PROBIOTIC DAILY PO) Take 1 capsule by mouth daily.  . ranitidine (ZANTAC) 150 MG tablet Take 1 tablet (150 mg total) by mouth 2 (two) times daily.  . vitamin C (ASCORBIC ACID) 500 MG tablet Take 500 mg by mouth daily. Reported on 07/12/2015  . Vitamin D, Ergocalciferol, (DRISDOL) 50000 units CAPS capsule Take 1 capsule (50,000 Units total) by mouth every 7 (seven) days.  Marland Kitchen zinc gluconate 50 MG tablet Take 50 mg by mouth daily.  Marland Kitchen docusate sodium (COLACE) 100 MG capsule Take 1 capsule (100 mg total) by mouth 2 (two) times daily.  . fluocinonide cream (LIDEX) [id number] % Apply 1 application topically 2 (two) times daily as needed. Reported on 05/05/2015  . mupirocin ointment (BACTROBAN) 2 % Place 1 application into the nose 2 (two) times daily. (Patient not taking: Reported on 08/02/2015)  . ondansetron (ZOFRAN) 4 MG tablet Take 4 mg by mouth every 8 (eight)  hours as needed for nausea or vomiting.  . polyethylene glycol powder (GLYCOLAX/MIRALAX) powder MIX 17 G (1 CAPFUL) IN 8 OZ OF LIQUID 2 TIMES A DAY AS NEEDED (Patient taking differently: MIX 17 G (1 CAPFUL) IN 8 OZ OF LIQUID 2 TIMES A DAY AS NEEDED FOR CONSTIPATION.)  . warfarin (COUMADIN) 5 MG tablet Take one tablet by mouth daily except 1/2 tablet on Mon. & Fri. Or as directed by the Coumadin Clinic (Patient taking differently: Take 2.5-5 mg by mouth daily. Take 1 tablet (5 mg) by mouth daily on Sun, Tues, Thurs and Sat and Take 0.5 tablet (2.5 mg) on Mon, Wed and Fri.)    FAMILY HISTORY:  Her indicated that her mother is deceased. She indicated that her father is deceased. She indicated that both of her sisters are alive. She indicated that both of her brothers are alive. She indicated that her maternal grandmother is deceased. She indicated that her maternal grandfather is deceased. She indicated that her paternal grandmother is deceased. She indicated that her paternal grandfather is deceased.   SOCIAL HISTORY: She  reports that she quit smoking about 10 years ago. Her smoking use included Cigarettes. She started smoking about 12 years ago. She has a 17.5 pack-year smoking history. She has never used smokeless tobacco. She reports that she does not drink alcohol or use illicit drugs.   VITAL SIGNS: BP 186/89 mmHg  Pulse 72  Temp(Src) 98.5 F (36.9 C) (Oral)  Resp 17  SpO2 97%  HEMODYNAMICS:    VENTILATOR SETTINGS:    INTAKE / OUTPUT:    PHYSICAL EXAMINATION: General:  Sedated and intubated Cardiovascular:  rrr no mrg Lungs:  ctab anterior Abdomen:  +BS Extremities: left thigh has bandage post-op                      LABS:  BMET  Recent Labs Lab 08/03/15 1130 08/05/15 1832  NA 142 144  K 3.9 3.1*  CL 109 106  CO2 25 24  BUN 10 9  CREATININE 0.60 0.61  GLUCOSE 116* 142*    Electrolytes  Recent Labs Lab 08/03/15 1130 08/05/15 1832  CALCIUM 9.2 9.3     CBC  Recent Labs Lab 08/03/15 1130 08/05/15 1832  WBC 7.2 11.4*  HGB 12.4 13.9  HCT 35.6* 40.2  PLT 185 156    Coag's  Recent Labs Lab 08/04/15 08/05/15 1937  INR 4.3 2.06*    Sepsis Markers No results for input(s): LATICACIDVEN, PROCALCITON, O2SATVEN in the last 168 hours.  ABG No results for input(s): PHART, PCO2ART, PO2ART in the last 168 hours.  Liver Enzymes  Recent Labs Lab 08/05/15 1832  AST 34  ALT 23  ALKPHOS 97  BILITOT 0.6  ALBUMIN 4.3    Cardiac Enzymes No results for input(s): TROPONINI, PROBNP in the last 168 hours.  Glucose No results for input(s): GLUCAP in the last 168 hours.  Imaging No results found.   STUDIES:  MR abdomen DG chest port  CULTURES: Wound culture 4/14 >   ANTIBIOTICS: Vanc 4/14>   SIGNIFICANT EVENTS: Thrombectomy 4/14   LINES/TUBES: ETT 4/14>  DISCUSSION:  70 yo woman with extensive PMH who had a thrombectomy now intubated and who had accidental puncture to the carotid artery during right IJ placement now with stable neck hematoma and continuing on mechanical ventilation   ASSESSMENT / PLAN:  Post-op Vent management   - Per CXR: ETT too high, will advance by 3cm -PRVC mechanical ventilation - fentanyl PRN and propofol for sedation - Oral Care - PPI - ABG: Ventilation settings acceptable - Wean PEEP and FiO2 to O2 sat 88-95% - f/u pan-cultures  -monitor the neck hematoma for any progression or worsening  - no further imaging of the neck at this time.    Burgess Estelle, MD IMTS PGY1 08/06/2015, 12:31 AM   I have seen and evaluated the patient with the resident.  I agree with the plan and have addended the above note as I saw fit.   Total critical care time: 30 min  Critical care time was exclusive of separately billable procedures and treating other patients.  Critical care was necessary to treat or prevent imminent or life-threatening deterioration.  Critical care was time spent  personally by me on the following activities: development of treatment plan with patient and/or surrogate as well as nursing, discussions with consultants, evaluation of patient's response to treatment, examination of patient, obtaining history from patient or surrogate, ordering and performing treatments and interventions, ordering and review of laboratory studies, ordering and review of radiographic studies, pulse oximetry and re-evaluation of patient's condition.   Meribeth Mattes, DO., MS [facility]   Pulmonary and [place] Pager: [phone]

## 2015-08-06 NOTE — Progress Notes (Signed)
PULMONARY / CRITICAL CARE MEDICINE   Name: Cynthia Bowen MRN: [id number] DOB: 29-Sep-1945    ADMISSION DATE:  08/05/2015 CONSULTATION DATE:  08/05/2015  REFERRING MD:  Dr. Trula Slade  CHIEF COMPLAINT:  Leg pain  SUBJECTIVE:  Tolerating pressure support  VITAL SIGNS: BP 117/66 mmHg  Pulse 106  Temp(Src) 98 F (36.7 C) (Axillary)  Resp 22  Ht 5\' 5"  (1.651 m)  SpO2 100%  VENTILATOR SETTINGS: Vent Mode:  [-] PRVC FiO2 (%):  [40 %-70 %] 40 % Set Rate:  [16 bmp] 16 bmp Vt Set:  [460 mL] 460 mL PEEP:  [5 cmH20] 5 cmH20 Plateau Pressure:  [12 cmH20-15 cmH20] 14 cmH20  INTAKE / OUTPUT: I/O last 3 completed shifts: In: 2516.3 [I.V.:2516.3] Out: 575 [Urine:325; Blood:250]  PHYSICAL EXAMINATION: General:  sedated Neuro:  RASS -3 HEENT:  ETT in place Cardiovascular:  Regular, no murmur Lungs:  No wheeze Abdomen:  Soft, non tender Musculoskeletal:  Decreased muscle bulk Skin:  No edema  LABS:  BMET  Recent Labs Lab 08/03/15 1130 08/05/15 1832  NA 142 144  K 3.9 3.1*  CL 109 106  CO2 25 24  BUN 10 9  CREATININE 0.60 0.61  GLUCOSE 116* 142*    Electrolytes  Recent Labs Lab 08/03/15 1130 08/05/15 1832  CALCIUM 9.2 9.3    CBC  Recent Labs Lab 08/03/15 1130 08/05/15 1832 08/06/15 0655  WBC 7.2 11.4* 15.0*  HGB 12.4 13.9 9.1*  HCT 35.6* 40.2 27.0*  PLT 185 156 PENDING    Coag's  Recent Labs Lab 08/04/15 08/05/15 1937 08/06/15 0440  INR 4.3 2.06* 2.71*    Sepsis Markers No results for input(s): LATICACIDVEN, PROCALCITON, O2SATVEN in the last 168 hours.  ABG  Recent Labs Lab 08/06/15 0231  PHART 7.396  PCO2ART 40.6  PO2ART 348.0*    Liver Enzymes  Recent Labs Lab 08/05/15 1832  AST 34  ALT 23  ALKPHOS 97  BILITOT 0.6  ALBUMIN 4.3    Cardiac Enzymes No results for input(s): TROPONINI, PROBNP in the last 168 hours.  Glucose No results for input(s): GLUCAP in the last 168 hours.  Imaging Dg Chest Port 1  View  08/06/2015  CLINICAL DATA:  Respiratory failure EXAM: PORTABLE CHEST 1 VIEW COMPARISON:  06/09/2015 FINDINGS: The endotracheal tube is 5.6 cm above the carina. The nasogastric tube extends well into the stomach. There is a right jugular sheath with tip in the upper SVC. The lungs are clear except for minimal linear scarring or atelectasis in the left base. The pulmonary vasculature is normal. There is no pneumothorax. Heart size is normal. Hilar and mediastinal contours are unremarkable. Chronic blunting of the left lateral costophrenic angle. No large effusions. IMPRESSION: Support equipment appears satisfactorily positioned. Mild linear scarring or atelectasis in the left lung base. Lungs are otherwise clear. Electronically Signed   By: Andreas Newport M.D.   On: 08/06/2015 01:17     STUDIES:   CULTURES: 4/14 Wound >>  ANTIBIOTICS: 4/14 Vancomycin >>  SIGNIFICANT EVENTS: 4/14 Admit 4/15 To OR  LINES/TUBES: 4/15 ETT >> 4/15 4/15 Rt IJ CVL >> 4/15 Aline >> 4/15  DISCUSSION: 70 yo female former smoker with Lt leg pain.  She has hx of severe PAD.  She had thrombectomy Lt axillary-bifemoral bypass graft 4/15 and remained on vent postop.  PMHx of COPD, GERD, HA, Depression, Fibromyalgia, Anxiety, PE, Nephrolithiasis  ASSESSMENT / PLAN:  PULMONARY A: Compromised airway. Hx of COPD. P:   Pressure support wean >>  hopefully can extubate soon  CARDIOVASCULAR A:  PAD s/p thrombectomy of graft >> re-occluded. Hx of PE. P:  Post-op care per VVS Resume anticoagulation when okay with [facility]VS Prn BDs  RENAL A:   No acute issues. P:   Monitor renal fx, urine outpt  GASTROINTESTINAL A:   Protein calorie malnutrition. P:   Advance diet after extubation Protonix for SUP  HEMATOLOGIC A:   Anemia of chronic disease. P:  F/u CBC  INFECTIOUS A:   Possible infected graft. P:   Vancomycin per VVS  ENDOCRINE A:   Hyperglycemia.   P:   SSI  NEUROLOGIC A:    Sedation. Hx of neuropathy. P:   RASS goal: 0 Resume lyrica once able to take pills  CC time 50 minutes.  Chesley Mires, MD [facility] Pulmonary/Critical Care 08/06/2015, 8:41 AM Pager:  [phone] After 3pm call: [phone]

## 2015-08-06 NOTE — Transfer of Care (Signed)
Immediate Anesthesia Transfer of Care Note  Patient: Cynthia Bowen  Procedure(s) Performed: Procedure(s): Thrombectomy Left Axillary-Bi-Femoral Bypass Graft; Redo Femoral Exposure (Left)  Patient Location: SICU  Anesthesia Type:General  Level of Consciousness: Patient remains intubated per anesthesia plan  Airway & Oxygen Therapy: Patient remains intubated per anesthesia plan and Patient placed on Ventilator (see vital sign flow sheet for setting)  Post-op Assessment: Report given to RN and Post -op Vital signs reviewed and stable  Post vital signs: Reviewed and stable  Last Vitals:  Filed Vitals:   08/05/15 2007 08/05/15 2031  BP: 177/107 186/89  Pulse: 84 72  Temp: 36.9 C   Resp: 20 17    Complications: No apparent anesthesia complications

## 2015-08-07 ENCOUNTER — Encounter (HOSPITAL_COMMUNITY): Payer: Self-pay | Admitting: Anesthesiology

## 2015-08-07 LAB — BASIC METABOLIC PANEL
ANION GAP: 9 (ref 5–15)
BUN: <5 mg/dL — ABNORMAL LOW (ref 6–20)
CO2: 27 mmol/L (ref 22–32)
Calcium: 7.8 mg/dL — ABNORMAL LOW (ref 8.9–10.3)
Chloride: 101 mmol/L (ref 101–111)
Creatinine, Ser: 0.45 mg/dL (ref 0.44–1.00)
GFR calc Af Amer: >60 mL/min (ref >60)
Glucose, Bld: 133 mg/dL — ABNORMAL HIGH (ref 65–99)
POTASSIUM: 3.7 mmol/L (ref 3.5–5.1)
SODIUM: 137 mmol/L (ref 135–145)

## 2015-08-07 LAB — CBC
HEMATOCRIT: 24.3 % — AB (ref 36.0–46.0)
HEMOGLOBIN: 8.2 g/dL — AB (ref 12.0–15.0)
MCH: 28.7 pg (ref 26.0–34.0)
MCHC: 33.7 g/dL (ref 30.0–36.0)
MCV: 85 fL (ref 78.0–100.0)
Platelets: 105 10*3/uL — ABNORMAL LOW (ref 150–400)
RBC: 2.86 MIL/uL — AB (ref 3.87–5.11)
RDW: 17.3 % — ABNORMAL HIGH (ref 11.5–15.5)
WBC: 13.3 10*3/uL — AB (ref 4.0–10.5)

## 2015-08-07 LAB — GLUCOSE, CAPILLARY
GLUCOSE-CAPILLARY: 152 mg/dL — AB (ref 65–99)
GLUCOSE-CAPILLARY: 106 mg/dL — AB (ref 65–99)
GLUCOSE-CAPILLARY: 107 mg/dL — AB (ref 65–99)
Glucose-Capillary: 118 mg/dL — ABNORMAL HIGH (ref 65–99)

## 2015-08-07 LAB — VANCOMYCIN, TROUGH: Vancomycin Tr: 8 ug/mL — ABNORMAL LOW (ref 10.0–20.0)

## 2015-08-07 LAB — PROTIME-INR
INR: 1.35 (ref 0.00–1.49)
Prothrombin Time: 16.8 seconds — ABNORMAL HIGH (ref 11.6–15.2)

## 2015-08-07 MED ORDER — ENOXAPARIN SODIUM 30 MG/0.3ML ~~LOC~~ SOLN
20.0000 mg | SUBCUTANEOUS | Status: DC
  Administered 2015-08-08 – 2015-08-09 (×2): 20 mg via SUBCUTANEOUS
  Filled 2015-08-07: qty 0.2
  Filled 2015-08-07: qty 0.3
  Filled 2015-08-07: qty 0.2
  Filled 2015-08-07: qty 0.3

## 2015-08-07 MED ORDER — ENOXAPARIN SODIUM 30 MG/0.3ML ~~LOC~~ SOLN
30.0000 mg | Freq: Two times a day (BID) | SUBCUTANEOUS | Status: DC
  Administered 2015-08-07: 30 mg via SUBCUTANEOUS
  Filled 2015-08-07: qty 0.3

## 2015-08-07 MED ORDER — VANCOMYCIN HCL IN DEXTROSE 1-5 GM/200ML-% IV SOLN
1000.0000 mg | Freq: Two times a day (BID) | INTRAVENOUS | Status: DC
  Administered 2015-08-07 – 2015-08-09 (×4): 1000 mg via INTRAVENOUS
  Filled 2015-08-07 (×5): qty 200

## 2015-08-07 NOTE — Progress Notes (Signed)
PULMONARY / CRITICAL CARE MEDICINE   Name: Cynthia Bowen MRN: [id number] DOB: [birth year]    ADMISSION DATE:  08/05/2015 CONSULTATION DATE:  08/05/2015  REFERRING MD:  Dr. Trula Slade  CHIEF COMPLAINT:  Leg pain  SUBJECTIVE:  Needing frequent doses of dilaudid.  VITAL SIGNS: BP 162/76 mmHg  Pulse 113  Temp(Src) 98.8 F (37.1 C) (Oral)  Resp 15  Ht 5\' 5"  (1.651 m)  Wt 81 lb 9.1 oz (37 kg)  BMI 13.57 kg/m2  SpO2 100%  INTAKE / OUTPUT: I/O last 3 completed shifts: In: 5356.2 [P.O.:11.4; I.V.:5044.8; IV Piggyback:300] Out: [id number] [Urine:3140; Blood:250]  PHYSICAL EXAMINATION: General:  sleepy Neuro: follows some simple commands HEENT: no stridor Cardiovascular:  Regular, no murmur Lungs:  No wheeze Abdomen:  Soft, non tender Musculoskeletal:  Decreased muscle bulk Skin:  No edema  LABS:  BMET  Recent Labs Lab 08/05/15 1832 08/06/15 0757 08/07/15 0333  NA 144 139 137  K 3.1* 3.8 3.7  CL 106 108 101  CO2 24 22 27   BUN 9 7 <5*  CREATININE 0.61 0.35* 0.45  GLUCOSE 142* 171* 133*    Electrolytes  Recent Labs Lab 08/05/15 1832 08/06/15 0757 08/07/15 0333  CALCIUM 9.3 7.8* 7.8*    CBC  Recent Labs Lab 08/05/15 1832 08/06/15 0655 08/07/15 0333  WBC 11.4* 15.0* 13.3*  HGB 13.9 9.1* 8.2*  HCT 40.2 27.0* 24.3*  PLT 156 112* 105*    Coag's  Recent Labs Lab 08/05/15 1937 08/06/15 0440 08/07/15 0333  INR 2.06* 2.71* 1.35    Sepsis Markers No results for input(s): LATICACIDVEN, PROCALCITON, O2SATVEN in the last 168 hours.  ABG  Recent Labs Lab 08/06/15 0231  PHART 7.396  PCO2ART 40.6  PO2ART 348.0*    Liver Enzymes  Recent Labs Lab 08/05/15 1832  AST 34  ALT 23  ALKPHOS 97  BILITOT 0.6  ALBUMIN 4.3    Cardiac Enzymes No results for input(s): TROPONINI, PROBNP in the last 168 hours.  Glucose  Recent Labs Lab 08/06/15 1308 08/06/15 1556 08/06/15 2156  GLUCAP 118* 97 103*    Imaging No results found.   STUDIES:    CULTURES: 4/14 Wound >>  ANTIBIOTICS: 4/14 Vancomycin >>  SIGNIFICANT EVENTS: 4/14 Admit 4/15 To OR  LINES/TUBES: 4/15 ETT >> 4/15 4/15 Rt IJ CVL >> 4/15 Aline >> 4/15  DISCUSSION: 70 yo female former smoker with Lt leg pain.  She has hx of severe PAD.  She had thrombectomy Lt axillary-bifemoral bypass graft 4/15 and remained on vent postop.  PMHx of COPD, GERD, HA, Depression, Fibromyalgia, Anxiety, PE, Nephrolithiasis  ASSESSMENT / PLAN:  PULMONARY A: Compromised airway. Hx of COPD. P:   Prn BDs  CARDIOVASCULAR A:  PAD s/p thrombectomy of graft >> re-occluded >> Concern she will need amputation of Lt leg . Hx of PE. P:  Post-op care per [facility] Anticoagulation per [facility]  RENAL A:   No acute issues. P:   Monitor renal fx, urine outpt  GASTROINTESTINAL A:   Protein calorie malnutrition. P:   Might need panda tube for tube feeds if not able to eat soon Protonix for SUP  HEMATOLOGIC A:   Anemia of chronic disease. P:  F/u CBC  INFECTIOUS A:   Possible infected graft. P:   Vancomycin per [facility]  ENDOCRINE A:   Hyperglycemia.   P:   SSI  NEUROLOGIC A:   Ischemic leg pain. Hx of neuropathy. P:   Prn dilaudid Resume lyrica once able to take pills  Goals of care >> Palliative care consulted.  Concern is that she will need amputation of Lt leg, and might need hip disarticulation.  D/w Dr. Kizzie Furnish, MD [place] 08/07/2015, 7:47 AM Pager:  [phone] After 3pm call: [phone]

## 2015-08-07 NOTE — Progress Notes (Signed)
Subjective  - POD #2, status post thrombectomy of left axillary profunda bypass  The patient rested more comfortably last night with IV Dilaudid.   Physical Exam:  Patient is confused The left leg is ischemic.  There is a temperature change in the mid thigh where it becomes more warm She does not have any voluntary motor function of the left leg.  Unable to assess sensory function given the patient's mental status       Assessment/Plan:  POD #2  I had an extensive conversation with the patient's family yesterday detailing the high likelihood of left leg amputation.  I appreciate the assistance of CCM and palliative care with this difficult situation.  Patient is going to need at minimum a high left above-knee amputation, possibly hip disarticulation.  Her INR has corrected.  I will start her on Lovenox for DVT prophylaxis.  I will have continuing conversations with the family regarding the need for amputation in the immediate future.  I'll check CK levels tomorrow.  I will plan for amputation early next week.  Annamarie Major 08/07/2015 9:06 AM --  Filed Vitals:   08/07/15 0600 08/07/15 0700  BP: 146/67 162/76  Pulse: 108 113  Temp:    Resp: 18 15    Intake/Output Summary (Last 24 hours) at 08/07/15 0906 Last data filed at 08/07/15 0700  Gross per 24 hour  Intake 2511.4 ml  Output   2645 ml  Net -133.6 ml     Laboratory CBC    Component Value Date/Time   WBC 13.3* 08/07/2015 0333   WBC 7.4 07/18/2015 1811   WBC 8.2 08/26/2012 1300   HGB 8.2* 08/07/2015 0333   HGB 12.0* 07/18/2015 1811   HGB 10.6* 08/26/2012 1300   HCT 24.3* 08/07/2015 0333   HCT 34.2* 07/18/2015 1811   HCT 31.2* 08/26/2012 1300   PLT 105* 08/07/2015 0333   PLT 516* 08/26/2012 1300    BMET    Component Value Date/Time   NA 137 08/07/2015 0333   NA 138 08/26/2012 1300   K 3.7 08/07/2015 0333   K 4.0 08/26/2012 1300   CL 101 08/07/2015 0333   CL 108* 08/26/2012 1300   CO2 27  08/07/2015 0333   CO2 24 08/26/2012 1300   GLUCOSE 133* 08/07/2015 0333   GLUCOSE 83 08/26/2012 1300   BUN <5* 08/07/2015 0333   BUN 29* 08/26/2012 1300   BUN 4 08/19/2009   CREATININE 0.45 08/07/2015 0333   CREATININE 0.64 07/29/2015 1555   CREATININE 0.79 08/26/2012 1300   CALCIUM 7.8* 08/07/2015 0333   CALCIUM 9.9 08/26/2012 1300   GFRNONAA >60 08/07/2015 0333   GFRNONAA >89 06/06/2015 1419   GFRNONAA >60 08/26/2012 1300   GFRAA >60 08/07/2015 0333   GFRAA >89 06/06/2015 1419   GFRAA >60 08/26/2012 1300    COAG Lab Results  Component Value Date   INR 1.35 08/07/2015   INR 2.71* 08/06/2015   INR 2.06* 08/05/2015   No results found for: PTT  Antibiotics Anti-infectives    Start     Dose/Rate Route Frequency Ordered Stop   08/06/15 0900  vancomycin (VANCOCIN) IVPB 750 mg/150 ml premix     750 mg 150 mL/hr over 60 Minutes Intravenous Every 12 hours 08/06/15 0054     08/05/15 2123  vancomycin (VANCOCIN) 1 GM/200ML IVPB    Comments:  Haynes Bast   : cabinet override      08/05/15 2123 08/06/15 [id number]  Eldridge Abrahams, M.D. [facility] Office: [phone] Pager:  [phone]

## 2015-08-07 NOTE — Plan of Care (Signed)
Problem: Pain Managment: Goal: General experience of comfort will improve Outcome: Progressing Patient complained of severe pain on the left arm and could not rate the pain or further answer any questions. Patient was alert but oriented to self only and shows grimaces when in pain and also with high BP. IV dilaudid 2 mg given twice for pain with good relief as BP and Heart rate was much improved after the pain medication. Turned and repositioned the patient and she slept well. Wound dressing on left leg ulcer changed. Bed alarm is on to prevent a fall. Patient was not agitated during my shift and didn't try to get out of bed. Will closely monitor the patient.

## 2015-08-07 NOTE — Progress Notes (Signed)
Consultation Note Date: 08/07/2015   Patient Name: Cynthia Bowen  DOB: [birth year]  MRN: [id number]  Age / Sex: 70 y.o., female  PCP: [name] Koyanagi, MD Referring Physician: Serafina Mitchell, MD  Reason for Consultation: Establishing goals of care  Clinical Assessment/Narrative: 70 year old female with past medical history of peripheral vascular disease, COPD, GERD, arthritis, fibromyalgia, anxiety, anemia, pulmonary embolism, exploratory laparotomy small bowel resection in February, who presented with worsening left leg pain. She has a history of multiple vascular interventions and recurrent graft infections. She was found to have occluded left axillary profunda bypass graft.  There are limited options for intervention and she will likely require amputation, possibly hip disarticulation. Palliative consulted to assist in goals of care as well as pain management.  I stopped by to meet with [name] this evening. No family was present and the time of my examination.  She was awake and alert but disengaged during conversation. She did respond to some questions by shaking her head yes or no. She did not verbalize throughout our encounter. When asked about pain control she shrugged her shoulders. She did deny being in acute pain.   SUMMARY OF RECOMMENDATIONS - She currently reports her pain is under control. I discussed at length with her bedside nurse who reports she has intermittent severe pain. This does seem to improve whenever she receives 2 mg of IV Dilaudid. - My initial thought would be to initiate a PCA. However, after meeting with [name] and speaking with her nurse, I'm concerned about her ability to effectively utilize PCA.  She did receive Ativan prior to our encounter which may have contributed to her not really wanting to participate in conversation. At the same time, she did appear comfortable and I would  continue this as needed.  I would recommend continuing with current regimen and will follow up daily to continue to assess if she may benefit from continuous infusion or PCA. - We'll continue to work in conjunction with other members of her care team to control pain as well as possible understanding that ischemic pain remains difficult to manage even with high doses of opioid medications. - Family not present during encounter today. I will call them to try and set up a family meeting either tomorrow or Monday in order to further discuss short and long-term goals of care.  Code Status/Advance Care Planning: Full code Code Status History    Date Active Date Inactive Code Status Order ID Comments User Context   06/07/2015  6:56 PM 06/14/2015  8:48 PM Full Code [id number]  Bonnielee Haff, MD Inpatient   12/31/2014 10:14 PM 01/12/2015  9:32 PM Full Code [id number]  Gabriel Earing, PA-C Inpatient   12/30/2014  3:25 PM 12/31/2014 10:14 PM Full Code [id number]  Gabriel Earing, PA-C Inpatient   09/05/2014  2:20 AM 09/09/2014  3:43 PM Full Code [id number]  Gabriel Earing, PA-C Inpatient   08/26/2014  6:29 PM 08/28/2014  3:19 PM Full Code [id number]  Gabriel Earing, PA-C Inpatient   08/19/2014 11:17 AM 08/19/2014  6:04 PM Full Code [id number]  Conrad Toccopola, MD Inpatient   06/02/2014 12:43 PM 06/05/2014  3:40 PM Full Code [id number]  Debbe Odea, MD ED   11/11/2012  4:07 PM 12/19/2012  1:52 PM Full Code [id number]  Imogene Burn. Tsuei, MD Inpatient   11/09/2012  4:30 PM 11/11/2012  4:07 PM Full Code [id number]  Zenovia Jarred, MD Inpatient   09/06/2012  2:36 PM 09/19/2012  2:48 PM Full Code [id number]  Earnstine Regal, PA-C ED   08/05/2012  3:55 AM 08/14/2012  3:43 PM Full Code [id number]  Rise Patience, MD Inpatient   07/07/2012  8:09 PM 07/26/2012  3:18 PM Full Code [id number]  Imogene Burn. Tsuei, MD Inpatient   07/07/2012  2:04 PM 07/07/2012  8:09 PM Full Code [id number]  Earnstine Regal, PA-C ED   04/17/2012  7:45 PM 04/19/2012  3:35 PM  Full Code [id number]  Theodis Blaze, MD ED    Advance Directive Documentation        Most Recent Value   Type of Advance Directive  Living will   Pre-existing out of facility DNR order (yellow form or pink MOST form)     "MOST" Form in Place?       Symptom Management:   As above  Palliative Prophylaxis:   Aspiration, Bowel Regimen, Delirium Protocol and Frequent Pain Assessment  Psycho-social/Spiritual:  Support System: Fair  Prognosis: Unable to determine  Discharge Planning: To be determined   Chief Complaint/ Primary Diagnoses: Present on Admission:  . Occlusion of femoral-popliteal bypass graft ([place]) . Ischemic leg  I have reviewed the medical record, interviewed the patient and family, and examined the patient. The following aspects are pertinent.  Past Medical History  Diagnosis Date  . COPD (chronic obstructive pulmonary disease) ([place])   . Pneumonia 12-2011  . GERD (gastroesophageal reflux disease)   . Headache(784.0)   . Arthritis     osteoarthritis  . Allergy   . Depression   . Neuromuscular disorder ([place])   . Osteoporosis   . Bronchitis     CURRENTLY AS OF 06/30/12 - HAS COUGH AND FINISHED ANTIBIOTIC FOR BRONCHITIS  . Fibromyalgia   . Pain     ABDOMINAL PAIN AND NAUSEA  . Pain     SOMETIMES PAIN RIGHT EAR AND NECK--STATES CAUSED BY A "LUMP" ON BACK OF EAR--USES KENALOG CREAM TOPICALLY AS NEEDED.  Marland Kitchen Gastrocutaneous fistula   . Anemia   . Anxiety   . Blood transfusion without reported diagnosis   . Heart murmur     young  . Peripheral vascular disease (H[facility])     hx  ?leg  . History of kidney stones   . Pulmonary embolism [facility])    Social History   Social History  . Marital Status: Single    Spouse Name: N/A  . Number of Children: 0  . Years of Education: N/A   Occupational History  .     Social History Main Topics  . Smoking status: Former Smoker -- 0.50 packs/day for 35 years    Types: Cigarettes    Start date: 01/04/2003    Quit date:  04/17/2005  . Smokeless tobacco: Never Used  . Alcohol Use: No  . Drug Use: No  . Sexual Activity: No   Other Topics Concern  . None   Social History Narrative   Single. Education: [employer]. Exercise.   Family History  Problem Relation Age of Onset  . COPD Mother   . Hyperlipidemia Mother   . Hypertension Maternal Grandmother   . Stroke Maternal Grandmother   . Colon cancer Neg Hx    Scheduled Meds: . antiseptic oral rinse  7 mL Mouth Rinse BID  . enoxaparin (LOVENOX) injection  30 mg Subcutaneous Q12H  . insulin aspart  0-5 Units Subcutaneous QHS  . insulin aspart  0-9 Units Subcutaneous TID WC  . pantoprazole  40 mg Oral Q1200  . vancomycin  750  mg Intravenous Q12H   Continuous Infusions: . dextrose 5 % and 0.45 % NaCl with KCl 20 mEq/L 100 mL/hr at 08/07/15 0755   PRN Meds:.fentaNYL (SUBLIMAZE) injection, guaiFENesin-dextromethorphan, HYDROmorphone (DILAUDID) injection, ipratropium-albuterol, LORazepam, magnesium sulfate 1 - 4 g bolus IVPB, metoprolol, ondansetron, phenol Medications Prior to Admission:  Prior to Admission medications   Medication Sig Start Date End Date Taking? Authorizing Provider  acetaminophen (TYLENOL) 500 MG tablet Take 1 tablet (500 mg total) by mouth every 8 (eight) hours as needed for mild pain or moderate pain. Reported on 05/20/2015 06/14/15  Yes Simbiso Ranga, MD  calcium gluconate 500 MG tablet Take 1 tablet by mouth 3 (three) times daily.   Yes Historical Provider, MD  dicyclomine (BENTYL) 10 MG capsule TAKE TWO CAPSULES (20MG ) 4 TIMES A DAY (BEFORE MEALS AND AT BEDTIME) 07/28/15  Yes [name] Koyanagi, MD  feeding supplement, RESOURCE BREEZE, (RESOURCE BREEZE) LIQD Take 1 Container by mouth 3 (three) times daily between meals. 06/05/14  Yes Modena Jansky, MD  ketotifen (ZADITOR) 0.025 % ophthalmic solution Place 1 drop into both eyes 2 (two) times daily.   Yes Historical Provider, MD  Multiple Vitamin (MULTIVITAMIN WITH MINERALS) TABS tablet  Take 1 tablet by mouth daily. 01/12/15  Yes Samantha J Rhyne, PA-C  oxyCODONE-acetaminophen (PERCOCET/ROXICET) 5-325 MG tablet Take 1-2 tablets by mouth every 4 (four) hours as needed for moderate pain. 07/29/15  Yes [name] Koyanagi, MD  pregabalin (LYRICA) 50 MG capsule Take 1 capsule (50 mg total) by mouth 3 (three) times daily. Reported on 07/12/2015 07/29/15  Yes [name] Koyanagi, MD  Probiotic Product (PROBIOTIC DAILY PO) Take 1 capsule by mouth daily.   Yes Historical Provider, MD  ranitidine (ZANTAC) 150 MG tablet Take 1 tablet (150 mg total) by mouth 2 (two) times daily. 07/29/15  Yes [name] Koyanagi, MD  vitamin C (ASCORBIC ACID) 500 MG tablet Take 500 mg by mouth daily. Reported on 07/12/2015   Yes Historical Provider, MD  Vitamin D, Ergocalciferol, (DRISDOL) 50000 units CAPS capsule Take 1 capsule (50,000 Units total) by mouth every 7 (seven) days. 07/27/15  Yes [name] Koyanagi, MD  zinc gluconate 50 MG tablet Take 50 mg by mouth daily.   Yes Historical Provider, MD  docusate sodium (COLACE) 100 MG capsule Take 1 capsule (100 mg total) by mouth 2 (two) times daily. 06/14/15   Simbiso Ranga, MD  fluocinonide cream (LIDEX) [id number] % Apply 1 application topically 2 (two) times daily as needed. Reported on 05/05/2015 11/07/14   Historical Provider, MD  mupirocin ointment (BACTROBAN) 2 % Place 1 application into the nose 2 (two) times daily. Patient not taking: Reported on 08/02/2015 05/05/15   Robyn Haber, MD  ondansetron (ZOFRAN) 4 MG tablet Take 4 mg by mouth every 8 (eight) hours as needed for nausea or vomiting.    Historical Provider, MD  polyethylene glycol powder (GLYCOLAX/MIRALAX) powder MIX 17 G (1 CAPFUL) IN 8 OZ OF LIQUID 2 TIMES A DAY AS NEEDED Patient taking differently: MIX 17 G (1 CAPFUL) IN 8 OZ OF LIQUID 2 TIMES A DAY AS NEEDED FOR CONSTIPATION. 05/24/15   [name] Koyanagi, MD  warfarin (COUMADIN) 5 MG tablet Take one tablet by mouth daily except 1/2 tablet on Mon. & Fri. Or as  directed by the [facility]mad[facility] Patient taking differently: Take 2.5-5 mg by mouth daily. Take 1 tablet (5 mg) by mouth daily on Sun, Tues, Thurs and Sat and Take 0.5 tablet (2.5 mg) on Mon,  Wed and Fri. 07/15/15   Pixie Casino, MD   Allergies  Allergen Reactions  . Avelox [Moxifloxacin Hcl In Nacl] Nausea And Vomiting  . Betadine [Povidone Iodine] Itching and Rash  . Alendronate Sodium Nausea And Vomiting and Other (See Comments)    dizziness  . Aspirin Nausea Only  . Codeine Nausea And Vomiting  . Doxycycline Nausea And Vomiting  . Fluconazole Nausea And Vomiting  . Hydrocodone Nausea And Vomiting    GI distress  . Hydrocodone-Acetaminophen Nausea And Vomiting  . Morphine And Related Nausea Only  . Neurontin [Gabapentin] Other (See Comments)    Mood changes   . Nsaids Other (See Comments)    Severe gastritis & perforation - avoid NSAIDs when possible  . Quinolones Hives and Itching  . Sertraline Hcl Nausea And Vomiting and Other (See Comments)    Hallucinations   . Sulfamethoxazole Hives and Itching  . Latex Rash  . Sulfa Antibiotics Rash    Review of Systems  Constitutional: Positive for activity change and fatigue.  Musculoskeletal: Positive for back pain.       Leg pain  All other systems reviewed and are negative.     Physical Exam  General: Awake and alert but does not fully participate in conversation. Neuro: follows some simple commands Cardiovascular: Regular, no murmur Lungs: Clear, fair air movement Abdomen: Soft, non tender  Vital Signs: BP 162/76 mmHg  Pulse 113  Temp(Src) 98.8 F (37.1 C) (Oral)  Resp 15  Ht 5\' 5"  (1.651 m)  Wt 37 kg (81 lb 9.1 oz)  BMI 13.57 kg/m2  SpO2 100%  SpO2: SpO2: 100 % O2 Device:SpO2: 100 % O2 Flow Rate: .O2 Flow Rate (L/min): 1 L/min  IO: Intake/output summary:  Intake/Output Summary (Last 24 hours) at 08/07/15 0848 Last data filed at 08/07/15 0700  Gross per 24 hour  Intake 2617.1 ml  Output   2745 ml    Net -127.9 ml    LBM: Last BM Date: 08/06/15 Baseline Weight: Weight: 37 kg (81 lb 9.1 oz) Most recent weight: Weight: 37 kg (81 lb 9.1 oz)      Palliative Assessment/Data:    Additional Data Reviewed:  CBC:    Component Value Date/Time   WBC 13.3* 08/07/2015 0333   WBC 7.4 07/18/2015 1811   WBC 8.2 08/26/2012 1300   HGB 8.2* 08/07/2015 0333   HGB 12.0* 07/18/2015 1811   HGB 10.6* 08/26/2012 1300   HCT 24.3* 08/07/2015 0333   HCT 34.2* 07/18/2015 1811   HCT 31.2* 08/26/2012 1300   PLT 105* 08/07/2015 0333   PLT 516* 08/26/2012 1300   MCV 85.0 08/07/2015 0333   MCV 86.4 07/18/2015 1811   MCV 84 08/26/2012 1300   NEUTROABS 9.9* 08/05/2015 1832   NEUTROABS 6.3 08/26/2012 1300   LYMPHSABS 1.0 08/05/2015 1832   LYMPHSABS 1.4 08/26/2012 1300   MONOABS 0.5 08/05/2015 1832   MONOABS 0.3 08/26/2012 1300   EOSABS 0.0 08/05/2015 1832   EOSABS 0.1 08/26/2012 1300   BASOSABS 0.0 08/05/2015 1832   BASOSABS 0.0 08/26/2012 1300   Comprehensive Metabolic Panel:    Component Value Date/Time   NA 137 08/07/2015 0333   NA 138 08/26/2012 1300   K 3.7 08/07/2015 0333   K 4.0 08/26/2012 1300   CL 101 08/07/2015 0333   CL 108* 08/26/2012 1300   CO2 27 08/07/2015 0333   CO2 24 08/26/2012 1300   BUN <5* 08/07/2015 0333   BUN 29* 08/26/2012 1300   BUN  4 08/19/2009   CREATININE 0.45 08/07/2015 0333   CREATININE 0.64 07/29/2015 1555   CREATININE 0.79 08/26/2012 1300   GLUCOSE 133* 08/07/2015 0333   GLUCOSE 83 08/26/2012 1300   CALCIUM 7.8* 08/07/2015 0333   CALCIUM 9.9 08/26/2012 1300   AST 34 08/05/2015 1832   AST 20 08/26/2012 1300   ALT 23 08/05/2015 1832   ALT 17 08/26/2012 1300   ALKPHOS 97 08/05/2015 1832   ALKPHOS 127 08/26/2012 1300   BILITOT 0.6 08/05/2015 1832   BILITOT 0.2 08/26/2012 1300   PROT 8.4* 08/05/2015 1832   PROT 9.1* 08/26/2012 1300   ALBUMIN 4.3 08/05/2015 1832   ALBUMIN 3.2* 08/26/2012 1300     Time In: 1720 Time Out: 1815 Time Total:  55 Greater than 50%  of this time was spent counseling and coordinating care related to the above assessment and plan.  Signed by: Micheline Rough, MD  Micheline Rough, MD  08/07/2015, 8:48 AM  Please contact Palliative Medicine Team phone at [phone] for questions and concerns.

## 2015-08-07 NOTE — Progress Notes (Signed)
Pharmacy Antibiotic Note Cynthia Bowen is a 70 y.o. female admitted on 08/05/2015 with ischemic left leg that is s/p left lower extremity thrombectomy and graft revision. Pharmacy has been consulted for vancomycin dosing for possible left ax-fem graft infection.  Vancomycin trough low at 8. No fevers, wbc 13(down).  Plan: 1. Increase vancomycin to 1g IV q12 hours 2. Recheck new level at [facility]; goal trough 10-20 3. Defer addition gram negative coverage to primary team 4. Await px micro data and adjust abx as needed    Temp (24hrs), Avg:98.7 F (37.1 C), Min:98.1 F (36.7 C), Max:98.9 F (37.2 C)   Recent Labs Lab 08/03/15 1130 08/05/15 1832 08/06/15 0655 08/06/15 0757 08/07/15 0333 08/07/15 2006  WBC 7.2 11.4* 15.0*  --  13.3*  --   CREATININE 0.60 0.61  --  0.35* 0.45  --   VANCOTROUGH  --   --   --   --   --  8*    Estimated Creatinine Clearance: 39.3 mL/min (by C-G formula based on Cr of 0.45).    Antimicrobials this admission: 4/14 Vancomycin  >>   Dose adjustments this admission: 4/16 VT 8 4/16 vancomycin 750q12>>1g q12  Microbiology results: 4/14 wound>ngtd 4/14 wound>ngtd 4/14 tissue>ngtd  Thank you for allowing pharmacy to be a part of this patient's care.  Erin Hearing PharmD., BCPS Clinical Pharmacist Pager [phone] 08/07/2015 8:54 PM

## 2015-08-08 ENCOUNTER — Telehealth: Payer: Self-pay | Admitting: Internal Medicine

## 2015-08-08 DIAGNOSIS — M6282 Rhabdomyolysis: Secondary | ICD-10-CM

## 2015-08-08 DIAGNOSIS — I998 Other disorder of circulatory system: Secondary | ICD-10-CM

## 2015-08-08 LAB — HEMOGLOBIN A1C
HEMOGLOBIN A1C: 5.2 % (ref 4.8–5.6)
Mean Plasma Glucose: 103 mg/dL

## 2015-08-08 LAB — WOUND CULTURE
CULTURE: NO GROWTH
Culture: NO GROWTH
Gram Stain: NONE SEEN

## 2015-08-08 LAB — GLUCOSE, CAPILLARY
GLUCOSE-CAPILLARY: 103 mg/dL — AB (ref 65–99)
GLUCOSE-CAPILLARY: 164 mg/dL — AB (ref 65–99)
Glucose-Capillary: 118 mg/dL — ABNORMAL HIGH (ref 65–99)
Glucose-Capillary: 154 mg/dL — ABNORMAL HIGH (ref 65–99)

## 2015-08-08 LAB — BASIC METABOLIC PANEL
Anion gap: 9 (ref 5–15)
BUN: <5 mg/dL — ABNORMAL LOW (ref 6–20)
CALCIUM: 8.5 mg/dL — AB (ref 8.9–10.3)
CHLORIDE: 99 mmol/L — AB (ref 101–111)
CO2: 29 mmol/L (ref 22–32)
CREATININE: 0.38 mg/dL — AB (ref 0.44–1.00)
Glucose, Bld: 120 mg/dL — ABNORMAL HIGH (ref 65–99)
Potassium: 3.8 mmol/L (ref 3.5–5.1)
SODIUM: 137 mmol/L (ref 135–145)

## 2015-08-08 LAB — CBC
HCT: 24.3 % — ABNORMAL LOW (ref 36.0–46.0)
HEMOGLOBIN: 8 g/dL — AB (ref 12.0–15.0)
MCH: 28.5 pg (ref 26.0–34.0)
MCHC: 32.9 g/dL (ref 30.0–36.0)
MCV: 86.5 fL (ref 78.0–100.0)
PLATELETS: 118 10*3/uL — AB (ref 150–400)
RBC: 2.81 MIL/uL — ABNORMAL LOW (ref 3.87–5.11)
RDW: 17.1 % — ABNORMAL HIGH (ref 11.5–15.5)
WBC: 12.9 10*3/uL — ABNORMAL HIGH (ref 4.0–10.5)

## 2015-08-08 LAB — PROTIME-INR
INR: 1.07 (ref 0.00–1.49)
PROTHROMBIN TIME: 14.1 s (ref 11.6–15.2)

## 2015-08-08 LAB — CK: CK TOTAL: 7403 U/L — AB (ref 38–234)

## 2015-08-08 MED ORDER — HYDRALAZINE HCL 20 MG/ML IJ SOLN
5.0000 mg | INTRAMUSCULAR | Status: DC | PRN
  Administered 2015-08-08: 5 mg via INTRAVENOUS
  Filled 2015-08-08 (×2): qty 1

## 2015-08-08 MED ORDER — DEXTROSE-NACL 5-0.9 % IV SOLN
INTRAVENOUS | Status: DC
  Administered 2015-08-08 – 2015-08-09 (×3): via INTRAVENOUS

## 2015-08-08 MED ORDER — LABETALOL HCL 5 MG/ML IV SOLN
5.0000 mg | INTRAVENOUS | Status: DC | PRN
  Administered 2015-08-08: 5 mg via INTRAVENOUS
  Filled 2015-08-08: qty 4

## 2015-08-08 NOTE — Progress Notes (Signed)
PULMONARY / CRITICAL CARE MEDICINE   Name: Cynthia Bowen MRN: [id number] DOB: [birth year]    ADMISSION DATE:  08/05/2015 CONSULTATION DATE:  08/05/2015  REFERRING MD:  Dr. Trula Slade  CHIEF COMPLAINT:  Leg pain  70 yo female former smoker with Lt leg pain.  She has hx of severe PAD.  She had thrombectomy Lt axillary-bifemoral bypass graft 4/15 and remained on vent postop.  PMHx of COPD, GERD, HA, Depression, Fibromyalgia, Anxiety, PE, Nephrolithiasis  SUBJECTIVE:  Non verbal  Nods her head to most questions  VITAL SIGNS: BP 119/74 mmHg  Pulse 173  Temp(Src) 98.5 F (36.9 C) (Axillary)  Resp 18  Ht 5\' 5"  (1.651 m)  Wt 81 lb 9.1 oz (37 kg)  BMI 13.57 kg/m2  SpO2 100%  INTAKE / OUTPUT: I/O last 3 completed shifts: In: 3915 [I.V.:3300; Other:115; IV Piggyback:500] Out: 4090 [Urine:4090]  PHYSICAL EXAMINATION: General:  Thin frail woman sitting up in bed Neuro: follows some simple commands HEENT: no stridor, no jvd Cardiovascular:  Regular, no murmur Lungs:  No wheeze Abdomen:  Soft, non tender Musculoskeletal:  Decreased muscle bulk Skin:  No edema  LABS:  BMET  Recent Labs Lab 08/06/15 0757 08/07/15 0333 08/08/15 0410  NA 139 137 137  K 3.8 3.7 3.8  CL 108 101 99*  CO2 22 27 29   BUN 7 <5* <5*  CREATININE 0.35* 0.45 0.38*  GLUCOSE 171* 133* 120*    Electrolytes  Recent Labs Lab 08/06/15 0757 08/07/15 0333 08/08/15 0410  CALCIUM 7.8* 7.8* 8.5*    CBC  Recent Labs Lab 08/06/15 0655 08/07/15 0333 08/08/15 0410  WBC 15.0* 13.3* 12.9*  HGB 9.1* 8.2* 8.0*  HCT 27.0* 24.3* 24.3*  PLT 112* 105* 118*    Coag's  Recent Labs Lab 08/06/15 0440 08/07/15 0333 08/08/15 0410  INR 2.71* 1.35 1.07    Sepsis Markers No results for input(s): LATICACIDVEN, PROCALCITON, O2SATVEN in the last 168 hours.  ABG  Recent Labs Lab 08/06/15 0231  PHART 7.396  PCO2ART 40.6  PO2ART 348.0*    Liver Enzymes  Recent Labs Lab 08/05/15 1832  AST 34   ALT 23  ALKPHOS 97  BILITOT 0.6  ALBUMIN 4.3    Cardiac Enzymes No results for input(s): TROPONINI, PROBNP in the last 168 hours.  Glucose  Recent Labs Lab 08/07/15 0911 08/07/15 1134 08/07/15 1642 08/07/15 2141 08/08/15 0758 08/08/15 1151  GLUCAP 152* 106* 107* 118* 118* 154*    Imaging No results found.   STUDIES:   CULTURES: 4/14 Wound >>  ANTIBIOTICS: 4/14 Vancomycin >>  SIGNIFICANT EVENTS: 4/14 Admit 4/15 To OR  LINES/TUBES: 4/15 ETT >> 4/15 4/15 Rt IJ CVL >> 4/15 Aline >> 4/15  DISCUSSION:   ASSESSMENT / PLAN:  PULMONARY A: Post op resp failure- extubated Hx of COPD. P:   Prn BDs  CARDIOVASCULAR A:  PAD s/p thrombectomy of graft >> re-occluded >> Concern she will need amputation of Lt leg . Hx of PE. P:  Post-op care per VVS Anticoagulation per [facility]  RENAL A:   Rhabdomyolysis due to limb ischemia P:   Monitor renal fx, urine outpt Hydrate  GASTROINTESTINAL A:   Protein calorie malnutrition. P:   Might need panda tube for tube feeds if not able to eat soon Protonix for SUP  HEMATOLOGIC A:   Anemia of chronic disease. P:  F/u CBC  INFECTIOUS A:   Possible infected graft. P:   Vancomycin per [facility]  ENDOCRINE A:   Hyperglycemia.  P:   SSI  NEUROLOGIC A:   Ischemic leg pain. Hx of neuropathy. P:   Prn dilaudid Resume lyrica once able to take pills  Goals of care >> Palliative care consulted.  Concern is that she will need amputation of Lt leg, and might need hip disarticulation. Palliative care assisting   PCCM available as needed  Kara Mead MD. FCCP. [facility] Pager [phone] If no response call [phone]     08/08/2015, 2:35 PM

## 2015-08-08 NOTE — Progress Notes (Addendum)
[facility] Progress Note  Subjective  - POD #3  Not speaking, nods her head when asked if she is having pain. Is tearful sitting in bed.   Objective Filed Vitals:   08/08/15 0600 08/08/15 0700  BP: 154/76 158/72  Pulse: 83 92  Temp:    Resp: 8 14    Intake/Output Summary (Last 24 hours) at 08/08/15 0727 Last data filed at 08/08/15 0600  Gross per 24 hour  Intake   2565 ml  Output   2495 ml  Net     70 ml   Not speaking, in NAD.  Left thigh stapled incision clean. Left ax-fem without pulse.  Left leg is cool from foot to above left knee.  Right leg warm.  Not moving toes bilaterally, but unsure of motor function due to mental status.  Weak peroneal doppler signal right per nursing.   Assessment/Planning: 70 y.o. female is s/p: status post thrombectomy of left axillary profunda bypass 3 Days Post-Op   CK elevated due to ischemic left leg. Ax-fem occluded. Will need left AKA versus hip disarticulation. Timing per Dr. Trula Slade versus outcome of palliative meeting.  Appreciate palliative care following.  Anticoagulation held in anticipation for future procedures.  Lovenox for DVT prophylaxis.   Alvia Grove 08/08/2015 7:27 AM --  Laboratory CBC    Component Value Date/Time   WBC 12.9* 08/08/2015 0410   WBC 7.4 07/18/2015 1811   WBC 8.2 08/26/2012 1300   HGB 8.0* 08/08/2015 0410   HGB 12.0* 07/18/2015 1811   HGB 10.6* 08/26/2012 1300   HCT 24.3* 08/08/2015 0410   HCT 34.2* 07/18/2015 1811   HCT 31.2* 08/26/2012 1300   PLT 118* 08/08/2015 0410   PLT 516* 08/26/2012 1300    BMET    Component Value Date/Time   NA 137 08/08/2015 0410   NA 138 08/26/2012 1300   K 3.8 08/08/2015 0410   K 4.0 08/26/2012 1300   CL 99* 08/08/2015 0410   CL 108* 08/26/2012 1300   CO2 29 08/08/2015 0410   CO2 24 08/26/2012 1300   GLUCOSE 120* 08/08/2015 0410   GLUCOSE 83 08/26/2012 1300   BUN <5* 08/08/2015 0410   BUN 29* 08/26/2012 1300   BUN 4  08/19/2009   CREATININE 0.38* 08/08/2015 0410   CREATININE 0.64 07/29/2015 1555   CREATININE 0.79 08/26/2012 1300   CALCIUM 8.5* 08/08/2015 0410   CALCIUM 9.9 08/26/2012 1300   GFRNONAA >60 08/08/2015 0410   GFRNONAA >89 06/06/2015 1419   GFRNONAA >60 08/26/2012 1300   GFRAA >60 08/08/2015 0410   GFRAA >89 06/06/2015 1419   GFRAA >60 08/26/2012 1300    COAG Lab Results  Component Value Date   INR 1.07 08/08/2015   INR 1.35 08/07/2015   INR 2.71* 08/06/2015   No results found for: PTT  Antibiotics Anti-infectives    Start     Dose/Rate Route Frequency Ordered Stop   08/07/15 2200  vancomycin (VANCOCIN) IVPB 1000 mg/200 mL premix     1,000 mg 200 mL/hr over 60 Minutes Intravenous Every 12 hours 08/07/15 2052     08/06/15 0900  vancomycin (VANCOCIN) IVPB 750 mg/150 ml premix  Status:  Discontinued     750 mg 150 mL/hr over 60 Minutes Intravenous Every 12 hours 08/06/15 0054 08/07/15 2052   08/05/15 2123  vancomycin (VANCOCIN) 1 GM/200ML IVPB    Comments:  Haynes Bast   : cabinet override      08/05/15 2123 08/06/15 [id number]  [name], PA-C [facility] Office: [phone] Pager: [phone] 08/08/2015 7:27 AM   I agree with the above.  I have spoken with the sister via telephone at length regarding the challenges we face.  Essentially her options are to proceed with a high left above-knee amputation.  If this does not heal she would likely need a hip disarticulation.  In addition, she is going to need removal of her axillary profunda bypass graft as it is likely infected.  Her CK levels are elevated.  I'll continue to monitor this.  I appreciate the assistance with palliative care.  I also discussed with the sister considering palliative measures versus amputation.  We will need to make this decision in the immediate future.  Annamarie Major

## 2015-08-08 NOTE — Telephone Encounter (Signed)
Faxed signed orders r/t PT/INR and warfarin dosing 

## 2015-08-09 ENCOUNTER — Encounter (HOSPITAL_COMMUNITY): Payer: Self-pay | Admitting: Surgery

## 2015-08-09 DIAGNOSIS — Z515 Encounter for palliative care: Secondary | ICD-10-CM

## 2015-08-09 LAB — TISSUE CULTURE: CULTURE: NO GROWTH

## 2015-08-09 LAB — GLUCOSE, CAPILLARY
Glucose-Capillary: 128 mg/dL — ABNORMAL HIGH (ref 65–99)
Glucose-Capillary: 90 mg/dL (ref 65–99)

## 2015-08-09 MED ORDER — SODIUM CHLORIDE 0.9 % IV SOLN
1.5000 mg/h | INTRAVENOUS | Status: DC
  Administered 2015-08-09 – 2015-08-10 (×2): 1 mg/h via INTRAVENOUS
  Administered 2015-08-11 – 2015-08-13 (×2): 1.5 mg/h via INTRAVENOUS
  Filled 2015-08-09 (×4): qty 5

## 2015-08-09 MED ORDER — HALOPERIDOL LACTATE 5 MG/ML IJ SOLN: 0.5000 mg | INTRAMUSCULAR | Status: DC | PRN

## 2015-08-09 MED ORDER — ACETAMINOPHEN 325 MG PO TABS: 650.0000 mg | ORAL_TABLET | Freq: Four times a day (QID) | ORAL | Status: DC | PRN

## 2015-08-09 MED ORDER — SODIUM CHLORIDE 0.9 % IV SOLN
INTRAVENOUS | Status: DC
  Administered 2015-08-09 – 2015-08-12 (×2): via INTRAVENOUS

## 2015-08-09 MED ORDER — SENNA 8.6 MG PO TABS: 1.0000 | ORAL_TABLET | Freq: Every evening | ORAL | Status: DC | PRN

## 2015-08-09 MED ORDER — ONDANSETRON 4 MG PO TBDP
4.0000 mg | ORAL_TABLET | Freq: Four times a day (QID) | ORAL | Status: DC | PRN
  Filled 2015-08-09: qty 1

## 2015-08-09 MED ORDER — BIOTENE DRY MOUTH MT LIQD: 15.0000 mL | OROMUCOSAL | Status: DC | PRN

## 2015-08-09 MED ORDER — DIPHENHYDRAMINE HCL 50 MG/ML IJ SOLN
12.5000 mg | INTRAMUSCULAR | Status: DC | PRN
  Administered 2015-08-12: 12.5 mg via INTRAVENOUS
  Filled 2015-08-09: qty 1

## 2015-08-09 MED ORDER — GLYCOPYRROLATE 1 MG PO TABS
1.0000 mg | ORAL_TABLET | ORAL | Status: DC | PRN
  Filled 2015-08-09: qty 1

## 2015-08-09 MED ORDER — ONDANSETRON HCL 4 MG/2ML IJ SOLN
4.0000 mg | Freq: Four times a day (QID) | INTRAMUSCULAR | Status: DC | PRN
Start: 2015-08-09 — End: 2015-08-13

## 2015-08-09 MED ORDER — GLYCOPYRROLATE 0.2 MG/ML IJ SOLN
0.2000 mg | INTRAMUSCULAR | Status: DC | PRN
  Administered 2015-08-10: 0.2 mg via INTRAVENOUS
  Filled 2015-08-09: qty 1

## 2015-08-09 MED ORDER — HALOPERIDOL 1 MG PO TABS: 0.5000 mg | ORAL_TABLET | ORAL | Status: DC | PRN

## 2015-08-09 MED ORDER — HYDROMORPHONE BOLUS VIA INFUSION
2.0000 mg | INTRAVENOUS | Status: DC | PRN
  Administered 2015-08-09 – 2015-08-13 (×28): 2 mg via INTRAVENOUS
  Filled 2015-08-09 (×29): qty 2

## 2015-08-09 MED ORDER — ONDANSETRON HCL 4 MG/2ML IJ SOLN
4.0000 mg | Freq: Three times a day (TID) | INTRAMUSCULAR | Status: DC
  Administered 2015-08-09 – 2015-08-13 (×12): 4 mg via INTRAVENOUS
  Filled 2015-08-09 (×12): qty 2

## 2015-08-09 MED ORDER — GLYCOPYRROLATE 0.2 MG/ML IJ SOLN
0.2000 mg | INTRAMUSCULAR | Status: DC | PRN
  Filled 2015-08-09 (×2): qty 1

## 2015-08-09 MED ORDER — ACETAMINOPHEN 650 MG RE SUPP: 650.0000 mg | Freq: Four times a day (QID) | RECTAL | Status: DC | PRN

## 2015-08-09 MED ORDER — POLYVINYL ALCOHOL 1.4 % OP SOLN
1.0000 [drp] | Freq: Four times a day (QID) | OPHTHALMIC | Status: DC | PRN
  Filled 2015-08-09: qty 15

## 2015-08-09 MED ORDER — HALOPERIDOL LACTATE 2 MG/ML PO CONC
0.5000 mg | ORAL | Status: DC | PRN
  Filled 2015-08-09: qty 0.3

## 2015-08-09 NOTE — Progress Notes (Signed)
Daily Progress Note   Patient Name: Cynthia Bowen       Date: 08/09/2015 DOB: Aug 16, 1945  Age: 70 y.o. MRN#: [id number] Attending Physician: Cynthia Mitchell, MD Primary Care Physician: Cynthia Koyanagi, MD Admit Date: 08/05/2015  Reason for Consultation/Follow-up: Establishing goals of care, Pain control and Terminal Care  Length of Stay: 4 days   Palliative Performance Scale: 20%     Vital Signs: BP 156/84 mmHg  Pulse 123  Temp(Src) 98.4 F (36.9 C) (Axillary)  Resp 12  Ht _0  (1.651 m)  Wt 37 kg (81 lb 9.1 oz)  BMI 13.57 kg/m2  SpO2 100% SpO2: SpO2: 100 % O2 Device: O2 Device: Not Delivered O2 Flow Rate: O2 Flow Rate (L/min): 1 L/min  Intake/output summary:  Intake/Output Summary (Last 24 hours) at 08/09/15 1258 Last data filed at 08/09/15 1200  Gross per 24 hour  Intake 2778.33 ml  Output   3175 ml  Net -396.67 ml   LBM:   Baseline Weight: Weight: 37 kg (81 lb 9.1 oz) Most recent weight: Weight: 37 kg (81 lb 9.1 oz)  Physical Exam: Frail, minimally interactive [place]         Additional Data Reviewed: Recent Labs     08/07/15  0333  08/08/15  0410  WBC  13.3*  12.9*  HGB  8.2*  8.0*  PLT  105*  118*  NA  137  137  BUN  <5*  <5*  CREATININE  0.45  0.38*     Problem List:  Patient Active Problem List   Diagnosis Date Noted  . Ischemic leg 08/06/2015  . Occlusion of femoral-popliteal bypass graft ([place]) 08/05/2015  . Atherosclerosis of native arteries of the extremities with ulceration ([place]) 06/17/2015  . Essential hypertension 06/12/2015  . COPD exacerbation ([place]) 06/09/2015  . Leukocytosis 06/08/2015  . SBO (small bowel obstruction) ([place]) 06/07/2015  . Intussusception intestine ([place]) 06/07/2015  . Long term (current) use of anticoagulants [Z79.01] 05/03/2015  . Enteritis due to Clostridium difficile 02/16/2015  . Ankle fracture, right 02/03/2015  . Abnormal finding on mammography 12/31/2014  . Groin abscess 12/30/2014  . Groin pain  12/29/2014  . Chronic pain associated with significant psychosocial dysfunction 12/15/2014  . Wound drainage-Right groin 11/15/2014  . Ischemia of lower extremity 09/05/2014  . Abdominal aortic stenosis 09/03/2014  . PAD (peripheral artery disease) ([place]) 08/26/2014  . Discoloration of skin- Left Heel 08/13/2014  . Left foot pain- Heel 08/13/2014  . Leg heaviness-Bilateral 08/13/2014  . Protein-calorie malnutrition, severe ([place]) 06/04/2014  . Cellulitis of hand 06/02/2014  . Hypokalemia 06/02/2014  . Osteoporosis 05/22/2014  . Recurrent abdominal pain--multiple causes, status post GI surgery with multiple complications 4827 07/86/7544  . Abnormal CT scan 02/08/2014  . Atherosclerosis of native arteries of extremity with intermittent claudication ([place]) 01/15/2014  . Nephrolithiasis-asymptomatic/noted on scan 10/08/2013  . Other and unspecified ovarian cyst--noted on scans 2014 2015/asymptomatic 10/08/2013  . Pancreatic lesion-6 mm noted on [facility] at [facility] 10/08/2013  . Femoral artery stenosis-bilateral noted CT at [facility] 2015 10/08/2013  . History of pulmonary embolism 08/05/2012  . Incarcerated epigastric hernia s/p primary repair 07/02/2012 07/11/2012  . 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 [id number] 07/10/2012  . Pericardial effusion 04/17/2012  . COPD GOLD I 01/29/2012  . Back pain DDD S/p surgery [place] 1993 12/29/2011  . Depression with anxiety 04/02/2011  . GERD (gastroesophageal reflux disease) 04/02/2011  . Osteoarthritis 04/02/2011  . Cervical neck pain with  evidence of disc disease-s/p surgery [id number] 04/02/2011  . Fibromyalgia-[place] 2004 04/02/2011     Palliative Care Assessment & Plan   70 yo with ischemic limb and sepsis with VDRF.  I met with Cynthia Bowen, her brother, sister and grandson to discuss goals of care. They shared with me the suffering Cynthia Bowen has been through over the past three years and they all  feel that the most compassionate and humane thing to do for her is to allow for a peaceful, natural death to occur with full comfort care. They are extremely spiritual and Cynthia Bowen is able to nod yes that she is ready to be out of pain- even if that requires deep sedation and that she may die of her infection.   Transfer to [facility] Only until [facility] bed is available  Decision confirmed for comfort care. Recommended Hospice Facility-will have complex pain management needs.  Code Status:  DNR  Goals of Care:  Full Comfort   Allow for a natural death to occur  To no longer have aggressive or painful medical interventions including surgery or lab draws.  To have compassionate care 24/7 and pain control.  Symptom Management:  Start Dilaudid infusion with bolus dosing- will titrate  Palliative Standing orders and prophylaxis  Palliative Prophylaxis:  EOL order set  Psycho-social/Spiritual:  Desire for further Chaplaincy support:yes   Prognosis: < 2 weeks Discharge Planning: Hospice facility ONLY- transfer to 6N until hospice bed available- will continue to titrate infusion for pain.   Care plan was discussed with family present in the room.  Thank you for allowing the Palliative Medicine Team to assist in the care of this patient.   Time In: 12:20 Time Out: 1:10 Total Time 50 Prolonged Time Billed Yes    Greater than 50%  of this time was spent counseling and coordinating care related to the above assessment and plan.   Cynthia Chain, DO  08/09/2015, 12:58 PM  Please contact Palliative Medicine Team phone at [phone] for questions and concerns.

## 2015-08-09 NOTE — Progress Notes (Signed)
Nutrition Brief Note  Chart reviewed. Pt now transitioning to comfort care.  No further nutrition interventions warranted at this time.  Please re-consult as needed.   Sameera Betton A. Zareya Tuckett, RD, LDN, CDE Pager: [phone] After hours Pager: [phone]  

## 2015-08-09 NOTE — Progress Notes (Signed)
I stopped by to visit with Ms. Morsch this evening.  She remains non-participative in conversations.  I reached out to her sister again, and we had a long conversation via phone about pathways moving forward.  Her sister reports that she does not think that her sister will do well with further interventions, and she really does not see this as a pathway toward her sister ever getting well enough to enjoy a quality of life that she would find acceptable.  She told me this evening that she does not think that her sister would want to proceed with amputation, even if this meant that she would die.  We discussed a little about what comfort care moving forward would look like if this is the route that they choose.  She would like to meet in person tomorrow to discuss further.  I have set up a follow-up meeting tomorrow (4/18) at 1PM.  I will not be on service beginning tomorrow, and Rhea Pink, DO from our team will be attending meeting with family.  Micheline Rough, MD [facility] Team [phone]

## 2015-08-09 NOTE — Progress Notes (Addendum)
  [facility] Progress Note  Patient remains quiet when asked questions. She does nod her head when asked if she is in pain. Left leg is cool until distal thigh. Palliative care meeting with family today at 40. Comfort care versus surgery to be discussed at that time.   [name], PA-C [facility] Office: [phone] Pager: [phone] 08/09/2015 8:10 AM    I agree with the above.  Palliative care meeting scheduled for today at 1:00.  Direction of care will be dependent on this meeting.  Options include high left above-knee amputation versus palliative care.  Cynthia Bowen

## 2015-08-09 NOTE — Plan of Care (Signed)
Attempted to call report to 6N.

## 2015-08-09 NOTE — Progress Notes (Signed)
Pt transferred from [place] this pm. Palliative care orders in place . Dilaudid drip running at 2mg /hr. Pt alert and orientated x4

## 2015-08-09 NOTE — Progress Notes (Signed)
Chaplain presented to the patient's room to provide spiritual care support.  The patient had just arrived to this room after transfer from 2S.  Chaplain introduced self and inquired on the patient's wellbeing, she just nodded her head, but did not speak on either time conversation was initiated by the [place]. There was no family present at the time of this visit, but Chaplain will continue to follow up with patient and her family as needed. Chaplain Yaakov Guthrie [phone]

## 2015-08-09 NOTE — Plan of Care (Signed)
Transferred to 6 N 27 via 2 NT's via bed.

## 2015-08-09 NOTE — Plan of Care (Signed)
Report called to 6N RN.

## 2015-08-09 NOTE — Plan of Care (Signed)
Problem: Phase I Progression Outcomes Goal: Graft patent without s/s of occlusion Outcome: Not Progressing No pulses in left leg Goal: Tolerates diet without nausea/vomiting Outcome: Not Progressing Will not eat

## 2015-08-09 NOTE — Plan of Care (Signed)
Sister notified of transfer.

## 2015-08-09 NOTE — Care Management Important Message (Signed)
Important Message  Patient Details  Name: Cynthia Bowen MRN: [id number] Date of Birth: Nov 14, 1945   Medicare Important Message Given:  Yes    Wylie Coon [place] 08/09/2015, 11:00 AM

## 2015-08-09 NOTE — Plan of Care (Signed)
Dr. Trula Slade notified of pt's DNR status per Dr. Hilma Favors, requests 6N bed

## 2015-08-10 MED ORDER — DEXTROSE 5 % IV SOLN: 1.5000 g | INTRAVENOUS | Status: DC

## 2015-08-10 MED ORDER — CHLORHEXIDINE GLUCONATE CLOTH 2 % EX PADS: 6.0000 | MEDICATED_PAD | Freq: Once | CUTANEOUS | Status: DC

## 2015-08-10 MED ORDER — CEFAZOLIN SODIUM 1-5 GM-% IV SOLN: 1.0000 g | Freq: Once | INTRAVENOUS | Status: DC

## 2015-08-10 MED ORDER — SODIUM CHLORIDE 0.9 % IV SOLN: INTRAVENOUS | Status: DC

## 2015-08-10 NOTE — Clinical Social Work Note (Addendum)
CSW received referral that patient's family would like [facility] for residential hospice.  CSW contacted [employer] who will review patient to determine if she is eligible.  CSW spoke to patient's daughter Cynthia Bowen [phone] who confirmed they would like [facility].  CSW to continue to follow patient's progress.  Jones Broom. [place], MSW, [place] 08/10/2015 5:16 PM

## 2015-08-10 NOTE — Progress Notes (Signed)
Patient remains non-verbal Appreciate assistance from Palliative care Patient is comfort care    Cynthia Bowen

## 2015-08-11 LAB — ANAEROBIC CULTURE

## 2015-08-11 NOTE — Progress Notes (Signed)
Pt sitting up in bed crying.  Responded to yes and no questions briefly.  Will not answer me if she is in pain.  I spoke to the RN and she is bolused about every 4 hours when the pt starts crying.  I have asked if she can contact palliative care to see if there are further options for her to keep her comfortable.    Awaiting [facility] review.    RHYNE, SAMANTHA 08/11/2015 9:22 AM

## 2015-08-11 NOTE — Progress Notes (Signed)
RN spoke with Dr. Hilma Favors, Palliative regarding patient's pain regime.  Patient has a 1mg /hr continuous Dilaudid drip for pain control.  Patient appears comfortable, when she does have breakthrough pain she moans and cries, she is then given a 2mg  Bolus dose through her continuous Dilaudid drip. She settles right back down to a comfortable state after her Bolus doses, which she is receiving regularly every 3-4 hours.  Per Palliative and RN judgement, agreed upon increasing her Dilaudid infusion to 1.5mg /hr continuous, hoping to lessen the frequency of her breakthrough pain crying episodes.

## 2015-08-11 NOTE — Clinical Social Work Note (Addendum)
CSW received phone call from [facility], who stated they do not have a bed available today.  [facility] will let CSW know once bed has become available.  CSW updated palliative care, and case manager.  CSW attempted to call patient's sister Criselda Peaches to update her on bed availability, but CSW was not able to leave a message.     CSW to continue to follow patient's progress, gold DNR form on patient's chart awaiting signature by physician.  Jones Broom. [place], MSW, [place] 08/11/2015 10:59 AM

## 2015-08-11 NOTE — Care Management Note (Signed)
Case Management Note  Patient Details  Name: Cynthia Bowen MRN: [id number] Date of Birth: 03/14/1946  Subjective/Objective:                    Action/Plan:   Expected Discharge Date:                  Expected Discharge Plan:  [place]  In-House Referral:  Clinical Social Work  Discharge planning Services     Post Acute Care Choice:    Choice offered to:     DME Arranged:    DME Agency:     HH Arranged:    [place] Agency:     Status of Service:  In process, will continue to follow  Medicare Important Message Given:  Yes Date Medicare IM Given:    Medicare IM give by:    Date Additional Medicare IM Given:    Additional Medicare Important Message give by:     If discussed at [place] of Stay Meetings, dates discussed:    Additional Comments:  Marilu Favre, RN 08/11/2015, 3:34 PM

## 2015-08-11 NOTE — Progress Notes (Signed)
PMT RN: Pt is requiring bolus dose of 2 mg approx every 6 hours. Nurse describes significant pain with loud verblaizations before bolus is given. Pt is getting 24 mg/day basal plus four 2mg  bolus doses/24 hour, to total 32mg /24 hours. Obtained order to increase basal to better manage her BTP, especially in the light of the pt's clearly stated goal of zero pain, even if that requires deep sedation.  Larina Earthly, RN, BSN, [facility] 08/11/2015 12:32 PM Cell [phone] 8:00-4:00 Monday-Friday Office [phone]

## 2015-08-11 NOTE — Progress Notes (Signed)
   Daily Progress Note  Assessment/Planning: POD #6 s/p TE L ax-PFA bypass   Awaiting hospice placement  Narcotic drip increased appropriately  Appreciate Palliative care's assistance  Subjective  - 6 Days Post-Op  No obvious pain, confused  Objective Filed Vitals:   08/10/15 0525 08/10/15 1320 08/11/15 0533 08/11/15 1415  BP: 124/85 148/70 173/76 143/72  Pulse: 107 98 112 101  Temp: 97.6 F (36.4 C) 97.7 F (36.5 C) 98.4 F (36.9 C) 98.5 F (36.9 C)  TempSrc: Axillary Axillary Oral Axillary  Resp: 16 16 16 17   Height:      Weight:      SpO2: 100% 100% 98% 100%    Intake/Output Summary (Last 24 hours) at 08/11/15 1914 Last data filed at 08/11/15 1648  Gross per 24 hour  Intake  287.4 ml  Output   1125 ml  Net -837.6 ml   GEN no able to hold conversation, confused  Laboratory CBC    Component Value Date/Time   WBC 12.9* 08/08/2015 0410   WBC 7.4 07/18/2015 1811   WBC 8.2 08/26/2012 1300   HGB 8.0* 08/08/2015 0410   HGB 12.0* 07/18/2015 1811   HGB 10.6* 08/26/2012 1300   HCT 24.3* 08/08/2015 0410   HCT 34.2* 07/18/2015 1811   HCT 31.2* 08/26/2012 1300   PLT 118* 08/08/2015 0410   PLT 516* 08/26/2012 1300    BMET    Component Value Date/Time   NA 137 08/08/2015 0410   NA 138 08/26/2012 1300   K 3.8 08/08/2015 0410   K 4.0 08/26/2012 1300   CL 99* 08/08/2015 0410   CL 108* 08/26/2012 1300   CO2 29 08/08/2015 0410   CO2 24 08/26/2012 1300   GLUCOSE 120* 08/08/2015 0410   GLUCOSE 83 08/26/2012 1300   BUN <5* 08/08/2015 0410   BUN 29* 08/26/2012 1300   BUN 4 08/19/2009   CREATININE 0.38* 08/08/2015 0410   CREATININE 0.64 07/29/2015 1555   CREATININE 0.79 08/26/2012 1300   CALCIUM 8.5* 08/08/2015 0410   CALCIUM 9.9 08/26/2012 1300   GFRNONAA >60 08/08/2015 0410   GFRNONAA >89 06/06/2015 1419   GFRNONAA >60 08/26/2012 1300   GFRAA >60 08/08/2015 0410   GFRAA >89 06/06/2015 1419   GFRAA >60 08/26/2012 [place], MD [facility] [place] Office: [phone] Pager: [phone]  08/11/2015, 7:14 PM

## 2015-08-12 ENCOUNTER — Telehealth: Payer: Self-pay | Admitting: Internal Medicine

## 2015-08-12 NOTE — Telephone Encounter (Signed)
Faxed signed home health orders r/t PT/INR and warfarin

## 2015-08-12 NOTE — Progress Notes (Signed)
   Daily Progress Note  Assessment/Planning: POD #7 s/p TE L ax-PFA bypass   Awaiting hospice placement  Subjective  - 7 Days Post-Op  Confused, no obvious grimace  Objective Filed Vitals:   08/10/15 1320 08/11/15 0533 08/11/15 1415 08/12/15 0518  BP: 148/70 173/76 143/72 156/68  Pulse: 98 112 101 108  Temp: 97.7 F (36.5 C) 98.4 F (36.9 C) 98.5 F (36.9 C) 98 F (36.7 C)  TempSrc: Axillary Oral Axillary Axillary  Resp: 16 16 17 16   Height:      Weight:      SpO2: 100% 98% 100% 100%    Intake/Output Summary (Last 24 hours) at 08/12/15 1745 Last data filed at 08/12/15 1726  Gross per 24 hour  Intake    837 ml  Output   1550 ml  Net   -713 ml    GEN confused, no obvious grimace  Laboratory CBC    Component Value Date/Time   WBC 12.9* 08/08/2015 0410   WBC 7.4 07/18/2015 1811   WBC 8.2 08/26/2012 1300   HGB 8.0* 08/08/2015 0410   HGB 12.0* 07/18/2015 1811   HGB 10.6* 08/26/2012 1300   HCT 24.3* 08/08/2015 0410   HCT 34.2* 07/18/2015 1811   HCT 31.2* 08/26/2012 1300   PLT 118* 08/08/2015 0410   PLT 516* 08/26/2012 1300    BMET    Component Value Date/Time   NA 137 08/08/2015 0410   NA 138 08/26/2012 1300   K 3.8 08/08/2015 0410   K 4.0 08/26/2012 1300   CL 99* 08/08/2015 0410   CL 108* 08/26/2012 1300   CO2 29 08/08/2015 0410   CO2 24 08/26/2012 1300   GLUCOSE 120* 08/08/2015 0410   GLUCOSE 83 08/26/2012 1300   BUN <5* 08/08/2015 0410   BUN 29* 08/26/2012 1300   BUN 4 08/19/2009   CREATININE 0.38* 08/08/2015 0410   CREATININE 0.64 07/29/2015 1555   CREATININE 0.79 08/26/2012 1300   CALCIUM 8.5* 08/08/2015 0410   CALCIUM 9.9 08/26/2012 1300   GFRNONAA >60 08/08/2015 0410   GFRNONAA >89 06/06/2015 1419   GFRNONAA >60 08/26/2012 1300   GFRAA >60 08/08/2015 0410   GFRAA >89 06/06/2015 1419   GFRAA >60 08/26/2012 [place], MD [facility] [place]: [phone] Pager: [phone]  08/12/2015,  5:45 PM

## 2015-08-12 NOTE — Care Management Important Message (Signed)
Important Message  Patient Details  Name: Cynthia Bowen MRN: [id number] Date of Birth: 1945-07-30   Medicare Important Message Given:  Yes    Barb Merino Sylvan Sookdeo 08/12/2015, 1:38 PM

## 2015-08-12 NOTE — Clinical Social Work Note (Signed)
[facility] still does not have any beds available for patient.  Patient's family are not agreeable to going to a different hospice facility.  CSW continuing to follow patient's progress.  Jones Broom. Biddle, MSW, [place] 08/12/2015 10:19 AM

## 2015-08-13 MED ORDER — OXYCODONE-ACETAMINOPHEN 5-325 MG PO TABS: 1.0000 | ORAL_TABLET | ORAL | Status: AC | PRN

## 2015-08-13 MED ORDER — SODIUM CHLORIDE 0.9% FLUSH: 10.0000 mL | INTRAVENOUS | Status: DC | PRN

## 2015-08-13 NOTE — Progress Notes (Signed)
Palliative Medicine RN Note: Pt has bed at [facility]. Signed DNR gold form on chart. Spoke with OR circulator; on call MD Fields is operating; relayed need for d/c summary to be done so pt can be transferred out. She will let MD know. They have contact info for PMT, as does Museum/gallery conservator.  Larina Earthly, RN, BSN, [facility] 08/13/2015 11:53 AM Cell [phone] 8:00-4:00 Monday-Friday Office [phone]

## 2015-08-13 NOTE — Discharge Summary (Signed)
[facility] Discharge Summary   Patient ID:  Cynthia Bowen MRN: [id number] DOB/AGE: 06-08-1945 70 y.o.  Admit date: 08/05/2015 Discharge date: 08/13/2015 Date of Surgery: 08/05/2015 - 08/06/2015 Surgeon: Juliann Mule): Serafina Mitchell, MD  Admission Diagnosis: Limb ischemia [I99.8]  Discharge Diagnoses:  Limb ischemia [I99.8]  Secondary Diagnoses: Past Medical History  Diagnosis Date  . COPD (chronic obstructive pulmonary disease) ([place])   . Pneumonia 12-2011  . GERD (gastroesophageal reflux disease)   . Headache(784.0)   . Arthritis     osteoarthritis  . Allergy   . Depression   . Neuromuscular disorder ([place])   . Osteoporosis   . Bronchitis     CURRENTLY AS OF 06/30/12 - HAS COUGH AND FINISHED ANTIBIOTIC FOR BRONCHITIS  . Fibromyalgia   . Pain     ABDOMINAL PAIN AND NAUSEA  . Pain     SOMETIMES PAIN RIGHT EAR AND NECK--STATES CAUSED BY A "LUMP" ON BACK OF EAR--USES KENALOG CREAM TOPICALLY AS NEEDED.  Marland Kitchen Gastrocutaneous fistula   . Anemia   . Anxiety   . Blood transfusion without reported diagnosis   . Heart murmur     young  . Peripheral vascular disease (H[facility])     hx  ?leg  . History of kidney stones   . Pulmonary embolism ([place])     Procedure(s): Thrombectomy Left Axillary-Bi-Femoral Bypass Graft; Redo Femoral Exposure  Discharged Condition: serious  HPI:  This is a 70 year old female who is a patient of Dr. Bridgett Larsson. She has had the following procedures: 1. 5/5/16Aortic stenting with endograft, PTA+S L CIA 2. 5/15/16R EIA and CFA EA w/ Dacron patch angioplasty, R to L fem-fem BPG 3. 12/30/14 I&D B groin abscesses 4. 9/9/16Exc infected fem-fem BPG, B CFA BPA, L ax-PFA BPG 5. 9/20/16Drainage L axilla seroma  She also has a chronic right foot ulcer and has been scheduled for a right femoral to below-knee popliteal artery bypass graft later this month. Unfortunately around 5:00 this evening she began  complaining of pain in her left groin and left leg. She came straight to the emergency department. Of note she is on Coumadin and her last INR was 4 and therefore her Coumadin has been on hold. Her INR was 2.0 in the emergency department.  The patient suffers from COPD. She is a former smoker. In February of this year she underwent exploratory laparotomy for small bowel obstruction. She is found to have intussusception and underwent resection.  Hospital Course:  Cynthia Bowen is a 70 y.o. female is S/P Procedure(s): 08/06/2015 Thrombectomy Left Axillary-Bi-Femoral Bypass Graft; Redo Femoral Exposure  POD# 1   The patient's left axillary profunda bypass graft has reoccluded. 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 In addition she most likely occluded her bypass graft because of poor outflow. I would not want to proceed with a outflow procedure in the setting of possible infection. She has a high risk for needing amputation of the left leg. We will continue to let anesthesia and narcotics were off with plans for extubation and clinical evaluation of her left leg. I will not restart her anticoagulation at this time, as she will likely need another surgery in the immediate future.  The patient had an inadvertent cannulation of the carotid artery during central line placement by anesthesia. She did have a hematoma here and possible tracheal deviation, therefore we decided to keep her intubated and returned to the  ICU. The hematoma was not expanding and did appear stable. She was hemodynamically stable. " Patient's family at bedside. She is currently extubated and complaining of significant pain throughout the left leg.  Decreased motor function of the left leg is now present. She does state that she can still feel her foot. She has difficulty moving her toes.  I had an extensive  conversation with the patient and her family. I discussed that I feel her bypass occluded for 2 reasons. #1, because the patient has a history of removal of a femoral-femoral bypass graft for infection at the time the left axillary profunda graft was placed, and with the intraoperative finding that the graft was not incorporated makes me believe that this graft was infected as part of the reason for its failure. #2, I think the patient has very poor runoff which also contributed to bypass graft failure.  i do not think the patient is a candidate for a repeat exploration. I do not think removing the axillary profunda graft and placing another conduitin addition to a below knee popliteal bypass graft is a reasonable alternative. I think it would carry a high morbidity and likely would have a similar outcome. I have reviewed her intraoperative images from over a year ago which are the only images of the left leg I can locate. Her superficial femoral and above-knee popliteal artery are occluded. There is a below knee popliteal artery. Runoff of these images was very restricted it appears to be a very diseased single-vessel. She is likely had progression of her tibial disease which would potentially make her unreconstructable.  I discussed with the patient and family that I think she is going to require an amputation. Unfortunately this Has the possibility of being a hip disarticulation, as she has having pain all the way up to her groin. Before doing this, she will need to have her anticoagulation corrected. We will focus on pain management as this is a major issue currently. I'm also going to consult palliative care to help facilitate some of these challenging issues.  Annamarie Major          Palliative care consult: SUMMARY OF RECOMMENDATIONS - She currently reports her pain is under control. I discussed at length with her bedside nurse who reports she has intermittent severe pain. This  does seem to improve whenever she receives 2 mg of IV Dilaudid. - My initial thought would be to initiate a PCA. However, after meeting with Cynthia Bowen and speaking with her nurse, I'm concerned about her ability to effectively utilize PCA. She did receive Ativan prior to our encounter which may have contributed to her not really wanting to participate in conversation. At the same time, she did appear comfortable and I would continue this as needed. I would recommend continuing with current regimen and will follow up daily to continue to assess if she may benefit from continuous infusion or PCA. - We'll continue to work in conjunction with other members of her care team to control pain as well as possible understanding that ischemic pain remains difficult to manage even with high doses of opioid medications. - Family not present during encounter today. I will call them to try and set up a family meeting either tomorrow or Monday in order to further discuss short and long-term goals of care.  Plan discharge to Hospice for comfort care.   Significant Diagnostic Studies: CBC Lab Results  Component Value Date   WBC 12.9* 08/08/2015   HGB 8.0*  08/08/2015   HCT 24.3* 08/08/2015   MCV 86.5 08/08/2015   PLT 118* 08/08/2015    BMET    Component Value Date/Time   NA 137 08/08/2015 0410   NA 138 08/26/2012 1300   K 3.8 08/08/2015 0410   K 4.0 08/26/2012 1300   CL 99* 08/08/2015 0410   CL 108* 08/26/2012 1300   CO2 29 08/08/2015 0410   CO2 24 08/26/2012 1300   GLUCOSE 120* 08/08/2015 0410   GLUCOSE 83 08/26/2012 1300   BUN <5* 08/08/2015 0410   BUN 29* 08/26/2012 1300   BUN 4 08/19/2009   CREATININE 0.38* 08/08/2015 0410   CREATININE 0.64 07/29/2015 1555   CREATININE 0.79 08/26/2012 1300   CALCIUM 8.5* 08/08/2015 0410   CALCIUM 9.9 08/26/2012 1300   GFRNONAA >60 08/08/2015 0410   GFRNONAA >89 06/06/2015 1419   GFRNONAA >60 08/26/2012 1300   GFRAA >60 08/08/2015 0410   GFRAA >89  06/06/2015 1419   GFRAA >60 08/26/2012 1300   COAG Lab Results  Component Value Date   INR 1.07 08/08/2015   INR 1.35 08/07/2015   INR 2.71* 08/06/2015     Disposition:  Discharge to :hospice care    Medication List    ASK your doctor about these medications        acetaminophen 500 MG tablet  Commonly known as:  TYLENOL  Take 1 tablet (500 mg total) by mouth every 8 (eight) hours as needed for mild pain or moderate pain. Reported on 05/20/2015     calcium gluconate 500 MG tablet  Take 1 tablet by mouth 3 (three) times daily.     dicyclomine 10 MG capsule  Commonly known as:  BENTYL  TAKE TWO CAPSULES (20MG ) 4 TIMES A DAY (BEFORE MEALS AND AT BEDTIME)     docusate sodium 100 MG capsule  Commonly known as:  COLACE  Take 1 capsule (100 mg total) by mouth 2 (two) times daily.     feeding supplement Liqd  Take 1 Container by mouth 3 (three) times daily between meals.     fluocinonide cream 0.05 %  Commonly known as:  LIDEX  Apply 1 application topically 2 (two) times daily as needed. Reported on 05/05/2015     ketotifen 0.025 % ophthalmic solution  Commonly known as:  ZADITOR  Place 1 drop into both eyes 2 (two) times daily.     multivitamin with minerals Tabs tablet  Take 1 tablet by mouth daily.     mupirocin ointment 2 %  Commonly known as:  BACTROBAN  Place 1 application into the nose 2 (two) times daily.     ondansetron 4 MG tablet  Commonly known as:  ZOFRAN  Take 4 mg by mouth every 8 (eight) hours as needed for nausea or vomiting.     oxyCODONE-acetaminophen 5-325 MG tablet  Commonly known as:  PERCOCET/ROXICET  Take 1-2 tablets by mouth every 4 (four) hours as needed for moderate pain.     polyethylene glycol powder powder  Commonly known as:  GLYCOLAX/MIRALAX  MIX 17 G (1 CAPFUL) IN 8 OZ OF LIQUID 2 TIMES A DAY AS NEEDED     pregabalin 50 MG capsule  Commonly known as:  LYRICA  Take 1 capsule (50 mg total) by mouth 3 (three) times daily. Reported  on 07/12/2015     PROBIOTIC DAILY PO  Take 1 capsule by mouth daily.     ranitidine 150 MG tablet  Commonly known as:  ZANTAC  Take 1  tablet (150 mg total) by mouth 2 (two) times daily.     vitamin C 500 MG tablet  Commonly known as:  ASCORBIC ACID  Take 500 mg by mouth daily. Reported on 07/12/2015     Vitamin D (Ergocalciferol) 50000 units Caps capsule  Commonly known as:  DRISDOL  Take 1 capsule (50,000 Units total) by mouth every 7 (seven) days.     warfarin 5 MG tablet  Commonly known as:  COUMADIN  Take one tablet by mouth daily except 1/2 tablet on Mon. & Fri. Or as directed by the Coumad[facility] Clinic     zinc gluconate 50 MG tablet  Take 50 mg by mouth daily.       Verbal and written Discharge instructions given to the patient. Wound care per Discharge AVS   Signed: Laurence Slate [facility] 08/13/2015, 12:52 PM  Addendum  I have independently interviewed and examined the patient, and I agree with the physician assistant's discharge summary.  This admission she presented with possibly infected left axilloprofunda bypass.  She has previously had great difficulty healing her prior bypasses due to extensive intestinal surgery necessary due to peptic ulcer disease with complications.  Her severe protein malnutrition has interfered with her healing.  Unfortunately recently she had an intussception requiring further intestinal resection.  She was admitted by my partner Dr. Trula Slade, who complete a thrombectomy on her axilloprofunda graft.  Unfortunately, this was no adequate to help with distal foot symptoms.  She elected to pursue comport measures.  He hospitalization period was noteably for further titration of her pain regimen while waiting on an outpatient hospice bed.    Adele Barthel, MD [facility] [place] Office: [phone] Pager: [phone]  08/16/2015, 7:40 AM

## 2015-08-13 NOTE — Progress Notes (Signed)
Dr. Bridgett Larsson paged to sign gold DNR form and for DC summary for pt to go to [facility] place.

## 2015-08-13 NOTE — Clinical Social Work Note (Addendum)
CSW was informed by [facility] that patient has a bed available for discharge today.  CSW informed family and bedside nurse who will contact physician to complete discharge summary.  Patient to be d/c'ed today to [facility].  Patient and family agreeable to plans will Transport via ems RN to call report.  Jones Broom. Slater, MSW, [place] 08/13/2015 11:40 AM

## 2015-08-13 NOTE — Progress Notes (Signed)
DC summary ready and gold form for patient transfer to [facility].

## 2015-08-13 NOTE — Progress Notes (Addendum)
      Pending Hospice placement No beds available to date. Comfort measures   Theda Sers, [place]  PA-C  Hospice bed available later today  Ruta Hinds, MD [facility] [place] Office: [phone] Pager: [phone]

## 2015-08-13 NOTE — Progress Notes (Signed)
[facility] here to transport pt to [facility]

## 2015-08-13 NOTE — Progress Notes (Signed)
Report given to Helene Kelp at [facility].

## 2015-08-13 NOTE — Consult Note (Signed)
[facility] available today. Met with patient's sister to complete registration paper work for transfer today. Dr. Orpah Melter to assume care per family request. Discharge summary has been faxed.  RN please call report to [phone].  Thank you.  Erling Conte, [place]

## 2015-08-16 ENCOUNTER — Inpatient Hospital Stay (HOSPITAL_COMMUNITY): Admission: RE | Admit: 2015-08-16 | Payer: Medicare Other | Source: Ambulatory Visit

## 2015-08-18 NOTE — Progress Notes (Signed)
Spoke with pts sister in regards to trying to attempt to reach pt with surgical time with surgery scheduled for 08/19/2015. Pts sister reported that pt had expired on 08/20/2015. This nurse LVMM with Pam Gibson/scheduler with Dr Karsten Ro in regards to situation.

## 2015-08-19 ENCOUNTER — Ambulatory Visit (HOSPITAL_COMMUNITY): Admission: RE | Admit: 2015-08-19 | Payer: Medicare Other | Source: Ambulatory Visit | Admitting: Urology

## 2015-08-19 ENCOUNTER — Encounter (HOSPITAL_COMMUNITY): Admission: RE | Payer: Self-pay | Source: Ambulatory Visit

## 2015-08-19 SURGERY — CYSTOURETEROSCOPY, WITH RETROGRADE PYELOGRAM AND STENT INSERTION
Anesthesia: General | Laterality: Right

## 2015-08-20 ENCOUNTER — Other Ambulatory Visit: Payer: Self-pay | Admitting: Internal Medicine

## 2015-08-22 DEATH — deceased

## 2015-08-24 ENCOUNTER — Encounter (HOSPITAL_COMMUNITY): Admission: RE | Payer: Self-pay | Source: Ambulatory Visit

## 2015-08-24 ENCOUNTER — Inpatient Hospital Stay (HOSPITAL_COMMUNITY): Admission: RE | Admit: 2015-08-24 | Payer: Medicare Other | Source: Ambulatory Visit | Admitting: Vascular Surgery

## 2015-08-24 SURGERY — BYPASS GRAFT FEMORAL-POPLITEAL ARTERY
Anesthesia: General | Site: Leg Lower | Laterality: Right

## 2015-10-08 ENCOUNTER — Other Ambulatory Visit: Payer: Self-pay | Admitting: Internal Medicine

## 2017-04-25 IMAGING — MG MM DIGITAL SCREENING BILAT W/ CAD
5 series · 5 of 5 positions shown · non-contrast
Comparison: Previous exam(s).

CLINICAL DATA: Screening.

EXAM:
DIGITAL SCREENING BILATERAL MAMMOGRAM WITH CAD

[R CC]
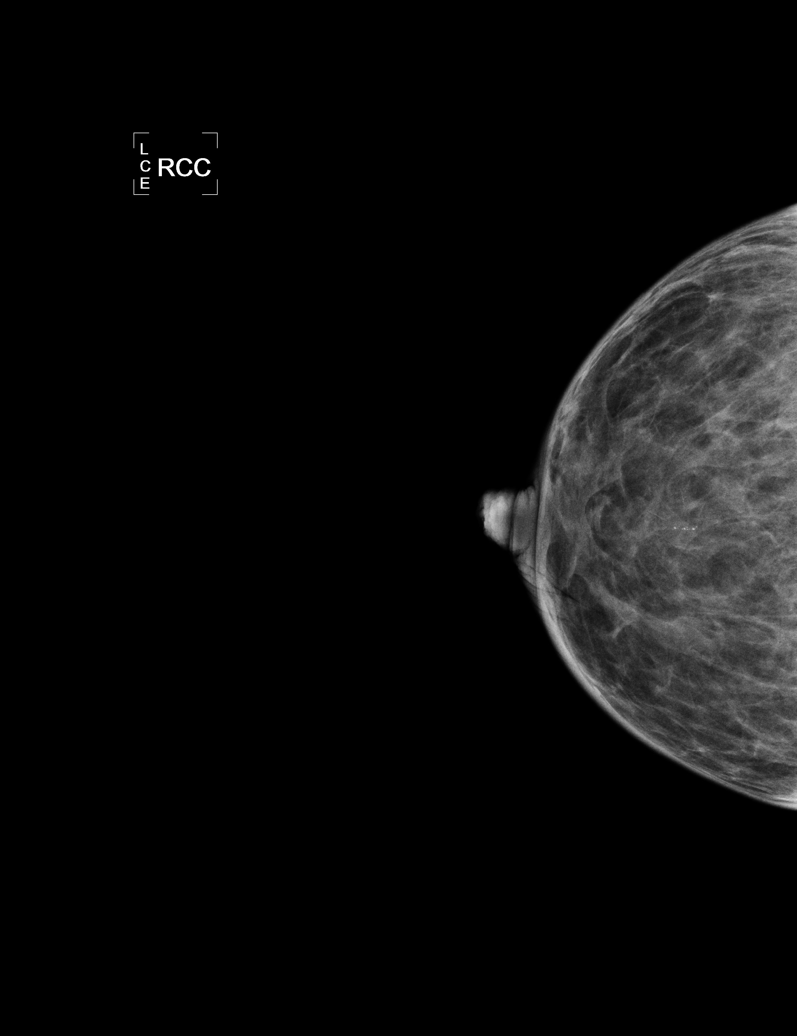

[L CC (1 of 2)]
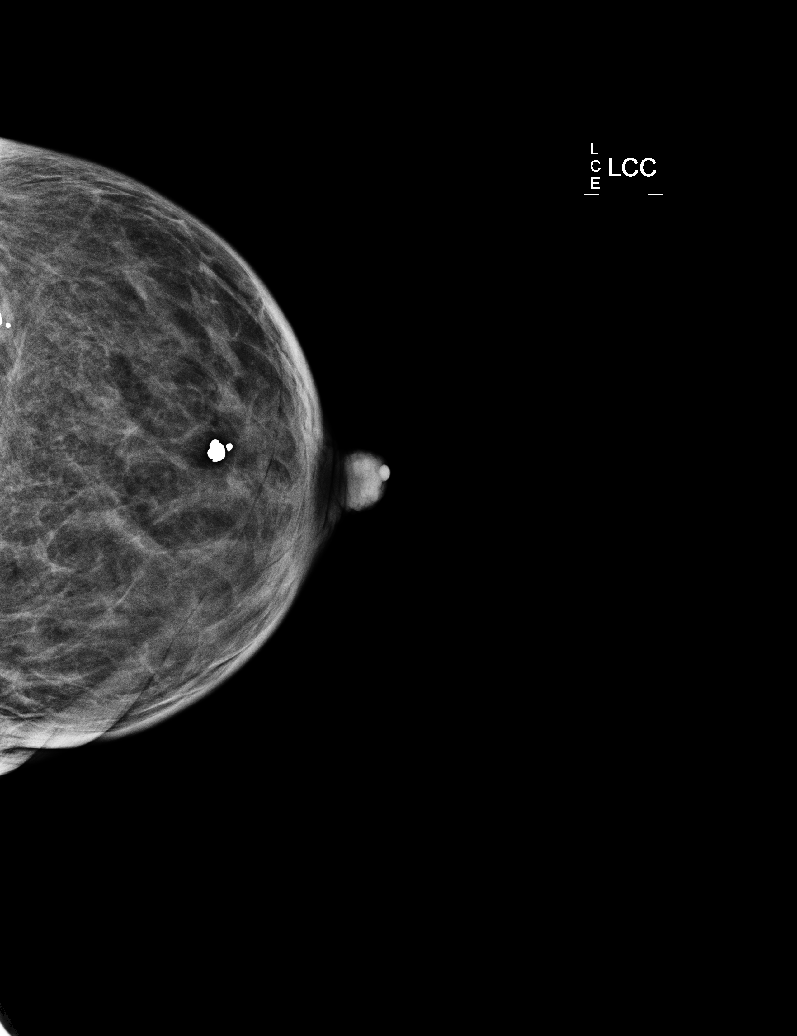

[L MLO]
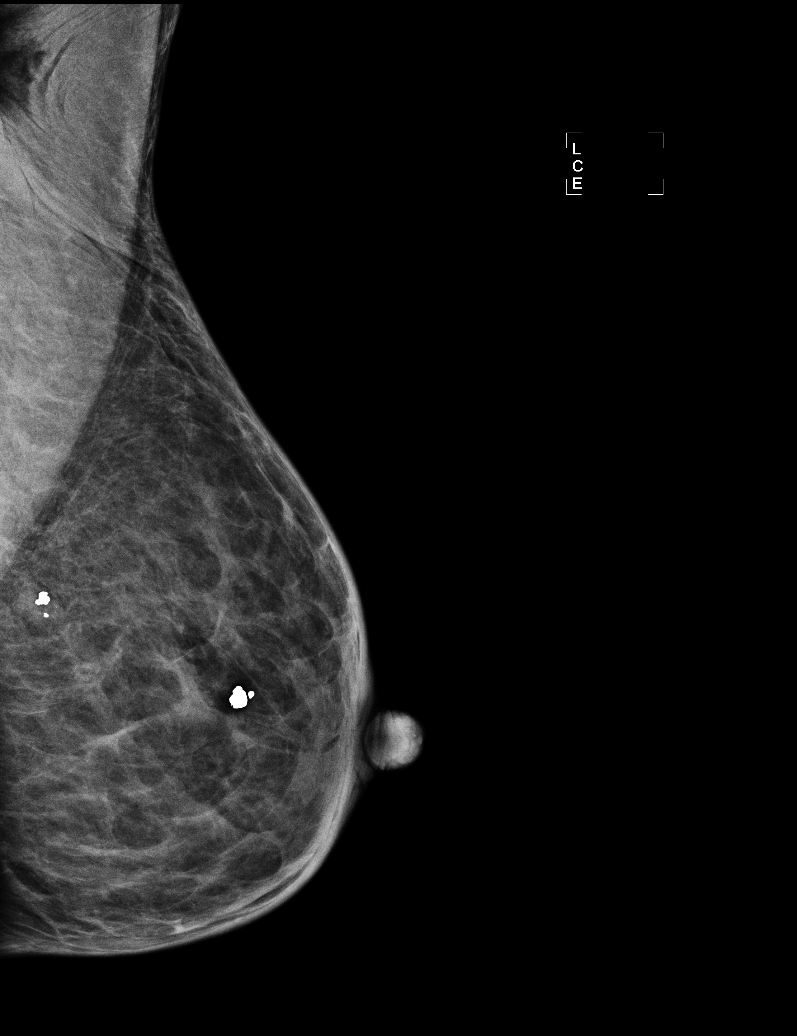

[R MLO]
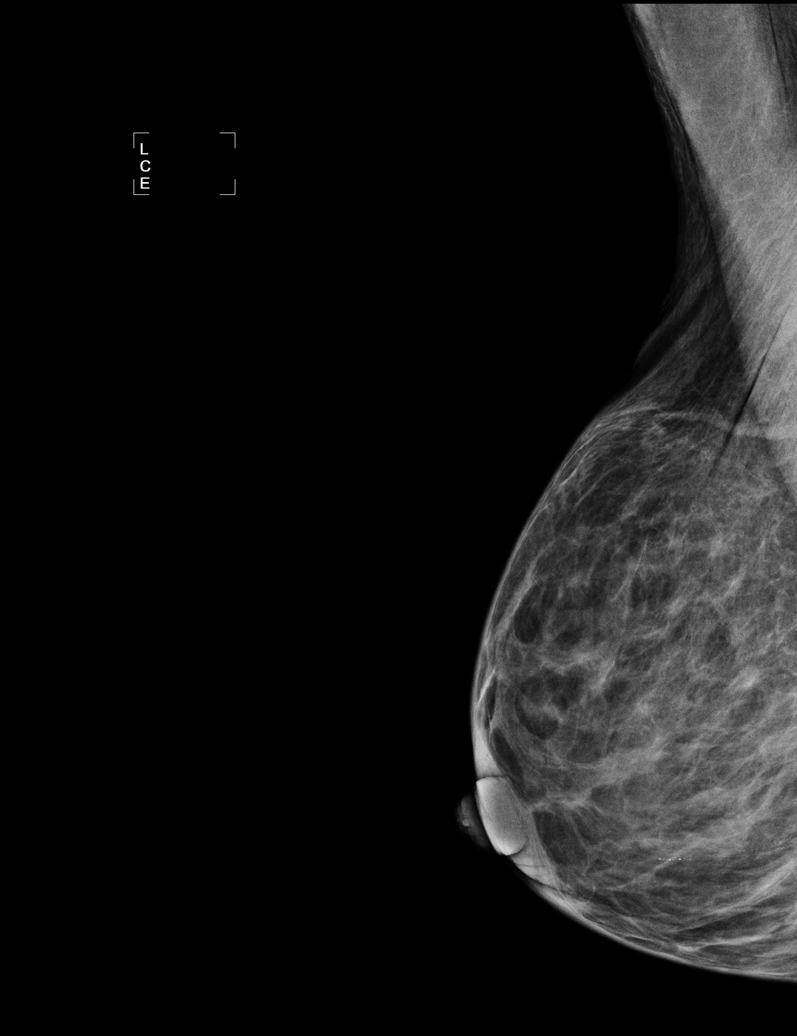

[L CC (2 of 2)]
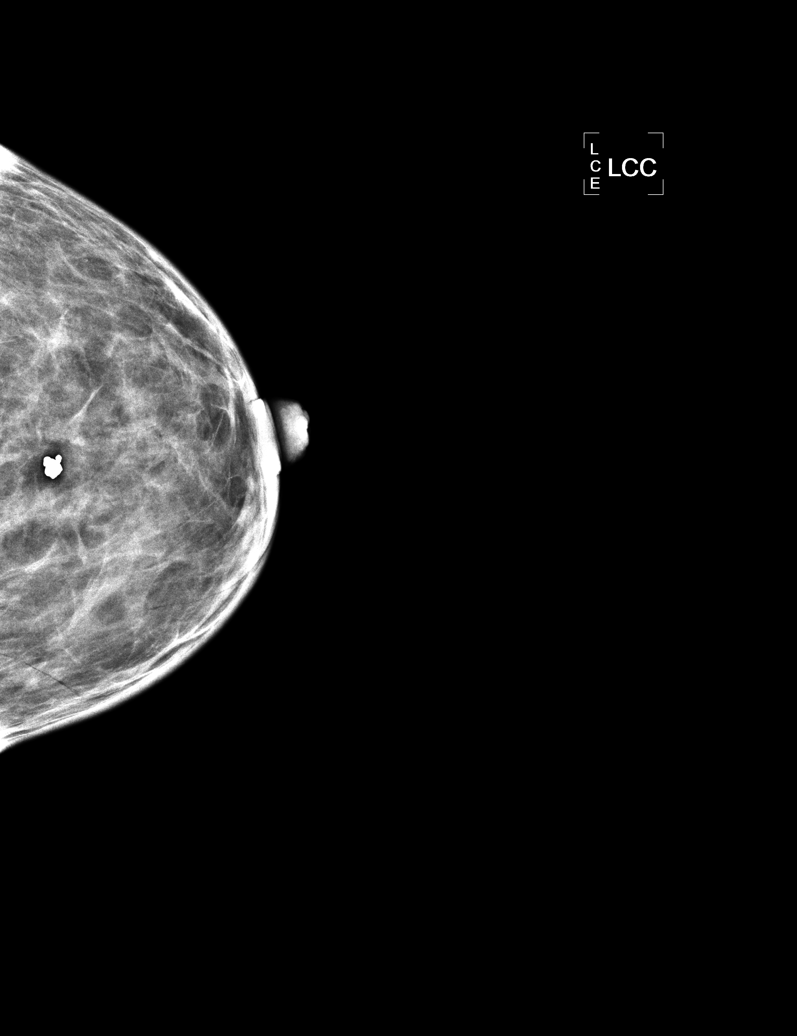

[5 of 5 positions shown; findings below may reference images not displayed]

ACR Breast Density Category d: The breast tissue is extremely dense,
which lowers the sensitivity of mammography.
FINDINGS: In the right breast, calcifications warrant further evaluation with
magnified views. In the left breast, no findings suspicious for
malignancy. Images were processed with CAD.
IMPRESSION: Further evaluation is suggested for calcifications in the right
breast.

RECOMMENDATION:
Diagnostic mammogram of the right breast. (Code:[id number])

The patient will be contacted regarding the findings, and additional
imaging will be scheduled.

BI-RADS CATEGORY  0: Incomplete. Need additional imaging evaluation
and/or prior mammograms for comparison.
